# Patient Record
Sex: Male | Born: 1940 | Race: White | Hispanic: No | Marital: Married | Smoking: Never smoker
Health system: Southern US, Community
[De-identification: ages and names within clinical notes are randomized; demographics above are authoritative.]

## PROBLEM LIST (undated history)

## (undated) DIAGNOSIS — I2510 Atherosclerotic heart disease of native coronary artery without angina pectoris: Secondary | ICD-10-CM

## (undated) DIAGNOSIS — L280 Lichen simplex chronicus: Secondary | ICD-10-CM

## (undated) DIAGNOSIS — G459 Transient cerebral ischemic attack, unspecified: Secondary | ICD-10-CM

## (undated) DIAGNOSIS — Z86010 Personal history of colonic polyps: Secondary | ICD-10-CM

## (undated) DIAGNOSIS — Z951 Presence of aortocoronary bypass graft: Secondary | ICD-10-CM

## (undated) DIAGNOSIS — I679 Cerebrovascular disease, unspecified: Secondary | ICD-10-CM

## (undated) DIAGNOSIS — I10 Essential (primary) hypertension: Secondary | ICD-10-CM

## (undated) DIAGNOSIS — N62 Hypertrophy of breast: Secondary | ICD-10-CM

## (undated) DIAGNOSIS — E663 Overweight: Secondary | ICD-10-CM

## (undated) DIAGNOSIS — M25519 Pain in unspecified shoulder: Secondary | ICD-10-CM

## (undated) DIAGNOSIS — E119 Type 2 diabetes mellitus without complications: Secondary | ICD-10-CM

## (undated) DIAGNOSIS — I639 Cerebral infarction, unspecified: Secondary | ICD-10-CM

## (undated) DIAGNOSIS — L910 Hypertrophic scar: Secondary | ICD-10-CM

## (undated) DIAGNOSIS — E7800 Pure hypercholesterolemia, unspecified: Secondary | ICD-10-CM

## (undated) DIAGNOSIS — I6529 Occlusion and stenosis of unspecified carotid artery: Secondary | ICD-10-CM

## (undated) DIAGNOSIS — N289 Disorder of kidney and ureter, unspecified: Secondary | ICD-10-CM

## (undated) HISTORY — DX: Presence of aortocoronary bypass graft: Z95.1

## (undated) HISTORY — DX: Cerebrovascular disease, unspecified: I67.9

## (undated) HISTORY — DX: Pure hypercholesterolemia, unspecified: E78.00

## (undated) HISTORY — DX: Overweight: E66.3

## (undated) HISTORY — DX: Transient cerebral ischemic attack, unspecified: G45.9

## (undated) HISTORY — DX: Pain in unspecified shoulder: M25.519

## (undated) HISTORY — PX: EYE SURGERY: SHX253

## (undated) HISTORY — PX: CORONARY ANGIOPLASTY: SHX604

## (undated) HISTORY — DX: Disorder of kidney and ureter, unspecified: N28.9

## (undated) HISTORY — DX: Personal history of colonic polyps: Z86.010

## (undated) HISTORY — PX: COLONOSCOPY: SHX174

## (undated) HISTORY — DX: Hypertrophic scar: L91.0

## (undated) HISTORY — DX: Essential (primary) hypertension: I10

## (undated) HISTORY — DX: Lichen simplex chronicus: L28.0

## (undated) HISTORY — DX: Atherosclerotic heart disease of native coronary artery without angina pectoris: I25.10

## (undated) HISTORY — PX: KELOID EXCISION: SHX1856

## (undated) HISTORY — DX: Occlusion and stenosis of unspecified carotid artery: I65.29

## (undated) HISTORY — DX: Hypertrophy of breast: N62

---

## 1988-01-01 HISTORY — PX: PILONIDAL CYST EXCISION: SHX744

## 1998-04-20 ENCOUNTER — Ambulatory Visit (HOSPITAL_COMMUNITY): Admission: RE | Admit: 1998-04-20 | Discharge: 1998-04-20 | Payer: Self-pay | Admitting: *Deleted

## 2001-08-20 ENCOUNTER — Ambulatory Visit (HOSPITAL_COMMUNITY): Admission: RE | Admit: 2001-08-20 | Discharge: 2001-08-20 | Payer: Self-pay | Admitting: Gastroenterology

## 2004-01-16 ENCOUNTER — Inpatient Hospital Stay (HOSPITAL_COMMUNITY): Admission: EM | Admit: 2004-01-16 | Discharge: 2004-01-18 | Payer: Self-pay | Admitting: Internal Medicine

## 2004-01-20 ENCOUNTER — Ambulatory Visit (HOSPITAL_COMMUNITY): Admission: RE | Admit: 2004-01-20 | Discharge: 2004-01-20 | Payer: Self-pay | Admitting: *Deleted

## 2004-02-01 HISTORY — PX: CARDIAC CATHETERIZATION: SHX172

## 2004-02-01 HISTORY — PX: CORONARY ARTERY BYPASS GRAFT: SHX141

## 2004-02-03 ENCOUNTER — Inpatient Hospital Stay (HOSPITAL_COMMUNITY): Admission: RE | Admit: 2004-02-03 | Discharge: 2004-02-09 | Payer: Self-pay | Admitting: Cardiothoracic Surgery

## 2004-02-28 ENCOUNTER — Encounter (HOSPITAL_COMMUNITY): Admission: RE | Admit: 2004-02-28 | Discharge: 2004-05-28 | Payer: Self-pay | Admitting: *Deleted

## 2004-12-19 ENCOUNTER — Ambulatory Visit: Payer: Self-pay | Admitting: *Deleted

## 2004-12-31 DIAGNOSIS — G459 Transient cerebral ischemic attack, unspecified: Secondary | ICD-10-CM

## 2004-12-31 HISTORY — DX: Transient cerebral ischemic attack, unspecified: G45.9

## 2005-01-15 ENCOUNTER — Ambulatory Visit: Payer: Self-pay | Admitting: *Deleted

## 2005-02-21 ENCOUNTER — Ambulatory Visit: Payer: Self-pay | Admitting: Pulmonary Disease

## 2005-03-23 ENCOUNTER — Ambulatory Visit: Payer: Self-pay | Admitting: Pulmonary Disease

## 2005-03-30 ENCOUNTER — Ambulatory Visit: Payer: Self-pay | Admitting: Pulmonary Disease

## 2005-06-26 ENCOUNTER — Ambulatory Visit: Payer: Self-pay | Admitting: Cardiovascular Disease

## 2006-05-24 ENCOUNTER — Ambulatory Visit: Payer: Self-pay | Admitting: Cardiovascular Disease

## 2006-05-31 ENCOUNTER — Ambulatory Visit: Payer: Self-pay | Admitting: Cardiovascular Disease

## 2006-08-29 ENCOUNTER — Ambulatory Visit: Payer: Self-pay | Admitting: Gastroenterology

## 2006-10-08 ENCOUNTER — Ambulatory Visit: Payer: Self-pay | Admitting: Gastroenterology

## 2006-10-08 ENCOUNTER — Encounter (INDEPENDENT_AMBULATORY_CARE_PROVIDER_SITE_OTHER): Payer: Self-pay | Admitting: *Deleted

## 2006-10-08 DIAGNOSIS — Z860100 Personal history of colon polyps, unspecified: Secondary | ICD-10-CM

## 2006-10-08 HISTORY — DX: Personal history of colon polyps, unspecified: Z86.0100

## 2006-10-08 HISTORY — DX: Personal history of colonic polyps: Z86.010

## 2006-11-18 ENCOUNTER — Ambulatory Visit: Payer: Self-pay | Admitting: Pulmonary Disease

## 2007-01-30 ENCOUNTER — Ambulatory Visit: Payer: Self-pay | Admitting: Pulmonary Disease

## 2007-02-13 ENCOUNTER — Encounter: Admission: RE | Admit: 2007-02-13 | Discharge: 2007-02-13 | Payer: Self-pay | Admitting: Surgery

## 2007-05-28 ENCOUNTER — Ambulatory Visit: Payer: Self-pay | Admitting: Cardiovascular Disease

## 2007-05-28 LAB — CONVERTED CEMR LAB
ALT: 31 units/L (ref 0–40)
AST: 28 units/L (ref 0–37)
Albumin: 3.9 g/dL (ref 3.5–5.2)
Alkaline Phosphatase: 70 units/L (ref 39–117)
Bilirubin, Direct: 0.1 mg/dL (ref 0.0–0.3)
Cholesterol: 125 mg/dL (ref 0–200)
HDL: 23.7 mg/dL — ABNORMAL LOW (ref >39.0)
LDL Cholesterol: 67 mg/dL (ref 0–99)
Total Bilirubin: 1.2 mg/dL (ref 0.3–1.2)
Total CHOL/HDL Ratio: 5.3
Total Protein: 7 g/dL (ref 6.0–8.3)
Triglycerides: 171 mg/dL — ABNORMAL HIGH (ref 0–149)
VLDL: 34 mg/dL (ref 0–40)

## 2007-06-19 ENCOUNTER — Ambulatory Visit: Payer: Self-pay | Admitting: Cardiovascular Disease

## 2007-12-01 ENCOUNTER — Telehealth (INDEPENDENT_AMBULATORY_CARE_PROVIDER_SITE_OTHER): Payer: Self-pay | Admitting: *Deleted

## 2007-12-10 ENCOUNTER — Ambulatory Visit: Payer: Self-pay | Admitting: Pulmonary Disease

## 2007-12-16 ENCOUNTER — Ambulatory Visit: Payer: Self-pay | Admitting: Pulmonary Disease

## 2007-12-16 LAB — CONVERTED CEMR LAB
ALT: 26 units/L (ref 0–53)
AST: 24 units/L (ref 0–37)
Albumin: 3.8 g/dL (ref 3.5–5.2)
Alkaline Phosphatase: 72 units/L (ref 39–117)
BUN: 14 mg/dL (ref 6–23)
Basophils Absolute: 0 10*3/uL (ref 0.0–0.1)
Basophils Relative: 0.1 % (ref 0.0–1.0)
Bilirubin Urine: NEGATIVE
Bilirubin, Direct: 0.3 mg/dL (ref 0.0–0.3)
CO2: 31 meq/L (ref 19–32)
CRP, High Sensitivity: 2 (ref 0.00–5.00)
Calcium: 8.9 mg/dL (ref 8.4–10.5)
Chloride: 104 meq/L (ref 96–112)
Cholesterol: 107 mg/dL (ref 0–200)
Creatinine, Ser: 1 mg/dL (ref 0.4–1.5)
Eosinophils Absolute: 0.1 10*3/uL (ref 0.0–0.6)
Eosinophils Relative: 1.6 % (ref 0.0–5.0)
GFR calc Af Amer: 97 mL/min
GFR calc non Af Amer: 80 mL/min
Glucose, Bld: 113 mg/dL — ABNORMAL HIGH (ref 70–99)
HCT: 45.5 % (ref 39.0–52.0)
HDL: 23 mg/dL — ABNORMAL LOW (ref >39.0)
Hemoglobin, Urine: NEGATIVE
Hemoglobin: 15.8 g/dL (ref 13.0–17.0)
Hgb A1c MFr Bld: 5.9 % (ref 4.6–6.0)
Ketones, ur: NEGATIVE mg/dL
LDL Cholesterol: 58 mg/dL (ref 0–99)
Leukocytes, UA: NEGATIVE
Lymphocytes Relative: 23.3 % (ref 12.0–46.0)
MCHC: 34.8 g/dL (ref 30.0–36.0)
MCV: 99.9 fL (ref 78.0–100.0)
Monocytes Absolute: 0.7 10*3/uL (ref 0.2–0.7)
Monocytes Relative: 10.3 % (ref 3.0–11.0)
Neutro Abs: 4.1 10*3/uL (ref 1.4–7.7)
Neutrophils Relative %: 64.7 % (ref 43.0–77.0)
Nitrite: NEGATIVE
PSA: 2.4 ng/mL (ref 0.10–4.00)
Platelets: 171 10*3/uL (ref 150–400)
Potassium: 4 meq/L (ref 3.5–5.1)
RBC: 4.56 M/uL (ref 4.22–5.81)
RDW: 13 % (ref 11.5–14.6)
Sodium: 141 meq/L (ref 135–145)
Specific Gravity, Urine: 1.015 (ref 1.000–1.03)
TSH: 1.93 microintl units/mL (ref 0.35–5.50)
Total Bilirubin: 1.2 mg/dL (ref 0.3–1.2)
Total CHOL/HDL Ratio: 4.7
Total Protein, Urine: NEGATIVE mg/dL
Total Protein: 7 g/dL (ref 6.0–8.3)
Triglycerides: 131 mg/dL (ref 0–149)
Urine Glucose: NEGATIVE mg/dL
Urobilinogen, UA: 0.2 (ref 0.0–1.0)
VLDL: 26 mg/dL (ref 0–40)
WBC: 6.4 10*3/uL (ref 4.5–10.5)
pH: 6 (ref 5.0–8.0)

## 2008-01-10 DIAGNOSIS — R7301 Impaired fasting glucose: Secondary | ICD-10-CM

## 2008-01-10 DIAGNOSIS — E119 Type 2 diabetes mellitus without complications: Secondary | ICD-10-CM | POA: Insufficient documentation

## 2008-01-10 DIAGNOSIS — L910 Hypertrophic scar: Secondary | ICD-10-CM | POA: Insufficient documentation

## 2008-01-10 DIAGNOSIS — I10 Essential (primary) hypertension: Secondary | ICD-10-CM | POA: Insufficient documentation

## 2008-01-10 DIAGNOSIS — I872 Venous insufficiency (chronic) (peripheral): Secondary | ICD-10-CM | POA: Insufficient documentation

## 2008-01-10 DIAGNOSIS — D126 Benign neoplasm of colon, unspecified: Secondary | ICD-10-CM | POA: Insufficient documentation

## 2008-01-10 DIAGNOSIS — M25519 Pain in unspecified shoulder: Secondary | ICD-10-CM | POA: Insufficient documentation

## 2008-01-10 DIAGNOSIS — I2510 Atherosclerotic heart disease of native coronary artery without angina pectoris: Secondary | ICD-10-CM | POA: Insufficient documentation

## 2008-01-10 DIAGNOSIS — N62 Hypertrophy of breast: Secondary | ICD-10-CM | POA: Insufficient documentation

## 2008-01-10 DIAGNOSIS — L280 Lichen simplex chronicus: Secondary | ICD-10-CM | POA: Insufficient documentation

## 2008-01-29 ENCOUNTER — Telehealth: Payer: Self-pay | Admitting: Pulmonary Disease

## 2008-03-11 ENCOUNTER — Telehealth: Payer: Self-pay | Admitting: Pulmonary Disease

## 2008-04-30 HISTORY — PX: KELOID EXCISION: SHX1856

## 2008-06-02 ENCOUNTER — Ambulatory Visit: Payer: Self-pay | Admitting: Cardiovascular Disease

## 2008-06-02 LAB — CONVERTED CEMR LAB
ALT: 27 units/L (ref 0–53)
AST: 28 units/L (ref 0–37)
Albumin: 3.7 g/dL (ref 3.5–5.2)
Alkaline Phosphatase: 68 units/L (ref 39–117)
Bilirubin, Direct: 0.1 mg/dL (ref 0.0–0.3)
Cholesterol: 107 mg/dL (ref 0–200)
HDL: 21.9 mg/dL — ABNORMAL LOW (ref >39.0)
LDL Cholesterol: 56 mg/dL (ref 0–99)
Total Bilirubin: 1.2 mg/dL (ref 0.3–1.2)
Total CHOL/HDL Ratio: 4.9
Total Protein: 6.6 g/dL (ref 6.0–8.3)
Triglycerides: 144 mg/dL (ref 0–149)
VLDL: 29 mg/dL (ref 0–40)

## 2008-06-07 ENCOUNTER — Ambulatory Visit: Payer: Self-pay | Admitting: Cardiovascular Disease

## 2009-01-27 ENCOUNTER — Telehealth: Payer: Self-pay | Admitting: Pulmonary Disease

## 2009-01-31 ENCOUNTER — Ambulatory Visit: Payer: Self-pay | Admitting: Pulmonary Disease

## 2009-02-03 ENCOUNTER — Ambulatory Visit: Payer: Self-pay | Admitting: Pulmonary Disease

## 2009-02-04 DIAGNOSIS — E663 Overweight: Secondary | ICD-10-CM | POA: Insufficient documentation

## 2009-02-04 LAB — CONVERTED CEMR LAB
ALT: 28 units/L (ref 0–53)
AST: 27 units/L (ref 0–37)
Albumin: 3.9 g/dL (ref 3.5–5.2)
Alkaline Phosphatase: 74 units/L (ref 39–117)
BUN: 13 mg/dL (ref 6–23)
Basophils Absolute: 0 10*3/uL (ref 0.0–0.1)
Basophils Relative: 0.3 % (ref 0.0–3.0)
Bilirubin Urine: NEGATIVE
Bilirubin, Direct: 0.2 mg/dL (ref 0.0–0.3)
CO2: 31 meq/L (ref 19–32)
CRP, High Sensitivity: <1 — ABNORMAL LOW (ref 0.00–5.00)
Calcium: 9.1 mg/dL (ref 8.4–10.5)
Chloride: 103 meq/L (ref 96–112)
Cholesterol: 119 mg/dL (ref 0–200)
Creatinine, Ser: 0.9 mg/dL (ref 0.4–1.5)
Eosinophils Absolute: 0.1 10*3/uL (ref 0.0–0.7)
Eosinophils Relative: 1.3 % (ref 0.0–5.0)
GFR calc Af Amer: 109 mL/min
GFR calc non Af Amer: 90 mL/min
Glucose, Bld: 114 mg/dL — ABNORMAL HIGH (ref 70–99)
HCT: 49.1 % (ref 39.0–52.0)
HDL: 24.1 mg/dL — ABNORMAL LOW (ref >39.0)
Hemoglobin, Urine: NEGATIVE
Hemoglobin: 17 g/dL (ref 13.0–17.0)
Ketones, ur: NEGATIVE mg/dL
LDL Cholesterol: 63 mg/dL (ref 0–99)
Leukocytes, UA: NEGATIVE
Lymphocytes Relative: 20.1 % (ref 12.0–46.0)
MCHC: 34.6 g/dL (ref 30.0–36.0)
MCV: 101.7 fL — ABNORMAL HIGH (ref 78.0–100.0)
Monocytes Absolute: 0.7 10*3/uL (ref 0.1–1.0)
Monocytes Relative: 10.2 % (ref 3.0–12.0)
Neutro Abs: 5 10*3/uL (ref 1.4–7.7)
Neutrophils Relative %: 68.1 % (ref 43.0–77.0)
Nitrite: NEGATIVE
PSA: 1.16 ng/mL (ref 0.10–4.00)
Platelets: 183 10*3/uL (ref 150–400)
Potassium: 4.3 meq/L (ref 3.5–5.1)
RBC: 4.83 M/uL (ref 4.22–5.81)
RDW: 13.1 % (ref 11.5–14.6)
Sodium: 139 meq/L (ref 135–145)
Specific Gravity, Urine: 1.015 (ref 1.000–1.03)
TSH: 2.56 microintl units/mL (ref 0.35–5.50)
Total Bilirubin: 1 mg/dL (ref 0.3–1.2)
Total CHOL/HDL Ratio: 4.9
Total Protein, Urine: NEGATIVE mg/dL
Total Protein: 7.1 g/dL (ref 6.0–8.3)
Triglycerides: 158 mg/dL — ABNORMAL HIGH (ref 0–149)
Urine Glucose: 100 mg/dL — AB
Urobilinogen, UA: 0.2 (ref 0.0–1.0)
VLDL: 32 mg/dL (ref 0–40)
Vit D, 1,25-Dihydroxy: 14 — ABNORMAL LOW (ref 30–89)
WBC: 7.2 10*3/uL (ref 4.5–10.5)
pH: 6 (ref 5.0–8.0)

## 2009-06-01 ENCOUNTER — Ambulatory Visit: Payer: Self-pay | Admitting: Cardiovascular Disease

## 2009-06-03 ENCOUNTER — Inpatient Hospital Stay (HOSPITAL_COMMUNITY): Admission: EM | Admit: 2009-06-03 | Discharge: 2009-06-04 | Payer: Self-pay | Admitting: Emergency Medicine

## 2009-06-03 ENCOUNTER — Ambulatory Visit: Payer: Self-pay | Admitting: Pulmonary Disease

## 2009-06-08 ENCOUNTER — Ambulatory Visit: Payer: Self-pay | Admitting: Vascular Surgery

## 2009-06-27 ENCOUNTER — Telehealth: Payer: Self-pay | Admitting: Pulmonary Disease

## 2009-06-30 ENCOUNTER — Ambulatory Visit: Payer: Self-pay | Admitting: Pulmonary Disease

## 2009-06-30 DIAGNOSIS — G459 Transient cerebral ischemic attack, unspecified: Secondary | ICD-10-CM | POA: Insufficient documentation

## 2009-06-30 DIAGNOSIS — I639 Cerebral infarction, unspecified: Secondary | ICD-10-CM | POA: Insufficient documentation

## 2009-06-30 DIAGNOSIS — I679 Cerebrovascular disease, unspecified: Secondary | ICD-10-CM | POA: Insufficient documentation

## 2009-11-17 ENCOUNTER — Ambulatory Visit: Payer: Self-pay | Admitting: Pulmonary Disease

## 2009-11-20 DIAGNOSIS — E559 Vitamin D deficiency, unspecified: Secondary | ICD-10-CM | POA: Insufficient documentation

## 2009-11-20 LAB — CONVERTED CEMR LAB
BUN: 13 mg/dL (ref 6–23)
CO2: 30 meq/L (ref 19–32)
Calcium: 9.3 mg/dL (ref 8.4–10.5)
Chloride: 105 meq/L (ref 96–112)
Cholesterol: 115 mg/dL (ref 0–200)
Creatinine, Ser: 0.9 mg/dL (ref 0.4–1.5)
GFR calc non Af Amer: 89.73 mL/min (ref >60)
Glucose, Bld: 109 mg/dL — ABNORMAL HIGH (ref 70–99)
HDL: 27.9 mg/dL — ABNORMAL LOW (ref >39.00)
Hgb A1c MFr Bld: 5.9 % (ref 4.6–6.5)
LDL Cholesterol: 62 mg/dL (ref 0–99)
Potassium: 4.4 meq/L (ref 3.5–5.1)
Sodium: 142 meq/L (ref 135–145)
Total CHOL/HDL Ratio: 4
Triglycerides: 128 mg/dL (ref 0.0–149.0)
VLDL: 25.6 mg/dL (ref 0.0–40.0)

## 2010-05-08 ENCOUNTER — Telehealth: Payer: Self-pay | Admitting: Pulmonary Disease

## 2010-05-10 ENCOUNTER — Ambulatory Visit: Payer: Self-pay | Admitting: Pulmonary Disease

## 2010-05-16 ENCOUNTER — Ambulatory Visit: Payer: Self-pay | Admitting: Pulmonary Disease

## 2010-05-21 LAB — CONVERTED CEMR LAB
ALT: 30 units/L (ref 0–53)
AST: 32 units/L (ref 0–37)
Albumin: 4 g/dL (ref 3.5–5.2)
Alkaline Phosphatase: 65 units/L (ref 39–117)
BUN: 21 mg/dL (ref 6–23)
Basophils Absolute: 0 10*3/uL (ref 0.0–0.1)
Basophils Relative: 0.3 % (ref 0.0–3.0)
Bilirubin, Direct: 0.2 mg/dL (ref 0.0–0.3)
CO2: 31 meq/L (ref 19–32)
CRP, High Sensitivity: 0.6 (ref 0.00–5.00)
Calcium: 9.2 mg/dL (ref 8.4–10.5)
Chloride: 104 meq/L (ref 96–112)
Cholesterol: 119 mg/dL (ref 0–200)
Creatinine, Ser: 0.9 mg/dL (ref 0.4–1.5)
Eosinophils Absolute: 0.1 10*3/uL (ref 0.0–0.7)
Eosinophils Relative: 1.8 % (ref 0.0–5.0)
GFR calc non Af Amer: 93.17 mL/min (ref >60)
Glucose, Bld: 107 mg/dL — ABNORMAL HIGH (ref 70–99)
HCT: 46.4 % (ref 39.0–52.0)
HDL: 27.2 mg/dL — ABNORMAL LOW (ref >39.00)
Hemoglobin: 16.1 g/dL (ref 13.0–17.0)
LDL Cholesterol: 63 mg/dL (ref 0–99)
Lymphocytes Relative: 23.8 % (ref 12.0–46.0)
Lymphs Abs: 1.5 10*3/uL (ref 0.7–4.0)
MCHC: 34.6 g/dL (ref 30.0–36.0)
MCV: 101.2 fL — ABNORMAL HIGH (ref 78.0–100.0)
Monocytes Absolute: 0.6 10*3/uL (ref 0.1–1.0)
Monocytes Relative: 10 % (ref 3.0–12.0)
Neutro Abs: 4.1 10*3/uL (ref 1.4–7.7)
Neutrophils Relative %: 64.1 % (ref 43.0–77.0)
PSA: 1.24 ng/mL (ref 0.10–4.00)
Platelets: 185 10*3/uL (ref 150.0–400.0)
Potassium: 4.6 meq/L (ref 3.5–5.1)
RBC: 4.59 M/uL (ref 4.22–5.81)
RDW: 14.1 % (ref 11.5–14.6)
Sodium: 141 meq/L (ref 135–145)
TSH: 2.25 microintl units/mL (ref 0.35–5.50)
Total Bilirubin: 1.3 mg/dL — ABNORMAL HIGH (ref 0.3–1.2)
Total CHOL/HDL Ratio: 4
Total Protein: 6.8 g/dL (ref 6.0–8.3)
Triglycerides: 144 mg/dL (ref 0.0–149.0)
VLDL: 28.8 mg/dL (ref 0.0–40.0)
WBC: 6.4 10*3/uL (ref 4.5–10.5)

## 2010-06-02 ENCOUNTER — Ambulatory Visit: Payer: Self-pay | Admitting: Cardiovascular Disease

## 2010-06-09 ENCOUNTER — Ambulatory Visit: Payer: Self-pay | Admitting: Vascular Surgery

## 2010-11-14 ENCOUNTER — Ambulatory Visit: Payer: Self-pay | Admitting: Pulmonary Disease

## 2010-11-14 LAB — CONVERTED CEMR LAB
BUN: 17 mg/dL (ref 6–23)
CO2: 29 meq/L (ref 19–32)
Calcium: 8.6 mg/dL (ref 8.4–10.5)
Chloride: 104 meq/L (ref 96–112)
Cholesterol: 119 mg/dL (ref 0–200)
Creatinine, Ser: 0.9 mg/dL (ref 0.4–1.5)
GFR calc non Af Amer: 85.08 mL/min (ref >60)
Glucose, Bld: 108 mg/dL — ABNORMAL HIGH (ref 70–99)
HDL: 30.7 mg/dL — ABNORMAL LOW (ref >39.00)
LDL Cholesterol: 66 mg/dL (ref 0–99)
Potassium: 4.3 meq/L (ref 3.5–5.1)
Sodium: 138 meq/L (ref 135–145)
Total CHOL/HDL Ratio: 4
Triglycerides: 113 mg/dL (ref 0.0–149.0)
VLDL: 22.6 mg/dL (ref 0.0–40.0)

## 2011-01-20 ENCOUNTER — Encounter: Payer: Self-pay | Admitting: Cardiothoracic Surgery

## 2011-02-01 NOTE — Progress Notes (Signed)
Summary: Plastic Surgeon referral needed  Phone Note Call from Patient Call back at Home Phone [phone]   Caller: Patient Reason for Call: Talk to Nurse Summary of Call: patient said last year when he seen dr young, he told him that he may need to go to a plastic surgeon to get the scar on his chest looked at. Dr young told him he would reffer him if he needed to. and he was now wanting a refferal.  cr Initial call taken by: Valinda Hoar,  January 29, 2008 1:03 PM  Follow-up for Phone Call        [facility] with wife.  Follow-up by: Marinus Maw,  January 29, 2008 1:31 PM  Additional Follow-up for Phone Call Additional follow up Details #1::        Wants plastic surgeon referral. [facility] to set up. Additional Follow-up by: Marinus Maw,  January 29, 2008 1:47 PM    Additional Follow-up for Phone Call Additional follow up Details #2::    Pt is a pt of dr Kriste Basque has never seen dr young. Dr. Kriste Basque states it was ok to refer for plastic surgery eval. Appt scheduled for 02/18/08 at 10:30 with Dr. Stephens November. Mailed appt card and dr Stephens November information. Pt aware of appt. Follow-up by: Alfonso Ramus,  January 30, 2008 9:41 AM

## 2011-02-01 NOTE — Assessment & Plan Note (Signed)
Summary: f1y/dm  Medications Added DIOVAN 160 MG TABS (VALSARTAN) take one tablet once daily      Allergies Added:   CC:  1 yr follow up.  History of Present Illness: [name] [name] seen today in followup.  His known coronary disease with previous coronary bypass surgery in 2005.  he has normal LV function.  His risk factors will modify.  Not having significant chest pain PND orthopnea is really active.  He has 3 young grandchildren and is quite active with him.  He was at the [facility] baseball tournament this weekend.  She is Dr. Elroy Channel for his primary care needs.  He had a recent physical which is fine.  Blood pressure medicine has been switched to Diovan do to insurance reasons.  He is taking an aspirin a day.   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 Take 1 Tablet By Mouth Once A Day 2)  Lipitor 20 Mg Tabs (Atorvastatin Calcium) .... Take As Directed... 3)  Viagra 100 Mg  Tabs (Sildenafil Citrate) .... Use As Directed 4)  Diovan 160 Mg Tabs (Valsartan) .... Take One Tablet Once Daily  Allergies (verified): 1)  ! Niacin (Niacin)  Past History:  Past Medical History: Last updated: 02/03/2009  HYPERTENSION (ICD-401.9) CORONARY ARTERY DISEASE (ICD-414.00) VENOUS INSUFFICIENCY (ICD-459.81) HYPERCHOLESTEROLEMIA (ICD-272.0) DIABETES MELLITUS, BORDERLINE (ICD-790.29) OVERWEIGHT (ICD-278.02) COLONIC POLYPS (ICD-211.3) SHOULDER PAIN (ICD-719.41) GYNECOMASTIA,  UNILATERAL (ICD-611.1) KELOID (ICD-701.4) NEURODERMATITIS (ICD-698.3)  Past Surgical History: Last updated: 02/03/2009  S/P Pilonidal Cystectomy 1989 S/P 4 Vessel CABG 2/05 by DrVanTrigt S/P keloid surg on chest scar 5/09 by DrHolderness  Family History: Last updated: 06/29/09  Father died of lung cancer at age 68.  Mother is alive at  age 68, MI at 68, has two brothers, one is a diabetic, one sister.  Social History: Last updated: 2009-06-29  The patient is married.  He is retired, has one child, does  not smoke, does not drink.  Review of Systems       Denies fever, malais, weight loss, blurry vision, decreased visual acuity, cough, sputum, SOB, hemoptysis, pleuritic pain, palpitaitons, heartburn, abdominal pain, melena, lower extremity edema, claudication, or rash. All other systems reviewed and negative  Vital Signs:  Patient profile:   70 year old male Height:      74 inches Weight:      237 pounds BMI:     30.54 Pulse rate:   84 / minute Pulse rhythm:   regular BP sitting:   144 / 76  (left arm) Cuff size:   large  Vitals Entered By: Judithe Modest CMA 06/29/09 10:40 AM)  Physical Exam  General:  Affect appropriate Healthy:  appears stated age HEENT: normal Neck supple with no adenopathy JVP normal no bruits no thyromegaly Lungs clear with no wheezing and good diaphragmatic motion Heart:  S1/S2 no murmur,rub, gallop or click PMI normal Abdomen: benighn, BS positve, no tenderness, no AAA no bruit.  No HSM or HJR Distal pulses intact with no bruits No edema Neuro non-focal  Skin warm and dry    Impression & Recommendations:  Problem # 1:  CORONARY ARTERY DISEASE (ICD-414.00) CABG 2005.  stable no angina His updated medication list for this problem includes:    Adult Aspirin Low Strength 81 Mg Tbdp (Aspirin) .[name]... Take 1 tablet by mouth once a day  Orders: EKG w/ Interpretation (93000)  Problem # 2:  HYPERTENSION (ICD-401.9) Will update  med list and stop avapro The following medications were removed from the medication list:    Avapro 300 Mg Tabs (Irbesartan) .[name]... Take 1 tablet by mouth once a day His updated medication list for this problem includes:    Adult Aspirin Low Strength 81 Mg Tbdp (Aspirin) .[name]... Take 1 tablet by mouth once a day    Diovan 160 Mg Tabs (Valsartan) .[name]... Take one tablet once daily  Problem # 3:  HYPERCHOLESTEROLEMIA (ICD-272.0) At target with no side effects His updated medication list for this problem includes:    Lipitor 20 Mg Tabs (Atorvastatin calcium) .[name]... Take as directed...  CHOL: 119 (01/31/2009)   LDL: 63 (01/31/2009)   HDL: 24.1 (01/31/2009)   TG: 158 (01/31/2009) CRP: < 1.00 mg/L (01/31/2009)     Patient Instructions: 1)  F/U Nishan 1 year

## 2011-02-01 NOTE — Progress Notes (Signed)
Summary: rx sub  Phone Note Call from Patient   Caller: Patient Call For: nadel Summary of Call: req to speak to t. davis re: sub for toprol. says [facility] [address] [facility] not received a response to their "many" faxes.   Initial call taken by: Tivis Ringer,  March 11, 2008 1:17 PM  Follow-up for Phone Call        Pt was taking toprol xl 50 mg 1/2 once daily  [facility] [address].  Please advise  Follow-up by: Vernie Murders,  March 11, 2008 3:01 PM  Additional Follow-up for Phone Call Additional follow up Details #1::        per SN  we will change pt to atenolol 50mg .  this has been sent to his pharmacy and he is aware. Additional Follow-up by: Marijo File CMA,  March 11, 2008 3:18 PM    New/Updated Medications: ATENOLOL 50 MG  TABS (ATENOLOL) take as directed   Prescriptions: ATENOLOL 50 MG  TABS (ATENOLOL) take as directed  #30 x 5   Entered by:   Marijo File CMA   Authorized by:   Michele Mcalpine MD   Signed by:   Marijo File CMA on 03/11/2008   Method used:   Electronically [facility] to ...       [facility]  [address] [id number]       [address]       [place], [place]  [address]       Ph: [phone] or [phone]       Fax: [phone]   RxID:   [id number]

## 2011-02-01 NOTE — Progress Notes (Signed)
Summary: set up labs asap   Phone Note Call from Patient   Caller: Patient Call For: nadel Summary of Call: please set up labs for pt's cpx. he would like to come in asap to have these done. please add CRP test per pt.  cpx is scheduled for 02/03/09. [phone].  Initial call taken by: Tivis Ringer,  January 27, 2009 2:24 PM  Follow-up for Phone Call        Please advise what labs to order and I will put in idx Vernie Murders  January 27, 2009 2:37 PM   Additional Follow-up for Phone Call Additional follow up Details #1::        Pt coming in on Monday Feb 1st for lab work. Reynaldo Minium CMA  January 27, 2009 4:47 PM   Orders in IDX.

## 2011-02-01 NOTE — Assessment & Plan Note (Signed)
Summary: 6 months/apc   CC:  6 month ROV & review of mult medical problems....  History of Present Illness: 70 y/o WM here for a follow up visit... he has multiple medical problems as noted below...     ~  followed by [name] for Cardiology- CAD, s/p CABG... he stopped his Atenolol due to side effects and incr the Avapro to 300mg /d... pt feels much better off the BBlocker...  ~  May09:  he had keloid surg by [facility] for the large keloid on the sternal wound... now much improved and he is quite satisfied...   ~  Feb10: doing well overall x difficult time dieting and exercising- as a result weight is the same, & HDL Chol is still low @ 24... feeling better off the BBlocker and BP is good on the incr dose of Avapro...  ~  Jun10:  Hosp w/ TIA w/ left face & arm symptoms that resolved quickly... CT showed small vessel disease;  MRI showed bilat remote lacunar infarcts, no acute infarct, +sm vessel dis;  MRA showed intracranial atherosclerotic changes, & ?focal stenosis (?70%) of left ICA in the neck... 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  he states that he stopped the Plavix after just several days due to swelling in his feet that resolved off the Plavix Rx- we discussed this and I strongly rec that he restart the Plavix due to his recent TIA...  ~  Nov10:  he is taking his ASA + PLAX and stable... he notes BP well controlled on Rx w/ home BP monitoring & no CP, palpit, SOB, etc... he's already had the 2010 flu vaccine & due for f/u labs today.   ~  May 16, 2010:  still not dieting or exercising & wt stable  ~233#> we discussed needed diet + exercise program for weight reduction... BP controlled on the Diovan;  denies angina etc & due for f/u DrNishan soon;  he continues on the ASA/ Plavix w/o TIAs etc;  FLP looks good on Lip10 x low HDL & rec for incr exercise...    Current Problems:   HEALTH  MAINTENANCE -  he had TETANUS & PNEUMOVAX 10/00... we will go ahead w/ his f/u PNEUMOVAX at age 89 now... had the 2010 flu vaccine 10/10... he is up-to-date on his colonoscopy and prostate checks...  HYPERTENSION (ICD-401.9) - controlled on DIOVAN 160mg /d... BP's ave 130's/70's at home and measures 140/78 here today... he takes med regularly & tol well... denies HA, fatigue, visual changes, CP, palipit, dizziness, syncope, dyspnea, edema, etc...  ~  Jun09:  prev on Aten25mg - stopped due to side effects & feels much better off BBlockers.  ~  2/10:  Avapro300 changed to Diovan160 per his insurance company for $$$ reasons.  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 followed by [name] & last seen 6/10- note reviewed, stable, but adm 2d later w/ TIA as above... currently denies angina, palpit, SOB, edema, etc...  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 followed by [name].  VENOUS INSUFFICIENCY (ICD-459.81) - he is aware of need to elim salt, elevate legs, wear support hose.  HYPERCHOLESTEROLEMIA (ICD-272.0) - controlled on LIPITOR 20- 1/2 tab daily... he has tried NIACIN in the past and refuses to take it again due to headaches.  ~  FLP 5/08 showed TChol 125, TG 171, HDL 24, LDL 67...   ~  FLP 12/08 showed TChol 107, TG 131, HDL 23, LDL 58... rec- same med, better diet & exercise.  ~  FLP 2/10 on Lip10 (wt=235#) showed TChol 119, TG 158, HDL 24, LDL 63... rec- ditto  ~  FLP 11/10 on Lip10 (wt=236#) showed TChol 115, TG 128, HDL 28, LDL 62  ~  FLP 5/11 on Lip10 (wt=233#) showed TChol 119, TG 144, HDL 27, LDL 63  DIABETES MELLITUS, BORDERLINE (ICD-790.29) - FBS in the 110-125 range... diet Rx... he knows that there are diabetic meds in his future if he doesn't get the weight down!!!  ~  labs in hosp 6/10 showed BS= 109-113  ~  labs 11/10 showed BS= 109, A1c= 5.9  ~  labs 5/11 showed BS= 107  OVERWEIGHT (ICD-278.02) -  he is 6'2" tall and weighs  ~240# for a BMI of 31... we have discussed diet & exercise stategies.  COLONIC POLYPS (ICD-211.3) - last colonoscopy 10/07 by [name] showed several 5-6 mm polyps... path= tubular adenomas ... f/u planned 3 years.  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 Rx w/ PT.  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  ~  labs 2/10 showed Vit D level = 14... rec to start 2000 u daily.  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 he had plastic surg by [facility] 5/09 w/ improved scar.  NEURODERMATITIS (ICD-698.3)   Allergies: 1)  ! Niacin (Niacin)  Comments:  Nurse/Medical Assistant: The patient's medications and allergies were reviewed with the patient and were updated in the Medication and Allergy Lists.  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 Father died of lung cancer at age 21. Mother is alive at age 41, MI at 4, has two brothers, one is a diabetic, one sister.  Social History: Reviewed history from 06/30/2009 and no changes required. The patient  is married. He is retired,  has one child,  never smoked---dipped while playing ball years ago  does not drink.  Review of Systems      See HPI  The patient denies anorexia, fever, weight loss, weight gain, vision loss, decreased hearing, hoarseness, chest pain, syncope, dyspnea on exertion, peripheral edema, prolonged cough, headaches, hemoptysis, abdominal pain, melena, hematochezia, severe indigestion/heartburn, hematuria, incontinence, muscle weakness, suspicious skin lesions, transient blindness, difficulty walking, depression, unusual weight change, abnormal bleeding, enlarged lymph nodes, and angioedema.    Vital Signs:  Patient profile:   70 year old male Height:      74 inches Weight:      233 pounds BMI:     30.02 O2 Sat:      98 % on Room air Temp:     97.6 degrees F oral Pulse rate:   75 / minute BP sitting:   140 / 78  (left arm) Cuff size:   regular  Vitals Entered By: Randell Loop CMA (May 16, 2010 9:55 AM)  O2 Sat at Rest %:  98 O2 Flow:  Room air CC: 6 month ROV & review of mult medical problems... Is Patient Diabetic? No Pain Assessment Patient in pain? no      Comments meds updated today   Physical Exam  Additional Exam:  WD, Overweight, 70 y/o WM in NAD... GENERAL:  Alert & oriented; pleasant & cooperative. HEENT:  [place]/AT, EOM-wnl, PERRLA, Fundi-benign, EACs-clear, TMs-wnl, NOSE-clear, THROAT-clear & wnl. NECK:  Supple w/ fairROM; no JVD; normal carotid impulses w/o bruits; no thyromegaly or nodules palpated; no lymphadenopathy. CHEST:  Clear to P & A; without wheezes/ rales/ or rhonchi. HEART:  Median sternotomy scar w/ improved keloid, regular rhythm; without murmurs/ rubs/ or gallops. ABDOMEN:  Soft & nontender; normal bowel sounds; no organomegaly or masses detected. 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 EXT: without deformities, mild arthritic changes; + venous insuffic changes, no edema. NEURO:  CN's intact; motor testing normal;  sensory testing normal; gait normal & balance OK. DERM:  Keloid as noted, no other lesions...    MISC. Report  Procedure date:  05/10/2010  Findings:      Lipid Panel (LIPID)   Cholesterol               119 mg/dL                   2-956   Triglycerides             144.0 mg/dL                 2.1-308.6   HDL                  [L]  57.84 mg/dL                 >69.62   LDL Cholesterol           63 mg/dL                    9-52  BMP (METABOL)   Sodium                    141 mEq/L                   135-145   Potassium                 4.6 mEq/L                   3.5-5.1   Chloride                  104 mEq/L                   96-112   Carbon Dioxide            31 mEq/L                    19-32   Glucose              [H]  107 mg/dL                   84-13   BUN                       21 mg/dL  6-23   Creatinine                0.9 mg/dL                   4.7-8.2   Calcium                   9.2 mg/dL                   9.5-62.1   GFR                       93.17 mL/min                >60   Hepatic/Liver Function Panel (HEPATIC)   Total Bilirubin      [H]  1.3 mg/dL                   3.0-8.6   Direct Bilirubin          0.2 mg/dL                   5.7-8.4   Alkaline Phosphatase      65 U/L                      39-117   AST                       32 U/L                      0-37   ALT                       30 U/L                      0-53   Total Protein             6.8 g/dL                    6.9-6.2   Albumin                   4.0 g/dL                    9.5-2.8  Comments:      CBC Platelet w/Diff (CBCD)   White Cell Count          6.4 K/uL                    4.5-10.5   Red Cell Count            4.59 Mil/uL                 4.22-5.81   Hemoglobin                16.1 g/dL                   41.3-24.4   Hematocrit                46.4 %                      39.0-52.0   MCV                  [H]  101.2 fl  78.0-100.0   Platelet Count            185.0 K/uL                   150.0-400.0   Neutrophil %              64.1 %                      43.0-77.0   Lymphocyte %              23.8 %                      12.0-46.0   Monocyte %                10.0 %                      3.0-12.0   Eosinophils%              1.8 %                       0.0-5.0   Basophils %               0.3 %                       0.0-3.0  TSH (TSH)   FastTSH                   2.25 uIU/mL                 0.35-5.50  Prostate Specific Antigen (PSA)   PSA-Hyb                   1.24 ng/mL                  0.10-4.00   Full Range CRP (FCRP)   CRPH                      0.60 mg/L                   0.00-5.00   Impression & Recommendations:  Problem # 1:  HYPERTENSION (ICD-401.9) BP controlled on med... needs better diet, get weight down, etc... His updated medication list for this problem includes:    Diovan 160 Mg Tabs (Valsartan) .[name]... Take one tablet once daily  Orders: Prescription Created Electronically [phone])  Problem # 2:  CORONARY ARTERY DISEASE (ICD-414.00) Doing satis> no angina, due for f/u w/ drNishan soon... His updated medication list for this problem includes:    Adult Aspirin Low Strength 81 Mg Tbdp (Aspirin) .[name]... Take 1 tablet by mouth once a day    Plavix 75 Mg Tabs (Clopidogrel bisulfate) .[name]... Take 1 tab by mouth once daily...    Diovan 160 Mg Tabs (Valsartan) .[name]... 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 continue same... followed by Lyla Son.  Problem # 4:  HYPERCHOLESTEROLEMIA (ICD-272.0) FLP looks OK on Lip10 but HDL is low> needs better diet & exer (intol to Niacin). His updated medication list for this problem includes:    Lipitor 20 Mg Tabs (Atorvastatin calcium) .[name]... Take as directed...  Problem # 5:  DIABETES MELLITUS, BORDERLINE (ICD-790.29) Controlled on diet alone...  Problem # 6:  OVERWEIGHT (ICD-278.02) Must get on  diet, incr exercise, get weight down...  Problem # 7:  OTHER  MEDICAL PROBLEMS AS NOTED>>>  Complete Medication List: 1)  Adult Aspirin Low Strength 81 Mg Tbdp (Aspirin) .... Take 1 tablet by mouth once a day 2)  Plavix 75 Mg Tabs (Clopidogrel bisulfate) .... Take 1 tab by mouth once daily.Marland KitchenMarland Kitchen 3)  Diovan 160 Mg Tabs (Valsartan) .... Take one tablet once daily 4)  Lipitor 20 Mg Tabs (Atorvastatin calcium) .... Take as directed... 5)  Viagra 100 Mg Tabs (Sildenafil citrate) .... Use as directed 6)  Mens Multivitamin Plus Tabs (Multiple vitamins-minerals) .... Take 1 tab by mouth once daily.Marland KitchenMarland Kitchen 7)  Vitamin D3 2000 Unit Caps (Cholecalciferol) .... Take 1 cap by mouth once daily...  Other Orders: Tdap => 74yrs IM (16109) Admin 1st Vaccine (60454) Gastroenterology Referral (GI)  Patient Instructions: 1)  Today we updated your med list- see below.... 2)  We refilled your meds as discussed... 3)  You are due for a follow up w/ [name] & [name] in June... 4)  We will send a reminder to [name] requestion a review of your last colonoscopy & a determination of when the f/u study is due (now or next yr)... 5)  We gave you the TDAP tetanus vaccine today (good for 98yrs)... 6)  Let's get the weight down 15-20 lbs, and increase the exercise program... 7)  Call for any questions... 8)  Please schedule a follow-up appointment in 6 months. Prescriptions: VIAGRA 100 MG  TABS (SILDENAFIL CITRATE) use as directed  #10 x prn   Entered and Authorized by:   Michele Mcalpine MD   Signed by:   Michele Mcalpine MD on 05/16/2010   Method used:   Print then Give to Patient   RxID:   [id number] LIPITOR 20 MG TABS (ATORVASTATIN CALCIUM) take as directed...  #30 x prn   Entered and Authorized by:   Michele Mcalpine MD   Signed by:   Michele Mcalpine MD on 05/16/2010   Method used:   Print then Give to Patient   RxID:   [id number] DIOVAN 160 MG TABS (VALSARTAN) take one tablet once daily  #30 x prn   Entered and Authorized by:   Michele Mcalpine MD   Signed by:   Michele Mcalpine MD on 05/16/2010   Method used:   Print then Give to Patient   RxID:   [id number] PLAVIX 75 MG TABS (CLOPIDOGREL BISULFATE) take 1 tab by mouth once daily...  #30 x prn   Entered and Authorized by:   Michele Mcalpine MD   Signed by:   Michele Mcalpine MD on 05/16/2010   Method used:   Print then Give to Patient   RxID:   [id number]    Immunizations Administered:  Tetanus Vaccine:    Vaccine Type: Tdap    Site: left deltoid    Mfr: GlaxoSmithKline    Dose: 0.5 ml    Route: IM    Given by: Elray Buba RN    Exp. Date: 03/24/2012    Lot #: [id number]    VIS given: 11/18/07 version given May 16, 2010.

## 2011-02-01 NOTE — Progress Notes (Signed)
Summary: APPOINTMENT  Phone Note Call from Patient   Caller: Patient Call For: nadel Summary of Call: pt was told by Dr Kriste Basque TO [facility] AN OFFICE VISIT ONE MONTH AFTER 6/4 AND HE HAS NOTHING ON HIS [facility] Initial call taken by: Rickard Patience,  June 27, 2009 9:41 AM  Follow-up for Phone Call        please advise! Follow-up by: Carron Curie CMA,  June 27, 2009 9:58 AM  Additional Follow-up for Phone Call Additional follow up Details #1::        called and spoke with pt and added pt on the schedule for 7-1 at 2:30pm.  pt is aware of appt made Marijo File CMA  June 27, 2009 4:07 PM

## 2011-02-01 NOTE — Assessment & Plan Note (Signed)
Summary: cpx/ mbw   Chief Complaint:  Yearly CPX....  History of Present Illness: 70 y/o [name] [name] here for a follow up visit and yearly physical exam... his only concern today is the fact that his weight is up and that he is not dieting and not exercising... he os 6'2" tall and weighs 237# for a BMI of 30-31... we have discussed diet & exercise stategies.  Current Problems:  HEALTH MAINTENANCE -  he had TETANUS & PNEUMOVAX 10/00... he is up-to-date on his colonoscopy and prostate checks... HYPERTENSION (ICD-401.9) - controlled on TOPROL & AVAPRO... tolerating well... BP's ave 130's/70's at home and measures 136/76 today... denies HA, fatigue, visual changes, CP, palipit, dizziness, syncope, dyspnea, edema, etc... 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 followed by [name] & last seen 6/08 doing well w/ yearly f/u planned. VENOUS INSUFFICIENCY (ICD-459.81) HYPERCHOLESTEROLEMIA (ICD-272.0) - controlled on LIPITOR 20... last FLP 5/08 showed TChol 125, TG 171, HDL 24, LDL 67... he has tried NIACIN in the past and refuses to take it again due to headaches. DIABETES MELLITUS, BORDERLINE (ICD-790.29) - FBS in the 110-125 range... diet Rx... COLONIC POLYPS (ICD-211.3) - last colonoscopy 10/07 by [name] showed several 5-6 mm polyps... path= tubular adenomas ... f/u planned 3 years. 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 Rx w/ PT. 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 NEURODERMATITIS (ICD-698.3)     Current Allergies (reviewed today): ! 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  The patient denies fever, chills, sweats, anorexia, fatigue, weakness, malaise, weight loss, sleep disorder, blurring, diplopia, eye irritation, eye discharge, vision loss, eye pain, photophobia, earache, ear discharge, tinnitus, decreased hearing, nasal congestion, nosebleeds, sore throat, hoarseness, chest pain, palpitations, syncope, dyspnea on exertion, orthopnea, PND, peripheral edema, cough, dyspnea at rest, excessive sputum, hemoptysis, wheezing, pleurisy, nausea, vomiting, diarrhea, constipation, change in bowel habits, abdominal pain, melena, hematochezia, jaundice, gas/bloating, indigestion/heartburn, dysphagia, odynophagia, dysuria, hematuria, urinary frequency, urinary hesitancy, nocturia, incontinence, back pain, joint pain, joint swelling, muscle cramps, muscle weakness, stiffness, arthritis, sciatica, restless legs, leg pain at night, leg pain with exertion, rash, itching, dryness, suspicious lesions, paralysis, paresthesias, seizures, tremors, vertigo, transient blindness, frequent falls, frequent headaches, difficulty walking, depression, anxiety, memory loss, confusion, cold intolerance, heat intolerance, polydipsia, polyphagia, polyuria, unusual weight change, abnormal bruising, bleeding, enlarged lymph nodes, urticaria, allergic rash, hay fever, and recurrent infections.     Vital Signs:  Patient Profile:   70 Years Old Male Weight:      237 pounds O2 Sat:      96 % Temp:     98.7 degrees F oral Pulse rate:   67 / minute BP sitting:   136 / 76  (left arm)  Vitals Entered By: Marijo File CMA (December 16, 2007 9:16 AM) Oxygen therapy  Room Air                 Physical Exam  WD,  Overweight, 70 y/o [name] [name] in NAD... GENERAL:  Alert & oriented; pleasant & cooperative. HEENT:  [place]/AT, EOM-wnl, PERRLA, Fundi-benign, EACs-clear, TMs-wnl, NOSE-clear, THROAT-clear & wnl. NECK:  Supple w/ full ROM; no JVD; normal carotid impulses w/o bruits; no thyromegaly or nodules palpated; no lymphadenopathy. CHEST:  Clear to P & A; without wheezes/ rales/ or rhonchi. HEART:  Median sternotomy scar w/ keloid, regular rhythm; without murmurs/ rubs/ or gallops. ABDOMEN:  Soft & nontender; normal bowel sounds; no organomegaly or masses detected.  RECTAL:  Neg - prostate 2+ & nontender w/o nodules; stool hematest neg. EXT: without deformities, mild arthritic changes; + venous insuffic changes, no edema. NEURO:  CN's intact; motor testing normal; sensory testing normal; gait normal & balance OK. DERM:  Keloid as noted, no other lesions...      Impression & Recommendations:  Problem # 1:  Preventive Health Care (ICD-V70.0) Up-to-date on needed studies... check PSA, check stool cards.  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 needs better diet and exercise program- discussed. His updated medication list for this problem includes:    Adult Aspirin Low Strength 81 Mg Tbdp (Aspirin) .[name]... Take 1 tablet by mouth once a day    Toprol Xl 50 Mg Tb24 (Metoprolol succinate) .[name]... Take as directed for bp    Avapro 150 Mg Tabs (Irbesartan) .[name]... Take 1 tablet by mouth once a day   Problem # 3:  HYPERTENSION (ICD-401.9) Controlled... continue meds. His updated medication list for this problem includes:    Toprol Xl 50 Mg Tb24 (Metoprolol succinate) .[name]... Take as directed for bp    Avapro 150 Mg Tabs (Irbesartan) .[name]... Take 1 tablet by mouth once a day     Problem # 4:  HYPERCHOLESTEROLEMIA (ICD-272.0) Continue LIPITOR + better diet etc... His updated medication list for this problem includes:    Lipitor 20 Mg Tabs  (Atorvastatin calcium) .[name]... Take 1 tablet by mouth once a day   Complete Medication List: 1)  Adult Aspirin Low Strength 81 Mg Tbdp (Aspirin) .... Take 1 tablet by mouth once a day 2)  Toprol Xl 50 Mg Tb24 (Metoprolol succinate) .... Take as directed for bp 3)  Avapro 150 Mg Tabs (Irbesartan) .... Take 1 tablet by mouth once a day 4)  Lipitor 20 Mg Tabs (Atorvastatin calcium) .... Take 1 tablet by mouth once a day 5)  Viagra 100 Mg Tabs (Sildenafil citrate) .... Use as directed   Patient Instructions: 1)  Please schedule a follow-up appointment in 1 year. 2)  Limit your Sodium (Salt). 3)  It is important that you exercise regularly at least 20 minutes 5 times a week. If you develop chest pain, have severe difficulty breathing, or feel very tired , stop exercising immediately and seek medical attention. 4)  You need to lose weight. Consider a lower calorie diet and regular exercise.  5)  Your meds were sent electronically to [facility] on [address] 6)  Diet and Exercise are the key to keeping your BP under control and improving your lipid panel - esp the low HDL (since you do not tolerate niacin RX)   Prescriptions: VIAGRA 100 MG  TABS (SILDENAFIL CITRATE) use as directed  #10 x prn   Entered and Authorized by:   Michele Mcalpine MD  Signed by:   Michele Mcalpine MD on 12/16/2007   Method used:   Electronically sent to ...       [facility]  [address] [id number]       [address]       [place], [place]  [address]       Ph: [phone] or [phone]       Fax: [phone]   RxID:   [id number] LIPITOR 20 MG TABS (ATORVASTATIN CALCIUM) Take 1 tablet by mouth once a day  #30 x 11   Entered and Authorized by:   Michele Mcalpine MD   Signed by:   Michele Mcalpine MD on 12/16/2007   Method used:   Electronically sent to ...       [facility]  [address] [id number]       [address]       [place], [place]  [address]       Ph: [phone] or  [phone]       Fax: [phone]   RxID:   [id number] AVAPRO 150 MG TABS (IRBESARTAN) Take 1 tablet by mouth once a day  #30 x 11   Entered and Authorized by:   Michele Mcalpine MD   Signed by:   Michele Mcalpine MD on 12/16/2007   Method used:   Electronically sent to ...       [facility]  [address] [id number]       [address]       [place], [place]  [address]       Ph: [phone] or [phone]       Fax: [phone]   RxID:   [id number] TOPROL XL 50 MG TB24 (METOPROLOL SUCCINATE) Take as directed for BP  #30 x 11   Entered and Authorized by:   Michele Mcalpine MD   Signed by:   Michele Mcalpine MD on 12/16/2007   Method used:   Electronically sent to ...       [facility]  [address] [id number]       [address]       [place], [place]  [address]       Ph: [phone] or [phone]       Fax: [phone]   RxID:   [id number]  ]

## 2011-02-01 NOTE — Miscellaneous (Signed)
  Clinical Lists Changes  Observations: Added new observation of EKG INTERP: NSR 69 Q's in 2,3,F ST abnormality , possible dgitalis effect Abnormal ECG (06/01/2009 11:31)      EKG Report  Procedure date:  06/01/2009  Findings:      NSR 69 Q's in 2,3,F ST abnormality , possible dgitalis effect Abnormal ECG

## 2011-02-01 NOTE — Assessment & Plan Note (Signed)
Summary: hosp follow up--pt here at 2:30/lw   CC:  Post hosp ROV....  History of Present Illness: 70 y/o WM here for a follow up visit and post hospital follow up...   ~  followed by [name] for Cardiology- CAD, s/p CABG... he stopped his Atenolol due to side effects and incr the Avapro to 300mg /d... pt feels much better off the BBlocker...  ~  May09:  he had keloid surg by [facility] for the large keloid on the sternal wound... now much improved and he is quite satisfied...   ~  Feb10: doing well overall x difficult time dieting and exercising- as a result weight is the same, & HDL Chol is still low @ 24... feeling better off the BBlocker and BP is good on the incr dose of Avapro...   ~  Jun10:  Hosp w/ TIA w/ left face & arm symptoms that resolved quickly... CT showed small vessel disease;  MRI showed bilat remote lacunar infarcts, no acute infarct, +sm vessel dis;  MRA showed intracranial atherosclerotic changes, & ?focal stenosis (?70%) of left ICA in the neck... 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  he states that he stopped the Plavix after just several days due to swelling in his feet that resolved off the Plavix Rx- we discussed this and I strongly rec that he restart the Plavix due to his recent TIA...    Current Problems:   HEALTH MAINTENANCE -  he had TETANUS & PNEUMOVAX 10/00... had the Seasonal Flu vaccine & H1N1 vaccine in 2009... he is up-to-date on his colonoscopy and prostate checks...  HYPERTENSION (ICD-401.9) - controlled on DIOVAN 160mg /d... BP's ave 130's/70's at home and measures 140/80 today... he takes med regularly & tol well... denies HA, fatigue, visual changes, CP, palipit, dizziness, syncope, dyspnea, edema, etc...  ~  Jun09:  prev on Aten25mg - stopped due to side effects & feels much better off BBlockers.  ~  2/10:  Avapro300 changed to Diovan160 per his insurance  company for $$$ reasons.  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 followed by [name] & last seen 6/10- note reviewed, stable, but adm 2d later w/ TIA as above...  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  HYPERCHOLESTEROLEMIA (ICD-272.0) - controlled on LIPITOR 20- 1/2 tab daily... he has tried NIACIN in the past and refuses to take it again due to headaches.  ~  FLP 5/08 showed TChol 125, TG 171, HDL 24, LDL 67...   ~  FLP 12/08 showed TChol 107, TG 131, HDL 23, LDL 58... rec- same med, better diet & exercise.  ~  FLP 2/10 showed TChol 119, TG 158, HDL 24, LDL 63... rec- ditto  DIABETES MELLITUS, BORDERLINE (ICD-790.29) - FBS in the 110-125 range... diet Rx... he knows that there are diabetic meds in his future if he doesn't get the weight down!!!  OVERWEIGHT (ICD-278.02) - he is 6'2" tall and weighs 240# for a BMI of 31... we have discussed diet & exercise stategies.  COLONIC POLYPS (ICD-211.3) - last colonoscopy 10/07 by [name] showed several 5-6 mm polyps... path= tubular adenomas ... f/u planned 3 years.  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 Rx w/ PT.  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 he had plastic surg by [facility] 5/09 w/ improved scar.  NEURODERMATITIS (ICD-698.3)   Allergies: 1)  ! Niacin (Niacin)  Comments:  Nurse/Medical Assistant: The patient's medications and allergies were reviewed with the patient and were updated in the Medication and Allergy Lists.  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 Father died of lung cancer at age 53. Mother is alive at age 32, MI at 79, has two brothers, one is a diabetic, one sister.  Social History: Reviewed history from 06/01/2009 and no changes required. The patient is married. He is retired,  has one child,  never smoked---dipped while playing ball years ago  does not drink.  Review of Systems      See HPI  The patient denies anorexia, fever, weight loss, weight gain, vision loss, decreased hearing, hoarseness, chest pain, syncope, dyspnea on exertion, peripheral edema, prolonged cough, headaches, hemoptysis, abdominal pain, melena, hematochezia, severe indigestion/heartburn, hematuria, incontinence, muscle weakness, suspicious skin lesions, transient blindness, difficulty walking, depression, unusual weight change, abnormal bleeding, enlarged lymph nodes, and angioedema.    Vital Signs:  Patient profile:   70 year old male Height:      74 inches Weight:      239.25 pounds BMI:     30.83 O2 Sat:      98 % on Room air Temp:     98.5 degrees F oral Pulse rate:   75 / minute BP sitting:   140 / 80  (right arm) Cuff size:   regular  Vitals Entered By: Marijo File CMA (June 30, 2009 2:20 PM)  O2 Sat at Rest %:  98 O2 Flow:  Room air CC: Post hosp ROV... Is Patient Diabetic? No Pain Assessment Patient in pain? no      Comments no changes in meds ---reviewed with pt   Physical  Exam  Additional Exam:  WD, Overweight, 70 y/o WM in NAD... GENERAL:  Alert & oriented; pleasant & cooperative. HEENT:  [place]/AT, EOM-wnl, PERRLA, Fundi-benign, EACs-clear, TMs-wnl, NOSE-clear, THROAT-clear & wnl. NECK:  Supple w/ fairROM; no JVD; normal carotid impulses w/o bruits; no thyromegaly or nodules palpated; no lymphadenopathy. CHEST:  Clear to P & A; without wheezes/ rales/ or rhonchi. HEART:  Median sternotomy scar w/ improved keloid, regular rhythm; without murmurs/ rubs/ or gallops. ABDOMEN:  Soft & nontender; normal bowel sounds; no organomegaly or masses detected. EXT: without deformities, mild arthritic changes; + venous insuffic changes, no edema. NEURO:  CN's intact; motor testing normal; sensory testing normal; gait normal & balance OK. DERM:  Keloid as noted, no other lesions...    MISC. Report  Procedure date:  06/30/2009  Findings:      Reviewed during this visit:  OV DrNishan 06/01/09... Hospitalization 6/4-5/10... XRays, Labs, etc... [name]'s OV 06/08/09 and Carotid Duplex...  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 he rec continuing BOTH ASA & PLAVIX and pt is so instructed!!!  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 he is told to maintain the ASA/ Plavix to minimize his risk of further TIA's or strokes... His updated medication list for this problem includes:    Adult Aspirin Low Strength 81 Mg Tbdp (Aspirin) .[name]... Take 1 tablet by mouth once a day    Plavix 75 Mg Tabs (Clopidogrel bisulfate) .[name]... Take 1 tab by mouth once daily...  Problem # 3:  HYPERTENSION (ICD-401.9) Controlled-  same meds... no salt... get wt down... His updated medication list for this problem includes:    Diovan 160 Mg Tabs  (Valsartan) .[name]... 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 His updated medication list for this problem includes:    Adult Aspirin Low Strength 81 Mg Tbdp (Aspirin) .[name]... Take 1 tablet by mouth once a day    Plavix 75 Mg Tabs (Clopidogrel bisulfate) .[name]... Take 1 tab by mouth once daily...    Diovan 160 Mg Tabs (Valsartan) .[name]... Take one tablet once daily  Problem # 5:  HYPERCHOLESTEROLEMIA (ICD-272.0) Stable-  continue the Lipitor... His updated medication list for this problem includes:    Lipitor 20 Mg Tabs (Atorvastatin calcium) .[name]... Take as directed...  Problem # 6:  DIABETES MELLITUS, BORDERLINE (ICD-790.29) Diet + exercise are the key...  Problem # 7:  OTHER MEDICAL PROBLEMS AS LISTED--- continue present meds...  Complete Medication List: 1)  Adult Aspirin Low Strength 81 Mg Tbdp (Aspirin) .... Take 1 tablet by mouth once a day 2)  Plavix 75 Mg Tabs (Clopidogrel bisulfate) .... Take 1 tab by mouth once daily.Marland KitchenMarland Kitchen 3)  Diovan 160 Mg Tabs (Valsartan) .... Take one tablet once daily 4)  Lipitor 20 Mg Tabs (Atorvastatin calcium) .... Take as directed... 5)  Viagra 100 Mg Tabs (Sildenafil citrate) .... Use as directed  Patient Instructions: 1)  Today we updated your med list- see below.... 2)  I rec that you take the Plavix daily.Marland KitchenMarland Kitchen 3)  If you develope any swelling on this medication- call me and we will start a mild diuretic... in the meanwhile- eliminate the salt from your diet.Marland KitchenMarland Kitchen 4)  Let's get on track w/ our diet & exercise program... 5)  Call for any questions.[name] 6)  Please schedule a follow-up appointment in 4 months.

## 2011-02-01 NOTE — Assessment & Plan Note (Signed)
Summary: f1y/dm      Allergies Added:   History of Present Illness: [name] [name] seen today in followup.  His known coronary disease with previous coronary bypass surgery in 2005.  he has normal LV function.  His risk factors will modify.  Not having significant chest pain PND orthopnea is really active.  He has 3 young grandchildren and is quite active with him.   She is Dr. Kriste Basque for his primary care needs. He had a TIA last year with mild facial droop for about 3 minutes.  W/U negative.  On Plavix.  F/U duplex with Dr. Arbie Cookey soon.  HDL only 27 with very low TC.  Suggest he cut Lipitor to 5mg  and possibly start niaspan.    Current Problems (verified): 1)  Need Prophylactic Vaccination&inoculation Flu  (ICD-V04.81) 2)  Hypertension  (ICD-401.9) 3)  Coronary Artery Disease  (ICD-414.00) 4)  Cerebrovascular Disease  (ICD-437.9) 5)  Venous Insufficiency  (ICD-459.81) 6)  Hypercholesterolemia  (ICD-272.0) 7)  Diabetes Mellitus, Borderline  (ICD-790.29) 8)  Overweight  (ICD-278.02) 9)  Colonic Polyps  (ICD-211.3) 10)  Shoulder Pain  (ICD-719.41) 11)  Vitamin D Deficiency  (ICD-268.9) 12)  Gynecomastia, Unilateral  (ICD-611.1) 13)  Transient Ischemic Attack  (ICD-435.9) 14)  Keloid  (ICD-701.4) 15)  Neurodermatitis  (ICD-698.3)  Current Medications (verified): 1)  Adult Aspirin Low Strength 81 Mg  Tbdp (Aspirin) .... Take 1 Tablet By Mouth Once A Day 2)  Plavix 75 Mg Tabs (Clopidogrel Bisulfate) .... Take 1 Tab By Mouth Once Daily.Marland KitchenMarland Kitchen 3)  Diovan 160 Mg Tabs (Valsartan) .... Take One Tablet Once Daily 4)  Lipitor 20 Mg Tabs (Atorvastatin Calcium) .... Take As Directed... 5)  Viagra 100 Mg  Tabs (Sildenafil Citrate) .... Use As Directed 6)  Vitamin D3 2000 Unit Caps (Cholecalciferol) .... Take 1 Cap By Mouth Once Daily...  Allergies (verified): 1)  ! 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 Mother is alive at age 47, MI at 49, has two brothers, one is a diabetic, one sister.  Social History: Last updated: 07-07-2009 The patient is married. He is retired,  has one child,  never smoked---dipped while playing ball years ago  does not drink.  Review of Systems       Denies fever, malais, weight loss, blurry vision, decreased visual acuity, cough, sputum, SOB, hemoptysis, pleuritic pain, palpitaitons, heartburn, abdominal pain, melena, lower extremity edema, claudication, or rash.   Vital Signs:  Patient profile:   70 year old male Height:      74 inches Weight:      223 pounds BMI:     28.73 Pulse rate:   75 / minute Resp:     12 per minute BP sitting:   150 / 80  (left arm)  Vitals Entered By: Kem Parkinson (June 02, 2010 9:27 AM)  Physical Exam  General:  Affect appropriate Healthy:  appears stated age HEENT: normal Neck supple with no adenopathy JVP normal no bruits no thyromegaly Lungs clear  with no wheezing and good diaphragmatic motion Heart:  S1/S2 no murmur,rub, gallop or click PMI normal Abdomen: benighn, BS positve, no tenderness, no AAA no bruit.  No HSM or HJR Distal pulses intact with no bruits No edema Neuro non-focal Skin warm and dry    Impression & Recommendations:  Problem # 1:   HYPERTENSION (ICD-401.9) Borderline control.  Continue ACE.  Monitor at home Low sodium diet His updated medication list for this problem includes:    Adult Aspirin Low Strength 81 Mg Tbdp (Aspirin) .[name]... Take 1 tablet by mouth once a day    Diovan 160 Mg Tabs (Valsartan) .[name]... 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  Did not have classic angina prior to CABG His updated medication list for this problem includes:    Adult Aspirin Low Strength 81 Mg Tbdp (Aspirin) .[name]... Take 1 tablet by mouth once a day    Plavix 75 Mg Tabs (Clopidogrel bisulfate) .[name]... Take 1 tab by mouth once daily...  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  F/U primary and Dr Arbie Cookey  Problem # 4:  HYPERCHOLESTEROLEMIA (ICD-272.0) Only on 10mg  of Lipitor.  HDL very low.  Decrease lipitor to 5 mg and F/ Ulabs 6 months.  Consdier adding niacin.  Increase activity level His updated medication list for this problem includes:    Lipitor 20 Mg Tabs (Atorvastatin calcium) .[name]... Take as directed...  Patient Instructions: 1)  Your physician recommends that you schedule a follow-up appointment in:ONE YEAR   EKG Report  Procedure date:  06/02/2010  Findings:      NSR 75 Normal ECG

## 2011-02-01 NOTE — Progress Notes (Signed)
Summary: labs  Medications Added AVAPRO 150 MG TABS (IRBESARTAN)  LIPITOR 20 MG TABS (ATORVASTATIN CALCIUM)  TOPROL XL 50 MG TB24 (METOPROLOL SUCCINATE) Take 1 tablet by mouth once a day       Phone Note Call from Patient Call back at Home Phone [phone]   Caller: Patient Reason for Call: Talk to Nurse Summary of Call: cpx on 12/16 - would like labs to be put in so he can come early to have them done, call pt to let him know if ok chart ordered/reading [place] area Initial call taken by: Eugene Gavia,  December 01, 2007 8:35 AM  Follow-up for Phone Call        PT AWARE LABS ARE IN COMPUTER Follow-up by: Philipp Deputy CMA,  December 02, 2007 12:27 PM    New/Updated Medications: AVAPRO 150 MG TABS (IRBESARTAN)  LIPITOR 20 MG TABS (ATORVASTATIN CALCIUM)  TOPROL XL 50 MG TB24 (METOPROLOL SUCCINATE) Take 1 tablet by mouth once a day

## 2011-02-01 NOTE — Assessment & Plan Note (Signed)
Summary: cpx/apc   Chief Complaint:  Yearly ROV & review of mult med issues....  History of Present Illness: 70 y/o WM here for a follow up visit and yearly physical exam...    ~  followed by [name] for Cardiology- CAD, s/p CABG... he stopped his Atenolol due to side effects and incr the Avapro to 300mg /d... pt feels much better off the BBlocker... Myoview due later this yr.  ~  May09:  he had keloid surg by [facility] for the large keloid on the sternal wound... now much improved and he is quite satisfied...   ~  February 04, 2009:  doing well overall x difficult time dieting and exercising- as a result weight is the same, & HDL Chol is still low @ 24... feeling better off the BBlocker and BP is good on the incr dose of Avapro... due for H1N1 vaccination today and refill meds.    Current Problems:   HEALTH MAINTENANCE -  he had TETANUS & PNEUMOVAX 10/00... had the Seasonal Flu vaccine last fall, and due for the H1N1 vaccination today... he is up-to-date on his colonoscopy and prostate checks...  HYPERTENSION (ICD-401.9) - controlled on AVAPRO 300mg /d... BP's ave 130's/70's at home and measures 128/80 today... he takes med regularly & tol well... denies HA, fatigue, visual changes, CP, palipit, dizziness, syncope, dyspnea, edema, etc...  ~  Jun09:  prev on Aten25mg - stopped due to side effects & feels much better off BBlockers.  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 followed by [name] & last seen 6/09- Atenolol stopped & Avapro incr... due for f/u Myoview this yr.  VENOUS INSUFFICIENCY (ICD-459.81) - he is aware of need to elim salt, elevate legs, wear support hose.  HYPERCHOLESTEROLEMIA (ICD-272.0) - controlled on LIPITOR 20- 1/2 tab daily... he has tried NIACIN in the past and refuses to take it again due to headaches.  ~  FLP 5/08 showed TChol 125, TG 171, HDL 24, LDL 67...   ~  FLP 12/08 showed TChol 107, TG 131, HDL 23, LDL 58... rec-  same med, better diet & exercise.  ~  FLP 2/10 showed TChol 119, TG 158, HDL 24, LDL 63... rec- ditto  DIABETES MELLITUS, BORDERLINE (ICD-790.29) - FBS in the 110-125 range... diet Rx... he knows that there are diabetic meds in his future if he doesn't get the weight down!!!  OVERWEIGHT (ICD-278.02) - he is 6'2" tall and weighs 235# for a BMI of 30-31... we have discussed diet & exercise stategies.  COLONIC POLYPS (ICD-211.3) - last colonoscopy 10/07 by [name] showed several 5-6 mm polyps... path= tubular adenomas ... f/u planned 3 years.  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 Rx w/ PT.  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  NEURODERMATITIS (ICD-698.3)       Current Allergies (reviewed today): ! 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  The patient denies fever, chills, sweats, anorexia, fatigue, weakness, malaise, weight loss, sleep disorder, blurring, diplopia, eye irritation, eye discharge, vision loss, eye pain, photophobia, earache, ear discharge, tinnitus,  decreased hearing, nasal congestion, nosebleeds, sore throat, hoarseness, chest pain, palpitations, syncope, orthopnea, PND, peripheral edema, cough, dyspnea at rest, excessive sputum, hemoptysis, wheezing, pleurisy, nausea, vomiting, diarrhea, constipation, change in bowel habits, abdominal pain, melena, hematochezia, jaundice, gas/bloating, indigestion/heartburn, dysphagia, odynophagia, dysuria, hematuria, urinary frequency, urinary hesitancy, nocturia, incontinence, back pain, joint pain, joint swelling, muscle cramps, muscle weakness, stiffness, arthritis, sciatica, restless legs, leg pain at night, leg pain with exertion, rash, itching, dryness, suspicious lesions, paralysis, paresthesias, seizures, tremors, vertigo, transient blindness, frequent falls, frequent headaches, difficulty walking, depression, anxiety, memory loss, confusion, cold intolerance, heat intolerance, polydipsia, polyphagia, polyuria, unusual weight change, abnormal bruising, bleeding, enlarged lymph nodes, urticaria, allergic rash, hay fever, and recurrent infections.    Vital Signs:  Patient Profile:   70 Years Old Male Weight:      235.25 pounds O2 Sat:      96 % O2 treatment:    Room Air Temp:     97.4 degrees F oral Pulse rate:   67 / minute BP sitting:   128 / 80  (right arm) Cuff size:   regular  Vitals Entered By: Marijo File CMA (February 03, 2009 11:11 AM)             Comments PT KNOWS HIS MEDS    Physical Exam  WD, Overweight, 70 y/o WM in NAD... GENERAL:  Alert & oriented; pleasant & cooperative. HEENT:  [place]/AT, EOM-wnl, PERRLA, Fundi-benign, EACs-clear, TMs-wnl, NOSE-clear, THROAT-clear & wnl. NECK:  Supple w/ fairROM; no JVD; normal carotid impulses w/o bruits; no thyromegaly or nodules palpated; no lymphadenopathy. CHEST:  Clear to P & A; without wheezes/ rales/ or rhonchi. HEART:  Median sternotomy scar w/ improved keloid, regular rhythm; without murmurs/ rubs/ or gallops. ABDOMEN:  Soft &  nontender; normal bowel sounds; no organomegaly or masses detected. RECTAL:  Neg - prostate 2+ & nontender w/o nodules; stool hematest neg. EXT: without deformities, mild arthritic changes; + venous insuffic changes, no edema. NEURO:  CN's intact; motor testing normal; sensory testing normal; gait normal & balance OK. DERM:  Keloid as noted, no other lesions...    Impression & Recommendations:  Problem # 1:  HYPERTENSION (ICD-401.9) Controlled-  continue the Avapro and get the weight down... The following medications were removed from the medication list:    Atenolol 50 Mg Tabs (Atenolol) .[name]... Take as directed His updated medication list for this problem includes:    Avapro 300 Mg Tabs (Irbesartan) .[name]... 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 needs better diet + exercise program- discussed w/ pt. The following medications were removed from the medication list:    Atenolol 50 Mg Tabs (Atenolol) .[name]... Take as directed His updated medication list for this problem includes:    Adult Aspirin Low Strength 81 Mg Tbdp (Aspirin) .[name]... Take 1 tablet by mouth once  a day    Avapro 300 Mg Tabs (Irbesartan) .[name]... Take 1 tablet by mouth once a day   Problem # 3:  HYPERCHOLESTEROLEMIA (ICD-272.0) TChol & LDL are OK on the 10mg  of Lipitor... he needs to get the TG down and HDL up.... diet + exercise is how he wants to go w/ this, but we discussed the poss of adding a Fibrate... he is intol to Niacin... His updated medication list for this problem includes:    Lipitor 20 Mg Tabs (Atorvastatin calcium) .[name]... Take as directed...   Problem # 4:  DIABETES MELLITUS, BORDERLINE (ICD-790.29) On diet alone- must get weight down... we discussed Metformin but he prefers to wait.  Problem # 5:  COLONIC POLYPS (ICD-211.3) Conlonoscopy due 10/10...  Complete Medication List: 1)  Adult Aspirin Low Strength 81 Mg Tbdp (Aspirin) ....  Take 1 tablet by mouth once a day 2)  Avapro 300 Mg Tabs (Irbesartan) .... Take 1 tablet by mouth once a day 3)  Lipitor 20 Mg Tabs (Atorvastatin calcium) .... Take as directed... 4)  Viagra 100 Mg Tabs (Sildenafil citrate) .... Use as directed  Other Orders: H1N1 vaccine G code (Z6109) H1N1 Admin Fee (Medicaid IM) & (ALL Flu Mist Adm) (60454) Influenza A (H1N1) adm  fee Medicare/Non Medicare [phone])  Patient Instructions: 1)  Today we updated your med list- see below.... 2)  We refilled your perscriptions for 2010... 3)  We gave you the H1N1 Flu vaccine today... 4)  [name], you need to get the weight down--- diet + exercise!!! 5)  Call for any problems.Marland KitchenMarland Kitchen 6)  You will be due for a follow up w/ DrNishan and a Myoview scan in June... 7)  You will be due for a colonoscopy w/ [name] by October... Prescriptions: VIAGRA 100 MG  TABS (SILDENAFIL CITRATE) use as directed  #10 x prn   Entered and Authorized by:   Michele Mcalpine MD   Signed by:   Michele Mcalpine MD on 02/03/2009   Method used:   Print then Give to Patient   RxID:   [id number] LIPITOR 20 MG TABS (ATORVASTATIN CALCIUM) take as directed...  #30 x prn   Entered and Authorized by:   Michele Mcalpine MD   Signed by:   Michele Mcalpine MD on 02/03/2009   Method used:   Print then Give to Patient   RxID:   [id number] AVAPRO 300 MG TABS (IRBESARTAN) Take 1 tablet by mouth once a day  #30 x prn   Entered and Authorized by:   Michele Mcalpine MD   Signed by:   Michele Mcalpine MD on 02/03/2009   Method used:   Print then Give to Patient   RxID:   [id number]    Flu Vaccine Consent Questions    Do you have a history of severe allergic reactions to this vaccine? no    Any prior history of allergic reactions to egg and/or gelatin? no    Do you have a sensitivity to the preservative Thimersol? no    Do you have a past history of Guillan-Barre Syndrome? no    Do you currently have an acute febrile illness? no    Have you ever  had a severe reaction to latex? no    Vaccine information given and explained to patient? yes  H1N1 # 1    Vaccine Type: H1N1 vaccine G code    Site: left deltoid    Mfr: novartis    Dose: 0.5 ml  Route: IM    Given by: Marijo File CMA    Exp. Date: 03/30/2009    Lot #: [id number]    VIS given: 09/30/2009 given February 03, 2009.

## 2011-02-01 NOTE — Progress Notes (Signed)
Summary: order  Phone Note Call from Patient   Caller: Patient Call For: Kumiko Fishman Summary of Call: need order put in for labs for  cpx next week Initial call taken by: Rickard Patience,  May 08, 2010 9:01 AM  Follow-up for Phone Call        please advise on what labs to order. Thanks. Carron Curie CMA  May 08, 2010 9:05 AM   per SN---lip-272.0/bmp-401.9/hepat-790.5/cbcd-285.9/tsh-244.9 and psa-v76.44/leave/  thanks Randell Loop CMA  May 08, 2010 12:10 PM   Additional Follow-up for Phone Call Additional follow up Details #1::        ORDER PLACED. PT AWARE. Carron Curie CMA  May 08, 2010 12:14 PM

## 2011-02-01 NOTE — Assessment & Plan Note (Signed)
Summary: f/u 6 months///kp   CC:  6 month follow up.  History of Present Illness: 70 y/o [name] [name] here for a follow up visit... he has multiple medical problems as noted below...     ~  followed by [name] [name] [name] for Cardiology- CAD, s/p CABG... he stopped his Atenolol due to side effects and incr the Avapro to 300mg /d... pt feels much better off the BBlocker...  ~  May09:  he had keloid surg by [name] [name] [name] ([facility]) for the large keloid on the sternal wound... now much improved and he is quite satisfied...   ~  Feb10: doing well overall x difficult time dieting and exercising- as a result weight is the same, & HDL Chol is still low @ 24... feeling better off the BBlocker and BP is good on the incr dose of Avapro...   ~  Jun10:  Hosp w/ TIA w/ left face & arm symptoms that resolved quickly... CT showed small vessel disease;  MRI showed bilat remote lacunar infarcts, no acute infarct, +sm vessel dis;  MRA showed intracranial atherosclerotic changes, & ?focal stenosis (?70%) of left ICA in the neck... 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  he states that he stopped the Plavix after just several days due to swelling in his feet that resolved off the Plavix Rx- we discussed this and I strongly rec that he restart the Plavix due to his recent TIA...   ~  November 17, 2009:  he is taking his ASA + PLAX and stable... he notes BP well controlled on Rx w/ home B monitoring & no CP, palpit, SOB, etc... he's already had the 2010 flu vaccine & due for f/u labs today.   Current Problems:   [name] MAINTENANCE -  he had TETANUS & PNEUMOVAX 10/00... we will go ahead w/ his f/u PNEUAX at age 63 now... had the 2010 flu vaccine 10/10... he is up-to-date on his colonoscopy and prostate checks...  HYPERTENSION (ICD-401.9) - controlled on DIOVAN 160mg /d... BP's ave 130's/70's at home and measures 150/80 here today... he takes med regularly & tol  well... denies HA, fatigue, visual changes, CP, palipit, dizziness, syncope, dyspnea, edema, etc...  ~  Jun09:  prev on Aten25mg - stopped due to side effects & feels much better off BBlockers.  ~  2/10:  Avapro300 changed to Diovan160 per his insurance company for $$$ reasons.  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 followed by [name] [name] [name] & last seen 6/10- note reviewed, stable, but adm 2d later w/ TIA as above... currently denies angina, palpit, SOB, edema, etc...  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 followed by [name] [name].  VENOUS INSUFFICIENCY (ICD-459.81) - he is aware of need to elim salt, elevate legs, wear support hose.  HYPERCHOLESTEROLEMIA (ICD-272.0) - controlled on LIPITOR 20- 1/2 tab daily... he has tried NIACIN in the past and refuses to take it again due to headaches.  ~  FLP 5/08 showed TChol 125, TG 171, HDL 24, LDL 67...   ~  FLP 12/08 showed TChol 107, TG 131, HDL 23, LDL 58... rec- same med, better diet & exercise.  ~  FLP 2/10 on Lip10 (wt=235#) showed TChol 119, TG 158, HDL 24, LDL 63... rec- ditto  ~  FLP 11/10 on Lip10 (wt=236#) showed TChol 115, TG 128, HDL 28, LDL 62  DIABETES MELLITUS, BORDERLINE (ICD-790.29) - FBS in the 110-125  range... diet Rx... he knows that there are diabetic meds in his future if he doesn't get the weight down!!!  ~  labs in hosp 6/10 showed BS= 109-113  ~  labs 11/10 showed BS= 109, A1c= 5.9  OVERWEIGHT (ICD-278.02) - he is 6'2" tall and weighs  ~240# for a BMI of 31... we have discussed diet & exercise stategies.  COLONIC POLYPS (ICD-211.3) - last colonoscopy 10/07 by [name] [name] [name] showed several 5-6 mm polyps... path= tubular adenomas ... f/u planned 3 years.  SHOULDER PAIN (ICD-719.41) - eval 6/08 by [name] [name] [name] w/ adhesive capsulitis... Rx w/ PT.  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  ~  labs 2/10 showed Vit D level = 14... rec to  start 2000 u daily.  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 he had plastic surg by [facility] 5/09 w/ improved scar.  NEURODERMATITIS (ICD-698.3)   Allergies: 1)  ! 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 [name] [name] died of lung cancer at age 60. [name] [name] is alive at age 67, MI at 62, has two brothers, one is a diabetic, one sister.  Social History: Reviewed history from 06/30/2009 and no changes required. The patient is married. He is retired,  has one child,  never smoked---dipped while playing ball years ago  does not drink.  Review of Systems  The patient denies anorexia, fever, weight loss, weight gain, vision loss, decreased hearing, hoarseness, chest pain, syncope, dyspnea on exertion, peripheral edema, prolonged cough, headaches, hemoptysis, abdominal pain, melena, hematochezia, severe indigestion/heartburn, hematuria, incontinence, muscle weakness, suspicious skin lesions, transient blindness, difficulty walking, depression, unusual weight change, abnormal bleeding, enlarged lymph nodes, and angioedema.    Vital  Signs:  Patient profile:   70 year old male Height:      74 inches Weight:      236.0 pounds BMI:     30.41 O2 Sat:      95 % on Room air Temp:     98.1 degrees F oral Pulse rate:   67 / minute BP sitting:   150 / 80  (left arm) Cuff size:   regular  Vitals Entered By: Marijo File CMA (November 17, 2009 9:04 AM)  O2 Sat at Rest %:  98.1 O2 Flow:  Room air CC: 6 month follow up Comments no changes in meds   Physical Exam  Additional Exam:  WD, Overweight, 70 y/o [name] [name] in NAD... GENERAL:  Alert & oriented; pleasant & cooperative. HEENT:  [place]/AT, EOM-wnl, PERRLA, Fundi-benign, EACs-clear, TMs-wnl, NOSE-clear, THROAT-clear & wnl. NECK:  Supple w/ fairROM; no JVD; normal carotid impulses w/o bruits; no thyromegaly or nodules palpated; no lymphadenopathy. CHEST:  Clear to P & A; without wheezes/ rales/ or rhonchi. HEART:  Median sternotomy scar w/ improved keloid, regular rhythm; without murmurs/ rubs/ or gallops. ABDOMEN:  Soft & nontender; normal bowel sounds; no organomegaly or masses detected. 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 EXT: without deformities, mild arthritic changes; + venous insuffic changes, no edema. NEURO:  CN's intact; motor testing normal; sensory testing normal; gait normal & balance OK. DERM:  Keloid as noted, no other lesions...    MISC. Report  Procedure date:  11/17/2009  Findings:      Tests: (1) Lipid Panel (LIPID)   Cholesterol               115 mg/dL                   9-323   Triglycerides             128.0 mg/dL                 5.5-732.2   HDL                  [L]  02.54 mg/dL                 >27.06   VLDL Cholesterol          25.6 mg/dL                  2.3-76.2   LDL Cholesterol           62 mg/dL                    8-31  Tests: (2) BMP (METABOL)   Sodium                    142 mEq/L                   135-145   Potassium                 4.4 mEq/L                   3.5-5.1   Chloride                  105 mEq/L                    96-112   Carbon Dioxide            30 mEq/L                    19-32   Glucose              [H]  109 mg/dL                   51-76   BUN                       13 mg/dL                    1-60   Creatinine                0.9 mg/dL                   7.3-7.1   Calcium                   9.3 mg/dL                   0.6-26.9   GFR  89.73 mL/min                >60  Tests: (3) Hemoglobin A1C (A1C)   Hemoglobin A1C            5.9 %                       4.6-6.5   Impression & Recommendations:  Problem # 1:  HYPERTENSION (ICD-401.9) BP sl elevated here but he confirms all norm at home checks... continue Rx. His updated medication list for this problem includes:    Diovan 160 Mg Tabs (Valsartan) .[name]... 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 needs better diet + exercise. His updated medication list for this problem includes:    Adult Aspirin Low Strength 81 Mg Tbdp (Aspirin) .[name]... Take 1 tablet by mouth once a day    Plavix 75 Mg Tabs (Clopidogrel bisulfate) .[name]... Take 1 tab by mouth once daily...    Diovan 160 Mg Tabs (Valsartan) .[name]... 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   Problem # 4:  HYPERCHOLESTEROLEMIA (ICD-272.0) HDL remains low... he can't tol Niacin & refuses Crestor ($$$), doesn't want additional med like fenofib...  Needs better diet + exercise program & we discussed this (again)... His updated medication list for this problem includes:    Lipitor 20 Mg Tabs (Atorvastatin calcium) .[name]... Take as directed...  Problem # 5:  DIABETES MELLITUS, BORDERLINE (ICD-790.29) Remains well controlled on diet alone but needs to lose weight!  Problem # 6:  VITAMIN D DEFICIENCY  (ICD-268.9) Reminded to take the Vit D 2000 u daily...  Problem # 7:  OTHER MEDICAL PROBLEMS AS NOTED>>> OK Pneumovax today... holding off on Tetanus until Tdap approved tor >65...  Complete Medication List: 1)  Adult Aspirin Low Strength 81 Mg Tbdp (Aspirin) .... Take 1 tablet by mouth once a day 2)  Plavix 75 Mg Tabs (Clopidogrel bisulfate) .... Take 1 tab by mouth once daily.Marland KitchenMarland Kitchen 3)  Diovan 160 Mg Tabs (Valsartan) .... Take one tablet once daily 4)  Lipitor 20 Mg Tabs (Atorvastatin calcium) .... Take as directed... 5)  Viagra 100 Mg Tabs (Sildenafil citrate) .... Use as directed  Other Orders: Pneumococcal Vaccine (60454) Admin 1st Vaccine (09811)  Patient Instructions: 1)  Today we updated your med list- see below.... 2)  Continue your current medications the same... 3)  Today we did your f/u fasting blood work... please call the "phone tree" in a few days for your lab results.Marland KitchenMarland Kitchen 4)  We also gave your the f/u Pneumonia vaccine today- this is the last one you will need based on the current recommendations.Marland KitchenMarland Kitchen 5)  Call for any problems.Marland KitchenMarland Kitchen 6)  Please schedule a follow-up appointment in 6 months.   Immunizations Administered:  Pneumonia Vaccine:    Vaccine Type: Pneumovax (Medicare)    Site: left deltoid    Mfr: Merck    Dose: 0.5 ml    Route: IM    Given by: Marijo File CMA    Exp. Date: 12/16/2010    Lot #: [id number]    VIS given: 07/28/96 version given November 17, 2009.

## 2011-02-01 NOTE — Assessment & Plan Note (Signed)
Summary: 6 months/apc   CC:  6 month ROV & review of mult medical problems....  History of Present Illness: 70 y/o WM here for a follow up visit... he has multiple medical problems as noted below...     ~  Jun10:  Hosp w/ TIA w/ left face & arm symptoms that resolved quickly... CT showed small vessel disease;  MRI showed bilat remote lacunar infarcts, no acute infarct, +sm vessel dis;  MRA showed intracranial atherosclerotic changes, & ?focal stenosis (?70%) of left ICA in the neck... 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  he states that he stopped the Plavix after just several days due to swelling in his feet that resolved off the Plavix Rx- we discussed this and I strongly rec that he restart the Plavix due to his recent TIA...   ~  May 16, 2010:  still not dieting or exercising & wt stable  ~233#> we discussed needed diet + exercise program for weight reduction... BP controlled on the Diovan;  denies angina etc & due for f/u DrNishan soon;  he continues on the ASA/ Plavix w/o TIAs etc;  FLP looks good on Lip10 x low HDL & rec for incr exercise...    ~  November 14, 2010:  he's had a good 74mo- no new complaints or concerns... he saw [name] 6/11 for yearly f/u doing satis & no changes made... he mentioned that [place] wanted to decr Lipitor10 to 5mg /d since his LDL was in the 60's & add Niaspan to help raise his HDL but he refuses Niacin preps due to HA & flushing, and he prefers to continue Lip10 daily... BP sl elev today but he states all 130s/ 70s at home;  weight still in the 235# range & we reviewed diet + exercise perscription for him;  there have been no interval cerebral ischemic events on ASA & Plavix...     Current Problems:   HYPERTENSION (ICD-401.9) - controlled on DIOVAN 160mg /d... BP's ave 130's/70's at home and measures 150/80 here today... he takes med regularly & tol well... denies HA,  fatigue, visual changes, CP, palipit, dizziness, syncope, dyspnea, edema, etc...  ~  Jun09:  prev on Atenolol25mg - stopped due to side effects & feels much better off BBlockers.  ~  2/10:  Avapro300 changed to Diovan160 per his insurance company for $$$ reasons.  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 on ASA/ PLAVIX & followed by [name] & his notes reviewed> stable & currently denies angina, palpit, SOB, edema, etc... he is intol to [facility] therapy as noted.  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 followed by Lyla Son for VVS.  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  VENOUS INSUFFICIENCY (ICD-459.81) - he is aware of need to elim salt, elevate legs, wear support hose.  HYPERCHOLESTEROLEMIA (ICD-272.0) - controlled on LIPITOR 20- 1/2 tab daily... he has tried NIACIN in the past and refuses to take it again due to headaches.  ~  FLP 5/08 showed TChol 125, TG 171, HDL 24, LDL 67...   ~  FLP 12/08 showed TChol 107, TG 131, HDL 23, LDL 58... rec- same med, better diet & exercise.  ~  FLP 2/10 on Lip10 (wt=235#) showed TChol 119, TG 158, HDL 24, LDL 63... rec-  ditto  ~  FLP 11/10 on Lip10 (wt=236#) showed TChol 115, TG 128, HDL 28, LDL 62  ~  FLP 5/11 on Lip10 (wt=233#) showed TChol 119, TG 144, HDL 27, LDL 63  ~  DrNishan suggested that he cut the Lipitor to 5mg /d & add Niaspan, but he refuses the Niacin preps due to HAs & wants to contin Lip10.  ~  FLP 11/11 on Lip10 (wt=235#) showed TChol 119, TG 113, HDL 31, LDL 66  DIABETES MELLITUS, BORDERLINE (ICD-790.29) - FBS in the 110-125 range... diet Rx... he knows that there are diabetic meds in his future if he doesn't get the weight down!!!  ~  labs in hosp 6/10 showed BS= 109-113  ~  labs 11/10 showed BS= 109, A1c= 5.9  ~  labs 5/11 showed BS= 107  ~  labs 11/11 showed BS=  108  OVERWEIGHT (ICD-278.02) - he is 6'2" tall and weighs  ~240# for a BMI of 31... we have discussed diet & exercise stategies.  COLONIC POLYPS (ICD-211.3) - last colonoscopy 10/07 by [name] showed several 5-6 mm polyps... path= tubular adenomas ... f/u planned 5 years.  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 Rx w/ PT.  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  ~  labs 2/10 showed Vit D level = 14... rec to start 2000 u daily.  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 he had plastic surg by [facility] 5/09 w/ improved scar.  NEURODERMATITIS (ICD-698.3) HEALTH MAINTENANCE:  ~  GI:  followed by [name] & last colonoscopy was 10/07 w/ f/u planned 8yrs.  ~  GU:  he is up to date on DRE & PSA checks here...  ~  Immunizations:  he had PNEUMOVAX & TETANUS in 2000 w/ repeat PNEUMOVAX 11/10 & TDAP 5/11... he gets the yearly Seasonal Flu vaccine each fall...   Preventive Screening-Counseling & Management  Alcohol-Tobacco     Smoking Status: never  Allergies: 1)  ! Niacin (Niacin)  Comments:  Nurse/Medical Assistant: The patient's medications and allergies were reviewed with the patient and were updated in the Medication and Allergy Lists.  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 Father died of lung cancer at age 36. Mother is alive at age 60, MI at 40, has two brothers, one is a diabetic, one sister.  Social History: Reviewed history from 06/30/2009 and no changes required. The patient is married. He is retired,  has one child,  never smoked---dipped while playing ball years ago  does not drink.  Review of Systems      See HPI  The patient denies anorexia, fever, weight loss, weight gain, vision loss, decreased hearing, hoarseness, chest pain, syncope, dyspnea on exertion, peripheral edema, prolonged cough, headaches, hemoptysis, abdominal pain, melena, hematochezia, severe indigestion/heartburn, hematuria, incontinence, muscle weakness, suspicious skin lesions, transient blindness, difficulty walking, depression, unusual weight  change, abnormal bleeding, enlarged lymph nodes, and angioedema.    Vital Signs:  Patient profile:   70 year old male Height:      74 inches Weight:      234.50 pounds BMI:     30.22 O2 Sat:      98 % on Room air Temp:     98.1 degrees F oral Pulse rate:   76 / minute BP sitting:   150 / 80  (left arm) Cuff size:   regular  Vitals Entered By: Randell Loop CMA (November 14, 2010 9:19 AM)  O2 Sat at Rest %:  98 O2 Flow:  Room air CC: 6 month ROV & review of mult medical problems... Is Patient Diabetic? No Pain Assessment Patient in pain? no      Comments no changes in meds today   Physical Exam  Additional Exam:  WD, Overweight, 70 y/o WM in NAD... GENERAL:  Alert & oriented; pleasant & cooperative. HEENT:  [place]/AT, EOM-wnl, PERRLA, Fundi-benign, EACs-clear, TMs-wnl, NOSE-clear, THROAT-clear & wnl. NECK:  Supple w/ fairROM; no JVD; normal carotid impulses w/o bruits; no thyromegaly or nodules palpated; no  lymphadenopathy. CHEST:  Clear to P & A; without wheezes/ rales/ or rhonchi. HEART:  Median sternotomy scar w/ improved keloid, regular rhythm; without murmurs/ rubs/ or gallops. ABDOMEN:  Soft & nontender; normal bowel sounds; no organomegaly or masses detected. 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 EXT: without deformities, mild arthritic changes; + venous insuffic changes, no edema. NEURO:  CN's intact; motor testing normal; sensory testing normal; gait normal & balance OK. DERM:  Keloid as noted, no other lesions...    MISC. Report  Procedure date:  11/14/2010  Findings:      Lipid Panel (LIPID)   Cholesterol               119 mg/dL                   7-829   Triglycerides             113.0 mg/dL                 5.6-213.0   HDL                  [L]  86.57 mg/dL                 >84.69   LDL Cholesterol           66 mg/dL                    6-29  BMP (METABOL)   Sodium                    138 mEq/L                   135-145   Potassium                 4.3 mEq/L                   3.5-5.1   Chloride                  104 mEq/L                   96-112   Carbon Dioxide            29 mEq/L  19-32   Glucose              [H]  108 mg/dL                   78-29   BUN                       17 mg/dL                    5-62   Creatinine                0.9 mg/dL                   1.3-0.8   Calcium                   8.6 mg/dL                   6.5-78.4   GFR                       85.08 mL/min                >60   Impression & Recommendations:  Problem # 1:  HYPERTENSION (ICD-401.9) Control is fair>  he notes BPs are better at home, asked to monitor carefully & call if BP >150/90... needs to elim sodium & get weight down... His updated medication list for this problem includes:    Diovan 160 Mg Tabs (Valsartan) .[name]... 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 Take 1 tablet by mouth once a day    Plavix 75 Mg Tabs (Clopidogrel bisulfate) .[name]... Take 1 tab by mouth once daily...    Diovan 160 Mg Tabs (Valsartan) .[name]... 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  Problem # 4:  HYPERCHOLESTEROLEMIA (ICD-272.0) He does not want to try Niaspan & prefers to continue Lip10 the same. His updated medication list for this problem includes:    Lipitor 20 Mg Tabs (Atorvastatin calcium) .[name]... Take as directed...  Orders: TLB-Lipid Panel (80061-LIPID) TLB-BMP (Basic Metabolic Panel-BMET) (80048-METABOL)  Problem # 5:  OVERWEIGHT (ICD-278.02) We discussed diet + exercise program...  Problem # 6:  COLONIC POLYPS (ICD-211.3) We will check w/ GI regarding his f/u colonoscopy. Orders: Gastroenterology Referral (GI)  Problem # 7:  OTHER MEDICAL PROBLEMS AS NOTED>>>  Complete Medication List: 1)  Adult Aspirin Low Strength 81 Mg Tbdp (Aspirin) .... Take 1 tablet by mouth once a day 2)  Plavix 75 Mg Tabs (Clopidogrel bisulfate) .... Take 1 tab by mouth once daily.Marland KitchenMarland Kitchen 3)  Diovan 160 Mg Tabs (Valsartan) .... Take one tablet once daily 4)  Lipitor 20 Mg Tabs (Atorvastatin calcium) .... Take as directed... 5)  Viagra 100 Mg Tabs (Sildenafil citrate) .... Use as directed 6)  Vitamin D3 2000 Unit Caps (Cholecalciferol) .... Take 1 cap by mouth once daily...  Patient Instructions: 1)  Today we updated your med list- see below.... 2)  We refilled your meds per request... 3)  Today we did your FASTING lipid panel... please call the "phone tree" in a few days for your lab results.Marland KitchenMarland Kitchen 4)  Let's get on track w/ our diet &  exercise program... the goal is to lose 15-20 lbs... 5)  Call for any problems.Marland KitchenMarland Kitchen 6)  Please schedule a follow-up appointment in 6 months. Prescriptions: VIAGRA 100 MG  TABS (SILDENAFIL CITRATE) use as  directed  #10 x prn   Entered and Authorized by:   Michele Mcalpine MD   Signed by:   Michele Mcalpine MD on 11/14/2010   Method used:   Print then Give to Patient   RxID:   [id number] LIPITOR 20 MG TABS (ATORVASTATIN CALCIUM) take as directed...  #30 x 12   Entered and Authorized by:   Michele Mcalpine MD   Signed by:   Michele Mcalpine MD on 11/14/2010   Method used:   Print then Give to Patient   RxID:   [id number] DIOVAN 160 MG TABS (VALSARTAN) take one tablet once daily  #30 x 12   Entered and Authorized by:   Michele Mcalpine MD   Signed by:   Michele Mcalpine MD on 11/14/2010   Method used:   Print then Give to Patient   RxID:   [id number] PLAVIX 75 MG TABS (CLOPIDOGREL BISULFATE) take 1 tab by mouth once daily...  #30 x 12   Entered and Authorized by:   Michele Mcalpine MD   Signed by:   Michele Mcalpine MD on 11/14/2010   Method used:   Print then Give to Patient   RxID:   [id number]    Immunization History:  Influenza Immunization History:    Influenza:  historical (10/18/2009)

## 2011-04-09 LAB — COMPREHENSIVE METABOLIC PANEL
ALT: 36 U/L (ref 0–53)
AST: 32 U/L (ref 0–37)
Albumin: 3.8 g/dL (ref 3.5–5.2)
Alkaline Phosphatase: 72 U/L (ref 39–117)
BUN: 13 mg/dL (ref 6–23)
CO2: 26 mEq/L (ref 19–32)
Calcium: 8.7 mg/dL (ref 8.4–10.5)
Chloride: 108 mEq/L (ref 96–112)
Creatinine, Ser: 0.95 mg/dL (ref 0.4–1.5)
GFR calc Af Amer: >60 mL/min (ref >60)
GFR calc non Af Amer: >60 mL/min (ref >60)
Glucose, Bld: 109 mg/dL — ABNORMAL HIGH (ref 70–99)
Potassium: 3.8 mEq/L (ref 3.5–5.1)
Sodium: 139 mEq/L (ref 135–145)
Total Bilirubin: 0.9 mg/dL (ref 0.3–1.2)
Total Protein: 6.8 g/dL (ref 6.0–8.3)

## 2011-04-09 LAB — DIFFERENTIAL
Basophils Absolute: 0 10*3/uL (ref 0.0–0.1)
Basophils Relative: 0 % (ref 0–1)
Eosinophils Absolute: 0.1 10*3/uL (ref 0.0–0.7)
Eosinophils Relative: 1 % (ref 0–5)
Lymphocytes Relative: 25 % (ref 12–46)
Lymphs Abs: 1.5 10*3/uL (ref 0.7–4.0)
Monocytes Absolute: 0.6 10*3/uL (ref 0.1–1.0)
Monocytes Relative: 10 % (ref 3–12)
Neutro Abs: 3.9 10*3/uL (ref 1.7–7.7)
Neutrophils Relative %: 64 % (ref 43–77)

## 2011-04-09 LAB — CBC
HCT: 43.8 % (ref 39.0–52.0)
HCT: 45.7 % (ref 39.0–52.0)
Hemoglobin: 15.3 g/dL (ref 13.0–17.0)
Hemoglobin: 15.9 g/dL (ref 13.0–17.0)
MCHC: 34.7 g/dL (ref 30.0–36.0)
MCHC: 34.8 g/dL (ref 30.0–36.0)
MCV: 99.1 fL (ref 78.0–100.0)
MCV: 99.5 fL (ref 78.0–100.0)
Platelets: 152 10*3/uL (ref 150–400)
Platelets: 161 10*3/uL (ref 150–400)
RBC: 4.41 MIL/uL (ref 4.22–5.81)
RBC: 4.61 MIL/uL (ref 4.22–5.81)
RDW: 13.5 % (ref 11.5–15.5)
RDW: 13.8 % (ref 11.5–15.5)
WBC: 6.1 10*3/uL (ref 4.0–10.5)
WBC: 7.2 10*3/uL (ref 4.0–10.5)

## 2011-04-09 LAB — BASIC METABOLIC PANEL
BUN: 11 mg/dL (ref 6–23)
CO2: 29 mEq/L (ref 19–32)
Calcium: 8.9 mg/dL (ref 8.4–10.5)
Chloride: 105 mEq/L (ref 96–112)
Creatinine, Ser: 1.01 mg/dL (ref 0.4–1.5)
GFR calc Af Amer: >60 mL/min (ref >60)
GFR calc non Af Amer: >60 mL/min (ref >60)
Glucose, Bld: 113 mg/dL — ABNORMAL HIGH (ref 70–99)
Potassium: 4.2 mEq/L (ref 3.5–5.1)
Sodium: 139 mEq/L (ref 135–145)

## 2011-04-09 LAB — URINE CULTURE

## 2011-04-09 LAB — PROTIME-INR
INR: 1 (ref 0.00–1.49)
Prothrombin Time: 13.3 seconds (ref 11.6–15.2)

## 2011-04-09 LAB — URINALYSIS, ROUTINE W REFLEX MICROSCOPIC
Bilirubin Urine: NEGATIVE
Glucose, UA: NEGATIVE mg/dL
Hgb urine dipstick: NEGATIVE
Ketones, ur: NEGATIVE mg/dL
Nitrite: NEGATIVE
Protein, ur: NEGATIVE mg/dL
Specific Gravity, Urine: 1.011 (ref 1.005–1.030)
Urobilinogen, UA: 1 mg/dL (ref 0.0–1.0)
pH: 7 (ref 5.0–8.0)

## 2011-04-09 LAB — APTT: aPTT: 30 seconds (ref 24–37)

## 2011-04-09 LAB — MAGNESIUM: Magnesium: 2.3 mg/dL (ref 1.5–2.5)

## 2011-04-09 LAB — POCT CARDIAC MARKERS
CKMB, poc: 1.7 ng/mL (ref 1.0–8.0)
Myoglobin, poc: 157 ng/mL (ref 12–200)
Troponin i, poc: <0.05 ng/mL (ref 0.00–0.09)

## 2011-04-09 LAB — PHOSPHORUS: Phosphorus: 3.5 mg/dL (ref 2.3–4.6)

## 2011-04-10 ENCOUNTER — Other Ambulatory Visit: Payer: Self-pay | Admitting: Cardiovascular Disease

## 2011-05-07 ENCOUNTER — Telehealth: Payer: Self-pay | Admitting: Pulmonary Disease

## 2011-05-07 DIAGNOSIS — Z125 Encounter for screening for malignant neoplasm of prostate: Secondary | ICD-10-CM

## 2011-05-07 DIAGNOSIS — E7800 Pure hypercholesterolemia, unspecified: Secondary | ICD-10-CM

## 2011-05-07 DIAGNOSIS — I10 Essential (primary) hypertension: Secondary | ICD-10-CM

## 2011-05-07 DIAGNOSIS — E663 Overweight: Secondary | ICD-10-CM

## 2011-05-07 DIAGNOSIS — D126 Benign neoplasm of colon, unspecified: Secondary | ICD-10-CM

## 2011-05-07 DIAGNOSIS — R7309 Other abnormal glucose: Secondary | ICD-10-CM

## 2011-05-07 NOTE — Telephone Encounter (Signed)
Pls advise on labs for this patient.

## 2011-05-08 DIAGNOSIS — Z125 Encounter for screening for malignant neoplasm of prostate: Secondary | ICD-10-CM | POA: Insufficient documentation

## 2011-05-08 NOTE — Telephone Encounter (Signed)
Per SN--ok for pt to have labs.  Labs are in the computer for him.  thanks

## 2011-05-08 NOTE — Telephone Encounter (Signed)
Spoke w/ pt and wife and pt was not at home. Wife states she will inform pt he can come for fasting labs. Nothing further was needed

## 2011-05-10 ENCOUNTER — Other Ambulatory Visit (INDEPENDENT_AMBULATORY_CARE_PROVIDER_SITE_OTHER): Payer: Medicare Other

## 2011-05-10 DIAGNOSIS — E7800 Pure hypercholesterolemia, unspecified: Secondary | ICD-10-CM

## 2011-05-10 DIAGNOSIS — I10 Essential (primary) hypertension: Secondary | ICD-10-CM

## 2011-05-10 DIAGNOSIS — R7309 Other abnormal glucose: Secondary | ICD-10-CM

## 2011-05-10 DIAGNOSIS — E663 Overweight: Secondary | ICD-10-CM

## 2011-05-10 DIAGNOSIS — Z125 Encounter for screening for malignant neoplasm of prostate: Secondary | ICD-10-CM

## 2011-05-10 DIAGNOSIS — D126 Benign neoplasm of colon, unspecified: Secondary | ICD-10-CM

## 2011-05-10 LAB — HEPATIC FUNCTION PANEL
ALT: 28 U/L (ref 0–53)
AST: 26 U/L (ref 0–37)
Albumin: 3.9 g/dL (ref 3.5–5.2)
Alkaline Phosphatase: 66 U/L (ref 39–117)
Bilirubin, Direct: 0.1 mg/dL (ref 0.0–0.3)
Total Bilirubin: 0.9 mg/dL (ref 0.3–1.2)
Total Protein: 6.7 g/dL (ref 6.0–8.3)

## 2011-05-10 LAB — BASIC METABOLIC PANEL
BUN: 14 mg/dL (ref 6–23)
CO2: 31 mEq/L (ref 19–32)
Calcium: 8.8 mg/dL (ref 8.4–10.5)
Chloride: 104 mEq/L (ref 96–112)
Creatinine, Ser: 0.8 mg/dL (ref 0.4–1.5)
GFR: 102.33 mL/min (ref >60.00)
Glucose, Bld: 97 mg/dL (ref 70–99)
Potassium: 4.4 mEq/L (ref 3.5–5.1)
Sodium: 142 mEq/L (ref 135–145)

## 2011-05-10 LAB — CBC WITH DIFFERENTIAL/PLATELET
Basophils Absolute: 0 10*3/uL (ref 0.0–0.1)
Basophils Relative: 0.3 % (ref 0.0–3.0)
Eosinophils Absolute: 0.2 10*3/uL (ref 0.0–0.7)
Eosinophils Relative: 2.5 % (ref 0.0–5.0)
HCT: 46 % (ref 39.0–52.0)
Hemoglobin: 15.9 g/dL (ref 13.0–17.0)
Lymphocytes Relative: 19.8 % (ref 12.0–46.0)
Lymphs Abs: 1.3 10*3/uL (ref 0.7–4.0)
MCHC: 34.6 g/dL (ref 30.0–36.0)
MCV: 101 fl — ABNORMAL HIGH (ref 78.0–100.0)
Monocytes Absolute: 0.6 10*3/uL (ref 0.1–1.0)
Monocytes Relative: 8.6 % (ref 3.0–12.0)
Neutro Abs: 4.5 10*3/uL (ref 1.4–7.7)
Neutrophils Relative %: 68.8 % (ref 43.0–77.0)
Platelets: 172 10*3/uL (ref 150.0–400.0)
RBC: 4.56 Mil/uL (ref 4.22–5.81)
RDW: 13.7 % (ref 11.5–14.6)
WBC: 6.5 10*3/uL (ref 4.5–10.5)

## 2011-05-10 LAB — TSH: TSH: 2.27 u[IU]/mL (ref 0.35–5.50)

## 2011-05-10 LAB — LIPID PANEL
Cholesterol: 118 mg/dL (ref 0–200)
HDL: 28.4 mg/dL — ABNORMAL LOW (ref >39.00)
LDL Cholesterol: 60 mg/dL (ref 0–99)
Total CHOL/HDL Ratio: 4
Triglycerides: 150 mg/dL — ABNORMAL HIGH (ref 0.0–149.0)
VLDL: 30 mg/dL (ref 0.0–40.0)

## 2011-05-10 LAB — PSA: PSA: 1.21 ng/mL (ref 0.10–4.00)

## 2011-05-10 LAB — HEMOGLOBIN A1C: Hgb A1c MFr Bld: 6 % (ref 4.6–6.5)

## 2011-05-15 NOTE — Procedures (Signed)
CAROTID DUPLEX EXAM   INDICATION:  TIA, abnormal MRI findings.   HISTORY:  Diabetes:  No.  Cardiac:  CABG.  Hypertension:  Yes.  Smoking:  No.  Previous Surgery:  No.  CV History:  TIA symptoms on 06/03/09.  Amaurosis Fugax No, Paresthesias No, Hemiparesis No.                                       RIGHT             LEFT  Brachial systolic pressure:         168               172  Brachial Doppler waveforms:         Normal            Normal  Vertebral direction of flow:        Antegrade         Antegrade  DUPLEX VELOCITIES (cm/sec)  CCA peak systolic                   84                79  ECA peak systolic                   108               108  ICA peak systolic                   127               164  ICA end diastolic                   35                30  PLAQUE MORPHOLOGY:                  Mixed             Mixed  PLAQUE AMOUNT:                      Mild              Mild/moderate  PLAQUE LOCATION:                    ICA/ECA/CCA       ICA/ECA/CCA   IMPRESSION:  40-59% stenosis of the bilateral internal carotid arteries.   ___________________________________________  Larina Earthly, M.D.   CH/MEDQ  D:  06/08/2009  T:  06/08/2009  Job:  [id number]

## 2011-05-15 NOTE — Consult Note (Signed)
NAMELESLEY, GALENTINE              ACCOUNT NO.:  [id number]   MEDICAL RECORD NO.:  [id number]          PATIENT TYPE:  INP   LOCATION:  3708                         FACILITY:  [facility]   PHYSICIAN:  Melina Fiddler, MDDATE OF BIRTH:  10-19-1941   DATE OF CONSULTATION:  06/03/2009  DATE OF DISCHARGE:                                 CONSULTATION   PRIMARY CARE PHYSICIAN:  Dr. Alroy Dust of [facility] in  [place], [place].   Mr. Lamorte is being seen today in consultation at the request of the  [facility] Department Physician for TIA.   HISTORY OF PRESENT ILLNESS:  This is a 70 year old pleasant Caucasian  male who is right-handed.  The patient states that he was standing in  his daughter's kitchen this afternoon with her at approximately 1:10  p.m. when he experienced  acute onset of left facial numbness.  This  felt like Novocaine.  The patient asked his daughter whether or not  his face was crooked and she stated yes it was when he smiled.  The  patient then went to the bathroom to look in the mirror and indeed his  facial smile was crooked.  These symptoms lasted approximately 10  minutes and then completely resolved.  The patient and his daughter then  left her home and got into his car in order to drive to his home.  The  patient states that while driving, he experienced acute onset of left  arm numbness and weakness.  When he arrived at home and went to get out  of the car, he also felt that his left lower extremity was heavy.  He  states that by the time he was able to walk to his front door,  approximately 1-2 minutes later, all of his symptoms had completely  resolved.  At that time, the patient called his wife and after further  discussion with his family, he was driven to the [facility] Emergency  Department for further evaluation.  Since that time, the patient denies  any further symptomatology of numbness, weakness, changes in speech,  changes in vision, or difficulties with balance.  He denies headache.  On arrival in the [facility] Emergency Department, his blood pressure  was significantly elevated at 194/93.  The patient reports that recently  his blood pressure medications were changed due to insurance coverage  issues.  For many years, he had been on Avapro and he was recently  switched to Diovan of 160 mg, 1 tablet daily.   OTHER PAST MEDICAL HISTORY:  1. Hyperlipidemia.  2. 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  3. Hypertension.  4. Erectile dysfunction.   CURRENT MEDICATIONS:  1. Lipitor 10 mg daily at bedtime.  2. Aspirin 81 mg daily.  3. Diovan 160 mg daily.   ALLERGIES:  The patient has no known drug allergies.   FAMILY HISTORY:  The patient's mother and father are both deceased.  His  mother had coronary artery disease and had her first MI at the age of  24, his mother also had numerous TIAs and stroke.  The patient's father  is deceased at the age of 73 secondary to lung cancer.   SOCIAL HISTORY:  He does not smoke tobacco products currently.  He has  no history of alcohol use or abuse or other illicit drug use.  He does  occasionally still chew tobacco products.  He is retired and lives in  his own home with his wife.   REVIEW OF SYSTEMS:  As detailed above under HPI, otherwise the balance  of 10 systems is unremarkable.   PHYSICAL EXAMINATION:  VITAL SIGNS:  Blood pressure 194/93 on arrival to  the [facility] Emergency Department, heart rate 82, respiratory rate 16.  The patient is afebrile at 98.3, sating 95% on room air.  HEENT:  Head is normocephalic, atraumatic.  Pupils are equal, round,  reactive to light and accommodation.  Extraocular movements are full  without nystagmus.  Oropharynx is clear of ulcers and exudate.  Palate  is symmetric and uvula is midline.  NECK:  Supple.  LUNGS:  Clear to auscultation  bilaterally.  HEART:  Regular rate and rhythm without murmurs, rubs, or gallops.  ABDOMEN:  Benign.  EXTREMITIES:  Warm, dry, and well perfused without clubbing, cyanosis,  or edema.  NEUROLOGIC:  The patient is alert and oriented x3.  Attention and  concentration are within normal limits.  Language is fluent.  Fund of  knowledge, both current events and past history is intact.  Cranial  nerves II through XII are grossly intact.  MUSCULOSKELETAL:  The patient exhibits normal tone and bulk.  Strength  is 5/5 throughout and all muscle groups tested bilateral and symmetric.  The patient does have a left pronator drift.  Toes are equivocal on the  right and downgoing on the left.  DTRs are 2+ at the ankles, 3+ at the  patellar tendons.  In the upper extremities, DTRs are 2+ at the biceps  and 1+ at the triceps and supinator.  Coordination is smooth pursuit on  finger-to-nose and heel-to-shin testing.  Gait is within normal limits  to casual walking.  Sensation is intact to pinprick and light touch  throughout.  The patient has mildly decreased vibratory sense in his  distal lower extremities to the ankle.  NIH stroke scale is 1 for  isolated left pronator drift.   LABORATORY STUDIES:  CBC is within normal limits.  Coagulation studies  are within normal limits.  INR is 1.0.  CHEM-7 is within normal limits  with the exception of a mildly elevated glucose of 109, calcium of 8.7.  LFTs are within normal limits.  Cardiac enzymes are negative x1 for  ischemia.  Urinalysis is unremarkable.  CT of the head showing small  vessel disease and a possible right basal ganglia infarct.  Chest x-ray  shows changes consistent with chronic COPD and mild cardiomegaly.  There  are no acute pulmonary changes.   ASSESSMENT AND PLAN:  This is a very pleasant 70 year old Caucasian male  with risk factors for transient ischemic attack and stroke including  coronary artery disease, status post CABG,  hyperlipidemia, hypertension,  positive family history, a h/o tabacco use and age.  The patient  presents with classic symptoms of TIA.  We would recommend admission to  the hospital for at least 24-48 hours and close monitoring with  transient ischemic attack symptoms.  The patient should have telemetry  to assess for underlying arrhythmia such as atrial fibrillation.  He  should have frequent neuro checks and vital signs.  We would recommend a  routine stroke workup including MRI, MRA of the brain, carotid Dopplers,  and transthoracic echocardiogram.  Routine stroke labs such as fasting  lipids, hemoglobin A1c, hs-CRP, TSH, and homocysteine.  One could  consider changing the patient's antiplatelet therapy from 81 mg of  aspirin to Plavix 75 mg daily.  This may need to be discussed with his  cardiologist.  At present, I would allow permissive hypertension in the  setting of stuttering transient ischemic attack symptoms so as not to  precipitate acute/further ischemia.  The patient should receive p.r.n.  labetalol for systolic blood pressures greater than 185 and diastolic  pressures greater than 110.  Prior to discharge, he may require  additional blood pressure management as he most likely has a right basal  ganglia lacunar infarct, which is most often secondary to poorly  controlled hypertension.  Of note, recently, the patient did have his  antihypertensive medication changed from Avapro to Diovan due to  difficulties with insurance coverage.  This patient will be followed by  [facility] in the a.m.      Melina Fiddler, MD  Electronically Signed     NA/MEDQ  D:  06/03/2009  T:  06/04/2009  Job:  [id number]

## 2011-05-15 NOTE — Assessment & Plan Note (Signed)
OFFICE VISIT   [name] [name], [name] [name]  DOB:  [birth year]                                       06/09/2010  ZOXWR#:[id number]   Patient is a very pleasant 70 year old gentleman with extracranial  cerebrovascular occlusive disease.  He is seen in follow-up for  surveillance of his carotid occlusive disease.  Over the past year, he  states that he has had no changes in his physical status.  He has had no  further TIA-like symptoms, amaurosis fugax.   Past medical history is significant for hypertension,  hypercholesterolemia.  He did undergo coronary artery bypass grafting in  02/2004 by Dr. Kathlee Nations Trigt.  He has remained stable since that time.   SOCIAL HISTORY:  He is married and a retired Education administrator.  He does not use  tobacco or alcohol products.   Review of systems was entirely negative.   Physical findings revealed a very pleasant gentleman who appeared well-  nourished and in no apparent distress.  Heart rate was 72.  Blood  pressure 161/90.  O2 saturation was 97%.  HEENT:  PERRLA, EOMI.  Sclerae  was nonicteric.  Mucous membranes were pink and moist.  Lungs were clear  to auscultation.  Cardiac exam revealed a regular rate and rhythm.  He  had a well-healed median sternotomy incision as well as a well-healed  incision from harvest of a right radial artery and 2 very well-healed  small incisions about the knee which was for an apparent saphenectomy  during his coronary artery bypass procedure.  Abdomen was soft,  nontender, nondistended.  Musculoskeletal exam demonstrated no major  deformities.  Neurologic exam was nonfocal.  Skin demonstrated no ulcers  or skin loss.   LABORATORY EXAM:  He did undergo carotid duplex exam.  This demonstrated  40% to 59% narrowing bilaterally.  There was antegrade flows in his  vertebrals bilaterally.  This does not represent a change over the past  year.   ASSESSMENT/PLAN:  [name] [name] remains stable in his carotid  occlusive  disease.  We will continue our surveillance with another carotid  ultrasound in 1 year.   Wilmon Arms, PA   Larina Earthly, M.D.  Electronically Signed   KEL/MEDQ  D:  06/09/2010  T:  06/09/2010  Job:  [id number]

## 2011-05-15 NOTE — Assessment & Plan Note (Signed)
[facility]                            CARDIOLOGY OFFICE NOTE   [name] [name], [name] [name]                     MRN:          [id number]  DATE:06/07/2008                            DOB:          1941-04-15    [name] [name] returns today for followup, status post CABG 4 years ago.  He is  doing well.  He had a significant keloid last time I saw him.  Fortunately, he has seen Dr. Stephens November and had it revised.  He is  really happy he did this.  He can now stand straight.  He is not having  significant chest pain, PND, or orthopnea.  There has been no lower  extremity edema.  Unfortunately, his weight continues to be very high.  He eats somewhat poorly, but his big problem is he is not exercising at  all.   We had a long discussion about this and his diet.   The patient is otherwise doing well.  He has been compliant with his  medications.  His erectile dysfunction is a bit better with lower dose  atenolol.  He had some issues with the cost of his Avapro.  He is  concerned about a cough with an ACE inhibitor, although he has never had  one.  I have told him for the time being we would increase his Avapro to  300 mg a day and stop his atenolol.  If he does not like this change, we  can always go back.   His review of systems is otherwise negative.   MEDICATIONS:  1. Lipitor 10 mg a day.  2. Aspirin a day.  3. Avapro 150 a day.  Avapro to be increased to 300 a day.  4. Atenolol 25 a day, to be stopped.  He uses [facility] in [place].   EXAM:  Remarkable for an overweight white male in no distress. Weight is  232, blood pressure is 145/77, pulse 57 and regular, respiratory rate  14, and afebrile.  HEENT:  Unremarkable.  NECK:  Carotids normal without bruit. No lymphadenopathy, thyromegaly,  or JVP elevation.  LUNGS:  Clear.  Good diaphragmatic motion.  No wheezing.  HEART:  S1 and S2 with normal heart sounds. Sternum is well healed.  Keloid revision scar  actually looks quite good with only a small eschar.  ABDOMEN:  Benign.  Bowel sounds positive.  No AAA.  No bruits.  No  hepatosplenomegaly.  No hepatojugular reflux.  No tenderness.  EXTREMITIES:  Distal pulses are intact.  Mild varicosities.  Trace  edema.  Poor nailbed care in both feet.  NEURO:  Nonfocal.  SKIN:  Warm and dry.  No muscular weakness.   EKG normal except for sinus bradycardia.   IMPRESSION:  1. 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  Follow up Myoview in      a year.  Continue aspirin therapy.  2. Erectile dysfunction.  Stop atenolol.  Follow up with primary care      MD.  3. Hypertension.  Increase Avapro to 300 a day.  Samples given.      Atenolol  being cut back due to side effects.  4. Hyperlipidemia.  LDL cholesterol 67, which is fine.  Continue      Lipitor.  HDL low.  Increase exercise.  I will see the patient back      in a year.     Noralyn Pick. Eden Emms, MD, [facility]  Electronically Signed    PCN/MedQ  DD: 06/07/2008  DT: 06/08/2008  Job #: [id number]

## 2011-05-15 NOTE — Consult Note (Signed)
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  He is a very pleasant 70 year old gentleman who on  June 4th had an episode where for approximately 5 minutes, he had left  lip numbness and facial numbness.  He reports this lasted for  approximately 5 minutes, and he looked in the mirror.  He did have a  facial droop.  He then noted that he had some weakness in his left hand,  also lasting approximately 5 minutes.  He presented to the [facility]  Emergency Room and was admitted overnight, where he had an MRI/MRA, CT  scan.  His evaluation revealed probable small vessel disease.  He did  have an MRA which showed no evidence of stenosis in his right carotid.  He has questionable stenosis in his left carotid by MRA.  He never had  prior neurological deficits.  He has returned completely to his baseline  within less than an hour and has had no further problems since last  Friday.   PAST MEDICAL HISTORY:  Significant for elevated blood pressure and  elevated cholesterol.  He did have coronary artery bypass grafting in  February, 2005 by Dr. Kathlee Nations Trigt.  He has remained stable since  that time.  He does have a premature history of atherosclerotic disease  in his mother and brother.   SOCIAL HISTORY:  He is married.  He is a retired Education administrator.  He does not  smoke or drink alcohol.  He does report that he had started with  smokeless tobacco back again after his heart bypass over the past  several months and has now stopped this again.   REVIEW OF SYSTEMS:  Weight is reported at 235 pounds.  He is 6 foot 2  inches tall.  He does have a history of prior bronchitis but no other  positive review of systems.  He had been on aspirin and is now taking  daily Plavix as well.  He is on Lipitor and  Diovan.   He has had no known drug allergies.   PHYSICAL EXAMINATION:  He is a well-developed, moderately obese white  male in no acute distress.  He is grossly intact neurologically.  His  blood pressure is 170/70 in his left arm and 170/82 in his right arm.  Pulse is 72.  Respirations 18.  Carotid arteries without bruits  bilaterally.  He has 2+ right radial pulse.  He has harvested radial  artery on the left arm.  His dorsalis pedis pulses are 2+ bilaterally.   I reviewed his MRI and CT.  He also underwent a carotid duplex in our  office today.  This showed bilateral 40 to 59% stenosis on the lower end  of this range bilaterally.  I discussed this at length with the patient  and his wife, present.  I explained that MRI frequently over-calls the  degree of stenosis, and it appears to have on his left internal carotid  artery.  I explained that with his moderate bilateral internal carotid  artery, asymptomatic stenosis, would recommend that we see him in 1 year  with repeat carotid duplex to rule out any progression.  He  had multiple  questions regarding the need for aspirin and Plavix, and I felt that  this was important currently since he suffered this event most likely  related to small vessel disease while on aspirin therapy alone.  He also  questioned the need for a 2D echocardiogram which had been suggested,  and I deferred this to Dr. Kriste Basque.  He does not have any history of  cardiac irregularity and therefore would be at low risk for cardiogenic  emboli.  I plan to see him again in 1 year with repeat carotid duplex.   Larina Earthly, M.D.  Electronically Signed   TFE/MEDQ  D:  06/08/2009  T:  06/08/2009  Job:  [id number]   cc:   Lonzo Cloud. Kriste Basque, MD

## 2011-05-15 NOTE — Discharge Summary (Signed)
[name] [name], [name] [name]              ACCOUNT NO.:  [id number]   MEDICAL RECORD NO.:  [id number]          PATIENT TYPE:  INP   LOCATION:  3708                         FACILITY:  [facility]   PHYSICIAN:  Clinton D. Maple Hudson, MD, FCCP, FACPDATE OF BIRTH:  1941/07/26   DATE OF ADMISSION:  06/03/2009  DATE OF DISCHARGE:  06/04/2009                               DISCHARGE SUMMARY   DISCHARGE DIAGNOSES:  1. Transient ischemic attack (left face and arm numbness).  2. Atherosclerosis.      a.     Status post coronary artery bypass graft.      b.     Left internal carotid artery lesion.  3. Hypertension not otherwise specified.  4. Hyperlipidemia.   BRIEF HISTORY:  A 70 year old white male followed by Dr. Alroy Dust who  on the day of admission developed acute onset of left face numbness with  asymmetric smile lasting about 10 minutes with spontaneous resolution.  Symptoms returned as he got out of the car and he felt heavy, this time  lasting about 2 minutes.  Family drove him to the emergency room.  He  was evaluated by stroke protocol and seen by Neurology with assessment  that he had a classic TIA with recommended workup to include MRI/MRA of  the brain, carotid Dopplers, and transtracheal echocardiogram.  He was  begun on Plavix. Note was made that he had recently changed from Avapro  to Diovan due to insurance.  On assessment the next day, he was symptom-  free and pressing to go home.  BP was 132/74, pulse of 60, room air  saturation 95%.  Labs were unremarkable noting BUN of 11, creatinine of  1.01.   LABORATORY DATA:  The initial verbal report from neurologist, Dr.  Thad Ranger rounding today was that MRA of brain and neck showed no acute  stroke and no critical stenosis, but 60% defect in the left internal  carotid artery needing followup Doppler.  The right ICA was clear.  Dr.  Thad Ranger approved discharge with recommendation that aspirin be replaced  with Plavix.   DISCHARGE CONDITION:   Improved.   Medications, test results, and lifestyle issues were discussed with the  patient and his wife.   Time for discharge management exceeded 30 minutes.   PLAN:  1. He will follow up with Dr. Kriste Basque to arrange outpatient Doppler of      carotids and echocardiogram.  2. Activity gradually returned to unrestricted with advice to avoid      dehydration.  3. Heart-healthy diet.  4. Medications:  Plavix 75 mg daily, Lipitor 10 mg daily, Diovan 160      mg daily, Tylenol if needed for pain.  He is not to take aspirin      unless approved by Dr. Kriste Basque.      Clinton D. Maple Hudson, MD, [facility], FACP  Electronically Signed     CDY/MEDQ  D:  06/04/2009  T:  06/05/2009  Job:  [id number]

## 2011-05-15 NOTE — Procedures (Signed)
CAROTID DUPLEX EXAM   INDICATION:  F/U  Carotid  Artery  Disease   HISTORY:  Diabetes:  No.  Cardiac:  CABG.  Hypertension:  Yes.  Smoking:  No.  Previous Surgery:  No.  CV History:  TIA symptoms on 06/03/2009.  Amaurosis Fugax No, Paresthesias No, Hemiparesis No                                       RIGHT             LEFT  Brachial systolic pressure:         160               158  Brachial Doppler waveforms:         WNL               WNL  Vertebral direction of flow:        Antegrade         Antegrade  DUPLEX VELOCITIES (cm/sec)  CCA peak systolic                   79                111  ECA peak systolic                   133               170  ICA peak systolic                   130 (mid)         167  ICA end diastolic                   38                38  PLAQUE MORPHOLOGY:                  Heterogeneous     Heterogeneous  PLAQUE AMOUNT:                      Mild              Mild  PLAQUE LOCATION:                    ICA               ICA, ECA   IMPRESSION:  1. 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  2. Antegrade flow in bilateral vertebrals.         ___________________________________________  Larina Earthly, M.D.   CB/MEDQ  D:  06/09/2010  T:  06/09/2010  Job:  [id number]

## 2011-05-15 NOTE — Assessment & Plan Note (Signed)
[facility]                            CARDIOLOGY OFFICE NOTE   WARDELL, POKORSKI                     MRN:          [id number]  DATE:06/19/2007                            DOB:          08-25-1941    [name] [name] returns today for followup. He is status post treatment of  coronary artery bypass surgery in 2005. He has hypertension and  hyperlipidemia.   Since I last saw him, he has been doing fairly well. We cut back his  Toprol to 25 mg a day in light of some erectile dysfunction. He does not  seem to think that that has made much of a difference. When I last saw  him, I was concerned about the keloid at the lower edge of his sternum.  He actually has fusion of the lower edge of his sternum with the  epigastric area. This has been somewhat painful. The pain has been  increasing lately. It is very difficult for him to stand up straight. I  am concerned about his overall posture and development of cervical spine  problems. I am also concerned about chronic pain syndrome developing.   The last time I saw the patient I strongly urged him to have plastic  surgery consult to revise this area. I told him this was not a cosmetic  surgery and that I thought it was a medical necessity in regards to both  pain control, posture and preventing further cervical spine problems.   Hopefully, the patient will follow up with this.   REVIEW OF SYSTEMS:  Is remarkable for some left breast tenderness. He  has had an inverted nipple on that side before and has been worked up by  Dr.  Kriste Basque. He has not noted any medications that cause this. He  actually had a mammogram, which was normal.   The patient's review of systems otherwise negative.   MEDICATIONS:  1. Toprol 25 a day.  2. Lipitor 10 a day.  3. Aspirin a day.  4. Avapro 150 a day.   His blood pressure is somewhat elevated today at 158/68, pulse 70 and  regular. He is a healthy-appearing white male. He is  in some distress  from the pain from his sternal site. He does have a bit of poor posture.  He cannot sit up straight due to the pain and tension on the keloid. The  blood pressure is somewhat elevated for him. He says he takes his blood  pressure at home and he normally runs under 140 systolic. He is  afebrile. Respiratory rate is 16. Pulse 80 and regular.  HEENT: Is normal. Carotids are normal without bruit. JVP is not  elevated.  There is no thyromegaly. No lymphadenopathy.  LUNGS:  Are clear with normal diaphragmatic motion.  There is an S1, S2 with normal heart sounds.  The sternum is well-healed except for this keloid area as indicated. It  is extremely tender and cord like and fuses the lower end of his sternum  with the epigastrium. He does have a flat or inverted nipple in the left  breast with no tenderness  or masses palpated.  ABDOMEN: Is protuberant, bowel sounds positive. No AAA. No  hepatosplenomegaly or hepatojugular reflux. No organomegaly or  tenderness.  Pulses are +3 bilaterally with no bruits and no edema.  NEURO: Is nonfocal. There is no muscular weakness.   His EKG is entirely normal.   IMPRESSION:  1. Stable status post coronary artery bypass graft. No significant      chest pain. Normal baseline EKG. Followup visit in a year. No need      for a stress testing.  2. Keloid formation. Needs to be revised. Strongly urged the patient      to see a Engineer, petroleum.  3. Hypertension, borderline control. Consider increasing avapro to 300      a day. Followup with Dr.  Kriste Basque. Continue low-salt diet.  4. Hyperlipidemia. Continue statin drug. Followup lipid and liver      profile in six months.     Noralyn Pick. Eden Emms, MD, [facility]  Electronically Signed    PCN/MedQ  DD: 06/19/2007  DT: 06/19/2007  Job #: [id number]

## 2011-05-15 NOTE — H&P (Signed)
NAMELEXUS, BARLETTA              ACCOUNT NO.:  [id number]   MEDICAL RECORD NO.:  [id number]          PATIENT TYPE:  EMS   LOCATION:  MAJO                         FACILITY:  [facility]   PHYSICIAN:  Felipa Evener, MD  DATE OF BIRTH:  Aug 23, 1941   DATE OF ADMISSION:  06/03/2009  DATE OF DISCHARGE:                              HISTORY & PHYSICAL   CHIEF COMPLAINT:  Left-sided weakness and slurred speech.   BRIEF HISTORY:  This is a 70 year old left-handed male patient who  presents today reporting sudden onset specifically at approximately 1310  this afternoon of left-sided facial numbness, this included his tongue.  It also included some left-sided facial drooping and slurred speech.  This sensation lasted for approximately 10 minutes and spontaneously  subsided.  The patient subsequently was driving in his car with his  granddaughter when at approximately 1325 in the afternoon, he developed  left arm numbness from the elbow down.  At that point, he did not recall  specific speech impairment; however, when he got out of his car he also  noted his left leg to be quite heavy.  He reported this sensation also  spontaneously resolved after 10 minutes, but because of these findings  and concern for potential stroke, he reported to the emergency room with  these complaints.   PAST MEDICAL HISTORY:  1. Dyslipidemia.  2. Asthmatic bronchitis.  3. Pilonidal cyst removal in 1969.  4. 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 Donata Clay in 2005.  He is followed by Dr. Charlton Haws with [facility], and was actually last seen on June 07, 2008.   ADDITIONAL HISTORY:  Hypertension.   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  He is a nonsmoker, nondrinker.  He lives a  very active lifestyle, exercising 4-5 times weekly, actually will walk  briskly in excess of 2 miles a day  during exercise.   FAMILY HISTORY:  Apparently of grandfather had CVA in the past, there is  coronary artery disease in the family.   HOME MEDICATIONS:  1. Lipitor 10 mg daily.  2. Aspirin 81 mg daily.  3. Avapro 150 mg daily with plans to change this to 300 mg per Dr.      Eden Emms.  4. He has recently been discontinued from atenolol.   ALLERGIES:  No known drug allergies.   REVIEW OF SYSTEMS:  CONSTITUTIONAL:  Negative for fever, chills, sweats,  weight loss, or adenopathy.  HEENT:  As mentioned above, he did have  facial numbness, however, no other pertinent positives.  Denied  photophobia, hearing loss, or vertigo.  SKIN:  Within normal limits.  CARDIAC/PULMONARY:  No chest pain, shortness of breath, dyspnea.  Denies  specifically any palpitations or chest discomfort.  GU:  Within normal  limits.  NEUROPSYCHIATRIC EVALUATION:  See pertinent positives in the  history of present illness exam.  MUSCULOSKELETAL:  Currently reports  within normal limits, however,  did have left-sided weakness as  previously mentioned.  GI:  Within normal limits.  ENDOCRINE:  Denies  any heat or cold intolerance.   PHYSICAL EXAMINATION:  VITAL SIGNS:  Temperature 98.3, heart rate 82  currently normal sinus rhythm on telemetry at bedside, respirations 16,  blood pressure 166/72, saturations 98% on 2 L.  GENERAL:  This is a well-developed, obese 70 year old white male patient  currently resting comfortably in the bed, in no acute distress.  HEENT:  He is normocephalic.  Pupils are equal, reactive to light.  NECK:  He has no JVD or adenopathy.  PULMONARY:  Clear to auscultation.  CARDIAC:  Regular rate and rhythm without murmur, rub, or gallop.  ABDOMEN:  Soft, nontender, positive bowel sounds.  GU:  Voiding spontaneously.  NEUROLOGICAL:  Currently intact.  He is alert and oriented x3.  His  cranial nerves are not grossly intact.  Strengths are 5/5 in all  extremities.   DIAGNOSTIC EVALUATION:  This  was personally reviewed.  CT of head  without contrast demonstrating chronic-appearing small vessel changes in  the hemispheric, white matter.  There is some question about acute  versus subacute infarct in the right basal ganglia/anterior limb of the  internal capsule.  No blood is seen.  Chest x-ray obtained on June 03, 2009, demonstrates cardiomegaly, no acute infiltrates or edema.  PT/INR  is 13.3/1.0.  Sodium 139, potassium 3.8, chloride 108, CO2 28, glucose  109, BUN 13, creatinine 0.95.  White blood cell count 6.1, hemoglobin  15.9, hematocrit 45.7, platelet count 161.   IMPRESSION AND PLAN:  1. Acute right-sided cerebrovascular accident versus transient      ischemic attack.  Mr. Medlock will be admitted to the inpatient      setting for further evaluation and therapy.  This evaluation will      include MRI of brain, he will be admitted to telemetry to rule out      potential occult atrial fibrillation which may have not been      identified in the past, and also have bilateral carotid ultrasounds      completed.  Additionally, he will have neurology consultation,      requested.  Dr. Thad Ranger has been contacted and we will initiate      aspirin and Plavix.  At this point, we will continue his Lipitor,      however, we will hold off on his blood pressure medications which      he usually takes at home.  2. Hypertension as mentioned above.  He is on Diovan at home, we will      hold off on this at this point.  3. Hyperlipidemia.  Plan for this is to continue Lipitor.  4. History of chronic bronchitis.  We will prescribe scheduled      bronchodilators for this.   DISPOSITION:  Mr. Muehl is currently awaiting the inpatient admission  for further evaluation and therapy.      Zenia Resides, NP      Marius Ditch Jefm Miles, MD  Electronically Signed   PB/MEDQ  D:  06/03/2009  T:  06/04/2009  Job:  [id number]   cc:   Noralyn Pick. Eden Emms, MD, [facility]

## 2011-05-16 ENCOUNTER — Encounter: Payer: Self-pay | Admitting: Pulmonary Disease

## 2011-05-16 ENCOUNTER — Ambulatory Visit (INDEPENDENT_AMBULATORY_CARE_PROVIDER_SITE_OTHER): Payer: Medicare Other | Admitting: Pulmonary Disease

## 2011-05-16 DIAGNOSIS — I679 Cerebrovascular disease, unspecified: Secondary | ICD-10-CM

## 2011-05-16 DIAGNOSIS — D126 Benign neoplasm of colon, unspecified: Secondary | ICD-10-CM

## 2011-05-16 DIAGNOSIS — E7800 Pure hypercholesterolemia, unspecified: Secondary | ICD-10-CM

## 2011-05-16 DIAGNOSIS — I2510 Atherosclerotic heart disease of native coronary artery without angina pectoris: Secondary | ICD-10-CM

## 2011-05-16 DIAGNOSIS — G459 Transient cerebral ischemic attack, unspecified: Secondary | ICD-10-CM

## 2011-05-16 DIAGNOSIS — R7309 Other abnormal glucose: Secondary | ICD-10-CM

## 2011-05-16 DIAGNOSIS — I10 Essential (primary) hypertension: Secondary | ICD-10-CM

## 2011-05-16 DIAGNOSIS — E663 Overweight: Secondary | ICD-10-CM

## 2011-05-16 MED ORDER — VALSARTAN 160 MG PO TABS: 160.0000 mg | ORAL_TABLET | Freq: Every day | ORAL | Status: DC

## 2011-05-16 MED ORDER — ATORVASTATIN CALCIUM 20 MG PO TABS: 20.0000 mg | ORAL_TABLET | Freq: Every day | ORAL | Status: DC

## 2011-05-16 MED ORDER — SILDENAFIL CITRATE 100 MG PO TABS: 100.0000 mg | ORAL_TABLET | Freq: Every day | ORAL | Status: DC

## 2011-05-16 MED ORDER — CLOPIDOGREL BISULFATE 75 MG PO TABS: 75.0000 mg | ORAL_TABLET | Freq: Every day | ORAL | Status: DC

## 2011-05-16 NOTE — Progress Notes (Signed)
Subjective:    Patient ID: [name] [name], male    DOB: Sep 05, 1941, 70 y.o.   MRN: [id number]  HPI 70 y/o WM here for a follow up visit... he has multiple medical problems as noted below...    ~  Jun10:  Hosp w/ TIA w/ left face & arm symptoms that resolved quickly... CT showed small vessel disease;  MRI showed bilat remote lacunar infarcts, no acute infarct, +sm vessel dis;  MRA showed intracranial atherosclerotic changes, & ?focal stenosis (?70%) of left ICA in the neck... 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  he states that he stopped the Plavix after just several days due to swelling in his feet that resolved off the Plavix Rx- we discussed this and I strongly rec that he restart the Plavix due to his recent TIA...  ~  May 16, 2010:  still not dieting or exercising & wt stable ~233#> we discussed needed diet + exercise program for weight reduction... BP controlled on the Diovan;  denies angina etc & due for f/u DrNishan soon;  he continues on the ASA/ Plavix w/o TIAs etc;  FLP looks good on Lip10 x low HDL & rec for incr exercise...   ~  November 14, 2010:  he's had a good 67mo- no new complaints or concerns... he saw [name] [name] 6/11 for yearly f/u doing satis & no changes made... he mentioned that [name] [name] wanted to decr Lipitor10 to 5mg /d since his LDL was in the 60's & add Niaspan to help raise his HDL but he refuses Niacin preps due to HA & flushing, and he prefers to continue Lip10 daily... BP sl elev today but he states all 130s/ 70s at home;  weight still in the 235# range & we reviewed diet + exercise perscription for him;  there have been no interval cerebral ischemic events on ASA & Plavix...   ~  May 16, 2011:  67mo ROV & he's been stable overall but noted BP up yest w/ stress (wife had hand surg)- refuses anxiolytic med, states BP ret to norm on it's own (he also takes Doivan160);  Stable on ASA/  Plavix w/o cerebral ischemic symptoms & he is due f/u w/ [name] soon ;  FLP looks good on Lip10 daily & BS/ A1c are normal on diet alone, but wt is only down 6# to 229# today... Requests refills for 30d supplies.          Problem List:    HYPERTENSION (ICD-401.9) - controlled on DIOVAN 160mg /d... BP's ave 130's/70's at home he says... ~  Jun09:  prev on Atenolol25mg - stopped due to side effects & feels much better off BBlockers. ~  2/10:  Avapro300 changed to Diovan160 per his insurance company for $$$ reasons. ~  11/11:  BP= 150/80 & remains asymptomatic, doesn't want to incr meds- discussed diet/ exerc... ~  5/12:  BP= 132/70 & denies HA, fatigue, visual changes, CP, palipit, dizziness, syncope, dyspnea, edema, etc...  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 on ASA/ PLAVIX & followed by [name] [name] & his notes reviewed>  he is intol to BBlocker therapy as noted. ~  5/12:  stable & currently denies angina, palpit, SOB, edema, etc...  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 followed by [name] [name] for VVS. 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  VENOUS INSUFFICIENCY (ICD-459.81) - he is aware of need to elim salt, elevate legs, wear support hose.  HYPERCHOLESTEROLEMIA (ICD-272.0) - controlled on LIPITOR 20- 1/2 tab daily... he has tried NIACIN in the past and refuses to take it again due to headaches. ~  FLP 5/08 showed TChol 125, TG 171, HDL 24, LDL 67...  ~  FLP 12/08 showed TChol 107, TG 131, HDL 23, LDL 58... rec- same med, better diet & exercise. ~  FLP 2/10 on Lip10 (wt=235#) showed TChol 119, TG 158, HDL 24, LDL 63... rec- ditto ~  FLP 11/10 on Lip10 (wt=236#) showed TChol 115, TG 128, HDL 28, LDL 62 ~  FLP 5/11 on Lip10 (wt=233#) showed TChol 119, TG 144, HDL 27, LDL 63 ~  DrNishan suggested that he cut the Lipitor to 5mg /d & add  Niaspan, but he refuses the Niacin preps due to HAs & wants to contin Lip10. ~  FLP 11/11 on Lip10 (wt=235#) showed TChol 119, TG 113, HDL 31, LDL 66 ~  FLP 5/12 on Lip10 (wt=229#) showed TChol 118, TG 150, HDL 28, LDL 60  DIABETES MELLITUS, BORDERLINE (ICD-790.29) - FBS in the 110-125 range... On diet Rx alone... ~  labs in hosp 6/10 showed BS= 109-113 ~  labs 11/10 showed BS= 109, A1c= 5.9 ~  labs 5/11 showed BS= 107 ~  labs 11/11 showed BS= 108 ~  Labs 5/12 showed BS= 97, A1c= 6.0  OVERWEIGHT (ICD-278.02) - he is 6'2" tall and weighs ~230# for a BMI of 30... we have discussed diet & exercise stategies.  COLONIC POLYPS (ICD-211.3) - last colonoscopy 10/07 by [name] showed several 5-6 mm polyps... path= tubular adenomas ... f/u planned 5 years.  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 Rx w/ PT.  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 ~  labs 2/10 showed Vit D level = 14... rec to start 2000 u daily.  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 he had plastic surg by [facility] 5/09 w/ improved scar.  NEURODERMATITIS (ICD-698.3) HEALTH MAINTENANCE: ~  GI:  followed by [name] & last colonoscopy was 10/07 w/ f/u planned 41yrs. ~  GU:  he is up to date on DRE & PSA checks here (PSA remains in the 1-2  range). ~  Immunizations:  he had PNEUMOVAX & TETANUS in 2000 w/ repeat PNEUMOVAX 11/10 & TDAP 5/11... he gets the yearly Seasonal Flu vaccine each fall...   Past Surgical History  Procedure Date  . Pilonidial cystectomy 1989  . 4 vessel cabg 02/2004    Dr. Maren Beach  . Keloid surgery on chest scar 04/2008    Dr. Stephens November    Outpatient Encounter Prescriptions as of 05/16/2011  Medication Sig Dispense Refill  . aspirin 81 MG tablet Take 81 mg by mouth daily.        [name]  atorvastatin (LIPITOR) 20 MG tablet Take 20 mg by mouth daily.        . Cholecalciferol (VITAMIN D) 2000 UNITS CAPS Take 1 capsule by mouth daily.        . clopidogrel (PLAVIX) 75 MG tablet Take 75 mg by mouth daily.        [name] DIOVAN 160 MG tablet TAKE 1 TABLET BY MOUTH EVERY DAY  30 tablet  12  . sildenafil (VIAGRA) 100 MG tablet Use as directed         Allergies  Allergen Reactions  . Niacin     REACTION: intol to NIACIN w/ headaches    Review of Systems         See HPI - all other systems neg except as noted... The patient denies anorexia, fever, weight loss, weight gain, vision loss, decreased hearing, hoarseness, chest pain, syncope, dyspnea on exertion, peripheral edema, prolonged cough, headaches, hemoptysis, abdominal pain, melena, hematochezia, severe indigestion/heartburn, hematuria, incontinence, muscle weakness, suspicious skin lesions, transient blindness, difficulty walking, depression, unusual weight change, abnormal bleeding, enlarged lymph nodes, and angioedema.     Objective:   Physical Exam    WD, Overweight, 70 y/o WM in NAD... Vital Signs:  Reviewed> GENERAL:  Alert & oriented; pleasant & cooperative. HEENT:  [place]/AT, EOM-wnl, PERRLA, Fundi-benign, EACs-clear, TMs-wnl, NOSE-clear, THROAT-clear & wnl. NECK:  Supple w/ fairROM; no JVD; normal carotid impulses w/o bruits; no thyromegaly or nodules palpated; no lymphadenopathy. CHEST:  Clear to P & A; without wheezes/ rales/ or rhonchi. HEART:   Median sternotomy scar w/ improved keloid, regular rhythm; without murmurs/ rubs/ or gallops. ABDOMEN:  Soft & nontender; normal bowel sounds; no organomegaly or masses detected. 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 EXT: without deformities, mild arthritic changes; + venous insuffic changes, no edema. NEURO:  CN's intact; motor testing normal; sensory testing normal; gait normal & balance OK. DERM:  Keloid as noted, no other lesions...   Assessment & Plan:   [name]  He does not want additional meds or anxiolytics; advised no salt, get wt down, continue Diovan regularly...  CEREBROVASC DIS/ TIA>  No further cerebral ischemic symptoms on the ASA/ Plavix; due for f/u [name] in June'  Continue same meds...  CHOL>  On Lip10 & stable; needs better low fat diet & get wt down!  DM> adeq control w/ diet alone...  GI>  He will be due colonoscopy 10/12 per [name]...  Other medical problems as noted.Marland KitchenMarland Kitchen

## 2011-05-16 NOTE — Patient Instructions (Signed)
Today we updated your med list in EPIC...    Continue your current meds the same...    Continue to monitor your BP at home...  Call for any problems... You will be due for a 65yr f/u colonoscopy this fall... Let's plan a 6 month recheck after that (in [id number]).Marland KitchenMarland Kitchen

## 2011-05-17 ENCOUNTER — Encounter: Payer: Self-pay | Admitting: Pulmonary Disease

## 2011-05-18 NOTE — Discharge Summary (Signed)
NAME:  [name] [name], [name] [name]                        ACCOUNT NO.:  [id number]   MEDICAL RECORD NO.:  [id number]                   PATIENT TYPE:  INP   LOCATION:  4731                                 FACILITY:  [facility]   PHYSICIAN:  Pricilla Riffle, M.D.                 DATE OF BIRTH:  Jun 27, 1941   DATE OF ADMISSION:  01/16/2004  DATE OF DISCHARGE:  01/18/2004                                 DISCHARGE SUMMARY   PROCEDURES:  1. Cardiac catheterization.  2. Coronary arteriogram.  3. Left ventriculogram.  4. Carotid and upper extremity Dopplers.   HOSPITAL COURSE:  [name] [name] is a 70 year old male with no known history of  coronary artery disease.  He is followed by Dr. Alroy Dust, who has been  treating him for dyslipidemia with a low HDL.  He has a family history of  premature coronary artery disease as well.  He was set up for a Cardiolite  because of his risk factors, and during the exercise portion, he developed  ST depression in the inferior and lateral leads that were downsloping and  continued late into recovery.  He had ischemia in the anterior and  anterolateral walls.  He was admitted for cath on January 15, 2004.   The cardiac catheterization was performed on January 17, 2004, and it showed  a 60% ostial LAD, first diagonal that was total, a ramus intermedius with an  80% stenosis, RCA with 70% mid stenosis, and 60% PDA.  The EF was greater  than 55%.  Dr. Chales Abrahams evaluated the films and felt that percutaneous  intervention and bypass surgery were both options, and a CVTS consult was  called.  [name] [name] was seen by Dr. Donata Clay, who felt that his coronary  artery disease could be treated most completely with bypass; however,  percutaneous intervention of the LAD and RCA would be another option if the  patient prefers this, as long as there are further risk factor  modifications.   Dr. Chales Abrahams evaluated [name] [name], and he is to ambulate postcath.  If he is  ambulatory without  significant shortness of breath, he is considered stable  for discharge on January 17, 2004 and is to follow as an outpatient in the  office and the CVTS.  Once the decision is made by he and his family on the  plan of care, either percutaneous intervention or bypass surgery will be  scheduled.   Status post carotid Dopplers with no significant RCA stenosis bilaterally  and antegrade vertebral artery flow.  Also, an Allen's test within normal  limits on the right, but the left Doppler decreases greater than 50% with  ulnar compression.   LABORATORY VALUES:  Hemoglobin 15.5, hematocrit 44.9, WBC 7.8, platelets  215.  Sodium 138, potassium 4.6, chloride 105, CO2 28, BUN 17, creatinine  1.0, glucose 101.  Total cholesterol 161, triglycerides 154, HDL 26, LDL  104.  CHEST X-RAY:  Mild bronchitic changes.  No acute disease.   DISCHARGE CONDITION:  Stable.   DISCHARGE DIAGNOSES:  1. Coronary artery disease, percutaneous intervention versus bypass surgery,     to be decided as an outpatient.  Follow up within two weeks with Dr.     Tenny Craw.  2. Dyslipidemia with mildly elevated triglycerides and significantly low     high-density lipoprotein.  Statin added.  3. Family history of premature coronary artery disease.  4. Abnormal Allen's test on the left upper extremity.  5. Mild heterogenous plaque noted in the left internal carotid artery with     no significant stenosis.  6. Hypertension.  7. History of asthmatic bronchitis.  8. Status post cyst removal in 1969.   DISCHARGE INSTRUCTIONS:  1. His activity level is to include no driving for two days.  No strenuous     activity until cleared by MD.  2. He is to stick to a diet that is low in fat and salt.  3. He is to call the office with problems with the cath site.  4. He is to follow up with Dr. Tenny Craw in the office.  Will call.  5. He is to follow up with Dr. Kriste Basque as scheduled.   DISCHARGE MEDICATIONS:  1. Aspirin 325 mg q.d.  2.  Plavix 75 mg q.d.  3. Nitroglycerin p.r.n.  4. Toprol XL 50 mg q.d.  5. Lipitor 40 mg q.d.      Theodore Demark, P.A. [facility]                  Pricilla Riffle, M.D.    RB/MEDQ  D:  01/17/2004  T:  01/18/2004  Job:  [id number]   cc:   Lonzo Cloud. Kriste Basque, M.D. [facility]

## 2011-05-18 NOTE — Consult Note (Signed)
NAME:  [name] [name], [name] [name]                        ACCOUNT NO.:  [id number]   MEDICAL RECORD NO.:  [id number]                   PATIENT TYPE:  INP   LOCATION:  4731                                 FACILITY:  [facility]   PHYSICIAN:  Mikey Bussing, M.D.           DATE OF BIRTH:  11/05/1941   DATE OF CONSULTATION:  01/17/2004  DATE OF DISCHARGE:                                   CONSULTATION   PHYSICIANS:  1. Requesting physician Veneda Melter, M.D.  2. Primary care physician Lonzo Cloud. Kriste Basque, M.D.  3. Primary cardiologist Pricilla Riffle, M.D.  4. Consultant Kerin Perna, M.D.   REASON FOR CONSULTATION:  Severe 3-vessel coronary artery disease with  positive stress test.   CHIEF COMPLAINT:  Chest pain and abnormal cardiac catheterization.   HISTORY OF PRESENT ILLNESS:  I was asked to evaluate this 70 year old white  male for potential surgical coronary revascularization  for recently  diagnosed 3-vessel coronary artery disease. The patient was evaluated at the  end of last week for coronary disease due to his strong positive family  history and positive risk factors. The patient denies previous  angina or  dyspnea on exertion. He does have an abnormal lipid profile with an HDL in  the 20 range with a positive family history of  coronary artery disease and  a sedentary lifestyle.   A Cardiolite stress test was performed which demonstrated ST segment in the  inferior and lateral leads. This was associated with substernal chest pain  which resolved with rest. The Cardiolite scan was markedly  abnormal for  ischemia in the anterior and anterolateral wall. His ejection fraction was  normal. Because of his strongly positive stress test, he was admitted and  scheduled for cardiac catheterization. This was performed today  by  Dr.  Chales Abrahams.   This demonstrated normal LV function. His left main had no significant  disease. His proximal LAD had a 60% to 70% stenosis. There was a diagonal  which  had a high-grade to complete occlusion lesion. The ramus intermediate  had an ostial 80% stenosis and the mid right had  a 75% stenosis with a  proximal lesion  in the posterior descending artery which was a dominant  vessel. I was asked to discuss coronary revascularization as a potential  treatment option for this patient. He is currently comfortable in his  hospital  room following cardiac catheterization.   PAST MEDICAL HISTORY:  1. Dyslipidemia.  2. History of asthmatic bronchitis.  3. Surgical history of positive for excision of pilonidal cyst.   ALLERGIES:  No known drug allergies.   SOCIAL HISTORY:  The patient is a retired IT sales professional. He does not smoke  or use alcohol. He is married and has1 child.   FAMILY HISTORY:  His father died of lung cancer at age  84. His mother is  alive at age 68 and had an MI. There is a  positive family history of  diabetes.   REVIEW OF SYSTEMS:  He was treated in the fall with a Z-pack and  decongestant for an episode of bronchitis. He has had no recent symptoms of  a URI. He denies any systemic problems with fever or weight loss. ENT review  is negative for difficulty  swallowing or change in vision. His  musculoskeletal review reveals no history of fractures or rib fractures.  Vascular review is negative for DVT, claudication or TIA. Hematologic review  is negative for  bleeding disorder or prior blood transfusion. Endocrine  review is negative for diabetes or thyroid disease. His GI review is  negative for blood per rectum, jaundice or hepatitis. There is no history of  prior MI, cardiac murmur or rheumatic heart disease.   PHYSICAL EXAMINATION:  GENERAL:  He is 6 feet, 2 inches and  weighs  220  pounds. General appearance is that of a pleasant, middle-aged white male in  his hospital  room following  cardiac catheterization, in no distress.  VITAL SIGNS:  Blood pressure 140/78, pulse 80, respirations 18, temperature  99.0.  HEENT:   Full extraocular movements. Pharynx clear. Dentition in good repair.  NECK:  Without JVD, carotid bruit or mass.  LYMPHATICS:  No palpable supraclavicular or axillary adenopathy.  THORAX:  Without deformity with clear breath sounds bilaterally.  CARDIAC:  Regular rhythm without S3, murmur, rub or gallop.  ABDOMEN:  Soft, nontender without masses.  EXTREMITIES:  No clubbing, cyanosis or edema.  VASCULAR:  2+ pulses in all extremities without venous insufficiency.  SKIN:  No rashes or lesion.  RECTAL:  Examination  is deferred.  NEUROLOGIC:  Alert and oriented x3 with full motor function. Again he is  restricted to  bedrest  at this time.   LABORATORY DATA:  I have reviewed the coronary  arteriograms of Dr. Chales Abrahams  and agree with his impression of severe 3-vessel coronary artery disease.  His LAD, diagonal, ramus intermediate and posterior descending would be  adequate targets for grafting.   PLAN:  I  have discussed  bypass surgery, the alternatives to surgery and  the expected recovery of the patient after bypass surgery with this patient.  He will consider his options, discuss percutaneous intervention again with  Dr. Chales Abrahams, and let us know of his final decision within the next 24 hours.   Thank you very much for this consultation. I would be happy to revisit the  patient regarding surgical revascularization for treatment of his coronary  artery disease.                                               Mikey Bussing, M.D.    PV/MEDQ  D:  01/17/2004  T:  01/17/2004  Job:  [id number]   cc:   [facility]   Lonzo Cloud. Kriste Basque, M.D. [facility]   CVTS Office

## 2011-05-18 NOTE — H&P (Signed)
NAME:  [name] [name], [name] [name] NO.:  [id number]   MEDICAL RECORD NO.:  [id number]                   PATIENT TYPE:  INP   LOCATION:  4731                                 FACILITY:  [facility]   PHYSICIAN:  Pricilla Riffle, M.D.                 DATE OF BIRTH:  1941/02/26   DATE OF ADMISSION:  01/16/2004  DATE OF DISCHARGE:                                HISTORY & PHYSICAL   IDENTIFICATION:  [name] [name] is a 70 year old gentleman who is routinely  followed by Dr. Alroy Dust.  He has no known coronary artery disease.  He  is referred for evaluation for abnormal Cardiolite.   HISTORY OF PRESENT ILLNESS:  Again, the patient has no known coronary artery  disease.  He has a dyslipidemia with a HDL in the 20 to 21 range.  He also  has a family history with mother who had CAD, MI at age 53.  The patient is  not too active.  Denies chest pain.  No significant shortness of breath, no  PND, really does not do much walking at all.  Because of his risk factors,  he was set up for a Cardiolite scan.  This was done today.  He exercised for  7 minutes 30 seconds to a peak heart rate of 133, which was 83% predicted  maximum.  Peak blood pressure was 186/70.  The patient developed chest  pressure during the study that eased off in recovery.  EKG baseline showed  normal sinus rhythm at a rate of 65 beats per minute.  During the exercise  he developed ST depression in the inferior and lateral leads that actually  were flat and became more downsloping 1 to 2 mm, continued on late into the  recovery period, were not resolved by 6-1/2 minutes of recovery.  Cardiolite  scan was markedly abnormal with evidence for ischemia in the anterior and  anterior lateral wall (base mid distal).  I do not have the calculated  ejection fraction at present.   ALLERGIES:  None.   MEDICATIONS:  None.   PAST MEDICAL HISTORY:  1. Dyslipidemia.  2. History of asthmatic bronchitis.   PAST SURGICAL HISTORY:   Cyst removal in 1969.   FAMILY HISTORY:  Father died of lung cancer at age 63.  Mother is alive at  age 35, MI at 60, has two brothers, one is a diabetic, one sister.   SOCIAL HISTORY:  The patient is married.  He is retired, has one child, does  not smoke, does not drink.   REVIEW OF SYSTEMS:  All other systems reviewed, negative to the above  problem except as noted.  Does note some dyspnea when he does exert himself.   PHYSICAL EXAMINATION:  GENERAL:  The patient is in no acute distress.  VITAL SIGNS:  Blood pressure 188/76 after the stress test, on arrival to the  stress test it was 138  to 150, pulse is 60s to 90s, weight not taken.  It  was 222 prior to Cardiolite.  NECK:  JVP is normal, no bruits.  LUNGS:  Clear.  CARDIAC:  Regular rate and rhythm, S1 and S2, no S3.  No S4.  No significant  murmurs.  ABDOMEN:  Benign, no bruits.  EXTREMITIES:  Good pulses throughout, no lower extremity edema, no bruits.   LABORATORY DATA:  A 12-lead EKG showed normal sinus rhythm at a rate of 65  beats per minute.   IMPRESSION:  [name] [name] is a 70 year old gentleman with no prior cardiac  history.  Risk factors for coronary artery disease.  Had a Cardiolite scan  today that is high risk study with a large area of ischemia in the anterior  anterior lateral wall.  I spoke with the patient quite frankly today, told  him he needed to have a cardiac catheterization and encouraged him to be  admitted today to have this done.  He declined understanding the risks,  wanted to review things with his wife.  I have talked to the patient since,  and he is now agreeable to proceed.   We will plan for admission electively on Sunday, unless he develops chest  pain prior, in which case he should come to the emergency room.  He is to  take Toprol 25 mg a day and an aspirin daily.  Follow up again with  admission on Sunday for cardiac catheterization scheduled for Monday  morning.  Again, if symptoms become  unstable, sooner.                                                Pricilla Riffle, M.D.    PVR/MEDQ  D:  01/14/2004  T:  01/14/2004  Job:  [id number]   cc:   Lonzo Cloud. Kriste Basque, M.D. [facility]

## 2011-05-18 NOTE — Discharge Summary (Signed)
NAME:  KYRE, JEFFRIES                        ACCOUNT NO.:  [id number]   MEDICAL RECORD NO.:  [id number]                   PATIENT TYPE:  INP   LOCATION:  2010                                 FACILITY:  [facility]   PHYSICIAN:  Kerin Perna, M.D.               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   PAST MEDICAL HISTORY:  1. Dyslipidemia.  2. History of asthmatic bronchitis.   PAST SURGICAL HISTORY:  Excision of pilonidal cyst.   ALLERGIES:  No known drug allergies.   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   BRIEF HISTORY:  Mr. Savant is a 70 year old Caucasian man.  Mid January  2005, he was evaluated for coronary disease due to his strong positive  family history and positive risk factors.  Cardiolite stress test was  performed.  The results were abnormal.  His test was also associated with  substernal chest pain which resolved with rest.  Dr. Chales Abrahams recommended  proceeding with cardiac catheterization.   He underwent cardiac catheterization January 17, 2004.  This revealed severe  three-vessel coronary disease including LAD with a 60 to 70% stenosis,  diagonal with high-grade complete occlusion.  Ramus intermediate had an  ostial 80% stenosis.  The mid right had 75% stenosis.  LV function was  normal.   Mr. Mauriello was referred to Dr. Donata Clay for consideration of surgical  revascularization.  After examination and review of available records, Dr.  Donata Clay felt that coronary artery bypass grafting would be preferred  treatment choice for this gentleman.  The procedure, risks, and benefits  were all discussed with Mr. Brodhead, and he deferred decision on surgery  until speaking again  with Dr. Chales Abrahams. Mr. Bays returned to [facility] office on  January 28 for further discussion on surgical revascularization with Dr. Donata Clay.  The procedure, risks, and benefits were again discussed with Mr.  Peerson, and he agreed to proceed with surgery.  He was scheduled as an  elective admission on February 3.   HOSPITAL COURSE:  On February 03, 2004, Mr. Zalesky was electively admitted to  [facility] under the care of Dr. Kathlee Nations Trigt.  He underwent  following surgical procedures.  Coronary artery bypass grafting x 4.  Grafts  placed at the time of the procedure: Left internal mammary artery graft to  left anterior descending artery.  The right radial artery was grafted to the  ramus artery.  Saphenous vein was grafted to the circumflex artery.  Saphenous vein was grafted to the distal right coronary artery.  Vein was  harvested from the right thigh via the endoscopic vein harvesting technique.  Intraoperative findings including poor targets which were small and heavily  disease.  His diagonal was not graftable.  Mr. Riendeau tolerated this  procedure well, transferring in stable condition to the SICU.  Her remained  hemodynamically stable in the immediate postoperative period.  He was  extubated several hours after arrival in intensive care unit.  He awoke from  anesthesia neurologically intact.  Mr. Usery intensive care unit stay was  uneventful; however, it was prolonged due to pulmonary and mobilization  issues.   On the morning of postoperative day #3, he was feeling much better, more  alert.  His overall condition had improved such that he could be transferred  out of the intensive care unit that morning.  He has continued making  progress and recovering from his surgery.  He has been volume overloaded  since surgery with some response to Lasix.  He had a slight bump in his BUN  and creatinine postoperative day #4, and his Lasix was held.  It was  restarted today.   His pulmonary function has improved considerably over the  last several days.  He is working with both the incentive spirometer and  flutter valve.   This morning, postoperative day #5, February 8, Mr. Chicoine reports feeling  very well.  His vital signs are stable.  Blood pressure 115/66.  He is  afebrile.  His room air saturation is 93%.  His heart is maintain normal  sinus rhythm at 97 beats per minute.  His lung fields are clear today.  His  bowel function is returning.  He is tolerating food without nausea.  He is  voiding without difficulty.  His incisions are all healing well.  His right  hand is neurovascularly intact.  He does have 1+ edema in bilateral lower  extremities.  He is ambulating frequently with minimal assistance.  His pain  control is adequate.  If Mr. Delaughter continues to progress in this manner, he  will be ready for discharge hom tomorrow, February 09, 2004.   LABORATORY DATA:  February 7 CBC: White blood cell 9.6, hemoglobin 10.6,  hematocrit 31.1, platelets 177.  Chemistries on February 8 include sodium  136, potassium 3.6, BUN 26, creatinine 1.0, glucose 113.  His potassium will  be supplemented today, and BMP checked in the morning.   CONDITION ON DISCHARGE:  Improved.   DISCHARGE MEDICATIONS:  1. Isosorbide mononitrate 15 mg daily for 3 weeks.  2. Aspirin 325 mg daily.  3. Combivent inhaler 2 puffs b.i.d.   He is to resume his home medications.  1. Toprol XL 50 mg daily.  2. Lipitor mg daily  3. For pain management, he may have Ultram 50 mg 1 to 2 p.o. q.6h. p.r.n.     for moderate to severe pain or Tylenol 325 mg 1 to 2 q.4h. p.r.n. mild     pain.   ACTIVITY:  He is asked to refrain from any driving or heavy lifting,  pushing, pulling.  He is also instructed to continue his breathing exercises  and daily walking.   DIET:  Diet should continue to be a heart healthy diet.   WOUND CARE:  He is to shower daily with mild soap and water.  If  his incisions show any sign of infection, or if he has a fever greater than 101  degrees F, he is to call Dr. Zenaida Niece Trigt's office.   FOLLOW UP:  1. He has an appointment to see the CVTS registered nurse for skin  staple     removal from his right arm Friday, February 18, at 9 a.m.  2. Dr. Tenny Craw would like to see him in her office in approximately two weeks.     An appointment will be made prior to discharge.  3. Dr. Donata Clay would like to see him in the office on Friday, March 4, at     11:30 a.m.      Toribio Harbour, N.P.                  Kerin Perna, M.D.    CTK/MEDQ  D:  02/08/2004  T:  02/08/2004  Job:  [id number]   cc:   Pricilla Riffle, M.D.   Lonzo Cloud. Kriste Basque, M.D. [facility]

## 2011-05-18 NOTE — Op Note (Signed)
NAME:  [name] [name], [name] [name]                        ACCOUNT NO.:  [id number]   MEDICAL RECORD NO.:  [id number]                   PATIENT TYPE:  INP   LOCATION:  2306                                 FACILITY:  [facility]   PHYSICIAN:  Kerin Perna III, M.D.           DATE OF BIRTH:  1941-09-23   DATE OF PROCEDURE:  02/03/2004  DATE OF DISCHARGE:                                 OPERATIVE REPORT   OPERATION PERFORMED:  Coronary artery bypass grafting times four (left  internal mammary artery to left anterior descending, right radial artery  graft to ramus intermediate, saphenous vein graft to right coronary artery,  saphenous vein graft to circumflex marginal).   PREOPERATIVE DIAGNOSIS:  Class 3 progressive angina with severe three-vessel  coronary artery disease.   POSTOPERATIVE DIAGNOSIS:  Class 3 progressive angina with severe three-  vessel coronary artery disease.   SURGEON:  Kerin Perna, M.D.   ASSISTANT:  Toribio Harbour, N.P.   ANESTHESIA:  General.   ANESTHESIOLOGIST:  Bedelia Person, M.D.   INDICATIONS FOR PROCEDURE:  The patient is a 70 year old white male with a  known history of severe coronary artery disease.  He had attempted  percutaneous therapy of his coronary disease by Dr. Chales Abrahams.  However, due to  his calcific disease, percutaneous therapy was not successful.  He was felt  to be a candidate for surgical revascularization due to his severe three-  vessel coronary disease and symptoms of class 3 angina.  Prior to surgery, I  examined the patient in the hospital and in the office and reviewed the  results of the cardiac catheterization with the patient and his wife.  I  discussed the indications and expected benefits of coronary artery bypass  surgery for treatment of his coronary artery disease.  I discussed the  alternatives to surgical therapy for treatment of his coronary artery  disease and the expected outcome of those alternative therapies.  I reviewed  with  the patient the major aspects of the proposed procedure including the  choice of conduit for grafting to include radial artery, mammary artery, and  saphenous vein, the location of the surgical incisions, the use of general  anesthesia, and cardiopulmonary bypass, and the expected postoperative  hospital recovery.  I discussed with the patient the risks to him of  coronary artery bypass surgery including the risks of MI, CVA, bleeding,  blood transfusion requirement, infection and death.  The patient understood  these implications for the surgery and agreed to proceed with the operation  as planned under what I felt was an informed consent.   OPERATIVE FINDINGS:  The patient's coronaries were severely and diffusely  diseased and were poor targets for grafting.  The LAD was deeply  intramyocardial under epicardial fat.  The diagonal was chronically  occluded, heavily calcified and nongraftable.  The ramus intermediate was  intramyocardial.  The circumflex marginal was a 1.0 mm vessel and graftable  but small.  The right coronary was very sclerotic and calcified.  The distal  vessels of the right coronary artery were intramyocardial and small and the  right graft was placed just proximal to the posterior descending  bifurcation.  The radial artery was a good conduit, the mammary artery was  small but with excellent flow.   DESCRIPTION OF PROCEDURE:  The patient was brought to the operating room and  placed supine on the operating table where general anesthesia was induced  under invasive monitoring.  The chest and legs were prepped with Betadine  and draped as a sterile field.  A right arm incision was made after the arm  had been prepped and draped as a separate sterile field.  The right radial  artery was harvested as a free graft and flushed with a heparin papaverine  solution.  Prior to harvesting the radial, confirmation of the patency of  the palmar arch was performed.  The arm  incision was closed in two layers  using Vicryl.  A sterile dressing was applied and the arm was then tucked to  the side and protected.   A sternal incision was then made as the saphenous vein was harvested from  the right thigh using endoscopic vein technique.  The left IMA was harvested  as a pedicle graft from its origin at the subclavian vessels.  The  pericardium was opened and suspended in a cradle.  Through pursestrings  placed in the ascending aorta and right atrium, the patient was cannulated  and placed on bypass.  The coronaries were identified for grafting.  The LAD  was difficult to find as it was deeply intramyocardial.  The right coronary  was calcified and thickened and a suboptimal target.  The distal posterior  descending and posterolateral vessels were very small and not adequate  targets.  The distal circumflex was small and a small target.  The mammary  artery, radial artery and vein grafts were prepared for the distal  anastomoses and a cardioplegia cannula was placed in the ascending aorta.  The patient was cooled to 30 degrees.  The aortic cross-clamp was applied.  800 mL of cold blood cardioplegia was delivered to the aortic root with good  cardioplegic arrest and septal temperature dropping less than 14 degrees.  Topical iced saline was used to augment myocardial preservation and a  pericardial insulator pad was used to protect the left phrenic nerve.  The  distal coronary anastomoses were then performed.  The first distal  anastomosis was to the distal right.  This was a sclerotic calcified vessel  with a proximal 80% stenosis.  A reversed saphenous vein was sewn end-to-  side with running 7-0 Prolene with good flow through the graft.  The second  distal anastomosis was to the circumflex marginal.  This was a 1.0 mm vessel  with proximal 70 to 80% stenosis.  A reversed saphenous vein was sewn end-to- side with running 7-0 Prolene with good flow through the  graft.  Cardioplegia was redosed.  The third distal anastomosis was to the ramus  intermediate.  This was a 1.5 mm vessel with proximal 95% stenosis.  The  left radial artery graft was sewn end-to-side with running 8-0 Prolene and  there was good flow through the graft. The fourth distal anastomosis was to  the distal third of the LAD where it became epicardial in its location.  It  was a small 1.5 mm vessel with approximately 80 to 90% stenosis.  The left  internal mammary artery pedicle was brought through an opening created in  the left lateral pericardium and was brought down onto the LAD and sewn end-  to-side with running 8-0 Prolene.  There was excellent flow through the  anastomosis with immediate rise in septal temperature after release of the  pedicle clamp on the mammary artery.  The mammary pedicle was secured to the  epicardium and the aortic cross-clamp was removed.   The cross-clamp was removed.  The heart resumed a spontaneous rhythm.  Using  a partial occlusion clamp three proximal anastomoses were placed on the  ascending aorta with the radial artery graft being the middle proximal  anastomosis.  A 4.0 mm punch and running 6-0 Prolene was used to construct  the proximal anastomoses.  The partial clamp was removed the grafts were  perfused.  Each had good flow and hemostasis was documented at the proximal  and distal sites.  The patient was rewarmed to 37 degrees.  Temporary pacing  wires were applied.  The lungs were re-expanded and the ventilator was  resumed.  The patient was then weaned from bypass without difficulty.  Cardiac output and blood pressure were stable.  Protamine was administered  without adverse reaction.  The cannulas were removed.  The patient remained  hemodynamically stable.  There was bleeding from the proximal right coronary  anastomosis which required brief reapplication of the partial occlusion  clamp to the ascending aorta and a redo anastomosis  with a running 6-0  Prolene.  The partial clamp was removed after the aorta was deaired.  The  proximal anastomosis was then checked and was hemostatic and there was good  flow through the graft.  The patient continued to be stable.  The  mediastinum was irrigated with warm antibiotic irrigation. The leg incision  was irrigated and closed in a standard fashion.  The superior pericardial  fat was closed.  Two mediastinal and a left  pleural chest tube were placed and brought out through separate incisions.  The sternum was closed with interrupted steel wire. The pectoralis fascia  was closed with a running #1 Vicryl.  The subcutaneous and skin layers were  closed with a running Vicryl.  Total bypass time was 160 minutes with cross-  clamp of 95 minutes.                                               Mikey Bussing, M.D.    PV/MEDQ  D:  02/03/2004  T:  02/04/2004  Job:  [id number]   cc:   Veneda Melter, M.D.

## 2011-05-18 NOTE — Cardiovascular Report (Signed)
NAME:  [name], [name]                        ACCOUNT NO.:  [id number]   MEDICAL RECORD NO.:  [id number]                   PATIENT TYPE:  INP   LOCATION:  4731                                 FACILITY:  [facility]   PHYSICIAN:  Veneda Melter, M.D.                   DATE OF BIRTH:  1941-07-06   DATE OF PROCEDURE:  01/17/2004  DATE OF DISCHARGE:                              CARDIAC CATHETERIZATION   PROCEDURES PERFORMED:  1. Left heart catheterization.  2. Left ventriculogram.  3. Selective coronary angiography.  4. Angiography of the left subclavian artery.   DIAGNOSES:  1. Three-vessel coronary artery disease.  2. Normal left ventricular systolic function.   HISTORY:  [name] is a 70 year old gentleman with a strong family history  of coronary disease, dyslipidemia who presents with mild dyspnea on  exertion.  The patient underwent stress imaging study showing ST segment  depression in inferior leads with ischemia of the anterior and anterior  lateral walls.  He presents for further cardiac assessment.   TECHNIQUE:  Informed consent was obtained.  The patient brought to the  catheterization lab.  A 6 French sheath was placed in the right femoral  artery using the modified Seldinger technique.  A 6 [place] JL-4 and JR-4  catheter was then used to engage the left and right coronary arteries and  selective angiography performed in various projections using manual  injection contrast.  The JL-4 catheter was then positioned in the left  subclavian artery and nonselective opacification of internal mammary artery  performed.  Subsequently, a 6 French pigtail catheter was advanced in the  left ventricle and a left ventriculogram performed using power injection  contrast.  After the termination of the case, the catheters and sheath were  removed and manual pressure applied until adequate hemostasis was achieved.  The patient tolerated the procedure well and was transferred to the floor in  stable condition.   FINDINGS:   LEFT HEART CATHETERIZATION:  1. Left main trunk:  Large caliber vessel calcified with a distal narrowing     of 30%.  2. LAD:  This is a medium-caliber vessel that provides diagonal branch in     the proximal segment.  The LAD had an ostial hazy narrowing of 60%.  The     first diagonal branch is occluded in its proximal segment.  The distal     section fills via collaterals and appears to bifurcate in its distal     section.  The diagonal branch appears to be diffusely diseased.  However,     supplies fair amount of the anterior lateral wall.  3. Left circumflex artery:  This is a medium caliber vessel that provides     two distal marginal branches.  The AV circumflex has mild disease of 30%.  4. Ramus intermedius.  This is a large caliber vessel that bifurcates in the     distal  segment.  There is a long tubular narrowing of 80% in the proximal     segment.  5. Right coronary artery is dominant.  It is a medium caliber vessel that     provides posterior descending artery and posterior ventricular branch in     the terminal segment.  The right coronary artery has moderate narrowing     of 50% in the proximal segment and there is a further tubular narrowing     of 70% in the mid section.  The posterior descending artery has moderate     narrowing of 60% in the proximal segment.  6. Left subclavian artery is patent.  The internal mammary artery is of     normal caliber and extends to the diaphragm.   LEFT VENTRICULOGRAPHY:  1. Normal end-systolic and end-diastolic dimensions.  2. Overall left ventricular function is well preserved.  3. Ejection fraction greater than 55%.  4. No mitral regurgitation.  5. LV pressure is 140/10.  6. Aortic pressure is 140/70.  7. LVEDP equals 20.   ASSESSMENT AND PLAN:  [name] is a 70 year old gentleman with complex  three-vessel coronary artery disease with overall well preserved LV  function.  He has severe  narrowings of the ramus intermedius and first  diagonal branch that supply the anterior lateral wall with borderline lesion  of the ostium of the LAD.  Treatment options will be discussed with the  patient including surgical versus percutaneous revascularization.  He will  unfortunately be at high risk for percutaneous intervention to the  intermediate branch because of extension of disease up to the ostium.  It is  unclear that the diagonal branch is salvageable.  Regardless, appears to  have diffuse disease.                                               Veneda Melter, M.D.    NG/MEDQ  D:  01/17/2004  T:  01/17/2004  Job:  [id number]   cc:   [name] [name]. [name] [name], M.D. [facility]

## 2011-05-18 NOTE — Cardiovascular Report (Signed)
NAME:  MASSIMILIANO, ROHLEDER                        ACCOUNT NO.:  [id number]   MEDICAL RECORD NO.:  [id number]                   PATIENT TYPE:  OIB   LOCATION:  2889                                 FACILITY:  [facility]   PHYSICIAN:  Veneda Melter, M.D.                   DATE OF BIRTH:  [birth year]   DATE OF PROCEDURE:  01/20/2004  DATE OF DISCHARGE:  01/20/2004                              CARDIAC CATHETERIZATION   PROCEDURES PERFORMED:  1. Intravascular cell stem of the left anterior descending.  2. Attempted percutaneous coronary intervention of the first diagonal branch     of the left anterior descending.   DIAGNOSIS:  Three-vessel coronary artery disease.   HISTORY:  Mr. Chauvin is a 70 year old gentleman with a known history of  multivessel coronary artery disease and abnormal Cardiolite scan with  significant ischemia in the anterolateral wall.  The patient underwent  cardiac catheterization on January 17, 2004, showing multivessel disease  with well-preserved LV function.  Surgical revascularization was  recommended.  However, the patient wished to consider percutaneous options.  He presents for percutaneous intervention of the diagonal branch.   TECHNIQUE:  Informed consent was obtained.  The patient was brought to the  catheterization laboratory.  A 7-French sheath was placed in the right  femoral artery using the modified Seldinger technique.  A 7-French JL4 guide  catheter was introduced.  However, significant damping of the left main wave  form was noted, and this was exchanged for a 6-French JL4 catheter.  Selective angiography was then performed.  This showed the left main trunk  to have distal calcific narrowing of 50%.  The LAD had a hazy narrowing of  60% at its origin.  The diagonal branch arose essentially from the left main  trunk and had an occlusion in the midsection.  The intermediate branch had a  high-grade narrowing of 80% of the proximal segment.  The AV circumflex  had  mild disease.  Again it was felt that surgical revascularization would be in  the patient's best interest.  However, to further assess the LAD stenosis we  elected to proceed with intravascular cell stem.  A 0.01 4-inch Whisper wire  was advanced to the distal LAD and attempt made to introduce the IVUS  catheter.  Heparin had been administered systemically to maintain ACT of  greater than 250 seconds.  This could not be passed, and the guide catheter  was exchanged for a 6-French CLS4 guide catheter.  At this point the IVUS  catheter could be passed distally and intravascular cell stem performed.  This showed the mid LAD to be a small-caliber vessel, approximately 2.75 mm.  The extent of atheroma was significant, with near occlusion of the vessel  proximally due to a slightly larger vessel.  However, there was soft plaque  throughout the length of the proximal mid LAD.  These findings were reviewed  with the  patient and again recommendations for surgical revascularization  made.  However, the patient did want for an attempt at PCI to the diagonal  branch.  The Whisper wire was then repositioned via a diagonal branch but  could not be advanced through the occlusion.  Using a 2 x 15-mm Voyager  balloon for backup, further attempts were made to traverse the occlusion,  and these proved unsuccessful.  A false channel was created without success  in cannulating the true lumen of the vessel.  Repeat angiography showed  persistence of collateral flow to the distal section of the diagonal branch  which was undamaged.  However, again the true lumen could not be identified.  The guide catheter was then removed at this point and the sheath secured  into position.  Findings were reviewed again with the patient.  At this  point he agreed to consider bypass surgery.  He was transferred to the floor  in stable condition.   FINAL RESULT:  1. Intravascular cell stem via left anterior descending  showing diffuse soft     atheroma and heavy calcification of the distal left main trunk.  2. Unsuccessful attempted percutaneous coronary intervention via diagonal     branch with 100% occlusion.   ASSESSMENT AND PLAN:  Mr. Cease is a 70 year old gentleman with advanced 3-  vessel coronary artery disease.  The diagonal branch appears to represent  chronic total occlusion.  It was a small-caliber vessel and could not be  recannulated due to extensive disease in the distal left main trunk, left  anterior descending, and intermediate arteries as well as the mid right  coronary artery.  The patient will be referred for coronary artery bypass  graft surgery.  He is now amenable to this approach.                                               Veneda Melter, M.D.    NG/MEDQ  D:  01/20/2004  T:  01/21/2004  Job:  [id number]   cc:   Lonzo Cloud. Kriste Basque, M.D. [facility]   Pricilla Riffle, M.D.

## 2011-05-31 ENCOUNTER — Other Ambulatory Visit: Payer: Self-pay | Admitting: Pulmonary Disease

## 2011-06-05 ENCOUNTER — Ambulatory Visit: Payer: Self-pay | Admitting: Cardiovascular Disease

## 2011-06-06 ENCOUNTER — Ambulatory Visit (INDEPENDENT_AMBULATORY_CARE_PROVIDER_SITE_OTHER): Payer: Medicare Other | Admitting: Cardiovascular Disease

## 2011-06-06 ENCOUNTER — Other Ambulatory Visit (INDEPENDENT_AMBULATORY_CARE_PROVIDER_SITE_OTHER): Payer: Medicare Other

## 2011-06-06 ENCOUNTER — Encounter: Payer: Self-pay | Admitting: Cardiovascular Disease

## 2011-06-06 DIAGNOSIS — I2510 Atherosclerotic heart disease of native coronary artery without angina pectoris: Secondary | ICD-10-CM

## 2011-06-06 DIAGNOSIS — I10 Essential (primary) hypertension: Secondary | ICD-10-CM

## 2011-06-06 DIAGNOSIS — I6529 Occlusion and stenosis of unspecified carotid artery: Secondary | ICD-10-CM

## 2011-06-06 DIAGNOSIS — I679 Cerebrovascular disease, unspecified: Secondary | ICD-10-CM

## 2011-06-06 DIAGNOSIS — E7800 Pure hypercholesterolemia, unspecified: Secondary | ICD-10-CM

## 2011-06-06 NOTE — Progress Notes (Signed)
[name] [name] seen today in followup. His known coronary disease with previous coronary bypass surgery in 2005. he has normal LV function. His risk factors will modify. Not having significant chest pain PND orthopnea is really active. He has 3 young grandchildren and is quite active with him. She is Dr. Kriste Basque for his primary care needs. He had a TIA last year with mild facial droop for about 3 minutes. W/U negative. On Plavix. F/U duplex with Dr. Arbie Cookey  This pm.  Unable to tolerate niaspan for low HDL.  Has new gym membership and will exercise more.  ROS: Denies fever, malais, weight loss, blurry vision, decreased visual acuity, cough, sputum, SOB, hemoptysis, pleuritic pain, palpitaitons, heartburn, abdominal pain, melena, lower extremity edema, claudication, or rash.  All other systems reviewed and negative  General: Affect appropriate Healthy:  appears stated age HEENT: normal Neck supple with no adenopathy JVP normal no bruits no thyromegaly Lungs clear with no wheezing and good diaphragmatic motion Heart:  S1/S2 no murmur,rub, gallop or click PMI normal Abdomen: benighn, BS positve, no tenderness, no AAA no bruit.  No HSM or HJR Distal pulses intact with no bruits No edema Neuro non-focal Skin warm and dry No muscular weakness   Current Outpatient Prescriptions  Medication Sig Dispense Refill  . aspirin 81 MG tablet Take 81 mg by mouth daily.        [name] atorvastatin (LIPITOR) 20 MG tablet Take 20 mg by mouth. 1/2 po daily       . Cholecalciferol (VITAMIN D) 2000 UNITS CAPS Take 1 capsule by mouth daily.        [name] PLAVIX 75 MG tablet TAKE 1 TABLET BY MOUTH DAILY  30 tablet  6  . sildenafil (VIAGRA) 100 MG tablet Take 1 tablet (100 mg total) by mouth daily. Use as directed  10 tablet  11  . valsartan (DIOVAN) 160 MG tablet Take 1 tablet (160 mg total) by mouth daily.  30 tablet  12  . DISCONTD: atorvastatin (LIPITOR) 20 MG tablet Take 1 tablet (20 mg total) by mouth daily.  30 tablet  11     Allergies  Niacin  Electrocardiogram:  Assessment and Plan

## 2011-06-06 NOTE — Assessment & Plan Note (Signed)
Cholesterol is at goal.  Continue current dose of statin and diet Rx.  No myalgias or side effects.  F/U  LFT's in 6 months. Lab Results  Component Value Date   LDLCALC 60 05/10/2011

## 2011-06-06 NOTE — Assessment & Plan Note (Signed)
Well controlled.  Continue current medications and low sodium Dash type diet.    

## 2011-06-06 NOTE — Assessment & Plan Note (Signed)
Stable with no angina and good activity level.  Continue medical Rx  

## 2011-06-06 NOTE — Patient Instructions (Signed)
Your physician wants you to follow-up in: ONE YEAR You will receive a reminder letter in the mail two months in advance. If you don't receive a letter, please call our office to schedule the follow-up appointment.  

## 2011-06-06 NOTE — Assessment & Plan Note (Signed)
ASA Duplex at [facility] today.  No bruit on exam

## 2011-06-14 NOTE — Procedures (Unsigned)
CAROTID DUPLEX EXAM  INDICATION:  Follow up carotid artery disease.  HISTORY: Diabetes:  No Cardiac:  CABG Hypertension:  Yes Smoking:  No Previous Surgery:  No CV History:  TIA symptoms 06/03/2009 Amaurosis Fugax No, Paresthesias  No, Hemiparesis  No                                      RIGHT             LEFT Brachial systolic pressure:         155               156 Brachial Doppler waveforms:         Normal            Normal Vertebral direction of flow:        Antegrade         Antegrade DUPLEX VELOCITIES (cm/sec) CCA peak systolic                   63                74 ECA peak systolic                   122               124 ICA peak systolic                   196               126 ICA end diastolic                   50                23 PLAQUE MORPHOLOGY:                  Mixed             Mixed PLAQUE AMOUNT:                      Mild              Mild PLAQUE LOCATION:                    ICA, ECA          ICA, ECA  IMPRESSION: 1. Right internal carotid artery velocity suggests 40% to 59%     stenosis. 2. Left internal carotid artery velocity suggests 1% to 39% stenosis. 3. Antegrade vertebral arteries bilaterally.  ___________________________________________ Larina Earthly, M.D.  EM/MEDQ  D:  06/07/2011  T:  06/07/2011  Job:  [id number]

## 2011-11-14 ENCOUNTER — Ambulatory Visit: Payer: Medicare Other | Admitting: Pulmonary Disease

## 2011-11-16 ENCOUNTER — Ambulatory Visit: Payer: Medicare Other | Admitting: Pulmonary Disease

## 2012-01-03 ENCOUNTER — Encounter: Payer: Self-pay | Admitting: Pulmonary Disease

## 2012-01-03 ENCOUNTER — Ambulatory Visit (INDEPENDENT_AMBULATORY_CARE_PROVIDER_SITE_OTHER): Payer: Medicare Other | Admitting: Pulmonary Disease

## 2012-01-03 DIAGNOSIS — E663 Overweight: Secondary | ICD-10-CM

## 2012-01-03 DIAGNOSIS — I10 Essential (primary) hypertension: Secondary | ICD-10-CM

## 2012-01-03 DIAGNOSIS — G459 Transient cerebral ischemic attack, unspecified: Secondary | ICD-10-CM

## 2012-01-03 DIAGNOSIS — I2510 Atherosclerotic heart disease of native coronary artery without angina pectoris: Secondary | ICD-10-CM

## 2012-01-03 DIAGNOSIS — I679 Cerebrovascular disease, unspecified: Secondary | ICD-10-CM

## 2012-01-03 DIAGNOSIS — E7800 Pure hypercholesterolemia, unspecified: Secondary | ICD-10-CM

## 2012-01-03 DIAGNOSIS — D126 Benign neoplasm of colon, unspecified: Secondary | ICD-10-CM

## 2012-01-03 MED ORDER — AMLODIPINE BESYLATE 5 MG PO TABS: 5.0000 mg | ORAL_TABLET | Freq: Every day | ORAL | Status: DC

## 2012-01-03 NOTE — Patient Instructions (Signed)
Today we updated your med list in our EPIC system...    Continue your current medications the same...    We decided to add AMLODIPINE 5mg  daily for better BP control...    Continue to monitor your BP at home & call for any questions...  Let's plan a follow up visit in 6 months w/ CXR, EKG, and FASTING blood work around that time.Marland KitchenMarland Kitchen

## 2012-01-06 ENCOUNTER — Encounter: Payer: Self-pay | Admitting: Pulmonary Disease

## 2012-01-06 NOTE — Progress Notes (Signed)
Subjective:    Patient ID: [name] [name], male    DOB: 07/29/1941, 71 y.o.   MRN: [id number]  HPI 71 y/o WM here for a follow up visit... he has multiple medical problems as noted below...    ~  Jun10:  Hosp w/ TIA w/ left face & arm symptoms that resolved quickly... CT showed small vessel disease;  MRI showed bilat remote lacunar infarcts, no acute infarct, +sm vessel dis;  MRA showed intracranial atherosclerotic changes, & ?focal stenosis (?70%) of left ICA in the neck... he saw [name] 06/08/09 (no bruits) w/ CDopplers done showing bilat 40-59% carotid stenoses & agreed w/ ASA + Plavix and f/u 51yr...  he states that he stopped the Plavix after just several days due to swelling in his feet that resolved off the Plavix Rx- we discussed this and I strongly rec that he restart the Plavix due to his recent TIA...  ~  May 16, 2010:  still not dieting or exercising & wt stable ~233#> we discussed needed diet + exercise program for weight reduction... BP controlled on the Diovan;  denies angina etc & due for f/u DrNishan soon;  he continues on the ASA/ Plavix w/o TIAs etc;  FLP looks good on Lip10 x low HDL & rec for incr exercise...   ~  November 14, 2010:  he's had a good 36mo- no new complaints or concerns... he saw [name] 6/11 for yearly f/u doing satis & no changes made... he mentioned that [place] wanted to decr Lipitor10 to 5mg /d since his LDL was in the 60's & add Niaspan to help raise his HDL but he refuses Niacin preps due to HA & flushing, and he prefers to continue Lip10 daily... BP sl elev today but he states all 130s/ 70s at home;  weight still in the 235# range & we reviewed diet + exercise perscription for him;  there have been no interval cerebral ischemic events on ASA & Plavix...   ~  May 16, 2011:  36mo ROV & he's been stable overall but noted BP up yest w/ stress (wife had hand surg)- refuses anxiolytic med, states BP ret to norm on it's own (he also takes Doivan160);  Stable on ASA/  Plavix w/o cerebral ischemic symptoms & he is due f/u w/ [name] soon ;  FLP looks good on Lip10 daily & BS/ A1c are normal on diet alone, but wt is only down 6# to 229# today... Requests refills for 30d supplies.  ~  January 03, 2012:  7-37mo ROV     [name] on Diovan160 & intol to BBlockers in past; BP= 152/68 but he says better at home; we discussed the need for tighter control & rec adding NORVASC 5mg /d...    CAD> s/p 4 vessel CABG 2005, on ASA/ Plavix; he saw [name] 6/12 for yearly check up- stable & no changes made...    Cerebrovasc Dis> on ASA/ Plavix; followed by [name] but the last note & CDoppler we have is 6/10==> we will contact his office for f/u notes & Duplex reports...    CHOL> on Lip20- taking 1/2 tab daily; last FLP 5/12 looked good x low HDL=28; he is intol Niacin & rec to incr exercise program which he has yet to do...    Overweight> weight= 234# which is up 5# despite mult attempts at diet, exercise, wt loss program...    Hx colon polyps> last colon 10/07 by [name] & follow up due 10/12; we will send referral to his office.Marland KitchenMarland Kitchen  DJD/ Vit D defic> he uses OTC analgesics as needed; continues on Vit D 2000u daily...    Hx TIA> SEE 6/10 Hosp> sm vessel dis, remote lacunar infarcts, intracranial atherosclerotic changes on MRA; CDopplers followed by [name] w/ 40-59% bilat ICA stenoses but we don't have recent notes & we will contact his office for the data...          Problem List:    HYPERTENSION (ICD-401.9) - controlled on DIOVAN 160mg /d... BP's ave 130's/70's at home he says... ~  Jun09:  prev on Atenolol25mg - stopped due to side effects & feels much better off BBlockers. ~  2/10:  Avapro300 changed to Diovan160 per his insurance company for $$$ reasons. ~  11/11:  BP= 150/80 & remains asymptomatic, doesn't want to incr meds- discussed diet/ exerc... ~  5/12:  BP= 132/70 & denies HA, fatigue, visual changes, CP, palipit, dizziness, syncope, dyspnea, edema, etc... ~  1/13:   BP= 152/68 & he remains asymptomatic; we decided to add NORVASC 5mg /d...  CORONARY ARTERY DISEASE (ICD-414.00) - S/P 4 vessel CABG 2/05 by DrVanTrigt> on ASA/ PLAVIX & followed by [name] yearly & his notes are reviewed; he is intol to BBlocker therapy as noted... ~  5/12 & 1/13:  stable & denies angina, palpit, SOB, edema, etc...  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 followed by Lyla Son for [facility]. ~  6/10:  See 6/10 Hosp records & f/u by [name] in his office... ~  6/11:  seen by [name] & stable w/o TIAs or amaurosis; CDopplers were stable w/ bilat 40-59% ICA stenoses, f/u 3yr. ~  He was due for a follow up eval 6/12> we don't have records & will call for the report...  VENOUS INSUFFICIENCY (ICD-459.81) - he is aware of need to elim salt, elevate legs, wear support hose.  HYPERCHOLESTEROLEMIA (ICD-272.0) - controlled on LIPITOR 20- 1/2 tab daily... he has tried NIACIN in the past and refuses to take it again due to headaches. ~  FLP 5/08 showed TChol 125, TG 171, HDL 24, LDL 67...  ~  FLP 12/08 showed TChol 107, TG 131, HDL 23, LDL 58... rec- same med, better diet & exercise. ~  FLP 2/10 on Lip10 (wt=235#) showed TChol 119, TG 158, HDL 24, LDL 63... rec- ditto ~  FLP 11/10 on Lip10 (wt=236#) showed TChol 115, TG 128, HDL 28, LDL 62 ~  FLP 5/11 on Lip10 (wt=233#) showed TChol 119, TG 144, HDL 27, LDL 63 ~  DrNishan suggested that he cut the Lipitor to 5mg /d & add Niaspan, but he refuses the Niacin preps due to HAs & wants to contin Lip10. ~  FLP 11/11 on Lip10 (wt=235#) showed TChol 119, TG 113, HDL 31, LDL 66 ~  FLP 5/12 on Lip10 (wt=229#) showed TChol 118, TG 150, HDL 28, LDL 60  DIABETES MELLITUS, BORDERLINE (ICD-790.29) - FBS in the 110-125 range... On diet Rx alone... ~  labs in hosp 6/10 showed BS= 109-113 ~  labs 11/10 showed BS= 109, A1c= 5.9 ~  labs 5/11 showed BS= 107 ~  labs 11/11 showed BS= 108 ~  Labs 5/12 showed BS= 97, A1c=  6.0  OVERWEIGHT (ICD-278.02) - he is 6'2" tall and weighs ~230# for a BMI of 30... we have discussed diet & exercise stategies.  COLONIC POLYPS (ICD-211.3) - last colonoscopy 10/07 by [name] showed several 5-6 mm polyps... path= tubular adenomas ... f/u planned 5 years.  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 Rx w/ PT.  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 ~  labs 2/10 showed Vit D level = 14... rec to start 2000 u daily.  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  VITAMIN D DEFIC >> on Vit D supplement 2000u daily... ~  Labs 2/10 showed Vit D level = 14... rec to start Vit D supplement OTC...  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 ~  6/10:  Hosp w/ TIA w/ left face & arm symptoms that resolved quickly; CT showed small vessel disease;  MRI showed bilat remote lacunar infarcts, no acute infarct, +sm vessel dis;  MRA showed intracranial atherosclerotic changes, & ?focal stenosis (?70%) of left ICA in the neck; he saw [name] 06/08/09 (no bruits) w/ CDopplers done showing bilat 40-59% carotid stenoses & agreed w/ ASA + Plavix and f/u 3yr...   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 he had plastic surg by [facility] 5/09 w/ improved scar.  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 ~  GU:  he is up to date on DRE & PSA checks here (PSA remains in the 1-2 range). ~  Immunizations:  he had PNEUMOVAX & TETANUS in 2000 w/ repeat PNEUMOVAX 11/10 & TDAP 5/11... he gets the yearly Seasonal Flu vaccine each fall...   Past Surgical History  Procedure Date  . Pilonidial cystectomy 1989  . 4 vessel cabg 02/2004    Dr. Maren Beach  . Keloid  surgery on chest scar 04/2008    Dr. Stephens November    Outpatient Encounter Prescriptions as of 01/03/2012  Medication Sig Dispense Refill  . aspirin 81 MG tablet Take 81 mg by mouth daily.        [name] atorvastatin (LIPITOR) 20 MG tablet Take as directed      . Cholecalciferol (VITAMIN D) 2000 UNITS CAPS Take 1 capsule by mouth daily.        [name] PLAVIX 75 MG tablet TAKE 1 TABLET BY MOUTH DAILY  30 tablet  6  . sildenafil (VIAGRA) 100 MG tablet Use as directed       . valsartan (DIOVAN) 160 MG tablet Take 1 tablet (160 mg total) by mouth daily.  30 tablet  12  . DISCONTD: sildenafil (VIAGRA) 100 MG tablet Take 1 tablet (100 mg total) by mouth daily. Use as directed  10 tablet  11  . amLODipine (NORVASC) 5 MG tablet Take 1 tablet (5 mg total) by mouth daily.  30 tablet  6    Allergies  Allergen Reactions  . Niacin     REACTION: intol to NIACIN w/ headaches    Current Medications, Allergies, Past Medical History, Past Surgical History, Family History, and Social History were reviewed in [employer] record.    Review of Systems         See HPI - all other systems neg except as noted... The patient denies anorexia, fever, weight loss, weight gain, vision loss, decreased hearing, hoarseness, chest pain, syncope, dyspnea on exertion, peripheral edema, prolonged cough, headaches, hemoptysis, abdominal pain, melena, hematochezia, severe indigestion/heartburn, hematuria, incontinence, muscle weakness, suspicious skin lesions, transient blindness, difficulty walking, depression, unusual weight change, abnormal bleeding, enlarged lymph nodes, and angioedema.  Objective:   Physical Exam    WD, Overweight, 71 y/o WM in NAD... Vital Signs:  Reviewed> GENERAL:  Alert & oriented; pleasant & cooperative. HEENT:  [place]/AT, EOM-wnl, PERRLA, Fundi-benign, EACs-clear, TMs-wnl, NOSE-clear, THROAT-clear & wnl. NECK:  Supple w/ fairROM; no JVD; normal carotid impulses w/o bruits; no  thyromegaly or nodules palpated; no lymphadenopathy. CHEST:  Clear to P & A; without wheezes/ rales/ or rhonchi. HEART:  Median sternotomy scar w/ improved keloid, regular rhythm; without murmurs/ rubs/ or gallops. ABDOMEN:  Soft & nontender; normal bowel sounds; no organomegaly or masses detected. 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 EXT: without deformities, mild arthritic changes; + venous insuffic changes, no edema. NEURO:  CN's intact; motor testing normal; sensory testing normal; gait normal & balance OK. DERM:  Keloid as noted, no other lesions...  RADIOLOGY DATA:  Reviewed in the EPIC EMR & discussed w/ the patient...  LABORATORY DATA:  Reviewed in the EPIC EMR & discussed w/ the patient...   Assessment & Plan:   [name]  On Diovan160 but not well enough controlled & rec adding NORVASC 5mg /d...  CEREBROVASC DIS/ TIA>  No further cerebral ischemic symptoms on the ASA/ Plavix; we will call for [name]'s 6/12 reports...  CHOL>  On Lip10 & stable; needs better low fat diet & get wt down!  DM> adeq control w/ diet alone...  GI>  He is overdue for colonoscopy w/ [name] & we will refer...  Other medical problems as noted...   Patient's Medications  New Prescriptions   AMLODIPINE (NORVASC) 5 MG TABLET    Take 1 tablet (5 mg total) by mouth daily.  Previous Medications   ASPIRIN 81 MG TABLET    Take 81 mg by mouth daily.     ATORVASTATIN (LIPITOR) 20 MG TABLET    Take as directed   CHOLECALCIFEROL (VITAMIN D) 2000 UNITS CAPS    Take 1 capsule by mouth daily.     PLAVIX 75 MG TABLET    TAKE 1 TABLET BY MOUTH DAILY   VALSARTAN (DIOVAN) 160 MG TABLET    Take 1 tablet (160 mg total) by mouth daily.  Modified Medications   Modified Medication Previous Medication   SILDENAFIL (VIAGRA) 100 MG TABLET sildenafil (VIAGRA) 100 MG tablet      Use as directed     Take 1 tablet (100 mg total) by mouth daily. Use as directed  Discontinued Medications   No medications on file

## 2012-01-07 ENCOUNTER — Encounter: Payer: Self-pay | Admitting: *Deleted

## 2012-01-08 ENCOUNTER — Encounter: Payer: Self-pay | Admitting: *Deleted

## 2012-01-08 ENCOUNTER — Ambulatory Visit (INDEPENDENT_AMBULATORY_CARE_PROVIDER_SITE_OTHER): Payer: Medicare Other | Admitting: Gastroenterology

## 2012-01-08 ENCOUNTER — Encounter: Payer: Self-pay | Admitting: Gastroenterology

## 2012-01-08 VITALS — BP 150/62 | HR 72 | Ht 74.0 in | Wt 231.8 lb

## 2012-01-08 DIAGNOSIS — Z860100 Personal history of colon polyps, unspecified: Secondary | ICD-10-CM | POA: Insufficient documentation

## 2012-01-08 DIAGNOSIS — Z7901 Long term (current) use of anticoagulants: Secondary | ICD-10-CM

## 2012-01-08 MED ORDER — PEG-KCL-NACL-NASULF-NA ASC-C 100 G PO SOLR: 1.0000 | Freq: Once | ORAL | Status: DC

## 2012-01-08 NOTE — Progress Notes (Signed)
History of Present Illness:  This is a 71 year old Caucasian male with a long history of recurrent colon polyps, due for followup exam at this time. He denies any gastrointestinal problems. He is on long-term aspirin and Plavix per small vessel disease with a history of multiple infarcts and a brief TIA. He also isstatus post coronary artery bypass surgery. He denies any current cardiovascular or pulmonary complaints. Labs were reviewed and all appear normal. He specifically denies abdominal pain, melena, hematochezia, or acid reflux symptoms.  I have reviewed this patient's present history, medical and surgical past history, allergies and medications.     ROS: The remainder of the 10 point ROS is negative     Physical Exam: General well developed well nourished patient in no acute distress, appearing his stated age Eyes PERRLA, no icterus, fundoscopic exam per opthamologist Skin no lesions noted Neck supple, no adenopathy, no thyroid enlargement, no tenderness Chest clear to percussion and auscultation Heart no significant murmurs, gallops or rubs noted Abdomen no hepatosplenomegaly masses or tenderness, BS normal.  Extremities no acute joint lesions, edema, phlebitis or evidence of cellulitis. Neurologic patient oriented x 3, cranial nerves intact, no focal neurologic deficits noted. Psychological mental status normal and normal affect.  Assessment and plan: We have scheduled the patient for followup colonoscopy at his convenience and will hold Plavix 5 days before this procedure while continuing aspirin. He has had polyps going back over 20 years, and appears to be in good medical condition at this time and should tolerate colonoscopy preparation with a balanced electrolyte solution without difficulty. He is doing well from a cardiovascular standpoint, he is to continue all other medications as listed. I spent a large amount of time with this patient going over his previous exams, his  medications, and colonoscopy in general. He agrees with these plans and we will proceed accordingly.  Encounter Diagnoses  Name Primary?  . Personal history of colonic polyps Yes  . Anticoagulant long-term use

## 2012-01-08 NOTE — Patient Instructions (Addendum)
You have been given a separate informational sheet regarding your tobacco use, the importance of quitting and local resources to help you quit. Your procedure has been scheduled for 02/11/2012, please follow the seperate instructions.  Your prescription(s) have been sent to you pharmacy.

## 2012-01-09 ENCOUNTER — Encounter: Payer: Self-pay | Admitting: Gastroenterology

## 2012-01-24 ENCOUNTER — Telehealth: Payer: Self-pay | Admitting: Pulmonary Disease

## 2012-01-24 ENCOUNTER — Telehealth: Payer: Self-pay | Admitting: *Deleted

## 2012-01-24 NOTE — Telephone Encounter (Signed)
I spoke with [name] [name] and she states they are needing the okay from SN to hold pt's plavix until after his colonoscopy on 2/11. Please advise Dr. Kriste Basque, thanks

## 2012-01-24 NOTE — Telephone Encounter (Signed)
Per Dr Kriste Basque pt can hold Plavix for 5 days left message for pt to call back, see previous note from PULM.

## 2012-01-24 NOTE — Telephone Encounter (Signed)
Per SN---ok for pt to hold the plavix 5 days prior to colon.  Called and spoke with [name] [name] and she is aware.

## 2012-01-25 NOTE — Telephone Encounter (Signed)
Pt aware he can hold plavix starting 02/06/2012

## 2012-02-11 ENCOUNTER — Encounter: Payer: Self-pay | Admitting: Gastroenterology

## 2012-02-11 ENCOUNTER — Ambulatory Visit (AMBULATORY_SURGERY_CENTER): Payer: Medicare Other | Admitting: Gastroenterology

## 2012-02-11 DIAGNOSIS — Z1211 Encounter for screening for malignant neoplasm of colon: Secondary | ICD-10-CM

## 2012-02-11 DIAGNOSIS — Z860100 Personal history of colon polyps, unspecified: Secondary | ICD-10-CM

## 2012-02-11 MED ORDER — SODIUM CHLORIDE 0.9 % IV SOLN: 500.0000 mL | INTRAVENOUS | Status: DC

## 2012-02-11 NOTE — Patient Instructions (Addendum)
Please follow discharge instructions given today. Normal exam today,repeat colonoscopy in 10 years. Resume current medications today. Call us with any questions or concerns. Thank you!!

## 2012-02-11 NOTE — Progress Notes (Signed)
Patient did not experience any of the following events: a burn prior to discharge; a fall within the facility; wrong site/side/patient/procedure/implant event; or a hospital transfer or hospital admission upon discharge from the facility. (G8907) Patient did not have preoperative order for IV antibiotic SSI prophylaxis. (G8918)  

## 2012-02-11 NOTE — Op Note (Signed)
[facility] [address] [place], [place]  [address]  COLONOSCOPY PROCEDURE REPORT  PATIENT:  [name] [name], [name] [name]  MR#:  [id number] BIRTHDATE:  11/29/1941, 70 yrs. old  GENDER:  male ENDOSCOPIST:  Vania Rea. Jarold Motto, MD, [facility] REF. BY: PROCEDURE DATE:  02/11/2012 PROCEDURE:  Average-risk screening colonoscopy G0121 ASA CLASS:  Class III INDICATIONS:  history of pre-cancerous (adenomatous) colon polyps  MEDICATIONS:   propofol (Diprivan) 100 mg IV  DESCRIPTION OF PROCEDURE:   After the risks and benefits and of the procedure were explained, informed consent was obtained. Digital rectal exam was performed and revealed no abnormalities. The LB 180AL [id number] endoscope was introduced through the anus and advanced to the cecum, which was identified by both the appendix and ileocecal valve.  The quality of the prep was excellent, using MoviPrep.  The instrument was then slowly withdrawn as the colon was fully examined. <<PROCEDUREIMAGES>>  FINDINGS:  No polyps or cancers were seen.  This was otherwise a normal examination of the colon.   Retroflexed views in the rectum revealed no abnormalities.    The scope was then withdrawn from the patient and the procedure completed.  COMPLICATIONS:  None ENDOSCOPIC IMPRESSION: 1) No polyps or cancers 2) Otherwise normal examination RECOMMENDATIONS: 1) Continue current colorectal screening recommendations for "routine risk" patients with a repeat colonoscopy in 10 years. resume all meds  REPEAT EXAM:  No  ______________________________ Vania Rea. Jarold Motto, MD, [facility]  CC:  n. eSIGNED:   Vania Rea. Deveron Shamoon at 02/11/2012 09:01 AM  Milana Na, [phone]

## 2012-02-12 ENCOUNTER — Telehealth: Payer: Self-pay | Admitting: *Deleted

## 2012-02-12 NOTE — Telephone Encounter (Signed)
  Follow up Call-  Call back number 02/11/2012  Post procedure Call Back phone  # [phone]  Permission to leave phone message Yes     Patient questions:  Do you have a fever, pain , or abdominal swelling? no Pain Score  0 *  Have you tolerated food without any problems? yes  Have you been able to return to your normal activities? yes  Do you have any questions about your discharge instructions: Diet   no Medications  no Follow up visit  no  Do you have questions or concerns about your Care? no  Actions: * If pain score is 4 or above: No action needed, pain <4.

## 2012-04-23 ENCOUNTER — Telehealth: Payer: Self-pay | Admitting: Pulmonary Disease

## 2012-04-23 NOTE — Telephone Encounter (Signed)
Per SN--the norvasc can  Cause edema but not cough.  Options are  1.  Stop the norvasc and monitor BP at home on diovan therapy 2.  Add a diuretic to compensate for the swelling  eg dyazide daily.  SN suggests to the pt to stop the norvasc and watch BP at home and stay on the diovan and diet with no salt.  Keep appt with Dr. Lezlie Octave in June.  Pt voiced his understanding of these results.

## 2012-04-23 NOTE — Telephone Encounter (Signed)
Pt states he started taking Norvasc 5mg  QD couple months ago and noticed a slight cough that developed; also having edema in his feet. Pt said he did not take the BP med this morning and has noticed his cough is not as bad. SN, please advise. Thanks.

## 2012-05-06 ENCOUNTER — Other Ambulatory Visit: Payer: Self-pay | Admitting: Cardiovascular Disease

## 2012-06-06 ENCOUNTER — Other Ambulatory Visit: Payer: Self-pay | Admitting: Pulmonary Disease

## 2012-06-10 ENCOUNTER — Encounter: Payer: Self-pay | Admitting: Cardiovascular Disease

## 2012-06-10 ENCOUNTER — Ambulatory Visit (INDEPENDENT_AMBULATORY_CARE_PROVIDER_SITE_OTHER): Payer: Medicare Other | Admitting: Cardiovascular Disease

## 2012-06-10 VITALS — BP 154/80 | HR 71 | Ht 73.0 in | Wt 232.0 lb

## 2012-06-10 DIAGNOSIS — I2510 Atherosclerotic heart disease of native coronary artery without angina pectoris: Secondary | ICD-10-CM

## 2012-06-10 DIAGNOSIS — I10 Essential (primary) hypertension: Secondary | ICD-10-CM

## 2012-06-10 MED ORDER — HYDROCHLOROTHIAZIDE 12.5 MG PO CAPS: 12.5000 mg | ORAL_CAPSULE | Freq: Every day | ORAL | Status: DC

## 2012-06-10 NOTE — Assessment & Plan Note (Signed)
Support hose and add low dose diuretic

## 2012-06-10 NOTE — Patient Instructions (Signed)
Your physician has recommended you make the following change in your medication:  Start HCTZ 12.5mg  1 tablet daily  Your physician recommends that you return for lab work in: 8 weeks for a BMP  Your physician wants you to follow-up in: 6 months with Dr. Eden Emms. You will receive a reminder letter in the mail two months in advance. If you don't receive a letter, please call our office to schedule the follow-up appointment.

## 2012-06-10 NOTE — Progress Notes (Signed)
Patient ID: [name] [name], male   DOB: November 03, 1941, 71 y.o.   MRN: [id number] [name] [name] seen today in followup. His known coronary disease with previous coronary bypass surgery in 2005. he has normal LV function. His risk factors will modify. Not having significant chest pain PND orthopnea is really active. He has 3 young grandchildren and is quite active with him. She is Dr. Kriste [name] for his primary care needs. He had a TIA last year with mild facial droop for about 3 minutes. W/U negative. On Plavix.  BP a little high.  Norvasc caused swelling of feet and stopped  ROS: Denies fever, malais, weight loss, blurry vision, decreased visual acuity, cough, sputum, SOB, hemoptysis, pleuritic pain, palpitaitons, heartburn, abdominal pain, melena, lower extremity edema, claudication, or rash.  All other systems reviewed and negative  General: Affect appropriate Healthy:  appears stated age HEENT: normal Neck supple with no adenopathy JVP normal no bruits no thyromegaly Lungs clear with no wheezing and good diaphragmatic motion Heart:  S1/S2 no murmur, no rub, gallop or click PMI normal Abdomen: benighn, BS positve, no tenderness, no AAA no bruit.  No HSM or HJR Distal pulses intact with no bruits Plus one bilateral  edema Neuro non-focal Skin warm and dry No muscular weakness   Current Outpatient Prescriptions  Medication Sig Dispense Refill  . aspirin 81 MG tablet Take 81 mg by mouth daily.        [name] atorvastatin (LIPITOR) 20 MG tablet Take 10 mg by mouth daily. Take as directed      . Cholecalciferol (VITAMIN D) 2000 UNITS CAPS Take 1 capsule by mouth daily.        . clopidogrel (PLAVIX) 75 MG tablet TAKE 1 TABLET BY MOUTH EVERY DAY  30 tablet  11  . DIOVAN 160 MG tablet TAKE 1 TABLET BY MOUTH EVERY DAY  30 tablet  6  . sildenafil (VIAGRA) 100 MG tablet Use as directed       . DISCONTD: valsartan (DIOVAN) 160 MG tablet Take 1 tablet (160 mg total) by mouth daily.  30 tablet  12     Allergies  Niacin  Electrocardiogram: NSR rate 71 normal ECG  Assessment and Plan

## 2012-06-10 NOTE — Assessment & Plan Note (Signed)
Stable with no angina and good activity level.  Continue medical Rx  

## 2012-06-10 NOTE — Assessment & Plan Note (Signed)
Low HDL  Accelerate Trial  Continue statin

## 2012-06-10 NOTE — Assessment & Plan Note (Signed)
Add low dose HCTZ  Check BMET 8 weeks

## 2012-06-11 NOTE — Addendum Note (Signed)
Addended by: Reine Just on: 06/11/2012 03:29 PM   Modules accepted: Orders

## 2012-06-13 ENCOUNTER — Ambulatory Visit: Payer: Medicare Other | Admitting: Neurosurgery

## 2012-06-13 ENCOUNTER — Other Ambulatory Visit (INDEPENDENT_AMBULATORY_CARE_PROVIDER_SITE_OTHER): Payer: Medicare Other

## 2012-06-13 DIAGNOSIS — I6529 Occlusion and stenosis of unspecified carotid artery: Secondary | ICD-10-CM

## 2012-06-16 ENCOUNTER — Other Ambulatory Visit: Payer: Self-pay

## 2012-06-16 DIAGNOSIS — I6529 Occlusion and stenosis of unspecified carotid artery: Secondary | ICD-10-CM

## 2012-06-19 NOTE — Procedures (Unsigned)
CAROTID DUPLEX EXAM  INDICATION:  Carotid artery stenosis  HISTORY: Diabetes:  No Cardiac:  CABG Hypertension:  Yes Smoking:  No Previous Surgery:  No CV History:  TIA with symptoms on 06/03/2009 Amaurosis Fugax No, Paresthesias No, Hemiparesis No                                      RIGHT             LEFT Brachial systolic pressure:         132               152 Brachial Doppler waveforms:         Triphasic         Triphasic Vertebral direction of flow:        Antegrade         Antegrade DUPLEX VELOCITIES (cm/sec) CCA peak systolic                   64                96 ECA peak systolic                   124               107 ICA peak systolic                   280               128 ICA end diastolic                   89                24 PLAQUE MORPHOLOGY:                  Heterogeneous     Heterogeneous PLAQUE AMOUNT:                      Moderate          Mild PLAQUE LOCATION:                    CCA, ECA, ICA     CCA, ECA, ICA  IMPRESSION: 1. 60%-79% right internal carotid artery stenosis with soft plaque     noted. 2. 40%-59% left internal carotid artery stenosis (lower end of range). 3. Antegrade vertebral artery flow bilaterally.  ___________________________________________ Larina Earthly, M.D.  CI/MEDQ  D:  06/13/2012  T:  06/13/2012  Job:  [id number]

## 2012-06-20 ENCOUNTER — Other Ambulatory Visit: Payer: Medicare Other

## 2012-06-24 ENCOUNTER — Other Ambulatory Visit: Payer: Self-pay | Admitting: Pulmonary Disease

## 2012-06-24 ENCOUNTER — Telehealth: Payer: Self-pay | Admitting: Pulmonary Disease

## 2012-06-24 DIAGNOSIS — F419 Anxiety disorder, unspecified: Secondary | ICD-10-CM

## 2012-06-24 DIAGNOSIS — N320 Bladder-neck obstruction: Secondary | ICD-10-CM

## 2012-06-24 DIAGNOSIS — D126 Benign neoplasm of colon, unspecified: Secondary | ICD-10-CM

## 2012-06-24 DIAGNOSIS — Z125 Encounter for screening for malignant neoplasm of prostate: Secondary | ICD-10-CM

## 2012-06-24 DIAGNOSIS — R7309 Other abnormal glucose: Secondary | ICD-10-CM

## 2012-06-24 DIAGNOSIS — I10 Essential (primary) hypertension: Secondary | ICD-10-CM

## 2012-06-24 DIAGNOSIS — E7800 Pure hypercholesterolemia, unspecified: Secondary | ICD-10-CM

## 2012-06-24 DIAGNOSIS — E559 Vitamin D deficiency, unspecified: Secondary | ICD-10-CM

## 2012-06-24 NOTE — Telephone Encounter (Signed)
I spoke with spouse and is aware of this. She states she would also like pt to have the CRP drawn as well. Please advise SN THANKS

## 2012-06-24 NOTE — Telephone Encounter (Signed)
Please let the pt know that his labs are in the computer for him.  thanks

## 2012-06-24 NOTE — Telephone Encounter (Signed)
Please advise what labs pt will need to have done SN, thanks

## 2012-06-25 NOTE — Telephone Encounter (Signed)
Labs have been ordered for the pt.  Nothing further needed.

## 2012-06-27 ENCOUNTER — Other Ambulatory Visit (INDEPENDENT_AMBULATORY_CARE_PROVIDER_SITE_OTHER): Payer: Medicare Other

## 2012-06-27 DIAGNOSIS — F419 Anxiety disorder, unspecified: Secondary | ICD-10-CM

## 2012-06-27 DIAGNOSIS — F411 Generalized anxiety disorder: Secondary | ICD-10-CM

## 2012-06-27 DIAGNOSIS — E7800 Pure hypercholesterolemia, unspecified: Secondary | ICD-10-CM

## 2012-06-27 DIAGNOSIS — N320 Bladder-neck obstruction: Secondary | ICD-10-CM

## 2012-06-27 DIAGNOSIS — Z125 Encounter for screening for malignant neoplasm of prostate: Secondary | ICD-10-CM

## 2012-06-27 DIAGNOSIS — D126 Benign neoplasm of colon, unspecified: Secondary | ICD-10-CM

## 2012-06-27 DIAGNOSIS — N139 Obstructive and reflux uropathy, unspecified: Secondary | ICD-10-CM

## 2012-06-27 DIAGNOSIS — E559 Vitamin D deficiency, unspecified: Secondary | ICD-10-CM

## 2012-06-27 DIAGNOSIS — R7309 Other abnormal glucose: Secondary | ICD-10-CM

## 2012-06-27 LAB — CBC WITH DIFFERENTIAL/PLATELET
Basophils Absolute: 0 10*3/uL (ref 0.0–0.1)
Basophils Relative: 0.3 % (ref 0.0–3.0)
Eosinophils Absolute: 0.2 10*3/uL (ref 0.0–0.7)
Eosinophils Relative: 2.5 % (ref 0.0–5.0)
HCT: 46.7 % (ref 39.0–52.0)
Hemoglobin: 15.9 g/dL (ref 13.0–17.0)
Lymphocytes Relative: 23.8 % (ref 12.0–46.0)
Lymphs Abs: 1.5 10*3/uL (ref 0.7–4.0)
MCHC: 34 g/dL (ref 30.0–36.0)
MCV: 100.7 fl — ABNORMAL HIGH (ref 78.0–100.0)
Monocytes Absolute: 0.6 10*3/uL (ref 0.1–1.0)
Monocytes Relative: 9.6 % (ref 3.0–12.0)
Neutro Abs: 4 10*3/uL (ref 1.4–7.7)
Neutrophils Relative %: 63.8 % (ref 43.0–77.0)
Platelets: 184 10*3/uL (ref 150.0–400.0)
RBC: 4.63 Mil/uL (ref 4.22–5.81)
RDW: 13.6 % (ref 11.5–14.6)
WBC: 6.3 10*3/uL (ref 4.5–10.5)

## 2012-06-27 LAB — HEPATIC FUNCTION PANEL
ALT: 27 U/L (ref 0–53)
AST: 32 U/L (ref 0–37)
Albumin: 4 g/dL (ref 3.5–5.2)
Alkaline Phosphatase: 76 U/L (ref 39–117)
Bilirubin, Direct: 0.2 mg/dL (ref 0.0–0.3)
Total Bilirubin: 1 mg/dL (ref 0.3–1.2)
Total Protein: 6.7 g/dL (ref 6.0–8.3)

## 2012-06-27 LAB — LIPID PANEL
Cholesterol: 127 mg/dL (ref 0–200)
HDL: 27.1 mg/dL — ABNORMAL LOW (ref >39.00)
LDL Cholesterol: 67 mg/dL (ref 0–99)
Total CHOL/HDL Ratio: 5
Triglycerides: 164 mg/dL — ABNORMAL HIGH (ref 0.0–149.0)
VLDL: 32.8 mg/dL (ref 0.0–40.0)

## 2012-06-27 LAB — PSA: PSA: 1.4 ng/mL (ref 0.10–4.00)

## 2012-06-27 LAB — TSH: TSH: 2.69 u[IU]/mL (ref 0.35–5.50)

## 2012-06-27 LAB — HEMOGLOBIN A1C: Hgb A1c MFr Bld: 6.4 % (ref 4.6–6.5)

## 2012-06-27 LAB — C-REACTIVE PROTEIN: CRP: <1 mg/dL (ref 1–20)

## 2012-06-28 LAB — VITAMIN D 25 HYDROXY (VIT D DEFICIENCY, FRACTURES): Vit D, 25-Hydroxy: 34 ng/mL (ref 30–89)

## 2012-06-30 ENCOUNTER — Encounter: Payer: Self-pay | Admitting: Vascular Surgery

## 2012-06-30 ENCOUNTER — Encounter: Payer: Self-pay | Admitting: Neurosurgery

## 2012-07-01 ENCOUNTER — Ambulatory Visit (INDEPENDENT_AMBULATORY_CARE_PROVIDER_SITE_OTHER): Payer: Medicare Other | Admitting: Vascular Surgery

## 2012-07-01 ENCOUNTER — Encounter: Payer: Self-pay | Admitting: Vascular Surgery

## 2012-07-01 VITALS — BP 143/62 | HR 88 | Temp 97.9°F | Ht 74.0 in | Wt 231.0 lb

## 2012-07-01 DIAGNOSIS — I779 Disorder of arteries and arterioles, unspecified: Secondary | ICD-10-CM | POA: Insufficient documentation

## 2012-07-01 DIAGNOSIS — I6529 Occlusion and stenosis of unspecified carotid artery: Secondary | ICD-10-CM

## 2012-07-01 NOTE — Progress Notes (Signed)
The patient presents today for followup of known asymptomatic carotid disease. He had a recent carotid duplex on 06/13/2012 sunning some progression in his right carotid stenosis. He is here for discussion of this study. I had last seen him on June of 2011 and he is been seen in our office for followup on a yearly basis. He specifically denies any history of amaurosis fugax transient ischemic attack or stroke. He did have a remote history of slight droop in his left lip and 2010. He has had no similar event since that time. He is status post coronary bypass grafting and remained stable from a coronary standpoint.  Past Medical History  Diagnosis Date  . Hypertension   . Coronary artery disease   . Cerebrovascular disease   . Renal insufficiency   . Hypercholesterolemia   . Diabetes mellitus   . Overweight   . Personal history of colonic polyps 10/08/2006    tubular adenomas  . Shoulder pain   . Vitamin d deficiency   . Gynecomastia   . Transient ischemic attack   . Keloid   . Neurodermatitis     History  Substance Use Topics  . Smoking status: Never Smoker   . Smokeless tobacco: Current User   Comment: uses dip 3-4 times per week  . Alcohol Use: No    Family History  Problem Relation Age of Onset  . Colon cancer Neg Hx   . Lung cancer Paternal Uncle     questionable as to if it was lung ca  . Heart disease Mother   . Diabetes Mother   . Kidney disease Mother     Allergies  Allergen Reactions  . Niacin     REACTION: intol to NIACIN w/ headaches    Current outpatient prescriptions:aspirin 81 MG tablet, Take 81 mg by mouth daily.  , Disp: , Rfl: ;  atorvastatin (LIPITOR) 20 MG tablet, Take 10 mg by mouth daily. Take as directed, Disp: , Rfl: ;  Cholecalciferol (VITAMIN D) 2000 UNITS CAPS, Take 1 capsule by mouth daily.  , Disp: , Rfl: ;  clopidogrel (PLAVIX) 75 MG tablet, TAKE 1 TABLET BY MOUTH EVERY DAY, Disp: 30 tablet, Rfl: 11 DIOVAN 160 MG tablet, TAKE 1 TABLET BY MOUTH  EVERY DAY, Disp: 30 tablet, Rfl: 6;  hydrochlorothiazide (MICROZIDE) 12.5 MG capsule, Take 1 capsule (12.5 mg total) by mouth daily., Disp: 90 capsule, Rfl: 3;  sildenafil (VIAGRA) 100 MG tablet, Use as directed , Disp: , Rfl:   BP 143/62  Pulse 88  Temp 97.9 F (36.6 C) (Oral)  Ht 6\' 2"  (1.88 m)  Wt 231 lb (104.781 kg)  BMI 29.66 kg/m2  SpO2 97%  Body mass index is 29.66 kg/(m^2).        Physical exam: Well-developed well-nourished white male no acute distress HEENT normal Chest clear bilaterally without wheezes Heart regular rate and rhythm Abdomen mildly obese no masses and no aneurysm palpated Her logically grossly intact Pulse status: 2+ radial and 2+ femoral pulses Carotid arteries: I do not appreciate bruits today  Carotid duplex from our office on 06/13/2012 reveal right stenosis in the 60-79% range and left in the 40-59% range  Impression and plan: Asymptomatic carotid stenosis. I again discussed symptoms with the patient understands. We will see him again in 6 months for routine surveillance followup

## 2012-07-02 ENCOUNTER — Encounter: Payer: Self-pay | Admitting: Pulmonary Disease

## 2012-07-02 ENCOUNTER — Other Ambulatory Visit (INDEPENDENT_AMBULATORY_CARE_PROVIDER_SITE_OTHER): Payer: Medicare Other

## 2012-07-02 ENCOUNTER — Ambulatory Visit (INDEPENDENT_AMBULATORY_CARE_PROVIDER_SITE_OTHER)
Admission: RE | Admit: 2012-07-02 | Discharge: 2012-07-02 | Disposition: A | Discharge status: Home / Self Care | Payer: Medicare Other | Source: Ambulatory Visit | Attending: Pulmonary Disease | Admitting: Pulmonary Disease

## 2012-07-02 ENCOUNTER — Ambulatory Visit (INDEPENDENT_AMBULATORY_CARE_PROVIDER_SITE_OTHER): Payer: Medicare Other | Admitting: Pulmonary Disease

## 2012-07-02 VITALS — BP 148/82 | HR 73 | Temp 96.8°F | Ht 74.0 in | Wt 231.8 lb

## 2012-07-02 DIAGNOSIS — I2510 Atherosclerotic heart disease of native coronary artery without angina pectoris: Secondary | ICD-10-CM

## 2012-07-02 DIAGNOSIS — I10 Essential (primary) hypertension: Secondary | ICD-10-CM

## 2012-07-02 DIAGNOSIS — R7309 Other abnormal glucose: Secondary | ICD-10-CM

## 2012-07-02 DIAGNOSIS — E663 Overweight: Secondary | ICD-10-CM

## 2012-07-02 DIAGNOSIS — D126 Benign neoplasm of colon, unspecified: Secondary | ICD-10-CM

## 2012-07-02 DIAGNOSIS — I679 Cerebrovascular disease, unspecified: Secondary | ICD-10-CM

## 2012-07-02 DIAGNOSIS — E7800 Pure hypercholesterolemia, unspecified: Secondary | ICD-10-CM

## 2012-07-02 DIAGNOSIS — G459 Transient cerebral ischemic attack, unspecified: Secondary | ICD-10-CM

## 2012-07-02 DIAGNOSIS — I872 Venous insufficiency (chronic) (peripheral): Secondary | ICD-10-CM

## 2012-07-02 LAB — BASIC METABOLIC PANEL
BUN: 17 mg/dL (ref 6–23)
CO2: 30 mEq/L (ref 19–32)
Calcium: 9.1 mg/dL (ref 8.4–10.5)
Chloride: 102 mEq/L (ref 96–112)
Creatinine, Ser: 1 mg/dL (ref 0.4–1.5)
GFR: 81.65 mL/min (ref >60.00)
Glucose, Bld: 122 mg/dL — ABNORMAL HIGH (ref 70–99)
Potassium: 4 mEq/L (ref 3.5–5.1)
Sodium: 140 mEq/L (ref 135–145)

## 2012-07-02 MED ORDER — TADALAFIL 20 MG PO TABS: ORAL_TABLET | ORAL | Status: DC

## 2012-07-02 NOTE — Patient Instructions (Addendum)
Today we updated your med list in our EPIC system...    Continue your current medications the same...     We reviewed your recent fasting blood work & gave you a copy...    The BMET was omitted & we will check that today along w/ a f/u CXR...    We will call you w/ the results when avail...  Stay as active as possible, & consider the Cardiology HDL research trial...  Let me know if you have any questions...  Let's plan a follow up visit in 6 months.Marland KitchenMarland Kitchen

## 2012-07-02 NOTE — Progress Notes (Signed)
Subjective:    Patient ID: [name] [name], male    DOB: 07/29/1941, 71 y.o.   MRN: [id number]  HPI 71 y/o WM here for a follow up visit... he has multiple medical problems as noted below...    ~  Jun10:  Hosp w/ TIA w/ left face & arm symptoms that resolved quickly... CT showed small vessel disease;  MRI showed bilat remote lacunar infarcts, no acute infarct, +sm vessel dis;  MRA showed intracranial atherosclerotic changes, & ?focal stenosis (?70%) of left ICA in the neck... 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  he states that he stopped the Plavix after just several days due to swelling in his feet that resolved off the Plavix Rx- we discussed this and I strongly rec that he restart the Plavix due to his recent TIA...  ~  May 16, 2010:  still not dieting or exercising & wt stable ~233#> we discussed needed diet + exercise program for weight reduction... BP controlled on the Diovan;  denies angina etc & due for f/u [name] [name] soon;  he continues on the ASA/ Plavix w/o TIAs etc;  FLP looks good on Lip10 x low HDL & rec for incr exercise...   ~  November 14, 2010:  he's had a good 36mo- no new complaints or concerns... he saw [name] [name] 6/11 for yearly f/u doing satis & no changes made... he mentioned that [name] [name] wanted to decr Lipitor10 to 5mg /d since his LDL was in the 60's & add Niaspan to help raise his HDL but he refuses Niacin preps due to HA & flushing, and he prefers to continue Lip10 daily... BP sl elev today but he states all 130s/ 70s at home;  weight still in the 235# range & we reviewed diet + exercise perscription for him;  there have been no interval cerebral ischemic events on ASA & Plavix...   ~  May 16, 2011:  36mo ROV & he's been stable overall but noted BP up yest w/ stress (wife had hand surg)- refuses anxiolytic med, states BP ret to norm on it's own (he also takes Doivan160);  Stable on ASA/  Plavix w/o cerebral ischemic symptoms & he is due f/u w/ [name] [name] soon ;  FLP looks good on Lip10 daily & BS/ A1c are normal on diet alone, but wt is only down 6# to 229# today... Requests refills for 30d supplies.  ~  January 03, 2012:  7-37mo ROV     [name] on Diovan160 & intol to BBlockers in past; BP= 152/68 but he says better at home; we discussed the need for tighter control & rec adding NORVASC 5mg /d...    CAD> s/p 4 vessel CABG 2005, on ASA/ Plavix; he saw [name] [name] 6/12 for yearly check up- stable & no changes made...    Cerebrovasc Dis> on ASA/ Plavix; followed by [name] [name] but the last note & CDoppler we have is 6/10==> we will contact his office for f/u notes & Duplex reports...    CHOL> on Lip20- taking 1/2 tab daily; last FLP 5/12 looked good x low HDL=28; he is intol Niacin & rec to incr exercise program which he has yet to do...    Overweight> weight= 234# which is up 5# despite mult attempts at diet, exercise, wt loss program...    Hx colon polyps> last colon 10/07 by [name] [name] & follow up due 10/12; we will send referral to his office.Marland KitchenMarland Kitchen  DJD/ Vit D defic> he uses OTC analgesics as needed; continues on Vit D 2000u daily...    Hx TIA> SEE 6/10 Hosp> sm vessel dis, remote lacunar infarcts, intracranial atherosclerotic changes on MRA; CDopplers followed by [name] [name] w/ 40-59% bilat ICA stenoses but we don't have recent notes & we will contact his office for the data...  ~  July 02, 2012:  65mo ROV & [name] [name] reports that he is doing well w/o new complaints or concerns...    He saw [name] [name] 7/13 for f/u asymptomatic carotid dis> CDoppler 6/13 showed some progression of right sided stenosis (now 60-79% but denies cerebral ischemic symptoms (right side remains 40-59%); remains on ASA/Plavix & f/u planned 65mo.    He saw [name] [name] 6/13 for f/u CAD, CABG 2005, HBP, asymptomatic carotid dis> norm LVF, no angina, etc; he stopped the Norvasc due to swelling & substituted HCT 12.5mg /d...    He saw [name] [name]  2/13 for a follow up colonoscopy that was neg- no lesions seen & f/u rec in 35yrs... We reviewed prob list, meds, xrays and labs> see below>> CXR 7/13 showed cardiomeg, s/p CABG, bronchitic changes at the lung bases, NAD... LABS 7/13:  FLP-  Chol ok on Lip10 but TG=164 HDL=27;  Chems- ok x BS=122 A1c=6.4 on diet alone;  CBC- wnl;  TSH=2.69;  PSA=1.40;  VitD=34          Problem List:    HYPERTENSION (ICD-401.9) >> ~  6/09:  prev on Atenolol25mg - stopped due to side effects & feels much better off BBlockers. ~  2/10:  Avapro300 changed to Diovan160 per his insurance company for $$$ reasons. ~  11/11:  BP= 150/80 & remains asymptomatic, doesn't want to incr meds- discussed diet/ exerc... ~  5/12:  BP= 132/70 & denies HA, fatigue, visual changes, CP, palipit, dizziness, syncope, dyspnea, edema, etc... ~  1/13:  BP= 152/68 & he remains asymptomatic; we decided to add NORVASC 5mg /d ==> pt stopped on his own due to swelling. ~  6/13:  He saw [name] [name] for f/u & BP= 154/80 on Diovan alone; added HCTZ 12.5mg /d... ~  7/13:  BP= 148/82 here & 130s/70s at home he says on DIOVAN160mg /d & HCTZ 12.5mg /d; continue same  CORONARY ARTERY DISEASE (ICD-414.00) - S/P 4 vessel CABG 2/05 by [name] [name]> on ASA/ PLAVIX & followed by [name] [name] yearly & his notes are reviewed; he is intol to BBlocker therapy as noted... ~  1/13 & 7/13:  stable & denies angina, palpit, SOB, edema, etc...  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 followed by [name] [name] for [facility]. ~  6/10:  See 6/10 Hosp records & f/u by [name] [name] in his office... 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 ~  He was due for a follow up eval 6/12> we don't have records & will call for the report... ~  6/13:  He saw [name] [name] for f/u asymptomatic carotid dis> CDoppler 6/13 showed some progression of right sided stenosis (now 60-79%) but denies cerebral  ischemic symptoms (right side remains 40-59%); continue ASA/Plavix & f/u planned 65mo.  VENOUS INSUFFICIENCY (ICD-459.81) - he is aware of need to elim salt, elevate legs, wear support hose.  HYPERCHOLESTEROLEMIA (ICD-272.0) - controlled on LIPITOR 20- 1/2 tab daily... he has tried NIACIN in the past and refuses to take it again due to headaches. ~  FLP 5/08 showed TChol 125, TG 171, HDL 24, LDL 67...  ~  FLP 12/08 showed TChol 107, TG 131, HDL 23, LDL 58... rec- same med, better diet & exercise. ~  FLP 2/10 on Lip10 (wt=235#) showed TChol 119, TG 158, HDL 24, LDL 63... rec- ditto ~  FLP 11/10 on Lip10 (wt=236#) showed TChol 115, TG 128, HDL 28, LDL 62 ~  FLP 5/11 on Lip10 (wt=233#) showed TChol 119, TG 144, HDL 27, LDL 63 ~  [name] [name] suggested that he cut the Lipitor to 5mg /d & add Niaspan, but he refuses the Niacin preps due to HAs & wants to contin Lip10. ~  FLP 11/11 on Lip10 (wt=235#) showed TChol 119, TG 113, HDL 31, LDL 66 ~  FLP 5/12 on Lip10 (wt=229#) showed TChol 118, TG 150, HDL 28, LDL 60 ~  FLP 6/13 on Lip10 (wt=232#) showed TChol 127, TG 164, HDL 27, LDL 67... He tells me [name] [name] wanted him in an HDL trial...  DIABETES MELLITUS, BORDERLINE (ICD-790.29) - On diet Rx alone... ~  labs in hosp 6/10 showed BS= 109-113 ~  labs 11/10 showed BS= 109, A1c= 5.9 ~  labs 5/11 showed BS= 107 ~  labs 11/11 showed BS= 108 ~  Labs 5/12 showed BS= 97, A1c= 6.0 ~  Labs 6/13 showed BS=122, A1c= 6.4... Needs better diet & wt reduction.  OVERWEIGHT (ICD-278.02) - he is 6'2" tall and weighs ~230# for a BMI of 30... we have discussed diet & exercise stategies.  COLONIC POLYPS (ICD-211.3) - last colonoscopy 10/07 by [name] [name] showed several 5-6 mm polyps... path= tubular adenomas ... f/u planned 5 years. ~  Colonoscopy 2/13 by [name] [name] was neg- no recurrent polyps etc & he felt that 80yr f/u was appropriate...  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 Rx w/ PT.  VITAMIN D DEFIC >> on Vit D supplement 2000u daily... ~  Labs 2/10 showed Vit D level = 14... rec to start Vit D supplement OTC... ~  Labs 6/13 showed Vit D level = 34... rec to continue his VitD supplement daily...  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  ~  6/13: saw [name] [name] for f/u asymptomatic carotid dis> CDoppler 6/13 showed some progression of right sided stenosis (now 60-79%) but denies cerebral ischemic symptoms (right side remains 40-59%); continue ASA/Plavix & f/u planned 73mo.  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 he had plastic surg by [facility] 5/09 w/ improved scar.  NEURODERMATITIS (ICD-698.3)  HEALTH MAINTENANCE: ~  GI:  followed by [name] [name] & last colonoscopy was 10/07 w/ f/u planned 84yrs. ~  GU:  he is up to date on DRE & PSA checks here (PSA remains in the 1-2 range). ~  Immunizations:  he had PNEUMOVAX & TETANUS in 2000 w/ repeat PNEUMOVAX 11/10 & TDAP 5/11... he gets the yearly Seasonal Flu vaccine each fall...   Past Surgical History  Procedure Date  .  Pilonidial cystectomy 1989  . Keloid surgery on chest scar 04/2008    Dr. Stephens November  . Coronary artery bypass graft     4 vessel    Outpatient Encounter Prescriptions as of 07/02/2012  Medication Sig Dispense Refill  . aspirin 81 MG tablet Take 81 mg  by mouth daily.        [name] atorvastatin (LIPITOR) 20 MG tablet Take 10 mg by mouth daily. Take as directed      . Cholecalciferol (VITAMIN D) 2000 UNITS CAPS Take 1 capsule by mouth daily.        . clopidogrel (PLAVIX) 75 MG tablet TAKE 1 TABLET BY MOUTH EVERY DAY  30 tablet  11  . DIOVAN 160 MG tablet TAKE 1 TABLET BY MOUTH EVERY DAY  30 tablet  6  . hydrochlorothiazide (MICROZIDE) 12.5 MG capsule Take 1 capsule (12.5 mg total) by mouth daily.  90 capsule  3  . sildenafil (VIAGRA) 100 MG tablet Use as directed         Allergies  Allergen Reactions  . Niacin     REACTION: intol to NIACIN w/ headaches    Current Medications, Allergies, Past Medical History, Past Surgical History, Family History, and Social History were reviewed in [employer] record.    Review of Systems         See HPI - all other systems neg except as noted... The patient denies anorexia, fever, weight loss, weight gain, vision loss, decreased hearing, hoarseness, chest pain, syncope, dyspnea on exertion, peripheral edema, prolonged cough, headaches, hemoptysis, abdominal pain, melena, hematochezia, severe indigestion/heartburn, hematuria, incontinence, muscle weakness, suspicious skin lesions, transient blindness, difficulty walking, depression, unusual weight change, abnormal bleeding, enlarged lymph nodes, and angioedema.     Objective:   Physical Exam    WD, Overweight, 71 y/o WM in NAD... Vital Signs:  Reviewed> GENERAL:  Alert & oriented; pleasant & cooperative. HEENT:  [place]/AT, EOM-wnl, PERRLA, Fundi-benign, EACs-clear, TMs-wnl, NOSE-clear, THROAT-clear & wnl. NECK:  Supple w/ fairROM; no JVD; normal carotid impulses w/o bruits; no thyromegaly or nodules palpated; no lymphadenopathy. CHEST:  Clear to P & A; without wheezes/ rales/ or rhonchi. HEART:  Median sternotomy scar w/ improved keloid, regular rhythm; without murmurs/ rubs/ or gallops. ABDOMEN:  Soft & nontender; normal bowel  sounds; no organomegaly or masses detected. 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 EXT: without deformities, mild arthritic changes; + venous insuffic changes, no edema. NEURO:  CN's intact; motor testing normal; sensory testing normal; gait normal & balance OK. DERM:  Keloid as noted, no other lesions...  RADIOLOGY DATA:  Reviewed in the EPIC EMR & discussed w/ the patient...  LABORATORY DATA:  Reviewed in the EPIC EMR & discussed w/ the patient...   Assessment & Plan:   [name]  On Diovan160 & HCT 12.5 w/ improved BP; needs to lose wt & elim sodium...  CEREBROVASC DIS/ TIA>  No further cerebral ischemic symptoms on the ASA/ Plavix; f/u CDoppler w/ progression on the right & [name] [name] plans 35mo ROV.  CHOL>  On Lip10 & stable; needs better low fat diet & get wt down; [name] [name] wanted in in a drug study...  DM> adeq control w/ diet alone but he understands the importance of wt reduction.  GI>  He had colonoscopy f/u 2/13 & it was neg...  Other medical problems as noted...   Patient's Medications  New Prescriptions   TADALAFIL (CIALIS) 20 MG TABLET    Take as directed  Previous  Medications   ASPIRIN 81 MG TABLET    Take 81 mg by mouth daily.     ATORVASTATIN (LIPITOR) 20 MG TABLET    Take 10 mg by mouth daily. Take as directed   CHOLECALCIFEROL (VITAMIN D) 2000 UNITS CAPS    Take 1 capsule by mouth daily.     CLOPIDOGREL (PLAVIX) 75 MG TABLET    TAKE 1 TABLET BY MOUTH EVERY DAY   DIOVAN 160 MG TABLET    TAKE 1 TABLET BY MOUTH EVERY DAY   HYDROCHLOROTHIAZIDE (MICROZIDE) 12.5 MG CAPSULE    Take 1 capsule (12.5 mg total) by mouth daily.  Modified Medications   Modified Medication Previous Medication   ATORVASTATIN (LIPITOR) 20 MG TABLET atorvastatin (LIPITOR) 20 MG tablet      TAKE 1 TABLET BY MOUTH DAILY    Take 1 tablet (20 mg total) by mouth daily.  Discontinued Medications   SILDENAFIL (VIAGRA) 100 MG TABLET    Use as directed

## 2012-07-09 ENCOUNTER — Other Ambulatory Visit: Payer: Self-pay | Admitting: Pulmonary Disease

## 2012-07-11 ENCOUNTER — Telehealth: Payer: Self-pay | Admitting: Pulmonary Disease

## 2012-07-11 NOTE — Telephone Encounter (Signed)
Pt returning Leighs call--Spoke with patient, patient aware of labs and cxr results; expressed understanding. Result note documented and saved to chart. Caelin Rosen Mabe, C.MA

## 2012-08-19 ENCOUNTER — Other Ambulatory Visit (INDEPENDENT_AMBULATORY_CARE_PROVIDER_SITE_OTHER): Payer: Medicare Other

## 2012-08-19 DIAGNOSIS — I2510 Atherosclerotic heart disease of native coronary artery without angina pectoris: Secondary | ICD-10-CM

## 2012-08-19 DIAGNOSIS — I10 Essential (primary) hypertension: Secondary | ICD-10-CM

## 2012-08-19 LAB — BASIC METABOLIC PANEL
BUN: 16 mg/dL (ref 6–23)
CO2: 28 mEq/L (ref 19–32)
Calcium: 8.8 mg/dL (ref 8.4–10.5)
Chloride: 103 mEq/L (ref 96–112)
Creatinine, Ser: 1.1 mg/dL (ref 0.4–1.5)
GFR: 68.43 mL/min (ref >60.00)
Glucose, Bld: 163 mg/dL — ABNORMAL HIGH (ref 70–99)
Potassium: 3.7 mEq/L (ref 3.5–5.1)
Sodium: 138 mEq/L (ref 135–145)

## 2012-08-25 ENCOUNTER — Telehealth: Payer: Self-pay | Admitting: Cardiovascular Disease

## 2012-08-25 NOTE — Telephone Encounter (Signed)
Pt calling re results of lab test, and pls mail pt a copy

## 2012-12-09 ENCOUNTER — Ambulatory Visit (INDEPENDENT_AMBULATORY_CARE_PROVIDER_SITE_OTHER): Payer: Medicare Other | Admitting: Cardiovascular Disease

## 2012-12-09 ENCOUNTER — Encounter: Payer: Self-pay | Admitting: Cardiovascular Disease

## 2012-12-09 VITALS — BP 132/68 | HR 74 | Resp 18 | Ht 74.0 in | Wt 232.0 lb

## 2012-12-09 DIAGNOSIS — I872 Venous insufficiency (chronic) (peripheral): Secondary | ICD-10-CM

## 2012-12-09 DIAGNOSIS — I2510 Atherosclerotic heart disease of native coronary artery without angina pectoris: Secondary | ICD-10-CM

## 2012-12-09 DIAGNOSIS — G459 Transient cerebral ischemic attack, unspecified: Secondary | ICD-10-CM

## 2012-12-09 DIAGNOSIS — I10 Essential (primary) hypertension: Secondary | ICD-10-CM

## 2012-12-09 MED ORDER — VALSARTAN 160 MG PO TABS: 160.0000 mg | ORAL_TABLET | Freq: Every day | ORAL | Status: DC

## 2012-12-09 NOTE — Assessment & Plan Note (Signed)
Well controlled.  Continue current medications and low sodium Dash type diet.   Diovan refilled

## 2012-12-09 NOTE — Progress Notes (Signed)
Patient ID: [name] [name], male   DOB: 25-Jun-1941, 71 y.o.   MRN: [id number] [name] [name] seen today in followup. His known coronary disease with previous coronary bypass surgery in 2005. he has normal LV function. His risk factors are well modified  Not having significant chest pain PND orthopnea is really active. He has 3 young grandchildren and is quite busy with them. He sees  Dr. Kriste [name] for his primary care needs. He had a TIA  2010  with mild facial droop for about 3 minutes. W/U negative. On Plavix. BP a little high. Norvasc caused swelling of feet and stopped  Last carotid 6/13 at [facility] with Dr [name] [name] showed 60-79% RICA and 40-59% LICA.  Needs diovan refilled  ROS: Denies fever, malais, weight loss, blurry vision, decreased visual acuity, cough, sputum, SOB, hemoptysis, pleuritic pain, palpitaitons, heartburn, abdominal pain, melena, lower extremity edema, claudication, or rash.  All other systems reviewed and negative  General: Affect appropriate Overweight white male HEENT: normal Neck supple with no adenopathy JVP normal no bruits no thyromegaly Lungs clear with no wheezing and good diaphragmatic motion Heart:  S1/S2 no murmur, no rub, gallop or click PMI normal Abdomen: benighn, BS positve, no tenderness, no AAA no bruit.  No HSM or HJR Distal pulses intact with no bruits No edema Neuro non-focal Skin warm and dry No muscular weakness   Current Outpatient Prescriptions  Medication Sig Dispense Refill  . aspirin 81 MG tablet Take 81 mg by mouth daily.        [name] atorvastatin (LIPITOR) 20 MG tablet Take 10 mg by mouth daily. Take as directed      . Cholecalciferol (VITAMIN D) 2000 UNITS CAPS Take 1 capsule by mouth daily.        . clopidogrel (PLAVIX) 75 MG tablet TAKE 1 TABLET BY MOUTH EVERY DAY  30 tablet  11  . DIOVAN 160 MG tablet TAKE 1 TABLET BY MOUTH EVERY DAY  30 tablet  6  . hydrochlorothiazide (MICROZIDE) 12.5 MG capsule Take 1 capsule (12.5 mg total) by mouth daily.  90  capsule  3  . tadalafil (CIALIS) 20 MG tablet Take as directed  10 tablet  5  . [DISCONTINUED] atorvastatin (LIPITOR) 20 MG tablet TAKE 1 TABLET BY MOUTH DAILY  30 tablet  11    Allergies  Niacin  Electrocardiogram: 06/10/12 SR rate 71 normal   Assessment and Plan

## 2012-12-09 NOTE — Patient Instructions (Addendum)
Your physician wants you to follow-up in:  6 MONTHS WITH DR NISHAN  You will receive a reminder letter in the mail two months in advance. If you don't receive a letter, please call our office to schedule the follow-up appointment. Your physician recommends that you continue on your current medications as directed. Please refer to the Current Medication list given to you today. 

## 2012-12-09 NOTE — Assessment & Plan Note (Signed)
Improved with diuretic Low sodium and weight loss advised

## 2012-12-09 NOTE — Assessment & Plan Note (Signed)
Non recurrent on ASA  Called [facility] to schedule carotids this month

## 2012-12-09 NOTE — Assessment & Plan Note (Signed)
Stable with no angina and good activity level.  Continue medical Rx  

## 2012-12-23 ENCOUNTER — Other Ambulatory Visit: Payer: Self-pay | Admitting: *Deleted

## 2012-12-23 DIAGNOSIS — I6529 Occlusion and stenosis of unspecified carotid artery: Secondary | ICD-10-CM

## 2012-12-30 ENCOUNTER — Encounter: Payer: Self-pay | Admitting: Neurosurgery

## 2013-01-01 ENCOUNTER — Encounter: Payer: Self-pay | Admitting: Neurosurgery

## 2013-01-01 ENCOUNTER — Other Ambulatory Visit (INDEPENDENT_AMBULATORY_CARE_PROVIDER_SITE_OTHER): Payer: Medicare Other | Admitting: *Deleted

## 2013-01-01 ENCOUNTER — Ambulatory Visit (INDEPENDENT_AMBULATORY_CARE_PROVIDER_SITE_OTHER): Payer: Medicare Other | Admitting: Neurosurgery

## 2013-01-01 VITALS — BP 147/85 | HR 65 | Resp 18 | Ht 74.0 in | Wt 228.0 lb

## 2013-01-01 DIAGNOSIS — I6529 Occlusion and stenosis of unspecified carotid artery: Secondary | ICD-10-CM

## 2013-01-01 NOTE — Progress Notes (Signed)
[facility] Carot[facility] Office Note  CC: Carotid surveillance Referring Physician: Early  History of Present Illness: 72 year old male patient of Dr. Arbie Cookey with no history of carotid intervention. The patient denies any signs or symptoms of CVA, TIA, amaurosis fugax or any neural deficit. The patient denies any new medical diagnoses or recent surgery.  Past Medical History  Diagnosis Date  . Hypertension   . Coronary artery disease   . Cerebrovascular disease   . Renal insufficiency   . Hypercholesterolemia   . Diabetes mellitus   . Overweight   . Personal history of colonic polyps 10/08/2006    tubular adenomas  . Shoulder pain   . Vitamin D deficiency   . Gynecomastia   . Transient ischemic attack   . Keloid   . Neurodermatitis     ROS: [x]  Positive   [ ]  Denies    General: [ ]  Weight loss, [ ]  Fever, [ ]  chills Neurologic: [ ]  Dizziness, [ ]  Blackouts, [ ]  Seizure [ ]  Stroke, [ ]  "Mini stroke", [ ]  Slurred speech, [ ]  Temporary blindness; [ ]  weakness in arms or legs, [ ]  Hoarseness Cardiac: [ ]  Chest pain/pressure, [ ]  Shortness of breath at rest [ ]  Shortness of breath with exertion, [ ]  Atrial fibrillation or irregular heartbeat Vascular: [ ]  Pain in legs with walking, [ ]  Pain in legs at rest, [ ]  Pain in legs at night,  [ ]  Non-healing ulcer, [ ]  Blood clot in vein/DVT,   Pulmonary: [ ]  Home oxygen, [ ]  Productive cough, [ ]  Coughing up blood, [ ]  Asthma,  [ ]  Wheezing Musculoskeletal:  [ ]  Arthritis, [ ]  Low back pain, [ ]  Joint pain Hematologic: [ ]  Easy Bruising, [ ]  Anemia; [ ]  Hepatitis Gastrointestinal: [ ]  Blood in stool, [ ]  Gastroesophageal Reflux/heartburn, [ ]  Trouble swallowing Urinary: [ ]  chronic Kidney disease, [ ]  on HD - [ ]  MWF or [ ]  TTHS, [ ]  Burning with urination, [ ]  Difficulty urinating Skin: [ ]  Rashes, [ ]  Wounds Psychological: [ ]  Anxiety, [ ]  Depression   Social History History  Substance Use Topics  .  Smoking status: Never Smoker   . Smokeless tobacco: Current User     Comment: uses dip 3-4 times per week  . Alcohol Use: No    Family History Family History  Problem Relation Age of Onset  . Colon cancer Neg Hx   . Lung cancer Paternal Uncle     questionable as to if it was lung ca  . Heart disease Mother   . Diabetes Mother   . Kidney disease Mother     Allergies  Allergen Reactions  . Niacin     REACTION: intol to NIACIN w/ headaches    Current Outpatient Prescriptions  Medication Sig Dispense Refill  . aspirin 81 MG tablet Take 81 mg by mouth daily.        [name] atorvastatin (LIPITOR) 20 MG tablet Take 10 mg by mouth daily. Take as directed      . Cholecalciferol (VITAMIN D) 2000 UNITS CAPS Take 1 capsule by mouth daily.        . clopidogrel (PLAVIX) 75 MG tablet TAKE 1 TABLET BY MOUTH EVERY DAY  30 tablet  11  . hydrochlorothiazide (MICROZIDE) 12.5 MG capsule Take 1 capsule (12.5 mg total) by mouth daily.  90 capsule  3  . tadalafil (CIALIS) 20 MG tablet Take as directed  10 tablet  5  . valsartan (DIOVAN) 160 MG tablet Take 1 tablet (160 mg total) by mouth daily.  30 tablet  11    Physical Examination  Filed Vitals:   01/01/13 1153  BP: 147/85  Pulse: 65  Resp:     Body mass index is 29.27 kg/(m^2).  General:  WDWN in NAD Gait: Normal HEENT: WNL Eyes: Pupils equal Pulmonary: normal non-labored breathing , without Rales, rhonchi,  wheezing Cardiac: RRR, without  Murmurs, rubs or gallops; Abdomen: soft, NT, no masses Skin: no rashes, ulcers noted  Vascular Exam Pulses: 3+ radial pulses bilaterally Carotid bruits: Carotid pulse heard on the left with a mild bruit on the right Extremities without ischemic changes, no Gangrene , no cellulitis; no open wounds;  Musculoskeletal: no muscle wasting or atrophy   Neurologic: A&O X 3; Appropriate Affect ; SENSATION: normal; MOTOR FUNCTION:  moving all extremities equally. Speech is fluent/normal  Non-Invasive  Vascular Imaging CAROTID DUPLEX 01/01/2013  Right ICA 60 - 79 % stenosis Left ICA 20 - 39 % stenosis   ASSESSMENT/PLAN: Asymptomatic patient with unchanged carotid duplex from previous exam 6 months ago. The patient will followup in 6 months with repeat carotid duplex, the patient knows the signs and symptoms of CVA and knows to call 911 should this occur. The patient's questions were encouraged and answered, he is in agreement with this plan.  Lauree Chandler ANP   Clinic MD: Darrick Penna

## 2013-01-01 NOTE — Addendum Note (Signed)
Addended by: Sharee Pimple on: 01/01/2013 03:33 PM   Modules accepted: Orders

## 2013-01-02 ENCOUNTER — Ambulatory Visit (INDEPENDENT_AMBULATORY_CARE_PROVIDER_SITE_OTHER): Payer: Medicare Other | Admitting: Pulmonary Disease

## 2013-01-02 ENCOUNTER — Encounter: Payer: Self-pay | Admitting: Pulmonary Disease

## 2013-01-02 VITALS — BP 142/80 | HR 69 | Temp 97.7°F | Ht 74.0 in | Wt 231.2 lb

## 2013-01-02 DIAGNOSIS — I679 Cerebrovascular disease, unspecified: Secondary | ICD-10-CM

## 2013-01-02 DIAGNOSIS — E663 Overweight: Secondary | ICD-10-CM

## 2013-01-02 DIAGNOSIS — E7800 Pure hypercholesterolemia, unspecified: Secondary | ICD-10-CM

## 2013-01-02 DIAGNOSIS — E559 Vitamin D deficiency, unspecified: Secondary | ICD-10-CM

## 2013-01-02 DIAGNOSIS — I2510 Atherosclerotic heart disease of native coronary artery without angina pectoris: Secondary | ICD-10-CM

## 2013-01-02 DIAGNOSIS — I10 Essential (primary) hypertension: Secondary | ICD-10-CM

## 2013-01-02 DIAGNOSIS — M25519 Pain in unspecified shoulder: Secondary | ICD-10-CM

## 2013-01-02 DIAGNOSIS — R7309 Other abnormal glucose: Secondary | ICD-10-CM

## 2013-01-02 DIAGNOSIS — G459 Transient cerebral ischemic attack, unspecified: Secondary | ICD-10-CM

## 2013-01-02 NOTE — Progress Notes (Signed)
Subjective:    Patient ID: [name] [name], male    DOB: Feb 06, 1941, 72 y.o.   MRN: [id number]  HPI 72 y/o WM here for a follow up visit... he has multiple medical problems as noted below...  SEE PREV EPIC NOTES FOR EARLIER DATA>>  ~  May 16, 2011:  23mo ROV & he's been stable overall but noted BP up yest w/ stress (wife had hand surg)- refuses anxiolytic med, states BP ret to norm on it's own (he also takes Doivan160);  Stable on ASA/ Plavix w/o cerebral ischemic symptoms & he is due f/u w/ [name] soon ;  FLP looks good on Lip10 daily & BS/ A1c are normal on diet alone, but wt is only down 6# to 229# today... Requests refills for 30d supplies.  ~  January 03, 2012:  7-51mo ROV     [name] on Diovan160 & intol to BBlockers in past; BP= 152/68 but he says better at home; we discussed the need for tighter control & rec adding NORVASC 5mg /d...    CAD> s/p 4 vessel CABG 2005, on ASA/ Plavix; he saw [name] 6/12 for yearly check up- stable & no changes made...    Cerebrovasc Dis> on ASA/ Plavix; followed by [name] but the last note & CDoppler we have is 6/10==> we will contact his office for f/u notes & Duplex reports...    CHOL> on Lip20- taking 1/2 tab daily; last FLP 5/12 looked good x low HDL=28; he is intol Niacin & rec to incr exercise program which he has yet to do...    Overweight> weight= 234# which is up 5# despite mult attempts at diet, exercise, wt loss program...    Hx colon polyps> last colon 10/07 by [name] & follow up due 10/12; we will send referral to his office...    DJD/ Vit D defic> he uses OTC analgesics as needed; continues on Vit D 2000u daily...    Hx TIA> SEE 6/10 Hosp> sm vessel dis, remote lacunar infarcts, intracranial atherosclerotic changes on MRA; CDopplers followed by [name] w/ 40-59% bilat ICA stenoses but we don't have recent notes & we will contact his office for the data...  ~  July 02, 2012:  23mo ROV & [name] [name] reports that he is doing well w/o new complaints or  concerns...    He saw [name] 7/13 for f/u asymptomatic carotid dis> CDoppler 6/13 showed some progression of right sided stenosis (now 60-79% but denies cerebral ischemic symptoms (right side remains 40-59%); remains on ASA/Plavix & f/u planned 23mo.    He saw [name] 6/13 for f/u CAD, CABG 2005, HBP, asymptomatic carotid dis> norm LVF, no angina, etc; he stopped the Norvasc due to swelling & substituted HCT 12.5mg /d...    He saw [name] 2/13 for a follow up colonoscopy that was neg- no lesions seen & f/u rec in 92yrs... We reviewed prob list, meds, xrays and labs> see below>> CXR 7/13 showed cardiomeg, s/p CABG, bronchitic changes at the lung bases, NAD... LABS 7/13:  FLP-  Chol ok on Lip10 but TG=164 HDL=27;  Chems- ok x BS=122 A1c=6.4 on diet alone;  CBC- wnl;  TSH=2.69;  PSA=1.40;  VitD=34  ~  January 02, 2013:  23mo ROV & [name] [name] says he is doing well- no new complaints or concerns; We reviewed the following medical problems during today's office visit>>     [name] on Diovan160 & HCT-12.5 (intol to BBlockers in past); BP= 142/80 & he says better at home; denies CP, palpit, SOB, edema,  etc...    CAD> s/p 4 vessel CABG 2005, on ASA/ Plavix; he saw [name] 12/13 for yearly check up- stable & no changes made...    Cerebrovasc Dis> on ASA/ Plavix; followed by [name] & seen 1/14 w/ f/u CDopplers stable w/ 60-79% right ICA stenosis, they are checking Q57mo...    CHOL> on Lip20- taking 1/2 tab daily; last FLP 6/13 showed TChol 127, TG 164, HDL 27, LDL 67; he is intol Niacin & rec to incr exercise program & get wt down...    Overweight> weight= 231# unchanged; we reviewed diet, exercise, & wt loss program...    Hx colon polyps> f/u colonoscopy 2/13 by [name]- neg, no polyps & he rec f/u in 47yrs...    DJD/ Vit D defic> he uses OTC analgesics as needed; continues on Vit D 2000u daily...    Hx TIA> SEE 6/10 Hosp> sm vessel dis, remote lacunar infarcts, intracranial atherosclerotic changes on MRA;  CDopplers followed by [name] as above... We reviewed prob list, meds, xrays and labs> see below for updates >>           Problem List:    HYPERTENSION (ICD-401.9) >> ~  6/09:  prev on Atenolol25mg - stopped due to side effects & feels much better off BBlockers. ~  2/10:  Avapro300 changed to Diovan160 per his insurance company for $$$ reasons. ~  11/11:  BP= 150/80 & remains asymptomatic, doesn't want to incr meds- discussed diet/ exerc... ~  5/12:  BP= 132/70 & denies HA, fatigue, visual changes, CP, palipit, dizziness, syncope, dyspnea, edema, etc... ~  1/13:  BP= 152/68 & he remains asymptomatic; we decided to add NORVASC 5mg /d ==> pt stopped on his own due to swelling. ~  6/13:  He saw DrNishan for f/u & BP= 154/80 on Diovan alone; added HCTZ 12.5mg /d... ~  7/13:  BP= 148/82 here & 130s/70s at home he says on DIOVAN160mg /d & HCTZ 12.5mg /d; continue same ~  CXR 7/13 showed cardiomeg, s/p CABG, bronchitic changes at lung bases, NAD... ~  1/14: on Diovan160 & HCT-12.5 (intol to BBlockers in past); BP= 142/80 & he says better at home; denies CP, palpit, SOB, edema, etc...   CORONARY ARTERY DISEASE (ICD-414.00) - S/P 4 vessel CABG 2/05 by DrVanTrigt> on ASA/ PLAVIX & followed by [name] yearly & his notes are reviewed; he is intol to BBlocker therapy as noted... ~  1/13 & 7/13:  stable & denies angina, palpit, SOB, edema, etc; EKG showed NSR, rate71, WNL, NAD...  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 followed by [name] for VVS. ~  6/10:  See 6/10 Hosp records & f/u by [name] in his office... ~  6/11:  seen by [name] & stable w/o TIAs or amaurosis; CDopplers were stable w/ bilat 40-59% ICA stenoses, f/u 62yr. ~  He was due for a follow up eval 6/12> we don't have records & will call for the report... ~  6/13:  He saw [name] for f/u asymptomatic carotid dis> CDoppler 6/13 showed some progression of right sided stenosis (now 60-79%) but denies  cerebral ischemic symptoms (right side remains 40-59%); continue ASA/Plavix & f/u planned 13mo. ~  1/14:  f/u CDopplers were stable w/ 60-79% right ICA stenosis & 20-39% left ICA stenosis & f/u planned in 13mo...  VENOUS INSUFFICIENCY (ICD-459.81) - he is aware of need to elim salt, elevate legs, wear support hose.  HYPERCHOLESTEROLEMIA (ICD-272.0) - controlled on LIPITOR 20- 1/2 tab daily... he has tried NIACIN in  the past and refuses to take it again due to headaches. ~  FLP 5/08 showed TChol 125, TG 171, HDL 24, LDL 67...  ~  FLP 12/08 showed TChol 107, TG 131, HDL 23, LDL 58... rec- same med, better diet & exercise. ~  FLP 2/10 on Lip10 (wt=235#) showed TChol 119, TG 158, HDL 24, LDL 63... rec- ditto ~  FLP 11/10 on Lip10 (wt=236#) showed TChol 115, TG 128, HDL 28, LDL 62 ~  FLP 5/11 on Lip10 (wt=233#) showed TChol 119, TG 144, HDL 27, LDL 63 ~  DrNishan suggested that he cut the Lipitor to 5mg /d & add Niaspan, but he refuses the Niacin preps due to HAs & wants to contin Lip10. ~  FLP 11/11 on Lip10 (wt=235#) showed TChol 119, TG 113, HDL 31, LDL 66 ~  FLP 5/12 on Lip10 (wt=229#) showed TChol 118, TG 150, HDL 28, LDL 60 ~  FLP 6/13 on Lip10 (wt=232#) showed TChol 127, TG 164, HDL 27, LDL 67... He tells me [name] wanted him in an HDL trial...  DIABETES MELLITUS, BORDERLINE (ICD-790.29) - On diet Rx alone... ~  labs in hosp 6/10 showed BS= 109-113 ~  labs 11/10 showed BS= 109, A1c= 5.9 ~  labs 5/11 showed BS= 107 ~  labs 11/11 showed BS= 108 ~  Labs 5/12 showed BS= 97, A1c= 6.0 ~  Labs 6/13 showed BS=122, A1c= 6.4... Needs better diet & wt reduction.  OVERWEIGHT (ICD-278.02) - he is 6'2" tall and weighs ~230# for a BMI of 30... we have discussed diet & exercise stategies.  COLONIC POLYPS (ICD-211.3) - last colonoscopy 10/07 by [name] showed several 5-6 mm polyps... path= tubular adenomas ... f/u planned 5 years. ~  Colonoscopy 2/13 by [name] was neg- no recurrent polyps etc &  he felt that 57yr f/u was appropriate...  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 Rx w/ PT.  VITAMIN D DEFIC >> on Vit D supplement 2000u daily... ~  Labs 2/10 showed Vit D level = 14... rec to start Vit D supplement OTC... ~  Labs 6/13 showed Vit D level = 34... rec to continue his VitD supplement daily...  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 ~  6/10:  Hosp w/ TIA w/ left face & arm symptoms that resolved quickly; CT showed small vessel disease;  MRI showed bilat remote lacunar infarcts, no acute infarct, +sm vessel dis;  MRA showed intracranial atherosclerotic changes, & ?focal stenosis (?70%) of left ICA in the neck; he saw [name] 06/08/09 (no bruits) w/ CDopplers done showing bilat 40-59% carotid stenoses & agreed w/ ASA + Plavix and f/u 69yr...  ~  6/13: saw [name] for f/u asymptomatic carotid dis> CDoppler 6/13 showed some progression of right sided stenosis (now 60-79%) but denies cerebral ischemic symptoms (right side remains 40-59%); continue ASA/Plavix & f/u planned 39mo. ~  1/14: he continues Q45mo f/u w/ VVS- stable w/o cerebral ischemic symptoms...  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 he had plastic surg by [facility] 5/09 w/ improved scar.  NEURODERMATITIS (ICD-698.3)  HEALTH MAINTENANCE: ~  GI:  followed by [name] & last colonoscopy was 10/07 w/ f/u planned 40yrs. ~  GU:  he is up to date on DRE & PSA checks here (PSA  remains in the 1-2 range). ~  Immunizations:  he had PNEUMOVAX & TETANUS in 2000 w/ repeat PNEUMOVAX 11/10 & TDAP 5/11... he gets the yearly Seasonal Flu vaccine each fall...   Past Surgical History  Procedure Date  . Pilonidial cystectomy 1989  . Keloid surgery on  chest scar 04/2008    Dr. Stephens November  . Coronary artery bypass graft     4 vessel    Outpatient Encounter Prescriptions as of 01/02/2013  Medication Sig Dispense Refill  . aspirin 81 MG tablet Take 81 mg by mouth daily.        [name] atorvastatin (LIPITOR) 20 MG tablet Take 10 mg by mouth daily. Take as directed      . Cholecalciferol (VITAMIN D) 2000 UNITS CAPS Take 1 capsule by mouth daily.        . clopidogrel (PLAVIX) 75 MG tablet TAKE 1 TABLET BY MOUTH EVERY DAY  30 tablet  11  . hydrochlorothiazide (MICROZIDE) 12.5 MG capsule Take 1 capsule (12.5 mg total) by mouth daily.  90 capsule  3  . tadalafil (CIALIS) 20 MG tablet Take as directed  10 tablet  5  . valsartan (DIOVAN) 160 MG tablet Take 1 tablet (160 mg total) by mouth daily.  30 tablet  11    Allergies  Allergen Reactions  . Niacin     REACTION: intol to NIACIN w/ headaches    Current Medications, Allergies, Past Medical History, Past Surgical History, Family History, and Social History were reviewed in [employer] record.    Review of Systems         See HPI - all other systems neg except as noted... The patient denies anorexia, fever, weight loss, weight gain, vision loss, decreased hearing, hoarseness, chest pain, syncope, dyspnea on exertion, peripheral edema, prolonged cough, headaches, hemoptysis, abdominal pain, melena, hematochezia, severe indigestion/heartburn, hematuria, incontinence, muscle weakness, suspicious skin lesions, transient blindness, difficulty walking, depression, unusual weight change, abnormal bleeding, enlarged lymph nodes, and angioedema.     Objective:   Physical Exam    WD, Overweight, 72 y/o WM in NAD... Vital Signs:  Reviewed> GENERAL:  Alert & oriented; pleasant & cooperative. HEENT:  [place]/AT, EOM-wnl, PERRLA, Fundi-benign, EACs-clear, TMs-wnl, NOSE-clear, THROAT-clear & wnl. NECK:  Supple w/ fairROM; no JVD; normal carotid impulses w/o bruits; no thyromegaly or nodules  palpated; no lymphadenopathy. CHEST:  Clear to P & A; without wheezes/ rales/ or rhonchi. HEART:  Median sternotomy scar w/ improved keloid, regular rhythm; without murmurs/ rubs/ or gallops. ABDOMEN:  Soft & nontender; normal bowel sounds; no organomegaly or masses detected. 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 EXT: without deformities, mild arthritic changes; + venous insuffic changes, no edema. NEURO:  CN's intact; motor testing normal; sensory testing normal; gait normal & balance OK. DERM:  Keloid as noted, no other lesions...  RADIOLOGY DATA:  Reviewed in the EPIC EMR & discussed w/ the patient...  LABORATORY DATA:  Reviewed in the EPIC EMR & discussed w/ the patient...   Assessment & Plan:    [name]  On Diovan160 & HCT 12.5 w/ improved BP; needs to lose wt & elim sodium...  CEREBROVASC DIS/ TIA>  No further cerebral ischemic symptoms on the ASA/ Plavix; f/u CDoppler w/ progression on the right & [name] plans 31mo ROV.  CHOL>  On Lip10 & stable; needs better low fat diet & get wt down; DrNishan wanted in in a drug study...  DM> adeq control w/ diet alone but he  understands the importance of wt reduction.  GI>  He had colonoscopy f/u 2/13 & it was neg...  Other medical problems as noted...   Patient's Medications  New Prescriptions   No medications on file  Previous Medications   ASPIRIN 81 MG TABLET    Take 81 mg by mouth daily.     ATORVASTATIN (LIPITOR) 20 MG TABLET    Take 10 mg by mouth daily. Take as directed   CHOLECALCIFEROL (VITAMIN D) 2000 UNITS CAPS    Take 1 capsule by mouth daily.     CLOPIDOGREL (PLAVIX) 75 MG TABLET    TAKE 1 TABLET BY MOUTH EVERY DAY   HYDROCHLOROTHIAZIDE (MICROZIDE) 12.5 MG CAPSULE    Take 1 capsule (12.5 mg total) by mouth daily.   TADALAFIL (CIALIS) 20 MG TABLET    Take as directed   VALSARTAN (DIOVAN) 160 MG TABLET    Take 1 tablet (160 mg total) by mouth daily.  Modified Medications   No medications on file  Discontinued  Medications   No medications on file

## 2013-01-02 NOTE — Patient Instructions (Addendum)
Today we updated your med list in our EPIC system...    Continue your current medications the same...  Call for any problems...  Let's plan a follow up visit in 6 months w/ FASTING blood work at that time... 

## 2013-01-03 ENCOUNTER — Encounter: Payer: Self-pay | Admitting: Pulmonary Disease

## 2013-06-08 ENCOUNTER — Other Ambulatory Visit: Payer: Self-pay | Admitting: Pulmonary Disease

## 2013-06-10 ENCOUNTER — Other Ambulatory Visit: Payer: Self-pay | Admitting: *Deleted

## 2013-06-10 DIAGNOSIS — I10 Essential (primary) hypertension: Secondary | ICD-10-CM

## 2013-06-10 DIAGNOSIS — I2510 Atherosclerotic heart disease of native coronary artery without angina pectoris: Secondary | ICD-10-CM

## 2013-06-10 MED ORDER — HYDROCHLOROTHIAZIDE 12.5 MG PO CAPS: 12.5000 mg | ORAL_CAPSULE | Freq: Every day | ORAL | Status: DC

## 2013-06-10 NOTE — Telephone Encounter (Signed)
Fax Received. Refill Completed. [name] [name] (R.M.A)   

## 2013-06-23 ENCOUNTER — Telehealth: Payer: Self-pay | Admitting: Pulmonary Disease

## 2013-06-23 DIAGNOSIS — D126 Benign neoplasm of colon, unspecified: Secondary | ICD-10-CM

## 2013-06-23 DIAGNOSIS — R0600 Dyspnea, unspecified: Secondary | ICD-10-CM

## 2013-06-23 DIAGNOSIS — F419 Anxiety disorder, unspecified: Secondary | ICD-10-CM

## 2013-06-23 DIAGNOSIS — Z125 Encounter for screening for malignant neoplasm of prostate: Secondary | ICD-10-CM

## 2013-06-23 DIAGNOSIS — R7309 Other abnormal glucose: Secondary | ICD-10-CM

## 2013-06-23 DIAGNOSIS — I10 Essential (primary) hypertension: Secondary | ICD-10-CM

## 2013-06-23 DIAGNOSIS — E7800 Pure hypercholesterolemia, unspecified: Secondary | ICD-10-CM

## 2013-06-23 NOTE — Telephone Encounter (Signed)
Called and spoke with pt and he is aware of labs placed in the computer for him.  Nothing further is needed.

## 2013-06-23 NOTE — Telephone Encounter (Signed)
Last OV 01/02/13 Pending 07/02/13 for 6 month OV.  Please advise SN thanks

## 2013-06-26 ENCOUNTER — Other Ambulatory Visit (INDEPENDENT_AMBULATORY_CARE_PROVIDER_SITE_OTHER): Payer: Medicare Other

## 2013-06-26 DIAGNOSIS — R0600 Dyspnea, unspecified: Secondary | ICD-10-CM

## 2013-06-26 DIAGNOSIS — D126 Benign neoplasm of colon, unspecified: Secondary | ICD-10-CM

## 2013-06-26 DIAGNOSIS — Z125 Encounter for screening for malignant neoplasm of prostate: Secondary | ICD-10-CM

## 2013-06-26 DIAGNOSIS — F411 Generalized anxiety disorder: Secondary | ICD-10-CM

## 2013-06-26 DIAGNOSIS — R7309 Other abnormal glucose: Secondary | ICD-10-CM

## 2013-06-26 DIAGNOSIS — E7800 Pure hypercholesterolemia, unspecified: Secondary | ICD-10-CM

## 2013-06-26 DIAGNOSIS — F419 Anxiety disorder, unspecified: Secondary | ICD-10-CM

## 2013-06-26 DIAGNOSIS — I10 Essential (primary) hypertension: Secondary | ICD-10-CM

## 2013-06-26 LAB — BASIC METABOLIC PANEL
BUN: 17 mg/dL (ref 6–23)
CO2: 31 mEq/L (ref 19–32)
Calcium: 9.4 mg/dL (ref 8.4–10.5)
Chloride: 100 mEq/L (ref 96–112)
Creatinine, Ser: 1.1 mg/dL (ref 0.4–1.5)
GFR: 74.3 mL/min (ref >60.00)
Glucose, Bld: 136 mg/dL — ABNORMAL HIGH (ref 70–99)
Potassium: 4.4 mEq/L (ref 3.5–5.1)
Sodium: 138 mEq/L (ref 135–145)

## 2013-06-26 LAB — HEPATIC FUNCTION PANEL
ALT: 35 U/L (ref 0–53)
AST: 32 U/L (ref 0–37)
Albumin: 4 g/dL (ref 3.5–5.2)
Alkaline Phosphatase: 70 U/L (ref 39–117)
Bilirubin, Direct: 0.2 mg/dL (ref 0.0–0.3)
Total Bilirubin: 1 mg/dL (ref 0.3–1.2)
Total Protein: 7.2 g/dL (ref 6.0–8.3)

## 2013-06-26 LAB — CBC WITH DIFFERENTIAL/PLATELET
Basophils Absolute: 0 10*3/uL (ref 0.0–0.1)
Basophils Relative: 0.3 % (ref 0.0–3.0)
Eosinophils Absolute: 0.1 10*3/uL (ref 0.0–0.7)
Eosinophils Relative: 2.1 % (ref 0.0–5.0)
HCT: 48.7 % (ref 39.0–52.0)
Hemoglobin: 16.7 g/dL (ref 13.0–17.0)
Lymphocytes Relative: 22 % (ref 12.0–46.0)
Lymphs Abs: 1.5 10*3/uL (ref 0.7–4.0)
MCHC: 34.3 g/dL (ref 30.0–36.0)
MCV: 101.8 fl — ABNORMAL HIGH (ref 78.0–100.0)
Monocytes Absolute: 0.7 10*3/uL (ref 0.1–1.0)
Monocytes Relative: 10.5 % (ref 3.0–12.0)
Neutro Abs: 4.4 10*3/uL (ref 1.4–7.7)
Neutrophils Relative %: 65.1 % (ref 43.0–77.0)
Platelets: 191 10*3/uL (ref 150.0–400.0)
RBC: 4.79 Mil/uL (ref 4.22–5.81)
RDW: 13.6 % (ref 11.5–14.6)
WBC: 6.8 10*3/uL (ref 4.5–10.5)

## 2013-06-26 LAB — LIPID PANEL
Cholesterol: 126 mg/dL (ref 0–200)
HDL: 32 mg/dL — ABNORMAL LOW (ref >39.00)
LDL Cholesterol: 63 mg/dL (ref 0–99)
Total CHOL/HDL Ratio: 4
Triglycerides: 155 mg/dL — ABNORMAL HIGH (ref 0.0–149.0)
VLDL: 31 mg/dL (ref 0.0–40.0)

## 2013-06-26 LAB — PSA: PSA: 1.65 ng/mL (ref 0.10–4.00)

## 2013-06-26 LAB — C-REACTIVE PROTEIN: CRP: <0.5 mg/dL (ref 0.5–20.0)

## 2013-06-26 LAB — TSH: TSH: 2.66 u[IU]/mL (ref 0.35–5.50)

## 2013-06-26 LAB — HEMOGLOBIN A1C: Hgb A1c MFr Bld: 6.4 % (ref 4.6–6.5)

## 2013-06-30 ENCOUNTER — Ambulatory Visit: Payer: Medicare Other | Admitting: Neurosurgery

## 2013-06-30 ENCOUNTER — Other Ambulatory Visit (INDEPENDENT_AMBULATORY_CARE_PROVIDER_SITE_OTHER): Payer: Medicare Other | Admitting: *Deleted

## 2013-06-30 DIAGNOSIS — I6529 Occlusion and stenosis of unspecified carotid artery: Secondary | ICD-10-CM

## 2013-07-02 ENCOUNTER — Other Ambulatory Visit: Payer: Self-pay | Admitting: *Deleted

## 2013-07-02 ENCOUNTER — Encounter: Payer: Self-pay | Admitting: Pulmonary Disease

## 2013-07-02 ENCOUNTER — Ambulatory Visit (INDEPENDENT_AMBULATORY_CARE_PROVIDER_SITE_OTHER): Payer: Medicare Other | Admitting: Pulmonary Disease

## 2013-07-02 VITALS — BP 140/70 | HR 84 | Temp 97.2°F | Ht 74.0 in | Wt 231.8 lb

## 2013-07-02 DIAGNOSIS — R7309 Other abnormal glucose: Secondary | ICD-10-CM

## 2013-07-02 DIAGNOSIS — I2510 Atherosclerotic heart disease of native coronary artery without angina pectoris: Secondary | ICD-10-CM

## 2013-07-02 DIAGNOSIS — I872 Venous insufficiency (chronic) (peripheral): Secondary | ICD-10-CM

## 2013-07-02 DIAGNOSIS — G459 Transient cerebral ischemic attack, unspecified: Secondary | ICD-10-CM

## 2013-07-02 DIAGNOSIS — L910 Hypertrophic scar: Secondary | ICD-10-CM

## 2013-07-02 DIAGNOSIS — Z860100 Personal history of colon polyps, unspecified: Secondary | ICD-10-CM

## 2013-07-02 DIAGNOSIS — E663 Overweight: Secondary | ICD-10-CM

## 2013-07-02 DIAGNOSIS — I10 Essential (primary) hypertension: Secondary | ICD-10-CM

## 2013-07-02 DIAGNOSIS — E7800 Pure hypercholesterolemia, unspecified: Secondary | ICD-10-CM

## 2013-07-02 DIAGNOSIS — I6529 Occlusion and stenosis of unspecified carotid artery: Secondary | ICD-10-CM

## 2013-07-02 MED ORDER — SILDENAFIL CITRATE 20 MG PO TABS: ORAL_TABLET | ORAL | Status: DC

## 2013-07-02 MED ORDER — ATORVASTATIN CALCIUM 20 MG PO TABS: 10.0000 mg | ORAL_TABLET | Freq: Every day | ORAL | Status: DC

## 2013-07-02 NOTE — Patient Instructions (Addendum)
Today we updated your med list in our EPIC system...    Continue your current medications the same...  We refilled your meds as requested...  We reviewed your recent labs & gave you a copy for your records...  Let's get on track w/ our diet (low carb, low fat) & exercise progam...    The goal is to lose 15-20 lbs...  Call for any questions...  Let's plan a follow up visit in 58mo, sooner if needed for problems.Marland KitchenMarland Kitchen

## 2013-07-02 NOTE — Progress Notes (Signed)
Subjective:    Patient ID: [name] [name], male    DOB: 06/18/1941, 72 y.o.   MRN: [id number]  HPI 72 y/o WM here for a follow up visit... he has multiple medical problems as noted below...  SEE PREV EPIC NOTES FOR EARLIER DATA>>  ~  January 03, 2012:  7-77mo ROV     [name] on Diovan160 & intol to BBlockers in past; BP= 152/68 but he says better at home; we discussed the need for tighter control & rec adding NORVASC 5mg /d...    CAD> s/p 4 vessel CABG 2005, on ASA/ Plavix; he saw [name] [name] 6/12 for yearly check up- stable & no changes made...    Cerebrovasc Dis> on ASA/ Plavix; followed by [name] [name] but the last note & CDoppler we have is 6/10==> we will contact his office for f/u notes & Duplex reports...    CHOL> on Lip20- taking 1/2 tab daily; last FLP 5/12 looked good x low HDL=28; he is intol Niacin & rec to incr exercise program which he has yet to do...    Overweight> weight= 234# which is up 5# despite mult attempts at diet, exercise, wt loss program...    Hx colon polyps> last colon 10/07 by [name] [name] & follow up due 10/12; we will send referral to his office...    DJD/ Vit D defic> he uses OTC analgesics as needed; continues on Vit D 2000u daily...    Hx TIA> SEE 6/10 Hosp> sm vessel dis, remote lacunar infarcts, intracranial atherosclerotic changes on MRA; CDopplers followed by [name] [name] w/ 40-59% bilat ICA stenoses but we don't have recent notes & we will contact his office for the data...  ~  July 02, 2012:  24mo ROV & [name] [name] reports that he is doing well w/o new complaints or concerns...    He saw [name] [name] 7/13 for f/u asymptomatic carotid dis> CDoppler 6/13 showed some progression of right sided stenosis (now 60-79% but denies cerebral ischemic symptoms (right side remains 40-59%); remains on ASA/Plavix & f/u planned 24mo.    He saw [name] [name] 6/13 for f/u CAD, CABG 2005, HBP, asymptomatic carotid dis> norm LVF, no angina, etc; he stopped the Norvasc due to swelling & substituted HCT  12.5mg /d...    He saw [name] [name] 2/13 for a follow up colonoscopy that was neg- no lesions seen & f/u rec in 20yrs... We reviewed prob list, meds, xrays and labs> see below>> CXR 7/13 showed cardiomeg, s/p CABG, bronchitic changes at the lung bases, NAD... LABS 7/13:  FLP-  Chol ok on Lip10 but TG=164 HDL=27;  Chems- ok x BS=122 A1c=6.4 on diet alone;  CBC- wnl;  TSH=2.69;  PSA=1.40;  VitD=34  ~  January 02, 2013:  24mo ROV & [name] [name] says he is doing well- no new complaints or concerns; We reviewed the following medical problems during today's office visit>>     [name] on Diovan160 & HCT-12.5 (intol to BBlockers in past); BP= 142/80 & he says better at home; denies CP, palpit, SOB, edema, etc...    CAD> s/p 4 vessel CABG 2005, on ASA/ Plavix; he saw [name] [name] 12/13 for yearly check up- stable & no changes made...    Cerebrovasc Dis> on ASA/ Plavix; followed by [name] [name] & seen 1/14 w/ f/u CDopplers stable w/ 60-79% right ICA stenosis, they are checking Q60mo...    CHOL> on Lip20- taking 1/2 tab daily; last FLP 6/13 showed TChol 127, TG 164, HDL 27, LDL 67; he is intol Niacin & rec to incr exercise program & get  wt down...    Overweight> weight= 231# unchanged; we reviewed diet, exercise, & wt loss program...    Hx colon polyps> f/u colonoscopy 2/13 by [name] [name]- neg, no polyps & he rec f/u in 83yrs...    DJD/ Vit D defic> he uses OTC analgesics as needed; continues on Vit D 2000u daily...    Hx TIA> SEE 6/10 Hosp> sm vessel dis, remote lacunar infarcts, intracranial atherosclerotic changes on MRA; CDopplers followed by [name] [name] as above... We reviewed prob list, meds, xrays and labs> see below for updates >>   ~  July 02, 2013:  6mop ROV & [name] [name] mentions "2 things"> 1) notes occas fecal urgency- it happened x2, just HAD to go, no leakage, and asked to incr fiber, metamucil, probiotics, etc... 2) BP ave 125-135/ 60-70 at home; measures 140/70 today; he thinks side effect from Diovan- specifically he stumbles  if he changes directions while walking; discussed w/ pt & asked to decr the Diovan160 to 1/2 tab first & report how he's doing before we consider stopping this med...     [name] on Diovan160 & HCT-12.5 (intol to BBlockers in past); BP= 140/70 & he says better at home; denies CP, palpit, SOB, edema, etc...    CAD> s/p 4 vessel CABG 2005, on ASA/ Plavix; he saw [name]/13 for yearly check up- stable & no changes made...    Cerebrovasc Dis> on ASA/ Plavix; followed by [name] [name] & seen 1/14 w/ f/u CDopplers stable w/ 60-79% right ICA stenosis, they are checking Q42mo...    CHOL> on Lip20- taking 1/2 tab daily; FLP 6/14 shows TChol 126, TG 155, HDL 32, LDL 63; he is intol Niacin & rec to incr exercise program & get wt down...    Overweight> weight= 232# unchanged; we reviewed diet, exercise, & wt loss program...    Hx colon polyps> f/u colonoscopy 2/13 by [name] [name]- neg, no polyps & he rec f/u in 63yrs...    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    DJD/ Vit D defic> he uses OTC analgesics as needed; continues on Vit D 2000u daily w/ last VitD level 6/13= 34...    Hx TIA> SEE 6/10 Hosp> sm vessel dis, remote lacunar infarcts, intracranial atherosclerotic changes on MRA; CDopplers followed by [name] [name] as above w/ 60-79% RICAstenosis...    Keloid> in lower sternal scar w/ plastic repair in past... We reviewed prob list, meds, xrays and labs> see below for updates >>  LABS 6/14:  FLP- on Lip10 w/ TChol/LDL ok but TG/HDL still off;  Chems- ok x BS=136, A1c=6.4;  CBC- wnl;  TSH=2.66;  PSA=1.65;  CRP done at pt request=0.5.[name]..           Problem List:    HYPERTENSION (ICD-401.9) >> ~  6/09:  prev on Atenolol25mg - stopped due to side effects & feels much better off BBlockers. ~  2/10:  Avapro300 changed to Diovan160 per his insurance company for $$$ reasons. ~  11/11:  BP= 150/80 & remains asymptomatic, doesn't want to incr meds- discussed diet/ exerc... ~  5/12:  BP= 132/70 & denies HA, fatigue, visual  changes, CP, palipit, dizziness, syncope, dyspnea, edema, etc... ~  1/13:  BP= 152/68 & he remains asymptomatic; we decided to add NORVASC 5mg /d ==> pt stopped on his own due to swelling. ~  6/13:  He saw DrNishan for f/u & BP= 154/80 on Diovan alone; added HCTZ 12.5mg /d... ~  7/13:  BP= 148/82 here & 130s/70s at home he says on DIOVAN160mg /d &  HCTZ 12.5mg /d; continue same ~  CXR 7/13 showed cardiomeg, s/p CABG, bronchitic changes at lung bases, NAD... ~  1/14: on Diovan160 & HCT-12.5 (intol to BBlockers in past); BP= 142/80 & he says better at home; denies CP, palpit, SOB, edema, etc...  ~  7/14: on Diovan160 & HCT-12.5 (intol to BBlockers in past); BP= 140/70 & he remains asymptomatic...  CORONARY ARTERY DISEASE (ICD-414.00) - S/P 4 vessel CABG 2/05 by DrVanTrigt> on ASA/ PLAVIX & followed by [name] [name] yearly & his notes are reviewed; he is intol to BBlocker therapy as noted... ~  1/13 & 7/13:  stable & denies angina, palpit, SOB, edema, etc; EKG showed NSR, rate71, WNL, NAD...  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 followed by Lyla Son for [facility]. ~  6/10:  See 6/10 Hosp records & f/u by [name] [name] in his office... ~  6/11:  seen by [name] [name] & stable w/o TIAs or amaurosis; CDopplers were stable w/ bilat 40-59% ICA stenoses, f/u 25yr. ~  He was due for a follow up eval 6/12> we don't have records & will call for the report... ~  6/13:  He saw [name] [name] for f/u asymptomatic carotid dis> CDoppler 6/13 showed some progression of right sided stenosis (now 60-79%) but denies cerebral ischemic symptoms (right side remains 40-59%); continue ASA/Plavix & f/u planned 11mo. ~  1/14:  f/u CDopplers were stable w/ 60-79% right ICA stenosis & 20-39% left ICA stenosis & f/u planned in 11mo...  VENOUS INSUFFICIENCY (ICD-459.81) - he is aware of need to elim salt, elevate legs, wear support hose.  HYPERCHOLESTEROLEMIA (ICD-272.0) - controlled on LIPITOR 20- 1/2 tab daily... he  has tried NIACIN in the past and refuses to take it again due to headaches. ~  FLP 5/08 showed TChol 125, TG 171, HDL 24, LDL 67...  ~  FLP 12/08 showed TChol 107, TG 131, HDL 23, LDL 58... rec- same med, better diet & exercise. ~  FLP 2/10 on Lip10 (wt=235#) showed TChol 119, TG 158, HDL 24, LDL 63... rec- ditto ~  FLP 11/10 on Lip10 (wt=236#) showed TChol 115, TG 128, HDL 28, LDL 62 ~  FLP 5/11 on Lip10 (wt=233#) showed TChol 119, TG 144, HDL 27, LDL 63 ~  DrNishan suggested that he cut the Lipitor to 5mg /d & add Niaspan, but he refuses the Niacin preps due to HAs & wants to contin Lip10. ~  FLP 11/11 on Lip10 (wt=235#) showed TChol 119, TG 113, HDL 31, LDL 66 ~  FLP 5/12 on Lip10 (wt=229#) showed TChol 118, TG 150, HDL 28, LDL 60 ~  FLP 6/13 on Lip10 (wt=232#) showed TChol 127, TG 164, HDL 27, LDL 67... He tells me [name] [name] wanted him in an HDL trial... ~  6/14: on Lip20- taking 1/2 tab daily; FLP 6/14 shows TChol 126, TG 155, HDL 32, LDL 63; he is intol Niacin & rec to incr exercise program & get wt down  DIABETES MELLITUS, BORDERLINE (ICD-790.29) - On diet Rx alone... ~  labs in hosp 6/10 showed BS= 109-113 ~  labs 11/10 showed BS= 109, A1c= 5.9 ~  labs 5/11 showed BS= 107 ~  labs 11/11 showed BS= 108 ~  Labs 5/12 showed BS= 97, A1c= 6.0 ~  Labs 6/13 showed BS=122, A1c= 6.4... Needs better diet & wt reduction. ~  Labs 6/14 showed BS= 136, A1c= 6.4  OVERWEIGHT (ICD-278.02) - he is 6'2" tall and weighs ~230# for a BMI of 30... we have discussed diet &  exercise stategies.  COLONIC POLYPS (ICD-211.3) - last colonoscopy 10/07 by [name] [name] showed several 5-6 mm polyps... path= tubular adenomas ... f/u planned 5 years. ~  Colonoscopy 2/13 by [name] [name] was neg- no recurrent polyps etc & he felt that 49yr f/u was appropriate...  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 Rx w/ PT.  VITAMIN D DEFIC >> on Vit D supplement 2000u daily... ~  Labs 2/10 showed Vit D level = 14... rec to start Vit D supplement OTC... ~  Labs 6/13 showed Vit D level = 34... rec to continue his VitD supplement daily...  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 ~  6/10:  Hosp w/ TIA w/ left face & arm symptoms that resolved quickly; CT showed small vessel disease;  MRI showed bilat remote lacunar infarcts, no acute infarct, +sm vessel dis;  MRA showed intracranial atherosclerotic changes, & ?focal stenosis (?70%) of left ICA in the neck; he saw [name] [name] 06/08/09 (no bruits) w/ CDopplers done showing bilat 40-59% carotid stenoses & agreed w/ ASA + Plavix and f/u 71yr...  ~  6/13: saw [name] [name] for f/u asymptomatic carotid dis> CDoppler 6/13 showed some progression of right sided stenosis (now 60-79%) but denies cerebral ischemic symptoms (right side remains 40-59%); continue ASA/Plavix & f/u planned 63mo. ~  1/14: he continues Q48mo f/u w/ [facility]- stable w/o cerebral ischemic symptoms...  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 he had plastic surg by [facility] 5/09 w/ improved scar.  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  HEALTH MAINTENANCE: ~  GI:  followed by [name] [name] & last colonoscopy was 10/07 w/ f/u planned 9yrs. ~  GU:  he is up to date on DRE & PSA checks here (PSA remains in the 1-2 range). ~  Immunizations:  he had PNEUMOVAX & TETANUS in 2000 w/ repeat PNEUMOVAX 11/10 & TDAP 5/11... he gets the yearly Seasonal Flu vaccine each fall...   Past Surgical History  Procedure Laterality Date  . Pilonidial cystectomy  1989  . Keloid surgery on chest scar  04/2008    Dr. Stephens November  . Coronary artery bypass graft      4 vessel    Outpatient Encounter Prescriptions as of  07/02/2013  Medication Sig Dispense Refill  . aspirin 81 MG tablet Take 81 mg by mouth daily.        [name] atorvastatin (LIPITOR) 20 MG tablet Take 10 mg by mouth daily. Take as directed      . Cholecalciferol (VITAMIN D) 2000 UNITS CAPS Take 1 capsule by mouth daily.        . clopidogrel (PLAVIX) 75 MG tablet TAKE 1 TABLET BY MOUTH EVERY DAY  30 tablet  11  . hydrochlorothiazide (MICROZIDE) 12.5 MG capsule Take 1 capsule (12.5 mg total) by mouth daily.  90 capsule  3  . tadalafil (CIALIS) 20 MG tablet Take as directed  10 tablet  5  . valsartan (DIOVAN) 160 MG tablet Take 1 tablet (160 mg total) by mouth daily.  30 tablet  11   No facility-administered encounter medications on file as of 07/02/2013.    Allergies  Allergen Reactions  . Niacin     REACTION: intol to NIACIN w/ headaches  Current Medications, Allergies, Past Medical History, Past Surgical History, Family History, and Social History were reviewed in [employer] record.    Review of Systems         See HPI - all other systems neg except as noted... The patient denies anorexia, fever, weight loss, weight gain, vision loss, decreased hearing, hoarseness, chest pain, syncope, dyspnea on exertion, peripheral edema, prolonged cough, headaches, hemoptysis, abdominal pain, melena, hematochezia, severe indigestion/heartburn, hematuria, incontinence, muscle weakness, suspicious skin lesions, transient blindness, difficulty walking, depression, unusual weight change, abnormal bleeding, enlarged lymph nodes, and angioedema.     Objective:   Physical Exam    WD, Overweight, 72 y/o WM in NAD... Vital Signs:  Reviewed> GENERAL:  Alert & oriented; pleasant & cooperative. HEENT:  [place]/AT, EOM-wnl, PERRLA, Fundi-benign, EACs-clear, TMs-wnl, NOSE-clear, THROAT-clear & wnl. NECK:  Supple w/ fairROM; no JVD; normal carotid impulses w/o bruits; no thyromegaly or nodules palpated; no lymphadenopathy. CHEST:  Clear to P & A;  without wheezes/ rales/ or rhonchi. HEART:  Median sternotomy scar w/ improved keloid, regular rhythm; without murmurs/ rubs/ or gallops. ABDOMEN:  Soft & nontender; normal bowel sounds; no organomegaly or masses detected. 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 EXT: without deformities, mild arthritic changes; + venous insuffic changes, no edema. NEURO:  CN's intact; motor testing normal; sensory testing normal; gait normal & balance OK. DERM:  Keloid as noted, no other lesions...  RADIOLOGY DATA:  Reviewed in the EPIC EMR & discussed w/ the patient...  LABORATORY DATA:  Reviewed in the EPIC EMR & discussed w/ the patient...   Assessment & Plan:    [name]  On Diovan160 & HCT 12.5 w/ improved BP; needs to lose wt & elim sodium...  CEREBROVASC DIS/ TIA>  No further cerebral ischemic symptoms on the ASA/ Plavix; f/u CDoppler w/ 60-79% RICAstenosis & [name] [name] plans 52mo ROV.  CHOL>  On Lip10 & stable; needs better low fat diet & get wt down; DrNishan wanted in in a drug study...  DM> adeq control w/ diet alone but he understands the importance of wt reduction.  GI>  He had colonoscopy f/u 2/13 & it was neg...  Other medical problems as noted...   Patient's Medications  New Prescriptions   SILDENAFIL (REVATIO) 20 MG TABLET    2-5 tablets as needed for sexual activity  Previous Medications   ASPIRIN 81 MG TABLET    Take 81 mg by mouth daily.     CHOLECALCIFEROL (VITAMIN D) 2000 UNITS CAPS    Take 1 capsule by mouth daily.     CLOPIDOGREL (PLAVIX) 75 MG TABLET    TAKE 1 TABLET BY MOUTH EVERY DAY   HYDROCHLOROTHIAZIDE (MICROZIDE) 12.5 MG CAPSULE    Take 1 capsule (12.5 mg total) by mouth daily.   TADALAFIL (CIALIS) 20 MG TABLET    Take as directed   VALSARTAN (DIOVAN) 160 MG TABLET    Take 1 tablet (160 mg total) by mouth daily.  Modified Medications   Modified Medication Previous Medication   ATORVASTATIN (LIPITOR) 20 MG TABLET atorvastatin (LIPITOR) 20 MG tablet      Take  0.5 tablets (10 mg total) by mouth daily. Take as directed    Take 10 mg by mouth daily. Take as directed  Discontinued Medications   No medications on file

## 2013-07-06 ENCOUNTER — Encounter: Payer: Self-pay | Admitting: Vascular Surgery

## 2013-08-03 ENCOUNTER — Ambulatory Visit (INDEPENDENT_AMBULATORY_CARE_PROVIDER_SITE_OTHER): Payer: Medicare Other | Admitting: Cardiovascular Disease

## 2013-08-03 ENCOUNTER — Encounter: Payer: Self-pay | Admitting: Cardiovascular Disease

## 2013-08-03 VITALS — BP 136/70 | HR 77 | Ht 74.0 in | Wt 231.0 lb

## 2013-08-03 DIAGNOSIS — R7309 Other abnormal glucose: Secondary | ICD-10-CM

## 2013-08-03 DIAGNOSIS — I872 Venous insufficiency (chronic) (peripheral): Secondary | ICD-10-CM

## 2013-08-03 DIAGNOSIS — I2510 Atherosclerotic heart disease of native coronary artery without angina pectoris: Secondary | ICD-10-CM

## 2013-08-03 DIAGNOSIS — I10 Essential (primary) hypertension: Secondary | ICD-10-CM

## 2013-08-03 MED ORDER — NITROGLYCERIN 0.4 MG SL SUBL: 0.4000 mg | SUBLINGUAL_TABLET | SUBLINGUAL | Status: DC | PRN

## 2013-08-03 NOTE — Patient Instructions (Addendum)
Your physician has recommended you make the following change in your medication: start using Nitroglycerin for your chest pain. Please follow instructions on bottle.  Your physician wants you to follow-up in: 1 year. You will receive a reminder letter in the mail two months in advance. If you don't receive a letter, please call our office to schedule the follow-up appointment.

## 2013-08-03 NOTE — Assessment & Plan Note (Signed)
ASA  F/U [facility]  Known moderate RICA stenosis stable Duplex 1/15

## 2013-08-03 NOTE — Assessment & Plan Note (Signed)
Well controlled.  Continue current medications and low sodium Dash type diet.    

## 2013-08-03 NOTE — Progress Notes (Signed)
Patient ID: DOV DILL, male   DOB: 1941-11-06, 72 y.o.   MRN: [id number] [name] [name] seen today in followup. His known coronary disease with previous coronary bypass surgery in 2005. he has normal LV function. His risk factors are well modified Not having significant chest pain PND orthopnea is really active. He has 3 young grandchildren and is quite busy with them. He sees Dr. Kriste Basque for his primary care needs. He had a TIA 2010 with mild facial droop for about 3 minutes. W/U negative. On Plavix. BP a little high. Norvasc caused swelling of feet and stopped Last carotid 7/14  at [facility] with Dr Early showed 60-79% RICA and 40-59% LICA. Needs diovan refilled  Needs nitro Discussed weight loss and low carb diet  Can go to [facility] to work out  ROS: Denies fever, malais, weight loss, blurry vision, decreased visual acuity, cough, sputum, SOB, hemoptysis, pleuritic pain, palpitaitons, heartburn, abdominal pain, melena, lower extremity edema, claudication, or rash.  All other systems reviewed and negative  General: Affect appropriate Healthy:  appears stated age HEENT: normal Neck supple with no adenopathy JVP normal no bruits no thyromegaly Lungs clear with no wheezing and good diaphragmatic motion Heart:  S1/S2 no murmur, no rub, gallop or click PMI normal Abdomen: benighn, BS positve, no tenderness, no AAA no bruit.  No HSM or HJR Distal pulses intact with no bruits No edema Neuro non-focal Skin warm and dry No muscular weakness   Current Outpatient Prescriptions  Medication Sig Dispense Refill  . aspirin 81 MG tablet Take 81 mg by mouth daily.        [name] atorvastatin (LIPITOR) 20 MG tablet Take 0.5 tablets (10 mg total) by mouth daily. Take as directed  30 tablet  11  . Cholecalciferol (VITAMIN D) 2000 UNITS CAPS Take 1 capsule by mouth daily.        . clopidogrel (PLAVIX) 75 MG tablet TAKE 1 TABLET BY MOUTH EVERY DAY  30 tablet  11  . hydrochlorothiazide (MICROZIDE) 12.5 MG capsule Take  1 capsule (12.5 mg total) by mouth daily.  90 capsule  3  . sildenafil (REVATIO) 20 MG tablet 2-5 tablets as needed for sexual activity  50 tablet  5  . tadalafil (CIALIS) 20 MG tablet Take as directed  10 tablet  5  . valsartan (DIOVAN) 160 MG tablet Take 1 tablet (160 mg total) by mouth daily.  30 tablet  11   No current facility-administered medications for this visit.    Allergies  Niacin  Electrocardiogram:  SR rate 77 Q lead 3 nonspecific lateral T wave changes   Assessment and Plan

## 2013-08-03 NOTE — Assessment & Plan Note (Signed)
Discussed low carb diet.  Target hemoglobin A1c is 6.5 or less.  Continue current medications.  

## 2013-08-03 NOTE — Assessment & Plan Note (Signed)
Stable with no angina and good activity level.  Continue medical Rx Nitro called in 

## 2013-09-10 ENCOUNTER — Other Ambulatory Visit: Payer: Self-pay | Admitting: Pulmonary Disease

## 2013-12-22 ENCOUNTER — Other Ambulatory Visit: Payer: Self-pay | Admitting: Cardiovascular Disease

## 2013-12-23 ENCOUNTER — Telehealth: Payer: Self-pay | Admitting: Pulmonary Disease

## 2013-12-23 NOTE — Telephone Encounter (Signed)
Can use appt spot 1/23 at 9am.  thanks

## 2013-12-23 NOTE — Telephone Encounter (Signed)
Please advise on possible work in appt. Carron Curie, CMA

## 2013-12-23 NOTE — Telephone Encounter (Signed)
Spoke with pt and appt scheduled. Nothing further needed 

## 2014-01-04 ENCOUNTER — Encounter: Payer: Self-pay | Admitting: Family

## 2014-01-05 ENCOUNTER — Ambulatory Visit (HOSPITAL_COMMUNITY)
Admission: RE | Admit: 2014-01-05 | Discharge: 2014-01-05 | Disposition: A | Discharge status: Home / Self Care | Payer: Medicare Other | Source: Ambulatory Visit | Attending: Family | Admitting: Family

## 2014-01-05 ENCOUNTER — Ambulatory Visit (INDEPENDENT_AMBULATORY_CARE_PROVIDER_SITE_OTHER): Payer: Medicare Other | Admitting: Family

## 2014-01-05 ENCOUNTER — Encounter: Payer: Self-pay | Admitting: Family

## 2014-01-05 DIAGNOSIS — I6529 Occlusion and stenosis of unspecified carotid artery: Secondary | ICD-10-CM

## 2014-01-05 DIAGNOSIS — I658 Occlusion and stenosis of other precerebral arteries: Secondary | ICD-10-CM | POA: Insufficient documentation

## 2014-01-05 NOTE — Patient Instructions (Signed)
Stroke Prevention Some medical conditions and behaviors are associated with an increased chance of having a stroke. You may prevent a stroke by making healthy choices and managing medical conditions. Reduce your risk of having a stroke by:  Staying physically active. Get at least 30 minutes of activity on most or all days.  Not smoking. It may also be helpful to avoid exposure to secondhand smoke.  Limiting alcohol use. Moderate alcohol use is considered to be:  No more than 2 drinks per day for men.  No more than 1 drink per day for nonpregnant women.  Eating healthy foods.  Include 5 or more servings of fruits and vegetables a day.  Certain diets may be prescribed to address high blood pressure, high cholesterol, diabetes, or obesity.  Managing your cholesterol levels.  A low-saturated fat, low-trans fat, low-cholesterol, and high-fiber diet may control cholesterol levels.  Take any prescribed medicines to control cholesterol as directed by your caregiver.  Managing your diabetes.  A controlled-carbohydrate, controlled-sugar diet is recommended to manage diabetes.  Take any prescribed medicines to control diabetes as directed by your caregiver.  Controlling your high blood pressure (hypertension).  A low-salt (sodium), low-saturated fat, low-trans fat, and low-cholesterol diet is recommended to manage high blood pressure.  Take any prescribed medicines to control hypertension as directed by your caregiver.  Maintaining a healthy weight.  A reduced-calorie, low-sodium, low-saturated fat, low-trans fat, low-cholesterol diet is recommended to manage weight.  Stopping drug abuse.  Avoiding birth control pills.  Talk to your caregiver about the risks of taking birth control pills if you are over 85 years old, smoke, get migraines, or have ever had a blood clot.  Getting evaluated for sleep disorders (sleep apnea).  Talk to your caregiver about getting a sleep evaluation  if you snore a lot or have excessive sleepiness.  Taking medicines as directed by your caregiver.  For some people, aspirin or blood thinners (anticoagulants) are helpful in reducing the risk of forming abnormal blood clots that can lead to stroke. If you have the irregular heart rhythm of atrial fibrillation, you should be on a blood thinner unless there is a good reason you cannot take them.  Understand all your medicine instructions. SEEK IMMEDIATE MEDICAL CARE IF:   You have sudden weakness or numbness of the face, arm, or leg, especially on one side of the body.  You have sudden confusion.  You have trouble speaking (aphasia) or understanding.  You have sudden trouble seeing in one or both eyes.  You have sudden trouble walking.  You have dizziness.  You have a loss of balance or coordination.  You have a sudden, severe headache with no known cause.  You have new chest pain or an irregular heartbeat. Any of these symptoms may represent a serious problem that is an emergency. Do not wait to see if the symptoms will go away. Get medical help right away. Call your local emergency services (911 in [place] Do not drive yourself to the hospital. Document Released: 01/24/2005 Document Revised: 03/10/2012 Document Reviewed: 06/19/2013 [facility] Patient Information 2014 [place].   Smokeless Tobacco Use Smokeless tobacco is a loose, fine, or stringy tobacco. The tobacco is not smoked like a cigarette, but it is chewed or held in the lips or cheeks. It resembles tea and comes from the leaves of the tobacco plant. Smokeless tobacco is usually flavored, sweetened, or processed in some way. Although smokeless tobacco is not smoked into the lungs, its chemicals are absorbed  through the membranes in the mouth and into the bloodstream. Its chemicals are also swallowed in saliva. The chemicals (nicotine and other toxins) are known to cause cancer. Smokeless tobacco contains up to 28  differentcarcinogens. CAUSES Nicotine is addictive. Smokeless tobacco contains nicotine, which is a stimulant. This stimulant can give you a "buzz" or altered state. People can become addicted to the feeling it delivers.  SYMPTOMS Smokeless tobacco can cause health problems, including:  Bad breath.  Yellow-brown teeth.  Mouth sores.  Cracking and bleeding lips.  Gum disease, gum recession, and bone loss around the teeth.  Tooth decay.  Increased or irregular heart rate.  High blood pressure, heart disease, and stroke.  Cancer of the mouth, lips, tongue, pancreas, voice box (larynx), esophagus, colon, and bladder.  Precancerous lesion of the soft tissues of the mouth (leukoplakia).  Loss of your sense of taste. TREATMENT Talk with your caregiver about ways you can quit. Quitting tobacco is a good decision for your health. Nicotine is addictive, but several options are available to help you quit including:  Nicotine replacement therapy (gum or patch).  Support and cessation programs. The following tips can help you quit:  Write down the reasons you would like to quit and look at them often.  Set a date during a low stress time to stop or cut back.  Ask family and friends for their support.  Remove all tobacco products from your home and work.  Replace the chewing tobacco with things like beef jerky, sunflower seeds, or shredded coconut.  Avoid situations that may make you want to chew tobacco.  Exercise and eat a healthy diet.  When you crave tobacco, distract yourself with drinking water, sugarless chewing gum, sugarless hard candy, exercising, or deep breathing. HOME CARE INSTRUCTIONS  See your dentist for regular oral health exams every 6 months.  Follow up with your caregiver as recommended. SEEK MEDICAL CARE OR DENTAL CARE IF:  You have bleeding or cracking lips, gums, or cheeks.  You have mouth sores, discolorations, or pain.  You have tooth  pain.  You develop persistent irritation, burning, or sores in the mouth.  You have pain, tenderness, or numbness in the mouth.  You develop a lump, bumpy patch, or hardened skin inside the mouth.  The color changes inside your mouth (gray, white, or red spots).  You have difficulty chewing, swallowing, or speaking. Document Released: 05/21/2011 Document Revised: 03/10/2012 Document Reviewed: 05/21/2011 [facility] Patient Information 2014 [place], [place].

## 2014-01-05 NOTE — Progress Notes (Signed)
Established Carotid Patient  History of Present Illness  [name] [name] is a 73 y.o. male patient of Dr. Donnetta Hutching who has known carotid stenosis, he returns today for carotid arteries surveillance.  Patient has not had previous carotid artery intervention.  In June of 2011 or 2012 he experienced mild left facial drooping that lasted 2-3 minutes, was evaluated at [facility] ED, states CT of his head did not show a stroke; states he has had not had any further TIA or stroke symptoms.  He states that his blood pressure usually runs 125-135/65-70. He admits to not getting enough exercise. Has had a cough for about a week, states his cold is improving, denies dyspnea. He denies claudication symptoms.  The patient denies amaurosis fugax or monocular blindness.   Pt. denies hemiplegia.  The patient denies receptive or expressive aphasia.  Pt. denies extremity weakness.   Patient denies New Medical or Surgical History.  Pt Diabetic: No, 6.4 A1C six months ago, but states he does not have DM Pt smoker: non-smoker, but uses smokeless tobacco daily, that is 6-8x/day  Pt meds include: Statin : Yes ASA: Yes Other anticoagulants/antiplatelets: Plavix   Past Medical History  Diagnosis Date  . Hypertension   . Coronary artery disease   . Cerebrovascular disease   . Renal insufficiency   . Hypercholesterolemia   . Diabetes mellitus   . Overweight(278.02)   . Personal history of colonic polyps 10/08/2006    tubular adenomas  . Shoulder pain   . Vitamin D deficiency   . Gynecomastia   . Transient ischemic attack   . Keloid   . Neurodermatitis     Social History History  Substance Use Topics  . Smoking status: Never Smoker   . Smokeless tobacco: Current User     Comment: uses dip 3-4 times per week  . Alcohol Use: No    Family History Family History  Problem Relation Age of Onset  . Colon cancer Neg Hx   . Lung cancer Paternal Uncle     questionable as to if it was lung ca  . Heart  disease Mother   . Diabetes Mother   . Kidney disease Mother     Surgical History Past Surgical History  Procedure Laterality Date  . Pilonidial cystectomy  1989  . Keloid surgery on chest scar  04/2008    Dr. Dessie Coma  . Coronary artery bypass graft      4 vessel    Allergies  Allergen Reactions  . Niacin     REACTION: intol to NIACIN w/ headaches    Current Outpatient Prescriptions  Medication Sig Dispense Refill  . aspirin 81 MG tablet Take 81 mg by mouth daily.        [name] atorvastatin (LIPITOR) 20 MG tablet Take 0.5 tablets (10 mg total) by mouth daily. Take as directed  30 tablet  11  . atorvastatin (LIPITOR) 20 MG tablet TAKE 1 TABLET BY MOUTH DAILY  30 tablet  5  . Cholecalciferol (VITAMIN D) 2000 UNITS CAPS Take 1 capsule by mouth daily.        . clopidogrel (PLAVIX) 75 MG tablet TAKE 1 TABLET BY MOUTH EVERY DAY  30 tablet  11  . hydrochlorothiazide (MICROZIDE) 12.5 MG capsule Take 1 capsule (12.5 mg total) by mouth daily.  90 capsule  3  . nitroGLYCERIN (NITROSTAT) 0.4 MG SL tablet Place 1 tablet (0.4 mg total) under the tongue every 5 (five) minutes as needed for chest pain.  25 tablet  3  . sildenafil (REVATIO) 20 MG tablet 2-5 tablets as needed for sexual activity  50 tablet  5  . tadalafil (CIALIS) 20 MG tablet Take as directed  10 tablet  5  . valsartan (DIOVAN) 160 MG tablet TAKE 1 TABLET EVERY DAY  30 tablet  2   No current facility-administered medications for this visit.    Review of Systems : See HPI for pertinent positives and negatives.  Physical Examination  Filed Vitals:   01/05/14 1323  BP: 155/86  Pulse: 76  Temp: 97.4 F (36.3 C)  Resp: 16   Filed Weights   01/05/14 1323  Weight: 228 lb (103.42 kg)   Body mass index is 29.26 kg/(m^2).   General: WDWN male in NAD GAIT: normal Eyes: PERRLA Pulmonary:  CTAB, Negative  Rales, Negative rhonchi, & Negative wheezing. Occasional dry cough.  Cardiac: regular Rhythm ,  Negative  Murmurs.  VASCULAR EXAM Carotid Bruits Left Right   Negative Negative    Radial pulses are 2+ palpable and equal.                                                                                                                            LE Pulses LEFT RIGHT       POPLITEAL   palpable    palpable       POSTERIOR TIBIAL   palpable    palpable        DORSALIS PEDIS      ANTERIOR TIBIAL not palpable  not palpable     Gastrointestinal: soft, nontender, BS WNL, no r/g,  negative masses.  Musculoskeletal: Negative muscle atrophy/wasting. M/S 5/5 throughout, Extremities without ischemic changes.  Neurologic: A&O X 3; Appropriate Affect ; SENSATION ;normal;  Speech is normal CN 2-12 intact, Pain and light touch intact in extremities, Motor exam as listed above.   Non-Invasive Vascular Imaging CAROTID DUPLEX 01/05/2014   Right ICA: 60 - 79 % (upper end of range) stenosis. Left ICA: 40 - 59 % stenosis.  Increased velocities in both ICA's (worsening of stenoses).  Assessment: [name] [name] is a 73 y.o. male who presents with asymptomatic moderate/severe right ICA stenosis and mild/moderate left ICA stenosis. Both ICA stenoses have worsened slightly. His risk factors for progression of atherosclerosis are metabolic syndrome, overweight, lack of exercise, and use of smokeless tobacco; he was counseled about tobacco use and how to address his other risk factors.  Plan: Follow-up in 6 months with Carotid Duplex scan.   I discussed in depth with the patient the nature of atherosclerosis, and emphasized the importance of maximal medical management including strict control of blood pressure, blood glucose, and lipid levels, obtaining regular exercise, and cessation of smokeless tobacco.  The patient is aware that without maximal medical management the underlying atherosclerotic disease process will progress, limiting the benefit of any interventions. The patient was given information about  stroke prevention and what symptoms should prompt the patient to seek immediate  medical care. Thank you for allowing [place] to participate in this patient's care.  Clemon Chambers, RN, MSN, FNP-C [facility] Office: [phone]  Clinic Physician: Early  01/05/2014 1:27 PM

## 2014-01-11 ENCOUNTER — Ambulatory Visit: Payer: Medicare Other | Admitting: Pulmonary Disease

## 2014-01-22 ENCOUNTER — Ambulatory Visit (INDEPENDENT_AMBULATORY_CARE_PROVIDER_SITE_OTHER): Payer: Medicare Other | Admitting: Pulmonary Disease

## 2014-01-22 ENCOUNTER — Encounter: Payer: Self-pay | Admitting: Pulmonary Disease

## 2014-01-22 VITALS — BP 130/60 | HR 85 | Temp 97.3°F | Ht 74.0 in | Wt 230.2 lb

## 2014-01-22 DIAGNOSIS — R7309 Other abnormal glucose: Secondary | ICD-10-CM

## 2014-01-22 DIAGNOSIS — N529 Male erectile dysfunction, unspecified: Secondary | ICD-10-CM

## 2014-01-22 DIAGNOSIS — E7800 Pure hypercholesterolemia, unspecified: Secondary | ICD-10-CM

## 2014-01-22 DIAGNOSIS — E663 Overweight: Secondary | ICD-10-CM

## 2014-01-22 DIAGNOSIS — I679 Cerebrovascular disease, unspecified: Secondary | ICD-10-CM

## 2014-01-22 DIAGNOSIS — I2510 Atherosclerotic heart disease of native coronary artery without angina pectoris: Secondary | ICD-10-CM

## 2014-01-22 DIAGNOSIS — G459 Transient cerebral ischemic attack, unspecified: Secondary | ICD-10-CM

## 2014-01-22 DIAGNOSIS — I10 Essential (primary) hypertension: Secondary | ICD-10-CM

## 2014-01-22 NOTE — Progress Notes (Signed)
Subjective:    Patient ID: [name] [name], male    DOB: [birth year], 73 y.o.   MRN: [id number]  HPI 73 y/o WM here for a follow up visit... he has multiple medical problems as noted below...  SEE PREV EPIC NOTES FOR EARLIER DATA>>  ~  July 02, 2012:  92mo ROV & [name] [name] reports that he is doing well w/o new complaints or concerns...    He saw [name] 7/13 for f/u asymptomatic carotid dis> CDoppler 6/13 showed some progression of right sided stenosis (now 60-79% but denies cerebral ischemic symptoms (right side remains 40-59%); remains on ASA/Plavix & f/u planned 69mo.    He saw [name] [name] 6/13 for f/u CAD, CABG 2005, HBP, asymptomatic carotid dis> norm LVF, no angina, etc; he stopped the Norvasc due to swelling & substituted HCT 12.5mg /d...    He saw [name] 2/13 for a follow up colonoscopy that was neg- no lesions seen & f/u rec in 18yrs... We reviewed prob list, meds, xrays and labs> see below>> CXR 7/13 showed cardiomeg, s/p CABG, bronchitic changes at the lung bases, NAD... LABS 7/13:  FLP-  Chol ok on Lip10 but TG=164 HDL=27;  Chems- ok x BS=122 A1c=6.4 on diet alone;  CBC- wnl;  TSH=2.69;  PSA=1.40;  VitD=34  ~  January 02, 2013:  87mo ROV & [name] [name] says he is doing well- no new complaints or concerns; We reviewed the following medical problems during today's office visit>>     [name] on Diovan160 & HCT-12.5 (intol to BBlockers in past); BP= 142/80 & he says better at home; denies CP, palpit, SOB, edema, etc...    CAD> s/p 4 vessel CABG 2005, on ASA/ Plavix; he saw [name] 12/13 for yearly check up- stable & no changes made...    Cerebrovasc Dis> on ASA/ Plavix; followed by [name] & seen 1/14 w/ f/u CDopplers stable w/ 60-79% right ICA stenosis, they are checking Q43mo...    CHOL> on Lip20- taking 1/2 tab daily; last FLP 6/13 showed TChol 127, TG 164, HDL 27, LDL 67; he is intol Niacin & rec to incr exercise program & get wt down...    Overweight> weight= 231# unchanged; we reviewed diet,  exercise, & wt loss program...    Hx colon polyps> f/u colonoscopy 2/13 by [name]- neg, no polyps & he rec f/u in 63yrs...    DJD/ Vit D defic> he uses OTC analgesics as needed; continues on Vit D 2000u daily...    Hx TIA> SEE 6/10 Hosp> sm vessel dis, remote lacunar infarcts, intracranial atherosclerotic changes on MRA; CDopplers followed by [name] as above... We reviewed prob list, meds, xrays and labs> see below for updates >>   ~  July 02, 2013:  82mop ROV & Steph mentions "2 things"> 1) notes occas fecal urgency- it happened x2, just HAD to go, no leakage, and asked to incr fiber, metamucil, probiotics, etc... 2) BP ave 125-135/ 60-70 at home; measures 140/70 today; he thinks side effect from Diovan- specifically he stumbles if he changes directions while walking; discussed w/ pt & asked to decr the Diovan160 to 1/2 tab first & report how he's doing before we consider stopping this med...     [name] on Diovan160 & HCT-12.5 (intol to BBlockers in past); BP= 140/70 & he says better at home; denies CP, palpit, SOB, edema, etc...    CAD> s/p 4 vessel CABG 2005, on ASA/ Plavix; he saw [name] 12/13 for yearly check up- stable & no changes made...    Cerebrovasc  Dis> on ASA/ Plavix; followed by [name] & seen 1/14 w/ f/u CDopplers stable w/ 60-79% right ICA stenosis, they are checking Q79mo...    CHOL> on Lip20- taking 1/2 tab daily; FLP 6/14 shows TChol 126, TG 155, HDL 32, LDL 63; he is intol Niacin & rec to incr exercise program & get wt down...    Overweight> weight= 232# unchanged; we reviewed diet, exercise, & wt loss program...    Hx colon polyps> f/u colonoscopy 2/13 by [name]- neg, no polyps & he rec f/u in 36yrs...    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    DJD/ Vit D defic> he uses OTC analgesics as needed; continues on Vit D 2000u daily w/ last VitD level 6/13= 34...    Hx TIA> SEE 6/10 Hosp> sm vessel dis, remote lacunar infarcts, intracranial atherosclerotic changes on MRA;  CDopplers followed by [name] as above w/ 60-79% RICAstenosis...    Keloid> in lower sternal scar w/ plastic repair in past... We reviewed prob list, meds, xrays and labs> see below for updates >>  LABS 6/14:  FLP- on Lip10 w/ TChol/LDL ok but TG/HDL still off;  Chems- ok x BS=136, A1c=6.4;  CBC- wnl;  TSH=2.66;  PSA=1.65;  CRP done at pt request=0.5.Marland KitchenMarland KitchenMarland Kitchen  ~  January 22, 2014:  6-44mo [name] [name] claims that "1wk ago I changed my lifestyle"- going to the gym everyday, stopped all tobacco (snuff), etc;  He had a URI about 3wks ago- better now after OTC rx;  His CC is ED & he reports no benefit from Sildenafil- we discussed refer to Urology to discuss alternatives... We reviewed the following medical problems during today's office visit >>     [name] on Diovan160 & HCT-12.5 (intol to BBlockers in past); BP= 130/60 & he says good at home too; denies CP, palpit, SOB, edema, etc...    CAD> s/p 4 vessel CABG 2005, on ASA/ Plavix; he saw [name] [name] 8/14 for yearly check up- stable & no changes made...    Cerebrovasc Dis> on ASA/ Plavix; followed by [name] & seen 1/15 w/ f/u CDopplers stable w/ 60-79% right ICA stenosis, they are checking Q88mo...    CHOL> on Lip20- taking 1/2 tab daily; FLP 6/14 showed TChol 126, TG 155, HDL 32, LDL 63; he is intol Niacin & rec to incr exercise program & get wt down...    Overweight> weight= 230# unchanged; we reviewed diet, exercise, & wt loss program...    Hx colon polyps> f/u colonoscopy 2/13 by [name]- neg, no polyps & he rec f/u in 2yrs...    GU> he reports no benefit from Sildenafil & requests Urology appt to discuss additional alternatives...    DJD/ Vit D defic> he uses OTC analgesics as needed; continues on Vit D 2000u daily w/ last VitD level 6/13= 34...    Hx TIA> SEE 6/10 Hosp> sm vessel dis, remote lacunar infarcts, intracranial atherosclerotic changes on MRA; CDopplers followed by [name] as above w/ 60-79% RICAstenosis; on ASA/Plavix...    Keloid> in lower  sternal scar w/ plastic repair in past... We reviewed prob list, meds, xrays and labs> see below for updates >> he had the 2014 Flu vaccine in Dec...             Problem List:    HYPERTENSION (ICD-401.9) >> ~  6/09:  prev on Atenolol25mg - stopped due to side effects & feels much better off BBlockers. ~  2/10:  Avapro300 changed to Diovan160 per his insurance company for $$$  reasons. ~  11/11:  BP= 150/80 & remains asymptomatic, doesn't want to incr meds- discussed diet/ exerc... ~  5/12:  BP= 132/70 & denies HA, fatigue, visual changes, CP, palipit, dizziness, syncope, dyspnea, edema, etc... ~  1/13:  BP= 152/68 & he remains asymptomatic; we decided to add NORVASC 5mg /d ==> pt stopped on his own due to swelling. ~  6/13:  He saw DrNishan for f/u & BP= 154/80 on Diovan alone; added HCTZ 12.5mg /d... ~  7/13:  BP= 148/82 here & 130s/70s at home he says on DIOVAN160mg /d & HCTZ 12.5mg /d; continue same ~  CXR 7/13 showed cardiomeg, s/p CABG, bronchitic changes at lung bases, NAD... ~  1/14: on Diovan160 & HCT-12.5 (intol to BBlockers in past); BP= 142/80 & he says better at home; denies CP, palpit, SOB, edema, etc...  ~  7/14: on Diovan160 & HCT-12.5 (intol to BBlockers in past); BP= 140/70 & he remains asymptomatic... ~  1/15: on Diovan160 & HCT-12.5; BP= 130/60 & he says good at home too; denies CP, palpit, SOB, edema, etc  CORONARY ARTERY DISEASE (ICD-414.00) - S/P 4 vessel CABG 2/05 by DrVanTrigt> on ASA/ PLAVIX & followed by [name] [name] yearly & his notes are reviewed; he is intol to BBlocker therapy as noted... ~  1/13 & 7/13:  stable & denies angina, palpit, SOB, edema, etc; EKG showed NSR, rate71, WNL, NAD.[name].  ~  8/14: he had yearly f/u DrNishan> CAD, s/pCABG in 2005, norm LVF; EKG showed NSR, rate77, NSSTTWA, NAD; stable, rec to incr exercise & lose wt; no change in meds...   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 followed by [name] for [facility]. ~   6/10:  See 6/10 Hosp records & f/u by [name] in his office... ~  6/11:  seen by [name] & stable w/o TIAs or amaurosis; CDopplers were stable w/ bilat 40-59% ICA stenoses, f/u 42yr. ~  He was due for a follow up eval 6/12> we don't have records & will call for the report... ~  6/13:  He saw [name] for f/u asymptomatic carotid dis> CDoppler 6/13 showed some progression of right sided stenosis (now 60-79%) but denies cerebral ischemic symptoms (right side remains 40-59%); continue ASA/Plavix & f/u planned 52mo. ~  1/14:  f/u CDopplers were stable w/ 60-79% right ICA stenosis & 20-39% left ICA stenosis & f/u planned in 11mo... ~  7/14:  f/u CDopplers at [facility] showed 60-79% right ICAstenosis and 0-40% left ICAstenosis- f/u Q43mo... ~  1/15:  f/u w/ [facility] for known Carotid stenosis> CDuplex showed 60-79% right carotid stenosis and 40-59% left carotid stenosis; continue ASA/Plavix and lifestyle mod strategies...  VENOUS INSUFFICIENCY (ICD-459.81) - he is aware of need to elim salt, elevate legs, wear support hose.  HYPERCHOLESTEROLEMIA (ICD-272.0) - controlled on LIPITOR 20- 1/2 tab daily... he has tried NIACIN in the past and refuses to take it again due to headaches. ~  FLP 5/08 showed TChol 125, TG 171, HDL 24, LDL 67...  ~  [place] 12/08 showed TChol 107, TG 131, HDL 23, LDL 58... rec- same med, better diet & exercise. ~  FLP 2/10 on Lip10 (wt=235#) showed TChol 119, TG 158, HDL 24, LDL 63... rec- ditto ~  [place] 11/10 on Lip10 (wt=236#) showed TChol 115, TG 128, HDL 28, LDL 62 ~  FLP 5/11 on Lip10 (wt=233#) showed TChol 119, TG 144, HDL 27, LDL 63 ~  DrNishan suggested that he cut the Lipitor to 5mg /d & add Niaspan, but he refuses  the Niacin preps due to HAs & wants to contin Lip10. ~  FLP 11/11 on Lip10 (wt=235#) showed TChol 119, TG 113, HDL 31, LDL 66 ~  FLP 5/12 on Lip10 (wt=229#) showed TChol 118, TG 150, HDL 28, LDL 60 ~  FLP 6/13 on Lip10 (wt=232#) showed TChol 127, TG 164, HDL 27, LDL 67... He tells me  [name] [name] wanted him in an HDL trial... ~  6/14: on Lip20- taking 1/2 tab daily; FLP 6/14 shows TChol 126, TG 155, HDL 32, LDL 63; he is intol Niacin & rec to incr exercise program & get wt down  DIABETES MELLITUS, BORDERLINE (ICD-790.29) - On diet Rx alone... ~  labs in hosp 6/10 showed BS= 109-113 ~  labs 11/10 showed BS= 109, A1c= 5.9 ~  labs 5/11 showed BS= 107 ~  labs 11/11 showed BS= 108 ~  Labs 5/12 showed BS= 97, A1c= 6.0 ~  Labs 6/13 showed BS=122, A1c= 6.4... Needs better diet & wt reduction. ~  Labs 6/14 showed BS= 136, A1c= 6.4  OVERWEIGHT (ICD-278.02) - he is 6'2" tall and weighs ~230# for a BMI of 30... we have discussed diet & exercise stategies.  COLONIC POLYPS (ICD-211.3) - last colonoscopy 10/07 by [name] showed several 5-6 mm polyps... path= tubular adenomas ... f/u planned 5 years. ~  Colonoscopy 2/13 by [name] was neg- no recurrent polyps etc & he felt that 79yr f/u was appropriate...  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 Rx w/ PT.  VITAMIN D DEFIC >> on Vit D supplement 2000u daily... ~  Labs 2/10 showed Vit D level = 14... rec to start Vit D supplement OTC... ~  Labs 6/13 showed Vit D level = 34... rec to continue his VitD supplement daily...  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 ~  6/10:  Hosp w/ TIA w/ left face & arm symptoms that resolved quickly; CT showed small vessel disease;  MRI showed bilat remote lacunar infarcts, no acute infarct, +sm vessel dis;  MRA showed intracranial atherosclerotic changes, & ?focal stenosis (?70%) of left ICA in the neck; he saw [name] 06/08/09 (no bruits) w/ CDopplers done showing bilat 40-59% carotid stenoses & agreed w/ ASA + Plavix and  f/u 21yr...  ~  6/13: saw [name] for f/u asymptomatic carotid dis> CDoppler 6/13 showed some progression of right sided stenosis (now 60-79%) but denies cerebral ischemic symptoms (right side remains 40-59%); continue ASA/Plavix & f/u planned 53mo. ~  1/14: he continues Q30mo f/u w/ [facility]- stable w/o cerebral ischemic symptoms...  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 he had plastic surg by [facility] 5/09 w/ improved scar.  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  HEALTH MAINTENANCE: ~  GI:  followed by [name] & last colonoscopy was 10/07 w/ f/u planned 31yrs. ~  GU:  he is up to date on DRE & PSA checks here (PSA remains in the 1-2 range). ~  Immunizations:  he had [place] in 2000 w/ repeat PNEUMOVAX 11/10 & TDAP 5/11... he gets the yearly Seasonal Flu vaccine each fall...   Past Surgical History  Procedure Laterality Date  . Pilonidial cystectomy  1989  . Keloid surgery on chest scar  04/2008  Dr. Dessie Coma  . Coronary artery bypass graft      4 vessel    Outpatient Encounter Prescriptions as of 01/22/2014  Medication Sig  . aspirin 81 MG tablet Take 81 mg by mouth daily.    [name] atorvastatin (LIPITOR) 20 MG tablet Take 0.5 tablets (10 mg total) by mouth daily. Take as directed  . Cholecalciferol (VITAMIN D) 2000 UNITS CAPS Take 1 capsule by mouth daily.    . clopidogrel (PLAVIX) 75 MG tablet TAKE 1 TABLET BY MOUTH EVERY DAY  . hydrochlorothiazide (MICROZIDE) 12.5 MG capsule Take 1 capsule (12.5 mg total) by mouth daily.  . nitroGLYCERIN (NITROSTAT) 0.4 MG SL tablet Place 1 tablet (0.4 mg total) under the tongue every 5 (five) minutes as needed for chest pain.  . sildenafil (REVATIO) 20 MG tablet 2-5 tablets as needed for sexual activity  . valsartan (DIOVAN) 160 MG tablet TAKE 1 TABLET EVERY DAY  . [DISCONTINUED] atorvastatin (LIPITOR) 20 MG tablet TAKE 1 TABLET BY MOUTH DAILY    Allergies  Allergen Reactions   . Niacin     REACTION: intol to NIACIN w/ headaches    Current Medications, Allergies, Past Medical History, Past Surgical History, Family History, and Social History were reviewed in [employer] record.    Review of Systems         See HPI - all other systems neg except as noted... The patient denies anorexia, fever, weight loss, weight gain, vision loss, decreased hearing, hoarseness, chest pain, syncope, dyspnea on exertion, peripheral edema, prolonged cough, headaches, hemoptysis, abdominal pain, melena, hematochezia, severe indigestion/heartburn, hematuria, incontinence, muscle weakness, suspicious skin lesions, transient blindness, difficulty walking, depression, unusual weight change, abnormal bleeding, enlarged lymph nodes, and angioedema.     Objective:   Physical Exam    WD, Overweight, 73 y/o WM in NAD... Vital Signs:  Reviewed> GENERAL:  Alert & oriented; pleasant & cooperative. HEENT:  [place]/AT, EOM-wnl, PERRLA, Fundi-benign, EACs-clear, TMs-wnl, NOSE-clear, THROAT-clear & wnl. NECK:  Supple w/ fairROM; no JVD; normal carotid impulses w/o bruits; no thyromegaly or nodules palpated; no lymphadenopathy. CHEST:  Clear to P & A; without wheezes/ rales/ or rhonchi. HEART:  Median sternotomy scar w/ improved keloid, regular rhythm; without murmurs/ rubs/ or gallops. ABDOMEN:  Soft & nontender; normal bowel sounds; no organomegaly or masses detected. 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 EXT: without deformities, mild arthritic changes; + venous insuffic changes, no edema. NEURO:  CN's intact; motor testing normal; sensory testing normal; gait normal & balance OK. DERM:  Keloid as noted, no other lesions...  RADIOLOGY DATA:  Reviewed in the EPIC EMR & discussed w/ the patient...  LABORATORY DATA:  Reviewed in the EPIC EMR & discussed w/ the patient...   Assessment & Plan:    [name]  On Diovan160 & HCT 12.5 w/ improved BP; needs to lose wt &  elim sodium...  CEREBROVASC DIS/ TIA>  No further cerebral ischemic symptoms on the ASA/ Plavix; f/u CDoppler w/ 60-79% RICAstenosis & [name] plans 96mo ROV.  CHOL>  On Lip10 & stable; needs better low fat diet & get wt down; DrNishan wanted in a drug study...  DM> adeq control w/ diet alone but he understands the importance of wt reduction.  GI>  He had colonoscopy f/u 2/13 & it was neg...  Other medical problems as noted...   Patient's Medications  New Prescriptions   No medications on file  Previous Medications   ASPIRIN 81 MG TABLET  Take 81 mg by mouth daily.     ATORVASTATIN (LIPITOR) 20 MG TABLET    Take 0.5 tablets (10 mg total) by mouth daily. Take as directed   CHOLECALCIFEROL (VITAMIN D) 2000 UNITS CAPS    Take 1 capsule by mouth daily.     CLOPIDOGREL (PLAVIX) 75 MG TABLET    TAKE 1 TABLET BY MOUTH EVERY DAY   HYDROCHLOROTHIAZIDE (MICROZIDE) 12.5 MG CAPSULE    Take 1 capsule (12.5 mg total) by mouth daily.   NITROGLYCERIN (NITROSTAT) 0.4 MG SL TABLET    Place 1 tablet (0.4 mg total) under the tongue every 5 (five) minutes as needed for chest pain.   SILDENAFIL (REVATIO) 20 MG TABLET    2-5 tablets as needed for sexual activity   VALSARTAN (DIOVAN) 160 MG TABLET    TAKE 1 TABLET EVERY DAY  Modified Medications   No medications on file  Discontinued Medications   ATORVASTATIN (LIPITOR) 20 MG TABLET    TAKE 1 TABLET BY MOUTH DAILY

## 2014-01-22 NOTE — Patient Instructions (Signed)
Today we updated your med list in our EPIC system...    Continue your current medications the same...  We will set up a referral to the Urologists as we discussed...  Keep up the good work w/ diet & exercise!!!  Call for any questions...  Let's plan a follow up visit in 21mo w/ FASTING blood work & a CXR at that time.Marland KitchenMarland Kitchen

## 2014-03-29 ENCOUNTER — Telehealth: Payer: Self-pay | Admitting: Pulmonary Disease

## 2014-03-29 NOTE — Telephone Encounter (Signed)
Pt states he needs to speak w/ the nurse Tammy who is a triage nurse.  Pt states that this is a personal message &  that this Tammy smiles all of the time, has light brown hair & is around 42.  Satira Anis

## 2014-03-29 NOTE — Telephone Encounter (Signed)
[email or web address].Chavie Kolinski, CMA  

## 2014-03-29 NOTE — Telephone Encounter (Signed)
lmomtcb x1 for pt on named VM.  

## 2014-03-29 NOTE — Telephone Encounter (Signed)
Pt returning call.[name] [name] ° °

## 2014-03-29 NOTE — Telephone Encounter (Signed)
Called and spoke with pt and he stated that he will keep his appt with SN in July.  Nothing further is needed.

## 2014-03-29 NOTE — Telephone Encounter (Signed)
Patient returning call.

## 2014-03-30 ENCOUNTER — Other Ambulatory Visit: Payer: Self-pay

## 2014-03-30 MED ORDER — VALSARTAN 160 MG PO TABS: ORAL_TABLET | ORAL | Status: DC

## 2014-06-27 ENCOUNTER — Other Ambulatory Visit: Payer: Self-pay | Admitting: Pulmonary Disease

## 2014-06-30 ENCOUNTER — Other Ambulatory Visit: Payer: Self-pay

## 2014-06-30 MED ORDER — HYDROCHLOROTHIAZIDE 12.5 MG PO CAPS: 12.5000 mg | ORAL_CAPSULE | Freq: Every day | ORAL | Status: DC

## 2014-07-13 ENCOUNTER — Other Ambulatory Visit (HOSPITAL_COMMUNITY): Payer: Medicare Other

## 2014-07-13 ENCOUNTER — Ambulatory Visit: Payer: Medicare Other | Admitting: Family

## 2014-07-19 ENCOUNTER — Telehealth: Payer: Self-pay | Admitting: Pulmonary Disease

## 2014-07-19 DIAGNOSIS — E7800 Pure hypercholesterolemia, unspecified: Secondary | ICD-10-CM

## 2014-07-19 DIAGNOSIS — I679 Cerebrovascular disease, unspecified: Secondary | ICD-10-CM

## 2014-07-19 DIAGNOSIS — Z125 Encounter for screening for malignant neoplasm of prostate: Secondary | ICD-10-CM

## 2014-07-19 DIAGNOSIS — D126 Benign neoplasm of colon, unspecified: Secondary | ICD-10-CM

## 2014-07-19 DIAGNOSIS — R7309 Other abnormal glucose: Secondary | ICD-10-CM

## 2014-07-19 DIAGNOSIS — I10 Essential (primary) hypertension: Secondary | ICD-10-CM

## 2014-07-19 DIAGNOSIS — F419 Anxiety disorder, unspecified: Secondary | ICD-10-CM

## 2014-07-19 NOTE — Telephone Encounter (Signed)
lmomtcb x 1 Labs have been placed in the computer per pts request.

## 2014-07-20 NOTE — Telephone Encounter (Signed)
Pt advised. [name] [name], CMA  

## 2014-07-21 ENCOUNTER — Other Ambulatory Visit (INDEPENDENT_AMBULATORY_CARE_PROVIDER_SITE_OTHER): Payer: Medicare Other

## 2014-07-21 DIAGNOSIS — E7800 Pure hypercholesterolemia, unspecified: Secondary | ICD-10-CM

## 2014-07-21 DIAGNOSIS — F419 Anxiety disorder, unspecified: Secondary | ICD-10-CM

## 2014-07-21 DIAGNOSIS — I679 Cerebrovascular disease, unspecified: Secondary | ICD-10-CM

## 2014-07-21 DIAGNOSIS — R7309 Other abnormal glucose: Secondary | ICD-10-CM

## 2014-07-21 DIAGNOSIS — Z125 Encounter for screening for malignant neoplasm of prostate: Secondary | ICD-10-CM

## 2014-07-21 DIAGNOSIS — I10 Essential (primary) hypertension: Secondary | ICD-10-CM

## 2014-07-21 DIAGNOSIS — F411 Generalized anxiety disorder: Secondary | ICD-10-CM

## 2014-07-21 DIAGNOSIS — D126 Benign neoplasm of colon, unspecified: Secondary | ICD-10-CM

## 2014-07-21 LAB — BASIC METABOLIC PANEL
BUN: 18 mg/dL (ref 6–23)
CO2: 30 mEq/L (ref 19–32)
Calcium: 9.1 mg/dL (ref 8.4–10.5)
Chloride: 102 mEq/L (ref 96–112)
Creatinine, Ser: 1.1 mg/dL (ref 0.4–1.5)
GFR: 72.47 mL/min (ref >60.00)
Glucose, Bld: 115 mg/dL — ABNORMAL HIGH (ref 70–99)
Potassium: 4 mEq/L (ref 3.5–5.1)
Sodium: 140 mEq/L (ref 135–145)

## 2014-07-21 LAB — CBC WITH DIFFERENTIAL/PLATELET
Basophils Absolute: 0 10*3/uL (ref 0.0–0.1)
Basophils Relative: 0.3 % (ref 0.0–3.0)
Eosinophils Absolute: 0.2 10*3/uL (ref 0.0–0.7)
Eosinophils Relative: 2.5 % (ref 0.0–5.0)
HCT: 46.5 % (ref 39.0–52.0)
Hemoglobin: 15.8 g/dL (ref 13.0–17.0)
Lymphocytes Relative: 21.1 % (ref 12.0–46.0)
Lymphs Abs: 1.4 10*3/uL (ref 0.7–4.0)
MCHC: 34 g/dL (ref 30.0–36.0)
MCV: 101.5 fl — ABNORMAL HIGH (ref 78.0–100.0)
Monocytes Absolute: 0.6 10*3/uL (ref 0.1–1.0)
Monocytes Relative: 9.7 % (ref 3.0–12.0)
Neutro Abs: 4.3 10*3/uL (ref 1.4–7.7)
Neutrophils Relative %: 66.4 % (ref 43.0–77.0)
Platelets: 187 10*3/uL (ref 150.0–400.0)
RBC: 4.58 Mil/uL (ref 4.22–5.81)
RDW: 13.8 % (ref 11.5–15.5)
WBC: 6.5 10*3/uL (ref 4.0–10.5)

## 2014-07-21 LAB — HEPATIC FUNCTION PANEL
ALT: 26 U/L (ref 0–53)
AST: 26 U/L (ref 0–37)
Albumin: 4 g/dL (ref 3.5–5.2)
Alkaline Phosphatase: 63 U/L (ref 39–117)
Bilirubin, Direct: 0.1 mg/dL (ref 0.0–0.3)
Total Bilirubin: 1.1 mg/dL (ref 0.2–1.2)
Total Protein: 6.7 g/dL (ref 6.0–8.3)

## 2014-07-21 LAB — PSA: PSA: 1.78 ng/mL (ref 0.10–4.00)

## 2014-07-21 LAB — C-REACTIVE PROTEIN: CRP: <0.5 mg/dL (ref 0.5–20.0)

## 2014-07-21 LAB — LIPID PANEL
Cholesterol: 118 mg/dL (ref 0–200)
HDL: 28.9 mg/dL — ABNORMAL LOW (ref >39.00)
LDL Cholesterol: 56 mg/dL (ref 0–99)
NonHDL: 89.1
Total CHOL/HDL Ratio: 4
Triglycerides: 166 mg/dL — ABNORMAL HIGH (ref 0.0–149.0)
VLDL: 33.2 mg/dL (ref 0.0–40.0)

## 2014-07-21 LAB — HEMOGLOBIN A1C: Hgb A1c MFr Bld: 6.2 % (ref 4.6–6.5)

## 2014-07-21 LAB — TSH: TSH: 2.51 u[IU]/mL (ref 0.35–4.50)

## 2014-07-22 ENCOUNTER — Encounter: Payer: Self-pay | Admitting: Family

## 2014-07-23 ENCOUNTER — Encounter: Payer: Self-pay | Admitting: Family

## 2014-07-23 ENCOUNTER — Ambulatory Visit (INDEPENDENT_AMBULATORY_CARE_PROVIDER_SITE_OTHER): Payer: Medicare Other | Admitting: Family

## 2014-07-23 ENCOUNTER — Ambulatory Visit (HOSPITAL_COMMUNITY)
Admission: RE | Admit: 2014-07-23 | Discharge: 2014-07-23 | Disposition: A | Discharge status: Home / Self Care | Payer: Medicare Other | Source: Ambulatory Visit | Attending: Family | Admitting: Family

## 2014-07-23 VITALS — BP 159/87 | HR 65 | Resp 16 | Ht 74.0 in | Wt 226.0 lb

## 2014-07-23 DIAGNOSIS — I63239 Cerebral infarction due to unspecified occlusion or stenosis of unspecified carotid arteries: Secondary | ICD-10-CM | POA: Diagnosis not present

## 2014-07-23 DIAGNOSIS — I6529 Occlusion and stenosis of unspecified carotid artery: Secondary | ICD-10-CM | POA: Diagnosis not present

## 2014-07-23 DIAGNOSIS — Z48812 Encounter for surgical aftercare following surgery on the circulatory system: Secondary | ICD-10-CM

## 2014-07-23 NOTE — Patient Instructions (Addendum)
Stroke Prevention Some medical conditions and behaviors are associated with an increased chance of having a stroke. You may prevent a stroke by making healthy choices and managing medical conditions. HOW CAN I REDUCE MY RISK OF HAVING A STROKE?   Stay physically active. Get at least 30 minutes of activity on most or all days.  Do not smoke. It may also be helpful to avoid exposure to secondhand smoke.  Limit alcohol use. Moderate alcohol use is considered to be:  No more than 2 drinks per day for men.  No more than 1 drink per day for nonpregnant women.  Eat healthy foods. This involves:  Eating 5 or more servings of fruits and vegetables a day.  Making dietary changes that address high blood pressure (hypertension), high cholesterol, diabetes, or obesity.  Manage your cholesterol levels.  Making food choices that are high in fiber and low in saturated fat, trans fat, and cholesterol may control cholesterol levels.  Take any prescribed medicines to control cholesterol as directed by your health care provider.  Manage your diabetes.  Controlling your carbohydrate and sugar intake is recommended to manage diabetes.  Take any prescribed medicines to control diabetes as directed by your health care provider.  Control your hypertension.  Making food choices that are low in salt (sodium), saturated fat, trans fat, and cholesterol is recommended to manage hypertension.  Take any prescribed medicines to control hypertension as directed by your health care provider.  Maintain a healthy weight.  Reducing calorie intake and making food choices that are low in sodium, saturated fat, trans fat, and cholesterol are recommended to manage weight.  Stop drug abuse.  Avoid taking birth control pills.  Talk to your health care provider about the risks of taking birth control pills if you are over 35 years old, smoke, get migraines, or have ever had a blood clot.  Get evaluated for sleep  disorders (sleep apnea).  Talk to your health care provider about getting a sleep evaluation if you snore a lot or have excessive sleepiness.  Take medicines only as directed by your health care provider.  For some people, aspirin or blood thinners (anticoagulants) are helpful in reducing the risk of forming abnormal blood clots that can lead to stroke. If you have the irregular heart rhythm of atrial fibrillation, you should be on a blood thinner unless there is a good reason you cannot take them.  Understand all your medicine instructions.  Make sure that other conditions (such as anemia or atherosclerosis) are addressed. SEEK IMMEDIATE MEDICAL CARE IF:   You have sudden weakness or numbness of the face, arm, or leg, especially on one side of the body.  Your face or eyelid droops to one side.  You have sudden confusion.  You have trouble speaking (aphasia) or understanding.  You have sudden trouble seeing in one or both eyes.  You have sudden trouble walking.  You have dizziness.  You have a loss of balance or coordination.  You have a sudden, severe headache with no known cause.  You have new chest pain or an irregular heartbeat. Any of these symptoms may represent a serious problem that is an emergency. Do not wait to see if the symptoms will go away. Get medical help at once. Call your local emergency services (911 in [place] Do not drive yourself to the hospital. Document Released: 01/24/2005 Document Revised: 05/03/2014 Document Reviewed: 06/19/2013 Exit[employer] Patient Information 2015 [employer]. This information is not intended to replace advice given   to you by your health care provider. Make sure you discuss any questions you have with your health care provider.    Smokeless Tobacco Use Smokeless tobacco is a loose, fine, or stringy tobacco. The tobacco is not smoked like a cigarette, but it is chewed or held in the lips or cheeks. It resembles tea and comes from the  leaves of the tobacco plant. Smokeless tobacco is usually flavored, sweetened, or processed in some way. Although smokeless tobacco is not smoked into the lungs, its chemicals are absorbed through the membranes in the mouth and into the bloodstream. Its chemicals are also swallowed in saliva. The chemicals (nicotine and other toxins) are known to cause cancer. Smokeless tobacco contains up to 28 differentcarcinogens. CAUSES Nicotine is addictive. Smokeless tobacco contains nicotine, which is a stimulant. This stimulant can give you a "buzz" or altered state. People can become addicted to the feeling it delivers.  SYMPTOMS Smokeless tobacco can cause health problems, including:  Bad breath.  Yellow-brown teeth.  Mouth sores.  Cracking and bleeding lips.  Gum disease, gum recession, and bone loss around the teeth.  Tooth decay.  Increased or irregular heart rate.  High blood pressure, heart disease, and stroke.  Cancer of the mouth, lips, tongue, pancreas, voice box (larynx), esophagus, colon, and bladder.  Precancerous lesion of the soft tissues of the mouth (leukoplakia).  Loss of your sense of taste. TREATMENT Talk with your caregiver about ways you can quit. Quitting tobacco is a good decision for your health. Nicotine is addictive, but several options are available to help you quit including:  Nicotine replacement therapy (gum or patch).  Support and cessation programs. The following tips can help you quit:  Write down the reasons you would like to quit and look at them often.  Set a date during a low stress time to stop or cut back.  Ask family and friends for their support.  Remove all tobacco products from your home and work.  Replace the chewing tobacco with things like beef jerky, sunflower seeds, or shredded coconut.  Avoid situations that may make you want to chew tobacco.  Exercise and eat a healthy diet.  When you crave tobacco, distract yourself with  drinking water, sugarless chewing gum, sugarless hard candy, exercising, or deep breathing. HOME CARE INSTRUCTIONS  See your dentist for regular oral health exams every 6 months.  Follow up with your caregiver as recommended. SEEK MEDICAL CARE OR DENTAL CARE IF:  You have bleeding or cracking lips, gums, or cheeks.  You have mouth sores, discolorations, or pain.  You have tooth pain.  You develop persistent irritation, burning, or sores in the mouth.  You have pain, tenderness, or numbness in the mouth.  You develop a lump, bumpy patch, or hardened skin inside the mouth.  The color changes inside your mouth (gray, white, or red spots).  You have difficulty chewing, swallowing, or speaking. Document Released: 05/21/2011 Document Revised: 03/10/2012 Document Reviewed: 05/21/2011 ExitCare Patient Information 2015 [employer]. This information is not intended to replace advice given to you by your health care provider. Make sure you discuss any questions you have with your health care provider.  

## 2014-07-23 NOTE — Addendum Note (Signed)
Addended by: Mena Goes on: 07/23/2014 12:16 PM   Modules accepted: Orders

## 2014-07-23 NOTE — Progress Notes (Signed)
Established Carotid Patient   History of Present Illness  [name] [name] is a 73 y.o. male patient of Dr. Donnetta Hutching who has known carotid stenosis, he returns today for carotid arteries surveillance.  Patient has not had previous carotid artery intervention.  In June of 2011 or 2012 he experienced mild left facial drooping that lasted 2-3 minutes, was evaluated at [facility] ED, states CT of his head did not show a stroke; states he has had not had any further TIA or stroke symptoms.  He states that his blood pressure usually runs 125-135/65-70.  He admits to not getting enough exercise.  Has had a cough for about a week, states his cold is improving, denies dyspnea.  He denies claudication symptoms.  The patient denies amaurosis fugax or monocular blindness. Pt. denies hemiplegia. The patient denies receptive or expressive aphasia. Pt. denies extremity weakness.  Patient denies New Medical or Surgical History.   Pt Diabetic: No, 6.4 A1C in 2014, but states he does not have DM  Pt smoker: non-smoker, he stopped smokeless tobacco use a week ago, in July, 2015 Pt meds include:  Statin : Yes  ASA: Yes  Other anticoagulants/antiplatelets: Plavix   Past Medical History  Diagnosis Date  . Hypertension   . Coronary artery disease   . Cerebrovascular disease   . Renal insufficiency   . Hypercholesterolemia   . Diabetes mellitus   . Overweight   . Personal history of colonic polyps 10/08/2006    tubular adenomas  . Shoulder pain   . Vitamin D deficiency   . Gynecomastia   . Transient ischemic attack   . Keloid   . Neurodermatitis     Social History History  Substance Use Topics  . Smoking status: Never Smoker   . Smokeless tobacco: Current User     Comment: uses dip 3-4 times per week  . Alcohol Use: No    Family History Family History  Problem Relation Age of Onset  . Colon cancer Neg Hx   . Lung cancer Paternal Uncle     questionable as to if it was lung ca  . Heart disease  Mother   . Diabetes Mother   . Kidney disease Mother     Surgical History Past Surgical History  Procedure Laterality Date  . Pilonidial cystectomy  1989  . Keloid surgery on chest scar  04/2008    Dr. Dessie Coma  . Coronary artery bypass graft      4 vessel    Allergies  Allergen Reactions  . Niacin     REACTION: intol to NIACIN w/ headaches    Current Outpatient Prescriptions  Medication Sig Dispense Refill  . aspirin 81 MG tablet Take 81 mg by mouth daily.        [name] atorvastatin (LIPITOR) 20 MG tablet Take 0.5 tablets (10 mg total) by mouth daily. Take as directed  30 tablet  11  . Cholecalciferol (VITAMIN D) 2000 UNITS CAPS Take 1 capsule by mouth daily.        . clopidogrel (PLAVIX) 75 MG tablet TAKE 1 TABLET BY MOUTH EVERY DAY  30 tablet  2  . hydrochlorothiazide (MICROZIDE) 12.5 MG capsule Take 1 capsule (12.5 mg total) by mouth daily.  90 capsule  1  . nitroGLYCERIN (NITROSTAT) 0.4 MG SL tablet Place 1 tablet (0.4 mg total) under the tongue every 5 (five) minutes as needed for chest pain.  25 tablet  3  . sildenafil (REVATIO) 20 MG tablet 2-5 tablets as needed  for sexual activity  50 tablet  5  . valsartan (DIOVAN) 160 MG tablet TAKE 1 TABLET EVERY DAY  30 tablet  6   No current facility-administered medications for this visit.    Review of Systems : See HPI for pertinent positives and negatives.  Physical Examination  Filed Vitals:   07/23/14 1004 07/23/14 1008  BP: 161/81 159/87  Pulse: 61 65  Resp:  16  Height:  6\' 2"  (1.88 m)  Weight:  226 lb (102.513 kg)  SpO2:  99%   Body mass index is 29 kg/(m^2).  General: WDWN male in NAD  GAIT: normal  Eyes: PERRLA  Pulmonary: CTAB, Negative Rales, Negative rhonchi, & Negative wheezing. Occasional dry cough.  Cardiac: regular Rhythm , Negative Murmurs.   VASCULAR EXAM  Carotid Bruits  Left  Right    Negative  Negative   Radial pulses are 2+ palpable and equal.  LE Pulses  LEFT  RIGHT   POPLITEAL  palpable   palpable   POSTERIOR TIBIAL  palpable  palpable   DORSALIS PEDIS  ANTERIOR TIBIAL  not palpable  not palpable    Gastrointestinal: soft, nontender, BS WNL, no r/g, negative masses.  Musculoskeletal: Negative muscle atrophy/wasting. M/S 5/5 throughout, Extremities without ischemic changes.  Neurologic: A&O X 3; Appropriate Affect ; SENSATION ;normal;  Speech is normal  CN 2-12 intact, Pain and light touch intact in extremities, Motor exam as listed above.   Non-Invasive Vascular Imaging CAROTID DUPLEX 07/23/2014   CEREBROVASCULAR DUPLEX EVALUATION    INDICATION: Carotid artery disease     PREVIOUS INTERVENTION(S):     DUPLEX EXAM:     RIGHT  LEFT  Peak Systolic Velocities (cm/s) End Diastolic Velocities (cm/s) Plaque LOCATION Peak Systolic Velocities (cm/s) End Diastolic Velocities (cm/s) Plaque  70 11  CCA PROXIMAL 69 15   63 14 HT CCA MID 83 17 HT  75 14 HT CCA DISTAL 105 17 HT  99 7 HT ECA 93 4 HT  310 101 CP ICA PROXIMAL 125 29 CP  270 77  ICA MID 133 29   206 49  ICA DISTAL 72 19     4.92 ICA / CCA Ratio (PSV) 1.60  Antegrade  Vertebral Flow Antegrade   993 Brachial Systolic Pressure (mmHg) 716  Triphasic  Brachial Artery Waveforms Triphasic     Plaque Morphology:  HM = Homogeneous, HT = Heterogeneous, CP = Calcific Plaque, SP = Smooth Plaque, IP = Irregular Plaque     ADDITIONAL FINDINGS:     IMPRESSION: Right internal carotid artery velocities suggest a 60-79% stenosis.  Left internal carotid artery velocities suggest a <40% stenosis (high end of range).     Compared to the previous exam:  No significant change in comparison to the last exam on 01/05/2014.    Assessment: [name] [name] is a 73 y.o. male who presents with asymptomatic moderate/severe right ICA stenosis and mild/moderate left ICA stenosis. No significant change in comparison to the last exam on 01/05/2014.  His risk factors for progression of atherosclerosis are metabolic syndrome, overweight,  lack of exercise. Fortunately he stopped smokeless tobacco use a week ago.   Plan: Follow-up in 6 months with Carotid Duplex scan.   I discussed in depth with the patient the nature of atherosclerosis, and emphasized the importance of maximal medical management including strict control of blood pressure, blood glucose, and lipid levels, obtaining regular exercise, and continued cessation of smoking.  The patient is aware that without maximal medical management  the underlying atherosclerotic disease process will progress, limiting the benefit of any interventions. The patient was given information about stroke prevention and what symptoms should prompt the patient to seek immediate medical care. Thank you for allowing [place] to participate in this patient's care.  Clemon Chambers, RN, MSN, FNP-C [facility] Office: [phone]  Clinic Physician: Bridgett Larsson  07/23/2014 9:29 AM

## 2014-07-28 ENCOUNTER — Ambulatory Visit (INDEPENDENT_AMBULATORY_CARE_PROVIDER_SITE_OTHER)
Admission: RE | Admit: 2014-07-28 | Discharge: 2014-07-28 | Disposition: A | Discharge status: Home / Self Care | Payer: Medicare Other | Source: Ambulatory Visit | Attending: Pulmonary Disease | Admitting: Pulmonary Disease

## 2014-07-28 ENCOUNTER — Encounter: Payer: Self-pay | Admitting: Pulmonary Disease

## 2014-07-28 ENCOUNTER — Ambulatory Visit (INDEPENDENT_AMBULATORY_CARE_PROVIDER_SITE_OTHER): Payer: Medicare Other | Admitting: Pulmonary Disease

## 2014-07-28 VITALS — BP 132/62 | HR 76 | Temp 97.6°F | Ht 74.0 in | Wt 224.2 lb

## 2014-07-28 DIAGNOSIS — I10 Essential (primary) hypertension: Secondary | ICD-10-CM

## 2014-07-28 DIAGNOSIS — G459 Transient cerebral ischemic attack, unspecified: Secondary | ICD-10-CM

## 2014-07-28 DIAGNOSIS — I2510 Atherosclerotic heart disease of native coronary artery without angina pectoris: Secondary | ICD-10-CM

## 2014-07-28 DIAGNOSIS — R7309 Other abnormal glucose: Secondary | ICD-10-CM

## 2014-07-28 DIAGNOSIS — E7800 Pure hypercholesterolemia, unspecified: Secondary | ICD-10-CM

## 2014-07-28 DIAGNOSIS — I679 Cerebrovascular disease, unspecified: Secondary | ICD-10-CM

## 2014-07-28 DIAGNOSIS — E663 Overweight: Secondary | ICD-10-CM

## 2014-07-28 NOTE — Progress Notes (Signed)
Subjective:    Patient ID: [name] [name], male    DOB: [birth year], 73 y.o.   MRN: [id number]  HPI 73 y/o WM here for a follow up visit... he has multiple medical problems as noted below...  SEE PREV EPIC NOTES FOR EARLIER DATA>>  ~  January 02, 2013:  36mo ROV & [name] [name] says he is doing well- no new complaints or concerns; We reviewed the following medical problems during today's office visit>>     [name] on Diovan160 & HCT-12.5 (intol to BBlockers in past); BP= 142/80 & he says better at home; denies CP, palpit, SOB, edema, etc...    CAD> s/p 4 vessel CABG 2005, on ASA/ Plavix; he saw [facility] 12/13 for yearly check up- stable & no changes made...    Cerebrovasc Dis> on ASA/ Plavix; followed by [name] & seen 1/14 w/ f/u CDopplers stable w/ 60-79% right ICA stenosis, they are checking Q38mo...    CHOL> on Lip20- taking 1/2 tab daily; last FLP 6/13 showed TChol 127, TG 164, HDL 27, LDL 67; he is intol Niacin & rec to incr exercise program & get wt down...    Overweight> weight= 231# unchanged; we reviewed diet, exercise, & wt loss program...    Hx colon polyps> f/u colonoscopy 2/13 by [name]- neg, no polyps & he rec f/u in 36yrs...    DJD/ Vit D defic> he uses OTC analgesics as needed; continues on Vit D 2000u daily...    Hx TIA> SEE 6/10 Hosp> sm vessel dis, remote lacunar infarcts, intracranial atherosclerotic changes on MRA; CDopplers followed by [name] as above... We reviewed prob list, meds, xrays and labs> see below for updates >>   ~  July 02, 2013:  25mop ROV & [name] [name] mentions "2 things"> 1) notes occas fecal urgency- it happened x2, just HAD to go, no leakage, and asked to incr fiber, metamucil, probiotics, etc... 2) BP ave 125-135/ 60-70 at home; measures 140/70 today; he thinks side effect from Diovan- specifically he stumbles if he changes directions while walking; discussed w/ pt & asked to decr the Diovan160 to 1/2 tab first & report how he's doing before we consider stopping this  med...     [name] on Diovan160 & HCT-12.5 (intol to BBlockers in past); BP= 140/70 & he says better at home; denies CP, palpit, SOB, edema, etc...    CAD> s/p 4 vessel CABG 2005, on ASA/ Plavix; he saw [facility] 12/13 for yearly check up- stable & no changes made...    Cerebrovasc Dis> on ASA/ Plavix; followed by [name] & seen 1/14 w/ f/u CDopplers stable w/ 60-79% right ICA stenosis, they are checking Q55mo...    CHOL> on Lip20- taking 1/2 tab daily; FLP 6/14 shows TChol 126, TG 155, HDL 32, LDL 63; he is intol Niacin & rec to incr exercise program & get wt down...    Overweight> weight= 232# unchanged; we reviewed diet, exercise, & wt loss program...    Hx colon polyps> f/u colonoscopy 2/13 by [name]- neg, no polyps & he rec f/u in 60yrs...    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    DJD/ Vit D defic> he uses OTC analgesics as needed; continues on Vit D 2000u daily w/ last VitD level 6/13= 34...    Hx TIA> SEE 6/10 Hosp> sm vessel dis, remote lacunar infarcts, intracranial atherosclerotic changes on MRA; CDopplers followed by [name] as above w/ 60-79% RICAstenosis...    Keloid> in lower sternal scar w/ plastic repair in  past... We reviewed prob list, meds, xrays and labs> see below for updates >>   LABS 6/14:  FLP- on Lip10 w/ TChol/LDL ok but TG/HDL still off;  Chems- ok x BS=136, A1c=6.4;  CBC- wnl;  TSH=2.66;  PSA=1.65;  CRP done at pt request=0.5.Marland KitchenMarland KitchenMarland Kitchen  ~  January 22, 2014:  6-42mo [name] [name] claims that "1wk ago I changed my lifestyle"- going to the gym everyday, stopped all tobacco (snuff), etc;  He had a URI about 3wks ago- better now after OTC rx;  His CC is ED & he reports no benefit from Sildenafil- we discussed refer to Urology to discuss alternatives... We reviewed the following medical problems during today's office visit >>     [name] on Diovan160 & HCT-12.5 (intol to BBlockers in past); BP= 130/60 & he says good at home too; denies CP, palpit, SOB, edema, etc...    CAD> s/p 4 vessel  CABG 2005, on ASA/ Plavix; he saw [name] 8/14 for yearly check up- stable & no changes made...    Cerebrovasc Dis> on ASA/ Plavix; followed by [name] & seen 1/15 w/ f/u CDopplers stable w/ 60-79% right ICA stenosis, they are checking Q45mo...    CHOL> on Lip20- taking 1/2 tab daily; FLP 6/14 showed TChol 126, TG 155, HDL 32, LDL 63; he is intol Niacin & rec to incr exercise program & get wt down...    Overweight> weight= 230# unchanged; we reviewed diet, exercise, & wt loss program...    Hx colon polyps> f/u colonoscopy 2/13 by [name]- neg, no polyps & he rec f/u in 37yrs...    GU> he reports no benefit from Sildenafil & requests Urology appt to discuss additional alternatives...    DJD/ Vit D defic> he uses OTC analgesics as needed; continues on Vit D 2000u daily w/ last VitD level 6/13= 34...    Hx TIA> SEE 6/10 Hosp> sm vessel dis, remote lacunar infarcts, intracranial atherosclerotic changes on MRA; CDopplers followed by [name] as above w/ 60-79% RICAstenosis; on ASA/Plavix...    Keloid> in lower sternal scar w/ plastic repair in past... We reviewed prob list, meds, xrays and labs> see below for updates >> he had the 2014 Flu vaccine in Dec...   ~  July 28, 2014:  19mo ROV & [name] [name] reports doing well, no new complaints or concerns; he reports that he has quit all tobacco products & last use was ~1wk ago (prev admits to 1 can/d), he credits his little grandson; his wt is down 6#- going to the gym vs walking several days per wk but still NOT on much of a diet... We reviewed the following medical problems during today's office visit >>     [name] on Diovan160 & HCT-12.5 (intol to BBlockers in past); BP= 132/62 & he says good at home too; denies CP, palpit, SOB, edema, etc...    CAD> s/p 4 vessel CABG 2005, on ASA/ Plavix; he saw [name] 8/14 for yearly check up- stable & no changes made...    Cerebrovasc Dis> on ASA/ Plavix; followed by [name] & seen 7/15 w/ f/u CDopplers stable w/ 60-79% right  ICA stenosis, they are checking Q69mo...    CHOL> on Lip10- taking 1/2 of 20mg  tab daily; FLP 7/15 showed TChol 118, TG 166, HDL 29, LDL 56; he is intol Niacin & rec to incr exercise program & get wt down...    Overweight> weight= 224# which is down 6#; we reviewed diet, exercise, & wt loss program...    Hx  colon polyps> f/u colonoscopy 2/13 by [name]- neg, no polyps & he rec f/u in 76yrs...    GU> he reports no benefit from Sildenafil & prev requested Urology appt to discuss additional alternatives but he never went...    DJD/ Vit D defic> he uses OTC analgesics as needed; continues on Vit D 2000u daily w/ last VitD level 6/13= 34...    Hx TIA> SEE 6/10 Hosp> sm vessel dis, remote lacunar infarcts, intracranial atherosclerotic changes on MRA; CDopplers followed by [name] as above w/ 60-79% RICAstenosis; on ASA/Plavix...    Keloid> in lower sternal scar w/ plastic repair in past... We reviewed prob list, meds, xrays and labs> see below for updates >>   CXR 7/15 showed cardiomeg, no change in contour of mediastinum compared to mult old films, clear lungs, NAD...   CDopplers 7/15 showed 60-79% RICAstenosis & <84% LICAstenosis (no change)...   LABS 7/15:  FLP- TChol&LDL ok on Lip10 but TG incr & HDL too low;  Chems- ok w/ BS=115A1c=6.2;  CBC- wnl;  TSH=2.51;  PSA=1.78...            Problem List:    HYPERTENSION (ICD-401.9) >> ~  6/09:  prev on Atenolol25mg - stopped due to side effects & feels much better off BBlockers. ~  2/10:  Avapro300 changed to Diovan160 per his insurance company for $$$ reasons. ~  11/11:  BP= 150/80 & remains asymptomatic, doesn't want to incr meds- discussed diet/ exerc... ~  5/12:  BP= 132/70 & denies HA, fatigue, visual changes, CP, palipit, dizziness, syncope, dyspnea, edema, etc... ~  1/13:  BP= 152/68 & he remains asymptomatic; we decided to add NORVASC 5mg /d ==> pt stopped on his own due to swelling. ~  6/13:  He saw DrNishan for f/u & BP= 154/80 on Diovan  alone; added HCTZ 12.5mg /d... ~  7/13:  BP= 148/82 here & 130s/70s at home he says on DIOVAN160mg /d & HCTZ 12.5mg /d; continue same ~  CXR 7/13 showed cardiomeg, s/p CABG, bronchitic changes at lung bases, NAD... ~  1/14: on Diovan160 & HCT-12.5 (intol to BBlockers in past); BP= 142/80 & he says better at home; denies CP, palpit, SOB, edema, etc...  ~  7/14: on Diovan160 & HCT-12.5 (intol to BBlockers in past); BP= 140/70 & he remains asymptomatic... ~  1/15: on Diovan160 & HCT-12.5; BP= 130/60 & he says good at home too; denies CP, palpit, SOB, edema, etc ~  CXR 7/15 showed cardiomeg, no change in contour of mediastinum compared to mult old films, clear lungs, NAD  CORONARY ARTERY DISEASE (ICD-414.00) - S/P 4 vessel CABG 2/05 by DrVanTrigt> on ASA/ PLAVIX & followed by [name] yearly & his notes are reviewed; he is intol to BBlocker therapy as noted... ~  1/13 & 7/13:  stable & denies angina, palpit, SOB, edema, etc; EKG showed NSR, rate71, WNL, NAD.[name].  ~  8/14: he had yearly f/u DrNishan> CAD, s/pCABG in 2005, norm LVF; EKG showed NSR, rate77, NSSTTWA, NAD; stable, rec to incr exercise & lose wt; no change in meds...   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 followed by [name] for [facility]. ~  6/10:  See 6/10 Hosp records & f/u by [name] in his office... ~  6/11:  seen by [name] & stable w/o TIAs or amaurosis; CDopplers were stable w/ bilat 40-59% ICA stenoses, f/u 29yr. ~  He was due for a follow up eval 6/12> we don't have records & will call for the report... ~  6/13:  He saw [name] for f/u asymptomatic carotid dis> CDoppler 6/13 showed some progression of right sided stenosis (now 60-79%) but denies cerebral ischemic symptoms (right side remains 40-59%); continue ASA/Plavix & f/u planned 62mo. ~  1/14:  f/u CDopplers were stable w/ 60-79% right ICA stenosis & 20-39% left ICA stenosis & f/u planned in 68mo... ~  7/14:  f/u CDopplers at [facility] showed 60-79%  right ICAstenosis and 0-40% left ICAstenosis- f/u Q65mo... ~  1/15:  f/u w/ [facility] for known Carotid stenosis> CDuplex showed 60-79% right carotid stenosis and 40-59% left carotid stenosis; continue ASA/Plavix and lifestyle mod strategies... ~  7/15:  f/u w/ [facility] for known Carotid stenosis> CDuplex unchanges w/ 60-79% RICAstenosis...  VENOUS INSUFFICIENCY (ICD-459.81) - he is aware of need to elim salt, elevate legs, wear support hose.  HYPERCHOLESTEROLEMIA (ICD-272.0) - controlled on LIPITOR 20- 1/2 tab daily... he has tried NIACIN in the past and refuses to take it again due to headaches. ~  FLP 5/08 showed TChol 125, TG 171, HDL 24, LDL 67...  ~  [place] 12/08 showed TChol 107, TG 131, HDL 23, LDL 58... rec- same med, better diet & exercise. ~  FLP 2/10 on Lip10 (wt=235#) showed TChol 119, TG 158, HDL 24, LDL 63... rec- ditto ~  [place] 11/10 on Lip10 (wt=236#) showed TChol 115, TG 128, HDL 28, LDL 62 ~  FLP 5/11 on Lip10 (wt=233#) showed TChol 119, TG 144, HDL 27, LDL 63 ~  DrNishan suggested that he cut the Lipitor to 5mg /d & add Niaspan, but he refuses the Niacin preps due to HAs & wants to contin Lip10. ~  FLP 11/11 on Lip10 (wt=235#) showed TChol 119, TG 113, HDL 31, LDL 66 ~  FLP 5/12 on Lip10 (wt=229#) showed TChol 118, TG 150, HDL 28, LDL 60 ~  FLP 6/13 on Lip10 (wt=232#) showed TChol 127, TG 164, HDL 27, LDL 67... He tells me [name] wanted him in an HDL trial... ~  6/14: on Lip20- taking 1/2 tab daily; FLP 6/14 shows TChol 126, TG 155, HDL 32, LDL 63; he is intol Niacin & rec to incr exercise program & get wt down ~  FLP 7/15 on Lip10 showed TChol 118, TG 166, HDL 29, LDL 56...  DIABETES MELLITUS, BORDERLINE (ICD-790.29) - On diet Rx alone... ~  labs in hosp 6/10 showed BS= 109-113 ~  labs 11/10 showed BS= 109, A1c= 5.9 ~  labs 5/11 showed BS= 107 ~  labs 11/11 showed BS= 108 ~  Labs 5/12 showed BS= 97, A1c= 6.0 ~  Labs 6/13 showed BS=122, A1c= 6.4... Needs better diet & wt reduction. ~   Labs 6/14 showed BS= 136, A1c= 6.4 ~  Labs 7/15 on diet alone showed BS=115, A1c= 6.2  OVERWEIGHT (ICD-278.02) - he is 6'2" tall and weighs ~230# for a BMI of 30... we have discussed diet & exercise stategies. ~  7/15: he has lost 6# down to 224# today...  COLONIC POLYPS (ICD-211.3) - last colonoscopy 10/07 by [name] showed several 5-6 mm polyps... path= tubular adenomas ... f/u planned 5 years. ~  Colonoscopy 2/13 by [name] was neg- no recurrent polyps etc & he felt that 17yr f/u was appropriate...  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 Rx w/ PT.  VITAMIN D  DEFIC >> on Vit D supplement 2000u daily... ~  Labs 2/10 showed Vit D level = 14... rec to start Vit D supplement OTC... ~  Labs 6/13 showed Vit D level = 34... rec to continue his VitD supplement daily...  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 ~  6/10:  Hosp w/ TIA w/ left face & arm symptoms that resolved quickly; CT showed small vessel disease;  MRI showed bilat remote lacunar infarcts, no acute infarct, +sm vessel dis;  MRA showed intracranial atherosclerotic changes, & ?focal stenosis (?70%) of left ICA in the neck; he saw [name] 06/08/09 (no bruits) w/ CDopplers done showing bilat 40-59% carotid stenoses & agreed w/ ASA + Plavix and f/u 62yr...  ~  6/13: saw [name] for f/u asymptomatic carotid dis> CDoppler 6/13 showed some progression of right sided stenosis (now 60-79%) but denies cerebral ischemic symptoms (right side remains 40-59%); continue ASA/Plavix & f/u planned 26mo. ~  1/14: he continues Q52mo f/u w/ [facility]- stable w/o cerebral ischemic symptoms...  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 he had plastic surg by [facility] 5/09 w/  improved scar.  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  HEALTH MAINTENANCE: ~  GI:  followed by [name] & last colonoscopy was 10/07 w/ f/u planned 29yrs. ~  GU:  he is up to date on DRE & PSA checks here (PSA remains in the 1-2 range). ~  Immunizations:  he had [place] in 2000 w/ repeat PNEUMOVAX 11/10 & TDAP 5/11... he gets the yearly Seasonal Flu vaccine each fall...   Past Surgical History  Procedure Laterality Date  . Pilonidial cystectomy  1989  . Keloid surgery on chest scar  04/2008    Dr. Dessie Coma  . Coronary artery bypass graft  Feb. 2005    4 vessel    Outpatient Encounter Prescriptions as of 07/28/2014  Medication Sig  . aspirin 81 MG tablet Take 81 mg by mouth daily.    [name] atorvastatin (LIPITOR) 20 MG tablet Take 0.5 tablets (10 mg total) by mouth daily. Take as directed  . Cholecalciferol (VITAMIN D) 2000 UNITS CAPS Take 1 capsule by mouth daily.    . clopidogrel (PLAVIX) 75 MG tablet TAKE 1 TABLET BY MOUTH EVERY DAY  . hydrochlorothiazide (MICROZIDE) 12.5 MG capsule Take 1 capsule (12.5 mg total) by mouth daily.  . nitroGLYCERIN (NITROSTAT) 0.4 MG SL tablet Place 1 tablet (0.4 mg total) under the tongue every 5 (five) minutes as needed for chest pain.  . valsartan (DIOVAN) 160 MG tablet TAKE 1 TABLET EVERY DAY  . [DISCONTINUED] sildenafil (REVATIO) 20 MG tablet 2-5 tablets as needed for sexual activity    Allergies  Allergen Reactions  . Niacin Other (See Comments)    REACTION: intol to NIACIN w/ headaches    Current Medications, Allergies, Past Medical History, Past Surgical History, Family History, and Social History were reviewed in [employer] record.    Review of Systems         See HPI - all other systems neg except as noted... The patient denies anorexia, fever, weight loss, weight gain, vision loss, decreased hearing, hoarseness, chest pain, syncope, dyspnea on  exertion, peripheral edema, prolonged cough, headaches, hemoptysis, abdominal pain, melena, hematochezia, severe indigestion/heartburn, hematuria, incontinence, muscle weakness, suspicious skin lesions, transient blindness, difficulty walking, depression, unusual weight change, abnormal bleeding, enlarged lymph nodes, and angioedema.     Objective:  Physical Exam    WD, Overweight, 73 y/o WM in NAD... Vital Signs:  Reviewed> GENERAL:  Alert & oriented; pleasant & cooperative. HEENT:  [place]/AT, EOM-wnl, PERRLA, Fundi-benign, EACs-clear, TMs-wnl, NOSE-clear, THROAT-clear & wnl. NECK:  Supple w/ fairROM; no JVD; normal carotid impulses w/o bruits; no thyromegaly or nodules palpated; no lymphadenopathy. CHEST:  Clear to P & A; without wheezes/ rales/ or rhonchi. HEART:  Median sternotomy scar w/ improved keloid, regular rhythm; without murmurs/ rubs/ or gallops. ABDOMEN:  Soft & nontender; normal bowel sounds; no organomegaly or masses detected. 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 EXT: without deformities, mild arthritic changes; + venous insuffic changes, no edema. NEURO:  CN's intact; motor testing normal; sensory testing normal; gait normal & balance OK. DERM:  Keloid as noted, no other lesions...  RADIOLOGY DATA:  Reviewed in the EPIC EMR & discussed w/ the patient...  LABORATORY DATA:  Reviewed in the EPIC EMR & discussed w/ the patient...   Assessment & Plan:    [name]  On Diovan160 & HCT 12.5 w/ improved/stable BP; needs to lose wt & elim sodium...  CEREBROVASC DIS/ TIA>  No further cerebral ischemic symptoms on the ASA/ Plavix; f/u CDoppler w/ 60-79% RICAstenosis & [name] plans 68mo ROV.  CHOL>  On Lip10 & stable; needs better low fat diet & get wt down; DrNishan wanted in a drug study...  DM> adeq control w/ diet alone but he understands the importance of wt reduction.  GI>  He had colonoscopy f/u 2/13 & it was neg...  Other medical problems as  noted...   Patient's Medications  New Prescriptions   No medications on file  Previous Medications   ASPIRIN 81 MG TABLET    Take 81 mg by mouth daily.     ATORVASTATIN (LIPITOR) 20 MG TABLET    Take 0.5 tablets (10 mg total) by mouth daily. Take as directed   CHOLECALCIFEROL (VITAMIN D) 2000 UNITS CAPS    Take 1 capsule by mouth daily.     CLOPIDOGREL (PLAVIX) 75 MG TABLET    TAKE 1 TABLET BY MOUTH EVERY DAY   HYDROCHLOROTHIAZIDE (MICROZIDE) 12.5 MG CAPSULE    Take 1 capsule (12.5 mg total) by mouth daily.   NITROGLYCERIN (NITROSTAT) 0.4 MG SL TABLET    Place 1 tablet (0.4 mg total) under the tongue every 5 (five) minutes as needed for chest pain.   VALSARTAN (DIOVAN) 160 MG TABLET    TAKE 1 TABLET EVERY DAY  Modified Medications   No medications on file  Discontinued Medications   SILDENAFIL (REVATIO) 20 MG TABLET    2-5 tablets as needed for sexual activity

## 2014-07-28 NOTE — Patient Instructions (Signed)
Today we updated your med list in our EPIC system...    Continue your current medications the same...  Today we reviewed your recent fasting blood work & gave you a copy for your records...  We also did a follow up CXR...    We will contact you w/ the results when available...   Keep up the good work w/ diet & exercise... Continue to work on wt reduction...  congrats on stopping the tobacco snuff...  Call for any questions...  Let's plan a follow up visit in 77mo, sooner if needed for problems.Marland KitchenMarland Kitchen

## 2014-08-23 ENCOUNTER — Encounter: Payer: Self-pay | Admitting: Cardiovascular Disease

## 2014-08-23 ENCOUNTER — Ambulatory Visit (INDEPENDENT_AMBULATORY_CARE_PROVIDER_SITE_OTHER): Payer: Medicare Other | Admitting: Cardiovascular Disease

## 2014-08-23 VITALS — BP 136/70 | HR 62 | Ht 74.0 in | Wt 224.8 lb

## 2014-08-23 DIAGNOSIS — I872 Venous insufficiency (chronic) (peripheral): Secondary | ICD-10-CM

## 2014-08-23 DIAGNOSIS — I679 Cerebrovascular disease, unspecified: Secondary | ICD-10-CM

## 2014-08-23 DIAGNOSIS — I10 Essential (primary) hypertension: Secondary | ICD-10-CM

## 2014-08-23 DIAGNOSIS — I2510 Atherosclerotic heart disease of native coronary artery without angina pectoris: Secondary | ICD-10-CM

## 2014-08-23 NOTE — Assessment & Plan Note (Signed)
Left ICA 60-79% stenosis.  No TIA symptoms.  Continue antiplatelet Rx and F/U carotid duplex in 6 months  F/u Dr Donnetta Hutching

## 2014-08-23 NOTE — Assessment & Plan Note (Signed)
Stable with no angina and good activity level.  Continue medical Rx  

## 2014-08-23 NOTE — Assessment & Plan Note (Signed)
Well controlled.  Continue current medications and low sodium Dash type diet.    

## 2014-08-23 NOTE — Progress Notes (Signed)
Patient ID: [name] [name], male   DOB: [birth year], 73 y.o.   MRN: [id number] [place] seen today in followup. His known coronary disease with previous coronary bypass surgery in 2005. he has normal LV function. His risk factors are well modified Not having significant chest pain PND orthopnea is really active. He has 3 young grandchildren and is quite busy with them. He sees Dr. Lenna Gilford for his primary care needs. He had a TIA 2010 with mild facial droop for about 3 minutes. W/U negative. On Plavix. BP a little high. Norvasc caused swelling of feet and stopped   Last carotid 7/15  [facility] Dr Early showed 60-79% RICA and 01-09% LICA. 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  All other systems reviewed and negative  General: Affect appropriate Healthy:  appears stated age 73: normal Neck supple with no adenopathy JVP normal right  bruits no thyromegaly Lungs clear with no wheezing and good diaphragmatic motion Heart:  S1/S2 no murmur, no rub, gallop or click PMI normal Abdomen: benighn, BS positve, no tenderness, no AAA no bruit.  No HSM or HJR Distal pulses intact with no bruits No edema Neuro non-focal Skin warm and dry No muscular weakness  Right radial harvest from CABG    Current Outpatient Prescriptions  Medication Sig Dispense Refill  . aspirin 81 MG tablet Take 81 mg by mouth daily.        [name] atorvastatin (LIPITOR) 20 MG tablet Take 0.5 tablets (10 mg total) by mouth daily. Take as directed  30 tablet  11  . Cholecalciferol (VITAMIN D) 2000 UNITS CAPS Take 1 capsule by mouth daily.        . clopidogrel (PLAVIX) 75 MG tablet TAKE 1 TABLET BY MOUTH EVERY DAY  30 tablet  2  .  hydrochlorothiazide (MICROZIDE) 12.5 MG capsule Take 1 capsule (12.5 mg total) by mouth daily.  90 capsule  1  . nitroGLYCERIN (NITROSTAT) 0.4 MG SL tablet Place 1 tablet (0.4 mg total) under the tongue every 5 (five) minutes as needed for chest pain.  25 tablet  3  . valsartan (DIOVAN) 160 MG tablet TAKE 1 TABLET EVERY DAY  30 tablet  6   No current facility-administered medications for this visit.    Allergies  Niacin  Electrocardiogram:  SR rate 77 nonspecific ST T wave changes   Assessment and Plan

## 2014-08-23 NOTE — Patient Instructions (Signed)
Your physician wants you to follow-up in: YEAR WITH DR NISHAN  You will receive a reminder letter in the mail two months in advance. If you don't receive a letter, please call our office to schedule the follow-up appointment.  Your physician recommends that you continue on your current medications as directed. Please refer to the Current Medication list given to you today. 

## 2014-08-23 NOTE — Addendum Note (Signed)
Addended by: Devra Dopp E on: 08/23/2014 09:51 AM   Modules accepted: Orders

## 2014-08-23 NOTE — Assessment & Plan Note (Signed)
Chronic no issues no history of DVT  Support stockings PRN

## 2014-09-25 ENCOUNTER — Other Ambulatory Visit: Payer: Self-pay | Admitting: Pulmonary Disease

## 2014-09-27 ENCOUNTER — Other Ambulatory Visit: Payer: Self-pay | Admitting: Pulmonary Disease

## 2014-11-03 ENCOUNTER — Other Ambulatory Visit: Payer: Self-pay | Admitting: Cardiovascular Disease

## 2014-11-03 MED ORDER — VALSARTAN 160 MG PO TABS: ORAL_TABLET | ORAL | Status: DC

## 2014-12-25 ENCOUNTER — Other Ambulatory Visit: Payer: Self-pay | Admitting: Cardiovascular Disease

## 2014-12-27 ENCOUNTER — Other Ambulatory Visit: Payer: Self-pay | Admitting: Pulmonary Disease

## 2015-01-03 ENCOUNTER — Other Ambulatory Visit: Payer: Self-pay | Admitting: Dermatology

## 2015-01-24 ENCOUNTER — Encounter: Payer: Self-pay | Admitting: Vascular Surgery

## 2015-01-25 ENCOUNTER — Encounter: Payer: Self-pay | Admitting: Family

## 2015-01-25 ENCOUNTER — Ambulatory Visit (HOSPITAL_COMMUNITY)
Admission: RE | Admit: 2015-01-25 | Discharge: 2015-01-25 | Disposition: A | Discharge status: Home / Self Care | Payer: Medicare Other | Source: Ambulatory Visit | Attending: Family | Admitting: Family

## 2015-01-25 ENCOUNTER — Ambulatory Visit (INDEPENDENT_AMBULATORY_CARE_PROVIDER_SITE_OTHER): Payer: Medicare Other | Admitting: Family

## 2015-01-25 VITALS — BP 141/75 | HR 71 | Resp 16 | Ht 74.0 in | Wt 224.0 lb

## 2015-01-25 DIAGNOSIS — Z87891 Personal history of nicotine dependence: Secondary | ICD-10-CM | POA: Insufficient documentation

## 2015-01-25 DIAGNOSIS — I6523 Occlusion and stenosis of bilateral carotid arteries: Secondary | ICD-10-CM | POA: Diagnosis not present

## 2015-01-25 DIAGNOSIS — Z48812 Encounter for surgical aftercare following surgery on the circulatory system: Secondary | ICD-10-CM | POA: Diagnosis not present

## 2015-01-25 NOTE — Progress Notes (Signed)
Established Carotid Patient   History of Present Illness  [name] [name] is a 74 y.o. male patient of Dr. Donnetta Hutching who has known carotid stenosis, returns today for carotid arteries surveillance.  He has not had previous carotid artery intervention.  In June of 2011 or 2012 he experienced mild left facial drooping and numbness that lasted 3-4 minutes, was evaluated at [facility] ED, states CT of his head did not show a stroke; states he has had not had any further TIA or stroke symptoms.  He states that his blood pressure usually runs 125-135/65-70.  He admits to not getting enough exercise.   He denies claudication symptoms.  The patient denies amaurosis fugax or monocular blindness. Pt. denies hemiplegia. The patient denies receptive or expressive aphasia. Pt. denies extremity weakness.  Patient denies New Medical or Surgical History.  He reports his granddaughter is going through some medical issues which is a source of stress for him.  Pt Diabetic: No, Review of records: 6.2 A1C in July 2015, but states he does not have DM  Pt smoker: non-smoker, he stopped smokeless tobacco use in August, 2015  Pt meds include:  Statin : Yes  ASA: Yes  Other anticoagulants/antiplatelets: Plavix  Past Medical History  Diagnosis Date  . Hypertension   . Coronary artery disease   . Cerebrovascular disease   . Renal insufficiency   . Hypercholesterolemia   . Diabetes mellitus   . Overweight(278.02)   . Personal history of colonic polyps 10/08/2006    tubular adenomas  . Shoulder pain   . Vitamin D deficiency   . Gynecomastia   . Transient ischemic attack   . Keloid   . Neurodermatitis   . Carotid artery occlusion     Social History History  Substance Use Topics  . Smoking status: Never Smoker   . Smokeless tobacco: Former Systems developer    Types: Chew    Quit date: 07/11/2014     Comment: uses dip 3-4 times per week  . Alcohol Use: No    Family History Family History  Problem  Relation Age of Onset  . Colon cancer Neg Hx   . Lung cancer Paternal Uncle     questionable as to if it was lung ca  . Heart disease Mother     Before age 72  . Diabetes Mother   . Kidney disease Mother   . Heart attack Mother   . Cancer Father     Lung  . Diabetes Brother   . Heart disease Brother   . Diabetes Sister     Surgical History Past Surgical History  Procedure Laterality Date  . Pilonidial cystectomy  1989  . Keloid surgery on chest scar  04/2008    Dr. Dessie Coma  . 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 Niacin Other (See Comments)    REACTION: intol to NIACIN w/ headaches    Current Outpatient Prescriptions  Medication Sig Dispense Refill  . aspirin 81 MG tablet Take 81 mg by mouth daily.      [name] atorvastatin (LIPITOR) 20 MG tablet Take 0.5 tablets (10 mg total) by mouth daily. Take as directed 30 tablet 11  . Cholecalciferol (VITAMIN D) 2000 UNITS CAPS Take 1 capsule by mouth daily.     . clopidogrel (PLAVIX) 75 MG tablet TAKE 1 TABLET BY MOUTH EVERY DAY 30 tablet 2  . hydrochlorothiazide (MICROZIDE) 12.5 MG capsule TAKE 1 CAPSULE (  12.5 MG TOTAL) BY MOUTH DAILY. 90 capsule 1  . nitroGLYCERIN (NITROSTAT) 0.4 MG SL tablet Place 1 tablet (0.4 mg total) under the tongue every 5 (five) minutes as needed for chest pain. 25 tablet 3  . valsartan (DIOVAN) 160 MG tablet TAKE 1 TABLET EVERY DAY 30 tablet 6  . atorvastatin (LIPITOR) 20 MG tablet TAKE 1 TABLET BY MOUTH EVERY DAY (Patient not taking: Reported on 01/25/2015) 30 tablet 4   No current facility-administered medications for this visit.    Review of Systems : See HPI for pertinent positives and negatives.  Physical Examination  Filed Vitals:   01/25/15 1005 01/25/15 1008  BP: 145/76 141/75  Pulse: 71 71  Resp:  16  Height:  6\' 2"  (1.88 m)  Weight:  224 lb (101.606 kg)  SpO2:  98%   Body mass index is 28.75 kg/(m^2).  General: WDWN male in NAD   GAIT: normal  Eyes: PERRLA  Pulmonary: CTAB, Negative Rales, Negative rhonchi, & Negative wheezing. Occasional dry cough.  Cardiac: regular Rhythm, no detected murmur.  VASCULAR EXAM  Carotid Bruits  Left  Right    Negative  Negative   Aorta is not palpable Radial pulses are 2+ palpable and equal.   LE Pulses  LEFT  RIGHT   POPLITEAL  Not palpable  Not palpable   POSTERIOR TIBIAL  palpable  palpable   DORSALIS PEDIS  ANTERIOR TIBIAL  not palpable  not palpable    Gastrointestinal: soft, nontender, BS WNL, no r/g, no palpable masses.  Musculoskeletal: Negative muscle atrophy/wasting. M/S 5/5 throughout, Extremities without ischemic changes.  Neurologic: A&O X 3; Appropriate Affect ; SENSATION ;normal;  Speech is normal  CN 2-12 intact, Pain and light touch intact in extremities, Motor exam as listed above.  Non-Invasive Vascular Imaging CAROTID DUPLEX 01/25/2015   CEREBROVASCULAR DUPLEX EVALUATION    INDICATION: Carotid artery disease     PREVIOUS INTERVENTION(S):     DUPLEX EXAM:     RIGHT  LEFT  Peak Systolic Velocities (cm/s) End Diastolic Velocities (cm/s) Plaque LOCATION Peak Systolic Velocities (cm/s) End Diastolic Velocities (cm/s) Plaque  65 10  CCA PROXIMAL 92 12   81 14 HT CCA MID 104 14 HT  89 12 HT CCA DISTAL 95 12 HT  133 11 HT ECA 101 9 HT  63 16 CP ICA PROXIMAL 134 31 CP  376 95  ICA MID 160 29   113 26  ICA DISTAL 158 31     4.64 ICA / CCA Ratio (PSV) 1.53  Antegrade  Vertebral Flow Antegrade   175 Brachial Systolic Pressure (mmHg) 102  Triphasic  Brachial Artery Waveforms Triphasic     Plaque Morphology:  HM = Homogeneous, HT = Heterogeneous, CP = Calcific Plaque, SP = Smooth Plaque, IP = Irregular Plaque     ADDITIONAL FINDINGS:     IMPRESSION: Right internal carotid artery velocities suggest a 60-79% stenosis (high end of range).  Left internal carotid artery velocities suggest a <40% stenosis (high end of range).      Compared to the previous exam:  No significant change in comparison to the last exam on 07/23/2014.      Assessment: [name] [name] is a 74 y.o. male who presents with asymptomatic 60-79% right ICA stenosis (high end of range) and <40% left ICA stenosis (high end of range).  No significant change in comparison to the last exam on 07/23/2014.  Plan: Follow-up in 6 months with Carotid Duplex.   I discussed  in depth with the patient the nature of atherosclerosis, and emphasized the importance of maximal medical management including strict control of blood pressure, blood glucose, and lipid levels, obtaining regular exercise, and continued cessation of smoking.  The patient is aware that without maximal medical management the underlying atherosclerotic disease process will progress, limiting the benefit of any interventions. The patient was given information about stroke prevention and what symptoms should prompt the patient to seek immediate medical care. Thank you for allowing [place] to participate in this patient's care.  Clemon Chambers, RN, MSN, FNP-C [facility] Office: [phone]  Clinic Physician: Early  01/25/2015  10:12 AM

## 2015-01-25 NOTE — Patient Instructions (Signed)
Stroke Prevention Some medical conditions and behaviors are associated with an increased chance of having a stroke. You may prevent a stroke by making healthy choices and managing medical conditions. HOW CAN I REDUCE MY RISK OF HAVING A STROKE?   Stay physically active. Get at least 30 minutes of activity on most or all days.  Do not smoke. It may also be helpful to avoid exposure to secondhand smoke.  Limit alcohol use. Moderate alcohol use is considered to be:  No more than 2 drinks per day for men.  No more than 1 drink per day for nonpregnant women.  Eat healthy foods. This involves:  Eating 5 or more servings of fruits and vegetables a day.  Making dietary changes that address high blood pressure (hypertension), high cholesterol, diabetes, or obesity.  Manage your cholesterol levels.  Making food choices that are high in fiber and low in saturated fat, trans fat, and cholesterol may control cholesterol levels.  Take any prescribed medicines to control cholesterol as directed by your health care provider.  Manage your diabetes.  Controlling your carbohydrate and sugar intake is recommended to manage diabetes.  Take any prescribed medicines to control diabetes as directed by your health care provider.  Control your hypertension.  Making food choices that are low in salt (sodium), saturated fat, trans fat, and cholesterol is recommended to manage hypertension.  Take any prescribed medicines to control hypertension as directed by your health care provider.  Maintain a healthy weight.  Reducing calorie intake and making food choices that are low in sodium, saturated fat, trans fat, and cholesterol are recommended to manage weight.  Stop drug abuse.  Avoid taking birth control pills.  Talk to your health care provider about the risks of taking birth control pills if you are over 35 years old, smoke, get migraines, or have ever had a blood clot.  Get evaluated for sleep  disorders (sleep apnea).  Talk to your health care provider about getting a sleep evaluation if you snore a lot or have excessive sleepiness.  Take medicines only as directed by your health care provider.  For some people, aspirin or blood thinners (anticoagulants) are helpful in reducing the risk of forming abnormal blood clots that can lead to stroke. If you have the irregular heart rhythm of atrial fibrillation, you should be on a blood thinner unless there is a good reason you cannot take them.  Understand all your medicine instructions.  Make sure that other conditions (such as anemia or atherosclerosis) are addressed. SEEK IMMEDIATE MEDICAL CARE IF:   You have sudden weakness or numbness of the face, arm, or leg, especially on one side of the body.  Your face or eyelid droops to one side.  You have sudden confusion.  You have trouble speaking (aphasia) or understanding.  You have sudden trouble seeing in one or both eyes.  You have sudden trouble walking.  You have dizziness.  You have a loss of balance or coordination.  You have a sudden, severe headache with no known cause.  You have new chest pain or an irregular heartbeat. Any of these symptoms may represent a serious problem that is an emergency. Do not wait to see if the symptoms will go away. Get medical help at once. Call your local emergency services (911 in [place] Do not drive yourself to the hospital. Document Released: 01/24/2005 Document Revised: 05/03/2014 Document Reviewed: 06/19/2013 [employer] Patient Information 2015 [employer]. This information is not intended to replace advice given   to you by your health care provider. Make sure you discuss any questions you have with your health care provider.  

## 2015-01-28 ENCOUNTER — Ambulatory Visit (INDEPENDENT_AMBULATORY_CARE_PROVIDER_SITE_OTHER): Payer: Medicare Other | Admitting: Pulmonary Disease

## 2015-01-28 ENCOUNTER — Encounter: Payer: Self-pay | Admitting: Pulmonary Disease

## 2015-01-28 VITALS — BP 130/66 | HR 65 | Temp 97.2°F | Ht 74.0 in | Wt 223.5 lb

## 2015-01-28 DIAGNOSIS — I739 Peripheral vascular disease, unspecified: Secondary | ICD-10-CM

## 2015-01-28 DIAGNOSIS — E7800 Pure hypercholesterolemia, unspecified: Secondary | ICD-10-CM

## 2015-01-28 DIAGNOSIS — I779 Disorder of arteries and arterioles, unspecified: Secondary | ICD-10-CM

## 2015-01-28 DIAGNOSIS — F419 Anxiety disorder, unspecified: Secondary | ICD-10-CM

## 2015-01-28 DIAGNOSIS — I10 Essential (primary) hypertension: Secondary | ICD-10-CM

## 2015-01-28 DIAGNOSIS — I2510 Atherosclerotic heart disease of native coronary artery without angina pectoris: Secondary | ICD-10-CM

## 2015-01-28 DIAGNOSIS — R7301 Impaired fasting glucose: Secondary | ICD-10-CM

## 2015-01-28 DIAGNOSIS — Z23 Encounter for immunization: Secondary | ICD-10-CM

## 2015-01-28 NOTE — Patient Instructions (Signed)
Today we updated your med list in our EPIC system...    Continue your current medications the same...  Today we gave you the 2015 flu vaccine...  Call for any questions...  Let's plan a follow up visit in 6 months w/ FASTING blood work at that time.Marland KitchenMarland Kitchen

## 2015-01-28 NOTE — Progress Notes (Signed)
Subjective:    Patient ID: [name] [name], male    DOB: Aug 20, 1941, 74 y.o.   MRN: [id number]  HPI 74 y/o WM here for a follow up visit... he has multiple medical problems as noted below...  SEE PREV EPIC NOTES FOR EARLIER DATA>>  ~  January 02, 2013:  72mo ROV & [name] says he is doing well- no new complaints or concerns; We reviewed the following medical problems during today's office visit>>     [name] on Diovan160 & HCT-12.5 (intol to BBlockers in past); BP= 142/80 & he says better at home; denies CP, palpit, SOB, edema, etc...    CAD> s/p 4 vessel CABG 2005, on ASA/ Plavix; he saw [facility] 12/13 for yearly check up- stable & no changes made...    Cerebrovasc Dis> on ASA/ Plavix; followed by [name] & seen 1/14 w/ f/u CDopplers stable w/ 60-79% right ICA stenosis, they are checking Q16mo...    CHOL> on Lip20- taking 1/2 tab daily; last FLP 6/13 showed TChol 127, TG 164, HDL 27, LDL 67; he is intol Niacin & rec to incr exercise program & get wt down...    Overweight> weight= 231# unchanged; we reviewed diet, exercise, & wt loss program...    Hx colon polyps> f/u colonoscopy 2/13 by [name]- neg, no polyps & he rec f/u in 72yrs...    DJD/ Vit D defic> he uses OTC analgesics as needed; continues on Vit D 2000u daily...    Hx TIA> SEE 6/10 Hosp> sm vessel dis, remote lacunar infarcts, intracranial atherosclerotic changes on MRA; CDopplers followed by [name] as above... We reviewed prob list, meds, xrays and labs> see below for updates >>   ~  July 02, 2013:  72mop ROV & Steph mentions "2 things"> 1) notes occas fecal urgency- it happened x2, just HAD to go, no leakage, and asked to incr fiber, metamucil, probiotics, etc... 2) BP ave 125-135/ 60-70 at home; measures 140/70 today; he thinks side effect from Diovan- specifically he stumbles if he changes directions while walking; discussed w/ pt & asked to decr the Diovan160 to 1/2 tab first & report how he's doing before we consider stopping this  med...     [name] on Diovan160 & HCT-12.5 (intol to BBlockers in past); BP= 140/70 & he says better at home; denies CP, palpit, SOB, edema, etc...    CAD> s/p 4 vessel CABG 2005, on ASA/ Plavix; he saw [facility] 12/13 for yearly check up- stable & no changes made...    Cerebrovasc Dis> on ASA/ Plavix; followed by [name] & seen 1/14 w/ f/u CDopplers stable w/ 60-79% right ICA stenosis, they are checking Q72mo...    CHOL> on Lip20- taking 1/2 tab daily; FLP 6/14 shows TChol 126, TG 155, HDL 32, LDL 63; he is intol Niacin & rec to incr exercise program & get wt down...    Overweight> weight= 232# unchanged; we reviewed diet, exercise, & wt loss program...    Hx colon polyps> f/u colonoscopy 2/13 by [name]- neg, no polyps & he rec f/u in 24yrs...    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    DJD/ Vit D defic> he uses OTC analgesics as needed; continues on Vit D 2000u daily w/ last VitD level 6/13= 34...    Hx TIA> SEE 6/10 Hosp> sm vessel dis, remote lacunar infarcts, intracranial atherosclerotic changes on MRA; CDopplers followed by [name] as above w/ 60-79% RICAstenosis...    Keloid> in lower sternal scar w/ plastic repair in  past... We reviewed prob list, meds, xrays and labs> see below for updates >>   LABS 6/14:  FLP- on Lip10 w/ TChol/LDL ok but TG/HDL still off;  Chems- ok x BS=136, A1c=6.4;  CBC- wnl;  TSH=2.66;  PSA=1.65;  CRP done at pt request=0.5.Marland KitchenMarland KitchenMarland Kitchen  ~  January 22, 2014:  72-72mo Justice claims that "1wk ago I changed my lifestyle"- going to the gym everyday, stopped all tobacco (snuff), etc;  He had a URI about 3wks ago- better now after OTC rx;  His CC is ED & he reports no benefit from Sildenafil- we discussed refer to Urology to discuss alternatives... We reviewed the following medical problems during today's office visit >>     [name] on Diovan160 & HCT-12.5 (intol to BBlockers in past); BP= 130/60 & he says good at home too; denies CP, palpit, SOB, edema, etc...    CAD> s/p 4 vessel  CABG 2005, on ASA/ Plavix; he saw [name] 8/14 for yearly check up- stable & no changes made...    Cerebrovasc Dis> on ASA/ Plavix; followed by [name] & seen 1/15 w/ f/u CDopplers stable w/ 60-79% right ICA stenosis, they are checking Q45mo...    CHOL> on Lip20- taking 1/2 tab daily; FLP 6/14 showed TChol 126, TG 155, HDL 32, LDL 63; he is intol Niacin & rec to incr exercise program & get wt down...    Overweight> weight= 230# unchanged; we reviewed diet, exercise, & wt loss program...    Hx colon polyps> f/u colonoscopy 2/13 by [name]- neg, no polyps & he rec f/u in 72yrs...    GU> he reports no benefit from Sildenafil & requests Urology appt to discuss additional alternatives...    DJD/ Vit D defic> he uses OTC analgesics as needed; continues on Vit D 2000u daily w/ last VitD level 6/13= 34...    Hx TIA> SEE 6/10 Hosp> sm vessel dis, remote lacunar infarcts, intracranial atherosclerotic changes on MRA; CDopplers followed by [name] as above w/ 60-79% RICAstenosis; on ASA/Plavix...    Keloid> in lower sternal scar w/ plastic repair in past... We reviewed prob list, meds, xrays and labs> see below for updates >> he had the 2014 Flu vaccine in Dec...   ~  July 28, 2014:  19mo ROV & [name] reports doing well, no new complaints or concerns; he reports that he has quit all tobacco products & last use was ~1wk ago (prev admits to 1 can/d), he credits his little grandson; his wt is down 6#- going to the gym vs walking several days per wk but still NOT on much of a diet... We reviewed the following medical problems during today's office visit >>     [name] on Diovan160 & HCT-12.5 (intol to BBlockers in past); BP= 132/62 & he says good at home too; denies CP, palpit, SOB, edema, etc...    CAD> s/p 4 vessel CABG 2005, on ASA/ Plavix; he saw [name] 8/14 for yearly check up- stable & no changes made...    Cerebrovasc Dis> on ASA/ Plavix; followed by [name] & seen 7/15 w/ f/u CDopplers stable w/ 60-79% right  ICA stenosis, they are checking Q69mo...    CHOL> on Lip10- taking 1/2 of 20mg  tab daily; FLP 7/15 showed TChol 118, TG 166, HDL 29, LDL 56; he is intol Niacin & rec to incr exercise program & get wt down...    Overweight> weight= 224# which is down 6#; we reviewed diet, exercise, & wt loss program...    Hx  colon polyps> f/u colonoscopy 2/13 by [name]- neg, no polyps & he rec f/u in 61yrs...    GU> he reports no benefit from Sildenafil & prev requested Urology appt to discuss additional alternatives but he never went...    DJD/ Vit D defic> he uses OTC analgesics as needed; continues on Vit D 2000u daily w/ last VitD level 6/13= 34...    Hx TIA> SEE 6/10 Hosp> sm vessel dis, remote lacunar infarcts, intracranial atherosclerotic changes on MRA; CDopplers followed by [name] as above w/ 60-79% RICAstenosis; on ASA/Plavix...    Keloid> in lower sternal scar w/ plastic repair in past... We reviewed prob list, meds, xrays and labs> see below for updates >>   CXR 7/15 showed cardiomeg, no change in contour of mediastinum compared to mult old films, clear lungs, NAD...   CDopplers 7/15 showed 60-79% RICAstenosis & <78% LICAstenosis (no change)...   LABS 7/15:  FLP- TChol&LDL ok on Lip10 but TG incr & HDL too low;  Chems- ok w/ BS=115A1c=6.2;  CBC- wnl;  TSH=2.51;  PSA=1.78...   ~  January 28, 2015:  81mo ROV & Alby reports a good interval- no new medical complaints or concerns; he has however been under a lot of stress regarding his 21y/o grand daughter (Hx obesity >300#, subseq gastric bypass in [place], now Dx w/ anorexia, & she was the victim of a sexual assault); he has also had 4 friends pass away over the last month, mult funerals etc;  We discussed these issues 7 offered anxiolytic but he declines 7 he seems o be handling it all as well as can be expected, he will call if he feels he needs Rx...     [name] on Diovan160 & HCT 12.5; Norvasc caused swelling; BP= 130/66 & feeling well w/o CP,  palpit, dizzy, SOB, edema...    He saw [name] for Cards 8/15> known CAD & prev CABG 2005, normal LVF, on ASA/Plavix; stable on meds and no changes made...     He saw [facility] for f/u Carotid stenosis 1/16> he remains on ASA/Plavix & asymptomatic w/o signs of cerebral ischemia; f/u CDoppler 1/16 (no bruits) showed stable 60-79% right ICAstenosis & left <40% ICAstenosis (f/u Q37mo).    Chol> well controlled on Lip20; he is intol to Niacin... We reviewed prob list, meds, xrays and labs> see below for updates >> Given 2015 flu vaccine today...           Problem List:    HYPERTENSION (ICD-401.9) >> ~  6/09:  prev on Atenolol25mg - stopped due to side effects & feels much better off BBlockers. ~  2/10:  Avapro300 changed to Diovan160 per his insurance company for $$$ reasons. ~  11/11:  BP= 150/80 & remains asymptomatic, doesn't want to incr meds- discussed diet/ exerc... ~  5/12:  BP= 132/70 & denies HA, fatigue, visual changes, CP, palipit, dizziness, syncope, dyspnea, edema, etc... ~  1/13:  BP= 152/68 & he remains asymptomatic; we decided to add NORVASC 5mg /d ==> pt stopped on his own due to swelling. ~  6/13:  He saw DrNishan for f/u & BP= 154/80 on Diovan alone; added HCTZ 12.5mg /d... ~  7/13:  BP= 148/82 here & 130s/70s at home he says on DIOVAN160mg /d & HCTZ 12.5mg /d; continue same ~  CXR 7/13 showed cardiomeg, s/p CABG, bronchitic changes at lung bases, NAD... ~  1/14: on Diovan160 & HCT-12.5 (intol to BBlockers in past); BP= 142/80 & he says better at home; denies CP, palpit, SOB, edema, etc...  ~  7/14: on Diovan160 & HCT-12.5 (intol to BBlockers in past); BP= 140/70 & he remains asymptomatic... ~  1/15: on Diovan160 & HCT-12.5; BP= 130/60 & he says good at home too; denies CP, palpit, SOB, edema, etc ~  CXR 7/15 showed cardiomeg, no change in contour of mediastinum compared to mult old films, clear lungs, NAD ~  1/16:  on Diovan160 & HCT 12.5; Norvasc caused swelling; BP= 130/66 & feeling  well w/o CP, palpit, dizzy, SOB, edema.  CORONARY ARTERY DISEASE (ICD-414.00) - S/P 4 vessel CABG 2/05 by DrVanTrigt> on ASA/ PLAVIX & followed by [name] yearly & his notes are reviewed; he is intol to BBlocker therapy as noted... ~  1/13 & 7/13:  stable & denies angina, palpit, SOB, edema, etc; EKG showed NSR, rate71, WNL, NAD.[name].  ~  8/14: he had yearly f/u DrNishan> CAD, s/pCABG in 2005, norm LVF; EKG showed NSR, rate77, NSSTTWA, NAD; stable, rec to incr exercise & lose wt; no change in meds...  ~  8/15: He saw [name] for yearly Cards visit> known CAD & prev CABG 2005, normal LVF, on ASA/Plavix; stable on meds and no changes made  CEREBROVASCULAR DISEASE (ICD-437.9) - see above> on ASA 81mg /d, & PLAVIX 75mg /d... followed by Sharlot Gowda for [facility]. ~  6/10:  See 6/10 Hosp records & f/u by [name] in his office... ~  6/11:  seen by [name] & stable w/o TIAs or amaurosis; CDopplers were stable w/ bilat 40-59% ICA stenoses, f/u 48yr. ~  He was due for a follow up eval 6/12> we don't have records & will call for the report... ~  6/13:  He saw [name] for f/u asymptomatic carotid dis> CDoppler 6/13 showed some progression of right sided stenosis (now 60-79%) but denies cerebral ischemic symptoms (right side remains 40-59%); continue ASA/Plavix & f/u planned 23mo. ~  1/14:  f/u CDopplers were stable w/ 60-79% right ICA stenosis & 20-39% left ICA stenosis & f/u planned in 24mo... ~  7/14:  f/u CDopplers at [facility] showed 60-79% right ICAstenosis and 0-40% left ICAstenosis- f/u Q23mo... ~  1/15:  f/u w/ [facility] for known Carotid stenosis> CDuplex showed 60-79% right carotid stenosis and 40-59% left carotid stenosis; continue ASA/Plavix and lifestyle mod strategies... ~  7/15:  f/u w/ [facility] for known Carotid stenosis> CDuplex unchanges w/ 60-79% RICAstenosis... ~  1/16: He saw [facility] for f/u Carotid stenosis> he remains on ASA/Plavix & asymptomatic w/o signs of cerebral ischemia; f/u CDoppler 1/16 (no bruits) showed stable  60-79% right ICAstenosis & left <40% ICAstenosis (f/u Q48mo).  VENOUS INSUFFICIENCY (ICD-459.81) - he is aware of need to elim salt, elevate legs, wear support hose.  HYPERCHOLESTEROLEMIA (ICD-272.0) - controlled on LIPITOR 20- 1/2 tab daily... he has tried NIACIN in the past and refuses to take it again due to headaches. ~  FLP 5/08 showed TChol 125, TG 171, HDL 24, LDL 67...  ~  [place] 12/08 showed TChol 107, TG 131, HDL 23, LDL 58... rec- same med, better diet & exercise. ~  FLP 2/10 on Lip10 (wt=235#) showed TChol 119, TG 158, HDL 24, LDL 63... rec- ditto ~  [place] 11/10 on Lip10 (wt=236#) showed TChol 115, TG 128, HDL 28, LDL 62 ~  FLP 5/11 on Lip10 (wt=233#) showed TChol 119, TG 144, HDL 27, LDL 63 ~  DrNishan suggested that he cut the Lipitor to 5mg /d & add Niaspan, but he refuses the Niacin preps due to HAs & wants to contin Lip10. ~  FLP 11/11 on Lip10 (wt=235#) showed  TChol 119, TG 113, HDL 31, LDL 66 ~  FLP 5/12 on Lip10 (wt=229#) showed TChol 118, TG 150, HDL 28, LDL 60 ~  FLP 6/13 on Lip10 (wt=232#) showed TChol 127, TG 164, HDL 27, LDL 67... He tells me [name] wanted him in an HDL trial... ~  6/14: on Lip20- taking 1/2 tab daily; FLP 6/14 shows TChol 126, TG 155, HDL 32, LDL 63; he is intol Niacin & rec to incr exercise program & get wt down ~  FLP 7/15 on Lip10 showed TChol 118, TG 166, HDL 29, LDL 56...  DIABETES MELLITUS, BORDERLINE (ICD-790.29) - On diet Rx alone... ~  labs in hosp 6/10 showed BS= 109-113 ~  labs 11/10 showed BS= 109, A1c= 5.9 ~  labs 5/11 showed BS= 107 ~  labs 11/11 showed BS= 108 ~  Labs 5/12 showed BS= 97, A1c= 6.0 ~  Labs 6/13 showed BS=122, A1c= 6.4... Needs better diet & wt reduction. ~  Labs 6/14 showed BS= 136, A1c= 6.4 ~  Labs 7/15 on diet alone showed BS=115, A1c= 6.2  OVERWEIGHT (ICD-278.02) - he is 6'2" tall and weighs ~230# for a BMI of 30... we have discussed diet & exercise stategies. ~  7/15: he has lost 6# down to 224# today...  COLONIC  POLYPS (ICD-211.3) - last colonoscopy 10/07 by [name] showed several 5-6 mm polyps... path= tubular adenomas ... f/u planned 5 years. ~  Colonoscopy 2/13 by [name] was neg- no recurrent polyps etc & he felt that 56yr f/u was appropriate...  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 Rx w/ PT.  VITAMIN D DEFIC >> on Vit D supplement 2000u daily... ~  Labs 2/10 showed Vit D level = 14... rec to start Vit D supplement OTC... ~  Labs 6/13 showed Vit D level = 34... rec to continue his VitD supplement daily...  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 ~  6/10:  Hosp w/ TIA w/ left face & arm symptoms that resolved quickly; CT showed small vessel disease;  MRI showed bilat remote lacunar infarcts, no acute infarct, +sm vessel dis;  MRA showed intracranial atherosclerotic changes, & ?focal stenosis (?70%) of left ICA in the neck; he saw [name] 06/08/09 (no bruits) w/ CDopplers done showing bilat 40-59% carotid stenoses & agreed w/ ASA + Plavix and f/u 68yr...  ~  6/13: saw [name] for f/u asymptomatic carotid dis> CDoppler 6/13 showed some progression of right sided stenosis (now 60-79%) but denies cerebral ischemic symptoms (right side remains 40-59%); continue ASA/Plavix & f/u planned 38mo. ~  1/14: he continues Q35mo f/u w/ [facility]- stable w/o cerebral ischemic symptoms...  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 he had plastic surg by [facility] 5/09 w/ improved scar. ~  He has seb cyst over CSpine & he tells me that DrTafeen is going to remove this soon... ~  He has a small knot/lesion ?cyst over the fibular head on lat side of left lower leg; stable, no discomfort, no change & offered Ortho eval but he prefers to  just watch it for now...  HEALTH MAINTENANCE: ~  GI:  followed by [name] & last colonoscopy was 10/07 w/ f/u planned 2yrs. ~  GU:  he is up to date on DRE &  PSA checks here (PSA remains in the 1-2 range). ~  Immunizations:  he had [place] in 2000 w/ repeat PNEUMOVAX 11/10 & TDAP 5/11... he gets the yearly Seasonal Flu vaccine each fall...   Past Surgical History  Procedure Laterality Date  . Pilonidial cystectomy  1989  . Keloid surgery on chest scar  04/2008    Dr. Dessie Coma  . Coronary artery bypass graft  Feb. 2005    4 vessel    Outpatient Encounter Prescriptions as of 01/28/2015  Medication Sig  . aspirin 81 MG tablet Take 81 mg by mouth daily.    [name] atorvastatin (LIPITOR) 20 MG tablet Take 0.5 tablets (10 mg total) by mouth daily. Take as directed  . Cholecalciferol (VITAMIN D) 2000 UNITS CAPS Take 1 capsule by mouth daily.   . clopidogrel (PLAVIX) 75 MG tablet TAKE 1 TABLET BY MOUTH EVERY DAY  . hydrochlorothiazide (MICROZIDE) 12.5 MG capsule TAKE 1 CAPSULE (12.5 MG TOTAL) BY MOUTH DAILY.  . nitroGLYCERIN (NITROSTAT) 0.4 MG SL tablet Place 1 tablet (0.4 mg total) under the tongue every 5 (five) minutes as needed for chest pain.  . valsartan (DIOVAN) 160 MG tablet TAKE 1 TABLET EVERY DAY  . [DISCONTINUED] atorvastatin (LIPITOR) 20 MG tablet TAKE 1 TABLET BY MOUTH EVERY DAY (Patient not taking: Reported on 01/25/2015)    Allergies  Allergen Reactions  . Niacin Other (See Comments)    REACTION: intol to NIACIN w/ headaches    Current Medications, Allergies, Past Medical History, Past Surgical History, Family History, and Social History were reviewed in [employer] record.    Review of Systems         See HPI - all other systems neg except as noted... The patient denies anorexia, fever, weight loss, weight gain, vision loss, decreased hearing, hoarseness, chest pain, syncope, dyspnea on exertion, peripheral edema, prolonged cough,  headaches, hemoptysis, abdominal pain, melena, hematochezia, severe indigestion/heartburn, hematuria, incontinence, muscle weakness, suspicious skin lesions, transient blindness, difficulty walking, depression, unusual weight change, abnormal bleeding, enlarged lymph nodes, and angioedema.     Objective:   Physical Exam    WD, Overweight, 74 y/o WM in NAD... Vital Signs:  Reviewed> GENERAL:  Alert & oriented; pleasant & cooperative. HEENT:  [place]/AT, EOM-wnl, PERRLA, Fundi-benign, EACs-clear, TMs-wnl, NOSE-clear, THROAT-clear & wnl. NECK:  Supple w/ fairROM; no JVD; normal carotid impulses w/o bruits; no thyromegaly or nodules palpated; no lymphadenopathy. CHEST:  Clear to P & A; without wheezes/ rales/ or rhonchi. HEART:  Median sternotomy scar w/ improved keloid, regular rhythm; without murmurs/ rubs/ or gallops. ABDOMEN:  Soft & nontender; normal bowel sounds; no organomegaly or masses detected. 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 EXT: without deformities, mild arthritic changes; + venous insuffic changes, no edema. NEURO:  CN's intact; motor testing normal; sensory testing normal; gait normal & balance OK. DERM:  Keloid as noted, no other lesions...  RADIOLOGY DATA:  Reviewed in the EPIC EMR & discussed w/ the patient...  LABORATORY DATA:  Reviewed in the EPIC EMR & discussed w/ the patient...   Assessment & Plan:    [name]  On Diovan160 & HCT 12.5 w/ improved/stable BP; needs to lose wt & elim sodium...  CAD, s/p CABG 2005> followed by [name] for Cards, stable & doing satis...  CEREBROVASC DIS/ TIA>  No further cerebral ischemic symptoms on the ASA/ Plavix; f/u CDoppler w/ 60-79% RICAstenosis & [name] plans 43mo ROV.  CHOL>  On Lip10 & stable; needs better  low fat diet & get wt down; DrNishan wanted in a drug study...  Borderline DM/ IFG> adeq control w/ diet alone but he understands the importance of wt reduction.  GI>  He had colonoscopy f/u 2/13 & it was  neg...  Other medical problems as noted...   Patient's Medications  New Prescriptions   No medications on file  Previous Medications   ASPIRIN 81 MG TABLET    Take 81 mg by mouth daily.     ATORVASTATIN (LIPITOR) 20 MG TABLET    Take 0.5 tablets (10 mg total) by mouth daily. Take as directed   CHOLECALCIFEROL (VITAMIN D) 2000 UNITS CAPS    Take 1 capsule by mouth daily.    CLOPIDOGREL (PLAVIX) 75 MG TABLET    TAKE 1 TABLET BY MOUTH EVERY DAY   HYDROCHLOROTHIAZIDE (MICROZIDE) 12.5 MG CAPSULE    TAKE 1 CAPSULE (12.5 MG TOTAL) BY MOUTH DAILY.   NITROGLYCERIN (NITROSTAT) 0.4 MG SL TABLET    Place 1 tablet (0.4 mg total) under the tongue every 5 (five) minutes as needed for chest pain.   VALSARTAN (DIOVAN) 160 MG TABLET    TAKE 1 TABLET EVERY DAY  Modified Medications   No medications on file  Discontinued Medications   ATORVASTATIN (LIPITOR) 20 MG TABLET    TAKE 1 TABLET BY MOUTH EVERY DAY

## 2015-02-24 ENCOUNTER — Other Ambulatory Visit: Payer: Self-pay | Admitting: Dermatology

## 2015-04-04 ENCOUNTER — Other Ambulatory Visit: Payer: Self-pay | Admitting: Pulmonary Disease

## 2015-05-06 ENCOUNTER — Other Ambulatory Visit: Payer: Self-pay | Admitting: Pulmonary Disease

## 2015-05-06 ENCOUNTER — Other Ambulatory Visit: Payer: Self-pay

## 2015-05-06 MED ORDER — CLOPIDOGREL BISULFATE 75 MG PO TABS: 75.0000 mg | ORAL_TABLET | Freq: Every day | ORAL | Status: DC

## 2015-05-06 MED ORDER — VALSARTAN 160 MG PO TABS: ORAL_TABLET | ORAL | Status: DC

## 2015-05-06 NOTE — Telephone Encounter (Signed)
Refill request received from [facility] for a 90 day supply of pt's plavix. Per SN, ok to refil 90 day quantity with no refills. Form faxed back to [facility] with approval.

## 2015-05-18 ENCOUNTER — Encounter: Payer: Self-pay | Admitting: Gastroenterology

## 2015-07-04 ENCOUNTER — Other Ambulatory Visit: Payer: Self-pay | Admitting: Cardiovascular Disease

## 2015-07-05 ENCOUNTER — Other Ambulatory Visit: Payer: Self-pay

## 2015-07-05 MED ORDER — HYDROCHLOROTHIAZIDE 12.5 MG PO CAPS: ORAL_CAPSULE | ORAL | Status: DC

## 2015-07-20 ENCOUNTER — Telehealth: Payer: Self-pay | Admitting: Pulmonary Disease

## 2015-07-20 DIAGNOSIS — F419 Anxiety disorder, unspecified: Secondary | ICD-10-CM

## 2015-07-20 DIAGNOSIS — I10 Essential (primary) hypertension: Secondary | ICD-10-CM

## 2015-07-20 DIAGNOSIS — N400 Enlarged prostate without lower urinary tract symptoms: Secondary | ICD-10-CM

## 2015-07-20 DIAGNOSIS — I2510 Atherosclerotic heart disease of native coronary artery without angina pectoris: Secondary | ICD-10-CM

## 2015-07-20 DIAGNOSIS — R7301 Impaired fasting glucose: Secondary | ICD-10-CM

## 2015-07-20 DIAGNOSIS — E7800 Pure hypercholesterolemia, unspecified: Secondary | ICD-10-CM

## 2015-07-20 NOTE — Telephone Encounter (Signed)
Message printed and placed on  SN cart for review  

## 2015-07-20 NOTE — Telephone Encounter (Signed)
Pt calling to see what labs SN is wanting before pt's appt on 7.29.16. Pt willing to come in before appt to have labs drawn.   Dr. Lenna Gilford please advise. Thanks.

## 2015-07-21 NOTE — Telephone Encounter (Signed)
Per SN,  - Please order FLP, BMET, Hepatic Panel, CBC with diff, TSH, A1C, PSA, and CRP -Notify pt that we are ordering labs   Called and notified pt of the above lab orders Orders placed  Nothing further is needed.

## 2015-07-25 ENCOUNTER — Other Ambulatory Visit (INDEPENDENT_AMBULATORY_CARE_PROVIDER_SITE_OTHER): Payer: Medicare Other

## 2015-07-25 DIAGNOSIS — I2510 Atherosclerotic heart disease of native coronary artery without angina pectoris: Secondary | ICD-10-CM | POA: Diagnosis not present

## 2015-07-25 DIAGNOSIS — N400 Enlarged prostate without lower urinary tract symptoms: Secondary | ICD-10-CM

## 2015-07-25 DIAGNOSIS — E7800 Pure hypercholesterolemia, unspecified: Secondary | ICD-10-CM

## 2015-07-25 DIAGNOSIS — F419 Anxiety disorder, unspecified: Secondary | ICD-10-CM | POA: Diagnosis not present

## 2015-07-25 DIAGNOSIS — R7301 Impaired fasting glucose: Secondary | ICD-10-CM

## 2015-07-25 DIAGNOSIS — I10 Essential (primary) hypertension: Secondary | ICD-10-CM

## 2015-07-25 LAB — C-REACTIVE PROTEIN: CRP: 0.1 mg/dL — ABNORMAL LOW (ref 0.5–20.0)

## 2015-07-25 LAB — BASIC METABOLIC PANEL
BUN: 15 mg/dL (ref 6–23)
CO2: 29 mEq/L (ref 19–32)
Calcium: 9.3 mg/dL (ref 8.4–10.5)
Chloride: 102 mEq/L (ref 96–112)
Creatinine, Ser: 1.07 mg/dL (ref 0.40–1.50)
GFR: 72.26 mL/min (ref >60.00)
Glucose, Bld: 111 mg/dL — ABNORMAL HIGH (ref 70–99)
Potassium: 4.1 mEq/L (ref 3.5–5.1)
Sodium: 142 mEq/L (ref 135–145)

## 2015-07-25 LAB — CBC WITH DIFFERENTIAL/PLATELET
Basophils Absolute: 0 10*3/uL (ref 0.0–0.1)
Basophils Relative: 0.4 % (ref 0.0–3.0)
Eosinophils Absolute: 0.3 10*3/uL (ref 0.0–0.7)
Eosinophils Relative: 3.7 % (ref 0.0–5.0)
HCT: 46.8 % (ref 39.0–52.0)
Hemoglobin: 16 g/dL (ref 13.0–17.0)
Lymphocytes Relative: 18.9 % (ref 12.0–46.0)
Lymphs Abs: 1.4 10*3/uL (ref 0.7–4.0)
MCHC: 34.3 g/dL (ref 30.0–36.0)
MCV: 100.7 fl — ABNORMAL HIGH (ref 78.0–100.0)
Monocytes Absolute: 0.6 10*3/uL (ref 0.1–1.0)
Monocytes Relative: 8.3 % (ref 3.0–12.0)
Neutro Abs: 5 10*3/uL (ref 1.4–7.7)
Neutrophils Relative %: 68.7 % (ref 43.0–77.0)
Platelets: 187 10*3/uL (ref 150.0–400.0)
RBC: 4.65 Mil/uL (ref 4.22–5.81)
RDW: 14.3 % (ref 11.5–15.5)
WBC: 7.3 10*3/uL (ref 4.0–10.5)

## 2015-07-25 LAB — LIPID PANEL
Cholesterol: 129 mg/dL (ref 0–200)
HDL: 30.8 mg/dL — ABNORMAL LOW (ref >39.00)
LDL Cholesterol: 62 mg/dL (ref 0–99)
NonHDL: 98.2
Total CHOL/HDL Ratio: 4
Triglycerides: 182 mg/dL — ABNORMAL HIGH (ref 0.0–149.0)
VLDL: 36.4 mg/dL (ref 0.0–40.0)

## 2015-07-25 LAB — HEPATIC FUNCTION PANEL
ALT: 17 U/L (ref 0–53)
AST: 20 U/L (ref 0–37)
Albumin: 4.1 g/dL (ref 3.5–5.2)
Alkaline Phosphatase: 64 U/L (ref 39–117)
Bilirubin, Direct: 0.2 mg/dL (ref 0.0–0.3)
Total Bilirubin: 0.7 mg/dL (ref 0.2–1.2)
Total Protein: 6.4 g/dL (ref 6.0–8.3)

## 2015-07-25 LAB — TSH: TSH: 3.01 u[IU]/mL (ref 0.35–4.50)

## 2015-07-25 LAB — HEMOGLOBIN A1C: Hgb A1c MFr Bld: 6.1 % (ref 4.6–6.5)

## 2015-07-25 LAB — PSA: PSA: 2.14 ng/mL (ref 0.10–4.00)

## 2015-07-28 ENCOUNTER — Encounter: Payer: Self-pay | Admitting: Family

## 2015-07-29 ENCOUNTER — Ambulatory Visit (INDEPENDENT_AMBULATORY_CARE_PROVIDER_SITE_OTHER): Payer: Medicare Other | Admitting: Pulmonary Disease

## 2015-07-29 ENCOUNTER — Encounter: Payer: Self-pay | Admitting: Pulmonary Disease

## 2015-07-29 VITALS — BP 142/64 | HR 71 | Temp 97.5°F | Ht 74.0 in | Wt 226.2 lb

## 2015-07-29 DIAGNOSIS — E663 Overweight: Secondary | ICD-10-CM

## 2015-07-29 DIAGNOSIS — E7800 Pure hypercholesterolemia, unspecified: Secondary | ICD-10-CM

## 2015-07-29 DIAGNOSIS — D126 Benign neoplasm of colon, unspecified: Secondary | ICD-10-CM

## 2015-07-29 DIAGNOSIS — I779 Disorder of arteries and arterioles, unspecified: Secondary | ICD-10-CM

## 2015-07-29 DIAGNOSIS — G458 Other transient cerebral ischemic attacks and related syndromes: Secondary | ICD-10-CM

## 2015-07-29 DIAGNOSIS — R7301 Impaired fasting glucose: Secondary | ICD-10-CM

## 2015-07-29 DIAGNOSIS — I2510 Atherosclerotic heart disease of native coronary artery without angina pectoris: Secondary | ICD-10-CM

## 2015-07-29 DIAGNOSIS — F419 Anxiety disorder, unspecified: Secondary | ICD-10-CM

## 2015-07-29 DIAGNOSIS — I739 Peripheral vascular disease, unspecified: Secondary | ICD-10-CM

## 2015-07-29 DIAGNOSIS — I10 Essential (primary) hypertension: Secondary | ICD-10-CM | POA: Diagnosis not present

## 2015-07-29 NOTE — Patient Instructions (Signed)
Today we updated your med list in our EPIC system...    Continue your current medications the same...  Today we reviewed your recent fasting blood work & gave you a copy...  You need to get on a better low carb low fat diet & get your weight down...    Increase your exercise program...  We will help you arrange a yearly f/u cardiology appt w/ DrNishan...  Call for any questions...  Let's plan a follow up visit in 43mo to monitor your progress.Marland KitchenMarland Kitchen

## 2015-07-29 NOTE — Progress Notes (Signed)
Subjective:    Patient ID: [name] [name], male    DOB: 03/01/1941, 74 y.o.   MRN: [id number]  HPI 74 y/o WM here for a follow up visit... he has multiple medical problems as noted below...   SEE PREV EPIC NOTES FOR EARLIER DATA>>   LABS 6/14:  FLP- on Lip10 w/ TChol/LDL ok but TG/HDL still off;  Chems- ok x BS=136, A1c=6.4;  CBC- wnl;  TSH=2.66;  PSA=1.65;  CRP done at pt request=0.5.Marland KitchenMarland KitchenMarland Kitchen  ~  January 22, 2014:  6-49mo Woodward claims that "1wk ago I changed my lifestyle"- going to the gym everyday, stopped all tobacco (snuff), etc;  He had a URI about 3wks ago- better now after OTC rx;  His CC is ED & he reports no benefit from Sildenafil- we discussed refer to Urology to discuss alternatives... We reviewed the following medical problems during today's office visit >>     [name] on Diovan160 & HCT-12.5 (intol to BBlockers in past); BP= 130/60 & he says good at home too; denies CP, palpit, SOB, edema, etc...    CAD> s/p 4 vessel CABG 2005, on ASA/ Plavix; he saw [name] 8/14 for yearly check up- stable & no changes made...    Cerebrovasc Dis> on ASA/ Plavix; followed by [name] & seen 1/15 w/ f/u CDopplers stable w/ 60-79% right ICA stenosis, they are checking Q31mo...    CHOL> on Lip20- taking 1/2 tab daily; FLP 6/14 showed TChol 126, TG 155, HDL 32, LDL 63; he is intol Niacin & rec to incr exercise program & get wt down...    Overweight> weight= 230# unchanged; we reviewed diet, exercise, & wt loss program...    Hx colon polyps> f/u colonoscopy 2/13 by [name]- neg, no polyps & he rec f/u in 21yrs...    GU> he reports no benefit from Sildenafil & requests Urology appt to discuss additional alternatives...    DJD/ Vit D defic> he uses OTC analgesics as needed; continues on Vit D 2000u daily w/ last VitD level 6/13= 34...    Hx TIA> SEE 6/10 Hosp> sm vessel dis, remote lacunar infarcts, intracranial atherosclerotic changes on MRA; CDopplers followed by [name] as above w/ 60-79%  RICAstenosis; on ASA/Plavix...    Keloid> in lower sternal scar w/ plastic repair in past... We reviewed prob list, meds, xrays and labs> see below for updates >> he had the 2014 Flu vaccine in Dec...   ~  July 28, 2014:  72mo ROV & [name] reports doing well, no new complaints or concerns; he reports that he has quit all tobacco products & last use was ~1wk ago (prev admits to 1 can/d), he credits his little grandson; his wt is down 6#- going to the gym vs walking several days per wk but still NOT on much of a diet... We reviewed the following medical problems during today's office visit >>     [name] on Diovan160 & HCT-12.5 (intol to BBlockers in past); BP= 132/62 & he says good at home too; denies CP, palpit, SOB, edema, etc...    CAD> s/p 4 vessel CABG 2005, on ASA/ Plavix; he saw [name] 8/14 for yearly check up- stable & no changes made...    Cerebrovasc Dis> on ASA/ Plavix; followed by [name] & seen 7/15 w/ f/u CDopplers stable w/ 60-79% right ICA stenosis, they are checking Q50mo...    CHOL> on Lip10- taking 1/2 of 20mg  tab daily; FLP 7/15 showed TChol 118, TG 166, HDL 29, LDL 56; he is  intol Niacin & rec to incr exercise program & get wt down...    Overweight> weight= 224# which is down 6#; we reviewed diet, exercise, & wt loss program...    Hx colon polyps> f/u colonoscopy 2/13 by [name]- neg, no polyps & he rec f/u in 45yrs...    GU> he reports no benefit from Sildenafil & prev requested Urology appt to discuss additional alternatives but he never went...    DJD/ Vit D defic> he uses OTC analgesics as needed; continues on Vit D 2000u daily w/ last VitD level 6/13= 34...    Hx TIA> SEE 6/10 Hosp> sm vessel dis, remote lacunar infarcts, intracranial atherosclerotic changes on MRA; CDopplers followed by [name] as above w/ 60-79% RICAstenosis; on ASA/Plavix...    Keloid> in lower sternal scar w/ plastic repair in past... We reviewed prob list, meds, xrays and labs> see below for updates >>    CXR 7/15 showed cardiomeg, no change in contour of mediastinum compared to mult old films, clear lungs, NAD...   CDopplers 7/15 showed 60-79% RICAstenosis & <64% LICAstenosis (no change)...   LABS 7/15:  FLP- TChol&LDL ok on Lip10 but TG incr & HDL too low;  Chems- ok w/ BS=115A1c=6.2;  CBC- wnl;  TSH=2.51;  PSA=1.78...   ~  January 28, 2015:  72mo ROV & Keefer reports a good interval- no new medical complaints or concerns; he has however been under a lot of stress regarding his 21y/o grand daughter (Hx obesity >300#, subseq gastric bypass in [place], now Dx w/ anorexia, & she was the victim of a sexual assault); he has also had 4 friends pass away over the last month, mult funerals etc;  We discussed these issues 7 offered anxiolytic but he declines 7 he seems o be handling it all as well as can be expected, he will call if he feels he needs Rx...     [name] on Diovan160 & HCT 12.5; Norvasc caused swelling; BP= 130/66 & feeling well w/o CP, palpit, dizzy, SOB, edema...    He saw [name] for Cards 8/15> known CAD & prev CABG 2005, normal LVF, on ASA/Plavix; stable on meds and no changes made...     He saw [facility] for f/u Carotid stenosis 1/16> he remains on ASA/Plavix & asymptomatic w/o signs of cerebral ischemia; f/u CDoppler 1/16 (no bruits) showed stable 60-79% right ICAstenosis & left <40% ICAstenosis (f/u Q15mo).    Chol> well controlled on Lip20; he is intol to Niacin... We reviewed prob list, meds, xrays and labs> see below for updates >> Given 2015 flu vaccine today...  ~  July 29, 2015:  72mo ROV & Kobie reports doing satis "I'm handling stress better these days"- continued issues w/ 22 y/o grand-daughter... We reviewed the following medical problems during today's office visit >>     [name] on Diovan160 & HCT-12.5 (intol to BBlockers in past); BP= 142/64 & he says good at home too; denies CP, palpit, SOB, edema, etc...    CAD> s/p 4 vessel CABG 2005, on ASA/ Plavix; he saw DrNishan 8/15 for  yearly check up- stable & no changes made...    Cerebrovasc Dis> on ASA/ Plavix; followed by [name] & seen 1/16 w/ f/u CDopplers stable w/ 60-79% right ICA stenosis, <40% left ICA stenosis, they are checking Q75mo...    CHOL> on Lip10- taking 1/2 of 20mg  tab daily; FLP 7/16 showed TChol 129, TG 182, HDL 31, LDL 62; he is intol Niacin & rec to incr exercise program & get  wt down...    Overweight> weight= 226# which is up 2#; we reviewed diet, exercise, & wt loss program...    Hx colon polyps> f/u colonoscopy 2/13 by [name]- neg, no polyps & he rec f/u in 27yrs...    GU> he reports no benefit from Sildenafil & prev requested Urology appt to discuss additional alternatives but he never went...    DJD/ Vit D defic> he uses OTC analgesics as needed; continues on Vit D 2000u daily w/ last VitD level 6/13= 34...    Hx TIA> SEE 6/10 Hosp> sm vessel dis, remote lacunar infarcts, intracranial atherosclerotic changes on MRA; CDopplers followed by [name] as above w/ 60-79% RICAstenosis; on ASA/Plavix...    Keloid> in lower sternal scar w/ plastic repair in past; he saw Derm 2016 for removal of SK, epidermoid cyst, lipoma EXAM reveals Afeb, VSS, O2sat=97% on RA;  HEENT- neg x wax;  Vasc- bilat C bruits R>L;  Chest- clear w/o w/r/r;  Heart- RR gr1/6SEM w/o r/g;  Abd- obese, soft, neg;  Ext- +VV/VI, w/o c/c/e... We reviewed prob list, meds, xrays and labs> see below for updates >>   LABS 7/16:  FLP- at goals on Cres5;  Chems- wnl x BS=111, A1c=6.1;  CBC- wnl;  TSH=3.01;  PSA=2.14...           Problem List:    ENT >>  He sawDrWolicki [id number] for cerumen, hearing loss, presbycusis- candidate for hearing aides but he is holding off...  HYPERTENSION (ICD-401.9) >> ~  6/09:  prev on Atenolol25mg - stopped due to side effects & feels much better off BBlockers. ~  2/10:  Avapro300 changed to Diovan160 per his insurance company for $$$ reasons. ~  11/11:  BP= 150/80 & remains asymptomatic, doesn't want to incr  meds- discussed diet/ exerc... ~  5/12:  BP= 132/70 & denies HA, fatigue, visual changes, CP, palipit, dizziness, syncope, dyspnea, edema, etc... ~  1/13:  BP= 152/68 & he remains asymptomatic; we decided to add NORVASC 5mg /d ==> pt stopped on his own due to swelling. ~  6/13:  He saw DrNishan for f/u & BP= 154/80 on Diovan alone; added HCTZ 12.5mg /d... ~  7/13:  BP= 148/82 here & 130s/70s at home he says on DIOVAN160mg /d & HCTZ 12.5mg /d; continue same ~  CXR 7/13 showed cardiomeg, s/p CABG, bronchitic changes at lung bases, NAD... ~  1/14: on Diovan160 & HCT-12.5 (intol to BBlockers in past); BP= 142/80 & he says better at home; denies CP, palpit, SOB, edema, etc...  ~  7/14: on Diovan160 & HCT-12.5 (intol to BBlockers in past); BP= 140/70 & he remains asymptomatic... ~  1/15: on Diovan160 & HCT-12.5; BP= 130/60 & he says good at home too; denies CP, palpit, SOB, edema, etc ~  CXR 7/15 showed cardiomeg, no change in contour of mediastinum compared to mult old films, clear lungs, NAD ~  1/16:  on Diovan160 & HCT 12.5; Norvasc caused swelling; BP= 130/66 & feeling well w/o CP, palpit, dizzy, SOB, edema. ~  7/16: on Diovan160 & HCT-12.5 (intol to BBlockers in past); BP= 142/64 & he says even better at home; he remains asymptomatic.  CORONARY ARTERY DISEASE (ICD-414.00) - S/P 4 vessel CABG 2/05 by DrVanTrigt> on ASA/ PLAVIX & followed by [name] yearly & his notes are reviewed; he is intol to BBlocker therapy as noted... ~  1/13 & 7/13:  stable & denies angina, palpit, SOB, edema, etc; EKG showed NSR, rate71, WNL, NAD.[name].  ~  8/14: he had yearly f/u DrNishan>  CAD, s/pCABG in 2005, norm LVF; EKG showed NSR, rate77, NSSTTWA, NAD; stable, rec to incr exercise & lose wt; no change in meds...  ~  8/15: He saw [name] for yearly Cards visit> known CAD & prev CABG 2005, normal LVF, on ASA/Plavix; stable on meds and no changes made  CEREBROVASCULAR DISEASE (ICD-437.9) - see above> on ASA 81mg /d, & PLAVIX  75mg /d... followed by Sharlot Gowda for [facility]. ~  6/10:  See 6/10 Hosp records & f/u by [name] in his office... ~  6/11:  seen by [name] & stable w/o TIAs or amaurosis; CDopplers were stable w/ bilat 40-59% ICA stenoses, f/u 85yr. ~  He was due for a follow up eval 6/12> we don't have records & will call for the report... ~  6/13:  He saw [name] for f/u asymptomatic carotid dis> CDoppler 6/13 showed some progression of right sided stenosis (now 60-79%) but denies cerebral ischemic symptoms (right side remains 40-59%); continue ASA/Plavix & f/u planned 17mo. ~  1/14:  f/u CDopplers were stable w/ 60-79% right ICA stenosis & 20-39% left ICA stenosis & f/u planned in 59mo... ~  7/14:  f/u CDopplers at [facility] showed 60-79% right ICAstenosis and 0-40% left ICAstenosis- f/u Q23mo... ~  1/15:  f/u w/ [facility] for known Carotid stenosis> CDuplex showed 60-79% right carotid stenosis and 40-59% left carotid stenosis; continue ASA/Plavix and lifestyle mod strategies... ~  7/15:  f/u w/ [facility] for known Carotid stenosis> CDuplex unchanges w/ 60-79% RICAstenosis... ~  1/16: He saw [facility] for f/u Carotid stenosis> he remains on ASA/Plavix & asymptomatic w/o signs of cerebral ischemia; f/u CDoppler 1/16 (no bruits) showed stable 60-79% right ICAstenosis & left <40% ICAstenosis (f/u Q24mo).  VENOUS INSUFFICIENCY (ICD-459.81) - he is aware of need to elim salt, elevate legs, wear support hose.  HYPERCHOLESTEROLEMIA (ICD-272.0) - controlled on LIPITOR 20- 1/2 tab daily... he has tried NIACIN in the past and refuses to take it again due to headaches. ~  FLP 5/08 showed TChol 125, TG 171, HDL 24, LDL 67...  ~  [place] 12/08 showed TChol 107, TG 131, HDL 23, LDL 58... rec- same med, better diet & exercise. ~  FLP 2/10 on Lip10 (wt=235#) showed TChol 119, TG 158, HDL 24, LDL 63... rec- ditto ~  [place] 11/10 on Lip10 (wt=236#) showed TChol 115, TG 128, HDL 28, LDL 62 ~  FLP 5/11 on Lip10 (wt=233#) showed TChol 119, TG 144, HDL 27, LDL 63 ~  DrNishan  suggested that he cut the Lipitor to 5mg /d & add Niaspan, but he refuses the Niacin preps due to HAs & wants to contin Lip10. ~  FLP 11/11 on Lip10 (wt=235#) showed TChol 119, TG 113, HDL 31, LDL 66 ~  FLP 5/12 on Lip10 (wt=229#) showed TChol 118, TG 150, HDL 28, LDL 60 ~  FLP 6/13 on Lip10 (wt=232#) showed TChol 127, TG 164, HDL 27, LDL 67... He tells me [name] wanted him in an HDL trial... ~  6/14: on Lip20- taking 1/2 tab daily; FLP 6/14 shows TChol 126, TG 155, HDL 32, LDL 63; he is intol Niacin & rec to incr exercise program & get wt down ~  FLP 7/15 on Lip10 showed TChol 118, TG 166, HDL 29, LDL 56... ~  FLP 7/16 on Lip10 showed TChol 129, TG 182, HDL 31, LDL 62  DIABETES MELLITUS, BORDERLINE (ICD-790.29) - On diet Rx alone... ~  labs in hosp 6/10 showed BS= 109-113 ~  labs 11/10 showed BS= 109, A1c= 5.9 ~  labs 5/11 showed BS= 107 ~  labs 11/11 showed BS= 108 ~  Labs 5/12 showed BS= 97, A1c= 6.0 ~  Labs 6/13 showed BS=122, A1c= 6.4... Needs better diet & wt reduction. ~  Labs 6/14 showed BS= 136, A1c= 6.4 ~  Labs 7/15 on diet alone showed BS=115, A1c= 6.2 ~  Labs 7/16 on diet alone showed BS= 111, A1c= 6.1  OVERWEIGHT (ICD-278.02) - he is 6'2" tall and weighs ~230# for a BMI of 30... we have discussed diet & exercise stategies. ~  7/15: he has lost 6# down to 224# today... ~  7/16: he has gained 2# to 226#  COLONIC POLYPS (ICD-211.3) - last colonoscopy 10/07 by [name] showed several 5-6 mm polyps... path= tubular adenomas ... f/u planned 5 years. ~  Colonoscopy 2/13 by [name] was neg- no recurrent polyps etc & he felt that 29yr f/u was appropriate...  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 Rx w/ PT.  VITAMIN D DEFIC >> on Vit D supplement 2000u  daily... ~  Labs 2/10 showed Vit D level = 14... rec to start Vit D supplement OTC... ~  Labs 6/13 showed Vit D level = 34... rec to continue his VitD supplement daily...  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 ~  6/10:  Hosp w/ TIA w/ left face & arm symptoms that resolved quickly; CT showed small vessel disease;  MRI showed bilat remote lacunar infarcts, no acute infarct, +sm vessel dis;  MRA showed intracranial atherosclerotic changes, & ?focal stenosis (?70%) of left ICA in the neck; he saw [name] 06/08/09 (no bruits) w/ CDopplers done showing bilat 40-59% carotid stenoses & agreed w/ ASA + Plavix and f/u 47yr...  ~  6/13: saw [name] for f/u asymptomatic carotid dis> CDoppler 6/13 showed some progression of right sided stenosis (now 60-79%) but denies cerebral ischemic symptoms (right side remains 40-59%); continue ASA/Plavix & f/u planned 45mo. ~  1/14: he continues Q57mo f/u w/ [facility]- stable w/o cerebral ischemic symptoms => CDopplers are stable (see above).  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 he had plastic surg by [facility] 5[facility]09 w/ improved scar. ~  He has seb cyst over CSpine & he tells me that DrTafeen is going to remove this soon... ~  He has a small knot/lesion ?cyst over the fibular head on lat side of left lower leg; stable, no discomfort, no change & offered Ortho eval but he prefers to just watch it for now... ~  2016> he saw Derm for removal of  SK, epidermoid cyst, lipoma...  HEALTH MAINTENANCE: ~  GI:  followed by [name] & last colonoscopy was 10/07 w/ f/u planned 68yrs. ~  GU:  he is up to date on DRE & PSA checks here (PSA remains in the 1-2 range). ~  Immunizations:  he had [place] in 2000 w/ repeat PNEUMOVAX 11/10 & TDAP 5/11... he gets the yearly Seasonal Flu vaccine each fall...   Past Surgical History  Procedure Laterality Date  . Pilonidial cystectomy  1989  . Keloid surgery on  chest scar  04/2008    Dr. Dessie Coma  . Coronary artery bypass graft  Feb. 2005    4 vessel    Outpatient Encounter Prescriptions as of 07/29/2015  Medication Sig  . aspirin 81 MG tablet Take 81 mg by mouth daily.    [name] atorvastatin (LIPITOR) 20 MG tablet Take 0.5 tablets (10 mg total) by mouth daily. Take as directed  . Cholecalciferol (VITAMIN D) 2000 UNITS CAPS Take 1 capsule by mouth daily.   . clopidogrel (PLAVIX) 75 MG tablet Take 1 tablet (75 mg total) by mouth daily.  . hydrochlorothiazide (MICROZIDE) 12.5 MG capsule TAKE 1 CAPSULE (12.5 MG TOTAL) BY MOUTH DAILY.  . nitroGLYCERIN (NITROSTAT) 0.4 MG SL tablet Place 1 tablet (0.4 mg total) under the tongue every 5 (five) minutes as needed for chest pain.  . valsartan (DIOVAN) 160 MG tablet TAKE 1 TABLET EVERY DAY   No facility-administered encounter medications on file as of 07/29/2015.    Allergies  Allergen Reactions  . Niacin Other (See Comments)    REACTION: intol to NIACIN w/ headaches    Current Medications, Allergies, Past Medical History, Past Surgical History, Family History, and Social History were reviewed in [employer] record.    Review of Systems         See HPI - all other systems neg except as noted... The patient denies anorexia, fever, weight loss, weight gain, vision loss, decreased hearing, hoarseness, chest pain, syncope, dyspnea on exertion, peripheral edema, prolonged cough, headaches, hemoptysis, abdominal pain, melena, hematochezia, severe indigestion/heartburn, hematuria, incontinence, muscle weakness, suspicious skin lesions, transient blindness, difficulty walking, depression, unusual weight change, abnormal bleeding, enlarged lymph nodes, and angioedema.     Objective:   Physical Exam    WD, Overweight, 74 y/o WM in NAD... Vital Signs:  Reviewed> GENERAL:  Alert & oriented; pleasant & cooperative. HEENT:  [place]/AT, EOM-wnl, PERRLA, Fundi-benign, EACs-clear, TMs-wnl,  NOSE-clear, THROAT-clear & wnl. NECK:  Supple w/ fairROM; no JVD; normal carotid impulses w/o bruits; no thyromegaly or nodules palpated; no lymphadenopathy. CHEST:  Clear to P & A; without wheezes/ rales/ or rhonchi. HEART:  Median sternotomy scar w/ improved keloid, regular rhythm; without murmurs/ rubs/ or gallops. ABDOMEN:  Soft & nontender; normal bowel sounds; no organomegaly or masses detected. 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 EXT: without deformities, mild arthritic changes; + venous insuffic changes, no edema. NEURO:  CN's intact; motor testing normal; sensory testing normal; gait normal & balance OK. DERM:  Keloid as noted, no other lesions...  RADIOLOGY DATA:  Reviewed in the EPIC EMR & discussed w/ the patient...  LABORATORY DATA:  Reviewed in the EPIC EMR & discussed w/ the patient...   Assessment & Plan:    [name]  On Diovan160 & HCT 12.5 w/ improved/stable BP; needs to lose wt & elim sodium...  CAD, s/p CABG 2005> followed by [name] for Cards, stable & doing satis...  CEREBROVASC DIS/ TIA>  No further cerebral ischemic symptoms on the ASA/ Plavix; f/u CDoppler w/ 60-79% RICAstenosis & [name] plans 61mo ROV.  CHOL>  On Lip10 & stable; needs better low fat diet & get wt down...  Borderline DM/ IFG> adeq control w/ diet alone but he understands the importance of wt reduction.  GI>  He had colonoscopy f/u 2/13 & it was neg...  Other medical problems as noted...   Patient's Medications  New Prescriptions   No medications on file  Previous Medications   ASPIRIN 81 MG TABLET    Take 81 mg by mouth daily.     ATORVASTATIN (LIPITOR) 20 MG TABLET    Take 0.5 tablets (10 mg total) by mouth daily. Take as directed  CHOLECALCIFEROL (VITAMIN D) 2000 UNITS CAPS    Take 1 capsule by mouth daily.    CLOPIDOGREL (PLAVIX) 75 MG TABLET    Take 1 tablet (75 mg total) by mouth daily.   HYDROCHLOROTHIAZIDE (MICROZIDE) 12.5 MG CAPSULE    TAKE 1 CAPSULE (12.5 MG TOTAL)  BY MOUTH DAILY.   NITROGLYCERIN (NITROSTAT) 0.4 MG SL TABLET    Place 1 tablet (0.4 mg total) under the tongue every 5 (five) minutes as needed for chest pain.   VALSARTAN (DIOVAN) 160 MG TABLET    TAKE 1 TABLET EVERY DAY  Modified Medications   No medications on file  Discontinued Medications   No medications on file

## 2015-08-02 ENCOUNTER — Ambulatory Visit (HOSPITAL_COMMUNITY)
Admission: RE | Admit: 2015-08-02 | Discharge: 2015-08-02 | Disposition: A | Discharge status: Home / Self Care | Payer: Medicare Other | Source: Ambulatory Visit | Attending: Family | Admitting: Family

## 2015-08-02 ENCOUNTER — Encounter: Payer: Self-pay | Admitting: Family

## 2015-08-02 ENCOUNTER — Ambulatory Visit (INDEPENDENT_AMBULATORY_CARE_PROVIDER_SITE_OTHER): Payer: Medicare Other | Admitting: Family

## 2015-08-02 VITALS — BP 126/69 | HR 67 | Temp 97.4°F | Resp 16 | Ht 74.0 in | Wt 226.0 lb

## 2015-08-02 DIAGNOSIS — I6523 Occlusion and stenosis of bilateral carotid arteries: Secondary | ICD-10-CM | POA: Insufficient documentation

## 2015-08-02 DIAGNOSIS — Z720 Tobacco use: Secondary | ICD-10-CM | POA: Diagnosis not present

## 2015-08-02 DIAGNOSIS — Z87891 Personal history of nicotine dependence: Secondary | ICD-10-CM

## 2015-08-02 NOTE — Patient Instructions (Signed)
Stroke Prevention Some medical conditions and behaviors are associated with an increased chance of having a stroke. You may prevent a stroke by making healthy choices and managing medical conditions. HOW CAN I REDUCE MY RISK OF HAVING A STROKE?   Stay physically active. Get at least 30 minutes of activity on most or all days.  Do not smoke. It may also be helpful to avoid exposure to secondhand smoke.  Limit alcohol use. Moderate alcohol use is considered to be:  No more than 2 drinks per day for men.  No more than 1 drink per day for nonpregnant women.  Eat healthy foods. This involves:  Eating 5 or more servings of fruits and vegetables a day.  Making dietary changes that address high blood pressure (hypertension), high cholesterol, diabetes, or obesity.  Manage your cholesterol levels.  Making food choices that are high in fiber and low in saturated fat, trans fat, and cholesterol may control cholesterol levels.  Take any prescribed medicines to control cholesterol as directed by your health care provider.  Manage your diabetes.  Controlling your carbohydrate and sugar intake is recommended to manage diabetes.  Take any prescribed medicines to control diabetes as directed by your health care provider.  Control your hypertension.  Making food choices that are low in salt (sodium), saturated fat, trans fat, and cholesterol is recommended to manage hypertension.  Take any prescribed medicines to control hypertension as directed by your health care provider.  Maintain a healthy weight.  Reducing calorie intake and making food choices that are low in sodium, saturated fat, trans fat, and cholesterol are recommended to manage weight.  Stop drug abuse.  Avoid taking birth control pills.  Talk to your health care provider about the risks of taking birth control pills if you are over 35 years old, smoke, get migraines, or have ever had a blood clot.  Get evaluated for sleep  disorders (sleep apnea).  Talk to your health care provider about getting a sleep evaluation if you snore a lot or have excessive sleepiness.  Take medicines only as directed by your health care provider.  For some people, aspirin or blood thinners (anticoagulants) are helpful in reducing the risk of forming abnormal blood clots that can lead to stroke. If you have the irregular heart rhythm of atrial fibrillation, you should be on a blood thinner unless there is a good reason you cannot take them.  Understand all your medicine instructions.  Make sure that other conditions (such as anemia or atherosclerosis) are addressed. SEEK IMMEDIATE MEDICAL CARE IF:   You have sudden weakness or numbness of the face, arm, or leg, especially on one side of the body.  Your face or eyelid droops to one side.  You have sudden confusion.  You have trouble speaking (aphasia) or understanding.  You have sudden trouble seeing in one or both eyes.  You have sudden trouble walking.  You have dizziness.  You have a loss of balance or coordination.  You have a sudden, severe headache with no known cause.  You have new chest pain or an irregular heartbeat. Any of these symptoms may represent a serious problem that is an emergency. Do not wait to see if the symptoms will go away. Get medical help at once. Call your local emergency services (911 in [place] Do not drive yourself to the hospital. Document Released: 01/24/2005 Document Revised: 05/03/2014 Document Reviewed: 06/19/2013 [employer] Patient Information 2015 [employer]. This information is not intended to replace advice given   to you by your health care provider. Make sure you discuss any questions you have with your health care provider.  

## 2015-08-02 NOTE — Progress Notes (Signed)
Established Carotid Patient   History of Present Illness  [name] [name] is a 74 y.o. male patient of Dr. Donnetta Hutching who has known carotid artery stenosis returns today for carotid arteries surveillance.  He has not had previous carotid artery intervention.  In June of 2011 or 2012 he experienced mild left facial drooping and numbness that lasted 3-4 minutes, was evaluated at [facility] ED, states CT of his head did not show a stroke; states he has had not had any further TIA or stroke symptoms.  He states that his blood pressure usually runs 125-135/65-70.  He admits to not getting enough exercise.   He denies claudication symptoms.  The patient denies amaurosis fugax or monocular blindness. Pt. denies hemiplegia. The patient denies receptive or expressive aphasia. Pt. denies extremity weakness.  Patient denies New Medical or Surgical History.  He reports his granddaughter is going through some medical issues which is a source of stress for him.  He had a 4 vessel CABG in 2005. He sees Dr. Johnsie Cancel yearly and he is due to see him this month, states he needs to make an appointment. I advised him to make an appointment for ASAP before his right CEA.   Pt Diabetic: No, Review of records: 6.2 A1C in July 2015, but states he does not have DM  Pt smoker: non-smoker, he occasionally uses smokeless tobacco   Pt meds include:  Statin : Yes  ASA: Yes  Other anticoagulants/antiplatelets: Plavix   Past Medical History  Diagnosis Date  . Hypertension   . Coronary artery disease   . Cerebrovascular disease   . Renal insufficiency   . Hypercholesterolemia   . Diabetes mellitus   . Overweight(278.02)   . Personal history of colonic polyps 10/08/2006    tubular adenomas  . Shoulder pain   . Vitamin D deficiency   . Gynecomastia   . Transient ischemic attack   . Keloid   . Neurodermatitis   . Carotid artery occlusion     Social History History  Substance Use Topics  . Smoking  status: Never Smoker   . Smokeless tobacco: Former Systems developer    Types: Chew    Quit date: 07/11/2014     Comment: uses dip 3-4 times per week  . Alcohol Use: No    Family History Family History  Problem Relation Age of Onset  . Colon cancer Neg Hx   . Lung cancer Paternal Uncle     questionable as to if it was lung ca  . Heart disease Mother     Before age 48  . Diabetes Mother   . Kidney disease Mother   . Heart attack Mother   . Cancer Father     Lung  . Diabetes Brother   . Heart disease Brother   . Diabetes Sister     Surgical History Past Surgical History  Procedure Laterality Date  . Pilonidial cystectomy  1989  . Keloid surgery on chest scar  04/2008    Dr. Dessie Coma  . 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 Niacin Other (See Comments)    REACTION: intol to NIACIN w/ headaches    Current Outpatient Prescriptions  Medication Sig Dispense Refill  . aspirin 81 MG tablet Take 81 mg by mouth daily.      [name] atorvastatin (LIPITOR) 20 MG tablet Take 0.5 tablets (10 mg total) by mouth daily. Take as directed 30 tablet 11  .  Cholecalciferol (VITAMIN D) 2000 UNITS CAPS Take 1 capsule by mouth daily.     . clopidogrel (PLAVIX) 75 MG tablet Take 1 tablet (75 mg total) by mouth daily. 90 tablet 0  . hydrochlorothiazide (MICROZIDE) 12.5 MG capsule TAKE 1 CAPSULE (12.5 MG TOTAL) BY MOUTH DAILY. 30 capsule 1  . nitroGLYCERIN (NITROSTAT) 0.4 MG SL tablet Place 1 tablet (0.4 mg total) under the tongue every 5 (five) minutes as needed for chest pain. 25 tablet 3  . valsartan (DIOVAN) 160 MG tablet TAKE 1 TABLET EVERY DAY 90 tablet 1   No current facility-administered medications for this visit.    Review of Systems : See HPI for pertinent positives and negatives.  Physical Examination  Filed Vitals:   08/02/15 0924 08/02/15 0930 08/02/15 0931  BP: 145/75 139/73 126/69  Pulse: 67 67 67  Temp: 97.4 F (36.3 C)    Resp: 16     Height: 6\' 2"  (1.88 m)    Weight: 226 lb (102.513 kg)    SpO2: 96%     Body mass index is 29 kg/(m^2).   General: WDWN male in NAD  GAIT: normal  Eyes: PERRLA  Pulmonary: CTAB, Negative Rales, Negative rhonchi, & Negative wheezing. Occasional dry cough.  Cardiac: regular Rhythm, no detected murmur.  VASCULAR EXAM  Carotid Bruits  Left  Right    Negative  Negative   Aorta is not palpable Radial pulses are 2+ palpable and equal.   LE Pulses  LEFT  RIGHT   POPLITEAL  Not palpable  Not palpable   POSTERIOR TIBIAL  palpable  palpable   DORSALIS PEDIS  ANTERIOR TIBIAL  not palpable  not palpable    Gastrointestinal: soft, nontender, BS WNL, no r/g, no palpable masses.  Musculoskeletal: Negative muscle atrophy/wasting. M/S 5/5 throughout, Extremities without ischemic changes.  Neurologic: A&O X 3; Appropriate Affect, Speech is normal  CN 2-12 intact, Pain and light touch intact in extremities, Motor exam as listed above.        Non-Invasive Vascular Imaging CAROTID DUPLEX 08/02/2015   CEREBROVASCULAR DUPLEX EVALUATION    INDICATION: Follow-up carotid disease     PREVIOUS INTERVENTION(S):     DUPLEX EXAM:     RIGHT  LEFT  Peak Systolic Velocities (cm/s) End Diastolic Velocities (cm/s) Plaque LOCATION Peak Systolic Velocities (cm/s) End Diastolic Velocities (cm/s) Plaque  96 12  CCA PROXIMAL 118 19   74 16  CCA MID 98 19   97 22 HT CCA DISTAL 105 21 HT  138 15 HT ECA 112 13 HT  77 17 HT ICA PROXIMAL 141 32 HT  447 139 CP ICA MID 184 42 HT  94 30  ICA DISTAL 83 23     4.6 ICA / CCA Ratio (PSV) 1.8  Antegrade  Vertebral Flow Antegrade    Brachial Systolic Pressure (mmHg)   Within normal limits  Brachial Artery Waveforms Within normal limits     Plaque Morphology:  HM = Homogeneous, HT = Heterogeneous, CP = Calcific Plaque, SP = Smooth Plaque, IP = Irregular Plaque  ADDITIONAL FINDINGS:     IMPRESSION: 1. Evidence of  80%-99% stenosis of the right mid internal carotid artery. Vessel is normal beyond the calcific lesion and there is a low bifurcation. 2. Evidence of 40%-59% stenosis of the left mid internal carotid artery. 3. Bilateral vertebral artery is antegrade.    Compared to the previous exam:  Disease progression compared to previous exam.     Assessment: [name] [name] is  a 74 y.o. male who in June of 2011 or 2012 experienced mild left facial drooping and numbness that lasted 3-4 minutes, was evaluated at [facility] ED, states CT of his head did not show a stroke; states he has had not had any further TIA or stroke symptoms. We have been monitoring his carotid artery stenosis. Today's carotid Duplex suggests 80%-99% stenosis of the right mid internal carotid artery. Vessel is normal beyond the calcific lesion and there is a low bifurcation. Evidence of 40%-59% stenosis of the left mid internal carotid artery. Disease progression compared to previous exam on 01/25/15.  Pt is due to see Dr. Johnsie Cancel this month for his yearly exam; I advised him to make an appointment ASAP for cardiac risk stratification prior to contemplated right CEA.  His atherosclerotic risk factors include pre-diabetes, occasional use of smokeless tobacco, CAD with history of 4 vessel CABG, dyslipidemia, and hypertension.    Plan: Follow-up with Dr. Donnetta Hutching on 08/09/15 which is Dr. Luther Parody next available opening on his schedule. This will be to discuss right CEA.  See Dr. Johnsie Cancel ASAP for cardiac risk stratification.   I discussed in depth with the patient the nature of atherosclerosis, and emphasized the importance of maximal medical management including strict control of blood pressure, blood glucose, and lipid levels, obtaining regular exercise, and cessation of tobacco use.  The patient is aware that without maximal medical management the underlying atherosclerotic disease process will progress, limiting the benefit of any  interventions. The patient was given information about stroke prevention and what symptoms should prompt the patient to seek immediate medical care. Thank you for allowing [place] to participate in this patient's care.  Clemon Chambers, RN, MSN, FNP-C [facility] [facility] Office: [phone]  Clinic Physician: Early  08/02/2015 9:14 AM

## 2015-08-08 ENCOUNTER — Encounter: Payer: Self-pay | Admitting: Vascular Surgery

## 2015-08-09 ENCOUNTER — Encounter: Payer: Self-pay | Admitting: Vascular Surgery

## 2015-08-09 ENCOUNTER — Telehealth: Payer: Self-pay | Admitting: Cardiovascular Disease

## 2015-08-09 ENCOUNTER — Ambulatory Visit (INDEPENDENT_AMBULATORY_CARE_PROVIDER_SITE_OTHER): Payer: Medicare Other | Admitting: Vascular Surgery

## 2015-08-09 VITALS — BP 115/71 | HR 76 | Ht 74.0 in | Wt 226.0 lb

## 2015-08-09 DIAGNOSIS — I6523 Occlusion and stenosis of bilateral carotid arteries: Secondary | ICD-10-CM

## 2015-08-09 NOTE — Progress Notes (Signed)
Patient name: [name] [name] MRN: [id number] DOB: 1941/06/09 Sex: male     Reason for referral:  Chief Complaint  Patient presents with  . Re-evaluation    to discuss R CEA    HISTORY OF PRESENT ILLNESS:  the patient is seen today for continued discussion regarding his severe asymptomatic right internal carotid artery stenosis. He's been followed in our office for a number of years with known bilateral moderate stenosis right greater than left. On recent six-month follow-up he was found to have progression to critical right carotid stenosis. He remains asymptomatic. He did have a TIA in 2010 which was felt not to be related to carotid disease. He specifically denies any new episodes of amaurosis fugax, transient ischemic attack or stroke. He remained stable from a cardiac standpoint. He is status post coronary bypass grafting in 2005.  Past Medical History  Diagnosis Date  . Hypertension   . Coronary artery disease   . Cerebrovascular disease   . Renal insufficiency   . Hypercholesterolemia   . Diabetes mellitus   . Overweight(278.02)   . Personal history of colonic polyps 10/08/2006    tubular adenomas  . Shoulder pain   . Vitamin D deficiency   . Gynecomastia   . Transient ischemic attack   . Keloid   . Neurodermatitis   . Carotid artery occlusion     Past Surgical History  Procedure Laterality Date  . Pilonidial cystectomy  1989  . Keloid surgery on chest scar  04/2008    Dr. Dessie Coma  . Coronary artery bypass graft  Feb. 2005    4 vessel    History   Social History  . Marital Status: Married    Spouse Name: [name]  . Number of Children: 1  . Years of Education: N/A   Occupational History  . retired    Social History Main Topics  . Smoking status: Never Smoker   . Smokeless tobacco: Former Systems developer    Types: Chew    Quit date: 07/11/2014     Comment: uses dip 3-4 times per week  . Alcohol Use: No  . Drug Use: No  . Sexual Activity: Not on file    Other Topics Concern  . Not on file   Social History Narrative    Family History  Problem Relation Age of Onset  . Colon cancer Neg Hx   . Lung cancer Paternal Uncle     questionable as to if it was lung ca  . Heart disease Mother     Before age 17  . Diabetes Mother   . Kidney disease Mother   . Heart attack Mother   . Cancer Father     Lung  . Diabetes Brother   . Heart disease Brother   . Diabetes Sister     Allergies as of 08/09/2015 - Review Complete 08/09/2015  Allergen Reaction Noted  . Niacin Other (See Comments)     Current Outpatient Prescriptions on File Prior to Visit  Medication Sig Dispense Refill  . aspirin 81 MG tablet Take 81 mg by mouth daily.      [name] atorvastatin (LIPITOR) 20 MG tablet Take 0.5 tablets (10 mg total) by mouth daily. Take as directed 30 tablet 11  . Cholecalciferol (VITAMIN D) 2000 UNITS CAPS Take 1 capsule by mouth daily.     . clopidogrel (PLAVIX) 75 MG tablet Take 1 tablet (75 mg total) by mouth daily. 90 tablet 0  . hydrochlorothiazide (MICROZIDE) 12.5  MG capsule TAKE 1 CAPSULE (12.5 MG TOTAL) BY MOUTH DAILY. 30 capsule 1  . nitroGLYCERIN (NITROSTAT) 0.4 MG SL tablet Place 1 tablet (0.4 mg total) under the tongue every 5 (five) minutes as needed for chest pain. 25 tablet 3  . valsartan (DIOVAN) 160 MG tablet TAKE 1 TABLET EVERY DAY 90 tablet 1   No current facility-administered medications on file prior to visit.         PHYSICAL EXAMINATION:  General: The patient is a well-nourished male, in no acute distress. Vital signs are BP 115/71 mmHg  Pulse 76  Ht 6\' 2"  (1.88 m)  Wt 226 lb (102.513 kg)  BMI 29.00 kg/m2  SpO2 96% Pulmonary: There is a good air exchange bilaterally without wheezing or rales. Musculoskeletal: There are no major deformities.  There is no significant extremity pain. Neurologic: No focal weakness or paresthesias are detected, Skin: There are no ulcer or rashes noted. Psychiatric: The patient has  normal affect. Cardiovascular:  Palpable l right ulnar pulse with radial harvest incision. 2+  Left radial pulse   [facility] Vascular Lab Studies:   duplex from 08/02/2015 was reviewed with the patient and his wife. This shows a critical stenosis in his right internal carotid artery. The artery is normal above the bifurcation there is a relatively low bifurcation. Moderate stenosis in the left internal carotid is unchanged from prior study  Impression and Plan:   critical stenosis right internal carotid artery. I very long discussion with the patient and his wife present. Explained the approximate 5% per year risk for neurologic deficit related to his asymptomatic stenosis. Explain 1 to 1.5% risk of stroke with surgery. Have recommended endarterectomy for reduction of stroke risk. He understands this is an expected one night hospitalization. He is scheduled to see Dr. Johnsie Cancel for preoperative cardiac clearance. We will proceed with right endarterectomy following this evaluation    Selestino Nila [facility] [place] Office: [phone]

## 2015-08-09 NOTE — Telephone Encounter (Signed)
[name] [name] from [facility] is aware that Dr. Johnsie Cancel is on vacation this week and he does not have an opening in his scheduled prior 8/31. [name] [name] agreed for pt to be seen by a NP/PA prior 8/31. An appointment was made with the [facility] on 8/19 with Drema Balzarine PA at 10:30 AM. Left [name] [name] a detail message and to call back.

## 2015-08-09 NOTE — Telephone Encounter (Signed)
New Message       Office calling stating that Dr. Sherren Mocha Early states that pt needs to be seen prior to 08/31/15 and only needs to be seen by Dr. Johnsie Cancel. Dr. Johnsie Cancel doesn't have any open appts prior to this date. Please call back and advise.

## 2015-08-10 NOTE — Telephone Encounter (Signed)
[name] [name] from [facility] is aware of pt's appointment with the PA on 8/19 at 10:30 AM.

## 2015-08-11 ENCOUNTER — Other Ambulatory Visit: Payer: Self-pay

## 2015-08-16 ENCOUNTER — Telehealth: Payer: Self-pay | Admitting: *Deleted

## 2015-08-16 DIAGNOSIS — Z01818 Encounter for other preprocedural examination: Secondary | ICD-10-CM

## 2015-08-16 NOTE — Telephone Encounter (Signed)
PER DR  NISHAN  PT  NEEDS STRESS MYOVIEW  PRIOR TO RIGHT  CEA  THIS  WEEK WILL FORWARD  MESSAGE TO  [place]  TO SCHEDULE./CY

## 2015-08-17 ENCOUNTER — Telehealth (HOSPITAL_COMMUNITY): Payer: Self-pay

## 2015-08-17 NOTE — Pre-Procedure Instructions (Signed)
    [name] [name]  08/17/2015      [id number] [place], [place] - [address] [place] [address] Phone: [phone] Fax: [phone]    Your procedure is scheduled on Friday, August 26, 2015  Report to [facility] Admitting at 5:30 A.M.  Call this number if you have problems the morning of surgery:  [phone]   Remember: Stop Plavix Sunday, 8/21 as advised by Dr Donnetta Hutching  Do not eat food or drink liquids after midnight Thursday, August 25, 2015  Take these medicines the morning of surgery with A SIP OF WATER :  none    Stop taking vitamins and herbal medications. Do not take any NSAIDs ie: Ibuprofen, Advil, Naproxen, BC's and Goody's; stop 1 week prior to procedure ( Friday, August 19, 2015).   Do not wear jewelry.  Do not wear lotions, powders. You may not wear deodorant.  Do not shave 48 hours prior to surgery.  Men may shave face and neck.  Do not bring valuables to the hospital.  [facility] is not responsible for any belongings or valuables.  Contacts, dentures or bridgework may not be worn into surgery.  Leave your suitcase in the car.  After surgery it may be brought to your room.  For patients admitted to the hospital, discharge time will be determined by your treatment team.  Patients discharged the day of surgery will not be allowed to drive home.     Special instructions: Shower the night before surgery and the morning of surgery with CHG.  Please read over the following fact sheets that you were given. Pain Booklet, Coughing and Deep Breathing, Blood Transfusion Information, MRSA Information and Surgical Site Infection Prevention

## 2015-08-17 NOTE — Telephone Encounter (Signed)
Patient given detailed instructions per Myocardial Perfusion Study Information Sheet for test on 08-18-2015 at 1215. Patient Notified to arrive 15 minutes early, and that it is imperative to arrive on time for appointment to keep from having the test rescheduled. Patient verbalized understanding. Oletta Lamas, Valoria Tamburri A

## 2015-08-18 ENCOUNTER — Ambulatory Visit (HOSPITAL_BASED_OUTPATIENT_CLINIC_OR_DEPARTMENT_OTHER): Payer: Medicare Other

## 2015-08-18 ENCOUNTER — Encounter (HOSPITAL_COMMUNITY): Payer: Self-pay

## 2015-08-18 ENCOUNTER — Encounter (HOSPITAL_COMMUNITY)
Admission: RE | Admit: 2015-08-18 | Discharge: 2015-08-18 | Disposition: A | Discharge status: Home / Self Care | Payer: Medicare Other | Source: Ambulatory Visit | Attending: Vascular Surgery | Admitting: Vascular Surgery

## 2015-08-18 DIAGNOSIS — I129 Hypertensive chronic kidney disease with stage 1 through stage 4 chronic kidney disease, or unspecified chronic kidney disease: Secondary | ICD-10-CM | POA: Insufficient documentation

## 2015-08-18 DIAGNOSIS — I6523 Occlusion and stenosis of bilateral carotid arteries: Secondary | ICD-10-CM | POA: Diagnosis not present

## 2015-08-18 DIAGNOSIS — Z01812 Encounter for preprocedural laboratory examination: Secondary | ICD-10-CM | POA: Insufficient documentation

## 2015-08-18 DIAGNOSIS — R9439 Abnormal result of other cardiovascular function study: Secondary | ICD-10-CM | POA: Insufficient documentation

## 2015-08-18 DIAGNOSIS — Z01818 Encounter for other preprocedural examination: Secondary | ICD-10-CM | POA: Insufficient documentation

## 2015-08-18 DIAGNOSIS — E780 Pure hypercholesterolemia: Secondary | ICD-10-CM | POA: Diagnosis not present

## 2015-08-18 DIAGNOSIS — N189 Chronic kidney disease, unspecified: Secondary | ICD-10-CM | POA: Insufficient documentation

## 2015-08-18 DIAGNOSIS — Z0183 Encounter for blood typing: Secondary | ICD-10-CM | POA: Insufficient documentation

## 2015-08-18 DIAGNOSIS — I2510 Atherosclerotic heart disease of native coronary artery without angina pectoris: Secondary | ICD-10-CM | POA: Diagnosis not present

## 2015-08-18 DIAGNOSIS — Z951 Presence of aortocoronary bypass graft: Secondary | ICD-10-CM | POA: Insufficient documentation

## 2015-08-18 DIAGNOSIS — Z79899 Other long term (current) drug therapy: Secondary | ICD-10-CM | POA: Insufficient documentation

## 2015-08-18 DIAGNOSIS — Z8673 Personal history of transient ischemic attack (TIA), and cerebral infarction without residual deficits: Secondary | ICD-10-CM | POA: Diagnosis not present

## 2015-08-18 DIAGNOSIS — Z7902 Long term (current) use of antithrombotics/antiplatelets: Secondary | ICD-10-CM | POA: Insufficient documentation

## 2015-08-18 DIAGNOSIS — Z7982 Long term (current) use of aspirin: Secondary | ICD-10-CM | POA: Insufficient documentation

## 2015-08-18 LAB — COMPREHENSIVE METABOLIC PANEL
ALT: 27 U/L (ref 17–63)
AST: 29 U/L (ref 15–41)
Albumin: 3.9 g/dL (ref 3.5–5.0)
Alkaline Phosphatase: 63 U/L (ref 38–126)
Anion gap: 6 (ref 5–15)
BUN: 16 mg/dL (ref 6–20)
CO2: 29 mmol/L (ref 22–32)
Calcium: 9.1 mg/dL (ref 8.9–10.3)
Chloride: 103 mmol/L (ref 101–111)
Creatinine, Ser: 1.18 mg/dL (ref 0.61–1.24)
GFR calc Af Amer: >60 mL/min (ref >60)
GFR calc non Af Amer: >60 mL/min (ref >60)
Glucose, Bld: 116 mg/dL — ABNORMAL HIGH (ref 65–99)
Potassium: 4.1 mmol/L (ref 3.5–5.1)
Sodium: 138 mmol/L (ref 135–145)
Total Bilirubin: 0.9 mg/dL (ref 0.3–1.2)
Total Protein: 7 g/dL (ref 6.5–8.1)

## 2015-08-18 LAB — CBC
HCT: 45.2 % (ref 39.0–52.0)
Hemoglobin: 15.8 g/dL (ref 13.0–17.0)
MCH: 34.3 pg — ABNORMAL HIGH (ref 26.0–34.0)
MCHC: 35 g/dL (ref 30.0–36.0)
MCV: 98.3 fL (ref 78.0–100.0)
Platelets: 182 10*3/uL (ref 150–400)
RBC: 4.6 MIL/uL (ref 4.22–5.81)
RDW: 13.2 % (ref 11.5–15.5)
WBC: 6.3 10*3/uL (ref 4.0–10.5)

## 2015-08-18 LAB — URINALYSIS, ROUTINE W REFLEX MICROSCOPIC
Bilirubin Urine: NEGATIVE
Glucose, UA: NEGATIVE mg/dL
Hgb urine dipstick: NEGATIVE
Ketones, ur: NEGATIVE mg/dL
Leukocytes, UA: NEGATIVE
Nitrite: NEGATIVE
Protein, ur: NEGATIVE mg/dL
Specific Gravity, Urine: 1.01 (ref 1.005–1.030)
Urobilinogen, UA: 0.2 mg/dL (ref 0.0–1.0)
pH: 5 (ref 5.0–8.0)

## 2015-08-18 LAB — ABO/RH: ABO/RH(D): A POS

## 2015-08-18 LAB — MYOCARDIAL PERFUSION IMAGING
LV dias vol: 106 mL
LV sys vol: 47 mL
Peak HR: 85 {beats}/min
RATE: 0.42
Rest HR: 58 {beats}/min
SDS: 6
SRS: 3
SSS: 9
TID: 0.97

## 2015-08-18 LAB — PROTIME-INR
INR: 1.04 (ref 0.00–1.49)
Prothrombin Time: 13.8 seconds (ref 11.6–15.2)

## 2015-08-18 LAB — TYPE AND SCREEN
ABO/RH(D): A POS
Antibody Screen: NEGATIVE

## 2015-08-18 LAB — SURGICAL PCR SCREEN
MRSA, PCR: NEGATIVE
Staphylococcus aureus: NEGATIVE

## 2015-08-18 LAB — APTT: aPTT: 29 seconds (ref 24–37)

## 2015-08-18 MED ORDER — TECHNETIUM TC 99M SESTAMIBI GENERIC - CARDIOLITE
10.9000 | Freq: Once | INTRAVENOUS | Status: AC | PRN
  Administered 2015-08-18: 10.9 via INTRAVENOUS

## 2015-08-18 MED ORDER — REGADENOSON 0.4 MG/5ML IV SOLN
0.4000 mg | Freq: Once | INTRAVENOUS | Status: AC
  Administered 2015-08-18: 0.4 mg via INTRAVENOUS

## 2015-08-18 MED ORDER — TECHNETIUM TC 99M SESTAMIBI GENERIC - CARDIOLITE
32.8000 | Freq: Once | INTRAVENOUS | Status: AC | PRN
  Administered 2015-08-18: 32.8 via INTRAVENOUS

## 2015-08-18 NOTE — Progress Notes (Signed)
   08/18/15 0844  OBSTRUCTIVE SLEEP APNEA  Have you ever been diagnosed with sleep apnea through a sleep study? No  Do you snore loudly (loud enough to be heard through closed doors)?  0  Do you often feel tired, fatigued, or sleepy during the daytime? 0  Has anyone observed you stop breathing during your sleep? 0  Do you have, or are you being treated for high blood pressure? 1  BMI more than 35 kg/m2? 0  Age over 72 years old? 1  Neck circumference greater than 40 cm/16 inches? 1 (18)  Gender: 1

## 2015-08-18 NOTE — Progress Notes (Signed)
Results faxed to PCP, Dr Teressa Lower

## 2015-08-19 ENCOUNTER — Encounter: Payer: Self-pay | Admitting: Cardiology

## 2015-08-19 ENCOUNTER — Ambulatory Visit (INDEPENDENT_AMBULATORY_CARE_PROVIDER_SITE_OTHER): Payer: Medicare Other | Admitting: Cardiology

## 2015-08-19 VITALS — BP 140/58 | HR 88 | Ht 74.0 in | Wt 226.0 lb

## 2015-08-19 DIAGNOSIS — I10 Essential (primary) hypertension: Secondary | ICD-10-CM | POA: Diagnosis not present

## 2015-08-19 DIAGNOSIS — I2510 Atherosclerotic heart disease of native coronary artery without angina pectoris: Secondary | ICD-10-CM | POA: Diagnosis not present

## 2015-08-19 DIAGNOSIS — I779 Disorder of arteries and arterioles, unspecified: Secondary | ICD-10-CM | POA: Diagnosis not present

## 2015-08-19 DIAGNOSIS — E785 Hyperlipidemia, unspecified: Secondary | ICD-10-CM

## 2015-08-19 DIAGNOSIS — I739 Peripheral vascular disease, unspecified: Secondary | ICD-10-CM

## 2015-08-19 NOTE — Progress Notes (Signed)
Anesthesia Chart Review: Patient is a 74 year old male scheduled for right CEA on 08/26/15 on Dr. Donnetta Hutching.  History includes CAD s/p CABG '05, TIA '10, non-smoker, HTN, hypercholesterolemia, neurodermatitis, gynecomastia, CRI, carotid occlusive disease. PCP is listed as Dr. Teressa Lower. Primary cardiologist is Dr. Johnsie Cancel.  Patient was seen by Cecilie Kicks, NP with [facility] today for preoperative evaluation. Recent stress test findings reviewed by Dr. Acie Fredrickson and Dr. Johnsie Cancel, and patient was cleared for surgery.  Meds include ASA, Lipitor, Plavix, Microzide, Nitro, Diovan. He is to told Plavix five days prior to surgery.  08/19/15 EKG by report in Epic showed: "SR no acute changes from 2015."   Last cardiac cath seen was from 01/2004 prior to his CABG.  08/02/15 Carotid duplex: 80-99% stenosis RICA. 93-73% LICA. Antegrade vertebral flow.  08/18/15 Nuclear stress test:  Nuclear stress EF: 56%.  There was no ST segment deviation noted during stress.  Defect 1: There is a medium defect of moderate severity present in the basal inferior, basal inferolateral, mid inferior and mid inferolateral location. May be related to diaphragmatic attenuation, but cannot rule out infarct with peri-infarct ischemia.  This is a low risk study.  Findings consistent with prior myocardial infarction with peri-infarct ischemia.  The left ventricular ejection fraction is normal (55-65%).  Preoperative labs noted.   Patient with recent cardiology evaluation and testing and felt okay for surgery.  If no acute changes then I anticipate that he can proceed as planned.   If EKG report from today is not scanned into Epic by 08/26/15, then he will need a new EKG on arrival. I'll leave for PAT RN to follow-up.  George Hugh [facility]/Anesthesiology Phone [phone] 08/19/2015 4:47 PM

## 2015-08-19 NOTE — Progress Notes (Signed)
Cardiology Office Note   Date:  08/19/2015   ID:  [name] [name], DOB Feb 07, 1941, MRN [id number]  PCP:  Noralee Space, MD  Cardiologist:  Dr. Johnsie Cancel    Chief Complaint  Patient presents with  . cornoary atherosclerosis    for surgical clearance for Rt CEA      History of Present Illness: [name] [name] is a 74 y.o. male who presents for eval for surgical clearance.  Has  known coronary disease with previous coronary bypass surgery in 2005. he has normal LV function.  His risk factors are well modified  No chest pain or PND orthopnea is really active.  He had a TIA 2010 with mild facial droop for about 3 minutes. On Plavix. BP a little high. Norvasc caused swelling of feet and stopped.  Last carotid 7/15 at [facility] with Dr Early showed 60-79% RICA and 59-74% LICA. Now with recent dopplers he was found to have progression to critical right carotid stenosis.  Rt with 80-99% stenosis and increased velocities.   Lt carotid with 40-59% stenosis.  He remains asymptomatic  Dr. Johnsie Cancel notified of need for rt CEA and lexiscan myoivew was ordered.   Results were negative for ischemia.    Nuclear stress EF: 56%.  There was no ST segment deviation noted during stress.  Defect 1: There is a medium defect of moderate severity present in the basal inferior, basal inferolateral, mid inferior and mid inferolateral location. May be related to diaphragmatic attenuation, but cannot rule out infarct with peri-infarct ischemia.  This is a low risk study.  Findings consistent with prior myocardial infarction with peri-infarct ischemia.  The left ventricular ejection fraction is normal (55-65%).    Past Medical History  Diagnosis Date  . Hypertension   . Cerebrovascular disease   . Renal insufficiency   . Hypercholesterolemia   . Overweight(278.02)   . Personal history of colonic polyps 10/08/2006    tubular adenomas  . Shoulder pain   . Vitamin D deficiency   . Gynecomastia   .  Transient ischemic attack   . Keloid   . Neurodermatitis   . Carotid artery occlusion   . Coronary artery disease     s/p CABG 2005; sees Dr Johnsie Cancel yearly    Past Surgical History  Procedure Laterality Date  . Pilonidial cystectomy  1989  . Keloid surgery on chest scar  04/2008    Dr. Dessie [name]  . Coronary artery bypass graft  Feb. 2005    4 vessel     Current Outpatient Prescriptions  Medication Sig Dispense Refill  . aspirin 81 MG tablet Take 81 mg by mouth daily.      [name] atorvastatin (LIPITOR) 20 MG tablet Take 0.5 tablets (10 mg total) by mouth daily. Take as directed 30 tablet 11  . Cholecalciferol (VITAMIN D) 2000 UNITS CAPS Take 1 capsule by mouth daily.     . clopidogrel (PLAVIX) 75 MG tablet Take 1 tablet (75 mg total) by mouth daily. 90 tablet 0  . hydrochlorothiazide (MICROZIDE) 12.5 MG capsule TAKE 1 CAPSULE (12.5 MG TOTAL) BY MOUTH DAILY. 30 capsule 1  . nitroGLYCERIN (NITROSTAT) 0.4 MG SL tablet Place 1 tablet (0.4 mg total) under the tongue every 5 (five) minutes as needed for chest pain. 25 tablet 3  . valsartan (DIOVAN) 160 MG tablet TAKE 1 TABLET EVERY DAY 90 tablet 1   No current facility-administered medications for this visit.    Allergies:   Niacin    Social History:  The patient  reports that he has never smoked. He quit smokeless tobacco use about 13 months ago. His smokeless tobacco use included Chew. He reports that he does not drink alcohol or use illicit drugs.   Family History:  The patient's family history includes Cancer in his father; Diabetes in his brother, mother, and sister; Heart attack in his mother; Heart disease in his brother and mother; Kidney disease in his mother; Lung cancer in his paternal uncle. There is no history of Colon cancer.    ROS:  General:no colds or fevers, no weight changes Skin:no rashes or ulcers HEENT:no blurred vision, no congestion CV:see HPI PUL:see HPI GI:no diarrhea constipation or melena, no  indigestion GU:no hematuria, no dysuria MS:no joint pain, no claudication Neuro:no syncope, no lightheadedness Endo:no diabetes, no thyroid disease  Wt Readings from Last 3 Encounters:  08/19/15 226 lb (102.513 kg)  08/18/15 225 lb (102.059 kg)  08/18/15 227 lb 3.2 oz (103.057 kg)     PHYSICAL EXAM: VS:  BP 140/58 mmHg  Pulse 88  Ht 6\' 2"  (1.88 m)  Wt 226 lb (102.513 kg)  BMI 29.00 kg/m2  SpO2 97% , BMI Body mass index is 29 kg/(m^2). General:Pleasant affect, NAD Skin:Warm and dry, brisk capillary refill HEENT:normocephalic, sclera clear, mucus membranes moist Neck:supple, no JVD, no bruits  Heart:S1S2 RRR without murmur, gallup, rub or click Lungs:clear without rales, rhonchi, or wheezes WOE:HOZY, non tender, + BS, do not palpate liver spleen or masses Ext:no lower ext edema, 2+ pedal pulses, 2+ radial pulses Neuro:alert and oriented X 3, MAE, follows commands, + facial symmetry    EKG:  EKG is ordered today. The ekg ordered today demonstrates SR no acute changes from 2015.   Recent Labs: 07/25/2015: TSH 3.01 08/18/2015: ALT 27; BUN 16; Creatinine, Ser 1.18; Hemoglobin 15.8; Platelets 182; Potassium 4.1; Sodium 138    Lipid Panel    Component Value Date/Time   CHOL 129 07/25/2015 0733   TRIG 182.0* 07/25/2015 0733   HDL 30.80* 07/25/2015 0733   CHOLHDL 4 07/25/2015 0733   VLDL 36.4 07/25/2015 0733   LDLCALC 62 07/25/2015 0733       Other studies Reviewed: Additional studies/ records that were reviewed today include: previous notes, nuc study.   ASSESSMENT AND PLAN:  1.  CAD s/p CABG 2005, no angina, neg nuc study for ischemia reviewed with DOD Dr. Acie Fredrickson and Dr. Johnsie Cancel had reviewed stress test and felt he was cleared for surgery.  2. Critical carotid disease, for CEA with Dr. Donnetta Hutching- Dr. Donnetta Hutching to direct plavix if need to stop.  3.  HTN  Controlled  4. Hx TIA on ASA.   5. Hyperlipidemia stable on statin. Last LDL <70  Follow up with nishan in 6  months Current medicines are reviewed with the patient today.  The patient Has no concerns regarding medicines.  The following changes have been made:  See above Labs/ tests ordered today include:see above  Disposition:   FU:  see above  Signed, Isaiah Serge, NP  08/19/2015 11:55 AM    [facility] [address], [place], [place], [place] Phone: [phone]; Fax: [phone]

## 2015-08-19 NOTE — Patient Instructions (Signed)
Medication Instructions:  Your physician recommends that you continue on your current medications as directed. Please refer to the Current Medication list given to you today.     Labwork:  NONE ORDER TODAY    Testing/Procedures:  NONE ORDER TODAY    Follow-Up:  Your physician wants you to follow-up in:  IN [place] will receive a reminder letter in the mail two months in advance. If you don't receive a letter, please call our office to schedule the follow-up appointment.      Any Other Special Instructions Will Be Listed Below (If Applicable).

## 2015-08-22 NOTE — Progress Notes (Signed)
Message left for medical records at Dr Kyla Balzarine office to fax over ekg from 08-19-15.

## 2015-08-25 MED ORDER — SODIUM CHLORIDE 0.9 % IV SOLN: INTRAVENOUS | Status: DC

## 2015-08-25 MED ORDER — CHLORHEXIDINE GLUCONATE CLOTH 2 % EX PADS: 6.0000 | MEDICATED_PAD | Freq: Once | CUTANEOUS | Status: DC

## 2015-08-25 MED ORDER — DEXTROSE 5 % IV SOLN
1.5000 g | INTRAVENOUS | Status: AC
  Administered 2015-08-26: 1.5 g via INTRAVENOUS
  Filled 2015-08-25: qty 1.5

## 2015-08-26 ENCOUNTER — Inpatient Hospital Stay (HOSPITAL_COMMUNITY): Payer: Medicare Other | Admitting: Certified Registered"

## 2015-08-26 ENCOUNTER — Encounter (HOSPITAL_COMMUNITY): Payer: Self-pay | Admitting: *Deleted

## 2015-08-26 ENCOUNTER — Encounter (HOSPITAL_COMMUNITY)
Admission: RE | Disposition: A | Discharge status: Home / Self Care | Payer: Self-pay | Source: Ambulatory Visit | Attending: Vascular Surgery

## 2015-08-26 ENCOUNTER — Inpatient Hospital Stay (HOSPITAL_COMMUNITY)
Admission: RE | Admit: 2015-08-26 | Discharge: 2015-08-27 | DRG: 039 | Disposition: A | Discharge status: Home / Self Care | Payer: Medicare Other | Source: Ambulatory Visit | Attending: Vascular Surgery | Admitting: Vascular Surgery

## 2015-08-26 ENCOUNTER — Inpatient Hospital Stay (HOSPITAL_COMMUNITY): Payer: Medicare Other | Admitting: Vascular Surgery

## 2015-08-26 DIAGNOSIS — Z8673 Personal history of transient ischemic attack (TIA), and cerebral infarction without residual deficits: Secondary | ICD-10-CM

## 2015-08-26 DIAGNOSIS — Z951 Presence of aortocoronary bypass graft: Secondary | ICD-10-CM | POA: Diagnosis not present

## 2015-08-26 DIAGNOSIS — E119 Type 2 diabetes mellitus without complications: Secondary | ICD-10-CM | POA: Diagnosis present

## 2015-08-26 DIAGNOSIS — I10 Essential (primary) hypertension: Secondary | ICD-10-CM | POA: Diagnosis present

## 2015-08-26 DIAGNOSIS — Z79899 Other long term (current) drug therapy: Secondary | ICD-10-CM

## 2015-08-26 DIAGNOSIS — I6521 Occlusion and stenosis of right carotid artery: Secondary | ICD-10-CM | POA: Diagnosis not present

## 2015-08-26 DIAGNOSIS — E780 Pure hypercholesterolemia: Secondary | ICD-10-CM | POA: Diagnosis present

## 2015-08-26 DIAGNOSIS — I2510 Atherosclerotic heart disease of native coronary artery without angina pectoris: Secondary | ICD-10-CM | POA: Diagnosis present

## 2015-08-26 DIAGNOSIS — E663 Overweight: Secondary | ICD-10-CM | POA: Diagnosis present

## 2015-08-26 DIAGNOSIS — Z7982 Long term (current) use of aspirin: Secondary | ICD-10-CM | POA: Diagnosis not present

## 2015-08-26 DIAGNOSIS — Z6829 Body mass index (BMI) 29.0-29.9, adult: Secondary | ICD-10-CM | POA: Diagnosis not present

## 2015-08-26 DIAGNOSIS — I6529 Occlusion and stenosis of unspecified carotid artery: Secondary | ICD-10-CM | POA: Diagnosis present

## 2015-08-26 DIAGNOSIS — Z7902 Long term (current) use of antithrombotics/antiplatelets: Secondary | ICD-10-CM

## 2015-08-26 DIAGNOSIS — E559 Vitamin D deficiency, unspecified: Secondary | ICD-10-CM | POA: Diagnosis not present

## 2015-08-26 HISTORY — PX: ENDARTERECTOMY: SHX5162

## 2015-08-26 HISTORY — PX: CAROTID ENDARTERECTOMY: SUR193

## 2015-08-26 LAB — CBC
HCT: 41.6 % (ref 39.0–52.0)
Hemoglobin: 14.8 g/dL (ref 13.0–17.0)
MCH: 34.6 pg — ABNORMAL HIGH (ref 26.0–34.0)
MCHC: 35.6 g/dL (ref 30.0–36.0)
MCV: 97.2 fL (ref 78.0–100.0)
Platelets: 179 10*3/uL (ref 150–400)
RBC: 4.28 MIL/uL (ref 4.22–5.81)
RDW: 13.3 % (ref 11.5–15.5)
WBC: 9.5 10*3/uL (ref 4.0–10.5)

## 2015-08-26 LAB — CREATININE, SERUM
Creatinine, Ser: 1.15 mg/dL (ref 0.61–1.24)
GFR calc Af Amer: >60 mL/min (ref >60)
GFR calc non Af Amer: >60 mL/min (ref >60)

## 2015-08-26 SURGERY — ENDARTERECTOMY, CAROTID
Anesthesia: General | Site: Neck | Laterality: Right

## 2015-08-26 MED ORDER — ENOXAPARIN SODIUM 40 MG/0.4ML ~~LOC~~ SOLN
40.0000 mg | SUBCUTANEOUS | Status: DC
  Administered 2015-08-27: 40 mg via SUBCUTANEOUS
  Filled 2015-08-26 (×2): qty 0.4

## 2015-08-26 MED ORDER — ONDANSETRON HCL 4 MG/2ML IJ SOLN
INTRAMUSCULAR | Status: DC | PRN
  Administered 2015-08-26 (×2): 4 mg via INTRAVENOUS

## 2015-08-26 MED ORDER — HYDROMORPHONE HCL 1 MG/ML IJ SOLN
0.2500 mg | INTRAMUSCULAR | Status: DC | PRN
  Administered 2015-08-26: 1 mg via INTRAVENOUS

## 2015-08-26 MED ORDER — ASPIRIN EC 81 MG PO TBEC
81.0000 mg | DELAYED_RELEASE_TABLET | Freq: Every day | ORAL | Status: DC
  Filled 2015-08-26: qty 1

## 2015-08-26 MED ORDER — EPHEDRINE SULFATE 50 MG/ML IJ SOLN
INTRAMUSCULAR | Status: AC
  Filled 2015-08-26: qty 1

## 2015-08-26 MED ORDER — SODIUM CHLORIDE 0.9 % IJ SOLN
INTRAMUSCULAR | Status: AC
  Filled 2015-08-26: qty 10

## 2015-08-26 MED ORDER — LABETALOL HCL 5 MG/ML IV SOLN
INTRAVENOUS | Status: AC
  Filled 2015-08-26: qty 4

## 2015-08-26 MED ORDER — PANTOPRAZOLE SODIUM 40 MG PO TBEC: 40.0000 mg | DELAYED_RELEASE_TABLET | Freq: Every day | ORAL | Status: DC

## 2015-08-26 MED ORDER — BISACODYL 10 MG RE SUPP: 10.0000 mg | Freq: Every day | RECTAL | Status: DC | PRN

## 2015-08-26 MED ORDER — OXYCODONE-ACETAMINOPHEN 5-325 MG PO TABS: 1.0000 | ORAL_TABLET | Freq: Four times a day (QID) | ORAL | Status: DC | PRN

## 2015-08-26 MED ORDER — LIDOCAINE HCL (CARDIAC) 20 MG/ML IV SOLN
INTRAVENOUS | Status: DC | PRN
  Administered 2015-08-26 (×2): 60 mg via INTRAVENOUS

## 2015-08-26 MED ORDER — PHENYLEPHRINE 40 MCG/ML (10ML) SYRINGE FOR IV PUSH (FOR BLOOD PRESSURE SUPPORT)
PREFILLED_SYRINGE | INTRAVENOUS | Status: AC
  Filled 2015-08-26: qty 10

## 2015-08-26 MED ORDER — 0.9 % SODIUM CHLORIDE (POUR BTL) OPTIME
TOPICAL | Status: DC | PRN
  Administered 2015-08-26 (×2): 1000 mL

## 2015-08-26 MED ORDER — HYDROCHLOROTHIAZIDE 12.5 MG PO CAPS
12.5000 mg | ORAL_CAPSULE | Freq: Every day | ORAL | Status: DC
  Filled 2015-08-26: qty 1

## 2015-08-26 MED ORDER — PHENYLEPHRINE HCL 10 MG/ML IJ SOLN
20.0000 mg | INTRAVENOUS | Status: DC | PRN
  Administered 2015-08-26: 30 ug/min via INTRAVENOUS

## 2015-08-26 MED ORDER — HYDRALAZINE HCL 20 MG/ML IJ SOLN: 5.0000 mg | INTRAMUSCULAR | Status: DC | PRN

## 2015-08-26 MED ORDER — SENNOSIDES-DOCUSATE SODIUM 8.6-50 MG PO TABS
1.0000 | ORAL_TABLET | Freq: Every evening | ORAL | Status: DC | PRN
  Filled 2015-08-26: qty 1

## 2015-08-26 MED ORDER — FENTANYL CITRATE (PF) 250 MCG/5ML IJ SOLN
INTRAMUSCULAR | Status: DC | PRN
  Administered 2015-08-26: 100 ug via INTRAVENOUS

## 2015-08-26 MED ORDER — PROTAMINE SULFATE 10 MG/ML IV SOLN
INTRAVENOUS | Status: DC | PRN
  Administered 2015-08-26: 20 mg via INTRAVENOUS
  Administered 2015-08-26 (×3): 10 mg via INTRAVENOUS

## 2015-08-26 MED ORDER — ROCURONIUM BROMIDE 100 MG/10ML IV SOLN
INTRAVENOUS | Status: DC | PRN
  Administered 2015-08-26: 50 mg via INTRAVENOUS

## 2015-08-26 MED ORDER — HYDROMORPHONE HCL 1 MG/ML IJ SOLN
INTRAMUSCULAR | Status: AC
  Filled 2015-08-26: qty 1

## 2015-08-26 MED ORDER — GLYCOPYRROLATE 0.2 MG/ML IJ SOLN
INTRAMUSCULAR | Status: AC
  Filled 2015-08-26: qty 2

## 2015-08-26 MED ORDER — POTASSIUM CHLORIDE CRYS ER 20 MEQ PO TBCR: 20.0000 meq | EXTENDED_RELEASE_TABLET | Freq: Every day | ORAL | Status: DC | PRN

## 2015-08-26 MED ORDER — LACTATED RINGERS IV SOLN
INTRAVENOUS | Status: DC | PRN
  Administered 2015-08-26 (×2): via INTRAVENOUS

## 2015-08-26 MED ORDER — LIDOCAINE HCL (CARDIAC) 20 MG/ML IV SOLN
INTRAVENOUS | Status: AC
  Filled 2015-08-26: qty 5

## 2015-08-26 MED ORDER — ACETAMINOPHEN 650 MG RE SUPP: 325.0000 mg | RECTAL | Status: DC | PRN

## 2015-08-26 MED ORDER — DOCUSATE SODIUM 100 MG PO CAPS: 100.0000 mg | ORAL_CAPSULE | Freq: Every day | ORAL | Status: DC

## 2015-08-26 MED ORDER — PROPOFOL 10 MG/ML IV BOLUS
INTRAVENOUS | Status: AC
  Filled 2015-08-26: qty 20

## 2015-08-26 MED ORDER — CLOPIDOGREL BISULFATE 75 MG PO TABS
75.0000 mg | ORAL_TABLET | Freq: Every day | ORAL | Status: DC
  Filled 2015-08-26: qty 1

## 2015-08-26 MED ORDER — PROPOFOL 10 MG/ML IV BOLUS
INTRAVENOUS | Status: DC | PRN
  Administered 2015-08-26: 100 mg via INTRAVENOUS

## 2015-08-26 MED ORDER — MORPHINE SULFATE (PF) 2 MG/ML IV SOLN: 2.0000 mg | INTRAVENOUS | Status: DC | PRN

## 2015-08-26 MED ORDER — SODIUM CHLORIDE 0.9 % IV SOLN
INTRAVENOUS | Status: DC
Start: 2015-08-26 — End: 2015-08-27
  Administered 2015-08-26: 15:00:00 via INTRAVENOUS

## 2015-08-26 MED ORDER — ONDANSETRON HCL 4 MG/2ML IJ SOLN
INTRAMUSCULAR | Status: AC
  Filled 2015-08-26: qty 2

## 2015-08-26 MED ORDER — SODIUM CHLORIDE 0.9 % IR SOLN
Status: DC | PRN
  Administered 2015-08-26: 500 mL

## 2015-08-26 MED ORDER — ATORVASTATIN CALCIUM 10 MG PO TABS
10.0000 mg | ORAL_TABLET | Freq: Every day | ORAL | Status: DC
  Administered 2015-08-26: 10 mg via ORAL
  Filled 2015-08-26 (×2): qty 1

## 2015-08-26 MED ORDER — MAGNESIUM SULFATE 2 GM/50ML IV SOLN: 2.0000 g | Freq: Every day | INTRAVENOUS | Status: DC | PRN

## 2015-08-26 MED ORDER — IRBESARTAN 150 MG PO TABS
150.0000 mg | ORAL_TABLET | Freq: Every day | ORAL | Status: DC
  Filled 2015-08-26: qty 1

## 2015-08-26 MED ORDER — PROMETHAZINE HCL 25 MG/ML IJ SOLN: 6.2500 mg | INTRAMUSCULAR | Status: DC | PRN

## 2015-08-26 MED ORDER — OXYCODONE-ACETAMINOPHEN 5-325 MG PO TABS: 1.0000 | ORAL_TABLET | ORAL | Status: DC | PRN

## 2015-08-26 MED ORDER — MIDAZOLAM HCL 2 MG/2ML IJ SOLN: 0.5000 mg | Freq: Once | INTRAMUSCULAR | Status: DC | PRN

## 2015-08-26 MED ORDER — SUCCINYLCHOLINE CHLORIDE 20 MG/ML IJ SOLN
INTRAMUSCULAR | Status: AC
  Filled 2015-08-26: qty 1

## 2015-08-26 MED ORDER — ACETAMINOPHEN 325 MG PO TABS
325.0000 mg | ORAL_TABLET | ORAL | Status: DC | PRN
  Administered 2015-08-26: 650 mg via ORAL
  Filled 2015-08-26: qty 2

## 2015-08-26 MED ORDER — PHENOL 1.4 % MT LIQD: 1.0000 | OROMUCOSAL | Status: DC | PRN

## 2015-08-26 MED ORDER — SODIUM CHLORIDE 0.9 % IV SOLN
0.0125 ug/kg/min | INTRAVENOUS | Status: AC
  Administered 2015-08-26: 100 ug via INTRAVENOUS
  Filled 2015-08-26: qty 2000

## 2015-08-26 MED ORDER — MEPERIDINE HCL 25 MG/ML IJ SOLN: 6.2500 mg | INTRAMUSCULAR | Status: DC | PRN

## 2015-08-26 MED ORDER — ALUM & MAG HYDROXIDE-SIMETH 200-200-20 MG/5ML PO SUSP: 15.0000 mL | ORAL | Status: DC | PRN

## 2015-08-26 MED ORDER — NEOSTIGMINE METHYLSULFATE 10 MG/10ML IV SOLN
INTRAVENOUS | Status: AC
  Filled 2015-08-26: qty 1

## 2015-08-26 MED ORDER — FENTANYL CITRATE (PF) 250 MCG/5ML IJ SOLN
INTRAMUSCULAR | Status: AC
  Filled 2015-08-26: qty 5

## 2015-08-26 MED ORDER — LABETALOL HCL 5 MG/ML IV SOLN: 10.0000 mg | INTRAVENOUS | Status: DC | PRN

## 2015-08-26 MED ORDER — ROCURONIUM BROMIDE 50 MG/5ML IV SOLN
INTRAVENOUS | Status: AC
  Filled 2015-08-26: qty 1

## 2015-08-26 MED ORDER — METOPROLOL TARTRATE 1 MG/ML IV SOLN: 2.0000 mg | INTRAVENOUS | Status: DC | PRN

## 2015-08-26 MED ORDER — DEXTROSE 5 % IV SOLN
1.5000 g | Freq: Two times a day (BID) | INTRAVENOUS | Status: AC
  Administered 2015-08-26 – 2015-08-27 (×2): 1.5 g via INTRAVENOUS
  Filled 2015-08-26 (×2): qty 1.5

## 2015-08-26 MED ORDER — NEOSTIGMINE METHYLSULFATE 10 MG/10ML IV SOLN
INTRAVENOUS | Status: DC | PRN
  Administered 2015-08-26: 3 mg via INTRAVENOUS

## 2015-08-26 MED ORDER — NAPHAZOLINE HCL 0.1 % OP SOLN
2.0000 [drp] | Freq: Four times a day (QID) | OPHTHALMIC | Status: DC | PRN
  Filled 2015-08-26: qty 15

## 2015-08-26 MED ORDER — ONDANSETRON HCL 4 MG/2ML IJ SOLN: 4.0000 mg | Freq: Four times a day (QID) | INTRAMUSCULAR | Status: DC | PRN

## 2015-08-26 MED ORDER — HEPARIN SODIUM (PORCINE) 1000 UNIT/ML IJ SOLN
INTRAMUSCULAR | Status: DC | PRN
  Administered 2015-08-26: 9000 [IU] via INTRAVENOUS

## 2015-08-26 MED ORDER — HEPARIN SODIUM (PORCINE) 1000 UNIT/ML IJ SOLN
INTRAMUSCULAR | Status: AC
  Filled 2015-08-26: qty 1

## 2015-08-26 MED ORDER — SODIUM CHLORIDE 0.9 % IV SOLN: 500.0000 mL | Freq: Once | INTRAVENOUS | Status: DC | PRN

## 2015-08-26 MED ORDER — NITROGLYCERIN 0.4 MG SL SUBL: 0.4000 mg | SUBLINGUAL_TABLET | SUBLINGUAL | Status: DC | PRN

## 2015-08-26 MED ORDER — GLYCOPYRROLATE 0.2 MG/ML IJ SOLN
INTRAMUSCULAR | Status: DC | PRN
Start: 2015-08-26 — End: 2015-08-26
  Administered 2015-08-26: 0.4 mg via INTRAVENOUS
  Administered 2015-08-26: .4 mg via INTRAVENOUS

## 2015-08-26 MED ORDER — GUAIFENESIN-DM 100-10 MG/5ML PO SYRP: 15.0000 mL | ORAL_SOLUTION | ORAL | Status: DC | PRN

## 2015-08-26 MED ORDER — LIDOCAINE HCL (PF) 1 % IJ SOLN
INTRAMUSCULAR | Status: AC
  Filled 2015-08-26: qty 30

## 2015-08-26 SURGICAL SUPPLY — 47 items
BENZOIN TINCTURE PRP APPL 2/3 (GAUZE/BANDAGES/DRESSINGS) ×2 IMPLANT
CANISTER SUCTION 2500CC (MISCELLANEOUS) ×2 IMPLANT
CANNULA VESSEL 3MM 2 BLNT TIP (CANNULA) IMPLANT
CATH ROBINSON RED A/P 18FR (CATHETERS) ×2 IMPLANT
CLIP LIGATING EXTRA MED SLVR (CLIP) ×2 IMPLANT
CLIP LIGATING EXTRA SM BLUE (MISCELLANEOUS) ×2 IMPLANT
CRADLE DONUT ADULT HEAD (MISCELLANEOUS) ×2 IMPLANT
DECANTER SPIKE VIAL GLASS SM (MISCELLANEOUS) IMPLANT
DRAIN HEMOVAC 1/8 X 5 (WOUND CARE) IMPLANT
DRSG COVADERM 4X8 (GAUZE/BANDAGES/DRESSINGS) ×2 IMPLANT
ELECT REM PT RETURN 9FT ADLT (ELECTROSURGICAL) ×2
ELECTRODE REM PT RTRN 9FT ADLT (ELECTROSURGICAL) ×1 IMPLANT
EVACUATOR SILICONE 100CC (DRAIN) IMPLANT
GAUZE SPONGE 4X4 12PLY STRL (GAUZE/BANDAGES/DRESSINGS) ×2 IMPLANT
GEL ULTRASOUND 20GR AQUASONIC (MISCELLANEOUS) IMPLANT
GLOVE BIOGEL PI IND STRL 6.5 (GLOVE) ×4 IMPLANT
GLOVE BIOGEL PI IND STRL 7.5 (GLOVE) ×1 IMPLANT
GLOVE BIOGEL PI INDICATOR 6.5 (GLOVE) ×4
GLOVE BIOGEL PI INDICATOR 7.5 (GLOVE) ×1
GLOVE ECLIPSE 6.5 STRL STRAW (GLOVE) ×4 IMPLANT
GLOVE ECLIPSE 7.0 STRL STRAW (GLOVE) ×2 IMPLANT
GLOVE SS BIOGEL STRL SZ 7.5 (GLOVE) ×1 IMPLANT
GLOVE SUPERSENSE BIOGEL SZ 7.5 (GLOVE) ×1
GOWN STRL REUS W/ TWL LRG LVL3 (GOWN DISPOSABLE) ×5 IMPLANT
GOWN STRL REUS W/TWL LRG LVL3 (GOWN DISPOSABLE) ×5
KIT BASIN OR (CUSTOM PROCEDURE TRAY) ×2 IMPLANT
KIT ROOM TURNOVER OR (KITS) ×2 IMPLANT
NEEDLE 22X1 1/2 (OR ONLY) (NEEDLE) IMPLANT
NS IRRIG 1000ML POUR BTL (IV SOLUTION) ×4 IMPLANT
PACK CAROTID (CUSTOM PROCEDURE TRAY) ×2 IMPLANT
PAD ARMBOARD 7.5X6 YLW CONV (MISCELLANEOUS) ×4 IMPLANT
PATCH HEMASHIELD 8X75 (Vascular Products) ×2 IMPLANT
SHUNT CAROTID BYPASS 10 (VASCULAR PRODUCTS) ×2 IMPLANT
SHUNT CAROTID BYPASS 12FRX15.5 (VASCULAR PRODUCTS) IMPLANT
SPONGE GAUZE 4X4 12PLY STER LF (GAUZE/BANDAGES/DRESSINGS) ×2 IMPLANT
SPONGE INTESTINAL PEANUT (DISPOSABLE) ×2 IMPLANT
STRIP CLOSURE SKIN 1/2X4 (GAUZE/BANDAGES/DRESSINGS) ×2 IMPLANT
SUT ETHILON 3 0 PS 1 (SUTURE) IMPLANT
SUT PROLENE 6 0 CC (SUTURE) ×2 IMPLANT
SUT SILK 3 0 (SUTURE)
SUT SILK 3-0 18XBRD TIE 12 (SUTURE) IMPLANT
SUT VIC AB 3-0 SH 27 (SUTURE) ×2
SUT VIC AB 3-0 SH 27X BRD (SUTURE) ×2 IMPLANT
SUT VICRYL 4-0 PS2 18IN ABS (SUTURE) ×2 IMPLANT
SYR CONTROL 10ML LL (SYRINGE) IMPLANT
TAPE CLOTH SURG 4X10 WHT LF (GAUZE/BANDAGES/DRESSINGS) ×2 IMPLANT
WATER STERILE IRR 1000ML POUR (IV SOLUTION) ×2 IMPLANT

## 2015-08-26 NOTE — Progress Notes (Signed)
UR COMPLETED  

## 2015-08-26 NOTE — Op Note (Signed)
     Patient name: [name] [name] MRN: [id number] DOB: [birth year] Sex: male  08/26/2015 Pre-operative Diagnosis: Asymptomatic right carotid stenosis Post-operative diagnosis:  Same Surgeon:  Rosetta Posner, M.D. Assistants:  Theda Sers Procedure:    right carotid Endarterectomy with Dacron patch angioplasty Anesthesia:  General Blood Loss:  See anesthesia record Specimens:  none  Indications for surgery:  Severe stenosis  Procedure in detail:  The patient was taken to the operating and placed in the supine position. The neck was prepped and draped in the usual sterile fashion. An incision was made anterior to the sternocleidomastoid muscle and continued with electrocautery through the platysma muscle. The muscle was retracted posteriorly and the carotid sheath was opened. The facial vein was ligated with 2-0 silk ties and divided. The common carotid artery was encircled with an umbilical tape and Rummel tourniquet. Dissection was continued onto the carotid bifurcation. The superior thyroid artery was controlled with a 2-0 silk Potts tie. The external carotid organ was encircled with a vessel loop and the internal carotid was encircled with umbilical tape and Rummel tourniquet. The hypoglossal and vagus nerves were identified and preserved.  The patient was given systemic heparinization. After adequate circulation time, the internal,external and common carotid arteries were occluded. The common carotid was opened with an 11 blade and the arteriotomy was continued with Potts scissors onto the internal carotid artery. A 10 shunt was passed up the internal carotid artery, allowed to back bleed, and then passed down the common carotid artery. The shunt was secured with Rummel tourniquet. The endarterectomy was begun on the common carotid artery  plaque was divided proximally with Potts scissors. The endarterectomy was continued onto the carotid bifurcation. The external carotid was endarterectomized by  eversion technique and the internal carotid artery was endarterectomized in an open fashion. Remaining debris was removed from the endarterectomy plane. A Dacron patch was brought to the field and sewn as a patch angioplasty. Prior to completing the anastomosis, the shunt was removed and the usual flushing maneuvers were undertaken. The anastomosis was then completed and flow was restored first to the external and then the internal carotid artery. Excellent flow characteristics were noted with hand-held Doppler in the internal and external carotid arteries.  The patient was given protamine to reverse the heparin. Hemostasis was obtained with electrocautery. The wounds were irrigated with saline. The wound was closed by first reapproximating the sternocleidomastoid muscle over the carotid artery with interrupted 3-0 Vicryl sutures. Next, the platysma was closed with a running 3-0 Vicryl suture. The skin was closed with a 4-0 subcuticular Vicryl suture. Benzoin and Steri-Strips were applied to the incision. A sterile dressing was placed over the incision. All sponge and needle counts were correct. The patient was awakened in the operating room, neurologically intact. They were transferred to the PACU in stable condition.  Carotid stenosis at surgery:>80%  Disposition:  To PACU in stable condition,neurologically intact  Relevant Operative Details:  Low bifurcation  Rosetta Posner, M.D. [facility] Office: [phone] Pager:  [phone]

## 2015-08-26 NOTE — Transfer of Care (Signed)
Immediate Anesthesia Transfer of Care Note  Patient: [name] [name]  Procedure(s) Performed: Procedure(s): Right Carotid ENDARTERECTOMY with Patch Angioplasty  (Right)  Patient Location: PACU  Anesthesia Type:General  Level of Consciousness: awake, alert , oriented and patient cooperative  Airway & Oxygen Therapy: Patient Spontanous Breathing and Patient connected to nasal cannula oxygen  Post-op Assessment: Report given to RN, Post -op Vital signs reviewed and stable and Patient moving all extremities  Post vital signs: Reviewed and stable  Last Vitals:  Filed Vitals:   08/26/15 0555  BP: 159/58  Pulse: 57  Temp: 36.4 C  Resp: 18    Complications: No apparent anesthesia complications

## 2015-08-26 NOTE — Progress Notes (Addendum)
       Alert and oriented x 3 Right neck dressing C/D/I No tongue deviation, smile is symmetric Grip 5/5 equal bil.  S/P right CEA Admit to 3S Plan D/C in am Rankin score 0  COLLINS, EMMA MAUREEN PA-C

## 2015-08-26 NOTE — Anesthesia Postprocedure Evaluation (Signed)
  Anesthesia Post-op Note  Patient: [name] [name]  Procedure(s) Performed: Procedure(s): Right Carotid ENDARTERECTOMY with Patch Angioplasty  (Right)  Patient Location: PACU  Anesthesia Type:General  Level of Consciousness: awake, alert , oriented and patient cooperative  Airway and Oxygen Therapy: Patient Spontanous Breathing and Patient connected to nasal cannula oxygen  Post-op Pain: none  Post-op Assessment: Post-op Vital signs reviewed, Patient's Cardiovascular Status Stable, Respiratory Function Stable, Patent Airway, No signs of Nausea or vomiting and Pain level controlled              Post-op Vital Signs: Reviewed and stable  Last Vitals:  Filed Vitals:   08/26/15 0939  BP: 168/69  Pulse: 84  Temp:   Resp: 12    Complications: No apparent anesthesia complications

## 2015-08-26 NOTE — Anesthesia Preprocedure Evaluation (Addendum)
Anesthesia Evaluation  Patient identified by MRN, date of birth, ID band Patient awake    Reviewed: Allergy & Precautions, NPO status , Patient's Chart, lab work & pertinent test results  History of Anesthesia Complications Negative for: history of anesthetic complications  Airway Mallampati: II  TM Distance: >3 FB Neck ROM: Full    Dental  (+) Dental Advisory Given   Pulmonary neg pulmonary ROS,  breath sounds clear to auscultation        Cardiovascular hypertension, Pt. on medications - angina+ CAD, + CABG (2005) and + Peripheral Vascular Disease Rhythm:Regular Rate:Normal  1/16 Stress: This is a low risk study, prior myocardial infarction with peri-infarct ischemia, EF normal (55-65%).    Neuro/Psych Anxiety TIA   GI/Hepatic Neg liver ROS,   Endo/Other  negative endocrine ROS  Renal/GU negative Renal ROS     Musculoskeletal   Abdominal (+) + obese,   Peds  Hematology negative hematology ROS (+)   Anesthesia Other Findings   Reproductive/Obstetrics                           Anesthesia Physical Anesthesia Plan  ASA: III  Anesthesia Plan: General   Post-op Pain Management:    Induction: Intravenous  Airway Management Planned: Oral ETT  Additional Equipment: Arterial line  Intra-op Plan:   Post-operative Plan: Extubation in OR  Informed Consent: I have reviewed the patients History and Physical, chart, labs and discussed the procedure including the risks, benefits and alternatives for the proposed anesthesia with the patient or authorized representative who has indicated his/her understanding and acceptance.   Dental advisory given  Plan Discussed with: CRNA and Surgeon  Anesthesia Plan Comments: (Plan routine monitors, A line, GETA)        Anesthesia Quick Evaluation

## 2015-08-26 NOTE — Addendum Note (Signed)
Addendum  created 08/26/15 1039 by Julian Reil, CRNA   Modules edited: Charges VN

## 2015-08-26 NOTE — Anesthesia Procedure Notes (Signed)
Procedure Name: Intubation Date/Time: 08/26/2015 7:55 AM Performed by: Julian Reil Pre-anesthesia Checklist: Patient identified, Emergency Drugs available, Suction available and Patient being monitored Patient Re-evaluated:Patient Re-evaluated prior to inductionOxygen Delivery Method: Circle system utilized Preoxygenation: Pre-oxygenation with 100% oxygen Intubation Type: IV induction Ventilation: Mask ventilation without difficulty Laryngoscope Size: Mac and 4 Grade View: Grade I Tube type: Oral Tube size: 7.5 mm Number of attempts: 1 Airway Equipment and Method: Stylet and LTA kit utilized Placement Confirmation: ETT inserted through vocal cords under direct vision,  positive ETCO2 and breath sounds checked- equal and bilateral Secured at: 22 cm Tube secured with: Tape Dental Injury: Teeth and Oropharynx as per pre-operative assessment

## 2015-08-26 NOTE — Interval H&P Note (Signed)
History and Physical Interval Note:  08/26/2015 6:54 AM  [name] [name]  has presented today for surgery, with the diagnosis of Right carotid artery stenosis I65.21  The various methods of treatment have been discussed with the patient and family. After consideration of risks, benefits and other options for treatment, the patient has consented to  Procedure(s): ENDARTERECTOMY CAROTID (Right) as a surgical intervention .  The patient's history has been reviewed, patient examined, no change in status, stable for surgery.  I have reviewed the patient's chart and labs.  Questions were answered to the patient's satisfaction.     Curt Jews

## 2015-08-26 NOTE — H&P (View-Only) (Signed)
Patient name: [name] [name] MRN: [id number] DOB: 22-Dec-1941 Sex: male     Reason for referral:  Chief Complaint  Patient presents with  . Re-evaluation    to discuss R CEA    HISTORY OF PRESENT ILLNESS:  the patient is seen today for continued discussion regarding his severe asymptomatic right internal carotid artery stenosis. He's been followed in our office for a number of years with known bilateral moderate stenosis right greater than left. On recent six-month follow-up he was found to have progression to critical right carotid stenosis. He remains asymptomatic. He did have a TIA in 2010 which was felt not to be related to carotid disease. He specifically denies any new episodes of amaurosis fugax, transient ischemic attack or stroke. He remained stable from a cardiac standpoint. He is status post coronary bypass grafting in 2005.  Past Medical History  Diagnosis Date  . Hypertension   . Coronary artery disease   . Cerebrovascular disease   . Renal insufficiency   . Hypercholesterolemia   . Diabetes mellitus   . Overweight(278.02)   . Personal history of colonic polyps 10/08/2006    tubular adenomas  . Shoulder pain   . Vitamin D deficiency   . Gynecomastia   . Transient ischemic attack   . Keloid   . Neurodermatitis   . Carotid artery occlusion     Past Surgical History  Procedure Laterality Date  . Pilonidial cystectomy  1989  . Keloid surgery on chest scar  04/2008    Dr. Dessie Coma  . Coronary artery bypass graft  Feb. 2005    4 vessel    History   Social History  . Marital Status: Married    Spouse Name: [name]  . Number of Children: 1  . Years of Education: N/A   Occupational History  . retired    Social History Main Topics  . Smoking status: Never Smoker   . Smokeless tobacco: Former Systems developer    Types: Chew    Quit date: 07/11/2014     Comment: uses dip 3-4 times per week  . Alcohol Use: No  . Drug Use: No  . Sexual Activity: Not on file    Other Topics Concern  . Not on file   Social History Narrative    Family History  Problem Relation Age of Onset  . Colon cancer Neg Hx   . Lung cancer Paternal Uncle     questionable as to if it was lung ca  . Heart disease Mother     Before age 29  . Diabetes Mother   . Kidney disease Mother   . Heart attack Mother   . Cancer Father     Lung  . Diabetes Brother   . Heart disease Brother   . Diabetes Sister     Allergies as of 08/09/2015 - Review Complete 08/09/2015  Allergen Reaction Noted  . Niacin Other (See Comments)     Current Outpatient Prescriptions on File Prior to Visit  Medication Sig Dispense Refill  . aspirin 81 MG tablet Take 81 mg by mouth daily.      [name] atorvastatin (LIPITOR) 20 MG tablet Take 0.5 tablets (10 mg total) by mouth daily. Take as directed 30 tablet 11  . Cholecalciferol (VITAMIN D) 2000 UNITS CAPS Take 1 capsule by mouth daily.     . clopidogrel (PLAVIX) 75 MG tablet Take 1 tablet (75 mg total) by mouth daily. 90 tablet 0  . hydrochlorothiazide (MICROZIDE) 12.5  MG capsule TAKE 1 CAPSULE (12.5 MG TOTAL) BY MOUTH DAILY. 30 capsule 1  . nitroGLYCERIN (NITROSTAT) 0.4 MG SL tablet Place 1 tablet (0.4 mg total) under the tongue every 5 (five) minutes as needed for chest pain. 25 tablet 3  . valsartan (DIOVAN) 160 MG tablet TAKE 1 TABLET EVERY DAY 90 tablet 1   No current facility-administered medications on file prior to visit.         PHYSICAL EXAMINATION:  General: The patient is a well-nourished male, in no acute distress. Vital signs are BP 115/71 mmHg  Pulse 76  Ht 6\' 2"  (1.88 m)  Wt 226 lb (102.513 kg)  BMI 29.00 kg/m2  SpO2 96% Pulmonary: There is a good air exchange bilaterally without wheezing or rales. Musculoskeletal: There are no major deformities.  There is no significant extremity pain. Neurologic: No focal weakness or paresthesias are detected, Skin: There are no ulcer or rashes noted. Psychiatric: The patient has  normal affect. Cardiovascular:  Palpable l right ulnar pulse with radial harvest incision. 2+  Left radial pulse   [facility] Vascular Lab Studies:   duplex from 08/02/2015 was reviewed with the patient and his wife. This shows a critical stenosis in his right internal carotid artery. The artery is normal above the bifurcation there is a relatively low bifurcation. Moderate stenosis in the left internal carotid is unchanged from prior study  Impression and Plan:   critical stenosis right internal carotid artery. I very long discussion with the patient and his wife present. Explained the approximate 5% per year risk for neurologic deficit related to his asymptomatic stenosis. Explain 1 to 1.5% risk of stroke with surgery. Have recommended endarterectomy for reduction of stroke risk. He understands this is an expected one night hospitalization. He is scheduled to see Dr. Johnsie Cancel for preoperative cardiac clearance. We will proceed with right endarterectomy following this evaluation    Dimetri Armitage [facility] Office: [phone]

## 2015-08-26 NOTE — Addendum Note (Signed)
Addendum  created 08/26/15 1227 by Julian Reil, CRNA   Modules edited: Anesthesia Events, Narrator   Narrator:  Narrator: Event Log Edited

## 2015-08-27 LAB — CBC
HCT: 36.7 % — ABNORMAL LOW (ref 39.0–52.0)
Hemoglobin: 13.1 g/dL (ref 13.0–17.0)
MCH: 34.7 pg — ABNORMAL HIGH (ref 26.0–34.0)
MCHC: 35.7 g/dL (ref 30.0–36.0)
MCV: 97.3 fL (ref 78.0–100.0)
Platelets: 146 10*3/uL — ABNORMAL LOW (ref 150–400)
RBC: 3.77 MIL/uL — ABNORMAL LOW (ref 4.22–5.81)
RDW: 13.4 % (ref 11.5–15.5)
WBC: 8.1 10*3/uL (ref 4.0–10.5)

## 2015-08-27 LAB — BASIC METABOLIC PANEL
Anion gap: 5 (ref 5–15)
BUN: 13 mg/dL (ref 6–20)
CO2: 27 mmol/L (ref 22–32)
Calcium: 8.1 mg/dL — ABNORMAL LOW (ref 8.9–10.3)
Chloride: 104 mmol/L (ref 101–111)
Creatinine, Ser: 1.12 mg/dL (ref 0.61–1.24)
GFR calc Af Amer: >60 mL/min (ref >60)
GFR calc non Af Amer: >60 mL/min (ref >60)
Glucose, Bld: 117 mg/dL — ABNORMAL HIGH (ref 65–99)
Potassium: 3.7 mmol/L (ref 3.5–5.1)
Sodium: 136 mmol/L (ref 135–145)

## 2015-08-27 NOTE — Progress Notes (Addendum)
[facility]  Subjective  - Doing well over all.  He had some voiding issues over night and was in/out cthetered x 2.  He has since then been able to void independently.   Objective 131/56 67 98.2 F (36.8 C) (Oral) 32 94%  Intake/Output Summary (Last 24 hours) at 08/27/15 0754 Last data filed at 08/27/15 0649  Gross per 24 hour  Intake 3948.75 ml  Output   3050 ml  Net 898.75 ml    No tongue deviation, smile is symmetric Grip 5/5 equal bil. Sensation grossly intac and equal bil UE Incision C/D intact Speech is clear A;ert and oriented x 3  Assessment/Planning: POD #1 Right CEA  Ambulated, voided and taking PO's well Discharge home is stable condition   Laurence Slate [place] 08/27/2015 7:54 AM --  Laboratory Lab Results:  Recent Labs  08/26/15 1410 08/27/15 0339  WBC 9.5 8.1  HGB 14.8 13.1  HCT 41.6 36.7*  PLT 179 146*   BMET  Recent Labs  08/26/15 1410 08/27/15 0339  NA  --  136  K  --  3.7  CL  --  104  CO2  --  27  GLUCOSE  --  117*  BUN  --  13  CREATININE 1.15 1.12  CALCIUM  --  8.1*    COAG Lab Results  Component Value Date   INR 1.04 08/18/2015   INR 1.0 06/03/2009   No results found for: PTT    I have examined the patient, reviewed and agree with above.  Curt Jews, MD 08/27/2015 8:16 AM

## 2015-08-29 ENCOUNTER — Encounter (HOSPITAL_COMMUNITY): Payer: Self-pay | Admitting: Vascular Surgery

## 2015-08-29 ENCOUNTER — Telehealth: Payer: Self-pay | Admitting: Vascular Surgery

## 2015-08-29 NOTE — Telephone Encounter (Signed)
-----   Message from Mena Goes, RN sent at 08/28/2015 12:43 PM EDT ----- Regarding: Schedule   ----- Message -----    From: Ulyses Amor, PA-C    Sent: 08/27/2015   8:01 AM      To: [facility] Charge Pool  F/U with Dr. Donnetta Hutching in 2 weeks s/p CEA right

## 2015-08-29 NOTE — Telephone Encounter (Deleted)
-----   Message from Mena Goes, RN sent at 08/28/2015 12:43 PM EDT ----- Regarding: Schedule   ----- Message -----    From: Ulyses Amor, PA-C    Sent: 08/27/2015   8:01 AM      To: [facility] Charge Pool  F/U with Dr. Donnetta Hutching in 2 weeks s/p CEA right  notified patient of post op appt. on 09-20-15 at 2pm with dr. early

## 2015-08-30 NOTE — Discharge Summary (Signed)
[facility] Discharge Summary   Patient ID:  [name] [name] MRN: [id number] DOB/AGE: 1941/10/27 74 y.o.  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 Hypertension   . Cerebrovascular disease   . Renal insufficiency   . Hypercholesterolemia   . Overweight(278.02)   . Personal history of colonic polyps 10/08/2006    tubular adenomas  . Shoulder pain   . Vitamin D deficiency   . Gynecomastia   . Transient ischemic attack 2006    "lasted ~ 5 seconds"  . Keloid   . Neurodermatitis   . Carotid artery occlusion   . Coronary artery disease     s/p CABG 2005; sees Dr Johnsie Cancel yearly    Procedure(s): Right Carotid ENDARTERECTOMY with Patch Angioplasty   Discharged Condition: good  HPI: the patient is seen today for continued discussion regarding his severe asymptomatic right internal carotid artery stenosis. He's been followed in our office for a number of years with known bilateral moderate stenosis right greater than left. On recent six-month follow-up he was found to have progression to critical right carotid stenosis. He remains asymptomatic. He did have a TIA in 2010 which was felt not to be related to carotid disease. He specifically denies any new episodes of amaurosis fugax, transient ischemic attack or stroke. He remained stable from a cardiac standpoint. He is status post coronary bypass grafting in 2005.   Hospital Course:  [name] [name] is a 74 y.o. male is S/P  Procedure(s): Right Carotid ENDARTERECTOMY with Patch Angioplasty  POD# 1  Doing well over all. He had some voiding issues over night and was in/out  cathetered x 2. He has since then been able to void independently.  Ambulated, voided and taking PO's well Discharge home is stable condition     Significant Diagnostic Studies: CBC Lab Results  Component Value Date   WBC 8.1 08/27/2015   HGB 13.1 08/27/2015   HCT 36.7* 08/27/2015   MCV 97.3 08/27/2015   PLT 146* 08/27/2015    BMET    Component Value Date/Time   NA 136 08/27/2015 0339   K 3.7 08/27/2015 0339   CL 104 08/27/2015 0339   CO2 27 08/27/2015 0339   GLUCOSE 117* 08/27/2015 0339   BUN 13 08/27/2015 0339   CREATININE 1.12 08/27/2015 0339   CALCIUM 8.1* 08/27/2015 0339   GFRNONAA >60 08/27/2015 0339   GFRAA >60 08/27/2015 0339   COAG Lab Results  Component Value Date   INR 1.04 08/18/2015   INR 1.0 06/03/2009     Disposition:  Discharge to :Home Discharge Instructions    Call MD for:  redness, tenderness, or signs of infection (pain, swelling, bleeding, redness, odor or green/yellow discharge around incision site)    Complete by:  As directed      Call MD for:  severe or increased pain, loss or decreased feeling  in affected limb(s)    Complete by:  As directed      Call MD for:  temperature >100.5    Complete by:  As directed      Discharge instructions    Complete by:  As directed   You may shower in 24 hours     Driving Restrictions  Complete by:  As directed   No driving for 1-2 weeks     Increase activity slowly    Complete by:  As directed   Walk with assistance use walker or cane as needed     Lifting restrictions    Complete by:  As directed   No heavy lifting for 6 weeks     Resume previous diet    Complete by:  As directed             Medication List    TAKE these medications        aspirin 81 MG tablet  Take 81 mg by mouth daily.     atorvastatin 20 MG tablet  Commonly known as:  LIPITOR  Take 0.5 tablets (10 mg total) by mouth daily. Take as directed     clopidogrel 75 MG tablet  Commonly known as:  PLAVIX  Take 1  tablet (75 mg total) by mouth daily.     hydrochlorothiazide 12.5 MG capsule  Commonly known as:  MICROZIDE  TAKE 1 CAPSULE (12.5 MG TOTAL) BY MOUTH DAILY.     nitroGLYCERIN 0.4 MG SL tablet  Commonly known as:  NITROSTAT  Place 1 tablet (0.4 mg total) under the tongue every 5 (five) minutes as needed for chest pain.     oxyCODONE-acetaminophen 5-325 MG per tablet  Commonly known as:  PERCOCET/ROXICET  Take 1 tablet by mouth every 6 (six) hours as needed.     valsartan 160 MG tablet  Commonly known as:  DIOVAN  TAKE 1 TABLET EVERY DAY     Vitamin D 2000 UNITS Caps  Take 1 capsule by mouth daily.       Verbal and written Discharge instructions given to the patient. Wound care per Discharge AVS     Follow-up Information    Follow up with Curt Jews, MD In 2 weeks.   Specialties:  Vascular Surgery, Cardiology   Why:  sent message to office   Contact information:   [place] [address] [phone]       Signed: Laurence Slate [facility] 08/30/2015, 1:35 PM  --- For [facility] Registry use --- Instructions: Press F2 to tab through selections.  Delete question if not applicable.   Modified Rankin score at D/C (0-6): Rankin Score=0  IV medication needed for:  1. Hypertension: No 2. Hypotension: No  Post-op Complications: No  1. Post-op CVA or TIA: No  If yes: Event classification (right eye, left eye, right cortical, left cortical, verterobasilar, other):   If yes: Timing of event (intra-op, <6 hrs post-op, >=6 hrs post-op, unknown):   2. CN injury: No  If yes: CN  injuried   3. Myocardial infarction: No  If yes: Dx by (EKG or clinical, Troponin):   4.  CHF: No  5.  Dysrhythmia (new): No  6. Wound infection: No  7. Reperfusion symptoms: No  8. Return to OR: No  If yes: return to OR for (bleeding, neurologic, other CEA incision, other):   Discharge medications: Statin use:  Yes ASA use:  Yes Beta blocker use:  No  for medical reason    ACE-Inhibitor use:  No  for medical reason   P2Y12 Antagonist use: [ ]  None, [x ] Plavix, [ ]  Plasugrel, [ ]  Ticlopinine, [ ]  Ticagrelor, [ ]  Other, [ ]  No for medical reason, [ ]  Non-compliant, [ ]  Not-indicated Anti-coagulant use:  [ x] None, [ ]  Warfarin, [ ]  Rivaroxaban, [ ]  Dabigatran, [ ]   Other, [ ] No for medical reason, [ ] Non-compliant, [ ] Not-indicated

## 2015-08-31 ENCOUNTER — Ambulatory Visit: Payer: Medicare Other | Admitting: Physician Assistant

## 2015-08-31 NOTE — Telephone Encounter (Signed)
Erroneous encounter

## 2015-09-05 ENCOUNTER — Other Ambulatory Visit: Payer: Self-pay | Admitting: Cardiovascular Disease

## 2015-09-14 ENCOUNTER — Other Ambulatory Visit: Payer: Self-pay | Admitting: Pulmonary Disease

## 2015-09-19 ENCOUNTER — Encounter: Payer: Self-pay | Admitting: Vascular Surgery

## 2015-09-20 ENCOUNTER — Ambulatory Visit (INDEPENDENT_AMBULATORY_CARE_PROVIDER_SITE_OTHER): Payer: Medicare Other | Admitting: Vascular Surgery

## 2015-09-20 ENCOUNTER — Encounter: Payer: Self-pay | Admitting: Vascular Surgery

## 2015-09-20 VITALS — BP 107/50 | HR 71 | Temp 98.1°F | Resp 16 | Ht 74.0 in | Wt 228.0 lb

## 2015-09-20 DIAGNOSIS — I6523 Occlusion and stenosis of bilateral carotid arteries: Secondary | ICD-10-CM

## 2015-09-20 NOTE — Progress Notes (Signed)
Here today for follow-up of his right carotid endarterectomy for high-grade stenosis on 08/26/2015. He was asymptomatic from this narrowing. I did well discharged home on postoperative day 1. He did have some urinary retention and required in and out catheter 2. He has voided without difficulty since then. He has had a very mild discomfort report one pain medication the night after discharge.  He has had no neurologic deficits. His neck incision is healing quite nicely.  Impression and plan stable status post right carotid endarterectomy. He will resume full activities without limitation. We will see him again in 6 months with duplex.

## 2015-09-20 NOTE — Addendum Note (Signed)
Addended by: Dorthula Rue L on: 09/20/2015 05:00 PM   Modules accepted: Orders

## 2015-12-12 ENCOUNTER — Other Ambulatory Visit: Payer: Self-pay | Admitting: Cardiovascular Disease

## 2015-12-27 ENCOUNTER — Other Ambulatory Visit: Payer: Self-pay | Admitting: Pulmonary Disease

## 2016-01-30 ENCOUNTER — Ambulatory Visit (INDEPENDENT_AMBULATORY_CARE_PROVIDER_SITE_OTHER): Payer: Medicare Other | Admitting: Pulmonary Disease

## 2016-01-30 ENCOUNTER — Encounter: Payer: Self-pay | Admitting: Pulmonary Disease

## 2016-01-30 VITALS — BP 138/72 | HR 69 | Temp 97.5°F | Ht 73.0 in | Wt 225.0 lb

## 2016-01-30 DIAGNOSIS — I739 Peripheral vascular disease, unspecified: Secondary | ICD-10-CM

## 2016-01-30 DIAGNOSIS — I779 Disorder of arteries and arterioles, unspecified: Secondary | ICD-10-CM

## 2016-01-30 DIAGNOSIS — Z23 Encounter for immunization: Secondary | ICD-10-CM

## 2016-01-30 DIAGNOSIS — R7301 Impaired fasting glucose: Secondary | ICD-10-CM

## 2016-01-30 DIAGNOSIS — I2510 Atherosclerotic heart disease of native coronary artery without angina pectoris: Secondary | ICD-10-CM | POA: Diagnosis not present

## 2016-01-30 DIAGNOSIS — I10 Essential (primary) hypertension: Secondary | ICD-10-CM

## 2016-01-30 DIAGNOSIS — E663 Overweight: Secondary | ICD-10-CM

## 2016-01-30 DIAGNOSIS — Z9889 Other specified postprocedural states: Secondary | ICD-10-CM

## 2016-01-30 DIAGNOSIS — E785 Hyperlipidemia, unspecified: Secondary | ICD-10-CM

## 2016-01-30 NOTE — Patient Instructions (Signed)
Today we updated your med list in our EPIC system...    Continue your current medications the same...  Today we gave you the 2016 flu vaccine...  Let's get on track w/ our diet & exercise program...    The goal is to lose 20 lbs betw now & next summer...  Call for any questions...  Let's plan a follow up visit in 32mo, sooner if needed for problems.Marland KitchenMarland Kitchen

## 2016-01-31 ENCOUNTER — Encounter: Payer: Self-pay | Admitting: Pulmonary Disease

## 2016-01-31 NOTE — Progress Notes (Signed)
Subjective:    Patient ID: [name] [name], male    DOB: [birth year], 75 y.o.   MRN: [id number]  HPI 75 y/o WM here for a follow up visit... he has multiple medical problems as noted below...   SEE PREV EPIC NOTES FOR EARLIER DATA>>   LABS 6/14:  FLP- on Lip10 w/ TChol/LDL ok but TG/HDL still off;  Chems- ok x BS=136, A1c=6.4;  CBC- wnl;  TSH=2.66;  PSA=1.65;  CRP done at pt request=0.5....  CXR 7/15 showed cardiomeg, no change in contour of mediastinum compared to mult old films, clear lungs, NAD...   CDopplers 7/15 showed 60-79% RICAstenosis & [id number] LICAstenosis (no change)...   LABS 7/15:  FLP- TChol&LDL ok on Lip10 but TG incr & HDL too low;  Chems- ok w/ BS=115A1c=6.2;  CBC- wnl;  TSH=2.51;  PSA=1.78...   ~  January 28, 2015:  54mo ROV & [name] [name] reports a good interval- no new medical complaints or concerns; he has however been under a lot of stress regarding his 21y/o grand daughter (Hx obesity >300#, subseq gastric bypass in [place], now Dx w/ anorexia, & she was the victim of a sexual assault); he has also had 4 friends pass away over the last month, mult funerals etc;  We discussed these issues 7 offered anxiolytic but he declines 7 he seems o be handling it all as well as can be expected, he will call if he feels he needs Rx...     [name] on Diovan160 & HCT 12.5; Norvasc caused swelling; BP= 130/66 & feeling well w/o CP, palpit, dizzy, SOB, edema...    He saw [name] for Cards 8/15> known CAD & prev CABG 2005, normal LVF, on ASA/Plavix; stable on meds and no changes made...     He saw [facility] for f/u Carotid stenosis 1/16> he remains on ASA/Plavix & asymptomatic w/o signs of cerebral ischemia; f/u CDoppler 1/16 (no bruits) showed stable 60-79% right ICAstenosis & left <40% ICAstenosis (f/u Q50mo).    Chol> well controlled on Lip20; he is intol to Niacin... We reviewed prob list, meds, xrays and labs> see below for updates >> Given 2015 flu vaccine today...  ~  July 29, 2015:  35mo ROV &  [name] [name] reports doing satis "I'm handling stress better these days"- continued issues w/ 67 y/o grand-daughter... We reviewed the following medical problems during today's office visit >>     [name] on Diovan160 & HCT-12.5 (intol to BBlockers in past); BP= 142/64 & he says good at home too; denies CP, palpit, SOB, edema, etc...    CAD> s/p 4 vessel CABG 2005, on ASA/ Plavix; he saw DrNishan 8/15 for yearly check up- stable & no changes made...    Cerebrovasc Dis> on ASA/ Plavix; followed by [name] & seen 1/16 w/ f/u CDopplers stable w/ 60-79% right ICA stenosis, <40% left ICA stenosis, they are checking Q54mo...    CHOL> on Lip10- taking 1/2 of 20mg  tab daily; FLP 7/16 showed TChol 129, TG 182, HDL 31, LDL 62; he is intol Niacin & rec to incr exercise program & get wt down...    Overweight> weight= 226# which is up 2#; we reviewed diet, exercise, & wt loss program...    Hx colon polyps> f/u colonoscopy 2/13 by [name]- neg, no polyps & he rec f/u in 71yrs...    GU> he reports no benefit from Sildenafil & prev requested Urology appt to discuss additional alternatives but he never went...    DJD/ Vit D defic> he uses OTC  analgesics as needed; continues on Vit D 2000u daily w/ last VitD level 6/13= 34...    Hx TIA> SEE 6/10 Hosp> sm vessel dis, remote lacunar infarcts, intracranial atherosclerotic changes on MRA; CDopplers followed by [name] as above w/ 60-79% RICAstenosis; on ASA/Plavix...    Keloid> in lower sternal scar w/ plastic repair in past; he saw Derm 2016 for removal of SK, epidermoid cyst, lipoma EXAM reveals Afeb, VSS, O2sat=97% on RA;  HEENT- neg x wax;  Vasc- bilat C bruits R>L;  Chest- clear w/o w/r/r;  Heart- RR gr1/6SEM w/o r/g;  Abd- obese, soft, neg;  Ext- +VV/VI, w/o c/c/e... We reviewed prob list, meds, xrays and labs> see below for updates >>   LABS 7/16:  FLP- at goals on Cres5;  Chems- wnl x BS=111, A1c=6.1;  CBC- wnl;  TSH=3.01;  PSA=2.14...  ~  January 30, 2016:  62mo [place] had right CAE w/ DPA 08/26/15 by Sharlot Gowda for a progressive stenosis & did well;  He had cardiac clearance due to his hx CAD w/ prev CABG in 2005 & norm LVF; Myoview 08/18/15 showed c/w prior MI w/ peri-infarct ischemia, EF=55-65%, felt to be a low risk study;  EKG showed NSR, rate76/min, wnl, NAD;  He remains on ASA/ Plavix;  BP controlled on Diovan160, HCT12.5, w/ BP=138/72 today & he denies HAs, CP, palpit, SOB, edema;  He is reminded about diet/ exercise/ wt reduction...     He developed some urinary retention after carotid surg 08/2015- required in & out cath x2 & no prob since...     He has DJD & c/o some left hip pain; takes OTC analgesics as needed & he will check into Ortho (DrGraves) eval soon... EXAM reveals Afeb, VSS, O2sat=97% on RA;  HEENT- neg, mallampati2; Vasc- leftt C bruit & s/p R-CAE;  Chest- clear w/o w/r/r;  Heart- RR gr1/6SEM w/o r/g;  Abd- obese, soft, diastasis;  Ext- +VV/VI, w/o c/c/ +tr edema... IMP/PLAN>>  [name] [name] is stable, continue same meds, needs incr exerc & wt reduction; OK Flu shot today...           Problem List:    ENT >>  He sawDrWolicki [id number] for cerumen, hearing loss, presbycusis- candidate for hearing aides but he is holding off...  HYPERTENSION (ICD-401.9) >> ~  6/09:  prev on Atenolol25mg - stopped due to side effects & feels much better off BBlockers. ~  2/10:  Avapro300 changed to Diovan160 per his insurance company for $$$ reasons. ~  11/11:  BP= 150/80 & remains asymptomatic, doesn't want to incr meds- discussed diet/ exerc... ~  5/12:  BP= 132/70 & denies HA, fatigue, visual changes, CP, palipit, dizziness, syncope, dyspnea, edema, etc... ~  1/13:  BP= 152/68 & he remains asymptomatic; we decided to add NORVASC 5mg /d ==> pt stopped on his own due to swelling. ~  6/13:  He saw DrNishan for f/u & BP= 154/80 on Diovan alone; added HCTZ 12.5mg /d... ~  7/13:  BP= 148/82 here & 130s/70s at home he says on DIOVAN160mg /d & HCTZ 12.5mg /d; continue same ~   CXR 7/13 showed cardiomeg, s/p CABG, bronchitic changes at lung bases, NAD... ~  1/14: on Diovan160 & HCT-12.5 (intol to BBlockers in past); BP= 142/80 & he says better at home; denies CP, palpit, SOB, edema, etc...  ~  7/14: on Diovan160 & HCT-12.5 (intol to BBlockers in past); BP= 140/70 & he remains asymptomatic... ~  1/15: on Diovan160 & HCT-12.5; BP= 130/60 & he says good at home  too; denies CP, palpit, SOB, edema, etc ~  CXR 7/15 showed cardiomeg, no change in contour of mediastinum compared to mult old films, clear lungs, NAD ~  1/16:  on Diovan160 & HCT 12.5; Norvasc caused swelling; BP= 130/66 & feeling well w/o CP, palpit, dizzy, SOB, edema. ~  7/16: on Diovan160 & HCT-12.5 (intol to BBlockers in past); BP= 142/64 & he says even better at home; he remains asymptomatic.  CORONARY ARTERY DISEASE (ICD-414.00) - S/P 4 vessel CABG 2/05 by DrVanTrigt> on ASA/ PLAVIX & followed by [name] yearly & his notes are reviewed; he is intol to BBlocker therapy as noted... ~  1/13 & 7/13:  stable & denies angina, palpit, SOB, edema, etc; EKG showed NSR, rate71, WNL, NAD.[name].  ~  8/14: he had yearly f/u DrNishan> CAD, s/pCABG in 2005, norm LVF; EKG showed NSR, rate77, NSSTTWA, NAD; stable, rec to incr exercise & lose wt; no change in meds...  ~  8/15: He saw [name] for yearly Cards visit> known CAD & prev CABG 2005, normal LVF, on ASA/Plavix; stable on meds and no changes made  CEREBROVASCULAR DISEASE (ICD-437.9) - see above> on ASA 81mg /d, & PLAVIX 75mg /d... followed by Sharlot Gowda for [facility]. ~  6/10:  See 6/10 Hosp records & f/u by [name] in his office... ~  6/11:  seen by [name] & stable w/o TIAs or amaurosis; CDopplers were stable w/ bilat 40-59% ICA stenoses, f/u 56yr. ~  He was due for a follow up eval 6/12> we don't have records & will call for the report... ~  6/13:  He saw [name] for f/u asymptomatic carotid dis> CDoppler 6/13 showed some progression of right sided stenosis (now 60-79%) but denies  cerebral ischemic symptoms (right side remains 40-59%); continue ASA/Plavix & f/u planned 58mo. ~  1/14:  f/u CDopplers were stable w/ 60-79% right ICA stenosis & 20-39% left ICA stenosis & f/u planned in 55mo... ~  7/14:  f/u CDopplers at [facility] showed 60-79% right ICAstenosis and 0-40% left ICAstenosis- f/u Q65mo... ~  1/15:  f/u w/ [facility] for known Carotid stenosis> CDuplex showed 60-79% right carotid stenosis and 40-59% left carotid stenosis; continue ASA/Plavix and lifestyle mod strategies... ~  7/15:  f/u w/ [facility] for known Carotid stenosis> CDuplex unchanges w/ 60-79% RICAstenosis... ~  1/16: He saw [facility] for f/u Carotid stenosis> he remains on ASA/Plavix & asymptomatic w/o signs of cerebral ischemia; f/u CDoppler 1/16 (no bruits) showed stable 60-79% right ICAstenosis & left <40% ICAstenosis (f/u Q65mo).  VENOUS INSUFFICIENCY (ICD-459.81) - he is aware of need to elim salt, elevate legs, wear support hose.  HYPERCHOLESTEROLEMIA (ICD-272.0) - controlled on LIPITOR 20- 1/2 tab daily... he has tried NIACIN in the past and refuses to take it again due to headaches. ~  FLP 5/08 showed TChol 125, TG 171, HDL 24, LDL 67...  ~  [name] [name] 12/08 showed TChol 107, TG 131, HDL 23, LDL 58... rec- same med, better diet & exercise. ~  FLP 2/10 on Lip10 (wt=235#) showed TChol 119, TG 158, HDL 24, LDL 63... rec- ditto ~  [name] [name] 11/10 on Lip10 (wt=236#) showed TChol 115, TG 128, HDL 28, LDL 62 ~  FLP 5/11 on Lip10 (wt=233#) showed TChol 119, TG 144, HDL 27, LDL 63 ~  DrNishan suggested that he cut the Lipitor to 5mg /d & add Niaspan, but he refuses the Niacin preps due to HAs & wants to contin Lip10. ~  FLP 11/11 on Lip10 (wt=235#) showed TChol 119, TG 113, HDL 31, LDL 66 ~  FLP 5/12 on Lip10 (wt=229#) showed TChol 118, TG 150, HDL 28, LDL 60 ~  FLP 6/13 on Lip10 (wt=232#) showed TChol 127, TG 164, HDL 27, LDL 67... He tells me [name] wanted him in an HDL trial... ~  6/14: on Lip20- taking 1/2 tab daily; FLP 6/14 shows TChol  126, TG 155, HDL 32, LDL 63; he is intol Niacin & rec to incr exercise program & get wt down ~  FLP 7/15 on Lip10 showed TChol 118, TG 166, HDL 29, LDL 56... ~  FLP 7/16 on Lip10 showed TChol 129, TG 182, HDL 31, LDL 62  DIABETES MELLITUS, BORDERLINE (ICD-790.29) - On diet Rx alone... ~  labs in hosp 6/10 showed BS= 109-113 ~  labs 11/10 showed BS= 109, A1c= 5.9 ~  labs 5/11 showed BS= 107 ~  labs 11/11 showed BS= 108 ~  Labs 5/12 showed BS= 97, A1c= 6.0 ~  Labs 6/13 showed BS=122, A1c= 6.4... Needs better diet & wt reduction. ~  Labs 6/14 showed BS= 136, A1c= 6.4 ~  Labs 7/15 on diet alone showed BS=115, A1c= 6.2 ~  Labs 7/16 on diet alone showed BS= 111, A1c= 6.1  OVERWEIGHT (ICD-278.02) - he is 6'2" tall and weighs ~230# for a BMI of 30... we have discussed diet & exercise stategies. ~  7/15: he has lost 6# down to 224# today... ~  7/16: he has gained 2# to 226#  COLONIC POLYPS (ICD-211.3) - last colonoscopy 10/07 by [name] showed several 5-6 mm polyps... path= tubular adenomas ... f/u planned 5 years. ~  Colonoscopy 2/13 by [name] was neg- no recurrent polyps etc & he felt that 56yr f/u was appropriate...  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 Rx w/ PT.  VITAMIN D DEFIC >> on Vit D supplement 2000u daily... ~  Labs 2/10 showed Vit D level = 14... rec to start Vit D supplement OTC... ~  Labs 6/13 showed Vit D level = 34... rec to continue his VitD supplement daily...  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 ~  6/10:  Hosp w/ TIA w/ left face & arm symptoms that resolved quickly; CT showed small vessel disease;  MRI showed bilat remote lacunar infarcts, no acute infarct, +sm vessel dis;  MRA  showed intracranial atherosclerotic changes, & ?focal stenosis (?70%) of left ICA in the neck; he saw [name] 06/08/09 (no bruits) w/ CDopplers done showing bilat 40-59% carotid stenoses & agreed w/ ASA + Plavix and f/u 11yr...  ~  6/13: saw [name] for f/u asymptomatic carotid dis> CDoppler 6/13 showed some progression of right sided stenosis (now 60-79%) but denies cerebral ischemic symptoms (right side remains 40-59%); continue ASA/Plavix & f/u planned 41mo. ~  1/14: he continues Q77mo f/u w/ [facility]- stable w/o cerebral ischemic symptoms => CDopplers are stable (see above).  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 he had plastic surg by [facility] 5/09 w/ improved scar. ~  He has seb cyst over CSpine & he tells me that DrTafeen is going to remove this soon... ~  He has a small knot/lesion ?cyst over the fibular head on lat side of left lower leg; stable, no discomfort, no change & offered Ortho eval but he prefers to just watch it for  now... ~  2016> he saw Derm for removal of  SK, epidermoid cyst, lipoma...  HEALTH MAINTENANCE: ~  GI:  followed by [name] & last colonoscopy was 10/07 w/ f/u planned 18yrs. ~  GU:  he is up to date on DRE & PSA checks here (PSA remains in the 1-2 range). ~  Immunizations:  he had [place] in 2000 w/ repeat PNEUMOVAX 11/10 & TDAP 5/11... he gets the yearly Seasonal Flu vaccine each fall...   Past Surgical History  Procedure Laterality Date  . Pilonidal cyst excision  1989  . Keloid excision  04/2008    on chest scar; Dr. Dessie Coma  . Coronary artery bypass graft  Feb. 2005    4 vessel  . Carotid endarterectomy Right 08/26/2015  . Keloid excision    . Cardiac catheterization  02/2004    "tried to stent; couldn't"  . Coronary angioplasty    . Endarterectomy Right 08/26/2015    Procedure: Right Carotid ENDARTERECTOMY with Patch Angioplasty ;  Surgeon: Rosetta Posner, MD;  Location: [facility];  Service: Vascular;  Laterality: Right;    Outpatient  Encounter Prescriptions as of 01/30/2016  Medication Sig  . aspirin 81 MG tablet Take 81 mg by mouth daily.    [name] atorvastatin (LIPITOR) 20 MG tablet Take 0.5 tablets (10 mg total) by mouth daily. Take as directed  . Cholecalciferol (VITAMIN D) 2000 UNITS CAPS Take 1 capsule by mouth daily.   . clopidogrel (PLAVIX) 75 MG tablet TAKE 1 TABLET BY MOUTH EVERY DAY  . hydrochlorothiazide (MICROZIDE) 12.5 MG capsule TAKE 1 CAPSULE (12.5 MG TOTAL) BY MOUTH DAILY.  . nitroGLYCERIN (NITROSTAT) 0.4 MG SL tablet Place 1 tablet (0.4 mg total) under the tongue every 5 (five) minutes as needed for chest pain.  . valsartan (DIOVAN) 160 MG tablet Take 1 tablet (160 mg total) by mouth daily.  . [DISCONTINUED] atorvastatin (LIPITOR) 20 MG tablet TAKE 1 TABLET BY MOUTH EVERY DAY  . [DISCONTINUED] oxyCODONE-acetaminophen (PERCOCET/ROXICET) 5-325 MG per tablet Take 1 tablet by mouth every 6 (six) hours as needed. (Patient not taking: Reported on 09/20/2015)   No facility-administered encounter medications on file as of 01/30/2016.    Allergies  Allergen Reactions  . Niacin Other (See Comments)    REACTION: intol to NIACIN w/ headaches    Current Medications, Allergies, Past Medical History, Past Surgical History, Family History, and Social History were reviewed in [employer] record.    Review of Systems         See HPI - all other systems neg except as noted... The patient denies anorexia, fever, weight loss, weight gain, vision loss, decreased hearing, hoarseness, chest pain, syncope, dyspnea on exertion, peripheral edema, prolonged cough, headaches, hemoptysis, abdominal pain, melena, hematochezia, severe indigestion/heartburn, hematuria, incontinence, muscle weakness, suspicious skin lesions, transient blindness, difficulty walking, depression, unusual weight change, abnormal bleeding, enlarged lymph nodes, and angioedema.     Objective:   Physical Exam    WD, Overweight, 75 y/o WM  in NAD... Vital Signs:  Reviewed> GENERAL:  Alert & oriented; pleasant & cooperative. HEENT:  [place]/AT, EOM-wnl, PERRLA, Fundi-benign, EACs-clear, TMs-wnl, NOSE-clear, THROAT-clear & wnl. NECK:  Supple w/ fairROM; no JVD; normal carotid impulses w/o bruits; no thyromegaly or nodules palpated; no lymphadenopathy. CHEST:  Clear to P & A; without wheezes/ rales/ or rhonchi. HEART:  Median sternotomy scar w/ improved keloid, regular rhythm; without murmurs/ rubs/ or gallops. ABDOMEN:  Soft & nontender; normal bowel sounds; no organomegaly or  masses detected. 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 EXT: without deformities, mild arthritic changes; + venous insuffic changes, no edema. NEURO:  CN's intact; motor testing normal; sensory testing normal; gait normal & balance OK. DERM:  Keloid as noted, no other lesions...  RADIOLOGY DATA:  Reviewed in the EPIC EMR & discussed w/ the patient...  LABORATORY DATA:  Reviewed in the EPIC EMR & discussed w/ the patient...   Assessment & Plan:    [name]  On Diovan160 & HCT 12.5 w/ improved/stable BP; needs to lose wt & elim sodium...  CAD, s/p CABG 2005> followed by [name] for Cards, stable & doing satis...  CEREBROVASC DIS/ TIA>  No cerebral ischemic symptoms on the ASA/ Plavix; he had right CEA 8/16 by [name]...  CHOL>  On Lip10 & stable; needs better low fat diet & get wt down...  Borderline DM/ IFG> adeq control w/ diet alone but he understands the importance of wt reduction.  GI>  He had colonoscopy f/u 2/13 & it was neg...  Other medical problems as noted...   Patient's Medications  New Prescriptions   No medications on file  Previous Medications   ASPIRIN 81 MG TABLET    Take 81 mg by mouth daily.     ATORVASTATIN (LIPITOR) 20 MG TABLET    Take 0.5 tablets (10 mg total) by mouth daily. Take as directed   CHOLECALCIFEROL (VITAMIN D) 2000 UNITS CAPS    Take 1 capsule by mouth daily.    CLOPIDOGREL (PLAVIX) 75 MG TABLET    TAKE  1 TABLET BY MOUTH EVERY DAY   HYDROCHLOROTHIAZIDE (MICROZIDE) 12.5 MG CAPSULE    TAKE 1 CAPSULE (12.5 MG TOTAL) BY MOUTH DAILY.   NITROGLYCERIN (NITROSTAT) 0.4 MG SL TABLET    Place 1 tablet (0.4 mg total) under the tongue every 5 (five) minutes as needed for chest pain.   VALSARTAN (DIOVAN) 160 MG TABLET    Take 1 tablet (160 mg total) by mouth daily.  Modified Medications   No medications on file  Discontinued Medications   ATORVASTATIN (LIPITOR) 20 MG TABLET    TAKE 1 TABLET BY MOUTH EVERY DAY   OXYCODONE-ACETAMINOPHEN (PERCOCET/ROXICET) 5-325 MG PER TABLET    Take 1 tablet by mouth every 6 (six) hours as needed.

## 2016-02-16 NOTE — Progress Notes (Signed)
Patient ID: [name] [name], male   DOB: Dec 01, 1941, 75 y.o.   MRN: [id number]     Cardiology Office Note   Date:  02/22/2016   ID:  [name] [name], DOB 1941/04/24, MRN [id number]  PCP:  Noralee Space, MD  Cardiologist:  Dr. Johnsie Cancel    Chief Complaint  Patient presents with  . Coronary Artery Disease    no sx      History of Present Illness: [name] [name] is a 75 y.o. male  known coronary disease with previous coronary bypass surgery in 2005. he has normal LV function. August 2016 Had right CEA with Dr Early 08/26/15    Nuclear 08/18/15     Nuclear stress EF: 56%.  There was no ST segment deviation noted during stress.  Defect 1: There is a medium defect of moderate severity present in the basal inferior, basal inferolateral, mid inferior and mid inferolateral location. May be related to diaphragmatic attenuation, but cannot rule out infarct with peri-infarct ischemia.  This is a low risk study.  Findings consistent with prior myocardial infarction with peri-infarct ischemia.  The left ventricular ejection fraction is normal (55-65%).   Grandson plays baseball for [employer] No angina.  Neck healed well  Past Medical History  Diagnosis Date  . Hypertension   . Cerebrovascular disease   . Renal insufficiency   . Hypercholesterolemia   . Overweight(278.02)   . Personal history of colonic polyps 10/08/2006    tubular adenomas  . Shoulder pain   . Vitamin D deficiency   . Gynecomastia   . Transient ischemic attack 2006    "lasted ~ 5 seconds"  . Keloid   . Neurodermatitis   . Carotid artery occlusion   . Coronary artery disease     s/p CABG 2005; sees Dr Johnsie Cancel yearly    Past Surgical History  Procedure Laterality Date  . Pilonidal cyst excision  1989  . Keloid excision  04/2008    on chest scar; Dr. Dessie Coma  . Coronary artery bypass graft  Feb. 2005    4 vessel  . Carotid endarterectomy Right 08/26/2015  . Keloid excision    . Cardiac  catheterization  02/2004    "tried to stent; couldn't"  . Coronary angioplasty    . Endarterectomy Right 08/26/2015    Procedure: Right Carotid ENDARTERECTOMY with Patch Angioplasty ;  Surgeon: Rosetta Posner, MD;  Location: [facility] OR;  Service: Vascular;  Laterality: Right;     Current Outpatient Prescriptions  Medication Sig Dispense Refill  . aspirin 81 MG tablet Take 81 mg by mouth daily.      [name] atorvastatin (LIPITOR) 20 MG tablet Take 0.5 tablets (10 mg total) by mouth daily. Take as directed 30 tablet 11  . Cholecalciferol (VITAMIN D) 2000 UNITS CAPS Take 1 capsule by mouth daily.     . clopidogrel (PLAVIX) 75 MG tablet TAKE 1 TABLET BY MOUTH EVERY DAY 90 tablet 2  . hydrochlorothiazide (MICROZIDE) 12.5 MG capsule TAKE 1 CAPSULE (12.5 MG TOTAL) BY MOUTH DAILY. 30 capsule 11  . nitroGLYCERIN (NITROSTAT) 0.4 MG SL tablet Place 1 tablet (0.4 mg total) under the tongue every 5 (five) minutes as needed for chest pain. 25 tablet 3  . valsartan (DIOVAN) 160 MG tablet Take 1 tablet (160 mg total) by mouth daily. 90 tablet 1   No current facility-administered medications for this visit.    Allergies:   Niacin    Social History:  The patient  reports that he  has never smoked. His smokeless tobacco use includes Snuff. He reports that he does not drink alcohol or use illicit drugs.   Family History:  The patient's family history includes Cancer in his father; Diabetes in his brother, mother, and sister; Heart attack in his mother; Heart disease in his brother and mother; Kidney disease in his mother; Lung cancer in his paternal uncle. There is no history of Colon cancer.    ROS:  General:no colds or fevers, no weight changes Skin:no rashes or ulcers HEENT:no blurred vision, no congestion CV:see HPI PUL:see HPI GI:no diarrhea constipation or melena, no indigestion GU:no hematuria, no dysuria MS:no joint pain, no claudication Neuro:no syncope, no lightheadedness Endo:no diabetes, no thyroid  disease  Wt Readings from Last 3 Encounters:  02/22/16 103.42 kg (228 lb)  01/30/16 102.059 kg (225 lb)  09/20/15 103.42 kg (228 lb)     PHYSICAL EXAM: VS:  BP 142/50 mmHg  Pulse 67  Ht 6\' 2"  (1.88 m)  Wt 103.42 kg (228 lb)  BMI 29.26 kg/m2  SpO2 98% , BMI Body mass index is 29.26 kg/(m^2). General:Pleasant affect, NAD Skin:Warm and dry, brisk capillary refill HEENT:normocephalic, sclera clear, mucus membranes moist Neck:supple, no JVD, post R CEA  Heart:S1S2 RRR without murmur, gallup, rub or click Lungs:clear without rales, rhonchi, or wheezes [id number], non tender, + BS, do not palpate liver spleen or masses Ext:no lower ext edema, 2+ pedal pulses, 2+ radial pulses Neuro:alert and oriented X 3, MAE, follows commands, + facial symmetry    EKG:   08/19/15   SR no acute changes from 2015.   Recent Labs: 07/25/2015: TSH 3.01 08/18/2015: ALT 27 08/27/2015: BUN 13; Creatinine, Ser 1.12; Hemoglobin 13.1; Platelets 146*; Potassium 3.7; Sodium 136    Lipid Panel    Component Value Date/Time   CHOL 129 07/25/2015 0733   TRIG 182.0* 07/25/2015 0733   HDL 30.80* 07/25/2015 0733   CHOLHDL 4 07/25/2015 0733   VLDL 36.4 07/25/2015 0733   LDLCALC 62 07/25/2015 0733       Other studies Reviewed: Additional studies/ records that were reviewed today include: previous notes, nuc study.   ASSESSMENT AND PLAN:  1.  CAD s/p CABG 2005, no angina, neg nuc study for ischemia August 2016 stable continue medical Rx  2. Carotid:  Post right CEA with some residual left disease f/u Dr Early  Due for post op duplex March 17  3.  HTN  Controlled  4. Hx TIA on ASA.   5. Hyperlipidemia stable on statin. Last LDL <70   [name]

## 2016-02-22 ENCOUNTER — Ambulatory Visit (INDEPENDENT_AMBULATORY_CARE_PROVIDER_SITE_OTHER): Payer: Medicare Other | Admitting: Cardiovascular Disease

## 2016-02-22 ENCOUNTER — Encounter: Payer: Self-pay | Admitting: Cardiovascular Disease

## 2016-02-22 VITALS — BP 142/50 | HR 67 | Ht 74.0 in | Wt 228.0 lb

## 2016-02-22 DIAGNOSIS — I2510 Atherosclerotic heart disease of native coronary artery without angina pectoris: Secondary | ICD-10-CM

## 2016-02-22 MED ORDER — NITROGLYCERIN 0.4 MG SL SUBL: 0.4000 mg | SUBLINGUAL_TABLET | SUBLINGUAL | Status: DC | PRN

## 2016-02-22 MED ORDER — HYDROCHLOROTHIAZIDE 12.5 MG PO CAPS: ORAL_CAPSULE | ORAL | Status: DC

## 2016-02-22 NOTE — Patient Instructions (Addendum)

## 2016-03-20 ENCOUNTER — Ambulatory Visit (HOSPITAL_COMMUNITY)
Admission: RE | Admit: 2016-03-20 | Discharge: 2016-03-20 | Disposition: A | Discharge status: Home / Self Care | Payer: Medicare Other | Source: Ambulatory Visit | Attending: Vascular Surgery | Admitting: Vascular Surgery

## 2016-03-20 ENCOUNTER — Ambulatory Visit: Payer: Medicare Other | Admitting: Vascular Surgery

## 2016-03-20 DIAGNOSIS — I2510 Atherosclerotic heart disease of native coronary artery without angina pectoris: Secondary | ICD-10-CM | POA: Diagnosis not present

## 2016-03-20 DIAGNOSIS — E7800 Pure hypercholesterolemia, unspecified: Secondary | ICD-10-CM | POA: Insufficient documentation

## 2016-03-20 DIAGNOSIS — I10 Essential (primary) hypertension: Secondary | ICD-10-CM | POA: Diagnosis not present

## 2016-03-20 DIAGNOSIS — I6523 Occlusion and stenosis of bilateral carotid arteries: Secondary | ICD-10-CM | POA: Insufficient documentation

## 2016-03-21 ENCOUNTER — Encounter: Payer: Self-pay | Admitting: Vascular Surgery

## 2016-03-27 ENCOUNTER — Other Ambulatory Visit: Payer: Self-pay | Admitting: *Deleted

## 2016-03-27 ENCOUNTER — Encounter: Payer: Self-pay | Admitting: Vascular Surgery

## 2016-03-27 ENCOUNTER — Ambulatory Visit (INDEPENDENT_AMBULATORY_CARE_PROVIDER_SITE_OTHER): Payer: Medicare Other | Admitting: Vascular Surgery

## 2016-03-27 VITALS — BP 126/75 | HR 75 | Temp 98.2°F | Resp 16 | Ht 73.0 in | Wt 227.0 lb

## 2016-03-27 DIAGNOSIS — I6523 Occlusion and stenosis of bilateral carotid arteries: Secondary | ICD-10-CM

## 2016-03-27 NOTE — Progress Notes (Signed)
[facility] [place]  Patient name: [name] [name] MRN: [id number] DOB: Jun 30, 1941 Sex: male  REASON FOR VISIT: Follow-up of carotid disease  HPI: [name] [name] is a 75 y.o. male followed for a long period of time with the aggressive carotid stenosis. He had a TIA proximally 6 years with no critical stenosis at that time. He did develop severe asymptomatic disease and underwent uneventful right carotid endarterectomy in August 2016. He is here today for ultrasound follow-up. He reports no new medical difficulties. Specifically denies any neurologic deficits.  Past Medical History  Diagnosis Date  . Hypertension   . Cerebrovascular disease   . Renal insufficiency   . Hypercholesterolemia   . Overweight(278.02)   . Personal history of colonic polyps 10/08/2006    tubular adenomas  . Shoulder pain   . Vitamin D deficiency   . Gynecomastia   . Transient ischemic attack 2006    "lasted ~ 5 seconds"  . Keloid   . Neurodermatitis   . Carotid artery occlusion   . Coronary artery disease     s/p CABG 2005; sees Dr Johnsie Cancel yearly    Family History  Problem Relation Age of Onset  . Colon cancer Neg Hx   . Lung cancer Paternal Uncle     questionable as to if it was lung ca  . Heart disease Mother     Before age 4  . Diabetes Mother   . Kidney disease Mother   . Heart attack Mother   . Cancer Father     Lung  . Diabetes Brother   . Heart disease Brother   . Diabetes Sister     SOCIAL HISTORY: Social History  Substance Use Topics  . Smoking status: Former Research scientist (life sciences)  . Smokeless tobacco: Current User    Types: Snuff     Comment: uses dip 3-4 times per week  . Alcohol Use: No    Allergies  Allergen Reactions  . Niacin Other (See Comments)    REACTION: intol to NIACIN w/ headaches    Current Outpatient Prescriptions  Medication Sig Dispense Refill  . aspirin 81 MG tablet Take 81 mg by mouth daily.      [name] atorvastatin (LIPITOR) 20 MG tablet  Take 0.5 tablets (10 mg total) by mouth daily. Take as directed 30 tablet 11  . Cholecalciferol (VITAMIN D) 2000 UNITS CAPS Take 1 capsule by mouth daily.     . clopidogrel (PLAVIX) 75 MG tablet TAKE 1 TABLET BY MOUTH EVERY DAY 90 tablet 2  . hydrochlorothiazide (MICROZIDE) 12.5 MG capsule TAKE 1 CAPSULE (12.5 MG TOTAL) BY MOUTH DAILY. 30 capsule 11  . nitroGLYCERIN (NITROSTAT) 0.4 MG SL tablet Place 1 tablet (0.4 mg total) under the tongue every 5 (five) minutes as needed for chest pain. 25 tablet 3  . valsartan (DIOVAN) 160 MG tablet Take 1 tablet (160 mg total) by mouth daily. 90 tablet 1   No current facility-administered medications for this visit.    REVIEW OF SYSTEMS:  [X]  denotes positive finding, [ ]  denotes negative finding Cardiac  Comments:  Chest pain or chest pressure:    Shortness of breath upon exertion:    Short of breath when lying flat:    Irregular heart rhythm:        Vascular    Pain in calf, thigh, or hip brought on by ambulation:    Pain in feet at night that wakes you up from your sleep:  Blood clot in your veins:    Leg swelling:         Pulmonary    Oxygen at home:    Productive cough:     Wheezing:         Neurologic    Sudden weakness in arms or legs:     Sudden numbness in arms or legs:     Sudden onset of difficulty speaking or slurred speech:    Temporary loss of vision in one eye:     Problems with dizziness:         Gastrointestinal    Blood in stool:     Vomited blood:         Genitourinary    Burning when urinating:     Blood in urine:        Psychiatric    Major depression:         Hematologic    Bleeding problems:    Problems with blood clotting too easily:        Skin    Rashes or ulcers:        Constitutional    Fever or chills:      PHYSICAL EXAM: Filed Vitals:   03/27/16 1047 03/27/16 1053  BP: 129/61 126/75  Pulse: 74 75  Temp: 98.2 F (36.8 C)   TempSrc: Oral   Resp: 16   Height: 6\' 1"  (1.854 m)     Weight: 227 lb (102.967 kg)   SpO2: 98%     GENERAL: The patient is a well-nourished male, in no acute distress. The vital signs are documented above. CARDIAC: There is a regular rate and rhythm.  VASCULAR: Carotid arteries without bruits bilaterally. Well-healed right neck incision PULMONARY: There is good air exchange bilaterally without wheezing or rales. MUSCULOSKELETAL: There are no major deformities or cyanosis. NEUROLOGIC: No focal weakness or paresthesias are detected. SKIN: There are no ulcers or rashes noted. PSYCHIATRIC: The patient has a normal affect.  DATA:  Carotid duplex from one week ago was reviewed with the patient. This reveals widely patent right endarterectomy with no evidence of stenosis. Some plaque present but no stenosis in the left carotid  MEDICAL ISSUES: Stable status post right carotid endarterectomy for severe asymptomatic disease August 2016. We'll continue his usual activities. We will see him again in one month with repeat carotid duplex and then drop back to yearly assuming his study is unchanged. No Follow-up on file.   Curt Jews [facility] [place]: [phone]

## 2016-06-15 LAB — HM DIABETES EYE EXAM

## 2016-06-25 ENCOUNTER — Other Ambulatory Visit: Payer: Self-pay | Admitting: Cardiovascular Disease

## 2016-07-06 ENCOUNTER — Telehealth: Payer: Self-pay | Admitting: Pulmonary Disease

## 2016-07-06 MED ORDER — CLOPIDOGREL BISULFATE 75 MG PO TABS: 75.0000 mg | ORAL_TABLET | Freq: Every day | ORAL | Status: DC

## 2016-07-06 NOTE — Telephone Encounter (Signed)
Spoke with pt. He is requesting a refill on Plavix. Rx has been sent in. Nothing further was needed.

## 2016-07-12 ENCOUNTER — Encounter: Payer: Self-pay | Admitting: Pulmonary Disease

## 2016-07-25 ENCOUNTER — Telehealth: Payer: Self-pay | Admitting: Pulmonary Disease

## 2016-07-25 DIAGNOSIS — E559 Vitamin D deficiency, unspecified: Secondary | ICD-10-CM

## 2016-07-25 DIAGNOSIS — I739 Peripheral vascular disease, unspecified: Secondary | ICD-10-CM

## 2016-07-25 DIAGNOSIS — F419 Anxiety disorder, unspecified: Secondary | ICD-10-CM

## 2016-07-25 DIAGNOSIS — I10 Essential (primary) hypertension: Secondary | ICD-10-CM

## 2016-07-25 DIAGNOSIS — D126 Benign neoplasm of colon, unspecified: Secondary | ICD-10-CM

## 2016-07-25 DIAGNOSIS — I779 Disorder of arteries and arterioles, unspecified: Secondary | ICD-10-CM

## 2016-07-25 DIAGNOSIS — Z125 Encounter for screening for malignant neoplasm of prostate: Secondary | ICD-10-CM

## 2016-07-25 DIAGNOSIS — E785 Hyperlipidemia, unspecified: Secondary | ICD-10-CM

## 2016-07-25 DIAGNOSIS — R7301 Impaired fasting glucose: Secondary | ICD-10-CM

## 2016-07-25 NOTE — Telephone Encounter (Signed)
LMTCB

## 2016-07-26 NOTE — Telephone Encounter (Signed)
Pt returned call He is asking to have labs done prior to his 8.2.17 appt with SN, and to have a CRP and Vit D included.  Spoke with Marliss Czar, this is okay to do.  Pt is planning on coming tomorrow, he is aware to be fasting.  Orders placed Nothing further needed; will sign off

## 2016-07-27 ENCOUNTER — Other Ambulatory Visit (INDEPENDENT_AMBULATORY_CARE_PROVIDER_SITE_OTHER): Payer: Medicare Other

## 2016-07-27 DIAGNOSIS — F419 Anxiety disorder, unspecified: Secondary | ICD-10-CM | POA: Diagnosis not present

## 2016-07-27 DIAGNOSIS — R7301 Impaired fasting glucose: Secondary | ICD-10-CM

## 2016-07-27 DIAGNOSIS — D126 Benign neoplasm of colon, unspecified: Secondary | ICD-10-CM | POA: Diagnosis not present

## 2016-07-27 DIAGNOSIS — I779 Disorder of arteries and arterioles, unspecified: Secondary | ICD-10-CM

## 2016-07-27 DIAGNOSIS — E559 Vitamin D deficiency, unspecified: Secondary | ICD-10-CM

## 2016-07-27 DIAGNOSIS — E785 Hyperlipidemia, unspecified: Secondary | ICD-10-CM | POA: Diagnosis not present

## 2016-07-27 DIAGNOSIS — I739 Peripheral vascular disease, unspecified: Principal | ICD-10-CM

## 2016-07-27 DIAGNOSIS — I10 Essential (primary) hypertension: Secondary | ICD-10-CM

## 2016-07-27 DIAGNOSIS — Z125 Encounter for screening for malignant neoplasm of prostate: Secondary | ICD-10-CM

## 2016-07-27 LAB — CBC WITH DIFFERENTIAL/PLATELET
Basophils Absolute: 0 10*3/uL (ref 0.0–0.1)
Basophils Relative: 0.4 % (ref 0.0–3.0)
Eosinophils Absolute: 0.2 10*3/uL (ref 0.0–0.7)
Eosinophils Relative: 2.5 % (ref 0.0–5.0)
HCT: 46.5 % (ref 39.0–52.0)
Hemoglobin: 15.9 g/dL (ref 13.0–17.0)
Lymphocytes Relative: 19.4 % (ref 12.0–46.0)
Lymphs Abs: 1.2 10*3/uL (ref 0.7–4.0)
MCHC: 34.1 g/dL (ref 30.0–36.0)
MCV: 100.7 fl — ABNORMAL HIGH (ref 78.0–100.0)
Monocytes Absolute: 0.7 10*3/uL (ref 0.1–1.0)
Monocytes Relative: 11.2 % (ref 3.0–12.0)
Neutro Abs: 4.1 10*3/uL (ref 1.4–7.7)
Neutrophils Relative %: 66.5 % (ref 43.0–77.0)
Platelets: 179 10*3/uL (ref 150.0–400.0)
RBC: 4.62 Mil/uL (ref 4.22–5.81)
RDW: 13.9 % (ref 11.5–15.5)
WBC: 6.1 10*3/uL (ref 4.0–10.5)

## 2016-07-27 LAB — BASIC METABOLIC PANEL
BUN: 16 mg/dL (ref 6–23)
CO2: 32 mEq/L (ref 19–32)
Calcium: 9.2 mg/dL (ref 8.4–10.5)
Chloride: 102 mEq/L (ref 96–112)
Creatinine, Ser: 1.13 mg/dL (ref 0.40–1.50)
GFR: 67.66 mL/min (ref >60.00)
Glucose, Bld: 120 mg/dL — ABNORMAL HIGH (ref 70–99)
Potassium: 4 mEq/L (ref 3.5–5.1)
Sodium: 139 mEq/L (ref 135–145)

## 2016-07-27 LAB — LIPID PANEL
Cholesterol: 122 mg/dL (ref 0–200)
HDL: 28.5 mg/dL — ABNORMAL LOW (ref >39.00)
LDL Cholesterol: 56 mg/dL (ref 0–99)
NonHDL: 93.71
Total CHOL/HDL Ratio: 4
Triglycerides: 188 mg/dL — ABNORMAL HIGH (ref 0.0–149.0)
VLDL: 37.6 mg/dL (ref 0.0–40.0)

## 2016-07-27 LAB — HEPATIC FUNCTION PANEL
ALT: 21 U/L (ref 0–53)
AST: 26 U/L (ref 0–37)
Albumin: 3.9 g/dL (ref 3.5–5.2)
Alkaline Phosphatase: 68 U/L (ref 39–117)
Bilirubin, Direct: 0.2 mg/dL (ref 0.0–0.3)
Total Bilirubin: 0.8 mg/dL (ref 0.2–1.2)
Total Protein: 6.5 g/dL (ref 6.0–8.3)

## 2016-07-27 LAB — HEMOGLOBIN A1C: Hgb A1c MFr Bld: 6.4 % (ref 4.6–6.5)

## 2016-07-27 LAB — C-REACTIVE PROTEIN: CRP: 0.1 mg/dL — ABNORMAL LOW (ref 0.5–20.0)

## 2016-07-27 LAB — PSA: PSA: 2.28 ng/mL (ref 0.10–4.00)

## 2016-07-27 LAB — VITAMIN D 25 HYDROXY (VIT D DEFICIENCY, FRACTURES): VITD: 37.9 ng/mL (ref 30.00–100.00)

## 2016-07-27 LAB — TSH: TSH: 2.21 u[IU]/mL (ref 0.35–4.50)

## 2016-08-01 ENCOUNTER — Ambulatory Visit (INDEPENDENT_AMBULATORY_CARE_PROVIDER_SITE_OTHER)
Admission: RE | Admit: 2016-08-01 | Discharge: 2016-08-01 | Disposition: A | Discharge status: Home / Self Care | Payer: Medicare Other | Source: Ambulatory Visit | Attending: Pulmonary Disease | Admitting: Pulmonary Disease

## 2016-08-01 ENCOUNTER — Ambulatory Visit (INDEPENDENT_AMBULATORY_CARE_PROVIDER_SITE_OTHER): Payer: Medicare Other | Admitting: Pulmonary Disease

## 2016-08-01 ENCOUNTER — Encounter: Payer: Self-pay | Admitting: Pulmonary Disease

## 2016-08-01 VITALS — BP 130/62 | HR 76 | Temp 97.6°F | Ht 73.0 in | Wt 226.1 lb

## 2016-08-01 DIAGNOSIS — E559 Vitamin D deficiency, unspecified: Secondary | ICD-10-CM

## 2016-08-01 DIAGNOSIS — Z951 Presence of aortocoronary bypass graft: Secondary | ICD-10-CM | POA: Diagnosis not present

## 2016-08-01 DIAGNOSIS — I872 Venous insufficiency (chronic) (peripheral): Secondary | ICD-10-CM

## 2016-08-01 DIAGNOSIS — Z9889 Other specified postprocedural states: Secondary | ICD-10-CM

## 2016-08-01 DIAGNOSIS — I779 Disorder of arteries and arterioles, unspecified: Secondary | ICD-10-CM | POA: Diagnosis not present

## 2016-08-01 DIAGNOSIS — I2510 Atherosclerotic heart disease of native coronary artery without angina pectoris: Secondary | ICD-10-CM | POA: Diagnosis not present

## 2016-08-01 DIAGNOSIS — E785 Hyperlipidemia, unspecified: Secondary | ICD-10-CM

## 2016-08-01 DIAGNOSIS — R7301 Impaired fasting glucose: Secondary | ICD-10-CM

## 2016-08-01 DIAGNOSIS — I739 Peripheral vascular disease, unspecified: Secondary | ICD-10-CM

## 2016-08-01 DIAGNOSIS — E663 Overweight: Secondary | ICD-10-CM

## 2016-08-01 DIAGNOSIS — I10 Essential (primary) hypertension: Secondary | ICD-10-CM

## 2016-08-01 HISTORY — DX: Presence of aortocoronary bypass graft: Z95.1

## 2016-08-01 NOTE — Patient Instructions (Signed)
Today we updated your med list in our EPIC system...    Continue your current medications the same...  We reviewed your recent FASTING blood work & gave you a copy for your records...    REMEMBER to follow a low carb, low fat diet, increase your exercise, and work on weight reduction...  Today we did a follow up CXR...    We will contact you w/ the results when available...   Call for any questions...  Let's plan a follow up visit in 65mo, sooner if needed for problems.Marland KitchenMarland Kitchen

## 2016-08-01 NOTE — Progress Notes (Signed)
Subjective:    Patient ID: [name] [name], male    DOB: [birth year], 75 y.o.   MRN: [id number]  HPI 75 y/o WM here for a follow up visit... he has multiple medical problems as noted below...   SEE PREV EPIC NOTES FOR EARLIER DATA>>   LABS 6/14:  FLP- on Lip10 w/ TChol/LDL ok but TG/HDL still off;  Chems- ok x BS=136, A1c=6.4;  CBC- wnl;  TSH=2.66;  PSA=1.65;  CRP done at pt request=0.5....  CXR 7/15 showed cardiomeg, no change in contour of mediastinum compared to mult old films, clear lungs, NAD...   CDopplers 7/15 showed 60-79% RICAstenosis & [id number] LICAstenosis (no change)...   LABS 7/15:  FLP- TChol&LDL ok on Lip10 but TG incr & HDL too low;  Chems- ok w/ BS=115A1c=6.2;  CBC- wnl;  TSH=2.51;  PSA=1.78...   ~  January 28, 2015:  54mo ROV & [name] [name] reports a good interval- no new medical complaints or concerns; he has however been under a lot of stress regarding his 21y/o grand daughter (Hx obesity >300#, subseq gastric bypass in [place], now Dx w/ anorexia, & she was the victim of a sexual assault); he has also had 4 friends pass away over the last month, mult funerals etc;  We discussed these issues 7 offered anxiolytic but he declines 7 he seems o be handling it all as well as can be expected, he will call if he feels he needs Rx...     [name] on Diovan160 & HCT 12.5; Norvasc caused swelling; BP= 130/66 & feeling well w/o CP, palpit, dizzy, SOB, edema...    He saw [name] for Cards 8/15> known CAD & prev CABG 2005, normal LVF, on ASA/Plavix; stable on meds and no changes made...     He saw [facility] for f/u Carotid stenosis 1/16> he remains on ASA/Plavix & asymptomatic w/o signs of cerebral ischemia; f/u CDoppler 1/16 (no bruits) showed stable 60-79% right ICAstenosis & left <40% ICAstenosis (f/u Q50mo).    Chol> well controlled on Lip20; he is intol to Niacin... We reviewed prob list, meds, xrays and labs> see below for updates >> Given 2015 flu vaccine today...  ~  July 29, 2015:  35mo ROV &  [name] [name] reports doing satis "I'm handling stress better these days"- continued issues w/ 67 y/o grand-daughter... We reviewed the following medical problems during today's office visit >>     [name] on Diovan160 & HCT-12.5 (intol to BBlockers in past); BP= 142/64 & he says good at home too; denies CP, palpit, SOB, edema, etc...    CAD> s/p 4 vessel CABG 2005, on ASA/ Plavix; he saw DrNishan 8/15 for yearly check up- stable & no changes made...    Cerebrovasc Dis> on ASA/ Plavix; followed by [name] & seen 1/16 w/ f/u CDopplers stable w/ 60-79% right ICA stenosis, <40% left ICA stenosis, they are checking Q54mo...    CHOL> on Lip10- taking 1/2 of 20mg  tab daily; FLP 7/16 showed TChol 129, TG 182, HDL 31, LDL 62; he is intol Niacin & rec to incr exercise program & get wt down...    Overweight> weight= 226# which is up 2#; we reviewed diet, exercise, & wt loss program...    Hx colon polyps> f/u colonoscopy 2/13 by [name]- neg, no polyps & he rec f/u in 71yrs...    GU> he reports no benefit from Sildenafil & prev requested Urology appt to discuss additional alternatives but he never went...    DJD/ Vit D defic> he uses OTC  analgesics as needed; continues on Vit D 2000u daily w/ last VitD level 6/13= 34...    Hx TIA> SEE 6/10 Hosp> sm vessel dis, remote lacunar infarcts, intracranial atherosclerotic changes on MRA; CDopplers followed by [name] as above w/ 60-79% RICAstenosis; on ASA/Plavix...    Keloid> in lower sternal scar w/ plastic repair in past; he saw Derm 2016 for removal of SK, epidermoid cyst, lipoma EXAM reveals Afeb, VSS, O2sat=97% on RA;  HEENT- neg x wax;  Vasc- bilat C bruits R>L;  Chest- clear w/o w/r/r;  Heart- RR gr1/6SEM w/o r/g;  Abd- obese, soft, neg;  Ext- +VV/VI, w/o c/c/e... We reviewed prob list, meds, xrays and labs> see below for updates >>   LABS 7/16:  FLP- at goals on Cres5;  Chems- wnl x BS=111, A1c=6.1;  CBC- wnl;  TSH=3.01;  PSA=2.14...  ~  January 30, 2016:  50mo [name] had right CAE w/ DPA 08/26/15 by Sharlot Gowda for a progressive stenosis & did well;  He had cardiac clearance due to his hx CAD w/ prev CABG in 2005 & norm LVF; Myoview 08/18/15 showed c/w prior MI w/ peri-infarct ischemia, EF=55-65%, felt to be a low risk study;  EKG showed NSR, rate76/min, wnl, NAD;  He remains on ASA/ Plavix;  BP controlled on Diovan160, HCT12.5, w/ BP=138/72 today & he denies HAs, CP, palpit, SOB, edema;  He is reminded about diet/ exercise/ wt reduction...     He developed some urinary retention after carotid surg 08/2015- required in & out cath x2 & no prob since...     He has DJD & c/o some left hip pain; takes OTC analgesics as needed & he will check into Ortho (DrGraves) eval soon... EXAM reveals Afeb, VSS, O2sat=97% on RA;  HEENT- neg, mallampati2; Vasc- leftt C bruit & s/p R-CAE;  Chest- clear w/o w/r/r;  Heart- RR gr1/6SEM w/o r/g;  Abd- obese, soft, diastasis;  Ext- +VV/VI, w/o c/c/ +tr edema... IMP/PLAN>>  [name] [name] is stable, continue same meds, needs incr exerc & wt reduction; OK Flu shot today...   ~  August 01, 2016:  11mo ROV & [name] [name] is doing well, no new complaints or concerns- notes some fatigue but says "I'm feeling pretty good- just under lots of stress" w/ daugh financial problems, older grandkids, bro-in-law depression & bladder Ca, etc;  He has not been exercising & has not lost weight     [name] on Diovan160 & HCT-12.5 (intol to BBlockers in past); BP= 142/64 & he says good at home too; denies CP, palpit, SOB, edema, etc...    CAD> s/p 4 vessel CABG 2005, on ASA/ Plavix; he saw [name] 2/17 for yearly check up- stable & no changes made; he had Nuclear stress test 08/2015 showing prior MI & EF=56%, low risk study...    Cerebrovasc Dis> s/p R-CAE 08/2015- on ASA/ Plavix; followed by [name] & last seen 3/17 w/ f/u CDopplers showing patent R endart site w/o ICA stenosis, and [id number] LICA stenosis...    CHOL> on Lip10- taking 1/2 of 20mg  tab daily; FLP 7/16 showed TChol  129, TG 182, HDL 31, LDL 62; he is intol Niacin & rec to incr exercise program & get wt down...    Overweight> weight= 226# which is stable; we reviewed diet, exercise, & wt loss program...    Hx colon polyps> last colonoscopy 2/13 by [name]- neg, no polyps & he rec f/u in 16yrs...    GU> he reports no benefit from Sildenafil & prev requested  Urology appt to discuss additional alternatives but he never went...    DJD/ Vit D defic> he uses OTC analgesics as needed; continues on Vit D 2000u daily w/ last VitD level 6/13= 34...    Hx TIA> SEE 6/10 Hosp> sm vessel dis, remote lacunar infarcts, intracranial atherosclerotic changes on MRA; CDopplers followed by [name] as above w/ 60-79% RICAstenosis; on ASA/Plavix...    Keloid> in lower sternal scar w/ plastic repair in past; he saw Derm 2016 for removal of SK, epidermoid cyst, lipoma EXAM reveals Afeb, VSS, O2sat=97% on RA;  HEENT- neg x wax;  Vasc- bilat C bruits R>L;  Chest- clear w/o w/r/r;  Heart- RR gr1/6SEM w/o r/g;  Abd- obese, soft, neg;  Ext- +VV/VI, w/o c/c/e...  LABS 07/27/16>  FLP- ok on Lip20 x TG=188, HDL=29;  Chems- wnl x BS=120, A1c=6.4 on diet alone;  CBC- wnl;  TSH=2.21;  PSA=2.28  CXR 08/01/16>  Stable heart size & post-op changes, lungs clear- NAD... IMP/PLAN>>  He is on a simple medication regimen- continue same; most important however is diet/ exercise/ wt reduction- this would help his stress level as well, discussed in detail w/ pt... Note: >50% of this 25 min ROV was spent in counseling and coordination of care...           Problem List:    ENT >>  He sawDrWolicki [id number] for cerumen, hearing loss, presbycusis- candidate for hearing aides but he is holding off...  HYPERTENSION (ICD-401.9) >> ~  6/09:  prev on Atenolol25mg - stopped due to side effects & feels much better off BBlockers. ~  2/10:  Avapro300 changed to Diovan160 per his insurance company for $$$ reasons. ~  11/11:  BP= 150/80 & remains asymptomatic, doesn't  want to incr meds- discussed diet/ exerc... ~  5/12:  BP= 132/70 & denies HA, fatigue, visual changes, CP, palipit, dizziness, syncope, dyspnea, edema, etc... ~  1/13:  BP= 152/68 & he remains asymptomatic; we decided to add NORVASC 5mg /d ==> pt stopped on his own due to swelling. ~  6/13:  He saw DrNishan for f/u & BP= 154/80 on Diovan alone; added HCTZ 12.5mg /d... ~  7/13:  BP= 148/82 here & 130s/70s at home he says on DIOVAN160mg /d & HCTZ 12.5mg /d; continue same ~  CXR 7/13 showed cardiomeg, s/p CABG, bronchitic changes at lung bases, NAD... ~  1/14: on Diovan160 & HCT-12.5 (intol to BBlockers in past); BP= 142/80 & he says better at home; denies CP, palpit, SOB, edema, etc...  ~  7/14: on Diovan160 & HCT-12.5 (intol to BBlockers in past); BP= 140/70 & he remains asymptomatic... ~  1/15: on Diovan160 & HCT-12.5; BP= 130/60 & he says good at home too; denies CP, palpit, SOB, edema, etc ~  CXR 7/15 showed cardiomeg, no change in contour of mediastinum compared to mult old films, clear lungs, NAD ~  1/16:  on Diovan160 & HCT 12.5; Norvasc caused swelling; BP= 130/66 & feeling well w/o CP, palpit, dizzy, SOB, edema. ~  7/16: on Diovan160 & HCT-12.5 (intol to BBlockers in past); BP= 142/64 & he says even better at home; he remains asymptomatic.  CORONARY ARTERY DISEASE (ICD-414.00) - S/P 4 vessel CABG 2/05 by DrVanTrigt> on ASA/ PLAVIX & followed by [name] yearly & his notes are reviewed; he is intol to BBlocker therapy as noted... ~  1/13 & 7/13:  stable & denies angina, palpit, SOB, edema, etc; EKG showed NSR, rate71, WNL, NAD.[name].  ~  8/14: he had yearly f/u DrNishan> CAD, s/pCABG  in 2005, norm LVF; EKG showed NSR, rate77, NSSTTWA, NAD; stable, rec to incr exercise & lose wt; no change in meds...  ~  8/15: He saw [name] for yearly Cards visit> known CAD & prev CABG 2005, normal LVF, on ASA/Plavix; stable on meds and no changes made  CEREBROVASCULAR DISEASE (ICD-437.9) - see above> on ASA 81mg /d,  & PLAVIX 75mg /d... followed by Sharlot Gowda for [facility]. ~  6/10:  See 6/10 Hosp records & f/u by [name] in his office... ~  6/11:  seen by [name] & stable w/o TIAs or amaurosis; CDopplers were stable w/ bilat 40-59% ICA stenoses, f/u 6yr. ~  He was due for a follow up eval 6/12> we don't have records & will call for the report... ~  6/13:  He saw [name] for f/u asymptomatic carotid dis> CDoppler 6/13 showed some progression of right sided stenosis (now 60-79%) but denies cerebral ischemic symptoms (right side remains 40-59%); continue ASA/Plavix & f/u planned 74mo. ~  1/14:  f/u CDopplers were stable w/ 60-79% right ICA stenosis & 20-39% left ICA stenosis & f/u planned in 65mo... ~  7/14:  f/u CDopplers at [facility] showed 60-79% right ICAstenosis and 0-40% left ICAstenosis- f/u Q11mo... ~  1/15:  f/u w/ [facility] for known Carotid stenosis> CDuplex showed 60-79% right carotid stenosis and 40-59% left carotid stenosis; continue ASA/Plavix and lifestyle mod strategies... ~  7/15:  f/u w/ [facility] for known Carotid stenosis> CDuplex unchanges w/ 60-79% RICAstenosis... ~  1/16: He saw [facility] for f/u Carotid stenosis> he remains on ASA/Plavix & asymptomatic w/o signs of cerebral ischemia; f/u CDoppler 1/16 (no bruits) showed stable 60-79% right ICAstenosis & left <40% ICAstenosis (f/u Q34mo).  VENOUS INSUFFICIENCY (ICD-459.81) - he is aware of need to elim salt, elevate legs, wear support hose.  HYPERCHOLESTEROLEMIA (ICD-272.0) - controlled on LIPITOR 20- 1/2 tab daily... he has tried NIACIN in the past and refuses to take it again due to headaches. ~  FLP 5/08 showed TChol 125, TG 171, HDL 24, LDL 67...  ~  [place] 12/08 showed TChol 107, TG 131, HDL 23, LDL 58... rec- same med, better diet & exercise. ~  FLP 2/10 on Lip10 (wt=235#) showed TChol 119, TG 158, HDL 24, LDL 63... rec- ditto ~  [place] 11/10 on Lip10 (wt=236#) showed TChol 115, TG 128, HDL 28, LDL 62 ~  FLP 5/11 on Lip10 (wt=233#) showed TChol 119, TG 144, HDL 27, LDL 63 ~   DrNishan suggested that he cut the Lipitor to 5mg /d & add Niaspan, but he refuses the Niacin preps due to HAs & wants to contin Lip10. ~  FLP 11/11 on Lip10 (wt=235#) showed TChol 119, TG 113, HDL 31, LDL 66 ~  FLP 5/12 on Lip10 (wt=229#) showed TChol 118, TG 150, HDL 28, LDL 60 ~  FLP 6/13 on Lip10 (wt=232#) showed TChol 127, TG 164, HDL 27, LDL 67... He tells me [name] wanted him in an HDL trial... ~  6/14: on Lip20- taking 1/2 tab daily; FLP 6/14 shows TChol 126, TG 155, HDL 32, LDL 63; he is intol Niacin & rec to incr exercise program & get wt down ~  FLP 7/15 on Lip10 showed TChol 118, TG 166, HDL 29, LDL 56... ~  FLP 7/16 on Lip10 showed TChol 129, TG 182, HDL 31, LDL 62  DIABETES MELLITUS, BORDERLINE (ICD-790.29) - On diet Rx alone... ~  labs in hosp 6/10 showed BS= 109-113 ~  labs 11/10 showed BS= 109, A1c= 5.9 ~  labs 5/11  showed BS= 107 ~  labs 11/11 showed BS= 108 ~  Labs 5/12 showed BS= 97, A1c= 6.0 ~  Labs 6/13 showed BS=122, A1c= 6.4... Needs better diet & wt reduction. ~  Labs 6/14 showed BS= 136, A1c= 6.4 ~  Labs 7/15 on diet alone showed BS=115, A1c= 6.2 ~  Labs 7/16 on diet alone showed BS= 111, A1c= 6.1  OVERWEIGHT (ICD-278.02) - he is 6'2" tall and weighs ~230# for a BMI of 30... we have discussed diet & exercise stategies. ~  7/15: he has lost 6# down to 224# today... ~  7/16: he has gained 2# to 226#  COLONIC POLYPS (ICD-211.3) - last colonoscopy 10/07 by [name] showed several 5-6 mm polyps... path= tubular adenomas ... f/u planned 5 years. ~  Colonoscopy 2/13 by [name] was neg- no recurrent polyps etc & he felt that 41yr f/u was appropriate...  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 Rx w/ PT.  VITAMIN D DEFIC >> on Vit D supplement 2000u  daily... ~  Labs 2/10 showed Vit D level = 14... rec to start Vit D supplement OTC... ~  Labs 6/13 showed Vit D level = 34... rec to continue his VitD supplement daily...  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 ~  6/10:  Hosp w/ TIA w/ left face & arm symptoms that resolved quickly; CT showed small vessel disease;  MRI showed bilat remote lacunar infarcts, no acute infarct, +sm vessel dis;  MRA showed intracranial atherosclerotic changes, & ?focal stenosis (?70%) of left ICA in the neck; he saw [name] 06/08/09 (no bruits) w/ CDopplers done showing bilat 40-59% carotid stenoses & agreed w/ ASA + Plavix and f/u 24yr...  ~  6/13: saw [name] for f/u asymptomatic carotid dis> CDoppler 6/13 showed some progression of right sided stenosis (now 60-79%) but denies cerebral ischemic symptoms (right side remains 40-59%); continue ASA/Plavix & f/u planned 23mo. ~  1/14: he continues Q72mo f/u w/ [facility]- stable w/o cerebral ischemic symptoms => CDopplers are stable (see above).  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 he had plastic surg by [facility] 5/09 w/ improved scar. ~  He has seb cyst over CSpine & he tells me that DrTafeen is going to remove this soon... ~  He has a small knot/lesion ?cyst over the fibular head on lat side of left lower leg; stable, no discomfort, no change & offered Ortho eval but he prefers to just watch it for now... ~  2016> he saw Derm for removal of  SK, epidermoid cyst, lipoma...  HEALTH MAINTENANCE: ~  GI:  followed by [name] & last colonoscopy was 10/07 w/ f/u planned 36yrs. ~  GU:  he is up to date on DRE & PSA checks here (PSA remains in the 1-2 range). ~  Immunizations:  he had Bantam in 2000 w/ repeat PNEUMOVAX 11/10 & TDAP 5/11... he gets the yearly Seasonal Flu vaccine each fall...   Past Surgical History:  Procedure Laterality Date  . CARDIAC CATHETERIZATION  02/2004   "tried to  stent; couldn't"  . CAROTID ENDARTERECTOMY Right 08/26/2015  . CORONARY ANGIOPLASTY    . CORONARY ARTERY BYPASS GRAFT  Feb. 2005   4 vessel  . ENDARTERECTOMY Right 08/26/2015   Procedure:  Right Carotid ENDARTERECTOMY with Patch Angioplasty ;  Surgeon: Rosetta Posner, MD;  Location: [place];  Service: Vascular;  Laterality: Right;  . 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 Dessie Coma  . KELOID EXCISION    . PILONIDAL CYST EXCISION  1989    Outpatient Encounter Prescriptions as of 08/01/2016  Medication Sig Dispense Refill  . aspirin 81 MG tablet Take 81 mg by mouth daily.      [name] atorvastatin (LIPITOR) 20 MG tablet Take 0.5 tablets (10 mg total) by mouth daily. Take as directed 30 tablet 11  . Cholecalciferol (VITAMIN D) 2000 UNITS CAPS Take 1 capsule by mouth daily.     . clopidogrel (PLAVIX) 75 MG tablet Take 1 tablet (75 mg total) by mouth daily. 90 tablet 1  . hydrochlorothiazide (MICROZIDE) 12.5 MG capsule TAKE 1 CAPSULE (12.5 MG TOTAL) BY MOUTH DAILY. 30 capsule 11  . nitroGLYCERIN (NITROSTAT) 0.4 MG SL tablet Place 1 tablet (0.4 mg total) under the tongue every 5 (five) minutes as needed for chest pain. 25 tablet 3  . valsartan (DIOVAN) 160 MG tablet TAKE 1 TABLET BY MOUTH DAILY 90 tablet 1   No facility-administered encounter medications on file as of 08/01/2016.     Allergies  Allergen Reactions  . Niacin Other (See Comments)    REACTION: intol to NIACIN w/ headaches    Current Medications, Allergies, Past Medical History, Past Surgical History, Family History, and Social History were reviewed in [employer] record.    Review of Systems         See HPI - all other systems neg except as noted... The patient denies anorexia, fever, weight loss, weight gain, vision loss, decreased hearing, hoarseness, chest pain, syncope, dyspnea on exertion, peripheral edema, prolonged cough, headaches, hemoptysis, abdominal pain, melena, hematochezia, severe  indigestion/heartburn, hematuria, incontinence, muscle weakness, suspicious skin lesions, transient blindness, difficulty walking, depression, unusual weight change, abnormal bleeding, enlarged lymph nodes, and angioedema.     Objective:   Physical Exam    WD, Overweight, 74 y/o WM in NAD... Vital Signs:  Reviewed> GENERAL:  Alert & oriented; pleasant & cooperative. HEENT:  [place]/AT, EOM-wnl, PERRLA, Fundi-benign, EACs-clear, TMs-wnl, NOSE-clear, THROAT-clear & wnl. NECK:  Supple w/ fairROM; no JVD; normal carotid impulses w/o bruits; no thyromegaly or nodules palpated; no lymphadenopathy. CHEST:  Clear to P & A; without wheezes/ rales/ or rhonchi. HEART:  Median sternotomy scar w/ improved keloid, regular rhythm; without murmurs/ rubs/ or gallops. ABDOMEN:  Soft & nontender; normal bowel sounds; no organomegaly or masses detected. 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 EXT: without deformities, mild arthritic changes; + venous insuffic changes, no edema. NEURO:  CN's intact; motor testing normal; sensory testing normal; gait normal & balance OK. DERM:  Keloid as noted, no other lesions...  RADIOLOGY DATA:  Reviewed in the EPIC EMR & discussed w/ the patient...  LABORATORY DATA:  Reviewed in the EPIC EMR & discussed w/ the patient...   Assessment & Plan:    [name]  On Diovan160 & HCT 12.5 w/ improved/stable BP; needs to lose wt & elim sodium...  CAD, s/p CABG 2005> followed by [name] for Cards, stable & doing satis...  CEREBROVASC DIS/ TIA>  No cerebral ischemic symptoms on the ASA/ Plavix; he had right CEA 8/16 by [name]...  CHOL>  On Lip10 & stable; needs better low fat diet & get wt down...  Borderline DM/ IFG> adeq control w/ diet alone but he understands  the importance of wt reduction.  GI>  He had colonoscopy f/u 2/13 & it was neg...  Other medical problems as noted... 08/01/16>   He is on a simple medication regimen- continue same; most important however is diet/  exercise/ wt reduction- this would help his stress level as well, discussed in detail w/ pt...    Patient's Medications  New Prescriptions   No medications on file  Previous Medications   ASPIRIN 81 MG TABLET    Take 81 mg by mouth daily.     ATORVASTATIN (LIPITOR) 20 MG TABLET    Take 0.5 tablets (10 mg total) by mouth daily. Take as directed   CHOLECALCIFEROL (VITAMIN D) 2000 UNITS CAPS    Take 1 capsule by mouth daily.    CLOPIDOGREL (PLAVIX) 75 MG TABLET    Take 1 tablet (75 mg total) by mouth daily.   HYDROCHLOROTHIAZIDE (MICROZIDE) 12.5 MG CAPSULE    TAKE 1 CAPSULE (12.5 MG TOTAL) BY MOUTH DAILY.   NITROGLYCERIN (NITROSTAT) 0.4 MG SL TABLET    Place 1 tablet (0.4 mg total) under the tongue every 5 (five) minutes as needed for chest pain.   VALSARTAN (DIOVAN) 160 MG TABLET    TAKE 1 TABLET BY MOUTH DAILY  Modified Medications   No medications on file  Discontinued Medications   No medications on file

## 2016-08-13 NOTE — Progress Notes (Signed)
Patient ID: [name] [name], male   DOB: 03/16/1941, 75 y.o.   MRN: [id number]     Cardiology Office Note   Date:  08/14/2016   ID:  [name] [name], DOB Jul 25, 1941, MRN [id number]  PCP:  Noralee Space, MD  Cardiologist:  Dr. Johnsie Cancel    Chief Complaint  Patient presents with  . Coronary Artery Disease    no sx      History of Present Illness: [name] [name] is a 75 y.o. male  known coronary disease with previous coronary bypass surgery in 2005. he has normal LV function. August 2016 Had right CEA with Dr Early 08/26/15    Nuclear 08/18/15     Nuclear stress EF: 56%.  There was no ST segment deviation noted during stress.  Defect 1: There is a medium defect of moderate severity present in the basal inferior, basal inferolateral, mid inferior and mid inferolateral location. May be related to diaphragmatic attenuation, but cannot rule out infarct with peri-infarct ischemia.  This is a low risk study.  Findings consistent with prior myocardial infarction with peri-infarct ischemia.  The left ventricular ejection fraction is normal (55-65%).   Grandson plays baseball for [employer] No angina.  Neck healed well  Reviewed duplex 03/23/16 [facility] patent right CEA plaque no stenosis left ICA  Past Medical History:  Diagnosis Date  . Carotid artery occlusion   . Cerebrovascular disease   . Coronary artery disease    s/p CABG 2005; sees Dr Johnsie Cancel yearly  . Gynecomastia   . Hypercholesterolemia   . Hypertension   . Keloid   . Neurodermatitis   . Overweight(278.02)   . Personal history of colonic polyps 10/08/2006   tubular adenomas  . Renal insufficiency   . Shoulder pain   . Transient ischemic attack 2006   "lasted ~ 5 seconds"  . Vitamin D deficiency     Past Surgical History:  Procedure Laterality Date  . CARDIAC CATHETERIZATION  02/2004   "tried to stent; couldn't"  . CAROTID ENDARTERECTOMY Right 08/26/2015  . CORONARY ANGIOPLASTY    . CORONARY ARTERY  BYPASS GRAFT  Feb. 2005   4 vessel  . ENDARTERECTOMY Right 08/26/2015   Procedure: Right Carotid ENDARTERECTOMY with Patch Angioplasty ;  Surgeon: Rosetta Posner, MD;  Location: [place];  Service: Vascular;  Laterality: Right;  . 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 Dessie Coma  . KELOID EXCISION    . PILONIDAL CYST EXCISION  1989     Current Outpatient Prescriptions  Medication Sig Dispense Refill  . aspirin 81 MG tablet Take 81 mg by mouth daily.      [name] atorvastatin (LIPITOR) 20 MG tablet Take 0.5 tablets (10 mg total) by mouth daily. Take as directed 30 tablet 11  . Cholecalciferol (VITAMIN D) 2000 UNITS CAPS Take 1 capsule by mouth daily.     . clopidogrel (PLAVIX) 75 MG tablet Take 1 tablet (75 mg total) by mouth daily. 90 tablet 1  . hydrochlorothiazide (MICROZIDE) 12.5 MG capsule TAKE 1 CAPSULE (12.5 MG TOTAL) BY MOUTH DAILY. 30 capsule 11  . nitroGLYCERIN (NITROSTAT) 0.4 MG SL tablet Place 1 tablet (0.4 mg total) under the tongue every 5 (five) minutes as needed for chest pain. 25 tablet 3  . valsartan (DIOVAN) 160 MG tablet TAKE 1 TABLET BY MOUTH DAILY 90 tablet 1   No current facility-administered medications for this visit.     Allergies:   Niacin    Social History:  The patient  reports that he has quit smoking. His smokeless tobacco use includes Snuff. He reports that he does not drink alcohol or use drugs.   Family History:  The patient's family history includes Cancer in his father; Diabetes in his brother, mother, and sister; Heart attack in his mother; Heart disease in his brother and mother; Kidney disease in his mother; Lung cancer in his paternal uncle.    ROS:  General:no colds or fevers, no weight changes Skin:no rashes or ulcers HEENT:no blurred vision, no congestion CV:see HPI PUL:see HPI GI:no diarrhea constipation or melena, no indigestion GU:no hematuria, no dysuria MS:no joint pain, no claudication Neuro:no syncope, no lightheadedness Endo:no  diabetes, no thyroid disease  Wt Readings from Last 3 Encounters:  08/14/16 227 lb 3.2 oz (103.1 kg)  08/01/16 226 lb 2 oz (102.6 kg)  03/27/16 227 lb (103 kg)     PHYSICAL EXAM: VS:  BP 140/68 (BP Location: Left Arm, Patient Position: Sitting, Cuff Size: Large)   Pulse 70   Ht 6\' 2"  (1.88 m)   Wt 227 lb 3.2 oz (103.1 kg)   SpO2 98%   BMI 29.17 kg/m  , BMI Body mass index is 29.17 kg/m. General:Pleasant affect, NAD Skin:Warm and dry, brisk capillary refill HEENT:normocephalic, sclera clear, mucus membranes moist Neck:supple, no JVD, post R CEA  Heart:S1S2 RRR without murmur, gallup, rub or click Lungs:clear without rales, rhonchi, or wheezes [id number], non tender, + BS, do not palpate liver spleen or masses Ext:no lower ext edema, 2+ pedal pulses, 2+ radial pulses Neuro:alert and oriented X 3, MAE, follows commands, + facial symmetry Poison ivy inside left arm    EKG:   08/19/15   SR no acute changes from 2015. 08/14/16  SR rate 70 nonspecific ST changes ? Old IMI  Q 3,F    Recent Labs: 07/27/2016: ALT 21; BUN 16; Creatinine, Ser 1.13; Hemoglobin 15.9; Platelets 179.0; Potassium 4.0; Sodium 139; TSH 2.21    Lipid Panel    Component Value Date/Time   CHOL 122 07/27/2016 0718   TRIG 188.0 (H) 07/27/2016 0718   HDL 28.50 (L) 07/27/2016 0718   CHOLHDL 4 07/27/2016 0718   VLDL 37.6 07/27/2016 0718   LDLCALC 56 07/27/2016 0718       Other studies Reviewed: Additional studies/ records that were reviewed today include: previous notes, nuc study.   ASSESSMENT AND PLAN:  1.  CAD s/p CABG 2005, no angina, neg nuc study for ischemia August 2016 stable continue medical Rx  2. Carotid:  Post right CEA with plaque and no stenosis on left by duplex 02/2016 f/u Dr Donnetta Hutching  3.  HTN  Controlled  4. Hx TIA on ASA.   5. Hyperlipidemia stable on statin. Last LDL <70  6. Poison ivy;  Encouraged him to use calamine lotion and script for prednisone called in    [employer]

## 2016-08-14 ENCOUNTER — Encounter (INDEPENDENT_AMBULATORY_CARE_PROVIDER_SITE_OTHER): Payer: Self-pay

## 2016-08-14 ENCOUNTER — Ambulatory Visit (INDEPENDENT_AMBULATORY_CARE_PROVIDER_SITE_OTHER): Payer: Medicare Other | Admitting: Cardiovascular Disease

## 2016-08-14 ENCOUNTER — Encounter: Payer: Self-pay | Admitting: Cardiovascular Disease

## 2016-08-14 VITALS — BP 140/68 | HR 70 | Ht 74.0 in | Wt 227.2 lb

## 2016-08-14 DIAGNOSIS — I2510 Atherosclerotic heart disease of native coronary artery without angina pectoris: Secondary | ICD-10-CM | POA: Diagnosis not present

## 2016-08-14 NOTE — Patient Instructions (Addendum)
Medication Instructions:  Your physician has recommended you make the following change in your medication:  1-Prednisone 20 mg (2 tablets) by mouth daily for three days                        10 mg (1 tablet) by mouth daily for 4 days.  Labwork: NONE  Testing/Procedures: NONE  Follow-Up: Your physician wants you to follow-up in: 12 months with Dr. Johnsie Cancel. You will receive a reminder letter in the mail two months in advance. If you don't receive a letter, please call our office to schedule the follow-up appointment.   If you need a refill on your cardiac medications before your next appointment, please call your pharmacy.

## 2016-10-05 ENCOUNTER — Encounter: Payer: Self-pay | Admitting: Family

## 2016-10-08 ENCOUNTER — Encounter: Payer: Self-pay | Admitting: Family

## 2016-10-09 ENCOUNTER — Ambulatory Visit (INDEPENDENT_AMBULATORY_CARE_PROVIDER_SITE_OTHER): Payer: Medicare Other | Admitting: Family

## 2016-10-09 ENCOUNTER — Ambulatory Visit (HOSPITAL_COMMUNITY)
Admission: RE | Admit: 2016-10-09 | Discharge: 2016-10-09 | Disposition: A | Discharge status: Home / Self Care | Payer: Medicare Other | Source: Ambulatory Visit | Attending: Vascular Surgery | Admitting: Vascular Surgery

## 2016-10-09 ENCOUNTER — Encounter: Payer: Self-pay | Admitting: Family

## 2016-10-09 VITALS — BP 144/73 | HR 69 | Temp 97.6°F | Resp 16 | Ht 74.0 in | Wt 229.0 lb

## 2016-10-09 DIAGNOSIS — Z9889 Other specified postprocedural states: Secondary | ICD-10-CM

## 2016-10-09 DIAGNOSIS — I6521 Occlusion and stenosis of right carotid artery: Secondary | ICD-10-CM

## 2016-10-09 DIAGNOSIS — Z87891 Personal history of nicotine dependence: Secondary | ICD-10-CM

## 2016-10-09 DIAGNOSIS — I6523 Occlusion and stenosis of bilateral carotid arteries: Secondary | ICD-10-CM | POA: Diagnosis not present

## 2016-10-09 LAB — VAS US CAROTID
LEFT ECA DIAS: -10 cm/s
Left CCA dist dias: 13 cm/s
Left CCA dist sys: 105 cm/s
Left CCA prox dias: 8 cm/s
Left CCA prox sys: 76 cm/s
Left ICA dist dias: -18 cm/s
Left ICA dist sys: -75 cm/s
Left ICA prox dias: -16 cm/s
Left ICA prox sys: -140 cm/s
RIGHT CCA MID DIAS: 12 cm/s
RIGHT ECA DIAS: -11 cm/s
Right CCA prox dias: 7 cm/s
Right CCA prox sys: 59 cm/s
Right cca dist sys: -99 cm/s

## 2016-10-09 NOTE — Progress Notes (Signed)
Chief Complaint: Follow up Extracranial Carotid Artery Stenosis   History of Present Illness  [name] [name] is a 75 y.o. malepatient of Dr. Donnetta Hutching who is s/p right CEA on 08/26/15.  He had a TIA about 2011 with no critical stenosis at that time. He did develop severe asymptomatic disease and underwent uneventful right carotid endarterectomy in August 2016. He is here today for ultrasound follow-up. He reports no new medical difficulties. Specifically denies any neurologic deficits.   In June of 2011 or 2012 he experienced mild left facial drooping and numbness that lasted 3-4 minutes, was evaluated at [facility] ED, states CT of his head did not show a stroke; states he has had not had any further TIA or stroke symptoms.  He states that his blood pressure usually runs 125-135/65-70.  He admits to not getting enough exercise.   He denies claudication symptoms.  The patient denies amaurosis fugax or monocular blindness. Pt. denies hemiplegia. The patient denies receptive or expressive aphasia. Pt. denies extremity weakness.  Patient denies New Medical or Surgical History.  He reports his granddaughter is going through some medical issues which is a source of stress for him.  He had a 4 vessel CABG in 2005. He sees Dr. Johnsie Cancel yearly and he is due to see him this month, states he needs to make an appointment. I advised him to make an appointment for ASAP before his right CEA.   Pt Diabetic: No, Review of records: 6.4 A1C in July 2017, but states he does not have DM  Pt smoker: non-smoker, he occasionally uses smokeless tobacco   Pt meds include:  Statin : Yes  ASA: Yes  Other anticoagulants/antiplatelets: Plavix    Past Medical History:  Diagnosis Date  . Carotid artery occlusion   . Cerebrovascular disease   . Coronary artery disease    s/p CABG 2005; sees Dr Johnsie Cancel yearly  . Gynecomastia   . Hypercholesterolemia   . Hypertension   . Keloid   . Neurodermatitis    . Overweight(278.02)   . Personal history of colonic polyps 10/08/2006   tubular adenomas  . Renal insufficiency   . Shoulder pain   . Transient ischemic attack 2006   "lasted ~ 5 seconds"  . Vitamin D deficiency     Social History Social History  Substance Use Topics  . Smoking status: Former Research scientist (life sciences)  . Smokeless tobacco: Current User    Types: Snuff     Comment: uses dip 3-4 times per week  . Alcohol use No    Family History Family History  Problem Relation Age of Onset  . Heart disease Mother     Before age 51  . Diabetes Mother   . Kidney disease Mother   . Heart attack Mother   . Cancer Father     Lung  . Diabetes Brother   . Heart disease Brother   . Diabetes Sister   . Lung cancer Paternal Uncle     questionable as to if it was lung ca  . Colon cancer Neg Hx     Surgical History Past Surgical History:  Procedure Laterality Date  . CARDIAC CATHETERIZATION  02/2004   "tried to stent; couldn't"  . CAROTID ENDARTERECTOMY Right 08/26/2015  . CORONARY ANGIOPLASTY    . CORONARY ARTERY BYPASS GRAFT  Feb. 2005   4 vessel  . ENDARTERECTOMY Right 08/26/2015   Procedure: Right Carotid ENDARTERECTOMY with Patch Angioplasty ;  Surgeon: Rosetta Posner, MD;  Location: [facility];  Service: Vascular;  Laterality: Right;  . 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 Dessie Coma  . KELOID EXCISION    . PILONIDAL CYST EXCISION  1989    Allergies  Allergen Reactions  . Niacin Other (See Comments)    REACTION: intol to NIACIN w/ headaches    Current Outpatient Prescriptions  Medication Sig Dispense Refill  . aspirin 81 MG tablet Take 81 mg by mouth daily.      [name] atorvastatin (LIPITOR) 20 MG tablet Take 0.5 tablets (10 mg total) by mouth daily. Take as directed 30 tablet 11  . Cholecalciferol (VITAMIN D) 2000 UNITS CAPS Take 1 capsule by mouth daily.     . clopidogrel (PLAVIX) 75 MG tablet Take 1 tablet (75 mg total) by mouth daily. 90 tablet 1  . hydrochlorothiazide  (MICROZIDE) 12.5 MG capsule TAKE 1 CAPSULE (12.5 MG TOTAL) BY MOUTH DAILY. 30 capsule 11  . nitroGLYCERIN (NITROSTAT) 0.4 MG SL tablet Place 1 tablet (0.4 mg total) under the tongue every 5 (five) minutes as needed for chest pain. 25 tablet 3  . valsartan (DIOVAN) 160 MG tablet TAKE 1 TABLET BY MOUTH DAILY 90 tablet 1  . predniSONE (DELTASONE) 10 MG tablet      No current facility-administered medications for this visit.     Review of Systems : See HPI for pertinent positives and negatives.  Physical Examination  Vitals:   10/09/16 1003 10/09/16 1004 10/09/16 1006 10/09/16 1009  BP: (!) 147/70 (!) 143/69 (!) 164/82 (!) 144/73  Pulse: 69 69 69 69  Resp: 16     Temp: 97.6 F (36.4 C)     SpO2: 97%     Weight: 229 lb (103.9 kg)     Height: 6\' 2"  (1.88 m)      Body mass index is 29.4 kg/m.  General: WDWN male in NAD  GAIT: normal  Eyes: PERRLA  Pulmonary: Respirations are non labored, CTAB, good air movement in all fields.  Cardiac: regular rhythm and rate, no detected murmur.  VASCULAR EXAM  Carotid Bruits  Left  Right    Negative  Negative   Aorta is not palpable Radial pulses are faintly palpable right and  2+ palpable left   LE Pulses  LEFT  RIGHT   POPLITEAL  Not palpable  Not palpable   POSTERIOR TIBIAL  palpable  palpable   DORSALIS PEDIS  ANTERIOR TIBIAL  not palpable  not palpable    Gastrointestinal: soft, nontender, BS WNL, no r/g, no palpable masses.  Musculoskeletal: No muscle atrophy/wasting. M/S 5/5 throughout, Extremities without ischemic changes.  Neurologic: A&O X 3; Appropriate Affect, Speech is normal  CN 2-12 intact, Pain and light touch intact in extremities, Motor exam as listed above.   Assessment: [name] [name] is a 75 y.o. male who is s/p right carotid endarterectomy in August 2016. In June of 2011 or 2012 experienced mild left facial drooping and numbness that lasted 3-4 minutes, was  evaluated at [facility] ED, states CT of his head did not show a stroke; reports he has had not had any further TIA or stroke symptoms. His atherosclerotic risk factors include pre-diabetes, occasional use of smokeless tobacco, CAD with history of 4 vessel CABG, dyslipidemia, and hypertension.     DATA Today's carotid Duplex suggests a patent right CEA site with no evidence or restenosis.  Evidence of <40% (high end of range) stenosis of the left proximal internal carotid artery. No significant stenosis of the bilateral ECA or  CCA.  Both vertebral arteries are antegrade, both subclavian arteries are multiphasic (normal).  Right CEA since the carotid duplex on 03/20/16, left ICA stenosis remains stable.   Plan:  The patient was counseled re cessation of smokeless tobacco use and given several free resources re this.   Follow-up in 1 year with Carotid Duplex scan.   I discussed in depth with the patient the nature of atherosclerosis, and emphasized the importance of maximal medical management including strict control of blood pressure, blood glucose, and lipid levels, obtaining regular exercise, and cessation of smokeless tobacco use.  The patient is aware that without maximal medical management the underlying atherosclerotic disease process will progress, limiting the benefit of any interventions. The patient was given information about stroke prevention and what symptoms should prompt the patient to seek immediate medical care. Thank you for allowing [place] to participate in this patient's care.  Clemon Chambers, RN, MSN, FNP-C [facility] Office: [phone]  Clinic Physician: Early  10/09/16 10:23 AM

## 2016-10-09 NOTE — Progress Notes (Signed)
Vitals:   10/09/16 1003 10/09/16 1004 10/09/16 1006  BP: (!) 147/70 (!) 143/69 (!) 164/82  Pulse: 69 69 69  Resp: 16    Temp: 97.6 F (36.4 C)    SpO2: 97%    Weight: 229 lb (103.9 kg)    Height: 6\' 2"  (1.88 m)

## 2016-10-09 NOTE — Patient Instructions (Signed)
Stroke Prevention Some medical conditions and behaviors are associated with an increased chance of having a stroke. You may prevent a stroke by making healthy choices and managing medical conditions. HOW CAN I REDUCE MY RISK OF HAVING A STROKE?   Stay physically active. Get at least 30 minutes of activity on most or all days.  Do not smoke. It may also be helpful to avoid exposure to secondhand smoke.  Limit alcohol use. Moderate alcohol use is considered to be:  No more than 2 drinks per day for men.  No more than 1 drink per day for nonpregnant women.  Eat healthy foods. This involves:  Eating 5 or more servings of fruits and vegetables a day.  Making dietary changes that address high blood pressure (hypertension), high cholesterol, diabetes, or obesity.  Manage your cholesterol levels.  Making food choices that are high in fiber and low in saturated fat, trans fat, and cholesterol may control cholesterol levels.  Take any prescribed medicines to control cholesterol as directed by your health care provider.  Manage your diabetes.  Controlling your carbohydrate and sugar intake is recommended to manage diabetes.  Take any prescribed medicines to control diabetes as directed by your health care provider.  Control your hypertension.  Making food choices that are low in salt (sodium), saturated fat, trans fat, and cholesterol is recommended to manage hypertension.  Ask your health care provider if you need treatment to lower your blood pressure. Take any prescribed medicines to control hypertension as directed by your health care provider.  If you are 18-39 years of age, have your blood pressure checked every 3-5 years. If you are 40 years of age or older, have your blood pressure checked every year.  Maintain a healthy weight.  Reducing calorie intake and making food choices that are low in sodium, saturated fat, trans fat, and cholesterol are recommended to manage  weight.  Stop drug abuse.  Avoid taking birth control pills.  Talk to your health care provider about the risks of taking birth control pills if you are over 35 years old, smoke, get migraines, or have ever had a blood clot.  Get evaluated for sleep disorders (sleep apnea).  Talk to your health care provider about getting a sleep evaluation if you snore a lot or have excessive sleepiness.  Take medicines only as directed by your health care provider.  For some people, aspirin or blood thinners (anticoagulants) are helpful in reducing the risk of forming abnormal blood clots that can lead to stroke. If you have the irregular heart rhythm of atrial fibrillation, you should be on a blood thinner unless there is a good reason you cannot take them.  Understand all your medicine instructions.  Make sure that other conditions (such as anemia or atherosclerosis) are addressed. SEEK IMMEDIATE MEDICAL CARE IF:   You have sudden weakness or numbness of the face, arm, or leg, especially on one side of the body.  Your face or eyelid droops to one side.  You have sudden confusion.  You have trouble speaking (aphasia) or understanding.  You have sudden trouble seeing in one or both eyes.  You have sudden trouble walking.  You have dizziness.  You have a loss of balance or coordination.  You have a sudden, severe headache with no known cause.  You have new chest pain or an irregular heartbeat. Any of these symptoms may represent a serious problem that is an emergency. Do not wait to see if the symptoms will   go away. Get medical help at once. Call your local emergency services (911 in [place] Do not drive yourself to the hospital.   This information is not intended to replace advice given to you by your health care provider. Make sure you discuss any questions you have with your health care provider.   Document Released: 01/24/2005 Document Revised: 01/07/2015 Document Reviewed:  06/19/2013 Elsevier Interactive Patient Education 2016 Elsevier Inc.  

## 2016-11-19 NOTE — Addendum Note (Signed)
Addended by: Lianne Cure A on: 11/19/2016 08:34 AM   Modules accepted: Orders

## 2016-11-21 ENCOUNTER — Other Ambulatory Visit: Payer: Self-pay

## 2016-11-21 MED ORDER — ATORVASTATIN CALCIUM 20 MG PO TABS: 10.0000 mg | ORAL_TABLET | Freq: Every day | ORAL | 1 refills | Status: DC

## 2017-01-03 ENCOUNTER — Other Ambulatory Visit: Payer: Self-pay | Admitting: Cardiovascular Disease

## 2017-01-14 ENCOUNTER — Other Ambulatory Visit: Payer: Self-pay | Admitting: Pulmonary Disease

## 2017-02-04 ENCOUNTER — Ambulatory Visit: Payer: Medicare Other | Admitting: Pulmonary Disease

## 2017-02-13 ENCOUNTER — Ambulatory Visit (INDEPENDENT_AMBULATORY_CARE_PROVIDER_SITE_OTHER): Payer: Medicare Other | Admitting: Pulmonary Disease

## 2017-02-13 VITALS — BP 128/72 | HR 76 | Temp 97.3°F | Ht 73.0 in | Wt 218.1 lb

## 2017-02-13 DIAGNOSIS — I2510 Atherosclerotic heart disease of native coronary artery without angina pectoris: Secondary | ICD-10-CM

## 2017-02-13 DIAGNOSIS — I872 Venous insufficiency (chronic) (peripheral): Secondary | ICD-10-CM

## 2017-02-13 DIAGNOSIS — E559 Vitamin D deficiency, unspecified: Secondary | ICD-10-CM

## 2017-02-13 DIAGNOSIS — E782 Mixed hyperlipidemia: Secondary | ICD-10-CM | POA: Diagnosis not present

## 2017-02-13 DIAGNOSIS — E663 Overweight: Secondary | ICD-10-CM | POA: Diagnosis not present

## 2017-02-13 DIAGNOSIS — Z9889 Other specified postprocedural states: Secondary | ICD-10-CM | POA: Diagnosis not present

## 2017-02-13 DIAGNOSIS — I10 Essential (primary) hypertension: Secondary | ICD-10-CM | POA: Diagnosis not present

## 2017-02-13 DIAGNOSIS — R7301 Impaired fasting glucose: Secondary | ICD-10-CM | POA: Diagnosis not present

## 2017-02-13 DIAGNOSIS — I779 Disorder of arteries and arterioles, unspecified: Secondary | ICD-10-CM

## 2017-02-13 DIAGNOSIS — I739 Peripheral vascular disease, unspecified: Secondary | ICD-10-CM

## 2017-02-13 DIAGNOSIS — Z951 Presence of aortocoronary bypass graft: Secondary | ICD-10-CM | POA: Diagnosis not present

## 2017-02-13 NOTE — Patient Instructions (Signed)
Today we updated your med list in our EPIC system...    Continue your current medications the same...  We reviewed your problem list, meds, previous lab work, etc...  Continue everything the same + Exercise/ diet/ wt reduction...  Call for any questions...  Let's plan a follow up visit in 61mo, sooner if needed for problems.Marland KitchenMarland Kitchen

## 2017-02-14 ENCOUNTER — Encounter: Payer: Self-pay | Admitting: Pulmonary Disease

## 2017-02-14 NOTE — Progress Notes (Signed)
Subjective:    Patient ID: [name] [name], male    DOB: [birth year], 76 y.o.   MRN: [id number]  HPI 76 y/o WM here for a follow up visit... he has multiple medical problems as noted below...   SEE PREV EPIC NOTES FOR EARLIER DATA>>   LABS 6/14:  FLP- on Lip10 w/ TChol/LDL ok but TG/HDL still off;  Chems- ok x BS=136, A1c=6.4;  CBC- wnl;  TSH=2.66;  PSA=1.65;  CRP done at pt request=0.5....  CXR 7/15 showed cardiomeg, no change in contour of mediastinum compared to mult old films, clear lungs, NAD...   CDopplers 7/15 showed 60-79% RICAstenosis & [id number] LICAstenosis (no change)...   LABS 7/15:  FLP- TChol&LDL ok on Lip10 but TG incr & HDL too low;  Chems- ok w/ BS=115A1c=6.2;  CBC- wnl;  TSH=2.51;  PSA=1.78...   ~  January 28, 2015:  59mo ROV & [name] [name] reports a good interval- no new medical complaints or concerns; he has however been under a lot of stress regarding his 21y/o grand daughter (Hx obesity >300#, subseq gastric bypass in [place], now Dx w/ anorexia, & she was the victim of a sexual assault); he has also had 4 friends pass away over the last month, mult funerals etc;  We discussed these issues 7 offered anxiolytic but he declines 7 he seems o be handling it all as well as can be expected, he will call if he feels he needs Rx...     [name] on Diovan160 & HCT 12.5; Norvasc caused swelling; BP= 130/66 & feeling well w/o CP, palpit, dizzy, SOB, edema...    He saw [name] for Cards 8/15> known CAD & prev CABG 2005, normal LVF, on ASA/Plavix; stable on meds and no changes made...     [facility] Carotid stenosis 1/16> he remains on ASA/Plavix & asymptomatic w/o signs of cerebral ischemia; f/u CDoppler 1/16 (no bruits) showed stable 60-79% right ICAstenosis & left <40% ICAstenosis (f/u Q65mo).    Chol> well controlled on Lip20; he is intol to Niacin... We reviewed prob list, meds, xrays and labs> see below for updates >> Given 2015 flu vaccine today...  ~  July 29, 2015:  71mo ROV &  [name] [name] reports doing satis "I'm handling stress better these days"- continued issues w/ 64 y/o grand-daughter... We reviewed the following medical problems during today's office visit >>     [name] on Diovan160 & HCT-12.5 (intol to BBlockers in past); BP= 142/64 & he says good at home too; denies CP, palpit, SOB, edema, etc...    CAD> s/p 4 vessel CABG 2005, on ASA/ Plavix; he saw DrNishan 8/15 for yearly check up- stable & no changes made...    Cerebrovasc Dis> on ASA/ Plavix; followed by [name] & seen 1/16 w/ f/u CDopplers stable w/ 60-79% right ICA stenosis, <40% left ICA stenosis, they are checking Q20mo...    CHOL> on Lip10- taking 1/2 of 20mg  tab daily; FLP 7/16 showed TChol 129, TG 182, HDL 31, LDL 62; he is intol Niacin & rec to incr exercise program & get wt down...    Overweight> weight= 226# which is up 2#; we reviewed diet, exercise, & wt loss program...    Hx colon polyps> f/u colonoscopy 2/13 by [name]- neg, no polyps & he rec f/u in 73yrs...    GU> he reports no benefit from Sildenafil & prev requested Urology appt to discuss additional alternatives but he never went...    DJD/ Vit D defic> he uses OTC  analgesics as needed; continues on Vit D 2000u daily w/ last VitD level 6/13= 34...    Hx TIA> SEE 6/10 Hosp> sm vessel dis, remote lacunar infarcts, intracranial atherosclerotic changes on MRA; CDopplers followed by [name] as above w/ 60-79% RICAstenosis; on ASA/Plavix...    Keloid> in lower sternal scar w/ plastic repair in past; he saw Derm 2016 for removal of SK, epidermoid cyst, lipoma EXAM reveals Afeb, VSS, O2sat=97% on RA;  HEENT- neg x wax;  Vasc- bilat C bruits R>L;  Chest- clear w/o w/r/r;  Heart- RR gr1/6SEM w/o r/g;  Abd- obese, soft, neg;  Ext- +VV/VI, w/o c/c/e... We reviewed prob list, meds, xrays and labs> see below for updates >>   LABS 7/16:  FLP- at goals on Cres5;  Chems- wnl x BS=111, A1c=6.1;  CBC- wnl;  TSH=3.01;  PSA=2.14...  ~  January 30, 2016:  34moRAzusahad right CAE w/ DPA 08/26/15 by DSharlot Gowdafor a progressive stenosis & did well;  He had cardiac clearance due to his hx CAD w/ prev CABG in 2005 & norm LVF; Myoview 08/18/15 showed c/w prior MI w/ peri-infarct ischemia, EF=55-65%, felt to be a low risk study;  EKG showed NSR, rate76/min, wnl, NAD;  He remains on ASA/ Plavix;  BP controlled on Diovan160, HCT12.5, w/ BP=138/72 today & he denies HAs, CP, palpit, SOB, edema;  He is reminded about diet/ exercise/ wt reduction...     He developed some urinary retention after carotid surg 08/2015- required in & out cath x2 & no prob since...     He has DJD & c/o some left hip pain; takes OTC analgesics as needed & he will check into Ortho (DrGraves) eval soon... EXAM reveals Afeb, VSS, O2sat=97% on RA;  HEENT- neg, mallampati2; Vasc- leftt C bruit & s/p R-CAE;  Chest- clear w/o w/r/r;  Heart- RR gr1/6SEM w/o r/g;  Abd- obese, soft, diastasis;  Ext- +VV/VI, w/o c/c/ +tr edema... IMP/PLAN>>  [name] [name] stable, continue same meds, needs incr exerc & wt reduction; OK Flu shot today...  ~  August 01, 2016:  683moOV & [name] [name] doing well, no new complaints or concerns- notes some fatigue but says "I'm feeling pretty good- just under lots of stress" w/ daugh financial problems, older grandkids, bro-in-law depression & bladder Ca, etc;  He has not been exercising & has not lost weight    [name] on Diovan160 & HCT-12.5 (intol to BBlockers in past); BP= 142/64 & he says good at home too; denies CP, palpit, SOB, edema, etc...    CAD> s/p 4 vessel CABG 2005, on ASA/ Plavix; he saw DrCherly Hensen/17 for yearly check up- stable & no changes made; he had Nuclear stress test 08/2015 showing prior MI & EF=56%, low risk study...    Cerebrovasc Dis> s/p R-CAE 08/2015- on ASA/ Plavix; followed by [name] & last seen 3/17 w/ f/u CDopplers showing patent R endart site w/o ICA stenosis, and 12-31-17%ICA stenosis...    CHOL> on Lip10- taking 1/2 of 2045mab daily; FLP 7/16 showed TChol 129,  TG 182, HDL 31, LDL 62; he is intol Niacin & rec to incr exercise program & get wt down...    Overweight> weight= 226# which is stable; we reviewed diet, exercise, & wt loss program...    Hx colon polyps> last colonoscopy 2/13 by [name]- neg, no polyps & he rec f/u in 10y75yr    GU> he reports no benefit from Sildenafil & prev requested Urology appt  to discuss additional alternatives but he never went...    DJD/ Vit D defic> he uses OTC analgesics as needed; continues on Vit D 2000u daily w/ last VitD level 6/13= 34...    Hx TIA> SEE 6/10 Hosp> sm vessel dis, remote lacunar infarcts, intracranial atherosclerotic changes on MRA; CDopplers followed by [name] as above w/ 60-79% RICAstenosis; on ASA/Plavix...    Keloid> in lower sternal scar w/ plastic repair in past; he saw Derm 2016 for removal of SK, epidermoid cyst, lipoma EXAM reveals Afeb, VSS, O2sat=97% on RA;  HEENT- neg x wax;  Vasc- bilat C bruits R>L;  Chest- clear w/o w/r/r;  Heart- RR gr1/6SEM w/o r/g;  Abd- obese, soft, neg;  Ext- +VV/VI, w/o c/c/e...  LABS 07/27/16>  FLP- ok on Lip20 x TG=188, HDL=29;  Chems- wnl x BS=120, A1c=6.4 on diet alone;  CBC- wnl;  TSH=2.21;  PSA=2.28  CXR 08/01/16>  Stable heart size & post-op changes, lungs clear- NAD... IMP/PLAN>>  He is on a simple medication regimen- continue same; most important however is diet/ exercise/ wt reduction- this would help his stress level as well, discussed in detail w/ pt... Note: >50% of this 25 min ROV was spent in counseling and coordination of care...   ~  February 13, 2017:  38mo ROV & when seen 07/2016 [name] [name] was under a lot of stress but otherw ok medically- BP controlled, Cards followed by [name] & stable, Carotid stenosis followed by [name] & stable (SEE PROB LIST)...   He reports that his stress has diminished ("I learned to say 'no' to my daughter"), brother-in-law [name] [name] is doing better, but he reports wife [name] [name] fell w/ Fx tibia on left & [name] [name] had to do total  care!  No new complaints or concerns today.    HBP, CAD-s/p 4 vessel CABG 2005, cerebrovasc dis, hx TIA>  On ASA/Plavix, Diovan160, HCT12.5; BP controlled at 128/72; he denies CP/ palpit/ dizzy/ edema; he [facility] at [facility]- hx TIA w/o critical stenosis in 2011, s/p R-CAE 08/2015;  CDopplers 09/2016 showed patent R-CAE site, [id number] LICA stenosis... NOTE: [name] [name] still chews tobacco & strongly urged to stop!    Chol, overwt>  Weight down 8# to 216# today; last FLP 06/2016 was ok on Lip20-1/2 per day but TG elev & HDL low- advised low fat diet, incr exerc, get weight down...    GI/ GU>  stable EXAM reveals Afeb, VSS, O2sat=97% on RA;  HEENT- neg x wax;  Vasc- bilat C bruits R>L;  Chest- clear w/o w/r/r;  Heart- RR gr1/6SEM w/o r/g;  Abd- obese, soft, neg;  Ext- +VV/VI, w/o c/c/e... IMP/PLAN>>  [name] [name] is stable on his current regimen- he needs to quit the chewing tobacco,and he needs a Prevnar-13 vaccine...           Problem List:    ENT >>  He sawDrWolicki [id number] for cerumen, hearing loss, presbycusis- candidate for hearing aides but he is holding off...  HYPERTENSION (ICD-401.9) >> ~  6/09:  prev on Atenolol25mg - stopped due to side effects & feels much better off BBlockers. ~  2/10:  Avapro300 changed to Diovan160 per his insurance company for $$$ reasons. ~  11/11:  BP= 150/80 & remains asymptomatic, doesn't want to incr meds- discussed diet/ exerc... ~  5/12:  BP= 132/70 & denies HA, fatigue, visual changes, CP, palipit, dizziness, syncope, dyspnea, edema, etc... ~  1/13:  BP= 152/68 & he remains asymptomatic; we decided to add NORVASC 5mg /d ==> pt stopped on his  own due to swelling. ~  6/13:  He saw DrNishan for f/u & BP= 154/80 on Diovan alone; added HCTZ 12.5mg /d... ~  7/13:  BP= 148/82 here & 130s/70s at home he says on DIOVAN160mg /d & HCTZ 12.5mg /d; continue same ~  CXR 7/13 showed cardiomeg, s/p CABG, bronchitic changes at lung bases, NAD... ~  1/14: on Diovan160 & HCT-12.5 (intol to  BBlockers in past); BP= 142/80 & he says better at home; denies CP, palpit, SOB, edema, etc...  ~  7/14: on Diovan160 & HCT-12.5 (intol to BBlockers in past); BP= 140/70 & he remains asymptomatic... ~  1/15: on Diovan160 & HCT-12.5; BP= 130/60 & he says good at home too; denies CP, palpit, SOB, edema, etc ~  CXR 7/15 showed cardiomeg, no change in contour of mediastinum compared to mult old films, clear lungs, NAD ~  1/16:  on Diovan160 & HCT 12.5; Norvasc caused swelling; BP= 130/66 & feeling well w/o CP, palpit, dizzy, SOB, edema. ~  7/16: on Diovan160 & HCT-12.5 (intol to BBlockers in past); BP= 142/64 & he says even better at home; he remains asymptomatic.  CORONARY ARTERY DISEASE (ICD-414.00) - S/P 4 vessel CABG 2/05 by DrVanTrigt> on ASA/ PLAVIX & followed by [name] yearly & his notes are reviewed; he is intol to BBlocker therapy as noted... ~  1/13 & 7/13:  stable & denies angina, palpit, SOB, edema, etc; EKG showed NSR, rate71, WNL, NAD.[name].  ~  8/14: he had yearly f/u DrNishan> CAD, s/pCABG in 2005, norm LVF; EKG showed NSR, rate77, NSSTTWA, NAD; stable, rec to incr exercise & lose wt; no change in meds...  ~  8/15: He saw [name] for yearly Cards visit> known CAD & prev CABG 2005, normal LVF, on ASA/Plavix; stable on meds and no changes made  CEREBROVASCULAR DISEASE (ICD-437.9) - see above> on ASA 81mg /d, & PLAVIX 75mg /d... followed by Sharlot Gowda for [facility]. ~  6/10:  See 6/10 Hosp records & f/u by [name] in his office... ~  6/11:  seen by [name] & stable w/o TIAs or amaurosis; CDopplers were stable w/ bilat 40-59% ICA stenoses, f/u 31yr. ~  He was due for a follow up eval 6/12> we don't have records & will call for the report... ~  6/13:  He saw [name] for f/u asymptomatic carotid dis> CDoppler 6/13 showed some progression of right sided stenosis (now 60-79%) but denies cerebral ischemic symptoms (right side remains 40-59%); continue ASA/Plavix & f/u planned 33mo. ~  1/14:  f/u CDopplers were  stable w/ 60-79% right ICA stenosis & 20-39% left ICA stenosis & f/u planned in 36mo... ~  7/14:  f/u [facility]lers at [facility] showed 60-79% right ICAstenosis and 0-40% left ICAstenosis- f/u Q14mo... ~  1/15:  f/u w/ [facility] for known Carotid stenosis> CDuplex showed 60-79% right carotid stenosis and 40-59% left carotid stenosis; continue ASA/Plavix and lifestyle mod strategies... ~  7/15:  f/u w/ [facility] for known Carotid stenosis> CDuplex unchanges w/ 60-79% RICAstenosis... ~  1/16: He saw [facility] for f/u Carotid stenosis> he remains on ASA/Plavix & asymptomatic w/o signs of cerebral ischemia; f/u CDoppler 1/16 (no bruits) showed stable 60-79% right ICAstenosis & left <40% ICAstenosis (f/u Q60mo).  VENOUS INSUFFICIENCY (ICD-459.81) - he is aware of need to elim salt, elevate legs, wear support hose.  HYPERCHOLESTEROLEMIA (ICD-272.0) - controlled on LIPITOR 20- 1/2 tab daily... he has tried NIACIN in the past and refuses to take it again due to headaches. ~  FLP 5/08 showed TChol 125, TG 171, HDL 24,  LDL 67...  ~  [place] 12/08 showed TChol 107, TG 131, HDL 23, LDL 58... rec- same med, better diet & exercise. ~  FLP 2/10 on Lip10 (wt=235#) showed TChol 119, TG 158, HDL 24, LDL 63... rec- ditto ~  [place] 11/10 on Lip10 (wt=236#) showed TChol 115, TG 128, HDL 28, LDL 62 ~  FLP 5/11 on Lip10 (wt=233#) showed TChol 119, TG 144, HDL 27, LDL 63 ~  DrNishan suggested that he cut the Lipitor to 5mg /d & add Niaspan, but he refuses the Niacin preps due to HAs & wants to contin Lip10. ~  FLP 11/11 on Lip10 (wt=235#) showed TChol 119, TG 113, HDL 31, LDL 66 ~  FLP 5/12 on Lip10 (wt=229#) showed TChol 118, TG 150, HDL 28, LDL 60 ~  FLP 6/13 on Lip10 (wt=232#) showed TChol 127, TG 164, HDL 27, LDL 67... He tells me [name] wanted him in an HDL trial... ~  6/14: on Lip20- taking 1/2 tab daily; FLP 6/14 shows TChol 126, TG 155, HDL 32, LDL 63; he is intol Niacin & rec to incr exercise program & get wt down ~  FLP 7/15 on Lip10 showed TChol  118, TG 166, HDL 29, LDL 56... ~  FLP 7/16 on Lip10 showed TChol 129, TG 182, HDL 31, LDL 62  DIABETES MELLITUS, BORDERLINE (ICD-790.29) - On diet Rx alone... ~  labs in hosp 6/10 showed BS= 109-113 ~  labs 11/10 showed BS= 109, A1c= 5.9 ~  labs 5/11 showed BS= 107 ~  labs 11/11 showed BS= 108 ~  Labs 5/12 showed BS= 97, A1c= 6.0 ~  Labs 6/13 showed BS=122, A1c= 6.4... Needs better diet & wt reduction. ~  Labs 6/14 showed BS= 136, A1c= 6.4 ~  Labs 7/15 on diet alone showed BS=115, A1c= 6.2 ~  Labs 7/16 on diet alone showed BS= 111, A1c= 6.1  OVERWEIGHT (ICD-278.02) - he is 6'2" tall and weighs ~230# for a BMI of 30... we have discussed diet & exercise stategies. ~  7/15: he has lost 6# down to 224# today... ~  7/16: he has gained 2# to 226#  COLONIC POLYPS (ICD-211.3) - last colonoscopy 10/07 by [name] showed several 5-6 mm polyps... path= tubular adenomas ... f/u planned 5 years. ~  Colonoscopy 2/13 by [name] was neg- no recurrent polyps etc & he felt that 15yr f/u was appropriate...  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 Rx w/ PT.  VITAMIN D DEFIC >> on Vit D supplement 2000u daily... ~  Labs 2/10 showed Vit D level = 14... rec to start Vit D supplement OTC... ~  Labs 6/13 showed Vit D level = 34... rec to continue his VitD supplement daily...  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 ~  6/10:  Hosp w/ TIA w/ left face & arm symptoms that resolved quickly; CT showed small vessel disease;  MRI showed bilat remote lacunar infarcts, no acute infarct, +sm vessel dis;  MRA showed intracranial atherosclerotic changes, & ?focal stenosis (?70%) of left ICA in the neck; he saw [name] 06/08/09 (no bruits)  w/ CDopplers done showing bilat 40-59% carotid stenoses & agreed w/ ASA + Plavix and f/u 56yr...  ~  6/13: saw [name] for f/u asymptomatic carotid dis> CDoppler  6/13 showed some progression of right sided stenosis (now 60-79%) but denies cerebral ischemic symptoms (right side remains 40-59%); continue ASA/Plavix & f/u planned 72mo. ~  1/14: he continues Q62mo f/u w/ [facility]- stable w/o cerebral ischemic symptoms => CDopplers are stable (see above).  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 he had plastic surg by [facility] 5/09 w/ improved scar. ~  He has seb cyst over CSpine & he tells me that DrTafeen is going to remove this soon... ~  He has a small knot/lesion ?cyst over the fibular head on lat side of left lower leg; stable, no discomfort, no change & offered Ortho eval but he prefers to just watch it for now... ~  2016> he saw Derm for removal of  SK, epidermoid cyst, lipoma...  HEALTH MAINTENANCE: ~  GI:  followed by [name] & last colonoscopy was 10/07 w/ f/u planned 62yrs. ~  GU:  he is up to date on DRE & PSA checks here (PSA remains in the 1-2 range). ~  Immunizations:  he had Winn in 2000 w/ repeat PNEUMOVAX 11/10 & TDAP 5/11... he gets the yearly Seasonal Flu vaccine each fall...   Past Surgical History:  Procedure Laterality Date  . CARDIAC CATHETERIZATION  02/2004   "tried to stent; couldn't"  . CAROTID ENDARTERECTOMY Right 08/26/2015  . CORONARY ANGIOPLASTY    . CORONARY ARTERY BYPASS GRAFT  Feb. 2005   4 vessel  . ENDARTERECTOMY Right 08/26/2015   Procedure: Right Carotid ENDARTERECTOMY with Patch Angioplasty ;  Surgeon: Rosetta Posner, MD;  Location: [facility];  Service: Vascular;  Laterality: Right;  . 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 Dessie Coma  . KELOID EXCISION    . PILONIDAL CYST EXCISION  1989    Outpatient Encounter Prescriptions as of 02/13/2017  Medication Sig Dispense Refill  . aspirin 81 MG tablet Take 81 mg by mouth daily.      [name]  atorvastatin (LIPITOR) 20 MG tablet Take 0.5 tablets (10 mg total) by mouth daily. Take as directed 90 tablet 1  . Cholecalciferol (VITAMIN D) 2000 UNITS CAPS Take 1 capsule by mouth daily.     . clopidogrel (PLAVIX) 75 MG tablet TAKE 1 TABLET BY MOUTH EVERY DAY 90 tablet 1  . hydrochlorothiazide (MICROZIDE) 12.5 MG capsule TAKE 1 CAPSULE (12.5 MG TOTAL) BY MOUTH DAILY. 30 capsule 11  . nitroGLYCERIN (NITROSTAT) 0.4 MG SL tablet Place 1 tablet (0.4 mg total) under the tongue every 5 (five) minutes as needed for chest pain. 25 tablet 3  . valsartan (DIOVAN) 160 MG tablet TAKE 1 TABLET BY MOUTH DAILY 90 tablet 1  . [DISCONTINUED] atorvastatin (LIPITOR) 20 MG tablet TAKE 1 TABLET BY MOUTH EVERY DAY (Patient not taking: Reported on 02/13/2017) 30 tablet 4  . [DISCONTINUED] predniSONE (DELTASONE) 10 MG tablet      No facility-administered encounter medications on file as of 02/13/2017.     Allergies  Allergen Reactions  . Niacin Other (See Comments)    REACTION: intol to NIACIN w/ headaches    Current Medications, Allergies, Past Medical History, Past Surgical History, Family History, and Social History were reviewed in [employer] record.    Review of Systems         See HPI - all other systems neg except as noted... The patient denies anorexia, fever, weight loss, weight gain, vision loss, decreased hearing, hoarseness, chest pain, syncope, dyspnea on exertion, peripheral edema, prolonged cough, headaches, hemoptysis, abdominal  pain, melena, hematochezia, severe indigestion/heartburn, hematuria, incontinence, muscle weakness, suspicious skin lesions, transient blindness, difficulty walking, depression, unusual weight change, abnormal bleeding, enlarged lymph nodes, and angioedema.     Objective:   Physical Exam    WD, Overweight, 76 y/o WM in NAD... Vital Signs:  Reviewed> GENERAL:  Alert & oriented; pleasant & cooperative. HEENT:  [place]/AT, EOM-wnl, PERRLA,  Fundi-benign, EACs-clear, TMs-wnl, NOSE-clear, THROAT-clear & wnl. NECK:  Supple w/ fairROM; no JVD; normal carotid impulses w/o bruits; no thyromegaly or nodules palpated; no lymphadenopathy. CHEST:  Clear to P & A; without wheezes/ rales/ or rhonchi. HEART:  Median sternotomy scar w/ improved keloid, regular rhythm; without murmurs/ rubs/ or gallops. ABDOMEN:  Soft & nontender; normal bowel sounds; no organomegaly or masses detected. 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 EXT: without deformities, mild arthritic changes; + venous insuffic changes, no edema. NEURO:  CN's intact; motor testing normal; sensory testing normal; gait normal & balance OK. DERM:  Keloid as noted, no other lesions...  RADIOLOGY DATA:  Reviewed in the EPIC EMR & discussed w/ the patient...  LABORATORY DATA:  Reviewed in the EPIC EMR & discussed w/ the patient...   Assessment & Plan:    [name]  On Diovan160 & HCT 12.5 w/ improved/stable BP; needs to lose wt & elim sodium...  CAD, s/p CABG 2005> followed by [name] for Cards, stable & doing satis...  CEREBROVASC DIS/ TIA>  No cerebral ischemic symptoms on the ASA/ Plavix; he had right CEA 8/16 by [name]...  CHOL>  On Lip10 & stable; needs better low fat diet & get wt down...  Borderline DM/ IFG> adeq control w/ diet alone but he understands the importance of wt reduction.  GI>  He had colonoscopy f/u 2/13 & it was neg...  Other medical problems as noted... 08/01/16>   He is on a simple medication regimen- continue same; most important however is diet/ exercise/ wt reduction- this would help his stress level as well, discussed in detail w/ pt...    Patient's Medications  New Prescriptions   No medications on file  Previous Medications   ASPIRIN 81 MG TABLET    Take 81 mg by mouth daily.     ATORVASTATIN (LIPITOR) 20 MG TABLET    Take 0.5 tablets (10 mg total) by mouth daily. Take as directed   CHOLECALCIFEROL (VITAMIN D) 2000 UNITS CAPS    Take 1  capsule by mouth daily.    CLOPIDOGREL (PLAVIX) 75 MG TABLET    TAKE 1 TABLET BY MOUTH EVERY DAY   HYDROCHLOROTHIAZIDE (MICROZIDE) 12.5 MG CAPSULE    TAKE 1 CAPSULE (12.5 MG TOTAL) BY MOUTH DAILY.   NITROGLYCERIN (NITROSTAT) 0.4 MG SL TABLET    Place 1 tablet (0.4 mg total) under the tongue every 5 (five) minutes as needed for chest pain.   VALSARTAN (DIOVAN) 160 MG TABLET    TAKE 1 TABLET BY MOUTH DAILY  Modified Medications   No medications on file  Discontinued Medications   ATORVASTATIN (LIPITOR) 20 MG TABLET    TAKE 1 TABLET BY MOUTH EVERY DAY   PREDNISONE (DELTASONE) 10 MG TABLET

## 2017-02-18 ENCOUNTER — Other Ambulatory Visit: Payer: Self-pay | Admitting: Cardiovascular Disease

## 2017-03-28 ENCOUNTER — Encounter: Payer: Self-pay | Admitting: Podiatry

## 2017-03-28 ENCOUNTER — Ambulatory Visit (INDEPENDENT_AMBULATORY_CARE_PROVIDER_SITE_OTHER): Payer: Medicare Other | Admitting: Podiatry

## 2017-03-28 VITALS — BP 145/78 | HR 85 | Resp 16 | Ht 74.0 in | Wt 215.0 lb

## 2017-03-28 DIAGNOSIS — L600 Ingrowing nail: Secondary | ICD-10-CM | POA: Diagnosis not present

## 2017-03-28 NOTE — Patient Instructions (Signed)

## 2017-03-28 NOTE — Progress Notes (Signed)
Subjective:     Patient ID: [name] [name], male   DOB: 1941-02-25, 76 y.o.   MRN: [id number]  HPI patient states the third digit on my left foot has been quite sore and it's making it hard for me to wear shoe gear comfortably. States that he's tried to trim it and soak it without relief   Review of Systems  All other systems reviewed and are negative.      Objective:   Physical Exam  Constitutional: He is oriented to person, place, and time.  Cardiovascular: Intact distal pulses.   Musculoskeletal: Normal range of motion.  Neurological: He is oriented to person, place, and time.  Skin: Skin is warm.  Nursing note and vitals reviewed.  Neurovascular status intact muscle strength adequate range of motion within normal limits with patient found have incurvated third nail lateral border that's painful when pressed and making shoe gear difficult. Patient's found have good digital perfusion and is well oriented 3    Assessment:     Ingrowing toenail deformity third left lateral border with pain    Plan:     H&P condition reviewed and recommended treatment. At this point I have recommended removal of the nail corner and explained procedure and risk and infiltrated the left third toe 60 Milligan times like Marcaine mixture remove the lateral border exposed matrix and applied phenol 3 applications 30 seconds followed by alcohol lavage and sterile dressing. Gave instructions on soaks and reappoint

## 2017-03-28 NOTE — Progress Notes (Signed)
   Subjective:    Patient ID: [name] [name], male    DOB: 10/14/1941, 76 y.o.   MRN: [id number]  HPI Chief Complaint  Patient presents with  . Nail Problem    Bilateral; nail discoloration & thickened nail; Left foot-3rd toe-lateral side; pt stated, "Not sure if they have an ingrown toenail-wants to have checked:; x2 months      Review of Systems  All other systems reviewed and are negative.      Objective:   Physical Exam        Assessment & Plan:

## 2017-04-11 ENCOUNTER — Encounter: Payer: Self-pay | Admitting: Cardiology

## 2017-07-14 ENCOUNTER — Other Ambulatory Visit: Payer: Self-pay | Admitting: Cardiovascular Disease

## 2017-07-16 ENCOUNTER — Telehealth: Payer: Self-pay

## 2017-07-16 ENCOUNTER — Other Ambulatory Visit: Payer: Self-pay

## 2017-07-16 MED ORDER — VALSARTAN 80 MG PO TABS
160.0000 mg | ORAL_TABLET | Freq: Every day | ORAL | 0 refills | Status: DC
Start: 2017-07-16 — End: 2017-08-15

## 2017-07-16 NOTE — Telephone Encounter (Signed)
Called patient back. Informed patient that he would need to call his pharmacy about his Valsartan, to make sure it is not part of the recall. Patient verbalized understanding and will follow-up with Dr. Johnsie Cancel in September.

## 2017-07-16 NOTE — Telephone Encounter (Signed)
[facility] requesting Valsartan 80mg  tabs because Valsartan 160mg  tabs is unavailable, so Rx was sent in for Valsartan 80mg  2 tabs daily, confirmation received.

## 2017-07-16 NOTE — Telephone Encounter (Signed)
Called pt to inform him per [facility] that pt's medication of Valsartan 160 mg tablet was unavailable and on back order and that the pharmacy has valsartan 80 mg tablet. I sent in Valsartan 80 mg tablet, pt taking 2 tablets to equal 160 mg tablet. 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 Pt has some concerns. Pt would like a call back, concerning this matter.

## 2017-07-20 ENCOUNTER — Other Ambulatory Visit: Payer: Self-pay | Admitting: Pulmonary Disease

## 2017-08-05 ENCOUNTER — Telehealth: Payer: Self-pay | Admitting: Pulmonary Disease

## 2017-08-05 DIAGNOSIS — I739 Peripheral vascular disease, unspecified: Secondary | ICD-10-CM

## 2017-08-05 DIAGNOSIS — N320 Bladder-neck obstruction: Secondary | ICD-10-CM

## 2017-08-05 DIAGNOSIS — I10 Essential (primary) hypertension: Secondary | ICD-10-CM

## 2017-08-05 DIAGNOSIS — R7301 Impaired fasting glucose: Secondary | ICD-10-CM

## 2017-08-05 DIAGNOSIS — F419 Anxiety disorder, unspecified: Secondary | ICD-10-CM

## 2017-08-05 DIAGNOSIS — I779 Disorder of arteries and arterioles, unspecified: Secondary | ICD-10-CM

## 2017-08-05 NOTE — Telephone Encounter (Signed)
Pt has upcoming appt on 08/13/17 with Dr Lenna Gilford Requesting labs to be done prior to the visit.  Fasting labs and a CRP?  Please advise Dr Lenna Gilford. Thanks.

## 2017-08-05 NOTE — Telephone Encounter (Signed)
LM x 1 

## 2017-08-06 NOTE — Telephone Encounter (Signed)
Called and spoke with pt and he is aware of lab orders that have been placed in the computer.  Pt requested that the crp be ordered as well.  Nothing further is needed and pt stated that he will come in on Thursday to have these drawn.

## 2017-08-08 ENCOUNTER — Other Ambulatory Visit (INDEPENDENT_AMBULATORY_CARE_PROVIDER_SITE_OTHER): Payer: Medicare Other

## 2017-08-08 DIAGNOSIS — N320 Bladder-neck obstruction: Secondary | ICD-10-CM

## 2017-08-08 DIAGNOSIS — I10 Essential (primary) hypertension: Secondary | ICD-10-CM

## 2017-08-08 DIAGNOSIS — I779 Disorder of arteries and arterioles, unspecified: Secondary | ICD-10-CM

## 2017-08-08 DIAGNOSIS — R7301 Impaired fasting glucose: Secondary | ICD-10-CM | POA: Diagnosis not present

## 2017-08-08 DIAGNOSIS — F419 Anxiety disorder, unspecified: Secondary | ICD-10-CM | POA: Diagnosis not present

## 2017-08-08 DIAGNOSIS — I739 Peripheral vascular disease, unspecified: Secondary | ICD-10-CM

## 2017-08-08 LAB — COMPREHENSIVE METABOLIC PANEL
ALT: 21 U/L (ref 0–53)
AST: 21 U/L (ref 0–37)
Albumin: 4.3 g/dL (ref 3.5–5.2)
Alkaline Phosphatase: 76 U/L (ref 39–117)
BUN: 16 mg/dL (ref 6–23)
CO2: 32 mEq/L (ref 19–32)
Calcium: 9.1 mg/dL (ref 8.4–10.5)
Chloride: 100 mEq/L (ref 96–112)
Creatinine, Ser: 1.16 mg/dL (ref 0.40–1.50)
GFR: 65.46 mL/min (ref >60.00)
Glucose, Bld: 115 mg/dL — ABNORMAL HIGH (ref 70–99)
Potassium: 4 mEq/L (ref 3.5–5.1)
Sodium: 140 mEq/L (ref 135–145)
Total Bilirubin: 1.1 mg/dL (ref 0.2–1.2)
Total Protein: 6.7 g/dL (ref 6.0–8.3)

## 2017-08-08 LAB — CBC WITH DIFFERENTIAL/PLATELET
Basophils Absolute: 0 10*3/uL (ref 0.0–0.1)
Basophils Relative: 0.4 % (ref 0.0–3.0)
Eosinophils Absolute: 0.1 10*3/uL (ref 0.0–0.7)
Eosinophils Relative: 1.8 % (ref 0.0–5.0)
HCT: 48.2 % (ref 39.0–52.0)
Hemoglobin: 16.6 g/dL (ref 13.0–17.0)
Lymphocytes Relative: 22 % (ref 12.0–46.0)
Lymphs Abs: 1.5 10*3/uL (ref 0.7–4.0)
MCHC: 34.3 g/dL (ref 30.0–36.0)
MCV: 102.8 fl — ABNORMAL HIGH (ref 78.0–100.0)
Monocytes Absolute: 0.8 10*3/uL (ref 0.1–1.0)
Monocytes Relative: 11.5 % (ref 3.0–12.0)
Neutro Abs: 4.3 10*3/uL (ref 1.4–7.7)
Neutrophils Relative %: 64.3 % (ref 43.0–77.0)
Platelets: 191 10*3/uL (ref 150.0–400.0)
RBC: 4.69 Mil/uL (ref 4.22–5.81)
RDW: 14.1 % (ref 11.5–15.5)
WBC: 6.7 10*3/uL (ref 4.0–10.5)

## 2017-08-08 LAB — PSA: PSA: 2.5 ng/mL (ref 0.10–4.00)

## 2017-08-08 LAB — HEMOGLOBIN A1C: Hgb A1c MFr Bld: 6.2 % (ref 4.6–6.5)

## 2017-08-08 LAB — C-REACTIVE PROTEIN: CRP: 0.1 mg/dL — ABNORMAL LOW (ref 0.5–20.0)

## 2017-08-08 LAB — TSH: TSH: 2.55 u[IU]/mL (ref 0.35–4.50)

## 2017-08-09 ENCOUNTER — Other Ambulatory Visit (INDEPENDENT_AMBULATORY_CARE_PROVIDER_SITE_OTHER): Payer: Medicare Other

## 2017-08-09 ENCOUNTER — Telehealth: Payer: Self-pay | Admitting: Pulmonary Disease

## 2017-08-09 DIAGNOSIS — E785 Hyperlipidemia, unspecified: Secondary | ICD-10-CM

## 2017-08-09 LAB — LIPID PANEL
Cholesterol: 116 mg/dL (ref 0–200)
HDL: 30.7 mg/dL — ABNORMAL LOW (ref >39.00)
LDL Cholesterol: 53 mg/dL (ref 0–99)
NonHDL: 85.42
Total CHOL/HDL Ratio: 4
Triglycerides: 164 mg/dL — ABNORMAL HIGH (ref 0.0–149.0)
VLDL: 32.8 mg/dL (ref 0.0–40.0)

## 2017-08-09 NOTE — Telephone Encounter (Signed)
I have sent down an add on sheet to the lab to add on the lipid panel per pts request.  He is aware that I will call him if he will need to come back for redraw.

## 2017-08-09 NOTE — Telephone Encounter (Signed)
Called and spoke with pt and he is aware that the lab did have enough blood to run the lipid panel.  Nothing further is needed.

## 2017-08-09 NOTE — Telephone Encounter (Signed)
Nothing further needed.  Test is being run by the lab.

## 2017-08-13 ENCOUNTER — Encounter: Payer: Self-pay | Admitting: Pulmonary Disease

## 2017-08-13 ENCOUNTER — Ambulatory Visit (INDEPENDENT_AMBULATORY_CARE_PROVIDER_SITE_OTHER)
Admission: RE | Admit: 2017-08-13 | Discharge: 2017-08-13 | Disposition: A | Discharge status: Home / Self Care | Payer: Medicare Other | Source: Ambulatory Visit | Attending: Pulmonary Disease | Admitting: Pulmonary Disease

## 2017-08-13 ENCOUNTER — Ambulatory Visit (INDEPENDENT_AMBULATORY_CARE_PROVIDER_SITE_OTHER): Payer: Medicare Other | Admitting: Pulmonary Disease

## 2017-08-13 VITALS — BP 130/64 | HR 70 | Temp 97.7°F | Ht 73.0 in | Wt 225.0 lb

## 2017-08-13 DIAGNOSIS — I10 Essential (primary) hypertension: Secondary | ICD-10-CM

## 2017-08-13 DIAGNOSIS — R7301 Impaired fasting glucose: Secondary | ICD-10-CM

## 2017-08-13 DIAGNOSIS — Z9889 Other specified postprocedural states: Secondary | ICD-10-CM

## 2017-08-13 DIAGNOSIS — I739 Peripheral vascular disease, unspecified: Secondary | ICD-10-CM

## 2017-08-13 DIAGNOSIS — H9192 Unspecified hearing loss, left ear: Secondary | ICD-10-CM

## 2017-08-13 DIAGNOSIS — Z23 Encounter for immunization: Secondary | ICD-10-CM | POA: Diagnosis not present

## 2017-08-13 DIAGNOSIS — I2510 Atherosclerotic heart disease of native coronary artery without angina pectoris: Secondary | ICD-10-CM | POA: Diagnosis not present

## 2017-08-13 DIAGNOSIS — I779 Disorder of arteries and arterioles, unspecified: Secondary | ICD-10-CM

## 2017-08-13 DIAGNOSIS — E559 Vitamin D deficiency, unspecified: Secondary | ICD-10-CM

## 2017-08-13 DIAGNOSIS — I872 Venous insufficiency (chronic) (peripheral): Secondary | ICD-10-CM | POA: Diagnosis not present

## 2017-08-13 DIAGNOSIS — E782 Mixed hyperlipidemia: Secondary | ICD-10-CM | POA: Diagnosis not present

## 2017-08-13 DIAGNOSIS — Z951 Presence of aortocoronary bypass graft: Secondary | ICD-10-CM

## 2017-08-13 DIAGNOSIS — E663 Overweight: Secondary | ICD-10-CM | POA: Diagnosis not present

## 2017-08-13 NOTE — Progress Notes (Signed)
Subjective:    Patient ID: [name] [name], male    DOB: [birth year], 76 y.o.   MRN: [id number]  HPI 76 y/o WM here for a follow up visit... he has multiple medical problems as noted below...   SEE PREV EPIC NOTES FOR EARLIER DATA>>   LABS 6/14:  FLP- on Lip10 w/ TChol/LDL ok but TG/HDL still off;  Chems- ok x BS=136, A1c=6.4;  CBC- wnl;  TSH=2.66;  PSA=1.65;  CRP done at pt request=0.5....  CXR 7/15 showed cardiomeg, no change in contour of mediastinum compared to mult old films, clear lungs, NAD...   CDopplers 7/15 showed 60-79% RICAstenosis & <46% LICAstenosis (no change)...   LABS 7/15:  FLP- TChol&LDL ok on Lip10 but TG incr & HDL too low;  Chems- ok w/ BS=115A1c=6.2;  CBC- wnl;  TSH=2.51;  PSA=1.78...   ~  January 28, 2015:  28mo ROV & [name] [name] reports a good interval- no new medical complaints or concerns; he has however been under a lot of stress regarding his 21y/o grand daughter (Hx obesity >300#, subseq gastric bypass in [place], now Dx w/ anorexia, & she was the victim of a sexual assault); he has also had 4 friends pass away over the last month, mult funerals etc;  We discussed these issues 7 offered anxiolytic but he declines 7 he seems o be handling it all as well as can be expected, he will call if he feels he needs Rx...     [name] on Diovan160 & HCT 12.5; Norvasc caused swelling; BP= 130/66 & feeling well w/o CP, palpit, dizzy, SOB, edema...    He saw [name] for Cards 8/15> known CAD & prev CABG 2005, normal LVF, on ASA/Plavix; stable on meds and no changes made...     [facility] Carot[facility] stenosis 1/16> he remains on ASA/Plavix & asymptomatic w/o signs of cerebral ischemia; f/u CDoppler 1/16 (no bruits) showed stable 60-79% right ICAstenosis & left <40% ICAstenosis (f/u Q88mo).    Chol> well controlled on Lip20; he is intol to Niacin... We reviewed prob list, meds, xrays and labs> see below for updates >> Given 2015 flu vaccine today...  ~  July 29, 2015:  72mo ROV &  [name] [name] reports doing satis "I'm handling stress better these days"- continued issues w/ 49 y/o grand-daughter... We reviewed the following medical problems during today's office visit >>     [name] on Diovan160 & HCT-12.5 (intol to BBlockers in past); BP= 142/64 & he says good at home too; denies CP, palpit, SOB, edema, etc...    CAD> s/p 4 vessel CABG 2005, on ASA/ Plavix; he saw DrNishan 8/15 for yearly check up- stable & no changes made...    Cerebrovasc Dis> on ASA/ Plavix; followed by [name] & seen 1/16 w/ f/u CDopplers stable w/ 60-79% right ICA stenosis, <40% left ICA stenosis, they are checking Q35mo...    CHOL> on Lip10- taking 1/2 of 20mg  tab daily; FLP 7/16 showed TChol 129, TG 182, HDL 31, LDL 62; he is intol Niacin & rec to incr exercise program & get wt down...    Overweight> weight= 226# which is up 2#; we reviewed diet, exercise, & wt loss program...    Hx colon polyps> f/u colonoscopy 2/13 by [name]- neg, no polyps & he rec f/u in 63yrs...    GU> he reports no benefit from Sildenafil & prev requested Urology appt to discuss additional alternatives but he never went...    DJD/ Vit D defic> he uses OTC  analgesics as needed; continues on Vit D 2000u daily w/ last VitD level 6/13= 34...    Hx TIA> SEE 6/10 Hosp> sm vessel dis, remote lacunar infarcts, intracranial atherosclerotic changes on MRA; CDopplers followed by [name] as above w/ 60-79% RICAstenosis; on ASA/Plavix...    Keloid> in lower sternal scar w/ plastic repair in past; he saw Derm 2016 for removal of SK, epidermoid cyst, lipoma EXAM reveals Afeb, VSS, O2sat=97% on RA;  HEENT- neg x wax;  Vasc- bilat C bruits R>L;  Chest- clear w/o w/r/r;  Heart- RR gr1/6SEM w/o r/g;  Abd- obese, soft, neg;  Ext- +VV/VI, w/o c/c/e... We reviewed prob list, meds, xrays and labs> see below for updates >>   LABS 7/16:  FLP- at goals on Cres5;  Chems- wnl x BS=111, A1c=6.1;  CBC- wnl;  TSH=3.01;  PSA=2.14...  ~  January 30, 2016:  34moRAzusahad right CAE w/ DPA 08/26/15 by DSharlot Gowdafor a progressive stenosis & did well;  He had cardiac clearance due to his hx CAD w/ prev CABG in 2005 & norm LVF; Myoview 08/18/15 showed c/w prior MI w/ peri-infarct ischemia, EF=55-65%, felt to be a low risk study;  EKG showed NSR, rate76/min, wnl, NAD;  He remains on ASA/ Plavix;  BP controlled on Diovan160, HCT12.5, w/ BP=138/72 today & he denies HAs, CP, palpit, SOB, edema;  He is reminded about diet/ exercise/ wt reduction...     He developed some urinary retention after carotid surg 08/2015- required in & out cath x2 & no prob since...     He has DJD & c/o some left hip pain; takes OTC analgesics as needed & he will check into Ortho (DrGraves) eval soon... EXAM reveals Afeb, VSS, O2sat=97% on RA;  HEENT- neg, mallampati2; Vasc- leftt C bruit & s/p R-CAE;  Chest- clear w/o w/r/r;  Heart- RR gr1/6SEM w/o r/g;  Abd- obese, soft, diastasis;  Ext- +VV/VI, w/o c/c/ +tr edema... IMP/PLAN>>  SJohncharlesis stable, continue same meds, needs incr exerc & wt reduction; OK Flu shot today...  ~  August 01, 2016:  683moOV & [name] [name] doing well, no new complaints or concerns- notes some fatigue but says "I'm feeling pretty good- just under lots of stress" w/ daugh financial problems, older grandkids, bro-in-law depression & bladder Ca, etc;  He has not been exercising & has not lost weight    [name] on Diovan160 & HCT-12.5 (intol to BBlockers in past); BP= 142/64 & he says good at home too; denies CP, palpit, SOB, edema, etc...    CAD> s/p 4 vessel CABG 2005, on ASA/ Plavix; he saw DrCherly Hensen/17 for yearly check up- stable & no changes made; he had Nuclear stress test 08/2015 showing prior MI & EF=56%, low risk study...    Cerebrovasc Dis> s/p R-CAE 08/2015- on ASA/ Plavix; followed by [name] & last seen 3/17 w/ f/u CDopplers showing patent R endart site w/o ICA stenosis, and 12-31-17%ICA stenosis...    CHOL> on Lip10- taking 1/2 of 2045mab daily; FLP 7/16 showed TChol 129,  TG 182, HDL 31, LDL 62; he is intol Niacin & rec to incr exercise program & get wt down...    Overweight> weight= 226# which is stable; we reviewed diet, exercise, & wt loss program...    Hx colon polyps> last colonoscopy 2/13 by [name]- neg, no polyps & he rec f/u in 10y75yr    GU> he reports no benefit from Sildenafil & prev requested Urology appt  to discuss additional alternatives but he never went...    DJD/ Vit D defic> he uses OTC analgesics as needed; continues on Vit D 2000u daily w/ last VitD level 6/13= 34...    Hx TIA> SEE 6/10 Hosp> sm vessel dis, remote lacunar infarcts, intracranial atherosclerotic changes on MRA; CDopplers followed by [name] as above w/ 60-79% RICAstenosis; on ASA/Plavix...    Keloid> in lower sternal scar w/ plastic repair in past; he saw Derm 2016 for removal of SK, epidermoid cyst, lipoma EXAM reveals Afeb, VSS, O2sat=97% on RA;  HEENT- neg x wax;  Vasc- bilat C bruits R>L;  Chest- clear w/o w/r/r;  Heart- RR gr1/6SEM w/o r/g;  Abd- obese, soft, neg;  Ext- +VV/VI, w/o c/c/e...  LABS 07/27/16>  FLP- ok on Lip20 x TG=188, HDL=29;  Chems- wnl x BS=120, A1c=6.4 on diet alone;  CBC- wnl;  TSH=2.21;  PSA=2.28  CXR 08/01/16>  Stable heart size & post-op changes, lungs clear- NAD... IMP/PLAN>>  He is on a simple medication regimen- continue same; most important however is diet/ exercise/ wt reduction- this would help his stress level as well, discussed in detail w/ pt... Note: >50% of this 25 min ROV was spent in counseling and coordination of care...  ~  February 13, 2017:  84mo ROV & when seen 07/2016 [name] [name] was under a lot of stress but otherw ok medically- BP controlled, Cards followed by [name] & stable, Carotid stenosis followed by [name] & stable (SEE PROB LIST)...   He reports that his stress has diminished ("I learned to say 'no' to my daughter"), brother-in-law Chriss Czar is doing better, but he reports wife [name] fell w/ Fx tibia on left & Yakub had to do total  care!  No new complaints or concerns today.    HBP, CAD-s/p 4 vessel CABG 2005, cerebrovasc dis, hx TIA>  On ASA/Plavix, Diovan160, HCT12.5; BP controlled at 128/72; he denies CP/ palpit/ dizzy/ edema; he saw [facility] at [facility] 09/[facility]- hx TIA w/o critical stenosis in 2011, s/p R-CAE 08/2015;  CDopplers 09/2016 showed patent R-CAE site, 2-11% LICA stenosis... NOTE: Tuvia still chews tobacco & strongly urged to stop!    Chol, overwt>  Weight down 8# to 216# today; last FLP 06/2016 was ok on Lip20-1/2 per day but TG elev & HDL low- advised low fat diet, incr exerc, get weight down...    GI/ GU>  stable EXAM reveals Afeb, VSS, O2sat=97% on RA;  HEENT- neg x wax;  Vasc- bilat C bruits R>L;  Chest- clear w/o w/r/r;  Heart- RR gr1/6SEM w/o r/g;  Abd- obese, soft, neg;  Ext- +VV/VI, w/o c/c/e... IMP/PLAN>>  Baily is stable on his current regimen- he needs to quit the chewing tobacco,and he needs a Prevnar-13 vaccine...   ~  August 13, 2017:  27mo ROV & [name] [name] is overall stable- notes several minor problems (eg-occas prob w/ balance, decr hearing in left ear, get tired doing yard work but no SOB or CP); We reviewed the following medical problems during today's office visit >>     [name] on Diovan160 & HCT-12.5 (intol to BBlockers in past); BP= 130/64 & he says good at home too; denies CP, palpit, SOB, edema, etc...    CAD> s/p 4 vessel CABG 2005, on ASA/ Plavix; he saw [name] 07/2016 for yearly check up- stable & no changes made; he had Nuclear stress test 08/2015 showing prior MI & EF=56%, low risk study; sched for f/u again 08/22/17.    Cerebrovasc Dis> s/p R-CAE 08/2015-  on ASA/ Plavix; followed by [name] & last seen 09/2016 w/ f/u CDopplers showing patent R endart site w/o ICA stenosis, and 0-98% LICA stenosis; he has f/u sched 10/15/17    CHOL> on Lip10- taking 1/2 of 20mg  tab daily; FLP 8/18 shows TChol 116, TG 164, HDL 31, LDL 53; he is intol Niacin & rec to incr exercise program & get wt down...    Overweight>  weight= 205# which is improved on diet & exercise=> we reviewed diet, exercise, & wt loss program...    Hx colon polyps> last colonoscopy 2/13 by [name]- neg, no polyps & he rec f/u in 9yrs...    GU> he reports no benefit from Sildenafil & prev requested Urology appt to discuss additional alternatives but he never went...    DJD/ Vit D defic> he uses OTC analgesics as needed; continues on Vit D 2000u daily w/ last VitD level 06/2016= 38...    Hx TIA> SEE 6/10 Hosp> sm vessel dis, remote lacunar infarcts, intracranial atherosclerotic changes on MRA; CDopplers followed by [name] as above w/ 60-79% RICAstenosis=> R-CAE 08/2015; on ASA/Plavix...    Keloid> in lower sternal scar w/ plastic repair in past; he saw Derm 2016 for removal of SK, epidermoid cyst, lipoma EXAM reveals Afeb, VSS, O2sat=100% on RA;  HEENT- neg x wax;  Vasc- bilat C bruits R>L;  Chest- clear w/o w/r/r;  Heart- RR gr1/6SEM w/o r/g;  Abd- obese, soft, neg;  Ext- +VV/VI, w/o c/c/e...  CXR 08/13/17>  Mild cardiomegaly, s/p CABG, coarse interstitial markings- stable and NAD, chr scarring at left heart border...  LABS 07/2017>  FLP- ok x TG=164, HDL=31;  Chems- wnl x BS=115, A1c=6.2;  CBC- wnl;  CRP=0.1 again (this is requested yearly by his wife); TSH=2.55;  PSA=2.50 IMP/PLAN:  [name] [name] is stable on current meds; we will refer to ENT for cerumen & hearing eval; reviewed diet/ exercise/ wt reduction; needs Prevnar-13 today, otherw up to date...            Problem List:    ENT >>  He sawDrWolicki 1191 for cerumen, hearing loss, presbycusis- candidate for hearing aides but he is holding off...  HYPERTENSION (ICD-401.9) >> ~  6/09:  prev on Atenolol25mg - stopped due to side effects & feels much better off BBlockers. ~  2/10:  Avapro300 changed to Diovan160 per his insurance company for $$$ reasons. ~  11/11:  BP= 150/80 & remains asymptomatic, doesn't want to incr meds- discussed diet/ exerc... ~  5/12:  BP= 132/70 & denies HA,  fatigue, visual changes, CP, palipit, dizziness, syncope, dyspnea, edema, etc... ~  1/13:  BP= 152/68 & he remains asymptomatic; we decided to add NORVASC 5mg /d ==> pt stopped on his own due to swelling. ~  6/13:  He saw DrNishan for f/u & BP= 154/80 on Diovan alone; added HCTZ 12.5mg /d... ~  7/13:  BP= 148/82 here & 130s/70s at home he says on DIOVAN160mg /d & HCTZ 12.5mg /d; continue same ~  CXR 7/13 showed cardiomeg, s/p CABG, bronchitic changes at lung bases, NAD... ~  1/14: on Diovan160 & HCT-12.5 (intol to BBlockers in past); BP= 142/80 & he says better at home; denies CP, palpit, SOB, edema, etc...  ~  7/14: on Diovan160 & HCT-12.5 (intol to BBlockers in past); BP= 140/70 & he remains asymptomatic... ~  1/15: on Diovan160 & HCT-12.5; BP= 130/60 & he says good at home too; denies CP, palpit, SOB, edema, etc ~  CXR 7/15 showed cardiomeg, no change in contour of mediastinum compared  to mult old films, clear lungs, NAD ~  1/16:  on Diovan160 & HCT 12.5; Norvasc caused swelling; BP= 130/66 & feeling well w/o CP, palpit, dizzy, SOB, edema. ~  7/16: on Diovan160 & HCT-12.5 (intol to BBlockers in past); BP= 142/64 & he says even better at home; he remains asymptomatic.  CORONARY ARTERY DISEASE (ICD-414.00) - S/P 4 vessel CABG 2/05 by DrVanTrigt> on ASA/ PLAVIX & followed by [name] yearly & his notes are reviewed; he is intol to BBlocker therapy as noted... ~  1/13 & 7/13:  stable & denies angina, palpit, SOB, edema, etc; EKG showed NSR, rate71, WNL, NAD.[name].  ~  8/14: he had yearly f/u DrNishan> CAD, s/pCABG in 2005, norm LVF; EKG showed NSR, rate77, NSSTTWA, NAD; stable, rec to incr exercise & lose wt; no change in meds...  ~  8/15: He saw [name] for yearly Cards visit> known CAD & prev CABG 2005, normal LVF, on ASA/Plavix; stable on meds and no changes made  CEREBROVASCULAR DISEASE (ICD-437.9) - see above> on ASA 81mg /d, & PLAVIX 75mg /d... followed by Sharlot Gowda for [facility]. ~  6/10:  See 6/10 Hosp  records & f/u by [name] in his office... ~  6/11:  seen by [name] & stable w/o TIAs or amaurosis; CDopplers were stable w/ bilat 40-59% ICA stenoses, f/u 99yr. ~  He was due for a follow up eval 6/12> we don't have records & will call for the report... ~  6/13:  He saw [name] for f/u asymptomatic carotid dis> CDoppler 6/13 showed some progression of right sided stenosis (now 60-79%) but denies cerebral ischemic symptoms (right side remains 40-59%); continue ASA/Plavix & f/u planned 59mo. ~  1/14:  f/u CDopplers were stable w/ 60-79% right ICA stenosis & 20-39% left ICA stenosis & f/u planned in 50mo... ~  7/14:  f/u [facility]opplers at [facility] showed 60-79% right ICAstenosis and 0-40% left ICAstenosis- f/u Q61mo... ~  1/15:  f/u w/ [facility] for known Carotid stenosis> CDuplex showed 60-79% right carotid stenosis and 40-59% left carotid stenosis; continue ASA/Plavix and lifestyle mod strategies... ~  7/15:  f/u w/ [facility] for known Carotid stenosis> CDuplex unchanges w/ 60-79% RICAstenosis... ~  1/16: He saw [facility] for f/u Carotid stenosis> he remains on ASA/Plavix & asymptomatic w/o signs of cerebral ischemia; f/u CDoppler 1/16 (no bruits) showed stable 60-79% right ICAstenosis & left <40% ICAstenosis (f/u Q72mo).  VENOUS INSUFFICIENCY (ICD-459.81) - he is aware of need to elim salt, elevate legs, wear support hose.  HYPERCHOLESTEROLEMIA (ICD-272.0) - controlled on LIPITOR 20- 1/2 tab daily... he has tried NIACIN in the past and refuses to take it again due to headaches. ~  FLP 5/08 showed TChol 125, TG 171, HDL 24, LDL 67...  ~  [place] 12/08 showed TChol 107, TG 131, HDL 23, LDL 58... rec- same med, better diet & exercise. ~  FLP 2/10 on Lip10 (wt=235#) showed TChol 119, TG 158, HDL 24, LDL 63... rec- ditto ~  [place] 11/10 on Lip10 (wt=236#) showed TChol 115, TG 128, HDL 28, LDL 62 ~  FLP 5/11 on Lip10 (wt=233#) showed TChol 119, TG 144, HDL 27, LDL 63 ~  DrNishan suggested that he cut the Lipitor to 5mg /d & add Niaspan, but he  refuses the Niacin preps due to HAs & wants to contin Lip10. ~  FLP 11/11 on Lip10 (wt=235#) showed TChol 119, TG 113, HDL 31, LDL 66 ~  FLP 5/12 on Lip10 (wt=229#) showed TChol 118, TG 150, HDL 28, LDL 60 ~  FLP  6/13 on Lip10 (wt=232#) showed TChol 127, TG 164, HDL 27, LDL 67... He tells me [name] wanted him in an HDL trial... ~  6/14: on Lip20- taking 1/2 tab daily; FLP 6/14 shows TChol 126, TG 155, HDL 32, LDL 63; he is intol Niacin & rec to incr exercise program & get wt down ~  FLP 7/15 on Lip10 showed TChol 118, TG 166, HDL 29, LDL 56... ~  FLP 7/16 on Lip10 showed TChol 129, TG 182, HDL 31, LDL 62  DIABETES MELLITUS, BORDERLINE (ICD-790.29) - On diet Rx alone... ~  labs in hosp 6/10 showed BS= 109-113 ~  labs 11/10 showed BS= 109, A1c= 5.9 ~  labs 5/11 showed BS= 107 ~  labs 11/11 showed BS= 108 ~  Labs 5/12 showed BS= 97, A1c= 6.0 ~  Labs 6/13 showed BS=122, A1c= 6.4... Needs better diet & wt reduction. ~  Labs 6/14 showed BS= 136, A1c= 6.4 ~  Labs 7/15 on diet alone showed BS=115, A1c= 6.2 ~  Labs 7/16 on diet alone showed BS= 111, A1c= 6.1  OVERWEIGHT (ICD-278.02) - he is 6'2" tall and weighs ~230# for a BMI of 30... we have discussed diet & exercise stategies. ~  7/15: he has lost 6# down to 224# today... ~  7/16: he has gained 2# to 226#  COLONIC POLYPS (ICD-211.3) - last colonoscopy 10/07 by [name] showed several 5-6 mm polyps... path= tubular adenomas ... f/u planned 5 years. ~  Colonoscopy 2/13 by [name] was neg- no recurrent polyps etc & he felt that 71yr f/u was appropriate...  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 Rx w/ PT.  VITAMIN D DEFIC >> on Vit D supplement 2000u daily... ~  Labs 2/10 showed Vit D level = 14... rec to start Vit D  supplement OTC... ~  Labs 6/13 showed Vit D level = 34... rec to continue his VitD supplement daily...  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 ~  6/10:  Hosp w/ TIA w/ left face & arm symptoms that resolved quickly; CT showed small vessel disease;  MRI showed bilat remote lacunar infarcts, no acute infarct, +sm vessel dis;  MRA showed intracranial atherosclerotic changes, & ?focal stenosis (?70%) of left ICA in the neck; he saw [name] 06/08/09 (no bruits) w/ CDopplers done showing bilat 40-59% carotid stenoses & agreed w/ ASA + Plavix and f/u 57yr...  ~  6/13: saw [name] for f/u asymptomatic carotid dis> CDoppler 6/13 showed some progression of right sided stenosis (now 60-79%) but denies cerebral ischemic symptoms (right side remains 40-59%); continue ASA/Plavix & f/u planned 10mo. ~  1/14: he continues Q68mo f/u w/ [facility]- stable w/o cerebral ischemic symptoms => CDopplers are stable (see above).  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 he had plastic surg [facility] 5/09 w/ improved scar. ~  He has seb cyst over CSpine & he tells me that DrTafeen is going to remove this soon... ~  He has a small knot/lesion ?cyst over the fibular head on lat side of left lower leg; stable, no discomfort, no change & offered Ortho eval but he prefers to just watch it for now... ~  2016> he saw Derm for removal of  SK, epidermoid cyst, lipoma...  HEALTH MAINTENANCE: ~  GI:  followed by [name] & last colonoscopy was 10/07 w/ f/u planned 42yrs. ~  GU:  he is up to date on DRE & PSA checks here (PSA remains in the 1-2 range). ~  Immunizations:  he had [place] in 2000 w/ repeat PNEUMOVAX 11/10 & TDAP 5/11... he gets the yearly Seasonal Flu vaccine each fall...   Past Surgical History:  Procedure Laterality Date  . CARDIAC CATHETERIZATION  02/2004   "tried to stent; couldn't"  . CAROTID ENDARTERECTOMY Right 08/26/2015  . CORONARY  ANGIOPLASTY    . CORONARY ARTERY BYPASS GRAFT  Feb. 2005   4 vessel  . ENDARTERECTOMY Right 08/26/2015   Procedure: Right Carotid ENDARTERECTOMY with Patch Angioplasty ;  Surgeon: Rosetta Posner, MD;  Location: [facility];  Service: Vascular;  Laterality: Right;  . 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 Dessie Coma  . KELOID EXCISION    . PILONIDAL CYST EXCISION  1989    Outpatient Encounter Prescriptions as of 08/13/2017  Medication Sig  . aspirin 81 MG tablet Take 81 mg by mouth daily.    [name] atorvastatin (LIPITOR) 20 MG tablet Take 0.5 tablets (10 mg total) by mouth daily. Take as directed  . Cholecalciferol (VITAMIN D) 2000 UNITS CAPS Take 1 capsule by mouth daily.   . clopidogrel (PLAVIX) 75 MG tablet TAKE 1 TABLET BY MOUTH EVERY DAY  . hydrochlorothiazide (MICROZIDE) 12.5 MG capsule TAKE 1 CAPSULE (12.5 MG TOTAL) BY MOUTH DAILY.  . nitroGLYCERIN (NITROSTAT) 0.4 MG SL tablet Place 1 tablet (0.4 mg total) under the tongue every 5 (five) minutes as needed for chest pain.  . valsartan (DIOVAN) 80 MG tablet Take 2 tablets (160 mg total) by mouth daily.   No facility-administered encounter medications on file as of 08/13/2017.     Allergies  Allergen Reactions  . Niacin Other (See Comments)    REACTION: intol to NIACIN w/ headaches    Current Medications, Allergies, Past Medical History, Past Surgical History, Family History, and Social History were reviewed in [employer] record.    Review of Systems         See HPI - all other systems neg except as noted... The patient denies anorexia, fever, weight loss, weight gain, vision loss, decreased hearing, hoarseness, chest pain, syncope, dyspnea on exertion, peripheral edema, prolonged cough, headaches, hemoptysis, abdominal pain, melena, hematochezia, severe indigestion/heartburn, hematuria, incontinence, muscle weakness, suspicious skin lesions, transient blindness, difficulty walking, depression, unusual weight  change, abnormal bleeding, enlarged lymph nodes, and angioedema.     Objective:   Physical Exam    WD, Overweight, 76 y/o WM in NAD... Vital Signs:  Reviewed> GENERAL:  Alert & oriented; pleasant & cooperative. HEENT:  [place]/AT, EOM-wnl, PERRLA, Fundi-benign, EACs-clear, TMs-wnl, NOSE-clear, THROAT-clear & wnl. NECK:  Supple w/ fairROM; no JVD; normal carotid impulses w/o bruits; no thyromegaly or nodules palpated; no lymphadenopathy. CHEST:  Clear to P & A; without wheezes/ rales/ or rhonchi. HEART:  Median sternotomy scar w/ improved keloid, regular rhythm; without murmurs/ rubs/ or gallops. ABDOMEN:  Soft & nontender; normal bowel sounds; no organomegaly or masses detected. 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 EXT: without deformities, mild arthritic changes; + venous insuffic changes, no edema. NEURO:  CN's intact; motor testing normal; sensory testing normal; gait normal & balance OK. DERM:  Keloid as noted, no other lesions...  RADIOLOGY DATA:  Reviewed in the EPIC EMR & discussed w/ the patient...  LABORATORY DATA:  Reviewed in the [facility] EMR & discussed w/ the patient...   Assessment & Plan:    [name]  On Diovan160 & HCT 12.5 w/ improved/stable BP; needs to lose wt & elim sodium...  CAD, s/p CABG 2005> followed by [name] for Cards, stable & doing satis...  CEREBROVASC DIS/ TIA>  No cerebral ischemic symptoms on the ASA/ Plavix; he had right CEA 8/16 by [name]...  CHOL>  On Lip10 & stable; needs better low fat diet & get wt down...  Borderline DM/ IFG> adeq control w/ diet alone but he understands the importance of wt reduction.  GI>  He had colonoscopy f/u 2/13 & it was neg...  Other medical problems as noted... 08/01/16>   He is on a simple medication regimen- continue same; most important however is diet/ exercise/ wt reduction- this would help his stress level as well, discussed in detail w/ pt...  08/13/17>   [name] [name] is stable on current meds; we will refer to  ENT for cerumen & hearing eval; reviewed diet/ exercise/ wt reduction; needs Prevnar-13 today, otherw up to date...    Patient's Medications  New Prescriptions   No medications on file  Previous Medications   ASPIRIN 81 MG TABLET    Take 81 mg by mouth daily.     ATORVASTATIN (LIPITOR) 20 MG TABLET    Take 0.5 tablets (10 mg total) by mouth daily. Take as directed   CHOLECALCIFEROL (VITAMIN D) 2000 UNITS CAPS    Take 1 capsule by mouth daily.    CLOPIDOGREL (PLAVIX) 75 MG TABLET    TAKE 1 TABLET BY MOUTH EVERY DAY   HYDROCHLOROTHIAZIDE (MICROZIDE) 12.5 MG CAPSULE    TAKE 1 CAPSULE (12.5 MG TOTAL) BY MOUTH DAILY.   NITROGLYCERIN (NITROSTAT) 0.4 MG SL TABLET    Place 1 tablet (0.4 mg total) under the tongue every 5 (five) minutes as needed for chest pain.   VALSARTAN (DIOVAN) 80 MG TABLET    Take 2 tablets (160 mg total) by mouth daily.  Modified Medications   No medications on file  Discontinued Medications   No medications on file

## 2017-08-13 NOTE — Patient Instructions (Signed)
Today we updated your med list in our EPIC system...    Continue your current medications the same...  Today we reviewed your recent FASTING blood work & gave you a copy for your records... We also did a follow up CXR...    We will contact you w/ the results when available...   We gave you the second of the 2 recommended pneumonia shots- [id number]     With the current recommendations- this is the last pneumonia vaccine you will require...  Consider the new SHINGRIX shingles vaccine series (I can write a prescription whenever you decide about this vavvination)  Call for any questions...  Let's plan a follow up visit in 60mo, sooner if needed for problems.Marland KitchenMarland Kitchen

## 2017-08-15 ENCOUNTER — Other Ambulatory Visit: Payer: Self-pay | Admitting: Cardiovascular Disease

## 2017-08-19 ENCOUNTER — Other Ambulatory Visit: Payer: Self-pay | Admitting: Pulmonary Disease

## 2017-08-19 ENCOUNTER — Ambulatory Visit: Payer: Medicare Other | Admitting: Cardiovascular Disease

## 2017-08-19 MED ORDER — ATORVASTATIN CALCIUM 20 MG PO TABS: 10.0000 mg | ORAL_TABLET | Freq: Every day | ORAL | 3 refills | Status: DC

## 2017-08-20 ENCOUNTER — Telehealth: Payer: Self-pay | Admitting: Cardiovascular Disease

## 2017-08-20 MED ORDER — IRBESARTAN 150 MG PO TABS: 150.0000 mg | ORAL_TABLET | Freq: Every day | ORAL | 11 refills | Status: DC

## 2017-08-20 NOTE — Progress Notes (Signed)
Patient ID: [name] [name], male   DOB: Sep 17, 1941, 76 y.o.   MRN: [id number]     Cardiology Office Note   Date:  08/22/2017   ID:  [name] [name], DOB April 12, 1941, MRN [id number]  PCP:  [name] Space, MD  Cardiologist:  [name] [name]    No chief complaint on file.     History of Present Illness: [name] [name] is a 76 y.o. male  known coronary disease with previous coronary bypass surgery in 2005. he has normal LV function. August 2016 Had right CEA with [name] [name] 08/26/15    Nuclear 08/18/15     Nuclear stress EF: 56%.  There was no ST segment deviation noted during stress.  Defect 1: There is a medium defect of moderate severity present in the basal inferior, basal inferolateral, mid inferior and mid inferolateral location. May be related to diaphragmatic attenuation, but cannot rule out infarct with peri-infarct ischemia.  This is a low risk study.  Findings consistent with prior myocardial infarction with peri-infarct ischemia.  The left ventricular ejection fraction is normal (55-65%).   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  Neck healed well  Reviewed duplex 03/23/16 [facility] patent right CEA plaque no stenosis left ICA  Past Medical History:  Diagnosis Date  . Carotid artery occlusion   . Cerebrovascular disease   . Coronary artery disease    s/p CABG 2005; sees [name] [name] [name] yearly  . Gynecomastia   . Hypercholesterolemia   . Hypertension   . Keloid   . Neurodermatitis   . Overweight(278.02)   . Personal history of colonic polyps 10/08/2006   tubular adenomas  . Renal insufficiency   . Shoulder pain   . Transient ischemic attack 2006   "lasted ~ 5 seconds"  . Vitamin D deficiency     Past Surgical History:  Procedure Laterality Date  . CARDIAC CATHETERIZATION  02/2004   "tried to stent; couldn't"  . CAROTID ENDARTERECTOMY Right 08/26/2015  . CORONARY ANGIOPLASTY    . CORONARY ARTERY BYPASS GRAFT  Feb. 2005   4 vessel  .  ENDARTERECTOMY Right 08/26/2015   Procedure: Right Carotid ENDARTERECTOMY with Patch Angioplasty ;  Surgeon: [name] Posner, MD;  Location: [place];  Service: Vascular;  Laterality: Right;  . KELOID EXCISION  04/2008   on chest scar; [name]. Dessie [name]  . KELOID EXCISION    . PILONIDAL CYST EXCISION  1989     Current Outpatient Prescriptions  Medication Sig Dispense Refill  . aspirin 81 MG tablet Take 81 mg by mouth daily.      [name] atorvastatin (LIPITOR) 20 MG tablet Take 0.5 tablets (10 mg total) by mouth daily. Take as directed 90 tablet 3  . Cholecalciferol (VITAMIN D) 2000 UNITS CAPS Take 1 capsule by mouth daily.     . clopidogrel (PLAVIX) 75 MG tablet TAKE 1 TABLET BY MOUTH EVERY DAY 90 tablet 1  . hydrochlorothiazide (MICROZIDE) 12.5 MG capsule TAKE 1 CAPSULE (12.5 MG TOTAL) BY MOUTH DAILY. 90 capsule 1  . irbesartan (AVAPRO) 150 MG tablet Take 1 tablet (150 mg total) by mouth daily. 30 tablet 11  . nitroGLYCERIN (NITROSTAT) 0.4 MG SL tablet Place 1 tablet (0.4 mg total) under the tongue every 5 (five) minutes as needed for chest pain. 25 tablet 3   No current facility-administered medications for this visit.     Allergies:   Niacin    Social History:  The patient  reports that he has quit smoking. His  smokeless tobacco use includes Snuff. He reports that he does not drink alcohol or use drugs.   Family History:  The patient's family history includes Cancer in his father; Diabetes in his brother, mother, and sister; Heart attack in his mother; Heart disease in his brother and mother; Kidney disease in his mother; Lung cancer in his paternal uncle.    ROS:  General:no colds or fevers, no weight changes Skin:no rashes or ulcers HEENT:no blurred vision, no congestion CV:see HPI PUL:see HPI GI:no diarrhea constipation or melena, no indigestion GU:no hematuria, no dysuria MS:no joint pain, no claudication Neuro:no syncope, no lightheadedness Endo:no diabetes, no thyroid disease  Wt  Readings from Last 3 Encounters:  08/22/17 223 lb (101.2 kg)  08/13/17 225 lb (102.1 kg)  03/28/17 215 lb (97.5 kg)     PHYSICAL EXAM: VS:  BP 138/62   Pulse 69   Ht 6\' 1"  (1.854 m)   Wt 223 lb (101.2 kg)   SpO2 99%   BMI 29.42 kg/m  , BMI Body mass index is 29.42 kg/m. General:Pleasant affect, NAD Skin:Warm and dry, brisk capillary refill HEENT:normocephalic, sclera clear, mucus membranes moist Neck:supple, no JVD, post R CEA  Heart:S1S2 RRR without murmur, gallup, rub or click Lungs:clear without rales, rhonchi, or wheezes QKM:MNOT, non tender, + BS, do not palpate liver spleen or masses Varicose veins with stasis both legs scar right arm from radial harvest Neuro:alert and oriented X 3, MAE, follows commands, + facial symmetry    EKG:   08/19/15   SR no acute changes from 2015. 08/14/16  SR rate 70 nonspecific ST changes ? Old IMI  Q 3,F 08/22/17 SR rate 69 normal    Recent Labs: 08/08/2017: ALT 21; BUN 16; Creatinine, Ser 1.16; Hemoglobin 16.6; Platelets 191.0; Potassium 4.0; Sodium 140; TSH 2.55    Lipid Panel    Component Value Date/Time   CHOL 116 08/09/2017 1110   TRIG 164.0 (H) 08/09/2017 1110   HDL 30.70 (L) 08/09/2017 1110   CHOLHDL 4 08/09/2017 1110   VLDL 32.8 08/09/2017 1110   LDLCALC 53 08/09/2017 1110       Other studies Reviewed: Additional studies/ records that were reviewed today include: previous notes, nuc study.   ASSESSMENT AND PLAN:  1.  CAD s/p CABG 2005, no angina, neg nuc study for ischemia August 2016 stable continue medical Rx  2. Carotid:  Post right CEA with plaque and no stenosis on left by duplex 02/2016 f/u [name] Donnetta Hutching  3.  HTN  Controlled valsartan stopped due to recall   4. Hx TIA on ASA.   5. Hyperlipidemia stable on statin. Last LDL <70     [name]

## 2017-08-20 NOTE — Telephone Encounter (Signed)
Called patient back. Patient has rescheduled with Dr. Johnsie Cancel on 08/22/17. Will forward message to pharmacist to address medication.

## 2017-08-20 NOTE — Telephone Encounter (Signed)
New message      Pt c/o medication issue:  1. Name of Medication:  valsartan 2. How are you currently taking this medication (dosage and times per day)? 80mg  3. Are you having a reaction (difficulty breathing--STAT)?  no  4. What is your medication issue? Medication has been recalled.  Pt took last pill today.  He was scheduled to see Dr Johnsie Cancel 08-19-17 but we cancelled appt due to Dr Johnsie Cancel jury duty.  Please call and let him know what to do.  His pharmacy is [facility] [address]

## 2017-08-20 NOTE — Telephone Encounter (Signed)
Patient has an appointment on Thursday will check BP at that time. Sent medication to patient's pharmacy of choice. Patient verbalized understanding.

## 2017-08-20 NOTE — Telephone Encounter (Signed)
Follow up ° ° ° °Pt is returning call to Pam. °

## 2017-08-20 NOTE — Telephone Encounter (Signed)
Would recommend change to irbesartan 150mg  daily (pt currently on valsartan 160mg  daily per chart). If pt able check BP and call with any changes/concerns.

## 2017-08-21 ENCOUNTER — Encounter: Payer: Self-pay | Admitting: Cardiovascular Disease

## 2017-08-22 ENCOUNTER — Ambulatory Visit (INDEPENDENT_AMBULATORY_CARE_PROVIDER_SITE_OTHER): Payer: Medicare Other | Admitting: Cardiovascular Disease

## 2017-08-22 ENCOUNTER — Encounter: Payer: Self-pay | Admitting: Cardiovascular Disease

## 2017-08-22 VITALS — BP 138/62 | HR 69 | Ht 73.0 in | Wt 223.0 lb

## 2017-08-22 DIAGNOSIS — I779 Disorder of arteries and arterioles, unspecified: Secondary | ICD-10-CM

## 2017-08-22 DIAGNOSIS — I10 Essential (primary) hypertension: Secondary | ICD-10-CM

## 2017-08-22 DIAGNOSIS — I739 Peripheral vascular disease, unspecified: Secondary | ICD-10-CM

## 2017-08-22 NOTE — Patient Instructions (Signed)

## 2017-08-23 ENCOUNTER — Other Ambulatory Visit: Payer: Self-pay | Admitting: Cardiovascular Disease

## 2017-09-16 DIAGNOSIS — H6123 Impacted cerumen, bilateral: Secondary | ICD-10-CM | POA: Insufficient documentation

## 2017-09-25 ENCOUNTER — Ambulatory Visit: Payer: Medicare Other | Admitting: Cardiovascular Disease

## 2017-10-15 ENCOUNTER — Ambulatory Visit (INDEPENDENT_AMBULATORY_CARE_PROVIDER_SITE_OTHER): Payer: Medicare Other | Admitting: Family

## 2017-10-15 ENCOUNTER — Encounter: Payer: Self-pay | Admitting: Family

## 2017-10-15 ENCOUNTER — Ambulatory Visit (HOSPITAL_COMMUNITY)
Admission: RE | Admit: 2017-10-15 | Discharge: 2017-10-15 | Disposition: A | Discharge status: Home / Self Care | Payer: Medicare Other | Source: Ambulatory Visit | Attending: Family | Admitting: Family

## 2017-10-15 VITALS — BP 145/74 | HR 69 | Temp 97.2°F | Resp 20 | Ht 73.0 in | Wt 220.0 lb

## 2017-10-15 DIAGNOSIS — I6521 Occlusion and stenosis of right carotid artery: Secondary | ICD-10-CM | POA: Diagnosis not present

## 2017-10-15 DIAGNOSIS — Z720 Tobacco use: Secondary | ICD-10-CM

## 2017-10-15 DIAGNOSIS — Z87891 Personal history of nicotine dependence: Secondary | ICD-10-CM

## 2017-10-15 DIAGNOSIS — Z9889 Other specified postprocedural states: Secondary | ICD-10-CM | POA: Insufficient documentation

## 2017-10-15 LAB — VAS US CAROTID
LEFT ECA DIAS: -13 cm/s
Left CCA dist dias: 20 cm/s
Left CCA dist sys: 93 cm/s
Left CCA prox dias: 18 cm/s
Left CCA prox sys: 77 cm/s
Left ICA dist dias: -21 cm/s
Left ICA dist sys: -69 cm/s
Left ICA prox dias: -27 cm/s
Left ICA prox sys: -100 cm/s
RIGHT CCA MID DIAS: 14 cm/s
RIGHT ECA DIAS: -19 cm/s
Right CCA prox dias: 16 cm/s
Right CCA prox sys: 86 cm/s
Right cca dist sys: -101 cm/s

## 2017-10-15 NOTE — Patient Instructions (Addendum)
What You Need to Know About Smokeless Tobacco Use Tobacco use is one of the leading causes of cancer and other chronic health problems. Smokeless tobacco is tobacco that is put directly into the mouth instead of being smoked. It may also be called chewing tobacco or snuff. Smokeless tobacco is made from the leaves of tobacco plants and it comes in several forms:  Loose, dry leaves, plugs, or twists.  Moist pouches.  Dissolving lozenges or strips.  Chewing, sucking, or holding the tobacco in your mouth causes your mouth to make more saliva. The saliva mixes with the tobacco to make "tobacco juice" that is swallowed or spit out. How can smokeless tobacco affect me? Using smokeless tobacco:  Increases your risk of developing cancer. Smokeless tobacco contains at least 28 different types of cancer-causing chemicals (carcinogens).  Increases your chances of developing other long-term health problems, including high blood pressure, heart disease, stroke, and dental problems.  Can make you become addicted. Nicotine is one of the chemicals in tobacco. When you chew tobacco, you absorb nicotine from the tobacco juice. This can make you feel more alert than usual.  Can cause problems with pregnancy. Pregnant women who use smokeless tobacco are more likely to miscarry or deliver a baby too early (premature delivery).  Can affect the appearance and health of your mouth. Using smokeless tobacco may cause bad breath, yellow-brown teeth, mouth sores, cracking and bleeding lips, gum recession, and lesions on the soft tissues of your mouth (leukoplakia).  What are the benefits of not using smokeless tobacco? The benefits of not using smokeless tobacco include:  A healthy mind because: ? You avoid addiction.  A healthy body because: ? You avoid dental problems. ? You promote healthy pregnancy. ? You avoid long-term health problems.  A healthy wallet because: ? You avoid costs of buying  tobacco. ? You avoid health care costs in the future.  A healthy family because: ? You avoid accidental poisoning of children in your household.  What can happen if I continue to use smokeless tobacco? If you continue to use smokeless tobacco, you will increase your risk for developing certain cancers. These include:  Tongue.  Lips, mouth, and gums.  Throat (esophagus) and voice box (larynx).  Stomach.  Pancreas.  Bladder.  Colon.  Long-term use of smokeless tobacco can also lead to:  High blood pressure, heart disease, and stroke.  Gum disease, gum recession, and bone loss around the teeth.  Tooth decay.  How do I quit using smokeless tobacco? Quitting the use of smokeless tobacco can be hard, but it can be done. Follow these steps:  Pick a date to quit. Set a date within the next two weeks. This gives you time to prepare.  Write down the reasons why you are quitting. Keep this list in places where you will see it often, such as on your bathroom mirror or in your car or wallet.  Identify the people, places, things, and activities that make you want to use tobacco (triggers) and avoid them.  Get rid of any tobacco you have and remove any tobacco smells. To do this: ? Throw away all containers of tobacco at home, at work, and in your car. ? Throw away any other items that you use regularly when you chew tobacco. ? Clean your car and make sure to remove all tobacco-related items. ? Clean your home, including curtains and carpets.  Tell your family, friends, and coworkers that you are quitting. This can make quitting   easier.  Ask your health care provider for help quitting smokeless tobacco. This may involve treatment. Find out what treatment options are covered by your health insurance.  Keep track of how many days have passed since you quit. Remembering how long and hard you have worked to quit can help you avoid using tobacco again.  Where can I get support? Ask  your health care provider if there is a local support group for quitting smokeless tobacco. Where can I get more information? You can learn more about the risks of using smokeless tobacco and the benefits of quitting from these sources:  [place]: www.cancer.gov  American Cancer Society: www.cancer.org  When should I seek medical care? Seek medical care if you have:  White or other discolored patches in your mouth.  Difficulty swallowing.  A change in your voice.  Unexplained weight loss.  Stomach pain, nausea, or vomiting.  Summary  Smokeless tobacco contains at least 28 different chemicals that are known to cause cancer (carcinogen).  Nicotine is an addictive chemical in smokeless tobacco.  When you quit using smokeless tobacco, you lower your risk of developing cancer. This information is not intended to replace advice given to you by your health care provider. Make sure you discuss any questions you have with your health care provider. Document Released: 05/21/2011 Document Revised: 08/11/2016 Document Reviewed: 07/29/2015 Elsevier Interactive Patient Education  2017 [employer].    Stroke Prevention Some medical conditions and behaviors are associated with an increased chance of having a stroke. You may prevent a stroke by making healthy choices and managing medical conditions. How can I reduce my risk of having a stroke?  Stay physically active. Get at least 30 minutes of activity on most or all days.  Do not smoke. It may also be helpful to avoid exposure to secondhand smoke.  Limit alcohol use. Moderate alcohol use is considered to be: ? No more than 2 drinks per day for men. ? No more than 1 drink per day for nonpregnant women.  Eat healthy foods. This involves: ? Eating 5 or more servings of fruits and vegetables a day. ? Making dietary changes that address high blood pressure (hypertension), high cholesterol, diabetes, or  obesity.  Manage your cholesterol levels. ? Making food choices that are high in fiber and low in saturated fat, trans fat, and cholesterol may control cholesterol levels. ? Take any prescribed medicines to control cholesterol as directed by your health care provider.  Manage your diabetes. ? Controlling your carbohydrate and sugar intake is recommended to manage diabetes. ? Take any prescribed medicines to control diabetes as directed by your health care provider.  Control your hypertension. ? Making food choices that are low in salt (sodium), saturated fat, trans fat, and cholesterol is recommended to manage hypertension. ? Ask your health care provider if you need treatment to lower your blood pressure. Take any prescribed medicines to control hypertension as directed by your health care provider. ? If you are 35-76 years of age, have your blood pressure checked every 3-5 years. If you are 71 years of age or older, have your blood pressure checked every year.  Maintain a healthy weight. ? Reducing calorie intake and making food choices that are low in sodium, saturated fat, trans fat, and cholesterol are recommended to manage weight.  Stop drug abuse.  Avoid taking birth control pills. ? Talk to your health care provider about the risks of taking birth control pills if you are over 35  years old, smoke, get migraines, or have ever had a blood clot.  Get evaluated for sleep disorders (sleep apnea). ? Talk to your health care provider about getting a sleep evaluation if you snore a lot or have excessive sleepiness.  Take medicines only as directed by your health care provider. ? For some people, aspirin or blood thinners (anticoagulants) are helpful in reducing the risk of forming abnormal blood clots that can lead to stroke. If you have the irregular heart rhythm of atrial fibrillation, you should be on a blood thinner unless there is a good reason you cannot take them. ? Understand all  your medicine instructions.  Make sure that other conditions (such as anemia or atherosclerosis) are addressed. Get help right away if:  You have sudden weakness or numbness of the face, arm, or leg, especially on one side of the body.  Your face or eyelid droops to one side.  You have sudden confusion.  You have trouble speaking (aphasia) or understanding.  You have sudden trouble seeing in one or both eyes.  You have sudden trouble walking.  You have dizziness.  You have a loss of balance or coordination.  You have a sudden, severe headache with no known cause.  You have new chest pain or an irregular heartbeat. Any of these symptoms may represent a serious problem that is an emergency. Do not wait to see if the symptoms will go away. Get medical help at once. Call your local emergency services (911 in [place] Do not drive yourself to the hospital. This information is not intended to replace advice given to you by your health care provider. Make sure you discuss any questions you have with your health care provider. Document Released: 01/24/2005 Document Revised: 05/24/2016 Document Reviewed: 06/19/2013 Elsevier Interactive Patient Education  2017 [employer].

## 2017-10-15 NOTE — Progress Notes (Signed)
Chief Complaint: Follow up Extracranial Carotid Artery Stenosis   History of Present Illness  [name] [name] is a 76 y.o. male malepatient of Dr. Donnetta Hutching who is s/p right CEA on 08/26/15.  He had a TIA about 2011 with no critical stenosis at that time. He did develop severe asymptomatic disease and underwent uneventful right carotid endarterectomy in August 2016. He is here today for ultrasound follow-up. He reports no new medical difficulties. Specifically denies any neurologic deficits.   In June of 2011 or 2012 he experienced mild left facial drooping and numbness that lasted 3-4 minutes, was evaluated at [facility] ED, states CT of his head did not show a stroke; states he has had not had any further TIA or stroke symptoms.  He states that his blood pressure usually runs 130-135/65.  He admits to not getting enough exercise.   He denies claudication symptoms.  The patient denies amaurosis fugax or monocular blindness. Pt. denies hemiplegia. The patient denies receptive or expressive aphasia. Pt. denies extremity weakness.   He reports his granddaughter is going through some medical issues which is a source of stress for him.  He had a 4 vessel CABG in 2005. He sees Dr. Johnsie Cancel yearly.  Pt Diabetic: No, Review of records: 6.2 A1C in August 2018, but states he does not have DM Pt smoker: non-smoker, he occasionally uses smokeless tobacco   Pt meds include:  Statin : Yes  ASA: Yes  Other anticoagulants/antiplatelets: Plavix   Past Medical History:  Diagnosis Date  . Carotid artery occlusion   . Cerebrovascular disease   . Coronary artery disease    s/p CABG 2005; sees Dr Johnsie Cancel yearly  . Gynecomastia   . Hypercholesterolemia   . Hypertension   . Keloid   . Neurodermatitis   . Overweight(278.02)   . Personal history of colonic polyps 10/08/2006   tubular adenomas  . Renal insufficiency   . Shoulder pain   . Transient ischemic attack 2006   "lasted ~ 5  seconds"  . Vitamin D deficiency     Social History Social History  Substance Use Topics  . Smoking status: Former Research scientist (life sciences)  . Smokeless tobacco: Current User    Types: Snuff     Comment: uses dip 3-4 times per week  . Alcohol use No    Family History Family History  Problem Relation Age of Onset  . Heart disease Mother        Before age 13  . Diabetes Mother   . Kidney disease Mother   . Heart attack Mother   . Cancer Father        Lung  . Diabetes Brother   . Heart disease Brother   . Diabetes Sister   . Lung cancer Paternal Uncle        questionable as to if it was lung ca  . Colon cancer Neg Hx     Surgical History Past Surgical History:  Procedure Laterality Date  . CARDIAC CATHETERIZATION  02/2004   "tried to stent; couldn't"  . CAROTID ENDARTERECTOMY Right 08/26/2015  . CORONARY ANGIOPLASTY    . CORONARY ARTERY BYPASS GRAFT  Feb. 2005   4 vessel  . ENDARTERECTOMY Right 08/26/2015   Procedure: Right Carotid ENDARTERECTOMY with Patch Angioplasty ;  Surgeon: Rosetta Posner, MD;  Location: [place];  Service: Vascular;  Laterality: Right;  . 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 Dessie Coma  . KELOID EXCISION    . PILONIDAL CYST  EXCISION  1989    Allergies  Allergen Reactions  . Niacin Other (See Comments)    REACTION: intol to NIACIN w/ headaches    Current Outpatient Prescriptions  Medication Sig Dispense Refill  . aspirin 81 MG tablet Take 81 mg by mouth daily.      [name] atorvastatin (LIPITOR) 20 MG tablet Take 0.5 tablets (10 mg total) by mouth daily. Take as directed 90 tablet 3  . Cholecalciferol (VITAMIN D) 2000 UNITS CAPS Take 1 capsule by mouth daily.     . clopidogrel (PLAVIX) 75 MG tablet TAKE 1 TABLET BY MOUTH EVERY DAY 90 tablet 1  . hydrochlorothiazide (MICROZIDE) 12.5 MG capsule TAKE ONE CAPSULE BY MOUTH EVERY DAY 90 capsule 3  . irbesartan (AVAPRO) 150 MG tablet Take 1 tablet (150 mg total) by mouth daily. 30 tablet 11  . nitroGLYCERIN  (NITROSTAT) 0.4 MG SL tablet Place 1 tablet (0.4 mg total) under the tongue every 5 (five) minutes as needed for chest pain. 25 tablet 3   No current facility-administered medications for this visit.     Review of Systems : See HPI for pertinent positives and negatives.  Physical Examination  Vitals:   10/15/17 1101 10/15/17 1104  BP: (!) 146/74 (!) 145/74  Pulse: 69   Resp: 20   Temp: (!) 97.2 F (36.2 C)   TempSrc: Oral   SpO2: 97%   Weight: 220 lb (99.8 kg)   Height: 6\' 1"  (1.854 m)    Body mass index is 29.03 kg/m.  General: WDWN male in NAD  GAIT:normal  Eyes: PERRLA  Pulmonary: Respirations are non labored, CTAB, good air movement in all fields.  Cardiac: regular rhythm and rate, no detected murmur.  VASCULAR EXAM Carotid Bruits Left Right   Negative  Negative    Abdominal aortic pulse is not palpable Radial pulses are faintly palpable right and  2+ palpable left   LE Pulses  LEFT  RIGHT   POPLITEAL  Not palpable  Not palpable  POSTERIOR TIBIAL  palpable  palpable   DORSALIS PEDIS ANTERIOR TIBIAL  not palpable  not palpable    Gastrointestinal: soft, nontender, BS WNL, no r/g, no palpable masses.  Musculoskeletal: No muscle atrophy/wasting. M/S 5/5 throughout, Extremities without ischemic changes.  Neurologic: A&O X 3; appropriate affect, speech is normal, CN 2-12 intact, Pain and light touch intact in extremities, Motor exam as listed above.     Assessment: [name] [name] is a 76 y.o. male who is s/p right carotid endarterectomy in August 2016. In June of 2011 or 2012 experienced mild left facial drooping and numbness that lasted 3-4 minutes, was evaluated at [facility] ED, states CT of his head did not show a stroke; reports he has had not had any further TIA or stroke symptoms. His atherosclerotic risk factors include pre-diabetes, occasional use of smokeless tobacco, CAD with history of 4 vessel CABG,  dyslipidemia, and hypertension.     DATA Carotid Duplex (10/15/17): No significant stenosis of the bilateral ECA or CCA.  Right ICA: CEA site with no evidence of restenosis.  Left ICA: 1-39% (high end of range) stenosis. Both vertebral arteries are antegrade, both subclavian arteries are multiphasic (normal).  No significant change compared to the exam on 10-09-16.    Plan:  The patient was counseled re cessation of smokeless tobacco use and given several free resources re this.    Follow-up in 1 year with Carotid Duplex scan.   I discussed in depth with the patient the  nature of atherosclerosis, and emphasized the importance of maximal medical management including strict control of blood pressure, blood glucose, and lipid levels, obtaining regular exercise, and continued cessation of smoking.  The patient is aware that without maximal medical management the underlying atherosclerotic disease process will progress, limiting the benefit of any interventions. The patient was given information about stroke prevention and what symptoms should prompt the patient to seek immediate medical care. Thank you for allowing [place] to participate in this patient's care.  Clemon Chambers, RN, MSN, FNP-C [facility] Office: [facility] Physician: Early  10/15/17 11:16 AM

## 2017-11-04 NOTE — Addendum Note (Signed)
Addended by: Lianne Cure A on: 11/04/2017 04:34 PM   Modules accepted: Orders

## 2018-01-27 ENCOUNTER — Other Ambulatory Visit: Payer: Self-pay | Admitting: Pulmonary Disease

## 2018-02-13 ENCOUNTER — Ambulatory Visit: Payer: Medicare Other | Admitting: Pulmonary Disease

## 2018-02-18 ENCOUNTER — Encounter: Payer: Self-pay | Admitting: Pulmonary Disease

## 2018-02-18 ENCOUNTER — Ambulatory Visit (INDEPENDENT_AMBULATORY_CARE_PROVIDER_SITE_OTHER): Payer: Medicare Other | Admitting: Pulmonary Disease

## 2018-02-18 VITALS — BP 122/62 | HR 77 | Temp 97.8°F | Ht 74.0 in | Wt 230.0 lb

## 2018-02-18 DIAGNOSIS — I6521 Occlusion and stenosis of right carotid artery: Secondary | ICD-10-CM

## 2018-02-18 DIAGNOSIS — E782 Mixed hyperlipidemia: Secondary | ICD-10-CM

## 2018-02-18 DIAGNOSIS — I2510 Atherosclerotic heart disease of native coronary artery without angina pectoris: Secondary | ICD-10-CM

## 2018-02-18 DIAGNOSIS — E559 Vitamin D deficiency, unspecified: Secondary | ICD-10-CM | POA: Diagnosis not present

## 2018-02-18 DIAGNOSIS — E663 Overweight: Secondary | ICD-10-CM

## 2018-02-18 DIAGNOSIS — Z951 Presence of aortocoronary bypass graft: Secondary | ICD-10-CM

## 2018-02-18 DIAGNOSIS — Z9889 Other specified postprocedural states: Secondary | ICD-10-CM

## 2018-02-18 DIAGNOSIS — I10 Essential (primary) hypertension: Secondary | ICD-10-CM | POA: Diagnosis not present

## 2018-02-18 DIAGNOSIS — I872 Venous insufficiency (chronic) (peripheral): Secondary | ICD-10-CM

## 2018-02-18 DIAGNOSIS — R7301 Impaired fasting glucose: Secondary | ICD-10-CM

## 2018-02-18 MED ORDER — ATORVASTATIN CALCIUM 20 MG PO TABS: 10.0000 mg | ORAL_TABLET | Freq: Every day | ORAL | 3 refills | Status: DC

## 2018-02-18 MED ORDER — CLOPIDOGREL BISULFATE 75 MG PO TABS: 75.0000 mg | ORAL_TABLET | Freq: Every day | ORAL | 1 refills | Status: DC

## 2018-02-18 NOTE — Patient Instructions (Signed)
Today we updated your med list in our EPIC system...    Continue your current medications the same...  Try [facility] to see if you can save on meds...  Call for any questions...  Let's plan a follow up visit in 18mo, sooner if needed for problems.Marland KitchenMarland Kitchen

## 2018-05-19 ENCOUNTER — Telehealth: Payer: Self-pay | Admitting: Pulmonary Disease

## 2018-05-19 ENCOUNTER — Other Ambulatory Visit: Payer: Self-pay | Admitting: Pulmonary Disease

## 2018-05-19 MED ORDER — CLOPIDOGREL BISULFATE 75 MG PO TABS: 75.0000 mg | ORAL_TABLET | Freq: Every day | ORAL | 1 refills | Status: DC

## 2018-05-19 NOTE — Telephone Encounter (Signed)
Rx was refilled  Spoke with the pt and notified that this was done

## 2018-06-02 DIAGNOSIS — M65341 Trigger finger, right ring finger: Secondary | ICD-10-CM | POA: Insufficient documentation

## 2018-06-09 ENCOUNTER — Other Ambulatory Visit: Payer: Self-pay | Admitting: Cardiovascular Disease

## 2018-07-25 ENCOUNTER — Other Ambulatory Visit: Payer: Self-pay | Admitting: Cardiovascular Disease

## 2018-07-25 MED ORDER — IRBESARTAN 150 MG PO TABS: 150.0000 mg | ORAL_TABLET | Freq: Every day | ORAL | 1 refills | Status: DC

## 2018-07-30 ENCOUNTER — Telehealth: Payer: Self-pay

## 2018-07-30 MED ORDER — LOSARTAN POTASSIUM 50 MG PO TABS: 50.0000 mg | ORAL_TABLET | Freq: Every day | ORAL | 3 refills | Status: DC

## 2018-07-30 NOTE — Telephone Encounter (Signed)
Can change to cozaar 50 mg

## 2018-07-30 NOTE — Telephone Encounter (Signed)
[facility] sent a fax stating Irbesartan 150 mg tablet is on backorder, no release date, and pleas send alternative request. Will forward to Dr. Johnsie Cancel for alternative medication to send in

## 2018-08-11 ENCOUNTER — Other Ambulatory Visit: Payer: Self-pay | Admitting: Pulmonary Disease

## 2018-08-11 ENCOUNTER — Telehealth: Payer: Self-pay | Admitting: Pulmonary Disease

## 2018-08-11 DIAGNOSIS — I10 Essential (primary) hypertension: Secondary | ICD-10-CM

## 2018-08-11 DIAGNOSIS — E785 Hyperlipidemia, unspecified: Secondary | ICD-10-CM

## 2018-08-11 DIAGNOSIS — Z0000 Encounter for general adult medical examination without abnormal findings: Secondary | ICD-10-CM

## 2018-08-11 NOTE — Telephone Encounter (Signed)
Per SN- ok for labs,  CBC, CMP, TSH, CRP, Lipids,and PSA to be ordered before OV, 08/18/18.  Orders placed and Patient notified.  Nothing further at this time.

## 2018-08-13 ENCOUNTER — Other Ambulatory Visit (INDEPENDENT_AMBULATORY_CARE_PROVIDER_SITE_OTHER): Payer: Medicare Other

## 2018-08-13 DIAGNOSIS — Z0000 Encounter for general adult medical examination without abnormal findings: Secondary | ICD-10-CM

## 2018-08-13 DIAGNOSIS — E785 Hyperlipidemia, unspecified: Secondary | ICD-10-CM

## 2018-08-13 DIAGNOSIS — I10 Essential (primary) hypertension: Secondary | ICD-10-CM

## 2018-08-13 LAB — COMPREHENSIVE METABOLIC PANEL
ALT: 21 U/L (ref 0–53)
AST: 23 U/L (ref 0–37)
Albumin: 4.1 g/dL (ref 3.5–5.2)
Alkaline Phosphatase: 66 U/L (ref 39–117)
BUN: 21 mg/dL (ref 6–23)
CO2: 29 mEq/L (ref 19–32)
Calcium: 9.5 mg/dL (ref 8.4–10.5)
Chloride: 102 mEq/L (ref 96–112)
Creatinine, Ser: 1.2 mg/dL (ref 0.40–1.50)
GFR: 62.77 mL/min (ref >60.00)
Glucose, Bld: 131 mg/dL — ABNORMAL HIGH (ref 70–99)
Potassium: 3.8 mEq/L (ref 3.5–5.1)
Sodium: 141 mEq/L (ref 135–145)
Total Bilirubin: 0.9 mg/dL (ref 0.2–1.2)
Total Protein: 6.6 g/dL (ref 6.0–8.3)

## 2018-08-13 LAB — CBC WITH DIFFERENTIAL/PLATELET
Basophils Absolute: 0 10*3/uL (ref 0.0–0.1)
Basophils Relative: 0.4 % (ref 0.0–3.0)
Eosinophils Absolute: 0.1 10*3/uL (ref 0.0–0.7)
Eosinophils Relative: 1.5 % (ref 0.0–5.0)
HCT: 44.8 % (ref 39.0–52.0)
Hemoglobin: 15.7 g/dL (ref 13.0–17.0)
Lymphocytes Relative: 21 % (ref 12.0–46.0)
Lymphs Abs: 1.3 10*3/uL (ref 0.7–4.0)
MCHC: 35 g/dL (ref 30.0–36.0)
MCV: 101 fl — ABNORMAL HIGH (ref 78.0–100.0)
Monocytes Absolute: 0.7 10*3/uL (ref 0.1–1.0)
Monocytes Relative: 10.9 % (ref 3.0–12.0)
Neutro Abs: 4.1 10*3/uL (ref 1.4–7.7)
Neutrophils Relative %: 66.2 % (ref 43.0–77.0)
Platelets: 196 10*3/uL (ref 150.0–400.0)
RBC: 4.44 Mil/uL (ref 4.22–5.81)
RDW: 13.9 % (ref 11.5–15.5)
WBC: 6.2 10*3/uL (ref 4.0–10.5)

## 2018-08-13 LAB — LIPID PANEL
Cholesterol: 117 mg/dL (ref 0–200)
HDL: 32.3 mg/dL — ABNORMAL LOW (ref >39.00)
LDL Cholesterol: 61 mg/dL (ref 0–99)
NonHDL: 84.62
Total CHOL/HDL Ratio: 4
Triglycerides: 120 mg/dL (ref 0.0–149.0)
VLDL: 24 mg/dL (ref 0.0–40.0)

## 2018-08-13 LAB — TSH: TSH: 2.24 u[IU]/mL (ref 0.35–4.50)

## 2018-08-13 LAB — C-REACTIVE PROTEIN: CRP: 0.1 mg/dL — ABNORMAL LOW (ref 0.5–20.0)

## 2018-08-13 LAB — PSA: PSA: 2.3 ng/mL (ref 0.10–4.00)

## 2018-08-17 NOTE — Progress Notes (Signed)
Subjective:    Patient ID: [name] [name], male    DOB: [birth year], 77 y.o.   MRN: [id number]  HPI 77 y/o WM here for a follow up visit... he has multiple medical problems as noted below...   SEE PREV EPIC NOTES FOR EARLIER DATA>>   LABS 6/14:  FLP- on Lip10 w/ TChol/LDL ok but TG/HDL still off;  Chems- ok x BS=136, A1c=6.4;  CBC- wnl;  TSH=2.66;  PSA=1.65;  CRP done at pt request=0.5....  CXR 7/15 showed cardiomeg, no change in contour of mediastinum compared to mult old films, clear lungs, NAD...   CDopplers 7/15 showed 60-79% RICAstenosis & <49% LICAstenosis (no change)...   LABS 7/15:  FLP- TChol&LDL ok on Lip10 but TG incr & HDL too low;  Chems- ok w/ BS=115A1c=6.2;  CBC- wnl;  TSH=2.51;  PSA=1.78...   ~  January 28, 2015:  83moROV & [name] [name] reports a good interval- no new medical complaints or concerns; he has however been under a lot of stress regarding his 21y/o grand daughter (Hx obesity >300#, [place]x w/ anorexia, & she was the victim of a sexual assault); he has also had 4 friends pass away over the last month, mult funerals etc;  We discussed these issues 7 offered anxiolytic but he declines 7 he seems o be handling it all as well as can be expected, he will call if he feels he needs Rx...     [name] on Diovan160 & HCT 12.5; Norvasc caused swelling; BP= 130/66 & feeling well w/o CP, palpit, dizzy, SOB, edema...    He saw DCherly Hensenfor Cards 8/15> known CAD & prev CABG 2005, normal LVF, on ASA/Plavix; stable on meds and no changes made...     [facility]/u Carotid stenosis 1/16> he remains on ASA/Plavix & asymptomatic w/o signs of cerebral ischemia; f/u CDoppler 1/16 (no bruits) showed stable 60-79% right ICAstenosis & left <40% ICAstenosis (f/u Q637mo    Chol> well controlled on Lip20; he is intol to Niacin... We reviewed prob list, meds, xrays and labs> see below for updates >> Given 2015 flu vaccine today...  ~  July 29, 2015:  63m10moV &  [name] [name] doing satis "I'm handling stress better these days"- continued issues w/ 22 70o grand-daughter... We reviewed the following medical problems during today's office visit >>     [name] on Diovan160 & HCT-12.5 (intol to BBlockers in past); BP= 142/64 & he says good at home too; denies CP, palpit, SOB, edema, etc...    CAD> s/p 4 vessel CABG 2005, on ASA/ Plavix; he saw DrNishan 8/15 for yearly check up- stable & no changes made...    Cerebrovasc Dis> on ASA/ Plavix; followed by [name] & seen 1/16 w/ f/u CDopplers stable w/ 60-79% right ICA stenosis, <40% left ICA stenosis, they are checking Q63mo10mo   CHOL> on Lip10- taking 1/2 of 20mg363m daily; FLP 7/16 showed TChol 129, TG 182, HDL 31, LDL 62; he is intol Niacin & rec to incr exercise program & get wt down...    Overweight> weight= 226# which is up 2#; we reviewed diet, exercise, & wt loss program...    Hx colon polyps> f/u colonoscopy 2/13 by [name]- neg, no polyps & he rec f/u in 71yrs22yr  GU> he reports no benefit from Sildenafil & prev requested Urology appt to discuss additional alternatives but he never went...    DJD/ Vit D defic> he uses OTC  analgesics as needed; continues on Vit D 2000u daily w/ last VitD level 6/13= 34...    Hx TIA> SEE 6/10 Hosp> sm vessel dis, remote lacunar infarcts, intracranial atherosclerotic changes on MRA; CDopplers followed by [name] as above w/ 60-79% RICAstenosis; on ASA/Plavix...    Keloid> in lower sternal scar w/ plastic repair in past; he saw Derm 2016 for removal of SK, epidermoid cyst, lipoma EXAM reveals Afeb, VSS, O2sat=97% on RA;  HEENT- neg x wax;  Vasc- bilat C bruits R>L;  Chest- clear w/o w/r/r;  Heart- RR gr1/6SEM w/o r/g;  Abd- obese, soft, neg;  Ext- +VV/VI, w/o c/c/e... We reviewed prob list, meds, xrays and labs> see below for updates >>   LABS 7/16:  FLP- at goals on Cres5;  Chems- wnl x BS=111, A1c=6.1;  CBC- wnl;  TSH=3.01;  PSA=2.14...  ~  January 30, 2016:  34moRAzusahad right CAE w/ DPA 08/26/15 by DSharlot Gowdafor a progressive stenosis & did well;  He had cardiac clearance due to his hx CAD w/ prev CABG in 2005 & norm LVF; Myoview 08/18/15 showed c/w prior MI w/ peri-infarct ischemia, EF=55-65%, felt to be a low risk study;  EKG showed NSR, rate76/min, wnl, NAD;  He remains on ASA/ Plavix;  BP controlled on Diovan160, HCT12.5, w/ BP=138/72 today & he denies HAs, CP, palpit, SOB, edema;  He is reminded about diet/ exercise/ wt reduction...     He developed some urinary retention after carotid surg 08/2015- required in & out cath x2 & no prob since...     He has DJD & c/o some left hip pain; takes OTC analgesics as needed & he will check into Ortho (DrGraves) eval soon... EXAM reveals Afeb, VSS, O2sat=97% on RA;  HEENT- neg, mallampati2; Vasc- leftt C bruit & s/p R-CAE;  Chest- clear w/o w/r/r;  Heart- RR gr1/6SEM w/o r/g;  Abd- obese, soft, diastasis;  Ext- +VV/VI, w/o c/c/ +tr edema... IMP/PLAN>>  [name] [name] stable, continue same meds, needs incr exerc & wt reduction; OK Flu shot today...  ~  August 01, 2016:  683moOV & [name] [name] doing well, no new complaints or concerns- notes some fatigue but says "I'm feeling pretty good- just under lots of stress" w/ daugh financial problems, older grandkids, bro-in-law depression & bladder Ca, etc;  He has not been exercising & has not lost weight    [name] on Diovan160 & HCT-12.5 (intol to BBlockers in past); BP= 142/64 & he says good at home too; denies CP, palpit, SOB, edema, etc...    CAD> s/p 4 vessel CABG 2005, on ASA/ Plavix; he saw DrCherly Hensen/17 for yearly check up- stable & no changes made; he had Nuclear stress test 08/2015 showing prior MI & EF=56%, low risk study...    Cerebrovasc Dis> s/p R-CAE 08/2015- on ASA/ Plavix; followed by [name] & last seen 3/17 w/ f/u CDopplers showing patent R endart site w/o ICA stenosis, and 12-31-17%ICA stenosis...    CHOL> on Lip10- taking 1/2 of 2045mab daily; FLP 7/16 showed TChol 129,  TG 182, HDL 31, LDL 62; he is intol Niacin & rec to incr exercise program & get wt down...    Overweight> weight= 226# which is stable; we reviewed diet, exercise, & wt loss program...    Hx colon polyps> last colonoscopy 2/13 by [name]- neg, no polyps & he rec f/u in 10y75yr    GU> he reports no benefit from Sildenafil & prev requested Urology appt  to discuss additional alternatives but he never went...    DJD/ Vit D defic> he uses OTC analgesics as needed; continues on Vit D 2000u daily w/ last VitD level 6/13= 34...    Hx TIA> SEE 6/10 Hosp> sm vessel dis, remote lacunar infarcts, intracranial atherosclerotic changes on MRA; CDopplers followed by [name] as above w/ 60-79% RICAstenosis; on ASA/Plavix...    Keloid> in lower sternal scar w/ plastic repair in past; he saw Derm 2016 for removal of SK, epidermoid cyst, lipoma EXAM reveals Afeb, VSS, O2sat=97% on RA;  HEENT- neg x wax;  Vasc- bilat C bruits R>L;  Chest- clear w/o w/r/r;  Heart- RR gr1/6SEM w/o r/g;  Abd- obese, soft, neg;  Ext- +VV/VI, w/o c/c/e...  LABS 07/27/16>  FLP- ok on Lip20 x TG=188, HDL=29;  Chems- wnl x BS=120, A1c=6.4 on diet alone;  CBC- wnl;  TSH=2.21;  PSA=2.28  CXR 08/01/16>  Stable heart size & post-op changes, lungs clear- NAD... IMP/PLAN>>  He is on a simple medication regimen- continue same; most important however is diet/ exercise/ wt reduction- this would help his stress level as well, discussed in detail w/ pt... Note: >50% of this 25 min ROV was spent in counseling and coordination of care...  ~  February 13, 2017:  77moROV & when seen 07/2016 SCynthiawas under a lot of stress but otherw ok medically- BP controlled, Cards followed by DCherly Hensen& stable, Carotid stenosis followed by [name] & stable (SEE PROB LIST)...   He reports that his stress has diminished ("I learned to say 'no' to my daughter"), brother-in-law RChriss Czaris doing better, but he reports wife VVanita Inglesfell w/ Fx tibia on left & SCalixtohad to do total  care!  No new complaints or concerns today.    HBP, CAD-s/p 4 vessel CABG 2005, cerebrovasc dis, hx TIA>  On ASA/Plavix, Diovan160, HCT12.5; BP controlled at 128/72; he denies CP/ palpit/ dizzy/ edema; he [facility] at [facility]- hx TIA w/o critical stenosis in 2011, s/p R-CAE 08/2015;  CDopplers 09/2016 showed patent R-CAE site, 13-61%LICA stenosis... NOTE: [name] [name] chews tobacco & strongly urged to stop!    Chol, overwt>  Weight down 8# to 216# today; last FLP 06/2016 was ok on Lip20-1/2 per day but TG elev & HDL low- advised low fat diet, incr exerc, get weight down...    GI/ GU>  stable EXAM reveals Afeb, VSS, O2sat=97% on RA;  HEENT- neg x wax;  Vasc- bilat C bruits R>L;  Chest- clear w/o w/r/r;  Heart- RR gr1/6SEM w/o r/g;  Abd- obese, soft, neg;  Ext- +VV/VI, w/o c/c/e... IMP/PLAN>>  SHaileis stable on his current regimen- he needs to quit the chewing tobacco,and he needs a Prevnar-13 vaccine...  ~  August 13, 2017:  650moOV & StGilbertos overall stable- notes several minor problems (eg-occas prob w/ balance, decr hearing in left ear, get tired doing yard work but no SOB or CP)... We reviewed the following medical problems during today's office visit>      [name] on Diovan160 & HCT-12.5 (intol to BBlockers in past); BP= 130/64 & he says good at home too; denies CP, palpit, SOB, edema, etc...    CAD> s/p 4 vessel CABG 2005, on ASA/ Plavix; he saw DrCherly Hensen/2017 for yearly check up- stable & no changes made; he had Nuclear stress test 08/2015 showing prior MI & EF=56%, low risk study; sched for f/u again 08/22/17.    Cerebrovasc Dis> s/p R-CAE 08/2015- on  ASA/ Plavix; followed by [name] & last seen 09/2016 w/ f/u CDopplers showing patent R endart site w/o ICA stenosis, and 0-17% LICA stenosis; he has f/u sched 10/15/17    CHOL> on Lip10- taking 1/2 of 15m tab daily; FLP 8/18 shows TChol 116, TG 164, HDL 31, LDL 53; he is intol Niacin & rec to incr exercise program & get wt down...    Overweight>  weight= 205# which is improved on diet & exercise=> we reviewed diet, exercise, & wt loss program...    Hx colon polyps> last colonoscopy 2/13 by [name]- neg, no polyps & he rec f/u in 110yr..    GU> he reports no benefit from Sildenafil & prev requested Urology appt to discuss additional alternatives but he never went...    DJD/ Vit D defic> he uses OTC analgesics as needed; continues on Vit D 2000u daily w/ last VitD level 06/2016= 38...    Hx TIA> SEE 6/10 Hosp> sm vessel dis, remote lacunar infarcts, intracranial atherosclerotic changes on MRA; CDopplers followed by [name] as above w/ 60-79% RICAstenosis=> R-CAE 08/2015; on ASA/Plavix...    Keloid> in lower sternal scar w/ plastic repair in past; he saw Derm 2016 for removal of SK, epidermoid cyst, lipoma EXAM reveals Afeb, VSS, O2sat=100% on RA;  HEENT- neg x wax;  Vasc- bilat C bruits R>L;  Chest- clear w/o w/r/r;  Heart- RR gr1/6SEM w/o r/g;  Abd- obese, soft, neg;  Ext- +VV/VI, w/o c/c/e...  CXR 08/13/17>  Mild cardiomegaly, s/p CABG, coarse interstitial markings- stable and NAD, chr scarring at left heart border...  LABS 07/2017>  FLP- ok x TG=164, HDL=31;  Chems- wnl x BS=115, A1c=6.2;  CBC- wnl;  CRP=0.1 again (this is requested yearly by his wife); TSH=2.55;  PSA=2.50 IMP/PLAN:  [name] [name] is stable on current meds; we will refer to ENT for cerumen & hearing eval; reviewed diet/ exercise/ wt reduction; needs Prevnar-13 today, otherw up to date...    ~  February 18, 2018:  61m35moV & SteJordieports doing satis- no new complaints or concerns;  Wife wanted us [place] know that he had tick exposure 3wks ago- no rash, no fever, feeling well, etc... ROS is essentially neg- he denies CP/ palpit/ SB/ edema;  He denies cough, sput, hemoptysis, f/c/s, etc;  GI is neg- w/o n/v/d/c/ no blood seen;  Similarly GU is ok w/o voiding difficulties (occas nocturia noted);...  We reviewed the following interval med records in Epic>      He saw CARDS- DCherly Hensen  08/22/17>  CAD- s/p 4 vessel CABG in 02/2004; norm LVF w/ Myoview 8/16 showing EF=56%, prior MI; s/p R-CAE by [name] 08/2015; Note reviewed- no angina, continue same meds, f/u 58yr17yr   He saw ENT- DrWolicki on 9/175/10/25>cr hearing w/ bilat ear cerumen impactions- removed & drums were wnl, rec to ret for audiometry...     He saw [facility]- SNickel,NP on 10/15/17>  S/p R-CAE 08/2015, hx TIA 2011, doing satis w/o cerebral ischemic symptoms; CDopplers 09/2017 showed no evid of restenosis, 1-39% L-ICA stenosis & no change from prev CDopplers; reminded to quit all smokeless tobacco, diet, exercise, get wt down...  We reviewed the following medical problems during today's office visit>      HBP, CAD- s/p CABG 2005, Cerebrovasc  Dis- s/p R-CAE 2016, hx TIA>  Followed by DrNiCherly HensenrEarly on ASA/Plavix, Avapro150, HCT12.5 w/ BP=122/62    Chol, overweight, uses smokeless tobacco, too sedentary> NO CHANGE in his risk factors; advised to  continue Lip10, quit all tobacco products, DIET/ EXERCISE/ get weight down (he is up to 230# BMI=30+ today)...  EXAM reveals Afeb, VSS, O2sat=100% on RA;  HEENT- neg x wax;  Vasc- bilat C bruits R>L;  Chest- clear w/o w/r/r;  Heart- RR gr1/6SEM w/o r/g;  Abd- obese, soft, neg;  Ext- +VV/VI, w/o c/c/ +tr edema... IMP/PLAN>>  Ayan is stable overall but w/ his underlying CAD, cerebrovasc dis, etc he clearly needs to effect his risk factors more positively- quit smokeless tobacco, diet, exercise, get weight down; we discussed "stacking the deck" in his favor w/ currnt med rx + these risk factor reduction strategies...            Problem List:    ENT >>  He sawDrWolicki 2202 for cerumen, hearing loss, presbycusis- candidate for hearing aides but he is holding off...  HYPERTENSION (ICD-401.9) >> ~  6/09:  prev on Atenolol69m- stopped due to side effects & feels much better off BBlockers. ~  2/10:  Avapro300 changed to Diovan160 per his insurance company for $$$ reasons. ~  11/11:   BP= 150/80 & remains asymptomatic, doesn't want to incr meds- discussed diet/ exerc... ~  5/12:  BP= 132/70 & denies HA, fatigue, visual changes, CP, palipit, dizziness, syncope, dyspnea, edema, etc... ~  1/13:  BP= 152/68 & he remains asymptomatic; we decided to add NORVASC 528md ==> pt stopped on his own due to swelling. ~  6/13:  He saw DrNishan for f/u & BP= 154/80 on Diovan alone; added HCTZ 12.74m56m... ~  7/13:  BP= 148/82 here & 130s/70s at home he says on DIOVAN160m54m& HCTZ 12.74mg/38mcontinue same ~  CXR 7/13 showed cardiomeg, s/p CABG, bronchitic changes at lung bases, NAD... ~  1/14: on Diovan160 & HCT-12.5 (intol to BBlockers in past); BP= 142/80 & he says better at home; denies CP, palpit, SOB, edema, etc...  ~  7/14: on Diovan160 & HCT-12.5 (intol to BBlockers in past); BP= 140/70 & he remains asymptomatic... ~  1/15: on Diovan160 & HCT-12.5; BP= 130/60 & he says good at home too; denies CP, palpit, SOB, edema, etc ~  CXR 7/15 showed cardiomeg, no change in contour of mediastinum compared to mult old films, clear lungs, NAD ~  1/16:  on Diovan160 & HCT 12.5; Norvasc caused swelling; BP= 130/66 & feeling well w/o CP, palpit, dizzy, SOB, edema. ~  7/16: on Diovan160 & HCT-12.5 (intol to BBlockers in past); BP= 142/64 & he says even better at home; he remains asymptomatic.  CORONARY ARTERY DISEASE (ICD-414.00) - S/P 4 vessel CABG 2/05 by DrVanTrigt> on ASA/ PLAVIX & followed by DrNisCherly Hensenly & his notes are reviewed; he is intol to BBlocker therapy as noted... ~  1/13 & 7/13:  stable & denies angina, palpit, SOB, edema, etc; EKG showed NSR, rate71, WNL, NAD...  ~[name] 8/14: he had yearly f/u DrNishan> CAD, s/pCABG in 2005, norm LVF; EKG showed NSR, rate77, NSSTTWA, NAD; stable, rec to incr exercise & lose wt; no change in meds...  ~  8/15: He saw DrNisCherly Hensenyearly Cards visit> known CAD & prev CABG 2005, normal LVF, on ASA/Plavix; stable on meds and no changes made  CEREBROVASCULAR  DISEASE (ICD-437.9) - see above> on ASA 81mg/49m PLAVIX 774mg/d51mfollowed by DrEarlySharlot GowdaS. ~  6/10:  See 6/10 Hosp records & f/u by [name] in his office... ~  6/11:  seen by [name] & stable w/o TIAs or amaurosis; CDopplers were stable w/ bilat 40-59% ICA  stenoses, f/u 66yr ~  He was due for a follow up eval 6/12> we don't have records & will call for the report... ~  6/13:  He saw [name] for f/u asymptomatic carotid dis> CDoppler 6/13 showed some progression of right sided stenosis (now 60-79%) but denies cerebral ischemic symptoms (right side remains 40-59%); continue ASA/Plavix & f/u planned 642mo~  1/14:  f/u CDopplers were stable w/ 60-79% right ICA stenosis & 20-39% left ICA stenosis & f/u planned in 61m71mo ~  7/14:  [facility] at [facility] showed 60-79% right ICAstenosis and 0-40% left ICAstenosis- f/u Q61mo24mo~  1/15:  f/u w/ [facility] for known Carotid stenosis> CDuplex showed 60-79% right carotid stenosis and 40-59% left carotid stenosis; continue ASA/Plavix and lifestyle mod strategies... ~  7/15:  f/u w/ [facility] for known Carotid stenosis> CDuplex unchanges w/ 60-79% RICAstenosis... 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  HYPERCHOLESTEROLEMIA (ICD-272.0) - controlled on LIPITOR 20- 1/2 tab daily... he has tried NIACIN in the past and refuses to take it again due to headaches. ~  FLP 5/08 showed TChol 125, TG 171, HDL 24, LDL 67...  ~  FLP 1[place]8 showed TChol 107, TG 131, HDL 23, LDL 58... rec- same med, better diet & exercise. ~  FLP 2/10 on Lip10 (wt=235#) showed TChol 119, TG 158, HDL 24, LDL 63... rec- ditto ~  FLP 1[place]0 on Lip10 (wt=236#) showed TChol 115, TG 128, HDL 28, LDL 62 ~  FLP 5/11 on Lip10  (wt=233#) showed TChol 119, TG 144, HDL 27, LDL 63 ~  DrNishan suggested that he cut the Lipitor to 5mg/d80madd Niaspan, but he refuses the Niacin preps due to HAs & wants to contin Lip10. ~  FLP 11/11 on Lip10 (wt=235#) showed TChol 119, TG 113, HDL 31, LDL 66 ~  FLP 5/12 on Lip10 (wt=229#) showed TChol 118, TG 150, HDL 28, LDL 60 ~  FLP 6/13 on Lip10 (wt=232#) showed TChol 127, TG 164, HDL 27, LDL 67... He tells me DrNishCherly Hensend him in an HDL trial... ~  6/14: on Lip20- taking 1/2 tab daily; FLP 6/14 shows TChol 126, TG 155, HDL 32, LDL 63; he is intol Niacin & rec to incr exercise program & get wt down ~  FLP 7/15 on Lip10 showed TChol 118, TG 166, HDL 29, LDL 56... ~  FLP 7/16 on Lip10 showed TChol 129, TG 182, HDL 31, LDL 62  DIABETES MELLITUS, BORDERLINE (ICD-790.29) - On diet Rx alone... ~  labs in hosp 6/10 showed BS= 109-113 ~  labs 11/10 showed BS= 109, A1c= 5.9 ~  labs 5/11 showed BS= 107 ~  labs 11/11 showed BS= 108 ~  Labs 5/12 showed BS= 97, A1c= 6.0 ~  Labs 6/13 showed BS=122, A1c= 6.4... Needs better diet & wt reduction. ~  Labs 6/14 showed BS= 136, A1c= 6.4 ~  Labs 7/15 on diet alone showed BS=115, A1c= 6.2 ~  Labs 7/16 on diet alone showed BS= 111, A1c= 6.1  OVERWEIGHT (ICD-278.02) - he is 6'2" tall and weighs ~230# for a BMI of 30... we have discussed diet & exercise stategies. ~  7/15: he has lost 6# down to 224# today... ~  7/16: he has  gained 2# to 226#  COLONIC POLYPS (ICD-211.3) - last colonoscopy 10/07 by [name] showed several 5-6 mm polyps... path= tubular adenomas ... f/u planned 5 years. ~  Colonoscopy 2/13 by [name] was neg- no recurrent polyps etc & he felt that 9yrf/u was appropriate...  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 Rx  w/ PT.  VITAMIN D DEFIC >> on Vit D supplement 2000u daily... ~  Labs 2/10 showed Vit D level = 14... rec to start Vit D supplement OTC... ~  Labs 6/13 showed Vit D level = 34... rec to continue his VitD supplement daily...  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 ~  6/10:  Hosp w/ TIA w/ left face & arm symptoms that resolved quickly; CT showed small vessel disease;  MRI showed bilat remote lacunar infarcts, no acute infarct, +sm vessel dis;  MRA showed intracranial atherosclerotic changes, & ?focal stenosis (?70%) of left ICA in the neck; he saw [name] 06/08/09 (no bruits) w/ CDopplers done showing bilat 40-59% carotid stenoses & agreed w/ ASA + Plavix and f/u 160yr.  ~  6/13: saw [name] for f/u asymptomatic carotid dis> CDoppler 6/13 showed some progression of right sided stenosis (now 60-79%) but denies cerebral ischemic symptoms (right side remains 40-59%); continue ASA/Plavix & f/u planned 49m57mo  1/14: he continues Q49mo33mo w/ [facility]- stable w/o cerebral ischemic symptoms => CDopplers are stable (see above).  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 [facility]. ~  He has seb cyst over CSpine & he tells me that DrTafeen is going to remove this soon... ~  He has a small knot/lesion ?cyst over the fibular head on lat side of left lower leg; stable, no discomfort, no change & offered Ortho eval but he prefers to just watch it for now... ~  2016> he saw Derm for removal of  SK, epidermoid cyst, lipoma...  HEALTH MAINTENANCE: ~  GI:  followed by [name] & last colonoscopy was 10/07 w/ f/u planned 2yrs17yr GU:  he is up to date on DRE & PSA checks here (PSA remains in the 1-2 range). ~  Immunizations:  he had PNEUMWilliams000 w/ repeat PNEUMOVAX 11/10 & TDAP 5/11... he gets the yearly Seasonal Flu vaccine each fall...   Past Surgical History:  Procedure Laterality  Date  . CARDIAC CATHETERIZATION  02/2004   "tried to stent; couldn't"  . CAROTID ENDARTERECTOMY Right 08/26/2015  . CORONARY ANGIOPLASTY    . CORONARY ARTERY BYPASS GRAFT  Feb. 2005   4 vessel  . ENDARTERECTOMY Right 08/26/2015   Procedure: Right Carotid ENDARTERECTOMY with Patch Angioplasty ;  Surgeon: Todd Rosetta Posner  [facility];  Laterality: Right;  . 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 HoldeDessie ComaELOID EXCISION    . PILONIDAL CYST EXCISION  1989    Outpatient Encounter Medications as of 02/18/2018  Medication Sig  . aspirin 81 MG tablet Take 81 mg by mouth daily.    . atoMarland Kitchenvastatin (LIPITOR) 20 MG tablet Take 0.5 tablets (10 mg total) by mouth daily. Take as directed  . Cholecalciferol (VITAMIN D) 2000 UNITS CAPS Take 1  capsule by mouth daily.   . nitroGLYCERIN (NITROSTAT) 0.4 MG SL tablet Place 1 tablet (0.4 mg total) under the tongue every 5 (five) minutes as needed for chest pain.  . [DISCONTINUED] atorvastatin (LIPITOR) 20 MG tablet Take 0.5 tablets (10 mg total) by mouth daily. Take as directed  . [DISCONTINUED] clopidogrel (PLAVIX) 75 MG tablet TAKE 1 TABLET BY MOUTH EVERY DAY  . [DISCONTINUED] clopidogrel (PLAVIX) 75 MG tablet Take 1 tablet (75 mg total) by mouth daily.  . [DISCONTINUED] hydrochlorothiazide (MICROZIDE) 12.5 MG capsule TAKE ONE CAPSULE BY MOUTH EVERY DAY  . [DISCONTINUED] irbesartan (AVAPRO) 150 MG tablet Take 1 tablet (150 mg total) by mouth daily.   No facility-administered encounter medications on file as of 02/18/2018.     Allergies  Allergen Reactions  . Niacin Other (See Comments)    REACTION: intol to NIACIN w/ headaches    Immunization History  Administered Date(s) Administered  . H1N1 02/03/2009  . Influenza Split 10/30/2011, 10/02/2012, 11/30/2013, 11/30/2017  . Influenza Whole 10/18/2009  . Influenza, High Dose Seasonal PF 11/30/2016  . Influenza,inj,Quad PF,6+ Mos 01/28/2015, 01/30/2016  . Pneumococcal  Conjugate-13 08/13/2017  . Pneumococcal Polysaccharide-23 10/01/1999, 11/17/2009  . Td 10/01/1999, 05/16/2010    Current Medications, Allergies, Past Medical History, Past Surgical History, Family History, and Social History were reviewed in [employer] record.    Review of Systems         See HPI - all other systems neg except as noted... The patient denies anorexia, fever, weight loss, weight gain, vision loss, decreased hearing, hoarseness, chest pain, syncope, dyspnea on exertion, peripheral edema, prolonged cough, headaches, hemoptysis, abdominal pain, melena, hematochezia, severe indigestion/heartburn, hematuria, incontinence, muscle weakness, suspicious skin lesions, transient blindness, difficulty walking, depression, unusual weight change, abnormal bleeding, enlarged lymph nodes, and angioedema.     Objective:   Physical Exam    WD, Overweight, 77 y/o WM in NAD... Vital Signs:  Reviewed> GENERAL:  Alert & oriented; pleasant & cooperative. HEENT:  [place]/AT, EOM-wnl, PERRLA, Fundi-benign, EACs-clear, TMs-wnl, NOSE-clear, THROAT-clear & wnl. NECK:  Supple w/ fairROM; no JVD; normal carotid impulses w/o bruits; no thyromegaly or nodules palpated; no lymphadenopathy. CHEST:  Clear to P & A; without wheezes/ rales/ or rhonchi. HEART:  Median sternotomy scar w/ improved keloid, regular rhythm; without murmurs/ rubs/ or gallops. ABDOMEN:  Soft & nontender; normal bowel sounds; no organomegaly or masses detected. 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 EXT: without deformities, mild arthritic changes; + venous insuffic changes, no edema. NEURO:  CN's intact; motor testing normal; sensory testing normal; gait normal & balance OK. DERM:  Keloid as noted, no other lesions...  RADIOLOGY DATA:  Reviewed in the EPIC EMR & discussed w/ the patient...  LABORATORY DATA:  Reviewed in the EPIC EMR & discussed w/ the patient...   Assessment & Plan:    [name]  On  Diovan160 & HCT 12.5 w/ improved/stable BP; needs to lose wt & elim sodium...  CAD, s/p CABG 2005> followed by [name] for Cards, stable & doing satis...  CEREBROVASC DIS/ TIA>  No cerebral ischemic symptoms on the ASA/ Plavix; he had right CEA 8/16 by [name]...  CHOL>  On Lip10 & stable; needs better low fat diet & get wt down...  Borderline DM/ IFG> adeq control w/ diet alone but he understands the importance of wt reduction.  GI>  He had colonoscopy f/u 2/13 & it was neg...  Other medical problems as noted... 08/01/16>   He is  on a simple medication regimen- continue same; most important however is diet/ exercise/ wt reduction- this would help his stress level as well, discussed in detail w/ pt...  08/13/17>   [name] [name] is stable on current meds; we will refer to ENT for cerumen & hearing eval; reviewed diet/ exercise/ wt reduction; needs Prevnar-13 today, otherw up to date...  02/18/18>   [name] [name] is stable overall but w/ his underlying CAD, cerebrovasc dis, etc he clearly needs to effect his risk factors more positively- quit smokeless tobacco, diet, exercise, get weight down; we discussed "stacking the deck" in his favor w/ currnt med rx + these risk factor reduction strategies   Patient's Medications  New Prescriptions   LOSARTAN (COZAAR) 50 MG TABLET    Take 1 tablet (50 mg total) by mouth daily.  Previous Medications   ASPIRIN 81 MG TABLET    Take 81 mg by mouth daily.     CHOLECALCIFEROL (VITAMIN D) 2000 UNITS CAPS    Take 1 capsule by mouth daily.    NITROGLYCERIN (NITROSTAT) 0.4 MG SL TABLET    Place 1 tablet (0.4 mg total) under the tongue every 5 (five) minutes as needed for chest pain.  Modified Medications   Modified Medication Previous Medication   ATORVASTATIN (LIPITOR) 20 MG TABLET atorvastatin (LIPITOR) 20 MG tablet      Take 0.5 tablets (10 mg total) by mouth daily. Take as directed    Take 0.5 tablets (10 mg total) by mouth daily. Take as directed   CLOPIDOGREL (PLAVIX) 75  MG TABLET clopidogrel (PLAVIX) 75 MG tablet      Take 1 tablet (75 mg total) by mouth daily.    TAKE 1 TABLET BY MOUTH EVERY DAY   HYDROCHLOROTHIAZIDE (MICROZIDE) 12.5 MG CAPSULE hydrochlorothiazide (MICROZIDE) 12.5 MG capsule      TAKE ONE CAPSULE BY MOUTH EVERY DAY    TAKE ONE CAPSULE BY MOUTH EVERY DAY  Discontinued Medications   CLOPIDOGREL (PLAVIX) 75 MG TABLET    Take 1 tablet (75 mg total) by mouth daily.   IRBESARTAN (AVAPRO) 150 MG TABLET    Take 1 tablet (150 mg total) by mouth daily.

## 2018-08-18 ENCOUNTER — Ambulatory Visit: Payer: Medicare Other | Admitting: Pulmonary Disease

## 2018-08-18 ENCOUNTER — Encounter: Payer: Self-pay | Admitting: Pulmonary Disease

## 2018-08-18 VITALS — BP 138/88 | HR 82 | Temp 97.8°F | Ht 73.0 in | Wt 222.8 lb

## 2018-08-18 DIAGNOSIS — R269 Unspecified abnormalities of gait and mobility: Secondary | ICD-10-CM

## 2018-08-18 DIAGNOSIS — E782 Mixed hyperlipidemia: Secondary | ICD-10-CM

## 2018-08-18 DIAGNOSIS — I872 Venous insufficiency (chronic) (peripheral): Secondary | ICD-10-CM

## 2018-08-18 DIAGNOSIS — Z951 Presence of aortocoronary bypass graft: Secondary | ICD-10-CM | POA: Diagnosis not present

## 2018-08-18 DIAGNOSIS — I10 Essential (primary) hypertension: Secondary | ICD-10-CM | POA: Diagnosis not present

## 2018-08-18 DIAGNOSIS — I2510 Atherosclerotic heart disease of native coronary artery without angina pectoris: Secondary | ICD-10-CM | POA: Diagnosis not present

## 2018-08-18 DIAGNOSIS — E559 Vitamin D deficiency, unspecified: Secondary | ICD-10-CM

## 2018-08-18 DIAGNOSIS — Z9889 Other specified postprocedural states: Secondary | ICD-10-CM

## 2018-08-18 DIAGNOSIS — I6521 Occlusion and stenosis of right carotid artery: Secondary | ICD-10-CM | POA: Diagnosis not present

## 2018-08-18 DIAGNOSIS — R7301 Impaired fasting glucose: Secondary | ICD-10-CM

## 2018-08-18 DIAGNOSIS — E663 Overweight: Secondary | ICD-10-CM

## 2018-08-18 NOTE — Patient Instructions (Addendum)
Today we updated your med list in our EPIC system...    Continue your current medications the same...  Today we did a follow up CXR...   We will contact you w/ the results when available...   We gave you a copy of your recent fasting blood work...  REMEMBER>>     Quit the tobacco (chewing)...    Low salt    Low carbs & get the weight down...  We will arrange for a NEURO appt to check on your gait abnormality...   Call for any questions or if we can be of service in any way.Marland KitchenMarland Kitchen

## 2018-08-19 ENCOUNTER — Encounter: Payer: Self-pay | Admitting: Pulmonary Disease

## 2018-08-19 ENCOUNTER — Ambulatory Visit (INDEPENDENT_AMBULATORY_CARE_PROVIDER_SITE_OTHER)
Admission: RE | Admit: 2018-08-19 | Discharge: 2018-08-19 | Disposition: A | Discharge status: Home / Self Care | Payer: Medicare Other | Source: Ambulatory Visit | Attending: Pulmonary Disease | Admitting: Pulmonary Disease

## 2018-08-19 DIAGNOSIS — I10 Essential (primary) hypertension: Secondary | ICD-10-CM | POA: Diagnosis not present

## 2018-08-19 NOTE — Progress Notes (Signed)
Subjective:    Patient ID: [name] [name], male    DOB: [birth year], 77 y.o.   MRN: [id number]  HPI [name] [name]... he has multiple medical problems as noted below...   SEE PREV EPIC NOTES FOR EARLIER DATA>>   LABS 6/14:  FLP- on Lip10 w/ TChol/LDL ok but TG/HDL still off;  Chems- ok x BS=136, A1c=6.4;  CBC- wnl;  TSH=2.66;  PSA=1.65;  CRP done at pt request=0.5....  CXR 7/15 showed cardiomeg, no change in contour of mediastinum compared to mult old films, clear lungs, NAD...   CDopplers 7/15 showed 60-79% RICAstenosis & <49% LICAstenosis (no change)...   LABS 7/15:  FLP- TChol&LDL ok on Lip10 but TG incr & HDL too low;  Chems- ok w/ BS=115A1c=6.2;  CBC- wnl;  TSH=2.51;  PSA=1.78...   ~  January 28, 2015:  [name] [name]; he has however been under a lot of stress regarding his 21y/o grand daughter (Hx obesity >300#, [place]x w/ anorexia, & she was the victim of a sexual assault); he has also had 4 friends pass away over the last month, mult funerals etc;  We discussed these issues 7 offered anxiolytic but he declines 7 he seems o be handling it all as well as can be expected, he will call if he feels he needs Rx...     [name] on Diovan160 & HCT 12.5; Norvasc caused swelling; BP= 130/66 & feeling well w/o CP, palpit, dizzy, SOB, edema...    He saw DCherly Hensenfor Cards 8/15> known CAD & prev CABG 2005, normal LVF, on ASA/Plavix; stable on meds and no changes made...     [facility]/u Carotid stenosis 1/16> he remains on ASA/Plavix & asymptomatic w/o signs of cerebral ischemia; f/u CDoppler 1/16 (no bruits) showed stable 60-79% right ICAstenosis & left <40% ICAstenosis (f/u Q637mo    Chol> well controlled on Lip20; he is intol to Niacin... We reviewed prob list, meds, xrays and labs> see below for updates >> Given 2015 flu vaccine today...  ~  July 29, 2015:  63m10moV &  SteDaikiports doing satis "I'm handling stress better these days"- continued issues w/ 24 [name] [name]... We reviewed the following medical problems during today's office [name] >>     [name] on Diovan160 & HCT-12.5 (intol to BBlockers in past); BP= 142/64 & he says good at home too; denies CP, palpit, SOB, edema, etc...    CAD> s/p 4 vessel CABG 2005, on ASA/ Plavix; he saw DrNishan 8/15 for yearly check up- stable & no changes made...    Cerebrovasc Dis> on ASA/ Plavix; followed by [name] & seen 1/16 w/ f/u CDopplers stable w/ 60-79% right ICA stenosis, <40% left ICA stenosis, they are checking Q63mo10mo   CHOL> on Lip10- taking 1/2 of 20mg363m daily; FLP 7/16 showed TChol 129, TG 182, HDL 31, LDL 62; he is intol Niacin & rec to incr exercise program & get wt down...    Overweight> weight= 226# which is up 2#; we reviewed diet, exercise, & wt loss program...    Hx colon polyps> f/u colonoscopy 2/13 by [name]- neg, no polyps & he rec f/u in 71yrs22yr  GU> he reports no benefit from Sildenafil & prev requested Urology appt to discuss additional alternatives but he never went...    DJD/ Vit D defic> he uses OTC  analgesics as needed; continues on Vit D 2000u daily w/ last VitD level 6/13= 34...    Hx TIA> SEE 6/10 Hosp> sm vessel dis, remote lacunar infarcts, intracranial atherosclerotic changes on MRA; CDopplers followed by [name] as above w/ 60-79% RICAstenosis; on ASA/Plavix...    Keloid> in lower sternal scar w/ plastic repair in past; he saw Derm 2016 for removal of SK, epidermoid cyst, lipoma EXAM reveals Afeb, VSS, O2sat=97% on RA;  HEENT- neg x wax;  Vasc- bilat C bruits R>L;  Chest- clear w/o w/r/r;  Heart- RR gr1/6SEM w/o r/g;  Abd- obese, soft, neg;  Ext- +VV/VI, w/o c/c/e... We reviewed prob list, meds, xrays and labs> see below for updates >>   LABS 7/16:  FLP- at goals on Cres5;  Chems- wnl x BS=111, A1c=6.1;  CBC- wnl;  TSH=3.01;  PSA=2.14...  ~  January 30, 2016:  [name] [name] by DSharlot Gowdafor a progressive stenosis & did well;  He had cardiac clearance due to his hx CAD w/ prev CABG in 2005 & norm LVF; Myoview 08/18/15 showed c/w prior MI w/ peri-infarct ischemia, EF=55-65%, felt to be a low risk study;  EKG showed NSR, rate76/min, wnl, NAD;  He remains on ASA/ Plavix;  BP controlled on Diovan160, HCT12.5, w/ BP=138/72 today & he denies HAs, CP, palpit, SOB, edema;  He is reminded about diet/ exercise/ wt reduction...     He developed some urinary retention after carotid surg 08/2015- required in & out cath x2 & no prob since...     He has DJD & c/o some left hip pain; takes OTC analgesics as needed & he will check into Ortho (DrGraves) eval soon... EXAM reveals Afeb, VSS, O2sat=97% on RA;  HEENT- neg, mallampati2; Vasc- leftt C bruit & s/p R-CAE;  Chest- clear w/o w/r/r;  Heart- RR gr1/6SEM w/o r/g;  Abd- obese, soft, diastasis;  Ext- +VV/VI, w/o c/c/ +tr edema... IMP/PLAN>>  SRylynis stable, continue same meds, needs incr exerc & wt reduction; OK Flu shot today...  ~  August 01, 2016:  677moOV & [name] [name] doing well, no new complaints or [name]- notes some fatigue but says "I'm feeling pretty good- just under lots of stress" w/ daugh financial problems, older grandkids, bro-in-law depression & bladder Ca, etc;  He has not been exercising & has not lost weight    [name] on Diovan160 & HCT-12.5 (intol to BBlockers in past); BP= 142/64 & he says good at home too; denies CP, palpit, SOB, edema, etc...    CAD> s/p 4 vessel CABG 2005, on ASA/ Plavix; he saw DrCherly Hensen/17 for yearly check up- stable & no changes made; he had Nuclear stress test 08/2015 showing prior MI & EF=56%, low risk study...    Cerebrovasc Dis> s/p R-CAE 08/2015- on ASA/ Plavix; followed by [name] & last seen 3/17 w/ f/u CDopplers showing patent R endart site w/o ICA stenosis, and 12-31-17%ICA stenosis...    CHOL> on Lip10- taking 1/2 of 2070mab daily; FLP 7/16 showed TChol 129,  TG 182, HDL 31, LDL 62; he is intol Niacin & rec to incr exercise program & get wt down...    Overweight> weight= 226# which is stable; we reviewed diet, exercise, & wt loss program...    Hx colon polyps> last colonoscopy 2/13 by [name]- neg, no polyps & he rec f/u in 10y53yr    GU> he reports no benefit from Sildenafil & prev requested Urology appt  to discuss additional alternatives but he never went...    DJD/ Vit D defic> he uses OTC analgesics as needed; continues on Vit D 2000u daily w/ last VitD level 6/13= 34...    Hx TIA> SEE 6/10 Hosp> sm vessel dis, remote lacunar infarcts, intracranial atherosclerotic changes on MRA; CDopplers followed by [name] as above w/ 60-79% RICAstenosis; on ASA/Plavix...    Keloid> in lower sternal scar w/ plastic repair in past; he saw Derm 2016 for removal of SK, epidermoid cyst, lipoma EXAM reveals Afeb, VSS, O2sat=97% on RA;  HEENT- neg x wax;  Vasc- bilat C bruits R>L;  Chest- clear w/o w/r/r;  Heart- RR gr1/6SEM w/o r/g;  Abd- obese, soft, neg;  Ext- +VV/VI, w/o c/c/e...  LABS 07/27/16>  FLP- ok on Lip20 x TG=188, HDL=29;  Chems- wnl x BS=120, A1c=6.4 on diet alone;  CBC- wnl;  TSH=2.21;  PSA=2.28  CXR 08/01/16>  Stable heart size & post-op changes, lungs clear- NAD... IMP/PLAN>>  He is on a simple medication regimen- continue same; most important however is diet/ exercise/ wt reduction- this would help his stress level as well, discussed in detail w/ pt... Note: >50% of this 25 min ROV was spent in counseling and coordination of care...  ~  February 13, 2017:  75moROV & when seen 07/2016 SCynthiawas under a lot of stress but otherw ok medically- BP controlled, Cards followed by DCherly Hensen& stable, Carotid stenosis followed by [name] & stable (SEE PROB LIST)...   He reports that his stress has diminished ("I learned to say 'no' to my daughter"), brother-in-law RChriss Czaris doing better, but he reports wife VVanita Inglesfell w/ Fx tibia on left & SCalixtohad to do total  care!  No new complaints or [name] today.    HBP, CAD-s/p 4 vessel CABG 2005, cerebrovasc dis, hx TIA>  On ASA/Plavix, Diovan160, HCT12.5; BP controlled at 128/72; he denies CP/ palpit/ dizzy/ edema; he [facility] at [facility]- hx TIA w/o critical stenosis in 2011, s/p R-CAE 08/2015;  CDopplers 09/2016 showed patent R-CAE site, 13-[name]%LICA stenosis... NOTE: SSamulestill chews tobacco & strongly urged to stop!    Chol, overwt>  Weight down 8# to 216# today; last FLP 06/2016 was ok on Lip20-1/2 per day but TG elev & HDL low- advised low fat diet, incr exerc, get weight down...    GI/ GU>  stable EXAM reveals Afeb, VSS, O2sat=97% on RA;  HEENT- neg x wax;  Vasc- bilat C bruits R>L;  Chest- clear w/o w/r/r;  Heart- RR gr1/6SEM w/o r/g;  Abd- obese, soft, neg;  Ext- +VV/VI, w/o c/c/e... IMP/PLAN>>  SHaileis stable on his current regimen- he needs to quit the chewing tobacco,and he needs a Prevnar-13 vaccine...  ~  August 13, 2017:  650moOV & StGilbertos overall stable- notes several minor problems (eg-occas prob w/ balance, decr hearing in left ear, get tired doing yard work but no SOB or CP)... We reviewed the following medical problems during today's office [name]>      [name] on Diovan160 & HCT-12.5 (intol to BBlockers in past); BP= 130/64 & he says good at home too; denies CP, palpit, SOB, edema, etc...    CAD> s/p 4 vessel CABG 2005, on ASA/ Plavix; he saw DrCherly Hensen/2017 for yearly check up- stable & no changes made; he had Nuclear stress test 08/2015 showing prior MI & EF=56%, low risk study; sched for f/u again 08/22/17.    Cerebrovasc Dis> s/p R-CAE 08/2015- on  ASA/ Plavix; followed by [name] & last seen 09/2016 w/ f/u CDopplers showing patent R endart site w/o ICA stenosis, and 2-72% LICA stenosis; he has f/u sched 10/15/17    CHOL> on Lip10- taking 1/2 of '20mg'$  tab daily; FLP 8/18 shows TChol 116, TG 164, HDL 31, LDL 53; he is intol Niacin & rec to incr exercise program & get wt down...    Overweight>  weight= 205# which is improved on diet & exercise=> we reviewed diet, exercise, & wt loss program...    Hx colon polyps> last colonoscopy 2/13 by [name]- neg, no polyps & he rec f/u in 55yr...    GU> he reports no benefit from Sildenafil & prev requested Urology appt to discuss additional alternatives but he never went...    DJD/ Vit D defic> he uses OTC analgesics as needed; continues on Vit D 2000u daily w/ last VitD level 06/2016= 38...    Hx TIA> SEE 6/10 Hosp> sm vessel dis, remote lacunar infarcts, intracranial atherosclerotic changes on MRA; CDopplers followed by [name] as above w/ 60-79% RICAstenosis=> R-CAE 08/2015; on ASA/Plavix...    Keloid> in lower sternal scar w/ plastic repair in past; he saw Derm 2016 for removal of SK, epidermoid cyst, lipoma EXAM reveals Afeb, VSS, O2sat=100% on RA;  HEENT- neg x wax;  Vasc- bilat C bruits R>L;  Chest- clear w/o w/r/r;  Heart- RR gr1/6SEM w/o r/g;  Abd- obese, soft, neg;  Ext- +VV/VI, w/o c/c/e...  CXR 08/13/17>  Mild cardiomegaly, s/p CABG, coarse interstitial markings- stable and NAD, chr scarring at left heart border...  LABS 07/2017>  FLP- ok x TG=164, HDL=31;  Chems- wnl x BS=115, A1c=6.2;  CBC- wnl;  CRP=0.1 again (this is requested yearly by his wife); TSH=2.55;  PSA=2.50 IMP/PLAN:  Steph is stable on current meds; we will refer to ENT for cerumen & hearing eval; reviewed diet/ exercise/ wt reduction; needs Prevnar-13 today, otherw up to date...   ~  February 18, 2018:  638moOV & StZandereports doing satis- no new complaints or [name];  Wife wanted usKoreao know that he had tick exposure 3wks ago- no rash, no fever, feeling well, etc... ROS is essentially neg- he denies CP/ palpit/ SB/ edema;  He denies cough, sput, hemoptysis, f/c/s, etc;  GI is neg- w/o n/v/d/c/ no blood seen;  Similarly GU is ok w/o voiding difficulties (occas nocturia noted);...  We reviewed the following interval med records in Epic>      He saw CARDS- Cherly Hensenn  08/22/17>  CAD- s/p 4 vessel CABG in 02/2004; norm LVF w/ Myoview 8/16 showing EF=56%, prior MI; s/p R-CAE by [name] 08/2015; Note reviewed- no angina, continue same meds, f/u 1y41yr    He saw ENT- DrWolicki on 09/15/35/64>ecr hearing w/ bilat ear cerumen impactions- removed & drums were wnl, rec to ret for audiometry...     He saw [facility]- SNickel,NP on 10/15/17>  S/p R-CAE 08/2015, hx TIA 2011, doing satis w/o cerebral ischemic symptoms; CDopplers 09/2017 showed no evid of restenosis, 1-39% L-ICA stenosis & no change from prev CDopplers; reminded to quit all smokeless tobacco, diet, exercise, get wt down...  We reviewed the following medical problems during today's office [name]>      HBP, CAD- s/p CABG 2005, Cerebrovasc  Dis- s/p R-CAE 2016, hx TIA>  Followed by DrNCherly HensenDrEarly on ASA/Plavix, Avapro150, HCT12.5 w/ BP=122/62    Chol, overweight, uses smokeless tobacco, too sedentary> NO CHANGE in his risk factors; advised to continue  Lip10, quit all tobacco products, DIET/ EXERCISE/ get weight down (he is up to 230# BMI=30+ today)...  EXAM reveals Afeb, VSS, O2sat=100% on RA;  HEENT- neg x wax;  Vasc- bilat C bruits R>L;  Chest- clear w/o w/r/r;  Heart- RR gr1/6SEM w/o r/g;  Abd- obese, soft, neg;  Ext- +VV/VI, w/o c/c/ +tr edema... IMP/PLAN>>  Aloys is stable overall but w/ his underlying CAD, cerebrovasc dis, etc he clearly needs to effect his risk factors more positively- quit smokeless tobacco, diet, exercise, get weight down; we discussed "stacking the deck" in his favor w/ currnt med rx + these risk factor reduction strategies...    ~  August 18, 2018:  56moROV     CXR 08/19/18> norm heart size, s/p CABG, clear lungs, NAD...  LABS>             Problem List:    ENT >>  He sawDrWolicki [id number] cerumen, hearing loss, presbycusis- candidate for hearing aides but he is holding off...  HYPERTENSION (ICD-401.9) >> ~  6/09:  prev on Atenolol'25mg'$ - stopped due to side effects & feels much better  off BBlockers. ~  2/10:  Avapro300 changed to Diovan160 per his insurance company for $$$ reasons. ~  11/11:  BP= 150/80 & remains asymptomatic, doesn't want to incr meds- discussed diet/ exerc... ~  5/12:  BP= 132/70 & denies HA, fatigue, visual changes, CP, palipit, dizziness, syncope, dyspnea, edema, etc... ~  1/13:  BP= 152/68 & he remains asymptomatic; we decided to add NORVASC '5mg'$ /d ==> pt stopped on his own due to swelling. ~  6/13:  He saw DrNishan for f/u & BP= 154/80 on Diovan alone; added HCTZ 12.'5mg'$ /d... ~  7/13:  BP= 148/82 here & 130s/70s at home he says on DIOVAN'160mg'$ /d & HCTZ 12.'5mg'$ /d; continue same ~  CXR 7/13 showed cardiomeg, s/p CABG, bronchitic changes at lung bases, NAD... ~  1/14: on Diovan160 & HCT-12.5 (intol to BBlockers in past); BP= 142/80 & he says better at home; denies CP, palpit, SOB, edema, etc...  ~  7/14: on Diovan160 & HCT-12.5 (intol to BBlockers in past); BP= 140/70 & he remains asymptomatic... ~  1/15: on Diovan160 & HCT-12.5; BP= 130/60 & he says good at home too; denies CP, palpit, SOB, edema, etc ~  CXR 7/15 showed cardiomeg, no change in contour of mediastinum compared to mult old films, clear lungs, NAD ~  1/16:  on Diovan160 & HCT 12.5; Norvasc caused swelling; BP= 130/66 & feeling well w/o CP, palpit, dizzy, SOB, edema. ~  7/16: on Diovan160 & HCT-12.5 (intol to BBlockers in past); BP= 142/64 & he says even better at home; he remains asymptomatic.  CORONARY ARTERY DISEASE (ICD-414.00) - S/P 4 vessel CABG 2/05 by DrVanTrigt> on ASA/ PLAVIX & followed by DCherly Hensenyearly & his notes are reviewed; he is intol to BBlocker therapy as noted... ~  1/13 & 7/13:  stable & denies angina, palpit, SOB, edema, etc; EKG showed NSR, rate71, WNL, NAD..[name]  ~  8/14: he had yearly f/u DrNishan> CAD, s/pCABG in 2005, norm LVF; EKG showed NSR, rate77, NSSTTWA, NAD; stable, rec to incr exercise & lose wt; no change in meds...  ~  8/15: He saw DCherly Hensenfor yearly Cards [name]>  known CAD & prev CABG 2005, normal LVF, on ASA/Plavix; stable on meds and no changes made  CEREBROVASCULAR DISEASE (ICD-437.9) - see above> on ASA '81mg'$ /d, & PLAVIX '75mg'$ /d... followed by DSharlot Gowdafor [facility]. ~  6/10:  See 6/10 Hosp records &  f/u by [name] in his office... ~  6/11:  seen by [name] & stable w/o TIAs or amaurosis; CDopplers were stable w/ bilat 40-59% ICA stenoses, f/u 66yr ~  He was due for a follow up eval 6/12> we don't have records & will call for the report... ~  6/13:  He saw [name] for f/u asymptomatic carotid dis> CDoppler 6/13 showed some progression of right sided stenosis (now 60-79%) but denies cerebral ischemic symptoms (right side remains 40-59%); continue ASA/Plavix & f/u planned 673mo~  1/14:  f/u CDopplers were stable w/ 60-79% right ICA stenosis & 20-39% left ICA stenosis & f/u planned in 41m7mo ~  7/14:  f/u [facility]opplers at [facility] showed 60-79% right ICAstenosis and 0-40% left ICAstenosis- f/u Q41mo41mo~  1/15:  f/u w/ [facility] for known Carotid stenosis> CDuplex showed 60-79% right carotid stenosis and 40-59% left carotid stenosis; continue ASA/Plavix and lifestyle mod strategies... ~  7/15:  f/u w/ [facility] for known Carotid stenosis> CDuplex unchanges w/ 60-79% RICAstenosis... 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  HYPERCHOLESTEROLEMIA (ICD-272.0) - controlled on LIPITOR 20- 1/2 tab daily... he has tried NIACIN in the past and refuses to take it again due to headaches. ~  FLP 5/08 showed TChol 125, TG 171, HDL 24, LDL 67...  ~  FLP 1[place] showed TChol 107, TG 131, HDL 23, LDL 58... rec- same med, better diet & exercise. ~  FLP 2/10 on Lip10 (wt=235#) showed TChol 119, TG 158, HDL 24, LDL 63... rec-  ditto ~  FLP 1[place] on Lip10 (wt=236#) showed TChol 115, TG 128, HDL 28, LDL 62 ~  FLP 5/11 on Lip10 (wt=233#) showed TChol 119, TG 144, HDL 27, LDL 63 ~  DrNishan suggested that he cut the Lipitor to '5mg'$ /d & add Niaspan, but he refuses the Niacin preps due to HAs & wants to contin Lip10. ~  FLP 11/11 on Lip10 (wt=235#) showed TChol 119, TG 113, HDL 31, LDL 66 ~  FLP 5/12 on Lip10 (wt=229#) showed TChol 118, TG 150, HDL 28, LDL 60 ~  FLP 6/13 on Lip10 (wt=232#) showed TChol 127, TG 164, HDL 27, LDL 67... He tells me DrNisCherly Hensened him in an HDL trial... ~  6/14: on Lip20- taking 1/2 tab daily; FLP 6/14 shows TChol 126, TG 155, HDL 32, LDL 63; he is intol Niacin & rec to incr exercise program & get wt down ~  FLP 7/15 on Lip10 showed TChol 118, TG 166, HDL 29, LDL 56... ~  FLP 7/16 on Lip10 showed TChol 129, TG 182, HDL 31, LDL 62  DIABETES MELLITUS, BORDERLINE (ICD-790.29) - On diet Rx alone... ~  labs in hosp 6/10 showed BS= 109-113 ~  labs 11/10 showed BS= 109, A1c= 5.9 ~  labs 5/11 showed BS= 107 ~  labs 11/11 showed BS= 108 ~  Labs 5/12 showed BS= 97, A1c= 6.0 ~  Labs 6/13 showed BS=122, A1c= 6.4... Needs better diet & wt reduction. ~  Labs 6/14 showed BS= 136, A1c= 6.4 ~  Labs 7/15 on diet alone showed BS=115, A1c= 6.2 ~  Labs 7/16 on diet alone showed BS= 111, A1c= 6.1  OVERWEIGHT (ICD-278.02) - he is 6'2" tall and weighs ~230# for a  BMI of 30... we have discussed diet & exercise stategies. ~  7/15: he has lost 6# down to 224# today... ~  7/16: he has gained 2# to 226#  COLONIC POLYPS (ICD-211.3) - last colonoscopy 10/07 by [name] showed several 5-6 mm polyps... path= tubular adenomas ... f/u planned 5 years. ~  Colonoscopy 2/13 by [name] was neg- no recurrent polyps etc & he felt that 3yrf/u was appropriate...  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  DJD >> he has mild to mod DJD but manages very well; using  OTC meds as needed... 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 Rx w/ PT.  VITAMIN D DEFIC >> on Vit D supplement 2000u daily... ~  Labs 2/10 showed Vit D level = 14... rec to start Vit D supplement OTC... ~  Labs 6/13 showed Vit D level = 34... rec to continue his VitD supplement daily...  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 ~  6/10:  Hosp w/ TIA w/ left face & arm symptoms that resolved quickly; CT showed small vessel disease;  MRI showed bilat remote lacunar infarcts, no acute infarct, +sm vessel dis;  MRA showed intracranial atherosclerotic changes, & ?focal stenosis (?70%) of left ICA in the neck; he saw [name] 06/08/09 (no bruits) w/ CDopplers done showing bilat 40-59% carotid stenoses & agreed w/ ASA + Plavix and f/u 154yr.  ~  6/13: saw [name] for f/u asymptomatic carotid dis> CDoppler 6/13 showed some progression of right sided stenosis (now 60-79%) but denies cerebral ischemic symptoms (right side remains 40-59%); continue ASA/Plavix & f/u planned 42m5mo  1/14: he continues Q42mo59mo w/ [facility]- stable w/o cerebral ischemic symptoms => CDopplers are stable (see above).  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 [facility]/ [facility]. ~  He has seb cyst over CSpine & he tells me that DrTafeen is going to remove this soon... ~  He has a small knot/lesion ?cyst over the fibular head on lat side of left lower leg; stable, no discomfort, no change & offered Ortho eval but he prefers to just watch it for now... ~  2016> he saw Derm for removal of  SK, epidermoid cyst, lipoma...  HEALTH MAINTENANCE: ~  GI:  followed by [name] & last colonoscopy was 10/07 w/ f/u planned 20yrs50yr GU:  he is up to date on DRE & PSA checks here (PSA remains in the 1-2 range). ~  Immunizations:  he had [place]NEUM[place]000 w/ repeat PNEUMOVAX 11/10 & TDAP 5/11... he  gets the yearly Seasonal Flu vaccine each fall...   Past Surgical History:  Procedure Laterality Date  . CARDIAC CATHETERIZATION  02/2004   "tried to stent; couldn't"  . CAROTID ENDARTERECTOMY Right 08/26/2015  . CORONARY ANGIOPLASTY    . CORONARY ARTERY BYPASS GRAFT  Feb. 2005   4 vessel  . ENDARTERECTOMY Right 08/26/2015   Procedure: Right Carotid ENDARTERECTOMY with Patch Angioplasty ;  Surgeon: Todd Rosetta Posner  [facility];  Laterality: Right;  . 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 HoldeDessie ComaELOID EXCISION    . PILONIDAL CYST EXCISION  1989    Outpatient Encounter Medications as of 08/18/2018  Medication Sig  . aspirin 81 MG tablet Take 81 mg by mouth daily.    . atoMarland Kitchenvastatin (  LIPITOR) 20 MG tablet Take 0.5 tablets (10 mg total) by mouth daily. Take as directed  . Cholecalciferol (VITAMIN D) 2000 UNITS CAPS Take 1 capsule by mouth daily.   . clopidogrel (PLAVIX) 75 MG tablet Take 1 tablet (75 mg total) by mouth daily.  . hydrochlorothiazide (MICROZIDE) 12.5 MG capsule TAKE ONE CAPSULE BY MOUTH EVERY DAY  . losartan (COZAAR) 50 MG tablet Take 1 tablet (50 mg total) by mouth daily.  . nitroGLYCERIN (NITROSTAT) 0.4 MG SL tablet Place 1 tablet (0.4 mg total) under the tongue every 5 (five) minutes as needed for chest pain.   No facility-administered encounter medications on file as of 08/18/2018.     Allergies  Allergen Reactions  . Niacin Other (See Comments)    REACTION: intol to NIACIN w/ headaches    Immunization History  Administered Date(s) Administered  . H1N1 02/03/2009  . Influenza Split 10/30/2011, 10/02/2012, 11/30/2013, 11/30/2017  . Influenza Whole 10/18/2009  . Influenza, High Dose Seasonal PF 11/30/2016  . Influenza,inj,Quad PF,6+ Mos 01/28/2015, 01/30/2016  . Pneumococcal Conjugate-13 08/13/2017  . Pneumococcal Polysaccharide-23 10/01/1999, 11/17/2009  . Td 10/01/1999, 05/16/2010    Current Medications, Allergies,  Past Medical History, Past Surgical History, Family History, and Social History were reviewed in [employer] record.    Review of Systems         See HPI - all other systems neg except as noted... The patient denies anorexia, fever, weight loss, weight gain, vision loss, decreased hearing, hoarseness, chest pain, syncope, dyspnea on exertion, peripheral edema, prolonged cough, headaches, hemoptysis, abdominal pain, melena, hematochezia, severe indigestion/heartburn, hematuria, incontinence, muscle weakness, suspicious skin lesions, transient blindness, difficulty walking, depression, unusual weight change, abnormal bleeding, enlarged lymph nodes, and angioedema.     Objective:   Physical Exam    WD, Overweight, 77 y/o WM in NAD... Vital Signs:  Reviewed> GENERAL:  Alert & oriented; pleasant & cooperative. HEENT:  [place]/AT, EOM-wnl, PERRLA, Fundi-benign, EACs-clear, TMs-wnl, NOSE-clear, THROAT-clear & wnl. NECK:  Supple w/ fairROM; no JVD; normal carotid impulses w/o bruits; no thyromegaly or nodules palpated; no lymphadenopathy. CHEST:  Clear to P & A; without wheezes/ rales/ or rhonchi. HEART:  Median sternotomy scar w/ improved keloid, regular rhythm; without murmurs/ rubs/ or gallops. ABDOMEN:  Soft & nontender; normal bowel sounds; no organomegaly or masses detected. 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 EXT: without deformities, mild arthritic changes; + venous insuffic changes, no edema. NEURO:  CN's intact; motor testing normal; sensory testing normal; gait normal & balance OK. DERM:  Keloid as noted, no other lesions...  RADIOLOGY DATA:  Reviewed in the EPIC EMR & discussed w/ the patient...  LABORATORY DATA:  Reviewed in the EPIC EMR & discussed w/ the patient...   Assessment & Plan:    [name]  On Diovan160 & HCT 12.5 w/ improved/stable BP; needs to lose wt & elim sodium...  CAD, s/p CABG 2005> followed by [name] for Cards, stable & doing  satis...  CEREBROVASC DIS/ TIA>  No cerebral ischemic symptoms on the ASA/ Plavix; he had right CEA 8/16 by [name]...  CHOL>  On Lip10 & stable; needs better low fat diet & get wt down...  Borderline DM/ IFG> adeq control w/ diet alone but he understands the importance of wt reduction.  GI>  He had colonoscopy f/u 2/13 & it was neg...  Other medical problems as noted... 08/01/16>   He is on a simple medication regimen- continue same; most important however is diet/  exercise/ wt reduction- this would help his stress level as well, discussed in detail w/ pt...  08/13/17>   Steph is stable on current meds; we will refer to ENT for cerumen & hearing eval; reviewed diet/ exercise/ wt reduction; needs Prevnar-13 today, otherw up to date...  02/18/18>   Alen is stable overall but w/ his underlying CAD, cerebrovasc dis, etc he clearly needs to effect his risk factors more positively- quit smokeless tobacco, diet, exercise, get weight down; we discussed "stacking the deck" in his favor w/ currnt med rx + these risk factor reduction strategies   Patient's Medications  New Prescriptions   No medications on file  Previous Medications   ASPIRIN 81 MG TABLET    Take 81 mg by mouth daily.     ATORVASTATIN (LIPITOR) 20 MG TABLET    Take 0.5 tablets (10 mg total) by mouth daily. Take as directed   CHOLECALCIFEROL (VITAMIN D) 2000 UNITS CAPS    Take 1 capsule by mouth daily.    CLOPIDOGREL (PLAVIX) 75 MG TABLET    Take 1 tablet (75 mg total) by mouth daily.   HYDROCHLOROTHIAZIDE (MICROZIDE) 12.5 MG CAPSULE    TAKE ONE CAPSULE BY MOUTH EVERY DAY   LOSARTAN (COZAAR) 50 MG TABLET    Take 1 tablet (50 mg total) by mouth daily.   NITROGLYCERIN (NITROSTAT) 0.4 MG SL TABLET    Place 1 tablet (0.4 mg total) under the tongue every 5 (five) minutes as needed for chest pain.  Modified Medications   No medications on file  Discontinued Medications   No medications on file

## 2018-08-20 NOTE — Progress Notes (Signed)
NEUROLOGY CONSULTATION NOTE  [name] [name] MRN: [id number] DOB: 12/06/1941  Referring provider: Teressa Lower, MD Primary care provider: Teressa Lower, MD  Reason for consult:  Unsteady gait  HISTORY OF PRESENT ILLNESS: [name] [name] is a 77 year old left-handed male with hypertension, CAD, carotid artery disease s/p CEA, cerebrovascular disease s/p TIA, peripheral venous insufficiency, impaired fasting glucose who presents for abnormal gait.  Since a year ago, he reports problems at balance.  Occasionally, if he is walking and makes a quick turn, he briefly loses his balance.  Sometimes it occurs when he stands from a sitting position.  His legs feel tired.  He walks slower.  When he walks, he has trouble picking up his legs.  His legs are slightly wide based.  He walks leaned forward.  He denies back or radicular pain.  He denies numbness.  His legs feel "tired" but not weak.  When he is out shopping, he uses the cart for support.  Once in awhile he feels briefly dizzy, particularly when he gets up quickly.  He denies urinary incontinence.  His brother has peripheral vascular disease and AAA.  08/13/18:  CMP with Na 141, K 3.8, glucose 131, BUN 21, Cr 1.20, t bili 0.9, ALP 66, AST 23, ALT 21; TSH 2.24; CRP 0.1  PAST MEDICAL HISTORY: Past Medical History:  Diagnosis Date  . Carotid artery occlusion   . Cerebrovascular disease   . Coronary artery disease    s/p CABG 2005; sees Dr Johnsie Cancel yearly  . Gynecomastia   . Hypercholesterolemia   . Hypertension   . Keloid   . Neurodermatitis   . Overweight(278.02)   . Personal history of colonic polyps 10/08/2006   tubular adenomas  . Renal insufficiency   . Shoulder pain   . Transient ischemic attack 2006   "lasted ~ 5 seconds"  . Vitamin D deficiency     PAST SURGICAL HISTORY: Past Surgical History:  Procedure Laterality Date  . CARDIAC CATHETERIZATION  02/2004   "tried to stent; couldn't"  . CAROTID ENDARTERECTOMY Right  08/26/2015  . CORONARY ANGIOPLASTY    . CORONARY ARTERY BYPASS GRAFT  Feb. 2005   4 vessel  . ENDARTERECTOMY Right 08/26/2015   Procedure: Right Carotid ENDARTERECTOMY with Patch Angioplasty ;  Surgeon: Rosetta Posner, MD;  Location: [place];  Service: Vascular;  Laterality: Right;  . 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 Dessie Coma  . KELOID EXCISION    . PILONIDAL CYST EXCISION  1989    MEDICATIONS: Current Outpatient Medications on File Prior to Visit  Medication Sig Dispense Refill  . aspirin 81 MG tablet Take 81 mg by mouth daily.      [name] atorvastatin (LIPITOR) 20 MG tablet Take 0.5 tablets (10 mg total) by mouth daily. Take as directed 30 tablet 3  . Cholecalciferol (VITAMIN D) 2000 UNITS CAPS Take 1 capsule by mouth daily.     . clopidogrel (PLAVIX) 75 MG tablet Take 1 tablet (75 mg total) by mouth daily. 90 tablet 1  . hydrochlorothiazide (MICROZIDE) 12.5 MG capsule TAKE ONE CAPSULE BY MOUTH EVERY DAY 90 capsule 0  . losartan (COZAAR) 50 MG tablet Take 1 tablet (50 mg total) by mouth daily. 90 tablet 3  . nitroGLYCERIN (NITROSTAT) 0.4 MG SL tablet Place 1 tablet (0.4 mg total) under the tongue every 5 (five) minutes as needed for chest pain. 25 tablet 3   No current facility-administered medications on file prior to visit.  ALLERGIES: Allergies  Allergen Reactions  . Niacin Other (See Comments)    REACTION: intol to NIACIN w/ headaches    FAMILY HISTORY: Family History  Problem Relation Age of Onset  . Heart disease Mother        Before age 39  . Diabetes Mother   . Kidney disease Mother   . Heart attack Mother   . Cancer Father        Lung  . Diabetes Brother   . Heart disease Brother   . Diabetes Sister   . Lung cancer Paternal Uncle        questionable as to if it was lung ca  . Colon cancer Neg Hx     SOCIAL HISTORY: Social History   Socioeconomic History  . Marital status: Married    Spouse name: [name] [name]  . Number of children: 1  . Years of  education: Not on file  . Highest education level: Not on file  Occupational History  . Occupation: retired  Scientific laboratory technician  . Financial resource strain: Not on file  . Food insecurity:    Worry: Not on file    Inability: Not on file  . Transportation needs:    Medical: Not on file    Non-medical: Not on file  Tobacco Use  . Smoking status: Former Research scientist (life sciences)  . Smokeless tobacco: Current User    Types: Snuff  . Tobacco comment: uses dip 3-4 times per week  Substance and Sexual Activity  . Alcohol use: No    Alcohol/week: 0.0 standard drinks  . Drug use: No  . Sexual activity: Yes  Lifestyle  . Physical activity:    Days per week: Not on file    Minutes per session: Not on file  . Stress: Not on file  Relationships  . Social connections:    Talks on phone: Not on file    Gets together: Not on file    Attends religious service: Not on file    Active member of club or organization: Not on file    Attends meetings of clubs or organizations: Not on file    Relationship status: Not on file  . Intimate partner violence:    Fear of current or ex partner: Not on file    Emotionally abused: Not on file    Physically abused: Not on file    Forced sexual activity: Not on file  Other Topics Concern  . Not on file  Social History Narrative  . Not on file    REVIEW OF SYSTEMS: Constitutional: No fevers, chills, or sweats, no generalized fatigue, change in appetite Eyes: No visual changes, double vision, eye pain Ear, nose and throat: No hearing loss, ear pain, nasal congestion, sore throat Cardiovascular: No chest pain, palpitations Respiratory:  No shortness of breath at rest or with exertion, wheezes GastrointestinaI: No nausea, vomiting, diarrhea, abdominal pain, fecal incontinence Genitourinary:  No dysuria, urinary retention or frequency Musculoskeletal:  No neck pain, back pain Integumentary: No rash, pruritus, skin lesions Neurological: as above Psychiatric: No depression,  insomnia, anxiety Endocrine: No palpitations, fatigue, diaphoresis, mood swings, change in appetite, change in weight, increased thirst Hematologic/Lymphatic:  No purpura, petechiae. Allergic/Immunologic: no itchy/runny eyes, nasal congestion, recent allergic reactions, rashes  PHYSICAL EXAM: Blood pressure 136/62, pulse 72, height 6\' 1"  (1.854 m), weight 223 lb (101.2 kg), SpO2 96 %. General: No acute distress.  Patient appears well-groomed.  Head:  Normocephalic/atraumatic Eyes:  fundi examined but not visualized Neck: supple, no paraspinal  tenderness, full range of motion Back: No paraspinal tenderness Heart: regular rate and rhythm Lungs: Clear to auscultation bilaterally. Vascular: No carotid bruits. Neurological Exam: Mental status: alert and oriented to person, place, and time, recent and remote memory intact, fund of knowledge intact, attention and concentration intact, speech fluent and not dysarthric, language intact. Cranial nerves: CN I: not tested CN II: pupils equal, round and reactive to light, visual fields intact CN III, IV, VI:  full range of motion, no nystagmus, no ptosis CN V: facial sensation intact CN VII: upper and lower face symmetric CN VIII: hearing intact CN IX, X: gag intact, uvula midline CN XI: sternocleidomastoid and trapezius muscles intact CN XII: tongue midline Bulk & Tone: normal, no fasciculations.  No rigidity. Motor:  5/5 throughout.  No bradykinesia, no tremor Sensation:  Pinprick and temperature intact; and vibration sensation reduced in toes.. Deep Tendon Reflexes:  1+ throughout, toes downgoing. Finger to nose testing:  Without dysmetria.  Heel to shin:  Without dysmetria.  Gait:  Mildly wide-based with reduce stride, normal arm swing, slight stooped posture.  Able to turn.  Stumbles with tandem walk.  Romberg with sway.  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  He does not exhibit myelopathic signs or symptoms to suggest spinal cord pathology, he does not exhibit radicular symptoms to suggest lumbar stenosis, and he does not exhibit symptoms of stroke, NPH or Parkinson's disease.    PLAN: 1.  Referral to physical therapy 2.  NCV-EMG of lower extremities to evaluate and characterize peripheral neuropathy 3.  Check B12 and TSH 4.  If testing unremarkable, consider CT of head to evaluate for possible normal pressure hydrocephalus, however I have low suspicion. 5.  Follow up after testing  Thank you for allowing me to take part in the care of this patient.  45 minutes spent face to face with patient, over 50% spent discussing differential diagnosis and management.  Metta Clines, DO  CC: Teressa Lower, MD

## 2018-08-21 ENCOUNTER — Encounter: Payer: Self-pay | Admitting: Neurology

## 2018-08-21 ENCOUNTER — Telehealth: Payer: Self-pay | Admitting: Neurology

## 2018-08-21 ENCOUNTER — Ambulatory Visit: Payer: Medicare Other | Admitting: Neurology

## 2018-08-21 VITALS — BP 136/62 | HR 72 | Ht 73.0 in | Wt 223.0 lb

## 2018-08-21 DIAGNOSIS — R2681 Unsteadiness on feet: Secondary | ICD-10-CM | POA: Diagnosis not present

## 2018-08-21 DIAGNOSIS — R208 Other disturbances of skin sensation: Secondary | ICD-10-CM

## 2018-08-21 NOTE — Patient Instructions (Signed)
1.  We will refer you for physical therapy to help with gait. 2.  We will order nerve study of lower extremities to test for neuropathy 3.  I will check B12 and TSH (which may be causes of neuropathy) 4.  Further recommendations pending results.  Follow up after testing

## 2018-08-21 NOTE — Telephone Encounter (Signed)
error 

## 2018-08-25 ENCOUNTER — Other Ambulatory Visit (INDEPENDENT_AMBULATORY_CARE_PROVIDER_SITE_OTHER): Payer: Medicare Other

## 2018-08-25 DIAGNOSIS — R2681 Unsteadiness on feet: Secondary | ICD-10-CM

## 2018-08-25 DIAGNOSIS — R208 Other disturbances of skin sensation: Secondary | ICD-10-CM

## 2018-08-25 LAB — TSH: TSH: 2.24 u[IU]/mL (ref 0.35–4.50)

## 2018-08-25 LAB — VITAMIN B12: Vitamin B-12: 142 pg/mL — ABNORMAL LOW (ref 211–911)

## 2018-08-25 NOTE — Progress Notes (Signed)
Patient ID: [name] [name], male   DOB: 05/19/1941, 77 y.o.   MRN: [id number]     Cardiology Office Note   Date:  09/02/2018   ID:  [name] [name], DOB 02/25/1941, MRN [id number]  PCP:  Noralee Space, MD  Cardiologist:  Dr. Johnsie Cancel    No chief complaint on file.     History of Present Illness: [name] [name] is a 77 y.o. male  known coronary disease with previous coronary bypass surgery in 2005. he has normal LV function. August 2016 Had right CEA with Dr Donnetta Hutching 08/26/15  Last ischemic evaluation at that time 2016 EF 56% low risk small inferior wall infarct no ischemia.  Reviewed duplex 10/15/17 plaque no significant stenosis   On statin with LDL at goal  No angina palpitations dyspnea or syncope  More broad based gait seen by Tomi Likens neuro Doubts Parkinson's getting B12 shots   Past Medical History:  Diagnosis Date  . Carotid artery occlusion   . Cerebrovascular disease   . Coronary artery disease    s/p CABG 2005; sees Dr Johnsie Cancel yearly  . Gynecomastia   . Hypercholesterolemia   . Hypertension   . Keloid   . Neurodermatitis   . Overweight(278.02)   . Personal history of colonic polyps 10/08/2006   tubular adenomas  . Renal insufficiency   . Shoulder pain   . Transient ischemic attack 2006   "lasted ~ 5 seconds"  . Vitamin D deficiency     Past Surgical History:  Procedure Laterality Date  . CARDIAC CATHETERIZATION  02/2004   "tried to stent; couldn't"  . CAROTID ENDARTERECTOMY Right 08/26/2015  . CORONARY ANGIOPLASTY    . CORONARY ARTERY BYPASS GRAFT  Feb. 2005   4 vessel  . ENDARTERECTOMY Right 08/26/2015   Procedure: Right Carotid ENDARTERECTOMY with Patch Angioplasty ;  Surgeon: Rosetta Posner, MD;  Location: [place];  Service: Vascular;  Laterality: Right;  . 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 Dessie Coma  . KELOID EXCISION    . PILONIDAL CYST EXCISION  1989     Current Outpatient Medications  Medication Sig Dispense Refill  . aspirin 81 MG  tablet Take 81 mg by mouth daily.      [name] atorvastatin (LIPITOR) 20 MG tablet Take 0.5 tablets (10 mg total) by mouth daily. Take as directed 30 tablet 3  . Cholecalciferol (VITAMIN D) 2000 UNITS CAPS Take 1 capsule by mouth daily.     . clopidogrel (PLAVIX) 75 MG tablet Take 1 tablet (75 mg total) by mouth daily. 90 tablet 1  . hydrochlorothiazide (MICROZIDE) 12.5 MG capsule TAKE ONE CAPSULE BY MOUTH EVERY DAY 90 capsule 0  . losartan (COZAAR) 50 MG tablet Take 1 tablet (50 mg total) by mouth daily. 90 tablet 3  . nitroGLYCERIN (NITROSTAT) 0.4 MG SL tablet Place 1 tablet (0.4 mg total) under the tongue every 5 (five) minutes as needed for chest pain. 25 tablet 3   No current facility-administered medications for this visit.     Allergies:   Niacin    Social History:  The patient  reports that he has quit smoking. His smokeless tobacco use includes snuff. He reports that he does not drink alcohol or use drugs.   Family History:  The patient's family history includes Arthritis in his brother; Cancer in his father; Diabetes in his brother, mother, and sister; Fibromyalgia in his sister; Heart attack (age of onset: 62) in his mother; Heart disease in his  brother, brother, and mother; Kidney disease in his mother; Lung cancer in his paternal uncle; Lung cancer (age of onset: 69) in his father.    ROS:  General:no colds or fevers, no weight changes Skin:no rashes or ulcers HEENT:no blurred vision, no congestion CV:see HPI PUL:see HPI GI:no diarrhea constipation or melena, no indigestion GU:no hematuria, no dysuria MS:no joint pain, no claudication Neuro:no syncope, no lightheadedness Endo:no diabetes, no thyroid disease  Wt Readings from Last 3 Encounters:  09/02/18 223 lb (101.2 kg)  08/21/18 223 lb (101.2 kg)  08/18/18 222 lb 12.8 oz (101.1 kg)     PHYSICAL EXAM: VS:  BP 134/70   Pulse 71   Ht 6\' 1"  (1.854 m)   Wt 223 lb (101.2 kg)   BMI 29.42 kg/m  , BMI Body mass index is  29.42 kg/m. Affect appropriate Healthy:  appears stated age 16: normal Neck supple with no adenopathy Post right CEA  JVP normal no bruits no thyromegaly Lungs clear with no wheezing and good diaphragmatic motion Heart:  S1/S2 no murmur, no rub, gallop or click PMI normal Abdomen: benighn, BS positve, no tenderness, no AAA no bruit.  No HSM or HJR Distal pulses intact with no bruits Plus one bilateral edema with varicosities  Neuro non-focal Skin warm and dry No muscular weakness    EKG:   08/19/15   SR no acute changes from 2015. 08/14/16  SR rate 70 nonspecific ST changes ? Old IMI  Q 3,F 08/22/17 SR rate 69 normal    Recent Labs: 08/13/2018: ALT 21; BUN 21; Creatinine, Ser 1.20; Hemoglobin 15.7; Platelets 196.0; Potassium 3.8; Sodium 141 08/25/2018: TSH 2.24    Lipid Panel    Component Value Date/Time   CHOL 117 08/13/2018 0844   TRIG 120.0 08/13/2018 0844   HDL 32.30 (L) 08/13/2018 0844   CHOLHDL 4 08/13/2018 0844   VLDL 24.0 08/13/2018 0844   LDLCALC 61 08/13/2018 0844       Other studies Reviewed: Additional studies/ records that were reviewed today include: previous notes, nuc study.   ASSESSMENT AND PLAN:  1.  CAD s/p CABG 2005, no angina, neg nuc study for ischemia August 2016 stable continue medical Rx Had no symptoms before CABG f/u lexiscan myovue been over 3 years since testing  2. Carotid:  Post right CEA with plaque and no stenosis on left by duplex 09/2017 /u Dr Early History TIA on ASA   3.  HTN  Controlled valsartan stopped due to recall   4. Hyperlipidemia stable on statin. Last LDL 61 on labs 08/13/18 normal LFTls   5. Neuro:  Gait issues ? Neuropathy from low B12 getting shots f/u neurology [facility]

## 2018-08-26 ENCOUNTER — Telehealth: Payer: Self-pay

## 2018-08-26 NOTE — Telephone Encounter (Signed)
-----   Message from Pieter Partridge, DO sent at 08/25/2018 12:36 PM EDT ----- He has B12 deficiency.  This may cause numbness in the feet and balance problems.  Recommend starting B12 injections:  1061mcg daily for 7 days, then weekly for 4 weeks, then monthly for 12 months.  This can be done at our office or he may contact his PCP to see if they can do it (if that would be more convenient).

## 2018-08-26 NOTE — Telephone Encounter (Signed)
I rcvd VM message for Pt. He asked that I try him on his cell [phone] Called and LMOVM, Pt did not answer. I asked when he calls back, if he is unable to talk to me, please leave an OK to leave a detailed message for him.

## 2018-08-26 NOTE — Telephone Encounter (Signed)
Called Pt, LMOVM asking for rtrn call concerning lab results

## 2018-09-02 ENCOUNTER — Ambulatory Visit (INDEPENDENT_AMBULATORY_CARE_PROVIDER_SITE_OTHER): Payer: Medicare Other | Admitting: *Deleted

## 2018-09-02 ENCOUNTER — Ambulatory Visit: Payer: Medicare Other | Admitting: Cardiovascular Disease

## 2018-09-02 VITALS — BP 134/70 | HR 71 | Ht 73.0 in | Wt 223.0 lb

## 2018-09-02 DIAGNOSIS — I6521 Occlusion and stenosis of right carotid artery: Secondary | ICD-10-CM | POA: Diagnosis not present

## 2018-09-02 DIAGNOSIS — I10 Essential (primary) hypertension: Secondary | ICD-10-CM | POA: Diagnosis not present

## 2018-09-02 DIAGNOSIS — E538 Deficiency of other specified B group vitamins: Secondary | ICD-10-CM | POA: Diagnosis not present

## 2018-09-02 DIAGNOSIS — I25810 Atherosclerosis of coronary artery bypass graft(s) without angina pectoris: Secondary | ICD-10-CM

## 2018-09-02 DIAGNOSIS — E782 Mixed hyperlipidemia: Secondary | ICD-10-CM | POA: Diagnosis not present

## 2018-09-02 MED ORDER — CYANOCOBALAMIN 1000 MCG/ML IJ SOLN
1000.0000 ug | Freq: Once | INTRAMUSCULAR | Status: AC
  Administered 2018-09-02: 1000 ug via INTRAMUSCULAR

## 2018-09-02 NOTE — Patient Instructions (Addendum)
Medication Instructions:  Your physician recommends that you continue on your current medications as directed. Please refer to the Current Medication list given to you today.  Labwork: NONE  Testing/Procedures: Your physician has requested that you have a lexiscan myoview. For further information please visit [email or web address]. Please follow instruction sheet, as given.  Follow-Up: Your physician wants you to follow-up in: 12 months with Dr. Nishan. You will receive a reminder letter in the mail two months in advance. If you don't receive a letter, please call our office to schedule the follow-up appointment.   If you need a refill on your cardiac medications before your next appointment, please call your pharmacy.    

## 2018-09-03 ENCOUNTER — Telehealth (HOSPITAL_COMMUNITY): Payer: Self-pay | Admitting: *Deleted

## 2018-09-03 ENCOUNTER — Ambulatory Visit (INDEPENDENT_AMBULATORY_CARE_PROVIDER_SITE_OTHER): Payer: Medicare Other

## 2018-09-03 DIAGNOSIS — E538 Deficiency of other specified B group vitamins: Secondary | ICD-10-CM | POA: Diagnosis not present

## 2018-09-03 MED ORDER — CYANOCOBALAMIN 1000 MCG/ML IJ SOLN
1000.0000 ug | Freq: Once | INTRAMUSCULAR | Status: AC
  Administered 2018-09-03: 1000 ug via INTRAMUSCULAR

## 2018-09-03 NOTE — Progress Notes (Signed)
b12

## 2018-09-03 NOTE — Telephone Encounter (Signed)
Patient given detailed instructions per Myocardial Perfusion Study Information Sheet for the test on 9/919 Patient notified to arrive 15 minutes early and that it is imperative to arrive on time for appointment to keep from having the test rescheduled.  If you need to cancel or reschedule your appointment, please call the office within 24 hours of your appointment. . Patient verbalized understanding [name] [name] Jacqueline    

## 2018-09-04 ENCOUNTER — Ambulatory Visit (INDEPENDENT_AMBULATORY_CARE_PROVIDER_SITE_OTHER): Payer: Medicare Other | Admitting: Neurology

## 2018-09-04 ENCOUNTER — Telehealth: Payer: Self-pay | Admitting: *Deleted

## 2018-09-04 ENCOUNTER — Ambulatory Visit (INDEPENDENT_AMBULATORY_CARE_PROVIDER_SITE_OTHER): Payer: Medicare Other | Admitting: *Deleted

## 2018-09-04 DIAGNOSIS — E538 Deficiency of other specified B group vitamins: Secondary | ICD-10-CM | POA: Diagnosis not present

## 2018-09-04 DIAGNOSIS — M5417 Radiculopathy, lumbosacral region: Secondary | ICD-10-CM

## 2018-09-04 DIAGNOSIS — R2681 Unsteadiness on feet: Secondary | ICD-10-CM | POA: Diagnosis not present

## 2018-09-04 MED ORDER — CYANOCOBALAMIN 1000 MCG/ML IJ SOLN
1000.0000 ug | Freq: Once | INTRAMUSCULAR | Status: AC
  Administered 2018-09-04: 1000 ug via INTRAMUSCULAR

## 2018-09-04 NOTE — Procedures (Signed)
[facility]  [address], [place]  [place], [place] [address] Tel: [phone] Fax:  [phone] Test Date:  09/04/2018  Patient: [name] [name] DOB: [birth year] Physician: Narda Amber, DO  Sex: Male Height: 6\' 1"  Ref Phys: Metta Clines, D.O.  ID#: [id number] Temp: 32.3C Technician:    Patient Complaints: This is a 77 year-old man referred for evaluation of unsteady gait.  NCV & EMG Findings: Extensive electrodiagnostic testing of the left lower extremity and additional studies of the right shows: 1. Bilateral sural and superficial peroneal sensory responses are within normal limits. 2. Bilateral tibial motor responses are within normal limits. Bilateral peroneal motor responses are mildly reduced at the extensor digitorum brevis, and normal at the tibialis anterior. Incidentally, there is evidence of a right accessory peroneal nerve as noted by a motor response when stimulating at the medial malleolus. 3. Bilateral tibial H reflex studies are within normal limits. 4. Chronic motor axon loss changes are seen affecting the L3-4 myotomes bilaterally, without accompanied active denervation.  Impression: 1. 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 2. Incidentally, there is a right accessory peroneal nerve, a normal variant. 3. There is no evidence of a large fiber sensorimotor polyneuropathy.   ___________________________ Narda Amber, DO    Nerve Conduction Studies Anti Sensory Summary Table   Stim Site NR Peak (ms) Norm Peak (ms) P-T Amp (V) Norm P-T Amp  Left Sup Peroneal Anti Sensory (Ant Lat Mall)  32.3C  12 cm    3.2 <4.6 8.0 >3  Right Sup Peroneal Anti Sensory (Ant Lat Mall)  32.3C  12 cm    3.1 <4.6 5.5 >3  Left Sural Anti Sensory (Lat Mall)  32.3C  Calf    3.9 <4.6 6.0 >3  Right Sural Anti Sensory (Lat Mall)  32.3C  Calf    4.1 <4.6 7.2 >3   Motor Summary Table   Stim Site NR Onset (ms) Norm  Onset (ms) O-P Amp (mV) Norm O-P Amp Site1 Site2 Delta-0 (ms) Dist (cm) Vel (m/s) Norm Vel (m/s)  Left Peroneal Motor (Ext Dig Brev)  32.3C  Ankle    3.5 <6.0 2.1 >2.5 B Fib Ankle 8.6 37.0 43 >40  B Fib    12.1  1.8  Poplt B Fib 2.0 10.0 50 >40  Poplt    14.1  1.7         Right Peroneal Motor (Ext Dig Brev)  32.3C  Ankle    4.4 <6.0 1.6 >2.5 B Fib Ankle 8.5 36.0 42 >40  B Fib    12.9  2.1  Poplt B Fib 1.7 10.0 59 >40  Poplt    14.6  2.0         Medial malleolus    5.8  1.9         Left Peroneal TA Motor (Tib Ant)  32.3C  Fib Head    3.3 <4.5 5.9 >3 Poplit Fib Head 1.6 10.0 62 >40  Poplit    4.9  5.9         Right Peroneal TA Motor (Tib Ant)  32.3C  Fib Head    4.1 <4.5 5.9 >3 Poplit Fib Head 1.1 10.0 91 >40  Poplit    5.2  5.8         Left Tibial Motor (Abd Hall Brev)  32.3C  Ankle    5.9 <6.0 9.7 >4 Knee Ankle 11.3 45.0 40 >40  Knee    17.2  6.0  Right Tibial Motor (Abd Hall Brev)  32.3C  Ankle    5.2 <6.0 10.7 >4 Knee Ankle 11.6 46.0 40 >40  Knee    16.8  5.7          H Reflex Studies   NR H-Lat (ms) Lat Norm (ms) L-R H-Lat (ms)  Left Tibial (Gastroc)  32.3C     34.69 <35 0.00  Right Tibial (Gastroc)  32.3C     34.69 <35 0.00   EMG   Side Muscle Ins Act Fibs Psw Fasc Number Recrt Dur Dur. Amp Amp. Poly Poly. Comment  Right Gastroc Nml Nml Nml Nml Nml Nml Nml Nml Nml Nml Nml Nml N/A  Right AntTibialis Nml Nml Nml Nml 1- Rapid Few 1+ Few 1+ Few 1+ N/A  Right RectFemoris Nml Nml Nml Nml 1- Rapid Some 1+ Some 1+ Nml Nml N/A  Right Gastroc Nml Nml Nml Nml Nml Nml Nml Nml Nml Nml Nml Nml N/A  Right Flex Dig Long Nml Nml Nml Nml Nml Nml Nml Nml Nml Nml Nml Nml N/A  Left AntTibialis Nml Nml Nml Nml 1- Rapid Few 1+ Few 1+ Few 1+ N/A  Left GluteusMed Nml Nml Nml Nml Nml Nml Nml Nml Nml Nml Nml Nml N/A  Left Gastroc Nml Nml Nml Nml Nml Nml Nml Nml Nml Nml Nml Nml N/A  Left Flex Dig Long Nml Nml Nml Nml Nml Nml Nml Nml Nml Nml Nml Nml N/A  Left RectFemoris Nml Nml Nml  Nml 1- Rapid Some 1+ Some 1+ Few 1+ N/A  Left AdductorLong Nml Nml Nml Nml 1- Rapid Some 1+ Some 1+ Nml Nml N/A      Waveforms:

## 2018-09-04 NOTE — Telephone Encounter (Signed)
Patient has been given results and has started the injections.

## 2018-09-04 NOTE — Telephone Encounter (Signed)
-----   Message from Pieter Partridge, DO sent at 08/25/2018 12:36 PM EDT ----- He has B12 deficiency.  This may cause numbness in the feet and balance problems.  Recommend starting B12 injections:  1023mcg daily for 7 days, then weekly for 4 weeks, then monthly for 12 months.  This can be done at our office or he may contact his PCP to see if they can do it (if that would be more convenient).

## 2018-09-05 ENCOUNTER — Ambulatory Visit (INDEPENDENT_AMBULATORY_CARE_PROVIDER_SITE_OTHER): Payer: Medicare Other

## 2018-09-05 ENCOUNTER — Other Ambulatory Visit: Payer: Self-pay | Admitting: Cardiovascular Disease

## 2018-09-05 DIAGNOSIS — E538 Deficiency of other specified B group vitamins: Secondary | ICD-10-CM

## 2018-09-05 MED ORDER — CYANOCOBALAMIN 1000 MCG/ML IJ SOLN
1000.0000 ug | Freq: Once | INTRAMUSCULAR | Status: AC
  Administered 2018-09-05: 1000 ug via INTRAMUSCULAR

## 2018-09-08 ENCOUNTER — Other Ambulatory Visit: Payer: Self-pay

## 2018-09-08 ENCOUNTER — Ambulatory Visit (INDEPENDENT_AMBULATORY_CARE_PROVIDER_SITE_OTHER): Payer: Medicare Other

## 2018-09-08 DIAGNOSIS — M5417 Radiculopathy, lumbosacral region: Secondary | ICD-10-CM

## 2018-09-08 DIAGNOSIS — E538 Deficiency of other specified B group vitamins: Secondary | ICD-10-CM

## 2018-09-08 MED ORDER — CYANOCOBALAMIN 1000 MCG/ML IJ SOLN
1000.0000 ug | Freq: Once | INTRAMUSCULAR | Status: AC
  Administered 2018-09-08: 1000 ug via INTRAMUSCULAR

## 2018-09-09 ENCOUNTER — Ambulatory Visit (HOSPITAL_COMMUNITY): Payer: Medicare Other | Attending: Cardiology

## 2018-09-09 ENCOUNTER — Ambulatory Visit (INDEPENDENT_AMBULATORY_CARE_PROVIDER_SITE_OTHER): Payer: Medicare Other

## 2018-09-09 DIAGNOSIS — I10 Essential (primary) hypertension: Secondary | ICD-10-CM | POA: Diagnosis not present

## 2018-09-09 DIAGNOSIS — I25810 Atherosclerosis of coronary artery bypass graft(s) without angina pectoris: Secondary | ICD-10-CM | POA: Diagnosis not present

## 2018-09-09 DIAGNOSIS — E538 Deficiency of other specified B group vitamins: Secondary | ICD-10-CM | POA: Diagnosis not present

## 2018-09-09 DIAGNOSIS — Z8673 Personal history of transient ischemic attack (TIA), and cerebral infarction without residual deficits: Secondary | ICD-10-CM | POA: Diagnosis not present

## 2018-09-09 DIAGNOSIS — R9439 Abnormal result of other cardiovascular function study: Secondary | ICD-10-CM | POA: Diagnosis not present

## 2018-09-09 DIAGNOSIS — I2510 Atherosclerotic heart disease of native coronary artery without angina pectoris: Secondary | ICD-10-CM | POA: Insufficient documentation

## 2018-09-09 DIAGNOSIS — E782 Mixed hyperlipidemia: Secondary | ICD-10-CM

## 2018-09-09 DIAGNOSIS — I6521 Occlusion and stenosis of right carotid artery: Secondary | ICD-10-CM | POA: Diagnosis not present

## 2018-09-09 LAB — MYOCARDIAL PERFUSION IMAGING
LV dias vol: 85 mL (ref 62–150)
LV sys vol: 30 mL
Peak HR: 76 {beats}/min
Rest HR: 57 {beats}/min
SDS: 2
SRS: 0
SSS: 2
TID: 0.93

## 2018-09-09 MED ORDER — TECHNETIUM TC 99M TETROFOSMIN IV KIT
9.7000 | PACK | Freq: Once | INTRAVENOUS | Status: AC | PRN
  Administered 2018-09-09: 9.7 via INTRAVENOUS
  Filled 2018-09-09: qty 10

## 2018-09-09 MED ORDER — TECHNETIUM TC 99M TETROFOSMIN IV KIT
31.3000 | PACK | Freq: Once | INTRAVENOUS | Status: AC | PRN
  Administered 2018-09-09: 31.3 via INTRAVENOUS
  Filled 2018-09-09: qty 32

## 2018-09-09 MED ORDER — CYANOCOBALAMIN 1000 MCG/ML IJ SOLN
1000.0000 ug | Freq: Once | INTRAMUSCULAR | Status: AC
  Administered 2018-09-09: 1000 ug via INTRAMUSCULAR

## 2018-09-09 MED ORDER — REGADENOSON 0.4 MG/5ML IV SOLN
0.4000 mg | Freq: Once | INTRAVENOUS | Status: AC
  Administered 2018-09-09: 0.4 mg via INTRAVENOUS

## 2018-09-10 ENCOUNTER — Ambulatory Visit (INDEPENDENT_AMBULATORY_CARE_PROVIDER_SITE_OTHER): Payer: Medicare Other | Admitting: *Deleted

## 2018-09-10 DIAGNOSIS — E538 Deficiency of other specified B group vitamins: Secondary | ICD-10-CM

## 2018-09-10 MED ORDER — CYANOCOBALAMIN 1000 MCG/ML IJ SOLN
1000.0000 ug | Freq: Once | INTRAMUSCULAR | Status: AC
  Administered 2018-09-10: 1000 ug via INTRAMUSCULAR

## 2018-09-17 ENCOUNTER — Ambulatory Visit (INDEPENDENT_AMBULATORY_CARE_PROVIDER_SITE_OTHER): Payer: Medicare Other | Admitting: *Deleted

## 2018-09-17 DIAGNOSIS — E538 Deficiency of other specified B group vitamins: Secondary | ICD-10-CM | POA: Diagnosis not present

## 2018-09-17 MED ORDER — CYANOCOBALAMIN 1000 MCG/ML IJ SOLN
1000.0000 ug | Freq: Once | INTRAMUSCULAR | Status: AC
  Administered 2018-09-17: 1000 ug via INTRAMUSCULAR

## 2018-09-23 ENCOUNTER — Ambulatory Visit
Admission: RE | Admit: 2018-09-23 | Discharge: 2018-09-23 | Disposition: A | Discharge status: Home / Self Care | Payer: Medicare Other | Source: Ambulatory Visit | Attending: Neurology | Admitting: Neurology

## 2018-09-23 DIAGNOSIS — M5417 Radiculopathy, lumbosacral region: Secondary | ICD-10-CM

## 2018-09-25 ENCOUNTER — Ambulatory Visit (INDEPENDENT_AMBULATORY_CARE_PROVIDER_SITE_OTHER): Payer: Medicare Other

## 2018-09-25 DIAGNOSIS — E538 Deficiency of other specified B group vitamins: Secondary | ICD-10-CM

## 2018-09-25 MED ORDER — CYANOCOBALAMIN 1000 MCG/ML IJ SOLN
1000.0000 ug | Freq: Once | INTRAMUSCULAR | Status: AC
  Administered 2018-09-25: 1000 ug via INTRAMUSCULAR

## 2018-09-29 ENCOUNTER — Other Ambulatory Visit: Payer: Self-pay | Admitting: *Deleted

## 2018-09-29 ENCOUNTER — Telehealth: Payer: Self-pay | Admitting: *Deleted

## 2018-09-29 DIAGNOSIS — R262 Difficulty in walking, not elsewhere classified: Secondary | ICD-10-CM

## 2018-09-29 DIAGNOSIS — R2681 Unsteadiness on feet: Secondary | ICD-10-CM

## 2018-09-29 NOTE — Telephone Encounter (Signed)
Patient given results and referral sent to [facility] for open MRI.

## 2018-09-29 NOTE — Telephone Encounter (Signed)
-----   Message from Chester Holstein, LPN sent at 8/47/3085  8:40 AM EDT -----   ----- Message ----- From: Pieter Partridge, DO Sent: 09/24/2018   1:43 PM EDT To: Clois Comber, CMA  MRI shows possible pinched nerves but I don't think that would be causing the difficulty walking.  I would like to proceed with MRI of brain without contrast.

## 2018-10-02 ENCOUNTER — Ambulatory Visit (INDEPENDENT_AMBULATORY_CARE_PROVIDER_SITE_OTHER): Payer: Medicare Other

## 2018-10-02 DIAGNOSIS — E538 Deficiency of other specified B group vitamins: Secondary | ICD-10-CM | POA: Diagnosis not present

## 2018-10-02 MED ORDER — CYANOCOBALAMIN 1000 MCG/ML IJ SOLN
1000.0000 ug | Freq: Once | INTRAMUSCULAR | Status: AC
  Administered 2018-10-02: 1000 ug via INTRAMUSCULAR

## 2018-10-07 ENCOUNTER — Ambulatory Visit (INDEPENDENT_AMBULATORY_CARE_PROVIDER_SITE_OTHER): Payer: Medicare Other

## 2018-10-07 DIAGNOSIS — E538 Deficiency of other specified B group vitamins: Secondary | ICD-10-CM

## 2018-10-07 MED ORDER — CYANOCOBALAMIN 1000 MCG/ML IJ SOLN
1000.0000 ug | Freq: Once | INTRAMUSCULAR | Status: AC
  Administered 2018-10-07: 1000 ug via INTRAMUSCULAR

## 2018-10-10 ENCOUNTER — Telehealth: Payer: Self-pay | Admitting: Neurology

## 2018-10-10 NOTE — Telephone Encounter (Signed)
Pt states he had MRI on Tuesday, 10/07/18.  [facility] told him that report would be sent to [place] within 24 hours.  Pt aware that we do not have report.

## 2018-10-10 NOTE — Telephone Encounter (Signed)
pls advise

## 2018-10-10 NOTE — Telephone Encounter (Signed)
The orders were sent to [facility] on 09/29/18 - wasn't sure if you had the report back or not.    Will let pt know that we have not received report and try to obtain it from [facility].

## 2018-10-10 NOTE — Telephone Encounter (Signed)
Fax sent to [facility] requesting MRI report. Confirmation received.

## 2018-10-10 NOTE — Telephone Encounter (Signed)
I have not received it.  We will need to request it.  I don't see any imaging on [facility] either.

## 2018-10-10 NOTE — Telephone Encounter (Signed)
Patient is calling in wanting to get the recent MRI results. Please call him back at [phone]. Thanks!

## 2018-10-10 NOTE — Telephone Encounter (Signed)
I'm not sure which MRI he is referring.  He had an MRI of the lumbar spine last month, which I didn't find anything.  I had then recommended imaging of the brain which I am not aware if it has been performed.

## 2018-10-13 ENCOUNTER — Telehealth: Payer: Self-pay | Admitting: Neurology

## 2018-10-13 NOTE — Telephone Encounter (Signed)
Patient is calling in wanting his recent MRI Results. Please call him back at [phone]. Thanks!

## 2018-10-13 NOTE — Telephone Encounter (Signed)
Called Pt, no answer.

## 2018-10-14 ENCOUNTER — Telehealth: Payer: Self-pay | Admitting: Neurology

## 2018-10-14 NOTE — Telephone Encounter (Signed)
Patient called and left a vm for his recent MRI results. Please call him at [phone]. Thanks!

## 2018-10-14 NOTE — Telephone Encounter (Signed)
MRI of brain shows chronic changes from known cerebrovascular disease but nothing remarkable such as increased fluid on the brain (which may be a cause of balance problems).  The next thing to check would be MRI of cervical spine to evaluate for cervical spinal stenosis.  Unfortunately in neurology we check one thing at a time.

## 2018-10-14 NOTE — Telephone Encounter (Signed)
Called and LMOVM for Pt to call me back

## 2018-10-15 NOTE — Telephone Encounter (Signed)
Called and advised Pt. He states he is feeling better, and wants to hold off on C Spine MRI, but wants to make f/u appt. He is coming in tomorrow with his wife, advised him to make next available

## 2018-10-16 NOTE — Progress Notes (Signed)
NEUROLOGY FOLLOW UP OFFICE NOTE  [name] [name] [id number]  HISTORY OF PRESENT ILLNESS: [name] [name] is a 77 year old left-handed male with hypertension, coronary artery disease, carotid artery disease status post CEA, cerebrovascular disease status post TIA, peripheral venous insufficiency, impaired fasting glucose who follows up for abnormal gait.  UPDATE: TSH from 08/25/18 was 2.24.  B12 was 142.  He was started on B12 injections.  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  MRI of lumbar spine from 09/23/18 was personally reviewed and showed L3-4 central disc protrusion that could possibly irritate either L4 nerve root but not likely cause of gait issues.  MRI of brain without contrast from 10/07/18 showed mild cerebral atrophy and moderate chronic small vessel ischemic changes but no ventriculomegaly consistent with NPH.    He feels much better since starting B12 shots.  He feels like he is walking better.  He feels more steady.  He reports that he will be getting a hearing aid this Christmas and hopes that will help as well.    HISTORY: Since a year ago, he reports problems at balance.  Occasionally, if he is walking and makes a quick turn, he briefly loses his balance.  Sometimes it occurs when he stands from a sitting position.  His legs feel tired.  He walks slower.  When he walks, he has trouble picking up his legs.  His legs are slightly wide based.  He walks leaned forward.  He denies back or radicular pain.  He denies numbness.  His legs feel "tired" but not weak.  When he is out shopping, he uses the cart for support.  Once in awhile he feels briefly dizzy, particularly when he gets up quickly.  He denies urinary incontinence.  His brother has peripheral vascular disease and AAA.  PAST MEDICAL HISTORY: Past Medical History:  Diagnosis Date  . Carotid artery occlusion   . Cerebrovascular disease     . Coronary artery disease    s/p CABG 2005; sees Dr Johnsie Cancel yearly  . Gynecomastia   . Hypercholesterolemia   . Hypertension   . Keloid   . Neurodermatitis   . Overweight(278.02)   . Personal history of colonic polyps 10/08/2006   tubular adenomas  . Renal insufficiency   . Shoulder pain   . Transient ischemic attack 2006   "lasted ~ 5 seconds"  . Vitamin D deficiency     MEDICATIONS: Current Outpatient Medications on File Prior to Visit  Medication Sig Dispense Refill  . aspirin 81 MG tablet Take 81 mg by mouth daily.      [name] atorvastatin (LIPITOR) 20 MG tablet Take 0.5 tablets (10 mg total) by mouth daily. Take as directed 30 tablet 3  . Cholecalciferol (VITAMIN D) 2000 UNITS CAPS Take 1 capsule by mouth daily.     . clopidogrel (PLAVIX) 75 MG tablet Take 1 tablet (75 mg total) by mouth daily. 90 tablet 1  . hydrochlorothiazide (MICROZIDE) 12.5 MG capsule TAKE ONE CAPSULE BY MOUTH EVERY DAY 90 capsule 3  . losartan (COZAAR) 50 MG tablet Take 1 tablet (50 mg total) by mouth daily. 90 tablet 3  . nitroGLYCERIN (NITROSTAT) 0.4 MG SL tablet Place 1 tablet (0.4 mg total) under the tongue every 5 (five) minutes as needed for chest pain. 25 tablet 3   No current facility-administered medications on file prior to visit.     ALLERGIES: Allergies  Allergen Reactions  . Niacin Other (  See Comments)    REACTION: intol to NIACIN w/ headaches    FAMILY HISTORY: Family History  Problem Relation Age of Onset  . Heart disease Mother        Before age 21  . Diabetes Mother   . Kidney disease Mother   . Heart attack Mother 52  . Cancer Father        Lung  . Lung cancer Father 66  . Diabetes Brother   . Heart disease Brother   . Heart disease Brother   . Arthritis Brother   . Diabetes Sister   . Fibromyalgia Sister   . Lung cancer Paternal Uncle        questionable as to if it was lung ca  . Colon cancer Neg Hx     SOCIAL HISTORY: Social History   Socioeconomic History  .  Marital status: Married    Spouse name: [name]  . Number of children: 1  . Years of education: Not on file  . Highest education level: Some college, no degree  Occupational History  . Occupation: retired  Scientific laboratory technician  . Financial resource strain: Not on file  . Food insecurity:    Worry: Not on file    Inability: Not on file  . Transportation needs:    Medical: Not on file    Non-medical: Not on file  Tobacco Use  . Smoking status: Former Research scientist (life sciences)  . Smokeless tobacco: Current User    Types: Snuff  . Tobacco comment: uses dip 3-4 times per week  Substance and Sexual Activity  . Alcohol use: No    Alcohol/week: 0.0 standard drinks  . Drug use: No  . Sexual activity: Yes  Lifestyle  . Physical activity:    Days per week: Not on file    Minutes per session: Not on file  . Stress: Not on file  Relationships  . Social connections:    Talks on phone: Not on file    Gets together: Not on file    Attends religious service: Not on file    Active member of club or organization: Not on file    Attends meetings of clubs or organizations: Not on file    Relationship status: Not on file  . Intimate partner violence:    Fear of current or ex partner: Not on file    Emotionally abused: Not on file    Physically abused: Not on file    Forced sexual activity: Not on file  Other Topics Concern  . Not on file  Social History Narrative   Patient is left-handed. He lives with his wife in a one level home. He drinks one cup of coffee a day. He drinks caffeine free sodas when he he has. He does not exercise.    REVIEW OF SYSTEMS: Constitutional: No fevers, chills, or sweats, no generalized fatigue, change in appetite Eyes: No visual changes, double vision, eye pain Ear, nose and throat: No hearing loss, ear pain, nasal congestion, sore throat Cardiovascular: No chest pain, palpitations Respiratory:  No shortness of breath at rest or with exertion, wheezes GastrointestinaI: No nausea,  vomiting, diarrhea, abdominal pain, fecal incontinence Genitourinary:  No dysuria, urinary retention or frequency Musculoskeletal:  No neck pain, back pain Integumentary: No rash, pruritus, skin lesions Neurological: as above Psychiatric: No depression, insomnia, anxiety Endocrine: No palpitations, fatigue, diaphoresis, mood swings, change in appetite, change in weight, increased thirst Hematologic/Lymphatic:  No purpura, petechiae. Allergic/Immunologic: no itchy/runny eyes, nasal congestion, recent allergic reactions,  rashes  PHYSICAL EXAM: Blood pressure (!) 162/76, pulse 67, height 6\' 2"  (1.88 m), weight 225 lb (102.1 kg), SpO2 98 %. General: No acute distress.  Patient appears well-groomed.   Head:  Normocephalic/atraumatic Eyes:  Fundi examined but not visualized Neck: supple, no paraspinal tenderness, full range of motion Heart:  Regular rate and rhythm Lungs:  Clear to auscultation bilaterally Back: No paraspinal tenderness Neurological Exam: alert and oriented to person, place, and time. Attention span and concentration intact, recent and remote memory intact, fund of knowledge intact.  Speech fluent and not dysarthric, language intact.  CN II-XII intact. Bulk and tone normal, muscle strength 5/5 throughout.  Sensation to light touch, temperature and vibration intact.  Deep tendon reflexes 2+ throughout, toes downgoing.  Finger to nose and heel to shin testing intact.  Gait mildly wide based but with normal stride and arm swing.  Able to turn.  Romberg with sway.   IMPRESSION: Unsteady gait.  Overall, he looks improved.  Possibly secondary to B12 deficiency.  No evidence of NPH, peripheral neuropathy or lumbar stenosis.  He does not exhibit upper motor neuron signs to suggest cervical myelopathy such as due to cervical spinal stenosis.  HTN  PLAN: 1.  He would still like to pursue physical therapy to help with gait.  We will refer him again. 2.  Continue B12 supplements 3.    Repeat B12 in 6 months and follow up afterwards. 4.  Follow up blood pressure with PCP.  25 minutes spent face to face with patient, over 50% spent discussing management.  Metta Clines, DO  CC:  Teressa Lower, MD

## 2018-10-17 ENCOUNTER — Ambulatory Visit (INDEPENDENT_AMBULATORY_CARE_PROVIDER_SITE_OTHER): Payer: Medicare Other | Admitting: Neurology

## 2018-10-17 ENCOUNTER — Other Ambulatory Visit: Payer: Self-pay

## 2018-10-17 ENCOUNTER — Encounter: Payer: Self-pay | Admitting: Neurology

## 2018-10-17 VITALS — BP 162/76 | HR 67 | Ht 74.0 in | Wt 225.0 lb

## 2018-10-17 DIAGNOSIS — E538 Deficiency of other specified B group vitamins: Secondary | ICD-10-CM

## 2018-10-17 DIAGNOSIS — R2681 Unsteadiness on feet: Secondary | ICD-10-CM | POA: Diagnosis not present

## 2018-10-17 NOTE — Patient Instructions (Addendum)
1.  We will get you physical therapy 2.  Continue B12 shots. 3.  Repeat B12 lab in 6 months.  Follow up afterwards.

## 2018-10-23 ENCOUNTER — Telehealth: Payer: Self-pay | Admitting: Neurology

## 2018-10-23 NOTE — Telephone Encounter (Signed)
Called and provided Pt with Neuro rehabs contact #

## 2018-10-23 NOTE — Telephone Encounter (Signed)
Patient is calling in about an order that should have been put in for physical therapy. He hasn't heard anything since his last appt when it was discussed. Please call him back at [phone]. Thanks!

## 2018-10-31 ENCOUNTER — Other Ambulatory Visit: Payer: Self-pay

## 2018-10-31 ENCOUNTER — Ambulatory Visit: Payer: Medicare Other | Attending: Neurology | Admitting: Physical Therapy

## 2018-10-31 ENCOUNTER — Encounter: Payer: Self-pay | Admitting: Physical Therapy

## 2018-10-31 DIAGNOSIS — R2681 Unsteadiness on feet: Secondary | ICD-10-CM | POA: Insufficient documentation

## 2018-10-31 NOTE — Therapy (Signed)
[facility] [address] [place], [place], [address] Phone: [phone]   Fax:  [phone]  Physical Therapy Evaluation and Discharge Summary  Patient Details  Name: [name] [name] MRN: [id number] Date of Birth: Sep 26, 1941 Referring Provider (PT):  Pieter Partridge, DO   Encounter Date: 10/31/2018  PT End of Session - 10/31/18 0844    Visit Number  1    Number of Visits  1    PT Start Time  0745    PT Stop Time  0832    PT Time Calculation (min)  47 min    Activity Tolerance  Patient tolerated treatment well    Behavior During Therapy  [facility] for tasks assessed/performed       Past Medical History:  Diagnosis Date  . Carotid artery occlusion   . Cerebrovascular disease   . Coronary artery disease    s/p CABG 2005; sees Dr Johnsie Cancel yearly  . Gynecomastia   . Hypercholesterolemia   . Hypertension   . Keloid   . Neurodermatitis   . Overweight(278.02)   . Personal history of colonic polyps 10/08/2006   tubular adenomas  . Renal insufficiency   . Shoulder pain   . Transient ischemic attack 2006   "lasted ~ 5 seconds"  . Vitamin D deficiency     Past Surgical History:  Procedure Laterality Date  . CARDIAC CATHETERIZATION  02/2004   "tried to stent; couldn't"  . CAROTID ENDARTERECTOMY Right 08/26/2015  . CORONARY ANGIOPLASTY    . CORONARY ARTERY BYPASS GRAFT  Feb. 2005   4 vessel  . ENDARTERECTOMY Right 08/26/2015   Procedure: Right Carotid ENDARTERECTOMY with Patch Angioplasty ;  Surgeon: Rosetta Posner, MD;  Location: [facility];  Service: Vascular;  Laterality: Right;  . 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 Dessie Coma  . KELOID EXCISION    . PILONIDAL CYST EXCISION  1989    There were no vitals filed for this visit.   Subjective Assessment - 10/31/18 0755    Subjective  My gait had changed, I can't classify as unsteady. Some minor balance issues. But I think I've fixed a lot of it.. I started B12  shots, started taking longer steps, which helped me incr my speed. Does not regularly exercise.     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     Currently in Pain?  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, 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      Cognition   Overall Cognitive Status  Within Functional Limits for tasks assessed      Sensation   Light Touch  Appears Intact      Coordination   Gross Motor Movements are Fluid and Coordinated  Yes    Fine Motor Movements are Fluid and Coordinated  No   sl difficulty with Rapid toe taps     Posture/Postural Control   Posture/Postural Control  Postural limitations    Postural Limitations  Rounded Shoulders;Forward head      ROM / Strength   AROM / PROM / Strength  AROM;Strength      AROM   Overall AROM   Within functional limits for tasks performed   LEs     Strength   Overall Strength  Within functional limits for tasks performed   bil LEs    Overall Strength Comments  5/5 bil hip abdct, knee extension, ankle  DF      Flexibility   Soft Tissue Assessment /Muscle Length  yes    Hamstrings  seated can only extend knees to -20 degrees due to hamstring tightness      Ambulation/Gait   Ambulation/Gait  Yes    Ambulation/Gait Assistance  7: Independent    Ambulation Distance (Feet)  300 Feet    Assistive device  None    Gait Pattern  Decreased arm swing - right;Decreased arm swing - left;Decreased stride length;Trunk flexed;Wide base of support   rt foot turned out more than left   Ambulation Surface  Level;Indoor    Gait velocity  32.8/11.88=2.76    Stairs  Yes    Stairs Assistance  7: Independent    Stair Management Technique  No rails;Forwards    Number of Stairs  4    Height of Stairs  6      Standardized Balance Assessment   Standardized Balance Assessment  Timed Up and Go Test      Timed Up and Go Test   Normal TUG (seconds)  10.91    Cognitive TUG (seconds)  11.53   6% differencre from TUG normal     Functional Gait  Assessment   Gait assessed   Yes    Gait Level Surface  Walks 20 ft in less than 7 sec but greater than 5.5 sec, uses assistive device, slower speed, mild gait deviations, or deviates 6-10 in outside of the 12 in walkway width.   6.38 sec   Change in Gait Speed  Able to change speed, demonstrates mild gait deviations, deviates 6-10 in outside of the 12 in walkway width, or no gait deviations, unable to achieve a major change in velocity, or uses a change in velocity, or uses an assistive device.    Gait with Horizontal Head Turns  Performs head turns smoothly with no change in gait. Deviates no more than 6 in outside 12 in walkway width    Gait with Vertical Head Turns  Performs head turns with no change in gait. Deviates no more than 6 in outside 12 in walkway width.    Gait and Pivot Turn  Pivot turns safely within 3 sec and stops quickly with no loss of balance.    Step Over Obstacle  Is able to step over 2 stacked shoe boxes taped together (9 in  total height) without  changing gait speed. No evidence of imbalance.    Gait with Narrow Base of Support  Ambulates 4-7 steps.   5 steps   Gait with Eyes Closed  Walks 20 ft, uses assistive device, slower speed, mild gait deviations, deviates 6-10 in outside 12 in walkway width. Ambulates 20 ft in less than 9 sec but greater than 7 sec.   8.44   Ambulating Backwards  Walks 20 ft, uses assistive device, slower speed, mild gait deviations, deviates 6-10 in outside 12 in walkway width.    Steps  Alternating feet, no rail.    Total Score  24    FGA comment:  <23 indicates incr fall risk                Objective measurements completed on examination: See above findings.              PT Education - 10/31/18 [id number]    Education Details  results of evaluation (all WNL except velocity slightly below norm for age but above cut-off for incr fall risk); recommend walking program and simple balance exercises    Person(s) Educated  Patient    Methods  Explanation;Demonstration    Comprehension  Verbalized understanding;Returned demonstration                  Plan - 10/31/18 2002    Clinical Impression Statement  Patient referred to OPPT for unsteady gait which developed over several months. He denies falls or near falls and reports he has been doing better since beginning B12 shots. Overall, his functional measures were WNL (TUG, TUG cognitive, gait velocity, FGA). He reports he does not regularly exercise and was educated in benefits. Encouraged he initiate a walking program (5 days/week for minimum of 20 minutes) and simple balance exercises (SLS, hip abduction). Patient agrees with plan and that no further PT is indicated at this time.     History and Personal Factors relevant to plan of care:  -HTN, CAD, TIA, B12 deficiency    Clinical Presentation  Stable    Clinical Presentation due to:  improving since MD made referral    Clinical Decision Making  Low    PT Frequency  One time visit     Consulted and Agree with Plan of Care  Patient       Patient will benefit from skilled therapeutic intervention in order to improve the following deficits and impairments:     Visit Diagnosis: Unsteadiness on feet - Plan: PT plan of care cert/re-cert     Problem List Patient Active Problem List   Diagnosis Date Noted  . 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 Status post carotid endarterectomy 01/30/2016  . Hyperlipidemia 01/30/2016  . Carotid stenosis 08/26/2015  . 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 Aftercare following surgery of the circulatory system, [place] 07/23/2014  . Carotid artery disease ([place]) 07/01/2012  . Personal history of colonic polyps 01/08/2012  . Special screening for malignant neoplasm of prostate 05/08/2011  . Vitamin D deficiency 11/20/2009  . Transient cerebral ischemia 06/30/2009  . CEREBROVASCULAR DISEASE 06/30/2009  . Overweight 02/04/2009  . COLONIC POLYPS 01/10/2008  . Essential hypertension 01/10/2008  . Coronary atherosclerosis 01/10/2008  . 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 GYNECOMASTIA, UNILATERAL 01/10/2008  . 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 SHOULDER PAIN 01/10/2008  . Impaired fasting glucose 01/10/2008    Rexanne Mano, PT 10/31/2018, 8:11 PM  [facility] [address]  [place], [place], [address] Phone: [phone]   Fax:  [phone]  Name: [name] [name] MRN: [id number] Date of Birth: May 20, 1941

## 2018-11-14 ENCOUNTER — Other Ambulatory Visit: Payer: Self-pay | Admitting: *Deleted

## 2018-11-14 ENCOUNTER — Ambulatory Visit (INDEPENDENT_AMBULATORY_CARE_PROVIDER_SITE_OTHER): Payer: Medicare Other

## 2018-11-14 DIAGNOSIS — E538 Deficiency of other specified B group vitamins: Secondary | ICD-10-CM

## 2018-11-14 MED ORDER — LOSARTAN POTASSIUM 25 MG PO TABS: 50.0000 mg | ORAL_TABLET | Freq: Every day | ORAL | 2 refills | Status: DC

## 2018-11-14 MED ORDER — CYANOCOBALAMIN 1000 MCG/ML IJ SOLN
1000.0000 ug | Freq: Once | INTRAMUSCULAR | Status: AC
  Administered 2018-11-14: 1000 ug via INTRAMUSCULAR

## 2018-11-14 NOTE — Telephone Encounter (Signed)
Received fax from [facility] indicating that the losartan 50 mg tabs are on backorder with no release date. They are requesting an rx for the 25 mg with a sig of take two tabs qd or the 100 mg with a sig of one half tablet qd. I spoke with the patient in regards to this and he stated that he already has another medication that he has to split so he would prefer an rx for the 25 mg. I reiterated that he would need to take two of these tablets qd to equal the prescribed dose of 50 mg qd.

## 2018-12-04 ENCOUNTER — Other Ambulatory Visit: Payer: Self-pay | Admitting: Pulmonary Disease

## 2018-12-16 ENCOUNTER — Ambulatory Visit (INDEPENDENT_AMBULATORY_CARE_PROVIDER_SITE_OTHER): Payer: Medicare Other

## 2018-12-16 DIAGNOSIS — E538 Deficiency of other specified B group vitamins: Secondary | ICD-10-CM

## 2018-12-16 MED ORDER — CYANOCOBALAMIN 1000 MCG/ML IJ SOLN
1000.0000 ug | Freq: Once | INTRAMUSCULAR | Status: AC
  Administered 2018-12-16: 1000 ug via INTRAMUSCULAR

## 2019-01-14 ENCOUNTER — Ambulatory Visit (INDEPENDENT_AMBULATORY_CARE_PROVIDER_SITE_OTHER): Payer: Medicare Other

## 2019-01-14 DIAGNOSIS — E538 Deficiency of other specified B group vitamins: Secondary | ICD-10-CM

## 2019-01-14 MED ORDER — CYANOCOBALAMIN 1000 MCG/ML IJ SOLN
1000.0000 ug | Freq: Once | INTRAMUSCULAR | Status: AC
  Administered 2019-01-14: 1000 ug via INTRAMUSCULAR

## 2019-02-17 ENCOUNTER — Ambulatory Visit (INDEPENDENT_AMBULATORY_CARE_PROVIDER_SITE_OTHER): Payer: Medicare Other

## 2019-02-17 DIAGNOSIS — E538 Deficiency of other specified B group vitamins: Secondary | ICD-10-CM | POA: Diagnosis not present

## 2019-02-17 MED ORDER — CYANOCOBALAMIN 1000 MCG/ML IJ SOLN
1000.0000 ug | Freq: Once | INTRAMUSCULAR | Status: AC
  Administered 2019-02-17: 1000 ug via INTRAMUSCULAR

## 2019-03-20 ENCOUNTER — Ambulatory Visit (INDEPENDENT_AMBULATORY_CARE_PROVIDER_SITE_OTHER): Payer: Medicare Other | Admitting: Primary Care

## 2019-03-20 ENCOUNTER — Other Ambulatory Visit: Payer: Self-pay

## 2019-03-20 ENCOUNTER — Encounter: Payer: Self-pay | Admitting: Primary Care

## 2019-03-20 VITALS — BP 128/70 | HR 65 | Temp 98.0°F | Ht 73.0 in | Wt 230.0 lb

## 2019-03-20 DIAGNOSIS — E782 Mixed hyperlipidemia: Secondary | ICD-10-CM

## 2019-03-20 DIAGNOSIS — I6521 Occlusion and stenosis of right carotid artery: Secondary | ICD-10-CM | POA: Diagnosis not present

## 2019-03-20 DIAGNOSIS — I10 Essential (primary) hypertension: Secondary | ICD-10-CM | POA: Diagnosis not present

## 2019-03-20 DIAGNOSIS — J069 Acute upper respiratory infection, unspecified: Secondary | ICD-10-CM

## 2019-03-20 DIAGNOSIS — I2510 Atherosclerotic heart disease of native coronary artery without angina pectoris: Secondary | ICD-10-CM | POA: Diagnosis not present

## 2019-03-20 MED ORDER — HYDROCOD POLST-CPM POLST ER 10-8 MG/5ML PO SUER: 5.0000 mL | Freq: Two times a day (BID) | ORAL | 0 refills | Status: DC | PRN

## 2019-03-20 NOTE — Progress Notes (Signed)
Subjective:    Patient ID: [name] [name], male    DOB: December 11, 1941, 78 y.o.   MRN: [id number]  HPI  Mr. Harshberger is a 78 year old male who presents today to establish care and discuss the problems mentioned below. Will obtain/review records.  1) Essential Hypertension: Currently managed on HCTZ 12.5 mg, losartan 25 mg. He denies chest pain, dizziness.   BP Readings from Last 3 Encounters:  03/20/19 128/70  10/17/18 (!) 162/76  09/02/18 134/70   2) CVD/CAD/Hperlipidemia: History of CABG 2005. Currently managed on atorvastatin 20 mg and clopidogrel 75 mg. He denies chest pain. He is following with Dr. Johnsie Cancel through cardiology annually. Right carotid endarterectomy in 2016.  3) Abnormal Gait: Following with neurology with last visit being in October 2019. It was determined that his symptoms may have been from vitamin B12 deficiency. There was no evidence of neuropathy, lumbar stenosis, cervical myelopathy. He underwent MRI of the brain which was without obvious cause. He was referred to PT for gait stability for which he went for one visit and was told that he needed to work on walking. He is due for a follow up visit with his neurologist in April 2020.  4) B12 Deficiency: Diagnosed in August 2019 with level of 142. He is on monthly B12 injections and is due for repeat lab testing in April per neurology.  5) Cough: Also with sore throat, post nasal drip, nasal congestion. Began about one week ago. He's been around his great granddaughter who has had similar symptoms. He denies chest congestion, fevers, travel, shortness of breath. He's not taken anything OTC except for Vitamin C. Overall he's feeling better today. His main concern is cough which is worse and night and therefore keeping him from resting.   Review of Systems  Constitutional: Negative for fever.  HENT: Positive for congestion, postnasal drip and sore throat. Negative for sinus pressure.   Eyes: Negative for visual  disturbance.  Respiratory: Positive for cough. Negative for shortness of breath.   Cardiovascular: Negative for chest pain.  Gastrointestinal: Negative for nausea.  Musculoskeletal: Negative for myalgias.  Allergic/Immunologic: Negative for environmental allergies.  Neurological: Negative for dizziness.  Hematological: Negative for adenopathy.       Past Medical History:  Diagnosis Date  . Carotid artery occlusion   . Cerebrovascular disease   . Coronary artery disease    s/p CABG 2005; sees Dr Johnsie Cancel yearly  . Gynecomastia   . Hypercholesterolemia   . Hypertension   . Keloid   . Neurodermatitis   . Overweight(278.02)   . Personal history of colonic polyps 10/08/2006   tubular adenomas  . Renal insufficiency   . Shoulder pain   . Transient ischemic attack 2006   "lasted ~ 5 seconds"  . Vitamin D deficiency      Social History   Socioeconomic History  . Marital status: Married    Spouse name: [name]  . Number of children: 1  . Years of education: Not on file  . Highest education level: Some college, no degree  Occupational History  . Occupation: retired  Scientific laboratory technician  . Financial resource strain: Not on file  . Food insecurity:    Worry: Not on file    Inability: Not on file  . Transportation needs:    Medical: Not on file    Non-medical: Not on file  Tobacco Use  . Smoking status: Former Research scientist (life sciences)  . Smokeless tobacco: Former Systems developer    Types: Snuff  .  Tobacco comment: uses dip 3-4 times per week  Substance and Sexual Activity  . Alcohol use: No    Alcohol/week: 0.0 standard drinks  . Drug use: No  . Sexual activity: Yes  Lifestyle  . Physical activity:    Days per week: Not on file    Minutes per session: Not on file  . Stress: Not on file  Relationships  . Social connections:    Talks on phone: Not on file    Gets together: Not on file    Attends religious service: Not on file    Active member of club or organization: Not on file    Attends meetings of  clubs or organizations: Not on file    Relationship status: Not on file  . Intimate partner violence:    Fear of current or ex partner: Not on file    Emotionally abused: Not on file    Physically abused: Not on file    Forced sexual activity: Not on file  Other Topics Concern  . Not on file  Social History Narrative   Retired.   Once worked for the [employer].   Married.   Enjoys reading, spending time with family.     Past Surgical History:  Procedure Laterality Date  . CARDIAC CATHETERIZATION  02/2004   "tried to stent; couldn't"  . CAROTID ENDARTERECTOMY Right 08/26/2015  . CORONARY ANGIOPLASTY    . CORONARY ARTERY BYPASS GRAFT  Feb. 2005   4 vessel  . ENDARTERECTOMY Right 08/26/2015   Procedure: Right Carotid ENDARTERECTOMY with Patch Angioplasty ;  Surgeon: Rosetta Posner, MD;  Location: [facility];  Service: Vascular;  Laterality: Right;  . 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 Dessie Coma  . KELOID EXCISION    . PILONIDAL CYST EXCISION  1989    Family History  Problem Relation Age of Onset  . Heart disease Mother        Before age 53  . Diabetes Mother   . Kidney disease Mother   . Heart attack Mother 69  . Cancer Father        Lung  . Lung cancer Father 67  . Diabetes Brother   . Heart disease Brother   . Heart disease Brother   . Arthritis Brother   . Diabetes Sister   . Fibromyalgia Sister   . Lung cancer Paternal Uncle        questionable as to if it was lung ca  . Colon cancer Neg Hx     Allergies  Allergen Reactions  . Niacin Other (See Comments)    REACTION: intol to NIACIN w/ headaches    Current Outpatient Medications on File Prior to Visit  Medication Sig Dispense Refill  . aspirin 81 MG tablet Take 81 mg by mouth daily.      [name] atorvastatin (LIPITOR) 20 MG tablet Take 0.5 tablets (10 mg total) by mouth daily. Take as directed 30 tablet 3  . Cholecalciferol (VITAMIN D) 2000 UNITS CAPS Take 1 capsule by mouth daily.     . clopidogrel (PLAVIX) 75 MG  tablet TAKE 1 TABLET BY MOUTH EVERY DAY 90 tablet 3  . hydrochlorothiazide (MICROZIDE) 12.5 MG capsule TAKE ONE CAPSULE BY MOUTH EVERY DAY 90 capsule 3  . losartan (COZAAR) 25 MG tablet Take 2 tablets (50 mg total) by mouth daily. 180 tablet 2  . nitroGLYCERIN (NITROSTAT) 0.4 MG SL tablet Place 1 tablet (0.4 mg total) under the tongue every 5 (five) minutes  as needed for chest pain. 25 tablet 3  . trimethoprim-polymyxin b (POLYTRIM) ophthalmic solution      No current facility-administered medications on file prior to visit.     BP 128/70 (BP Location: Left Arm, Patient Position: Sitting, Cuff Size: Large)   Pulse 65   Temp 98 F (36.7 C) (Oral)   Ht 6\' 1"  (1.854 m)   Wt 230 lb (104.3 kg)   SpO2 93%   BMI 30.34 kg/m    Objective:   Physical Exam  Constitutional: He is oriented to person, place, and time. He appears well-nourished. He does not appear ill.  HENT:  Right Ear: Tympanic membrane and ear canal normal.  Left Ear: Tympanic membrane and ear canal normal.  Nose: Mucosal edema present. Right sinus exhibits no maxillary sinus tenderness and no frontal sinus tenderness. Left sinus exhibits no maxillary sinus tenderness and no frontal sinus tenderness.  Mouth/Throat: Oropharynx is clear and moist.  Neck: Neck supple.  Cardiovascular: Normal rate and regular rhythm.  Respiratory: Effort normal and breath sounds normal. He has no wheezes.  Dry cough during exam  Neurological: He is alert and oriented to person, place, and time.  Skin: Skin is warm and dry.  Psychiatric: He has a normal mood and affect.           Assessment & Plan:  URI:  Cough, congestion, sore throat from cough x 1 week. Feeling better. Exam today with clear lungs, dry cough a few times during exam, doesn't appear sickly or in distress. Do suspect viral etiology and will treat with conservative measures. Rx for Tussionex sent to pharmacy for HS cough. Also provided with other OTC treatment. Strict  return precautions provided.  Pleas Koch, NP

## 2019-03-20 NOTE — Patient Instructions (Signed)
Most of the time these symptoms are caused by a viral infection which will resolve on its own over time. You should be feeling better by day seven of symptoms.  You can try a few things over the counter to help with your symptoms including:  Cough: Delsym or Robitussin (get the off brand, works just as well) Chest Congestion: Mucinex (plain) Nasal Congestion/Ear Pressure/Sinus Pressure: Try using Flonase (fluticasone) nasal spray. Instill 1 spray in each nostril twice daily. This can be purchased over the counter. Body aches, fevers, headache: Ibuprofen (not to exceed 2400 mg in 24 hours) or Acetaminophen-Tylenol (not to exceed 3000 mg in 24 hours) Runny Nose/Throat Drainage/Sneezing/Itchy or Watery Eyes: An antihistamine such as Zyrtec, Claritin, Xyzal, Allegra  You may take the cough suppressant every 12 hours as needed for cough and rest. Caution this medication contains codeine which may cause drowsiness.   It was a pleasure to meet you today! Please don't hesitate to call or message me with any questions. Welcome to [facility]!

## 2019-03-22 ENCOUNTER — Encounter: Payer: Self-pay | Admitting: Primary Care

## 2019-03-22 NOTE — Assessment & Plan Note (Signed)
Stable in the office today, continue current regimen. 

## 2019-03-22 NOTE — Assessment & Plan Note (Signed)
Continue statin therapy, clopidogrel, BP control.  Following with cardiology.

## 2019-03-22 NOTE — Assessment & Plan Note (Addendum)
Continue statin therapy, BP control, and clopidogrel. Carotid endarterectomy in 2016. Following with cardiology. Asymptomatic.

## 2019-03-22 NOTE — Assessment & Plan Note (Signed)
LDL from August 2019 at good control. Continue statin therapy.

## 2019-04-08 ENCOUNTER — Ambulatory Visit: Payer: Medicare Other | Admitting: Nurse Practitioner

## 2019-04-15 ENCOUNTER — Other Ambulatory Visit: Payer: Self-pay | Admitting: *Deleted

## 2019-04-15 ENCOUNTER — Other Ambulatory Visit (INDEPENDENT_AMBULATORY_CARE_PROVIDER_SITE_OTHER): Payer: Medicare Other

## 2019-04-15 ENCOUNTER — Other Ambulatory Visit: Payer: Self-pay

## 2019-04-15 ENCOUNTER — Telehealth: Payer: Self-pay | Admitting: Neurology

## 2019-04-15 DIAGNOSIS — E538 Deficiency of other specified B group vitamins: Secondary | ICD-10-CM

## 2019-04-15 DIAGNOSIS — R2681 Unsteadiness on feet: Secondary | ICD-10-CM

## 2019-04-15 DIAGNOSIS — R262 Difficulty in walking, not elsewhere classified: Secondary | ICD-10-CM

## 2019-04-15 LAB — VITAMIN B12: Vitamin B-12: 419 pg/mL (ref 211–911)

## 2019-04-15 NOTE — Telephone Encounter (Signed)
Orders placed in system

## 2019-04-15 NOTE — Telephone Encounter (Signed)
Patient left vm about needing an order for his B12 blood work today. Please put this order in. Thanks!

## 2019-04-17 NOTE — Progress Notes (Signed)
Virtual Visit via Video Note The purpose of this virtual visit is to provide medical care while limiting exposure to the novel coronavirus.    Consent was obtained for video visit:  Yes.   Answered questions that patient had about telehealth interaction:  Yes.   I discussed the limitations, risks, security and privacy concerns of performing an evaluation and management service by telemedicine. I also discussed with the patient that there may be a patient responsible charge related to this service. The patient expressed understanding and agreed to proceed.  Pt location: Home Physician Location: office Name of referring provider:  Noralee Space, MD I connected with [name] [name] at patients initiation/request on 04/20/2019 at  8:50 AM EDT by video enabled telemedicine application and verified that I am speaking with the correct person using two identifiers. Pt MRN:  [id number] Pt DOB:  July 10, 1941 Video Participants:  [name] [name]   History of Present Illness:  [name] [name] is a 78 year old left-handed man with hypertension, coronary artery disease, carotid artery disease status post CEA, cerebrovascular disease status post TIA, peripheral venous insufficiency, impaired fasting glucose who follows up for unsteady gait and B12 deficiency.  UPDATE: He has continued B12 injections however he has not been able to get injections for the last 2 months due to the COVID-19 pandemic.  B12 level from 04/15/19 was 419.  Overall, his gait has improved.  Sometimes he needs to remind himself to take longer strides.  HISTORY: Since 2018, he reports problems at balance. Occasionally, if he is walking and makes a quick turn, he briefly loses his balance. Sometimes it occurs when he stands from a sitting position. His legs feel tired. He walks slower. When he walks, he has trouble picking up his legs. His legs are slightly wide based. He walks leaned forward. He denies back or radicular pain.  He denies numbness. His legs feel "tired" but not weak. When he is out shopping, he uses the cart for support. Once in awhile he feels briefly dizzy, particularly when he gets up quickly. He denies urinary incontinence.  TSH from 08/25/18 was 2.24.  B12 was 142.  He was started on B12 injections.  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  MRI of lumbar spine from 09/23/18 was personally reviewed and showed L3-4 central disc protrusion that could possibly irritate either L4 nerve root but not likely cause of gait issues.  MRI of brain without contrast from 10/07/18 showed mild cerebral atrophy and moderate chronic small vessel ischemic changes but no ventriculomegaly consistent with NPH.    His brother has peripheral vascular disease and AAA.  Past Medical History: Past Medical History:  Diagnosis Date   Carotid artery occlusion    Cerebrovascular disease    Coronary artery disease    s/p CABG 2005; sees Dr Johnsie Cancel yearly   Gynecomastia    Hx of CABG 08/01/2016   Hypercholesterolemia    Hypertension    Keloid    Neurodermatitis    Overweight(278.02)    Personal history of colonic polyps 10/08/2006   tubular adenomas   Renal insufficiency    Shoulder pain    Transient ischemic attack 2006   "lasted ~ 5 seconds"   Vitamin D deficiency     Medications: Outpatient Encounter Medications as of 04/20/2019  Medication Sig   aspirin 81 MG tablet Take 81 mg by mouth daily.     atorvastatin (LIPITOR) 20 MG  tablet Take 0.5 tablets (10 mg total) by mouth daily. Take as directed   chlorpheniramine-HYDROcodone (TUSSIONEX PENNKINETIC ER) 10-8 MG/5ML SUER Take 5 mLs by mouth every 12 (twelve) hours as needed for cough.   Cholecalciferol (VITAMIN D) 2000 UNITS CAPS Take 1 capsule by mouth daily.    clopidogrel (PLAVIX) 75 MG tablet TAKE 1 TABLET BY MOUTH EVERY DAY   hydrochlorothiazide (MICROZIDE) 12.5  MG capsule TAKE ONE CAPSULE BY MOUTH EVERY DAY   losartan (COZAAR) 25 MG tablet Take 2 tablets (50 mg total) by mouth daily.   nitroGLYCERIN (NITROSTAT) 0.4 MG SL tablet Place 1 tablet (0.4 mg total) under the tongue every 5 (five) minutes as needed for chest pain.   trimethoprim-polymyxin b (POLYTRIM) ophthalmic solution    No facility-administered encounter medications on file as of 04/20/2019.     Allergies: Allergies  Allergen Reactions   Niacin Other (See Comments)    REACTION: intol to NIACIN w/ headaches    Family History: Family History  Problem Relation Age of Onset   Heart disease Mother        Before age 64   Diabetes Mother    Kidney disease Mother    Heart attack Mother 32   Cancer Father        Lung   Lung cancer Father 71   Diabetes Brother    Heart disease Brother    Heart disease Brother    Arthritis Brother    Diabetes Sister    Fibromyalgia Sister    Lung cancer Paternal Uncle        questionable as to if it was lung ca   Colon cancer Neg Hx     Social History: Social History   Socioeconomic History   Marital status: Married    Spouse name: [name]   Number of children: 1   Years of education: Not on file   Highest education level: Some college, no degree  Occupational History   Occupation: retired  Scientist, product/process development strain: Not on Training and development officer insecurity:    Worry: Not on file    Inability: Not on Lexicographer needs:    Medical: Not on file    Non-medical: Not on file  Tobacco Use   Smoking status: Former Smoker   Smokeless tobacco: Former Systems developer    Types: Snuff   Tobacco comment: uses dip 3-4 times per week  Substance and Sexual Activity   Alcohol use: No    Alcohol/week: 0.0 standard drinks   Drug use: No   Sexual activity: Yes  Lifestyle   Physical activity:    Days per week: Not on file    Minutes per session: Not on file   Stress: Not on file  Relationships   Social  connections:    Talks on phone: Not on file    Gets together: Not on file    Attends religious service: Not on file    Active member of club or organization: Not on file    Attends meetings of clubs or organizations: Not on file    Relationship status: Not on file   Intimate partner violence:    Fear of current or ex partner: Not on file    Emotionally abused: Not on file    Physically abused: Not on file    Forced sexual activity: Not on file  Other Topics Concern   Not on file  Social History Narrative   Retired.   Once worked  for the [employer].   Married.   Enjoys reading, spending time with family.    Observations/Objective:   Blood pressure 134/68, pulse 72, height 6\' 2"  (1.88 m), weight 220 lb (99.8 kg). Alert and oriented.  Speech fluent and not dysarthric.  Language intact.  Eyes orthophoric and move in all directions.  Face symmetric.    Assessment and Plan:   Unsteady gait.  Possibly secondary to B12 deficiency.  No evidence of NPH, peripheral neuropathy or lumbar stenosis.  He does not exhibit upper motor neuron signs to suggest cervical myelopathy such as due to cervical spinal stenosis.  Advised to start over the counter B12 1066mcg pill daily. Follow up in 6 months.  Follow Up Instructions:    -I discussed the assessment and treatment plan with the patient. The patient was provided an opportunity to ask questions and all were answered. The patient agreed with the plan and demonstrated an understanding of the instructions.   The patient was advised to call back or seek an in-person evaluation if the symptoms worsen or if the condition fails to improve as anticipated.    Total Time spent in visit with the patient was:  10 minutes.   Dudley Major, DO

## 2019-04-20 ENCOUNTER — Other Ambulatory Visit: Payer: Self-pay

## 2019-04-20 ENCOUNTER — Encounter: Payer: Self-pay | Admitting: Neurology

## 2019-04-20 ENCOUNTER — Telehealth (INDEPENDENT_AMBULATORY_CARE_PROVIDER_SITE_OTHER): Payer: Medicare Other | Admitting: Neurology

## 2019-04-20 VITALS — BP 134/68 | HR 72 | Ht 74.0 in | Wt 220.0 lb

## 2019-04-20 DIAGNOSIS — E538 Deficiency of other specified B group vitamins: Secondary | ICD-10-CM

## 2019-04-20 DIAGNOSIS — R2681 Unsteadiness on feet: Secondary | ICD-10-CM | POA: Diagnosis not present

## 2019-04-20 NOTE — Patient Instructions (Signed)
Start over the counter B12 1082mcg pill daily. Follow up in 6 months

## 2019-04-24 ENCOUNTER — Other Ambulatory Visit: Payer: Self-pay | Admitting: Pulmonary Disease

## 2019-04-28 ENCOUNTER — Other Ambulatory Visit: Payer: Self-pay | Admitting: Pulmonary Disease

## 2019-05-01 ENCOUNTER — Other Ambulatory Visit: Payer: Self-pay | Admitting: Pulmonary Disease

## 2019-05-27 ENCOUNTER — Encounter: Payer: Self-pay | Admitting: Primary Care

## 2019-05-27 ENCOUNTER — Telehealth: Payer: Self-pay | Admitting: Primary Care

## 2019-05-27 DIAGNOSIS — I10 Essential (primary) hypertension: Secondary | ICD-10-CM

## 2019-05-27 DIAGNOSIS — Z125 Encounter for screening for malignant neoplasm of prostate: Secondary | ICD-10-CM

## 2019-05-27 DIAGNOSIS — R7303 Prediabetes: Secondary | ICD-10-CM

## 2019-05-27 DIAGNOSIS — E782 Mixed hyperlipidemia: Secondary | ICD-10-CM

## 2019-05-27 NOTE — Telephone Encounter (Signed)
Spoken and notified patient's wife of Kate Clark's comments. Patient's wife verbalized understanding.  

## 2019-05-27 NOTE — Telephone Encounter (Signed)
Best number [phone]  Pt schedule cpx for 6/9 and wants to have his labs done at [facility].  Please put order in  Pt would like to have CRP done also psa  Cholesterol  Along with other labs you order   Please advise pt when order has been placed

## 2019-05-27 NOTE — Telephone Encounter (Signed)
Noted, please notify patient that lab orders have been placed. He will need to fast four hours prior.

## 2019-06-03 ENCOUNTER — Other Ambulatory Visit (INDEPENDENT_AMBULATORY_CARE_PROVIDER_SITE_OTHER): Payer: Medicare Other

## 2019-06-03 DIAGNOSIS — E782 Mixed hyperlipidemia: Secondary | ICD-10-CM

## 2019-06-03 DIAGNOSIS — Z125 Encounter for screening for malignant neoplasm of prostate: Secondary | ICD-10-CM

## 2019-06-03 DIAGNOSIS — I10 Essential (primary) hypertension: Secondary | ICD-10-CM

## 2019-06-03 DIAGNOSIS — R7303 Prediabetes: Secondary | ICD-10-CM | POA: Diagnosis not present

## 2019-06-03 LAB — COMPREHENSIVE METABOLIC PANEL
ALT: 18 U/L (ref 0–53)
AST: 17 U/L (ref 0–37)
Albumin: 3.9 g/dL (ref 3.5–5.2)
Alkaline Phosphatase: 67 U/L (ref 39–117)
BUN: 20 mg/dL (ref 6–23)
CO2: 29 mEq/L (ref 19–32)
Calcium: 8.8 mg/dL (ref 8.4–10.5)
Chloride: 103 mEq/L (ref 96–112)
Creatinine, Ser: 1.1 mg/dL (ref 0.40–1.50)
GFR: 65.16 mL/min (ref >60.00)
Glucose, Bld: 128 mg/dL — ABNORMAL HIGH (ref 70–99)
Potassium: 3.9 mEq/L (ref 3.5–5.1)
Sodium: 140 mEq/L (ref 135–145)
Total Bilirubin: 0.8 mg/dL (ref 0.2–1.2)
Total Protein: 6.3 g/dL (ref 6.0–8.3)

## 2019-06-03 LAB — LIPID PANEL
Cholesterol: 111 mg/dL (ref 0–200)
HDL: 31.6 mg/dL — ABNORMAL LOW (ref >39.00)
LDL Cholesterol: 55 mg/dL (ref 0–99)
NonHDL: 79.89
Total CHOL/HDL Ratio: 4
Triglycerides: 122 mg/dL (ref 0.0–149.0)
VLDL: 24.4 mg/dL (ref 0.0–40.0)

## 2019-06-03 LAB — HEMOGLOBIN A1C: Hgb A1c MFr Bld: 6.7 % — ABNORMAL HIGH (ref 4.6–6.5)

## 2019-06-03 LAB — PSA: PSA: 2.19 ng/mL (ref 0.10–4.00)

## 2019-06-03 LAB — C-REACTIVE PROTEIN: CRP: <1 mg/dL (ref 0.5–20.0)

## 2019-06-03 NOTE — Addendum Note (Signed)
Addended by: Jacqualin Combes on: 06/03/2019 08:28 AM   Modules accepted: Orders

## 2019-06-09 ENCOUNTER — Other Ambulatory Visit: Payer: Self-pay

## 2019-06-09 ENCOUNTER — Encounter: Payer: Self-pay | Admitting: *Deleted

## 2019-06-09 ENCOUNTER — Ambulatory Visit: Payer: Medicare Other | Admitting: Primary Care

## 2019-06-09 ENCOUNTER — Ambulatory Visit (INDEPENDENT_AMBULATORY_CARE_PROVIDER_SITE_OTHER): Payer: Medicare Other | Admitting: Primary Care

## 2019-06-09 VITALS — BP 130/72 | HR 77 | Temp 97.7°F | Ht 74.0 in | Wt 225.8 lb

## 2019-06-09 DIAGNOSIS — Z23 Encounter for immunization: Secondary | ICD-10-CM

## 2019-06-09 DIAGNOSIS — Z0001 Encounter for general adult medical examination with abnormal findings: Secondary | ICD-10-CM | POA: Insufficient documentation

## 2019-06-09 DIAGNOSIS — Z0000 Encounter for general adult medical examination without abnormal findings: Secondary | ICD-10-CM | POA: Diagnosis not present

## 2019-06-09 DIAGNOSIS — R2681 Unsteadiness on feet: Secondary | ICD-10-CM

## 2019-06-09 DIAGNOSIS — I10 Essential (primary) hypertension: Secondary | ICD-10-CM | POA: Diagnosis not present

## 2019-06-09 DIAGNOSIS — D126 Benign neoplasm of colon, unspecified: Secondary | ICD-10-CM

## 2019-06-09 DIAGNOSIS — I2510 Atherosclerotic heart disease of native coronary artery without angina pectoris: Secondary | ICD-10-CM

## 2019-06-09 DIAGNOSIS — Z125 Encounter for screening for malignant neoplasm of prostate: Secondary | ICD-10-CM

## 2019-06-09 DIAGNOSIS — E119 Type 2 diabetes mellitus without complications: Secondary | ICD-10-CM

## 2019-06-09 MED ORDER — ZOSTER VAC RECOMB ADJUVANTED 50 MCG/0.5ML IM SUSR: 0.5000 mL | Freq: Once | INTRAMUSCULAR | 1 refills | Status: AC

## 2019-06-09 NOTE — Assessment & Plan Note (Signed)
Compliant to statin, clopidogrel.  LDL of 55 on recent labs.  Continue same.

## 2019-06-09 NOTE — Assessment & Plan Note (Addendum)
Stable in the office today.  Continue losartan and HCTZ.  CMP reviewed and stable.

## 2019-06-09 NOTE — Assessment & Plan Note (Signed)
History of benign polyps, last colonoscopy in 2013. He is not due to return for repeat colonoscopy.

## 2019-06-09 NOTE — Assessment & Plan Note (Signed)
Noted on recent labs with A1C of 6.7. History of prediabetes over the years.  At this point he is overall controlled given age, so we will not pursue aggressive treatment.   Discussed to work on diet, start with regular exercise.  Managed on statin and ARB. Pneumonia vaccination UTD.  Repeat A1C in 3 months.

## 2019-06-09 NOTE — Assessment & Plan Note (Addendum)
Following with neurology, compliant to oral B12 daily. Overall doing well, tries to make larger strides.

## 2019-06-09 NOTE — Assessment & Plan Note (Signed)
PSA normal and stable.

## 2019-06-09 NOTE — Patient Instructions (Signed)
Go to the lab in three months for your diabetes check.   Take the shingles vaccination to your pharmacy for administration.  It is important that you improve your diet. Please limit carbohydrates in the form of white bread, rice, pasta, sweets, fast food, fried food, sugary drinks, etc. Increase your consumption of fresh fruits and vegetables, whole grains, lean protein.  Ensure you are consuming 64 ounces of water daily.  Start exercising. You should be getting 150 minutes of exercise weekly.  It was a pleasure to see you today!   Type 2 Diabetes Mellitus, Diagnosis, Adult Type 2 diabetes (type 2 diabetes mellitus) is a long-term (chronic) disease. It may be caused by one or both of these problems:  Your pancreas does not make enough of a hormone called insulin.  Your body does not react in a normal way to insulin that it makes. Insulin lets sugars (glucose) go into cells in your body. This gives you energy. If you have type 2 diabetes, sugars cannot get into cells. This causes high blood sugar (hyperglycemia). Your doctor will set treatment goals for you. Generally, you should have these blood sugar levels:  Before meals (preprandial): 80-130 mg/dL (4.4-7.2 mmol/L).  After meals (postprandial): below 180 mg/dL (10 mmol/L).  A1c (hemoglobin A1c) level: less than 7%. Follow these instructions at home: Questions to ask your doctor  You may want to ask these questions: ? Do I need to meet with a diabetes educator? ? Where can I find a support group for people with diabetes? ? What equipment will I need to care for myself at home? ? What diabetes medicines do I need? When should I take them? ? How often do I need to check my blood sugar? ? What number can I call if I have questions? ? When is my next doctor's visit? General instructions  Take over-the-counter and prescription medicines only as told by your doctor.  Keep all follow-up visits as told by your doctor. This is  important. Contact a doctor if:  Your blood sugar is at or above 240 mg/dL (13.3 mmol/L) for 2 days in a row.  You have been sick for 2 days or more, and you are not getting better.  You have had a fever for 2 days or more, and you are not getting better.  You have any of these problems for more than 6 hours: ? You cannot eat or drink. ? You feel sick to your stomach (nauseous). ? You throw up (vomit). ? You have watery poop (diarrhea). Get help right away if:  Your blood sugar is lower than 54 mg/dL (3 mmol/L).  You get confused.  You have trouble: ? Thinking clearly. ? Breathing.  You have moderate or large ketone levels in your pee (urine). Summary  Type 2 diabetes is a long-term (chronic) disease. Your pancreas may not make enough of a hormone called insulin, or your body may not react normally to insulin that it makes.  Take over-the-counter and prescription medicines only as told by your doctor.  Keep all follow-up visits as told by your doctor. This is important. This information is not intended to replace advice given to you by your health care provider. Make sure you discuss any questions you have with your health care provider. Document Released: 09/25/2008 Document Revised: 07/18/2017 Document Reviewed: 01/20/2016 Elsevier Interactive Patient Education  2019 [employer].

## 2019-06-09 NOTE — Progress Notes (Signed)
Subjective:    Patient ID: [name] [name], male    DOB: [birth year], 78 y.o.   MRN: [id number]  HPI  [name] [name] is a 78 year old male who presents today for complete physical.  Immunizations: -Tetanus: Completed in 2011 -Influenza: Completes annually  -Pneumonia: Completed last in 2018 -Shingles: Never completed   Diet: He endorses a fair diet Breakfast: Toast, peanut butter sandwich, cereal Lunch: Skips sometimes Dinner: Protein, vegetable, starch Snacks: Cheese, crackers Desserts: Cookies Beverages: Soda, coffee, sweet tea, water, milk  Exercise: He is not exercising, active in the yard  Eye exam: Completed in October 2020 Dental exam: Completes semi-annually  Colonoscopy: Completed in 2013, does not need to return  PSA: 2.19 in June 2020   BP Readings from Last 3 Encounters:  06/09/19 130/72  04/20/19 134/68  03/20/19 128/70      Review of Systems  Constitutional: Negative for unexpected weight change.  HENT: Negative for rhinorrhea.   Respiratory: Negative for cough and shortness of breath.   Cardiovascular: Negative for chest pain.  Gastrointestinal: Negative for constipation and diarrhea.  Genitourinary: Negative for difficulty urinating.  Musculoskeletal: Negative for arthralgias.  Skin: Negative for rash.  Allergic/Immunologic: Negative for environmental allergies.  Neurological: Negative for dizziness, numbness and headaches.  Psychiatric/Behavioral: The patient is not nervous/anxious.        Past Medical History:  Diagnosis Date  . Carotid artery occlusion   . Cerebrovascular disease   . Coronary artery disease    s/p CABG 2005; sees Dr Johnsie Cancel yearly  . Gynecomastia   . 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 Hypercholesterolemia   . Hypertension   . Keloid   . Neurodermatitis   . Overweight(278.02)   . Personal history of colonic polyps 10/08/2006   tubular adenomas  . Renal insufficiency   . Shoulder pain   . Transient ischemic attack 2006   "lasted  ~ 5 seconds"  . Vitamin D deficiency      Social History   Socioeconomic History  . Marital status: Married    Spouse name: [name]  . Number of children: 1  . Years of education: Not on file  . Highest education level: Some college, no degree  Occupational History  . Occupation: retired  Scientific laboratory technician  . Financial resource strain: Not on file  . Food insecurity:    Worry: Not on file    Inability: Not on file  . Transportation needs:    Medical: Not on file    Non-medical: Not on file  Tobacco Use  . Smoking status: Former Research scientist (life sciences)  . Smokeless tobacco: Former Systems developer    Types: Snuff  . Tobacco comment: uses dip 3-4 times per week  Substance and Sexual Activity  . Alcohol use: No    Alcohol/week: 0.0 standard drinks  . Drug use: No  . Sexual activity: Yes  Lifestyle  . Physical activity:    Days per week: Not on file    Minutes per session: Not on file  . Stress: Not on file  Relationships  . Social connections:    Talks on phone: Not on file    Gets together: Not on file    Attends religious service: Not on file    Active member of club or organization: Not on file    Attends meetings of clubs or organizations: Not on file    Relationship status: Not on file  . Intimate partner violence:    Fear of current or ex partner: Not on  file    Emotionally abused: Not on file    Physically abused: Not on file    Forced sexual activity: Not on file  Other Topics Concern  . Not on file  Social History Narrative   Retired.   Once worked for the [employer].   Married.   Enjoys reading, spending time with family.     Past Surgical History:  Procedure Laterality Date  . CARDIAC CATHETERIZATION  02/2004   "tried to stent; couldn't"  . CAROTID ENDARTERECTOMY Right 08/26/2015  . CORONARY ANGIOPLASTY    . CORONARY ARTERY BYPASS GRAFT  Feb. 2005   4 vessel  . ENDARTERECTOMY Right 08/26/2015   Procedure: Right Carotid ENDARTERECTOMY with Patch Angioplasty ;  Surgeon: Rosetta Posner, MD;   Location: [facility];  Service: Vascular;  Laterality: Right;  . 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 Dessie Coma  . KELOID EXCISION    . PILONIDAL CYST EXCISION  1989    Family History  Problem Relation Age of Onset  . Heart disease Mother        Before age 70  . Diabetes Mother   . Kidney disease Mother   . Heart attack Mother 54  . Lung cancer Father 77  . Diabetes Brother   . Heart disease Brother   . Heart disease Brother   . Arthritis Brother   . Diabetes Sister   . Fibromyalgia Sister   . Lung cancer Paternal Uncle        questionable as to if it was lung ca  . Colon cancer Neg Hx     Allergies  Allergen Reactions  . Niacin Other (See Comments)    REACTION: intol to NIACIN w/ headaches REACTION: intol to NIACIN w/ headaches    Current Outpatient Medications on File Prior to Visit  Medication Sig Dispense Refill  . aspirin 81 MG tablet Take 81 mg by mouth daily.      [name] atorvastatin (LIPITOR) 20 MG tablet TAKE 1/2 TABLET BY MOUTH DAILY. TAKE AS DIRECTED 45 tablet 0  . Cholecalciferol (VITAMIN D) 2000 UNITS CAPS Take 1 capsule by mouth daily.     . clopidogrel (PLAVIX) 75 MG tablet TAKE 1 TABLET BY MOUTH EVERY DAY 90 tablet 3  . hydrochlorothiazide (MICROZIDE) 12.5 MG capsule TAKE ONE CAPSULE BY MOUTH EVERY DAY 90 capsule 3  . losartan (COZAAR) 25 MG tablet Take 2 tablets (50 mg total) by mouth daily. 180 tablet 2  . nitroGLYCERIN (NITROSTAT) 0.4 MG SL tablet Place 1 tablet (0.4 mg total) under the tongue every 5 (five) minutes as needed for chest pain. 25 tablet 3  . vitamin B-12 (CYANOCOBALAMIN) 1000 MCG tablet Take 1,000 mcg by mouth daily.     No current facility-administered medications on file prior to visit.     BP 130/72   Pulse 77   Temp 97.7 F (36.5 C) (Tympanic)   Ht 6\' 2"  (1.88 m)   Wt 225 lb 12 oz (102.4 kg)   SpO2 98%   BMI 28.98 kg/m    Objective:   Physical Exam  Constitutional: He is oriented to person, place, and time. He appears  well-nourished.  HENT:  Mouth/Throat: No oropharyngeal exudate.  Eyes: Pupils are equal, round, and reactive to light. EOM are normal.  Neck: Neck supple. No thyromegaly present.  Cardiovascular: Normal rate and regular rhythm.  Respiratory: Effort normal and breath sounds normal.  GI: Soft. Bowel sounds are normal. There is no abdominal tenderness.  Musculoskeletal:     Comments: Stable gait, ambulatory in office without assistive device.   Neurological: He is alert and oriented to person, place, and time.  Skin: Skin is warm and dry.  Psychiatric: He has a normal mood and affect.           Assessment & Plan:

## 2019-06-09 NOTE — Assessment & Plan Note (Signed)
Immunizations UTD, Rx for Shingrix provided today. PSA UTD and normal.  Colonoscopy UTD, doesn't need to repeat. Discussed to limit sweets, sugary drinks, processed food. Exam unremarkable.  Labs reviewed.  Follow up in 1 year for CPE.

## 2019-06-12 IMAGING — MR MR LUMBAR SPINE W/O CM
4 of 5 series · 26 of 48 positions shown · non-contrast
Comparison: None.

CLINICAL DATA: Lumbosacral radiculopathy. Difficulty walking for 6
months.

EXAM:
MRI LUMBAR SPINE WITHOUT CONTRAST
TECHNIQUE: Multiplanar, multisequence MR imaging of the lumbar spine was
performed. No intravenous contrast was administered.

[Series 3: T2 · sagittal · 4.0mm · 0.55mm/px · 6 of 13 slices shown (1 of 2)]
[im 1/13]
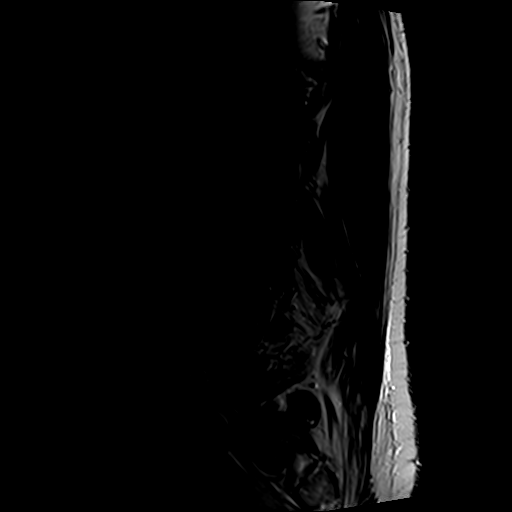
[im 3/13]
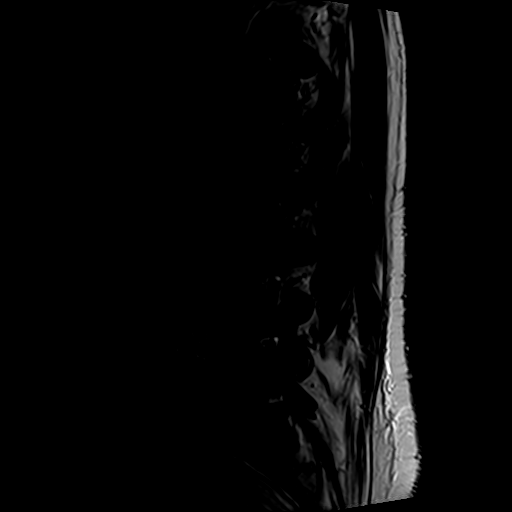
[im 5/13]
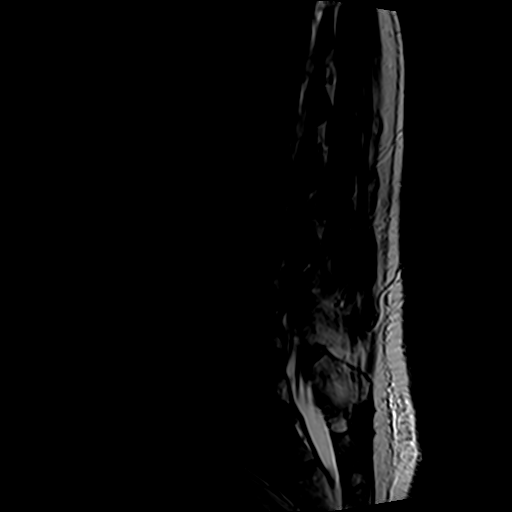
[im 8/13]
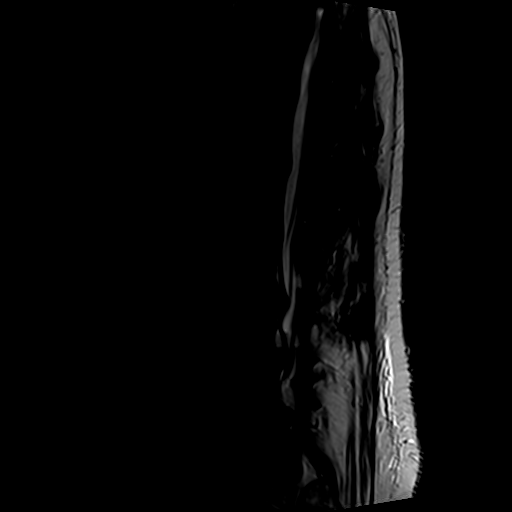
[im 10/13]
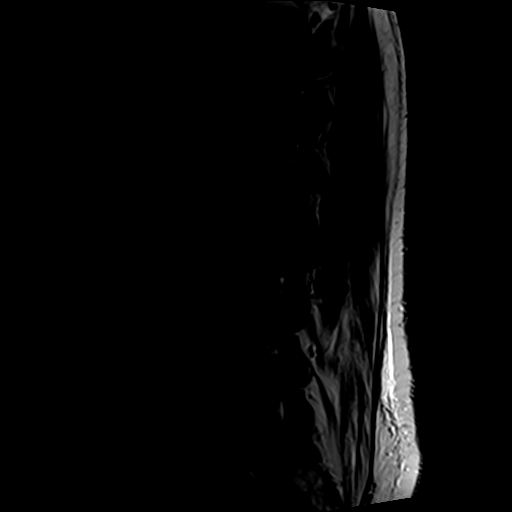
[im 13/13]
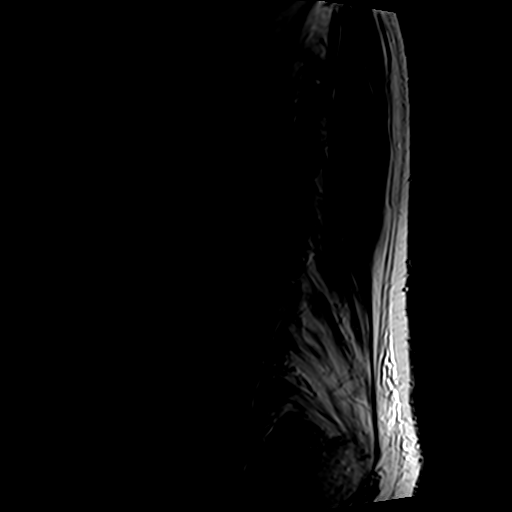

[Series 5: T1 · sagittal · 4.0mm · 0.55mm/px · 5 of 13 slices shown (1 of 2)]
[im 1/13]
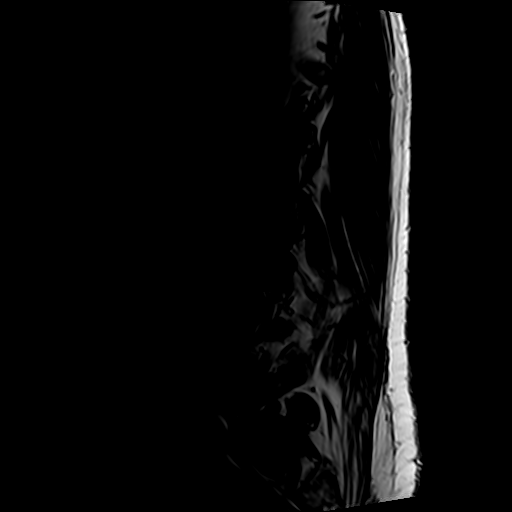
[im 4/13]
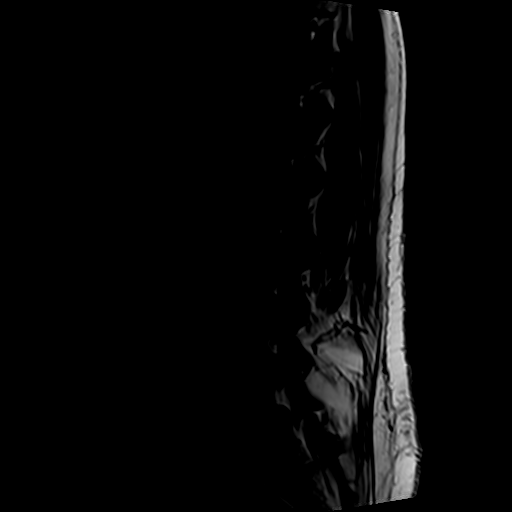
[im 7/13]
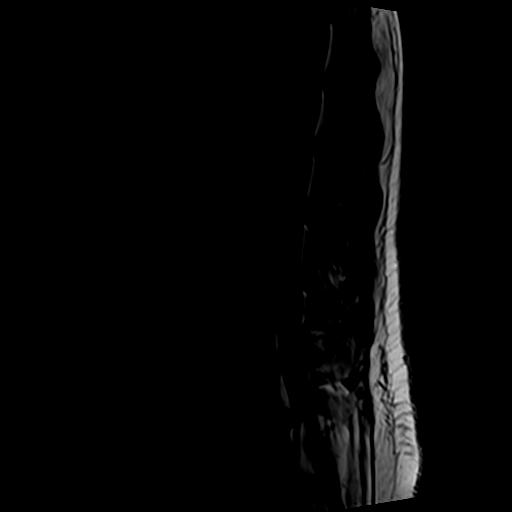
[im 10/13]
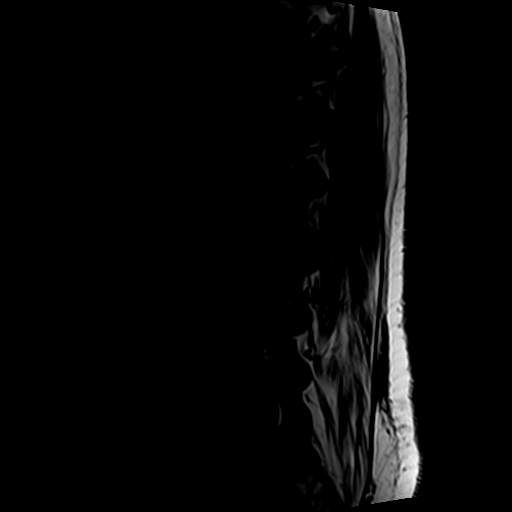
[im 13/13]
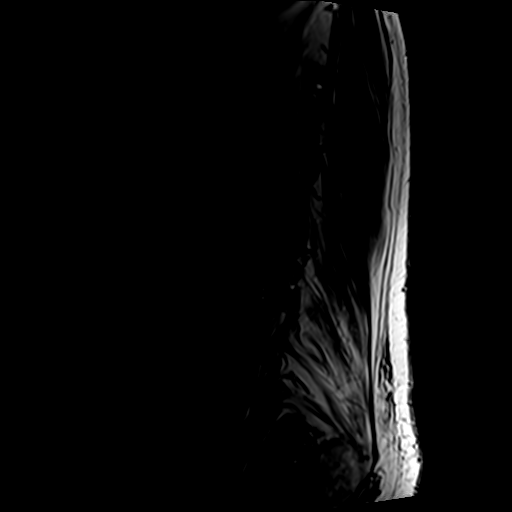

[Series 6: T2 · axial · 4.0mm · 0.70mm/px · z∈[-124,+94]mm · 10 of 39 slices shown (2 of 2)]
[im 3/39]
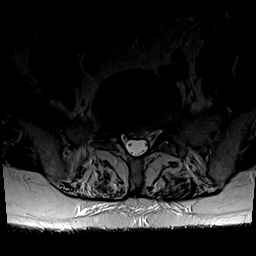
[im 6/39]
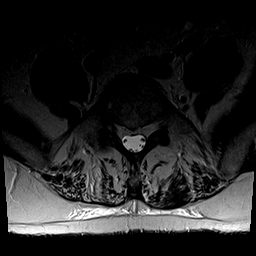
[im 8/39]
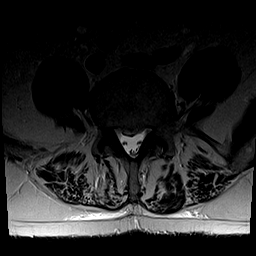
[im 13/39]
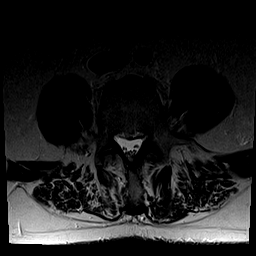
[im 18/39]
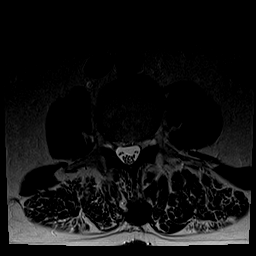
[im 21/39]
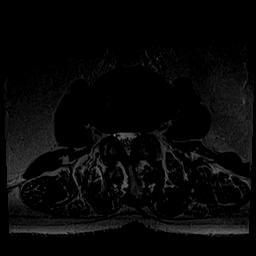
[im 23/39]
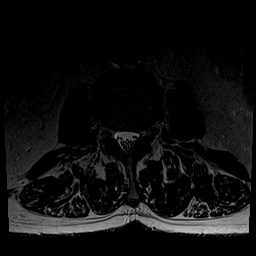
[im 28/39]
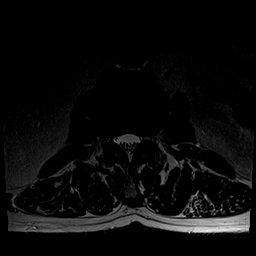
[im 33/39]
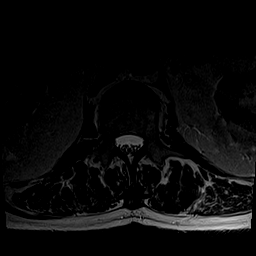
[im 39/39]
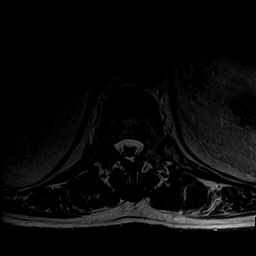

[Series 7: T1 · axial · 4.0mm · 0.35mm/px · z∈[-124,+63]mm · 5 of 39 slices shown (2 of 2)]
[im 3/39]
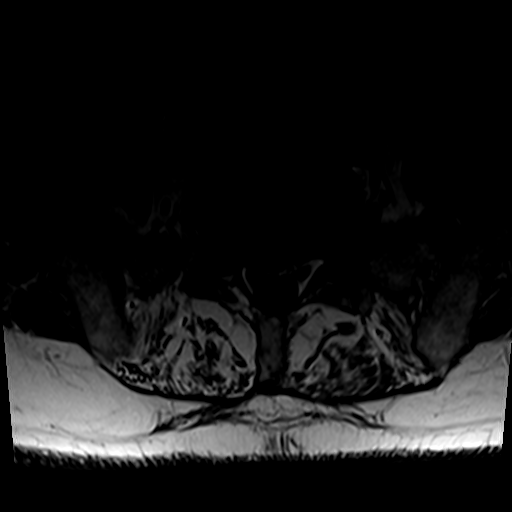
[im 6/39]
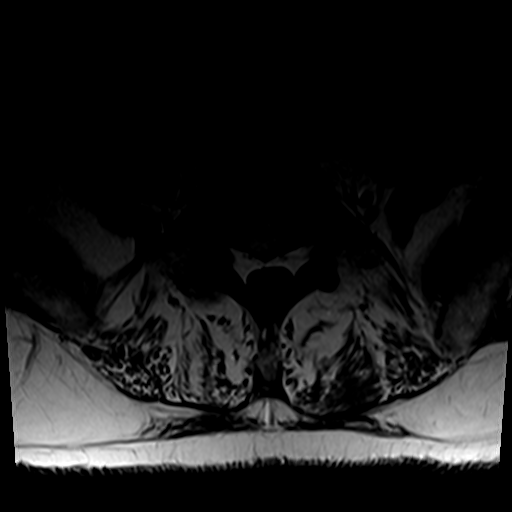
[im 8/39]
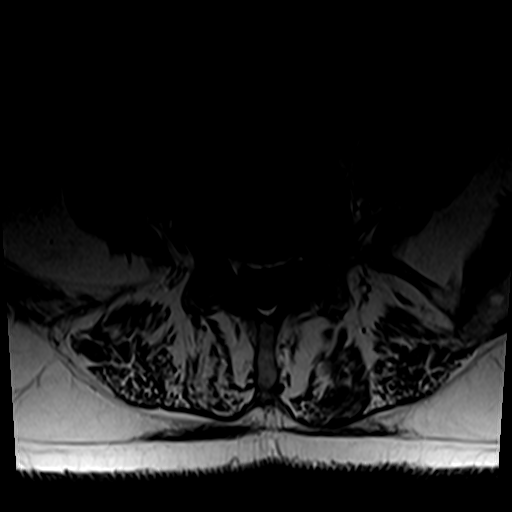
[im 21/39]
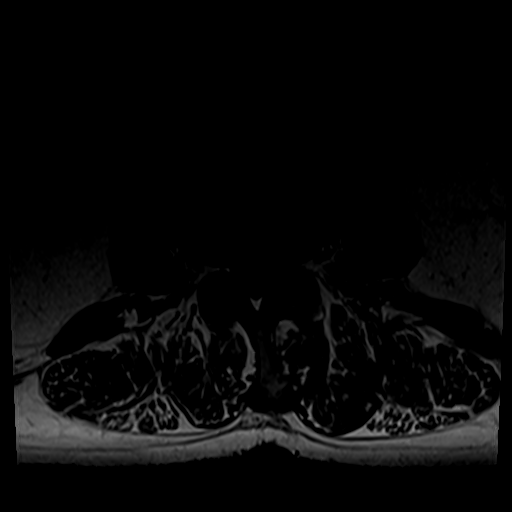
[im 33/39]
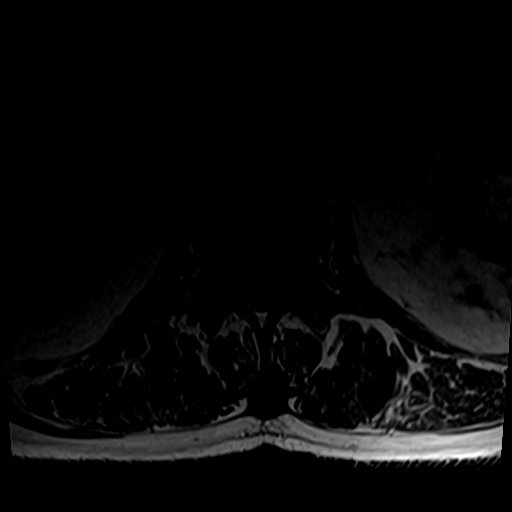

[26 of 48 positions shown; findings below may reference images not displayed]

FINDINGS: Segmentation:  5 lumbar type vertebral bodies

Alignment:  Mild dextrocurvature

Vertebrae:  No fracture, evidence of discitis, or bone lesion.

Conus medullaris and cauda equina: Conus extends to the L1 level.
Conus and cauda equina appear normal.

Paraspinal and other soft tissues: Negative

Disc levels:

T12- L1: Unremarkable.

L1-L2: Unremarkable.

L2-L3: Minor spondylosis and annulus bulging. Mild facet spurring.
No impingement

L3-L4: Disc narrowing and bulging with downward pointing central
disc protrusion contacting both L4 nerve roots. Minor facet
spurring. The foramina are patent

L4-L5: Disc narrowing with endplate spurring. There is a central
disc protrusion with buttressing osteophyte on axial slices,
contacting both L5 nerve roots without compression. The foramina are
patent.

L5-S1:Central disc protrusion with buttressing osteophytes. The
canal and foramina are patent.
IMPRESSION: 1. L3-4 central disc protrusion that could irritate either L4 nerve
root.
2. L4-5 and L5-S1 small and noncompressive central disc protrusions
with buttressing osteophytes.
3. Normal conus signal.

## 2019-08-08 ENCOUNTER — Other Ambulatory Visit: Payer: Self-pay | Admitting: Primary Care

## 2019-08-10 NOTE — Telephone Encounter (Signed)
Have not been prescribed by Allie Bossier. Last appointment on 06/09/2019 CPE. No future appointment

## 2019-08-13 ENCOUNTER — Encounter: Payer: Self-pay | Admitting: Primary Care

## 2019-08-13 ENCOUNTER — Ambulatory Visit (INDEPENDENT_AMBULATORY_CARE_PROVIDER_SITE_OTHER): Payer: Medicare Other | Admitting: Primary Care

## 2019-08-13 ENCOUNTER — Other Ambulatory Visit: Payer: Self-pay

## 2019-08-13 VITALS — BP 138/80 | HR 78 | Temp 97.9°F | Ht 74.0 in | Wt 227.0 lb

## 2019-08-13 DIAGNOSIS — R600 Localized edema: Secondary | ICD-10-CM | POA: Insufficient documentation

## 2019-08-13 DIAGNOSIS — I872 Venous insufficiency (chronic) (peripheral): Secondary | ICD-10-CM

## 2019-08-13 LAB — BASIC METABOLIC PANEL
BUN: 16 mg/dL (ref 6–23)
CO2: 29 mEq/L (ref 19–32)
Calcium: 8.7 mg/dL (ref 8.4–10.5)
Chloride: 100 mEq/L (ref 96–112)
Creatinine, Ser: 1.12 mg/dL (ref 0.40–1.50)
GFR: 63.78 mL/min (ref >60.00)
Glucose, Bld: 121 mg/dL — ABNORMAL HIGH (ref 70–99)
Potassium: 3.7 mEq/L (ref 3.5–5.1)
Sodium: 137 mEq/L (ref 135–145)

## 2019-08-13 LAB — BRAIN NATRIURETIC PEPTIDE: Pro B Natriuretic peptide (BNP): 49 pg/mL (ref 0.0–100.0)

## 2019-08-13 NOTE — Assessment & Plan Note (Signed)
Suspect this to be the cause for ankle and foot swelling, especially given his dietary changes and sedentary lifestyle.  Strongly advised that he elevate his legs anytime he's resting. Encouraged increased activity. Recommended compression socks.  Check labs today including BNP and BMP. Diabetes under good control in June 2020. BP stable today, home readings better. He will update if no improvement.

## 2019-08-13 NOTE — Assessment & Plan Note (Signed)
Suspect venous insufficiency to be the cause for ankle and foot swelling, especially given his dietary changes and sedentary lifestyle. No evidence for DVT or cellulitis.   Strongly advised that he elevate his legs anytime he's resting. Encouraged increased activity. Recommended compression socks.  Check labs today including BNP and BMP. Diabetes under good control in June 2020. BP stable today, home readings better. He will update if no improvement.

## 2019-08-13 NOTE — Patient Instructions (Addendum)
Stop by the lab prior to leaving today. I will notify you of your results once received.   Elevate your legs anytime you are resting/sitting.  Purchase some compression socks/stockings to help with circulation.  You must get some exercise in order to help reduce swelling.   Please update me in a few weeks if no improvement.  It was a pleasure to see you today!   Chronic Venous Insufficiency Chronic venous insufficiency is a condition where the leg veins cannot effectively pump blood from the legs to the heart. This happens when the vein walls are either stretched, weakened, or damaged, or when the valves inside the vein are damaged. With the right treatment, you should be able to continue with an active life. This condition is also called venous stasis. What are the causes? Common causes of this condition include:  High blood pressure inside the veins (venous hypertension).  Sitting or standing too long, causing increased blood pressure in the leg veins.  A blood clot that blocks blood flow in a vein (deep vein thrombosis, DVT).  Inflammation of a vein (phlebitis) that causes a blood clot to form.  Tumors in the pelvis that cause blood to back up. What increases the risk? The following factors may make you more likely to develop this condition:  Having a family history of this condition.  Obesity.  Pregnancy.  Living without enough regular physical activity or exercise (sedentary lifestyle).  Smoking.  Having a job that requires long periods of standing or sitting in one place.  Being a certain age. Women in their 3s and 63s and men in their 57s are more likely to develop this condition. What are the signs or symptoms? Symptoms of this condition include:  Veins that are enlarged, bulging, or twisted (varicose veins).  Skin breakdown or ulcers.  Reddened skin or dark discoloration of skin on the leg between the knee and ankle.  Brown, smooth, tight, and painful  skin just above the ankle, usually on the inside of the leg (lipodermatosclerosis).  Swelling of the legs. How is this diagnosed? This condition may be diagnosed based on:  Your medical history.  A physical exam.  Tests, such as: ? A procedure that creates an image of a blood vessel and nearby organs and provides information about blood flow through the blood vessel (duplex ultrasound). ? A procedure that tests blood flow (plethysmography). ? A procedure that looks at the veins using X-ray and dye (venogram). How is this treated? The goals of treatment are to help you return to an active life and to minimize pain or disability. Treatment depends on the severity of your condition, and it may include:  Wearing compression stockings. These can help relieve symptoms and help prevent your condition from getting worse. However, they do not cure the condition.  Sclerotherapy. This procedure involves an injection of a solution that shrinks damaged veins.  Surgery. This may involve: ? Removing a diseased vein (vein stripping). ? Cutting off blood flow through the vein (laser ablation surgery). ? Repairing or reconstructing a valve within the affected vein. Follow these instructions at home:      Wear compression stockings as told by your health care provider. These stockings help to prevent blood clots and reduce swelling in your legs.  Take over-the-counter and prescription medicines only as told by your health care provider.  Stay active by exercising, walking, or doing different activities. Ask your health care provider what activities are safe for you and how much  exercise you need.  Drink enough fluid to keep your urine pale yellow.  Do not use any products that contain nicotine or tobacco, such as cigarettes, e-cigarettes, and chewing tobacco. If you need help quitting, ask your health care provider.  Keep all follow-up visits as told by your health care provider. This is  important. Contact a health care provider if you:  Have redness, swelling, or more pain in the affected area.  See a red streak or line that goes up or down from the affected area.  Have skin breakdown or skin loss in the affected area, even if the breakdown is small.  Get an injury in the affected area. Get help right away if:  You get an injury and an open wound in the affected area.  You have: ? Severe pain that does not get better with medicine. ? Sudden numbness or weakness in the foot or ankle below the affected area. ? Trouble moving your foot or ankle. ? A fever. ? Worse or persistent symptoms. ? Chest pain. ? Shortness of breath. Summary  Chronic venous insufficiency is a condition where the leg veins cannot effectively pump blood from the legs to the heart.  Chronic venous insufficiency occurs when the vein walls become stretched, weakened, or damaged, or when valves within the vein are damaged.  Treatment depends on how severe your condition is. It often involves wearing compression stockings and may involve having a procedure.  Make sure you stay active by exercising, walking, or doing different activities. Ask your health care provider what activities are safe for you and how much exercise you need. This information is not intended to replace advice given to you by your health care provider. Make sure you discuss any questions you have with your health care provider. Document Released: 04/22/2007 Document Revised: 09/09/2018 Document Reviewed: 09/09/2018 Elsevier Patient Education  2020 [employer].

## 2019-08-13 NOTE — Progress Notes (Signed)
Subjective:    Patient ID: [name] [name], male    DOB: 1941/04/08, 78 y.o.   MRN: [id number]  HPI  [name] [name] is a 78 year old male with a history of hypertension, coronary atherosclerosis, venous insufficiency, carotid artery disease, type 2 diabetes who presents today with a chief complaint of lower extremity edema.   His swelling is located to the bilateral feet and ankles which began about two weeks ago. His swelling is slightly worse to the right ankle and foot. He has been elevating his feet at times.   He is checking his BP at home which is running 130-135/60's-70's. He is not exercising and is more sedentary than usual. He is reading a lot more and is not elevating his legs when doing so. He endorses eating more hamburger/pasta than usual.   He denies chest pain, shortness of breath, dizziness, redness, recent wounds. His swelling is reduced when first waking up in the morning.   BP Readings from Last 3 Encounters:  08/13/19 138/80  06/09/19 130/72  04/20/19 134/68     Review of Systems  Respiratory: Negative for shortness of breath.   Cardiovascular: Positive for leg swelling. Negative for chest pain.  Neurological: Negative for dizziness.       Past Medical History:  Diagnosis Date  . Carotid artery occlusion   . Cerebrovascular disease   . Coronary artery disease    s/p CABG 2005; sees Dr Johnsie Cancel yearly  . Gynecomastia   . 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 Hypercholesterolemia   . Hypertension   . Keloid   . Neurodermatitis   . Overweight(278.02)   . Personal history of colonic polyps 10/08/2006   tubular adenomas  . Renal insufficiency   . Shoulder pain   . Transient ischemic attack 2006   "lasted ~ 5 seconds"  . Vitamin D deficiency      Social History   Socioeconomic History  . Marital status: Married    Spouse name: [name]  . Number of children: 1  . Years of education: Not on file  . Highest education level: Some college, no degree  Occupational  History  . Occupation: retired  Scientific laboratory technician  . Financial resource strain: Not on file  . Food insecurity    Worry: Not on file    Inability: Not on file  . Transportation needs    Medical: Not on file    Non-medical: Not on file  Tobacco Use  . Smoking status: Former Research scientist (life sciences)  . Smokeless tobacco: Former Systems developer    Types: Snuff  . Tobacco comment: uses dip 3-4 times per week  Substance and Sexual Activity  . Alcohol use: No    Alcohol/week: 0.0 standard drinks  . Drug use: No  . Sexual activity: Yes  Lifestyle  . Physical activity    Days per week: Not on file    Minutes per session: Not on file  . Stress: Not on file  Relationships  . Social Herbalist on phone: Not on file    Gets together: Not on file    Attends religious service: Not on file    Active member of club or organization: Not on file    Attends meetings of clubs or organizations: Not on file    Relationship status: Not on file  . Intimate partner violence    Fear of current or ex partner: Not on file    Emotionally abused: Not on file    Physically abused:  Not on file    Forced sexual activity: Not on file  Other Topics Concern  . Not on file  Social History Narrative   Retired.   Once worked for the [employer].   Married.   Enjoys reading, spending time with family.     Past Surgical History:  Procedure Laterality Date  . CARDIAC CATHETERIZATION  02/2004   "tried to stent; couldn't"  . CAROTID ENDARTERECTOMY Right 08/26/2015  . CORONARY ANGIOPLASTY    . CORONARY ARTERY BYPASS GRAFT  Feb. 2005   4 vessel  . ENDARTERECTOMY Right 08/26/2015   Procedure: Right Carotid ENDARTERECTOMY with Patch Angioplasty ;  Surgeon: Rosetta Posner, MD;  Location: [facility];  Service: Vascular;  Laterality: Right;  . 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 Dessie Coma  . KELOID EXCISION    . PILONIDAL CYST EXCISION  1989    Family History  Problem Relation Age of Onset  . Heart disease Mother        Before age  12  . Diabetes Mother   . Kidney disease Mother   . Heart attack Mother 86  . Lung cancer Father 71  . Diabetes Brother   . Heart disease Brother   . Heart disease Brother   . Arthritis Brother   . Diabetes Sister   . Fibromyalgia Sister   . Lung cancer Paternal Uncle        questionable as to if it was lung ca  . Colon cancer Neg Hx     Allergies  Allergen Reactions  . Niacin Other (See Comments)    REACTION: intol to NIACIN w/ headaches REACTION: intol to NIACIN w/ headaches    Current Outpatient Medications on File Prior to Visit  Medication Sig Dispense Refill  . atorvastatin (LIPITOR) 20 MG tablet TAKE 1/2 TABLET BY MOUTH DAILY 15 tablet 11  . Cholecalciferol (VITAMIN D) 2000 UNITS CAPS Take 1 capsule by mouth daily.     . clopidogrel (PLAVIX) 75 MG tablet TAKE 1 TABLET BY MOUTH EVERY DAY 90 tablet 3  . hydrochlorothiazide (MICROZIDE) 12.5 MG capsule TAKE ONE CAPSULE BY MOUTH EVERY DAY 90 capsule 3  . losartan (COZAAR) 25 MG tablet Take 2 tablets (50 mg total) by mouth daily. 180 tablet 2  . vitamin B-12 (CYANOCOBALAMIN) 1000 MCG tablet Take 1,000 mcg by mouth daily.    . nitroGLYCERIN (NITROSTAT) 0.4 MG SL tablet Place 1 tablet (0.4 mg total) under the tongue every 5 (five) minutes as needed for chest pain. (Patient not taking: Reported on 08/13/2019) 25 tablet 3   No current facility-administered medications on file prior to visit.     BP 138/80   Pulse 78   Temp 97.9 F (36.6 C) (Temporal)   Ht 6\' 2"  (1.88 m)   Wt 227 lb (103 kg)   SpO2 98%   BMI 29.15 kg/m    Objective:   Physical Exam  Constitutional: He appears well-nourished.  Neck: Neck supple.  Cardiovascular: Normal rate and regular rhythm.  Pulses:      Dorsalis pedis pulses are 2+ on the right side and 2+ on the left side.       Posterior tibial pulses are 2+ on the right side and 2+ on the left side.  Mild lower extremity edema to bilateral ankles and feet, trace pitting. Worse to left than  right. Pedal pulses palpable and intact.  Respiratory: Effort normal and breath sounds normal.  Skin: Skin is warm and dry. No  erythema.  No open wounds           Assessment & Plan:

## 2019-08-16 ENCOUNTER — Other Ambulatory Visit: Payer: Self-pay | Admitting: Cardiovascular Disease

## 2019-09-14 ENCOUNTER — Other Ambulatory Visit: Payer: Self-pay | Admitting: Cardiovascular Disease

## 2019-10-12 ENCOUNTER — Telehealth: Payer: Self-pay | Admitting: Neurology

## 2019-10-12 ENCOUNTER — Other Ambulatory Visit (INDEPENDENT_AMBULATORY_CARE_PROVIDER_SITE_OTHER): Payer: Medicare Other

## 2019-10-12 DIAGNOSIS — E119 Type 2 diabetes mellitus without complications: Secondary | ICD-10-CM

## 2019-10-12 DIAGNOSIS — E538 Deficiency of other specified B group vitamins: Secondary | ICD-10-CM

## 2019-10-12 LAB — HEMOGLOBIN A1C: Hgb A1c MFr Bld: 6.9 % — ABNORMAL HIGH (ref 4.6–6.5)

## 2019-10-12 NOTE — Telephone Encounter (Signed)
4/20 appt Dr. Tomi Likens put patient on B12 1082mcg daily and follow up in 6 mos. No future lab orders written at that time.  Dr. Tomi Likens - do you want him to have B12 lab drawn before sees you next week?

## 2019-10-12 NOTE — Telephone Encounter (Signed)
Patient is calling in to find out if he needs labs before his appt next Tuesday 10/20/19. The B12 labs. Please call him and let him know. Thanks!

## 2019-10-13 NOTE — Telephone Encounter (Signed)
Yes, we can check another level

## 2019-10-13 NOTE — Progress Notes (Signed)
Patient ID: [name] [name], male   DOB: 21-Dec-1941, 78 y.o.   MRN: [id number]     Cardiology Office Note   Date:  10/16/2019   ID:  [name] [name], DOB January 14, 1941, MRN [id number]  PCP:  [name] Koch, NP  Cardiologist:  [name] [name]    No chief complaint on file.     History of Present Illness: [name] [name] is a 78 y.o. male  known coronary disease with previous coronary bypass surgery in 2005. he has normal LV function. August 2016 Had right CEA with [name] [name] 08/26/15  Last ischemic evaluation at that time 2016 EF 56% low risk small inferior wall infarct no ischemia.  Reviewed duplex 10/15/17 plaque no significant stenosis   On statin with LDL at goal  No angina palpitations dyspnea or syncope  More broad based gait seen by [name] [name] neuro Doubts Parkinson's getting B12 shots   Myovue low risk 09/09/18  Nuclear stress EF: 64%.  There was no ST segment deviation noted during stress.  There is a small defect of mild severity present in the apical inferior location. The defect is non-reversible. In the setting of normal LVF this is consistent with diaphragmatic attenuation artifact. No ischemia noted.  The left ventricular ejection fraction is normal (55-65%).  This is a low risk study.    Weight is up as is A1c.  Needs f/u carotid duplex He is on plavix due to previous stroke Discussed continuing this In lieu of asa   Past Medical History:  Diagnosis Date  . Carotid artery occlusion   . Cerebrovascular disease   . Coronary artery disease    s/p CABG 2005; sees [name] [name] yearly  . Gynecomastia   . 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 Hypercholesterolemia   . Hypertension   . Keloid   . Neurodermatitis   . Overweight(278.02)   . Personal history of colonic polyps 10/08/2006   tubular adenomas  . Renal insufficiency   . Shoulder pain   . Transient ischemic attack 2006   "lasted ~ 5 seconds"  . Vitamin D deficiency     Past Surgical History:  Procedure Laterality  Date  . CARDIAC CATHETERIZATION  02/2004   "tried to stent; couldn't"  . CAROTID ENDARTERECTOMY Right 08/26/2015  . CORONARY ANGIOPLASTY    . CORONARY ARTERY BYPASS GRAFT  Feb. 2005   4 vessel  . ENDARTERECTOMY Right 08/26/2015   Procedure: Right Carotid ENDARTERECTOMY with Patch Angioplasty ;  Surgeon: [name] Posner, MD;  Location: [facility];  Service: Vascular;  Laterality: Right;  . KELOID EXCISION  04/2008   on chest scar; [name]. Dessie [name]  . KELOID EXCISION    . PILONIDAL CYST EXCISION  1989     Current Outpatient Medications  Medication Sig Dispense Refill  . atorvastatin (LIPITOR) 20 MG tablet TAKE 1/2 TABLET BY MOUTH DAILY 15 tablet 11  . Cholecalciferol (VITAMIN D) 2000 UNITS CAPS Take 1 capsule by mouth daily.     . clopidogrel (PLAVIX) 75 MG tablet TAKE 1 TABLET BY MOUTH EVERY DAY 90 tablet 3  . hydrochlorothiazide (MICROZIDE) 12.5 MG capsule TAKE ONE CAPSULE BY MOUTH EVERY DAY 90 capsule 0  . losartan (COZAAR) 25 MG tablet TAKE 2 TABLETS BY MOUTH EVERY DAY 180 tablet 1  . nitroGLYCERIN (NITROSTAT) 0.4 MG SL tablet Place 1 tablet (0.4 mg total) under the tongue every 5 (five) minutes as needed for chest pain. (Patient not taking: Reported on 08/13/2019) 25 tablet 3  . vitamin B-12 (  CYANOCOBALAMIN) 1000 MCG tablet Take 1,000 mcg by mouth daily.     No current facility-administered medications for this visit.     Allergies:   Niacin    Social History:  The patient  reports that he has quit smoking. He has quit using smokeless tobacco.  His smokeless tobacco use included snuff. He reports that he does not drink alcohol or use drugs.   Family History:  The patient's family history includes Arthritis in his brother; Diabetes in his brother, mother, and sister; Fibromyalgia in his sister; Heart attack (age of onset: 48) in his mother; Heart disease in his brother, brother, and mother; Kidney disease in his mother; Lung cancer in his paternal uncle; Lung cancer (age of onset: 18) in his  father.    ROS:  General:no colds or fevers, no weight changes Skin:no rashes or ulcers HEENT:no blurred vision, no congestion CV:see HPI PUL:see HPI GI:no diarrhea constipation or melena, no indigestion GU:no hematuria, no dysuria MS:no joint pain, no claudication Neuro:no syncope, no lightheadedness Endo:no diabetes, no thyroid disease  Wt Readings from Last 3 Encounters:  10/16/19 231 lb (104.8 kg)  08/13/19 227 lb (103 kg)  06/09/19 225 lb 12 oz (102.4 kg)     PHYSICAL EXAM: VS:  BP (!) 154/64   Pulse 70   Ht 6\' 2"  (1.88 m)   Wt 231 lb (104.8 kg)   SpO2 99%   BMI 29.66 kg/m  , BMI Body mass index is 29.66 kg/m. Affect appropriate Healthy:  appears stated age 43: normal Neck supple with no adenopathy Post right CEA  JVP normal no bruits no thyromegaly Lungs clear with no wheezing and good diaphragmatic motion Heart:  S1/S2 no murmur, no rub, gallop or click PMI normal Abdomen: benighn, BS positve, no tenderness, no AAA no bruit.  No HSM or HJR Distal pulses intact with no bruits Plus one bilateral edema with varicosities  Neuro non-focal Skin warm and dry No muscular weakness    EKG:   10/16/19 SR rate 70 nonspecific ST changes   Recent Labs: 06/03/2019: ALT 18 08/13/2019: BUN 16; Creatinine, Ser 1.12; Potassium 3.7; Pro B Natriuretic peptide (BNP) 49.0; Sodium 137    Lipid Panel    Component Value Date/Time   CHOL 111 06/03/2019 0815   TRIG 122.0 06/03/2019 0815   HDL 31.60 (L) 06/03/2019 0815   CHOLHDL 4 06/03/2019 0815   VLDL 24.4 06/03/2019 0815   LDLCALC 55 06/03/2019 0815       Other studies Reviewed: Additional studies/ records that were reviewed today include: previous notes, nuc study.and carotid    ASSESSMENT AND PLAN:  1.  CAD s/p CABG 2005 Had no symptoms before CABG  Non ischemic myovue 09/09/18 continue medical Rx  2. Carotid:  Post right CEA with plaque and no stenosis on left by duplex 09/2017 /u [name] Early History TIA on ASA  [place] ordered   3.  HTN  Controlled valsartan stopped due to recall   4. Hyperlipidemia stable on statin. Last LDL 61 on labs 08/13/18 normal LFTls   5. Neuro:  Gait issues ? Neuropathy from low B12 getting shots f/u neurology [name] [name] On plavix for previous TIA and CABG   6. DM:  Discussed low carb diet.  Target hemoglobin A1c is 6.5 or less.  Continue current medications.      [name]

## 2019-10-13 NOTE — Telephone Encounter (Signed)
Order entered for B12 lab. Patient called and will come in and get drawn this week prior to next week appt.

## 2019-10-14 ENCOUNTER — Other Ambulatory Visit (INDEPENDENT_AMBULATORY_CARE_PROVIDER_SITE_OTHER): Payer: Medicare Other

## 2019-10-14 ENCOUNTER — Other Ambulatory Visit: Payer: Self-pay

## 2019-10-14 DIAGNOSIS — E538 Deficiency of other specified B group vitamins: Secondary | ICD-10-CM | POA: Diagnosis not present

## 2019-10-15 LAB — VITAMIN B12: Vitamin B-12: 979 pg/mL — ABNORMAL HIGH (ref 211–911)

## 2019-10-16 ENCOUNTER — Other Ambulatory Visit: Payer: Self-pay

## 2019-10-16 ENCOUNTER — Ambulatory Visit (INDEPENDENT_AMBULATORY_CARE_PROVIDER_SITE_OTHER): Payer: Medicare Other | Admitting: Cardiovascular Disease

## 2019-10-16 ENCOUNTER — Encounter: Payer: Self-pay | Admitting: Cardiovascular Disease

## 2019-10-16 VITALS — BP 154/64 | HR 70 | Ht 74.0 in | Wt 231.0 lb

## 2019-10-16 DIAGNOSIS — I25810 Atherosclerosis of coronary artery bypass graft(s) without angina pectoris: Secondary | ICD-10-CM | POA: Diagnosis not present

## 2019-10-16 DIAGNOSIS — I6521 Occlusion and stenosis of right carotid artery: Secondary | ICD-10-CM | POA: Diagnosis not present

## 2019-10-16 NOTE — Patient Instructions (Addendum)
Medication Instructions:   *If you need a refill on your cardiac medications before your next appointment, please call your pharmacy*  Lab Work:  If you have labs (blood work) drawn today and your tests are completely normal, you will receive your results only by: . MyChart Message (if you have MyChart) OR . A paper copy in the mail If you have any lab test that is abnormal or we need to change your treatment, we will call you to review the results.  Testing/Procedures: Your physician has requested that you have a carotid duplex. This test is an ultrasound of the carotid arteries in your neck. It looks at blood flow through these arteries that supply the brain with blood. Allow one hour for this exam. There are no restrictions or special instructions.  Follow-Up: At [facility], you and your health needs are our priority.  As part of our continuing mission to provide you with exceptional heart care, we have created designated Provider Care Teams.  These Care Teams include your primary Cardiologist (physician) and Advanced Practice Providers (APPs -  Physician Assistants and Nurse Practitioners) who all work together to provide you with the care you need, when you need it.  Your next appointment:   6 month(s)  The format for your next appointment:   In Person  Provider:   You may see Peter Nishan, MD or one of the following Advanced Practice Providers on your designated Care Team:    Lori Gerhardt, NP  Laura Ingold, NP  Jill McDaniel, NP 

## 2019-10-19 NOTE — Progress Notes (Signed)
NEUROLOGY FOLLOW UP OFFICE NOTE  GUILHERME CHAPPEL [id number]  HISTORY OF PRESENT ILLNESS: [name] [name] is a 78 year old left-handed man with hypertension, coronary artery disease, carotid artery disease status post CEA, cerebrovascular disease status post TIA, peripheral venous insufficiency, impaired fasting glucose who follows up for unsteady gait and B12 deficiency.  UPDATE: Taking B12 1000 mcg daily. Labs from last week include Hgb A1c 6.9 and B12 979.  He recently quit smokeless tobacco.  He has increased exercise, walks at least a mile a day.  He has been feeling much better since he did that.  He feels more steady while walking.  He has a walking stick but often doesn't need it.  HISTORY: Since 2018, he reports problems at balance. Occasionally, if he is walking and makes a quick turn, he briefly loses his balance. Sometimes it occurs when he stands from a sitting position. His legs feel tired. He walks slower. When he walks, he has trouble picking up his legs. His legs are slightly wide based. He walks leaned forward. He denies back or radicular pain. He denies numbness. His legs feel "tired" but not weak. When he is out shopping, he uses the cart for support. Once in awhile he feels briefly dizzy, particularly when he gets up quickly. He denies urinary incontinence.  TSH from 08/25/18 was 2.24. B12 was 142. He was started on B12 injections. 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 MRI of lumbar spine from 09/23/18 was personally reviewed and showed L3-4 central disc protrusion that could possibly irritate either L4 nerve root but not likely cause of gait issues. MRI of brain without contrast from 10/07/18 showed mild cerebral atrophy and moderate chronic small vessel ischemic changes but no ventriculomegaly consistent with NPH.   His brother has peripheral vascular disease and AAA.   PAST MEDICAL HISTORY: Past Medical History:  Diagnosis Date  . Carotid artery occlusion   . Cerebrovascular disease   . Coronary artery disease    s/p CABG 2005; sees Dr Johnsie Cancel yearly  . Gynecomastia   . 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 Hypercholesterolemia   . Hypertension   . Keloid   . Neurodermatitis   . Overweight(278.02)   . Personal history of colonic polyps 10/08/2006   tubular adenomas  . Renal insufficiency   . Shoulder pain   . Transient ischemic attack 2006   "lasted ~ 5 seconds"  . Vitamin D deficiency     MEDICATIONS: Current Outpatient Medications on File Prior to Visit  Medication Sig Dispense Refill  . atorvastatin (LIPITOR) 20 MG tablet TAKE 1/2 TABLET BY MOUTH DAILY 15 tablet 11  . Cholecalciferol (VITAMIN D) 2000 UNITS CAPS Take 1 capsule by mouth daily.     . clopidogrel (PLAVIX) 75 MG tablet TAKE 1 TABLET BY MOUTH EVERY DAY 90 tablet 3  . hydrochlorothiazide (MICROZIDE) 12.5 MG capsule TAKE ONE CAPSULE BY MOUTH EVERY DAY 90 capsule 0  . losartan (COZAAR) 25 MG tablet TAKE 2 TABLETS BY MOUTH EVERY DAY 180 tablet 1  . nitroGLYCERIN (NITROSTAT) 0.4 MG SL tablet Place 1 tablet (0.4 mg total) under the tongue every 5 (five) minutes as needed for chest pain. (Patient not taking: Reported on 08/13/2019) 25 tablet 3  . vitamin B-12 (CYANOCOBALAMIN) 1000 MCG tablet Take 1,000 mcg by mouth daily.     No current facility-administered medications on file prior to visit.     ALLERGIES: Allergies  Allergen Reactions  .  Niacin Other (See Comments)    REACTION: intol to NIACIN w/ headaches REACTION: intol to NIACIN w/ headaches    FAMILY HISTORY: Family History  Problem Relation Age of Onset  . Heart disease Mother        Before age 39  . Diabetes Mother   . Kidney disease Mother   . Heart attack Mother 29  . Lung cancer Father 40  . Diabetes Brother   . Heart disease Brother   . Heart disease Brother   . Arthritis Brother   . Diabetes Sister   . Fibromyalgia  Sister   . Lung cancer Paternal Uncle        questionable as to if it was lung ca  . Colon cancer Neg Hx     SOCIAL HISTORY: Social History   Socioeconomic History  . Marital status: Married    Spouse name: [name]  . Number of children: 1  . Years of education: Not on file  . Highest education level: Some college, no degree  Occupational History  . Occupation: retired  Scientific laboratory technician  . Financial resource strain: Not on file  . Food insecurity    Worry: Not on file    Inability: Not on file  . Transportation needs    Medical: Not on file    Non-medical: Not on file  Tobacco Use  . Smoking status: Former Research scientist (life sciences)  . Smokeless tobacco: Former Systems developer    Types: Snuff  . Tobacco comment: uses dip 3-4 times per week  Substance and Sexual Activity  . Alcohol use: No    Alcohol/week: 0.0 standard drinks  . Drug use: No  . Sexual activity: Yes  Lifestyle  . Physical activity    Days per week: Not on file    Minutes per session: Not on file  . Stress: Not on file  Relationships  . Social Herbalist on phone: Not on file    Gets together: Not on file    Attends religious service: Not on file    Active member of club or organization: Not on file    Attends meetings of clubs or organizations: Not on file    Relationship status: Not on file  . Intimate partner violence    Fear of current or ex partner: Not on file    Emotionally abused: Not on file    Physically abused: Not on file    Forced sexual activity: Not on file  Other Topics Concern  . Not on file  Social History Narrative   Retired.   Once worked for the [employer].   Married.   Enjoys reading, spending time with family.     REVIEW OF SYSTEMS: Constitutional: No fevers, chills, or sweats, no generalized fatigue, change in appetite Eyes: No visual changes, double vision, eye pain Ear, nose and throat: No hearing loss, ear pain, nasal congestion, sore throat Cardiovascular: No chest pain, palpitations  Respiratory:  No shortness of breath at rest or with exertion, wheezes GastrointestinaI: No nausea, vomiting, diarrhea, abdominal pain, fecal incontinence Genitourinary:  No dysuria, urinary retention or frequency Musculoskeletal:  No neck pain, back pain Integumentary: No rash, pruritus, skin lesions Neurological: as above Psychiatric: No depression, insomnia, anxiety Endocrine: No palpitations, fatigue, diaphoresis, mood swings, change in appetite, change in weight, increased thirst Hematologic/Lymphatic:  No purpura, petechiae. Allergic/Immunologic: no itchy/runny eyes, nasal congestion, recent allergic reactions, rashes  PHYSICAL EXAM: Blood pressure (!) 156/71, pulse 75, height 6' (1.829 m), weight 222 lb 9.6  oz (101 kg), SpO2 100 %. General: No acute distress.  Patient appears well-groomed.   Head:  Normocephalic/atraumatic Eyes:  Fundi examined but not visualized Neck: supple, no paraspinal tenderness, full range of motion Heart:  Regular rate and rhythm Lungs:  Clear to auscultation bilaterally Back: No paraspinal tenderness Neurological Exam: alert and oriented to person, place, and time. Attention span and concentration intact, recent and remote memory intact, fund of knowledge intact.  Speech fluent and not dysarthric, language intact.  CN II-XII intact. Bulk and tone normal, muscle strength 5/5 throughout.  Sensation to light touch intact.  Deep tendon reflexes 1+ throughout, toes downgoing.  Finger to nose testing intact.  Gait slightly wobbles but overall steady, Romberg negative.  IMPRESSION: 1.  Unsteady gait.  Possibly secondary to B12 deficiency. Still a little unsteady but improved.  No evidence of NPH, peripheral neuropathy or lumbar stenosis. He does not exhibit upper motor neuron signs to suggest cervical myelopathy such as due to cervical spinal stenosis. 2.  Hypertension  PLAN: 1.  Continue exercise/walking 2.  Continue B12.  Level may be followed by PCP to  determine if it should be discontinued at some point. 3.  Follow up with PCP regarding blood pressure 4.  Follow up as needed.  15 minutes spent face to face with patient, over 50% spent discussing management.   Metta Clines, DO  CC: Alma Friendly, NP

## 2019-10-20 ENCOUNTER — Ambulatory Visit (INDEPENDENT_AMBULATORY_CARE_PROVIDER_SITE_OTHER): Payer: Medicare Other | Admitting: Neurology

## 2019-10-20 ENCOUNTER — Encounter: Payer: Self-pay | Admitting: Neurology

## 2019-10-20 ENCOUNTER — Other Ambulatory Visit: Payer: Self-pay

## 2019-10-20 VITALS — BP 156/71 | HR 75 | Ht 72.0 in | Wt 222.6 lb

## 2019-10-20 DIAGNOSIS — I10 Essential (primary) hypertension: Secondary | ICD-10-CM

## 2019-10-20 DIAGNOSIS — R2681 Unsteadiness on feet: Secondary | ICD-10-CM

## 2019-10-20 DIAGNOSIS — E538 Deficiency of other specified B group vitamins: Secondary | ICD-10-CM | POA: Diagnosis not present

## 2019-10-20 NOTE — Patient Instructions (Signed)
Continue exercising/walking Continue B12.  Your PCP can monitor the level and determine if it should be discontinued Follow up with me as needed.

## 2019-10-22 ENCOUNTER — Other Ambulatory Visit: Payer: Self-pay

## 2019-10-22 ENCOUNTER — Ambulatory Visit (HOSPITAL_COMMUNITY)
Admission: RE | Admit: 2019-10-22 | Discharge: 2019-10-22 | Disposition: A | Discharge status: Home / Self Care | Payer: Medicare Other | Source: Ambulatory Visit | Attending: Cardiology | Admitting: Cardiology

## 2019-10-22 DIAGNOSIS — I6521 Occlusion and stenosis of right carotid artery: Secondary | ICD-10-CM

## 2019-10-26 LAB — HM DIABETES EYE EXAM

## 2019-10-28 ENCOUNTER — Encounter: Payer: Self-pay | Admitting: Primary Care

## 2019-11-09 ENCOUNTER — Other Ambulatory Visit: Payer: Self-pay | Admitting: Cardiovascular Disease

## 2019-11-09 ENCOUNTER — Other Ambulatory Visit: Payer: Self-pay | Admitting: Dermatology

## 2019-11-09 DIAGNOSIS — C4491 Basal cell carcinoma of skin, unspecified: Secondary | ICD-10-CM

## 2019-11-09 HISTORY — DX: Basal cell carcinoma of skin, unspecified: C44.91

## 2019-12-17 ENCOUNTER — Other Ambulatory Visit: Payer: Self-pay | Admitting: Primary Care

## 2019-12-17 DIAGNOSIS — I779 Disorder of arteries and arterioles, unspecified: Secondary | ICD-10-CM

## 2019-12-17 DIAGNOSIS — I2510 Atherosclerotic heart disease of native coronary artery without angina pectoris: Secondary | ICD-10-CM

## 2019-12-18 NOTE — Telephone Encounter (Signed)
Refill sent to pharmacy.   

## 2019-12-18 NOTE — Telephone Encounter (Signed)
Have not prescribe. Last appointment on 08/13/2019. No future appointment

## 2020-01-20 ENCOUNTER — Other Ambulatory Visit: Payer: Self-pay | Admitting: Cardiovascular Disease

## 2020-02-14 ENCOUNTER — Ambulatory Visit: Payer: Medicare PPO | Attending: Internal Medicine

## 2020-02-14 DIAGNOSIS — Z23 Encounter for immunization: Secondary | ICD-10-CM | POA: Insufficient documentation

## 2020-02-14 NOTE — Progress Notes (Signed)
   Covid-19 Vaccination Clinic  Name:  [name]    MRN: [id number] DOB: November 14, 1941  02/14/2020  Mr. [name] was observed post Covid-19 immunization for 15 minutes without incidence. He was provided with Vaccine Information Sheet and instruction to access the V-Safe system.   Mr. [name] was instructed to call 911 with any severe reactions post vaccine: [name] Difficulty breathing  . Swelling of your face and throat  . A fast heartbeat  . A bad rash all over your body  . Dizziness and weakness    Immunizations Administered    Name Date Dose VIS Date Route   Pfizer COVID-19 Vaccine 02/14/2020  1:46 PM 0.3 mL 12/11/2019 Intramuscular   Manufacturer: [place]   Lot: [id number]   [place]: [id number]

## 2020-03-02 ENCOUNTER — Encounter: Payer: Self-pay | Admitting: *Deleted

## 2020-03-08 ENCOUNTER — Ambulatory Visit: Payer: Medicare PPO | Attending: Internal Medicine

## 2020-03-08 DIAGNOSIS — Z23 Encounter for immunization: Secondary | ICD-10-CM

## 2020-03-08 NOTE — Progress Notes (Signed)
   Covid-19 Vaccination Clinic  Name:  [name] [name]    MRN: [id number] DOB: December 30, 1941  03/08/2020  [name] was observed post Covid-19 immunization for 15 minutes without incident. He was provided with Vaccine Information Sheet and instruction to access the V-Safe system.   [name] was instructed to call 911 with any severe reactions post vaccine: [name] Difficulty breathing  . Swelling of face and throat  . A fast heartbeat  . A bad rash all over body  . Dizziness and weakness   Immunizations Administered    Name Date Dose VIS Date Route   Pfizer COVID-19 Vaccine 03/08/2020  9:48 AM 0.3 mL 12/11/2019 Intramuscular   Manufacturer: [place]   Lot: [id number]   [place]: [id number]

## 2020-03-09 ENCOUNTER — Ambulatory Visit: Payer: Medicare PPO

## 2020-03-21 ENCOUNTER — Encounter: Payer: Self-pay | Admitting: Dermatology

## 2020-03-21 ENCOUNTER — Other Ambulatory Visit: Payer: Self-pay

## 2020-03-21 ENCOUNTER — Ambulatory Visit (INDEPENDENT_AMBULATORY_CARE_PROVIDER_SITE_OTHER): Payer: Medicare PPO | Admitting: Dermatology

## 2020-03-21 DIAGNOSIS — D492 Neoplasm of unspecified behavior of bone, soft tissue, and skin: Secondary | ICD-10-CM

## 2020-03-21 DIAGNOSIS — C4441 Basal cell carcinoma of skin of scalp and neck: Secondary | ICD-10-CM

## 2020-03-21 NOTE — Progress Notes (Addendum)
   Follow-Up Visit   Subjective  [name] [name] is a 79 y.o. male who presents for the following: Basal Cell Carcinoma (Here for treatment of BCC ).  BCC Location: Right neck Duration:  Quality:  Associated Signs/Symptoms: Modifying Factors:  Severity:  Timing: Context: For treatment  The following portions of the chart were reviewed this encounter and updated as appropriate:     Objective  Well appearing patient in no apparent distress; mood and affect are within normal limits.  A focused examination was done including head and neck.  Objective  behind Right ear: Biopsy site Id'd by MA and me. Basal cell skin cancer below the lower right ear.  Biopsy site ID'd by medical assistant and me.  Triple curettage showed significant depth so the base was electrocauterized and recuretted.  Narrow margin excision and layered closure.  Follow-up to remove outer stitches next week.  [name] [name] knows that he could call me anytime if there is any issue before then.  He was cautioned to be careful with a razor blade. 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  Skin excision  Lesion length (cm):  2.2 Lesion width (cm):  1 Margin per side (cm):  0.1 Total excision diameter (cm):  2.4 Informed consent: discussed and consent obtained   Timeout: patient name, date of birth, surgical site, and procedure verified   Procedure prep:  Patient was prepped and draped in usual sterile fashion Prep type:  Chlorhexidine Anesthesia: the lesion was anesthetized in a standard fashion   Anesthetic:  1% lidocaine w/ epinephrine 1-100,000 local infiltration Instrument used: #15 blade   Hemostasis achieved with: suture   Outcome: patient tolerated procedure well with no complications   Post-procedure details: sterile dressing applied and wound care instructions given   Dressing type: petrolatum   Additional details:  Initial curettage x3: deep extension, cauterized,  recuretted, narrow margin ellipse, layered close  Destruction of lesion  Additional details:  Vicryl 4-0 x 1 Ethilon 4-0 x 4  Specimen 1 - Surgical pathology Differential Diagnosis: bcc Check Margins: No   Inferior Margin stained  Neoplasm of skin  Other Related Procedures Skin / nail biopsy Type of biopsy: punch

## 2020-03-21 NOTE — Patient Instructions (Addendum)

## 2020-03-28 ENCOUNTER — Other Ambulatory Visit: Payer: Self-pay

## 2020-03-28 ENCOUNTER — Ambulatory Visit (INDEPENDENT_AMBULATORY_CARE_PROVIDER_SITE_OTHER): Payer: Medicare PPO

## 2020-03-28 DIAGNOSIS — Z4802 Encounter for removal of sutures: Secondary | ICD-10-CM

## 2020-03-28 DIAGNOSIS — C4441 Basal cell carcinoma of skin of scalp and neck: Secondary | ICD-10-CM

## 2020-03-28 NOTE — Patient Instructions (Addendum)
After Suture Removal  1. After sutures are removed, the wound should be coated with an antibiotic ointment (eg. Polysporin, Bacitracin) and, if possible, kept covered with a Band-Aid or bandage for an additional 24 hours.  After that, no additional wound care is generally needed.  2. It is alright to get the area wet. 3. If a skin cancer was removed, your skin should be re-examined in approximately 6 months.

## 2020-03-28 NOTE — Progress Notes (Signed)
NTS Suture removal. No s/s of infection, path report to patient.  Dr. Denna Haggard discussed with patient the advantages of having the [facility] procedure done.  Patient chose to have the place rechecked in 6 months by Dr. Denna Haggard.

## 2020-04-14 NOTE — Progress Notes (Signed)
Patient ID: [name] [name], male   DOB: [birth year], 79 y.o.   MRN: [id number]     Cardiology Office Note   Date:  04/22/2020   ID:  Maxi, V[place] 03-Jun-1941, MRN [id number]  PCP:  Pleas Koch, NP  Cardiologist:  Dr. Johnsie Cancel    No chief complaint on file.     History of Present Illness: [name] [name] is a 79 y.o. male  known coronary disease with previous coronary bypass surgery in 2005. he has normal LV function.  Last myovue 09/09/18 no ischemia EF 64% low risk August 2016 Had right CEA with Dr Early  Duplex 10/2019 plaque no stenosis   On statin with LDL at goal  No angina palpitations dyspnea or syncope  More broad based gait seen by Tomi Likens neuro Doubts Parkinson's getting B12 shots   He and wife have been vaccinated  Discussed taking ASA and stopping plavix This was initially given by neurology for ? TIA  Past Medical History:  Diagnosis Date  . Basal cell carcinoma 11/09/2019   nod & infil-behind right ear-cx3 &exc  . Basal cell carcinoma 03/21/2020   Residual BCC with peripheral margin involved - ST recommends MOHs  . Carotid artery occlusion   . Cerebrovascular disease   . Coronary artery disease    s/p CABG 2005; sees Dr Johnsie Cancel yearly  . Gynecomastia   . 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 Hypercholesterolemia   . Hypertension   . Keloid   . Neurodermatitis   . Overweight(278.02)   . Personal history of colonic polyps 10/08/2006   tubular adenomas  . Renal insufficiency   . Shoulder pain   . Transient ischemic attack 2006   "lasted ~ 5 seconds"  . Vitamin D deficiency     Past Surgical History:  Procedure Laterality Date  . CARDIAC CATHETERIZATION  02/2004   "tried to stent; couldn't"  . CAROTID ENDARTERECTOMY Right 08/26/2015  . CORONARY ANGIOPLASTY    . CORONARY ARTERY BYPASS GRAFT  Feb. 2005   4 vessel  . ENDARTERECTOMY Right 08/26/2015   Procedure: Right Carotid ENDARTERECTOMY with Patch Angioplasty ;  Surgeon: Rosetta Posner, MD;  Location: [facility];  Service: Vascular;  Laterality: Right;  . 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 Dessie Coma  . KELOID EXCISION    . PILONIDAL CYST EXCISION  1989     Current Outpatient Medications  Medication Sig Dispense Refill  . atorvastatin (LIPITOR) 20 MG tablet TAKE 1/2 TABLET BY MOUTH DAILY 15 tablet 11  . Cholecalciferol (VITAMIN D) 2000 UNITS CAPS Take 1 capsule by mouth daily.     . clopidogrel (PLAVIX) 75 MG tablet TAKE 1 TABLET BY MOUTH EVERY DAY 90 tablet 1  . hydrochlorothiazide (MICROZIDE) 12.5 MG capsule TAKE 1 CAPSULE BY MOUTH EVERY DAY 90 capsule 3  . losartan (COZAAR) 25 MG tablet TAKE 2 TABLETS BY MOUTH EVERY DAY 180 tablet 2  . nitroGLYCERIN (NITROSTAT) 0.4 MG SL tablet Place 1 tablet (0.4 mg total) under the tongue every 5 (five) minutes as needed for chest pain. 25 tablet 3  . vitamin B-12 (CYANOCOBALAMIN) 1000 MCG tablet Take 1,000 mcg by mouth daily.     No current facility-administered medications for this visit.    Allergies:   Niacin    Social History:  The patient  reports that he has quit smoking. He has quit using smokeless tobacco.  His smokeless tobacco use included snuff. He reports that he does not drink  alcohol or use drugs.   Family History:  The patient's family history includes Arthritis in his brother; Diabetes in his brother, mother, and sister; Fibromyalgia in his sister; Healthy in his daughter; Heart attack (age of onset: 24) in his mother; Heart disease in his brother, brother, and mother; Kidney disease in his mother; Lung cancer in his paternal uncle; Lung cancer (age of onset: 40) in his father.    ROS:  General:no colds or fevers, no weight changes Skin:no rashes or ulcers HEENT:no blurred vision, no congestion CV:see HPI PUL:see HPI GI:no diarrhea constipation or melena, no indigestion GU:no hematuria, no dysuria MS:no joint pain, no claudication Neuro:no syncope, no lightheadedness Endo:no diabetes, no thyroid disease  Wt Readings  from Last 3 Encounters:  04/22/20 233 lb 1.9 oz (105.7 kg)  10/20/19 222 lb 9.6 oz (101 kg)  10/16/19 231 lb (104.8 kg)     PHYSICAL EXAM: VS:  BP 136/70   Pulse 88   Ht 6' (1.829 m)   Wt 233 lb 1.9 oz (105.7 kg)   SpO2 97%   BMI 31.62 kg/m  , BMI Body mass index is 31.62 kg/m. Affect appropriate Healthy:  appears stated age 54: normal Neck supple with no adenopathy Post right CEA  JVP normal no bruits no thyromegaly Lungs clear with no wheezing and good diaphragmatic motion Heart:  S1/S2 no murmur, no rub, gallop or click PMI normal Abdomen: benighn, BS positve, no tenderness, no AAA no bruit.  No HSM or HJR Distal pulses intact with no bruits Plus one bilateral edema with varicosities  Neuro non-focal Skin warm and dry No muscular weakness    EKG:   10/16/19 SR rate 70 nonspecific ST changes   Recent Labs: 06/03/2019: ALT 18 08/13/2019: BUN 16; Creatinine, Ser 1.12; Potassium 3.7; Pro B Natriuretic peptide (BNP) 49.0; Sodium 137    Lipid Panel    Component Value Date/Time   CHOL 111 06/03/2019 0815   TRIG 122.0 06/03/2019 0815   HDL 31.60 (L) 06/03/2019 0815   CHOLHDL 4 06/03/2019 0815   VLDL 24.4 06/03/2019 0815   LDLCALC 55 06/03/2019 0815       Other studies Reviewed: Additional studies/ records that were reviewed today include: previous notes, nuc study.and carotid    ASSESSMENT AND PLAN:  1.  CAD s/p CABG 2005 Had no symptoms before CABG  Non ischemic myovue 09/09/18 continue medical Rx D/c plavix and start ASA 81 mg daily   2. Carotid:  Post right CEA with plaque and no stenosis on left by duplex 10/22/19  f/u Dr Early History TIA on no need for plavix    3.  HTN  Controlled valsartan stopped due to recall   4. Hyperlipidemia stable on statin. Last LDL 55 on labs 06/03/19 normal LFTls   5. Neuro:  Gait issues ? Neuropathy from low B12 getting shots f/u neurology Jaffe On plavix for previous TIA and CABG   6. DM:  Discussed low carb diet.   Target hemoglobin A1c is 6.5 or less.  Continue current medications. He is getting Lab work in June with primary    F/U in a year    [name]

## 2020-04-22 ENCOUNTER — Encounter: Payer: Self-pay | Admitting: Cardiovascular Disease

## 2020-04-22 ENCOUNTER — Other Ambulatory Visit: Payer: Self-pay

## 2020-04-22 ENCOUNTER — Ambulatory Visit: Payer: Medicare PPO | Admitting: Cardiovascular Disease

## 2020-04-22 VITALS — BP 136/70 | HR 88 | Ht 72.0 in | Wt 233.1 lb

## 2020-04-22 DIAGNOSIS — Z951 Presence of aortocoronary bypass graft: Secondary | ICD-10-CM | POA: Diagnosis not present

## 2020-04-22 DIAGNOSIS — I779 Disorder of arteries and arterioles, unspecified: Secondary | ICD-10-CM

## 2020-04-22 DIAGNOSIS — Z9889 Other specified postprocedural states: Secondary | ICD-10-CM | POA: Diagnosis not present

## 2020-04-22 MED ORDER — ASPIRIN EC 81 MG PO TBEC: 81.0000 mg | DELAYED_RELEASE_TABLET | Freq: Every day | ORAL | Status: DC

## 2020-04-22 NOTE — Patient Instructions (Addendum)
Medication Instructions:  Your physician has recommended you make the following change in your medication:  1-STOP Plavix 2-START Aspirin 81 mg by mouth daily  *If you need a refill on your cardiac medications before your next appointment, please call your pharmacy*  Lab Work: If you have labs (blood work) drawn today and your tests are completely normal, you will receive your results only by: [name] MyChart Message (if you have MyChart) OR . A paper copy in the mail If you have any lab test that is abnormal or we need to change your treatment, we will call you to review the results.  Testing/Procedures: Your physician has requested that you have a carotid duplex in October. This test is an ultrasound of the carotid arteries in your neck. It looks at blood flow through these arteries that supply the brain with blood. Allow one hour for this exam. There are no restrictions or special instructions.  Follow-Up: At [facility], you and your health needs are our priority.  As part of our continuing mission to provide you with exceptional heart care, we have created designated Provider Care Teams.  These Care Teams include your primary Cardiologist (physician) and Advanced Practice Providers (APPs -  Physician Assistants and Nurse Practitioners) who all work together to provide you with the care you need, when you need it.  We recommend signing up for the patient portal called "MyChart".  Sign up information is provided on this After Visit Summary.  MyChart is used to connect with patients for Virtual Visits (Telemedicine).  Patients are able to view lab/test results, encounter notes, upcoming appointments, etc.  Non-urgent messages can be sent to your provider as well.   To learn more about what you can do with MyChart, go to [email or web address].    Your next appointment:   12 month(s)  The format for your next appointment:   In Person  Provider:   You may see [name], MD or one of the  following Advanced Practice Providers on your designated Care Team:    Truitt Merle, NP  Cecilie Kicks, NP  Kathyrn Drown, NP

## 2020-04-29 ENCOUNTER — Telehealth: Payer: Self-pay | Admitting: Primary Care

## 2020-04-29 DIAGNOSIS — I2510 Atherosclerotic heart disease of native coronary artery without angina pectoris: Secondary | ICD-10-CM

## 2020-04-29 DIAGNOSIS — I10 Essential (primary) hypertension: Secondary | ICD-10-CM

## 2020-04-29 DIAGNOSIS — E782 Mixed hyperlipidemia: Secondary | ICD-10-CM

## 2020-04-29 DIAGNOSIS — E119 Type 2 diabetes mellitus without complications: Secondary | ICD-10-CM

## 2020-04-29 DIAGNOSIS — E538 Deficiency of other specified B group vitamins: Secondary | ICD-10-CM

## 2020-04-29 DIAGNOSIS — Z125 Encounter for screening for malignant neoplasm of prostate: Secondary | ICD-10-CM

## 2020-04-29 NOTE — Telephone Encounter (Signed)
Patient called today requesting lab orders He stated that he would like to have his A1C/Blood sugar checked. Patient stated he goes to [facility] to have his lab drawn and would like a call when this has been ordered.

## 2020-04-29 NOTE — Telephone Encounter (Signed)
Please kindly notify patient that he is over due for follow-up of diabetes with me in the office.  I sent him a my chart message in October 2020 and never heard back from him.  Given the diabetes diagnosis I would like to see him in our office.   I do see that he will be due for a physical in June 2021, we can meet at that time if he schedules an appointment when you speak with him.  In the meantime I can order the A1c test that he is requesting, but if it is uncontrolled then we may need to see him in our office.  Once he schedules his physical I will place the lab order.  Let me know.

## 2020-05-05 ENCOUNTER — Telehealth: Payer: Self-pay | Admitting: Primary Care

## 2020-05-05 NOTE — Telephone Encounter (Signed)
Spoken and notified patient's wife of Kate Clark's comments. Patient's wife verbalized understanding.  

## 2020-05-05 NOTE — Addendum Note (Signed)
Addended by: Pleas Koch on: 05/05/2020 12:23 PM   Modules accepted: Orders

## 2020-05-05 NOTE — Telephone Encounter (Signed)
Opened in error

## 2020-05-05 NOTE — Telephone Encounter (Signed)
Lvm asking pt to call office 

## 2020-05-05 NOTE — Telephone Encounter (Signed)
Patient called and scheduled cpx on 06/10/20.  Patient doesn't want to do a phone call with the medicare wellness nurse.  Patient would like to have the labs for his physical done at the [place] office.  Patient's requesting CRP,PSA and B-12 ordered in addition to his other labs. Patient would like a call back when the orders for his A1c and the orders for his physical labs are put in Epic.

## 2020-05-05 NOTE — Telephone Encounter (Signed)
Please notify patient that labs were placed. We will see him in June as scheduled.

## 2020-05-12 NOTE — Addendum Note (Signed)
Addended by: Orland Dec on: 05/12/2020 08:22 AM   Modules accepted: Level of Service

## 2020-05-16 NOTE — Addendum Note (Signed)
Addended by: Ashok Cordia B on: 05/16/2020 03:00 PM   Modules accepted: Orders

## 2020-06-02 ENCOUNTER — Other Ambulatory Visit (INDEPENDENT_AMBULATORY_CARE_PROVIDER_SITE_OTHER): Payer: Medicare PPO

## 2020-06-02 DIAGNOSIS — Z125 Encounter for screening for malignant neoplasm of prostate: Secondary | ICD-10-CM | POA: Diagnosis not present

## 2020-06-02 DIAGNOSIS — E782 Mixed hyperlipidemia: Secondary | ICD-10-CM | POA: Diagnosis not present

## 2020-06-02 DIAGNOSIS — E538 Deficiency of other specified B group vitamins: Secondary | ICD-10-CM

## 2020-06-02 DIAGNOSIS — I10 Essential (primary) hypertension: Secondary | ICD-10-CM | POA: Diagnosis not present

## 2020-06-02 DIAGNOSIS — E119 Type 2 diabetes mellitus without complications: Secondary | ICD-10-CM

## 2020-06-02 LAB — COMPREHENSIVE METABOLIC PANEL
ALT: 25 U/L (ref 0–53)
AST: 22 U/L (ref 0–37)
Albumin: 4.2 g/dL (ref 3.5–5.2)
Alkaline Phosphatase: 74 U/L (ref 39–117)
BUN: 19 mg/dL (ref 6–23)
CO2: 32 mEq/L (ref 19–32)
Calcium: 9.1 mg/dL (ref 8.4–10.5)
Chloride: 102 mEq/L (ref 96–112)
Creatinine, Ser: 1.14 mg/dL (ref 0.40–1.50)
GFR: 62.36 mL/min (ref >60.00)
Glucose, Bld: 136 mg/dL — ABNORMAL HIGH (ref 70–99)
Potassium: 4.5 mEq/L (ref 3.5–5.1)
Sodium: 139 mEq/L (ref 135–145)
Total Bilirubin: 0.9 mg/dL (ref 0.2–1.2)
Total Protein: 6.5 g/dL (ref 6.0–8.3)

## 2020-06-02 LAB — LIPID PANEL
Cholesterol: 124 mg/dL (ref 0–200)
HDL: 32.2 mg/dL — ABNORMAL LOW (ref >39.00)
LDL Cholesterol: 57 mg/dL (ref 0–99)
NonHDL: 91.71
Total CHOL/HDL Ratio: 4
Triglycerides: 172 mg/dL — ABNORMAL HIGH (ref 0.0–149.0)
VLDL: 34.4 mg/dL (ref 0.0–40.0)

## 2020-06-02 LAB — CBC
HCT: 46.8 % (ref 39.0–52.0)
Hemoglobin: 16.1 g/dL (ref 13.0–17.0)
MCHC: 34.4 g/dL (ref 30.0–36.0)
MCV: 102.8 fl — ABNORMAL HIGH (ref 78.0–100.0)
Platelets: 184 10*3/uL (ref 150.0–400.0)
RBC: 4.55 Mil/uL (ref 4.22–5.81)
RDW: 13.7 % (ref 11.5–15.5)
WBC: 7 10*3/uL (ref 4.0–10.5)

## 2020-06-02 LAB — HEMOGLOBIN A1C: Hgb A1c MFr Bld: 7.1 % — ABNORMAL HIGH (ref 4.6–6.5)

## 2020-06-02 LAB — PSA, MEDICARE: PSA: 2.29 ng/ml (ref 0.10–4.00)

## 2020-06-02 LAB — VITAMIN B12: Vitamin B-12: 820 pg/mL (ref 211–911)

## 2020-06-02 LAB — C-REACTIVE PROTEIN: CRP: <1 mg/dL (ref 0.5–20.0)

## 2020-06-10 ENCOUNTER — Other Ambulatory Visit: Payer: Self-pay

## 2020-06-10 ENCOUNTER — Encounter: Payer: Self-pay | Admitting: Primary Care

## 2020-06-10 ENCOUNTER — Ambulatory Visit (INDEPENDENT_AMBULATORY_CARE_PROVIDER_SITE_OTHER): Payer: Medicare PPO | Admitting: Primary Care

## 2020-06-10 VITALS — BP 136/70 | HR 83 | Temp 96.6°F | Ht 72.0 in | Wt 230.8 lb

## 2020-06-10 DIAGNOSIS — E119 Type 2 diabetes mellitus without complications: Secondary | ICD-10-CM

## 2020-06-10 DIAGNOSIS — Z0000 Encounter for general adult medical examination without abnormal findings: Secondary | ICD-10-CM

## 2020-06-10 DIAGNOSIS — R600 Localized edema: Secondary | ICD-10-CM

## 2020-06-10 DIAGNOSIS — I2510 Atherosclerotic heart disease of native coronary artery without angina pectoris: Secondary | ICD-10-CM

## 2020-06-10 DIAGNOSIS — E782 Mixed hyperlipidemia: Secondary | ICD-10-CM

## 2020-06-10 DIAGNOSIS — I10 Essential (primary) hypertension: Secondary | ICD-10-CM

## 2020-06-10 DIAGNOSIS — I6521 Occlusion and stenosis of right carotid artery: Secondary | ICD-10-CM

## 2020-06-10 DIAGNOSIS — Z23 Encounter for immunization: Secondary | ICD-10-CM

## 2020-06-10 DIAGNOSIS — R2681 Unsteadiness on feet: Secondary | ICD-10-CM

## 2020-06-10 MED ORDER — BLOOD GLUCOSE MONITOR KIT: PACK | 0 refills | Status: DC

## 2020-06-10 MED ORDER — ZOSTER VAC RECOMB ADJUVANTED 50 MCG/0.5ML IM SUSR: 0.5000 mL | Freq: Once | INTRAMUSCULAR | 1 refills | Status: AC

## 2020-06-10 MED ORDER — METFORMIN HCL ER 500 MG PO TB24: 500.0000 mg | ORAL_TABLET | Freq: Every day | ORAL | 1 refills | Status: DC

## 2020-06-10 NOTE — Assessment & Plan Note (Signed)
LDL at goal on atorvastatin, continue same.  

## 2020-06-10 NOTE — Assessment & Plan Note (Signed)
Uncontrolled with A1C of 7.1 given history of CAD and CABG, A1C goal of <6.5.  Discussed the absolute need for weight loss through diet and exercise. Will Trial low dose Metformin ER 500 mg once daily. Rx for glucometer sent to pharmacy, he will start monitoring glucose.  Managed on ARB and statin.  Follow up in 3 months.

## 2020-06-10 NOTE — Assessment & Plan Note (Signed)
Improving with diabetic sock use. Discussed the need for weight loss, elevating lower extremities.

## 2020-06-10 NOTE — Assessment & Plan Note (Addendum)
Asymptomatic. Recent LDL at goal. Continue atorvastatin and aspirin.  Following with cardiology.

## 2020-06-10 NOTE — Assessment & Plan Note (Signed)
Immunizations UTD except for Shingrix. Rx provided. PSA UTD. Colonoscopy UTD, no further imaging needed due to age. Encouraged a healthy diet and regular diet.  Exam today stable. Labs reviewed.

## 2020-06-10 NOTE — Assessment & Plan Note (Signed)
Stable in the office today, CMP reviewed. Continue losartan.

## 2020-06-10 NOTE — Assessment & Plan Note (Signed)
No longer on Plavix, now on aspirin only. Continue aspiring and atorvastatin. LDL at goal. Working to get diabetes under better control. Repeat carotid ultrasound due again in October 2021.

## 2020-06-10 NOTE — Progress Notes (Signed)
Subjective:    Patient ID: [name] [name], male    DOB: 21-Oct-1941, 79 y.o.   MRN: [id number]  HPI  This visit occurred during the SARS-CoV-2 public health emergency.  Safety protocols were in place, including screening questions prior to the visit, additional usage of staff PPE, and extensive cleaning of exam room while observing appropriate contact time as indicated for disinfecting solutions.   [name] [name] is a 79 year old male who presents today for complete physical.  Immunizations: -Tetanus: Completed in 2011 -Influenza: Completed last season  -Shingles: Never completed  -Pneumonia: Completed Prevnar and Pneumovax -Covid-19: Completed series   Diet: He endorses a fair diet.  Exercise: He is not exercising.   Eye exam: Completes annually Dental exam: Completes annually   Colonoscopy: Completed in 2013, no exam since.  PSA: 2.29  BP Readings from Last 3 Encounters:  06/10/20 136/70  04/22/20 136/70  10/20/19 (!) 156/71   Wt Readings from Last 3 Encounters:  06/10/20 230 lb 12 oz (104.7 kg)  04/22/20 233 lb 1.9 oz (105.7 kg)  10/20/19 222 lb 9.6 oz (101 kg)     Review of Systems  Constitutional: Negative for unexpected weight change.  HENT: Negative for rhinorrhea.   Respiratory: Negative for cough and shortness of breath.   Cardiovascular: Negative for chest pain.  Gastrointestinal: Negative for constipation and diarrhea.  Genitourinary: Negative for difficulty urinating.  Musculoskeletal: Negative for arthralgias.  Skin: Negative for rash.  Allergic/Immunologic: Negative for environmental allergies.  Neurological: Negative for numbness and headaches.       Past Medical History:  Diagnosis Date  . Basal cell carcinoma 11/09/2019   nod & infil-behind right ear-cx3 &exc  . Basal cell carcinoma 03/21/2020   Residual BCC with peripheral margin involved - ST recommends MOHs  . Carotid artery occlusion   . Cerebrovascular disease   . Coronary artery  disease    s/p CABG 2005; sees Dr Johnsie Cancel yearly  . Gynecomastia   . 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 Hypercholesterolemia   . Hypertension   . Keloid   . Neurodermatitis   . Overweight(278.02)   . Personal history of colonic polyps 10/08/2006   tubular adenomas  . Renal insufficiency   . Shoulder pain   . Transient ischemic attack 2006   "lasted ~ 5 seconds"  . Vitamin D deficiency      Social History   Socioeconomic History  . Marital status: Married    Spouse name: [name]  . Number of children: 1  . Years of education: Not on file  . Highest education level: Some college, no degree  Occupational History  . Occupation: retired  Tobacco Use  . Smoking status: Former Research scientist (life sciences)  . Smokeless tobacco: Former Systems developer    Types: Snuff  . Tobacco comment: uses dip 3-4 times per week  Vaping Use  . Vaping Use: Never used  Substance and Sexual Activity  . Alcohol use: No    Alcohol/week: 0.0 standard drinks  . Drug use: No  . Sexual activity: Yes  Other Topics Concern  . Not on file  Social History Narrative   Retired.   Once worked for the [employer].   Married.   Enjoys reading, spending time with family.    Social Determinants of Health   Financial Resource Strain:   . Difficulty of Paying Living Expenses:   Food Insecurity:   . Worried About Charity fundraiser in the Last Year:   . [employer]  in the Last Year:   Transportation Needs:   . Film/video editor (Medical):   [name] Lack of Transportation (Non-Medical):   Physical Activity:   . Days of Exercise per Week:   . Minutes of Exercise per Session:   Stress:   . Feeling of Stress :   Social Connections:   . Frequency of Communication with Friends and Family:   . Frequency of Social Gatherings with Friends and Family:   . Attends Religious Services:   . Active Member of Clubs or Organizations:   . Attends Archivist Meetings:   [name] Marital Status:   Intimate Partner Violence:   . Fear of Current or Ex-Partner:     . Emotionally Abused:   [name] Physically Abused:   . Sexually Abused:     Past Surgical History:  Procedure Laterality Date  . CARDIAC CATHETERIZATION  02/2004   "tried to stent; couldn't"  . CAROTID ENDARTERECTOMY Right 08/26/2015  . CORONARY ANGIOPLASTY    . CORONARY ARTERY BYPASS GRAFT  Feb. 2005   4 vessel  . ENDARTERECTOMY Right 08/26/2015   Procedure: Right Carotid ENDARTERECTOMY with Patch Angioplasty ;  Surgeon: Rosetta Posner, MD;  Location: [facility];  Service: Vascular;  Laterality: Right;  . 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 Dessie Coma  . KELOID EXCISION    . PILONIDAL CYST EXCISION  1989    Family History  Problem Relation Age of Onset  . Heart disease Mother        Before age 55  . Diabetes Mother   . Kidney disease Mother   . Heart attack Mother 58  . Lung cancer Father 64  . Diabetes Brother   . Heart disease Brother   . Heart disease Brother   . Arthritis Brother   . Diabetes Sister   . Fibromyalgia Sister   . Lung cancer Paternal Uncle        questionable as to if it was lung ca  . Healthy Daughter   . Colon cancer Neg Hx     Allergies  Allergen Reactions  . Niacin Other (See Comments)    REACTION: intol to NIACIN w/ headaches REACTION: intol to NIACIN w/ headaches    Current Outpatient Medications on File Prior to Visit  Medication Sig Dispense Refill  . aspirin EC 81 MG tablet Take 1 tablet (81 mg total) by mouth daily.    [name] atorvastatin (LIPITOR) 20 MG tablet TAKE 1/2 TABLET BY MOUTH DAILY 15 tablet 11  . Cholecalciferol (VITAMIN D) 2000 UNITS CAPS Take 1 capsule by mouth daily.     . hydrochlorothiazide (MICROZIDE) 12.5 MG capsule TAKE 1 CAPSULE BY MOUTH EVERY DAY 90 capsule 3  . losartan (COZAAR) 25 MG tablet TAKE 2 TABLETS BY MOUTH EVERY DAY 180 tablet 2  . nitroGLYCERIN (NITROSTAT) 0.4 MG SL tablet Place 1 tablet (0.4 mg total) under the tongue every 5 (five) minutes as needed for chest pain. 25 tablet 3  . vitamin B-12 (CYANOCOBALAMIN)  1000 MCG tablet Take 1,000 mcg by mouth daily.     No current facility-administered medications on file prior to visit.    BP 136/70   Pulse 83   Temp (!) 96.6 F (35.9 C) (Temporal)   Ht 6' (1.829 m)   Wt 230 lb 12 oz (104.7 kg)   SpO2 98%   BMI 31.30 kg/m    Objective:   Physical Exam  Constitutional: He is oriented to person, place, and time.  HENT:  Right Ear: Tympanic membrane and ear canal normal.  Left Ear: Tympanic membrane and ear canal normal.  Eyes: Pupils are equal, round, and reactive to light.  Cardiovascular: Normal rate and regular rhythm.  Respiratory: Effort normal and breath sounds normal.  GI: Soft. Bowel sounds are normal. There is no abdominal tenderness.  Musculoskeletal:        General: Normal range of motion.     Cervical back: Neck supple.     Comments: Steady gait without assistive device.   Neurological: He is alert and oriented to person, place, and time. No cranial nerve deficit.  Reflex Scores:      Patellar reflexes are 2+ on the right side and 2+ on the left side. Skin: Skin is warm and dry.           Assessment & Plan:

## 2020-06-10 NOTE — Assessment & Plan Note (Signed)
Stable, unchanged.  Discussed to work on walking, strengthening exercises.

## 2020-06-10 NOTE — Patient Instructions (Signed)
Start metformin ER 500 mg once daily for diabetes. Take this with breakfast in the morning.  Take the shingles vaccine to your pharmacy as discussed.  Start exercising. You should be getting 150 minutes of exercise weekly.  It is important that you improve your diet. Please limit carbohydrates in the form of white bread, rice, pasta, sweets, fast food, fried food, sugary drinks, etc. Increase your consumption of fresh fruits and vegetables, whole grains, lean protein.  Ensure you are consuming 64 ounces of water daily.  Please schedule a follow up appointment in 3 months for diabetes check.  It was a pleasure to see you today!   Preventive Care 34 Years and Older, Male Preventive care refers to lifestyle choices and visits with your health care provider that can promote health and wellness. This includes:  A yearly physical exam. This is also called an annual well check.  Regular dental and eye exams.  Immunizations.  Screening for certain conditions.  Healthy lifestyle choices, such as diet and exercise. What can I expect for my preventive care visit? Physical exam Your health care provider will check:  Height and weight. These may be used to calculate body mass index (BMI), which is a measurement that tells if you are at a healthy weight.  Heart rate and blood pressure.  Your skin for abnormal spots. Counseling Your health care provider may ask you questions about:  Alcohol, tobacco, and drug use.  Emotional well-being.  Home and relationship well-being.  Sexual activity.  Eating habits.  History of falls.  Memory and ability to understand (cognition).  Work and work Statistician. What immunizations do I need?  Influenza (flu) vaccine  This is recommended every year. Tetanus, diphtheria, and pertussis (Tdap) vaccine  You may need a Td booster every 10 years. Varicella (chickenpox) vaccine  You may need this vaccine if you have not already been  vaccinated. Zoster (shingles) vaccine  You may need this after age 37. Pneumococcal conjugate (PCV13) vaccine  One dose is recommended after age 70. Pneumococcal polysaccharide (PPSV23) vaccine  One dose is recommended after age 62. Measles, mumps, and rubella (MMR) vaccine  You may need at least one dose of MMR if you were born in 1957 or later. You may also need a second dose. Meningococcal conjugate (MenACWY) vaccine  You may need this if you have certain conditions. Hepatitis A vaccine  You may need this if you have certain conditions or if you travel or work in places where you may be exposed to hepatitis A. Hepatitis B vaccine  You may need this if you have certain conditions or if you travel or work in places where you may be exposed to hepatitis B. Haemophilus influenzae type b (Hib) vaccine  You may need this if you have certain conditions. You may receive vaccines as individual doses or as more than one vaccine together in one shot (combination vaccines). Talk with your health care provider about the risks and benefits of combination vaccines. What tests do I need? Blood tests  Lipid and cholesterol levels. These may be checked every 5 years, or more frequently depending on your overall health.  Hepatitis C test.  Hepatitis B test. Screening  Lung cancer screening. You may have this screening every year starting at age 65 if you have a 30-pack-year history of smoking and currently smoke or have quit within the past 15 years.  Colorectal cancer screening. All adults should have this screening starting at age 44 and continuing until age  47. Your health care provider may recommend screening at age 56 if you are at increased risk. You will have tests every 1-10 years, depending on your results and the type of screening test.  Prostate cancer screening. Recommendations will vary depending on your family history and other risks.  Diabetes screening. This is done by  checking your blood sugar (glucose) after you have not eaten for a while (fasting). You may have this done every 1-3 years.  Abdominal aortic aneurysm (AAA) screening. You may need this if you are a current or former smoker.  Sexually transmitted disease (STD) testing. Follow these instructions at home: Eating and drinking  Eat a diet that includes fresh fruits and vegetables, whole grains, lean protein, and low-fat dairy products. Limit your intake of foods with high amounts of sugar, saturated fats, and salt.  Take vitamin and mineral supplements as recommended by your health care provider.  Do not drink alcohol if your health care provider tells you not to drink.  If you drink alcohol: ? Limit how much you have to 0-2 drinks a day. ? Be aware of how much alcohol is in your drink. In the [place] one drink equals one 12 oz bottle of beer (355 mL), one 5 oz glass of wine (148 mL), or one 1 oz glass of hard liquor (44 mL). Lifestyle  Take daily care of your teeth and gums.  Stay active. Exercise for at least 30 minutes on 5 or more days each week.  Do not use any products that contain nicotine or tobacco, such as cigarettes, e-cigarettes, and chewing tobacco. If you need help quitting, ask your health care provider.  If you are sexually active, practice safe sex. Use a condom or other form of protection to prevent STIs (sexually transmitted infections).  Talk with your health care provider about taking a low-dose aspirin or statin. What's next?  Visit your health care provider once a year for a well check visit.  Ask your health care provider how often you should have your eyes and teeth checked.  Stay up to date on all vaccines. This information is not intended to replace advice given to you by your health care provider. Make sure you discuss any questions you have with your health care provider. Document Revised: 12/11/2018 Document Reviewed: 12/11/2018 Elsevier Patient Education   2020 [employer].

## 2020-06-12 ENCOUNTER — Other Ambulatory Visit: Payer: Self-pay | Admitting: Primary Care

## 2020-06-12 DIAGNOSIS — I779 Disorder of arteries and arterioles, unspecified: Secondary | ICD-10-CM

## 2020-06-12 DIAGNOSIS — I2510 Atherosclerotic heart disease of native coronary artery without angina pectoris: Secondary | ICD-10-CM

## 2020-06-14 NOTE — Telephone Encounter (Signed)
Refuse? Last prescribed on 12/01/2019 Last OV (cpe) with Allie Bossier on 06/10/2020. Patient saw cardiology on 04/22/2020 Future OV scheduled on 09/09/2020

## 2020-06-14 NOTE — Telephone Encounter (Signed)
Based off of our most recent visit he is no longer taking clopidogrel.  This was discontinued in April 2021.

## 2020-07-29 ENCOUNTER — Other Ambulatory Visit: Payer: Self-pay | Admitting: Internal Medicine

## 2020-09-09 ENCOUNTER — Other Ambulatory Visit: Payer: Self-pay

## 2020-09-09 ENCOUNTER — Ambulatory Visit: Payer: Medicare PPO | Admitting: Primary Care

## 2020-09-09 ENCOUNTER — Encounter: Payer: Self-pay | Admitting: Primary Care

## 2020-09-09 VITALS — BP 144/74 | HR 76 | Ht 72.0 in | Wt 227.0 lb

## 2020-09-09 DIAGNOSIS — E119 Type 2 diabetes mellitus without complications: Secondary | ICD-10-CM

## 2020-09-09 LAB — POCT GLYCOSYLATED HEMOGLOBIN (HGB A1C): Hemoglobin A1C: 6.5 % — AB (ref 4.0–5.6)

## 2020-09-09 NOTE — Assessment & Plan Note (Signed)
Improved and at goal with A1C of 6.5 today. He has not yet started Metformin, his preference. Given improvement in A1C we will continue to hold off on Metformin.  I encouraged him to work on his diet, increase physical activity. He agrees.  Follow up in 6 months.

## 2020-09-09 NOTE — Progress Notes (Signed)
Subjective:    Patient ID: [name] [name], male    DOB: [birth year], 79 y.o.   MRN: [id number]  HPI  This visit occurred during the SARS-CoV-2 public health emergency.  Safety protocols were in place, including screening questions prior to the visit, additional usage of staff PPE, and extensive cleaning of exam room while observing appropriate contact time as indicated for disinfecting solutions.   [name] is a 79 year old male with a history of hypertension, carotid artery disease, type 2 diabetes who presents today for follow up of diabetes.  Current medications include: Metformin XR 500 mg but he's not started taking this.   He is checking his blood glucose infrequently. No recent check.  Last A1C: 7.1 in June 2021 Last Eye Exam: UTD Last Foot Exam: Due Pneumonia Vaccination: Completed in 2018 ACE/ARB: Losartan  Statin: Lipitor   BP Readings from Last 3 Encounters:  09/09/20 (!) 144/74  06/10/20 136/70  04/22/20 136/70    Since his last visit he's not been exercising much due to imbalance. He has been working on his diet, has been using Splenda in his coffee rather than sugar. He's continues to drink regular soda, but has cut back.   Review of Systems  Eyes: Negative for visual disturbance.  Respiratory: Negative for shortness of breath.   Cardiovascular: Negative for chest pain.  Neurological: Negative for dizziness and headaches.       Past Medical History:  Diagnosis Date  . Basal cell carcinoma 11/09/2019   nod & infil-behind right ear-cx3 &exc  . Basal cell carcinoma 03/21/2020   Residual BCC with peripheral margin involved - ST recommends MOHs  . Carotid artery occlusion   . Cerebrovascular disease   . Coronary artery disease    s/p CABG 2005; sees Dr Johnsie Cancel yearly  . Gynecomastia   . 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 Hypercholesterolemia   . Hypertension   . Keloid   . Neurodermatitis   . Overweight(278.02)   . Personal history of colonic polyps 10/08/2006     tubular adenomas  . Renal insufficiency   . Shoulder pain   . Transient ischemic attack 2006   "lasted ~ 5 seconds"  . Vitamin D deficiency      Social History   Socioeconomic History  . Marital status: Married    Spouse name: [name] [name]  . Number of children: 1  . Years of education: Not on file  . Highest education level: Some college, no degree  Occupational History  . Occupation: retired  Tobacco Use  . Smoking status: Former Research scientist (life sciences)  . Smokeless tobacco: Former Systems developer    Types: Snuff  . Tobacco comment: uses dip 3-4 times per week  Vaping Use  . Vaping Use: Never used  Substance and Sexual Activity  . Alcohol use: No    Alcohol/week: 0.0 standard drinks  . Drug use: No  . Sexual activity: Yes  Other Topics Concern  . Not on file  Social History Narrative   Retired.   Once worked for the [employer].   Married.   Enjoys reading, spending time with family.    Social Determinants of Health   Financial Resource Strain:   . Difficulty of Paying Living Expenses: Not on file  Food Insecurity:   . Worried About Charity fundraiser in the Last Year: Not on file  . Ran Out of Food in the Last Year: Not on file  Transportation Needs:   . Lack of Transportation (Medical): Not on  file  . Lack of Transportation (Non-Medical): Not on file  Physical Activity:   . Days of Exercise per Week: Not on file  . Minutes of Exercise per Session: Not on file  Stress:   . Feeling of Stress : Not on file  Social Connections:   . Frequency of Communication with Friends and Family: Not on file  . Frequency of Social Gatherings with Friends and Family: Not on file  . Attends Religious Services: Not on file  . Active Member of Clubs or Organizations: Not on file  . Attends Archivist Meetings: Not on file  . Marital Status: Not on file  Intimate Partner Violence:   . Fear of Current or Ex-Partner: Not on file  . Emotionally Abused: Not on file  . Physically Abused: Not on file  .  Sexually Abused: Not on file    Past Surgical History:  Procedure Laterality Date  . CARDIAC CATHETERIZATION  02/2004   "tried to stent; couldn't"  . CAROTID ENDARTERECTOMY Right 08/26/2015  . CORONARY ANGIOPLASTY    . CORONARY ARTERY BYPASS GRAFT  Feb. 2005   4 vessel  . ENDARTERECTOMY Right 08/26/2015   Procedure: Right Carotid ENDARTERECTOMY with Patch Angioplasty ;  Surgeon: Rosetta Posner, MD;  Location: [facility];  Service: Vascular;  Laterality: Right;  . 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 Dessie Coma  . KELOID EXCISION    . PILONIDAL CYST EXCISION  1989    Family History  Problem Relation Age of Onset  . Heart disease Mother        Before age 58  . Diabetes Mother   . Kidney disease Mother   . Heart attack Mother 35  . Lung cancer Father 68  . Diabetes Brother   . Heart disease Brother   . Heart disease Brother   . Arthritis Brother   . Diabetes Sister   . Fibromyalgia Sister   . Lung cancer Paternal Uncle        questionable as to if it was lung ca  . Healthy Daughter   . Colon cancer Neg Hx     Allergies  Allergen Reactions  . Niacin Other (See Comments)    REACTION: intol to NIACIN w/ headaches REACTION: intol to NIACIN w/ headaches    Current Outpatient Medications on File Prior to Visit  Medication Sig Dispense Refill  . aspirin EC 81 MG tablet Take 1 tablet (81 mg total) by mouth daily.    [name] atorvastatin (LIPITOR) 20 MG tablet TAKE 1/2 TABLET BY MOUTH EVERY DAY 45 tablet 3  . blood glucose meter kit and supplies KIT Dispense based on patient and insurance preference. Use up to four times daily as directed. (FOR ICD-9 250.00, 250.01). 1 each 0  . Cholecalciferol (VITAMIN D) 2000 UNITS CAPS Take 1 capsule by mouth daily.     . hydrochlorothiazide (MICROZIDE) 12.5 MG capsule TAKE 1 CAPSULE BY MOUTH EVERY DAY 90 capsule 3  . losartan (COZAAR) 25 MG tablet TAKE 2 TABLETS BY MOUTH EVERY DAY 180 tablet 2  . metFORMIN (GLUCOPHAGE-XR) 500 MG 24 hr tablet  Take 1 tablet (500 mg total) by mouth daily with breakfast. For diabetes. 90 tablet 1  . nitroGLYCERIN (NITROSTAT) 0.4 MG SL tablet Place 1 tablet (0.4 mg total) under the tongue every 5 (five) minutes as needed for chest pain. 25 tablet 3  . vitamin B-12 (CYANOCOBALAMIN) 1000 MCG tablet Take 1,000 mcg by mouth daily.  No current facility-administered medications on file prior to visit.    BP (!) 144/74   Pulse 76   Ht 6' (1.829 m)   Wt 227 lb (103 kg)   SpO2 96%   BMI 30.79 kg/m    Objective:   Physical Exam Cardiovascular:     Rate and Rhythm: Normal rate and regular rhythm.  Pulmonary:     Effort: Pulmonary effort is normal.     Breath sounds: Normal breath sounds.  Musculoskeletal:     Cervical back: Neck supple.  Skin:    General: Skin is warm and dry.  Psychiatric:        Mood and Affect: Mood normal.            Assessment & Plan:

## 2020-09-09 NOTE — Patient Instructions (Signed)
Increase your physical activity as discussed.  It is important that you improve your diet. Please limit carbohydrates in the form of white bread, rice, pasta, sweets, fast food, fried food, sugary drinks, etc. Increase your consumption of fresh fruits and vegetables, whole grains, lean protein.  Ensure you are consuming 64 ounces of water daily.  Do not take Metformin right now as discussed.   Please schedule a follow up appointment in 6 months for diabetes check.   It was a pleasure to see you today!   Diabetes Mellitus and Nutrition, Adult When you have diabetes (diabetes mellitus), it is very important to have healthy eating habits because your blood sugar (glucose) levels are greatly affected by what you eat and drink. Eating healthy foods in the appropriate amounts, at about the same times every day, can help you:  Control your blood glucose.  Lower your risk of heart disease.  Improve your blood pressure.  Reach or maintain a healthy weight. Every person with diabetes is different, and each person has different needs for a meal plan. Your health care provider may recommend that you work with a diet and nutrition specialist (dietitian) to make a meal plan that is best for you. Your meal plan may vary depending on factors such as:  The calories you need.  The medicines you take.  Your weight.  Your blood glucose, blood pressure, and cholesterol levels.  Your activity level.  Other health conditions you have, such as heart or kidney disease. How do carbohydrates affect me? Carbohydrates, also called carbs, affect your blood glucose level more than any other type of food. Eating carbs naturally raises the amount of glucose in your blood. Carb counting is a method for keeping track of how many carbs you eat. Counting carbs is important to keep your blood glucose at a healthy level, especially if you use insulin or take certain oral diabetes medicines. It is important to know  how many carbs you can safely have in each meal. This is different for every person. Your dietitian can help you calculate how many carbs you should have at each meal and for each snack. Foods that contain carbs include:  Bread, cereal, rice, pasta, and crackers.  Potatoes and corn.  Peas, beans, and lentils.  Milk and yogurt.  Fruit and juice.  Desserts, such as cakes, cookies, ice cream, and candy. How does alcohol affect me? Alcohol can cause a sudden decrease in blood glucose (hypoglycemia), especially if you use insulin or take certain oral diabetes medicines. Hypoglycemia can be a life-threatening condition. Symptoms of hypoglycemia (sleepiness, dizziness, and confusion) are similar to symptoms of having too much alcohol. If your health care provider says that alcohol is safe for you, follow these guidelines:  Limit alcohol intake to no more than 1 drink per day for nonpregnant women and 2 drinks per day for men. One drink equals 12 oz of beer, 5 oz of wine, or 1 oz of hard liquor.  Do not drink on an empty stomach.  Keep yourself hydrated with water, diet soda, or unsweetened iced tea.  Keep in mind that regular soda, juice, and other mixers may contain a lot of sugar and must be counted as carbs. What are tips for following this plan?  Reading food labels  Start by checking the serving size on the "Nutrition Facts" label of packaged foods and drinks. The amount of calories, carbs, fats, and other nutrients listed on the label is based on one serving of the  item. Many items contain more than one serving per package.  Check the total grams (g) of carbs in one serving. You can calculate the number of servings of carbs in one serving by dividing the total carbs by 15. For example, if a food has 30 g of total carbs, it would be equal to 2 servings of carbs.  Check the number of grams (g) of saturated and trans fats in one serving. Choose foods that have low or no amount of these  fats.  Check the number of milligrams (mg) of salt (sodium) in one serving. Most people should limit total sodium intake to less than 2,300 mg per day.  Always check the nutrition information of foods labeled as "low-fat" or "nonfat". These foods may be higher in added sugar or refined carbs and should be avoided.  Talk to your dietitian to identify your daily goals for nutrients listed on the label. Shopping  Avoid buying canned, premade, or processed foods. These foods tend to be high in fat, sodium, and added sugar.  Shop around the outside edge of the grocery store. This includes fresh fruits and vegetables, bulk grains, fresh meats, and fresh dairy. Cooking  Use low-heat cooking methods, such as baking, instead of high-heat cooking methods like deep frying.  Cook using healthy oils, such as olive, canola, or sunflower oil.  Avoid cooking with butter, cream, or high-fat meats. Meal planning  Eat meals and snacks regularly, preferably at the same times every day. Avoid going long periods of time without eating.  Eat foods high in fiber, such as fresh fruits, vegetables, beans, and whole grains. Talk to your dietitian about how many servings of carbs you can eat at each meal.  Eat 4-6 ounces (oz) of lean protein each day, such as lean meat, chicken, fish, eggs, or tofu. One oz of lean protein is equal to: ? 1 oz of meat, chicken, or fish. ? 1 egg. ?  cup of tofu.  Eat some foods each day that contain healthy fats, such as avocado, nuts, seeds, and fish. Lifestyle  Check your blood glucose regularly.  Exercise regularly as told by your health care provider. This may include: ? 150 minutes of moderate-intensity or vigorous-intensity exercise each week. This could be brisk walking, biking, or water aerobics. ? Stretching and doing strength exercises, such as yoga or weightlifting, at least 2 times a week.  Take medicines as told by your health care provider.  Do not use any  products that contain nicotine or tobacco, such as cigarettes and e-cigarettes. If you need help quitting, ask your health care provider.  Work with a Social worker or diabetes educator to identify strategies to manage stress and any emotional and social challenges. Questions to ask a health care provider  Do I need to meet with a diabetes educator?  Do I need to meet with a dietitian?  What number can I call if I have questions?  When are the best times to check my blood glucose? Where to find more information:  American Diabetes Association: diabetes.org  Academy of Nutrition and Dietetics: www.eatright.[employer] of Diabetes and Digestive and Kidney Diseases (NIH): [employer] Summary  A healthy meal plan will help you control your blood glucose and maintain a healthy lifestyle.  Working with a diet and nutrition specialist (dietitian) can help you make a meal plan that is best for you.  Keep in mind that carbohydrates (carbs) and alcohol have immediate effects on your blood glucose levels. It  is important to count carbs and to use alcohol carefully. This information is not intended to replace advice given to you by your health care provider. Make sure you discuss any questions you have with your health care provider. Document Revised: 11/29/2017 Document Reviewed: 01/21/2017 Elsevier Patient Education  2020 [employer].

## 2020-10-17 ENCOUNTER — Other Ambulatory Visit: Payer: Self-pay

## 2020-10-17 ENCOUNTER — Other Ambulatory Visit (HOSPITAL_COMMUNITY): Payer: Self-pay | Admitting: Cardiovascular Disease

## 2020-10-17 ENCOUNTER — Ambulatory Visit (HOSPITAL_COMMUNITY)
Admission: RE | Admit: 2020-10-17 | Discharge: 2020-10-17 | Disposition: A | Discharge status: Home / Self Care | Payer: Medicare PPO | Source: Ambulatory Visit | Attending: Cardiovascular Disease | Admitting: Cardiovascular Disease

## 2020-10-17 DIAGNOSIS — I779 Disorder of arteries and arterioles, unspecified: Secondary | ICD-10-CM | POA: Diagnosis not present

## 2020-10-17 DIAGNOSIS — I6523 Occlusion and stenosis of bilateral carotid arteries: Secondary | ICD-10-CM

## 2020-10-17 DIAGNOSIS — Z951 Presence of aortocoronary bypass graft: Secondary | ICD-10-CM | POA: Insufficient documentation

## 2020-10-17 DIAGNOSIS — I6522 Occlusion and stenosis of left carotid artery: Secondary | ICD-10-CM | POA: Diagnosis not present

## 2020-10-17 DIAGNOSIS — Z9889 Other specified postprocedural states: Secondary | ICD-10-CM | POA: Insufficient documentation

## 2020-10-23 ENCOUNTER — Other Ambulatory Visit: Payer: Self-pay | Admitting: Cardiovascular Disease

## 2020-10-28 LAB — HM DIABETES EYE EXAM

## 2020-11-07 ENCOUNTER — Encounter: Payer: Self-pay | Admitting: Primary Care

## 2021-01-19 ENCOUNTER — Other Ambulatory Visit: Payer: Self-pay | Admitting: Cardiovascular Disease

## 2021-02-06 ENCOUNTER — Telehealth: Payer: Self-pay | Admitting: Primary Care

## 2021-02-06 NOTE — Telephone Encounter (Signed)
Pt wanted to know about placing future orders for CPE labs in june

## 2021-02-06 NOTE — Telephone Encounter (Signed)
Called patient informed we will do labs at the time of visit. No further questions.

## 2021-03-10 ENCOUNTER — Other Ambulatory Visit: Payer: Self-pay

## 2021-03-10 ENCOUNTER — Ambulatory Visit: Payer: Medicare PPO | Admitting: Primary Care

## 2021-03-10 ENCOUNTER — Encounter: Payer: Self-pay | Admitting: Primary Care

## 2021-03-10 VITALS — BP 140/82 | HR 82 | Temp 97.6°F | Ht 72.0 in | Wt 231.0 lb

## 2021-03-10 DIAGNOSIS — E538 Deficiency of other specified B group vitamins: Secondary | ICD-10-CM

## 2021-03-10 DIAGNOSIS — Z23 Encounter for immunization: Secondary | ICD-10-CM

## 2021-03-10 DIAGNOSIS — E119 Type 2 diabetes mellitus without complications: Secondary | ICD-10-CM

## 2021-03-10 DIAGNOSIS — E782 Mixed hyperlipidemia: Secondary | ICD-10-CM

## 2021-03-10 DIAGNOSIS — I2510 Atherosclerotic heart disease of native coronary artery without angina pectoris: Secondary | ICD-10-CM

## 2021-03-10 DIAGNOSIS — I10 Essential (primary) hypertension: Secondary | ICD-10-CM | POA: Diagnosis not present

## 2021-03-10 DIAGNOSIS — Z125 Encounter for screening for malignant neoplasm of prostate: Secondary | ICD-10-CM

## 2021-03-10 LAB — POCT GLYCOSYLATED HEMOGLOBIN (HGB A1C): Hemoglobin A1C: 6.4 % — AB (ref 4.0–5.6)

## 2021-03-10 NOTE — Patient Instructions (Addendum)
Continue to work on regular exercise and activity level.  Continue to work on a healthy diet.   We will see you in June 2022 for your physical.   It was a pleasure to see you today!

## 2021-03-10 NOTE — Progress Notes (Signed)
Subjective:    Patient ID: [name] [name], male    DOB: 10-02-1941, 80 y.o.   MRN: [id number]  HPI  [name] [name] is a very pleasant 80 y.o. male with a history of CAD, hypertension, TIA, type 2 diabetes, hyperlipidemia who presents today for follow up of diabetes.  Current medications include: None  He is checking his blood glucose infrequently and is getting readings of 120-130.  Last A1C: 6.5 in September 2021, 6.4 today Last Eye Exam: UTD Last Foot Exam: UTD Pneumonia Vaccination: 2018 ACE/ARB: Losartan 25 mg Statin: Lipitor 20 mg  BP Readings from Last 3 Encounters:  03/10/21 140/82  09/09/20 (!) 144/74  06/10/20 136/70   He is not exercising much, is trying to work on a healthy diet.     Review of Systems  Eyes: Negative for visual disturbance.  Respiratory: Negative for shortness of breath.   Cardiovascular: Negative for chest pain.  Neurological: Negative for dizziness.         Past Medical History:  Diagnosis Date  . Basal cell carcinoma 11/09/2019   nod & infil-behind right ear-cx3 &exc  . Basal cell carcinoma 03/21/2020   Residual BCC with peripheral margin involved - ST recommends MOHs  . Carotid artery occlusion   . Cerebrovascular disease   . Coronary artery disease    s/p CABG 2005; sees Dr Johnsie Cancel yearly  . Gynecomastia   . 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 Hypercholesterolemia   . Hypertension   . Keloid   . Neurodermatitis   . Overweight(278.02)   . Personal history of colonic polyps 10/08/2006   tubular adenomas  . Renal insufficiency   . Shoulder pain   . Transient ischemic attack 2006   "lasted ~ 5 seconds"  . Vitamin D deficiency     Social History   Socioeconomic History  . Marital status: Married    Spouse name: [name]  . Number of children: 1  . Years of education: Not on file  . Highest education level: Some college, no degree  Occupational History  . Occupation: retired  Tobacco Use  . Smoking status: Former Research scientist (life sciences)  .  Smokeless tobacco: Former Systems developer    Types: Snuff  . Tobacco comment: uses dip 3-4 times per week  Vaping Use  . Vaping Use: Never used  Substance and Sexual Activity  . Alcohol use: No    Alcohol/week: 0.0 standard drinks  . Drug use: No  . Sexual activity: Yes  Other Topics Concern  . Not on file  Social History Narrative   Retired.   Once worked for the [employer].   Married.   Enjoys reading, spending time with family.    Social Determinants of Health   Financial Resource Strain: Not on file  Food Insecurity: Not on file  Transportation Needs: Not on file  Physical Activity: Not on file  Stress: Not on file  Social Connections: Not on file  Intimate Partner Violence: Not on file    Past Surgical History:  Procedure Laterality Date  . CARDIAC CATHETERIZATION  02/2004   "tried to stent; couldn't"  . CAROTID ENDARTERECTOMY Right 08/26/2015  . CORONARY ANGIOPLASTY    . CORONARY ARTERY BYPASS GRAFT  Feb. 2005   4 vessel  . ENDARTERECTOMY Right 08/26/2015   Procedure: Right Carotid ENDARTERECTOMY with Patch Angioplasty ;  Surgeon: Rosetta Posner, MD;  Location: [facility];  Service: Vascular;  Laterality: Right;  . 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 Dessie Coma  .  KELOID EXCISION    . PILONIDAL CYST EXCISION  1989    Family History  Problem Relation Age of Onset  . Heart disease Mother        Before age 33  . Diabetes Mother   . Kidney disease Mother   . Heart attack Mother 31  . Lung cancer Father 29  . Diabetes Brother   . Heart disease Brother   . Heart disease Brother   . Arthritis Brother   . Diabetes Sister   . Fibromyalgia Sister   . Lung cancer Paternal Uncle        questionable as to if it was lung ca  . Healthy Daughter   . Colon cancer Neg Hx     Allergies  Allergen Reactions  . Niacin Other (See Comments)    REACTION: intol to NIACIN w/ headaches REACTION: intol to NIACIN w/ headaches    Current Outpatient Medications on File Prior to Visit   Medication Sig Dispense Refill  . aspirin EC 81 MG tablet Take 1 tablet (81 mg total) by mouth daily.    [name] atorvastatin (LIPITOR) 20 MG tablet TAKE 1/2 TABLET BY MOUTH EVERY DAY 45 tablet 3  . blood glucose meter kit and supplies KIT Dispense based on patient and insurance preference. Use up to four times daily as directed. (FOR ICD-9 250.00, 250.01). 1 each 0  . Cholecalciferol (VITAMIN D) 2000 UNITS CAPS Take 1 capsule by mouth daily.     . hydrochlorothiazide (MICROZIDE) 12.5 MG capsule Take 1 capsule (12.5 mg total) by mouth daily. Please make yearly appt with Dr. Johnsie Cancel for April 2022 for future refills. Thank you 1st attempt 90 capsule 0  . losartan (COZAAR) 25 MG tablet TAKE 2 TABLETS BY MOUTH EVERY DAY 180 tablet 1  . nitroGLYCERIN (NITROSTAT) 0.4 MG SL tablet Place 1 tablet (0.4 mg total) under the tongue every 5 (five) minutes as needed for chest pain. 25 tablet 3  . vitamin B-12 (CYANOCOBALAMIN) 1000 MCG tablet Take 1,000 mcg by mouth daily.     No current facility-administered medications on file prior to visit.    BP 140/82   Pulse 82   Temp 97.6 F (36.4 C) (Temporal)   Ht 6' (1.829 m)   Wt 231 lb (104.8 kg)   SpO2 97%   BMI 31.33 kg/m  Objective:   Physical Exam Cardiovascular:     Rate and Rhythm: Normal rate and regular rhythm.  Pulmonary:     Effort: Pulmonary effort is normal.     Breath sounds: Normal breath sounds. No wheezing or rales.  Musculoskeletal:     Cervical back: Neck supple.  Skin:    General: Skin is warm and dry.  Neurological:     Mental Status: He is alert and oriented to person, place, and time.           Assessment & Plan:      This visit occurred during the SARS-CoV-2 public health emergency.  Safety protocols were in place, including screening questions prior to the visit, additional usage of staff PPE, and extensive cleaning of exam room while observing appropriate contact time as indicated for disinfecting solutions.

## 2021-03-10 NOTE — Assessment & Plan Note (Signed)
Well controlled in the office today with A1C of 6.4. Continue off mediations.  Encouraged regular activity, improved diet.   Foot and eye exam UTD. Pneumonia vaccine UTD. Managed on ARB and statin.  Continue to monitor every 6 months.

## 2021-03-22 NOTE — Assessment & Plan Note (Signed)
Stable during office visit, continue hydrochlorothiazide 12.5 mg, losartan 50 milligrams, daily.

## 2021-04-03 ENCOUNTER — Inpatient Hospital Stay (HOSPITAL_COMMUNITY)
Admission: EM | Admit: 2021-04-03 | Discharge: 2021-04-06 | DRG: 065 | Disposition: A | Discharge status: Rehabilitation Facility | Payer: Medicare PPO | Attending: Internal Medicine | Admitting: Internal Medicine

## 2021-04-03 ENCOUNTER — Observation Stay (HOSPITAL_COMMUNITY): Payer: Medicare PPO

## 2021-04-03 ENCOUNTER — Emergency Department (HOSPITAL_COMMUNITY): Payer: Medicare PPO

## 2021-04-03 ENCOUNTER — Observation Stay (HOSPITAL_BASED_OUTPATIENT_CLINIC_OR_DEPARTMENT_OTHER): Payer: Medicare PPO

## 2021-04-03 ENCOUNTER — Encounter (HOSPITAL_COMMUNITY): Payer: Self-pay | Admitting: Emergency Medicine

## 2021-04-03 DIAGNOSIS — I63233 Cerebral infarction due to unspecified occlusion or stenosis of bilateral carotid arteries: Secondary | ICD-10-CM | POA: Diagnosis not present

## 2021-04-03 DIAGNOSIS — R29706 NIHSS score 6: Secondary | ICD-10-CM | POA: Diagnosis not present

## 2021-04-03 DIAGNOSIS — Z85828 Personal history of other malignant neoplasm of skin: Secondary | ICD-10-CM

## 2021-04-03 DIAGNOSIS — G8194 Hemiplegia, unspecified affecting left nondominant side: Secondary | ICD-10-CM | POA: Diagnosis present

## 2021-04-03 DIAGNOSIS — I63332 Cerebral infarction due to thrombosis of left posterior cerebral artery: Secondary | ICD-10-CM

## 2021-04-03 DIAGNOSIS — R29810 Facial weakness: Secondary | ICD-10-CM | POA: Diagnosis present

## 2021-04-03 DIAGNOSIS — I69392 Facial weakness following cerebral infarction: Secondary | ICD-10-CM | POA: Diagnosis not present

## 2021-04-03 DIAGNOSIS — E7800 Pure hypercholesterolemia, unspecified: Secondary | ICD-10-CM | POA: Diagnosis present

## 2021-04-03 DIAGNOSIS — Z6830 Body mass index (BMI) 30.0-30.9, adult: Secondary | ICD-10-CM | POA: Diagnosis not present

## 2021-04-03 DIAGNOSIS — R29700 NIHSS score 0: Secondary | ICD-10-CM | POA: Diagnosis not present

## 2021-04-03 DIAGNOSIS — F17290 Nicotine dependence, other tobacco product, uncomplicated: Secondary | ICD-10-CM | POA: Diagnosis present

## 2021-04-03 DIAGNOSIS — Z7982 Long term (current) use of aspirin: Secondary | ICD-10-CM

## 2021-04-03 DIAGNOSIS — I6381 Other cerebral infarction due to occlusion or stenosis of small artery: Principal | ICD-10-CM | POA: Diagnosis present

## 2021-04-03 DIAGNOSIS — R29705 NIHSS score 5: Secondary | ICD-10-CM | POA: Diagnosis not present

## 2021-04-03 DIAGNOSIS — R531 Weakness: Secondary | ICD-10-CM | POA: Diagnosis not present

## 2021-04-03 DIAGNOSIS — R270 Ataxia, unspecified: Secondary | ICD-10-CM | POA: Diagnosis present

## 2021-04-03 DIAGNOSIS — I639 Cerebral infarction, unspecified: Secondary | ICD-10-CM | POA: Diagnosis not present

## 2021-04-03 DIAGNOSIS — E1165 Type 2 diabetes mellitus with hyperglycemia: Secondary | ICD-10-CM | POA: Diagnosis present

## 2021-04-03 DIAGNOSIS — E669 Obesity, unspecified: Secondary | ICD-10-CM | POA: Diagnosis present

## 2021-04-03 DIAGNOSIS — R29898 Other symptoms and signs involving the musculoskeletal system: Secondary | ICD-10-CM | POA: Diagnosis not present

## 2021-04-03 DIAGNOSIS — I471 Supraventricular tachycardia: Secondary | ICD-10-CM | POA: Diagnosis present

## 2021-04-03 DIAGNOSIS — Z951 Presence of aortocoronary bypass graft: Secondary | ICD-10-CM

## 2021-04-03 DIAGNOSIS — R4781 Slurred speech: Secondary | ICD-10-CM | POA: Diagnosis present

## 2021-04-03 DIAGNOSIS — I6522 Occlusion and stenosis of left carotid artery: Secondary | ICD-10-CM | POA: Diagnosis not present

## 2021-04-03 DIAGNOSIS — I10 Essential (primary) hypertension: Secondary | ICD-10-CM | POA: Diagnosis not present

## 2021-04-03 DIAGNOSIS — I63331 Cerebral infarction due to thrombosis of right posterior cerebral artery: Secondary | ICD-10-CM | POA: Diagnosis not present

## 2021-04-03 DIAGNOSIS — N62 Hypertrophy of breast: Secondary | ICD-10-CM | POA: Diagnosis present

## 2021-04-03 DIAGNOSIS — I69390 Apraxia following cerebral infarction: Secondary | ICD-10-CM | POA: Diagnosis not present

## 2021-04-03 DIAGNOSIS — G319 Degenerative disease of nervous system, unspecified: Secondary | ICD-10-CM | POA: Diagnosis not present

## 2021-04-03 DIAGNOSIS — Z9861 Coronary angioplasty status: Secondary | ICD-10-CM

## 2021-04-03 DIAGNOSIS — R202 Paresthesia of skin: Secondary | ICD-10-CM | POA: Diagnosis not present

## 2021-04-03 DIAGNOSIS — Z20822 Contact with and (suspected) exposure to covid-19: Secondary | ICD-10-CM | POA: Diagnosis present

## 2021-04-03 DIAGNOSIS — G459 Transient cerebral ischemic attack, unspecified: Secondary | ICD-10-CM | POA: Diagnosis not present

## 2021-04-03 DIAGNOSIS — E785 Hyperlipidemia, unspecified: Secondary | ICD-10-CM | POA: Diagnosis present

## 2021-04-03 DIAGNOSIS — Z79899 Other long term (current) drug therapy: Secondary | ICD-10-CM

## 2021-04-03 DIAGNOSIS — I2510 Atherosclerotic heart disease of native coronary artery without angina pectoris: Secondary | ICD-10-CM | POA: Diagnosis present

## 2021-04-03 DIAGNOSIS — Z86010 Personal history of colonic polyps: Secondary | ICD-10-CM

## 2021-04-03 DIAGNOSIS — I679 Cerebrovascular disease, unspecified: Secondary | ICD-10-CM

## 2021-04-03 DIAGNOSIS — Z8673 Personal history of transient ischemic attack (TIA), and cerebral infarction without residual deficits: Secondary | ICD-10-CM

## 2021-04-03 DIAGNOSIS — R200 Anesthesia of skin: Secondary | ICD-10-CM | POA: Diagnosis not present

## 2021-04-03 DIAGNOSIS — I6503 Occlusion and stenosis of bilateral vertebral arteries: Secondary | ICD-10-CM | POA: Diagnosis not present

## 2021-04-03 DIAGNOSIS — I779 Disorder of arteries and arterioles, unspecified: Secondary | ICD-10-CM | POA: Diagnosis present

## 2021-04-03 DIAGNOSIS — Z833 Family history of diabetes mellitus: Secondary | ICD-10-CM | POA: Diagnosis not present

## 2021-04-03 DIAGNOSIS — I672 Cerebral atherosclerosis: Secondary | ICD-10-CM | POA: Diagnosis not present

## 2021-04-03 DIAGNOSIS — R29702 NIHSS score 2: Secondary | ICD-10-CM | POA: Diagnosis not present

## 2021-04-03 DIAGNOSIS — E119 Type 2 diabetes mellitus without complications: Secondary | ICD-10-CM | POA: Diagnosis not present

## 2021-04-03 DIAGNOSIS — I69354 Hemiplegia and hemiparesis following cerebral infarction affecting left non-dominant side: Secondary | ICD-10-CM | POA: Diagnosis not present

## 2021-04-03 DIAGNOSIS — I6523 Occlusion and stenosis of bilateral carotid arteries: Secondary | ICD-10-CM | POA: Diagnosis present

## 2021-04-03 DIAGNOSIS — I69393 Ataxia following cerebral infarction: Secondary | ICD-10-CM | POA: Diagnosis not present

## 2021-04-03 DIAGNOSIS — Z8249 Family history of ischemic heart disease and other diseases of the circulatory system: Secondary | ICD-10-CM

## 2021-04-03 LAB — TSH: TSH: 2.483 u[IU]/mL (ref 0.350–4.500)

## 2021-04-03 LAB — APTT: aPTT: 28 seconds (ref 24–36)

## 2021-04-03 LAB — URINALYSIS, ROUTINE W REFLEX MICROSCOPIC
Bilirubin Urine: NEGATIVE
Glucose, UA: NEGATIVE mg/dL
Hgb urine dipstick: NEGATIVE
Ketones, ur: NEGATIVE mg/dL
Leukocytes,Ua: NEGATIVE
Nitrite: NEGATIVE
Protein, ur: NEGATIVE mg/dL
Specific Gravity, Urine: 1.004 — ABNORMAL LOW (ref 1.005–1.030)
pH: 6 (ref 5.0–8.0)

## 2021-04-03 LAB — ECHOCARDIOGRAM COMPLETE
AR max vel: 1.9 cm2
AV Area VTI: 2.12 cm2
AV Area mean vel: 1.98 cm2
AV Mean grad: 3.5 mmHg
AV Peak grad: 6.1 mmHg
Ao pk vel: 1.24 m/s
Area-P 1/2: 3.85 cm2
S' Lateral: 3.4 cm
Single Plane A4C EF: 64.7 %

## 2021-04-03 LAB — HEMOGLOBIN A1C
Hgb A1c MFr Bld: 6.7 % — ABNORMAL HIGH (ref 4.8–5.6)
Mean Plasma Glucose: 145.59 mg/dL

## 2021-04-03 LAB — RAPID URINE DRUG SCREEN, HOSP PERFORMED
Amphetamines: NOT DETECTED
Barbiturates: NOT DETECTED
Benzodiazepines: NOT DETECTED
Cocaine: NOT DETECTED
Opiates: NOT DETECTED
Tetrahydrocannabinol: NOT DETECTED

## 2021-04-03 LAB — COMPREHENSIVE METABOLIC PANEL
ALT: 26 U/L (ref 0–44)
AST: 26 U/L (ref 15–41)
Albumin: 3.3 g/dL — ABNORMAL LOW (ref 3.5–5.0)
Alkaline Phosphatase: 68 U/L (ref 38–126)
Anion gap: 10 (ref 5–15)
BUN: 14 mg/dL (ref 8–23)
CO2: 29 mmol/L (ref 22–32)
Calcium: 8.7 mg/dL — ABNORMAL LOW (ref 8.9–10.3)
Chloride: 98 mmol/L (ref 98–111)
Creatinine, Ser: 1.25 mg/dL — ABNORMAL HIGH (ref 0.61–1.24)
GFR, Estimated: 59 mL/min — ABNORMAL LOW (ref >60)
Glucose, Bld: 190 mg/dL — ABNORMAL HIGH (ref 70–99)
Potassium: 3.5 mmol/L (ref 3.5–5.1)
Sodium: 137 mmol/L (ref 135–145)
Total Bilirubin: 0.9 mg/dL (ref 0.3–1.2)
Total Protein: 6.2 g/dL — ABNORMAL LOW (ref 6.5–8.1)

## 2021-04-03 LAB — LIPID PANEL
Cholesterol: 143 mg/dL (ref 0–200)
HDL: 31 mg/dL — ABNORMAL LOW (ref >40)
LDL Cholesterol: 86 mg/dL (ref 0–99)
Total CHOL/HDL Ratio: 4.6 RATIO
Triglycerides: 130 mg/dL (ref <150)
VLDL: 26 mg/dL (ref 0–40)

## 2021-04-03 LAB — RESP PANEL BY RT-PCR (FLU A&B, COVID) ARPGX2
Influenza A by PCR: NEGATIVE
Influenza B by PCR: NEGATIVE
SARS Coronavirus 2 by RT PCR: NEGATIVE

## 2021-04-03 LAB — DIFFERENTIAL
Abs Immature Granulocytes: 0.08 10*3/uL — ABNORMAL HIGH (ref 0.00–0.07)
Basophils Absolute: 0 10*3/uL (ref 0.0–0.1)
Basophils Relative: 1 %
Eosinophils Absolute: 0.2 10*3/uL (ref 0.0–0.5)
Eosinophils Relative: 3 %
Immature Granulocytes: 1 %
Lymphocytes Relative: 20 %
Lymphs Abs: 1.2 10*3/uL (ref 0.7–4.0)
Monocytes Absolute: 0.5 10*3/uL (ref 0.1–1.0)
Monocytes Relative: 9 %
Neutro Abs: 4.1 10*3/uL (ref 1.7–7.7)
Neutrophils Relative %: 66 %

## 2021-04-03 LAB — CBC
HCT: 46.6 % (ref 39.0–52.0)
Hemoglobin: 16 g/dL (ref 13.0–17.0)
MCH: 34.8 pg — ABNORMAL HIGH (ref 26.0–34.0)
MCHC: 34.3 g/dL (ref 30.0–36.0)
MCV: 101.3 fL — ABNORMAL HIGH (ref 80.0–100.0)
Platelets: 251 10*3/uL (ref 150–400)
RBC: 4.6 MIL/uL (ref 4.22–5.81)
RDW: 13.1 % (ref 11.5–15.5)
WBC: 6.2 10*3/uL (ref 4.0–10.5)
nRBC: 0 % (ref 0.0–0.2)

## 2021-04-03 LAB — PROTIME-INR
INR: 1 (ref 0.8–1.2)
Prothrombin Time: 12.7 seconds (ref 11.4–15.2)

## 2021-04-03 LAB — ETHANOL: Alcohol, Ethyl (B): <10 mg/dL (ref <10)

## 2021-04-03 MED ORDER — CLOPIDOGREL BISULFATE 300 MG PO TABS
300.0000 mg | ORAL_TABLET | Freq: Once | ORAL | Status: AC
  Administered 2021-04-03: 300 mg via ORAL
  Filled 2021-04-03: qty 1

## 2021-04-03 MED ORDER — SODIUM CHLORIDE 0.9 % IV SOLN: INTRAVENOUS | Status: DC

## 2021-04-03 MED ORDER — ATORVASTATIN CALCIUM 10 MG PO TABS: 10.0000 mg | ORAL_TABLET | Freq: Every day | ORAL | Status: DC

## 2021-04-03 MED ORDER — ASPIRIN EC 81 MG PO TBEC
81.0000 mg | DELAYED_RELEASE_TABLET | Freq: Every day | ORAL | Status: DC
  Administered 2021-04-03 – 2021-04-06 (×4): 81 mg via ORAL
  Filled 2021-04-03 (×4): qty 1

## 2021-04-03 MED ORDER — CLOPIDOGREL BISULFATE 75 MG PO TABS
75.0000 mg | ORAL_TABLET | Freq: Every day | ORAL | Status: DC
  Administered 2021-04-04 – 2021-04-06 (×3): 75 mg via ORAL
  Filled 2021-04-03 (×3): qty 1

## 2021-04-03 MED ORDER — STROKE: EARLY STAGES OF RECOVERY BOOK: Freq: Once | Status: AC

## 2021-04-03 MED ORDER — LORAZEPAM 2 MG/ML IJ SOLN
1.0000 mg | Freq: Once | INTRAMUSCULAR | Status: AC
  Administered 2021-04-03: 1 mg via INTRAVENOUS
  Filled 2021-04-03: qty 1

## 2021-04-03 MED ORDER — ACETAMINOPHEN 650 MG RE SUPP: 650.0000 mg | RECTAL | Status: DC | PRN

## 2021-04-03 MED ORDER — ENOXAPARIN SODIUM 40 MG/0.4ML ~~LOC~~ SOLN
40.0000 mg | SUBCUTANEOUS | Status: DC
  Administered 2021-04-03 – 2021-04-06 (×4): 40 mg via SUBCUTANEOUS
  Filled 2021-04-03 (×4): qty 0.4

## 2021-04-03 MED ORDER — ACETAMINOPHEN 160 MG/5ML PO SOLN: 650.0000 mg | ORAL | Status: DC | PRN

## 2021-04-03 MED ORDER — SENNOSIDES-DOCUSATE SODIUM 8.6-50 MG PO TABS: 1.0000 | ORAL_TABLET | Freq: Every evening | ORAL | Status: DC | PRN

## 2021-04-03 MED ORDER — IOHEXOL 350 MG/ML SOLN
75.0000 mL | Freq: Once | INTRAVENOUS | Status: AC | PRN
  Administered 2021-04-03: 75 mL via INTRAVENOUS

## 2021-04-03 MED ORDER — ATORVASTATIN CALCIUM 40 MG PO TABS
40.0000 mg | ORAL_TABLET | Freq: Every day | ORAL | Status: DC
  Administered 2021-04-03 – 2021-04-06 (×4): 40 mg via ORAL
  Filled 2021-04-03 (×4): qty 1

## 2021-04-03 MED ORDER — ACETAMINOPHEN 325 MG PO TABS: 650.0000 mg | ORAL_TABLET | ORAL | Status: DC | PRN

## 2021-04-03 NOTE — ED Notes (Signed)
Patient transported to CT 

## 2021-04-03 NOTE — ED Notes (Signed)
Dr. Lorin Mercy at bedside.  Neurologist paged.

## 2021-04-03 NOTE — ED Triage Notes (Signed)
Pt here from home with c/o left sided weakness that started around 1 am , pt states that it all went away at 0345 , no weakness at present

## 2021-04-03 NOTE — ED Notes (Signed)
Patient transported to MRI with primary RN

## 2021-04-03 NOTE — ED Notes (Signed)
Dr. Erlinda Hong paged. Notified Camera operator of the event.

## 2021-04-03 NOTE — ED Notes (Signed)
Neurologist called back but told me to call Dr. Erlinda Hong via chat messaged.

## 2021-04-03 NOTE — H&P (Addendum)
History and Physical    [name] [name]:[id number] DOB: 04/01/1941 DOA: 04/03/2021  PCP: [name] Koch, NP Consultants:  [name] [name] - cardiology; [name] [name] - neurology Patient coming from:  Home - lives with wife; NOK: Wife, [name] [name], [phone]   Chief Complaint: L-sided weakness  HPI: [name] [name] is a 80 y.o. male with medical history significant of TIA; HTN; HLD; and CAD s/p CABG presenting with L-sided weakness.  About 130 this AM, he woke up needing to urinate.  He noticed numbness in his left lower face and left index finger and thumb.  Also in L leg with walking.  Numbness continued until about 330AM and resolved. He told his wife, she told his daughter, and they made him come to the ER.  No dysphagia or dysarthria.  No weakness, just numbness.    ED Course:  Carryover, per Dr. Alcario Drought:  Pt with TIA, facial numbness, hand, and L leg, all resolved. Seen by neuro.  Review of Systems: As per HPI; otherwise review of systems reviewed and negative.   Ambulatory Status:  Ambulates without assistance  COVID Vaccine Status:  Complete  Past Medical History:  Diagnosis Date  . Basal cell carcinoma 11/09/2019   nod & infil-behind right ear-cx3 &exc  . Basal cell carcinoma 03/21/2020   Residual BCC with peripheral margin involved - ST recommends MOHs  . Carotid artery occlusion   . Coronary artery disease    s/p CABG 2005; sees Dr [name] [name] yearly  . Gynecomastia   . Hypercholesterolemia   . Hypertension   . Neurodermatitis   . Overweight(278.02)   . Personal history of colonic polyps 10/08/2006   tubular adenomas  . Renal insufficiency   . Transient ischemic attack 2010   "lasted ~ 5 seconds"  . Vitamin D deficiency     Past Surgical History:  Procedure Laterality Date  . CARDIAC CATHETERIZATION  02/2004   "tried to stent; couldn't"  . CAROTID ENDARTERECTOMY Right 08/26/2015  . CORONARY ANGIOPLASTY    . CORONARY ARTERY BYPASS GRAFT  Feb. 2005   4 vessel  .  ENDARTERECTOMY Right 08/26/2015   Procedure: Right Carotid ENDARTERECTOMY with Patch Angioplasty ;  Surgeon: Rosetta Posner, MD;  Location: [facility];  Service: Vascular;  Laterality: Right;  . 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 Dessie Coma  . KELOID EXCISION    . PILONIDAL CYST EXCISION  1989    Social History   Socioeconomic History  . Marital status: Married    Spouse name: [name] [name]  . Number of children: 1  . Years of education: Not on file  . Highest education level: Some college, no degree  Occupational History  . Occupation: retired  Tobacco Use  . Smoking status: Never Smoker  . Smokeless tobacco: Current User    Types: Snuff  . Tobacco comment: uses dip 3-4 times per week  Vaping Use  . Vaping Use: Never used  Substance and Sexual Activity  . Alcohol use: No    Alcohol/week: 0.0 standard drinks  . Drug use: No  . Sexual activity: Yes  Other Topics Concern  . Not on file  Social History Narrative   Retired.   Once worked for the [employer].   Married.   Enjoys reading, spending time with family.    Social Determinants of Health   Financial Resource Strain: Not on file  Food Insecurity: Not on file  Transportation Needs: Not on file  Physical Activity: Not on file  Stress: Not  on file  Social Connections: Not on file  Intimate Partner Violence: Not on file    Allergies  Allergen Reactions  . Niacin Other (See Comments)    REACTION: intol to NIACIN w/ headaches REACTION: intol to NIACIN w/ headaches    Family History  Problem Relation Age of Onset  . Heart disease Mother        Before age 28  . Diabetes Mother   . Kidney disease Mother   . Heart attack Mother 93  . Lung cancer Father 49  . Diabetes Brother   . Heart disease Brother   . Heart disease Brother   . Arthritis Brother   . Diabetes Sister   . Fibromyalgia Sister   . Lung cancer Paternal Uncle        questionable as to if it was lung ca  . Healthy Daughter   . Colon cancer Neg Hx   .  Stroke Neg Hx     Prior to Admission medications   Medication Sig Start Date End Date Taking? Authorizing Provider  aspirin EC 81 MG tablet Take 1 tablet (81 mg total) by mouth daily. 04/22/20  Yes Josue Hector, MD  atorvastatin (LIPITOR) 20 MG tablet TAKE 1/2 TABLET BY MOUTH EVERY DAY Patient taking differently: Take 10 mg by mouth daily. 07/29/20  Yes [name] Koch, NP  blood glucose meter kit and supplies KIT Dispense based on patient and insurance preference. Use up to four times daily as directed. (FOR ICD-9 250.00, 250.01). 06/10/20  Yes [name] Koch, NP  Cholecalciferol (VITAMIN D) 2000 UNITS CAPS Take 2,000 Units by mouth daily.   Yes [provider]  hydrochlorothiazide (MICROZIDE) 12.5 MG capsule Take 1 capsule (12.5 mg total) by mouth daily. Please make yearly appt with Dr. Johnsie [name] for April 2022 for future refills. Thank you 1st attempt Patient taking differently: Take 12.5 mg by mouth daily. 01/20/21  Yes Josue Hector, MD  losartan (COZAAR) 25 MG tablet TAKE 2 TABLETS BY MOUTH EVERY DAY Patient taking differently: Take 50 mg by mouth daily. 10/24/20  Yes Josue Hector, MD  nitroGLYCERIN (NITROSTAT) 0.4 MG SL tablet Place 1 tablet (0.4 mg total) under the tongue every 5 (five) minutes as needed for chest pain. 02/22/16  Yes Josue Hector, MD  vitamin B-12 (CYANOCOBALAMIN) 1000 MCG tablet Take 1,000 mcg by mouth daily.   Yes [provider]    Physical Exam: Vitals:   04/03/21 1100 04/03/21 1300 04/03/21 1500 04/03/21 1700  BP: (!) 173/80 (!) 145/71 (!) 156/61 (!) 145/68  Pulse: 68 78 68 70  Resp: _0 Temp: 98.9 F (37.2 C) 98.6 F (37 C) 98.6 F (37 C) 98.7 F (37.1 C)  TempSrc: Oral Oral Oral Oral  SpO2: 95% 98% 92% 96%     . General:  Appears calm and comfortable and is in NAD; anxious regarding recurrent symptoms of periodic L lower face numbness and L thumb and forefinger numbness . Eyes:  PERRL, EOMI, normal lids,  iris . ENT:  grossly normal hearing, lips & tongue, mmm . Neck:  no LAD, masses or thyromegaly; no carotid bruits . Cardiovascular:  RRR, no m/r/g. No LE edema.  [name] Respiratory:   CTA bilaterally with no wheezes/rales/rhonchi.  Mildly increased respiratory effort. . Abdomen:  soft, NT, ND . Skin:  no rash or induration seen on limited exam . Musculoskeletal:  grossly normal tone BUE/BLE, good ROM, no bony abnormality . Lower extremity:  No LE edema.  Limited foot exam with no ulcerations.  2+ distal pulses. [name] Psychiatric:  Mildly anxious mood and affect, speech fluent and appropriate, AOx3 . Neurologic:  CN 2-12 grossly intact with possible very subtle L facial droop, moves all extremities in coordinated fashion, sensation intact    Radiological Exams on Admission: Independently reviewed - see discussion in A/P where applicable  CT ANGIO HEAD NECK W WO CM (CODE STROKE)  Result Date: 04/03/2021 CLINICAL DATA:  Stroke follow-up. EXAM: CT ANGIOGRAPHY HEAD AND NECK TECHNIQUE: Multidetector CT imaging of the head and neck was performed using the standard protocol during bolus administration of intravenous contrast. Multiplanar CT image reconstructions and MIPs were obtained to evaluate the vascular anatomy. Carotid stenosis measurements (when applicable) are obtained utilizing NASCET criteria, using the distal internal carotid diameter as the denominator. CONTRAST:  26m OMNIPAQUE IOHEXOL 350 MG/ML SOLN COMPARISON:  Same day head CT.  MRA June 04, 2009. FINDINGS: CTA NECK FINDINGS Aortic arch: Great vessel origins are patent. Atherosclerosis of the aorta and branch vessels. Right carotid system: Atherosclerosis of the common carotid artery and at the carotid bifurcation without greater than 50% stenosis. Left carotid system: Mixed calcific and noncalcific atherosclerosis at the carotid bifurcation and involving the proximal ICA with approximately 70-80% stenosis of the proximal ICA. Vertebral arteries:  Left dominant. Approximately 50% stenosis of the right vertebral artery origin secondary to atherosclerosis. Mild narrowing of the left vertebral artery origin secondary to predominately calcific atherosclerosis. Remainder of the vertebral arteries are patent bilaterally. z Skeleton: Mild multilevel degenerative disc disease. Other neck: No mass or suspicious adenopathy. Upper chest: Visualized lung apices are clear. Partially imaged median sternotomy. Review of the MIP images confirms the above findings CTA HEAD FINDINGS Anterior circulation: Bilateral calcific atherosclerosis of the cavernous and paraclinoid ICAs with approximately 60% stenosis of the left cavernous ICA. No evidence of greater than 50% stenosis on the right. Bilateral MCA and ACAs are patent without evidence of proximal hemodynamically significant stenosis. 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 Trifurcation of the ACA. No aneurysm identified. Posterior circulation: Bilateral intradural vertebral arteries and basilar artery are patent with mild narrowing of the right vertebral artery at its dural margin. Left fetal type PCA. Bilateral PCAs are patent without evidence of proximal hemodynamically significant stenosis. No aneurysm identified Venous sinuses: As permitted by contrast timing, patent. Anatomic variants: As detailed above Review of the MIP images confirms the above findings IMPRESSION: CTA Head: 1. No emergent large vessel occlusion. 2. 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 CTA Neck: 1. Approximately 70-80% stenosis of the proximal internal carotid artery just distal to the carotid bifurcation. 2. Approximately 50% stenosis of the right vertebral artery origin. Electronically Signed   By: FMargaretha SheffieldMD   On: 04/03/2021 08:25   CT HEAD WO CONTRAST  Result Date: 04/03/2021 CLINICAL DATA:  80year old male with numbness tingling, paresthesia. EXAM:  CT HEAD WITHOUT CONTRAST TECHNIQUE: Contiguous axial images were obtained from the base of the skull through the vertex without intravenous contrast. COMPARISON:  Brain MRI 06/04/2009.  Head CT 06/03/2009. FINDINGS: Brain: No midline shift, mass effect, or evidence of intracranial mass lesion. No ventriculomegaly. No acute intracranial hemorrhage identified. Patchy and confluent hypodensity in the bilateral cerebral white matter, deep gray matter nuclei. This is mildly progressed since 2010. No cortically based acute infarct identified. No cortical encephalomalacia identified. Vascular: Calcified atherosclerosis at the skull base. No suspicious  intracranial vascular hyperdensity. Skull: Stable.  No acute osseous abnormality identified. Sinuses/Orbits: Visualized paranasal sinuses and mastoids are stable and well pneumatized. Other: Tiny right forehead scalp lipoma (benign series 4, image 37). Postoperative changes to both globes. No acute orbit or scalp soft tissue finding. IMPRESSION: 1. No acute intracranial abnormality identified. 2. Advanced chronic small vessel disease. Mild progression since 2010. Electronically Signed   By: Genevie Ann M.D.   On: 04/03/2021 06:02   MR BRAIN WO CONTRAST  Result Date: 04/03/2021 CLINICAL DATA:  TIA. Left facial numbness and left leg weakness, now resolved. EXAM: MRI HEAD WITHOUT CONTRAST TECHNIQUE: Multiplanar, multiecho pulse sequences of the brain and surrounding structures were obtained without intravenous contrast. COMPARISON:  Head CT and CTA 04/03/2021.  Head MRI 06/04/2009. FINDINGS: The study is mildly motion degraded. Brain: There is an 8 mm focus of mild trace diffusion weighted signal hyperintensity laterally in the right thalamus with the suggestion of subtly reduced ADC (most conspicuous on the coronal ADC map) likely reflecting an acute infarct with this clinical history. Patchy to confluent T2 hyperintensities in the cerebral white matter bilaterally have mildly  progressed from the prior MRI and are nonspecific but compatible with moderately severe chronic small vessel ischemic disease. Chronic lacunar infarcts are again noted in the deep cerebral white matter bilaterally, right basal ganglia, left thalamus, and pons. There is mild generalized cerebral atrophy. No intracranial hemorrhage, mass, midline shift, or extra-axial fluid collection is identified. Vascular: Major intracranial vascular flow voids are preserved. Skull and upper cervical spine: Unremarkable bone marrow signal. Sinuses/Orbits: Unremarkable orbits. Trace right mastoid effusion. Clear paranasal sinuses. Other: Small right frontal scalp lipoma. IMPRESSION: 1. Suspected small acute right thalamic infarct. 2. 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 Electronically Signed   By: Logan Bores M.D.   On: 04/03/2021 11:02   ECHOCARDIOGRAM COMPLETE  Result Date: 04/03/2021    ECHOCARDIOGRAM REPORT   Patient Name:   LAYMAN GULLY Date of Exam: 04/03/2021 Medical Rec #:  [id number]        Height:       72.0 in Accession #:    [id number]       Weight:       231.0 lb Date of Birth:  [birth year]       BSA:          2.265 m Patient Age:    57 years         BP:           159/76 mmHg Patient Gender: M                HR:           71 bpm. Exam Location:  Inpatient Procedure: 2D Echo, Cardiac Doppler and Color Doppler Indications:    TIA  History:        Patient has no prior history of Echocardiogram examinations.                 CAD, TIA; Risk Factors:Hypertension and Dyslipidemia.  Sonographer:    [place] Referring Phys: [id number]   1. Left ventricular ejection fraction, by estimation, is 60 to 65%. The left ventricle has normal function. The left ventricle has no regional wall motion abnormalities. Left ventricular diastolic parameters are indeterminate.  2. Right ventricular systolic function is normal. The right ventricular size is  normal.  3. The mitral valve is normal in structure. No evidence of mitral  valve regurgitation. No evidence of mitral stenosis.  4. The aortic valve is tricuspid. There is mild calcification of the aortic valve. Aortic valve regurgitation is not visualized. Mild to moderate aortic valve sclerosis/calcification is present, without any evidence of aortic stenosis.  5. The inferior vena cava is normal in size with greater than 50% respiratory variability, suggesting right atrial pressure of 3 mmHg. FINDINGS  Left Ventricle: Left ventricular ejection fraction, by estimation, is 60 to 65%. The left ventricle has normal function. The left ventricle has no regional wall motion abnormalities. The left ventricular internal cavity size was normal in size. There is  no left ventricular hypertrophy. Left ventricular diastolic parameters are indeterminate. Right Ventricle: The right ventricular size is normal. No increase in right ventricular wall thickness. Right ventricular systolic function is normal. Left Atrium: Left atrial size was normal in size. Right Atrium: Right atrial size was normal in size. Pericardium: There is no evidence of pericardial effusion. Mitral Valve: The mitral valve is normal in structure. No evidence of mitral valve regurgitation. No evidence of mitral valve stenosis. Tricuspid Valve: The tricuspid valve is normal in structure. Tricuspid valve regurgitation is trivial. No evidence of tricuspid stenosis. Aortic Valve: The aortic valve is tricuspid. There is mild calcification of the aortic valve. Aortic valve regurgitation is not visualized. Mild to moderate aortic valve sclerosis/calcification is present, without any evidence of aortic stenosis. Aortic valve mean gradient measures 3.5 mmHg. Aortic valve peak gradient measures 6.1 mmHg. Aortic valve area, by VTI measures 2.12 cm. Pulmonic Valve: The pulmonic valve was normal in structure. Pulmonic valve regurgitation is mild. No evidence of pulmonic  stenosis. Aorta: The aortic root is normal in size and structure. Venous: The inferior vena cava is normal in size with greater than 50% respiratory variability, suggesting right atrial pressure of 3 mmHg. IAS/Shunts: The interatrial septum was not well visualized.  LEFT VENTRICLE PLAX 2D LVIDd:         4.60 cm LVIDs:         3.40 cm LV PW:         1.30 cm LV IVS:        1.40 cm LVOT diam:     1.80 cm LV SV:         53 LV SV Index:   23 LVOT Area:     2.54 cm  LV Volumes (MOD) LV vol d, MOD A2C: 35.9 ml LV vol d, MOD A4C: 70.2 ml LV vol s, MOD A4C: 24.8 ml LV SV MOD A4C:     70.2 ml RIGHT VENTRICLE TAPSE (M-mode): 1.2 cm LEFT ATRIUM             Index       RIGHT ATRIUM           Index LA Vol (A2C):   59.6 ml 26.31 ml/m RA Area:     13.10 cm LA Vol (A4C):   52.6 ml 23.22 ml/m RA Volume:   28.50 ml  12.58 ml/m LA Biplane Vol: 57.4 ml 25.34 ml/m  AORTIC VALVE                   PULMONIC VALVE AV Area (Vmax):    1.90 cm    PV Vmax:       0.97 m/s AV Area (Vmean):   1.98 cm    PV Vmean:      71.800 cm/s AV Area (VTI):     2.12 cm    PV VTI:  0.216 m AV Vmax:           123.50 cm/s PV Peak grad:  3.8 mmHg AV Vmean:          88.350 cm/s PV Mean grad:  2.0 mmHg AV VTI:            0.249 m AV Peak Grad:      6.1 mmHg AV Mean Grad:      3.5 mmHg LVOT Vmax:         92.30 cm/s LVOT Vmean:        68.700 cm/s LVOT VTI:          0.207 m LVOT/AV VTI ratio: 0.83  AORTA Ao Root diam: 3.40 cm Ao Asc diam:  2.90 cm MITRAL VALVE MV Area (PHT): 3.85 cm    SHUNTS MV Decel Time: 197 msec    Systemic VTI:  0.21 m MV E velocity: 70.40 cm/s  Systemic Diam: 1.80 cm MV A velocity: 77.10 cm/s MV E/A ratio:  0.91 [name] MD Electronically signed by [name] MD Signature Date/Time: 04/03/2021/3:27:08 PM    Final     EKG: Independently reviewed.  NSR with rate 85; nonspecific ST changes with no evidence of acute ischemia   Labs on Admission: I have personally reviewed the available labs and imaging studies at the time of  the admission.  Pertinent labs:   Glucose 190  BUN 14/Creatinine 1.25/GFR 59 Albumin 3.3 Unremarkable CBC INR 1.0 COVID/flu negative UA unremarkable ETOH <10 UDS negative   Assessment/Plan Principal Problem:   TIA (transient ischemic attack) Active Problems:   Essential hypertension   Type 2 diabetes mellitus (HCC)   Carotid artery disease (HCC)   L-sided numbness -Concerning for TIA/CVA -ABCD2 score is  -tPA can be given within 4.5 hours of symptom onset; this patient was not deemed to be a candidate for tPA therapy due to mild symptoms; unknown length of symptoms; Plavix administration -Aspirin has been given to reduce stroke mortality and decrease morbidity -Stuttering symptoms are concerning for ischemia -Will place in observation status for CVA/TIA evaluation -Telemetry monitoring -MRI -Carotid dopplers; if ipsilateral carotid stenosis is detected then prompt vascular surgery consultation is needed for consideration of CEA. -Echo -If the patient does not have known afib and this is not detected on telemetry during hospitalization, consider outpatient Holter monitoring and/or loop recorder placement. -Risk stratification with FLP, A1c; will also check TSH -Patient will need DAPT for 21 days when ABCD2 score is at least 4 and NIH score is 3 or less, and then can transition to monotherapy with a single antiplatelet agent.   -Neurology consult -PT/OT/ST/Nutrition Consults  HTN -Allow permissive HTN for now -Treat BP only if >220/120, and then with goal of 15% reduction -Hold HCTZ and Cozaar and plan to restart in 48-72 hours   HLD -Check FLP -Resume statin but will increase Lipitor from 10 to 40 mg daily   CAD -s/p CABG -Continue ASA  Carotid stenosis -Prior h/o R CEA -CTA with concern for L-sided stenosis which would be appropriate for outpatient vascular f/u -Consider carotid US to better define R-sided stenosis although thalamic CVA less likely associated  with carotid disease     Note: This patient has been tested and is negative for the novel coronavirus COVID-19. He has been fully vaccinated against COVID-19.    DVT prophylaxis:  Lovenox  Code Status: Full - confirmed with patient/family Family Communication: Wife present throughout evaluation Disposition Plan:  The patient is from: home  Anticipated d/c is to: home without [facility] services   Anticipated d/c date will depend on clinical response to treatment, but possibly as early as tomorrow if he has excellent response to treatment  Patient is currently: acutely ill Consults called: Neurology; PT/OT/ST/Nutrition Admission status: It is my clinical opinion that referral for OBSERVATION is reasonable and necessary in this patient based on the above information provided. The aforementioned taken together are felt to place the patient at high risk for further clinical deterioration. However it is anticipated that the patient may be medically stable for discharge from the hospital within 24 to 48 hours.    Karmen Bongo MD [facility]   How to contact the [facility] Attending or Consulting provider [place] or covering provider during after hours [place], for this patient?  1. Check the care team in [facility] and look for a) attending/consulting [facility] provider listed and b) the [facility] team listed 2. Log into [email or web address] and use [place]'s universal password to access. If you do not have the password, please contact the hospital operator. 3. Locate the [facility] provider you are looking for under [facility] and page to a number that you can be directly reached. 4. If you still have difficulty reaching the provider, please page the [facility] (Director on Call) for the Hospitalists listed on amion for assistance.   04/03/2021, 5:58 PM

## 2021-04-03 NOTE — Consult Note (Signed)
Neurology Consultation Reason for Consult: TIA Referring Physician: Christy Gentles, D  CC: Left sided weakness  History is obtained from:Patient  HPI: [name] [name] is a 80 y.o. male who was in his normal state of health when he went to bed at 10 PM.  He then awoke around 1:30 AM to go to the bathroom and noticed that he had some numbness in the left side of his face.  He then tried to stand up and noticed that his left leg was slightly weak as well.  He was able to finally walk to the bathroom, however.  This all resolved around 3:30 AM.  He denies any vision changes, headache, vertigo.   LKW: 10 PM tpa given?: no, resolution of symptoms    ROS: A 14 point ROS was performed and is negative except as noted in the HPI.   Past Medical History:  Diagnosis Date  . Basal cell carcinoma 11/09/2019   nod & infil-behind right ear-cx3 &exc  . Basal cell carcinoma 03/21/2020   Residual BCC with peripheral margin involved - ST recommends MOHs  . Carotid artery occlusion   . Cerebrovascular disease   . Coronary artery disease    s/p CABG 2005; sees Dr Johnsie Cancel yearly  . Gynecomastia   . 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 Hypercholesterolemia   . Hypertension   . Keloid   . Neurodermatitis   . Overweight(278.02)   . Personal history of colonic polyps 10/08/2006   tubular adenomas  . Renal insufficiency   . Shoulder pain   . Transient ischemic attack 2006   "lasted ~ 5 seconds"  . Vitamin D deficiency      Family History  Problem Relation Age of Onset  . Heart disease Mother        Before age 37  . Diabetes Mother   . Kidney disease Mother   . Heart attack Mother 66  . Lung cancer Father 46  . Diabetes Brother   . Heart disease Brother   . Heart disease Brother   . Arthritis Brother   . Diabetes Sister   . Fibromyalgia Sister   . Lung cancer Paternal Uncle        questionable as to if it was lung ca  . Healthy Daughter   . Colon cancer Neg Hx      Social History: Uses smokeless  tobacco.   Exam: Current vital signs: BP (!) 183/87   Pulse 80   Temp 98.3 F (36.8 C) (Oral)   Resp 20   SpO2 95%  Vital signs in last 24 hours: Temp:  [98.3 F (36.8 C)] 98.3 F (36.8 C) (04/04 0429) Pulse Rate:  [80-88] 80 (04/04 0508) Resp:  [18-20] 20 (04/04 0508) BP: (151-183)/(87) 183/87 (04/04 0507) SpO2:  [95 %] 95 % (04/04 0508)   Physical Exam  Constitutional: Appears well-developed and well-nourished.  Psych: Affect appropriate to situation Eyes: No scleral injection HENT: No OP obstruction MSK: no joint deformities.  Cardiovascular: Normal rate and regular rhythm.  Respiratory: Effort normal, non-labored breathing GI: Soft.  No distension. There is no tenderness.  Skin: WDI  Neuro: Mental Status: Patient is awake, alert, oriented to person, place, month, year, and situation. Patient is able to give a clear and coherent history. No signs of aphasia or neglect Cranial Nerves: II: Visual Fields are full. Pupils are equal, round, and reactive to light.   III,IV, VI: EOMI without ptosis or diploplia.  V: Facial sensation is symmetric to temperature VII: Facial  movement is symmetric.  VIII: hearing is intact to voice X: Uvula elevates symmetrically XI: Shoulder shrug is symmetric. XII: tongue is midline without atrophy or fasciculations.  Motor: Tone is normal. Bulk is normal. 5/5 strength was present in all four extremities.  Sensory: Sensation is symmetric to light touch and temperature in the arms and legs. Deep Tendon Reflexes: 2+ and symmetric in the biceps and patellae.  Plantars: Toes are downgoing bilaterally.  Cerebellar: FNF  intact bilaterally      I have reviewed labs in epic and the results pertinent to this consultation are: Cr 1.25, glucose 190  I have reviewed the images obtained: CT head - negative  Impression: 80 year old male with a history of hyperlipidemia, hypertension, recently diagnosed diabetes who presents with  left-sided numbness and weakness most consistent with transient ischemic attack.  He will need to have further evaluation for secondary risk factor modification.  Recommendations: - HgbA1c, fasting lipid panel - MRI, MRA  of the brain without contrast - Frequent neuro checks - Echocardiogram - Carotid dopplers - Prophylactic therapy-Antiplatelet med: Aspirin -81 mg daily with Plavix 75 mg after 300 mg load - Risk factor modification - Telemetry monitoring - PT consult, OT consult, Speech consult - Stroke team to follow    Roland Rack, MD [facility] [phone]  If 7pm- 7am, please page neurology on call as listed in [place].

## 2021-04-03 NOTE — ED Notes (Signed)
Urine Requested.

## 2021-04-03 NOTE — ED Notes (Addendum)
Pt arrived to the room and asked to go to the bathroom. Per Nurse tech, he was allowed to ambulate. When pt stood up, he seemed unstable but pt said probably because he has been laying on his bed. This RN assisted the pt to the bathroom and noticed that he needed moderate assist. Once pt was brought back to his room I explained to him what I noticed, pt was not very aware how unstable he was. Pt then reports that he was having numbness in his left cheek and left fingers. Messaged Dr. Lorin Mercy. Within 5-8 minutes his numbness and small left facial droop went away. I left the room and had Dr. Lorin Mercy paged. During this event NIH was done, it was 5,  (left upper and lower ataxia, numbness in left side of face, fingers and facial droop). Report from previous nurse was NIH 0.    I ambulated the patient again because he said "I can probably walk better now". Pt, indeed was more stable when gait was retested. Pt at this time also reports facial numbness was getting better. Left facial droop resolved.

## 2021-04-03 NOTE — Progress Notes (Signed)
Nurse noticed patient seemed to have a slight stutter during conversation, asked patient if that was normal for him, wife stated she has only noticed it since the patient went for an MRI earlier today and believes it may be from the sedating medication they gave him for the procedure. Patient says he feels like he's "just having a little trouble finding the words". Possibly experiencing some mild aphasia, but patient passes aphasia portion of NIH assessment.

## 2021-04-03 NOTE — ED Notes (Addendum)
Dr. Erlinda Hong came and assessed pt. Physical Therapy is currently working with pt while awaiting MRI. Lorazepam ordered.

## 2021-04-03 NOTE — Progress Notes (Addendum)
STROKE TEAM PROGRESS NOTE   INTERVAL HISTORY His wife  is at the bedside.  Called to patient's ED room as RN was concerned that patient may be having a stroke, due to reemergence of his symptoms; however NIHSS was 0. Upon arrival patient was resting comfortable in a chair in his room with his wife sitting next to him. NIHSS remained 0; however patient reported some numbness of the left side of the lip and his left thumb. Patient abruptly had to have a BM and walked with assistance from clinical team and used to the wall to get to the bathroom. Patient noted that his left leg did feel a bit more numb, but he only noticed when he got up to walk.  It was difficult to gather from patient and RN patient's LKW as patient reported that he had the same symptoms multiple times in the past 24 hours but they would resolve for hours at a time. Patient never had an NIHSS > 0 when assessed by staff throughout his time in the ED. Patient reported that he otherwise felt well. Patient and wife were informed that patient did have 70-80% stenosis of the R proximal internal carotid artery.  Patient's wife mentioned that the patient's gait has become shorter over time and she was worried that this could be a sign of decline in brain function.   Vitals:   04/03/21 0900 04/03/21 0915 04/03/21 0930 04/03/21 1100  BP: (!) 115/92 (!) 146/88 (!) 159/76 (!) 173/80  Pulse: 78 81 80 68  Resp: (!) 23 (!) 27 18 18   Temp:    98.9 F (37.2 C)  TempSrc:    Oral  SpO2: 96% 93% 96% 95%   CBC:  Recent Labs  Lab 04/03/21 0515  WBC 6.2  NEUTROABS 4.1  HGB 16.0  HCT 46.6  MCV 101.3*  PLT 448   Basic Metabolic Panel:  Recent Labs  Lab 04/03/21 0515  NA 137  K 3.5  CL 98  CO2 29  GLUCOSE 190*  BUN 14  CREATININE 1.25*  CALCIUM 8.7*   Lipid Panel: No results for input(s): CHOL, TRIG, HDL, CHOLHDL, VLDL, LDLCALC in the last 168 hours. HgbA1c: No results for input(s): HGBA1C in the last 168 hours. Urine Drug Screen:   Recent Labs  Lab 04/03/21 0515  LABOPIA NONE DETECTED  COCAINSCRNUR NONE DETECTED  LABBENZ NONE DETECTED  AMPHETMU NONE DETECTED  THCU NONE DETECTED  LABBARB NONE DETECTED    Alcohol Level  Recent Labs  Lab 04/03/21 0515  ETH <10    IMAGING past 24 hours CT ANGIO HEAD NECK W WO CM (CODE STROKE)  Result Date: 04/03/2021 CLINICAL DATA:  Stroke follow-up. EXAM: CT ANGIOGRAPHY HEAD AND NECK TECHNIQUE: Multidetector CT imaging of the head and neck was performed using the standard protocol during bolus administration of intravenous contrast. Multiplanar CT image reconstructions and MIPs were obtained to evaluate the vascular anatomy. Carotid stenosis measurements (when applicable) are obtained utilizing NASCET criteria, using the distal internal carotid diameter as the denominator. CONTRAST:  75mL OMNIPAQUE IOHEXOL 350 MG/ML SOLN COMPARISON:  Same day head CT.  MRA June 04, 2009. FINDINGS: CTA NECK FINDINGS Aortic arch: Great vessel origins are patent. Atherosclerosis of the aorta and branch vessels. Right carotid system: Atherosclerosis of the common carotid artery and at the carotid bifurcation without greater than 50% stenosis. Left carotid system: Mixed calcific and noncalcific atherosclerosis at the carotid bifurcation and involving the proximal ICA with approximately 70-80% stenosis of the proximal ICA.  Vertebral arteries: Left dominant. Approximately 50% stenosis of the right vertebral artery origin secondary to atherosclerosis. Mild narrowing of the left vertebral artery origin secondary to predominately calcific atherosclerosis. Remainder of the vertebral arteries are patent bilaterally. z Skeleton: Mild multilevel degenerative disc disease. Other neck: No mass or suspicious adenopathy. Upper chest: Visualized lung apices are clear. Partially imaged median sternotomy. Review of the MIP images confirms the above findings CTA HEAD FINDINGS Anterior circulation: Bilateral calcific atherosclerosis  of the cavernous and paraclinoid ICAs with approximately 60% stenosis of the left cavernous ICA. No evidence of greater than 50% stenosis on the right. Bilateral MCA and ACAs are patent without evidence of proximal hemodynamically significant stenosis. 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 Trifurcation of the ACA. No aneurysm identified. Posterior circulation: Bilateral intradural vertebral arteries and basilar artery are patent with mild narrowing of the right vertebral artery at its dural margin. Left fetal type PCA. Bilateral PCAs are patent without evidence of proximal hemodynamically significant stenosis. No aneurysm identified Venous sinuses: As permitted by contrast timing, patent. Anatomic variants: As detailed above Review of the MIP images confirms the above findings IMPRESSION: CTA Head: 1. No emergent large vessel occlusion. 2. 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 CTA Neck: 1. Approximately 70-80% stenosis of the proximal internal carotid artery just distal to the carotid bifurcation. 2. Approximately 50% stenosis of the right vertebral artery origin. Electronically Signed   By: Margaretha Sheffield MD   On: 04/03/2021 08:25   CT HEAD WO CONTRAST  Result Date: 04/03/2021 CLINICAL DATA:  80 year old male with numbness tingling, paresthesia. EXAM: CT HEAD WITHOUT CONTRAST TECHNIQUE: Contiguous axial images were obtained from the base of the skull through the vertex without intravenous contrast. COMPARISON:  Brain MRI 06/04/2009.  Head CT 06/03/2009. FINDINGS: Brain: No midline shift, mass effect, or evidence of intracranial mass lesion. No ventriculomegaly. No acute intracranial hemorrhage identified. Patchy and confluent hypodensity in the bilateral cerebral white matter, deep gray matter nuclei. This is mildly progressed since 2010. No cortically based acute infarct identified. No cortical encephalomalacia identified.  Vascular: Calcified atherosclerosis at the skull base. No suspicious intracranial vascular hyperdensity. Skull: Stable.  No acute osseous abnormality identified. Sinuses/Orbits: Visualized paranasal sinuses and mastoids are stable and well pneumatized. Other: Tiny right forehead scalp lipoma (benign series 4, image 37). Postoperative changes to both globes. No acute orbit or scalp soft tissue finding. IMPRESSION: 1. No acute intracranial abnormality identified. 2. Advanced chronic small vessel disease. Mild progression since 2010. Electronically Signed   By: Genevie Ann M.D.   On: 04/03/2021 06:02   MR BRAIN WO CONTRAST  Result Date: 04/03/2021 CLINICAL DATA:  TIA. Left facial numbness and left leg weakness, now resolved. EXAM: MRI HEAD WITHOUT CONTRAST TECHNIQUE: Multiplanar, multiecho pulse sequences of the brain and surrounding structures were obtained without intravenous contrast. COMPARISON:  Head CT and CTA 04/03/2021.  Head MRI 06/04/2009. FINDINGS: The study is mildly motion degraded. Brain: There is an 8 mm focus of mild trace diffusion weighted signal hyperintensity laterally in the right thalamus with the suggestion of subtly reduced ADC (most conspicuous on the coronal ADC map) likely reflecting an acute infarct with this clinical history. Patchy to confluent T2 hyperintensities in the cerebral white matter bilaterally have mildly progressed from the prior MRI and are nonspecific but compatible with moderately severe chronic small vessel ischemic disease. Chronic lacunar infarcts are again noted in the deep cerebral white matter  bilaterally, right basal ganglia, left thalamus, and pons. There is mild generalized cerebral atrophy. No intracranial hemorrhage, mass, midline shift, or extra-axial fluid collection is identified. Vascular: Major intracranial vascular flow voids are preserved. Skull and upper cervical spine: Unremarkable bone marrow signal. Sinuses/Orbits: Unremarkable orbits. Trace right  mastoid effusion. Clear paranasal sinuses. Other: Small right frontal scalp lipoma. IMPRESSION: 1. Suspected small acute right thalamic infarct. 2. 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 Electronically Signed   By: Logan Bores M.D.   On: 04/03/2021 11:02    PHYSICAL EXAM Constitutional: Appears well-developed and well-nourished.  Psych: Affect appropriate to situation Eyes: No scleral injection HENT: No OP obstruction MSK: no joint deformities.  Cardiovascular: Normal rate and regular rhythm.  Respiratory: Effort normal, non-labored breathing GI: Soft.  No distension.  Skin: WDI  Neuro: Mental Status: Patient is awake, alert, oriented to person, place, month, year, and situation. Patient is able to give a clear and coherent history. No signs of aphasia or neglect Cranial Nerves: II: Visual Fields are full. Pupils are equal, round, and reactive to light.   III,IV, VI: EOMI without ptosis or diploplia.  V: Facial sensation is symmetric to temperature VII: Facial movement is symmetric.  VIII: hearing is intact to voice X: Uvula elevates symmetrically XI: Shoulder shrug is symmetric. XII: tongue is midline without atrophy or fasciculations.  Motor: Tone is normal. Bulk is normal. 4+/5 strength was present in all four extremities.  Sensory: Sensation is symmetric to light touch and temperature in the arms and legs.  Cerebellar: FNF  intact bilaterally Gait: Short and small. Patient walks with a shuffle with toes pointed outward and relies on the wall and personal assistance. Patient can lift his feet off the ground and stop his shuffle if actively reminded.  ASSESSMENT/PLAN [name] is a 80 y.o. male with history of a history of hyperlipidemia, hypertension, recently diagnosed diabetes who presented with left-sided numbness and weakness most consistent with transient ischemic attack. However on MRI, patient was  noted to have a possible acute small right thalamic infarct. An infarct in this area could explain patient's symptoms. At this time etiology is likely small vessel occlusion. Patient was also noted to have multiple chronic lacunar infarcts.    Stroke - small R thalamus infarct, likely small vessel disease  CT head No acute abnormality.  Noted small vessel disease w/ mild progression since 2010.   CTA head & neck left ICA 70 to 80% stenosis  MRI: acute small right thalamic infarct, with multiple chronic lacunar infacrts  2D Echo EF 60 to 65%  LDL 57  HgbA1c 6.4  VTE prophylaxis - Lovenox  aspirin 81 mg daily prior to admission, now on aspirin 81 mg daily and Plavix 75mg .  Continue DAPT for 3 weeks and then Plavix alone.  Therapy recommendations:  PT/OT pending  Disposition: Pending  Carotid stenosis  Status post right CEA  CTA neck showed left ICA 70 to 80% stenosis  Asymptomatic left ICA stenosis  Follow-up with vascular surgery as outpatient  Hypertension  Home meds:  Cozaar 50mg  daily  Permissive hypertension (OK if <220/120) for 24-48 hours post stroke and then gradually normalized within 5-7 days.  stable . Long-term BP goal normotensive  Hyperlipidemia  Home meds:  Lipitor 10mg    LDL 57, goal < 70  On Lipitor 40  Continue statin at discharge  Other Stroke Risk Factors  Advanced Age >/= 65   Chewing tobacco use, advised  to discontinue  Obesity,  BMI >/= 30 associated with increased stroke risk, recommend weight loss, diet and exercise as appropriate   Hx of TIA  Coronary artery disease status post CABG [facility] day # 0  [name] Dunnings, MD PGY-1  ATTENDING NOTE: I reviewed above note and agree with the assessment and plan. Pt was seen and examined.   80 year old male with history of CAD status post CABG in 2005, hypertension, hyperlipidemia, TIA presented to ED for left-sided numbness with left leg weakness.  Resolved on presentation.   CT no acute abnormality, CTA head and neck showed right VA origin stenosis, left ICA 70 to 80% stenosis.  We were called at this morning emergently due to recurrent left-sided numbness and left leg weakness along with slurred speech and left upper extremity ataxia.  However, exact time onset or last seen normal was not clear.  Patient stated that he had on and off left-sided numbness overnight, RN found patient has slurred speech and left upper extremity ataxia after shift change.  On exam, patient awake alert, orientated x3, no aphasia, speech fluent, able to name and repeat, slight dysarthria.  No gaze palsy, visual field full, slight right nasolabial fold flattening.  Left upper and lower extremity no drift, equal strengths, sensation symmetrical to light touch although patient felt left corner of her mouth, left thumb and left lateral leg tingling sensation.  Finger-to-nose and heel-to-shin no ataxia.  However, walking patient mild imbalance gait.  Patient wife stated that for the last several months, patient walking at home with broad-based gait and during the shopping, he needs shopping cart.  Not sure his baseline of gait.  NIH score at that time 2.  Patient not a TPA candidate given unclear Time onset, none disabling symptoms.  Subsequent MRI showed right small thalamic infarct.  EF 60 to 65%.  LDL 57 and A1c 6.4.  UDS negative.  Patient etiology for stroke likely due to small vessel disease.  Recommend aspirin 81 and Plavix 75 and Lipitor 40 for stroke prevention.  Continue PT/OT.  Patient has asymptomatic left ICA stenosis, recommend outpatient follow-up with [facility].  For detailed assessment and plan, please refer to above as I have made changes wherever appropriate.   [name] Hawking, MD PhD Stroke Neurology 04/03/2021 4:31 PM  Patient developed recurrent symptoms in the ER, had emergent evaluation as above.  I spent  35 minutes in total face-to-face time with the patient, more than 50% of which was  spent in counseling and coordination of care, reviewing test results, images and medication, and discussing the diagnosis, treatment plan and potential prognosis. This patient's care requiresreview of multiple databases, neurological assessment, discussion with family, other specialists and medical decision making of high complexity.  I discussed with Dr. Lorin Mercy.       To contact Stroke Continuity provider, please refer to [email or web address] After hours, contact General Neurology

## 2021-04-03 NOTE — ED Provider Notes (Signed)
[facility] EMERGENCY DEPARTMENT Provider Note   CSN: [id number] Arrival date & time: 04/03/21  0419     History Chief Complaint - numbness   [name] [name] is a 80 y.o. male.  The history is provided by the patient and the spouse.  Neurologic Problem This is a new problem. The current episode started 3 to 5 hours ago. The problem occurs constantly. The problem has been resolved. Pertinent negatives include no chest pain, no abdominal pain, no headaches and no shortness of breath. Nothing aggravates the symptoms. Nothing relieves the symptoms. He has tried nothing for the symptoms.   Patient with history of CAD, hypertension, hyperlipidemia presents with left-sided numbness and weakness.  Patient reports he went to bed around 2200 on April 3.  He reports he woke up around 2 AM to go to the restroom when he noted that he had numbness on his left lip, numbness in his left hand, and numbness in his left leg.  When he stood up to walk he felt that his leg was weak.  No falls reported.  He reports his symptoms are now improving.  No headache or visual changes.  No speech difficulty He reports in 2010 he had a similar episode    Past Medical History:  Diagnosis Date  . Basal cell carcinoma 11/09/2019   nod & infil-behind right ear-cx3 &exc  . Basal cell carcinoma 03/21/2020   Residual BCC with peripheral margin involved - ST recommends MOHs  . Carotid artery occlusion   . Cerebrovascular disease   . Coronary artery disease    s/p CABG 2005; sees Dr Johnsie Cancel yearly  . Gynecomastia   . 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 Hypercholesterolemia   . Hypertension   . Keloid   . Neurodermatitis   . Overweight(278.02)   . Personal history of colonic polyps 10/08/2006   tubular adenomas  . Renal insufficiency   . Shoulder pain   . Transient ischemic attack 2006   "lasted ~ 5 seconds"  . Vitamin D deficiency     Patient Active Problem List   Diagnosis Date Noted  . Lower extremity  edema 08/13/2019  . Unsteady gait 06/09/2019  . Preventative health care 06/09/2019  . Hyperlipidemia 01/30/2016  . Carotid stenosis 08/26/2015  . 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 Carotid artery disease ([place]) 07/01/2012  . Special screening for malignant neoplasm of prostate 05/08/2011  . Vitamin D deficiency 11/20/2009  . Transient cerebral ischemia 06/30/2009  . CEREBROVASCULAR DISEASE 06/30/2009  . COLONIC POLYPS 01/10/2008  . Essential hypertension 01/10/2008  . Coronary atherosclerosis 01/10/2008  . 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 GYNECOMASTIA, UNILATERAL 01/10/2008  . 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 SHOULDER PAIN 01/10/2008  . Type 2 diabetes mellitus ([place]) 01/10/2008    Past Surgical History:  Procedure Laterality Date  . CARDIAC CATHETERIZATION  02/2004   "tried to stent; couldn't"  . CAROTID ENDARTERECTOMY Right 08/26/2015  . CORONARY ANGIOPLASTY    . CORONARY ARTERY BYPASS GRAFT  Feb. 2005   4 vessel  . ENDARTERECTOMY Right 08/26/2015   Procedure: Right Carotid ENDARTERECTOMY with Patch Angioplasty ;  Surgeon: Rosetta Posner, MD;  Location: [place];  Service: Vascular;  Laterality: Right;  . 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 Dessie Coma  . KELOID EXCISION    . PILONIDAL CYST EXCISION  1989       Family History  Problem Relation Age of Onset  . Heart disease Mother  Before age 36  . Diabetes Mother   . Kidney disease Mother   . Heart attack Mother 26  . Lung cancer Father 68  . Diabetes Brother   . Heart disease Brother   . Heart disease Brother   . Arthritis Brother   . Diabetes Sister   . Fibromyalgia Sister   . Lung cancer Paternal Uncle        questionable as to if it was lung ca  . Healthy Daughter   . Colon cancer Neg Hx     Social History   Tobacco Use  . Smoking status: Former Research scientist (life sciences)  . Smokeless tobacco: Former Systems developer    Types: Snuff  . Tobacco comment: uses dip 3-4 times per week  Vaping Use  .  Vaping Use: Never used  Substance Use Topics  . Alcohol use: No    Alcohol/week: 0.0 standard drinks  . Drug use: No    Home Medications Prior to Admission medications   Medication Sig Start Date End Date Taking? Authorizing Provider  aspirin EC 81 MG tablet Take 1 tablet (81 mg total) by mouth daily. 04/22/20   Josue Hector, MD  atorvastatin (LIPITOR) 20 MG tablet TAKE 1/2 TABLET BY MOUTH EVERY DAY 07/29/20   Pleas Koch, NP  blood glucose meter kit and supplies KIT Dispense based on patient and insurance preference. Use up to four times daily as directed. (FOR ICD-9 250.00, 250.01). 06/10/20   Pleas Koch, NP  Cholecalciferol (VITAMIN D) 2000 UNITS CAPS Take 1 capsule by mouth daily.    [provider]  hydrochlorothiazide (MICROZIDE) 12.5 MG capsule Take 1 capsule (12.5 mg total) by mouth daily. Please make yearly appt with Dr. Johnsie Cancel for April 2022 for future refills. Thank you 1st attempt 01/20/21   Josue Hector, MD  losartan (COZAAR) 25 MG tablet TAKE 2 TABLETS BY MOUTH EVERY DAY 10/24/20   Josue Hector, MD  nitroGLYCERIN (NITROSTAT) 0.4 MG SL tablet Place 1 tablet (0.4 mg total) under the tongue every 5 (five) minutes as needed for chest pain. 02/22/16   Josue Hector, MD  vitamin B-12 (CYANOCOBALAMIN) 1000 MCG tablet Take 1,000 mcg by mouth daily.    [provider]    Allergies    Niacin  Review of Systems   Review of Systems  Constitutional: Negative for fever.  Eyes: Negative for visual disturbance.  Respiratory: Negative for shortness of breath.   Cardiovascular: Negative for chest pain.  Gastrointestinal: Negative for abdominal pain.  Neurological: Positive for weakness and numbness. Negative for speech difficulty and headaches.  All other systems reviewed and are negative.   Physical Exam Updated Vital Signs BP (!) 183/87   Pulse 80   Temp 98.3 F (36.8 C) (Oral)   Resp 20   SpO2 95%   Physical Exam CONSTITUTIONAL:  Elderly, no acute distress HEAD: Normocephalic/atraumatic EYES: EOMI/PERRL, no nystagmus, no visual field deficit  no ptosis ENMT: Mucous membranes moist NECK: supple no meningeal signs, CEA scar noted on right neck CV: S1/S2 noted, no murmurs/rubs/gallops noted LUNGS: Lungs are clear to auscultation bilaterally, no apparent distress ABDOMEN: soft, nontender, no rebound or guarding GU:no cva tenderness NEURO:Awake/alert, face symmetric, no arm or leg drift is noted Equal 5/5 strength with shoulder abduction, elbow flex/extension, wrist flex/extension in upper extremities and equal hand grips bilaterally Equal 5/5 strength with hip flexion,knee flex/extension, foot dorsi/plantar flexion Cranial nerves 3/4/5/6/07/08/09/11/12 tested and intact Sensation to light touch intact in all extremities EXTREMITIES: pulses  normal, full ROM SKIN: warm, color normal PSYCH: no abnormalities of mood noted  ED Results / Procedures / Treatments   Labs (all labs ordered are listed, but only abnormal results are displayed) Labs Reviewed  CBC - Abnormal; Notable for the following components:      Result Value   MCV 101.3 (*)    MCH 34.8 (*)    All other components within normal limits  DIFFERENTIAL - Abnormal; Notable for the following components:   Abs Immature Granulocytes 0.08 (*)    All other components within normal limits  COMPREHENSIVE METABOLIC PANEL - Abnormal; Notable for the following components:   Glucose, Bld 190 (*)    Creatinine, Ser 1.25 (*)    Calcium 8.7 (*)    Total Protein 6.2 (*)    Albumin 3.3 (*)    GFR, Estimated 59 (*)    All other components within normal limits  URINALYSIS, ROUTINE W REFLEX MICROSCOPIC - Abnormal; Notable for the following components:   Color, Urine STRAW (*)    Specific Gravity, Urine 1.004 (*)    All other components within normal limits  RESP PANEL BY RT-PCR (FLU A&B, COVID) ARPGX2  ETHANOL  PROTIME-INR  APTT  RAPID URINE DRUG SCREEN, HOSP  PERFORMED    EKG EKG Interpretation  Date/Time:  Monday April 03 2021 04:26:38 EDT Ventricular Rate:  85 PR Interval:  168 QRS Duration: 96 QT Interval:  354 QTC Calculation: 421 R Axis:   78 Text Interpretation: Normal sinus rhythm Nonspecific ST and T wave abnormality Abnormal ECG Confirmed by Ripley Fraise [phone]) on 04/03/2021 5:14:33 AM   Radiology CT HEAD WO CONTRAST  Result Date: 04/03/2021 CLINICAL DATA:  80 year old male with numbness tingling, paresthesia. EXAM: CT HEAD WITHOUT CONTRAST TECHNIQUE: Contiguous axial images were obtained from the base of the skull through the vertex without intravenous contrast. COMPARISON:  Brain MRI 06/04/2009.  Head CT 06/03/2009. FINDINGS: Brain: No midline shift, mass effect, or evidence of intracranial mass lesion. No ventriculomegaly. No acute intracranial hemorrhage identified. Patchy and confluent hypodensity in the bilateral cerebral white matter, deep gray matter nuclei. This is mildly progressed since 2010. No cortically based acute infarct identified. No cortical encephalomalacia identified. Vascular: Calcified atherosclerosis at the skull base. No suspicious intracranial vascular hyperdensity. Skull: Stable.  No acute osseous abnormality identified. Sinuses/Orbits: Visualized paranasal sinuses and mastoids are stable and well pneumatized. Other: Tiny right forehead scalp lipoma (benign series 4, image 37). Postoperative changes to both globes. No acute orbit or scalp soft tissue finding. IMPRESSION: 1. No acute intracranial abnormality identified. 2. Advanced chronic small vessel disease. Mild progression since 2010. Electronically Signed   By: Genevie Ann M.D.   On: 04/03/2021 06:02    Procedures Procedures   Medications Ordered in ED Medications - No data to display  ED Course  I have reviewed the triage vital signs and the nursing notes.  Pertinent labs & imaging results that were available during my care of the patient were  reviewed by me and considered in my medical decision making (see chart for details).    MDM Rules/Calculators/A&P                          5:19 AM Patient last known well approximately 10 PM on April 3. He woke up around 2 AM and noted left-sided numbness that is now improving On my exam he has no focal neuro deficits.  Patient is high risk for stroke.  Patient  had previous right carotid endarterectomy 2016. Carotid duplex from October 2021.  No carotid stenosis on the right.  40-59% stenosis in the left ICA CT imaging is pending at this time tPA in stroke considered but not given due to: Onset over 3-4.5hours (LKW 2200 on 04/02/2021) Symptoms resolved  6:53 AM Discussed with Dr. Kirkpatrick with neurology.  He recommends admission for TIA Discussed with Dr. Gardner for admission Patient stable no new complaints.  No new weaknesses reported Final Clinical Impression(s) / ED Diagnoses Final diagnoses:  TIA (transient ischemic attack)    Rx / DC Orders ED Discharge Orders    None       , , MD 04/03/21 0653  

## 2021-04-03 NOTE — Evaluation (Signed)
Physical Therapy Evaluation Patient Details Name: [name] [name] MRN: [id number] DOB: 05-Feb-1941 Today's Date: 04/03/2021   History of Present Illness  Pt is a 80 y/o male admitted 4/4 secondary to numbness in LLE, L cheek and L fingers. CT negative and pt awaiting MRI. PMH includes HTN, CAD s/p CABG, and DM.  Clinical Impression  Pt admitted secondary to problem above with deficits below. Pt initially with no complaints, however, during middle of gait, noted increased LLE weakness and pt reporting numbness. Noted decreased balance and requiring up to mod A for stability. Neuro MD present to evaluate. Pt reporting symptoms resolved upon return to supine. Pt reporting he is normally very independent. Wife can assist as needed. Given fluctuating symptoms, will need to further evaluate during next session to determine most appropriate d/c recommendations. If symptoms improve will likely be able to d/c home with assist from family, however, if symptoms continue, may need to consider post acute rehab. Will continue to follow acutely.     Follow Up Recommendations Other (comment) (TBD pending mobility progression)    Equipment Recommendations  Other (comment) (TBD)    Recommendations for Other Services       Precautions / Restrictions Precautions Precautions: Fall Restrictions Weight Bearing Restrictions: No      Mobility  Bed Mobility Overal bed mobility: Needs Assistance Bed Mobility: Sit to Supine       Sit to supine: Min assist   General bed mobility comments: Min A for LLE assist to return to supine.    Transfers Overall transfer level: Needs assistance Equipment used: None Transfers: Sit to/from Stand Sit to Stand: Min assist         General transfer comment: Increased sway in standing.  Ambulation/Gait Ambulation/Gait assistance: Min assist;Mod assist Gait Distance (Feet): 50 Feet Assistive device: None Gait Pattern/deviations: Step-through pattern;Decreased  weight shift to left;Staggering left Gait velocity: Decreased   General Gait Details: Pt with multiple LOB during gait. Decreased step height in LLE and noted foot drag in LLE. Required cues for increased step height. Noted buckling at knees as well. Initially requiring min A, but then required mod A for remainder of gait. Neuro MD present to evaluate.  Stairs            Wheelchair Mobility    Modified Rankin (Stroke Patients Only) Modified Rankin (Stroke Patients Only) Pre-Morbid Rankin Score: No symptoms Modified Rankin: Moderately severe disability     Balance Overall balance assessment: Needs assistance Sitting-balance support: No upper extremity supported;Feet supported Sitting balance-Leahy Scale: Good     Standing balance support: No upper extremity supported;During functional activity Standing balance-Leahy Scale: Poor Standing balance comment: Reliant on external support.                             Pertinent Vitals/Pain Pain Assessment: No/denies pain    Home Living Family/patient expects to be discharged to:: Private residence Living Arrangements: Spouse/significant other Available Help at Discharge: Family Type of Home: House Home Access: Stairs to enter Entrance Stairs-Rails: None Entrance Stairs-Number of Steps: 3 Home Layout: One level Home Equipment: Cane - single point;Walker - 2 wheels;Shower seat      Prior Function Level of Independence: Independent with assistive device(s)         Comments: Still driving. USed cane occasionally     Hand Dominance        Extremity/Trunk Assessment   Upper Extremity Assessment Upper Extremity Assessment: Defer to OT  evaluation;LUE deficits/detail LUE Deficits / Details: Reporting numbness in L pointer and thumb. Noted difficulty using LUE when trying to buckle his belt. Would lose grip easily.    Lower Extremity Assessment Lower Extremity Assessment: LLE deficits/detail LLE Deficits /  Details: Grossly 4/5 with MMT, however, with functional mobility, noted buckling and decreased hip flexion when taking a step. Reported LLE felt heavy during gait, but upon return to supine, reports symptoms resolved.    Cervical / Trunk Assessment Cervical / Trunk Assessment: Kyphotic  Communication   Communication: No difficulties  Cognition Arousal/Alertness: Awake/alert Behavior During Therapy: WFL for tasks assessed/performed Overall Cognitive Status: Impaired/Different from baseline Area of Impairment: Safety/judgement                         Safety/Judgement: Decreased awareness of deficits;Decreased awareness of safety     General Comments: Pt with decreased awareness of deficits, especially LLE deficits during gait.      General Comments General comments (skin integrity, edema, etc.): Pt's wife present during session    Exercises     Assessment/Plan    PT Assessment Patient needs continued PT services  PT Problem List Decreased strength;Decreased activity tolerance;Decreased balance;Decreased mobility;Decreased coordination;Decreased cognition;Decreased knowledge of use of DME;Decreased safety awareness;Decreased knowledge of precautions;Impaired sensation       PT Treatment Interventions DME instruction;Gait training;Stair training;Functional mobility training;Therapeutic activities;Therapeutic exercise;Balance training;Patient/family education    PT Goals (Current goals can be found in the Care Plan section)  Acute Rehab PT Goals Patient Stated Goal: to feel better PT Goal Formulation: With patient Time For Goal Achievement: 04/17/21 Potential to Achieve Goals: Good    Frequency Min 4X/week   Barriers to discharge        Co-evaluation               AM-PAC PT "6 Clicks" Mobility  Outcome Measure Help needed turning from your back to your side while in a flat bed without using bedrails?: A Little Help needed moving from lying on your back  to sitting on the side of a flat bed without using bedrails?: A Little Help needed moving to and from a bed to a chair (including a wheelchair)?: A Lot Help needed standing up from a chair using your arms (e.g., wheelchair or bedside chair)?: A Little Help needed to walk in hospital room?: A Lot Help needed climbing 3-5 steps with a railing? : A Lot 6 Click Score: 15    End of Session   Activity Tolerance: Patient tolerated treatment well Patient left: in bed;with call bell/phone within reach;with family/visitor present;with nursing/sitter in room (on stretcher in ED) Nurse Communication: Mobility status PT Visit Diagnosis: Unsteadiness on feet (R26.81);Muscle weakness (generalized) (M62.81);Difficulty in walking, not elsewhere classified (R26.2)    Time: 1062-6948 PT Time Calculation (min) (ACUTE ONLY): 19 min   Charges:   PT Evaluation $PT Eval Moderate Complexity: 1 Mod          Reuel Derby, PT, DPT  Acute Rehabilitation Services  Pager: [phone] Office: [phone]   Rudean Hitt 04/03/2021, 10:01 AM

## 2021-04-04 DIAGNOSIS — G459 Transient cerebral ischemic attack, unspecified: Secondary | ICD-10-CM | POA: Diagnosis not present

## 2021-04-04 DIAGNOSIS — I63331 Cerebral infarction due to thrombosis of right posterior cerebral artery: Secondary | ICD-10-CM

## 2021-04-04 LAB — COMPREHENSIVE METABOLIC PANEL
ALT: 22 U/L (ref 0–44)
AST: 22 U/L (ref 15–41)
Albumin: 3.1 g/dL — ABNORMAL LOW (ref 3.5–5.0)
Alkaline Phosphatase: 64 U/L (ref 38–126)
Anion gap: 6 (ref 5–15)
BUN: 10 mg/dL (ref 8–23)
CO2: 28 mmol/L (ref 22–32)
Calcium: 8.2 mg/dL — ABNORMAL LOW (ref 8.9–10.3)
Chloride: 104 mmol/L (ref 98–111)
Creatinine, Ser: 1.1 mg/dL (ref 0.61–1.24)
GFR, Estimated: >60 mL/min (ref >60)
Glucose, Bld: 119 mg/dL — ABNORMAL HIGH (ref 70–99)
Potassium: 3.7 mmol/L (ref 3.5–5.1)
Sodium: 138 mmol/L (ref 135–145)
Total Bilirubin: 1.4 mg/dL — ABNORMAL HIGH (ref 0.3–1.2)
Total Protein: 5.8 g/dL — ABNORMAL LOW (ref 6.5–8.1)

## 2021-04-04 LAB — CBC WITH DIFFERENTIAL/PLATELET
Abs Immature Granulocytes: 0.06 10*3/uL (ref 0.00–0.07)
Basophils Absolute: 0 10*3/uL (ref 0.0–0.1)
Basophils Relative: 0 %
Eosinophils Absolute: 0.2 10*3/uL (ref 0.0–0.5)
Eosinophils Relative: 2 %
HCT: 45 % (ref 39.0–52.0)
Hemoglobin: 15.8 g/dL (ref 13.0–17.0)
Immature Granulocytes: 1 %
Lymphocytes Relative: 18 %
Lymphs Abs: 1.2 10*3/uL (ref 0.7–4.0)
MCH: 35.8 pg — ABNORMAL HIGH (ref 26.0–34.0)
MCHC: 35.1 g/dL (ref 30.0–36.0)
MCV: 102 fL — ABNORMAL HIGH (ref 80.0–100.0)
Monocytes Absolute: 0.7 10*3/uL (ref 0.1–1.0)
Monocytes Relative: 10 %
Neutro Abs: 4.6 10*3/uL (ref 1.7–7.7)
Neutrophils Relative %: 69 %
Platelets: 216 10*3/uL (ref 150–400)
RBC: 4.41 MIL/uL (ref 4.22–5.81)
RDW: 12.9 % (ref 11.5–15.5)
WBC: 6.7 10*3/uL (ref 4.0–10.5)
nRBC: 0 % (ref 0.0–0.2)

## 2021-04-04 MED ORDER — CARVEDILOL 3.125 MG PO TABS: 3.1250 mg | ORAL_TABLET | Freq: Two times a day (BID) | ORAL | Status: DC

## 2021-04-04 MED ORDER — GLUCERNA SHAKE PO LIQD
237.0000 mL | Freq: Two times a day (BID) | ORAL | Status: DC
  Administered 2021-04-05 – 2021-04-06 (×3): 237 mL via ORAL
  Filled 2021-04-04 (×4): qty 237

## 2021-04-04 MED ORDER — CLOPIDOGREL BISULFATE 75 MG PO TABS: 75.0000 mg | ORAL_TABLET | Freq: Every day | ORAL | Status: DC

## 2021-04-04 MED ORDER — CARVEDILOL 3.125 MG PO TABS
3.1250 mg | ORAL_TABLET | Freq: Two times a day (BID) | ORAL | Status: DC
  Administered 2021-04-04 – 2021-04-06 (×4): 3.125 mg via ORAL
  Filled 2021-04-04 (×4): qty 1

## 2021-04-04 MED ORDER — ATORVASTATIN CALCIUM 40 MG PO TABS: 40.0000 mg | ORAL_TABLET | Freq: Every day | ORAL | Status: DC

## 2021-04-04 MED ORDER — ACETAMINOPHEN 325 MG PO TABS: 650.0000 mg | ORAL_TABLET | ORAL | Status: DC | PRN

## 2021-04-04 NOTE — Progress Notes (Signed)
Rehab Admissions Coordinator Note:  Patient was screened by Cleatrice Burke for appropriateness for an Inpatient Acute Rehab Consult per OT recs. .  At this time, we are recommending Inpatient Rehab consult. I will place order per protocol.  Cleatrice Burke RN MSN 04/04/2021, 12:08 PM  I can be reached at [phone].

## 2021-04-04 NOTE — Evaluation (Signed)
Physical Therapy Evaluation Patient Details Name: [name] [name] MRN: [id number] DOB: 04/18/1941 Today's Date: 04/04/2021   History of Present Illness  Pt is a 80 y/o male admitted 4/4 secondary to numbness in LLE, L cheek and L fingers. CT negative and MRI reveals small acute R thalamic infarct.[name] PMH includes HTN, CAD s/p CABG, and DM.  Clinical Impression   Continuing work on functional mobility and activity tolerance;  Session focused on comprehensive assessment of balance with the berg Balance Assessment; pt scored a 27/54 on the test -- this puts him at a near-100% chance of falling within the next 12 months; He is participating well in PT an [facility] today, and has reliable assistance at home; strongly recommend CIR for intensive post-acute rehab to maximize independence and safety with mobility, and allow for safe dc home; Discussed with OT and Dr. Verlon Au    04/04/21 1100  Standardized Balance Assessment  Standardized Balance Assessment  Berg Balance Test  Berg Balance Test  Sit to Stand 2  Standing Unsupported 3  Sitting with Back Unsupported but Feet Supported on Floor or Stool 4  Stand to Sit 3  Transfers 2  Standing Unsupported with Eyes Closed 2  Standing Ubsupported with Feet Together 1  From Standing, Reach Forward with Outstretched Arm 2  From Standing Position, Pick up Object from Floor 3  From Standing Position, Turn to Look Behind Over each Shoulder 2  Turn 360 Degrees 0  Standing Unsupported, Alternately Place Feet on Step/Stool 0  Standing Unsupported, One Foot in Front 2  Standing on One Leg 1  Total Score 27      Follow Up Recommendations CIR    Equipment Recommendations  Rolling walker with 5" wheels;3in1 (PT)    Recommendations for Other Services Rehab consult     Precautions / Restrictions Precautions Precautions: Fall Restrictions Weight Bearing Restrictions: No      Mobility  Bed Mobility Overal bed mobility: Needs Assistance Bed Mobility:  Sit to Supine       Sit to supine: Min guard   General bed mobility comments: recieved pt in recliner    Transfers Overall transfer level: Needs assistance Equipment used: None Transfers: Sit to/from Stand Sit to Stand: Min assist         General transfer comment: min assist to power up fully and steady, cueing for hand placement and safety; L lean without UE support  Ambulation/Gait Ambulation/Gait assistance: Mod assist Gait Distance (Feet):  (Simulated march in place in front of recliner) Assistive device: None Gait Pattern/deviations: Staggering left     General Gait Details: Heavy L drift and lean with tendency to lose balance; would fall without heavy mod assist  Stairs            Wheelchair Mobility    Modified Rankin (Stroke Patients Only) Modified Rankin (Stroke Patients Only) Pre-Morbid Rankin Score: No symptoms Modified Rankin: Moderately severe disability     Balance Overall balance assessment: Needs assistance Sitting-balance support: No upper extremity supported;Feet supported Sitting balance-Leahy Scale: Fair     Standing balance support: Bilateral upper extremity supported;No upper extremity supported;During functional activity Standing balance-Leahy Scale: Poor Standing balance comment: relies heavily on UE and external support                 Standardized Balance Assessment Standardized Balance Assessment : Berg Balance Test Berg Balance Test Sit to Stand: Able to stand using hands after several tries Standing Unsupported: Able to stand 2 minutes with supervision Sitting  with Back Unsupported but Feet Supported on Floor or Stool: Able to sit safely and securely 2 minutes Stand to Sit: Controls descent by using hands Transfers: Able to transfer with verbal cueing and /or supervision Standing Unsupported with Eyes Closed: Able to stand 3 seconds Standing Ubsupported with Feet Together: Needs help to attain position but able to  stand for 30 seconds with feet together From Standing, Reach Forward with Outstretched Arm: Can reach forward >5 cm safely (2") From Standing Position, Pick up Object from Floor: Able to pick up shoe, needs supervision From Standing Position, Turn to Look Behind Over each Shoulder: Turn sideways only but maintains balance Turn 360 Degrees: Needs assistance while turning Standing Unsupported, Alternately Place Feet on Step/Stool: Needs assistance to keep from falling or unable to try Standing Unsupported, One Foot in Front: Able to take small step independently and hold 30 seconds Standing on One Leg: Tries to lift leg/unable to hold 3 seconds but remains standing independently Total Score: 27         Pertinent Vitals/Pain Pain Assessment: No/denies pain    Home Living Family/patient expects to be discharged to:: Private residence Living Arrangements: Spouse/significant other Available Help at Discharge: Family Type of Home: House Home Access: Stairs to enter Entrance Stairs-Rails: None Entrance Stairs-Number of Steps: 3 Home Layout: One level Home Equipment: Cane - single point;Walker - 2 wheels;Shower seat      Prior Function Level of Independence: Independent with assistive device(s)         Comments: Still driving. USed cane occasionally     Hand Dominance   Dominant Hand: Left    Extremity/Trunk Assessment   Upper Extremity Assessment Upper Extremity Assessment: LUE deficits/detail LUE Deficits / Details: mild weakness and decreased coordination, reports numbness in ring finger today LUE Sensation: decreased light touch LUE Coordination: decreased fine motor;decreased gross motor    Lower Extremity Assessment Lower Extremity Assessment: Defer to PT evaluation       Communication   Communication: No difficulties  Cognition Arousal/Alertness: Awake/alert Behavior During Therapy: WFL for tasks assessed/performed Overall Cognitive Status: Impaired/Different  from baseline Area of Impairment: Safety/judgement;Awareness;Problem solving                         Safety/Judgement: Decreased awareness of safety;Decreased awareness of deficits Awareness: Emergent Problem Solving: Slow processing;Requires verbal cues;Difficulty sequencing General Comments: pt with decreased awareness to deficits and safety, cueing for problem solving; was clearer about his fall risk after undergoing berg Balance Assessment      General Comments General comments (skin integrity, edema, etc.): spouse and daughter present, educated on CIR recommendations; with mobility pt requires min-mod assist at times due to LOB towards L side    Exercises     Assessment/Plan    PT Assessment    PT Problem List         PT Treatment Interventions      PT Goals (Current goals can be found in the Care Plan section)  Acute Rehab PT Goals Patient Stated Goal: to get better PT Goal Formulation: With patient Time For Goal Achievement: 04/17/21 Potential to Achieve Goals: Good    Frequency Min 4X/week   Barriers to discharge        Co-evaluation               AM-PAC PT "6 Clicks" Mobility  Outcome Measure Help needed turning from your back to your side while in a flat bed without using bedrails?:  A Little Help needed moving from lying on your back to sitting on the side of a flat bed without using bedrails?: A Little Help needed moving to and from a bed to a chair (including a wheelchair)?: A Lot Help needed standing up from a chair using your arms (e.g., wheelchair or bedside chair)?: A Little Help needed to walk in hospital room?: A Lot Help needed climbing 3-5 steps with a railing? : A Lot 6 Click Score: 15    End of Session Equipment Utilized During Treatment: Gait belt Activity Tolerance: Patient tolerated treatment well Patient left: in chair;with call bell/phone within reach;with chair alarm set Nurse Communication: Mobility status (fall  risk) PT Visit Diagnosis: Unsteadiness on feet (R26.81);Muscle weakness (generalized) (M62.81);Difficulty in walking, not elsewhere classified (R26.2)    Time: 7741-2878 PT Time Calculation (min) (ACUTE ONLY): 42 min   Charges:     PT Treatments $Therapeutic Activity: 23-37 mins $Neuromuscular Re-education: 8-22 mins        Roney Marion, PT  Acute Rehabilitation Services Pager [phone] Office [place] 04/04/2021, 12:14 PM

## 2021-04-04 NOTE — PMR Pre-admission (Signed)
PMR Admission Coordinator Pre-Admission Assessment  Patient: [name] [name] is an 80 y.o., male MRN: [id number] DOB: 12/08/1941 Height:   Weight: 103.4 kg              Insurance Information HMO:     PPO:      PCP:      IPA:      80/20:      OTHER:  PRIMARY: [employer]     Policy#: [id number]      Subscriber: pt CM Name: Maudie Mercury      Phone#: [phone] ext [phone]     Fax#: [phone] Pre-Cert#: [id number] approved for 7 days f/u with Edwena Felty ext [phone] same fax      Employer:  Benefits:  Phone #:[phone]   Name:  Eff. Date: 01/01/2020     Deduct: none      Out of Pocket Max: $4000      Life Max: none  CIR: $160 co pay per day days 1 until 10      SNF: no copay days 1 until 20; $50 co pay per day days 21 until 100 Outpatient: $20 per visit     Co-Pay: visits per medical neccesity Home Health: 100%      Co-Pay: visits per medical neccesity DME: 80%     Co-Pay: 20% Providers: in network  SECONDARY: none      Policy#:       Phone#:   Development worker, community:       Phone#:   [employer] Engineer, [employer]" for patients in Inpatient Rehabilitation Facilities with attached "Privacy Act [place] Records" was provided and verbally reviewed with: Patient and Family  Emergency Contact Information Contact Information    Name Relation Home Work Mobile   Elizarraraz,[name] Spouse [phone]  [phone]   Terrence Dupont Daughter   [phone]     Current Medical History  Patient Admitting Diagnosis: CVA  History of Present Illness:  80 y.o. right-handed male with history of TIA, hypertension, hyperlipidemia, CAD with CABG 2005 maintained on aspirin, right CEA and questionable medical compliance.  Per chart review lives with spouse independent with assistive device.  1 level home 3 steps to entry.  Presented 04/03/2021 with acute onset left side weakness.  Cranial CT scan negative for acute changes.  CT angiogram of head and neck showed no emergent large vessel  occlusion.  Approximately 50% stenosis of the left cavernous internal carotid artery and approximately 70 to 80% stenosis of the proximal internal carotid artery just distal to the carotid bifurcation.  MRI of the brain showed small acute right thalamic infarction.  Echocardiogram with ejection fraction of 60 to 65% no wall motion abnormalities.  Admission chemistries unremarkable except glucose 190, creatinine 1.25, alcohol negative, urine drug screen negative.  Currently maintained on aspirin and Plavix for CVA prophylaxis x3 weeks then Plavix alone.  Subcutaneous Lovenox for DVT prophylaxis.  Tolerating a regular consistency diet.    Complete NIHSS TOTAL: 0 Glasgow Coma Scale Score: 15  Past Medical History  Past Medical History:  Diagnosis Date  . Basal cell carcinoma 11/09/2019   nod & infil-behind right ear-cx3 &exc  . Basal cell carcinoma 03/21/2020   Residual BCC with peripheral margin involved - ST recommends MOHs  . Carotid artery occlusion   . Coronary artery disease    s/p CABG 2005; sees Dr Johnsie Cancel yearly  . Gynecomastia   . Hypercholesterolemia   . Hypertension   . Neurodermatitis   . Overweight(278.02)   . Personal history of  colonic polyps 10/08/2006   tubular adenomas  . Renal insufficiency   . Transient ischemic attack 2010   "lasted ~ 5 seconds"  . Vitamin D deficiency     Family History  family history includes Arthritis in his brother; Diabetes in his brother, mother, and sister; Fibromyalgia in his sister; Healthy in his daughter; Heart attack (age of onset: 48) in his mother; Heart disease in his brother, brother, and mother; Kidney disease in his mother; Lung cancer in his paternal uncle; Lung cancer (age of onset: 18) in his father.  Prior Rehab/Hospitalizations:  Has the patient had prior rehab or hospitalizations prior to admission? Yes  Has the patient had major surgery during 100 days prior to admission? No  Current Medications   Current  Facility-Administered Medications:  .  acetaminophen (TYLENOL) tablet 650 mg, 650 mg, Oral, Q4H PRN **OR** acetaminophen (TYLENOL) 160 MG/5ML solution 650 mg, 650 mg, Per Tube, Q4H PRN **OR** acetaminophen (TYLENOL) suppository 650 mg, 650 mg, Rectal, Q4H PRN, Karmen Bongo, MD .  aspirin EC tablet 81 mg, 81 mg, Oral, Daily, Karmen Bongo, MD, 81 mg at 04/06/21 0843 .  atorvastatin (LIPITOR) tablet 40 mg, 40 mg, Oral, Daily, Karmen Bongo, MD, 40 mg at 04/06/21 0843 .  carvedilol (COREG) tablet 3.125 mg, 3.125 mg, Oral, BID WC, Samtani, Jai-Gurmukh, MD, 3.125 mg at 04/06/21 0843 .  clopidogrel (PLAVIX) tablet 75 mg, 75 mg, Oral, Daily, Karmen Bongo, MD, 75 mg at 04/06/21 0843 .  enoxaparin (LOVENOX) injection 40 mg, 40 mg, Subcutaneous, Q24H, Karmen Bongo, MD, 40 mg at 04/06/21 0843 .  feeding supplement (GLUCERNA SHAKE) (GLUCERNA SHAKE) liquid 237 mL, 237 mL, Oral, BID BM, Samtani, Jai-Gurmukh, MD, 237 mL at 04/05/21 1615 .  senna-docusate (Senokot-S) tablet 1 tablet, 1 tablet, Oral, QHS PRN, Karmen Bongo, MD  Patients Current Diet:  Diet Order            Diet - low sodium heart healthy           Diet Heart Room service appropriate? Yes; Fluid consistency: Thin  Diet effective ____                 Precautions / Restrictions Precautions Precautions: Fall Restrictions Weight Bearing Restrictions: No   Has the patient had 2 or more falls or a fall with injury in the past year?No  Prior Activity Level Community (5-7x/wk): I without AD, driving, declin ein funciton a couple of weeks  Prior Functional Level Prior Function Level of Independence: Independent with assistive device(s) Comments: Still driving. USed cane occasionally  Self Care: Did the patient need help bathing, dressing, using the toilet or eating?  Independent  Indoor Mobility: Did the patient need assistance with walking from room to room (with or without device)? Independent  Stairs: Did the  patient need assistance with internal or external stairs (with or without device)? Independent  Functional Cognition: Did the patient need help planning regular tasks such as shopping or remembering to take medications? Independent  Home Assistive Devices / Equipment Home Assistive Devices/Equipment: None Home Equipment: Cane - single point,Walker - 2 wheels,Shower seat  Prior Device Use: Indicate devices/aids used by the patient prior to current illness, exacerbation or injury? None of the above  Current Functional Level Cognition  Overall Cognitive Status: Within Functional Limits for tasks assessed Orientation Level: Oriented X4 Safety/Judgement: Decreased awareness of safety General Comments: overall WFL for simple mobiltiy tasks    Extremity Assessment (includes Sensation/Coordination)  Upper Extremity Assessment: LUE deficits/detail LUE  Deficits / Details: mild weakness and decreased coordination, reports numbness in ring finger today LUE Sensation: decreased light touch LUE Coordination: decreased fine motor,decreased gross motor  Lower Extremity Assessment: Defer to PT evaluation LLE Deficits / Details: Grossly 4/5 with MMT, however, with functional mobility, noted buckling and decreased hip flexion when taking a step. Reported LLE felt heavy during gait, but upon return to supine, reports symptoms resolved.    ADLs  Overall ADL's : Needs assistance/impaired Grooming: Minimal assistance,Standing Upper Body Bathing: Set up,Sitting Lower Body Bathing: Min guard,Sitting/lateral leans Lower Body Bathing Details (indicate cue type and reason): simulated via LB dressing from EOB Upper Body Dressing : Set up,Sitting Lower Body Dressing: Min guard,Sitting/lateral leans Lower Body Dressing Details (indicate cue type and reason): to adjust socks from EOB Toilet Transfer: Minimal assistance,Ambulation,RW Toilet Transfer Details (indicate cue type and reason): simulated via functional  mobiltiy with RW and MIN A +1 for balance, cues needed for RW mgmt as pt wanting to pick up RW during ambulation Functional mobility during ADLs: Minimal assistance,Rolling walker,Cueing for safety General ADL Comments: pt with improvements in LUE strength and coordination, pt continues to present with balance impairments    Mobility  Overal bed mobility: Needs Assistance Bed Mobility: Supine to Sit,Sit to Supine Supine to sit: Supervision,HOB elevated Sit to supine: Min guard,HOB elevated General bed mobility comments: supervision - min guard assist for bed mobility, assist needed mostly for line mgmt and safety    Transfers  Overall transfer level: Needs assistance Equipment used: Rolling walker (2 wheeled) Transfers: Sit to/from Stand Sit to Stand: Min guard General transfer comment: min guard assist to rise from EOB, good carryover of hand placement from previous PT session    Ambulation / Gait / Stairs / Wheelchair Mobility  Ambulation/Gait Ambulation/Gait assistance: Herbalist (Feet): 75 Feet Assistive device: Rolling walker (2 wheeled) Gait Pattern/deviations: Step-through pattern,Decreased stride length,Shuffle,Trunk flexed General Gait Details: min assist to steady and guide RW, especially initially, with cues for upright posture, increased foot clearance, larger steps, and placement within middle of RW as pt standing towards R of RW. Gait velocity: decr    Posture / Balance Dynamic Sitting Balance Sitting balance - Comments: sitting EOB with no UE support with supervision Balance Overall balance assessment: Needs assistance Sitting-balance support: Feet supported Sitting balance-Leahy Scale: Fair Sitting balance - Comments: sitting EOB with no UE support with supervision Standing balance support: During functional activity,Bilateral upper extremity supported Standing balance-Leahy Scale: Poor Standing balance comment: reliant on BUE support for  mobility Standardized Balance Assessment Standardized Balance Assessment : Berg Balance Test Berg Balance Test Sit to Stand: Able to stand using hands after several tries Standing Unsupported: Able to stand 2 minutes with supervision Sitting with Back Unsupported but Feet Supported on Floor or Stool: Able to sit safely and securely 2 minutes Stand to Sit: Controls descent by using hands Transfers: Able to transfer with verbal cueing and /or supervision Standing Unsupported with Eyes Closed: Able to stand 3 seconds Standing Ubsupported with Feet Together: Needs help to attain position but able to stand for 30 seconds with feet together From Standing, Reach Forward with Outstretched Arm: Can reach forward >5 cm safely (2") From Standing Position, Pick up Object from Floor: Able to pick up shoe, needs supervision From Standing Position, Turn to Look Behind Over each Shoulder: Turn sideways only but maintains balance Turn 360 Degrees: Needs assistance while turning Standing Unsupported, Alternately Place Feet on Step/Stool: Needs assistance to keep from  falling or unable to try Standing Unsupported, One Foot in Front: Able to take small step independently and hold 30 seconds Standing on One Leg: Tries to lift leg/unable to hold 3 seconds but remains standing independently Total Score: 27    Special needs/care consideration Hgb A1c 6.7     Previous Home Environment  Living Arrangements: Spouse/significant other  Lives With: Spouse Available Help at Discharge: Family,Available 24 hours/day Type of Home: House Home Layout: One level Home Access: Stairs to enter Entrance Stairs-Rails: None Entrance Stairs-Number of Steps: 3 Bathroom Shower/Tub: Chiropodist: Handicapped height Bathroom Accessibility: Yes How Accessible: Accessible via walker [place]: No  Discharge Living Setting Plans for Discharge Living Setting: Patient's home,Lives with (comment)  (wife) Type of Home at Discharge: House Discharge Home Layout: One level Discharge Home Access: Stairs to enter Entrance Stairs-Rails: None Entrance Stairs-Number of Steps: 3 Discharge Bathroom Shower/Tub: Tub/shower unit Discharge Bathroom Toilet: Handicapped height Discharge Bathroom Accessibility: Yes How Accessible: Accessible via walker Does the patient have any problems obtaining your medications?: No  Social/Family/Support Systems Contact Information: wife, [name] Anticipated Caregiver: wife Anticipated Ambulance person Information: see above Ability/Limitations of Caregiver: no limitations Caregiver Availability: 24/7 Discharge Plan Discussed with Primary Caregiver: Yes Is Caregiver In Agreement with Plan?: Yes Does Caregiver/Family have Issues with Lodging/Transportation while Pt is in Rehab?: No  Goals Patient/Family Goal for Rehab: Mod I ot superviison with PT, OT, and SLP Expected length of stay: [facility] 10 to 14 days Pt/Family Agrees to Admission and willing to participate: Yes Program Orientation Provided & Reviewed with Pt/Caregiver Including Roles  & Responsibilities: Yes  Decrease burden of Care through IP rehab admission: n/a  Possible need for SNF placement upon discharge:not anticipated  Patient Condition: This patient's condition remains as documented in the consult dated 03/04/2021, in which the Rehabilitation Physician determined and documented that the patient's condition is appropriate for intensive rehabilitative care in an inpatient rehabilitation facility. Will admit to inpatient rehab today.  Preadmission Screen Completed By:  Cleatrice Burke, RN, 04/06/2021 10:26 AM ______________________________________________________________________   Discussed status with Dr. Dagoberto Ligas on 04/06/2021 at  1029 and received approval for admission today.  Admission Coordinator:  Cleatrice Burke, time 5631 Date 04/06/2021

## 2021-04-04 NOTE — Evaluation (Signed)
Occupational Therapy Evaluation Patient Details Name: [name] [name] MRN: [id number] DOB: 05-09-1941 Today's Date: 04/04/2021    History of Present Illness Pt is a 80 y/o male admitted 4/4 secondary to numbness in LLE, L cheek and L fingers. CT negative and MRI reveals small acute R thalamic infarct.[name] PMH includes HTN, CAD s/p CABG, and DM.   Clinical Impression   PTA patient independent and driving. Admitted for above and limited by problem list below, including L sided weakness, decreased coordination and impaired sensation, impaired balance and decreased activity tolerance. Patient currently requires min-mod assist for LB ADLs, setup for UB seated ADLs, and min assist for transfers using RW.  In room mobility with min-mod assist at times due to L lateral lean/loss of balance using RW.  Believe he will benefit from further OT services while admitted and after dc at CIR level to optimize independence and safety with ADLs, IADls for return to PLOF. Will follow acutely.     Follow Up Recommendations  CIR;Supervision/Assistance - 24 hour    Equipment Recommendations  3 in 1 bedside commode;Other (comment) (RW)    Recommendations for Other Services Rehab consult     Precautions / Restrictions Precautions Precautions: Fall Restrictions Weight Bearing Restrictions: No      Mobility Bed Mobility Overal bed mobility: Needs Assistance Bed Mobility: Sit to Supine       Sit to supine: Min guard   General bed mobility comments: for safety, increased time required    Transfers Overall transfer level: Needs assistance Equipment used: Rolling walker (2 wheeled) Transfers: Sit to/from Stand Sit to Stand: Min assist         General transfer comment: min assist to power up fully and steady, cueing for hand placement and safety; L lean without UE support    Balance Overall balance assessment: Needs assistance Sitting-balance support: No upper extremity supported;Feet  supported Sitting balance-Leahy Scale: Fair     Standing balance support: Bilateral upper extremity supported;No upper extremity supported;During functional activity Standing balance-Leahy Scale: Poor Standing balance comment: relies heavily on UE and external support                           ADL either performed or assessed with clinical judgement   ADL Overall ADL's : Needs assistance/impaired     Grooming: Minimal assistance;Standing   Upper Body Bathing: Set up;Sitting   Lower Body Bathing: Minimal assistance;Sit to/from stand   Upper Body Dressing : Set up;Sitting   Lower Body Dressing: Moderate assistance;Sit to/from stand Lower Body Dressing Details (indicate cue type and reason): min assist to fully manage socks, min assist in standing Toilet Transfer: Minimal assistance;Ambulation;RW Toilet Transfer Details (indicate cue type and reason): simulated to recliner         Functional mobility during ADLs: Minimal assistance;Moderate assistance;Rolling walker;Cueing for safety General ADL Comments: pt limited by L weakness and decreased coordination, impaired balance, and safety awareness     Vision   Additional Comments: need to assess     Perception     Praxis      Pertinent Vitals/Pain Pain Assessment: No/denies pain     Hand Dominance Left   Extremity/Trunk Assessment Upper Extremity Assessment Upper Extremity Assessment: LUE deficits/detail LUE Deficits / Details: mild weakness and decreased coordination, reports numbness in ring finger today LUE Sensation: decreased light touch LUE Coordination: decreased fine motor;decreased gross motor   Lower Extremity Assessment Lower Extremity Assessment: Defer to PT evaluation  Communication Communication Communication: No difficulties   Cognition Arousal/Alertness: Awake/alert Behavior During Therapy: WFL for tasks assessed/performed Overall Cognitive Status: Impaired/Different from  baseline Area of Impairment: Safety/judgement;Awareness;Problem solving                         Safety/Judgement: Decreased awareness of safety;Decreased awareness of deficits Awareness: Emergent Problem Solving: Slow processing;Requires verbal cues;Difficulty sequencing General Comments: pt with decreased awareness to deficits and safety, cueing for problem solving   General Comments  spouse and daughter present, educated on CIR recommendations; with mobility pt requires min-mod assist at times due to LOB towards L side    Exercises     Shoulder Instructions      Home Living Family/patient expects to be discharged to:: Private residence Living Arrangements: Spouse/significant other Available Help at Discharge: Family Type of Home: House Home Access: Stairs to enter Technical brewer of Steps: 3 Entrance Stairs-Rails: None Home Layout: One level     Bathroom Shower/Tub: Teacher, early years/pre: Handicapped height     Home Equipment: [place] - single point;Walker - 2 wheels;Shower seat          Prior Functioning/Environment Level of Independence: Independent with assistive device(s)        Comments: Still driving. USed cane occasionally        OT Problem List: Decreased strength;Decreased activity tolerance;Impaired balance (sitting and/or standing);Decreased coordination;Decreased safety awareness;Decreased knowledge of use of DME or AE;Decreased knowledge of precautions;Impaired UE functional use;Impaired sensation      OT Treatment/Interventions: Self-care/ADL training;Neuromuscular education;DME and/or AE instruction;Therapeutic activities;Cognitive remediation/compensation;Patient/family education;Balance training    OT Goals(Current goals can be found in the care plan section) Acute Rehab OT Goals Patient Stated Goal: to get better OT Goal Formulation: With patient Time For Goal Achievement: 04/18/21 Potential to Achieve Goals: Good   OT Frequency: Min 2X/week   Barriers to D/C:            Co-evaluation              AM-PAC OT "6 Clicks" Daily Activity     Outcome Measure Help from another person eating meals?: A Little Help from another person taking care of personal grooming?: A Little Help from another person toileting, which includes using toliet, bedpan, or urinal?: A Lot Help from another person bathing (including washing, rinsing, drying)?: A Little Help from another person to put on and taking off regular upper body clothing?: A Little Help from another person to put on and taking off regular lower body clothing?: A Lot 6 Click Score: 16   End of Session Equipment Utilized During Treatment: Gait belt;Rolling walker Nurse Communication: Mobility status  Activity Tolerance: Patient tolerated treatment well Patient left: in chair;with call bell/phone within reach;with chair alarm set;with nursing/sitter in room;with family/visitor present  OT Visit Diagnosis: Other abnormalities of gait and mobility (R26.89);Muscle weakness (generalized) (M62.81);Other symptoms and signs involving the nervous system (R29.898)                Time: 2395-3202 OT Time Calculation (min): 43 min Charges:  OT General Charges $OT Visit: 1 Visit OT Evaluation $OT Eval Moderate Complexity: 1 Mod OT Treatments $Self Care/Home Management : 8-22 mins  Jolaine Artist, OT Acute Rehabilitation Services Pager [phone] Office [phone]   Delight Stare 04/04/2021, 10:39 AM

## 2021-04-04 NOTE — Progress Notes (Addendum)
STROKE TEAM PROGRESS NOTE   INTERVAL HISTORY His wife and daughter are at the bedside.  On exam this AM patient is eager to return home. Patient's family reports that they feel patient has been a bit more irritable because he may be going through nicotine withdrawal, but the patient refused nicotine patch. Patient reports that he continues to have decreased sensation in the left side of his lip and left thumb, although he feels it is significantly less than yesterday. Patient has not been up to his feet since yesterday's exam.  Patient and family were made aware that patient had a small infarct of his thalamus that explained his symptoms. Patient and family were made aware that patient would likely be able to make a recovery, but he will require PT/OT and will need to be assessed by both. Patient and family reported understanding of this plan. Daughter reported that patient stopped his Plavix of 12 years approx 2-3 months ago after talking with his doctor, as he felt he had been on the medication for a long time.   Vitals:   04/03/21 2020 04/04/21 0030 04/04/21 0355 04/04/21 0758  BP: (!) 146/57 138/67 (!) 152/73 (!) 157/79  Pulse: 68 64 65 66  Resp: 17 18 17 18   Temp: 98.7 F (37.1 C) 98.2 F (36.8 C) 97.6 F (36.4 C) 97.8 F (36.6 C)  TempSrc: Oral Oral Oral Oral  SpO2: 94% 95% 95% 98%   CBC:  Recent Labs  Lab 04/03/21 0515 04/04/21 0835  WBC 6.2 6.7  NEUTROABS 4.1 4.6  HGB 16.0 15.8  HCT 46.6 45.0  MCV 101.3* 102.0*  PLT 251 536   Basic Metabolic Panel:  Recent Labs  Lab 04/03/21 0515 04/04/21 0835  NA 137 138  K 3.5 3.7  CL 98 104  CO2 29 28  GLUCOSE 190* 119*  BUN 14 10  CREATININE 1.25* 1.10  CALCIUM 8.7* 8.2*   Lipid Panel:  Recent Labs  Lab 04/03/21 1111  CHOL 143  TRIG 130  HDL 31*  CHOLHDL 4.6  VLDL 26  LDLCALC 86   HgbA1c:  Recent Labs  Lab 04/03/21 1111  HGBA1C 6.7*   Urine Drug Screen:  Recent Labs  Lab 04/03/21 0515  LABOPIA NONE  DETECTED  COCAINSCRNUR NONE DETECTED  LABBENZ NONE DETECTED  AMPHETMU NONE DETECTED  THCU NONE DETECTED  LABBARB NONE DETECTED    Alcohol Level  Recent Labs  Lab 04/03/21 0515  ETH <10    IMAGING past 24 hours ECHOCARDIOGRAM COMPLETE  Result Date: 04/03/2021    ECHOCARDIOGRAM REPORT   Patient Name:   [name] [name] Date of Exam: 04/03/2021 Medical Rec #:  [id number]        Height:       72.0 in Accession #:    [id number]       Weight:       231.0 lb Date of Birth:  Jun 22, 1941       BSA:          2.265 m Patient Age:    47 years         BP:           159/76 mmHg Patient Gender: M                HR:           71 bpm. Exam Location:  Inpatient Procedure: 2D Echo, Cardiac Doppler and Color Doppler Indications:    TIA  History:  Patient has no prior history of Echocardiogram examinations.                 CAD, TIA; Risk Factors:Hypertension and Dyslipidemia.  Sonographer:    [place] Referring Phys: [id number] [place]  1. Left ventricular ejection fraction, by estimation, is 60 to 65%. The left ventricle has normal function. The left ventricle has no regional wall motion abnormalities. Left ventricular diastolic parameters are indeterminate.  2. Right ventricular systolic function is normal. The right ventricular size is normal.  3. The mitral valve is normal in structure. No evidence of mitral valve regurgitation. No evidence of mitral stenosis.  4. The aortic valve is tricuspid. There is mild calcification of the aortic valve. Aortic valve regurgitation is not visualized. Mild to moderate aortic valve sclerosis/calcification is present, without any evidence of aortic stenosis.  5. The inferior vena cava is normal in size with greater than 50% respiratory variability, suggesting right atrial pressure of 3 mmHg. FINDINGS  Left Ventricle: Left ventricular ejection fraction, by estimation, is 60 to 65%. The left ventricle has normal function. The left ventricle has no regional wall  motion abnormalities. The left ventricular internal cavity size was normal in size. There is  no left ventricular hypertrophy. Left ventricular diastolic parameters are indeterminate. Right Ventricle: The right ventricular size is normal. No increase in right ventricular wall thickness. Right ventricular systolic function is normal. Left Atrium: Left atrial size was normal in size. Right Atrium: Right atrial size was normal in size. Pericardium: There is no evidence of pericardial effusion. Mitral Valve: The mitral valve is normal in structure. No evidence of mitral valve regurgitation. No evidence of mitral valve stenosis. Tricuspid Valve: The tricuspid valve is normal in structure. Tricuspid valve regurgitation is trivial. No evidence of tricuspid stenosis. Aortic Valve: The aortic valve is tricuspid. There is mild calcification of the aortic valve. Aortic valve regurgitation is not visualized. Mild to moderate aortic valve sclerosis/calcification is present, without any evidence of aortic stenosis. Aortic valve mean gradient measures 3.5 mmHg. Aortic valve peak gradient measures 6.1 mmHg. Aortic valve area, by VTI measures 2.12 cm. Pulmonic Valve: The pulmonic valve was normal in structure. Pulmonic valve regurgitation is mild. No evidence of pulmonic stenosis. Aorta: The aortic root is normal in size and structure. Venous: The inferior vena cava is normal in size with greater than 50% respiratory variability, suggesting right atrial pressure of 3 mmHg. IAS/Shunts: The interatrial septum was not well visualized.  LEFT VENTRICLE PLAX 2D LVIDd:         4.60 cm LVIDs:         3.40 cm LV PW:         1.30 cm LV IVS:        1.40 cm LVOT diam:     1.80 cm LV SV:         53 LV SV Index:   23 LVOT Area:     2.54 cm  LV Volumes (MOD) LV vol d, MOD A2C: 35.9 ml LV vol d, MOD A4C: 70.2 ml LV vol s, MOD A4C: 24.8 ml LV SV MOD A4C:     70.2 ml RIGHT VENTRICLE TAPSE (M-mode): 1.2 cm LEFT ATRIUM             Index       RIGHT  ATRIUM           Index LA Vol (A2C):   59.6 ml 26.31 ml/m RA Area:     13.10  cm LA Vol (A4C):   52.6 ml 23.22 ml/m RA Volume:   28.50 ml  12.58 ml/m LA Biplane Vol: 57.4 ml 25.34 ml/m  AORTIC VALVE                   PULMONIC VALVE AV Area (Vmax):    1.90 cm    PV Vmax:       0.97 m/s AV Area (Vmean):   1.98 cm    PV Vmean:      71.800 cm/s AV Area (VTI):     2.12 cm    PV VTI:        0.216 m AV Vmax:           123.50 cm/s PV Peak grad:  3.8 mmHg AV Vmean:          88.350 cm/s PV Mean grad:  2.0 mmHg AV VTI:            0.249 m AV Peak Grad:      6.1 mmHg AV Mean Grad:      3.5 mmHg LVOT Vmax:         92.30 cm/s LVOT Vmean:        68.700 cm/s LVOT VTI:          0.207 m LVOT/AV VTI ratio: 0.83  AORTA Ao Root diam: 3.40 cm Ao Asc diam:  2.90 cm MITRAL VALVE MV Area (PHT): 3.85 cm    SHUNTS MV Decel Time: 197 msec    Systemic VTI:  0.21 m MV E velocity: 70.40 cm/s  Systemic Diam: 1.80 cm MV A velocity: 77.10 cm/s MV E/A ratio:  0.91 [name] MD Electronically signed by [name] MD Signature Date/Time: 04/03/2021/3:27:08 PM    Final     PHYSICAL EXAM Constitutional: Appears well-developed and well-nourished.  Psych: Affect appropriate to situation Eyes: No scleral injection HENT: No OP obstruction MSK: no joint deformities.  Cardiovascular: Normal rate and regular rhythm.  Respiratory: Effort normal, non-labored breathing GI: Soft. No distension.  Skin: WDI  Neuro: Mental Status: Patient is awake, alert, oriented to person, place, month, year, and situation. Patient is able to give a clear and coherent history. No signs of aphasia or neglect Cranial Nerves: II: Visual Fields are full. Pupils are equal, round, and reactive to light.  III,IV, VI: EOMI without ptosis or diploplia.  V: Facial sensation is symmetric to temperature, patient does report some very mild numbness of left side of lips; although decreased from yesterday VII: Facial movement is symmetric.  VIII: hearing is  intact to voice X: Uvula elevates symmetrically XI: Shoulder shrug is symmetric. XII: tongue is midline without atrophy or fasciculations.  Motor: Tone is normal. Bulk is normal. 4+/5 strength was present in all four extremities.  Sensory: Sensation is symmetric to light touch and temperature in the arms and legs.  Cerebellar: FNF intact bilaterally, Heel to shin intact bilaterally. Some mild left pronator drift noted on exam. Gait: Deferred, patient was being assisted by OT up to walker at the end of exam.   ASSESSMENT/PLAN  [name] is a 80 y.o. male with history of a history of hyperlipidemia, hypertension, recently diagnosed diabetes who presented with left-sided numbness and weakness most consistent with transient ischemic attack. However on MRI, patient was noted to have a possible acute small right thalamic infarct. At this time etiology is likely small vessel occlusion. Patient was also noted to have multiple chronic lacunar infarcts. Patient appears to be improving with  no worsening of symptoms and does not endorse any other neurological deficits. It was also again discussed with family that the patient will need to follow up OP with his vascular surgeon due to the sever stenosis of his L ICA.    Stroke - small R thalamus infarct, likely small vessel disease  CT head No acute abnormality.  Noted small vessel disease w/ mild progression since 2010.   CTA head & neck left ICA 70 to 80% stenosis  MRI: acute small right thalamic infarct, with multiple chronic lacunar infacrts  2D Echo EF 60 to 65%  LDL 57  HgbA1c 6.4  VTE prophylaxis - Lovenox  aspirin 81 mg daily prior to admission, now on aspirin 81 mg daily and Plavix 75mg .  Continue DAPT for 3 weeks and then Plavix alone.  Therapy recommendations:  PT/OT pending  Disposition: Pending PT/ OT consult  Neurology will sign off  Carotid stenosis  Status post right CEA  CTA neck showed left ICA 70 to 80%  stenosis  Asymptomatic left ICA stenosis  Follow-up with vascular surgery Dr. Donnetta Hutching as outpatient  Hypertension  Home meds:  Cozaar 50mg  daily  Permissive hypertension (OK if <220/120) for 24-48 hours post stroke and then gradually normalized within 5-7 days.  stable  Long-term BP goal normotensive  Hyperlipidemia  Home meds:  Lipitor 10mg    LDL 57, goal < 70  On Lipitor 40  Continue statin at discharge  Tobacco abuse  Current chewing tobacco  Cessation counseling provided  Pt is willing to quit  Other Stroke Risk Factors  Advanced Age >/= 9   Obesity,  BMI >/= 30 associated with increased stroke risk, recommend weight loss, diet and exercise as appropriate   Hx of TIA  Coronary artery disease status post CABG 2005  Hospital Day 1  Damita Dunnings, MD PGY-1  ATTENDING NOTE: I reviewed above note and agree with the assessment and plan. Pt was seen and examined.   Wife and daughter at bedside.  Patient lying in bed, awake alert, neuro stable.  Still has mild left nasolabial fold flattening and right arm mild weakness with pronator drift.  Subjective left thumb decrease light touch sensation.  Otherwise neurologically intact.  MRI showed right thalamic small infarct, currently on DAPT for 3 weeks and then Plavix alone, continue Lipitor 40.  Left ICA asymptomatic 70 to 80% stenosis, he follows with Dr. Donnetta Hutching for history of right CEA, recommend to continue follow-up with Dr. Donnetta Hutching for left ICA stenosis.  PT/OT recommend CIR.  For detailed assessment and plan, please refer to above as I have made changes wherever appropriate.   Neurology will sign off. Please call with questions. Pt will follow up with Dr. Tomi Likens at [facility] neurology in about 4 weeks. Thanks for the consult.   Rosalin Hawking, MD PhD Stroke Neurology 04/04/2021 6:56 PM     To contact Stroke Continuity provider, please refer to [email or web address] After hours, contact General Neurology

## 2021-04-04 NOTE — Progress Notes (Signed)
PROGRESS NOTE   [name] [name]  YYT:[id number] DOB: 1941/10/21 DOA: 04/03/2021 PCP: Pleas Koch, NP  Brief Narrative:  80 year old community dwelling right male TIA 2010 CABG 2005 Right carotid endarterectomy with patch angioplasty 2016 HTN HLD prior basal cell CA BMI >40 Came to emergency room 4/4 AM secondary to left facial numbness left leg weakness which resolved after an hour  Developed recurrent symptoms in the afternoon after admission at around 11:30 AM with slurred speech left upper extremity ataxia and mild balance dysfunction NIH was 2 and felt not to be a candidate for TPA because of minimal symptoms  Echo EF 60%, MRI right small thalamic infarct LDL 57 A1c 6 Evaluated by therapy services none will be a candidate for CIR placement if insurance authorization is obtained will follow up with [facility] for asymptomatic left ICA stenosis  Hospital-Problem based course  Right-sided small infarct Defer further planning to neurology, ASA Plavix X 3 weeks and then aspirin alone subsequently Needs outpatient left ICA work-up with [facility]-we will forward to vascular surgeon Secondary prevention statin Lipitor 40, consideration of A1c in 3 to 4 months and initiation of hypoglycemic agents Stop chewing tobacco-advised CABG 2005 Several beats of SVT Resume Cozaar on discharge Add low-dose Coreg 3.125 today HTN Resume HCTZ 12.5 daily Hyperglycemia Consideration for Metformin 500 in the outpatient setting   DVT prophylaxis: Lovenox Code Status: Full Family Communication: Discussed with wife [name] at the bedside Disposition:  Status is: Observation  The patient will require care spanning > 2 midnights and should be moved to inpatient because: Hemodynamically unstable, Unsafe d/c plan and IV treatments appropriate due to intensity of illness or inability to take PO  Dispo: The patient is from: Home              Anticipated d/c is to: CIR              Patient currently is not  medically stable to d/c.   Difficult to place patient No       Consultants:   Neurology  Rehab    Procedures: Multiple   Antimicrobials: None   Subjective:  Awake slow speech tells me was a little bit unsteady on his feet No shortness of breath chest pain at this time  Objective: Vitals:   04/04/21 0030 04/04/21 0355 04/04/21 0758 04/04/21 1202  BP: 138/67 (!) 152/73 (!) 157/79 (!) 144/68  Pulse: 64 65 66 63  Resp: 18 17 18 18   Temp: 98.2 F (36.8 C) 97.6 F (36.4 C) 97.8 F (36.6 C) 98 F (36.7 C)  TempSrc: Oral Oral Oral Oral  SpO2: 95% 95% 98% 95%    Intake/Output Summary (Last 24 hours) at 04/04/2021 1451 Last data filed at 04/04/2021 0900 Gross per 24 hour  Intake 560 ml  Output --  Net 560 ml   There were no vitals filed for this visit.  Examination:  Finger-nose-finger shows dysmetria on the left side Power 5/5 bilaterally upper extremities Vision by direct confrontation is intact Shoulder shrug by laterally symmetric Uvula protrudes midline tongue protrudes midline smile symmetric S1-S2 slightly bradycardic Telemetry = normal sinus rhythm with 12 beats of SVT Gait not assessed Sensory intact lower extremities reflexes somewhat brisk  Data Reviewed: personally reviewed   CBC    Component Value Date/Time   WBC 6.7 04/04/2021 0835   RBC 4.41 04/04/2021 0835   HGB 15.8 04/04/2021 0835   HCT 45.0 04/04/2021 0835   PLT 216 04/04/2021 0835   MCV 102.0 (  H) 04/04/2021 0835   MCH 35.8 (H) 04/04/2021 0835   MCHC 35.1 04/04/2021 0835   RDW 12.9 04/04/2021 0835   LYMPHSABS 1.2 04/04/2021 0835   MONOABS 0.7 04/04/2021 0835   EOSABS 0.2 04/04/2021 0835   BASOSABS 0.0 04/04/2021 0835   CMP Latest Ref Rng & Units 04/04/2021 04/03/2021 06/02/2020  Glucose 70 - 99 mg/dL 119(H) 190(H) 136(H)  BUN 8 - 23 mg/dL 10 14 19   Creatinine 0.61 - 1.24 mg/dL 1.10 1.25(H) 1.14  Sodium 135 - 145 mmol/L 138 137 139  Potassium 3.5 - 5.1 mmol/L 3.7 3.5 4.5  Chloride 98  - 111 mmol/L 104 98 102  CO2 22 - 32 mmol/L 28 29 32  Calcium 8.9 - 10.3 mg/dL 8.2(L) 8.7(L) 9.1  Total Protein 6.5 - 8.1 g/dL 5.8(L) 6.2(L) 6.5  Total Bilirubin 0.3 - 1.2 mg/dL 1.4(H) 0.9 0.9  Alkaline Phos 38 - 126 U/L 64 68 74  AST 15 - 41 U/L 22 26 22   ALT 0 - 44 U/L 22 26 25      Radiology Studies: CT ANGIO HEAD NECK W WO CM (CODE STROKE)  Result Date: 04/03/2021 CLINICAL DATA:  Stroke follow-up. EXAM: CT ANGIOGRAPHY HEAD AND NECK TECHNIQUE: Multidetector CT imaging of the head and neck was performed using the standard protocol during bolus administration of intravenous contrast. Multiplanar CT image reconstructions and MIPs were obtained to evaluate the vascular anatomy. Carotid stenosis measurements (when applicable) are obtained utilizing NASCET criteria, using the distal internal carotid diameter as the denominator. CONTRAST:  96mL OMNIPAQUE IOHEXOL 350 MG/ML SOLN COMPARISON:  Same day head CT.  MRA June 04, 2009. FINDINGS: CTA NECK FINDINGS Aortic arch: Great vessel origins are patent. Atherosclerosis of the aorta and branch vessels. Right carotid system: Atherosclerosis of the common carotid artery and at the carotid bifurcation without greater than 50% stenosis. Left carotid system: Mixed calcific and noncalcific atherosclerosis at the carotid bifurcation and involving the proximal ICA with approximately 70-80% stenosis of the proximal ICA. Vertebral arteries: Left dominant. Approximately 50% stenosis of the right vertebral artery origin secondary to atherosclerosis. Mild narrowing of the left vertebral artery origin secondary to predominately calcific atherosclerosis. Remainder of the vertebral arteries are patent bilaterally. z Skeleton: Mild multilevel degenerative disc disease. Other neck: No mass or suspicious adenopathy. Upper chest: Visualized lung apices are clear. Partially imaged median sternotomy. Review of the MIP images confirms the above findings CTA HEAD FINDINGS Anterior  circulation: Bilateral calcific atherosclerosis of the cavernous and paraclinoid ICAs with approximately 60% stenosis of the left cavernous ICA. No evidence of greater than 50% stenosis on the right. Bilateral MCA and ACAs are patent without evidence of proximal hemodynamically significant stenosis. 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 Trifurcation of the ACA. No aneurysm identified. Posterior circulation: Bilateral intradural vertebral arteries and basilar artery are patent with mild narrowing of the right vertebral artery at its dural margin. Left fetal type PCA. Bilateral PCAs are patent without evidence of proximal hemodynamically significant stenosis. No aneurysm identified Venous sinuses: As permitted by contrast timing, patent. Anatomic variants: As detailed above Review of the MIP images confirms the above findings IMPRESSION: CTA Head: 1. No emergent large vessel occlusion. 2. 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 CTA Neck: 1. Approximately 70-80% stenosis of the proximal internal carotid artery just distal to the carotid bifurcation. 2. Approximately 50% stenosis of the right vertebral artery origin. Electronically Signed   By: Roslynn Amble  Ronnald Ramp MD   On: 04/03/2021 08:25   CT HEAD WO CONTRAST  Result Date: 04/03/2021 CLINICAL DATA:  80 year old male with numbness tingling, paresthesia. EXAM: CT HEAD WITHOUT CONTRAST TECHNIQUE: Contiguous axial images were obtained from the base of the skull through the vertex without intravenous contrast. COMPARISON:  Brain MRI 06/04/2009.  Head CT 06/03/2009. FINDINGS: Brain: No midline shift, mass effect, or evidence of intracranial mass lesion. No ventriculomegaly. No acute intracranial hemorrhage identified. Patchy and confluent hypodensity in the bilateral cerebral white matter, deep gray matter nuclei. This is mildly progressed since 2010. No cortically based acute infarct  identified. No cortical encephalomalacia identified. Vascular: Calcified atherosclerosis at the skull base. No suspicious intracranial vascular hyperdensity. Skull: Stable.  No acute osseous abnormality identified. Sinuses/Orbits: Visualized paranasal sinuses and mastoids are stable and well pneumatized. Other: Tiny right forehead scalp lipoma (benign series 4, image 37). Postoperative changes to both globes. No acute orbit or scalp soft tissue finding. IMPRESSION: 1. No acute intracranial abnormality identified. 2. Advanced chronic small vessel disease. Mild progression since 2010. Electronically Signed   By: Genevie Ann M.D.   On: 04/03/2021 06:02   MR BRAIN WO CONTRAST  Result Date: 04/03/2021 CLINICAL DATA:  TIA. Left facial numbness and left leg weakness, now resolved. EXAM: MRI HEAD WITHOUT CONTRAST TECHNIQUE: Multiplanar, multiecho pulse sequences of the brain and surrounding structures were obtained without intravenous contrast. COMPARISON:  Head CT and CTA 04/03/2021.  Head MRI 06/04/2009. FINDINGS: The study is mildly motion degraded. Brain: There is an 8 mm focus of mild trace diffusion weighted signal hyperintensity laterally in the right thalamus with the suggestion of subtly reduced ADC (most conspicuous on the coronal ADC map) likely reflecting an acute infarct with this clinical history. Patchy to confluent T2 hyperintensities in the cerebral white matter bilaterally have mildly progressed from the prior MRI and are nonspecific but compatible with moderately severe chronic small vessel ischemic disease. Chronic lacunar infarcts are again noted in the deep cerebral white matter bilaterally, right basal ganglia, left thalamus, and pons. There is mild generalized cerebral atrophy. No intracranial hemorrhage, mass, midline shift, or extra-axial fluid collection is identified. Vascular: Major intracranial vascular flow voids are preserved. Skull and upper cervical spine: Unremarkable bone marrow signal.  Sinuses/Orbits: Unremarkable orbits. Trace right mastoid effusion. Clear paranasal sinuses. Other: Small right frontal scalp lipoma. IMPRESSION: 1. Suspected small acute right thalamic infarct. 2. 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 Electronically Signed   By: Logan Bores M.D.   On: 04/03/2021 11:02   ECHOCARDIOGRAM COMPLETE  Result Date: 04/03/2021    ECHOCARDIOGRAM REPORT   Patient Name:   BARTH TRELLA Date of Exam: 04/03/2021 Medical Rec #:  [id number]        Height:       72.0 in Accession #:    [id number]       Weight:       231.0 lb Date of Birth:  1941/01/10       BSA:          2.265 m Patient Age:    43 years         BP:           159/76 mmHg Patient Gender: M                HR:           71 bpm. Exam Location:  Inpatient Procedure: 2D Echo, Cardiac Doppler and Color Doppler Indications:  TIA  History:        Patient has no prior history of Echocardiogram examinations.                 CAD, TIA; Risk Factors:Hypertension and Dyslipidemia.  Sonographer:    [place] Referring Phys: [id number] [place]  1. Left ventricular ejection fraction, by estimation, is 60 to 65%. The left ventricle has normal function. The left ventricle has no regional wall motion abnormalities. Left ventricular diastolic parameters are indeterminate.  2. Right ventricular systolic function is normal. The right ventricular size is normal.  3. The mitral valve is normal in structure. No evidence of mitral valve regurgitation. No evidence of mitral stenosis.  4. The aortic valve is tricuspid. There is mild calcification of the aortic valve. Aortic valve regurgitation is not visualized. Mild to moderate aortic valve sclerosis/calcification is present, without any evidence of aortic stenosis.  5. The inferior vena cava is normal in size with greater than 50% respiratory variability, suggesting right atrial pressure of 3 mmHg. FINDINGS  Left Ventricle:  Left ventricular ejection fraction, by estimation, is 60 to 65%. The left ventricle has normal function. The left ventricle has no regional wall motion abnormalities. The left ventricular internal cavity size was normal in size. There is  no left ventricular hypertrophy. Left ventricular diastolic parameters are indeterminate. Right Ventricle: The right ventricular size is normal. No increase in right ventricular wall thickness. Right ventricular systolic function is normal. Left Atrium: Left atrial size was normal in size. Right Atrium: Right atrial size was normal in size. Pericardium: There is no evidence of pericardial effusion. Mitral Valve: The mitral valve is normal in structure. No evidence of mitral valve regurgitation. No evidence of mitral valve stenosis. Tricuspid Valve: The tricuspid valve is normal in structure. Tricuspid valve regurgitation is trivial. No evidence of tricuspid stenosis. Aortic Valve: The aortic valve is tricuspid. There is mild calcification of the aortic valve. Aortic valve regurgitation is not visualized. Mild to moderate aortic valve sclerosis/calcification is present, without any evidence of aortic stenosis. Aortic valve mean gradient measures 3.5 mmHg. Aortic valve peak gradient measures 6.1 mmHg. Aortic valve area, by VTI measures 2.12 cm. Pulmonic Valve: The pulmonic valve was normal in structure. Pulmonic valve regurgitation is mild. No evidence of pulmonic stenosis. Aorta: The aortic root is normal in size and structure. Venous: The inferior vena cava is normal in size with greater than 50% respiratory variability, suggesting right atrial pressure of 3 mmHg. IAS/Shunts: The interatrial septum was not well visualized.  LEFT VENTRICLE PLAX 2D LVIDd:         4.60 cm LVIDs:         3.40 cm LV PW:         1.30 cm LV IVS:        1.40 cm LVOT diam:     1.80 cm LV SV:         53 LV SV Index:   23 LVOT Area:     2.54 cm  LV Volumes (MOD) LV vol d, MOD A2C: 35.9 ml LV vol d, MOD A4C:  70.2 ml LV vol s, MOD A4C: 24.8 ml LV SV MOD A4C:     70.2 ml RIGHT VENTRICLE TAPSE (M-mode): 1.2 cm LEFT ATRIUM             Index       RIGHT ATRIUM           Index LA Vol (A2C):   59.6  ml 26.31 ml/m RA Area:     13.10 cm LA Vol (A4C):   52.6 ml 23.22 ml/m RA Volume:   28.50 ml  12.58 ml/m LA Biplane Vol: 57.4 ml 25.34 ml/m  AORTIC VALVE                   PULMONIC VALVE AV Area (Vmax):    1.90 cm    PV Vmax:       0.97 m/s AV Area (Vmean):   1.98 cm    PV Vmean:      71.800 cm/s AV Area (VTI):     2.12 cm    PV VTI:        0.216 m AV Vmax:           123.50 cm/s PV Peak grad:  3.8 mmHg AV Vmean:          88.350 cm/s PV Mean grad:  2.0 mmHg AV VTI:            0.249 m AV Peak Grad:      6.1 mmHg AV Mean Grad:      3.5 mmHg LVOT Vmax:         92.30 cm/s LVOT Vmean:        68.700 cm/s LVOT VTI:          0.207 m LVOT/AV VTI ratio: 0.83  AORTA Ao Root diam: 3.40 cm Ao Asc diam:  2.90 cm MITRAL VALVE MV Area (PHT): 3.85 cm    SHUNTS MV Decel Time: 197 msec    Systemic VTI:  0.21 m MV E velocity: 70.40 cm/s  Systemic Diam: 1.80 cm MV A velocity: 77.10 cm/s MV E/A ratio:  0.91 [name] MD Electronically signed by [name] MD Signature Date/Time: 04/03/2021/3:27:08 PM    Final      Scheduled Meds: . aspirin EC  81 mg Oral Daily  . atorvastatin  40 mg Oral Daily  . carvedilol  3.125 mg Oral BID WC  . clopidogrel  75 mg Oral Daily  . enoxaparin (LOVENOX) injection  40 mg Subcutaneous Q24H   Continuous Infusions: . sodium chloride 50 mL/hr at 04/03/21 1300     LOS: 0 days   Time spent: 39  Nita Sells, MD [facility] To contact the attending provider between 7A-7P or the covering provider during after hours 7P-7A, please log into the web site [email or web address] and access using universal [place] password for that web site. If you do not have the password, please call the hospital operator.  04/04/2021, 2:51 PM

## 2021-04-04 NOTE — Progress Notes (Signed)
Initial Nutrition Assessment  DOCUMENTATION CODES:  Not applicable  INTERVENTION:  Continue current diet as ordered, consider diet liberalization if intake does not improve  Educated on need for adequate nutrition in preserving lean muscle mass  Glucerna Shake po BID, each supplement provides 220 kcal and 10 grams of protein  Requested measured weight be obtained by nursing staff  NUTRITION DIAGNOSIS:  Inadequate oral intake related to poor appetite as evidenced by meal completion < 50%,per patient/family report.  GOAL:  Patient will meet greater than or equal to 90% of their needs  MONITOR:  PO intake,Supplement acceptance  REASON FOR ASSESSMENT:  Consult Assessment of nutrition requirement/status  ASSESSMENT:  Pt admitted with left-sided numbness. Work-up in ED consistent with TIA. PMH relevant for CAD s/p CABGx4, HTN, HLD, and vitamin d deficiency.  Pt resting in bedside chair at the time of visit. Lunch tray at bedside, partially consumed. Noted 42% average intake x 3 recorded meals (ranges 25-50% intake). Pt reports that he hasn't had much of an appetite since being admitted. Reports that at home typically he eats well and that weight it stable ~215-220 lb. Lives at home with his wife at baseline and was independent of ADLs. Reports that PT has recommended he use a walker upon dc. Pt agreeable to receiving a nutrition supplement until appetite is at baseline.    Medications reviewed. Labs reviewed: Last A1c 6.7% 4/4.   NUTRITION - FOCUSED PHYSICAL EXAM: Flowsheet Row Most Recent Value  Orbital Region No depletion  Upper Arm Region No depletion  Thoracic and Lumbar Region No depletion  Buccal Region No depletion  Temple Region No depletion  Clavicle Bone Region No depletion  Clavicle and Acromion Bone Region Mild depletion  Scapular Bone Region Mild depletion  Dorsal Hand No depletion  Patellar Region No depletion  Anterior Thigh Region No depletion  Posterior Calf  Region No depletion  Edema (RD Assessment) None  Hair Reviewed  Eyes Reviewed  Mouth Reviewed  Skin Reviewed  Nails Reviewed     Diet Order:   Diet Order            Diet Heart Room service appropriate? Yes; Fluid consistency: Thin  Diet effective ____                EDUCATION NEEDS:  No education needs have been identified at this time  Skin:  Skin Assessment: Reviewed RN Assessment  Last BM:  unsure  Height:  Ht Readings from Last 1 Encounters:  03/10/21 6' (1.829 m)   Weight:  Wt Readings from Last 5 Encounters:  03/10/21 104.8 kg  09/09/20 103 kg  06/10/20 104.7 kg  04/22/20 105.7 kg  10/20/19 101 kg    Ideal Body Weight:  80.9 kg  BMI:  There is no height or weight on file to calculate BMI.  Estimated Nutritional Needs:   Kcal:  1800-2100  Protein:  90-100  Fluid:  2L/d  Ranell Patrick, RD, LDN Clinical Dietitian Pager on [place]

## 2021-04-04 NOTE — Progress Notes (Signed)
Inpatient Rehabilitation Admissions Coordinator  I met at bedside with patient , his wife and daughter. We discussed goals and expectations of a possible CIR admit. They are in agreement. I will begin insurance authorization for a possible CIR admit.  Danne Baxter, RN, MSN Rehab Admissions Coordinator [phone] 04/04/2021 6:22 PM

## 2021-04-04 NOTE — Consult Note (Signed)
Physical Medicine and Rehabilitation Consult Reason for Consult: Left side weakness Referring Physician: Dr. Shauna Hugh   HPI: [name] [name] is a 80 y.o. right-handed male with history of TIA, hypertension, hyperlipidemia, CAD with CABG 2005 maintained on aspirin, right CEA and questionable medical compliance.  Per chart review lives with spouse independent with assistive device.  1 level home 3 steps to entry.  Presented 04/03/2021 with acute onset left side weakness.  Cranial CT scan negative for acute changes.  CT angiogram of head and neck showed no emergent large vessel occlusion.  Approximately 50% stenosis of the left cavernous internal carotid artery and approximately 70 to 80% stenosis of the proximal internal carotid artery just distal to the carotid bifurcation.  MRI of the brain showed small acute right thalamic infarction.  Echocardiogram with ejection fraction of 60 to 65% no wall motion abnormalities.  Admission chemistries unremarkable except glucose 190, creatinine 1.25, alcohol negative, urine drug screen negative.  Currently maintained on aspirin and Plavix for CVA prophylaxis x3 weeks then Plavix alone.  Subcutaneous Lovenox for DVT prophylaxis.  Tolerating a regular consistency diet.  Therapy evaluations completed due to patient's decreased functional mobility recommendations of physical medicine rehab consult.  Pt denies pain- LBM Sunday- hasn't eaten a lot- and it's pretty normal for him.  Has a little numbness of L lip but not L hand anymore- also still feels a trace weakness of leg.  Per PT note, pt has Berg scale 27/54- and loss of balance/lateral lean.  Per wife, has had wide/staggering shortened gait for 1+ months- no AD.   Review of Systems  Constitutional: Negative for chills and fever.  HENT: Negative for hearing loss.   Eyes: Negative for blurred vision.  Respiratory: Negative for cough and shortness of breath.   Cardiovascular: Negative for chest pain,  palpitations and leg swelling.  Gastrointestinal: Positive for constipation. Negative for heartburn, nausea and vomiting.  Genitourinary: Negative for dysuria, flank pain and hematuria.  Musculoskeletal: Positive for myalgias.  Skin: Negative for rash.  Neurological: Positive for dizziness and weakness.  All other systems reviewed and are negative.  Past Medical History:  Diagnosis Date  . Basal cell carcinoma 11/09/2019   nod & infil-behind right ear-cx3 &exc  . Basal cell carcinoma 03/21/2020   Residual BCC with peripheral margin involved - ST recommends MOHs  . Carotid artery occlusion   . Coronary artery disease    s/p CABG 2005; sees Dr Johnsie Cancel yearly  . Gynecomastia   . Hypercholesterolemia   . Hypertension   . Neurodermatitis   . Overweight(278.02)   . Personal history of colonic polyps 10/08/2006   tubular adenomas  . Renal insufficiency   . Transient ischemic attack 2010   "lasted ~ 5 seconds"  . Vitamin D deficiency    Past Surgical History:  Procedure Laterality Date  . CARDIAC CATHETERIZATION  02/2004   "tried to stent; couldn't"  . CAROTID ENDARTERECTOMY Right 08/26/2015  . CORONARY ANGIOPLASTY    . CORONARY ARTERY BYPASS GRAFT  Feb. 2005   4 vessel  . ENDARTERECTOMY Right 08/26/2015   Procedure: Right Carotid ENDARTERECTOMY with Patch Angioplasty ;  Surgeon: Rosetta Posner, MD;  Location: [facility];  Service: Vascular;  Laterality: Right;  . 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 Dessie Coma  . KELOID EXCISION    . PILONIDAL CYST EXCISION  1989   Family History  Problem Relation Age of Onset  . Heart disease Mother  Before age 23  . Diabetes Mother   . Kidney disease Mother   . Heart attack Mother 44  . Lung cancer Father 49  . Diabetes Brother   . Heart disease Brother   . Heart disease Brother   . Arthritis Brother   . Diabetes Sister   . Fibromyalgia Sister   . Lung cancer Paternal Uncle        questionable as to if it was lung ca  .  Healthy Daughter   . Colon cancer Neg Hx   . Stroke Neg Hx    Social History:  reports that he has never smoked. His smokeless tobacco use includes snuff. He reports that he does not drink alcohol and does not use drugs. Allergies:  Allergies  Allergen Reactions  . Niacin Other (See Comments)    REACTION: intol to NIACIN w/ headaches REACTION: intol to NIACIN w/ headaches   Medications Prior to Admission  Medication Sig Dispense Refill  . aspirin EC 81 MG tablet Take 1 tablet (81 mg total) by mouth daily.    [name] atorvastatin (LIPITOR) 20 MG tablet TAKE 1/2 TABLET BY MOUTH EVERY DAY (Patient taking differently: Take 10 mg by mouth daily.) 45 tablet 3  . blood glucose meter kit and supplies KIT Dispense based on patient and insurance preference. Use up to four times daily as directed. (FOR ICD-9 250.00, 250.01). 1 each 0  . Cholecalciferol (VITAMIN D) 2000 UNITS CAPS Take 2,000 Units by mouth daily.    . hydrochlorothiazide (MICROZIDE) 12.5 MG capsule Take 1 capsule (12.5 mg total) by mouth daily. Please make yearly appt with Dr. Johnsie Cancel for April 2022 for future refills. Thank you 1st attempt (Patient taking differently: Take 12.5 mg by mouth daily.) 90 capsule 0  . losartan (COZAAR) 25 MG tablet TAKE 2 TABLETS BY MOUTH EVERY DAY (Patient taking differently: Take 50 mg by mouth daily.) 180 tablet 1  . nitroGLYCERIN (NITROSTAT) 0.4 MG SL tablet Place 1 tablet (0.4 mg total) under the tongue every 5 (five) minutes as needed for chest pain. 25 tablet 3  . vitamin B-12 (CYANOCOBALAMIN) 1000 MCG tablet Take 1,000 mcg by mouth daily.      Home: Home Living Family/patient expects to be discharged to:: Private residence Living Arrangements: Spouse/significant other Available Help at Discharge: Family Type of Home: House Home Access: Stairs to enter Technical brewer of Steps: 3 Entrance Stairs-Rails: None Home Layout: One level Bathroom Shower/Tub: Chiropodist:  Handicapped height [place]: [place] - single point,Walker - 2 wheels,Shower seat  Functional History: Prior Function Level of Independence: Independent with assistive device(s) Comments: Still driving. USed cane occasionally Functional Status:  Mobility: Bed Mobility Overal bed mobility: Needs Assistance Bed Mobility: Sit to Supine Sit to supine: Min guard General bed mobility comments: for safety, increased time required Transfers Overall transfer level: Needs assistance Equipment used: Rolling walker (2 wheeled) Transfers: Sit to/from Stand Sit to Stand: Min assist General transfer comment: min assist to power up fully and steady, cueing for hand placement and safety; L lean without UE support Ambulation/Gait Ambulation/Gait assistance: Min assist,Mod assist Gait Distance (Feet): 50 Feet Assistive device: None Gait Pattern/deviations: Step-through pattern,Decreased weight shift to left,Staggering left General Gait Details: Pt with multiple LOB during gait. Decreased step height in LLE and noted foot drag in LLE. Required cues for increased step height. Noted buckling at knees as well. Initially requiring min A, but then required mod A for remainder of gait. Neuro MD present to evaluate.  Gait velocity: Decreased    ADL: ADL Overall ADL's : Needs assistance/impaired Grooming: Minimal assistance,Standing Upper Body Bathing: Set up,Sitting Lower Body Bathing: Minimal assistance,Sit to/from stand Upper Body Dressing : Set up,Sitting Lower Body Dressing: Moderate assistance,Sit to/from stand Lower Body Dressing Details (indicate cue type and reason): min assist to fully manage socks, min assist in standing Toilet Transfer: Minimal assistance,Ambulation,RW Toilet Transfer Details (indicate cue type and reason): simulated to recliner Functional mobility during ADLs: Minimal assistance,Moderate assistance,Rolling walker,Cueing for safety General ADL Comments: pt limited by L  weakness and decreased coordination, impaired balance, and safety awareness  Cognition: Cognition Overall Cognitive Status: Impaired/Different from baseline Orientation Level: Oriented X4 Cognition Arousal/Alertness: Awake/alert Behavior During Therapy: WFL for tasks assessed/performed Overall Cognitive Status: Impaired/Different from baseline Area of Impairment: Safety/judgement,Awareness,Problem solving Safety/Judgement: Decreased awareness of safety,Decreased awareness of deficits Awareness: Emergent Problem Solving: Slow processing,Requires verbal cues,Difficulty sequencing General Comments: pt with decreased awareness to deficits and safety, cueing for problem solving  Blood pressure (!) 144/68, pulse 63, temperature 98 F (36.7 C), temperature source Oral, resp. rate 18, SpO2 95 %. Physical Exam Vitals and nursing note reviewed. Exam conducted with a chaperone present.  Constitutional:      Comments: Pt is sitting up in bed- wife at bedside- was transferred with NT assistance to bed from chair at beginning of interview-- required moderate assistance to get to be bed using RW, NAD  HENT:     Head: Normocephalic.     Comments: Very trace L lower lip numbness Mild L facial droop- but noted was drooling from L side of mouth x2 Tongue midline    Right Ear: External ear normal.     Left Ear: External ear normal.     Nose: Nose normal. No congestion.     Mouth/Throat:     Mouth: Mucous membranes are moist.     Pharynx: Oropharynx is clear. No oropharyngeal exudate.  Eyes:     General:        Right eye: No discharge.        Left eye: No discharge.     Extraocular Movements: Extraocular movements intact.     Comments: 2 beats nystagmus seen horizontally- EOMI B/L  Cardiovascular:     Rate and Rhythm: Normal rate and regular rhythm.     Heart sounds: Normal heart sounds. No murmur heard. No gallop.   Pulmonary:     Comments: CTA B/L- no W/R/R- good air movement Abdominal:      Comments: Soft, NT, ND, (+)BS  - hypoactive  Genitourinary:    Comments: Condom cath draining light amber urine Musculoskeletal:     Cervical back: Normal range of motion and neck supple.     Comments: UEs 5/5- biceps, triceps, WE, grip and finger abd B/L LEs- 5/5 in HF, KE, DF and PF B/L  Skin:    General: Skin is warm and dry.     Comments: IV R AC fossa- looks OK- getting 50cc/hour NS IVFs Buttocks look clear- no wounds  Neurological:     Comments: Patient is alert in no acute distress.  Makes eye contact with examiner follows commands.  Oriented to person place and time.  Fair awareness of deficits. Ox3- Drooling on L side of mouth Intact to light touch in all 4 extremities- except face as above Finger to nose impaired B/L- very searching pattern Rapid alternating movements slowed, but intact B/L Heel to shin impaired B/L     Results for orders placed or performed during  the hospital encounter of 04/03/21 (from the past 24 hour(s))  Comprehensive metabolic panel     Status: Abnormal   Collection Time: 04/04/21  8:35 AM  Result Value Ref Range   Sodium 138 135 - 145 mmol/L   Potassium 3.7 3.5 - 5.1 mmol/L   Chloride 104 98 - 111 mmol/L   CO2 28 22 - 32 mmol/L   Glucose, Bld 119 (H) 70 - 99 mg/dL   BUN 10 8 - 23 mg/dL   Creatinine, Ser 1.10 0.61 - 1.24 mg/dL   Calcium 8.2 (L) 8.9 - 10.3 mg/dL   Total Protein 5.8 (L) 6.5 - 8.1 g/dL   Albumin 3.1 (L) 3.5 - 5.0 g/dL   AST 22 15 - 41 U/L   ALT 22 0 - 44 U/L   Alkaline Phosphatase 64 38 - 126 U/L   Total Bilirubin 1.4 (H) 0.3 - 1.2 mg/dL   GFR, Estimated >60 >60 mL/min   Anion gap 6 5 - 15  CBC with Differential/Platelet     Status: Abnormal   Collection Time: 04/04/21  8:35 AM  Result Value Ref Range   WBC 6.7 4.0 - 10.5 K/uL   RBC 4.41 4.22 - 5.81 MIL/uL   Hemoglobin 15.8 13.0 - 17.0 g/dL   HCT 45.0 39.0 - 52.0 %   MCV 102.0 (H) 80.0 - 100.0 fL   MCH 35.8 (H) 26.0 - 34.0 pg   MCHC 35.1 30.0 - 36.0 g/dL   RDW 12.9  11.5 - 15.5 %   Platelets 216 150 - 400 K/uL   nRBC 0.0 0.0 - 0.2 %   Neutrophils Relative % 69 %   Neutro Abs 4.6 1.7 - 7.7 K/uL   Lymphocytes Relative 18 %   Lymphs Abs 1.2 0.7 - 4.0 K/uL   Monocytes Relative 10 %   Monocytes Absolute 0.7 0.1 - 1.0 K/uL   Eosinophils Relative 2 %   Eosinophils Absolute 0.2 0.0 - 0.5 K/uL   Basophils Relative 0 %   Basophils Absolute 0.0 0.0 - 0.1 K/uL   Immature Granulocytes 1 %   Abs Immature Granulocytes 0.06 0.00 - 0.07 K/uL   CT ANGIO HEAD NECK W WO CM (CODE STROKE)  Result Date: 04/03/2021 CLINICAL DATA:  Stroke follow-up. EXAM: CT ANGIOGRAPHY HEAD AND NECK TECHNIQUE: Multidetector CT imaging of the head and neck was performed using the standard protocol during bolus administration of intravenous contrast. Multiplanar CT image reconstructions and MIPs were obtained to evaluate the vascular anatomy. Carotid stenosis measurements (when applicable) are obtained utilizing NASCET criteria, using the distal internal carotid diameter as the denominator. CONTRAST:  6m OMNIPAQUE IOHEXOL 350 MG/ML SOLN COMPARISON:  Same day head CT.  MRA June 04, 2009. FINDINGS: CTA NECK FINDINGS Aortic arch: Great vessel origins are patent. Atherosclerosis of the aorta and branch vessels. Right carotid system: Atherosclerosis of the common carotid artery and at the carotid bifurcation without greater than 50% stenosis. Left carotid system: Mixed calcific and noncalcific atherosclerosis at the carotid bifurcation and involving the proximal ICA with approximately 70-80% stenosis of the proximal ICA. Vertebral arteries: Left dominant. Approximately 50% stenosis of the right vertebral artery origin secondary to atherosclerosis. Mild narrowing of the left vertebral artery origin secondary to predominately calcific atherosclerosis. Remainder of the vertebral arteries are patent bilaterally. z Skeleton: Mild multilevel degenerative disc disease. Other neck: No mass or suspicious adenopathy.  Upper chest: Visualized lung apices are clear. Partially imaged median sternotomy. Review of the MIP images confirms the above findings  CTA HEAD FINDINGS Anterior circulation: Bilateral calcific atherosclerosis of the cavernous and paraclinoid ICAs with approximately 60% stenosis of the left cavernous ICA. No evidence of greater than 50% stenosis on the right. Bilateral MCA and ACAs are patent without evidence of proximal hemodynamically significant stenosis. 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 Trifurcation of the ACA. No aneurysm identified. Posterior circulation: Bilateral intradural vertebral arteries and basilar artery are patent with mild narrowing of the right vertebral artery at its dural margin. Left fetal type PCA. Bilateral PCAs are patent without evidence of proximal hemodynamically significant stenosis. No aneurysm identified Venous sinuses: As permitted by contrast timing, patent. Anatomic variants: As detailed above Review of the MIP images confirms the above findings IMPRESSION: CTA Head: 1. No emergent large vessel occlusion. 2. 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 CTA Neck: 1. Approximately 70-80% stenosis of the proximal internal carotid artery just distal to the carotid bifurcation. 2. Approximately 50% stenosis of the right vertebral artery origin. Electronically Signed   By: Margaretha Sheffield MD   On: 04/03/2021 08:25   CT HEAD WO CONTRAST  Result Date: 04/03/2021 CLINICAL DATA:  80 year old male with numbness tingling, paresthesia. EXAM: CT HEAD WITHOUT CONTRAST TECHNIQUE: Contiguous axial images were obtained from the base of the skull through the vertex without intravenous contrast. COMPARISON:  Brain MRI 06/04/2009.  Head CT 06/03/2009. FINDINGS: Brain: No midline shift, mass effect, or evidence of intracranial mass lesion. No ventriculomegaly. No acute intracranial hemorrhage identified. Patchy and  confluent hypodensity in the bilateral cerebral white matter, deep gray matter nuclei. This is mildly progressed since 2010. No cortically based acute infarct identified. No cortical encephalomalacia identified. Vascular: Calcified atherosclerosis at the skull base. No suspicious intracranial vascular hyperdensity. Skull: Stable.  No acute osseous abnormality identified. Sinuses/Orbits: Visualized paranasal sinuses and mastoids are stable and well pneumatized. Other: Tiny right forehead scalp lipoma (benign series 4, image 37). Postoperative changes to both globes. No acute orbit or scalp soft tissue finding. IMPRESSION: 1. No acute intracranial abnormality identified. 2. Advanced chronic small vessel disease. Mild progression since 2010. Electronically Signed   By: Genevie Ann M.D.   On: 04/03/2021 06:02   MR BRAIN WO CONTRAST  Result Date: 04/03/2021 CLINICAL DATA:  TIA. Left facial numbness and left leg weakness, now resolved. EXAM: MRI HEAD WITHOUT CONTRAST TECHNIQUE: Multiplanar, multiecho pulse sequences of the brain and surrounding structures were obtained without intravenous contrast. COMPARISON:  Head CT and CTA 04/03/2021.  Head MRI 06/04/2009. FINDINGS: The study is mildly motion degraded. Brain: There is an 8 mm focus of mild trace diffusion weighted signal hyperintensity laterally in the right thalamus with the suggestion of subtly reduced ADC (most conspicuous on the coronal ADC map) likely reflecting an acute infarct with this clinical history. Patchy to confluent T2 hyperintensities in the cerebral white matter bilaterally have mildly progressed from the prior MRI and are nonspecific but compatible with moderately severe chronic small vessel ischemic disease. Chronic lacunar infarcts are again noted in the deep cerebral white matter bilaterally, right basal ganglia, left thalamus, and pons. There is mild generalized cerebral atrophy. No intracranial hemorrhage, mass, midline shift, or extra-axial  fluid collection is identified. Vascular: Major intracranial vascular flow voids are preserved. Skull and upper cervical spine: Unremarkable bone marrow signal. Sinuses/Orbits: Unremarkable orbits. Trace right mastoid effusion. Clear paranasal sinuses. Other: Small right frontal scalp lipoma. IMPRESSION: 1. Suspected small acute right thalamic infarct. 2. Moderately severe chronic  small vessel ischemic disease with multiple chronic lacunar infarcts. Electronically Signed   By: Logan Bores M.D.   On: 04/03/2021 11:02   ECHOCARDIOGRAM COMPLETE  Result Date: 04/03/2021    ECHOCARDIOGRAM REPORT   Patient Name:   KEEFER SOULLIERE Date of Exam: 04/03/2021 Medical Rec #:  [id number]        Height:       72.0 in Accession #:    [id number]       Weight:       231.0 lb Date of Birth:  [birth year]       BSA:          2.265 m Patient Age:    22 years         BP:           159/76 mmHg Patient Gender: M                HR:           71 bpm. Exam Location:  Inpatient Procedure: 2D Echo, Cardiac Doppler and Color Doppler Indications:    TIA  History:        Patient has no prior history of Echocardiogram examinations.                 CAD, TIA; Risk Factors:Hypertension and Dyslipidemia.  Sonographer:    [place] Referring Phys: [id number] [place]  1. Left ventricular ejection fraction, by estimation, is 60 to 65%. The left ventricle has normal function. The left ventricle has no regional wall motion abnormalities. Left ventricular diastolic parameters are indeterminate.  2. Right ventricular systolic function is normal. The right ventricular size is normal.  3. The mitral valve is normal in structure. No evidence of mitral valve regurgitation. No evidence of mitral stenosis.  4. The aortic valve is tricuspid. There is mild calcification of the aortic valve. Aortic valve regurgitation is not visualized. Mild to moderate aortic valve sclerosis/calcification is present, without any evidence of aortic stenosis.  5.  The inferior vena cava is normal in size with greater than 50% respiratory variability, suggesting right atrial pressure of 3 mmHg. FINDINGS  Left Ventricle: Left ventricular ejection fraction, by estimation, is 60 to 65%. The left ventricle has normal function. The left ventricle has no regional wall motion abnormalities. The left ventricular internal cavity size was normal in size. There is  no left ventricular hypertrophy. Left ventricular diastolic parameters are indeterminate. Right Ventricle: The right ventricular size is normal. No increase in right ventricular wall thickness. Right ventricular systolic function is normal. Left Atrium: Left atrial size was normal in size. Right Atrium: Right atrial size was normal in size. Pericardium: There is no evidence of pericardial effusion. Mitral Valve: The mitral valve is normal in structure. No evidence of mitral valve regurgitation. No evidence of mitral valve stenosis. Tricuspid Valve: The tricuspid valve is normal in structure. Tricuspid valve regurgitation is trivial. No evidence of tricuspid stenosis. Aortic Valve: The aortic valve is tricuspid. There is mild calcification of the aortic valve. Aortic valve regurgitation is not visualized. Mild to moderate aortic valve sclerosis/calcification is present, without any evidence of aortic stenosis. Aortic valve mean gradient measures 3.5 mmHg. Aortic valve peak gradient measures 6.1 mmHg. Aortic valve area, by VTI measures 2.12 cm. Pulmonic Valve: The pulmonic valve was normal in structure. Pulmonic valve regurgitation is mild. No evidence of pulmonic stenosis. Aorta: The aortic root is normal in size and structure. Venous: The inferior vena cava is  normal in size with greater than 50% respiratory variability, suggesting right atrial pressure of 3 mmHg. IAS/Shunts: The interatrial septum was not well visualized.  LEFT VENTRICLE PLAX 2D LVIDd:         4.60 cm LVIDs:         3.40 cm LV PW:         1.30 cm LV IVS:         1.40 cm LVOT diam:     1.80 cm LV SV:         53 LV SV Index:   23 LVOT Area:     2.54 cm  LV Volumes (MOD) LV vol d, MOD A2C: 35.9 ml LV vol d, MOD A4C: 70.2 ml LV vol s, MOD A4C: 24.8 ml LV SV MOD A4C:     70.2 ml RIGHT VENTRICLE TAPSE (M-mode): 1.2 cm LEFT ATRIUM             Index       RIGHT ATRIUM           Index LA Vol (A2C):   59.6 ml 26.31 ml/m RA Area:     13.10 cm LA Vol (A4C):   52.6 ml 23.22 ml/m RA Volume:   28.50 ml  12.58 ml/m LA Biplane Vol: 57.4 ml 25.34 ml/m  AORTIC VALVE                   PULMONIC VALVE AV Area (Vmax):    1.90 cm    PV Vmax:       0.97 m/s AV Area (Vmean):   1.98 cm    PV Vmean:      71.800 cm/s AV Area (VTI):     2.12 cm    PV VTI:        0.216 m AV Vmax:           123.50 cm/s PV Peak grad:  3.8 mmHg AV Vmean:          88.350 cm/s PV Mean grad:  2.0 mmHg AV VTI:            0.249 m AV Peak Grad:      6.1 mmHg AV Mean Grad:      3.5 mmHg LVOT Vmax:         92.30 cm/s LVOT Vmean:        68.700 cm/s LVOT VTI:          0.207 m LVOT/AV VTI ratio: 0.83  AORTA Ao Root diam: 3.40 cm Ao Asc diam:  2.90 cm MITRAL VALVE MV Area (PHT): 3.85 cm    SHUNTS MV Decel Time: 197 msec    Systemic VTI:  0.21 m MV E velocity: 70.40 cm/s  Systemic Diam: 1.80 cm MV A velocity: 77.10 cm/s MV E/A ratio:  0.91 [name] MD Electronically signed by [name] MD Signature Date/Time: 04/03/2021/3:27:08 PM    Final      Assessment/Plan: Diagnosis: 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 Does the need for close, 24 hr/day medical supervision in concert with the patient's rehab needs make it unreasonable for this patient to be served in a less intensive setting? Yes 2. Co-Morbidities requiring supervision/potential complications: TIAs, CABG 2005, CAD, HTN, HLD- stopped plavix 3 months ago with agreement by Cardiology; L handed, of note 3. Due to bladder management, bowel management, safety, skin/wound care, disease management, medication  administration, pain management and patient education, does the patient require 24 hr/day rehab  nursing? Yes 4. Does the patient require coordinated care of a physician, rehab nurse, therapy disciplines of PT, ot and maybe SLP to address physical and functional deficits in the context of the above medical diagnosis(es)? Yes Addressing deficits in the following areas: balance, endurance, locomotion, strength, transferring, bowel/bladder control, bathing, dressing, feeding, grooming, toileting and swallowing 5. Can the patient actively participate in an intensive therapy program of at least 3 hrs of therapy per day at least 5 days per week? Yes 6. The potential for patient to make measurable gains while on inpatient rehab is excellent 7. Anticipated functional outcomes upon discharge from inpatient rehab are modified independent and supervision  with PT, modified independent and supervision with OT, modified independent with SLP. 8. Estimated rehab length of stay to reach the above functional goals is: 10-14 days 9. Anticipated discharge destination: Home 10. Overall Rehab/Functional Prognosis: excellent  RECOMMENDATIONS: This patient's condition is appropriate for continued rehabilitative care in the following setting: CIR Patient has agreed to participate in recommended program. Potentially Note that insurance prior authorization may be required for reimbursement for recommended care.  Comment:  1. Pt is a very good candidate for CIR- will submit for Admissions coordinators.  2. Suggest SLP when comes to CIR, due to the drooling on L side- just to make sure- esp because wife notes occ pt coughs with thin liquids.  3. Based on prolonged d/w wife- sounds like he was having different TIA Sx's for last 1+ months-  4. Thank you for this consult.    Lavon Paganini Angiulli, PA-C 04/04/2021   I have personally performed a face to face diagnostic evaluation of this patient and formulated the key components  of the plan.  Additionally, I have personally reviewed laboratory data, imaging studies, as well as relevant notes and concur with the physician assistant's documentation above.

## 2021-04-05 ENCOUNTER — Telehealth: Payer: Self-pay

## 2021-04-05 ENCOUNTER — Telehealth: Payer: Self-pay | Admitting: Neurology

## 2021-04-05 LAB — CBC WITH DIFFERENTIAL/PLATELET
Abs Immature Granulocytes: 0.07 10*3/uL (ref 0.00–0.07)
Basophils Absolute: 0 10*3/uL (ref 0.0–0.1)
Basophils Relative: 1 %
Eosinophils Absolute: 0.2 10*3/uL (ref 0.0–0.5)
Eosinophils Relative: 3 %
HCT: 42.4 % (ref 39.0–52.0)
Hemoglobin: 14.9 g/dL (ref 13.0–17.0)
Immature Granulocytes: 1 %
Lymphocytes Relative: 20 %
Lymphs Abs: 1.3 10*3/uL (ref 0.7–4.0)
MCH: 35.7 pg — ABNORMAL HIGH (ref 26.0–34.0)
MCHC: 35.1 g/dL (ref 30.0–36.0)
MCV: 101.7 fL — ABNORMAL HIGH (ref 80.0–100.0)
Monocytes Absolute: 0.8 10*3/uL (ref 0.1–1.0)
Monocytes Relative: 12 %
Neutro Abs: 4.3 10*3/uL (ref 1.7–7.7)
Neutrophils Relative %: 63 %
Platelets: 193 10*3/uL (ref 150–400)
RBC: 4.17 MIL/uL — ABNORMAL LOW (ref 4.22–5.81)
RDW: 12.7 % (ref 11.5–15.5)
WBC: 6.7 10*3/uL (ref 4.0–10.5)
nRBC: 0 % (ref 0.0–0.2)

## 2021-04-05 LAB — BASIC METABOLIC PANEL
Anion gap: 5 (ref 5–15)
BUN: 10 mg/dL (ref 8–23)
CO2: 28 mmol/L (ref 22–32)
Calcium: 8.3 mg/dL — ABNORMAL LOW (ref 8.9–10.3)
Chloride: 105 mmol/L (ref 98–111)
Creatinine, Ser: 1.09 mg/dL (ref 0.61–1.24)
GFR, Estimated: >60 mL/min (ref >60)
Glucose, Bld: 119 mg/dL — ABNORMAL HIGH (ref 70–99)
Potassium: 3.9 mmol/L (ref 3.5–5.1)
Sodium: 138 mmol/L (ref 135–145)

## 2021-04-05 LAB — MAGNESIUM: Magnesium: 2 mg/dL (ref 1.7–2.4)

## 2021-04-05 MED ORDER — CLOPIDOGREL BISULFATE 75 MG PO TABS: 75.0000 mg | ORAL_TABLET | Freq: Every day | ORAL | 3 refills | Status: DC

## 2021-04-05 MED ORDER — ASPIRIN EC 81 MG PO TBEC: 81.0000 mg | DELAYED_RELEASE_TABLET | Freq: Every day | ORAL | 0 refills | Status: DC

## 2021-04-05 NOTE — Progress Notes (Signed)
Inpatient Rehabilitation Admissions Coordinator   I have received insurance approval for CIR but no bed today. I will follow up tomorrow for bed availability. I met with patient and his wife at bedside and they are aware.  Danne Baxter, RN, MSN Rehab Admissions Coordinator [phone] 04/05/2021 4:28 PM

## 2021-04-05 NOTE — Plan of Care (Signed)
  Problem: Education: Goal: Knowledge of General Education information will improve Description: Including pain rating scale, medication(s)/side effects and non-pharmacologic comfort measures Outcome: Progressing   Problem: Health Behavior/Discharge Planning: Goal: Ability to manage health-related needs will improve Outcome: Progressing   Problem: Clinical Measurements: Goal: Ability to maintain clinical measurements within normal limits will improve Outcome: Progressing Goal: Will remain free from infection Outcome: Progressing Goal: Diagnostic test results will improve Outcome: Progressing Goal: Respiratory complications will improve Outcome: Progressing Goal: Cardiovascular complication will be avoided Outcome: Progressing   Problem: Activity: Goal: Risk for activity intolerance will decrease Outcome: Progressing   Problem: Nutrition: Goal: Adequate nutrition will be maintained Outcome: Progressing   Problem: Coping: Goal: Level of anxiety will decrease Outcome: Progressing   Problem: Elimination: Goal: Will not experience complications related to bowel motility Outcome: Progressing Goal: Will not experience complications related to urinary retention Outcome: Progressing   Problem: Pain Managment: Goal: General experience of comfort will improve Outcome: Progressing   Problem: Safety: Goal: Ability to remain free from injury will improve Outcome: Progressing   Problem: Skin Integrity: Goal: Risk for impaired skin integrity will decrease Outcome: Progressing   Problem: Education: Goal: Knowledge of disease or condition will improve Outcome: Progressing Goal: Knowledge of secondary prevention will improve Outcome: Progressing Goal: Knowledge of patient specific risk factors addressed and post discharge goals established will improve Outcome: Progressing   Problem: Self-Care: Goal: Ability to communicate needs accurately will improve Outcome: Progressing

## 2021-04-05 NOTE — Telephone Encounter (Signed)
Unable to do TCM at this time pt is still in the hospital, spoke with his wife he is in the [facility] rehab at this time and will be there for a week to two weeks depending on how he does.

## 2021-04-05 NOTE — Telephone Encounter (Signed)
error 

## 2021-04-05 NOTE — Discharge Summary (Signed)
Physician Discharge Summary  [name] [name] RXV:[id number] DOB: Oct 02, 1941 DOA: 04/03/2021  PCP: Pleas Koch, NP  Admit date: 04/03/2021 Discharge date: 04/05/2021  Admitted From: Home.  Disposition:  CIR   Recommendations for Outpatient Follow-up:  1. Follow up with PCP in 1-2 weeks 2. Please obtain BMP/CBC in one week 3. Please follow up with neurology as recommended.  4. Please follow up with Dr Donnetta Hutching for left ICA stenosis.     Discharge Condition: stable.  CODE STATUS: full code.  Diet recommendation: Heart Healthy    Brief/Interim Summary:  80 year old male TIA, CABG 2005 , Right carotid endarterectomy with patch angioplasty 2016. HTN HLD prior basal cell CA BMI >40, Came to emergency room 4/4 AM secondary to left facial numbness left leg weakness which resolved after an hour  Developed recurrent symptoms in the afternoon after admission at around 11:30 AM with slurred speech left upper extremity ataxia and mild balance dysfunction NIH was 2 and felt not to be a candidate for TPA because of minimal symptoms  Echo showed  EF 60%, MRI right small thalamic infarct LDL 57 A1c 6  therapy evaluations recommending CIR.     Discharge Diagnoses:  Principal Problem:   TIA (transient ischemic attack) Active Problems:   Essential hypertension   Type 2 diabetes mellitus ([place])   Carotid artery disease (H[facility])  Right sided small thalamic infarct Neurology consutled, recommended DAPT FOR  3 weeks followed by plavix alone,  Continue with statin on discharge.  Recommend outpatient follow up with neurology as recommended.  Pt will need outpatient follow up with vascular surgery for Left ICA stenosis Up to 80% .  Hypertension:  Resume cozaar and coreg added.    Type 2 DM with hyperglycemia:  CBG (last 3)  No results for input(s): GLUCAP in the last 72 hours.  Last A1c is 6   Discharge Instructions  Discharge Instructions    Ambulatory referral to Neurology   Complete  by: As directed    Follow up with Dr. Tomi Likens in 4-6 weeks. Thanks.   Diet - low sodium heart healthy   Complete by: As directed    Increase activity slowly   Complete by: As directed      Allergies as of 04/05/2021      Reactions   Niacin Other (See Comments)   REACTION: intol to NIACIN w/ headaches REACTION: intol to NIACIN w/ headaches      Medication List    STOP taking these medications   hydrochlorothiazide 12.5 MG capsule Commonly known as: MICROZIDE     TAKE these medications   acetaminophen 325 MG tablet Commonly known as: TYLENOL Take 2 tablets (650 mg total) by mouth every 4 (four) hours as needed for mild pain (or temp > 37.5 C (99.5 F)).   aspirin EC 81 MG tablet Take 1 tablet (81 mg total) by mouth daily.   atorvastatin 40 MG tablet Commonly known as: LIPITOR Take 1 tablet (40 mg total) by mouth daily. What changed:   medication strength  how much to take   blood glucose meter kit and supplies Kit Dispense based on patient and insurance preference. Use up to four times daily as directed. (FOR ICD-9 250.00, 250.01).   carvedilol 3.125 MG tablet Commonly known as: COREG Take 1 tablet (3.125 mg total) by mouth 2 (two) times daily with a meal.   clopidogrel 75 MG tablet Commonly known as: PLAVIX Take 1 tablet (75 mg total) by mouth daily.   losartan 25  MG tablet Commonly known as: COZAAR TAKE 2 TABLETS BY MOUTH EVERY DAY   nitroGLYCERIN 0.4 MG SL tablet Commonly known as: NITROSTAT Place 1 tablet (0.4 mg total) under the tongue every 5 (five) minutes as needed for chest pain.   vitamin B-12 1000 MCG tablet Commonly known as: CYANOCOBALAMIN Take 1,000 mcg by mouth daily.   Vitamin D 50 MCG (2000 UT) Caps Take 2,000 Units by mouth daily.       Follow-up Information    Pieter Partridge, DO. Schedule an appointment as soon as possible for a visit in 4 week(s).   Specialty: Neurology Contact information: [address] [place]  [id number] [phone]              Allergies  Allergen Reactions  . Niacin Other (See Comments)    REACTION: intol to NIACIN w/ headaches REACTION: intol to NIACIN w/ headaches    Consultations:  Neurology.    Procedures/Studies: CT ANGIO HEAD NECK W WO CM (CODE STROKE)  Result Date: 04/03/2021 CLINICAL DATA:  Stroke follow-up. EXAM: CT ANGIOGRAPHY HEAD AND NECK TECHNIQUE: Multidetector CT imaging of the head and neck was performed using the standard protocol during bolus administration of intravenous contrast. Multiplanar CT image reconstructions and MIPs were obtained to evaluate the vascular anatomy. Carotid stenosis measurements (when applicable) are obtained utilizing NASCET criteria, using the distal internal carotid diameter as the denominator. CONTRAST:  83m OMNIPAQUE IOHEXOL 350 MG/ML SOLN COMPARISON:  Same day head CT.  MRA June 04, 2009. FINDINGS: CTA NECK FINDINGS Aortic arch: Great vessel origins are patent. Atherosclerosis of the aorta and branch vessels. Right carotid system: Atherosclerosis of the common carotid artery and at the carotid bifurcation without greater than 50% stenosis. Left carotid system: Mixed calcific and noncalcific atherosclerosis at the carotid bifurcation and involving the proximal ICA with approximately 70-80% stenosis of the proximal ICA. Vertebral arteries: Left dominant. Approximately 50% stenosis of the right vertebral artery origin secondary to atherosclerosis. Mild narrowing of the left vertebral artery origin secondary to predominately calcific atherosclerosis. Remainder of the vertebral arteries are patent bilaterally. z Skeleton: Mild multilevel degenerative disc disease. Other neck: No mass or suspicious adenopathy. Upper chest: Visualized lung apices are clear. Partially imaged median sternotomy. Review of the MIP images confirms the above findings CTA HEAD FINDINGS Anterior circulation: Bilateral calcific atherosclerosis of the cavernous  and paraclinoid ICAs with approximately 60% stenosis of the left cavernous ICA. No evidence of greater than 50% stenosis on the right. Bilateral MCA and ACAs are patent without evidence of proximal hemodynamically significant stenosis. 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 Trifurcation of the ACA. No aneurysm identified. Posterior circulation: Bilateral intradural vertebral arteries and basilar artery are patent with mild narrowing of the right vertebral artery at its dural margin. Left fetal type PCA. Bilateral PCAs are patent without evidence of proximal hemodynamically significant stenosis. No aneurysm identified Venous sinuses: As permitted by contrast timing, patent. Anatomic variants: As detailed above Review of the MIP images confirms the above findings IMPRESSION: CTA Head: 1. No emergent large vessel occlusion. 2. 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 CTA Neck: 1. Approximately 70-80% stenosis of the proximal internal carotid artery just distal to the carotid bifurcation. 2. Approximately 50% stenosis of the right vertebral artery origin. Electronically Signed   By: FMargaretha SheffieldMD   On: 04/03/2021 08:25   CT HEAD WO CONTRAST  Result Date:  04/03/2021 CLINICAL DATA:  80 year old male with numbness tingling, paresthesia. EXAM: CT HEAD WITHOUT CONTRAST TECHNIQUE: Contiguous axial images were obtained from the base of the skull through the vertex without intravenous contrast. COMPARISON:  Brain MRI 06/04/2009.  Head CT 06/03/2009. FINDINGS: Brain: No midline shift, mass effect, or evidence of intracranial mass lesion. No ventriculomegaly. No acute intracranial hemorrhage identified. Patchy and confluent hypodensity in the bilateral cerebral white matter, deep gray matter nuclei. This is mildly progressed since 2010. No cortically based acute infarct identified. No cortical encephalomalacia identified. Vascular:  Calcified atherosclerosis at the skull base. No suspicious intracranial vascular hyperdensity. Skull: Stable.  No acute osseous abnormality identified. Sinuses/Orbits: Visualized paranasal sinuses and mastoids are stable and well pneumatized. Other: Tiny right forehead scalp lipoma (benign series 4, image 37). Postoperative changes to both globes. No acute orbit or scalp soft tissue finding. IMPRESSION: 1. No acute intracranial abnormality identified. 2. Advanced chronic small vessel disease. Mild progression since 2010. Electronically Signed   By: Genevie Ann M.D.   On: 04/03/2021 06:02   MR BRAIN WO CONTRAST  Result Date: 04/03/2021 CLINICAL DATA:  TIA. Left facial numbness and left leg weakness, now resolved. EXAM: MRI HEAD WITHOUT CONTRAST TECHNIQUE: Multiplanar, multiecho pulse sequences of the brain and surrounding structures were obtained without intravenous contrast. COMPARISON:  Head CT and CTA 04/03/2021.  Head MRI 06/04/2009. FINDINGS: The study is mildly motion degraded. Brain: There is an 8 mm focus of mild trace diffusion weighted signal hyperintensity laterally in the right thalamus with the suggestion of subtly reduced ADC (most conspicuous on the coronal ADC map) likely reflecting an acute infarct with this clinical history. Patchy to confluent T2 hyperintensities in the cerebral white matter bilaterally have mildly progressed from the prior MRI and are nonspecific but compatible with moderately severe chronic small vessel ischemic disease. Chronic lacunar infarcts are again noted in the deep cerebral white matter bilaterally, right basal ganglia, left thalamus, and pons. There is mild generalized cerebral atrophy. No intracranial hemorrhage, mass, midline shift, or extra-axial fluid collection is identified. Vascular: Major intracranial vascular flow voids are preserved. Skull and upper cervical spine: Unremarkable bone marrow signal. Sinuses/Orbits: Unremarkable orbits. Trace right mastoid  effusion. Clear paranasal sinuses. Other: Small right frontal scalp lipoma. IMPRESSION: 1. Suspected small acute right thalamic infarct. 2. 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 Electronically Signed   By: Logan Bores M.D.   On: 04/03/2021 11:02   ECHOCARDIOGRAM COMPLETE  Result Date: 04/03/2021    ECHOCARDIOGRAM REPORT   Patient Name:   KOHEI ANTONELLIS Date of Exam: 04/03/2021 Medical Rec #:  [id number]        Height:       72.0 in Accession #:    [id number]       Weight:       231.0 lb Date of Birth:  [birth year]       BSA:          2.265 m Patient Age:    51 years         BP:           159/76 mmHg Patient Gender: M                HR:           71 bpm. Exam Location:  Inpatient Procedure: 2D Echo, Cardiac Doppler and Color Doppler Indications:    TIA  History:        Patient has no  prior history of Echocardiogram examinations.                 CAD, TIA; Risk Factors:Hypertension and Dyslipidemia.  Sonographer:    [place] Referring Phys: [id number] [place]  1. Left ventricular ejection fraction, by estimation, is 60 to 65%. The left ventricle has normal function. The left ventricle has no regional wall motion abnormalities. Left ventricular diastolic parameters are indeterminate.  2. Right ventricular systolic function is normal. The right ventricular size is normal.  3. The mitral valve is normal in structure. No evidence of mitral valve regurgitation. No evidence of mitral stenosis.  4. The aortic valve is tricuspid. There is mild calcification of the aortic valve. Aortic valve regurgitation is not visualized. Mild to moderate aortic valve sclerosis/calcification is present, without any evidence of aortic stenosis.  5. The inferior vena cava is normal in size with greater than 50% respiratory variability, suggesting right atrial pressure of 3 mmHg. FINDINGS  Left Ventricle: Left ventricular ejection fraction, by estimation, is 60 to  65%. The left ventricle has normal function. The left ventricle has no regional wall motion abnormalities. The left ventricular internal cavity size was normal in size. There is  no left ventricular hypertrophy. Left ventricular diastolic parameters are indeterminate. Right Ventricle: The right ventricular size is normal. No increase in right ventricular wall thickness. Right ventricular systolic function is normal. Left Atrium: Left atrial size was normal in size. Right Atrium: Right atrial size was normal in size. Pericardium: There is no evidence of pericardial effusion. Mitral Valve: The mitral valve is normal in structure. No evidence of mitral valve regurgitation. No evidence of mitral valve stenosis. Tricuspid Valve: The tricuspid valve is normal in structure. Tricuspid valve regurgitation is trivial. No evidence of tricuspid stenosis. Aortic Valve: The aortic valve is tricuspid. There is mild calcification of the aortic valve. Aortic valve regurgitation is not visualized. Mild to moderate aortic valve sclerosis/calcification is present, without any evidence of aortic stenosis. Aortic valve mean gradient measures 3.5 mmHg. Aortic valve peak gradient measures 6.1 mmHg. Aortic valve area, by VTI measures 2.12 cm. Pulmonic Valve: The pulmonic valve was normal in structure. Pulmonic valve regurgitation is mild. No evidence of pulmonic stenosis. Aorta: The aortic root is normal in size and structure. Venous: The inferior vena cava is normal in size with greater than 50% respiratory variability, suggesting right atrial pressure of 3 mmHg. IAS/Shunts: The interatrial septum was not well visualized.  LEFT VENTRICLE PLAX 2D LVIDd:         4.60 cm LVIDs:         3.40 cm LV PW:         1.30 cm LV IVS:        1.40 cm LVOT diam:     1.80 cm LV SV:         53 LV SV Index:   23 LVOT Area:     2.54 cm  LV Volumes (MOD) LV vol d, MOD A2C: 35.9 ml LV vol d, MOD A4C: 70.2 ml LV vol s, MOD A4C: 24.8 ml LV SV MOD A4C:     70.2  ml RIGHT VENTRICLE TAPSE (M-mode): 1.2 cm LEFT ATRIUM             Index       RIGHT ATRIUM           Index LA Vol (A2C):   59.6 ml 26.31 ml/m RA Area:     13.10 cm LA Vol (  A4C):   52.6 ml 23.22 ml/m RA Volume:   28.50 ml  12.58 ml/m LA Biplane Vol: 57.4 ml 25.34 ml/m  AORTIC VALVE                   PULMONIC VALVE AV Area (Vmax):    1.90 cm    PV Vmax:       0.97 m/s AV Area (Vmean):   1.98 cm    PV Vmean:      71.800 cm/s AV Area (VTI):     2.12 cm    PV VTI:        0.216 m AV Vmax:           123.50 cm/s PV Peak grad:  3.8 mmHg AV Vmean:          88.350 cm/s PV Mean grad:  2.0 mmHg AV VTI:            0.249 m AV Peak Grad:      6.1 mmHg AV Mean Grad:      3.5 mmHg LVOT Vmax:         92.30 cm/s LVOT Vmean:        68.700 cm/s LVOT VTI:          0.207 m LVOT/AV VTI ratio: 0.83  AORTA Ao Root diam: 3.40 cm Ao Asc diam:  2.90 cm MITRAL VALVE MV Area (PHT): 3.85 cm    SHUNTS MV Decel Time: 197 msec    Systemic VTI:  0.21 m MV E velocity: 70.40 cm/s  Systemic Diam: 1.80 cm MV A velocity: 77.10 cm/s MV E/A ratio:  0.91 [name] MD Electronically signed by [name] MD Signature Date/Time: 04/03/2021/3:27:08 PM    Final      Subjective: No new complaints.   Discharge Exam: Vitals:   04/04/21 2315 04/05/21 0345  BP: (!) 151/68 (!) 148/71  Pulse: 66 62  Resp: 18 18  Temp: 98.7 F (37.1 C) 98.6 F (37 C)  SpO2: 93% 93%   Vitals:   04/04/21 1524 04/04/21 2039 04/04/21 2315 04/05/21 0345  BP: (!) 163/67 (!) 149/61 (!) 151/68 (!) 148/71  Pulse: 60 63 66 62  Resp: '18 18 18 18  ' Temp: 97.8 F (36.6 C) 98.7 F (37.1 C) 98.7 F (37.1 C) 98.6 F (37 C)  TempSrc: Oral Oral Oral Oral  SpO2: 95% 92% 93% 93%  Weight:        General: Pt is alert, awake, not in acute distress Cardiovascular: RRR, S1/S2 +, no rubs, no gallops Respiratory: CTA bilaterally, no wheezing, no rhonchi Abdominal: Soft, NT, ND, bowel sounds + Extremities: no edema, no cyanosis    The results of significant  diagnostics from this hospitalization (including imaging, microbiology, ancillary and laboratory) are listed below for reference.     Microbiology: Recent Results (from the past 240 hour(s))  Resp Panel by RT-PCR (Flu A&B, Covid) Nasopharyngeal Swab     Status: None   Collection Time: 04/03/21  5:54 AM   Specimen: Nasopharyngeal Swab; Nasopharyngeal(NP) swabs in vial transport medium  Result Value Ref Range Status   SARS Coronavirus 2 by RT PCR NEGATIVE NEGATIVE Final    Comment: (NOTE) SARS-CoV-2 target nucleic acids are NOT DETECTED.  The SARS-CoV-2 RNA is generally detectable in upper respiratory specimens during the acute phase of infection. The lowest concentration of SARS-CoV-2 viral copies this assay can detect is 138 copies/mL. A negative result does not preclude SARS-Cov-2 infection and should not be used as the  sole basis for treatment or other patient management decisions. A negative result may occur with  improper specimen collection/handling, submission of specimen other than nasopharyngeal swab, presence of viral mutation(s) within the areas targeted by this assay, and inadequate number of viral copies(<138 copies/mL). A negative result must be combined with clinical observations, patient history, and epidemiological information. The expected result is Negative.  Fact Sheet for Patients:  EntrepreneurPulse.com.au  Fact Sheet for Healthcare Providers:  IncredibleEmployment.be  This test is no t yet approved or cleared by the [place] FDA and  has been authorized for detection and/or diagnosis of SARS-CoV-2 by FDA under an Emergency Use Authorization (EUA). This EUA will remain  in effect (meaning this test can be used) for the duration of the COVID-19 declaration under Section 564(b)(1) of the Act, 21 U.S.C.section 360bbb-3(b)(1), unless the authorization is terminated  or revoked sooner.       Influenza A by PCR NEGATIVE  NEGATIVE Final   Influenza B by PCR NEGATIVE NEGATIVE Final    Comment: (NOTE) The Xpert Xpress SARS-CoV-2/FLU/RSV plus assay is intended as an aid in the diagnosis of influenza from Nasopharyngeal swab specimens and should not be used as a sole basis for treatment. Nasal washings and aspirates are unacceptable for Xpert Xpress SARS-CoV-2/FLU/RSV testing.  Fact Sheet for Patients: EntrepreneurPulse.com.au  Fact Sheet for Healthcare Providers: IncredibleEmployment.be  This test is not yet approved or cleared by the [place] FDA and has been authorized for detection and/or diagnosis of SARS-CoV-2 by FDA under an Emergency Use Authorization (EUA). This EUA will remain in effect (meaning this test can be used) for the duration of the COVID-19 declaration under Section 564(b)(1) of the Act, 21 U.S.C. section 360bbb-3(b)(1), unless the authorization is terminated or revoked.  Performed at [facility], [place] [address], [place], [place] [address]      Labs: BNP (last 3 results) No results for input(s): BNP in the last 8760 hours. Basic Metabolic Panel: Recent Labs  Lab 04/03/21 0515 04/04/21 0835 04/05/21 0336  NA 137 138 138  K 3.5 3.7 3.9  CL 98 104 105  CO2 '29 28 28  ' GLUCOSE 190* 119* 119*  BUN '14 10 10  ' CREATININE 1.25* 1.10 1.09  CALCIUM 8.7* 8.2* 8.3*  MG  --   --  2.0   Liver Function Tests: Recent Labs  Lab 04/03/21 0515 04/04/21 0835  AST 26 22  ALT 26 22  ALKPHOS 68 64  BILITOT 0.9 1.4*  PROT 6.2* 5.8*  ALBUMIN 3.3* 3.1*   No results for input(s): LIPASE, AMYLASE in the last 168 hours. No results for input(s): AMMONIA in the last 168 hours. CBC: Recent Labs  Lab 04/03/21 0515 04/04/21 0835 04/05/21 0336  WBC 6.2 6.7 6.7  NEUTROABS 4.1 4.6 4.3  HGB 16.0 15.8 14.9  HCT 46.6 45.0 42.4  MCV 101.3* 102.0* 101.7*  PLT 251 216 193   Cardiac Enzymes: No results for input(s): CKTOTAL, CKMB, CKMBINDEX,  TROPONINI in the last 168 hours. BNP: Invalid input(s): POCBNP CBG: No results for input(s): GLUCAP in the last 168 hours. D-Dimer No results for input(s): DDIMER in the last 72 hours. Hgb A1c Recent Labs    04/03/21 1111  HGBA1C 6.7*   Lipid Profile Recent Labs    04/03/21 1111  CHOL 143  HDL 31*  LDLCALC 86  TRIG 130  CHOLHDL 4.6   Thyroid function studies Recent Labs    04/03/21 1111  TSH 2.483   Anemia work up  No results for input(s): VITAMINB12, FOLATE, FERRITIN, TIBC, IRON, RETICCTPCT in the last 72 hours. Urinalysis    Component Value Date/Time   COLORURINE STRAW (A) 04/03/2021 0515   APPEARANCEUR CLEAR 04/03/2021 0515   LABSPEC 1.004 (L) 04/03/2021 0515   PHURINE 6.0 04/03/2021 0515   GLUCOSEU NEGATIVE 04/03/2021 0515   GLUCOSEU 100 mg/dL (A) 01/31/2009 1003   HGBUR NEGATIVE 04/03/2021 0515   BILIRUBINUR NEGATIVE 04/03/2021 0515   KETONESUR NEGATIVE 04/03/2021 0515   PROTEINUR NEGATIVE 04/03/2021 0515   UROBILINOGEN 0.2 08/18/2015 0905   NITRITE NEGATIVE 04/03/2021 0515   LEUKOCYTESUR NEGATIVE 04/03/2021 0515   Sepsis Labs Invalid input(s): PROCALCITONIN,  WBC,  LACTICIDVEN Microbiology Recent Results (from the past 240 hour(s))  Resp Panel by RT-PCR (Flu A&B, Covid) Nasopharyngeal Swab     Status: None   Collection Time: 04/03/21  5:54 AM   Specimen: Nasopharyngeal Swab; Nasopharyngeal(NP) swabs in vial transport medium  Result Value Ref Range Status   SARS Coronavirus 2 by RT PCR NEGATIVE NEGATIVE Final    Comment: (NOTE) SARS-CoV-2 target nucleic acids are NOT DETECTED.  The SARS-CoV-2 RNA is generally detectable in upper respiratory specimens during the acute phase of infection. The lowest concentration of SARS-CoV-2 viral copies this assay can detect is 138 copies/mL. A negative result does not preclude SARS-Cov-2 infection and should not be used as the sole basis for treatment or other patient management decisions. A negative result may  occur with  improper specimen collection/handling, submission of specimen other than nasopharyngeal swab, presence of viral mutation(s) within the areas targeted by this assay, and inadequate number of viral copies(<138 copies/mL). A negative result must be combined with clinical observations, patient history, and epidemiological information. The expected result is Negative.  Fact Sheet for Patients:  EntrepreneurPulse.com.au  Fact Sheet for Healthcare Providers:  IncredibleEmployment.be  This test is no t yet approved or cleared by the [place] FDA and  has been authorized for detection and/or diagnosis of SARS-CoV-2 by FDA under an Emergency Use Authorization (EUA). This EUA will remain  in effect (meaning this test can be used) for the duration of the COVID-19 declaration under Section 564(b)(1) of the Act, 21 U.S.C.section 360bbb-3(b)(1), unless the authorization is terminated  or revoked sooner.       Influenza A by PCR NEGATIVE NEGATIVE Final   Influenza B by PCR NEGATIVE NEGATIVE Final    Comment: (NOTE) The Xpert Xpress SARS-CoV-2/FLU/RSV plus assay is intended as an aid in the diagnosis of influenza from Nasopharyngeal swab specimens and should not be used as a sole basis for treatment. Nasal washings and aspirates are unacceptable for Xpert Xpress SARS-CoV-2/FLU/RSV testing.  Fact Sheet for Patients: EntrepreneurPulse.com.au  Fact Sheet for Healthcare Providers: IncredibleEmployment.be  This test is not yet approved or cleared by the [place] FDA and has been authorized for detection and/or diagnosis of SARS-CoV-2 by FDA under an Emergency Use Authorization (EUA). This EUA will remain in effect (meaning this test can be used) for the duration of the COVID-19 declaration under Section 564(b)(1) of the Act, 21 U.S.C. section 360bbb-3(b)(1), unless the authorization is terminated  or revoked.  Performed at [facility], [place] [address] [place], [place] [address]      Time coordinating discharge: 32 minutes.   SIGNED:   Hosie Poisson, MD  [facility] 04/05/2021, 8:13 AM

## 2021-04-05 NOTE — Progress Notes (Signed)
SLP Cancellation Note  Patient Details Name: [name] [name] MRN: [id number] DOB: Jul 02, 1941   Cancelled treatment:       Reason Eval/Treat Not Completed: Other (comment) Attempted to see pt for a cognitive evaluation but he had just started working with OT. Will f/u as able.    Osie Bond., M.A. [facility] Pager [phone] Office [phone]  04/05/2021, 2:51 PM

## 2021-04-05 NOTE — Progress Notes (Signed)
Physical Therapy Treatment Patient Details Name: [name] [name] MRN: [id number] DOB: 05-16-1941 Today's Date: 04/05/2021    History of Present Illness Pt is a 80 y/o male admitted 4/4 secondary to numbness in LLE, L cheek and L fingers. 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 PMH includes HTN, CAD s/p CABG, and DM.    PT Comments    Pt eager to mobilize upon PT arrival to room. Pt demonstrating improved mobility today, ambulating short hallway distance with use of RW and overall requiring min assist for steadying. Pt requires max cuing for form and improving gait parameters during mobility, but carries over all cues from PT well. PT challenged pt's static and dynamic balance via sit<>stands and standing marches without UE support, pt demonstrating marked unsteadiness and is fearful of losing balance. PT continuing to recommend CIR post-acutely to address mobility deficits, will continue to follow acutely.    Follow Up Recommendations  CIR     Equipment Recommendations  Rolling walker with 5" wheels;3in1 (PT)    Recommendations for Other Services Rehab consult     Precautions / Restrictions Precautions Precautions: Fall Restrictions Weight Bearing Restrictions: No    Mobility  Bed Mobility Overal bed mobility: Needs Assistance Bed Mobility: Sit to Supine       Sit to supine: Min assist;HOB elevated   General bed mobility comments: min assist for LE lifting back into bed.    Transfers Overall transfer level: Needs assistance Equipment used: Rolling walker (2 wheeled);None Transfers: Sit to/from Stand Sit to Stand: Min assist;From elevated surface         General transfer comment: min assist for power up, rise, and steadying upon standing, pt demonstrating difficulty transitioning hands from recliner to/from RW. STS multiple times throughout session as intervention.  Ambulation/Gait Ambulation/Gait assistance: Min assist Gait Distance (Feet):  75 Feet Assistive device: Rolling walker (2 wheeled) Gait Pattern/deviations: Step-through pattern;Decreased stride length;Shuffle;Trunk flexed Gait velocity: decr   General Gait Details: min assist to steady and guide RW, especially initially, with cues for upright posture, increased foot clearance, larger steps, and placement within middle of RW as pt standing towards R of RW.   Stairs             Wheelchair Mobility    Modified Rankin (Stroke Patients Only) Modified Rankin (Stroke Patients Only) Pre-Morbid Rankin Score: No symptoms Modified Rankin: Moderately severe disability     Balance Overall balance assessment: Needs assistance Sitting-balance support: Feet supported Sitting balance-Leahy Scale: Fair     Standing balance support: During functional activity;Single extremity supported Standing balance-Leahy Scale: Poor Standing balance comment: reliant on at least SL support in dynamic standing                            Cognition Arousal/Alertness: Awake/alert Behavior During Therapy: WFL for tasks assessed/performed Overall Cognitive Status: Impaired/Different from baseline Area of Impairment: Safety/judgement;Awareness;Problem solving                         Safety/Judgement: Decreased awareness of safety Awareness: Emergent Problem Solving: Slow processing;Requires verbal cues;Difficulty sequencing General Comments: Pt requiring cues for form and safety during mobility, but demos good carry over after initial cuing.      Exercises Other Exercises Other Exercises: sit to stand from recliner without UE support, x5, requiring min assist for steadying and slow eccentric lowering Other Exercises: Marching in place x10, with SL support  General Comments        Pertinent Vitals/Pain Pain Assessment: No/denies pain    Home Living                      Prior Function            PT Goals (current goals can now be found  in the care plan section) Acute Rehab PT Goals Patient Stated Goal: go to CIR PT Goal Formulation: With patient Time For Goal Achievement: 04/17/21 Potential to Achieve Goals: Good Progress towards PT goals: Progressing toward goals    Frequency    Min 4X/week      PT Plan Current plan remains appropriate    Co-evaluation              AM-PAC PT "6 Clicks" Mobility   Outcome Measure  Help needed turning from your back to your side while in a flat bed without using bedrails?: A Little Help needed moving from lying on your back to sitting on the side of a flat bed without using bedrails?: A Little Help needed moving to and from a bed to a chair (including a wheelchair)?: A Little Help needed standing up from a chair using your arms (e.g., wheelchair or bedside chair)?: A Little Help needed to walk in hospital room?: A Little Help needed climbing 3-5 steps with a railing? : A Lot 6 Click Score: 17    End of Session Equipment Utilized During Treatment: Gait belt Activity Tolerance: Patient tolerated treatment well Patient left: with call bell/phone within reach;in bed;with bed alarm set;with family/visitor present Nurse Communication: Mobility status PT Visit Diagnosis: Unsteadiness on feet (R26.81);Muscle weakness (generalized) (M62.81);Difficulty in walking, not elsewhere classified (R26.2)     Time: 1245-1310 PT Time Calculation (min) (ACUTE ONLY): 25 min  Charges:  $Gait Training: 8-22 mins $Neuromuscular Re-education: 8-22 mins                     Stacie Glaze, PT Acute Rehabilitation Services Pager [phone]  Office [phone]  [place] 04/05/2021, 4:00 PM

## 2021-04-05 NOTE — Progress Notes (Signed)
Occupational Therapy Treatment Patient Details Name: [name] [name] MRN: [id number] DOB: 1941/07/13 Today's Date: 04/05/2021    History of present illness Pt is a 80 y/o male admitted 4/4 secondary to numbness in LLE, L cheek and L fingers. 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 PMH includes HTN, CAD s/p CABG, and DM.   OT comments  Pt making steady progress towards OT goals this session. Pt making good progress with LUE strength and [place] coordination, issued pt LUE [place] HEP to assist with strength and AROM for higher level grooming tasks. Pt completed therex as indicated below with good carryover. Pt additionally completed household distance functional mobility with RW and MIN A. Pt required assist for balance ( especially during transitional turns when turning to L) and cues to manage RW as pt wanting to p/u Rw as pt ambulated. Pt would continue to benefit from skilled occupational therapy while admitted and after d/c to address the below listed limitations in order to improve overall functional mobility and facilitate independence with BADL participation. DC plan remains appropriate, will follow acutely per POC.     Follow Up Recommendations  CIR;Supervision/Assistance - 24 hour    Equipment Recommendations  3 in 1 bedside commode;Other (comment) (RW)    Recommendations for Other Services      Precautions / Restrictions Precautions Precautions: Fall Restrictions Weight Bearing Restrictions: No       Mobility Bed Mobility Overal bed mobility: Needs Assistance Bed Mobility: Supine to Sit;Sit to Supine     Supine to sit: Supervision;HOB elevated Sit to supine: Min guard;HOB elevated   General bed mobility comments: supervision - min guard assist for bed mobility, assist needed mostly for line mgmt and safety    Transfers Overall transfer level: Needs assistance Equipment used: Rolling walker (2 wheeled) Transfers: Sit to/from Stand Sit to Stand: Min  guard         General transfer comment: min guard assist to rise from EOB, good carryover of hand placement from previous PT session    Balance Overall balance assessment: Needs assistance Sitting-balance support: Feet supported Sitting balance-Leahy Scale: Fair Sitting balance - Comments: sitting EOB with no UE support with supervision   Standing balance support: During functional activity;Bilateral upper extremity supported Standing balance-Leahy Scale: Poor Standing balance comment: reliant on BUE support for mobility                           ADL either performed or assessed with clinical judgement   ADL Overall ADL's : Needs assistance/impaired             Lower Body Bathing: Min guard;Sitting/lateral leans Lower Body Bathing Details (indicate cue type and reason): simulated via LB dressing from EOB     Lower Body Dressing: Min guard;Sitting/lateral leans Lower Body Dressing Details (indicate cue type and reason): to adjust socks from EOB Toilet Transfer: Minimal assistance;Ambulation;RW Toilet Transfer Details (indicate cue type and reason): simulated via functional mobiltiy with RW and MIN A +1 for balance, cues needed for RW mgmt as pt wanting to pick up RW during ambulation         Functional mobility during ADLs: Minimal assistance;Rolling walker;Cueing for safety General ADL Comments: pt with improvements in LUE strength and coordination, pt continues to present with balance impairments     Vision Baseline Vision/History: Cataracts (previous cataract surgery) Patient Visual Report: No change from baseline     Perception  Praxis      Cognition Arousal/Alertness: Awake/alert Behavior During Therapy: WFL for tasks assessed/performed Overall Cognitive Status: Within Functional Limits for tasks assessed Area of Impairment: Safety/judgement;Awareness;Problem solving                         Safety/Judgement: Decreased awareness of  safety Awareness: Emergent Problem Solving: Slow processing;Requires verbal cues;Difficulty sequencing General Comments: overall WFL for simple mobiltiy tasks        Exercises Hand Exercises Digit Composite Flexion: AROM;Left;5 reps;Seated Composite Extension: AROM;Left;5 reps;Seated Digit Composite Abduction: AROM;Left;5 reps;Seated Digit Composite Adduction: AROM;Left;5 reps;Seated Digit Lifts: AROM;Left;5 reps;Seated Opposition: AROM;Left;Seated;5 reps Other Exercises Other Exercises: issued pt level 1 theraputty with HEP including composite digit flexion/ extension, wrist supination/pronation, pincer grasp Other Exercises: Marching in place x10, with SL support   Shoulder Instructions       General Comments pts wife present during session, issued pt level 1 theraputty and [facility] HEP    Pertinent Vitals/ Pain       Pain Assessment: No/denies pain  Home Living                                          Prior Functioning/Environment              Frequency  Min 2X/week        Progress Toward Goals  OT Goals(current goals can now be found in the care plan section)  Progress towards OT goals: Progressing toward goals  Acute Rehab OT Goals Patient Stated Goal: go to CIR OT Goal Formulation: With patient Time For Goal Achievement: 04/18/21 Potential to Achieve Goals: Good  Plan Discharge plan remains appropriate;Frequency remains appropriate    Co-evaluation                 AM-PAC OT "6 Clicks" Daily Activity     Outcome Measure   Help from another person eating meals?: None Help from another person taking care of personal grooming?: A Little Help from another person toileting, which includes using toliet, bedpan, or urinal?: A Lot Help from another person bathing (including washing, rinsing, drying)?: A Lot Help from another person to put on and taking off regular upper body clothing?: A Little Help from another person to put on and  taking off regular lower body clothing?: A Little 6 Click Score: 17    End of Session Equipment Utilized During Treatment: Gait belt;Rolling walker;Other (comment) (level 1 theraputty)  OT Visit Diagnosis: Other abnormalities of gait and mobility (R26.89);Muscle weakness (generalized) (M62.81);Other symptoms and signs involving the nervous system (R29.898)   Activity Tolerance Patient tolerated treatment well   Patient Left in bed;with call bell/phone within reach;with bed alarm set;with family/visitor present   Nurse Communication Mobility status        Time: 1450-1529 OT Time Calculation (min): 39 min  Charges: OT General Charges $OT Visit: 1 Visit OT Treatments $Therapeutic Activity: 38-52 mins  Harley Alto., COTA/L Acute Rehabilitation Services [phone]    Precious Haws 04/05/2021, 4:41 PM

## 2021-04-05 NOTE — H&P (Incomplete)
Physical Medicine and Rehabilitation Admission H&P     HPI: [name] [name] is a 80 year old right-handed male with history of TIA, hypertension, hyperlipidemia, CAD with CABG 2005 maintained on aspirin, right CEA and questionable medical compliance.  Per chart review patient lives with spouse.  Independent prior to admission using an assistive device.  1 level home 3 steps to entry.  Presented 04/03/2021 with acute onset of left-sided weakness.  Cranial CT scan negative for acute changes.  CT angiogram of head and neck showed no emergent large vessel occlusion.  Approximately 50% stenosis of the left cavernous internal carotid artery and approximately 70 to 80% stenosis of the proximal internal carotid artery just distal to the carotid bifurcation.  MRI of the brain showed small acute right thalamic infarction.  Echocardiogram with ejection fraction of 60 to 65% no wall motion abnormalities.  Admission chemistries unremarkable except glucose 190 creatinine 1.25 alcohol negative urine drug screen negative.  Currently maintained on aspirin Plavix for CVA prophylaxis x3 weeks then Plavix alone.  Subcutaneous Lovenox for DVT prophylaxis.  Tolerating a regular diet.  Therapy evaluations completed due to patient's decreased functional ability left side weakness recommendations of physical medicine rehab consult patient was admitted for a comprehensive rehab program.  Review of Systems  Constitutional: Negative for chills and fever.  HENT: Negative for hearing loss.   Eyes: Negative for blurred vision and double vision.  Respiratory: Negative for cough and shortness of breath.   Cardiovascular: Negative for chest pain, palpitations and leg swelling.  Gastrointestinal: Positive for constipation. Negative for heartburn, nausea and vomiting.  Genitourinary: Negative for dysuria, flank pain and hematuria.  Musculoskeletal: Positive for myalgias.  Skin: Negative for rash.  Neurological: Positive for  dizziness and weakness.  All other systems reviewed and are negative.  Past Medical History:  Diagnosis Date  . Basal cell carcinoma 11/09/2019   nod & infil-behind right ear-cx3 &exc  . Basal cell carcinoma 03/21/2020   Residual BCC with peripheral margin involved - ST recommends MOHs  . Carotid artery occlusion   . Coronary artery disease    s/p CABG 2005; sees Dr Johnsie Cancel yearly  . Gynecomastia   . Hypercholesterolemia   . Hypertension   . Neurodermatitis   . Overweight(278.02)   . Personal history of colonic polyps 10/08/2006   tubular adenomas  . Renal insufficiency   . Transient ischemic attack 2010   "lasted ~ 5 seconds"  . Vitamin D deficiency    Past Surgical History:  Procedure Laterality Date  . CARDIAC CATHETERIZATION  02/2004   "tried to stent; couldn't"  . CAROTID ENDARTERECTOMY Right 08/26/2015  . CORONARY ANGIOPLASTY    . CORONARY ARTERY BYPASS GRAFT  Feb. 2005   4 vessel  . ENDARTERECTOMY Right 08/26/2015   Procedure: Right Carotid ENDARTERECTOMY with Patch Angioplasty ;  Surgeon: Rosetta Posner, MD;  Location: [facility];  Service: Vascular;  Laterality: Right;  . 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 Dessie Coma  . KELOID EXCISION    . PILONIDAL CYST EXCISION  1989   Family History  Problem Relation Age of Onset  . Heart disease Mother        Before age 35  . Diabetes Mother   . Kidney disease Mother   . Heart attack Mother 77  . Lung cancer Father 68  . Diabetes Brother   . Heart disease Brother   . Heart disease Brother   . Arthritis Brother   . Diabetes Sister   .  Fibromyalgia Sister   . Lung cancer Paternal Uncle        questionable as to if it was lung ca  . Healthy Daughter   . Colon cancer Neg Hx   . Stroke Neg Hx    Social History:  reports that he has never smoked. His smokeless tobacco use includes snuff. He reports that he does not drink alcohol and does not use drugs. Allergies:  Allergies  Allergen Reactions  . Niacin Other (See  Comments)    REACTION: intol to NIACIN w/ headaches REACTION: intol to NIACIN w/ headaches   Medications Prior to Admission  Medication Sig Dispense Refill  . atorvastatin (LIPITOR) 20 MG tablet TAKE 1/2 TABLET BY MOUTH EVERY DAY (Patient taking differently: Take 10 mg by mouth daily.) 45 tablet 3  . blood glucose meter kit and supplies KIT Dispense based on patient and insurance preference. Use up to four times daily as directed. (FOR ICD-9 250.00, 250.01). 1 each 0  . Cholecalciferol (VITAMIN D) 2000 UNITS CAPS Take 2,000 Units by mouth daily.    . hydrochlorothiazide (MICROZIDE) 12.5 MG capsule Take 1 capsule (12.5 mg total) by mouth daily. Please make yearly appt with Dr. Johnsie Cancel for April 2022 for future refills. Thank you 1st attempt (Patient taking differently: Take 12.5 mg by mouth daily.) 90 capsule 0  . losartan (COZAAR) 25 MG tablet TAKE 2 TABLETS BY MOUTH EVERY DAY (Patient taking differently: Take 50 mg by mouth daily.) 180 tablet 1  . nitroGLYCERIN (NITROSTAT) 0.4 MG SL tablet Place 1 tablet (0.4 mg total) under the tongue every 5 (five) minutes as needed for chest pain. 25 tablet 3  . vitamin B-12 (CYANOCOBALAMIN) 1000 MCG tablet Take 1,000 mcg by mouth daily.    . [DISCONTINUED] aspirin EC 81 MG tablet Take 1 tablet (81 mg total) by mouth daily.      Drug Regimen Review Drug regimen was reviewed and remains appropriate with no significant issues identified  Home: Home Living Family/patient expects to be discharged to:: Private residence Living Arrangements: Spouse/significant other Available Help at Discharge: Family,Available 24 hours/day Type of Home: House Home Access: Stairs to enter [employer] of Steps: 3 Entrance Stairs-Rails: None Home Layout: One level Bathroom Shower/Tub: Chiropodist: Handicapped height Bathroom Accessibility: Yes Home Equipment: Cane - single point,Walker - 2 wheels,Shower seat  Lives With: Spouse    Functional History: Prior Function Level of Independence: Independent with assistive device(s) Comments: Still driving. USed cane occasionally  Functional Status:  Mobility: Bed Mobility Overal bed mobility: Needs Assistance Bed Mobility: Sit to Supine Sit to supine: Min guard General bed mobility comments: recieved pt in recliner Transfers Overall transfer level: Needs assistance Equipment used: None Transfers: Sit to/from Stand Sit to Stand: Min assist General transfer comment: min assist to power up fully and steady, cueing for hand placement and safety; L lean without UE support Ambulation/Gait Ambulation/Gait assistance: Mod assist Gait Distance (Feet):  (Simulated march in place in front of recliner) Assistive device: None Gait Pattern/deviations: Staggering left General Gait Details: Heavy L drift and lean with tendency to lose balance; would fall without heavy mod assist Gait velocity: Decreased    ADL: ADL Overall ADL's : Needs assistance/impaired Grooming: Minimal assistance,Standing Upper Body Bathing: Set up,Sitting Lower Body Bathing: Minimal assistance,Sit to/from stand Upper Body Dressing : Set up,Sitting Lower Body Dressing: Moderate assistance,Sit to/from stand Lower Body Dressing Details (indicate cue type and reason): min assist to fully manage socks, min assist in standing Toilet  Transfer: Minimal Publishing rights manager Details (indicate cue type and reason): simulated to recliner Functional mobility during ADLs: Minimal assistance,Moderate assistance,Rolling walker,Cueing for safety General ADL Comments: pt limited by L weakness and decreased coordination, impaired balance, and safety awareness  Cognition: Cognition Overall Cognitive Status: Impaired/Different from baseline Orientation Level: Oriented X4 Cognition Arousal/Alertness: Awake/alert Behavior During Therapy: WFL for tasks assessed/performed Overall Cognitive Status:  Impaired/Different from baseline Area of Impairment: Safety/judgement,Awareness,Problem solving Safety/Judgement: Decreased awareness of safety,Decreased awareness of deficits Awareness: Emergent Problem Solving: Slow processing,Requires verbal cues,Difficulty sequencing General Comments: pt with decreased awareness to deficits and safety, cueing for problem solving; was clearer about his fall risk after undergoing berg Balance Assessment  Physical Exam: Blood pressure (!) 148/71, pulse 62, temperature 98.6 F (37 C), temperature source Oral, resp. rate 18, weight 103.4 kg, SpO2 93 %. Physical Exam Neurological:     Comments: Patient is alert in no acute distress makes eye contact with examiner.  Left facial droop with drooling.  Oriented to person place and time.  Fair awareness of deficits.  Follows simple commands.     Results for orders placed or performed during the hospital encounter of 04/03/21 (from the past 48 hour(s))  Lipid panel     Status: Abnormal   Collection Time: 04/03/21 11:11 AM  Result Value Ref Range   Cholesterol 143 0 - 200 mg/dL   Triglycerides 130 <150 mg/dL   HDL 31 (L) >40 mg/dL   Total CHOL/HDL Ratio 4.6 RATIO   VLDL 26 0 - 40 mg/dL   LDL Cholesterol 86 0 - 99 mg/dL    Comment:        Total Cholesterol/HDL:CHD Risk Coronary Heart Disease Risk Table                     Men   Women  1/2 Average Risk   3.4   3.3  Average Risk       5.0   4.4  2 X Average Risk   9.6   7.1  3 X Average Risk  23.4   11.0        Use the calculated Patient Ratio above and the CHD Risk Table to determine the patient's CHD Risk.        ATP III CLASSIFICATION (LDL):  <100     mg/dL   Optimal  100-129  mg/dL   Near or Above                    Optimal  130-159  mg/dL   Borderline  160-189  mg/dL   High  >190     mg/dL   Very High Performed at [facility] [address] [place], [place] [address]   Hemoglobin A1c     Status: Abnormal   Collection Time: 04/03/21  11:11 AM  Result Value Ref Range   Hgb A1c MFr Bld 6.7 (H) 4.8 - 5.6 %    Comment: (NOTE) Pre diabetes:          5.7%-6.4%  Diabetes:              >6.4%  Glycemic control for   <7.0% adults with diabetes    Mean Plasma Glucose 145.59 mg/dL    Comment: Performed at [facility] [address], [place], [place] [address]  TSH     Status: None   Collection Time: 04/03/21 11:11 AM  Result Value Ref Range   TSH 2.483 0.350 -  4.500 uIU/mL    Comment: Performed by a 3rd Generation assay with a functional sensitivity of <=0.01 uIU/mL. Performed at [facility], [place] [address], [place], [place] [address]   Comprehensive metabolic panel     Status: Abnormal   Collection Time: 04/04/21  8:35 AM  Result Value Ref Range   Sodium 138 135 - 145 mmol/L   Potassium 3.7 3.5 - 5.1 mmol/L   Chloride 104 98 - 111 mmol/L   CO2 28 22 - 32 mmol/L   Glucose, Bld 119 (H) 70 - 99 mg/dL    Comment: Glucose reference range applies only to samples taken after fasting for at least 8 hours.   BUN 10 8 - 23 mg/dL   Creatinine, Ser 1.10 0.61 - 1.24 mg/dL   Calcium 8.2 (L) 8.9 - 10.3 mg/dL   Total Protein 5.8 (L) 6.5 - 8.1 g/dL   Albumin 3.1 (L) 3.5 - 5.0 g/dL   AST 22 15 - 41 U/L   ALT 22 0 - 44 U/L   Alkaline Phosphatase 64 38 - 126 U/L   Total Bilirubin 1.4 (H) 0.3 - 1.2 mg/dL   GFR, Estimated >60 >60 mL/min    Comment: (NOTE) Calculated using the CKD-EPI Creatinine Equation (2021)    Anion gap 6 5 - 15    Comment: Performed at [facility], [place] [address] [place], [place] [address]  CBC with Differential/Platelet     Status: Abnormal   Collection Time: 04/04/21  8:35 AM  Result Value Ref Range   WBC 6.7 4.0 - 10.5 K/uL   RBC 4.41 4.22 - 5.81 MIL/uL   Hemoglobin 15.8 13.0 - 17.0 g/dL   HCT 45.0 39.0 - 52.0 %   MCV 102.0 (H) 80.0 - 100.0 fL   MCH 35.8 (H) 26.0 - 34.0 pg   MCHC 35.1 30.0 - 36.0 g/dL   RDW 12.9 11.5 - 15.5 %   Platelets 216 150 - 400 K/uL   nRBC 0.0 0.0 - 0.2  %   Neutrophils Relative % 69 %   Neutro Abs 4.6 1.7 - 7.7 K/uL   Lymphocytes Relative 18 %   Lymphs Abs 1.2 0.7 - 4.0 K/uL   Monocytes Relative 10 %   Monocytes Absolute 0.7 0.1 - 1.0 K/uL   Eosinophils Relative 2 %   Eosinophils Absolute 0.2 0.0 - 0.5 K/uL   Basophils Relative 0 %   Basophils Absolute 0.0 0.0 - 0.1 K/uL   Immature Granulocytes 1 %   Abs Immature Granulocytes 0.06 0.00 - 0.07 K/uL    Comment: Performed at [facility] [address], [place], [place] [address]  Magnesium     Status: None   Collection Time: 04/05/21  3:36 AM  Result Value Ref Range   Magnesium 2.0 1.7 - 2.4 mg/dL    Comment: Performed at [facility] [address], [place],  [address]  CBC with Differential/Platelet     Status: Abnormal   Collection Time: 04/05/21  3:36 AM  Result Value Ref Range   WBC 6.7 4.0 - 10.5 K/uL   RBC 4.17 (L) 4.22 - 5.81 MIL/uL   Hemoglobin 14.9 13.0 - 17.0 g/dL   HCT 42.4 39.0 - 52.0 %   MCV 101.7 (H) 80.0 - 100.0 fL   MCH 35.7 (H) 26.0 - 34.0 pg   MCHC 35.1 30.0 - 36.0 g/dL   RDW 12.7 11.5 - 15.5 %   Platelets 193 150 - 400  K/uL   nRBC 0.0 0.0 - 0.2 %   Neutrophils Relative % 63 %   Neutro Abs 4.3 1.7 - 7.7 K/uL   Lymphocytes Relative 20 %   Lymphs Abs 1.3 0.7 - 4.0 K/uL   Monocytes Relative 12 %   Monocytes Absolute 0.8 0.1 - 1.0 K/uL   Eosinophils Relative 3 %   Eosinophils Absolute 0.2 0.0 - 0.5 K/uL   Basophils Relative 1 %   Basophils Absolute 0.0 0.0 - 0.1 K/uL   Immature Granulocytes 1 %   Abs Immature Granulocytes 0.07 0.00 - 0.07 K/uL    Comment: Performed at [facility] [address], [place], [place] [address]  Basic metabolic panel     Status: Abnormal   Collection Time: 04/05/21  3:36 AM  Result Value Ref Range   Sodium 138 135 - 145 mmol/L   Potassium 3.9 3.5 - 5.1 mmol/L   Chloride 105 98 - 111 mmol/L   CO2 28 22 - 32 mmol/L   Glucose, Bld 119 (H) 70 - 99 mg/dL    Comment: Glucose reference range applies  only to samples taken after fasting for at least 8 hours.   BUN 10 8 - 23 mg/dL   Creatinine, Ser 1.09 0.61 - 1.24 mg/dL   Calcium 8.3 (L) 8.9 - 10.3 mg/dL   GFR, Estimated >60 >60 mL/min    Comment: (NOTE) Calculated using the CKD-EPI Creatinine Equation (2021)    Anion gap 5 5 - 15    Comment: Performed at [facility] [address], [place], [place] [address]   MR BRAIN WO CONTRAST  Result Date: 04/03/2021 CLINICAL DATA:  TIA. Left facial numbness and left leg weakness, now resolved. EXAM: MRI HEAD WITHOUT CONTRAST TECHNIQUE: Multiplanar, multiecho pulse sequences of the brain and surrounding structures were obtained without intravenous contrast. COMPARISON:  Head CT and CTA 04/03/2021.  Head MRI 06/04/2009. FINDINGS: The study is mildly motion degraded. Brain: There is an 8 mm focus of mild trace diffusion weighted signal hyperintensity laterally in the right thalamus with the suggestion of subtly reduced ADC (most conspicuous on the coronal ADC map) likely reflecting an acute infarct with this clinical history. Patchy to confluent T2 hyperintensities in the cerebral white matter bilaterally have mildly progressed from the prior MRI and are nonspecific but compatible with moderately severe chronic small vessel ischemic disease. Chronic lacunar infarcts are again noted in the deep cerebral white matter bilaterally, right basal ganglia, left thalamus, and pons. There is mild generalized cerebral atrophy. No intracranial hemorrhage, mass, midline shift, or extra-axial fluid collection is identified. Vascular: Major intracranial vascular flow voids are preserved. Skull and upper cervical spine: Unremarkable bone marrow signal. Sinuses/Orbits: Unremarkable orbits. Trace right mastoid effusion. Clear paranasal sinuses. Other: Small right frontal scalp lipoma. IMPRESSION: 1. Suspected small acute right thalamic infarct. 2. Moderately severe chronic small vessel ischemic disease with multiple chronic  lacunar infarcts. Electronically Signed   By: Logan Bores M.D.   On: 04/03/2021 11:02   ECHOCARDIOGRAM COMPLETE  Result Date: 04/03/2021    ECHOCARDIOGRAM REPORT   Patient Name:   [name] [name] Date of Exam: 04/03/2021 Medical Rec #:  [id number]        Height:       72.0 in Accession #:    [id number]       Weight:       231.0 lb Date of Birth:  09-Feb-1941       BSA:  2.265 m Patient Age:    4 years         BP:           159/76 mmHg Patient Gender: M                HR:           71 bpm. Exam Location:  Inpatient Procedure: 2D Echo, Cardiac Doppler and Color Doppler Indications:    TIA  History:        Patient has no prior history of Echocardiogram examinations.                 CAD, TIA; Risk Factors:Hypertension and Dyslipidemia.  Sonographer:    [place] Referring Phys: [id number] [place]  1. Left ventricular ejection fraction, by estimation, is 60 to 65%. The left ventricle has normal function. The left ventricle has no regional wall motion abnormalities. Left ventricular diastolic parameters are indeterminate.  2. Right ventricular systolic function is normal. The right ventricular size is normal.  3. The mitral valve is normal in structure. No evidence of mitral valve regurgitation. No evidence of mitral stenosis.  4. The aortic valve is tricuspid. There is mild calcification of the aortic valve. Aortic valve regurgitation is not visualized. Mild to moderate aortic valve sclerosis/calcification is present, without any evidence of aortic stenosis.  5. The inferior vena cava is normal in size with greater than 50% respiratory variability, suggesting right atrial pressure of 3 mmHg. FINDINGS  Left Ventricle: Left ventricular ejection fraction, by estimation, is 60 to 65%. The left ventricle has normal function. The left ventricle has no regional wall motion abnormalities. The left ventricular internal cavity size was normal in size. There is  no left ventricular hypertrophy. Left  ventricular diastolic parameters are indeterminate. Right Ventricle: The right ventricular size is normal. No increase in right ventricular wall thickness. Right ventricular systolic function is normal. Left Atrium: Left atrial size was normal in size. Right Atrium: Right atrial size was normal in size. Pericardium: There is no evidence of pericardial effusion. Mitral Valve: The mitral valve is normal in structure. No evidence of mitral valve regurgitation. No evidence of mitral valve stenosis. Tricuspid Valve: The tricuspid valve is normal in structure. Tricuspid valve regurgitation is trivial. No evidence of tricuspid stenosis. Aortic Valve: The aortic valve is tricuspid. There is mild calcification of the aortic valve. Aortic valve regurgitation is not visualized. Mild to moderate aortic valve sclerosis/calcification is present, without any evidence of aortic stenosis. Aortic valve mean gradient measures 3.5 mmHg. Aortic valve peak gradient measures 6.1 mmHg. Aortic valve area, by VTI measures 2.12 cm. Pulmonic Valve: The pulmonic valve was normal in structure. Pulmonic valve regurgitation is mild. No evidence of pulmonic stenosis. Aorta: The aortic root is normal in size and structure. Venous: The inferior vena cava is normal in size with greater than 50% respiratory variability, suggesting right atrial pressure of 3 mmHg. IAS/Shunts: The interatrial septum was not well visualized.  LEFT VENTRICLE PLAX 2D LVIDd:         4.60 cm LVIDs:         3.40 cm LV PW:         1.30 cm LV IVS:        1.40 cm LVOT diam:     1.80 cm LV SV:         53 LV SV Index:   23 LVOT Area:     2.54 cm  LV Volumes (MOD) LV  vol d, MOD A2C: 35.9 ml LV vol d, MOD A4C: 70.2 ml LV vol s, MOD A4C: 24.8 ml LV SV MOD A4C:     70.2 ml RIGHT VENTRICLE TAPSE (M-mode): 1.2 cm LEFT ATRIUM             Index       RIGHT ATRIUM           Index LA Vol (A2C):   59.6 ml 26.31 ml/m RA Area:     13.10 cm LA Vol (A4C):   52.6 ml 23.22 ml/m RA Volume:    28.50 ml  12.58 ml/m LA Biplane Vol: 57.4 ml 25.34 ml/m  AORTIC VALVE                   PULMONIC VALVE AV Area (Vmax):    1.90 cm    PV Vmax:       0.97 m/s AV Area (Vmean):   1.98 cm    PV Vmean:      71.800 cm/s AV Area (VTI):     2.12 cm    PV VTI:        0.216 m AV Vmax:           123.50 cm/s PV Peak grad:  3.8 mmHg AV Vmean:          88.350 cm/s PV Mean grad:  2.0 mmHg AV VTI:            0.249 m AV Peak Grad:      6.1 mmHg AV Mean Grad:      3.5 mmHg LVOT Vmax:         92.30 cm/s LVOT Vmean:        68.700 cm/s LVOT VTI:          0.207 m LVOT/AV VTI ratio: 0.83  AORTA Ao Root diam: 3.40 cm Ao Asc diam:  2.90 cm MITRAL VALVE MV Area (PHT): 3.85 cm    SHUNTS MV Decel Time: 197 msec    Systemic VTI:  0.21 m MV E velocity: 70.40 cm/s  Systemic Diam: 1.80 cm MV A velocity: 77.10 cm/s MV E/A ratio:  0.91 [name] MD Electronically signed by [name] MD Signature Date/Time: 04/03/2021/3:27:08 PM    Final        Medical Problem List and Plan: 1.  Left facial droop and weakness with impaired balance secondary to right thalamic infarction likely small vessel disease  -patient may *** shower  -ELOS/Goals: *** 2.  Antithrombotics: -DVT/anticoagulation: Lovenox  -antiplatelet therapy: Aspirin 81 mg daily and Plavix 75 mg daily x3 weeks then Plavix alone 3. Pain Management: Tylenol as needed 4. Mood: Provide emotional support  -antipsychotic agents: N/A 5. Neuropsych: This patient is capable of making decisions on his own behalf. 6. Skin/Wound Care: Routine skin checks 7. Fluids/Electrolytes/Nutrition: Routine in and outs with follow-up chemistries 8.  Hypertension.  Coreg 3.125 mg twice daily.  Monitor with increased mobility 9.  Hyperlipidemia.  Lipitor 10.  History of CAD with CABG.  Continue aspirin and Plavix.  No chest pain or shortness of breath 11.  Questionable medical compliance.  Provide counseling  ***  Cathlyn Parsons, PA-C 04/05/2021

## 2021-04-06 ENCOUNTER — Inpatient Hospital Stay (HOSPITAL_COMMUNITY)
Admission: RE | Admit: 2021-04-06 | Discharge: 2021-04-12 | DRG: 057 | Disposition: A | Discharge status: Home / Self Care | Payer: Medicare PPO | Source: Intra-hospital | Attending: Physical Medicine & Rehabilitation | Admitting: Physical Medicine & Rehabilitation

## 2021-04-06 ENCOUNTER — Encounter (HOSPITAL_COMMUNITY): Payer: Self-pay | Admitting: Physical Medicine & Rehabilitation

## 2021-04-06 ENCOUNTER — Other Ambulatory Visit: Payer: Self-pay

## 2021-04-06 DIAGNOSIS — E785 Hyperlipidemia, unspecified: Secondary | ICD-10-CM | POA: Diagnosis not present

## 2021-04-06 DIAGNOSIS — Z720 Tobacco use: Secondary | ICD-10-CM

## 2021-04-06 DIAGNOSIS — Z7982 Long term (current) use of aspirin: Secondary | ICD-10-CM

## 2021-04-06 DIAGNOSIS — I639 Cerebral infarction, unspecified: Secondary | ICD-10-CM | POA: Diagnosis not present

## 2021-04-06 DIAGNOSIS — E119 Type 2 diabetes mellitus without complications: Secondary | ICD-10-CM | POA: Diagnosis not present

## 2021-04-06 DIAGNOSIS — I2510 Atherosclerotic heart disease of native coronary artery without angina pectoris: Secondary | ICD-10-CM | POA: Diagnosis not present

## 2021-04-06 DIAGNOSIS — I69390 Apraxia following cerebral infarction: Secondary | ICD-10-CM

## 2021-04-06 DIAGNOSIS — Z7902 Long term (current) use of antithrombotics/antiplatelets: Secondary | ICD-10-CM | POA: Diagnosis not present

## 2021-04-06 DIAGNOSIS — I69392 Facial weakness following cerebral infarction: Secondary | ICD-10-CM | POA: Diagnosis not present

## 2021-04-06 DIAGNOSIS — I6522 Occlusion and stenosis of left carotid artery: Secondary | ICD-10-CM | POA: Diagnosis not present

## 2021-04-06 DIAGNOSIS — Z8249 Family history of ischemic heart disease and other diseases of the circulatory system: Secondary | ICD-10-CM

## 2021-04-06 DIAGNOSIS — Z951 Presence of aortocoronary bypass graft: Secondary | ICD-10-CM

## 2021-04-06 DIAGNOSIS — Z8673 Personal history of transient ischemic attack (TIA), and cerebral infarction without residual deficits: Secondary | ICD-10-CM | POA: Diagnosis not present

## 2021-04-06 DIAGNOSIS — R2681 Unsteadiness on feet: Secondary | ICD-10-CM | POA: Diagnosis present

## 2021-04-06 DIAGNOSIS — Z85828 Personal history of other malignant neoplasm of skin: Secondary | ICD-10-CM | POA: Diagnosis not present

## 2021-04-06 DIAGNOSIS — I69393 Ataxia following cerebral infarction: Secondary | ICD-10-CM | POA: Diagnosis not present

## 2021-04-06 DIAGNOSIS — Z888 Allergy status to other drugs, medicaments and biological substances status: Secondary | ICD-10-CM

## 2021-04-06 DIAGNOSIS — I6381 Other cerebral infarction due to occlusion or stenosis of small artery: Secondary | ICD-10-CM

## 2021-04-06 DIAGNOSIS — I69354 Hemiplegia and hemiparesis following cerebral infarction affecting left non-dominant side: Principal | ICD-10-CM

## 2021-04-06 DIAGNOSIS — Z79899 Other long term (current) drug therapy: Secondary | ICD-10-CM

## 2021-04-06 DIAGNOSIS — I10 Essential (primary) hypertension: Secondary | ICD-10-CM | POA: Diagnosis not present

## 2021-04-06 LAB — CBC
HCT: 45.1 % (ref 39.0–52.0)
Hemoglobin: 15.9 g/dL (ref 13.0–17.0)
MCH: 35.1 pg — ABNORMAL HIGH (ref 26.0–34.0)
MCHC: 35.3 g/dL (ref 30.0–36.0)
MCV: 99.6 fL (ref 80.0–100.0)
Platelets: 204 10*3/uL (ref 150–400)
RBC: 4.53 MIL/uL (ref 4.22–5.81)
RDW: 12.6 % (ref 11.5–15.5)
WBC: 6.8 10*3/uL (ref 4.0–10.5)
nRBC: 0 % (ref 0.0–0.2)

## 2021-04-06 LAB — CREATININE, SERUM
Creatinine, Ser: 1.04 mg/dL (ref 0.61–1.24)
GFR, Estimated: >60 mL/min (ref >60)

## 2021-04-06 MED ORDER — CLOPIDOGREL BISULFATE 75 MG PO TABS
75.0000 mg | ORAL_TABLET | Freq: Every day | ORAL | Status: DC
  Administered 2021-04-07 – 2021-04-12 (×6): 75 mg via ORAL
  Filled 2021-04-06 (×6): qty 1

## 2021-04-06 MED ORDER — HYDROCERIN EX CREA
1.0000 "application " | TOPICAL_CREAM | Freq: Two times a day (BID) | CUTANEOUS | Status: DC
  Administered 2021-04-06 – 2021-04-12 (×12): 1 via TOPICAL
  Filled 2021-04-06: qty 113

## 2021-04-06 MED ORDER — ACETAMINOPHEN 160 MG/5ML PO SOLN: 650.0000 mg | ORAL | Status: DC | PRN

## 2021-04-06 MED ORDER — ENOXAPARIN SODIUM 40 MG/0.4ML ~~LOC~~ SOLN: 40.0000 mg | SUBCUTANEOUS | Status: DC

## 2021-04-06 MED ORDER — SENNOSIDES-DOCUSATE SODIUM 8.6-50 MG PO TABS: 1.0000 | ORAL_TABLET | Freq: Every evening | ORAL | Status: DC | PRN

## 2021-04-06 MED ORDER — GLUCERNA SHAKE PO LIQD
237.0000 mL | Freq: Two times a day (BID) | ORAL | Status: DC
  Administered 2021-04-06 – 2021-04-11 (×11): 237 mL via ORAL

## 2021-04-06 MED ORDER — ACETAMINOPHEN 650 MG RE SUPP: 650.0000 mg | RECTAL | Status: DC | PRN

## 2021-04-06 MED ORDER — ENOXAPARIN SODIUM 40 MG/0.4ML ~~LOC~~ SOLN
40.0000 mg | SUBCUTANEOUS | Status: DC
  Administered 2021-04-07 – 2021-04-12 (×6): 40 mg via SUBCUTANEOUS
  Filled 2021-04-06 (×6): qty 0.4

## 2021-04-06 MED ORDER — ATORVASTATIN CALCIUM 40 MG PO TABS
40.0000 mg | ORAL_TABLET | Freq: Every day | ORAL | Status: DC
  Administered 2021-04-07 – 2021-04-12 (×6): 40 mg via ORAL
  Filled 2021-04-06 (×6): qty 1

## 2021-04-06 MED ORDER — EXERCISE FOR HEART AND HEALTH BOOK
Freq: Once | Status: AC
  Filled 2021-04-06: qty 1

## 2021-04-06 MED ORDER — CARVEDILOL 3.125 MG PO TABS
3.1250 mg | ORAL_TABLET | Freq: Two times a day (BID) | ORAL | Status: DC
  Administered 2021-04-06 – 2021-04-12 (×12): 3.125 mg via ORAL
  Filled 2021-04-06 (×12): qty 1

## 2021-04-06 MED ORDER — LOSARTAN POTASSIUM 50 MG PO TABS
50.0000 mg | ORAL_TABLET | Freq: Every day | ORAL | Status: DC
  Administered 2021-04-06 – 2021-04-12 (×7): 50 mg via ORAL
  Filled 2021-04-06 (×7): qty 1

## 2021-04-06 MED ORDER — ACETAMINOPHEN 325 MG PO TABS: 650.0000 mg | ORAL_TABLET | ORAL | Status: DC | PRN

## 2021-04-06 MED ORDER — ASPIRIN EC 81 MG PO TBEC
81.0000 mg | DELAYED_RELEASE_TABLET | Freq: Every day | ORAL | Status: DC
  Administered 2021-04-07 – 2021-04-12 (×6): 81 mg via ORAL
  Filled 2021-04-06 (×6): qty 1

## 2021-04-06 MED ORDER — BLOOD PRESSURE CONTROL BOOK
Freq: Once | Status: AC
  Filled 2021-04-06: qty 1

## 2021-04-06 MED ORDER — LIVING WELL WITH DIABETES BOOK
Freq: Once | Status: AC
  Filled 2021-04-06: qty 1

## 2021-04-06 NOTE — Telephone Encounter (Signed)
Patient's wife called back after getting a message from [place].

## 2021-04-06 NOTE — Progress Notes (Signed)
Cristina Gong, RN  Rehab Admission Coordinator  Physical Medicine and Rehabilitation  PMR Pre-admission     Signed  Date of Service:  04/04/2021  6:38 PM      Related encounter: ED to Hosp-Admission (Discharged) from 04/03/2021 in [facility]       Signed          Show:Clear all [x] Manual[x] Template[x] Copied  Added by: [x] Cristina Gong, RN   [] Hover for details  PMR Admission Coordinator Pre-Admission Assessment   Patient: [name] [name] is an 80 y.o., male MRN: [id number] DOB: [birth year] Height:   Weight: 103.4 kg                                                                                                                                                  Insurance Information HMO:     PPO:      PCP:      IPA:      80/20:      OTHER:  PRIMARY: [employer]     Policy#: [id number]      Subscriber: pt CM Name: Maudie Mercury      Phone#: [phone] ext [phone]     Fax#: [phone] Pre-Cert#: [id number] approved for 7 days f/u with Edwena Felty ext [phone] same fax      Employer:  Benefits:  Phone #[phone]   Name:  Eff. Date: 01/01/2020     Deduct: none      Out of Pocket Max: $4000      Life Max: none  CIR: $160 co pay per day days 1 until 10      SNF: no copay days 1 until 20; $50 co pay per day days 21 until 100 Outpatient: $20 per visit     Co-Pay: visits per medical neccesity Home Health: 100%      Co-Pay: visits per medical neccesity DME: 80%     Co-Pay: 20% Providers: in network  SECONDARY: none      Policy#:       Phone#:    Development worker, community:       Phone#:    The Engineer, petroleum" for patients in Inpatient Rehabilitation Facilities with attached "Privacy Act [place] Records" was provided and verbally reviewed with: Patient and Family   Emergency Contact Information         Contact Information     Name Relation Home Work Mobile    Vanwyk,[name] Spouse [phone]   [phone]    Terrence Dupont  Daughter     [phone]       Current Medical History  Patient Admitting Diagnosis: CVA   History of Present Illness:  80 y.o. right-handed male with history of TIA, hypertension, hyperlipidemia, CAD with CABG 2005 maintained on aspirin, right CEA and questionable medical compliance.  Per chart review lives with spouse independent with assistive device.  1 level home 3 steps to entry.  Presented 04/03/2021 with acute onset left side weakness.  Cranial CT scan negative for acute changes.  CT angiogram of head and neck showed no emergent large vessel occlusion.  Approximately 50% stenosis of the left cavernous internal carotid artery and approximately 70 to 80% stenosis of the proximal internal carotid artery just distal to the carotid bifurcation.  MRI of the brain showed small acute right thalamic infarction.  Echocardiogram with ejection fraction of 60 to 65% no wall motion abnormalities.  Admission chemistries unremarkable except glucose 190, creatinine 1.25, alcohol negative, urine drug screen negative.  Currently maintained on aspirin and Plavix for CVA prophylaxis x3 weeks then Plavix alone.  Subcutaneous Lovenox for DVT prophylaxis.  Tolerating a regular consistency diet.     Complete NIHSS TOTAL: 0 Glasgow Coma Scale Score: 15   Past Medical History      Past Medical History:  Diagnosis Date  . Basal cell carcinoma 11/09/2019    nod & infil-behind right ear-cx3 &exc  . Basal cell carcinoma 03/21/2020    Residual BCC with peripheral margin involved - ST recommends MOHs  . Carotid artery occlusion    . Coronary artery disease      s/p CABG 2005; sees Dr Johnsie Cancel yearly  . Gynecomastia    . Hypercholesterolemia    . Hypertension    . Neurodermatitis    . Overweight(278.02)    . Personal history of colonic polyps 10/08/2006    tubular adenomas  . Renal insufficiency    . Transient ischemic attack 2010    "lasted ~ 5 seconds"  . Vitamin D deficiency        Family History  family  history includes Arthritis in his brother; Diabetes in his brother, mother, and sister; Fibromyalgia in his sister; Healthy in his daughter; Heart attack (age of onset: 65) in his mother; Heart disease in his brother, brother, and mother; Kidney disease in his mother; Lung cancer in his paternal uncle; Lung cancer (age of onset: 86) in his father.   Prior Rehab/Hospitalizations:  Has the patient had prior rehab or hospitalizations prior to admission? Yes   Has the patient had major surgery during 100 days prior to admission? No   Current Medications    Current Facility-Administered Medications:  .  acetaminophen (TYLENOL) tablet 650 mg, 650 mg, Oral, Q4H PRN **OR** acetaminophen (TYLENOL) 160 MG/5ML solution 650 mg, 650 mg, Per Tube, Q4H PRN **OR** acetaminophen (TYLENOL) suppository 650 mg, 650 mg, Rectal, Q4H PRN, Karmen Bongo, MD .  aspirin EC tablet 81 mg, 81 mg, Oral, Daily, Karmen Bongo, MD, 81 mg at 04/06/21 0843 .  atorvastatin (LIPITOR) tablet 40 mg, 40 mg, Oral, Daily, Karmen Bongo, MD, 40 mg at 04/06/21 0843 .  carvedilol (COREG) tablet 3.125 mg, 3.125 mg, Oral, BID WC, Samtani, Jai-Gurmukh, MD, 3.125 mg at 04/06/21 0843 .  clopidogrel (PLAVIX) tablet 75 mg, 75 mg, Oral, Daily, Karmen Bongo, MD, 75 mg at 04/06/21 0843 .  enoxaparin (LOVENOX) injection 40 mg, 40 mg, Subcutaneous, Q24H, Karmen Bongo, MD, 40 mg at 04/06/21 0843 .  feeding supplement (GLUCERNA SHAKE) (GLUCERNA SHAKE) liquid 237 mL, 237 mL, Oral, BID BM, Samtani, Jai-Gurmukh, MD, 237 mL at 04/05/21 1615 .  senna-docusate (Senokot-S) tablet 1 tablet, 1 tablet, Oral, QHS PRN, Karmen Bongo, MD   Patients Current Diet:     Diet Order  Diet - low sodium heart healthy              Diet Heart Room service appropriate? Yes; Fluid consistency: Thin  Diet effective ____                      Precautions / Restrictions Precautions Precautions: Fall Restrictions Weight Bearing  Restrictions: No    Has the patient had 2 or more falls or a fall with injury in the past year?No   Prior Activity Level Community (5-7x/wk): I without AD, driving, declin ein funciton a couple of weeks   Prior Functional Level Prior Function Level of Independence: Independent with assistive device(s) Comments: Still driving. USed cane occasionally   Self Care: Did the patient need help bathing, dressing, using the toilet or eating?  Independent   Indoor Mobility: Did the patient need assistance with walking from room to room (with or without device)? Independent   Stairs: Did the patient need assistance with internal or external stairs (with or without device)? Independent   Functional Cognition: Did the patient need help planning regular tasks such as shopping or remembering to take medications? Independent   Home Assistive Devices / Equipment Home Assistive Devices/Equipment: None Home Equipment: Cane - single point,Walker - 2 wheels,Shower seat   Prior Device Use: Indicate devices/aids used by the patient prior to current illness, exacerbation or injury? None of the above   Current Functional Level Cognition   Overall Cognitive Status: Within Functional Limits for tasks assessed Orientation Level: Oriented X4 Safety/Judgement: Decreased awareness of safety General Comments: overall WFL for simple mobiltiy tasks    Extremity Assessment (includes Sensation/Coordination)   Upper Extremity Assessment: LUE deficits/detail LUE Deficits / Details: mild weakness and decreased coordination, reports numbness in ring finger today LUE Sensation: decreased light touch LUE Coordination: decreased fine motor,decreased gross motor  Lower Extremity Assessment: Defer to PT evaluation LLE Deficits / Details: Grossly 4/5 with MMT, however, with functional mobility, noted buckling and decreased hip flexion when taking a step. Reported LLE felt heavy during gait, but upon return to supine,  reports symptoms resolved.     ADLs   Overall ADL's : Needs assistance/impaired Grooming: Minimal assistance,Standing Upper Body Bathing: Set up,Sitting Lower Body Bathing: Min guard,Sitting/lateral leans Lower Body Bathing Details (indicate cue type and reason): simulated via LB dressing from EOB Upper Body Dressing : Set up,Sitting Lower Body Dressing: Min guard,Sitting/lateral leans Lower Body Dressing Details (indicate cue type and reason): to adjust socks from EOB Toilet Transfer: Minimal assistance,Ambulation,RW Toilet Transfer Details (indicate cue type and reason): simulated via functional mobiltiy with RW and MIN A +1 for balance, cues needed for RW mgmt as pt wanting to pick up RW during ambulation Functional mobility during ADLs: Minimal assistance,Rolling walker,Cueing for safety General ADL Comments: pt with improvements in LUE strength and coordination, pt continues to present with balance impairments     Mobility   Overal bed mobility: Needs Assistance Bed Mobility: Supine to Sit,Sit to Supine Supine to sit: Supervision,HOB elevated Sit to supine: Min guard,HOB elevated General bed mobility comments: supervision - min guard assist for bed mobility, assist needed mostly for line mgmt and safety     Transfers   Overall transfer level: Needs assistance Equipment used: Rolling walker (2 wheeled) Transfers: Sit to/from Stand Sit to Stand: Min guard General transfer comment: min guard assist to rise from EOB, good carryover of hand placement from previous PT session     Ambulation / Gait / Stairs /  Wheelchair Mobility   Ambulation/Gait Ambulation/Gait assistance: Herbalist (Feet): 75 Feet Assistive device: Rolling walker (2 wheeled) Gait Pattern/deviations: Step-through pattern,Decreased stride length,Shuffle,Trunk flexed General Gait Details: min assist to steady and guide RW, especially initially, with cues for upright posture, increased foot clearance,  larger steps, and placement within middle of RW as pt standing towards R of RW. Gait velocity: decr     Posture / Balance Dynamic Sitting Balance Sitting balance - Comments: sitting EOB with no UE support with supervision Balance Overall balance assessment: Needs assistance Sitting-balance support: Feet supported Sitting balance-Leahy Scale: Fair Sitting balance - Comments: sitting EOB with no UE support with supervision Standing balance support: During functional activity,Bilateral upper extremity supported Standing balance-Leahy Scale: Poor Standing balance comment: reliant on BUE support for mobility Standardized Balance Assessment Standardized Balance Assessment : Berg Balance Test Berg Balance Test Sit to Stand: Able to stand using hands after several tries Standing Unsupported: Able to stand 2 minutes with supervision Sitting with Back Unsupported but Feet Supported on Floor or Stool: Able to sit safely and securely 2 minutes Stand to Sit: Controls descent by using hands Transfers: Able to transfer with verbal cueing and /or supervision Standing Unsupported with Eyes Closed: Able to stand 3 seconds Standing Ubsupported with Feet Together: Needs help to attain position but able to stand for 30 seconds with feet together From Standing, Reach Forward with Outstretched Arm: Can reach forward >5 cm safely (2") From Standing Position, Pick up Object from Floor: Able to pick up shoe, needs supervision From Standing Position, Turn to Look Behind Over each Shoulder: Turn sideways only but maintains balance Turn 360 Degrees: Needs assistance while turning Standing Unsupported, Alternately Place Feet on Step/Stool: Needs assistance to keep from falling or unable to try Standing Unsupported, One Foot in Front: Able to take small step independently and hold 30 seconds Standing on One Leg: Tries to lift leg/unable to hold 3 seconds but remains standing independently Total Score: 27      Special needs/care consideration Hgb A1c 6.7        Previous Home Environment  Living Arrangements: Spouse/significant other  Lives With: Spouse Available Help at Discharge: Family,Available 24 hours/day Type of Home: House Home Layout: One level Home Access: Stairs to enter Entrance Stairs-Rails: None Entrance Stairs-Number of Steps: 3 Bathroom Shower/Tub: Chiropodist: Handicapped height Bathroom Accessibility: Yes How Accessible: Accessible via walker Home Care Services: No   Discharge Living Setting Plans for Discharge Living Setting: Patient's home,Lives with (comment) (wife) Type of Home at Discharge: House Discharge Home Layout: One level Discharge Home Access: Stairs to enter Entrance Stairs-Rails: None Entrance Stairs-Number of Steps: 3 Discharge Bathroom Shower/Tub: Tub/shower unit Discharge Bathroom Toilet: Handicapped height Discharge Bathroom Accessibility: Yes How Accessible: Accessible via walker Does the patient have any problems obtaining your medications?: No   Social/Family/Support Systems Contact Information: wife, [name] Anticipated Caregiver: wife Anticipated Ambulance person Information: see above Ability/Limitations of Caregiver: no limitations Caregiver Availability: 24/7 Discharge Plan Discussed with Primary Caregiver: Yes Is Caregiver In Agreement with Plan?: Yes Does Caregiver/Family have Issues with Lodging/Transportation while Pt is in Rehab?: No   Goals Patient/Family Goal for Rehab: Mod I ot superviison with PT, OT, and SLP Expected length of stay: ELOS 10 to 14 days Pt/Family Agrees to Admission and willing to participate: Yes Program Orientation Provided & Reviewed with Pt/Caregiver Including Roles  & Responsibilities: Yes   Decrease burden of Care through IP rehab admission: n/a   Possible need  for SNF placement upon discharge:not anticipated   Patient Condition: This patient's condition remains as documented  in the consult dated 03/04/2021, in which the Rehabilitation Physician determined and documented that the patient's condition is appropriate for intensive rehabilitative care in an inpatient rehabilitation facility. Will admit to inpatient rehab today.   Preadmission Screen Completed By:  Cleatrice Burke, RN, 04/06/2021 10:26 AM ______________________________________________________________________   Discussed status with Dr. Dagoberto Ligas on 04/06/2021 at  1029 and received approval for admission today.   Admission Coordinator:  Cleatrice Burke, time 0258 Date 04/06/2021             Cosigned by: Courtney Heys, MD at 04/06/2021 11:03 AM    Revision History                             Note Details  Author Cristina Gong, RN File Time 04/06/2021 10:29 AM  Author Type Rehab Admission Coordinator Status Signed  Last Editor Cristina Gong, RN Service Physical Medicine and [place] # [id number] Admit Date 04/06/2021

## 2021-04-06 NOTE — Progress Notes (Signed)
Inpatient Rehabilitation Medication Review by a Pharmacist  A complete drug regimen review was completed for this patient to identify any potential clinically significant medication issues.  Clinically significant medication issues were identified:  Yes  Type of Medication Issue Identified Description of Issue Urgent (address now) Non-Urgent (address on AM team rounds) Plan Plan Accepted by Provider? (Yes / No / Pending AM Rounds)  Drug Interaction(s) (clinically significant)       Duplicate Therapy       Allergy       No Medication Administration End Date       Incorrect Dose       Additional Drug Therapy Needed       Other  The following medications were listed on pt's discharge med list from [facility] (these are home meds that pt did not receive at [facility] and are not ordered on admission to CIR): Vitamin B12 Vitamin D NTG Non urgent Pending review by CIR team on AM rounds Pending AM rounds    Name of provider notified for urgent issues identified:  N/A  For non-urgent medication issues to be resolved on team rounds tomorrow morning a [facility] Secure Chat Handoff was sent to:  Marlowe Shores, PA  Time spent performing this drug regimen review (minutes):  15  Gillermina Hu, PharmD, BCPS, [facility] Clinical Pharmacist 04/06/2021 1:58 PM

## 2021-04-06 NOTE — Care Management Important Message (Signed)
Important Message  Patient Details  Name: [name] [name] MRN: [id number] Date of Birth: [birth year]   Medicare Important Message Given:  Yes     Orbie Pyo 04/06/2021, 2:41 PM

## 2021-04-06 NOTE — Telephone Encounter (Signed)
Spoke with pt wife

## 2021-04-06 NOTE — TOC Transition Note (Signed)
Transition of Care [facility]) - CM/SW Discharge Note   Patient Details  Name: [name] [name] MRN: [id number] Date of Birth: [birth year]  Transition of Care [facility]) CM/SW Contact:  Pollie Friar, RN Phone Number: 04/06/2021, 10:35 AM   Clinical Narrative:    Patient is discharging to CIR today. CM signing off.   Final next level of care: IP Rehab Facility Barriers to Discharge: No Barriers Identified   Patient Goals and CMS Choice        Discharge Placement                       Discharge Plan and Services                                     Social Determinants of Health (SDOH) Interventions     Readmission Risk Interventions No flowsheet data found.

## 2021-04-06 NOTE — Progress Notes (Signed)
Inpatient Rehabilitation  Patient information reviewed and entered into eRehab system by Loni Abdon M. Jeliyah Middlebrooks, M.A., CCC/SLP, PPS Coordinator.  Information including medical coding, functional ability and quality indicators will be reviewed and updated through discharge.    

## 2021-04-06 NOTE — Plan of Care (Signed)
  Problem: Education: Goal: Knowledge of General Education information will improve Description: Including pain rating scale, medication(s)/side effects and non-pharmacologic comfort measures Outcome: Progressing   Problem: Health Behavior/Discharge Planning: Goal: Ability to manage health-related needs will improve Outcome: Progressing   Problem: Clinical Measurements: Goal: Ability to maintain clinical measurements within normal limits will improve Outcome: Progressing Goal: Will remain free from infection Outcome: Progressing Goal: Diagnostic test results will improve Outcome: Progressing Goal: Respiratory complications will improve Outcome: Progressing Goal: Cardiovascular complication will be avoided Outcome: Progressing   Problem: Activity: Goal: Risk for activity intolerance will decrease Outcome: Progressing   Problem: Nutrition: Goal: Adequate nutrition will be maintained Outcome: Progressing   Problem: Coping: Goal: Level of anxiety will decrease Outcome: Progressing   Problem: Elimination: Goal: Will not experience complications related to bowel motility Outcome: Progressing Goal: Will not experience complications related to urinary retention Outcome: Progressing   Problem: Pain Managment: Goal: General experience of comfort will improve Outcome: Progressing   Problem: Safety: Goal: Ability to remain free from injury will improve Outcome: Progressing   Problem: Skin Integrity: Goal: Risk for impaired skin integrity will decrease Outcome: Progressing   Problem: Education: Goal: Knowledge of disease or condition will improve Outcome: Progressing Goal: Knowledge of secondary prevention will improve Outcome: Progressing Goal: Knowledge of patient specific risk factors addressed and post discharge goals established will improve Outcome: Progressing   Problem: Self-Care: Goal: Ability to communicate needs accurately will improve Outcome: Progressing

## 2021-04-06 NOTE — H&P (Signed)
Physical Medicine and Rehabilitation Admission H&P   CC: CVA  HPI: [name] [name] is a 80 year old right-handed male with history of TIA, hypertension, hyperlipidemia, CAD with CABG 2005 maintained on aspirin, right CEA and questionable medical compliance.  Per chart review patient lives with spouse.  Independent prior to admission using an assistive device.  1 level home 3 steps to entry.  Presented 04/03/2021 with acute onset of left-sided weakness.  Cranial CT scan negative for acute changes.  CT angiogram of head and neck showed no emergent large vessel occlusion.  Approximately 50% stenosis of the left cavernous internal carotid artery and approximately 70 to 80% stenosis of the proximal internal carotid artery just distal to the carotid bifurcation.  MRI of the brain showed small acute right thalamic infarction.  Echocardiogram with ejection fraction of 60 to 65% no wall motion abnormalities.  Admission chemistries unremarkable except glucose 190 creatinine 1.25 alcohol negative urine drug screen negative.  Currently maintained on aspirin Plavix for CVA prophylaxis x3 weeks then Plavix alone.  Subcutaneous Lovenox for DVT prophylaxis.  Tolerating a regular diet.  Therapy evaluations completed due to patient's decreased functional ability left side weakness recommendations of physical medicine rehab consult patient was admitted for a comprehensive rehab program.   Pt reports finally had a BM- actually has had 2 since Tuesday when I last saw him.  Voiding well, but has been using condom catheter-  Asked ot keep it until AM, and then will go to urinal.  Also asking to remove IV.  No pain.  Review of Systems  Constitutional: Negative for chills and fever.  HENT: Negative for hearing loss.   Eyes: Negative for blurred vision and double vision.  Respiratory: Negative for cough and shortness of breath.   Cardiovascular: Negative for chest pain, palpitations and leg swelling.  Gastrointestinal:  Negative for constipation, heartburn, nausea and vomiting.  Genitourinary: Negative for dysuria, flank pain and hematuria.  Musculoskeletal: Positive for myalgias.  Skin: Negative for rash.  Neurological: Positive for dizziness and weakness.  All other systems reviewed and are negative.  Past Medical History:  Diagnosis Date  . Basal cell carcinoma 11/09/2019   nod & infil-behind right ear-cx3 &exc  . Basal cell carcinoma 03/21/2020   Residual BCC with peripheral margin involved - ST recommends MOHs  . Carotid artery occlusion   . Coronary artery disease    s/p CABG 2005; sees Dr Johnsie Cancel yearly  . Gynecomastia   . Hypercholesterolemia   . Hypertension   . Neurodermatitis   . Overweight(278.02)   . Personal history of colonic polyps 10/08/2006   tubular adenomas  . Renal insufficiency   . Transient ischemic attack 2010   "lasted ~ 5 seconds"  . Vitamin D deficiency    Past Surgical History:  Procedure Laterality Date  . CARDIAC CATHETERIZATION  02/2004   "tried to stent; couldn't"  . CAROTID ENDARTERECTOMY Right 08/26/2015  . CORONARY ANGIOPLASTY    . CORONARY ARTERY BYPASS GRAFT  Feb. 2005   4 vessel  . ENDARTERECTOMY Right 08/26/2015   Procedure: Right Carotid ENDARTERECTOMY with Patch Angioplasty ;  Surgeon: Rosetta Posner, MD;  Location: [facility];  Service: Vascular;  Laterality: Right;  . 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 Dessie Coma  . KELOID EXCISION    . PILONIDAL CYST EXCISION  1989   Family History  Problem Relation Age of Onset  . Heart disease Mother        Before age 70  .  Diabetes Mother   . Kidney disease Mother   . Heart attack Mother 76  . Lung cancer Father 46  . Diabetes Brother   . Heart disease Brother   . Heart disease Brother   . Arthritis Brother   . Diabetes Sister   . Fibromyalgia Sister   . Lung cancer Paternal Uncle        questionable as to if it was lung ca  . Healthy Daughter   . Colon cancer Neg Hx   . Stroke Neg Hx     Social History:  reports that he has never smoked. His smokeless tobacco use includes snuff. He reports that he does not drink alcohol and does not use drugs. Allergies:  Allergies  Allergen Reactions  . Niacin Other (See Comments)    REACTION: intol to NIACIN w/ headaches REACTION: intol to NIACIN w/ headaches   Medications Prior to Admission  Medication Sig Dispense Refill  . acetaminophen (TYLENOL) 325 MG tablet Take 2 tablets (650 mg total) by mouth every 4 (four) hours as needed for mild pain (or temp > 37.5 C (99.5 F)).    [name] aspirin EC 81 MG tablet Take 1 tablet (81 mg total) by mouth daily. 21 tablet 0  . atorvastatin (LIPITOR) 40 MG tablet Take 1 tablet (40 mg total) by mouth daily.    . blood glucose meter kit and supplies KIT Dispense based on patient and insurance preference. Use up to four times daily as directed. (FOR ICD-9 250.00, 250.01). 1 each 0  . carvedilol (COREG) 3.125 MG tablet Take 1 tablet (3.125 mg total) by mouth 2 (two) times daily with a meal.    . Cholecalciferol (VITAMIN D) 2000 UNITS CAPS Take 2,000 Units by mouth daily.    . clopidogrel (PLAVIX) 75 MG tablet Take 1 tablet (75 mg total) by mouth daily. 90 tablet 3  . losartan (COZAAR) 25 MG tablet TAKE 2 TABLETS BY MOUTH EVERY DAY (Patient taking differently: Take 50 mg by mouth daily.) 180 tablet 1  . nitroGLYCERIN (NITROSTAT) 0.4 MG SL tablet Place 1 tablet (0.4 mg total) under the tongue every 5 (five) minutes as needed for chest pain. 25 tablet 3  . vitamin B-12 (CYANOCOBALAMIN) 1000 MCG tablet Take 1,000 mcg by mouth daily.      Drug Regimen Review Drug regimen was reviewed and remains appropriate with no significant issues identified  Home: Home Living Family/patient expects to be discharged to:: Private residence Living Arrangements: Spouse/significant other   Functional History:    Functional Status:  Mobility:          ADL:    Cognition: Cognition Orientation Level: Oriented  X4    Physical Exam: Blood pressure (!) 160/68, pulse 63, temperature 97.8 F (36.6 C), temperature source Oral, resp. rate 17, SpO2 91 %. Physical Exam Vitals and nursing note reviewed. Exam conducted with a chaperone present.  Constitutional:      Comments: Pt awake, elderly male sitting up in bed; wife at bedside; nursing in room, mildly HOH, NAD  HENT:     Head: Normocephalic and atraumatic.     Comments: Mild L facial droop- not drooling today Tongue midline    Right Ear: External ear normal.     Left Ear: External ear normal.     Nose: Nose normal. No congestion.     Mouth/Throat:     Mouth: Mucous membranes are moist.     Pharynx: Oropharynx is clear. No oropharyngeal exudate.  Eyes:  General:        Right eye: No discharge.        Left eye: No discharge.     Extraocular Movements: Extraocular movements intact.     Comments: No nystagmus today (had Tuesday)  Cardiovascular:     Rate and Rhythm: Normal rate and regular rhythm.     Heart sounds: Normal heart sounds. No murmur heard. No gallop.   Pulmonary:     Comments: CTA B/L- no W/R/R- good air movement  Abdominal:     Comments: Soft, NT, ND, (+)BS hypoactive  Genitourinary:    Comments: Condom catheter in place- light amber urine in bag Musculoskeletal:     Cervical back: Normal range of motion and neck supple.     Comments: UEs 5/5 B/L LEs- 5/5 B/L in all muscles tested Slightly leaning laterally in bed- can't tell where he is in space  Skin:    General: Skin is warm and dry.     Comments: Buttocks and heels look great except some dry skin on feet and legs  Neurological:     Comments: Patient is alert in no acute distress makes eye contact with examiner.  Left facial droop with drooling.  Oriented to person place and time.  Fair awareness of deficits.  Follows simple commands. Decreased rapid alternating movements/slowed and not intact on L, but much worse on R Also finger to nose impaired B/L- searching  pattern and slow Intact to light touch in all 4 extremities Mild ataxia and apraxia seen  Psychiatric:     Comments: Slightly flat, but appropriate, HOH     Results for orders placed or performed during the hospital encounter of 04/06/21 (from the past 48 hour(s))  CBC     Status: Abnormal   Collection Time: 04/06/21  1:46 PM  Result Value Ref Range   WBC 6.8 4.0 - 10.5 K/uL   RBC 4.53 4.22 - 5.81 MIL/uL   Hemoglobin 15.9 13.0 - 17.0 g/dL   HCT 45.1 39.0 - 52.0 %   MCV 99.6 80.0 - 100.0 fL   MCH 35.1 (H) 26.0 - 34.0 pg   MCHC 35.3 30.0 - 36.0 g/dL   RDW 12.6 11.5 - 15.5 %   Platelets 204 150 - 400 K/uL   nRBC 0.0 0.0 - 0.2 %    Comment: Performed at [facility], [place] [address], [place], [place] [address]  Creatinine, serum     Status: None   Collection Time: 04/06/21  1:46 PM  Result Value Ref Range   Creatinine, Ser 1.04 0.61 - 1.24 mg/dL   GFR, Estimated >60 >60 mL/min    Comment: (NOTE) Calculated using the CKD-EPI Creatinine Equation (2021) Performed at [facility] [address], [place], [place] [address]    No results found.     Medical Problem List and Plan: 1.  Left facial droop and impaired balance/and impaired movements due to ataxia/apraxiasecondary to right thalamic infarction likely small vessel disease  -patient may  shower  -ELOS/Goals: 10-14 days- mod I to supervision 2.  Antithrombotics: -DVT/anticoagulation: Lovenox  -antiplatelet therapy: Aspirin 81 mg daily and Plavix 75 mg daily x3 weeks then Plavix alone 3. Pain Management: Tylenol as needed 4. Mood: Provide emotional support  -antipsychotic agents: N/A 5. Neuropsych: This patient is capable of making decisions on his own behalf. 6. Skin/Wound Care: Routine skin checks 7. Fluids/Electrolytes/Nutrition: Routine in and outs with follow-up chemistries 8.  Hypertension.  Coreg 3.125 mg twice daily Cozaar  50 mg daily..  Monitor with increased mobility 9.  Hyperlipidemia.   Lipitor 10.  History of CAD with CABG.  Continue aspirin and Plavix.  No chest pain or shortness of breath 11.  Type 2 diabetes mellitus.  Hemoglobin A1c 6.0.  Diabetic diet 12.  Questionable medical compliance.  Provide counseling 13. Dry skin- will order eucerin BID and d/c IV since not using.    Lavon Paganini Anguilli- PA-C 04/06/2021   I have personally performed a face to face diagnostic evaluation of this patient and formulated the key components of the plan.  Additionally, I have personally reviewed laboratory data, imaging studies, as well as relevant notes and concur with the physician assistant's documentation above.        Courtney Heys, MD 04/06/2021

## 2021-04-06 NOTE — Progress Notes (Signed)
Inpatient Rehabilitation Admissions Coordinator  I have insurance approval and CIR bed to admit patient to today. I contacted Dr. Karleen Hampshire, acute team and TOC. I will make the arrangements to admit today.  Danne Baxter, RN, MSN Rehab Admissions Coordinator [phone] 04/06/2021 10:20 AM

## 2021-04-06 NOTE — Progress Notes (Signed)
Courtney Heys, MD  Physician  Physical Medicine and Rehabilitation  Consult Note     Signed  Date of Service:  04/04/2021 12:11 PM      Related encounter: ED to Hosp-Admission (Discharged) from 04/03/2021 in [facility]       Signed      Expand All Collapse All     Show:Clear all [x]Manual[x]Template[]Copied  Added by: [x]Angiulli, Lavon Paganini, PA-C[x]Lovorn, Jinny Blossom, MD   []Hover for details           Physical Medicine and Rehabilitation Consult Reason for Consult: Left side weakness Referring Physician: Dr. Shauna Hugh     HPI: [name] [name] is a 80 y.o. right-handed male with history of TIA, hypertension, hyperlipidemia, CAD with CABG 2005 maintained on aspirin, right CEA and questionable medical compliance.  Per chart review lives with spouse independent with assistive device.  1 level home 3 steps to entry.  Presented 04/03/2021 with acute onset left side weakness.  Cranial CT scan negative for acute changes.  CT angiogram of head and neck showed no emergent large vessel occlusion.  Approximately 50% stenosis of the left cavernous internal carotid artery and approximately 70 to 80% stenosis of the proximal internal carotid artery just distal to the carotid bifurcation.  MRI of the brain showed small acute right thalamic infarction.  Echocardiogram with ejection fraction of 60 to 65% no wall motion abnormalities.  Admission chemistries unremarkable except glucose 190, creatinine 1.25, alcohol negative, urine drug screen negative.  Currently maintained on aspirin and Plavix for CVA prophylaxis x3 weeks then Plavix alone.  Subcutaneous Lovenox for DVT prophylaxis.  Tolerating a regular consistency diet.  Therapy evaluations completed due to patient's decreased functional mobility recommendations of physical medicine rehab consult.   Pt denies pain- LBM Sunday- hasn't eaten a lot- and it's pretty normal for him.  Has a little numbness of L lip but not L hand anymore-  also still feels a trace weakness of leg.  Per PT note, pt has Berg scale 27/54- and loss of balance/lateral lean.  Per wife, has had wide/staggering shortened gait for 1+ months- no AD.     Review of Systems  Constitutional: Negative for chills and fever.  HENT: Negative for hearing loss.   Eyes: Negative for blurred vision.  Respiratory: Negative for cough and shortness of breath.   Cardiovascular: Negative for chest pain, palpitations and leg swelling.  Gastrointestinal: Positive for constipation. Negative for heartburn, nausea and vomiting.  Genitourinary: Negative for dysuria, flank pain and hematuria.  Musculoskeletal: Positive for myalgias.  Skin: Negative for rash.  Neurological: Positive for dizziness and weakness.  All other systems reviewed and are negative.       Past Medical History:  Diagnosis Date  . Basal cell carcinoma 11/09/2019    nod & infil-behind right ear-cx3 &exc  . Basal cell carcinoma 03/21/2020    Residual BCC with peripheral margin involved - ST recommends MOHs  . Carotid artery occlusion    . Coronary artery disease      s/p CABG 2005; sees Dr Johnsie Cancel yearly  . Gynecomastia    . Hypercholesterolemia    . Hypertension    . Neurodermatitis    . Overweight(278.02)    . Personal history of colonic polyps 10/08/2006    tubular adenomas  . Renal insufficiency    . Transient ischemic attack 2010    "lasted ~ 5 seconds"  . Vitamin D deficiency  Past Surgical History:  Procedure Laterality Date  . CARDIAC CATHETERIZATION   02/2004    "tried to stent; couldn't"  . CAROTID ENDARTERECTOMY Right 08/26/2015  . CORONARY ANGIOPLASTY      . CORONARY ARTERY BYPASS GRAFT   Feb. 2005    4 vessel  . ENDARTERECTOMY Right 08/26/2015    Procedure: Right Carotid ENDARTERECTOMY with Patch Angioplasty ;  Surgeon: Rosetta Posner, MD;  Location: [facility];  Service: Vascular;  Laterality: Right;  . KELOID EXCISION   04/2008    on chest scar; Dr. Dessie Coma  . KELOID  EXCISION      . PILONIDAL CYST EXCISION   1989         Family History  Problem Relation Age of Onset  . Heart disease Mother          Before age 49  . Diabetes Mother    . Kidney disease Mother    . Heart attack Mother 59  . Lung cancer Father 64  . Diabetes Brother    . Heart disease Brother    . Heart disease Brother    . Arthritis Brother    . Diabetes Sister    . Fibromyalgia Sister    . Lung cancer Paternal Uncle          questionable as to if it was lung ca  . Healthy Daughter    . Colon cancer Neg Hx    . Stroke Neg Hx      Social History:  reports that he has never smoked. His smokeless tobacco use includes snuff. He reports that he does not drink alcohol and does not use drugs. Allergies:       Allergies  Allergen Reactions  . Niacin Other (See Comments)      REACTION: intol to NIACIN w/ headaches REACTION: intol to NIACIN w/ headaches          Medications Prior to Admission  Medication Sig Dispense Refill  . aspirin EC 81 MG tablet Take 1 tablet (81 mg total) by mouth daily.      [name] atorvastatin (LIPITOR) 20 MG tablet TAKE 1/2 TABLET BY MOUTH EVERY DAY (Patient taking differently: Take 10 mg by mouth daily.) 45 tablet 3  . blood glucose meter kit and supplies KIT Dispense based on patient and insurance preference. Use up to four times daily as directed. (FOR ICD-9 250.00, 250.01). 1 each 0  . Cholecalciferol (VITAMIN D) 2000 UNITS CAPS Take 2,000 Units by mouth daily.      . hydrochlorothiazide (MICROZIDE) 12.5 MG capsule Take 1 capsule (12.5 mg total) by mouth daily. Please make yearly appt with Dr. Johnsie Cancel for April 2022 for future refills. Thank you 1st attempt (Patient taking differently: Take 12.5 mg by mouth daily.) 90 capsule 0  . losartan (COZAAR) 25 MG tablet TAKE 2 TABLETS BY MOUTH EVERY DAY (Patient taking differently: Take 50 mg by mouth daily.) 180 tablet 1  . nitroGLYCERIN (NITROSTAT) 0.4 MG SL tablet Place 1 tablet (0.4 mg total) under the tongue every  5 (five) minutes as needed for chest pain. 25 tablet 3  . vitamin B-12 (CYANOCOBALAMIN) 1000 MCG tablet Take 1,000 mcg by mouth daily.          Home: Home Living Family/patient expects to be discharged to:: Private residence Living Arrangements: Spouse/significant other Available Help at Discharge: Family Type of Home: House Home Access: Stairs to enter Technical brewer of Steps: 3 Entrance Stairs-Rails: None Home Layout: One level Bathroom Shower/Tub: Tub/shower  unit Bathroom Toilet: Handicapped height Home Equipment: [place] - single point,Walker - 2 wheels,Shower seat  Functional History: Prior Function Level of Independence: Independent with assistive device(s) Comments: Still driving. USed cane occasionally Functional Status:  Mobility: Bed Mobility Overal bed mobility: Needs Assistance Bed Mobility: Sit to Supine Sit to supine: Min guard General bed mobility comments: for safety, increased time required Transfers Overall transfer level: Needs assistance Equipment used: Rolling walker (2 wheeled) Transfers: Sit to/from Stand Sit to Stand: Min assist General transfer comment: min assist to power up fully and steady, cueing for hand placement and safety; L lean without UE support Ambulation/Gait Ambulation/Gait assistance: Min assist,Mod assist Gait Distance (Feet): 50 Feet Assistive device: None Gait Pattern/deviations: Step-through pattern,Decreased weight shift to left,Staggering left General Gait Details: Pt with multiple LOB during gait. Decreased step height in LLE and noted foot drag in LLE. Required cues for increased step height. Noted buckling at knees as well. Initially requiring min A, but then required mod A for remainder of gait. Neuro MD present to evaluate. Gait velocity: Decreased   ADL: ADL Overall ADL's : Needs assistance/impaired Grooming: Minimal assistance,Standing Upper Body Bathing: Set up,Sitting Lower Body Bathing: Minimal assistance,Sit  to/from stand Upper Body Dressing : Set up,Sitting Lower Body Dressing: Moderate assistance,Sit to/from stand Lower Body Dressing Details (indicate cue type and reason): min assist to fully manage socks, min assist in standing Toilet Transfer: Minimal assistance,Ambulation,RW Toilet Transfer Details (indicate cue type and reason): simulated to recliner Functional mobility during ADLs: Minimal assistance,Moderate assistance,Rolling walker,Cueing for safety General ADL Comments: pt limited by L weakness and decreased coordination, impaired balance, and safety awareness   Cognition: Cognition Overall Cognitive Status: Impaired/Different from baseline Orientation Level: Oriented X4 Cognition Arousal/Alertness: Awake/alert Behavior During Therapy: WFL for tasks assessed/performed Overall Cognitive Status: Impaired/Different from baseline Area of Impairment: Safety/judgement,Awareness,Problem solving Safety/Judgement: Decreased awareness of safety,Decreased awareness of deficits Awareness: Emergent Problem Solving: Slow processing,Requires verbal cues,Difficulty sequencing General Comments: pt with decreased awareness to deficits and safety, cueing for problem solving   Blood pressure (!) 144/68, pulse 63, temperature 98 F (36.7 C), temperature source Oral, resp. rate 18, SpO2 95 %. Physical Exam Vitals and nursing note reviewed. Exam conducted with a chaperone present.  Constitutional:      Comments: Pt is sitting up in bed- wife at bedside- was transferred with NT assistance to bed from chair at beginning of interview-- required moderate assistance to get to be bed using RW, NAD  HENT:     Head: Normocephalic.     Comments: Very trace L lower lip numbness Mild L facial droop- but noted was drooling from L side of mouth x2 Tongue midline    Right Ear: External ear normal.     Left Ear: External ear normal.     Nose: Nose normal. No congestion.     Mouth/Throat:     Mouth: Mucous  membranes are moist.     Pharynx: Oropharynx is clear. No oropharyngeal exudate.  Eyes:     General:        Right eye: No discharge.        Left eye: No discharge.     Extraocular Movements: Extraocular movements intact.     Comments: 2 beats nystagmus seen horizontally- EOMI B/L  Cardiovascular:     Rate and Rhythm: Normal rate and regular rhythm.     Heart sounds: Normal heart sounds. No murmur heard. No gallop.   Pulmonary:     Comments: CTA B/L- no W/R/R- good  air movement Abdominal:     Comments: Soft, NT, ND, (+)BS  - hypoactive  Genitourinary:    Comments: Condom cath draining light amber urine Musculoskeletal:     Cervical back: Normal range of motion and neck supple.     Comments: UEs 5/5- biceps, triceps, WE, grip and finger abd B/L LEs- 5/5 in HF, KE, DF and PF B/L  Skin:    General: Skin is warm and dry.     Comments: IV R AC fossa- looks OK- getting 50cc/hour NS IVFs Buttocks look clear- no wounds  Neurological:     Comments: Patient is alert in no acute distress.  Makes eye contact with examiner follows commands.  Oriented to person place and time.  Fair awareness of deficits. Ox3- Drooling on L side of mouth Intact to light touch in all 4 extremities- except face as above Finger to nose impaired B/L- very searching pattern Rapid alternating movements slowed, but intact B/L Heel to shin impaired B/L       Lab Results Last 24 Hours       Results for orders placed or performed during the hospital encounter of 04/03/21 (from the past 24 hour(s))  Comprehensive metabolic panel     Status: Abnormal    Collection Time: 04/04/21  8:35 AM  Result Value Ref Range    Sodium 138 135 - 145 mmol/L    Potassium 3.7 3.5 - 5.1 mmol/L    Chloride 104 98 - 111 mmol/L    CO2 28 22 - 32 mmol/L    Glucose, Bld 119 (H) 70 - 99 mg/dL    BUN 10 8 - 23 mg/dL    Creatinine, Ser 1.10 0.61 - 1.24 mg/dL    Calcium 8.2 (L) 8.9 - 10.3 mg/dL    Total Protein 5.8 (L) 6.5 - 8.1 g/dL     Albumin 3.1 (L) 3.5 - 5.0 g/dL    AST 22 15 - 41 U/L    ALT 22 0 - 44 U/L    Alkaline Phosphatase 64 38 - 126 U/L    Total Bilirubin 1.4 (H) 0.3 - 1.2 mg/dL    GFR, Estimated >60 >60 mL/min    Anion gap 6 5 - 15  CBC with Differential/Platelet     Status: Abnormal    Collection Time: 04/04/21  8:35 AM  Result Value Ref Range    WBC 6.7 4.0 - 10.5 K/uL    RBC 4.41 4.22 - 5.81 MIL/uL    Hemoglobin 15.8 13.0 - 17.0 g/dL    HCT 45.0 39.0 - 52.0 %    MCV 102.0 (H) 80.0 - 100.0 fL    MCH 35.8 (H) 26.0 - 34.0 pg    MCHC 35.1 30.0 - 36.0 g/dL    RDW 12.9 11.5 - 15.5 %    Platelets 216 150 - 400 K/uL    nRBC 0.0 0.0 - 0.2 %    Neutrophils Relative % 69 %    Neutro Abs 4.6 1.7 - 7.7 K/uL    Lymphocytes Relative 18 %    Lymphs Abs 1.2 0.7 - 4.0 K/uL    Monocytes Relative 10 %    Monocytes Absolute 0.7 0.1 - 1.0 K/uL    Eosinophils Relative 2 %    Eosinophils Absolute 0.2 0.0 - 0.5 K/uL    Basophils Relative 0 %    Basophils Absolute 0.0 0.0 - 0.1 K/uL    Immature Granulocytes 1 %    Abs Immature Granulocytes 0.06 0.00 - 0.07  K/uL       Imaging Results (Last 48 hours)  CT ANGIO HEAD NECK W WO CM (CODE STROKE)   Result Date: 04/03/2021 CLINICAL DATA:  Stroke follow-up. EXAM: CT ANGIOGRAPHY HEAD AND NECK TECHNIQUE: Multidetector CT imaging of the head and neck was performed using the standard protocol during bolus administration of intravenous contrast. Multiplanar CT image reconstructions and MIPs were obtained to evaluate the vascular anatomy. Carotid stenosis measurements (when applicable) are obtained utilizing NASCET criteria, using the distal internal carotid diameter as the denominator. CONTRAST:  34m OMNIPAQUE IOHEXOL 350 MG/ML SOLN COMPARISON:  Same day head CT.  MRA June 04, 2009. FINDINGS: CTA NECK FINDINGS Aortic arch: Great vessel origins are patent. Atherosclerosis of the aorta and branch vessels. Right carotid system: Atherosclerosis of the common carotid artery and at the  carotid bifurcation without greater than 50% stenosis. Left carotid system: Mixed calcific and noncalcific atherosclerosis at the carotid bifurcation and involving the proximal ICA with approximately 70-80% stenosis of the proximal ICA. Vertebral arteries: Left dominant. Approximately 50% stenosis of the right vertebral artery origin secondary to atherosclerosis. Mild narrowing of the left vertebral artery origin secondary to predominately calcific atherosclerosis. Remainder of the vertebral arteries are patent bilaterally. z Skeleton: Mild multilevel degenerative disc disease. Other neck: No mass or suspicious adenopathy. Upper chest: Visualized lung apices are clear. Partially imaged median sternotomy. Review of the MIP images confirms the above findings CTA HEAD FINDINGS Anterior circulation: Bilateral calcific atherosclerosis of the cavernous and paraclinoid ICAs with approximately 60% stenosis of the left cavernous ICA. No evidence of greater than 50% stenosis on the right. Bilateral MCA and ACAs are patent without evidence of proximal hemodynamically significant stenosis. 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 Trifurcation of the ACA. No aneurysm identified. Posterior circulation: Bilateral intradural vertebral arteries and basilar artery are patent with mild narrowing of the right vertebral artery at its dural margin. Left fetal type PCA. Bilateral PCAs are patent without evidence of proximal hemodynamically significant stenosis. No aneurysm identified Venous sinuses: As permitted by contrast timing, patent. Anatomic variants: As detailed above Review of the MIP images confirms the above findings IMPRESSION: CTA Head: 1. No emergent large vessel occlusion. 2. 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 CTA Neck: 1. Approximately 70-80% stenosis of the proximal internal carotid artery just distal to the carotid bifurcation. 2.  Approximately 50% stenosis of the right vertebral artery origin. Electronically Signed   By: FMargaretha SheffieldMD   On: 04/03/2021 08:25    CT HEAD WO CONTRAST   Result Date: 04/03/2021 CLINICAL DATA:  80year old male with numbness tingling, paresthesia. EXAM: CT HEAD WITHOUT CONTRAST TECHNIQUE: Contiguous axial images were obtained from the base of the skull through the vertex without intravenous contrast. COMPARISON:  Brain MRI 06/04/2009.  Head CT 06/03/2009. FINDINGS: Brain: No midline shift, mass effect, or evidence of intracranial mass lesion. No ventriculomegaly. No acute intracranial hemorrhage identified. Patchy and confluent hypodensity in the bilateral cerebral white matter, deep gray matter nuclei. This is mildly progressed since 2010. No cortically based acute infarct identified. No cortical encephalomalacia identified. Vascular: Calcified atherosclerosis at the skull base. No suspicious intracranial vascular hyperdensity. Skull: Stable.  No acute osseous abnormality identified. Sinuses/Orbits: Visualized paranasal sinuses and mastoids are stable and well pneumatized. Other: Tiny right forehead scalp lipoma (benign series 4, image 37). Postoperative changes to both globes. No acute orbit or scalp soft tissue finding. IMPRESSION: 1. No acute  intracranial abnormality identified. 2. Advanced chronic small vessel disease. Mild progression since 2010. Electronically Signed   By: Genevie Ann M.D.   On: 04/03/2021 06:02    MR BRAIN WO CONTRAST   Result Date: 04/03/2021 CLINICAL DATA:  TIA. Left facial numbness and left leg weakness, now resolved. EXAM: MRI HEAD WITHOUT CONTRAST TECHNIQUE: Multiplanar, multiecho pulse sequences of the brain and surrounding structures were obtained without intravenous contrast. COMPARISON:  Head CT and CTA 04/03/2021.  Head MRI 06/04/2009. FINDINGS: The study is mildly motion degraded. Brain: There is an 8 mm focus of mild trace diffusion weighted signal hyperintensity  laterally in the right thalamus with the suggestion of subtly reduced ADC (most conspicuous on the coronal ADC map) likely reflecting an acute infarct with this clinical history. Patchy to confluent T2 hyperintensities in the cerebral white matter bilaterally have mildly progressed from the prior MRI and are nonspecific but compatible with moderately severe chronic small vessel ischemic disease. Chronic lacunar infarcts are again noted in the deep cerebral white matter bilaterally, right basal ganglia, left thalamus, and pons. There is mild generalized cerebral atrophy. No intracranial hemorrhage, mass, midline shift, or extra-axial fluid collection is identified. Vascular: Major intracranial vascular flow voids are preserved. Skull and upper cervical spine: Unremarkable bone marrow signal. Sinuses/Orbits: Unremarkable orbits. Trace right mastoid effusion. Clear paranasal sinuses. Other: Small right frontal scalp lipoma. IMPRESSION: 1. Suspected small acute right thalamic infarct. 2. 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 Electronically Signed   By: Logan Bores M.D.   On: 04/03/2021 11:02    ECHOCARDIOGRAM COMPLETE   Result Date: 04/03/2021    ECHOCARDIOGRAM REPORT   Patient Name:   [name] [name] Date of Exam: 04/03/2021 Medical Rec #:  [id number]        Height:       72.0 in Accession #:    [id number]       Weight:       231.0 lb Date of Birth:  [birth year]       BSA:          2.265 m Patient Age:    39 years         BP:           159/76 mmHg Patient Gender: M                HR:           71 bpm. Exam Location:  Inpatient Procedure: 2D Echo, Cardiac Doppler and Color Doppler Indications:    TIA  History:        Patient has no prior history of Echocardiogram examinations.                 CAD, TIA; Risk Factors:Hypertension and Dyslipidemia.  Sonographer:    Brock Referring Phys: [id number] [place]  1. Left ventricular ejection  fraction, by estimation, is 60 to 65%. The left ventricle has normal function. The left ventricle has no regional wall motion abnormalities. Left ventricular diastolic parameters are indeterminate.  2. Right ventricular systolic function is normal. The right ventricular size is normal.  3. The mitral valve is normal in structure. No evidence of mitral valve regurgitation. No evidence of mitral stenosis.  4. The aortic valve is tricuspid. There is mild calcification of the aortic valve. Aortic valve regurgitation is not visualized. Mild to moderate aortic valve sclerosis/calcification is present, without any evidence of aortic stenosis.  5. The inferior  vena cava is normal in size with greater than 50% respiratory variability, suggesting right atrial pressure of 3 mmHg. FINDINGS  Left Ventricle: Left ventricular ejection fraction, by estimation, is 60 to 65%. The left ventricle has normal function. The left ventricle has no regional wall motion abnormalities. The left ventricular internal cavity size was normal in size. There is  no left ventricular hypertrophy. Left ventricular diastolic parameters are indeterminate. Right Ventricle: The right ventricular size is normal. No increase in right ventricular wall thickness. Right ventricular systolic function is normal. Left Atrium: Left atrial size was normal in size. Right Atrium: Right atrial size was normal in size. Pericardium: There is no evidence of pericardial effusion. Mitral Valve: The mitral valve is normal in structure. No evidence of mitral valve regurgitation. No evidence of mitral valve stenosis. Tricuspid Valve: The tricuspid valve is normal in structure. Tricuspid valve regurgitation is trivial. No evidence of tricuspid stenosis. Aortic Valve: The aortic valve is tricuspid. There is mild calcification of the aortic valve. Aortic valve regurgitation is not visualized. Mild to moderate aortic valve sclerosis/calcification is present, without any evidence  of aortic stenosis. Aortic valve mean gradient measures 3.5 mmHg. Aortic valve peak gradient measures 6.1 mmHg. Aortic valve area, by VTI measures 2.12 cm. Pulmonic Valve: The pulmonic valve was normal in structure. Pulmonic valve regurgitation is mild. No evidence of pulmonic stenosis. Aorta: The aortic root is normal in size and structure. Venous: The inferior vena cava is normal in size with greater than 50% respiratory variability, suggesting right atrial pressure of 3 mmHg. IAS/Shunts: The interatrial septum was not well visualized.  LEFT VENTRICLE PLAX 2D LVIDd:         4.60 cm LVIDs:         3.40 cm LV PW:         1.30 cm LV IVS:        1.40 cm LVOT diam:     1.80 cm LV SV:         53 LV SV Index:   23 LVOT Area:     2.54 cm  LV Volumes (MOD) LV vol d, MOD A2C: 35.9 ml LV vol d, MOD A4C: 70.2 ml LV vol s, MOD A4C: 24.8 ml LV SV MOD A4C:     70.2 ml RIGHT VENTRICLE TAPSE (M-mode): 1.2 cm LEFT ATRIUM             Index       RIGHT ATRIUM           Index LA Vol (A2C):   59.6 ml 26.31 ml/m RA Area:     13.10 cm LA Vol (A4C):   52.6 ml 23.22 ml/m RA Volume:   28.50 ml  12.58 ml/m LA Biplane Vol: 57.4 ml 25.34 ml/m  AORTIC VALVE                   PULMONIC VALVE AV Area (Vmax):    1.90 cm    PV Vmax:       0.97 m/s AV Area (Vmean):   1.98 cm    PV Vmean:      71.800 cm/s AV Area (VTI):     2.12 cm    PV VTI:        0.216 m AV Vmax:           123.50 cm/s PV Peak grad:  3.8 mmHg AV Vmean:          88.350 cm/s PV Mean grad:  2.0 mmHg  AV VTI:            0.249 m AV Peak Grad:      6.1 mmHg AV Mean Grad:      3.5 mmHg LVOT Vmax:         92.30 cm/s LVOT Vmean:        68.700 cm/s LVOT VTI:          0.207 m LVOT/AV VTI ratio: 0.83  AORTA Ao Root diam: 3.40 cm Ao Asc diam:  2.90 cm MITRAL VALVE MV Area (PHT): 3.85 cm    SHUNTS MV Decel Time: 197 msec    Systemic VTI:  0.21 m MV E velocity: 70.40 cm/s  Systemic Diam: 1.80 cm MV A velocity: 77.10 cm/s MV E/A ratio:  0.91 [name] MD Electronically signed by [name] MD Signature Date/Time: 04/03/2021/3:27:08 PM    Final          Assessment/Plan: Diagnosis: 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 Does the need for close, 24 hr/day medical supervision in concert with the patient's rehab needs make it unreasonable for this patient to be served in a less intensive setting? Yes 2. Co-Morbidities requiring supervision/potential complications: TIAs, CABG 2005, CAD, HTN, HLD- stopped plavix 3 months ago with agreement by Cardiology; L handed, of note 3. Due to bladder management, bowel management, safety, skin/wound care, disease management, medication administration, pain management and patient education, does the patient require 24 hr/day rehab nursing? Yes 4. Does the patient require coordinated care of a physician, rehab nurse, therapy disciplines of PT, ot and maybe SLP to address physical and functional deficits in the context of the above medical diagnosis(es)? Yes Addressing deficits in the following areas: balance, endurance, locomotion, strength, transferring, bowel/bladder control, bathing, dressing, feeding, grooming, toileting and swallowing 5. Can the patient actively participate in an intensive therapy program of at least 3 hrs of therapy per day at least 5 days per week? Yes 6. The potential for patient to make measurable gains while on inpatient rehab is excellent 7. Anticipated functional outcomes upon discharge from inpatient rehab are modified independent and supervision  with PT, modified independent and supervision with OT, modified independent with SLP. 8. Estimated rehab length of stay to reach the above functional goals is: 10-14 days 9. Anticipated discharge destination: Home 10. Overall Rehab/Functional Prognosis: excellent   RECOMMENDATIONS: This patient's condition is appropriate for continued rehabilitative care in the following setting: CIR Patient has agreed to participate in recommended  program. Potentially Note that insurance prior authorization may be required for reimbursement for recommended care.   Comment:  1. Pt is a very good candidate for CIR- will submit for Admissions coordinators.  2. Suggest SLP when comes to CIR, due to the drooling on L side- just to make sure- esp because wife notes occ pt coughs with thin liquids.  3. Based on prolonged d/w wife- sounds like he was having different TIA Sx's for last 1+ months-  4. Thank you for this consult.      Lavon Paganini Angiulli, PA-C 04/04/2021    I have personally performed a face to face diagnostic evaluation of this patient and formulated the key components of the plan.  Additionally, I have personally reviewed laboratory data, imaging studies, as well as relevant notes and concur with the physician assistant's documentation above.                Revision History  Routing History              Note Details  Jan Fireman, MD File Time 04/04/2021  3:56 PM  Author Type Physician Status Signed  Last Editor Courtney Heys, MD Service [facility] [place] # [id number] Admit Date 04/06/2021

## 2021-04-06 NOTE — Discharge Summary (Signed)
Physician Discharge Summary  [name] [name] IZT:[id number] DOB: May 20, 1941 DOA: 04/03/2021  PCP: Pleas Koch, NP  Admit date: 04/03/2021 Discharge date: 04/06/2021  Admitted From: Home.  Disposition:  CIR   Recommendations for Outpatient Follow-up:  1. Follow up with PCP in 1-2 weeks 2. Please obtain BMP/CBC in one week 3. Please follow up with neurology as recommended.  4. Please follow up with Dr Donnetta Hutching for left ICA stenosis.     Discharge Condition: stable.  CODE STATUS: full code.  Diet recommendation: Heart Healthy    Brief/Interim Summary:  80 year old male TIA, CABG 2005 , Right carotid endarterectomy with patch angioplasty 2016. HTN HLD prior basal cell CA BMI >40, Came to emergency room 4/4 AM secondary to left facial numbness left leg weakness which resolved after an hour  Developed recurrent symptoms in the afternoon after admission at around 11:30 AM with slurred speech left upper extremity ataxia and mild balance dysfunction NIH was 2 and felt not to be a candidate for TPA because of minimal symptoms  Echo showed  EF 60%, MRI right small thalamic infarct LDL 57 A1c 6  therapy evaluations recommending CIR.     Discharge Diagnoses:  Principal Problem:   TIA (transient ischemic attack) Active Problems:   Essential hypertension   Type 2 diabetes mellitus ([place])   Carotid artery disease (HCC)  Right sided small thalamic infarct Neurology consutled, recommended DAPT FOR  3 weeks followed by plavix alone,  Continue with statin on discharge.  Recommend outpatient follow up with neurology as recommended.  Pt will need outpatient follow up with vascular surgery for Left ICA stenosis Up to 80% .  Hypertension:  Resume cozaar and coreg added.    Type 2 DM with hyperglycemia:  CBG (last 3)  No results for input(s): GLUCAP in the last 72 hours.  Last A1c is 6  Outpatient follow upw ith PCP.   Discharge Instructions  Discharge Instructions    Ambulatory  referral to Neurology   Complete by: As directed    Follow up with Dr. Tomi Likens in 4-6 weeks. Thanks.   Diet - low sodium heart healthy   Complete by: As directed    Diet - low sodium heart healthy   Complete by: As directed    Increase activity slowly   Complete by: As directed    Increase activity slowly   Complete by: As directed      Allergies as of 04/06/2021      Reactions   Niacin Other (See Comments)   REACTION: intol to NIACIN w/ headaches REACTION: intol to NIACIN w/ headaches      Medication List    STOP taking these medications   hydrochlorothiazide 12.5 MG capsule Commonly known as: MICROZIDE     TAKE these medications   acetaminophen 325 MG tablet Commonly known as: TYLENOL Take 2 tablets (650 mg total) by mouth every 4 (four) hours as needed for mild pain (or temp > 37.5 C (99.5 F)).   aspirin EC 81 MG tablet Take 1 tablet (81 mg total) by mouth daily.   atorvastatin 40 MG tablet Commonly known as: LIPITOR Take 1 tablet (40 mg total) by mouth daily. What changed:   medication strength  how much to take   blood glucose meter kit and supplies Kit Dispense based on patient and insurance preference. Use up to four times daily as directed. (FOR ICD-9 250.00, 250.01).   carvedilol 3.125 MG tablet Commonly known as: COREG Take 1 tablet (3.125 mg  total) by mouth 2 (two) times daily with a meal.   clopidogrel 75 MG tablet Commonly known as: PLAVIX Take 1 tablet (75 mg total) by mouth daily.   losartan 25 MG tablet Commonly known as: COZAAR TAKE 2 TABLETS BY MOUTH EVERY DAY   nitroGLYCERIN 0.4 MG SL tablet Commonly known as: NITROSTAT Place 1 tablet (0.4 mg total) under the tongue every 5 (five) minutes as needed for chest pain.   vitamin B-12 1000 MCG tablet Commonly known as: CYANOCOBALAMIN Take 1,000 mcg by mouth daily.   Vitamin D 50 MCG (2000 UT) Caps Take 2,000 Units by mouth daily.       Follow-up Information    Pieter Partridge, DO.  Schedule an appointment as soon as possible for a visit in 4 week(s).   Specialty: Neurology Contact information: [address] [place] [address] [phone]        Pleas Koch, NP. Schedule an appointment as soon as possible for a visit in 1 week(s).   Specialty: Internal Medicine Contact information: [place] [address] [phone]        Josue Hector, MD .   Specialty: Cardiology Contact information: [phone] [place] [address] [phone]              Allergies  Allergen Reactions  . Niacin Other (See Comments)    REACTION: intol to NIACIN w/ headaches REACTION: intol to NIACIN w/ headaches    Consultations:  Neurology.    Procedures/Studies: CT ANGIO HEAD NECK W WO CM (CODE STROKE)  Result Date: 04/03/2021 CLINICAL DATA:  Stroke follow-up. EXAM: CT ANGIOGRAPHY HEAD AND NECK TECHNIQUE: Multidetector CT imaging of the head and neck was performed using the standard protocol during bolus administration of intravenous contrast. Multiplanar CT image reconstructions and MIPs were obtained to evaluate the vascular anatomy. Carotid stenosis measurements (when applicable) are obtained utilizing NASCET criteria, using the distal internal carotid diameter as the denominator. CONTRAST:  10m OMNIPAQUE IOHEXOL 350 MG/ML SOLN COMPARISON:  Same day head CT.  MRA June 04, 2009. FINDINGS: CTA NECK FINDINGS Aortic arch: Great vessel origins are patent. Atherosclerosis of the aorta and branch vessels. Right carotid system: Atherosclerosis of the common carotid artery and at the carotid bifurcation without greater than 50% stenosis. Left carotid system: Mixed calcific and noncalcific atherosclerosis at the carotid bifurcation and involving the proximal ICA with approximately 70-80% stenosis of the proximal ICA. Vertebral arteries: Left dominant. Approximately 50% stenosis of the right vertebral artery origin  secondary to atherosclerosis. Mild narrowing of the left vertebral artery origin secondary to predominately calcific atherosclerosis. Remainder of the vertebral arteries are patent bilaterally. z Skeleton: Mild multilevel degenerative disc disease. Other neck: No mass or suspicious adenopathy. Upper chest: Visualized lung apices are clear. Partially imaged median sternotomy. Review of the MIP images confirms the above findings CTA HEAD FINDINGS Anterior circulation: Bilateral calcific atherosclerosis of the cavernous and paraclinoid ICAs with approximately 60% stenosis of the left cavernous ICA. No evidence of greater than 50% stenosis on the right. Bilateral MCA and ACAs are patent without evidence of proximal hemodynamically significant stenosis. 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 Trifurcation of the ACA. No aneurysm identified. Posterior circulation: Bilateral intradural vertebral arteries and basilar artery are patent with mild narrowing of the right vertebral artery at its dural margin. Left fetal type PCA. Bilateral PCAs are patent without evidence  of proximal hemodynamically significant stenosis. No aneurysm identified Venous sinuses: As permitted by contrast timing, patent. Anatomic variants: As detailed above Review of the MIP images confirms the above findings IMPRESSION: CTA Head: 1. No emergent large vessel occlusion. 2. 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 CTA Neck: 1. Approximately 70-80% stenosis of the proximal internal carotid artery just distal to the carotid bifurcation. 2. Approximately 50% stenosis of the right vertebral artery origin. Electronically Signed   By: Margaretha Sheffield MD   On: 04/03/2021 08:25   CT HEAD WO CONTRAST  Result Date: 04/03/2021 CLINICAL DATA:  80 year old male with numbness tingling, paresthesia. EXAM: CT HEAD WITHOUT CONTRAST TECHNIQUE: Contiguous axial images were obtained from  the base of the skull through the vertex without intravenous contrast. COMPARISON:  Brain MRI 06/04/2009.  Head CT 06/03/2009. FINDINGS: Brain: No midline shift, mass effect, or evidence of intracranial mass lesion. No ventriculomegaly. No acute intracranial hemorrhage identified. Patchy and confluent hypodensity in the bilateral cerebral white matter, deep gray matter nuclei. This is mildly progressed since 2010. No cortically based acute infarct identified. No cortical encephalomalacia identified. Vascular: Calcified atherosclerosis at the skull base. No suspicious intracranial vascular hyperdensity. Skull: Stable.  No acute osseous abnormality identified. Sinuses/Orbits: Visualized paranasal sinuses and mastoids are stable and well pneumatized. Other: Tiny right forehead scalp lipoma (benign series 4, image 37). Postoperative changes to both globes. No acute orbit or scalp soft tissue finding. IMPRESSION: 1. No acute intracranial abnormality identified. 2. Advanced chronic small vessel disease. Mild progression since 2010. Electronically Signed   By: Genevie Ann M.D.   On: 04/03/2021 06:02   MR BRAIN WO CONTRAST  Result Date: 04/03/2021 CLINICAL DATA:  TIA. Left facial numbness and left leg weakness, now resolved. EXAM: MRI HEAD WITHOUT CONTRAST TECHNIQUE: Multiplanar, multiecho pulse sequences of the brain and surrounding structures were obtained without intravenous contrast. COMPARISON:  Head CT and CTA 04/03/2021.  Head MRI 06/04/2009. FINDINGS: The study is mildly motion degraded. Brain: There is an 8 mm focus of mild trace diffusion weighted signal hyperintensity laterally in the right thalamus with the suggestion of subtly reduced ADC (most conspicuous on the coronal ADC map) likely reflecting an acute infarct with this clinical history. Patchy to confluent T2 hyperintensities in the cerebral white matter bilaterally have mildly progressed from the prior MRI and are nonspecific but compatible with moderately  severe chronic small vessel ischemic disease. Chronic lacunar infarcts are again noted in the deep cerebral white matter bilaterally, right basal ganglia, left thalamus, and pons. There is mild generalized cerebral atrophy. No intracranial hemorrhage, mass, midline shift, or extra-axial fluid collection is identified. Vascular: Major intracranial vascular flow voids are preserved. Skull and upper cervical spine: Unremarkable bone marrow signal. Sinuses/Orbits: Unremarkable orbits. Trace right mastoid effusion. Clear paranasal sinuses. Other: Small right frontal scalp lipoma. IMPRESSION: 1. Suspected small acute right thalamic infarct. 2. 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 Electronically Signed   By: Logan Bores M.D.   On: 04/03/2021 11:02   ECHOCARDIOGRAM COMPLETE  Result Date: 04/03/2021    ECHOCARDIOGRAM REPORT   Patient Name:   [name] [name] Date of Exam: 04/03/2021 Medical Rec #:  [id number]        Height:       72.0 in Accession #:    [id number]       Weight:       231.0 lb Date of Birth:  1941/03/24  BSA:          2.265 m Patient Age:    58 years         BP:           159/76 mmHg Patient Gender: M                HR:           71 bpm. Exam Location:  Inpatient Procedure: 2D Echo, Cardiac Doppler and Color Doppler Indications:    TIA  History:        Patient has no prior history of Echocardiogram examinations.                 CAD, TIA; Risk Factors:Hypertension and Dyslipidemia.  Sonographer:    [place] Referring Phys: [id number] [place]  1. Left ventricular ejection fraction, by estimation, is 60 to 65%. The left ventricle has normal function. The left ventricle has no regional wall motion abnormalities. Left ventricular diastolic parameters are indeterminate.  2. Right ventricular systolic function is normal. The right ventricular size is normal.  3. The mitral valve is normal in structure. No evidence of mitral valve  regurgitation. No evidence of mitral stenosis.  4. The aortic valve is tricuspid. There is mild calcification of the aortic valve. Aortic valve regurgitation is not visualized. Mild to moderate aortic valve sclerosis/calcification is present, without any evidence of aortic stenosis.  5. The inferior vena cava is normal in size with greater than 50% respiratory variability, suggesting right atrial pressure of 3 mmHg. FINDINGS  Left Ventricle: Left ventricular ejection fraction, by estimation, is 60 to 65%. The left ventricle has normal function. The left ventricle has no regional wall motion abnormalities. The left ventricular internal cavity size was normal in size. There is  no left ventricular hypertrophy. Left ventricular diastolic parameters are indeterminate. Right Ventricle: The right ventricular size is normal. No increase in right ventricular wall thickness. Right ventricular systolic function is normal. Left Atrium: Left atrial size was normal in size. Right Atrium: Right atrial size was normal in size. Pericardium: There is no evidence of pericardial effusion. Mitral Valve: The mitral valve is normal in structure. No evidence of mitral valve regurgitation. No evidence of mitral valve stenosis. Tricuspid Valve: The tricuspid valve is normal in structure. Tricuspid valve regurgitation is trivial. No evidence of tricuspid stenosis. Aortic Valve: The aortic valve is tricuspid. There is mild calcification of the aortic valve. Aortic valve regurgitation is not visualized. Mild to moderate aortic valve sclerosis/calcification is present, without any evidence of aortic stenosis. Aortic valve mean gradient measures 3.5 mmHg. Aortic valve peak gradient measures 6.1 mmHg. Aortic valve area, by VTI measures 2.12 cm. Pulmonic Valve: The pulmonic valve was normal in structure. Pulmonic valve regurgitation is mild. No evidence of pulmonic stenosis. Aorta: The aortic root is normal in size and structure. Venous: The  inferior vena cava is normal in size with greater than 50% respiratory variability, suggesting right atrial pressure of 3 mmHg. IAS/Shunts: The interatrial septum was not well visualized.  LEFT VENTRICLE PLAX 2D LVIDd:         4.60 cm LVIDs:         3.40 cm LV PW:         1.30 cm LV IVS:        1.40 cm LVOT diam:     1.80 cm LV SV:         53 LV SV Index:   23 LVOT Area:  2.54 cm  LV Volumes (MOD) LV vol d, MOD A2C: 35.9 ml LV vol d, MOD A4C: 70.2 ml LV vol s, MOD A4C: 24.8 ml LV SV MOD A4C:     70.2 ml RIGHT VENTRICLE TAPSE (M-mode): 1.2 cm LEFT ATRIUM             Index       RIGHT ATRIUM           Index LA Vol (A2C):   59.6 ml 26.31 ml/m RA Area:     13.10 cm LA Vol (A4C):   52.6 ml 23.22 ml/m RA Volume:   28.50 ml  12.58 ml/m LA Biplane Vol: 57.4 ml 25.34 ml/m  AORTIC VALVE                   PULMONIC VALVE AV Area (Vmax):    1.90 cm    PV Vmax:       0.97 m/s AV Area (Vmean):   1.98 cm    PV Vmean:      71.800 cm/s AV Area (VTI):     2.12 cm    PV VTI:        0.216 m AV Vmax:           123.50 cm/s PV Peak grad:  3.8 mmHg AV Vmean:          88.350 cm/s PV Mean grad:  2.0 mmHg AV VTI:            0.249 m AV Peak Grad:      6.1 mmHg AV Mean Grad:      3.5 mmHg LVOT Vmax:         92.30 cm/s LVOT Vmean:        68.700 cm/s LVOT VTI:          0.207 m LVOT/AV VTI ratio: 0.83  AORTA Ao Root diam: 3.40 cm Ao Asc diam:  2.90 cm MITRAL VALVE MV Area (PHT): 3.85 cm    SHUNTS MV Decel Time: 197 msec    Systemic VTI:  0.21 m MV E velocity: 70.40 cm/s  Systemic Diam: 1.80 cm MV A velocity: 77.10 cm/s MV E/A ratio:  0.91 [name] MD Electronically signed by [name] MD Signature Date/Time: 04/03/2021/3:27:08 PM    Final      Subjective: No new complaints.   Discharge Exam: Vitals:   04/06/21 0322 04/06/21 0816  BP: (!) 153/52 (!) 149/90  Pulse: 63 66  Resp: 18 18  Temp: 98 F (36.7 C) 97.8 F (36.6 C)  SpO2: 91% 92%   Vitals:   04/05/21 1928 04/05/21 2326 04/06/21 0322 04/06/21 0816  BP: (!)  145/72 140/62 (!) 153/52 (!) 149/90  Pulse: 63 63 63 66  Resp: _0 Temp: 98.1 F (36.7 C) 98.2 F (36.8 C) 98 F (36.7 C) 97.8 F (36.6 C)  TempSrc: Oral Oral Oral Oral  SpO2: 93% 93% 91% 92%  Weight:        General: Pt is alert, awake, not in acute distress Cardiovascular: RRR, S1/S2 +, no rubs, no gallops Respiratory: CTA bilaterally, no wheezing, no rhonchi Abdominal: Soft, NT, ND, bowel sounds + Extremities: no edema, no cyanosis    The results of significant diagnostics from this hospitalization (including imaging, microbiology, ancillary and laboratory) are listed below for reference.     Microbiology: Recent Results (from the past 240 hour(s))  Resp Panel by RT-PCR (Flu A&B, Covid) Nasopharyngeal Swab     Status:  None   Collection Time: 04/03/21  5:54 AM   Specimen: Nasopharyngeal Swab; Nasopharyngeal(NP) swabs in vial transport medium  Result Value Ref Range Status   SARS Coronavirus 2 by RT PCR NEGATIVE NEGATIVE Final    Comment: (NOTE) SARS-CoV-2 target nucleic acids are NOT DETECTED.  The SARS-CoV-2 RNA is generally detectable in upper respiratory specimens during the acute phase of infection. The lowest concentration of SARS-CoV-2 viral copies this assay can detect is 138 copies/mL. A negative result does not preclude SARS-Cov-2 infection and should not be used as the sole basis for treatment or other patient management decisions. A negative result may occur with  improper specimen collection/handling, submission of specimen other than nasopharyngeal swab, presence of viral mutation(s) within the areas targeted by this assay, and inadequate number of viral copies(<138 copies/mL). A negative result must be combined with clinical observations, patient history, and epidemiological information. The expected result is Negative.  Fact Sheet for Patients:  EntrepreneurPulse.com.au  Fact Sheet for Healthcare Providers:   IncredibleEmployment.be  This test is no t yet approved or cleared by the [place] FDA and  has been authorized for detection and/or diagnosis of SARS-CoV-2 by FDA under an Emergency Use Authorization (EUA). This EUA will remain  in effect (meaning this test can be used) for the duration of the COVID-19 declaration under Section 564(b)(1) of the Act, 21 U.S.C.section 360bbb-3(b)(1), unless the authorization is terminated  or revoked sooner.       Influenza A by PCR NEGATIVE NEGATIVE Final   Influenza B by PCR NEGATIVE NEGATIVE Final    Comment: (NOTE) The Xpert Xpress SARS-CoV-2/FLU/RSV plus assay is intended as an aid in the diagnosis of influenza from Nasopharyngeal swab specimens and should not be used as a sole basis for treatment. Nasal washings and aspirates are unacceptable for Xpert Xpress SARS-CoV-2/FLU/RSV testing.  Fact Sheet for Patients: EntrepreneurPulse.com.au  Fact Sheet for Healthcare Providers: IncredibleEmployment.be  This test is not yet approved or cleared by the [place] FDA and has been authorized for detection and/or diagnosis of SARS-CoV-2 by FDA under an Emergency Use Authorization (EUA). This EUA will remain in effect (meaning this test can be used) for the duration of the COVID-19 declaration under Section 564(b)(1) of the Act, 21 U.S.C. section 360bbb-3(b)(1), unless the authorization is terminated or revoked.  Performed at [facility], [place] [address], [place], [place] [address]      Labs: BNP (last 3 results) No results for input(s): BNP in the last 8760 hours. Basic Metabolic Panel: Recent Labs  Lab 04/03/21 0515 04/04/21 0835 04/05/21 0336  NA 137 138 138  K 3.5 3.7 3.9  CL 98 104 105  CO2 _0 GLUCOSE 190* 119* 119*  BUN _1 CREATININE 1.25* 1.10 1.09  CALCIUM 8.7* 8.2* 8.3*  MG  --   --  2.0   Liver Function Tests: Recent Labs  Lab  04/03/21 0515 04/04/21 0835  AST 26 22  ALT 26 22  ALKPHOS 68 64  BILITOT 0.9 1.4*  PROT 6.2* 5.8*  ALBUMIN 3.3* 3.1*   No results for input(s): LIPASE, AMYLASE in the last 168 hours. No results for input(s): AMMONIA in the last 168 hours. CBC: Recent Labs  Lab 04/03/21 0515 04/04/21 0835 04/05/21 0336  WBC 6.2 6.7 6.7  NEUTROABS 4.1 4.6 4.3  HGB 16.0 15.8 14.9  HCT 46.6 45.0 42.4  MCV 101.3* 102.0* 101.7*  PLT 251 216 193   Cardiac Enzymes: No results  for input(s): CKTOTAL, CKMB, CKMBINDEX, TROPONINI in the last 168 hours. BNP: Invalid input(s): POCBNP CBG: No results for input(s): GLUCAP in the last 168 hours. D-Dimer No results for input(s): DDIMER in the last 72 hours. Hgb A1c Recent Labs    04/03/21 1111  HGBA1C 6.7*   Lipid Profile Recent Labs    04/03/21 1111  CHOL 143  HDL 31*  LDLCALC 86  TRIG 130  CHOLHDL 4.6   Thyroid function studies Recent Labs    04/03/21 1111  TSH 2.483   Anemia work up No results for input(s): VITAMINB12, FOLATE, FERRITIN, TIBC, IRON, RETICCTPCT in the last 72 hours. Urinalysis    Component Value Date/Time   COLORURINE STRAW (A) 04/03/2021 0515   APPEARANCEUR CLEAR 04/03/2021 0515   LABSPEC 1.004 (L) 04/03/2021 0515   PHURINE 6.0 04/03/2021 0515   GLUCOSEU NEGATIVE 04/03/2021 0515   GLUCOSEU 100 mg/dL (A) 01/31/2009 1003   HGBUR NEGATIVE 04/03/2021 0515   BILIRUBINUR NEGATIVE 04/03/2021 0515   KETONESUR NEGATIVE 04/03/2021 0515   PROTEINUR NEGATIVE 04/03/2021 0515   UROBILINOGEN 0.2 08/18/2015 0905   NITRITE NEGATIVE 04/03/2021 0515   LEUKOCYTESUR NEGATIVE 04/03/2021 0515   Sepsis Labs Invalid input(s): PROCALCITONIN,  WBC,  LACTICIDVEN Microbiology Recent Results (from the past 240 hour(s))  Resp Panel by RT-PCR (Flu A&B, Covid) Nasopharyngeal Swab     Status: None   Collection Time: 04/03/21  5:54 AM   Specimen: Nasopharyngeal Swab; Nasopharyngeal(NP) swabs in vial transport medium  Result Value Ref  Range Status   SARS Coronavirus 2 by RT PCR NEGATIVE NEGATIVE Final    Comment: (NOTE) SARS-CoV-2 target nucleic acids are NOT DETECTED.  The SARS-CoV-2 RNA is generally detectable in upper respiratory specimens during the acute phase of infection. The lowest concentration of SARS-CoV-2 viral copies this assay can detect is 138 copies/mL. A negative result does not preclude SARS-Cov-2 infection and should not be used as the sole basis for treatment or other patient management decisions. A negative result may occur with  improper specimen collection/handling, submission of specimen other than nasopharyngeal swab, presence of viral mutation(s) within the areas targeted by this assay, and inadequate number of viral copies(<138 copies/mL). A negative result must be combined with clinical observations, patient history, and epidemiological information. The expected result is Negative.  Fact Sheet for Patients:  EntrepreneurPulse.com.au  Fact Sheet for Healthcare Providers:  IncredibleEmployment.be  This test is no t yet approved or cleared by the [place] FDA and  has been authorized for detection and/or diagnosis of SARS-CoV-2 by FDA under an Emergency Use Authorization (EUA). This EUA will remain  in effect (meaning this test can be used) for the duration of the COVID-19 declaration under Section 564(b)(1) of the Act, 21 U.S.C.section 360bbb-3(b)(1), unless the authorization is terminated  or revoked sooner.       Influenza A by PCR NEGATIVE NEGATIVE Final   Influenza B by PCR NEGATIVE NEGATIVE Final    Comment: (NOTE) The Xpert Xpress SARS-CoV-2/FLU/RSV plus assay is intended as an aid in the diagnosis of influenza from Nasopharyngeal swab specimens and should not be used as a sole basis for treatment. Nasal washings and aspirates are unacceptable for Xpert Xpress SARS-CoV-2/FLU/RSV testing.  Fact Sheet for  Patients: EntrepreneurPulse.com.au  Fact Sheet for Healthcare Providers: IncredibleEmployment.be  This test is not yet approved or cleared by the [place] FDA and has been authorized for detection and/or diagnosis of SARS-CoV-2 by FDA under an Emergency Use Authorization (EUA). This EUA will remain in effect (  meaning this test can be used) for the duration of the COVID-19 declaration under Section 564(b)(1) of the Act, 21 U.S.C. section 360bbb-3(b)(1), unless the authorization is terminated or revoked.  Performed at [facility], [place] [address], [place], [place] [address]      Time coordinating discharge: 32 minutes.   SIGNED:   Hosie Poisson, MD  [facility] 04/06/2021, 10:34 AM

## 2021-04-06 NOTE — Progress Notes (Signed)
Patient arrived on unit. Rehab schedule, medications and plan of care reviewed, patient states an understanding. Patient is Ax4 and has no complications noted at this time. Patient reports pain 0 out of 10. Patient educated on use of call light. Katalia Choma G Kitt [place]  

## 2021-04-07 DIAGNOSIS — I639 Cerebral infarction, unspecified: Secondary | ICD-10-CM | POA: Diagnosis not present

## 2021-04-07 LAB — CBC WITH DIFFERENTIAL/PLATELET
Abs Immature Granulocytes: 0.04 10*3/uL (ref 0.00–0.07)
Basophils Absolute: 0.1 10*3/uL (ref 0.0–0.1)
Basophils Relative: 1 %
Eosinophils Absolute: 0.2 10*3/uL (ref 0.0–0.5)
Eosinophils Relative: 3 %
HCT: 44.7 % (ref 39.0–52.0)
Hemoglobin: 15.9 g/dL (ref 13.0–17.0)
Immature Granulocytes: 1 %
Lymphocytes Relative: 18 %
Lymphs Abs: 1.3 10*3/uL (ref 0.7–4.0)
MCH: 35.7 pg — ABNORMAL HIGH (ref 26.0–34.0)
MCHC: 35.6 g/dL (ref 30.0–36.0)
MCV: 100.2 fL — ABNORMAL HIGH (ref 80.0–100.0)
Monocytes Absolute: 0.9 10*3/uL (ref 0.1–1.0)
Monocytes Relative: 12 %
Neutro Abs: 4.6 10*3/uL (ref 1.7–7.7)
Neutrophils Relative %: 65 %
Platelets: 197 10*3/uL (ref 150–400)
RBC: 4.46 MIL/uL (ref 4.22–5.81)
RDW: 12.7 % (ref 11.5–15.5)
WBC: 7.1 10*3/uL (ref 4.0–10.5)
nRBC: 0 % (ref 0.0–0.2)

## 2021-04-07 LAB — COMPREHENSIVE METABOLIC PANEL
ALT: 22 U/L (ref 0–44)
AST: 23 U/L (ref 15–41)
Albumin: 3 g/dL — ABNORMAL LOW (ref 3.5–5.0)
Alkaline Phosphatase: 67 U/L (ref 38–126)
Anion gap: 4 — ABNORMAL LOW (ref 5–15)
BUN: 13 mg/dL (ref 8–23)
CO2: 29 mmol/L (ref 22–32)
Calcium: 8.5 mg/dL — ABNORMAL LOW (ref 8.9–10.3)
Chloride: 105 mmol/L (ref 98–111)
Creatinine, Ser: 1.09 mg/dL (ref 0.61–1.24)
GFR, Estimated: >60 mL/min (ref >60)
Glucose, Bld: 118 mg/dL — ABNORMAL HIGH (ref 70–99)
Potassium: 4.2 mmol/L (ref 3.5–5.1)
Sodium: 138 mmol/L (ref 135–145)
Total Bilirubin: 1.1 mg/dL (ref 0.3–1.2)
Total Protein: 5.8 g/dL — ABNORMAL LOW (ref 6.5–8.1)

## 2021-04-07 NOTE — Progress Notes (Signed)
Inpatient Rehabilitation Care Coordinator Assessment and Plan Patient Details  Name: [name] [name] MRN: [id number] Date of Birth: 11/07/1941  Today's Date: 04/07/2021  Hospital Problems: Principal Problem:   Right thalamic infarction [facility]) Active Problems:   Essential hypertension   Type 2 diabetes mellitus ([place])   Unsteady gait  Past Medical History:  Past Medical History:  Diagnosis Date  . Basal cell carcinoma 11/09/2019   nod & infil-behind right ear-cx3 &exc  . Basal cell carcinoma 03/21/2020   Residual BCC with peripheral margin involved - ST recommends MOHs  . Carotid artery occlusion   . Coronary artery disease    s/p CABG 2005; sees Dr Johnsie Cancel yearly  . Gynecomastia   . Hypercholesterolemia   . Hypertension   . Neurodermatitis   . Overweight(278.02)   . Personal history of colonic polyps 10/08/2006   tubular adenomas  . Renal insufficiency   . Transient ischemic attack 2010   "lasted ~ 5 seconds"  . Vitamin D deficiency    Past Surgical History:  Past Surgical History:  Procedure Laterality Date  . CARDIAC CATHETERIZATION  02/2004   "tried to stent; couldn't"  . CAROTID ENDARTERECTOMY Right 08/26/2015  . CORONARY ANGIOPLASTY    . CORONARY ARTERY BYPASS GRAFT  Feb. 2005   4 vessel  . ENDARTERECTOMY Right 08/26/2015   Procedure: Right Carotid ENDARTERECTOMY with Patch Angioplasty ;  Surgeon: Rosetta Posner, MD;  Location: [place];  Service: Vascular;  Laterality: Right;  . 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 Dessie Coma  . KELOID EXCISION    . PILONIDAL CYST EXCISION  1989   Social History:  reports that he has never smoked. His smokeless tobacco use includes snuff. He reports that he does not drink alcohol and does not use drugs.  Family / Support Systems Marital Status: Married How Long?: 67 years (4/8) Patient Roles: Partner,Parent Spouse/Significant Other: [name] (wife): [phone] Children: 1 adult dtr- Sharyn Lull (lives in [place]) Other  Supports: NOne reported Anticipated Caregiver: Wife Ability/Limitations of Caregiver: None reported Caregiver Availability: 24/7 Family Dynamics: Pt lives with his wife. He and his wife were watching their grandchildren and great grandchild 2-3xs per week prior to his admission.  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 Education: high school Read: Yes Write: Yes Employment Status: Retired Date Retired/Disabled/Unemployed: [employer] ([place]) Age Retired: 58 Public relations account executive Issues: Denies Guardian/Conservator: N/A   Abuse/Neglect Abuse/Neglect Assessment Can Be Completed: Yes Physical Abuse: Denies Verbal Abuse: Denies Sexual Abuse: Denies Exploitation of patient/patient's resources: Denies Self-Neglect: Denies  Emotional Status Pt's affect, behavior and adjustment status: Pt in good spirits at time of visit Recent Psychosocial Issues: Denies Psychiatric History: Denies Substance Abuse History: Denies. Pt admits that he has been dipping tobacco for 30+years up until date of admission.  Patient / Family Perceptions, Expectations & Goals Pt/Family understanding of illness & functional limitations: Pt and wife have general understanding of care needs. Premorbid pt/family roles/activities: Independent Anticipated changes in roles/activities/participation: Assistance with ADLs/IADLs Pt/family expectations/goals: Pt goal is to "get out of here, go home; work on balance and walking."  [employer]: None Premorbid Home Care/DME Agencies: None Transportation available at discharge: Wife Resource referrals recommended: Neuropsychology  Discharge Planning Living Arrangements: Spouse/significant other Support Systems: Spouse/significant other Type of Residence: Private  residence Insurance underwriter Resources: Multimedia programmer (specify) ([place]) Museum/gallery curator  Resources: Radio broadcast assistant Screen Referred: No Living Expenses: Medical laboratory scientific officer Management: Spouse,Patient Does the patient have any problems obtaining your medications?: No Home Management: Both managed home care needs Patient/Family Preliminary Plans: TBD Care Coordinator Barriers to Discharge: Decreased caregiver support,Lack of/limited family support Care Coordinator Anticipated Follow Up Needs: HH/OP  Clinical Impression SW covering for primary SW, Erlene Quan.  SW met with pt and pt wife [name] in room to introduce self, explain role, and discuss discharge process. Pt not a veteran. NO HCPOA. DME: access to RW and shower chair.   Rana Snare 04/07/2021, 3:54 PM

## 2021-04-07 NOTE — Progress Notes (Signed)
Patient ID: [name] [name], male   DOB: [birth year], 80 y.o.   MRN: [id number]     Cardiology Office Note   Date:  04/18/2021   ID:  [name] [name], DOB Nov 17, 1941, MRN [id number]  PCP:  Pleas Koch, NP  Cardiologist:  Dr. Johnsie Cancel    No chief complaint on file.     History of Present Illness: [name] [name] is a 80 y.o. male  known coronary disease with previous coronary bypass surgery in 2005. he has normal LV function.  Last myovue 09/09/18 no ischemia EF 64% low risk August 2016 Had right CEA with Dr Early  Duplex 10/2019 plaque no stenosis   On statin with LDL at goal  No angina palpitations dyspnea or syncope  More broad based gait seen by Tomi Likens neuro Doubts Parkinson's getting B12 shots   Admitted 04/03/21 with stroke in thalamic region with left sided weakness left facial numbness and left leg weakness Echo normal EF 60% MRI small thalamic stroke LDL 57 A1c6 RX with DAT 3 weeks then plavix alone sent to inpatient rehab BP Rx with cozaar and coreg Somewhat confusing CTA report ? 70-80% proximal ICA but no laterality given Last duplex 10/17/20 had patent right CEA and 40-59% Left ICA stenosis Infarct felt to be from small vessel disease with associated lacunar infarcts but recommended f/u with [facility] for left ICA   He had a myriad of questions mostly about his meds, stroke and rehab. I told him I wanted him to f/u  With Dr Early to decipher the CTA report. His statin was increased due to LDL 86. BP meds are Adequate Discussed referral to EP for possible loop recorder to r/o PAF given his age group   Past Medical History:  Diagnosis Date  . Basal cell carcinoma 11/09/2019   nod & infil-behind right ear-cx3 &exc  . Basal cell carcinoma 03/21/2020   Residual BCC with peripheral margin involved - ST recommends MOHs  . Carotid artery occlusion   . Coronary artery disease    s/p CABG 2005; sees Dr Johnsie Cancel yearly  . Gynecomastia   . Hypercholesterolemia   . Hypertension   .  Neurodermatitis   . Overweight(278.02)   . Personal history of colonic polyps 10/08/2006   tubular adenomas  . Renal insufficiency   . Transient ischemic attack 2010   "lasted ~ 5 seconds"  . Vitamin D deficiency     Past Surgical History:  Procedure Laterality Date  . CARDIAC CATHETERIZATION  02/2004   "tried to stent; couldn't"  . CAROTID ENDARTERECTOMY Right 08/26/2015  . CORONARY ANGIOPLASTY    . CORONARY ARTERY BYPASS GRAFT  Feb. 2005   4 vessel  . ENDARTERECTOMY Right 08/26/2015   Procedure: Right Carotid ENDARTERECTOMY with Patch Angioplasty ;  Surgeon: Rosetta Posner, MD;  Location: [place];  Service: Vascular;  Laterality: Right;  . 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 Dessie Coma  . KELOID EXCISION    . PILONIDAL CYST EXCISION  1989     Current Outpatient Medications  Medication Sig Dispense Refill  . acetaminophen (TYLENOL) 325 MG tablet Take 2 tablets (650 mg total) by mouth every 4 (four) hours as needed for mild pain (or temp > 37.5 C (99.5 F)).    [name] aspirin EC 81 MG tablet Take 1 tablet (81 mg total) by mouth daily. 21 tablet 0  . atorvastatin (LIPITOR) 40 MG tablet Take 1 tablet (40 mg total) by mouth daily. 30 tablet 0  .  carvedilol (COREG) 3.125 MG tablet Take 1 tablet (3.125 mg total) by mouth 2 (two) times daily with a meal. 60 tablet 0  . Cholecalciferol (VITAMIN D) 50 MCG (2000 UT) CAPS Take 1 capsule (2,000 Units total) by mouth daily. 30 capsule 0  . clopidogrel (PLAVIX) 75 MG tablet Take 1 tablet (75 mg total) by mouth daily. 90 tablet 3  . losartan (COZAAR) 25 MG tablet Take 2 tablets (50 mg total) by mouth daily. 180 tablet 1  . nitroGLYCERIN (NITROSTAT) 0.4 MG SL tablet Place 1 tablet (0.4 mg total) under the tongue every 5 (five) minutes as needed for chest pain. 25 tablet 3  . vitamin B-12 (CYANOCOBALAMIN) 1000 MCG tablet Take 1,000 mcg by mouth daily.     No current facility-administered medications for this visit.    Allergies:   Niacin     Social History:  The patient  reports that he has never smoked. His smokeless tobacco use includes snuff. He reports that he does not drink alcohol and does not use drugs.   Family History:  The patient's family history includes Arthritis in his brother; Diabetes in his brother, mother, and sister; Fibromyalgia in his sister; Healthy in his daughter; Heart attack (age of onset: 37) in his mother; Heart disease in his brother, brother, and mother; Kidney disease in his mother; Lung cancer in his paternal uncle; Lung cancer (age of onset: 29) in his father.    ROS:  General:no colds or fevers, no weight changes Skin:no rashes or ulcers HEENT:no blurred vision, no congestion CV:see HPI PUL:see HPI GI:no diarrhea constipation or melena, no indigestion GU:no hematuria, no dysuria MS:no joint pain, no claudication Neuro:no syncope, no lightheadedness Endo:no diabetes, no thyroid disease  Wt Readings from Last 3 Encounters:  04/18/21 103 kg  04/04/21 103.4 kg  03/10/21 104.8 kg     PHYSICAL EXAM: VS:  BP (!) 142/60   Pulse 67   Ht 6' (1.829 m)   Wt 103 kg   SpO2 98%   BMI 30.79 kg/m  , BMI Body mass index is 30.79 kg/m. Affect appropriate Healthy:  appears stated age 53: normal Neck supple with no adenopathy Post right CEA  JVP normal no bruits no thyromegaly Lungs clear with no wheezing and good diaphragmatic motion Heart:  S1/S2 no murmur, no rub, gallop or click PMI normal Abdomen: benighn, BS positve, no tenderness, no AAA no bruit.  No HSM or HJR Distal pulses intact with no bruits Plus one bilateral edema with varicosities  Neuro mild dysarthria  Skin warm and dry No muscular weakness    EKG:   10/16/19 SR rate 70 nonspecific ST changes   Recent Labs: 04/03/2021: TSH 2.483 04/05/2021: Magnesium 2.0 04/07/2021: ALT 22; BUN 13; Creatinine, Ser 1.09; Hemoglobin 15.9; Platelets 197; Potassium 4.2; Sodium 138    Lipid Panel    Component Value Date/Time   CHOL  143 04/03/2021 1111   TRIG 130 04/03/2021 1111   HDL 31 (L) 04/03/2021 1111   CHOLHDL 4.6 04/03/2021 1111   VLDL 26 04/03/2021 1111   LDLCALC 86 04/03/2021 1111       Other studies Reviewed: Additional studies/ records that were reviewed today include: previous notes, nuc study.and carotid    ASSESSMENT AND PLAN:  1.  CAD s/p CABG 2005 Had no symptoms before CABG  Non ischemic myovue 09/09/18 continue medical Rx D/c plavix and start ASA 81 mg daily   2. Carotid:  Post right CEA recent thalamic stoke on plavix with  CTA suggesting significant left ICA stenosis but ? Not w/u. Will order f/u US and refer back to [facility]   3.  HTN  Controlled  On beta blocker and ARB   4. Hyperlipidemia stable on statin. Last LDL 86 needs repeat by primary on 40 mg Lipitor now   5. DM:  Discussed low carb diet.  Target hemoglobin A1c is 6.5 or less.  Continue current medications. He is getting Lab work in June with primary   Time spent reviewing hospital records, and all imaging modalities as well as consults direct patient interview, exam and composing note 45 minutes   F/U VVS F/U Cardiology 6 months  Carotid duplex    [name]

## 2021-04-07 NOTE — Evaluation (Addendum)
Occupational Therapy Assessment and Plan  Patient Details  Name: [name] [name] MRN: [id number] Date of Birth: Jan 29, 1941  OT Diagnosis: muscle weakness (generalized) Rehab Potential: Rehab Potential (ACUTE ONLY): Excellent ELOS: 5-7 days   Today's Date: 04/07/2021 OT Individual Time: 2878-6767 OT Individual Time Calculation (min): 59 min     Hospital Problem: Principal Problem:   Right thalamic infarction [facility]) Active Problems:   Essential hypertension   Type 2 diabetes mellitus ([place])   Unsteady gait   Past Medical History:  Past Medical History:  Diagnosis Date  . Basal cell carcinoma 11/09/2019   nod & infil-behind right ear-cx3 &exc  . Basal cell carcinoma 03/21/2020   Residual BCC with peripheral margin involved - ST recommends MOHs  . Carotid artery occlusion   . Coronary artery disease    s/p CABG 2005; sees Dr Johnsie Cancel yearly  . Gynecomastia   . Hypercholesterolemia   . Hypertension   . Neurodermatitis   . Overweight(278.02)   . Personal history of colonic polyps 10/08/2006   tubular adenomas  . Renal insufficiency   . Transient ischemic attack 2010   "lasted ~ 5 seconds"  . Vitamin D deficiency    Past Surgical History:  Past Surgical History:  Procedure Laterality Date  . CARDIAC CATHETERIZATION  02/2004   "tried to stent; couldn't"  . CAROTID ENDARTERECTOMY Right 08/26/2015  . CORONARY ANGIOPLASTY    . CORONARY ARTERY BYPASS GRAFT  Feb. 2005   4 vessel  . ENDARTERECTOMY Right 08/26/2015   Procedure: Right Carotid ENDARTERECTOMY with Patch Angioplasty ;  Surgeon: Rosetta Posner, MD;  Location: [place];  Service: Vascular;  Laterality: Right;  . 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 Dessie Coma  . KELOID EXCISION    . PILONIDAL CYST EXCISION  1989    Assessment & Plan Clinical Impression: Patient is a 80 y.o. year old male with recent admission to the hospital on 04/03/2021 with acute onset of left-sided weakness.  Cranial CT scan negative for acute  changes.  CT angiogram of head and neck showed no emergent large vessel occlusion.  Approximately 50% stenosis of the left cavernous internal carotid artery and approximately 70 to 80% stenosis of the proximal internal carotid artery just distal to the carotid bifurcation.  MRI of the brain showed small acute right thalamic infarction..  Patient transferred to CIR on 04/06/2021 .    Patient currently requires min with basic self-care skills secondary to muscle weakness and muscle paralysis, impaired timing and sequencing and decreased coordination and decreased standing balance and decreased balance strategies.  Prior to hospitalization, patient could complete ADLs with independent .  Patient will benefit from skilled intervention to decrease level of assist with basic self-care skills and increase independence with basic self-care skills prior to discharge home with care partner.  Anticipate patient will require intermittent supervision and no further OT follow recommended.  OT - End of Session Activity Tolerance: Improving Endurance Deficit: Yes OT Assessment Rehab Potential (ACUTE ONLY): Excellent OT Patient demonstrates impairments in the following area(s): Balance;Motor OT Basic ADL's Functional Problem(s): Grooming;Bathing;Dressing;Toileting OT Advanced ADL's Functional Problem(s): Simple Meal Preparation;Light Housekeeping OT Transfers Functional Problem(s): Toilet;Tub/Shower OT Additional Impairment(s): Fuctional Use of Upper Extremity OT Plan OT Intensity: Minimum of 1-2 x/day, 45 to 90 minutes OT Frequency: 5 out of 7 days OT Duration/Estimated Length of Stay: 5-7 days OT Treatment/Interventions: Balance/vestibular training;Discharge planning;Self Care/advanced ADL retraining;Therapeutic Activities;UE/LE Coordination activities;Functional mobility training;Patient/family education;Therapeutic Exercise;Disease mangement/prevention;Community reintegration;DME/adaptive equipment  instruction;Neuromuscular re-education;UE/LE Strength  taining/ROM OT Self Feeding Anticipated Outcome(s): independent OT Basic Self-Care Anticipated Outcome(s): modified independent OT Toileting Anticipated Outcome(s): modified independent OT Bathroom Transfers Anticipated Outcome(s): modified independent OT Recommendation Patient destination: Home Follow Up Recommendations: None Equipment Recommended: To be determined   OT Evaluation Precautions/Restrictions  Precautions Precautions: Fall Restrictions Weight Bearing Restrictions: No  Pain Pain Assessment Pain Scale: Faces Pain Score: 0-No pain Home Living/Prior Functioning Home Living Family/patient expects to be discharged to:: Private residence Living Arrangements: Spouse/significant other Available Help at Discharge: Family,Available 24 hours/day Type of Home: House Home Access: Stairs to enter [employer] of Steps: 2 Entrance Stairs-Rails: None (storm door on L to hold onto) Home Layout: One level  Lives With: Spouse Prior Function Level of Independence: Independent with basic ADLs,Independent with homemaking with ambulation,Independent with gait  Able to Take Stairs?: Yes Driving: Yes Comments: reports keeping a SPC in his car to use occasionally for longer distances in community Vision Baseline Vision/History: Cataracts Patient Visual Report: No change from baseline Vision Assessment?: Yes Eye Alignment: Within Functional Limits Ocular Range of Motion: Within Functional Limits Tracking/Visual Pursuits: Decreased smoothness of horizontal tracking Saccades: Within functional limits Convergence: Within functional limits Visual Fields: No apparent deficits Perception  Perception: Within Functional Limits Praxis Praxis: Intact Cognition Overall Cognitive Status: Within Functional Limits for tasks assessed Arousal/Alertness: Awake/alert Orientation Level: Person;Place;Situation Person:  Oriented Place: Oriented Situation: Oriented Year: 2022 Month: April Day of Week: Correct Memory: Appears intact Immediate Memory Recall: Sock;Blue;Bed Memory Recall Sock: Without Cue Memory Recall Blue: Without Cue Memory Recall Bed: Without Cue Attention: Sustained Sustained Attention: Appears intact Awareness: Appears intact Problem Solving: Appears intact Safety/Judgment: Appears intact Sensation Sensation Light Touch: Appears Intact Hot/Cold: Appears Intact Proprioception: Appears Intact Stereognosis: Appears Intact Additional Comments: Pt reports some numbness in his fingertips of the LUE. Coordination Gross Motor Movements are Fluid and Coordinated: Yes Fine Motor Movements are Fluid and Coordinated: Yes Finger Nose Finger Test: Slight slower movement on the left compared to the right. Motor  Motor Motor: Hemiplegia;Other (comment) Motor - Skilled Clinical Observations: mild L sided weakness (UE > LE); decreased balance strategies  Trunk/Postural Assessment  Cervical Assessment Cervical Assessment: Exceptions to WFL (Cervcial protraction at rest) Thoracic Assessment Thoracic Assessment: Exceptions to [facility] (thoracic kyphosis) Lumbar Assessment Lumbar Assessment: Exceptions to [facility] (posterior pelvic tilt)  Balance Balance Balance Assessed: Yes Static Sitting Balance Static Sitting - Balance Support: Feet supported Static Sitting - Level of Assistance: 5: Stand by assistance Dynamic Sitting Balance Dynamic Sitting - Balance Support: During functional activity Dynamic Sitting - Level of Assistance: 5: Stand by assistance Static Standing Balance Static Standing - Balance Support: During functional activity Static Standing - Level of Assistance:  (contact guard) Dynamic Standing Balance Dynamic Standing - Balance Support: During functional activity Dynamic Standing - Level of Assistance: 4: Min assist Extremity/Trunk Assessment RUE Assessment RUE Assessment:  Within Functional Limits LUE Assessment LUE Assessment: Exceptions to [facility] General Strength Comments: 4/5 throughout slight decreased coordination speed with finger to nose testing compared to the RUE, but uses it at a dominant level for selfcare tasks including shaving.  Care Tool Care Tool Self Care Eating   Eating Assist Level: Independent    Oral Care    Oral Care Assist Level: Contact Guard/Toucning assist    Bathing   Body parts bathed by patient: Right arm;Left arm;Chest;Abdomen;Front perineal area;Buttocks;Right upper leg;Left upper leg;Face;Left lower leg;Right lower leg     Assist Level: Contact Guard/Touching assist    Upper Body Dressing(including orthotics)  Assist Level: Set up assist    Lower Body Dressing (excluding footwear)   What is the patient wearing?: Underwear/pull up;Pants Assist for lower body dressing: Minimal Assistance - Patient > 75%    Putting on/Taking off footwear   What is the patient wearing?: Socks;Shoes Assist for footwear: Minimal Assistance - Patient > 75%       Care Tool Toileting Toileting activity   Assist for toileting: Minimal Assistance - Patient > 75%     Care Tool Bed Mobility Roll left and right activity   Roll left and right assist level: Supervision/Verbal cueing    Sit to lying activity   Sit to lying assist level: Minimal Assistance - Patient > 75%    Lying to sitting edge of bed activity   Lying to sitting edge of bed assist level: Minimal Assistance - Patient > 75%     Care Tool Transfers Sit to stand transfer   Sit to stand assist level: Minimal Assistance - Patient > 75%    Chair/bed transfer   Chair/bed transfer assist level: Minimal Assistance - Patient > 75%     Toilet transfer   Assist Level: Minimal Assistance - Patient > 75%     Care Tool Cognition Expression of Ideas and Wants Expression of Ideas and Wants: Without difficulty (complex and basic) - expresses complex messages without  difficulty and with speech that is clear and easy to understand   Understanding Verbal and Non-Verbal Content Understanding Verbal and Non-Verbal Content: Understands (complex and basic) - clear comprehension without cues or repetitions   Memory/Recall Ability *first 3 days only Memory/Recall Ability *first 3 days only: Current season;Location of own room;Staff names and faces;That he or she is in a hospital/hospital unit    Refer to Care Plan for [place] 1 OT Short Term Goal 1 (Week 1): Short term goals equal to LTGs set at modified independent level.  Recommendations for other services: None    Skilled Therapeutic Intervention ADL ADL Eating: Independent Where Assessed-Eating: Wheelchair Grooming: Contact guard Where Assessed-Grooming: Standing at sink Upper Body Bathing: Supervision/safety Where Assessed-Upper Body Bathing: Chair;Shower Lower Body Bathing: Contact guard Where Assessed-Lower Body Bathing: Chair;Shower Upper Body Dressing: Contact guard Where Assessed-Upper Body Dressing: Standing at sink Lower Body Dressing: Minimal assistance Where Assessed-Lower Body Dressing: Sitting at sink;Standing at sink;Chair Toileting: Minimal assistance Where Assessed-Toileting: Glass blower/designer: Psychiatric nurse Method: Counselling psychologist: Raised toilet seat;Grab bars Tub/Shower Transfer Method: Magazine features editor: Environmental education officer Method: Heritage manager: Primary school teacher Sit to Stand: Contact Guard/Touching assist Stand to Sit: Contact Guard/Touching assist   Session Note  Pt completed shower and dressing during session.  He was able to stand with use of the grab bars for removal of his underpants and pants prior to shower.  Bathing sit to stand with use of the grab bar for support when standing.  He was able to complete  most dressing with min guard except for donning socks, which required mod assist.  He reported difficulty with this previously and may benefit from a sockaide for use in the future.  He was able to complete grooming tasks, including shaving at the sink in standing with min guard assist as well before finishing session sitting in the wheelchair with the call button and phone in reach and safety alarm in place.     Discharge Criteria: Patient will be discharged from OT  if patient refuses treatment 3 consecutive times without medical reason, if treatment goals not met, if there is a change in medical status, if patient makes no progress towards goals or if patient is discharged from hospital.  The above assessment, treatment plan, treatment alternatives and goals were discussed and mutually agreed upon: by patient  Jaylene Schrom OTR/L 04/07/2021, 5:04 PM

## 2021-04-07 NOTE — Evaluation (Signed)
Speech Language Pathology Assessment and Plan  Patient Details  Name: [name] [name] MRN: [id number] Date of Birth: Jan 08, 1941  SLP Diagnosis:    Rehab Potential:   ELOS:      Today's Date: 04/07/2021 SLP Individual Time: 1305-1400 SLP Individual Time Calculation (min): 76 min   Hospital Problem: Principal Problem:   Right thalamic infarction [facility]) Active Problems:   Essential hypertension   Type 2 diabetes mellitus ([place])   Unsteady gait  Past Medical History:  Past Medical History:  Diagnosis Date  . Basal cell carcinoma 11/09/2019   nod & infil-behind right ear-cx3 &exc  . Basal cell carcinoma 03/21/2020   Residual BCC with peripheral margin involved - ST recommends MOHs  . Carotid artery occlusion   . Coronary artery disease    s/p CABG 2005; sees Dr Johnsie Cancel yearly  . Gynecomastia   . Hypercholesterolemia   . Hypertension   . Neurodermatitis   . Overweight(278.02)   . Personal history of colonic polyps 10/08/2006   tubular adenomas  . Renal insufficiency   . Transient ischemic attack 2010   "lasted ~ 5 seconds"  . Vitamin D deficiency    Past Surgical History:  Past Surgical History:  Procedure Laterality Date  . CARDIAC CATHETERIZATION  02/2004   "tried to stent; couldn't"  . CAROTID ENDARTERECTOMY Right 08/26/2015  . CORONARY ANGIOPLASTY    . CORONARY ARTERY BYPASS GRAFT  Feb. 2005   4 vessel  . ENDARTERECTOMY Right 08/26/2015   Procedure: Right Carotid ENDARTERECTOMY with Patch Angioplasty ;  Surgeon: Rosetta Posner, MD;  Location: [place];  Service: Vascular;  Laterality: Right;  . 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 Dessie Coma  . KELOID EXCISION    . PILONIDAL CYST EXCISION  1989    Assessment / Plan / Recommendation Clinical Impression Patient is a 80 year old right-handed male with history of TIA, hypertension, hyperlipidemia, CAD with CABG 2005 maintained on aspirin, right CEA and questionable medical compliance.   Presented 04/03/2021 with  acute onset of left-sided weakness.  MRI of the brain showed small acute right thalamic infarction.  Tolerating a regular diet.  Therapy evaluations completed due to patient's decreased functional ability left side weakness recommendations of physical medicine rehab consult patient was admitted for a comprehensive rehab program 04/06/21.  Patient's overall cognitive-linguistic functioning appears [facility] for all tasks assessed. Patient reports mild left facial numbness, however, overall ROM and strength appeared [facility], [place] and patient was 100% intelligible at the conversation level. Patient's overall higher-level expressive and receptive abilities were [facility] without evidence of word-finding deficits, however, patient is hard of hearing which can impact comprehension at times. SLP administered the [facility] Mental Status Examination (SLUMS) and patient scored  29/30 points with a score of 27 or above considered normal. Patient's wife present and also reports she feels patient is at his cognitive-linguistic baseline. Therefore, skilled SLP intervention is not warranted at this time. Patient and his wife verbalized understanding and agreement.     Skilled Therapeutic Interventions          Administered a cognitive-linguistic evaluation, please see above for details.   SLP Assessment  Patient does not need any further Speech Lanaguage Pathology Services    Recommendations  Oral Care Recommendations: Oral care BID Patient destination: Home Follow up Recommendations: None Equipment Recommended: None recommended by SLP    SLP Frequency N/A  SLP Duration  SLP Intensity  SLP Treatment/Interventions N/A  N/A  N/A   Pain No/Denies Pain  SLP Evaluation Cognition Overall Cognitive Status: Within Functional Limits for tasks assessed Arousal/Alertness: Awake/alert Orientation Level: Oriented X4 Memory: Appears intact Awareness: Appears intact Problem Solving: Appears intact Safety/Judgment:  Appears intact  Comprehension Auditory Comprehension Overall Auditory Comprehension: Appears within functional limits for tasks assessed Visual Recognition/Discrimination Discrimination: Within Function Limits Reading Comprehension Reading Status: Within funtional limits Expression Expression Primary Mode of Expression: Verbal Verbal Expression Overall Verbal Expression: Appears within functional limits for tasks assessed Written Expression Dominant Hand: Left Written Expression: Within Functional Limits Oral Motor Oral Motor/Sensory Function Overall Oral Motor/Sensory Function: Within functional limits Motor Speech Overall Motor Speech: Appears within functional limits for tasks assessed  Care Tool Care Tool Cognition Expression of Ideas and Wants Expression of Ideas and Wants: Without difficulty (complex and basic) - expresses complex messages without difficulty and with speech that is clear and easy to understand   Understanding Verbal and Non-Verbal Content Understanding Verbal and Non-Verbal Content: Understands (complex and basic) - clear comprehension without cues or repetitions   Memory/Recall Ability *first 3 days only Memory/Recall Ability *first 3 days only: Current season;Location of own room;Staff names and faces;That he or she is in a hospital/hospital unit     Short Term Goals: N/A   Refer to Care Plan for Long Term Goals  Recommendations for other services: None   Discharge Criteria: Patient will be discharged from SLP if patient refuses treatment 3 consecutive times without medical reason, if treatment goals not met, if there is a change in medical status, if patient makes no progress towards goals or if patient is discharged from hospital.  The above assessment, treatment plan, treatment alternatives and goals were discussed and mutually agreed upon: by patient and by family  Veryl Abril 04/07/2021, 4:01 PM

## 2021-04-07 NOTE — Progress Notes (Signed)
PROGRESS NOTE   Subjective/Complaints:    Objective:   No results found. Recent Labs    04/06/21 1346 04/07/21 0524  WBC 6.8 7.1  HGB 15.9 15.9  HCT 45.1 44.7  PLT 204 197   Recent Labs    04/05/21 0336 04/06/21 1346 04/07/21 0524  NA 138  --  138  K 3.9  --  4.2  CL 105  --  105  CO2 28  --  29  GLUCOSE 119*  --  118*  BUN 10  --  13  CREATININE 1.09 1.04 1.09  CALCIUM 8.3*  --  8.5*    Intake/Output Summary (Last 24 hours) at 04/07/2021 0728 Last data filed at 04/07/2021 0658 Gross per 24 hour  Intake 200 ml  Output 1800 ml  Net -1600 ml        Physical Exam: Vital Signs Blood pressure 130/69, pulse (!) 57, temperature 98.1 F (36.7 C), resp. rate 16, SpO2 96 %.   General: No acute distress Mood and affect are appropriate Heart: Regular rate and rhythm no rubs murmurs or extra sounds Lungs: Clear to auscultation, breathing unlabored, no rales or wheezes Abdomen: Positive bowel sounds, soft nontender to palpation, nondistended Extremities: No clubbing, cyanosis, or edema Skin: No evidence of breakdown, no evidence of rash Neurologic: Cranial nerves II through XII intact, motor strength is 5/5 in R and 4/5 L deltoid, bicep, tricep, grip, hip flexor, knee extensors, ankle dorsiflexor and plantar flexor Sensory exam normal sensation to light touch and proprioception in bilateral upper and lower extremities Cerebellar exam normal finger to nose to finger Musculoskeletal: Full range of motion in all 4 extremities. No joint swelling   Assessment/Plan: 1. Functional deficits which require 3+ hours per day of interdisciplinary therapy in a comprehensive inpatient rehab setting.  Physiatrist is providing close team supervision and 24 hour management of active medical problems listed below.  Physiatrist and rehab team continue to assess barriers to discharge/monitor patient progress toward functional and  medical goals  Care Tool:  Bathing              Bathing assist       Upper Body Dressing/Undressing Upper body dressing   What is the patient wearing?: Hospital gown only    Upper body assist Assist Level: Minimal Assistance - Patient > 75%    Lower Body Dressing/Undressing Lower body dressing      What is the patient wearing?: Incontinence brief     Lower body assist Assist for lower body dressing: Moderate Assistance - Patient 50 - 74%     Toileting Toileting    Toileting assist Assist for toileting: Minimal Assistance - Patient > 75% (Patient used urinal, helped to unfasten brief for him.)     Transfers Chair/bed transfer  Transfers assist           Locomotion Ambulation   Ambulation assist              Walk 10 feet activity   Assist           Walk 50 feet activity   Assist           Walk 150 feet  activity   Assist           Walk 10 feet on uneven surface  activity   Assist           Wheelchair     Assist               Wheelchair 50 feet with 2 turns activity    Assist            Wheelchair 150 feet activity     Assist          Blood pressure 130/69, pulse (!) 57, temperature 98.1 F (36.7 C), resp. rate 16, SpO2 96 %.   Medical Problem List and Plan: 1.  Left facial droop and impaired balance/and impaired movements due to ataxia/apraxiasecondary to right thalamic infarction likely small vessel disease             -patient may  shower             -ELOS/Goals: 10-14 days- mod I to supervision 2.  Antithrombotics: -DVT/anticoagulation: Lovenox             -antiplatelet therapy: Aspirin 81 mg daily and Plavix 75 mg daily x3 weeks then Plavix alone 3. Pain Management: Tylenol as needed 4. Mood: Provide emotional support             -antipsychotic agents: N/A 5. Neuropsych: This patient is capable of making decisions on his own behalf. 6. Skin/Wound Care: Routine skin  checks 7. Fluids/Electrolytes/Nutrition: Routine in and outs with follow-up chemistries 8.  Hypertension.  Coreg 3.125 mg twice daily Cozaar 50 mg daily..  Monitor with increased mobility Vitals:   04/06/21 1950 04/07/21 0434  BP: 124/75 130/69  Pulse: 62 (!) 57  Resp: 16 16  Temp: 98.2 F (36.8 C) 98.1 F (36.7 C)  SpO2: 94% 96%   9.  Hyperlipidemia.  Lipitor 10.  History of CAD with CABG.  Continue aspirin and Plavix.  No chest pain or shortness of breath 11.  Type 2 diabetes mellitus.  Hemoglobin A1c 6.0.  Diabetic diet CBG (last 3)  No results for input(s): GLUCAP in the last 72 hours.   12.  Questionable medical compliance.  Provide counseling 13. Dry skin- will order eucerin BID and d/c IV since not using.     LOS: 1 days A FACE TO FACE EVALUATION WAS PERFORMED  Charlett Blake 04/07/2021, 7:28 AM

## 2021-04-07 NOTE — Evaluation (Signed)
Physical Therapy Assessment and Plan  Patient Details  Name: [name] [name] MRN: [id number] Date of Birth: 1941-02-05  PT Diagnosis: Difficulty walking, Hemiparesis dominant, Impaired sensation and Muscle weakness Rehab Potential: Good ELOS: 7-10 days   Today's Date: 04/07/2021 PT Individual Time: 0828-0930 PT Individual Time Calculation (min): 62 min    Hospital Problem: Principal Problem:   Right thalamic infarction [facility]) Active Problems:   Essential hypertension   Type 2 diabetes mellitus ([place])   Unsteady gait   Past Medical History:  Past Medical History:  Diagnosis Date  . Basal cell carcinoma 11/09/2019   nod & infil-behind right ear-cx3 &exc  . Basal cell carcinoma 03/21/2020   Residual BCC with peripheral margin involved - ST recommends MOHs  . Carotid artery occlusion   . Coronary artery disease    s/p CABG 2005; sees Dr Johnsie Cancel yearly  . Gynecomastia   . Hypercholesterolemia   . Hypertension   . Neurodermatitis   . Overweight(278.02)   . Personal history of colonic polyps 10/08/2006   tubular adenomas  . Renal insufficiency   . Transient ischemic attack 2010   "lasted ~ 5 seconds"  . Vitamin D deficiency    Past Surgical History:  Past Surgical History:  Procedure Laterality Date  . CARDIAC CATHETERIZATION  02/2004   "tried to stent; couldn't"  . CAROTID ENDARTERECTOMY Right 08/26/2015  . CORONARY ANGIOPLASTY    . CORONARY ARTERY BYPASS GRAFT  Feb. 2005   4 vessel  . ENDARTERECTOMY Right 08/26/2015   Procedure: Right Carotid ENDARTERECTOMY with Patch Angioplasty ;  Surgeon: Rosetta Posner, MD;  Location: [facility];  Service: Vascular;  Laterality: Right;  . 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 Dessie Coma  . KELOID EXCISION    . PILONIDAL CYST EXCISION  1989    Assessment & Plan Clinical Impression: Patient is a 80 year old left-handed male with history of TIA, hypertension, hyperlipidemia, CAD with CABG 2005 maintained on aspirin, right CEA and  questionable medical compliance.  Per chart review patient lives with spouse.  Independent prior to admission using an assistive device.  1 level home 3 steps to entry.  Presented 04/03/2021 with acute onset of left-sided weakness.  Cranial CT scan negative for acute changes.  CT angiogram of head and neck showed no emergent large vessel occlusion.  Approximately 50% stenosis of the left cavernous internal carotid artery and approximately 70 to 80% stenosis of the proximal internal carotid artery just distal to the carotid bifurcation.  MRI of the brain showed small acute right thalamic infarction.  Echocardiogram with ejection fraction of 60 to 65% no wall motion abnormalities.  Admission chemistries unremarkable except glucose 190 creatinine 1.25 alcohol negative urine drug screen negative.  Currently maintained on aspirin Plavix for CVA prophylaxis x3 weeks then Plavix alone.  Subcutaneous Lovenox for DVT prophylaxis.  Tolerating a regular diet.  Therapy evaluations completed due to patient's decreased functional ability left side weakness recommendations of physical medicine rehab consult Patient transferred to CIR on 04/06/2021 .   Patient currently requires min with mobility secondary to muscle weakness and muscle joint tightness, decreased cardiorespiratoy endurance, unbalanced muscle activation and decreased sitting balance, decreased standing balance, decreased postural control, hemiplegia and decreased balance strategies.  Prior to hospitalization, patient was independent  with mobility and lived with Spouse in a House home.  Home access is 2Stairs to enter.  Patient will benefit from skilled PT intervention to maximize safe functional mobility, minimize fall risk and decrease caregiver burden  for planned discharge home with 24 hour supervision.  Anticipate patient will benefit from follow up OP at discharge.  PT - End of Session Activity Tolerance: Tolerates 30+ min activity with multiple  rests Endurance Deficit: Yes PT Assessment Rehab Potential (ACUTE/IP ONLY): Good PT Patient demonstrates impairments in the following area(s): Balance;Endurance;Motor;Safety;Sensory PT Transfers Functional Problem(s): Bed Mobility;Bed to Chair;Car;Furniture PT Locomotion Functional Problem(s): Ambulation;Stairs PT Plan PT Intensity: Minimum of 1-2 x/day ,45 to 90 minutes PT Frequency: 5 out of 7 days PT Duration Estimated Length of Stay: 7-10 days PT Treatment/Interventions: Ambulation/gait training;Balance/vestibular training;Cognitive remediation/compensation;Community reintegration;Discharge planning;Disease management/prevention;DME/adaptive equipment instruction;Functional mobility training;Neuromuscular re-education;Pain management;Patient/family education;Psychosocial support;Splinting/orthotics;Skin care/wound management;Stair training;Therapeutic Activities;Therapeutic Exercise;UE/LE Strength taining/ROM;UE/LE Coordination activities;Wheelchair propulsion/positioning PT Transfers Anticipated Outcome(s): mod I transfers and supervision car transfer PT Locomotion Anticipated Outcome(s): mod I household distances; supervision longer distances and stairs PT Recommendation Follow Up Recommendations: Outpatient PT Patient destination: Home Equipment Recommended: To be determined (pt has access to [facility] and RW at home)   PT Evaluation Precautions/Restrictions Precautions Precautions: Fall Restrictions Weight Bearing Restrictions: No Pain Pain Assessment Pain Scale: 0-10 Pain Score: 0-No pain Home Living/Prior Functioning Home Living Available Help at Discharge: Family;Available 24 hours/day Type of Home: House Home Access: Stairs to enter [employer] of Steps: 2 Entrance Stairs-Rails: None (storm door on L to hold onto) Home Layout: One level  Lives With: Spouse Prior Function Level of Independence: Independent with basic ADLs;Independent with homemaking with  ambulation;Independent with gait  Able to Take Stairs?: Yes Driving: Yes Comments: reports keeping a SPC in his car to use occasionally for longer distances in community Vision/Perception  Perception Perception: Within Functional Limits Praxis Praxis: Intact  Cognition Overall Cognitive Status: Within Functional Limits for tasks assessed Safety/Judgment: Appears intact Sensation Sensation Light Touch: Appears Intact (BLE; reports some tingling in L fingertips) Proprioception: Appears Intact Coordination Gross Motor Movements are Fluid and Coordinated: Yes Motor  Motor Motor: Hemiplegia;Other (comment) Motor - Skilled Clinical Observations: mild L sided weakness (UE > LE); decreased balance strategies   Trunk/Postural Assessment  Cervical Assessment Cervical Assessment:  (forward head) Thoracic Assessment Thoracic Assessment:  (mild kyphosis) Lumbar Assessment Lumbar Assessment:  (decreased lumbar and pelvic mobility) Postural Control Postural Control: Deficits on evaluation (decreased balance strategies; reports improving L lean)  Balance Balance Balance Assessed: Yes Standardized Balance Assessment Standardized Balance Assessment: Berg Balance Test Berg Balance Test Sit to Stand: Able to stand  independently using hands Standing Unsupported: Able to stand 2 minutes with supervision Sitting with Back Unsupported but Feet Supported on Floor or Stool: Able to sit safely and securely 2 minutes Stand to Sit: Controls descent by using hands Transfers: Needs one person to assist Standing Unsupported with Eyes Closed: Able to stand 10 seconds with supervision Standing Ubsupported with Feet Together: Needs help to attain position but able to stand for 30 seconds with feet together From Standing, Reach Forward with Outstretched Arm: Can reach forward >12 cm safely (5") From Standing Position, Pick up Object from Floor: Able to pick up shoe, needs supervision From Standing  Position, Turn to Look Behind Over each Shoulder: Looks behind one side only/other side shows less weight shift Turn 360 Degrees: Needs close supervision or verbal cueing Standing Unsupported, Alternately Place Feet on Step/Stool: Able to complete >2 steps/needs minimal assist Standing Unsupported, One Foot in Front: Needs help to step but can hold 15 seconds Standing on One Leg: Tries to lift leg/unable to hold 3 seconds but remains standing independently Total Score: 31 Static Sitting  Balance Static Sitting - Level of Assistance: 5: Stand by assistance Dynamic Sitting Balance Dynamic Sitting - Level of Assistance: 5: Stand by assistance Static Standing Balance Static Standing - Level of Assistance: 4: Min assist Dynamic Standing Balance Dynamic Standing - Level of Assistance: 4: Min assist;3: Mod assist Extremity Assessment      RLE Assessment RLE Assessment: Exceptions to [facility] Active Range of Motion (AROM) Comments: hamstring tightness limiting ROM General Strength Comments: grossly 4-/5 LLE Assessment LLE Assessment: Exceptions to [facility] Active Range of Motion (AROM) Comments: hamstring tightness limiting ROM General Strength Comments: grossly 3+ to 4-/5  Care Tool Care Tool Bed Mobility Roll left and right activity   Roll left and right assist level: Supervision/Verbal cueing    Sit to lying activity   Sit to lying assist level: Minimal Assistance - Patient > 75%    Lying to sitting edge of bed activity   Lying to sitting edge of bed assist level: Minimal Assistance - Patient > 75%     Care Tool Transfers Sit to stand transfer   Sit to stand assist level: Minimal Assistance - Patient > 75%    Chair/bed transfer   Chair/bed transfer assist level: Minimal Assistance - Patient > 75%     Toilet transfer   Assist Level: Minimal Assistance - Patient > 75%    Car transfer   Car transfer assist level: Minimal Assistance - Patient > 75%      Care Tool  Locomotion Ambulation   Assist level: Minimal Assistance - Patient > 75% Assistive device: No Device Max distance: 65'  Walk 10 feet activity   Assist level: Minimal Assistance - Patient > 75% Assistive device: No Device   Walk 50 feet with 2 turns activity   Assist level: Minimal Assistance - Patient > 75% Assistive device: No Device  Walk 150 feet activity Walk 150 feet activity did not occur: Safety/medical concerns      Walk 10 feet on uneven surfaces activity   Assist level: Minimal Assistance - Patient > 75% Assistive device: Hand held assist  Stairs   Assist level: Minimal Assistance - Patient > 75% Stairs assistive device: 1 hand rail;2 hand rails Max number of stairs: 12  Walk up/down 1 step activity   Walk up/down 1 step (curb) assist level: Minimal Assistance - Patient > 75% Walk up/down 1 step or curb assistive device: 2 hand rails;1 hand rail    Walk up/down 4 steps activity Walk up/down 4 steps assist level: Minimal Assistance - Patient > 75% Walk up/down 4 steps assistive device: 2 hand rails;1 hand rail  Walk up/down 12 steps activity   Walk up/down 12 steps assist level: Minimal Assistance - Patient > 75% Walk up/down 12 steps assistive device: 2 hand rails;1 hand rail  Pick up small objects from floor   Pick up small object from the floor assist level: Contact Guard/Touching assist    Wheelchair Will patient use wheelchair at discharge?: No          Wheel 50 feet with 2 turns activity      Wheel 150 feet activity        Refer to Care Plan for [place] [facility] PT Short Term Goal 1 (Week 1): = LTGs  Recommendations for other services: None   Skilled Therapeutic Intervention Mobility Bed Mobility Bed Mobility: Rolling Right;Supine to Sit;Sit to Supine Rolling Right: Supervision/verbal cueing Supine to Sit: Minimal Assistance - Patient > 75% Sit to  Supine: Minimal Assistance - Patient > 75% Transfers Transfers: Sit to  Stand;Stand to Sit;Stand Pivot Transfers Sit to Stand: Minimal Assistance - Patient > 75% Stand to Sit: Minimal Assistance - Patient > 75% Transfer (Assistive device): None Locomotion  Gait Gait Distance (Feet): 65 Feet Assistive device: None Gait Gait Pattern: Impaired Feet externally rotated during gait and decreased R foot clearance noted; short step length bilaterally (reports this is premorbid); slightly flexed posture Stairs / Additional Locomotion Stairs: Yes Stairs Assistance: Minimal Assistance - Patient > 75% Stair Management Technique: One rail Right;One rail Left;Two rails;Alternating pattern Number of Stairs: 12 Height of Stairs:  (3" and 6") Ramp: Minimal Assistance - Patient >75% Curb: Minimal Assistance - Patient >75% Wheelchair Mobility Wheelchair Mobility: No   Start of session pt required min facilitation at trunk for muscular activation for supine to sit vs use of momentum and bed rails for support. Initial sit > stands with min assist for facilitation of anterior weightshift and stabilization of balance but progressed to CGA throughout session with improved technique noted. Session focused on functional mobility without AD to challenge balance and educated patient on this purpose as well when asked why not using a RW yet. In room gait without AD with overall min assist for balance and pt performed toileting at min/CGA on standard toilet including clothing management of gown. Donned pants, underwear and shirt EOB with set up assistance and for time management PT assisted with threading of pants and donning of shoes and socks. Pt able to pull up pants and maintain balance with min assist overall. Stood at sink for oral hygiene and grooming x 6 min with close supervision and intermittent UE support on countertop.   During stair negotiation pt self selected reciprocal gait pattern and demonstrates safe technique with occasional min assist for balance.  Discussed Berg  balance test results with patient and carryover of balance training. During gait training throughout session without device up to about 65', did not pt to have demonstrated decreased R foot clearance to which pt reports this is premorbid. Will need continued assessment as may contribute to increased fall risk. Pt verbalized understanding and also discussed potential recommendation for use of AD to increase safe independence with mobility upon d/c.   Pt eager to progress and will benefit from HEP available to him in room to work on.    Discharge Criteria: Patient will be discharged from PT if patient refuses treatment 3 consecutive times without medical reason, if treatment goals not met, if there is a change in medical status, if patient makes no progress towards goals or if patient is discharged from hospital.  The above assessment, treatment plan, treatment alternatives and goals were discussed and mutually agreed upon: by patient  Juanna Cao, PT, DPT, CBIS  04/07/2021, 10:19 AM

## 2021-04-08 NOTE — Progress Notes (Signed)
Occupational Therapy Session Note  Patient Details  Name: [name] [name] MRN: [id number] Date of Birth: [birth year]  Today's Date: 04/08/2021 OT Individual Time: 4315-4008 OT Individual Time Calculation (min): 60 min    Short Term Goals: Week 1:  OT Short Term Goal 1 (Week 1): Short term goals equal to LTGs set at modified independent level.  Skilled Therapeutic Interventions/Progress Updates:    Pt received in bed dressed and ready for the day. He stated he had already toileted and brushed his teeth.  Pt eager and ready to start therapy.  He ambulated to the gym without AD and with CGA.   In gym, focused on dynamic balance and LE strength to improve his balance. He worked on several sit to stands holding a 4 lb dowel bar and on each stand reached arms over head and then lowered back into a squat. Used dowel bar for UE strengthening with chest presses, rows, bicep curls.  Stood at side of // bars to work on heel raises, calf stretches, hip abduction and hip extension.  Pt did an excellent job with the exercises.  Ambulated back to his room with CGA.  Opted to rest in bed. Alarm set and all needs met.   Therapy Documentation Precautions:  Precautions Precautions: Fall Restrictions Weight Bearing Restrictions: No       Pain: Pain Assessment Pain Score: 0-No pain  Therapy/Group: Individual Therapy  [place] 04/08/2021, 12:52 PM

## 2021-04-08 NOTE — Progress Notes (Addendum)
Physical Therapy Session Note  Patient Details  Name: [name] [name] MRN: [id number] Date of Birth: 1941/06/08  Today's Date: 04/08/2021 PT Individual Time: 1112-1205 PT Individual Time Calculation (min): 53 min   Short Term Goals: Week 1:  PT Short Term Goal 1 (Week 1): = LTGs  Skilled Therapeutic Interventions/Progress Updates:    Session focused on functional gait without AD to increase challenge to balance during functional mobility retraining,  NMR for balance retraining including toe taps to alternating step with focus on increasing speed and accuracy, issuing HEP for LE strengthening and stretching program, stair negotiation for home entry practice with single rail and without rails to simulate home environment, and education in regards to progress, recovery and d/c planning discussion.  Pt performed mobility at overall CGA throughout session without AD. Does note during gait to have decreased foot clearance at times bilaterally but usually with R more than L (premorbid) and cues for attention to heel strike with some improvement noted. Performed and issued stretching exercises for tight hamstrings and heel cords (seated hamstring stretch and standing wall supported gastroc/soleus stretch x 3 reps each BLE x 30-45 sec hold) and educated on importance of ROM/stretching program. Added BLE strengthening exercises he performed with OT earlier this morning to his handout for HEP to take home.  Stair negotiation with single rail on L and thenwithout rail to simulate home environment access x 8 total with CGA overall and cues for slowing movement to allow for foot clearance and accurate placement.  Five times Sit to Stand Test (FTSS) Method: Use a straight back chair with a solid seat that is 16-18" high. Ask participant to sit on the chair with arms folded across their chest.   Instructions: "Stand up and sit down as quickly as possible 5 times, keeping your arms folded across your chest."    Measurement: Stop timing when the participant stands the 5th time.  TIME: ___16___ (in seconds)  - noted he used backs of legs against for support  Times > 13.6 seconds is associated with increased disability and morbidity (Guralnik, 2000) Times > 15 seconds is predictive of recurrent falls in healthy individuals aged 3 and older (Buatois, et al., 2008) Normal performance values in community dwelling individuals aged 58 and older (Bohannon, 2006): o 60-69 years: 11.4 seconds o 70-79 years: 12.6 seconds o 80-89 years: 14.8 seconds  MCID: ? 2.3 seconds for Vestibular Disorders (Meretta, 2006)  Pt reports he is eager to go home and wants to make sure MD is aware of his progress. Educated on our documentation and discussion with the physician and treatment team on a daily basis.  Wife present in room at end of session and education provided to her as well in regards to mobility with and without RW at this time. Will continue to assess whether/which AD is recommended at d/c   Therapy Documentation Precautions:  Precautions Precautions: Fall Restrictions Weight Bearing Restrictions: No    Pain:  Denies pain    Therapy/Group: Individual Therapy  Canary Brim Ivory Broad, PT, DPT, CBIS  04/08/2021, 12:25 PM

## 2021-04-08 NOTE — Progress Notes (Signed)
Physical Therapy Session Note  Patient Details  Name: [name] [name] MRN: [id number] Date of Birth: 08/08/1941  Today's Date: 04/08/2021 PT Individual Time: 1630-1745 PT Individual Time Calculation (min): 75 min   Short Term Goals: Week 1:  PT Short Term Goal 1 (Week 1): = LTGs  Skilled Therapeutic Interventions/Progress Updates:    Pt received sitting in recliner with his wife present and pt eager to participate in therapy session. Discussed D/C planning with recommendation for follow-up OPPT. Sit<>stands, no AD, CGA progressing towards supervision during session - noticed to use backs of legs against seat when coming to stand despite pt having sufficient strength to perform without this compensation, educated pt on avoiding pushing backs of legs on chairs in his daily routines as it has become a habit. Gait training ~147ft to main therapy gym, no AD, CGA/min assist for steadying - demos good B LE foot clearance; however, significant R LE hip ER throughout gait with pt reporting this is baseline.   Donned maxi-sky sling for safety during the following dynamic gait challenges:  -  R/L side stepping through agility ladder - demos more compensatory pelvic rotation stepping towards L compared to R  - forward stepping through agility ladder  - backwards stepping through agility ladder 2 feet per square with more instability leading with R LE compared to L  - dynamic gait including sudden R/L turns, backwards walking, sudden start/stops, and R/L lateral side stepping   Repeated sit<>stands to/from slightly elevated EOM, no UE support, x10 reps with cuing not to use backs of legs on mat - cuing for improved anterior trunk flexion/weight shift to prevent slight posterior lean upon coming to stand.   Gait training ~14ft to/from stairs with pt demonstrating some increased fatigue resulting in decreased B LE foot clearance - noted to have decreased R LE hip extension at terminal stance resulting in  early initiation of swing phase - continues to demo above impairments. Ascended/descended 4 steps 2x using no HRs on ascent with reciprocal pattern and CGA then only R HR on descent to simulate home environment with step-to and CGA.   Sit<>supine on mat supervision. Supine R LE hip flexor stretch 2x1 minute. Supine bridging 2x15 reps with adductor ball squeeze in 2nd set due to excessive hip external rotation. Gait training ~138ft back to room, no AD, CGA/min assist with pt noted to have above impairments and impaired ability to stop forward ambulation suddenly when object moved into walking path with forward shuffled steps to regain balance. Therapist educated pt/family that may recommend AD use at D/C to increase pt safety and independence along with decreased fall risk pending balance improvement during CIR stay. Pt left sitting in recliner with needs in reach, chair alarm on, and pt's wife present.   Therapy Documentation Precautions:  Precautions Precautions: Fall Restrictions Weight Bearing Restrictions: No  Pain: No reports of pain throughout session.   Therapy/Group: Individual Therapy  Tawana Scale , PT, DPT, CSRS  04/08/2021, 3:32 PM

## 2021-04-09 DIAGNOSIS — I639 Cerebral infarction, unspecified: Secondary | ICD-10-CM | POA: Diagnosis not present

## 2021-04-09 NOTE — Progress Notes (Addendum)
PROGRESS NOTE   Subjective/Complaints:  No issues overnite, appreciate SLP eval no need for further tx  ROS- neg CP, SOB, N/V/D  Objective:   No results found. Recent Labs    04/06/21 1346 04/07/21 0524  WBC 6.8 7.1  HGB 15.9 15.9  HCT 45.1 44.7  PLT 204 197   Recent Labs    04/06/21 1346 04/07/21 0524  NA  --  138  K  --  4.2  CL  --  105  CO2  --  29  GLUCOSE  --  118*  BUN  --  13  CREATININE 1.04 1.09  CALCIUM  --  8.5*    Intake/Output Summary (Last 24 hours) at 04/09/2021 4166 Last data filed at 04/09/2021 0414 Gross per 24 hour  Intake 118 ml  Output 350 ml  Net -232 ml        Physical Exam: Vital Signs Blood pressure 129/71, pulse 67, temperature 98.2 F (36.8 C), temperature source Oral, resp. rate 14, SpO2 94 %.  General: No acute distress Mood and affect are appropriate Heart: Regular rate and rhythm no rubs murmurs or extra sounds Lungs: Clear to auscultation, breathing unlabored, no rales or wheezes Abdomen: Positive bowel sounds, soft nontender to palpation, nondistended Extremities: No clubbing, cyanosis, or edema Skin: No evidence of breakdown, no evidence of rash  Neurologic: Cranial nerves II through XII intact, motor strength is 5/5 in R and 4/5 L deltoid, bicep, tricep, grip, hip flexor, knee extensors, ankle dorsiflexor and plantar flexor Sensory exam normal sensation to light touch and proprioception in bilateral upper and lower extremities Cerebellar exam normal finger to nose to finger Musculoskeletal: Full range of motion in all 4 extremities. No joint swelling   Assessment/Plan: 1. Functional deficits which require 3+ hours per day of interdisciplinary therapy in a comprehensive inpatient rehab setting.  Physiatrist is providing close team supervision and 24 hour management of active medical problems listed below.  Physiatrist and rehab team continue to assess barriers  to discharge/monitor patient progress toward functional and medical goals  Care Tool:  Bathing    Body parts bathed by patient: Right arm,Left arm,Chest,Abdomen,Front perineal area,Buttocks,Right upper leg,Left upper leg,Face,Left lower leg,Right lower leg         Bathing assist Assist Level: Contact Guard/Touching assist     Upper Body Dressing/Undressing Upper body dressing   What is the patient wearing?: Hospital gown only    Upper body assist Assist Level: Set up assist    Lower Body Dressing/Undressing Lower body dressing      What is the patient wearing?: Pants,Underwear/pull up     Lower body assist Assist for lower body dressing: Minimal Assistance - Patient > 75%     Toileting Toileting    Toileting assist Assist for toileting: Supervision/Verbal cueing     Transfers Chair/bed transfer  Transfers assist     Chair/bed transfer assist level: Contact Guard/Touching assist     Locomotion Ambulation   Ambulation assist      Assist level: Contact Guard/Touching assist Assistive device: No Device Max distance: 143ft   Walk 10 feet activity   Assist     Assist level: Contact Guard/Touching assist  Assistive device: No Device   Walk 50 feet activity   Assist    Assist level: Contact Guard/Touching assist Assistive device: No Device    Walk 150 feet activity   Assist Walk 150 feet activity did not occur: Safety/medical concerns  Assist level: Minimal Assistance - Patient > 75% Assistive device: No Device    Walk 10 feet on uneven surface  activity   Assist     Assist level: Minimal Assistance - Patient > 75% Assistive device: Hand held assist   Wheelchair     Assist Will patient use wheelchair at discharge?: No             Wheelchair 50 feet with 2 turns activity    Assist            Wheelchair 150 feet activity     Assist          Blood pressure 129/71, pulse 67, temperature 98.2 F (36.8  C), temperature source Oral, resp. rate 14, SpO2 94 %.   Medical Problem List and Plan: 1.  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  Antithrombotics: -DVT/anticoagulation: Lovenox             -antiplatelet therapy: Aspirin 81 mg daily and Plavix 75 mg daily x3 weeks then Plavix alone 3. Pain Management: Tylenol as needed 4. Mood: Provide emotional support             -antipsychotic agents: N/A 5. Neuropsych: This patient is capable of making decisions on his own behalf. 6. Skin/Wound Care: Routine skin checks 7. Fluids/Electrolytes/Nutrition: Routine in and outs with follow-up chemistries 8.  Hypertension.  Coreg 3.125 mg twice daily Cozaar 50 mg daily..  Monitor with increased mobility Vitals:   04/08/21 1929 04/09/21 0357  BP: 132/75 129/71  Pulse: 71 67  Resp: 18 14  Temp: 98.7 F (37.1 C) 98.2 F (36.8 C)  SpO2: 96% 94%  controlled 4/10 9.  Hyperlipidemia.  Lipitor 10.  History of CAD with CABG.  Continue aspirin and Plavix.  No chest pain or shortness of breath 11.  Type 2 diabetes mellitus.  Hemoglobin A1c 6.0.  Diabetic diet CBG (last 3)  No results for input(s): GLUCAP in the last 72 hours.   12.  Questionable medical compliance.  Provide counseling 13. Dry skin- will order eucerin BID and d/c IV since not using.     LOS: 3 days A FACE TO FACE EVALUATION WAS PERFORMED  [name] [name] 04/09/2021, 6:14 AM

## 2021-04-09 NOTE — Progress Notes (Signed)
Physical Therapy Session Note  Patient Details  Name: [name] [name] MRN: [id number] Date of Birth: 03-02-1941  Today's Date: 04/09/2021 PT Individual Time: 0850-0922 PT Individual Time Calculation (min): 32 min   Short Term Goals: Week 1:  PT Short Term Goal 1 (Week 1): = LTGs  Skilled Therapeutic Interventions/Progress Updates:     Patient in recliner in the room upon PT arrival. Patient alert and agreeable to PT session. Patient denied pain during session.  Therapeutic Activity: Transfers: Patient performed sit to/from stand x6 with supervision. Provided verbal cues for safety awareness, as patient tends to stand spontaneously without therapist near by.  Gait Training:  Patient ambulated >150 feet x3 without AD with CGA-close supervision. Ambulated with increased toe out, intermittent crossing over and decreased foot clearance R>L, forward trunk flexion, and downward head gaze. Provided verbal cues for erect posture, looking ahead for improved anticipatory reactions when ambulating, increased BOS on R, heel strike at initial contact for improved B foot clearance, and increased arm swing and gait speed for improved balance.  Neuromuscular Re-ed: Patient performed the following dynamic gait activities: -waving between 5 cones 1.5 ft apart down and back x3 -stepping over small orange cones 1.5 ft apart with reciprocal gait down and back x3 -sit to/from stand x5 without upper extremity support or use of legs on mat table  Patient verbose throughout session, requiring redirection to therapy tasks. Patient asking to be independent in the room, PT educated patient on current fall risk and safety concerns at this time, patient stated understanding. Provided strategies for improving the process for needing to call for assistance to mobilize in the room.   Patient in recliner in the room at end of session with breaks locked, bed alarm set, and all needs within reach.    Therapy  Documentation Precautions:  Precautions Precautions: Fall Restrictions Weight Bearing Restrictions: No   Therapy/Group: Individual Therapy  Kyser Wandel L Shaylee Stanislawski PT, DPT  04/09/2021, 11:51 AM

## 2021-04-09 NOTE — Progress Notes (Signed)
Occupational Therapy Session Note  Patient Details  Name: KENNON ENCINAS MRN: [id number] Date of Birth: 16-Mar-1941  Today's Date: 04/09/2021 OT Individual Time: 1050-1130 OT Individual Time Calculation (min): 40 min    Short Term Goals: Week 1:  OT Short Term Goal 1 (Week 1): Short term goals equal to LTGs set at modified independent level.  Skilled Therapeutic Interventions/Progress Updates:    Pt received sitting in recliner requesting shower. Pt with reduced awareness of deficits/over-confidence requiring cueing/education throughout session but very pleasant and motivated. Pt completed ambulatory transfer around room with CGA, progressing to close supervision. He demonstrated very unsafe method doffing clothing- standing on one leg and holding onto wall despite OT cueing. Pt's wife later reports this is baseline and she also insists he sit. He transfers into shower and uses TTB for all bathing. Shower completed at set up assist level. Pt donned shirt with mod I, pants with supervision. He was able to reach down and don socks seated. Grooming tasks completed in standing at the sink with supervision. Pt was left sitting up in the recliner with his wife present. Chair alarm set.   Therapy Documentation Precautions:  Precautions Precautions: Fall Restrictions Weight Bearing Restrictions: No  Therapy/Group: Individual Therapy  Curtis Sites 04/09/2021, 7:29 AM

## 2021-04-09 NOTE — IPOC Note (Signed)
Overall Plan of Care [facility]) Patient Details Name: [name] [name] MRN: [id number] DOB: October 08, 1941  Admitting Diagnosis: Right thalamic infarction [facility])  Hospital Problems: Principal Problem:   Right thalamic infarction [facility]) Active Problems:   Essential hypertension   Type 2 diabetes mellitus ([place])   Unsteady gait     Functional Problem List: Nursing Bladder,Endurance,Medication Management,Safety,Sensory  PT Balance,Endurance,Motor,Safety,Sensory  OT Balance,Motor  SLP    TR         Basic ADL's: OT Grooming,Bathing,Dressing,Toileting     Advanced  ADL's: OT Simple Meal Preparation,Light Housekeeping     Transfers: PT Bed Mobility,Bed to [employer]  OT Toilet,Tub/Shower     Locomotion: PT Ambulation,Stairs     Additional Impairments: OT Fuctional Use of Upper Extremity  SLP        TR      Anticipated Outcomes Item Anticipated Outcome  Self Feeding independent  Swallowing      Basic self-care  modified independent  Toileting  modified independent   Bathroom Transfers modified independent  Bowel/Bladder  Manage bladder with mod I assist  Transfers  mod I transfers and supervision car transfer  Locomotion  mod I household distances; supervision longer distances and stairs  Communication     Cognition     Pain     Safety/Judgment  Maintain safety with cues/reminders   Therapy Plan: PT Intensity: Minimum of 1-2 x/day ,45 to 90 minutes PT Frequency: 5 out of 7 days PT Duration Estimated Length of Stay: 7-10 days OT Intensity: Minimum of 1-2 x/day, 45 to 90 minutes OT Frequency: 5 out of 7 days OT Duration/Estimated Length of Stay: 5-7 days     Due to the current state of emergency, patients may not be receiving their 3-hours of Medicare-mandated therapy.   Team Interventions: Nursing Interventions Patient/Family Education,Discharge Planning,Medication Management,Disease Management/Prevention,Bladder Management  PT interventions  Ambulation/gait training,Balance/vestibular training,Cognitive remediation/compensation,Community reintegration,Discharge planning,Disease management/prevention,DME/adaptive equipment instruction,Functional mobility training,Neuromuscular re-education,Pain management,Patient/family education,Psychosocial support,Splinting/orthotics,Skin care/wound management,Stair training,Therapeutic Activities,Therapeutic Exercise,UE/LE Strength taining/ROM,UE/LE Museum/gallery conservator propulsion/positioning  OT Interventions Balance/vestibular training,Discharge planning,Self Care/advanced ADL retraining,Therapeutic Activities,UE/LE Coordination activities,Functional mobility training,Patient/family education,Therapeutic Exercise,Disease mangement/prevention,Community reintegration,DME/adaptive equipment instruction,Neuromuscular re-education,UE/LE Strength taining/ROM  SLP Interventions    TR Interventions    SW/CM Interventions Discharge Planning,Psychosocial Support,Patient/Family Education   Barriers to Discharge MD  Medical stability  Nursing Decreased caregiver support,New diabetic,Home environment access/layout 1 level, 3 step entry; new diabetic  PT      OT      SLP      SW Decreased caregiver support,Lack of/limited family support     Team Discharge Planning: Destination: PT-Home ,OT- Home , SLP-Home Projected Follow-up: PT-Outpatient PT, OT-  None, SLP-None Projected Equipment Needs: PT-To be determined (pt has access to [facility] and [facility] at home), OT- To be determined, SLP-None recommended by SLP Equipment Details: PT- , OT-  Patient/family involved in discharge planning: PT- Patient,  OT-Patient, SLP-Patient,Family member/caregiver  MD ELOS: 10-14d Medical Rehab Prognosis:  Good Assessment:  80 year old right-handed male with history of TIA, hypertension, hyperlipidemia, CAD with CABG 2005 maintained on aspirin, right CEA and questionable medical compliance.  Per chart review patient  lives with spouse.  Independent prior to admission using an assistive device.  1 level home 3 steps to entry.  Presented 04/03/2021 with acute onset of left-sided weakness.  Cranial CT scan negative for acute changes.  CT angiogram of head and neck showed no emergent large vessel occlusion.  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  MRI of the brain showed small acute right thalamic infarction.  Echocardiogram with ejection fraction of 60 to 65% no wall motion abnormalities.  Admission chemistries unremarkable except glucose 190 creatinine 1.25 alcohol negative urine drug screen negative.  Currently maintained on aspirin Plavix for CVA prophylaxis x3 weeks then Plavix alone.  Subcutaneous Lovenox for DVT prophylaxis.  Tolerating a regular diet.  Therapy evaluations completed due to patient's decreased functional ability left side weakness recommendations of physical medicine rehab consult patient was admitted for a comprehensive rehab program.   Now requiring 24/7 Rehab RN,MD, as well as CIR level PT, OT and SLP.  Treatment team will focus on ADLs and mobility with goals set at Mod I    See Team Conference Notes for weekly updates to the plan of care

## 2021-04-10 ENCOUNTER — Other Ambulatory Visit: Payer: Self-pay

## 2021-04-10 ENCOUNTER — Encounter (HOSPITAL_COMMUNITY): Payer: Self-pay | Admitting: Physical Medicine & Rehabilitation

## 2021-04-10 DIAGNOSIS — Z9889 Other specified postprocedural states: Secondary | ICD-10-CM

## 2021-04-10 DIAGNOSIS — E119 Type 2 diabetes mellitus without complications: Secondary | ICD-10-CM

## 2021-04-10 DIAGNOSIS — G459 Transient cerebral ischemic attack, unspecified: Secondary | ICD-10-CM

## 2021-04-10 DIAGNOSIS — I6521 Occlusion and stenosis of right carotid artery: Secondary | ICD-10-CM

## 2021-04-10 DIAGNOSIS — I10 Essential (primary) hypertension: Secondary | ICD-10-CM | POA: Diagnosis not present

## 2021-04-10 DIAGNOSIS — I639 Cerebral infarction, unspecified: Secondary | ICD-10-CM | POA: Diagnosis not present

## 2021-04-10 NOTE — Progress Notes (Signed)
Physical Therapy Session Note  Patient Details  Name: [name] [name] MRN: [id number] Date of Birth: [birth year]  Today's Date: 04/10/2021 PT Individual Time: 0800-0900 PT Individual Time Calculation (min): 60 min   Short Term Goals: Week 1:  PT Short Term Goal 1 (Week 1): = LTGs  Skilled Therapeutic Interventions/Progress Updates:    Patient received sitting up in recliner, RN present administering AM rx, agreeable to PT. He denies pain. Patient able to come to standing with no AD and light CGA. Ambulating to gym with no AD and CGA. No LOB, but trendelenburg gait noted due to B hip stabilizer weakness, R>L. Patient completing standing marches with 4# ankle weights B, 4 x30s, no UE + CGA. Patient completing standing chest press + shoulder press with 5# dowel and close supervision- no LOB. In parallel bars, patient completing standing hip abduction 4# ankle weights, B UE on bars, 3x12. [place] stepping with 4# ankle weights x3 laps, B UE support, good coordination noted. He was able to ambulate 130ft with 4# ankle weights B for improved proprioceptive input and ambulated additional 37ft with no ankle weights with good carryover on foot clearance. Patient completing 8 mins on NuStep with B LE only for improved coordination. Ambulating back to his room with no AD and CGA. Returning to recliner, chair alarm on, call light within reach.    Therapy Documentation Precautions:  Precautions Precautions: Fall Restrictions Weight Bearing Restrictions: No    Therapy/Group: Individual Therapy  Karoline Caldwell, PT, DPT, CBIS  04/10/2021, 7:45 AM

## 2021-04-10 NOTE — Progress Notes (Signed)
PROGRESS NOTE   Subjective/Complaints:  Pt reports doing well- no issues/complaints- likely going home this week, per therapies/pt.  No pain; bowels working.   ROS-  Pt denies SOB, abd pain, CP, N/V/C/D, and vision changes   Objective:   No results found. No results for input(s): WBC, HGB, HCT, PLT in the last 72 hours. No results for input(s): NA, K, CL, CO2, GLUCOSE, BUN, CREATININE, CALCIUM in the last 72 hours.  Intake/Output Summary (Last 24 hours) at 04/10/2021 0846 Last data filed at 04/10/2021 0816 Gross per 24 hour  Intake 960 ml  Output --  Net 960 ml        Physical Exam: Vital Signs Blood pressure 135/67, pulse 62, temperature 98.1 F (36.7 C), temperature source Oral, resp. rate 20, SpO2 92 %.   General: awake, alert, appropriate, laying in bed; appears comfortable, NAD HENT: conjugate gaze; oropharynx moist CV: regular rate; no JVD Pulmonary: CTA B/L; no W/R/R- good air movement GI: soft, NT, ND, (+)BS Psychiatric: appropriate; bright Neurological: Ox3  Neurologic: Cranial nerves II through XII intact, motor strength is 5/5 in R and 4/5 L deltoid, bicep, tricep, grip, hip flexor, knee extensors, ankle dorsiflexor and plantar flexor Sensory exam normal sensation to light touch and proprioception in bilateral upper and lower extremities Cerebellar exam normal finger to nose to finger Musculoskeletal: Full range of motion in all 4 extremities. No joint swelling   Assessment/Plan: 1. Functional deficits which require 3+ hours per day of interdisciplinary therapy in a comprehensive inpatient rehab setting.  Physiatrist is providing close team supervision and 24 hour management of active medical problems listed below.  Physiatrist and rehab team continue to assess barriers to discharge/monitor patient progress toward functional and medical goals  Care Tool:  Bathing    Body parts bathed by  patient: Right arm,Left arm,Chest,Abdomen,Front perineal area,Buttocks,Right upper leg,Left upper leg,Face,Left lower leg,Right lower leg         Bathing assist Assist Level: Contact Guard/Touching assist     Upper Body Dressing/Undressing Upper body dressing   What is the patient wearing?: Hospital gown only    Upper body assist Assist Level: Set up assist    Lower Body Dressing/Undressing Lower body dressing      What is the patient wearing?: Pants     Lower body assist Assist for lower body dressing: Independent     Toileting Toileting    Toileting assist Assist for toileting: Independent     Transfers Chair/bed transfer  Transfers assist     Chair/bed transfer assist level: Contact Guard/Touching assist     Locomotion Ambulation   Ambulation assist      Assist level: Contact Guard/Touching assist Assistive device: No Device Max distance: 142ft   Walk 10 feet activity   Assist     Assist level: Contact Guard/Touching assist Assistive device: No Device   Walk 50 feet activity   Assist    Assist level: Contact Guard/Touching assist Assistive device: No Device    Walk 150 feet activity   Assist Walk 150 feet activity did not occur: Safety/medical concerns  Assist level: Minimal Assistance - Patient > 75% Assistive device: No Device    Walk  10 feet on uneven surface  activity   Assist     Assist level: Minimal Assistance - Patient > 75% Assistive device: Hand held assist   Wheelchair     Assist Will patient use wheelchair at discharge?: No             Wheelchair 50 feet with 2 turns activity    Assist            Wheelchair 150 feet activity     Assist          Blood pressure 135/67, pulse 62, temperature 98.1 F (36.7 C), temperature source Oral, resp. rate 20, SpO2 92 %.   Medical Problem List and Plan: 1.  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 2.  Antithrombotics: -DVT/anticoagulation: Lovenox             -antiplatelet therapy: Aspirin 81 mg daily and Plavix 75 mg daily x3 weeks then Plavix alone 3. Pain Management: Tylenol as needed 4. Mood: Provide emotional support             -antipsychotic agents: N/A 5. Neuropsych: This patient is capable of making decisions on his own behalf. 6. Skin/Wound Care: Routine skin checks 7. Fluids/Electrolytes/Nutrition: Routine in and outs with follow-up chemistries 8.  Hypertension.  Coreg 3.125 mg twice daily Cozaar 50 mg daily..  Monitor with increased mobility Vitals:   04/09/21 1952 04/10/21 0411  BP: (!) 144/66 135/67  Pulse: 69 62  Resp: 18 20  Temp: 97.8 F (36.6 C) 98.1 F (36.7 C)  SpO2: 94% 92%   4/11- BP overall controlled- con't regimen 9.  Hyperlipidemia.  Lipitor 10.  History of CAD with CABG.  Continue aspirin and Plavix.  No chest pain or shortness of breath 11.  Type 2 diabetes mellitus.  Hemoglobin A1c 6.0.  Diabetic diet CBG (last 3)  No results for input(s): GLUCAP in the last 72 hours.   12.  Questionable medical compliance.  Provide counseling 13. Dry skin- will order eucerin BID and d/c IV since not using.     LOS: 4 days A FACE TO FACE EVALUATION WAS PERFORMED  Jhordan Kinter 04/10/2021, 8:46 AM

## 2021-04-10 NOTE — Progress Notes (Signed)
Physical Therapy Session Note  Patient Details  Name: [name] [name] MRN: [id number] Date of Birth: [birth year]  Today's Date: 04/10/2021 PT Individual Time: 1000-1056 PT Individual Time Calculation (min): 56 min   Short Term Goals: Week 1:  PT Short Term Goal 1 (Week 1): = LTGs  Skilled Therapeutic Interventions/Progress Updates:     Pt greeted seated in recliner, awake and agreeable to therapy - no reports of pain but he does spend some time discussing his eagerness to return home. He reports he's spoken to multiple therapists about this and this was relayed to the primary PT as well. Sit<>stand with CGA and no AD from recliner height - ambulated ~190ft to ortho gym with minA and no AD - gait deficits include R toe out, decreased R foot clearance, and short bilateral step length.   Completed NMR with BITS system in standing on level ground with feet apart and CGA for balance - completed Geoboard task with 10 second memory component. Pt initially had difficult with this but after a few repetitions, pt was able to complete with ~90% accuracy. Noted decreased memory strategy while completing this and would rather try to copy geoboard image by looking at it.   Completed NMR on blue airex foam pad with minA guard for balance: -1x15 shldr press with 5# dowel rod -1x15 chest press with 5# dowel rod -1x15 chin-ups with 5# dowel rod -1x15 [place] dead-lift with 5# dowel rod  Completed NMR on blue airex foam pad with minA guard for balance: -placed resistive clothes pins on his clothes including pants and shirt, instructed pt to locate and and doff the clothes pins - involving thoracic rotations/flexions and well as reaching down to his shoes in standing. Noted posterior bias in standing while completing this task with difficulty self correcting.   Completed Nustep x8 minutes with BLE only at workload 6, focusing on hip/knee extension and reciprocal movement patterns, as well as general  cardiovascular endurance training. Pt conversant throughout, maintaining ~40-45 steps/minute cadence.   Gait training 164ft + 161ft with minA and no AD - therapist placing foot in front of his R foot randomly to work on righting reactions, stepping over obstacles, and improving R foot clearance during swing - pt without formal LOB but increased unsteadiness noted and sometimes needing to stop ambulating to step over foot. May benefit from DGI or FGA outcome measure..  Pt ended session seated in recliner with needs in reach, chair alarm on.  Therapy Documentation Precautions:  Precautions Precautions: Fall Restrictions Weight Bearing Restrictions: No General:    Therapy/Group: Individual Therapy  Joette Schmoker P Tyrann Donaho PT 04/10/2021, 7:41 AM

## 2021-04-10 NOTE — Progress Notes (Signed)
Occupational Therapy Session Note  Patient Details  Name: [name] [name] MRN: [id number] Date of Birth: 1941-09-05  Today's Date: 04/10/2021 OT Individual Time: 1302-1404 OT Individual Time Calculation (min): 62 min    Short Term Goals: Week 1:  OT Short Term Goal 1 (Week 1): Short term goals equal to LTGs set at modified independent level.  Skilled Therapeutic Interventions/Progress Updates:    Pt in recliner to start session with spouse present.  He was able to ambulate down to the tub room with supervision without an assistive device.  No LOB noted.  Had him work on stepping over into the tub without use of the grab bars as well as completing sit to stand from the elevated toilet.  He was able to complete both tasks at supervision level.  Therapist still recommends use of a shower seat for safety while bathing.  Had him transition to the ortho gym with supervision.  Worked on standing balance with use of the foam pad.  Had him complete functional reaching, head turns, and ball toss from the mat.  No LOB noted while standing on the foam surface, but he did lose his balance when stepping off backwards on one occasion.  Also had him complete visual pursuits task on the BITS with rotary component while standing.  He averaged 94-100% accuracy with avg reaction time at 2.35 seconds when numbers disappeared off of the screen and 5 seconds when they stayed persistent.  He used the LUE throughout each interval.  Also had him complete Nine Hole Peg Test.  He was able to complete in 30 seconds with the RUE and 35 seconds with the left.  Returned to the room without assistive device at supervision level.  He was left with the call button and phone in reach and safety alarm pad in place.  Spouse also present in room as well.    Therapy Documentation Precautions:  Precautions Precautions: Fall Restrictions Weight Bearing Restrictions: No  Pain: Pain Assessment Pain Scale: 0-10 Pain Score: 0-No  pain ADL: See Care Tool Section for some details of mobility and selfcare  Therapy/Group: Individual Therapy  Jesselle Laflamme OTR/L 04/10/2021, 3:39 PM

## 2021-04-10 NOTE — Progress Notes (Signed)
Placed order for referral to Dr. Donnetta Hutching, who patient has seen in past.

## 2021-04-11 NOTE — Progress Notes (Signed)
Occupational Therapy Session Note  Patient Details  Name: KHARSON RASMUSSON MRN: [id number] Date of Birth: 01/07/1941  Today's Date: 04/11/2021 OT Individual Time: 0804-0900 OT Individual Time Calculation (min): 56 min    Short Term Goals: Week 1:  OT Short Term Goal 1 (Week 1): Short term goals equal to LTGs set at modified independent level.  Skilled Therapeutic Interventions/Progress Updates:    Pt worked on bathing and dressing during session.  He was able to ambulate around the room without an assistive device independently to gather clothing and complete shower sit to stand with modified independent level with use of the shower bench.  He was then able to complete dressing sit to stand at modified independent level as well.  He stood at the sink and donned his deodorant and his shirt as well as combing his hair.  Next, had him complete functional mobility up to the dayroom with independence and no device at a slightly slower rate secondary to pt having to divide his attention between balance and conversation.  He was able to weigh himself and then complete a short 2-3 min rest break at the elevators before finishing mobility back to the room.  He was left with the call button and phone in reach with safety alarm in place sitting in the bedside chair.    Therapy Documentation Precautions:  Precautions Precautions: Fall Restrictions Weight Bearing Restrictions: No  Pain: Pain Assessment Pain Scale: 0-10 Pain Score: 0-No pain ADL: See Care Tool Section for some details of mobility and selfcare  Therapy/Group: Individual Therapy  Hannia Matchett OTR/L 04/11/2021, 12:13 PM

## 2021-04-11 NOTE — Progress Notes (Signed)
Occupational Therapy Discharge Summary  Patient Details  Name: [name] [name] MRN: [id number] Date of Birth: November 30, 1941  Today's Date: 04/11/2021 OT Individual Time: 1829-9371 OT Individual Time Calculation (min): 40 min   Session Note:  Pt completed functional mobility from his room down to the dayroom with independence for work on hip and ankle strategies with use of the Wii.  He completed several activities with use of the balance board.  He was able to step on and off of the board with independence.  Min demonstrational cueing to avoid trunk flexion when attempting to increase forward weightshift.  Returned to the room at the end of the session with pt being made independent in his room.  His wife was present and therapist gave instructions on need for use of a shower seat at home as well as sitting down to complete LB dressing tasks.  Also encouraged him to have initial supervision for the first shower, just to be sure.  She voiced understanding.  Pt left sitting EOB with call button in reach.    Patient has met 12 of 12 long term goals due to improved balance, functional use of  LEFT upper extremity and improved coordination.  Patient to discharge at overall Modified Independent level.  Patient's care partner is independent to provide the necessary physical assistance at discharge.    Reasons goals not met: NA  Recommendation:  Feel pt does not warrant any further OT needs at this time.    Equipment: No equipment provided  Reasons for discharge: treatment goals met and discharge from hospital  Patient/family agrees with progress made and goals achieved: Yes  OT Discharge Precautions/Restrictions  Precautions Precautions: Fall Restrictions Weight Bearing Restrictions: No   Pain Pain Assessment Pain Scale: Faces Pain Score: 0-No pain ADL ADL Eating: Independent Where Assessed-Eating: Chair Grooming: Independent Where Assessed-Grooming: Standing at sink Upper Body  Bathing: Modified independent Where Assessed-Upper Body Bathing: Chair,Shower Lower Body Bathing: Modified independent Where Assessed-Lower Body Bathing: Chair,Shower Upper Body Dressing: Independent Where Assessed-Upper Body Dressing: Wheelchair Lower Body Dressing: Modified independent Where Assessed-Lower Body Dressing: Sitting at sink,Standing at sink,Chair Toileting: Modified independent Where Assessed-Toileting: Risk analyst Method: Counselling psychologist: Raised toilet seat,Grab bars Tub/Shower Transfer: Modified independent Clinical cytogeneticist Method: Optometrist: Civil engineer, contracting with back Social research officer, government: Modified independent Social research officer, government Method: Heritage manager: Advertising account executive Baseline Vision/History: Cataracts Patient Visual Report: No change from baseline Vision Assessment?: No apparent visual deficits Perception  Perception: Within Functional Limits Praxis Praxis: Intact Cognition Overall Cognitive Status: Within Functional Limits for tasks assessed Arousal/Alertness: Awake/alert Attention: Sustained;Selective Sustained Attention: Appears intact Selective Attention: Appears intact Memory: Appears intact Safety/Judgment: Appears intact Sensation Sensation Light Touch: Appears Intact Hot/Cold: Appears Intact Proprioception: Appears Intact Stereognosis: Appears Intact Additional Comments: Pt reports some numbness in his fingertips of the LUE. Coordination Gross Motor Movements are Fluid and Coordinated: Yes Fine Motor Movements are Fluid and Coordinated: Yes 9 Hole Peg Test: 30 seconds in the left hand and 35 in the right Motor  Motor Motor: Abnormal postural alignment and control Motor - Discharge Observations: kyphotic posture, generalized weakness Mobility  Bed Mobility Bed Mobility: Supine to Sit;Sit to  Supine Supine to Sit: Independent Sit to Supine: Independent Transfers Sit to Stand: Independent Stand to Sit: Independent  Trunk/Postural Assessment  Cervical Assessment Cervical Assessment: Exceptions to [facility] (cervical flexion and protraction) Thoracic Assessment Thoracic Assessment: Exceptions to [facility] (thoracic rounding) Lumbar Assessment  Lumbar Assessment: Exceptions to [facility] (posterior pelvic tilt)  Balance Balance Balance Assessed: Yes Static Sitting Balance Static Sitting - Balance Support: Feet supported Static Sitting - Level of Assistance: 7: Independent Dynamic Sitting Balance Dynamic Sitting - Balance Support: During functional activity Dynamic Sitting - Level of Assistance: 7: Independent Static Standing Balance Static Standing - Balance Support: During functional activity;No upper extremity supported Static Standing - Level of Assistance: 7: Independent Extremity/Trunk Assessment RUE Assessment RUE Assessment: Within Functional Limits LUE Assessment LUE Assessment: Exceptions to [facility] Strength Comments: 4/5 throughout slight decreased coordination speed with finger to nose testing compared to the RUE as well as slightly slower Nine Hole Peg Test, but uses it at a dominant level for selfcare tasks at a normal level.   Denicia Pagliarulo OTR/L 04/11/2021, 5:04 PM

## 2021-04-11 NOTE — Progress Notes (Addendum)
PROGRESS NOTE   Subjective/Complaints:  Per PT, OT ready for d/c in am , discussed need for [facility] appt , Left ICA stenosis  ROS-  Pt denies SOB, abd pain, CP, N/V/C/D, and vision changes   Objective:   No results found. No results for input(s): WBC, HGB, HCT, PLT in the last 72 hours. No results for input(s): NA, K, CL, CO2, GLUCOSE, BUN, CREATININE, CALCIUM in the last 72 hours.  Intake/Output Summary (Last 24 hours) at 04/11/2021 0727 Last data filed at 04/10/2021 1840 Gross per 24 hour  Intake 720 ml  Output --  Net 720 ml        Physical Exam: Vital Signs Blood pressure (!) 141/63, pulse (!) 58, temperature 97.6 F (36.4 C), resp. rate 17, SpO2 90 %.   General: awake, alert, appropriate, laying in bed; appears comfortable, NAD HENT: conjugate gaze; oropharynx moist CV: regular rate; no JVD Pulmonary: CTA B/L; no W/R/R- good air movement GI: soft, NT, ND, (+)BS Psychiatric: appropriate; bright Neurological: Ox3  Neurologic: Cranial nerves II through XII intact, motor strength is 5/5 in R and 4/5 L deltoid, bicep, tricep, grip, hip flexor, knee extensors, ankle dorsiflexor and plantar flexor Sensory exam normal sensation to light touch and proprioception in bilateral upper and lower extremities Cerebellar exam normal finger to nose to finger Musculoskeletal: Full range of motion in all 4 extremities. No joint swelling   Assessment/Plan: 1. Functional deficits which require 3+ hours per day of interdisciplinary therapy in a comprehensive inpatient rehab setting.  Physiatrist is providing close team supervision and 24 hour management of active medical problems listed below.  Physiatrist and rehab team continue to assess barriers to discharge/monitor patient progress toward functional and medical goals  Care Tool:  Bathing    Body parts bathed by patient: Right arm,Left arm,Chest,Abdomen,Front perineal  area,Buttocks,Right upper leg,Left upper leg,Face,Left lower leg,Right lower leg         Bathing assist Assist Level: Contact Guard/Touching assist     Upper Body Dressing/Undressing Upper body dressing   What is the patient wearing?: Hospital gown only    Upper body assist Assist Level: Set up assist    Lower Body Dressing/Undressing Lower body dressing      What is the patient wearing?: Pants     Lower body assist Assist for lower body dressing: Independent     Toileting Toileting    Toileting assist Assist for toileting: Independent     Transfers Chair/bed transfer  Transfers assist     Chair/bed transfer assist level: Contact Guard/Touching assist     Locomotion Ambulation   Ambulation assist      Assist level: Supervision/Verbal cueing Assistive device: No Device Max distance: 75'   Walk 10 feet activity   Assist     Assist level: Contact Guard/Touching assist Assistive device: No Device   Walk 50 feet activity   Assist    Assist level: Contact Guard/Touching assist Assistive device: No Device    Walk 150 feet activity   Assist Walk 150 feet activity did not occur: Safety/medical concerns  Assist level: Contact Guard/Touching assist Assistive device: No Device    Walk 10 feet on uneven surface  activity   Assist     Assist level: Minimal Assistance - Patient > 75% Assistive device: Hand held assist   Wheelchair     Assist Will patient use wheelchair at discharge?: No             Wheelchair 50 feet with 2 turns activity    Assist            Wheelchair 150 feet activity     Assist          Blood pressure (!) 141/63, pulse (!) 58, temperature 97.6 F (36.4 C), resp. rate 17, SpO2 90 %.   Medical Problem List and Plan: 1.  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  Antithrombotics: -DVT/anticoagulation: Lovenox             -antiplatelet therapy: Aspirin 81 mg daily and Plavix 75 mg daily x3 weeks then Plavix alone Hx R CEA, has Left ICA stenosis f/u VVS- Dr Donnetta Hutching 3. Pain Management: Tylenol as needed 4. Mood: Provide emotional support             -antipsychotic agents: N/A 5. Neuropsych: This patient is capable of making decisions on his own behalf. 6. Skin/Wound Care: Routine skin checks 7. Fluids/Electrolytes/Nutrition: Routine in and outs with follow-up chemistries 8.  Hypertension.  Coreg 3.125 mg twice daily Cozaar 50 mg daily..  Monitor with increased mobility Vitals:   04/10/21 1936 04/11/21 0405  BP: (!) 146/69 (!) 141/63  Pulse: 63 (!) 58  Resp: 16 17  Temp: 98 F (36.7 C) 97.6 F (36.4 C)  SpO2: 91% 90%   4/12 fair/good control 9.  Hyperlipidemia.  Lipitor 10.  History of CAD with CABG.  Continue aspirin and Plavix.  No chest pain or shortness of breath 11.  Type 2 diabetes mellitus.  Hemoglobin A1c 6.0.  Diabetic diet   12.  Questionable medical compliance.  Provide counseling 13. Dry skin- will order eucerin BID and d/c IV since not using.     LOS: 5 days A FACE TO FACE EVALUATION WAS PERFORMED  Charlett Blake 04/11/2021, 7:27 AM

## 2021-04-11 NOTE — Progress Notes (Signed)
Physical Therapy Discharge Summary  Patient Details  Name: [name] [name] MRN: [id number] Date of Birth: 08/18/1941  Today's Date: 04/11/2021 PT Individual Time: 1009-1108 PT Individual Time Calculation (min): 59 min    Patient has met 8 of 8 long term goals due to improved activity tolerance, improved balance, improved postural control, increased strength and improved coordination.  Patient to discharge at an ambulatory level Independent.   Patient's care partner is independent to provide the necessary physical assistance at discharge.  Reasons goals not met: n/a  Recommendation:  Patient will benefit from ongoing skilled PT services in outpatient setting to continue to advance safe functional mobility, address ongoing impairments in balance, gait, and minimize fall risk.  Equipment: RW  Reasons for discharge: treatment goals met  Patient/family agrees with progress made and goals achieved: Yes    Skilled Therapeutic Interventions/Progress Updates: Pt received sitting in recliner. Did not report any pain and agreeable to therapy. He is excited to go home and be with his wife. Gait training to therapy gym ~289f independent no AD. Pt demonstrates R toe out throughout with increased kyphotic posture. Berg Balance test = 52/56, demonstrating significant balance progress from initial eval, and putting pt at low fall risk category. 5x STS = 12sec, further putting pt in low falls risk category. Stair training independent; able to ambulate 12 steps without HR with step though pattern. However, pt verbalized that he will have R HR for home entry if needed. Independent ambulation to bathroom per pt request; pt continent of urine. Independent ambulation to therapy gym. Car transfer practiced at height that resembled pt's SUV; able to perform independently. Pt asked if he will be able to drive; therapist educated that will be physician's decision based on stroke etiology and pt specific case. Ramp  ambulation performed on ramp~149fwith supervision no AD. Ambulation back to room independent with no AD. Bed mobility assessed; pt independent. Pt verbalized that he and his wife would still feel more comfortable having a RW at home for extra safety, and therapist educated this may also be beneficial for long distance community ambulation. Pt asked if he can also use his cane at home; therapist educated preferred side should be LUE as pt demonstrates decreased R foot clearance. Pt educated on outpatient PT follow up after discharge. Pt left sitting in recliner with seat alarm turned on and needs within reach.  PT Discharge Precautions/Restrictions Precautions Precautions: Fall Restrictions Weight Bearing Restrictions: No Pain Pain Assessment Pain Scale: 0-10 Pain Score: 0-No pain Vision/Perception  Perception Perception: Within Functional Limits Praxis Praxis: Intact  Cognition Overall Cognitive Status: Within Functional Limits for tasks assessed Arousal/Alertness: Awake/alert Orientation Level: Oriented X4 Attention: Sustained Sustained Attention: Appears intact Memory: Appears intact Awareness: Appears intact Problem Solving: Appears intact Safety/Judgment: Appears intact Sensation Sensation Light Touch: Appears Intact Coordination Gross Motor Movements are Fluid and Coordinated: Yes Fine Motor Movements are Fluid and Coordinated: Yes Motor  Motor Motor: Abnormal postural alignment and control Motor - Discharge Observations: Kyphotic posture may impair balance during gait  Mobility Bed Mobility Rolling Right: Independent Supine to Sit: Independent Sit to Supine: Independent Transfers Sit to Stand: Independent Stand to Sit: Independent Stand Pivot Transfers: Independent Transfer (Assistive device): None Locomotion  Gait Ambulation: Yes Gait Assistance: Independent Gait Distance (Feet): 200 Feet Assistive device: None Gait Gait: Yes Gait Pattern: Impaired Gait  Pattern: Trunk flexed;Poor foot clearance - right (RLE ER throughout) Stairs / Additional Locomotion Stairs: Yes Stairs Assistance: Independent Number of Stairs: 12 Height  of Stairs: 6 Ramp: Sales executive Mobility: No  Trunk/Postural Assessment  Cervical Assessment Cervical Assessment: Exceptions to [facility] (Slight fwd head) Thoracic Assessment Thoracic Assessment: Exceptions to [facility] (Kyphotic) Lumbar Assessment Lumbar Assessment: Exceptions to [facility] (Posterior pelvic tilt)  Balance Balance Balance Assessed: Yes Standardized Balance Assessment Standardized Balance Assessment: Berg Balance Test Berg Balance Test Sit to Stand: Able to stand without using hands and stabilize independently Standing Unsupported: Able to stand safely 2 minutes Sitting with Back Unsupported but Feet Supported on Floor or Stool: Able to sit safely and securely 2 minutes Stand to Sit: Sits safely with minimal use of hands Transfers: Able to transfer safely, minor use of hands Standing Unsupported with Eyes Closed: Able to stand 10 seconds safely Standing Ubsupported with Feet Together: Able to place feet together independently and stand 1 minute safely From Standing, Reach Forward with Outstretched Arm: Can reach forward >12 cm safely (5") From Standing Position, Pick up Object from Floor: Able to pick up shoe safely and easily From Standing Position, Turn to Look Behind Over each Shoulder: Looks behind one side only/other side shows less weight shift Turn 360 Degrees: Able to turn 360 degrees safely in 4 seconds or less Standing Unsupported, Alternately Place Feet on Step/Stool: Able to stand independently and safely and complete 8 steps in 20 seconds Standing Unsupported, One Foot in Front: Able to place foot tandem independently and hold 30 seconds Standing on One Leg: Able to lift leg independently and hold equal to or more than 3 seconds Total Score: 52 Static Sitting  Balance Static Sitting - Balance Support: Feet supported Static Sitting - Level of Assistance: 7: Independent Dynamic Sitting Balance Dynamic Sitting - Balance Support: During functional activity Dynamic Sitting - Level of Assistance: 7: Independent Static Standing Balance Static Standing - Balance Support: During functional activity;No upper extremity supported Static Standing - Level of Assistance: 7: Independent Dynamic Standing Balance Dynamic Standing - Balance Support: During functional activity;No upper extremity supported Dynamic Standing - Level of Assistance: 7: Independent Extremity Assessment      RLE Assessment RLE Assessment: Within Functional Limits General Strength Comments: Grossly 5/5 LLE Assessment LLE Assessment: Exceptions to [facility] General Strength Comments: Grossly 5/5; ankle DF 4/5    Keondria Siever, SPT 04/11/2021, 12:18 PM

## 2021-04-11 NOTE — Progress Notes (Signed)
Patient ID: [name] [name], male   DOB: 1941/05/13, 80 y.o.   MRN: [id number]   Rolling Walker Ordered through [employer]. OP referral faxed.   [place], [place]

## 2021-04-12 MED ORDER — CLOPIDOGREL BISULFATE 75 MG PO TABS: 75.0000 mg | ORAL_TABLET | Freq: Every day | ORAL | 3 refills | Status: DC

## 2021-04-12 MED ORDER — ATORVASTATIN CALCIUM 40 MG PO TABS: 40.0000 mg | ORAL_TABLET | Freq: Every day | ORAL | 0 refills | Status: DC

## 2021-04-12 MED ORDER — CARVEDILOL 3.125 MG PO TABS: 3.1250 mg | ORAL_TABLET | Freq: Two times a day (BID) | ORAL | 0 refills | Status: DC

## 2021-04-12 MED ORDER — VITAMIN D 50 MCG (2000 UT) PO CAPS: 2000.0000 [IU] | ORAL_CAPSULE | Freq: Every day | ORAL | 0 refills | Status: DC

## 2021-04-12 MED ORDER — LOSARTAN POTASSIUM 25 MG PO TABS: 50.0000 mg | ORAL_TABLET | Freq: Every day | ORAL | 1 refills | Status: DC

## 2021-04-12 NOTE — Patient Care Conference (Signed)
Inpatient RehabilitationTeam Conference and Plan of Care Update Date: 04/12/2021   Time: 10:36 AM    Patient Name: [name] [name]      Medical Record Number: [id number]  Date of Birth: [birth year] Sex: Male         Room/Bed: 4M02C/4M02C-01 Payor Info: Payor: HUMANA MEDICARE / Plan: HUMANA MEDICARE CHOICE PPO / Product Type: *No Product type* /    Admit Date/Time:  04/06/2021  1:32 PM  Primary Diagnosis:  Right thalamic infarction [facility])  Hospital Problems: Principal Problem:   Right thalamic infarction [facility]) Active Problems:   Essential hypertension   Type 2 diabetes mellitus ([place])   Unsteady gait    Expected Discharge Date: Expected Discharge Date: 04/12/21  Team Members Present: Physician leading conference: Dr. Alysia Penna Care Coodinator Present: Dorien Chihuahua, RN, BSN, CRRN;Christina Parcelas Pen[place], [place] Nurse Present: Dorien Chihuahua, RN PT Present: Page Spiro, PT OT Present: Clyda Greener, OT PPS Coordinator present : Gunnar Fusi, SLP     Current Status/Progress Goal Weekly Team Focus  Bowel/Bladder   cont B/B. last BM-4/10  remain continent  assess q shift and PRN   Swallow/Nutrition/ Hydration             ADL's   modified independent for all selfcare tasks sit to stand as well as transfers without an assistive device  modified independent overall  selfcare retraining, transfer training, neuromuscular re-education, balance retraining, pt/famiy education   Mobility             Communication             Safety/Cognition/ Behavioral Observations            Pain   no c/o pain  pain<3  assess apin q shift and PRN   Skin   intact  assess q shift and PRN  assess skin q shift and PRN     Discharge Planning:  pt d/c home on 4/13 with spouse   Team Discussion: On target for discharge  Patient on target to meet rehab goals: yes  *See Care Plan and progress notes for long and short-term goals.   Revisions to Treatment Plan:   Teaching  Needs: Transfers, toileting, medications, secondary stroke risk management, etc.  Current Barriers to Discharge: Decreased caregiver support and Home enviroment access/layout  Possible Resolutions to Barriers: Family education    Medical Summary Current Status: medically stable, BP with some lability     Possible Resolutions to [employer] Focus: discharge home today with referral to OP PT, OT   Continued Need for Acute Rehabilitation Level of Care: The patient requires daily medical management by a physician with specialized training in physical medicine and rehabilitation for the following reasons: Direction of a multidisciplinary physical rehabilitation program to maximize functional independence : Yes Medical management of patient stability for increased activity during participation in an intensive rehabilitation regime.: Yes Analysis of laboratory values and/or radiology reports with any subsequent need for medication adjustment and/or medical intervention. : Yes   I attest that I was present, lead the team conference, and concur with the assessment and plan of the team.   Dorien Chihuahua B 04/12/2021, 1:59 PM

## 2021-04-12 NOTE — Progress Notes (Signed)
Inpatient Rehabilitation Care Coordinator Discharge Note  The overall goal for the admission was met for:   Discharge location: Yes, home  Length of Stay: Yes, 6 days  Discharge activity level: Yes, supervision   Home/community participation: Yes  Services provided included: MD, RD, PT, OT, SLP, RN, CM, TR, Pharmacy, [place]: Private Insurance: [employer]  Choices offered to/list presented to:pt  Follow-up services arranged: Outpatient: [facility]  Comments (or additional information):PT Single [employer]   Patient/Family verbalized understanding of follow-up arrangements: Yes  Individual responsible for coordination of the follow-up plan: [name] [phone]  Confirmed correct DME delivered: Dyanne Iha 04/12/2021    Dyanne Iha

## 2021-04-12 NOTE — Progress Notes (Signed)
Received patient in wheelchair. V/S stable and  respiration even and unlabored. Discharge instructions provided. Patient escorted to car by nurse tech. Patient left with all belongings.

## 2021-04-12 NOTE — Discharge Summary (Signed)
Physician Discharge Summary  Patient ID: [name] [name] MRN: [id number] DOB/AGE: 78-Jul-1942 78 y.o.  Admit date: 04/06/2021 Discharge date: 04/12/2021  Discharge Diagnoses:  Principal Problem:   Right thalamic infarction [facility]) Active Problems:   Essential hypertension   Type 2 diabetes mellitus ([place])   Unsteady gait Hyperlipidemia History of CAD with CABG   Discharged Condition: Stable  Significant Diagnostic Studies: CT ANGIO HEAD NECK W WO CM (CODE STROKE)  Result Date: 04/03/2021 CLINICAL DATA:  Stroke follow-up. EXAM: CT ANGIOGRAPHY HEAD AND NECK TECHNIQUE: Multidetector CT imaging of the head and neck was performed using the standard protocol during bolus administration of intravenous contrast. Multiplanar CT image reconstructions and MIPs were obtained to evaluate the vascular anatomy. Carotid stenosis measurements (when applicable) are obtained utilizing NASCET criteria, using the distal internal carotid diameter as the denominator. CONTRAST:  43m OMNIPAQUE IOHEXOL 350 MG/ML SOLN COMPARISON:  Same day head CT.  MRA June 04, 2009. FINDINGS: CTA NECK FINDINGS Aortic arch: Great vessel origins are patent. Atherosclerosis of the aorta and branch vessels. Right carotid system: Atherosclerosis of the common carotid artery and at the carotid bifurcation without greater than 50% stenosis. Left carotid system: Mixed calcific and noncalcific atherosclerosis at the carotid bifurcation and involving the proximal ICA with approximately 70-80% stenosis of the proximal ICA. Vertebral arteries: Left dominant. Approximately 50% stenosis of the right vertebral artery origin secondary to atherosclerosis. Mild narrowing of the left vertebral artery origin secondary to predominately calcific atherosclerosis. Remainder of the vertebral arteries are patent bilaterally. z Skeleton: Mild multilevel degenerative disc disease. Other neck: No mass or suspicious adenopathy. Upper chest: Visualized lung apices are  clear. Partially imaged median sternotomy. Review of the MIP images confirms the above findings CTA HEAD FINDINGS Anterior circulation: Bilateral calcific atherosclerosis of the cavernous and paraclinoid ICAs with approximately 60% stenosis of the left cavernous ICA. No evidence of greater than 50% stenosis on the right. Bilateral MCA and ACAs are patent without evidence of proximal hemodynamically significant stenosis. 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 Trifurcation of the ACA. No aneurysm identified. Posterior circulation: Bilateral intradural vertebral arteries and basilar artery are patent with mild narrowing of the right vertebral artery at its dural margin. Left fetal type PCA. Bilateral PCAs are patent without evidence of proximal hemodynamically significant stenosis. No aneurysm identified Venous sinuses: As permitted by contrast timing, patent. Anatomic variants: As detailed above Review of the MIP images confirms the above findings IMPRESSION: CTA Head: 1. No emergent large vessel occlusion. 2. 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 CTA Neck: 1. Approximately 70-80% stenosis of the proximal internal carotid artery just distal to the carotid bifurcation. 2. Approximately 50% stenosis of the right vertebral artery origin. Electronically Signed   By: FMargaretha SheffieldMD   On: 04/03/2021 08:25   CT HEAD WO CONTRAST  Result Date: 04/03/2021 CLINICAL DATA:  80year old male with numbness tingling, paresthesia. EXAM: CT HEAD WITHOUT CONTRAST TECHNIQUE: Contiguous axial images were obtained from the base of the skull through the vertex without intravenous contrast. COMPARISON:  Brain MRI 06/04/2009.  Head CT 06/03/2009. FINDINGS: Brain: No midline shift, mass effect, or evidence of intracranial mass lesion. No ventriculomegaly. No acute intracranial hemorrhage identified. Patchy and confluent hypodensity in the bilateral  cerebral white matter, deep gray matter nuclei. This is mildly progressed since 2010. No cortically based acute infarct identified. No cortical encephalomalacia identified. Vascular: Calcified atherosclerosis at the skull base. No suspicious  intracranial vascular hyperdensity. Skull: Stable.  No acute osseous abnormality identified. Sinuses/Orbits: Visualized paranasal sinuses and mastoids are stable and well pneumatized. Other: Tiny right forehead scalp lipoma (benign series 4, image 37). Postoperative changes to both globes. No acute orbit or scalp soft tissue finding. IMPRESSION: 1. No acute intracranial abnormality identified. 2. Advanced chronic small vessel disease. Mild progression since 2010. Electronically Signed   By: Genevie Ann M.D.   On: 04/03/2021 06:02   MR BRAIN WO CONTRAST  Result Date: 04/03/2021 CLINICAL DATA:  TIA. Left facial numbness and left leg weakness, now resolved. EXAM: MRI HEAD WITHOUT CONTRAST TECHNIQUE: Multiplanar, multiecho pulse sequences of the brain and surrounding structures were obtained without intravenous contrast. COMPARISON:  Head CT and CTA 04/03/2021.  Head MRI 06/04/2009. FINDINGS: The study is mildly motion degraded. Brain: There is an 8 mm focus of mild trace diffusion weighted signal hyperintensity laterally in the right thalamus with the suggestion of subtly reduced ADC (most conspicuous on the coronal ADC map) likely reflecting an acute infarct with this clinical history. Patchy to confluent T2 hyperintensities in the cerebral white matter bilaterally have mildly progressed from the prior MRI and are nonspecific but compatible with moderately severe chronic small vessel ischemic disease. Chronic lacunar infarcts are again noted in the deep cerebral white matter bilaterally, right basal ganglia, left thalamus, and pons. There is mild generalized cerebral atrophy. No intracranial hemorrhage, mass, midline shift, or extra-axial fluid collection is identified. Vascular:  Major intracranial vascular flow voids are preserved. Skull and upper cervical spine: Unremarkable bone marrow signal. Sinuses/Orbits: Unremarkable orbits. Trace right mastoid effusion. Clear paranasal sinuses. Other: Small right frontal scalp lipoma. IMPRESSION: 1. Suspected small acute right thalamic infarct. 2. 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 Electronically Signed   By: Logan Bores M.D.   On: 04/03/2021 11:02   ECHOCARDIOGRAM COMPLETE  Result Date: 04/03/2021    ECHOCARDIOGRAM REPORT   Patient Name:   [name] [name] Date of Exam: 04/03/2021 Medical Rec #:  [id number]        Height:       72.0 in Accession #:    [id number]       Weight:       231.0 lb Date of Birth:  13-Oct-1941       BSA:          2.265 m Patient Age:    42 years         BP:           159/76 mmHg Patient Gender: M                HR:           71 bpm. Exam Location:  Inpatient Procedure: 2D Echo, Cardiac Doppler and Color Doppler Indications:    TIA  History:        Patient has no prior history of Echocardiogram examinations.                 CAD, TIA; Risk Factors:Hypertension and Dyslipidemia.  Sonographer:    [place] Referring Phys: [id number] [place]  1. Left ventricular ejection fraction, by estimation, is 60 to 65%. The left ventricle has normal function. The left ventricle has no regional wall motion abnormalities. Left ventricular diastolic parameters are indeterminate.  2. Right ventricular systolic function is normal. The right ventricular size is normal.  3. The mitral valve is normal in structure. No evidence of mitral valve  regurgitation. No evidence of mitral stenosis.  4. The aortic valve is tricuspid. There is mild calcification of the aortic valve. Aortic valve regurgitation is not visualized. Mild to moderate aortic valve sclerosis/calcification is present, without any evidence of aortic stenosis.  5. The inferior vena cava is normal in size  with greater than 50% respiratory variability, suggesting right atrial pressure of 3 mmHg. FINDINGS  Left Ventricle: Left ventricular ejection fraction, by estimation, is 60 to 65%. The left ventricle has normal function. The left ventricle has no regional wall motion abnormalities. The left ventricular internal cavity size was normal in size. There is  no left ventricular hypertrophy. Left ventricular diastolic parameters are indeterminate. Right Ventricle: The right ventricular size is normal. No increase in right ventricular wall thickness. Right ventricular systolic function is normal. Left Atrium: Left atrial size was normal in size. Right Atrium: Right atrial size was normal in size. Pericardium: There is no evidence of pericardial effusion. Mitral Valve: The mitral valve is normal in structure. No evidence of mitral valve regurgitation. No evidence of mitral valve stenosis. Tricuspid Valve: The tricuspid valve is normal in structure. Tricuspid valve regurgitation is trivial. No evidence of tricuspid stenosis. Aortic Valve: The aortic valve is tricuspid. There is mild calcification of the aortic valve. Aortic valve regurgitation is not visualized. Mild to moderate aortic valve sclerosis/calcification is present, without any evidence of aortic stenosis. Aortic valve mean gradient measures 3.5 mmHg. Aortic valve peak gradient measures 6.1 mmHg. Aortic valve area, by VTI measures 2.12 cm. Pulmonic Valve: The pulmonic valve was normal in structure. Pulmonic valve regurgitation is mild. No evidence of pulmonic stenosis. Aorta: The aortic root is normal in size and structure. Venous: The inferior vena cava is normal in size with greater than 50% respiratory variability, suggesting right atrial pressure of 3 mmHg. IAS/Shunts: The interatrial septum was not well visualized.  LEFT VENTRICLE PLAX 2D LVIDd:         4.60 cm LVIDs:         3.40 cm LV PW:         1.30 cm LV IVS:        1.40 cm LVOT diam:     1.80 cm LV SV:          53 LV SV Index:   23 LVOT Area:     2.54 cm  LV Volumes (MOD) LV vol d, MOD A2C: 35.9 ml LV vol d, MOD A4C: 70.2 ml LV vol s, MOD A4C: 24.8 ml LV SV MOD A4C:     70.2 ml RIGHT VENTRICLE TAPSE (M-mode): 1.2 cm LEFT ATRIUM             Index       RIGHT ATRIUM           Index LA Vol (A2C):   59.6 ml 26.31 ml/m RA Area:     13.10 cm LA Vol (A4C):   52.6 ml 23.22 ml/m RA Volume:   28.50 ml  12.58 ml/m LA Biplane Vol: 57.4 ml 25.34 ml/m  AORTIC VALVE                   PULMONIC VALVE AV Area (Vmax):    1.90 cm    PV Vmax:       0.97 m/s AV Area (Vmean):   1.98 cm    PV Vmean:      71.800 cm/s AV Area (VTI):     2.12 cm    PV VTI:  0.216 m AV Vmax:           123.50 cm/s PV Peak grad:  3.8 mmHg AV Vmean:          88.350 cm/s PV Mean grad:  2.0 mmHg AV VTI:            0.249 m AV Peak Grad:      6.1 mmHg AV Mean Grad:      3.5 mmHg LVOT Vmax:         92.30 cm/s LVOT Vmean:        68.700 cm/s LVOT VTI:          0.207 m LVOT/AV VTI ratio: 0.83  AORTA Ao Root diam: 3.40 cm Ao Asc diam:  2.90 cm MITRAL VALVE MV Area (PHT): 3.85 cm    SHUNTS MV Decel Time: 197 msec    Systemic VTI:  0.21 m MV E velocity: 70.40 cm/s  Systemic Diam: 1.80 cm MV A velocity: 77.10 cm/s MV E/A ratio:  0.91 [name] MD Electronically signed by [name] MD Signature Date/Time: 04/03/2021/3:27:08 PM    Final     Labs:  Basic Metabolic Panel: Recent Labs  Lab 04/06/21 1346 04/07/21 0524  NA  --  138  K  --  4.2  CL  --  105  CO2  --  29  GLUCOSE  --  118*  BUN  --  13  CREATININE 1.04 1.09  CALCIUM  --  8.5*    CBC: Recent Labs  Lab 04/06/21 1346 04/07/21 0524  WBC 6.8 7.1  NEUTROABS  --  4.6  HGB 15.9 15.9  HCT 45.1 44.7  MCV 99.6 100.2*  PLT 204 197    CBG: No results for input(s): GLUCAP in the last 168 hours.  Family history.  Mother with CAD diabetes and kidney disease.  Father with lung cancer.  Brother with diabetes mellitus.  Negative colon cancer or CVA  Brief HPI:   [name] [name]  is a 80 y.o. right-handed male with history of TIA, hypertension, hyperlipidemia, CAD with CABG 2005 maintained on aspirin, right CEA.  Patient lives with spouse independent prior to admission using assistive device.  1 level home 3 steps to entry.  Presented 04/03/2021 with acute onset of left-sided weakness.  Cranial CT scan negative for acute changes.  CT angiogram head and neck showed no emergent large vessel occlusion.  Approximately 50% stenosis of the left cavernous internal carotid artery and approximately 70 to 80% of the proximal ICA just distal to the carotid bifurcation.  MRI of the brain showed small acute right thalamic infarction.  Echocardiogram with ejection fraction of 60 to 65% no wall motion abnormalities.  Admission chemistries unremarkable except glucose 190 creatinine 1.25 alcohol negative.  Maintained on aspirin plus Plavix for CVA prophylaxis x3 weeks then Plavix alone.  Subcutaneous Lovenox for DVT prophylaxis.  Therapy evaluations completed due to patient decreased functional ability gait abnormality was admitted for a comprehensive rehab program   Hospital Course: [name] [name] was admitted to rehab 04/06/2021 for inpatient therapies to consist of PT, ST and OT at least three hours five days a week. Past admission physiatrist, therapy team and rehab RN have worked together to provide customized collaborative inpatient rehab.  Pertaining to patient's right thalamic infarction remained stable he will continue aspirin Plavix x3 weeks total and Plavix alone.  He did have a history of a right CEA as well as left ICA stenosis followed by Dr. Charlestine Massed.  Blood  pressure controlled on Coreg as well as Cozaar will need outpatient follow-up.  Lipitor for hyperlipidemia.  History of CAD with CABG no chest pain or shortness of breath he will continue aspirin Plavix as directed.  Blood sugars overall controlled hemoglobin A1c of 6.0 diabetic teaching.   Blood pressures were monitored on TID basis and  controlled  Diabetes has been monitored with ac/hs CBG checks and SSI was use prn for tighter BS control.    Rehab course: During patient's stay in rehab weekly team conferences were held to monitor patient's progress, set goals and discuss barriers to discharge. At admission, patient required moderate assist gait without assistive device minimal assist sit to stand min mod assist ADLs  Physical exam.  Blood pressure 160/68 pulse 63 temperature 97.8 respirations 18 oxygen saturations 91% room air Constitutional.  No acute distress HEENT Head.  Mild left facial droop Eyes.  Pupils round and reactive to light no discharge.nystagmus Neck.  Supple nontender no JVD without thyromegaly Cardiac regular rate rhythm not any extra sounds or murmur heard Abdomen.  Soft nontender positive bowel sounds without rebound Respiratory effort normal no respiratory distress without wheeze Musculoskeletal.  Normal range of motion Upper extremities 5/5 bilateral Lower extremity 5/5 bilateral in all muscles Neurologic.  Alert oriented left facial droop oriented x3 fair awareness of deficits.  Decreased rapid alternating movements slowed and not intact on the left.  He/She  has had improvement in activity tolerance, balance, postural control as well as ability to compensate for deficits. He/She has had improvement in functional use RUE/LUE  and RLE/LLE as well as improvement in awareness.  Patient ambulating greater than 200 feet independent without assistive device Berg balance testing 52/56.  Stair training independent able to ambulate 12 steps without handrail.  He was advised no driving.  He can gather his belongings for activities day living and homemaking.  Full family teaching completed plan discharged home       Disposition: Discharged home   Diet: Carb modified  Special Instructions: No driving smoking or alcohol  Plan aspirin 81 mg daily x2 more weeks and Plavix 75 mg daily and Plavix  alone  Medications at discharge 1.  Tylenol as needed 2.  Aspirin 81 mg daily x2 weeks and stop 3.  Lipitor 40 mg p.o. daily 4.  Coreg 3.125 mg p.o. twice daily 5.  Plavix 35 mg p.o. daily 6.  Cozaar 50 mg p.o. daily  30-35 minutes were spent completing discharge summary and discharge planning  Discharge Instructions    Ambulatory referral to Neurology   Complete by: As directed    An appointment is requested in approximately right thalamic infarction   Ambulatory referral to Physical Medicine Rehab   Complete by: As directed    Moderate complexity follow-up 1 to 2 weeks right thalamic infarction     Allergies as of 04/12/2021      Reactions   Niacin Other (See Comments)   REACTION: intol to NIACIN w/ headaches REACTION: intol to NIACIN w/ headaches      Medication List    STOP taking these medications   blood glucose meter kit and supplies Kit     TAKE these medications   acetaminophen 325 MG tablet Commonly known as: TYLENOL Take 2 tablets (650 mg total) by mouth every 4 (four) hours as needed for mild pain (or temp > 37.5 C (99.5 F)).   aspirin EC 81 MG tablet Take 1 tablet (81 mg total) by mouth daily.   atorvastatin  40 MG tablet Commonly known as: LIPITOR Take 1 tablet (40 mg total) by mouth daily.   carvedilol 3.125 MG tablet Commonly known as: COREG Take 1 tablet (3.125 mg total) by mouth 2 (two) times daily with a meal.   clopidogrel 75 MG tablet Commonly known as: PLAVIX Take 1 tablet (75 mg total) by mouth daily.   losartan 25 MG tablet Commonly known as: COZAAR Take 2 tablets (50 mg total) by mouth daily.   nitroGLYCERIN 0.4 MG SL tablet Commonly known as: NITROSTAT Place 1 tablet (0.4 mg total) under the tongue every 5 (five) minutes as needed for chest pain.   vitamin B-12 1000 MCG tablet Commonly known as: CYANOCOBALAMIN Take 1,000 mcg by mouth daily.   Vitamin D 50 MCG (2000 UT) Caps Take 1 capsule (2,000 Units total) by mouth daily.        Follow-up Information    Kirsteins, Luanna Salk, MD Follow up.   Specialty: Physical Medicine and Rehabilitation Why: Office to call for appointment Contact information: [place] [address] [phone]               Signed: Cathlyn Parsons 04/12/2021, 10:27 AM

## 2021-04-12 NOTE — Progress Notes (Signed)
PROGRESS NOTE   Subjective/Complaints:  Discussed no driving until rehab  F/u , no c/os  ROS-  Pt denies SOB, abd pain, CP, N/V/C/D, and vision changes   Objective:   No results found. No results for input(s): WBC, HGB, HCT, PLT in the last 72 hours. No results for input(s): NA, K, CL, CO2, GLUCOSE, BUN, CREATININE, CALCIUM in the last 72 hours.  Intake/Output Summary (Last 24 hours) at 04/12/2021 0816 Last data filed at 04/12/2021 0757 Gross per 24 hour  Intake 708 ml  Output --  Net 708 ml        Physical Exam: Vital Signs Blood pressure (!) 143/67, pulse 61, temperature 97.9 F (36.6 C), temperature source Oral, resp. rate 17, SpO2 (!) 89 %.  General: No acute distress Mood and affect are appropriate Heart: Regular rate and rhythm no rubs murmurs or extra sounds Lungs: Clear to auscultation, breathing unlabored, no rales or wheezes Abdomen: Positive bowel sounds, soft nontender to palpation, nondistended Extremities: No clubbing, cyanosis, or edema Skin: No evidence of breakdown, no evidence of rash Neurologic: Cranial nerves II through XII intact, motor strength is 5/5 in R and 4/5 L deltoid, bicep, tricep, grip, hip flexor, knee extensors, ankle dorsiflexor and plantar flexor Sensory exam normal sensation to light touch and proprioception in bilateral upper and lower extremities Cerebellar exam normal finger to nose to finger Musculoskeletal: Full range of motion in all 4 extremities. No joint swelling   Assessment/Plan: 1. Functional deficits which require 3+ hours per day of interdisciplinary therapy in a comprehensive inpatient rehab setting.  Physiatrist is providing close team supervision and 24 hour management of active medical problems listed below.  Physiatrist and rehab team continue to assess barriers to discharge/monitor patient progress toward functional and medical goals  Care  Tool:  Bathing    Body parts bathed by patient: Right arm,Left arm,Chest,Abdomen,Front perineal area,Buttocks,Right upper leg,Left upper leg,Face,Left lower leg,Right lower leg         Bathing assist Assist Level: Independent with assistive device     Upper Body Dressing/Undressing Upper body dressing   What is the patient wearing?: Pull over shirt    Upper body assist Assist Level: Independent    Lower Body Dressing/Undressing Lower body dressing      What is the patient wearing?: Pants,Underwear/pull up     Lower body assist Assist for lower body dressing: Independent with assitive device     Toileting Toileting    Toileting assist Assist for toileting: Independent with assistive device     Transfers Chair/bed transfer  Transfers assist     Chair/bed transfer assist level: Independent     Locomotion Ambulation   Ambulation assist      Assist level: Independent Assistive device: No Device Max distance: 280ft   Walk 10 feet activity   Assist     Assist level: Independent Assistive device: No Device   Walk 50 feet activity   Assist    Assist level: Independent Assistive device: No Device    Walk 150 feet activity   Assist Walk 150 feet activity did not occur: Safety/medical concerns  Assist level: Independent Assistive device: No Device    Walk  10 feet on uneven surface  activity   Assist     Assist level: Supervision/Verbal cueing Assistive device: Hand held assist   Wheelchair     Assist Will patient use wheelchair at discharge?: No             Wheelchair 50 feet with 2 turns activity    Assist            Wheelchair 150 feet activity     Assist          Blood pressure (!) 143/67, pulse 61, temperature 97.9 F (36.6 C), temperature source Oral, resp. rate 17, SpO2 (!) 89 %.   Medical Problem List and Plan: 1.  Left facial droop and impaired balance/and impaired movements due to  ataxia/apraxiasecondary to right thalamic infarction likely small vessel disease          d/c home today   2.  Antithrombotics: -DVT/anticoagulation: Lovenox             -antiplatelet therapy: Aspirin 81 mg daily and Plavix 75 mg daily x3 weeks then Plavix alone Hx R CEA, has Left ICA stenosis f/u [facility]- Dr Donnetta Hutching 3. Pain Management: Tylenol as needed 4. Mood: Provide emotional support             -antipsychotic agents: N/A 5. Neuropsych: This patient is capable of making decisions on his own behalf. 6. Skin/Wound Care: Routine skin checks 7. Fluids/Electrolytes/Nutrition: Routine in and outs with follow-up chemistries 8.  Hypertension.  Coreg 3.125 mg twice daily Cozaar 50 mg daily..  Monitor with increased mobility Vitals:   04/11/21 1918 04/12/21 0405  BP: (!) 153/76 (!) 143/67  Pulse: 66 61  Resp: 17 17  Temp: 98.2 F (36.8 C) 97.9 F (36.6 C)  SpO2: 92% (!) 89%   4/12 fair/good control 9.  Hyperlipidemia.  Lipitor 10.  History of CAD with CABG.  Continue aspirin and Plavix.  No chest pain or shortness of breath 11.  Type 2 diabetes mellitus.  Hemoglobin A1c 6.0.  Diabetic diet   12.  Questionable medical compliance.  Provide counseling 13. Dry skin- will order eucerin BID and d/c IV since not using.     LOS: 6 days A FACE TO FACE EVALUATION WAS PERFORMED  Charlett Blake 04/12/2021, 8:16 AM

## 2021-04-12 NOTE — Discharge Instructions (Signed)
Inpatient Rehab Discharge Instructions  LAFE CLERK Discharge date and time: No discharge date for patient encounter.   Activities/Precautions/ Functional Status: Activity: activity as tolerated Diet: regular diet Wound Care: Routine skin checks Functional status:  ___ No restrictions     ___ Walk up steps independently ___ 24/7 supervision/assistance   ___ Walk up steps with assistance ___ Intermittent supervision/assistance  ___ Bathe/dress independently ___ Walk with walker     __x_ Bathe/dress with assistance ___ Walk Independently    ___ Shower independently ___ Walk with assistance    ___ Shower with assistance ___ No alcohol     ___ Return to work/school ________  COMMUNITY REFERRALS UPON DISCHARGE:    Outpatient: PT                 Agency: [facility] Phone: [phone]              Appointment Date/Time: TBD by Facility at Discharge  Medical Equipment/Items Ordered: [facility] [place]                                                  Agency/Supplier: [employer]   Special Instructions: No driving smoking or alcohol.  Continue aspirin 81 mg and Plavix 75 mg x2 more  weeks then Plavix alone  STROKE/TIA DISCHARGE INSTRUCTIONS SMOKING Cigarette smoking nearly doubles your risk of having a stroke & is the single most alterable risk factor  If you smoke or have smoked in the last 12 months, you are advised to quit smoking for your health.  Most of the excess cardiovascular risk related to smoking disappears within a year of stopping.  Ask you doctor about anti-smoking medications  [place] Quit Line: 1-800-QUIT NOW  Free Smoking Cessation Classes [phone]  CHOLESTEROL Know your levels; limit fat & cholesterol in your diet  Lipid Panel     Component Value Date/Time   CHOL 143 04/03/2021 1111   TRIG 130 04/03/2021 1111   HDL 31 (L) 04/03/2021 1111   CHOLHDL 4.6 04/03/2021 1111   VLDL 26 04/03/2021 1111   LDLCALC 86 04/03/2021 1111       Many patients benefit from treatment even if their cholesterol is at goal.  Goal: Total Cholesterol (CHOL) less than 160  Goal:  Triglycerides (TRIG) less than 150  Goal:  HDL greater than 40  Goal:  LDL (LDLCALC) less than 100   BLOOD PRESSURE American Stroke Association blood pressure target is less that 120/80 mm/Hg  Your discharge blood pressure is:     Monitor your blood pressure  Limit your salt and alcohol intake  Many individuals will require more than one medication for high blood pressure  DIABETES (A1c is a blood sugar average for last 3 months) Goal HGBA1c is under 7% (HBGA1c is blood sugar average for last 3 months)  Diabetes:     Lab Results  Component Value Date   HGBA1C 6.7 (H) 04/03/2021     Your HGBA1c can be lowered with medications, healthy diet, and exercise.  Check your blood sugar as directed by your physician  Call your physician if you experience unexplained or low blood sugars.  PHYSICAL ACTIVITY/REHABILITATION Goal is 30 minutes at least 4 days per week  Activity: Increase activity slowly, Therapies: Physical Therapy: Home Health Return to work:   Activity decreases your risk of  heart attack and stroke and makes your heart stronger.  It helps control your weight and blood pressure; helps you relax and can improve your mood.  Participate in a regular exercise program.  Talk with your doctor about the best form of exercise for you (dancing, walking, swimming, cycling).  DIET/WEIGHT Goal is to maintain a healthy weight  Your discharge diet is:  Diet Order            Diet Heart Room service appropriate? Yes; Fluid consistency: Thin  Diet effective ____                 liquids Your height is:    Your current weight is:   Your Body Mass Index (BMI) is:     Following the type of diet specifically designed for you will help prevent another stroke.  Your goal weight range is:    Your goal Body Mass Index (BMI) is 19-24.  Healthy  food habits can help reduce 3 risk factors for stroke:  High cholesterol, hypertension, and excess weight.  RESOURCES Stroke/Support Group:  Call [phone]   STROKE EDUCATION PROVIDED/REVIEWED AND GIVEN TO PATIENT Stroke warning signs and symptoms How to activate emergency medical system (call 911). Medications prescribed at discharge. Need for follow-up after discharge. Personal risk factors for stroke. Pneumonia vaccine given:  Flu vaccine given:  My questions have been answered, the writing is legible, and I understand these instructions.  I will adhere to these goals & educational materials that have been provided to me after my discharge from the hospital.    alone    My questions have been answered and I understand these instructions. I will adhere to these goals and the provided educational materials after my discharge from the hospital.  Patient/Caregiver Signature _______________________________ Date __________  Clinician Signature _______________________________________ Date __________  Please bring this form and your medication list with you to all your follow-up doctor's appointments.

## 2021-04-13 ENCOUNTER — Telehealth: Payer: Self-pay

## 2021-04-13 NOTE — Telephone Encounter (Signed)
Transitional Care call--[name] [name]-wife of patient   1. Are you/is patient experiencing any problems since coming home? Outpt Neuro therapy Are there any questions regarding any aspect of care? No 2. Are there any questions regarding medications administration/dosing? No Are meds being taken as prescribed? Yes Patient should review meds with caller to confirm 3. Have there been any falls? No 4. Has Home Health been to the house and/or have they contacted you? [facility], have you tried to contact them? Can we help you contact them? 5. Are bowels and bladder emptying properly? Yes Are there any unexpected incontinence issues? No If applicable, is patient following bowel/bladder programs? 6. Any fevers, problems with breathing, unexpected pain? No 7. Are there any skin problems or new areas of breakdown? No 8. Has the patient/family member arranged specialty MD follow up (ie cardiology/neurology/renal/surgical/etc)? [facility] and [facility] Can we help arrange? 9. Does the patient need any other services or support that we can help arrange? No 10. Are caregivers following through as expected in assisting the patient? Yes 11. Has the patient quit smoking, drinking alcohol, or using drugs as recommended? Yes  Appointment time 2:20 pm, arrive time 2:00 pm on 04/24/21 with Zella Ball then Dr. Letta Pate  [address]   While talking with patient and wife, they said they don't have an appointment for therapy yet and they have been giving multiple numbers to call. Patient previously saw Dr. Tomi Likens but did not want to go back to see Dr. Tomi Likens after discharge. Patient prefers [facility] on [address]. I called Neuro Rehab and spoke to [facility], she is going to reopen order for therapy and someone will call pt to schedule. Patient and wife is aware.

## 2021-04-17 ENCOUNTER — Other Ambulatory Visit: Payer: Self-pay | Admitting: Cardiovascular Disease

## 2021-04-17 ENCOUNTER — Telehealth: Payer: Self-pay | Admitting: *Deleted

## 2021-04-17 DIAGNOSIS — I639 Cerebral infarction, unspecified: Secondary | ICD-10-CM

## 2021-04-17 DIAGNOSIS — I6381 Other cerebral infarction due to occlusion or stenosis of small artery: Secondary | ICD-10-CM

## 2021-04-17 NOTE — Progress Notes (Addendum)
NEUROLOGY FOLLOW UP OFFICE NOTE  NICOLE HAFLEY [id number]  Assessment/Plan:   1.  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  Asymptomatic left internal carotid artery stenosis 3.  Type 2 diabetes mellitus 4.  Hypertension 5.  Hyperlipidemia 6.  Coronary artery disease  1.  Secondary stroke prevention as managed by PCP: -  Continue dual antiplatelet therapy for rest of 21 days and then Plavix 75mg  daily alone. -  Lipitor 40mg  daily.  LDL goal less than 70 -  Glycemic control.  Hgb A1c goal less than 70 -  Blood pressure control 2.  Mediterranean diet 3.  Follow up with vascular surgery/have carotid US done as per Dr. Johnsie Cancel 4.  Tobacco cessation 5.  Follow up 6 months.  Subjective:  PRESCOTT TRUEX is a 80 year old left-handed male with HTN, HLD, DMII, CAD, carotid artery disease s/p R CEA and history of TIA whom I previously saw for unsteady gait who presents for transition care management.  He is accompanied by his wife.  History supplemented by hospital records.  CTA head and neck and MRI brain personally reviewed.  Mouth, finger , leg 90 minutes - in ED symptoms returned -   He was admitted to [facility] from 04/03/2021 to 04/06/2021 for transient left-sided numbness and weakness lasting an hour.  CT head was negative for follow up MRI of brain showed acute small right thalamic infarct with chronic small vessel ischemic changes and multiple chronic lacunar infarcts.  CTA of head and neck showed 70-80% left ICA stenosis.  2D echo showed EF 60-65% with no cardiac source of embolus.  LDL was 86 and Hgb A1c 6.7.  Prior to admission, hne was on ASA 81mg  daily.  He was discharged on ASA 81mg  and Plavix 75mg  daily for 3 weeks followed by Plavix alone.  Lipitor was increased from 10 to 40mg  daily.  He is to follow up with outpatient vascular surgery regarding the left ICA stenosis.  Currently on DAPT for two more weeks, followed up Plavix  alone.  Still sometimes has numbness on left corner of mouth, left index finger and thumb.   PAST MEDICAL HISTORY: Past Medical History:  Diagnosis Date  . Basal cell carcinoma 11/09/2019   nod & infil-behind right ear-cx3 &exc  . Basal cell carcinoma 03/21/2020   Residual BCC with peripheral margin involved - ST recommends MOHs  . Carotid artery occlusion   . Coronary artery disease    s/p CABG 2005; sees Dr Johnsie Cancel yearly  . Gynecomastia   . Hypercholesterolemia   . Hypertension   . Neurodermatitis   . Overweight(278.02)   . Personal history of colonic polyps 10/08/2006   tubular adenomas  . Renal insufficiency   . Transient ischemic attack 2010   "lasted ~ 5 seconds"  . Vitamin D deficiency     MEDICATIONS: Current Outpatient Medications on File Prior to Visit  Medication Sig Dispense Refill  . acetaminophen (TYLENOL) 325 MG tablet Take 2 tablets (650 mg total) by mouth every 4 (four) hours as needed for mild pain (or temp > 37.5 C (99.5 F)).    [name] aspirin EC 81 MG tablet Take 1 tablet (81 mg total) by mouth daily. 21 tablet 0  . atorvastatin (LIPITOR) 40 MG tablet Take 1 tablet (40 mg total) by mouth daily. 30 tablet 0  . carvedilol (COREG) 3.125 MG tablet Take 1 tablet (3.125 mg total) by mouth 2 (two) times daily with  a meal. 60 tablet 0  . Cholecalciferol (VITAMIN D) 50 MCG (2000 UT) CAPS Take 1 capsule (2,000 Units total) by mouth daily. 30 capsule 0  . clopidogrel (PLAVIX) 75 MG tablet Take 1 tablet (75 mg total) by mouth daily. 90 tablet 3  . losartan (COZAAR) 25 MG tablet Take 2 tablets (50 mg total) by mouth daily. 180 tablet 1  . nitroGLYCERIN (NITROSTAT) 0.4 MG SL tablet Place 1 tablet (0.4 mg total) under the tongue every 5 (five) minutes as needed for chest pain. 25 tablet 3  . vitamin B-12 (CYANOCOBALAMIN) 1000 MCG tablet Take 1,000 mcg by mouth daily.     No current facility-administered medications on file prior to visit.    ALLERGIES: Allergies  Allergen  Reactions  . Niacin Other (See Comments)    REACTION: intol to NIACIN w/ headaches REACTION: intol to NIACIN w/ headaches    FAMILY HISTORY: Family History  Problem Relation Age of Onset  . Heart disease Mother        Before age 25  . Diabetes Mother   . Kidney disease Mother   . Heart attack Mother 83  . Lung cancer Father 7  . Diabetes Brother   . Heart disease Brother   . Heart disease Brother   . Arthritis Brother   . Diabetes Sister   . Fibromyalgia Sister   . Lung cancer Paternal Uncle        questionable as to if it was lung ca  . Healthy Daughter   . Colon cancer Neg Hx   . Stroke Neg Hx       Objective:  Blood pressure 120/76, pulse 78, height 6' (1.829 m), weight 225 lb (102.1 kg), SpO2 97 %. General: No acute distress.  Patient appears well-groomed.   Head:  Normocephalic/atraumatic Eyes:  Fundi examined but not visualized Neck: supple, no paraspinal tenderness, full range of motion Heart:  Regular rate and rhythm Lungs:  Clear to auscultation bilaterally Back: No paraspinal tenderness Neurological Exam: alert and oriented to person, place, and time. Speech fluent and not dysarthric, language intact.  CN II-XII intact. Bulk and tone normal, muscle strength 5/5 throughout.  Sensation to pinprick intact, vibratory sensation reduced in feet.  Deep tendon reflexes 2+ throughout, toes downgoing.  Finger to nose and heel to shin testing intact.  Wide-based gait.  Romberg negative.     Metta Clines, DO  CC: Alma Friendly, NP

## 2021-04-17 NOTE — Telephone Encounter (Signed)
Mrs Hoge called in today worrying because Mr Schulenburg has not been scheduled any therapy yet. Lysle Morales talked with them Thursday during TC call but they are not straight on the fact that Dr Letta Pate is not a neurologist and seeing Dr Tomi Likens vs [facility], and [address] being the place for therapy.  It looks like [facility] tried to set up patient but Mr and Mrs Daisey kept insisting it was for therapy he was to be scheduled. Dr Erlinda Hong had referred him to Dr Tomi Likens because he had been seen by Dr Tomi Likens in the past but it has been several years. The [facility] was closed for duplicate referral and no appt has been scheduled for either neurology or neuro rehab. I called outpt Neuro Rehab and they do not have anything for him about scheduling.  Mrs Durio does not understand Dr Letta Pate is [facility] and not a neurologist as well. I have explained the difference in MD services and that yes outpt neuro rehab in on [address], but [facility] is there as well. I have placed a referral for the outpt neuro rehab, and am reaching out to Dr Georgie Chard office to verify they are going to see him soon.

## 2021-04-17 NOTE — Telephone Encounter (Signed)
I have spoken with Mrs Lorenson again after calling Dr Georgie Chard office.  They have an opening tomorrow and will see Mr Bonser. I have given Mrs Cazarez the number for outpt neuro rehab and she should be able to call them now that the referral is in place.

## 2021-04-18 ENCOUNTER — Ambulatory Visit: Payer: Medicare PPO | Admitting: Neurology

## 2021-04-18 ENCOUNTER — Encounter: Payer: Self-pay | Admitting: Neurology

## 2021-04-18 ENCOUNTER — Ambulatory Visit: Payer: Medicare PPO | Admitting: Cardiovascular Disease

## 2021-04-18 ENCOUNTER — Other Ambulatory Visit: Payer: Self-pay

## 2021-04-18 ENCOUNTER — Encounter: Payer: Self-pay | Admitting: Cardiovascular Disease

## 2021-04-18 VITALS — BP 120/76 | HR 78 | Ht 72.0 in | Wt 225.0 lb

## 2021-04-18 VITALS — BP 142/60 | HR 67 | Ht 72.0 in | Wt 227.0 lb

## 2021-04-18 DIAGNOSIS — I6522 Occlusion and stenosis of left carotid artery: Secondary | ICD-10-CM

## 2021-04-18 DIAGNOSIS — E782 Mixed hyperlipidemia: Secondary | ICD-10-CM | POA: Diagnosis not present

## 2021-04-18 DIAGNOSIS — E119 Type 2 diabetes mellitus without complications: Secondary | ICD-10-CM | POA: Diagnosis not present

## 2021-04-18 DIAGNOSIS — Z951 Presence of aortocoronary bypass graft: Secondary | ICD-10-CM

## 2021-04-18 DIAGNOSIS — G459 Transient cerebral ischemic attack, unspecified: Secondary | ICD-10-CM

## 2021-04-18 DIAGNOSIS — I6521 Occlusion and stenosis of right carotid artery: Secondary | ICD-10-CM

## 2021-04-18 DIAGNOSIS — I10 Essential (primary) hypertension: Secondary | ICD-10-CM

## 2021-04-18 DIAGNOSIS — I6381 Other cerebral infarction due to occlusion or stenosis of small artery: Secondary | ICD-10-CM

## 2021-04-18 DIAGNOSIS — I639 Cerebral infarction, unspecified: Secondary | ICD-10-CM

## 2021-04-18 NOTE — Addendum Note (Signed)
Addended byTomi Likens, [place] R on: 04/18/2021 04:01 PM   Modules accepted: Level of Service

## 2021-04-18 NOTE — Patient Instructions (Addendum)
Medication Instructions:  NO CHANGES *If you need a refill on your cardiac medications before your next appointment, please call your pharmacy*   Lab Work: NONE If you have labs (blood work) drawn today and your tests are completely normal, you will receive your results only by: [name] MyChart Message (if you have MyChart) OR . A paper copy in the mail If you have any lab test that is abnormal or we need to change your treatment, we will call you to review the results.   Testing/Procedures: Your physician has requested that you have a carotid duplex. This test is an ultrasound of the carotid arteries in your neck. It looks at blood flow through these arteries that supply the brain with blood. Allow one hour for this exam. There are no restrictions or special instructions. STROKE   Follow-Up: At [facility], you and your health needs are our priority.  As part of our continuing mission to provide you with exceptional heart care, we have created designated Provider Care Teams.  These Care Teams include your primary Cardiologist (physician) and Advanced Practice Providers (APPs -  Physician Assistants and Nurse Practitioners) who all work together to provide you with the care you need, when you need it.  We recommend signing up for the patient portal called "MyChart".  Sign up information is provided on this After Visit Summary.  MyChart is used to connect with patients for Virtual Visits (Telemedicine).  Patients are able to view lab/test results, encounter notes, upcoming appointments, etc.  Non-urgent messages can be sent to your provider as well.   To learn more about what you can do with MyChart, go to [email or web address].    Your next appointment:   3  month(s)  The format for your next appointment:   In Person  Provider:   [name], MD   Other Instructions NEEDS FOLLOW UP WITH DR EARLY IN THE NEXT 2 WEEKS    You have been referred to  [place]

## 2021-04-18 NOTE — Patient Instructions (Signed)
1.  After 21 days is done, stop aspirin and continue Plavix 75mg  daily alone 2.  Continue atorvastatin 40mg  daily 3.  Continue medications for diabetes and blood pressure 4.  Continue physical therapy 5.  Mediterranean diet (see below) 6.  Stop tobacco use 7.  Follow up 6 months.   Mediterranean Diet A Mediterranean diet refers to food and lifestyle choices that are based on the traditions of countries located on the [employer]. This way of eating has been shown to help prevent certain conditions and improve outcomes for people who have chronic diseases, like kidney disease and heart disease. What are tips for following this plan? Lifestyle  Cook and eat meals together with your family, when possible.  Drink enough fluid to keep your urine clear or pale yellow.  Be physically active every day. This includes: ? Aerobic exercise like running or swimming. ? Leisure activities like gardening, walking, or housework.  Get 7-8 hours of sleep each night.  If recommended by your health care provider, drink red wine in moderation. This means 1 glass a day for nonpregnant women and 2 glasses a day for men. A glass of wine equals 5 oz (150 mL). Reading food labels  Check the serving size of packaged foods. For foods such as rice and pasta, the serving size refers to the amount of cooked product, not dry.  Check the total fat in packaged foods. Avoid foods that have saturated fat or trans fats.  Check the ingredients list for added sugars, such as corn syrup.   Shopping  At the grocery store, buy most of your food from the areas near the walls of the store. This includes: ? Fresh fruits and vegetables (produce). ? Grains, beans, nuts, and seeds. Some of these may be available in unpackaged forms or large amounts (in bulk). ? Fresh seafood. ? Poultry and eggs. ? Low-fat dairy products.  Buy whole ingredients instead of prepackaged foods.  Buy fresh fruits and vegetables in-season  from local farmers markets.  Buy frozen fruits and vegetables in resealable bags.  If you do not have access to quality fresh seafood, buy precooked frozen shrimp or canned fish, such as tuna, salmon, or sardines.  Buy small amounts of raw or cooked vegetables, salads, or olives from the deli or salad bar at your store.  Stock your pantry so you always have certain foods on hand, such as olive oil, canned tuna, canned tomatoes, rice, pasta, and beans. Cooking  Cook foods with extra-virgin olive oil instead of using butter or other vegetable oils.  Have meat as a side dish, and have vegetables or grains as your main dish. This means having meat in small portions or adding small amounts of meat to foods like pasta or stew.  Use beans or vegetables instead of meat in common dishes like chili or lasagna.  Experiment with different cooking methods. Try roasting or broiling vegetables instead of steaming or sauteing them.  Add frozen vegetables to soups, stews, pasta, or rice.  Add nuts or seeds for added healthy fat at each meal. You can add these to yogurt, salads, or vegetable dishes.  Marinate fish or vegetables using olive oil, lemon juice, garlic, and fresh herbs. Meal planning  Plan to eat 1 vegetarian meal one day each week. Try to work up to 2 vegetarian meals, if possible.  Eat seafood 2 or more times a week.  Have healthy snacks readily available, such as: ? Vegetable sticks with hummus. ? [place] yogurt. ?  Fruit and nut trail mix.  Eat balanced meals throughout the week. This includes: ? Fruit: 2-3 servings a day ? Vegetables: 4-5 servings a day ? Low-fat dairy: 2 servings a day ? Fish, poultry, or lean meat: 1 serving a day ? Beans and legumes: 2 or more servings a week ? Nuts and seeds: 1-2 servings a day ? Whole grains: 6-8 servings a day ? Extra-virgin olive oil: 3-4 servings a day  Limit red meat and sweets to only a few servings a month   What are my food  choices?  Mediterranean diet ? Recommended  Grains: Whole-grain pasta. Brown rice. Bulgar wheat. Polenta. Couscous. Whole-wheat bread. [place].  Vegetables: Artichokes. Beets. Broccoli. Cabbage. Carrots. Eggplant. Green beans. Chard. Kale. Spinach. Onions. Leeks. Peas. Squash. Tomatoes. Peppers. Radishes.  Fruits: Apples. Apricots. Avocado. Berries. Bananas. Cherries. Dates. Figs. Grapes. Lemons. Melon. Oranges. Peaches. Plums. Pomegranate.  Meats and other protein foods: Beans. Almonds. Sunflower seeds. Pine nuts. Peanuts. [place]. Salmon. Scallops. Shrimp. [place]. Tilapia. Clams. Oysters. Eggs.  Dairy: Low-fat milk. Cheese. Greek yogurt.  Beverages: Water. Red wine. Herbal tea.  Fats and oils: Extra virgin olive oil. Avocado oil. Grape seed oil.  Sweets and desserts: [place] yogurt with honey. Baked apples. Poached pears. Trail mix.  Seasoning and other foods: Basil. Cilantro. Coriander. Cumin. Mint. Parsley. Sage. Rosemary. Tarragon. Garlic. Oregano. Thyme. Pepper. Balsalmic vinegar. Tahini. Hummus. Tomato sauce. Olives. Mushrooms. ? Limit these  Grains: Prepackaged pasta or rice dishes. Prepackaged cereal with added sugar.  Vegetables: Deep fried potatoes (french fries).  Fruits: Fruit canned in syrup.  Meats and other protein foods: Beef. Pork. Lamb. Poultry with skin. Hot dogs. [place] Salines.  Dairy: Ice cream. Sour cream. Whole milk.  Beverages: Juice. Sugar-sweetened soft drinks. Beer. Liquor and spirits.  Fats and oils: Butter. Canola oil. Vegetable oil. Beef fat (tallow). Lard.  Sweets and desserts: Cookies. Cakes. Pies. Candy.  Seasoning and other foods: Mayonnaise. Premade sauces and marinades. The items listed may not be a complete list. Talk with your dietitian about what dietary choices are right for you. Summary  The Mediterranean diet includes both food and lifestyle choices.  Eat a variety of fresh fruits and vegetables, beans, nuts, seeds, and whole  grains.  Limit the amount of red meat and sweets that you eat.  Talk with your health care provider about whether it is safe for you to drink red wine in moderation. This means 1 glass a day for nonpregnant women and 2 glasses a day for men. A glass of wine equals 5 oz (150 mL). This information is not intended to replace advice given to you by your health care provider. Make sure you discuss any questions you have with your health care provider. Document Revised: 08/16/2016 Document Reviewed: 08/09/2016 Elsevier Patient Education  [place].

## 2021-04-19 ENCOUNTER — Ambulatory Visit: Payer: Medicare PPO | Attending: Physical Medicine & Rehabilitation

## 2021-04-19 DIAGNOSIS — R2689 Other abnormalities of gait and mobility: Secondary | ICD-10-CM | POA: Insufficient documentation

## 2021-04-19 DIAGNOSIS — I639 Cerebral infarction, unspecified: Secondary | ICD-10-CM | POA: Insufficient documentation

## 2021-04-19 DIAGNOSIS — I6381 Other cerebral infarction due to occlusion or stenosis of small artery: Secondary | ICD-10-CM

## 2021-04-19 DIAGNOSIS — M6281 Muscle weakness (generalized): Secondary | ICD-10-CM | POA: Insufficient documentation

## 2021-04-19 NOTE — Therapy (Signed)
[facility] [address] [place], [place], [address] Phone: [phone]   Fax:  [phone]  Physical Therapy Evaluation  Patient Details  Name: [name] [name] MRN: [id number] Date of Birth: October 21, 1941 Referring Provider (PT): Dr. Alysia Penna   Encounter Date: 04/19/2021   PT End of Session - 04/19/21 1048    Visit Number 1    Number of Visits 7    Date for PT Re-Evaluation 05/17/21    Authorization Type [employer]    Authorization Time Period Plan for 4 visits and then assess for discharge    PT Start Time 0940    PT Stop Time 1030    PT Time Calculation (min) 50 min    Equipment Utilized During Treatment Gait belt    Activity Tolerance Patient tolerated treatment well    Behavior During Therapy [facility] for tasks assessed/performed           Past Medical History:  Diagnosis Date  . Basal cell carcinoma 11/09/2019   nod & infil-behind right ear-cx3 &exc  . Basal cell carcinoma 03/21/2020   Residual BCC with peripheral margin involved - ST recommends MOHs  . Carotid artery occlusion   . Coronary artery disease    s/p CABG 2005; sees Dr Johnsie Cancel yearly  . Gynecomastia   . Hypercholesterolemia   . Hypertension   . Neurodermatitis   . Overweight(278.02)   . Personal history of colonic polyps 10/08/2006   tubular adenomas  . Renal insufficiency   . Transient ischemic attack 2010   "lasted ~ 5 seconds"  . Vitamin D deficiency     Past Surgical History:  Procedure Laterality Date  . CARDIAC CATHETERIZATION  02/2004   "tried to stent; couldn't"  . CAROTID ENDARTERECTOMY Right 08/26/2015  . CORONARY ANGIOPLASTY    . CORONARY ARTERY BYPASS GRAFT  Feb. 2005   4 vessel  . ENDARTERECTOMY Right 08/26/2015   Procedure: Right Carotid ENDARTERECTOMY with Patch Angioplasty ;  Surgeon: Rosetta Posner, MD;  Location: [facility];  Service: Vascular;  Laterality: Right;  . 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 Dessie Coma   . KELOID EXCISION    . PILONIDAL CYST EXCISION  1989    There were no vitals filed for this visit.    Subjective Assessment - 04/19/21 0937    Subjective CVA happended 4/4. Woke up at 2 am and felt numbness in left lip, L hand and L leg. Sat down but didn't go away. HE woke up wife. By 2 hours, all the symptoms went away. Stll went to ED, they found he had stroke. HIs symptoms came back later that morning. He got out a week ago today from inpatient rehab. He didn't have any home therapy.    Patient is accompained by: Family member   wife   Limitations Walking    Patient Stated Goals improve balane    Currently in Pain? No/denies              [facility] PT Assessment - 04/19/21 1006      Assessment   Medical Diagnosis CVA    Referring Provider (PT) Dr. Alysia Penna    Onset Date/Surgical Date 04/03/21    Prior Therapy inpatient rehab      Precautions   Precautions Fall      Restrictions   Weight Bearing Restrictions No      Balance Screen   Has the patient fallen in the past 6 months No  Home Environment   Living Environment Private residence    Living Arrangements Spouse/significant other    Available Help at Discharge Family    Type of [place] to enter    Entrance Stairs-Number of Steps 2    [place] One level    [place] - 2 wheels;Cane - single point      Prior Function   Level of Independence Independent      Cognition   Overall Cognitive Status Within Functional Limits for tasks assessed      Observation/Other Assessments   Focus on Therapeutic Outcomes (FOTO)  74 (predicted 85)      Posture/Postural Control   Posture/Postural Control Postural limitations    Postural Limitations Forward head;Rounded Shoulders;Flexed trunk      Ambulation/Gait   Ambulation/Gait Yes    Ambulation/Gait Assistance 7: Independent    Ambulation Distance (Feet) 400 Feet    Assistive device None     Gait Pattern Poor foot clearance - left;Poor foot clearance - right;Trunk flexed;Decreased step length - right;Decreased step length - left;Decreased hip/knee flexion - right;Decreased hip/knee flexion - left      Standardized Balance Assessment   Standardized Balance Assessment 10 meter walk test;Five Times Sit to Stand    Five times sit to stand comments  21    10 Meter Walk 0.77 m/s no AD      Functional Gait  Assessment   Gait assessed  Yes    Gait Level Surface Walks 20 ft in less than 7 sec but greater than 5.5 sec, uses assistive device, slower speed, mild gait deviations, or deviates 6-10 in outside of the 12 in walkway width.    Change in Gait Speed Able to smoothly change walking speed without loss of balance or gait deviation. Deviate no more than 6 in outside of the 12 in walkway width.    Gait with Horizontal Head Turns Performs head turns smoothly with no change in gait. Deviates no more than 6 in outside 12 in walkway width    Gait with Vertical Head Turns Performs head turns with no change in gait. Deviates no more than 6 in outside 12 in walkway width.    Gait and Pivot Turn Pivot turns safely within 3 sec and stops quickly with no loss of balance.    Step Over Obstacle Is able to step over 2 stacked shoe boxes taped together (9 in total height) without changing gait speed. No evidence of imbalance.    Gait with Narrow Base of Support Ambulates 4-7 steps.    Gait with Eyes Closed Walks 20 ft, no assistive devices, good speed, no evidence of imbalance, normal gait pattern, deviates no more than 6 in outside 12 in walkway width. Ambulates 20 ft in less than 7 sec.    Ambulating Backwards Walks 20 ft, no assistive devices, good speed, no evidence for imbalance, normal gait    Steps Alternating feet, must use rail.    Total Score 26    FGA comment: 26/30- low fall risk             Pt educated on using cane for about 1 month when he walks to improve stability Pt educated  on compliance with walking program: starting with 10 min of walk and then adding 2 min to his total time every week for a week. Issued HEP and reviewed it with patient: - sit to stand: 10x -  Heel raises: 3 x 10 bil         Objective measurements completed on examination: See above findings.                 PT Short Term Goals - 04/19/21 1043      PT SHORT TERM GOAL #1   Title STG=LTG             PT Long Term Goals - 04/19/21 1043      PT LONG TERM GOAL #1   Title Pt will demo 5x sit to stand in 16 sec or less to improve functional strength    Baseline 21 sec (04/19/21)    Time 6    Period Weeks    Status New    Target Date 05/31/21      PT LONG TERM GOAL #2   Title Pt will be I and compliant with walking program to independently manage symptoms at home.    Baseline Walking progrm issued on 04/19/21    Time 6    Period Weeks    Status New    Target Date 05/31/21      PT LONG TERM GOAL #3   Title Pt will demo 85 points on LE CVA on FOTO to improve overall function    Baseline 74 (4/20/2)    Time 6    Period Weeks    Status New    Target Date 05/31/21      PT LONG TERM GOAL #4   Title Pt will demo 0.51m/s walking speed to improve fall risk and community ambulation    Baseline 0.37m/s (04/19/21) no AD;    Time 6    Period Weeks    Status New    Target Date 05/31/21                  Plan - 04/19/21 1022    Clinical Impression Statement Patient is a 80 y.o. male who was seen today for physical therapy evaluation and treatment for gait and mobility disorder after recent stroke. Patient scored 26/30 on functional gait index indicating low fall risk. patient scored 21 seconds on 5x sit to stand indicating decreased functional strength. patient has gait abnormalities and decreased walking endurance. Patient will benefit from skilled PT to address these impairments and improve overall function.    Personal Factors and Comorbidities Age;Time since  onset of injury/illness/exacerbation;Past/Current Experience    Examination-Activity Limitations Lift;Stairs;Squat    Examination-Participation Restrictions Church;Community Activity;Driving;Shop;Yard Work;Cleaning    Stability/Clinical Decision Making Stable/Uncomplicated    Clinical Decision Making Low    Rehab Potential Good    PT Frequency 1x / week    PT Duration 6 weeks    PT Treatment/Interventions ADLs/Self Care Home Management;Gait training;Stair training;Functional mobility training;Therapeutic activities;Therapeutic exercise;Balance training;Neuromuscular re-education;Patient/family education;Orthotic Fit/Training;Manual techniques;Passive range of motion;Energy conservation;Joint Manipulations    PT Next Visit Plan Continue to work on walking speed, sit to stand tranfers. Assess compliance with walking program and HEP    PT Home Exercise Plan Access Code [email or web address]    Consulted and Agree with Plan of Care Patient;Family member/caregiver    Family Member Consulted Wife           Patient will benefit from skilled therapeutic intervention in order to improve the following deficits and impairments:  Abnormal gait,Decreased activity tolerance,Decreased balance,Decreased endurance,Decreased mobility,Difficulty walking,Decreased strength,Postural dysfunction  Visit Diagnosis: Right thalamic infarction [facility])  Other abnormalities of gait and mobility  Muscle weakness (generalized)     Problem  List Patient Active Problem List   Diagnosis Date Noted  . 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 TIA (transient ischemic attack) 04/03/2021  . Lower extremity edema 08/13/2019  . Unsteady gait 06/09/2019  . Preventative health care 06/09/2019  . Hyperlipidemia 01/30/2016  . Carotid stenosis 08/26/2015  . 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 Carotid artery disease ([place]) 07/01/2012  . Special screening for malignant neoplasm of prostate 05/08/2011  . Vitamin D deficiency 11/20/2009  .  Transient cerebral ischemia 06/30/2009  . CEREBROVASCULAR DISEASE 06/30/2009  . COLONIC POLYPS 01/10/2008  . Essential hypertension 01/10/2008  . Coronary atherosclerosis 01/10/2008  . 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 GYNECOMASTIA, UNILATERAL 01/10/2008  . 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 SHOULDER PAIN 01/10/2008  . Type 2 diabetes mellitus ([place]) 01/10/2008    [name], PT 04/19/2021, 10:54 AM  [facility] [address] [place], [place], [address] Phone: [phone]   Fax:  [phone]  Name: [name] [name] MRN: [id number] Date of Birth: 06-24-1941

## 2021-04-24 ENCOUNTER — Encounter: Payer: Self-pay | Admitting: Registered Nurse

## 2021-04-24 ENCOUNTER — Other Ambulatory Visit: Payer: Self-pay

## 2021-04-24 ENCOUNTER — Encounter: Payer: Medicare PPO | Attending: Registered Nurse | Admitting: Registered Nurse

## 2021-04-24 VITALS — BP 145/89 | HR 60 | Temp 98.1°F | Ht 72.0 in | Wt 224.4 lb

## 2021-04-24 DIAGNOSIS — E7849 Other hyperlipidemia: Secondary | ICD-10-CM | POA: Diagnosis not present

## 2021-04-24 DIAGNOSIS — I639 Cerebral infarction, unspecified: Secondary | ICD-10-CM | POA: Insufficient documentation

## 2021-04-24 DIAGNOSIS — I10 Essential (primary) hypertension: Secondary | ICD-10-CM | POA: Diagnosis not present

## 2021-04-24 DIAGNOSIS — I6381 Other cerebral infarction due to occlusion or stenosis of small artery: Secondary | ICD-10-CM

## 2021-04-24 NOTE — Progress Notes (Signed)
Subjective:    Patient ID: [name] [name], male    DOB: 06-11-1941, 80 y.o.   MRN: [id number]  HPI: [name] [name] is a 80 y.o. male who is here for Transitional Care Visit for follow up of his Right Thalamic Infarction, Essential Hypertension, Hyperlipidemia and Type 2 Diabetes Mellitus. He presented to [facility] on 04/03/2021 with complaints of left sided weakness. Neurology Consulted.  CT Head WO Contrast:  IMPRESSION: 1. No acute intracranial abnormality identified. 2. Advanced chronic small vessel disease. Mild progression since 2010. CTA: Head Neck W WO  IMPRESSION: CTA Head:  1. No emergent large vessel occlusion. 2. 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  CTA Neck: 1. Approximately 70-80% stenosis of the proximal internal carotid artery just distal to the carotid bifurcation. 2. Approximately 50% stenosis of the right vertebral artery origin.  MR Brain WO Contrast:  IMPRESSION: 1. Suspected small acute right thalamic infarct. 2. 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  Mr. Klippel was maintained on aspirin and Plavix for CVA prophylaxis x 3 weeks then Plavix alone. Mr. Hoh will follow up with Dr Tomi Likens.  Mr. Mcclenton was admitted to inpatient rehabilitation on 04/06/2021 and discharged home on 04/12/2021. He is receiving outpatient therapy at [facility]. He denies any pain, he rates his pain 0. Also reports he has a good appetite.   Mrs. Pavlik in the room all questions answered.  Mr. Cates wants to resume driving, this will be discussed with Dr Letta Pate. This provider will give him a call. He verbalizes understanding.   Pain Inventory Average Pain 0 Pain Right Now 0 My pain is no pain  LOCATION OF PAIN  na  BOWEL Number of stools per week: 4-5 Oral laxative use No  Type of laxative na Enema or suppository use No  History of colostomy No   Incontinent No   BLADDER Normal In and out cath, frequency na Able to self cath na Bladder incontinence No  Frequent urination No  Leakage with coughing No  Difficulty starting stream No  Incomplete bladder emptying No    Mobility walk without assistance how many minutes can you walk? unlimited ability to climb steps?  yes do you drive?  yes  Function retired  Neuro/Psych No problems in this area  Prior Studies TC appt  Physicians involved in your care TC appt   Family History  Problem Relation Age of Onset  . Heart disease Mother        Before age 28  . Diabetes Mother   . Kidney disease Mother   . Heart attack Mother 23  . Lung cancer Father 75  . Diabetes Brother   . Heart disease Brother   . Heart disease Brother   . Arthritis Brother   . Diabetes Sister   . Fibromyalgia Sister   . Lung cancer Paternal Uncle        questionable as to if it was lung ca  . Healthy Daughter   . Colon cancer Neg Hx   . Stroke Neg Hx    Social History   Socioeconomic History  . Marital status: Married    Spouse name: [name]  . Number of children: 1  . Years of education: Not on file  . Highest education level: Some college, no degree  Occupational History  . Occupation: retired  Tobacco Use  . Smoking status: Never Smoker  . Smokeless tobacco: Current User  Types: Snuff  . Tobacco comment: uses dip 3-4 times per week  Vaping Use  . Vaping Use: Never used  Substance and Sexual Activity  . Alcohol use: No    Alcohol/week: 0.0 standard drinks  . Drug use: No  . Sexual activity: Yes  Other Topics Concern  . Not on file  Social History Narrative   Retired.   Once worked for the [employer].   Married.   Enjoys reading, spending time with family.    Left handed   Drinks caffeine   One story home   Social Determinants of Health   Financial Resource Strain: Low Risk   . Difficulty of Paying Living Expenses: Not hard at all  Food Insecurity: No Food Insecurity   . Worried About Charity fundraiser in the Last Year: Never true  . Ran Out of Food in the Last Year: Never true  Transportation Needs: No Transportation Needs  . Lack of Transportation (Medical): No  . Lack of Transportation (Non-Medical): No  Physical Activity: Insufficiently Active  . Days of Exercise per Week: 1 day  . Minutes of Exercise per Session: 20 min  Stress: No Stress Concern Present  . Feeling of Stress : Not at all  Social Connections: Moderately Integrated  . Frequency of Communication with Friends and Family: More than three times a week  . Frequency of Social Gatherings with Friends and Family: More than three times a week  . Attends Religious Services: More than 4 times per year  . Active Member of Clubs or Organizations: No  . Attends Archivist Meetings: Never  . Marital Status: Married   Past Surgical History:  Procedure Laterality Date  . CARDIAC CATHETERIZATION  02/2004   "tried to stent; couldn't"  . CAROTID ENDARTERECTOMY Right 08/26/2015  . CORONARY ANGIOPLASTY    . CORONARY ARTERY BYPASS GRAFT  Feb. 2005   4 vessel  . ENDARTERECTOMY Right 08/26/2015   Procedure: Right Carotid ENDARTERECTOMY with Patch Angioplasty ;  Surgeon: Rosetta Posner, MD;  Location: [place];  Service: Vascular;  Laterality: Right;  . 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 Dessie Coma  . KELOID EXCISION    . PILONIDAL CYST EXCISION  1989   Past Medical History:  Diagnosis Date  . Basal cell carcinoma 11/09/2019   nod & infil-behind right ear-cx3 &exc  . Basal cell carcinoma 03/21/2020   Residual BCC with peripheral margin involved - ST recommends MOHs  . Carotid artery occlusion   . Coronary artery disease    s/p CABG 2005; sees Dr Johnsie Cancel yearly  . Gynecomastia   . Hypercholesterolemia   . Hypertension   . Neurodermatitis   . Overweight(278.02)   . Personal history of colonic polyps 10/08/2006   tubular adenomas  . Renal insufficiency   . Transient ischemic  attack 2010   "lasted ~ 5 seconds"  . Vitamin D deficiency    BP (!) 145/89   Pulse 60   Temp 98.1 F (36.7 C)   Ht 6' (1.829 m)   Wt 224 lb 6.4 oz (101.8 kg)   SpO2 95%   BMI 30.43 kg/m   Opioid Risk Score:   Fall Risk Score:  `1  Depression screen PHQ 2/9  Depression screen [facility] 2/9 04/24/2021 03/10/2021 08/01/2016 07/28/2014 07/02/2013  Decreased Interest 0 0 0 0 0  Down, Depressed, Hopeless 0 0 0 0 0  PHQ - 2 Score 0 0 0 0 0  Altered sleeping - 1 - - -  Tired, decreased energy - 1 - - -  Change in appetite - 0 - - -  Feeling bad or failure about yourself  - 0 - - -  Trouble concentrating - 0 - - -  Moving slowly or fidgety/restless - 0 - - -  Suicidal thoughts - 0 - - -  PHQ-9 Score - 2 - - -  Difficult doing work/chores - Not difficult at all - - -    Review of Systems     Objective:   Physical Exam Vitals and nursing note reviewed.  Constitutional:      Appearance: Normal appearance.  Cardiovascular:     Rate and Rhythm: Normal rate and regular rhythm.     Pulses: Normal pulses.     Heart sounds: Normal heart sounds.  Pulmonary:     Effort: Pulmonary effort is normal.     Breath sounds: Normal breath sounds.  Musculoskeletal:     Cervical back: Neck supple.     Comments: Normal Muscle Bulk and Muscle Testing Reveals:  Upper Extremities: Full ROM and Muscle Strength 5/5  Lower Extremities: Full ROM and Muscle Strength 5/5 Arises from Table with ease using cane for support Narrow Based  Gait   Skin:    General: Skin is warm and dry.  Neurological:     Mental Status: He is alert and oriented to person, place, and time.  Psychiatric:        Mood and Affect: Mood normal.        Behavior: Behavior normal.           Assessment & Plan:  1. Right Thalamic Infarction: Dr Loretta Plume following. Continue Outpatient Therapy at [facility]. Continue to Monitor.  2. Essential Hypertension: Continue current medication regimen. Continue to Monitor.  3.  Hyperlipidemia: Continue current medication regimen. PCP following. Continue to Monitor.  4.Type 2 Diabetes Mellitus.PCP Following. Continue to onitor.   F/U with Dr Letta Pate in 4-6 weeks

## 2021-04-25 ENCOUNTER — Telehealth: Payer: Self-pay | Admitting: Registered Nurse

## 2021-04-25 NOTE — Telephone Encounter (Signed)
Would like to know if ok to be driving.

## 2021-04-25 NOTE — Telephone Encounter (Signed)
This provider spoke with Dr Letta Pate regarding Mr. Carlile request to drive. He denies any visual or hearing difficulties. He is A&O X3. RUE/ LUE  Muscle strength 5/5.RLE/LLE  Muscle Strength 5/5. Driving Instructions reviewed with Mr. And Mrs Mulkern, they verbalize understanding.  Driving Instructions:  Try  Each driving Routine on a  Daily basis.  Mrs. Schlink will drive with Mr. Fellers three times in an open lot, if no difficulties. She will drive with Mr. Samad in their neighborhood for three times, if no difficulties. Mrs Hantz will drive with Mr Osorto in regular conditions for three times. If no difficulties with the above Mr. Pabst will be able to drive. Mr. And Mrs. Cudd verbalizes understanding. They were instructed to call office with any questions or concerns. They verbalize understanding. Dr Letta Pate in agreement with the above.

## 2021-04-26 ENCOUNTER — Other Ambulatory Visit: Payer: Self-pay

## 2021-04-26 ENCOUNTER — Ambulatory Visit: Payer: Medicare PPO

## 2021-04-26 DIAGNOSIS — M6281 Muscle weakness (generalized): Secondary | ICD-10-CM

## 2021-04-26 DIAGNOSIS — R2689 Other abnormalities of gait and mobility: Secondary | ICD-10-CM

## 2021-04-26 DIAGNOSIS — I639 Cerebral infarction, unspecified: Secondary | ICD-10-CM | POA: Diagnosis not present

## 2021-04-26 NOTE — Therapy (Signed)
[name] [name] [address] [place], [place], [address] Phone: [phone]   Fax:  [phone]  Physical Therapy Treatment  Patient Details  Name: [name] [name] MRN: [id number] Date of Birth: Jul 10, 1941 Referring Provider (PT): Dr. Alysia Penna   Encounter Date: 04/26/2021   PT End of Session - 04/26/21 0932    Visit Number 2    Number of Visits 7    Date for PT Re-Evaluation 05/17/21    Authorization Type [employer]    Authorization Time Period Plan for 4 visits and then assess for discharge    PT Start Time 0930    PT Stop Time 1015    PT Time Calculation (min) 45 min    Equipment Utilized During Treatment Gait belt    Activity Tolerance Patient tolerated treatment well    Behavior During Therapy [facility] for tasks assessed/performed           Past Medical History:  Diagnosis Date  . Basal cell carcinoma 11/09/2019   nod & infil-behind right ear-cx3 &exc  . Basal cell carcinoma 03/21/2020   Residual BCC with peripheral margin involved - ST recommends MOHs  . Carotid artery occlusion   . Coronary artery disease    s/p CABG 2005; sees Dr Johnsie Cancel yearly  . Gynecomastia   . Hypercholesterolemia   . Hypertension   . Neurodermatitis   . Overweight(278.02)   . Personal history of colonic polyps 10/08/2006   tubular adenomas  . Renal insufficiency   . Transient ischemic attack 2010   "lasted ~ 5 seconds"  . Vitamin D deficiency     Past Surgical History:  Procedure Laterality Date  . CARDIAC CATHETERIZATION  02/2004   "tried to stent; couldn't"  . CAROTID ENDARTERECTOMY Right 08/26/2015  . CORONARY ANGIOPLASTY    . CORONARY ARTERY BYPASS GRAFT  Feb. 2005   4 vessel  . ENDARTERECTOMY Right 08/26/2015   Procedure: Right Carotid ENDARTERECTOMY with Patch Angioplasty ;  Surgeon: Rosetta Posner, MD;  Location: [place];  Service: Vascular;  Laterality: Right;  . 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 Dessie Coma   . KELOID EXCISION    . PILONIDAL CYST EXCISION  1989    There were no vitals filed for this visit.   Subjective Assessment - 04/26/21 0932    Subjective I get tired walking 10 min. I think my dining table chair is lower    Patient is accompained by: Family member   wife   Limitations Walking    Patient Stated Goals improve balane    Currently in Pain? No/denies             Therex: Sit to stand: 16" box: 10x with min A Walking fwd and bwd: heel to toe and toe to heel cues: cues to lift foot in ankle dorsiflexion towards late stance phase when walking bwd: 10 x 20 feet Walking with 15 lbs KB in R or L hand: 440 feet.                          PT Short Term Goals - 04/19/21 1043      PT SHORT TERM GOAL #1   Title STG=LTG             PT Long Term Goals - 04/19/21 1043      PT LONG TERM GOAL #1   Title Pt will demo 5x sit to stand in 16 sec  or less to improve functional strength    Baseline 21 sec (04/19/21)    Time 6    Period Weeks    Status New    Target Date 05/31/21      PT LONG TERM GOAL #2   Title Pt will be I and compliant with walking program to independently manage symptoms at home.    Baseline Walking progrm issued on 04/19/21    Time 6    Period Weeks    Status New    Target Date 05/31/21      PT LONG TERM GOAL #3   Title Pt will demo 85 points on LE CVA on FOTO to improve overall function    Baseline 74 (4/20/2)    Time 6    Period Weeks    Status New    Target Date 05/31/21      PT LONG TERM GOAL #4   Title Pt will demo 0.47m/s walking speed to improve fall risk and community ambulation    Baseline 0.62m/s (04/19/21) no AD;    Time 6    Period Weeks    Status New    Target Date 05/31/21                  Patient will benefit from skilled therapeutic intervention in order to improve the following deficits and impairments:     Visit Diagnosis: Muscle weakness (generalized)  Other abnormalities of gait and  mobility     Problem List Patient Active Problem List   Diagnosis Date Noted  . 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 TIA (transient ischemic attack) 04/03/2021  . Lower extremity edema 08/13/2019  . Unsteady gait 06/09/2019  . Preventative health care 06/09/2019  . Hyperlipidemia 01/30/2016  . Carotid stenosis 08/26/2015  . 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 Carotid artery disease ([place]) 07/01/2012  . Special screening for malignant neoplasm of prostate 05/08/2011  . Vitamin D deficiency 11/20/2009  . Transient cerebral ischemia 06/30/2009  . CEREBROVASCULAR DISEASE 06/30/2009  . COLONIC POLYPS 01/10/2008  . Essential hypertension 01/10/2008  . Coronary atherosclerosis 01/10/2008  . 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 GYNECOMASTIA, UNILATERAL 01/10/2008  . 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 SHOULDER PAIN 01/10/2008  . Type 2 diabetes mellitus ([place]) 01/10/2008    [name] [name] 04/26/2021, 10:25 AM  [facility] [address] [place], [place], [address] Phone: [phone]   Fax:  [phone]  Name: [name] [name] MRN: [id number] Date of Birth: 02/18/1941

## 2021-05-01 ENCOUNTER — Encounter: Payer: Medicare PPO | Admitting: Vascular Surgery

## 2021-05-03 ENCOUNTER — Ambulatory Visit (HOSPITAL_COMMUNITY)
Admission: RE | Admit: 2021-05-03 | Discharge: 2021-05-03 | Disposition: A | Discharge status: Home / Self Care | Payer: Medicare PPO | Source: Ambulatory Visit | Attending: Cardiovascular Disease | Admitting: Cardiovascular Disease

## 2021-05-03 ENCOUNTER — Telehealth: Payer: Self-pay

## 2021-05-03 ENCOUNTER — Ambulatory Visit: Payer: Medicare PPO | Attending: Physical Medicine & Rehabilitation

## 2021-05-03 ENCOUNTER — Other Ambulatory Visit (HOSPITAL_COMMUNITY): Payer: Self-pay | Admitting: Cardiovascular Disease

## 2021-05-03 ENCOUNTER — Other Ambulatory Visit: Payer: Self-pay

## 2021-05-03 DIAGNOSIS — R2689 Other abnormalities of gait and mobility: Secondary | ICD-10-CM | POA: Diagnosis not present

## 2021-05-03 DIAGNOSIS — M6281 Muscle weakness (generalized): Secondary | ICD-10-CM | POA: Insufficient documentation

## 2021-05-03 DIAGNOSIS — Z9889 Other specified postprocedural states: Secondary | ICD-10-CM

## 2021-05-03 DIAGNOSIS — I6381 Other cerebral infarction due to occlusion or stenosis of small artery: Secondary | ICD-10-CM

## 2021-05-03 DIAGNOSIS — I6521 Occlusion and stenosis of right carotid artery: Secondary | ICD-10-CM

## 2021-05-03 DIAGNOSIS — I639 Cerebral infarction, unspecified: Secondary | ICD-10-CM | POA: Diagnosis not present

## 2021-05-03 DIAGNOSIS — I6523 Occlusion and stenosis of bilateral carotid arteries: Secondary | ICD-10-CM

## 2021-05-03 NOTE — Telephone Encounter (Signed)
-----   Message from Josue Hector, MD sent at 05/03/2021 10:13 AM EDT ----- 53-61% LICA stenosis.  F/U duplex in one year Right CEA ok

## 2021-05-03 NOTE — Therapy (Signed)
[facility] [address] [place], [place], [address] Phone: [phone]   Fax:  [phone]  Physical Therapy Treatment  Patient Details  Name: [name] [name] MRN: [id number] Date of Birth: January 27, 1941 Referring Provider (PT): Dr. Alysia Penna   Encounter Date: 05/03/2021   PT End of Session - 05/03/21 1106    Visit Number 3    Number of Visits 7    Date for PT Re-Evaluation 05/17/21    Authorization Type [employer]    Authorization Time Period Plan for 4 visits and then assess for discharge    PT Start Time 1105    PT Stop Time 1145    PT Time Calculation (min) 40 min    Equipment Utilized During Treatment Gait belt    Activity Tolerance Patient tolerated treatment well    Behavior During Therapy [facility] for tasks assessed/performed           Past Medical History:  Diagnosis Date  . Basal cell carcinoma 11/09/2019   nod & infil-behind right ear-cx3 &exc  . Basal cell carcinoma 03/21/2020   Residual BCC with peripheral margin involved - ST recommends MOHs  . Carotid artery occlusion   . Coronary artery disease    s/p CABG 2005; sees Dr Johnsie Cancel yearly  . Gynecomastia   . Hypercholesterolemia   . Hypertension   . Neurodermatitis   . Overweight(278.02)   . Personal history of colonic polyps 10/08/2006   tubular adenomas  . Renal insufficiency   . Transient ischemic attack 2010   "lasted ~ 5 seconds"  . Vitamin D deficiency     Past Surgical History:  Procedure Laterality Date  . CARDIAC CATHETERIZATION  02/2004   "tried to stent; couldn't"  . CAROTID ENDARTERECTOMY Right 08/26/2015  . CORONARY ANGIOPLASTY    . CORONARY ARTERY BYPASS GRAFT  Feb. 2005   4 vessel  . ENDARTERECTOMY Right 08/26/2015   Procedure: Right Carotid ENDARTERECTOMY with Patch Angioplasty ;  Surgeon: Rosetta Posner, MD;  Location: [place];  Service: Vascular;  Laterality: Right;  . 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 Dessie Coma   . KELOID EXCISION    . PILONIDAL CYST EXCISION  1989    There were no vitals filed for this visit.   Subjective Assessment - 05/03/21 1107    Subjective I am walking up to 12 min    Patient is accompained by: Family member   wife   Limitations Walking    Patient Stated Goals improve balane    Currently in Pain? No/denies                Heel raises: 3 x 10 Toe raises: 2 x 10 with buttocks against countertop Walking up and down the ramp fwd and bwd: facing incline and decline: 5x each way Resisted walking: black sport cord: fwd and bwd, facing pulley and facing away from pulley: 10x each directions Sit to stand: 20lbs in hand: 3 x 5 Standing deadlift: 30lbs KB from floor, 2 x 5                       PT Short Term Goals - 04/19/21 1043      PT SHORT TERM GOAL #1   Title STG=LTG             PT Long Term Goals - 04/19/21 1043      PT LONG TERM GOAL #1   Title Pt will demo  5x sit to stand in 16 sec or less to improve functional strength    Baseline 21 sec (04/19/21)    Time 6    Period Weeks    Status New    Target Date 05/31/21      PT LONG TERM GOAL #2   Title Pt will be I and compliant with walking program to independently manage symptoms at home.    Baseline Walking progrm issued on 04/19/21    Time 6    Period Weeks    Status New    Target Date 05/31/21      PT LONG TERM GOAL #3   Title Pt will demo 85 points on LE CVA on FOTO to improve overall function    Baseline 74 (4/20/2)    Time 6    Period Weeks    Status New    Target Date 05/31/21      PT LONG TERM GOAL #4   Title Pt will demo 0.56m/s walking speed to improve fall risk and community ambulation    Baseline 0.85m/s (04/19/21) no AD;    Time 6    Period Weeks    Status New    Target Date 05/31/21                  Patient will benefit from skilled therapeutic intervention in order to improve the following deficits and impairments:     Visit Diagnosis: Other  abnormalities of gait and mobility  Right thalamic infarction [facility])  Muscle weakness (generalized)     Problem List Patient Active Problem List   Diagnosis Date Noted  . 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 TIA (transient ischemic attack) 04/03/2021  . Lower extremity edema 08/13/2019  . Unsteady gait 06/09/2019  . Preventative health care 06/09/2019  . Hyperlipidemia 01/30/2016  . Carotid stenosis 08/26/2015  . 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 Carotid artery disease ([place]) 07/01/2012  . Special screening for malignant neoplasm of prostate 05/08/2011  . Vitamin D deficiency 11/20/2009  . Transient cerebral ischemia 06/30/2009  . CEREBROVASCULAR DISEASE 06/30/2009  . COLONIC POLYPS 01/10/2008  . Essential hypertension 01/10/2008  . Coronary atherosclerosis 01/10/2008  . 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 GYNECOMASTIA, UNILATERAL 01/10/2008  . 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 SHOULDER PAIN 01/10/2008  . Type 2 diabetes mellitus ([place]) 01/10/2008    [name], PT 05/03/2021, 11:38 AM  [facility] [address] [place], [place], [address] Phone: [phone]   Fax:  [phone]  Name: [name] [name] MRN: [id number] Date of Birth: July 21, 1941

## 2021-05-03 NOTE — Telephone Encounter (Signed)
Left message for patient with results (ok per DPR). Advised to call back with any questions. Orders placed for repeat carotids.

## 2021-05-05 ENCOUNTER — Other Ambulatory Visit: Payer: Self-pay

## 2021-05-05 ENCOUNTER — Ambulatory Visit: Payer: Medicare PPO | Admitting: Internal Medicine

## 2021-05-05 ENCOUNTER — Encounter: Payer: Self-pay | Admitting: Internal Medicine

## 2021-05-05 VITALS — BP 136/56 | HR 65 | Ht 73.0 in | Wt 224.6 lb

## 2021-05-05 DIAGNOSIS — I25810 Atherosclerosis of coronary artery bypass graft(s) without angina pectoris: Secondary | ICD-10-CM | POA: Diagnosis not present

## 2021-05-05 NOTE — Patient Instructions (Addendum)
Medication Instructions:  Your physician recommends that you continue on your current medications as directed. Please refer to the Current Medication list given to you today.  Labwork: None ordered.  Testing/Procedures: None ordered.  Follow-Up:  Implantable loop recorder.  Implant code is [id number]- this has already been approved by your insurance.  You are asking what your out of pocket cost will be for the procedure.  Monthly monitoring codes- [id number] + [id number]  Please let me know if you would like to schedule this implant.  Your current authorization is only good through June 29, 2021.

## 2021-05-05 NOTE — Progress Notes (Signed)
HPI [name] [name] is referred by Dr. Johnsie Cancel to consider insertion of an ILR. He is a pleasant 80 yo man with a cryptogenic stroke, carotid vascular disease, s/p right CEA, CAD s/p CABG, and prior TIA's. He has been placed on plavix. He has not had a diagnosis of atrial fib.  Allergies  Allergen Reactions  . Niacin Other (See Comments)    REACTION: intol to NIACIN w/ headaches REACTION: intol to NIACIN w/ headaches     Current Outpatient Medications  Medication Sig Dispense Refill  . acetaminophen (TYLENOL) 325 MG tablet Take 2 tablets (650 mg total) by mouth every 4 (four) hours as needed for mild pain (or temp > 37.5 C (99.5 F)).    [name] aspirin EC 81 MG tablet Take 1 tablet (81 mg total) by mouth daily. 21 tablet 0  . atorvastatin (LIPITOR) 40 MG tablet Take 1 tablet (40 mg total) by mouth daily. 30 tablet 0  . carvedilol (COREG) 3.125 MG tablet Take 1 tablet (3.125 mg total) by mouth 2 (two) times daily with a meal. 60 tablet 0  . Cholecalciferol (VITAMIN D) 50 MCG (2000 UT) CAPS Take 1 capsule (2,000 Units total) by mouth daily. 30 capsule 0  . clopidogrel (PLAVIX) 75 MG tablet Take 1 tablet (75 mg total) by mouth daily. 90 tablet 3  . losartan (COZAAR) 25 MG tablet Take 2 tablets (50 mg total) by mouth daily. 180 tablet 1  . nitroGLYCERIN (NITROSTAT) 0.4 MG SL tablet Place 1 tablet (0.4 mg total) under the tongue every 5 (five) minutes as needed for chest pain. 25 tablet 3  . vitamin B-12 (CYANOCOBALAMIN) 1000 MCG tablet Take 1,000 mcg by mouth daily.     No current facility-administered medications for this visit.     Past Medical History:  Diagnosis Date  . Basal cell carcinoma 11/09/2019   nod & infil-behind right ear-cx3 &exc  . Basal cell carcinoma 03/21/2020   Residual BCC with peripheral margin involved - ST recommends MOHs  . Carotid artery occlusion   . Coronary artery disease    s/p CABG 2005; sees Dr Johnsie Cancel yearly  . Gynecomastia   . Hypercholesterolemia   .  Hypertension   . Neurodermatitis   . Overweight(278.02)   . Personal history of colonic polyps 10/08/2006   tubular adenomas  . Renal insufficiency   . Transient ischemic attack 2010   "lasted ~ 5 seconds"  . Vitamin D deficiency     ROS:   All systems reviewed and negative except as noted in the HPI.   Past Surgical History:  Procedure Laterality Date  . CARDIAC CATHETERIZATION  02/2004   "tried to stent; couldn't"  . CAROTID ENDARTERECTOMY Right 08/26/2015  . CORONARY ANGIOPLASTY    . CORONARY ARTERY BYPASS GRAFT  Feb. 2005   4 vessel  . ENDARTERECTOMY Right 08/26/2015   Procedure: Right Carotid ENDARTERECTOMY with Patch Angioplasty ;  Surgeon: Rosetta Posner, MD;  Location: [place] [facility] [place];  Service: Vascular;  Laterality: Right;  . 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 Dessie Coma  . KELOID EXCISION    . PILONIDAL CYST EXCISION  1989     Family History  Problem Relation Age of Onset  . Heart disease Mother        Before age 29  . Diabetes Mother   . Kidney disease Mother   . Heart attack Mother 49  . Lung cancer Father 31  . Diabetes Brother   .  Heart disease Brother   . Heart disease Brother   . Arthritis Brother   . Diabetes Sister   . Fibromyalgia Sister   . Lung cancer Paternal Uncle        questionable as to if it was lung ca  . Healthy Daughter   . Colon cancer Neg Hx   . Stroke Neg Hx      Social History   Socioeconomic History  . Marital status: Married    Spouse name: [name]  . Number of children: 1  . Years of education: Not on file  . Highest education level: Some college, no degree  Occupational History  . Occupation: retired  Tobacco Use  . Smoking status: Never Smoker  . Smokeless tobacco: Current User    Types: Snuff  . Tobacco comment: uses dip 3-4 times per week  Vaping Use  . Vaping Use: Never used  Substance and Sexual Activity  . Alcohol use: No    Alcohol/week: 0.0 standard drinks  . Drug use: No  . Sexual activity: Yes  Other  Topics Concern  . Not on file  Social History Narrative   Retired.   Once worked for the [employer].   Married.   Enjoys reading, spending time with family.    Left handed   Drinks caffeine   One story home   Social Determinants of Health   Financial Resource Strain: Low Risk   . Difficulty of Paying Living Expenses: Not hard at all  Food Insecurity: No Food Insecurity  . Worried About Charity fundraiser in the Last Year: Never true  . Ran Out of Food in the Last Year: Never true  Transportation Needs: No Transportation Needs  . Lack of Transportation (Medical): No  . Lack of Transportation (Non-Medical): No  Physical Activity: Insufficiently Active  . Days of Exercise per Week: 1 day  . Minutes of Exercise per Session: 20 min  Stress: No Stress Concern Present  . Feeling of Stress : Not at all  Social Connections: Moderately Integrated  . Frequency of Communication with Friends and Family: More than three times a week  . Frequency of Social Gatherings with Friends and Family: More than three times a week  . Attends Religious Services: More than 4 times per year  . Active Member of Clubs or Organizations: No  . Attends Archivist Meetings: Never  . Marital Status: Married  Human resources officer Violence: Not on file     BP (!) 136/56   Pulse 65   Ht 6\' 1"  (1.854 m)   Wt 224 lb 9.6 oz (101.9 kg)   SpO2 97%   BMI 29.63 kg/m   Physical Exam:  Well appearing 80 yo man, NAD HEENT: Unremarkable Neck:  6 cm JVD, no thyromegally Lymphatics:  No adenopathy Back:  No CVA tenderness Lungs:  Clear with no wheezes HEART:  Regular rate rhythm, no murmurs, no rubs, no clicks Abd:  soft, positive bowel sounds, no organomegally, no rebound, no guarding Ext:  2 plus pulses, no edema, no cyanosis, no clubbing Skin:  No rashes no nodules Neuro:  CN II through XII intact, motor grossly intact  EKG - none  Assess/Plan: 1. Cryptogenic stroke - I have recommended insertion of an  ILR. He would like to think about it before proceeding. He will call us if he would like to proceed. 2. Carotid disease- he had an Ultrasound and has followup with Dr. Donnetta Hutching. He is s/p remote CEA 3. HTN -  his bp is controlled reasonably well.   Carleene Overlie Janaisha Tolsma,MD

## 2021-05-08 ENCOUNTER — Other Ambulatory Visit: Payer: Self-pay

## 2021-05-08 ENCOUNTER — Encounter: Payer: Self-pay | Admitting: Vascular Surgery

## 2021-05-08 ENCOUNTER — Ambulatory Visit: Payer: Medicare PPO | Admitting: Vascular Surgery

## 2021-05-08 ENCOUNTER — Other Ambulatory Visit: Payer: Self-pay | Admitting: Primary Care

## 2021-05-08 VITALS — BP 168/80 | HR 64 | Temp 97.9°F | Resp 14 | Ht 73.0 in | Wt 226.0 lb

## 2021-05-08 DIAGNOSIS — I10 Essential (primary) hypertension: Secondary | ICD-10-CM

## 2021-05-08 DIAGNOSIS — I6522 Occlusion and stenosis of left carotid artery: Secondary | ICD-10-CM

## 2021-05-08 DIAGNOSIS — E782 Mixed hyperlipidemia: Secondary | ICD-10-CM

## 2021-05-08 NOTE — H&P (View-Only) (Signed)
  [facility] [place]  Patient name: [name] [name] MRN: [id number] DOB: 04/07/1941 Sex: male  REASON FOR CONSULT: Discuss left internal carotid artery stenosis  HPI: [name] [name] is a 80 y.o. male, who is known to me from prior right carotid endarterectomy for severe asymptomatic disease in 2016.  He recently presented to the hospital with neurologic changes consisting of numbness in the lips on the left side of his mouth and also his left hand and left leg.  This initially resolved and he had recurrent symptoms of this while being seen in the hospital and had a near fall related to weakness in his left leg.  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  During his work-up he had a CT angiogram which showed 70 to 80% calcified left internal carotid artery stenosis.  He had no symptoms referable to this.  He is left-handed.  He reports near complete resolution of his symptoms.  He has subsequently undergone duplex of his carotids which I have for review as well.  He has had a consultation with Dr. Greg Taylor and apparently there is discussion of loop recorder to rule out cardiogenic source.  Past Medical History:  Diagnosis Date  . Basal cell carcinoma 11/09/2019   nod & infil-behind right ear-cx3 &exc  . Basal cell carcinoma 03/21/2020   Residual BCC with peripheral margin involved - ST recommends MOHs  . Carotid artery occlusion   . Coronary artery disease    s/p CABG 2005; sees Dr Nishan yearly  . Gynecomastia   . Hypercholesterolemia   . Hypertension   . Neurodermatitis   . Overweight(278.02)   . Personal history of colonic polyps 10/08/2006   tubular adenomas  . Renal insufficiency   . Transient ischemic attack 2010   "lasted ~ 5 seconds"  . Vitamin D deficiency     Family History  Problem Relation Age of Onset  . Heart disease  Mother        Before age 60  . Diabetes Mother   . Kidney disease Mother   . Heart attack Mother 83  . Lung cancer Father 52  . Diabetes Brother   . Heart disease Brother   . Heart disease Brother   . Arthritis Brother   . Diabetes Sister   . Fibromyalgia Sister   . Lung cancer Paternal Uncle        questionable as to if it was lung ca  . Healthy Daughter   . Colon cancer Neg Hx   . Stroke Neg Hx     SOCIAL HISTORY: Social History   Socioeconomic History  . Marital status: Married    Spouse name: [name]  . Number of children: 1  . Years of education: Not on file  . Highest education level: Some college, no degree  Occupational History  . Occupation: retired  Tobacco Use  . Smoking status: Never Smoker  . Smokeless tobacco: Current User    Types: Snuff  . Tobacco comment: uses dip 3-4 times per week  Vaping Use  . Vaping Use: Never used  Substance and Sexual Activity  . Alcohol use: No    Alcohol/week: 0.0 standard drinks  . Drug use: No  . Sexual activity: Yes  Other Topics Concern  . Not on file  Social History Narrative   Retired.   Once worked for the [employer].   Married.     Enjoys reading, spending time with family.    Left handed   Drinks caffeine   One story home   Social Determinants of Health   Financial Resource Strain: Low Risk   . Difficulty of Paying Living Expenses: Not hard at all  Food Insecurity: No Food Insecurity  . Worried About Running Out of Food in the Last Year: Never true  . Ran Out of Food in the Last Year: Never true  Transportation Needs: No Transportation Needs  . Lack of Transportation (Medical): No  . Lack of Transportation (Non-Medical): No  Physical Activity: Insufficiently Active  . Days of Exercise per Week: 1 day  . Minutes of Exercise per Session: 20 min  Stress: No Stress Concern Present  . Feeling of Stress : Not at all  Social Connections: Moderately Integrated  . Frequency of Communication with Friends and Family:  More than three times a week  . Frequency of Social Gatherings with Friends and Family: More than three times a week  . Attends Religious Services: More than 4 times per year  . Active Member of Clubs or Organizations: No  . Attends Club or Organization Meetings: Never  . Marital Status: Married  Intimate Partner Violence: Not on file    Allergies  Allergen Reactions  . Niacin Other (See Comments)    REACTION: intol to NIACIN w/ headaches REACTION: intol to NIACIN w/ headaches    Current Outpatient Medications  Medication Sig Dispense Refill  . acetaminophen (TYLENOL) 325 MG tablet Take 2 tablets (650 mg total) by mouth every 4 (four) hours as needed for mild pain (or temp > 37.5 C (99.5 F)).    . aspirin EC 81 MG tablet Take 1 tablet (81 mg total) by mouth daily. 21 tablet 0  . atorvastatin (LIPITOR) 40 MG tablet Take 1 tablet (40 mg total) by mouth daily. 30 tablet 0  . carvedilol (COREG) 3.125 MG tablet Take 1 tablet (3.125 mg total) by mouth 2 (two) times daily with a meal. 60 tablet 0  . Cholecalciferol (VITAMIN D) 50 MCG (2000 UT) CAPS Take 1 capsule (2,000 Units total) by mouth daily. 30 capsule 0  . clopidogrel (PLAVIX) 75 MG tablet Take 1 tablet (75 mg total) by mouth daily. 90 tablet 3  . losartan (COZAAR) 25 MG tablet Take 2 tablets (50 mg total) by mouth daily. 180 tablet 1  . nitroGLYCERIN (NITROSTAT) 0.4 MG SL tablet Place 1 tablet (0.4 mg total) under the tongue every 5 (five) minutes as needed for chest pain. 25 tablet 3  . vitamin B-12 (CYANOCOBALAMIN) 1000 MCG tablet Take 1,000 mcg by mouth daily.     No current facility-administered medications for this visit.    REVIEW OF SYSTEMS:  [X] denotes positive finding, [ ] denotes negative finding Cardiac  Comments:  Chest pain or chest pressure:    Shortness of breath upon exertion:    Short of breath when lying flat:    Irregular heart rhythm:        Vascular    Pain in calf, thigh, or hip brought on by  ambulation:    Pain in feet at night that wakes you up from your sleep:     Blood clot in your veins:    Leg swelling:         Pulmonary    Oxygen at home:    Productive cough:     Wheezing:         Neurologic    Sudden weakness in   arms or legs:  x   Sudden numbness in arms or legs:  x   Sudden onset of difficulty speaking or slurred speech:    Temporary loss of vision in one eye:     Problems with dizziness:         Gastrointestinal    Blood in stool:     Vomited blood:         Genitourinary    Burning when urinating:     Blood in urine:        Psychiatric    Major depression:         Hematologic    Bleeding problems:    Problems with blood clotting too easily:        Skin    Rashes or ulcers:        Constitutional    Fever or chills:      PHYSICAL EXAM: Vitals:   05/08/21 0917  BP: (!) 168/80  Pulse: 64  Resp: 14  Temp: 97.9 F (36.6 C)  TempSrc: Other (Comment)  SpO2: 97%  Weight: 226 lb (102.5 kg)  Height: 6\' 1"  (1.854 m)    GENERAL: The patient is a well-nourished male, in no acute distress. The vital signs are documented above. CARDIOVASCULAR: Carotid arteries without bruits bilaterally.  Well-healed right neck incision.  2+ radial pulses bilaterally PULMONARY: There is good air exchange  MUSCULOSKELETAL: There are no major deformities or cyanosis. NEUROLOGIC: No focal weakness or paresthesias are detected. SKIN: There are no ulcers or rashes noted. PSYCHIATRIC: The patient has a normal affect.  DATA:  Reviewed CT scan with the patient and his wife present.  This does show a widely patent endarterectomy on the right.  He does have a very calcified high-grade stenosis by CT scan at the origin of his internal carotid.  Internal carotid above this is normal and he has a normal location for his carotid bifurcation  Carotid duplex from earlier this month suggest a 40 to 59% stenosis however the technologist reported calcification and difficult to  obtain imaging information at the calcified segment.  MEDICAL ISSUES: Recent right thalamic infarct probably related to small vessel disease.  I explained that there is no way the right internal carotid artery stenosis could cause this.  I explained that he does have an asymptomatic stenosis and that by CT this appears to be a high-grade stenosis.  I did discuss endarterectomy explained this would be same as his prior surgery on the right side in 2016.  He does not have any findings that would suggest higher risk for surgery.  I did explain the main risk of 1 to 1-1/2% chance of stroke with surgery with high-grade asymptomatic left carotid stenosis.  I discussed the potential for approximately 5 %/year risk of neurologic deficit, either TIA or stroke.  The patient wishes to proceed with elective endarterectomy.  We will ensure no further cardiac work-up is needed prior to proceeding and we will schedule this at his earliest [place]. Malaijah Houchen, MD FACS [facility] Tel [phone] Pager [phone]  Note: Portions of this report may have been transcribed using voice recognition software.  Every effort has been made to ensure accuracy; however, inadvertent computerized transcription errors may still be present.

## 2021-05-08 NOTE — Progress Notes (Signed)
[facility] [place]  Patient name: [name] [name] MRN: [id number] DOB: 1941/01/31 Sex: male  REASON FOR CONSULT: Discuss left internal carotid artery stenosis  HPI: [name] [name] is a 80 y.o. male, who is known to me from prior right carotid endarterectomy for severe asymptomatic disease in 2016.  He recently presented to the hospital with neurologic changes consisting of numbness in the lips on the left side of his mouth and also his left hand and left leg.  This initially resolved and he had recurrent symptoms of this while being seen in the hospital and had a near fall related to weakness in his left leg.  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  During his work-up he had a CT angiogram which showed 70 to 80% calcified left internal carotid artery stenosis.  He had no symptoms referable to this.  He is left-handed.  He reports near complete resolution of his symptoms.  He has subsequently undergone duplex of his carotids which I have for review as well.  He has had a consultation with Dr. Crissie Sickles and apparently there is discussion of loop recorder to rule out cardiogenic source.  Past Medical History:  Diagnosis Date  . Basal cell carcinoma 11/09/2019   nod & infil-behind right ear-cx3 &exc  . Basal cell carcinoma 03/21/2020   Residual BCC with peripheral margin involved - ST recommends MOHs  . Carotid artery occlusion   . Coronary artery disease    s/p CABG 2005; sees Dr Johnsie Cancel yearly  . Gynecomastia   . Hypercholesterolemia   . Hypertension   . Neurodermatitis   . Overweight(278.02)   . Personal history of colonic polyps 10/08/2006   tubular adenomas  . Renal insufficiency   . Transient ischemic attack 2010   "lasted ~ 5 seconds"  . Vitamin D deficiency     Family History  Problem Relation Age of Onset  . Heart disease  Mother        Before age 35  . Diabetes Mother   . Kidney disease Mother   . Heart attack Mother 70  . Lung cancer Father 40  . Diabetes Brother   . Heart disease Brother   . Heart disease Brother   . Arthritis Brother   . Diabetes Sister   . Fibromyalgia Sister   . Lung cancer Paternal Uncle        questionable as to if it was lung ca  . Healthy Daughter   . Colon cancer Neg Hx   . Stroke Neg Hx     SOCIAL HISTORY: Social History   Socioeconomic History  . Marital status: Married    Spouse name: [name]  . Number of children: 1  . Years of education: Not on file  . Highest education level: Some college, no degree  Occupational History  . Occupation: retired  Tobacco Use  . Smoking status: Never Smoker  . Smokeless tobacco: Current User    Types: Snuff  . Tobacco comment: uses dip 3-4 times per week  Vaping Use  . Vaping Use: Never used  Substance and Sexual Activity  . Alcohol use: No    Alcohol/week: 0.0 standard drinks  . Drug use: No  . Sexual activity: Yes  Other Topics Concern  . Not on file  Social History Narrative   Retired.   Once worked for the [employer].   Married.  Enjoys reading, spending time with family.    Left handed   Drinks caffeine   One story home   Social Determinants of Health   Financial Resource Strain: Low Risk   . Difficulty of Paying Living Expenses: Not hard at all  Food Insecurity: No Food Insecurity  . Worried About Charity fundraiser in the Last Year: Never true  . Ran Out of Food in the Last Year: Never true  Transportation Needs: No Transportation Needs  . Lack of Transportation (Medical): No  . Lack of Transportation (Non-Medical): No  Physical Activity: Insufficiently Active  . Days of Exercise per Week: 1 day  . Minutes of Exercise per Session: 20 min  Stress: No Stress Concern Present  . Feeling of Stress : Not at all  Social Connections: Moderately Integrated  . Frequency of Communication with Friends and Family:  More than three times a week  . Frequency of Social Gatherings with Friends and Family: More than three times a week  . Attends Religious Services: More than 4 times per year  . Active Member of Clubs or Organizations: No  . Attends Archivist Meetings: Never  . Marital Status: Married  Human resources officer Violence: Not on file    Allergies  Allergen Reactions  . Niacin Other (See Comments)    REACTION: intol to NIACIN w/ headaches REACTION: intol to NIACIN w/ headaches    Current Outpatient Medications  Medication Sig Dispense Refill  . acetaminophen (TYLENOL) 325 MG tablet Take 2 tablets (650 mg total) by mouth every 4 (four) hours as needed for mild pain (or temp > 37.5 C (99.5 F)).    [name] aspirin EC 81 MG tablet Take 1 tablet (81 mg total) by mouth daily. 21 tablet 0  . atorvastatin (LIPITOR) 40 MG tablet Take 1 tablet (40 mg total) by mouth daily. 30 tablet 0  . carvedilol (COREG) 3.125 MG tablet Take 1 tablet (3.125 mg total) by mouth 2 (two) times daily with a meal. 60 tablet 0  . Cholecalciferol (VITAMIN D) 50 MCG (2000 UT) CAPS Take 1 capsule (2,000 Units total) by mouth daily. 30 capsule 0  . clopidogrel (PLAVIX) 75 MG tablet Take 1 tablet (75 mg total) by mouth daily. 90 tablet 3  . losartan (COZAAR) 25 MG tablet Take 2 tablets (50 mg total) by mouth daily. 180 tablet 1  . nitroGLYCERIN (NITROSTAT) 0.4 MG SL tablet Place 1 tablet (0.4 mg total) under the tongue every 5 (five) minutes as needed for chest pain. 25 tablet 3  . vitamin B-12 (CYANOCOBALAMIN) 1000 MCG tablet Take 1,000 mcg by mouth daily.     No current facility-administered medications for this visit.    REVIEW OF SYSTEMS:  [X]  denotes positive finding, [ ]  denotes negative finding Cardiac  Comments:  Chest pain or chest pressure:    Shortness of breath upon exertion:    Short of breath when lying flat:    Irregular heart rhythm:        Vascular    Pain in calf, thigh, or hip brought on by  ambulation:    Pain in feet at night that wakes you up from your sleep:     Blood clot in your veins:    Leg swelling:         Pulmonary    Oxygen at home:    Productive cough:     Wheezing:         Neurologic    Sudden weakness in  arms or legs:  x   Sudden numbness in arms or legs:  x   Sudden onset of difficulty speaking or slurred speech:    Temporary loss of vision in one eye:     Problems with dizziness:         Gastrointestinal    Blood in stool:     Vomited blood:         Genitourinary    Burning when urinating:     Blood in urine:        Psychiatric    Major depression:         Hematologic    Bleeding problems:    Problems with blood clotting too easily:        Skin    Rashes or ulcers:        Constitutional    Fever or chills:      PHYSICAL EXAM: Vitals:   05/08/21 0917  BP: (!) 168/80  Pulse: 64  Resp: 14  Temp: 97.9 F (36.6 C)  TempSrc: Other (Comment)  SpO2: 97%  Weight: 226 lb (102.5 kg)  Height: 6\' 1"  (1.854 m)    GENERAL: The patient is a well-nourished male, in no acute distress. The vital signs are documented above. CARDIOVASCULAR: Carotid arteries without bruits bilaterally.  Well-healed right neck incision.  2+ radial pulses bilaterally PULMONARY: There is good air exchange  MUSCULOSKELETAL: There are no major deformities or cyanosis. NEUROLOGIC: No focal weakness or paresthesias are detected. SKIN: There are no ulcers or rashes noted. PSYCHIATRIC: The patient has a normal affect.  DATA:  Reviewed CT scan with the patient and his wife present.  This does show a widely patent endarterectomy on the right.  He does have a very calcified high-grade stenosis by CT scan at the origin of his internal carotid.  Internal carotid above this is normal and he has a normal location for his carotid bifurcation  Carotid duplex from earlier this month suggest a 40 to 59% stenosis however the technologist reported calcification and difficult to  obtain imaging information at the calcified segment.  MEDICAL ISSUES: Recent right thalamic infarct probably related to small vessel disease.  I explained that there is no way the right internal carotid artery stenosis could cause this.  I explained that he does have an asymptomatic stenosis and that by CT this appears to be a high-grade stenosis.  I did discuss endarterectomy explained this would be same as his prior surgery on the right side in 2016.  He does not have any findings that would suggest higher risk for surgery.  I did explain the main risk of 1 to 1-1/2% chance of stroke with surgery with high-grade asymptomatic left carotid stenosis.  I discussed the potential for approximately 5 %/year risk of neurologic deficit, either TIA or stroke.  The patient wishes to proceed with elective endarterectomy.  We will ensure no further cardiac work-up is needed prior to proceeding and we will schedule this at his earliest [place]. Tammra Pressman, MD FACS [facility] Tel [phone] Pager [phone]  Note: Portions of this report may have been transcribed using voice recognition software.  Every effort has been made to ensure accuracy; however, inadvertent computerized transcription errors may still be present.

## 2021-05-09 ENCOUNTER — Telehealth: Payer: Self-pay | Admitting: Cardiovascular Disease

## 2021-05-09 NOTE — Telephone Encounter (Signed)
   [facility] Pre-operative Risk Assessment    Request for surgical clearance:  1. What type of surgery is being performed? Left carotid endarterectomy   2. When is this surgery scheduled? TBD  3. What type of clearance is required (medical clearance vs. Pharmacy clearance to hold med vs. Both)? Medical clearance   4. Are there any medications that need to be held prior to surgery and how long? no  5. Practice name and name of physician performing surgery? Dr. Sherren Mocha Early  6. What is your office phone number [phone]   7.   What is your office fax number [phone]  8.   Anesthesia type (None, local, MAC, general) ? General    [name] [name] 05/09/2021, 3:39 PM  _________________________________________________________________   (provider comments below)

## 2021-05-10 ENCOUNTER — Other Ambulatory Visit: Payer: Self-pay

## 2021-05-10 ENCOUNTER — Ambulatory Visit: Payer: Medicare PPO

## 2021-05-10 DIAGNOSIS — R2689 Other abnormalities of gait and mobility: Secondary | ICD-10-CM | POA: Diagnosis not present

## 2021-05-10 DIAGNOSIS — M6281 Muscle weakness (generalized): Secondary | ICD-10-CM

## 2021-05-10 DIAGNOSIS — I639 Cerebral infarction, unspecified: Secondary | ICD-10-CM | POA: Diagnosis not present

## 2021-05-10 DIAGNOSIS — I6381 Other cerebral infarction due to occlusion or stenosis of small artery: Secondary | ICD-10-CM

## 2021-05-10 NOTE — Telephone Encounter (Signed)
   Name: [name] [name]  DOB: Sep 30, 1941  MRN: [id number]   Primary Cardiologist: [name], MD  Chart reviewed as part of pre-operative protocol coverage. Patient was contacted 05/10/2021 in reference to pre-operative risk assessment for pending surgery as outlined below.  [name] [name] was last seen on 05/05/21 by Dr. Lovena Le.  Since that day, [name] [name] has done fine from a cardiac standpoint. He has continued going to rehab following his CVA 03/2021. He can complete 4 METs without anginal complaints.   Therefore, based on ACC/AHA guidelines, the patient would be at acceptable risk for the planned procedure without further cardiovascular testing.   The patient was advised that if he develops new symptoms prior to surgery to contact our office to arrange for a follow-up visit, and he verbalized understanding.  Patient is on plavix for recent CVA. Will defer recommendations for holding plavix prior to his upcoming CEA to his neurologist.   I will route this recommendation to the requesting party via [facility] fax function and remove from pre-op pool. Please call with questions.  Abigail Butts, PA-C 05/10/2021, 2:21 PM

## 2021-05-10 NOTE — Therapy (Signed)
[name] [name] [address] [place], [place], [address] Phone: [phone]   Fax:  [phone]  Physical Therapy Treatment  Patient Details  Name: [name] [name] MRN: [id number] Date of Birth: Oct 08, 1941 Referring Provider (PT): Dr. Alysia Penna   Encounter Date: 05/10/2021   PT End of Session - 05/10/21 0935    Visit Number 4    Number of Visits 7    Date for PT Re-Evaluation 05/17/21    Authorization Type [employer]    Authorization Time Period Plan for 4 visits and then assess for discharge    PT Start Time 0935    PT Stop Time 1015    PT Time Calculation (min) 40 min    Equipment Utilized During Treatment Gait belt    Activity Tolerance Patient tolerated treatment well    Behavior During Therapy [facility] for tasks assessed/performed           Past Medical History:  Diagnosis Date  . Basal cell carcinoma 11/09/2019   nod & infil-behind right ear-cx3 &exc  . Basal cell carcinoma 03/21/2020   Residual BCC with peripheral margin involved - ST recommends MOHs  . Carotid artery occlusion   . Coronary artery disease    s/p CABG 2005; sees Dr Johnsie Cancel yearly  . Gynecomastia   . Hypercholesterolemia   . Hypertension   . Neurodermatitis   . Overweight(278.02)   . Personal history of colonic polyps 10/08/2006   tubular adenomas  . Renal insufficiency   . Transient ischemic attack 2010   "lasted ~ 5 seconds"  . Vitamin D deficiency     Past Surgical History:  Procedure Laterality Date  . CARDIAC CATHETERIZATION  02/2004   "tried to stent; couldn't"  . CAROTID ENDARTERECTOMY Right 08/26/2015  . CORONARY ANGIOPLASTY    . CORONARY ARTERY BYPASS GRAFT  Feb. 2005   4 vessel  . ENDARTERECTOMY Right 08/26/2015   Procedure: Right Carotid ENDARTERECTOMY with Patch Angioplasty ;  Surgeon: Rosetta Posner, MD;  Location: [facility];  Service: Vascular;  Laterality: Right;  . 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 Dessie Coma   . KELOID EXCISION    . PILONIDAL CYST EXCISION  1989    There were no vitals filed for this visit.   Subjective Assessment - 05/10/21 0941    Subjective Pt reports I feel like I am stronger.    Patient is accompained by: Family member   wife   Limitations Walking    Patient Stated Goals improve balane    Currently in Pain? No/denies             Sit to stand: 30lbs 2 x 5 from higher mat table  Standing deadlifts: 30lbs 10x Step ladder:  - reciprocal steps: 2 laps fwd and bwd - step to pattern: focused on speed: Leading with R leg: 3 laps; leading with L leg: 3 laps - Lateral steps: 3 laps each way                        PT Short Term Goals - 04/19/21 1043      PT SHORT TERM GOAL #1   Title STG=LTG             PT Long Term Goals - 04/19/21 1043      PT LONG TERM GOAL #1   Title Pt will demo 5x sit to stand in 16 sec or less to improve functional strength  Baseline 21 sec (04/19/21)    Time 6    Period Weeks    Status New    Target Date 05/31/21      PT LONG TERM GOAL #2   Title Pt will be I and compliant with walking program to independently manage symptoms at home.    Baseline Walking progrm issued on 04/19/21    Time 6    Period Weeks    Status New    Target Date 05/31/21      PT LONG TERM GOAL #3   Title Pt will demo 85 points on LE CVA on FOTO to improve overall function    Baseline 74 (4/20/2)    Time 6    Period Weeks    Status New    Target Date 05/31/21      PT LONG TERM GOAL #4   Title Pt will demo 0.6m/s walking speed to improve fall risk and community ambulation    Baseline 0.70m/s (04/19/21) no AD;    Time 6    Period Weeks    Status New    Target Date 05/31/21                 Plan - 05/10/21 1013    Clinical Impression Statement Today's session was focused on progressing strengthening and working on agility exercises. Pt tolerated session well.    Personal Factors and Comorbidities Age;Time since onset  of injury/illness/exacerbation;Past/Current Experience    Examination-Activity Limitations Lift;Stairs;Squat    Examination-Participation Restrictions Church;Community Activity;Driving;Shop;Yard Work;Cleaning    Stability/Clinical Decision Making Stable/Uncomplicated    Clinical Decision Making Low    Rehab Potential Good    PT Frequency 1x / week    PT Duration 6 weeks    PT Treatment/Interventions ADLs/Self Care Home Management;Gait training;Stair training;Functional mobility training;Therapeutic activities;Therapeutic exercise;Balance training;Neuromuscular re-education;Patient/family education;Orthotic Fit/Training;Manual techniques;Passive range of motion;Energy conservation;Joint Manipulations    PT Next Visit Plan Add more sessions.           Patient will benefit from skilled therapeutic intervention in order to improve the following deficits and impairments:  Abnormal gait,Decreased activity tolerance,Decreased balance,Decreased endurance,Decreased mobility,Difficulty walking,Decreased strength,Postural dysfunction  Visit Diagnosis: Other abnormalities of gait and mobility  Right thalamic infarction [facility])  Muscle weakness (generalized)     Problem List Patient Active Problem List   Diagnosis Date Noted  . 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[place]) 04/06/2021  . TIA (transient ischemic attack) 04/03/2021  . Lower extremity edema 08/13/2019  . Unsteady gait 06/09/2019  . Preventative health care 06/09/2019  . Hyperlipidemia 01/30/2016  . Carotid stenosis 08/26/2015  . 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 Carotid artery disease ([place]) 07/01/2012  . Special screening for malignant neoplasm of prostate 05/08/2011  . Vitamin D deficiency 11/20/2009  . Transient cerebral ischemia 06/30/2009  . CEREBROVASCULAR DISEASE 06/30/2009  . COLONIC POLYPS 01/10/2008  . Essential hypertension 01/10/2008  . Coronary atherosclerosis 01/10/2008  . 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  GYNECOMASTIA, UNILATERAL 01/10/2008  . 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 SHOULDER PAIN 01/10/2008  . Type 2 diabetes mellitus ([place]) 01/10/2008    [name], PT 05/10/2021, 10:19 AM  [facility] [address] [place], [place], [address] Phone: [phone]   Fax:  [phone]  Name: [name] [name] MRN: [id number] Date of Birth: 22-Dec-1941

## 2021-05-12 ENCOUNTER — Other Ambulatory Visit: Payer: Self-pay

## 2021-05-15 NOTE — Progress Notes (Signed)
Surgical Instructions    Your procedure is scheduled on Thursday, May 19th, 2022  Report to [facility] Main Entrance "A" at 05:45 A.M., then check in with the Admitting office.  Call this number if you have problems the morning of surgery:  [phone]   If you have any questions prior to your surgery date call [phone]: Open Monday-Friday 8am-4pm    Remember:  Do not eat or drink after midnight the night before your surgery   Take these medicines the morning of surgery with A SIP OF WATER   atorvastatin (LIPITOR)  carvedilol (COREG)  If needed:  acetaminophen (TYLENOL)  nitroGLYCERIN (NITROSTAT) - let the nurse know if you used it  Follow your surgeon's instructions on when to stop Aspirin and Plavix.  If no instructions were given by your surgeon then you will need to call the office to get those instructions.    As of today, STOP taking Aleve, Naproxen, Ibuprofen, Motrin, Advil, Goody's, BC's, all herbal medications, fish oil, and all vitamins.                     Do not wear jewelry            Do not wear lotions, powders, colognes, or deodorant.            Men may shave face and neck.            Do not bring valuables to the hospital.            [facility] is not responsible for any belongings or valuables.  Do NOT Smoke (Tobacco/Vaping) or drink Alcohol 24 hours prior to your procedure If you use a CPAP at night, you may bring all equipment for your overnight stay.   Contacts, glasses, dentures or bridgework may not be worn into surgery, please bring cases for these belongings   For patients admitted to the hospital, discharge time will be determined by your treatment team.   Patients discharged the day of surgery will not be allowed to drive home, and someone needs to stay with them for 24 hours.    Special instructions:   [place]- Preparing For Surgery  Before surgery, you can play an important role. Because skin is not sterile, your skin needs to be  as free of germs as possible. You can reduce the number of germs on your skin by washing with CHG (chlorahexidine gluconate) Soap before surgery.  CHG is an antiseptic cleaner which kills germs and bonds with the skin to continue killing germs even after washing.    Oral Hygiene is also important to reduce your risk of infection.  Remember - BRUSH YOUR TEETH THE MORNING OF SURGERY WITH YOUR REGULAR TOOTHPASTE  Please do not use if you have an allergy to CHG or antibacterial soaps. If your skin becomes reddened/irritated stop using the CHG.  Do not shave (including legs and underarms) for at least 48 hours prior to first CHG shower. It is OK to shave your face.  Please follow these instructions carefully.   1. Shower the NIGHT BEFORE SURGERY and the MORNING OF SURGERY  2. If you chose to wash your hair, wash your hair first as usual with your normal shampoo.  3. After you shampoo, rinse your hair and body thoroughly to remove the shampoo.  4. Use CHG Soap as you would any other liquid soap. You can apply CHG directly to the skin and wash gently with a scrungie or  a clean washcloth.   5. Apply the CHG Soap to your body ONLY FROM THE NECK DOWN.  Do not use on open wounds or open sores. Avoid contact with your eyes, ears, mouth and genitals (private parts). Wash Face and genitals (private parts)  with your normal soap.   6. Wash thoroughly, paying special attention to the area where your surgery will be performed.  7. Thoroughly rinse your body with warm water from the neck down.  8. DO NOT shower/wash with your normal soap after using and rinsing off the CHG Soap.  9. Pat yourself dry with a CLEAN TOWEL.  10. Wear CLEAN PAJAMAS to bed the night before surgery  11. Place CLEAN SHEETS on your bed the night before your surgery  12. DO NOT SLEEP WITH PETS.   Day of Surgery: Take a shower with CHG soap. Wear Clean/Comfortable clothing the morning of surgery Do not apply any  deodorants/lotions.   Remember to brush your teeth WITH YOUR REGULAR TOOTHPASTE.   Please read over the following fact sheets that you were given.

## 2021-05-16 ENCOUNTER — Other Ambulatory Visit: Payer: Self-pay

## 2021-05-16 ENCOUNTER — Encounter (HOSPITAL_COMMUNITY)
Admission: RE | Admit: 2021-05-16 | Discharge: 2021-05-16 | Disposition: A | Discharge status: Home / Self Care | Payer: Medicare PPO | Source: Ambulatory Visit | Attending: Vascular Surgery | Admitting: Vascular Surgery

## 2021-05-16 ENCOUNTER — Encounter (HOSPITAL_COMMUNITY): Payer: Self-pay

## 2021-05-16 DIAGNOSIS — I10 Essential (primary) hypertension: Secondary | ICD-10-CM | POA: Diagnosis present

## 2021-05-16 DIAGNOSIS — Z888 Allergy status to other drugs, medicaments and biological substances status: Secondary | ICD-10-CM | POA: Diagnosis not present

## 2021-05-16 DIAGNOSIS — Z841 Family history of disorders of kidney and ureter: Secondary | ICD-10-CM | POA: Diagnosis not present

## 2021-05-16 DIAGNOSIS — R2689 Other abnormalities of gait and mobility: Secondary | ICD-10-CM | POA: Diagnosis present

## 2021-05-16 DIAGNOSIS — Z85828 Personal history of other malignant neoplasm of skin: Secondary | ICD-10-CM | POA: Diagnosis not present

## 2021-05-16 DIAGNOSIS — Z951 Presence of aortocoronary bypass graft: Secondary | ICD-10-CM | POA: Diagnosis not present

## 2021-05-16 DIAGNOSIS — Z8249 Family history of ischemic heart disease and other diseases of the circulatory system: Secondary | ICD-10-CM | POA: Diagnosis not present

## 2021-05-16 DIAGNOSIS — E119 Type 2 diabetes mellitus without complications: Secondary | ICD-10-CM | POA: Diagnosis present

## 2021-05-16 DIAGNOSIS — Z8673 Personal history of transient ischemic attack (TIA), and cerebral infarction without residual deficits: Secondary | ICD-10-CM | POA: Insufficient documentation

## 2021-05-16 DIAGNOSIS — Z801 Family history of malignant neoplasm of trachea, bronchus and lung: Secondary | ICD-10-CM | POA: Diagnosis not present

## 2021-05-16 DIAGNOSIS — Z01812 Encounter for preprocedural laboratory examination: Secondary | ICD-10-CM | POA: Insufficient documentation

## 2021-05-16 DIAGNOSIS — E7800 Pure hypercholesterolemia, unspecified: Secondary | ICD-10-CM | POA: Diagnosis present

## 2021-05-16 DIAGNOSIS — I639 Cerebral infarction, unspecified: Secondary | ICD-10-CM | POA: Diagnosis not present

## 2021-05-16 DIAGNOSIS — I63232 Cerebral infarction due to unspecified occlusion or stenosis of left carotid arteries: Secondary | ICD-10-CM | POA: Diagnosis not present

## 2021-05-16 DIAGNOSIS — I69398 Other sequelae of cerebral infarction: Secondary | ICD-10-CM | POA: Diagnosis not present

## 2021-05-16 DIAGNOSIS — Z7982 Long term (current) use of aspirin: Secondary | ICD-10-CM | POA: Diagnosis not present

## 2021-05-16 DIAGNOSIS — I872 Venous insufficiency (chronic) (peripheral): Secondary | ICD-10-CM | POA: Diagnosis not present

## 2021-05-16 DIAGNOSIS — Z20822 Contact with and (suspected) exposure to covid-19: Secondary | ICD-10-CM | POA: Insufficient documentation

## 2021-05-16 DIAGNOSIS — Z79899 Other long term (current) drug therapy: Secondary | ICD-10-CM | POA: Diagnosis not present

## 2021-05-16 DIAGNOSIS — Z7902 Long term (current) use of antithrombotics/antiplatelets: Secondary | ICD-10-CM | POA: Diagnosis not present

## 2021-05-16 DIAGNOSIS — I2510 Atherosclerotic heart disease of native coronary artery without angina pectoris: Secondary | ICD-10-CM | POA: Diagnosis present

## 2021-05-16 DIAGNOSIS — I6522 Occlusion and stenosis of left carotid artery: Secondary | ICD-10-CM | POA: Diagnosis present

## 2021-05-16 DIAGNOSIS — Z833 Family history of diabetes mellitus: Secondary | ICD-10-CM | POA: Diagnosis not present

## 2021-05-16 DIAGNOSIS — E559 Vitamin D deficiency, unspecified: Secondary | ICD-10-CM | POA: Diagnosis present

## 2021-05-16 HISTORY — DX: Type 2 diabetes mellitus without complications: E11.9

## 2021-05-16 HISTORY — DX: Cerebral infarction, unspecified: I63.9

## 2021-05-16 LAB — COMPREHENSIVE METABOLIC PANEL
ALT: 27 U/L (ref 0–44)
AST: 30 U/L (ref 15–41)
Albumin: 3.5 g/dL (ref 3.5–5.0)
Alkaline Phosphatase: 79 U/L (ref 38–126)
Anion gap: 6 (ref 5–15)
BUN: 11 mg/dL (ref 8–23)
CO2: 23 mmol/L (ref 22–32)
Calcium: 8.7 mg/dL — ABNORMAL LOW (ref 8.9–10.3)
Chloride: 109 mmol/L (ref 98–111)
Creatinine, Ser: 0.9 mg/dL (ref 0.61–1.24)
GFR, Estimated: >60 mL/min (ref >60)
Glucose, Bld: 107 mg/dL — ABNORMAL HIGH (ref 70–99)
Potassium: 4.2 mmol/L (ref 3.5–5.1)
Sodium: 138 mmol/L (ref 135–145)
Total Bilirubin: 1.4 mg/dL — ABNORMAL HIGH (ref 0.3–1.2)
Total Protein: 6.4 g/dL — ABNORMAL LOW (ref 6.5–8.1)

## 2021-05-16 LAB — BLOOD GAS, ARTERIAL
Acid-Base Excess: 0.6 mmol/L (ref 0.0–2.0)
Bicarbonate: 24.4 mmol/L (ref 20.0–28.0)
Drawn by: 58793
FIO2: 21
O2 Saturation: 96 %
Patient temperature: 37
pCO2 arterial: 37.4 mmHg (ref 32.0–48.0)
pH, Arterial: 7.431 (ref 7.350–7.450)
pO2, Arterial: 78.1 mmHg — ABNORMAL LOW (ref 83.0–108.0)

## 2021-05-16 LAB — GLUCOSE, CAPILLARY: Glucose-Capillary: 132 mg/dL — ABNORMAL HIGH (ref 70–99)

## 2021-05-16 LAB — URINALYSIS, ROUTINE W REFLEX MICROSCOPIC
Bilirubin Urine: NEGATIVE
Glucose, UA: NEGATIVE mg/dL
Hgb urine dipstick: NEGATIVE
Ketones, ur: NEGATIVE mg/dL
Leukocytes,Ua: NEGATIVE
Nitrite: NEGATIVE
Protein, ur: NEGATIVE mg/dL
Specific Gravity, Urine: 1.011 (ref 1.005–1.030)
pH: 5 (ref 5.0–8.0)

## 2021-05-16 LAB — CBC
HCT: 45.5 % (ref 39.0–52.0)
Hemoglobin: 15.9 g/dL (ref 13.0–17.0)
MCH: 35 pg — ABNORMAL HIGH (ref 26.0–34.0)
MCHC: 34.9 g/dL (ref 30.0–36.0)
MCV: 100.2 fL — ABNORMAL HIGH (ref 80.0–100.0)
Platelets: 195 10*3/uL (ref 150–400)
RBC: 4.54 MIL/uL (ref 4.22–5.81)
RDW: 12.9 % (ref 11.5–15.5)
WBC: 6.3 10*3/uL (ref 4.0–10.5)
nRBC: 0 % (ref 0.0–0.2)

## 2021-05-16 LAB — SURGICAL PCR SCREEN
MRSA, PCR: NEGATIVE
Staphylococcus aureus: NEGATIVE

## 2021-05-16 LAB — TYPE AND SCREEN
ABO/RH(D): A POS
Antibody Screen: NEGATIVE

## 2021-05-16 LAB — APTT: aPTT: 30 seconds (ref 24–36)

## 2021-05-16 LAB — PROTIME-INR
INR: 1 (ref 0.8–1.2)
Prothrombin Time: 13.3 seconds (ref 11.4–15.2)

## 2021-05-16 LAB — SARS CORONAVIRUS 2 (TAT 6-24 HRS): SARS Coronavirus 2: NEGATIVE

## 2021-05-16 NOTE — Progress Notes (Signed)
PCP - Alma Friendly, NP Cardiologist - [name], MD Neurologist - Metta Clines, MD  PPM/ICD - denies Device Orders - N/A Rep Notified - N/A  Chest x-ray - N/A EKG - 04/19/2021 Stress Test - 09/09/2018 ECHO - 04/03/2021 Cardiac Cath - 02/2004  Sleep Study - denies CPAP - N/A  Fasting Blood Sugar - 100 -110 CBG today 132 Checks Blood Sugar once a week A1C 6.7 (04/03/2021)  Blood Thinner Instructions: Per patient, MD recommended to not stop taking Plavix prior surgery. Aspirin Instructions: Patient was instructed: As of today, STOP taking Aleve, Naproxen, Ibuprofen, Motrin, Advil, Goody's, BC's, all herbal medications, fish oil, and all vitamins. Patient verbalized that he is not taking Aspirin anymore.  ERAS Protcol - No  COVID TEST- 05/16/2021   Anesthesia review: yes; cardiac history; recent hospitalization for TIA  Patient denies shortness of breath, fever, cough and chest pain at PAT appointment   All instructions explained to the patient, with a verbal understanding of the material. Patient agrees to go over the instructions while at home for a better understanding. Patient also instructed to self quarantine after being tested for COVID-19. The opportunity to ask questions was provided.

## 2021-05-17 ENCOUNTER — Ambulatory Visit: Payer: Medicare PPO

## 2021-05-17 ENCOUNTER — Other Ambulatory Visit (HOSPITAL_COMMUNITY): Payer: Medicare PPO

## 2021-05-17 DIAGNOSIS — I639 Cerebral infarction, unspecified: Secondary | ICD-10-CM

## 2021-05-17 DIAGNOSIS — I6381 Other cerebral infarction due to occlusion or stenosis of small artery: Secondary | ICD-10-CM

## 2021-05-17 DIAGNOSIS — R2689 Other abnormalities of gait and mobility: Secondary | ICD-10-CM

## 2021-05-17 DIAGNOSIS — M6281 Muscle weakness (generalized): Secondary | ICD-10-CM

## 2021-05-17 NOTE — Progress Notes (Signed)
Anesthesia Chart Review:  Follows with cardiology for history of CAD s/p CABG 2005, last Myoview 09/09/2018 showed no ischemia with EF 64%, low risk.  He also has history of carotid disease s/p right CEA 2016 with Dr. Donnetta Hutching.  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  Echo was normal with EF 60%.  MRI showed small thalamic stroke.  Patient was last seen by Dr. Johnsie Cancel 04/18/2021.  Discussed recent thalamic stroke and possibility of carotid stenosis (equivocal imaging during admission).  Dr. Johnsie Cancel referred the patient back to vascular surgery. Pt was seen by Dr. Donnetta Hutching 05/08/21 and per note, recent stroke was felt due to small vessel disease but the pt does also have high-grade asymptomatic left carotid stenosis and CEA was recommended.   Cardiac clearance per telephone encounter 05/08/21 states, "Chart reviewed as part of pre-operative protocol coverage. Patient was contacted 05/10/2021 in reference to pre-operative risk assessment for pending surgery as outlined below.  SELVIN YUN was last seen on 05/05/21 by Dr. Lovena Le.  Since that day, WINTHROP SHANNAHAN has done fine from a cardiac standpoint. He has continued going to rehab following his CVA 03/2021. He can complete 4 METs without anginal complaints. Therefore, based on ACC/AHA guidelines, the patient would be at acceptable risk for the planned procedure without further cardiovascular testing. The patient was advised that if he develops new symptoms prior to surgery to contact our office to arrange for a follow-up visit, and he verbalized understanding.Patient is on plavix for recent CVA. Will defer recommendations for holding plavix prior to his upcoming CEA to his neurologist."  Pt stated he was instructed to continue Plavix.   DM2, last A1c 6.7 on 04/03/2021  Preop labs reviewed, unremarkable.   EKG 04/03/2021: NSR.  Rate 85.  Nonspecific ST and T wave abnormality.  TTE  04/03/21: 1. Left ventricular ejection fraction, by estimation, is 60 to 65%. The  left ventricle has normal function. The left ventricle has no regional  wall motion abnormalities. Left ventricular diastolic parameters are  indeterminate.  2. Right ventricular systolic function is normal. The right ventricular  size is normal.  3. The mitral valve is normal in structure. No evidence of mitral valve  regurgitation. No evidence of mitral stenosis.  4. The aortic valve is tricuspid. There is mild calcification of the  aortic valve. Aortic valve regurgitation is not visualized. Mild to  moderate aortic valve sclerosis/calcification is present, without any  evidence of aortic stenosis.  5. The inferior vena cava is normal in size with greater than 50%  respiratory variability, suggesting right atrial pressure of 3 mmHg.   Nuclear stress 09/09/18:  Nuclear stress EF: 64%.  There was no ST segment deviation noted during stress.  There is a small defect of mild severity present in the apical inferior location. The defect is non-reversible. In the setting of normal LVF this is consistent with diaphragmatic attenuation artifact. No ischemia noted.  The left ventricular ejection fraction is normal (55-65%).  This is a low risk study.    Wynonia Musty [facility] Short Stay Center/Anesthesiology Phone [phone] 05/17/2021 10:25 AM

## 2021-05-17 NOTE — Anesthesia Preprocedure Evaluation (Addendum)
Anesthesia Evaluation  Patient identified by MRN, date of birth, ID band Patient awake    Reviewed: Allergy & Precautions, H&P , NPO status , Patient's Chart, lab work & pertinent test results  Airway Mallampati: II  TM Distance: >3 FB Neck ROM: Full    Dental no notable dental hx. (+) Teeth Intact, Dental Advisory Given   Pulmonary neg pulmonary ROS,    Pulmonary exam normal breath sounds clear to auscultation       Cardiovascular Exercise Tolerance: Good hypertension, Pt. on medications and Pt. on home beta blockers + CAD, + CABG and + Peripheral Vascular Disease   Rhythm:Regular Rate:Normal     Neuro/Psych Anxiety TIACVA, Residual Symptoms    GI/Hepatic negative GI ROS, Neg liver ROS,   Endo/Other  diabetes  Renal/GU Renal InsufficiencyRenal disease  negative genitourinary   Musculoskeletal   Abdominal   Peds  Hematology negative hematology ROS (+)   Anesthesia Other Findings   Reproductive/Obstetrics negative OB ROS                           Anesthesia Physical Anesthesia Plan  ASA: III  Anesthesia Plan: General   Post-op Pain Management:    Induction: Intravenous  PONV Risk Score and Plan: 3 and Ondansetron and Dexamethasone  Airway Management Planned: Oral ETT  Additional Equipment: Arterial line  Intra-op Plan:   Post-operative Plan: Extubation in OR  Informed Consent: I have reviewed the patients History and Physical, chart, labs and discussed the procedure including the risks, benefits and alternatives for the proposed anesthesia with the patient or authorized representative who has indicated his/her understanding and acceptance.     Dental advisory given  Plan Discussed with: CRNA  Anesthesia Plan Comments: (PAT note by Karoline Caldwell, PA-C: Follows with cardiology for history of CAD s/p CABG 2005, last Myoview 09/09/2018 showed no ischemia with EF 64%, low risk.   He also has history of carotid disease s/p right CEA 2016 with Dr. Donnetta Hutching.  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  Echo was normal with EF 60%.  MRI showed small thalamic stroke.  Patient was last seen by Dr. Johnsie Cancel 04/18/2021.  Discussed recent thalamic stroke and possibility of carotid stenosis (equivocal imaging during admission).  Dr. Johnsie Cancel referred the patient back to vascular surgery. Pt was seen by Dr. Donnetta Hutching 05/08/21 and per note, recent stroke was felt due to small vessel disease but the pt does also have high-grade asymptomatic left carotid stenosis and CEA was recommended.   Cardiac clearance per telephone encounter 05/08/21 states, "Chart reviewed as part of pre-operative protocol coverage. Patient was contacted 05/10/2021 in reference to pre-operative risk assessment for pending surgery as outlined below.  VIRGLE ARTH was last seen on 05/05/21 by Dr. Lovena Le.  Since that day, VELDON WAGER has done fine from a cardiac standpoint. He has continued going to rehab following his CVA 03/2021. He can complete 4 METs without anginal complaints. Therefore, based on ACC/AHA guidelines, the patient would be at acceptable risk for the planned procedure without further cardiovascular testing. The patient was advised that if he develops new symptoms prior to surgery to contact our office to arrange for a follow-up visit, and he verbalized understanding.Patient is on plavix for recent CVA. Will defer recommendations for holding plavix prior to his upcoming CEA to his neurologist."  Pt stated he was instructed to continue Plavix.   DM2,  last A1c 6.7 on 04/03/2021  Preop labs reviewed, unremarkable.   EKG 04/03/2021: NSR.  Rate 85.  Nonspecific ST and T wave abnormality.  TTE 04/03/21: 1. Left ventricular ejection fraction, by estimation, is 60 to 65%. The  left ventricle has normal function. The left ventricle has no regional  wall  motion abnormalities. Left ventricular diastolic parameters are  indeterminate.  2. Right ventricular systolic function is normal. The right ventricular  size is normal.  3. The mitral valve is normal in structure. No evidence of mitral valve  regurgitation. No evidence of mitral stenosis.  4. The aortic valve is tricuspid. There is mild calcification of the  aortic valve. Aortic valve regurgitation is not visualized. Mild to  moderate aortic valve sclerosis/calcification is present, without any  evidence of aortic stenosis.  5. The inferior vena cava is normal in size with greater than 50%  respiratory variability, suggesting right atrial pressure of 3 mmHg.   Nuclear stress 09/09/18: Nuclear stress EF: 64%. There was no ST segment deviation noted during stress. There is a small defect of mild severity present in the apical inferior location. The defect is non-reversible. In the setting of normal LVF this is consistent with diaphragmatic attenuation artifact. No ischemia noted. The left ventricular ejection fraction is normal (55-65%). This is a low risk study.  )      Anesthesia Quick Evaluation

## 2021-05-17 NOTE — Therapy (Signed)
[facility] [address] [place], [place], [address] Phone: [phone]   Fax:  [phone]  Physical Therapy Recertification  Patient Details  Name: [name] [name] MRN: [id number] Date of Birth: 01-Feb-1941 Referring Provider (PT): Dr. Alysia Penna   Encounter Date: 05/17/2021   PT End of Session - 05/17/21 0944    Visit Number 5    Number of Visits 12    Date for PT Re-Evaluation 07/05/21    Authorization Type [employer]; Recert 8/38/18 to 4/0/37    Authorization Time Period Plan for 4 visits and then assess for discharge    PT Start Time 0935    PT Stop Time 1015    PT Time Calculation (min) 40 min    Equipment Utilized During Treatment Gait belt    Activity Tolerance Patient tolerated treatment well    Behavior During Therapy WFL for tasks assessed/performed           Past Medical History:  Diagnosis Date  . Basal cell carcinoma 11/09/2019   nod & infil-behind right ear-cx3 &exc  . Basal cell carcinoma 03/21/2020   Residual BCC with peripheral margin involved - ST recommends MOHs  . Carotid artery occlusion   . Coronary artery disease    s/p CABG 2005; sees Dr Johnsie Cancel yearly  . Diabetes mellitus without complication ([place])   . Gynecomastia   . Hypercholesterolemia   . Hypertension   . Neurodermatitis   . Overweight(278.02)   . Personal history of colonic polyps 10/08/2006   tubular adenomas  . Renal insufficiency   . Stroke [facility])    TIA  April 2022  . Transient ischemic attack 2010   "lasted ~ 5 seconds"  . Vitamin D deficiency     Past Surgical History:  Procedure Laterality Date  . CARDIAC CATHETERIZATION  02/2004   "tried to stent; couldn't"  . CAROTID ENDARTERECTOMY Right 08/26/2015  . COLONOSCOPY    . CORONARY ANGIOPLASTY    . CORONARY ARTERY BYPASS GRAFT  Feb. 2005   4 vessel  . ENDARTERECTOMY Right 08/26/2015   Procedure: Right Carotid ENDARTERECTOMY with Patch Angioplasty ;  Surgeon:  Rosetta Posner, MD;  Location: [facility];  Service: Vascular;  Laterality: Right;  . EYE SURGERY     bilateral cataract  . 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 Dessie Coma  . KELOID EXCISION    . PILONIDAL CYST EXCISION  1989    There were no vitals filed for this visit.   Subjective Assessment - 05/17/21 0948    Subjective Pt reports I get my carotid artery done tomorrow.    Currently in Pain? No/denies                  Gait training: Resisted walking: black sport cord: 10 x 40 feet lap                     PT Short Term Goals - 05/17/21 0949      PT SHORT TERM GOAL #1   Title Pt will be able to walk 20 min for at least 5 days a week to improve walking endurance.    Baseline No walking    Time 4    Period Weeks    Status Revised    Target Date 06/14/21             PT Long Term Goals - 05/17/21 0955      PT LONG TERM GOAL #  1   Title Pt will demo 5x sit to stand in 16 sec or less to improve functional strength    Baseline 21 sec (04/19/21); 10 sec (05/17/21)    Time 6    Period Weeks    Status Achieved      PT LONG TERM GOAL #2   Title Pt will be I and compliant with walking program to independently manage symptoms at home.    Baseline Walking progrm issued on 04/19/21; pt is compliant with 5 days a week and walking 16 min/day this week (05/17/21)    Time 6    Period Weeks    Status On-going      PT LONG TERM GOAL #3   Title Pt will demo 85 points on LE CVA on FOTO to improve overall function    Baseline 74 (4/20/2)    Time 7    Period Weeks    Status On-going    Target Date 07/05/21      PT LONG TERM GOAL #4   Title Pt will demo 0.83ms walking speed to improve fall risk and community ambulation    Baseline 0.74m (04/19/21) no AD; 0.74 m/s (05/17/21)    Time 7    Period Weeks    Status On-going    Target Date 07/05/21                 Plan - 05/17/21 1013    Clinical Impression Statement Patient has been seen for total  of 5 sessions from 04/19/21 to 05/17/21. Patient has met 2/4 goals. Patient is making significant progress. Patient will continue to benefit from skilled PT to improve strength, gait and balance.    Personal Factors and Comorbidities Age;Time since onset of injury/illness/exacerbation;Past/Current Experience    Examination-Activity Limitations Lift;Stairs;Squat    Examination-Participation Restrictions Church;Community Activity;Driving;Shop;Yard Work;Cleaning    Stability/Clinical Decision Making Stable/Uncomplicated    Rehab Potential Good    PT Frequency 1x / week    PT Duration Other (comment)   7 weeks   PT Treatment/Interventions ADLs/Self Care Home Management;Gait training;Stair training;Functional mobility training;Therapeutic activities;Therapeutic exercise;Balance training;Neuromuscular re-education;Patient/family education;Orthotic Fit/Training;Manual techniques;Passive range of motion;Energy conservation;Joint Manipulations    PT Next Visit Plan Add more sessions.    Consulted and Agree with Plan of Care Patient;Family member/caregiver           Patient will benefit from skilled therapeutic intervention in order to improve the following deficits and impairments:  Abnormal gait,Decreased activity tolerance,Decreased balance,Decreased endurance,Decreased mobility,Difficulty walking,Decreased strength,Postural dysfunction  Visit Diagnosis: 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 Muscle weakness (generalized)  Other abnormalities of gait and mobility     Problem List Patient Active Problem List   Diagnosis Date Noted  . 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[facility]04/06/2021  . TIA (transient ischemic attack) 04/03/2021  . Lower extremity edema 08/13/2019  . Unsteady gait 06/09/2019  . Preventative health care 06/09/2019  . Hyperlipidemia 01/30/2016  . Carotid stenosis 08/26/2015  . 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 Carotid artery disease ([facility]07/02/2012  . Special screening for malignant  neoplasm of prostate 05/08/2011  . Vitamin D deficiency 11/20/2009  . Transient cerebral ischemia 06/30/2009  . CEREBROVASCULAR DISEASE 06/30/2009  . COLONIC POLYPS 01/10/2008  . Essential hypertension 01/10/2008  . Coronary atherosclerosis 01/10/2008  . 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 GYNECOMASTIA, UNILATERAL 01/10/2008  . 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 SHOULDER PAIN 01/10/2008  . Type 2 diabetes mellitus ([facility] [place]01/09/2008    KaKerrie PleasurePT 05/17/2021, 10:18 AM  [facility] [address] [place], [place], [address] Phone: [phone]   Fax:  [phone]  Name: [name] [name] MRN: [id number] Date of Birth: 08/10/1941

## 2021-05-18 ENCOUNTER — Inpatient Hospital Stay (HOSPITAL_COMMUNITY)
Admission: RE | Admit: 2021-05-18 | Discharge: 2021-05-19 | DRG: 039 | Disposition: A | Discharge status: Home / Self Care | Payer: Medicare PPO | Attending: Vascular Surgery | Admitting: Vascular Surgery

## 2021-05-18 ENCOUNTER — Encounter (HOSPITAL_COMMUNITY)
Admission: RE | Disposition: A | Discharge status: Home / Self Care | Payer: Self-pay | Source: Home / Self Care | Attending: Vascular Surgery

## 2021-05-18 ENCOUNTER — Inpatient Hospital Stay (HOSPITAL_COMMUNITY): Payer: Medicare PPO | Admitting: Physician Assistant

## 2021-05-18 ENCOUNTER — Other Ambulatory Visit: Payer: Self-pay

## 2021-05-18 ENCOUNTER — Inpatient Hospital Stay (HOSPITAL_COMMUNITY): Payer: Medicare PPO | Admitting: Anesthesiology

## 2021-05-18 ENCOUNTER — Encounter (HOSPITAL_COMMUNITY): Payer: Self-pay | Admitting: Vascular Surgery

## 2021-05-18 DIAGNOSIS — Z841 Family history of disorders of kidney and ureter: Secondary | ICD-10-CM

## 2021-05-18 DIAGNOSIS — I10 Essential (primary) hypertension: Secondary | ICD-10-CM | POA: Diagnosis present

## 2021-05-18 DIAGNOSIS — Z79899 Other long term (current) drug therapy: Secondary | ICD-10-CM | POA: Diagnosis not present

## 2021-05-18 DIAGNOSIS — I69398 Other sequelae of cerebral infarction: Secondary | ICD-10-CM | POA: Diagnosis not present

## 2021-05-18 DIAGNOSIS — R2689 Other abnormalities of gait and mobility: Secondary | ICD-10-CM | POA: Diagnosis present

## 2021-05-18 DIAGNOSIS — Z8249 Family history of ischemic heart disease and other diseases of the circulatory system: Secondary | ICD-10-CM | POA: Diagnosis not present

## 2021-05-18 DIAGNOSIS — I2510 Atherosclerotic heart disease of native coronary artery without angina pectoris: Secondary | ICD-10-CM | POA: Diagnosis present

## 2021-05-18 DIAGNOSIS — Z951 Presence of aortocoronary bypass graft: Secondary | ICD-10-CM

## 2021-05-18 DIAGNOSIS — Z888 Allergy status to other drugs, medicaments and biological substances status: Secondary | ICD-10-CM

## 2021-05-18 DIAGNOSIS — Z20822 Contact with and (suspected) exposure to covid-19: Secondary | ICD-10-CM | POA: Diagnosis present

## 2021-05-18 DIAGNOSIS — E119 Type 2 diabetes mellitus without complications: Secondary | ICD-10-CM | POA: Diagnosis present

## 2021-05-18 DIAGNOSIS — Z7982 Long term (current) use of aspirin: Secondary | ICD-10-CM

## 2021-05-18 DIAGNOSIS — Z85828 Personal history of other malignant neoplasm of skin: Secondary | ICD-10-CM

## 2021-05-18 DIAGNOSIS — Z7902 Long term (current) use of antithrombotics/antiplatelets: Secondary | ICD-10-CM

## 2021-05-18 DIAGNOSIS — Z801 Family history of malignant neoplasm of trachea, bronchus and lung: Secondary | ICD-10-CM

## 2021-05-18 DIAGNOSIS — E559 Vitamin D deficiency, unspecified: Secondary | ICD-10-CM | POA: Diagnosis present

## 2021-05-18 DIAGNOSIS — E7800 Pure hypercholesterolemia, unspecified: Secondary | ICD-10-CM | POA: Diagnosis present

## 2021-05-18 DIAGNOSIS — I6522 Occlusion and stenosis of left carotid artery: Secondary | ICD-10-CM | POA: Diagnosis present

## 2021-05-18 DIAGNOSIS — Z833 Family history of diabetes mellitus: Secondary | ICD-10-CM | POA: Diagnosis not present

## 2021-05-18 HISTORY — PX: ENDARTERECTOMY: SHX5162

## 2021-05-18 LAB — GLUCOSE, CAPILLARY
Glucose-Capillary: 129 mg/dL — ABNORMAL HIGH (ref 70–99)
Glucose-Capillary: 144 mg/dL — ABNORMAL HIGH (ref 70–99)
Glucose-Capillary: 252 mg/dL — ABNORMAL HIGH (ref 70–99)
Glucose-Capillary: 246 mg/dL — ABNORMAL HIGH (ref 70–99)

## 2021-05-18 SURGERY — ENDARTERECTOMY, CAROTID
Anesthesia: General | Site: Neck | Laterality: Left

## 2021-05-18 MED ORDER — ROCURONIUM BROMIDE 10 MG/ML (PF) SYRINGE
PREFILLED_SYRINGE | INTRAVENOUS | Status: AC
  Filled 2021-05-18: qty 10

## 2021-05-18 MED ORDER — ESMOLOL HCL 100 MG/10ML IV SOLN
INTRAVENOUS | Status: DC | PRN
  Administered 2021-05-18: 30 mg via INTRAVENOUS
  Administered 2021-05-18: 40 mg via INTRAVENOUS

## 2021-05-18 MED ORDER — ONDANSETRON HCL 4 MG/2ML IJ SOLN
INTRAMUSCULAR | Status: AC
  Filled 2021-05-18: qty 2

## 2021-05-18 MED ORDER — LOSARTAN POTASSIUM 50 MG PO TABS
50.0000 mg | ORAL_TABLET | Freq: Every day | ORAL | Status: DC
  Filled 2021-05-18: qty 1

## 2021-05-18 MED ORDER — LIDOCAINE 2% (20 MG/ML) 5 ML SYRINGE
INTRAMUSCULAR | Status: AC
  Filled 2021-05-18: qty 5

## 2021-05-18 MED ORDER — SODIUM CHLORIDE 0.9 % IV SOLN
0.0125 ug/kg/min | INTRAVENOUS | Status: AC
  Administered 2021-05-18: .2 ug/kg/min via INTRAVENOUS
  Filled 2021-05-18 (×2): qty 1000

## 2021-05-18 MED ORDER — ESMOLOL HCL 100 MG/10ML IV SOLN
INTRAVENOUS | Status: AC
  Filled 2021-05-18: qty 10

## 2021-05-18 MED ORDER — GLYCOPYRROLATE PF 0.2 MG/ML IJ SOSY
PREFILLED_SYRINGE | INTRAMUSCULAR | Status: AC
  Filled 2021-05-18: qty 1

## 2021-05-18 MED ORDER — SODIUM CHLORIDE 0.9 % IV SOLN: INTRAVENOUS | Status: DC

## 2021-05-18 MED ORDER — PROPOFOL 10 MG/ML IV BOLUS
INTRAVENOUS | Status: DC | PRN
  Administered 2021-05-18: 100 mg via INTRAVENOUS

## 2021-05-18 MED ORDER — ASPIRIN EC 81 MG PO TBEC
81.0000 mg | DELAYED_RELEASE_TABLET | Freq: Every day | ORAL | Status: DC
  Administered 2021-05-19: 81 mg via ORAL
  Filled 2021-05-18: qty 1

## 2021-05-18 MED ORDER — ONDANSETRON HCL 4 MG/2ML IJ SOLN: 4.0000 mg | Freq: Four times a day (QID) | INTRAMUSCULAR | Status: DC | PRN

## 2021-05-18 MED ORDER — CHLORHEXIDINE GLUCONATE 0.12 % MT SOLN
OROMUCOSAL | Status: AC
  Administered 2021-05-18: 15 mL
  Filled 2021-05-18: qty 15

## 2021-05-18 MED ORDER — ALUM & MAG HYDROXIDE-SIMETH 200-200-20 MG/5ML PO SUSP: 15.0000 mL | ORAL | Status: DC | PRN

## 2021-05-18 MED ORDER — SODIUM CHLORIDE 0.9 % IV SOLN: 500.0000 mL | Freq: Once | INTRAVENOUS | Status: DC | PRN

## 2021-05-18 MED ORDER — ACETAMINOPHEN 500 MG PO TABS: 1000.0000 mg | ORAL_TABLET | Freq: Once | ORAL | Status: AC

## 2021-05-18 MED ORDER — HEPARIN SODIUM (PORCINE) 1000 UNIT/ML IJ SOLN
INTRAMUSCULAR | Status: DC | PRN
  Administered 2021-05-18: 10000 [IU] via INTRAVENOUS

## 2021-05-18 MED ORDER — ACETAMINOPHEN 650 MG RE SUPP
325.0000 mg | RECTAL | Status: DC | PRN
Start: 2021-05-18 — End: 2021-05-19

## 2021-05-18 MED ORDER — LIDOCAINE 2% (20 MG/ML) 5 ML SYRINGE
INTRAMUSCULAR | Status: DC | PRN
  Administered 2021-05-18: 60 mg via INTRAVENOUS

## 2021-05-18 MED ORDER — LIDOCAINE HCL (PF) 1 % IJ SOLN
INTRAMUSCULAR | Status: AC
  Filled 2021-05-18: qty 5

## 2021-05-18 MED ORDER — SUGAMMADEX SODIUM 200 MG/2ML IV SOLN
INTRAVENOUS | Status: DC | PRN
  Administered 2021-05-18: 200 mg via INTRAVENOUS

## 2021-05-18 MED ORDER — SODIUM CHLORIDE 0.9 % IV SOLN
0.0125 ug/kg/min | INTRAVENOUS | Status: AC
  Administered 2021-05-18: .2 ug/kg/min via INTRAVENOUS
  Filled 2021-05-18: qty 2000

## 2021-05-18 MED ORDER — MAGNESIUM SULFATE 2 GM/50ML IV SOLN: 2.0000 g | Freq: Every day | INTRAVENOUS | Status: DC | PRN

## 2021-05-18 MED ORDER — METOPROLOL TARTRATE 5 MG/5ML IV SOLN: 2.0000 mg | INTRAVENOUS | Status: DC | PRN

## 2021-05-18 MED ORDER — PROPOFOL 10 MG/ML IV BOLUS
INTRAVENOUS | Status: AC
  Filled 2021-05-18: qty 20

## 2021-05-18 MED ORDER — LACTATED RINGERS IV SOLN: INTRAVENOUS | Status: DC | PRN

## 2021-05-18 MED ORDER — 0.9 % SODIUM CHLORIDE (POUR BTL) OPTIME
TOPICAL | Status: DC | PRN
  Administered 2021-05-18: 2000 mL

## 2021-05-18 MED ORDER — PHENOL 1.4 % MT LIQD: 1.0000 | OROMUCOSAL | Status: DC | PRN

## 2021-05-18 MED ORDER — HYDROMORPHONE HCL 1 MG/ML IJ SOLN: 0.5000 mg | INTRAMUSCULAR | Status: DC | PRN

## 2021-05-18 MED ORDER — CARVEDILOL 3.125 MG PO TABS
3.1250 mg | ORAL_TABLET | Freq: Two times a day (BID) | ORAL | Status: DC
  Administered 2021-05-18: 3.125 mg via ORAL
  Filled 2021-05-18 (×2): qty 1

## 2021-05-18 MED ORDER — PHENYLEPHRINE HCL-NACL 10-0.9 MG/250ML-% IV SOLN
INTRAVENOUS | Status: DC | PRN
  Administered 2021-05-18: 25 ug/min via INTRAVENOUS

## 2021-05-18 MED ORDER — ACETAMINOPHEN 325 MG PO TABS: 325.0000 mg | ORAL_TABLET | ORAL | Status: DC | PRN

## 2021-05-18 MED ORDER — SENNOSIDES-DOCUSATE SODIUM 8.6-50 MG PO TABS: 1.0000 | ORAL_TABLET | Freq: Every evening | ORAL | Status: DC | PRN

## 2021-05-18 MED ORDER — CEFAZOLIN SODIUM-DEXTROSE 2-4 GM/100ML-% IV SOLN
2.0000 g | Freq: Three times a day (TID) | INTRAVENOUS | Status: AC
  Administered 2021-05-18 – 2021-05-19 (×2): 2 g via INTRAVENOUS
  Filled 2021-05-18 (×2): qty 100

## 2021-05-18 MED ORDER — CHLORHEXIDINE GLUCONATE CLOTH 2 % EX PADS: 6.0000 | MEDICATED_PAD | Freq: Once | CUTANEOUS | Status: DC

## 2021-05-18 MED ORDER — CEFAZOLIN SODIUM-DEXTROSE 2-4 GM/100ML-% IV SOLN
2.0000 g | INTRAVENOUS | Status: AC
  Administered 2021-05-18: 2 g via INTRAVENOUS

## 2021-05-18 MED ORDER — HEPARIN SODIUM (PORCINE) 1000 UNIT/ML IJ SOLN
INTRAMUSCULAR | Status: AC
  Filled 2021-05-18: qty 1

## 2021-05-18 MED ORDER — ROCURONIUM BROMIDE 10 MG/ML (PF) SYRINGE
PREFILLED_SYRINGE | INTRAVENOUS | Status: DC | PRN
  Administered 2021-05-18: 60 mg via INTRAVENOUS

## 2021-05-18 MED ORDER — DEXAMETHASONE SODIUM PHOSPHATE 10 MG/ML IJ SOLN
INTRAMUSCULAR | Status: AC
  Filled 2021-05-18: qty 1

## 2021-05-18 MED ORDER — OXYCODONE-ACETAMINOPHEN 5-325 MG PO TABS
1.0000 | ORAL_TABLET | ORAL | Status: DC | PRN
Start: 2021-05-18 — End: 2021-05-19

## 2021-05-18 MED ORDER — CLOPIDOGREL BISULFATE 75 MG PO TABS
75.0000 mg | ORAL_TABLET | Freq: Every day | ORAL | Status: DC
  Filled 2021-05-18: qty 1

## 2021-05-18 MED ORDER — DEXAMETHASONE SODIUM PHOSPHATE 10 MG/ML IJ SOLN
INTRAMUSCULAR | Status: DC | PRN
  Administered 2021-05-18: 10 mg via INTRAVENOUS

## 2021-05-18 MED ORDER — EPHEDRINE 5 MG/ML INJ
INTRAVENOUS | Status: AC
  Filled 2021-05-18: qty 10

## 2021-05-18 MED ORDER — FENTANYL CITRATE (PF) 250 MCG/5ML IJ SOLN
INTRAMUSCULAR | Status: AC
  Filled 2021-05-18: qty 5

## 2021-05-18 MED ORDER — PROTAMINE SULFATE 10 MG/ML IV SOLN
INTRAVENOUS | Status: AC
  Filled 2021-05-18: qty 5

## 2021-05-18 MED ORDER — SODIUM CHLORIDE 0.9 % IV SOLN
INTRAVENOUS | Status: DC | PRN
  Administered 2021-05-18: 500 mL

## 2021-05-18 MED ORDER — CEFAZOLIN SODIUM-DEXTROSE 2-4 GM/100ML-% IV SOLN
INTRAVENOUS | Status: AC
  Filled 2021-05-18: qty 100

## 2021-05-18 MED ORDER — GLYCOPYRROLATE PF 0.2 MG/ML IJ SOSY
PREFILLED_SYRINGE | INTRAMUSCULAR | Status: DC | PRN
  Administered 2021-05-18: .2 mg via INTRAVENOUS

## 2021-05-18 MED ORDER — LABETALOL HCL 5 MG/ML IV SOLN: 10.0000 mg | INTRAVENOUS | Status: DC | PRN

## 2021-05-18 MED ORDER — PROTAMINE SULFATE 10 MG/ML IV SOLN
INTRAVENOUS | Status: DC | PRN
  Administered 2021-05-18: 10 mg via INTRAVENOUS
  Administered 2021-05-18 (×2): 20 mg via INTRAVENOUS

## 2021-05-18 MED ORDER — GUAIFENESIN-DM 100-10 MG/5ML PO SYRP: 15.0000 mL | ORAL_SOLUTION | ORAL | Status: DC | PRN

## 2021-05-18 MED ORDER — ACETAMINOPHEN 500 MG PO TABS
ORAL_TABLET | ORAL | Status: AC
  Administered 2021-05-18: 1000 mg via ORAL
  Filled 2021-05-18: qty 2

## 2021-05-18 MED ORDER — POTASSIUM CHLORIDE CRYS ER 20 MEQ PO TBCR: 20.0000 meq | EXTENDED_RELEASE_TABLET | Freq: Every day | ORAL | Status: DC | PRN

## 2021-05-18 MED ORDER — HYDRALAZINE HCL 20 MG/ML IJ SOLN
5.0000 mg | INTRAMUSCULAR | Status: DC | PRN
Start: 2021-05-18 — End: 2021-05-19

## 2021-05-18 MED ORDER — DOCUSATE SODIUM 100 MG PO CAPS
100.0000 mg | ORAL_CAPSULE | Freq: Every day | ORAL | Status: DC
  Filled 2021-05-18: qty 1

## 2021-05-18 MED ORDER — HEPARIN SODIUM (PORCINE) 5000 UNIT/ML IJ SOLN
5000.0000 [IU] | Freq: Three times a day (TID) | INTRAMUSCULAR | Status: DC
  Administered 2021-05-19: 5000 [IU] via SUBCUTANEOUS
  Filled 2021-05-18: qty 1

## 2021-05-18 MED ORDER — PANTOPRAZOLE SODIUM 40 MG PO TBEC
40.0000 mg | DELAYED_RELEASE_TABLET | Freq: Every day | ORAL | Status: DC
  Administered 2021-05-18: 40 mg via ORAL
  Filled 2021-05-18: qty 1

## 2021-05-18 MED ORDER — HYDROMORPHONE HCL 1 MG/ML IJ SOLN: 0.2500 mg | INTRAMUSCULAR | Status: DC | PRN

## 2021-05-18 MED ORDER — BISACODYL 5 MG PO TBEC: 5.0000 mg | DELAYED_RELEASE_TABLET | Freq: Every day | ORAL | Status: DC | PRN

## 2021-05-18 MED ORDER — ONDANSETRON HCL 4 MG/2ML IJ SOLN
INTRAMUSCULAR | Status: DC | PRN
  Administered 2021-05-18: 4 mg via INTRAVENOUS

## 2021-05-18 MED ORDER — ATORVASTATIN CALCIUM 40 MG PO TABS
40.0000 mg | ORAL_TABLET | Freq: Every day | ORAL | Status: DC
  Filled 2021-05-18: qty 1

## 2021-05-18 MED ORDER — EPHEDRINE SULFATE-NACL 50-0.9 MG/10ML-% IV SOSY
PREFILLED_SYRINGE | INTRAVENOUS | Status: DC | PRN
  Administered 2021-05-18: 5 mg via INTRAVENOUS

## 2021-05-18 MED ORDER — SODIUM CHLORIDE 0.9 % IV SOLN
INTRAVENOUS | Status: AC
  Filled 2021-05-18: qty 1.2

## 2021-05-18 SURGICAL SUPPLY — 40 items
CANISTER SUCT 3000ML PPV (MISCELLANEOUS) ×2 IMPLANT
CANNULA VESSEL 3MM 2 BLNT TIP (CANNULA) ×4 IMPLANT
CATH ROBINSON RED A/P 18FR (CATHETERS) ×2 IMPLANT
CLIP LIGATING EXTRA MED SLVR (CLIP) ×2 IMPLANT
CLIP LIGATING EXTRA SM BLUE (MISCELLANEOUS) ×2 IMPLANT
COVER WAND RF STERILE (DRAPES) IMPLANT
DECANTER SPIKE VIAL GLASS SM (MISCELLANEOUS) IMPLANT
DERMABOND ADVANCED (GAUZE/BANDAGES/DRESSINGS) ×1
DERMABOND ADVANCED .7 DNX12 (GAUZE/BANDAGES/DRESSINGS) ×1 IMPLANT
DRAIN CHANNEL 10F 3/8 F FF (DRAIN) IMPLANT
DRAIN HEMOVAC 1/8 X 5 (WOUND CARE) IMPLANT
ELECT REM PT RETURN 9FT ADLT (ELECTROSURGICAL) ×2
ELECTRODE REM PT RTRN 9FT ADLT (ELECTROSURGICAL) ×1 IMPLANT
EVACUATOR SILICONE 100CC (DRAIN) IMPLANT
GLOVE SS BIOGEL STRL SZ 7.5 (GLOVE) ×1 IMPLANT
GLOVE SUPERSENSE BIOGEL SZ 7.5 (GLOVE) ×1
GOWN STRL REUS W/ TWL LRG LVL3 (GOWN DISPOSABLE) ×3 IMPLANT
GOWN STRL REUS W/TWL LRG LVL3 (GOWN DISPOSABLE) ×6
KIT BASIN OR (CUSTOM PROCEDURE TRAY) ×2 IMPLANT
KIT SHUNT ARGYLE CAROTID ART 6 (VASCULAR PRODUCTS) ×2 IMPLANT
KIT TURNOVER KIT B (KITS) ×2 IMPLANT
LOOP VESSEL MAXI BLUE (MISCELLANEOUS) ×2 IMPLANT
NEEDLE 22X1 1/2 (OR ONLY) (NEEDLE) IMPLANT
NS IRRIG 1000ML POUR BTL (IV SOLUTION) ×4 IMPLANT
PACK CAROTID (CUSTOM PROCEDURE TRAY) ×2 IMPLANT
PAD ARMBOARD 7.5X6 YLW CONV (MISCELLANEOUS) ×4 IMPLANT
PATCH HEMASHIELD 8X75 (Vascular Products) ×2 IMPLANT
POSITIONER HEAD DONUT 9IN (MISCELLANEOUS) ×2 IMPLANT
SHUNT CAROTID BYPASS 10 (VASCULAR PRODUCTS) IMPLANT
SHUNT CAROTID BYPASS 12FRX15.5 (VASCULAR PRODUCTS) IMPLANT
SUT ETHILON 3 0 PS 1 (SUTURE) IMPLANT
SUT PROLENE 6 0 CC (SUTURE) ×6 IMPLANT
SUT SILK 3 0 (SUTURE)
SUT SILK 3-0 18XBRD TIE 12 (SUTURE) IMPLANT
SUT VIC AB 3-0 SH 27 (SUTURE) ×4
SUT VIC AB 3-0 SH 27X BRD (SUTURE) ×2 IMPLANT
SUT VICRYL 4-0 PS2 18IN ABS (SUTURE) ×2 IMPLANT
SYR CONTROL 10ML LL (SYRINGE) IMPLANT
TOWEL GREEN STERILE (TOWEL DISPOSABLE) ×2 IMPLANT
WATER STERILE IRR 1000ML POUR (IV SOLUTION) ×2 IMPLANT

## 2021-05-18 NOTE — Anesthesia Postprocedure Evaluation (Signed)
Anesthesia Post Note  Patient: [name] [name]  Procedure(s) Performed: LEFT CAROTID ARTERY ENDARTERECTOMY  with patch angioplasty (Left Neck)     Patient location during evaluation: PACU Anesthesia Type: General Level of consciousness: awake and alert Pain management: pain level controlled Vital Signs Assessment: post-procedure vital signs reviewed and stable Respiratory status: spontaneous breathing, nonlabored ventilation, respiratory function stable and patient connected to nasal cannula oxygen Cardiovascular status: blood pressure returned to baseline and stable Postop Assessment: no apparent nausea or vomiting Anesthetic complications: no   No complications documented.  Last Vitals:  Vitals:   05/18/21 1135 05/18/21 1150  BP: (!) 146/64 (!) 151/92  Pulse: 73 78  Resp: (!) 26 (!) 25  Temp:    SpO2: 97% 98%    Last Pain:  Vitals:   05/18/21 1150  TempSrc:   PainSc: 0-No pain                 Makina Skow,W. EDMOND

## 2021-05-18 NOTE — Progress Notes (Signed)
Dr. Suann Larry aware of elevated BP's.  No orders received.

## 2021-05-18 NOTE — Anesthesia Procedure Notes (Signed)
Procedure Name: Intubation Date/Time: 05/18/2021 7:55 AM Performed by: Harden Mo, CRNA Pre-anesthesia Checklist: Patient identified, Emergency Drugs available, Suction available and Patient being monitored Patient Re-evaluated:Patient Re-evaluated prior to induction Oxygen Delivery Method: Circle System Utilized Preoxygenation: Pre-oxygenation with 100% oxygen Induction Type: IV induction Ventilation: Mask ventilation without difficulty and Oral airway inserted - appropriate to patient size Laryngoscope Size: Sabra Heck and 2 Grade View: Grade I Tube type: Oral Tube size: 7.5 mm Number of attempts: 1 Airway Equipment and Method: Stylet and Oral airway Placement Confirmation: ETT inserted through vocal cords under direct vision,  positive ETCO2 and breath sounds checked- equal and bilateral Secured at: 22 cm Tube secured with: Tape Dental Injury: Teeth and Oropharynx as per pre-operative assessment

## 2021-05-18 NOTE — Op Note (Signed)
   OPERATIVE REPORT  DATE OF SURGERY: 05/18/2021  PATIENT: [name] [name], 80 y.o. male MRN: [id number]  DOB: 1941-05-21  PRE-OPERATIVE DIAGNOSIS: Left Carotid Stenosis, Asymptomatic  POST-OPERATIVE DIAGNOSIS:  Same  PROCEDURE:  Left Carotid Endarterectomy with Dacron Patch Angioplasty  SURGEON:  Curt Jews, M.D.  PHYSICIAN ASSISTANT: Baglia  The assistant was needed for exposure and to expedite the case  ANESTHESIA:   general  EBL: Less than 200 ml  Total I/O In: 1350 [I.V.:1250; IV Piggyback:100] Out: 300 [Blood:300]  BLOOD ADMINISTERED: none  DRAINS: none   SPECIMEN: none  COUNTS CORRECT:  YES  PLAN OF CARE: Admit to inpatient   PATIENT DISPOSITION:  PACU - hemodynamically stable and neurologically intact.  PROCEDURE DETAILS: The patient was taken to the operating room placed in supine position.  General anesthesia was administered.  The neck was prepped and draped in the usual sterile fashion.  An incision was made anterior to the sternocleidomastoid and carried down through the platysma with electrocautery.  The sternocleidomastoid was reflected posteriorly and the carotid sheath was opened.  The facial vein was ligated with 2-0 silk ties and divided.  The common carotid artery was encircled with an umbilical tape and Rummel tourniquet.  The vagus nerve was identified and preserved.  Dissection was continued onto the carotid bifurcation.  The superior thyroid artery was encircled with a 2-0 silk Potts tie.  The external carotid was encircled with a blue vessel loop and the internal carotid was encircled with an umbilical tape and Rummel tourniquet.  The hypoglossal nerve was identified and preserved.  The patient was given systemic heparin and after adequate circulation time, the internal, external and common carotid arteries were occluded with vascular clamps.  The common carotid artery was opened with an 11 blade and extended  longitudinally with Potts scissors.  A 10  shunt was passed up the internal carotid and allowed to backbleed.  It was then passed down the common carotid where it was secured with Rummel tourniquet.  The endarterectomy was begun on the common carotid artery and the plaque was divided proximally with Potts scissors.  The endarterectomy was continued onto the bifurcation.  The external carotid was endarterectomized with an eversion technique and the internal carotid was endarterectomized in an open fashion.  Remaining atheromatous debris was removed from the endarterectomy plane.  A Finesse Hemashield Dacron patch was brought onto the field and was sewn as a patch angioplasty with a running 6-0 Prolene suture.  Prior to completion of the closure the shunt was removed and the usual flushing maneuvers were undertaken.  The anastomosis was completed and flow was restored first to the external and then the internal carotid artery.  Excellent flow characteristics were noted with hand-held Doppler in the internal and external carotid arteries.  The patient was given 50 mg of protamine to reverse the heparin.  The wounds were irrigated with saline.  Hemostasis was obtained with electrocautery.  The wounds were closed with 3-0 Vicryl to reapproximate the sternocleidomastoid over the carotid sheath.  The platysma was lysed with a running 3-0 Vicryl suture.  The skin was closed with a 4-0 subcuticular Vicryl stitch.  Dermabond was applied.  The patient was awakened neurologically intact in the operating room and transferred to the recovery room in stable condition   Curt Jews, M.D. 05/18/2021 10:16 AM

## 2021-05-18 NOTE — Progress Notes (Signed)
Patient ID: [name] [name], male   DOB: 1941/07/30, 80 y.o.   MRN: [id number] The patient is asking this morning regarding any limitations for physical therapy.  He is still undergoing gait training related to his prior stroke.  His next session is in 6 days.  He is able to return to physical therapy with no restriction from his current regimen.  Would not do any heavy weight training for 2 weeks from surgery.  He is currently not engaged in this.

## 2021-05-18 NOTE — Interval H&P Note (Signed)
History and Physical Interval Note:  05/18/2021 6:53 AM  [name] [name]  has presented today for surgery, with the diagnosis of Left Carotid Artery Stenosis.  The various methods of treatment have been discussed with the patient and family. After consideration of risks, benefits and other options for treatment, the patient has consented to  Procedure(s): LEFT CAROTID ARTERY ENDARTERECTOMY (Left) as a surgical intervention.  The patient's history has been reviewed, patient examined, no change in status, stable for surgery.  I have reviewed the patient's chart and labs.  Questions were answered to the patient's satisfaction.     Curt Jews

## 2021-05-18 NOTE — Discharge Instructions (Signed)
   [facility]  Discharge Instructions   Carotid Surgery  Please refer to the following instructions for your post-procedure care. Your surgeon or physician assistant will discuss any changes with you.  Activity  You are encouraged to walk as much as you can. You can slowly return to normal activities but must avoid strenuous activity and heavy lifting until your doctor tell you it's okay. Avoid activities such as vacuuming or swinging a golf club. You can drive after one week if you are comfortable and you are no longer taking prescription pain medications. It is normal to feel tired for serval weeks after your surgery. It is also normal to have difficulty with sleep habits, eating, and bowel movements after surgery. These will go away with time.  Bathing/Showering  Shower daily after you go home. Do not soak in a bathtub, hot tub, or swim until the incision heals completely.  Incision Care  Shower every day. Clean your incision with mild soap and water. Pat the area dry with a clean towel. You do not need a bandage unless otherwise instructed. Do not apply any ointments or creams to your incision. You may have skin glue on your incision. Do not peel it off. It will come off on its own in about one week. Your incision may feel thickened and raised for several weeks after your surgery. This is normal and the skin will soften over time.   For Men Only: It's okay to shave around the incision but do not shave the incision itself for 2 weeks. It is common to have numbness under your chin that could last for several months.  Diet  Resume your normal diet. There are no special food restrictions following this procedure. A low fat/low cholesterol diet is recommended for all patients with vascular disease. In order to heal from your surgery, it is CRITICAL to get adequate nutrition. Your body requires vitamins, minerals, and protein. Vegetables are the best source of  vitamins and minerals. Vegetables also provide the perfect balance of protein. Processed food has little nutritional value, so try to avoid this.  Medications  Resume taking all of your medications unless your doctor or physician assistant tells you not to. If your incision is causing pain, you may take over-the- counter pain relievers such as acetaminophen (Tylenol). If you were prescribed a stronger pain medication, please be aware these medications can cause nausea and constipation. Prevent nausea by taking the medication with a snack or meal. Avoid constipation by drinking plenty of fluids and eating foods with a high amount of fiber, such as fruits, vegetables, and grains.   Do not take Tylenol if you are taking prescription pain medications.  Follow Up  Our office will schedule a follow up appointment 2-3 weeks following discharge.  Please call us immediately for any of the following conditions  . Increased pain, redness, drainage (pus) from your incision site. . Fever of 101 degrees or higher. . If you should develop stroke (slurred speech, difficulty swallowing, weakness on one side of your body, loss of vision) you should call 911 and go to the nearest emergency room. .  Reduce your risk of vascular disease:  . Stop smoking. If you would like help call [facility] at 1-800-QUIT-NOW (1-800-784-8669) or [facility] at 336-586-4000. . Manage your cholesterol . Maintain a desired weight . Control your diabetes . Keep your blood pressure down .  If you have any questions, please call the office at [phone]. 

## 2021-05-18 NOTE — Anesthesia Procedure Notes (Signed)
Arterial Line Insertion Start/End5/19/2022 7:19 AM, 05/18/2021 7:24 AM Performed by: Roderic Palau, MD  Patient location: Pre-op. Preanesthetic checklist: patient identified, IV checked, site marked, risks and benefits discussed, surgical consent, monitors and equipment checked, pre-op evaluation, timeout performed and anesthesia consent Lidocaine 1% used for infiltration Left, brachial was placed Catheter size: 20 Fr Hand hygiene performed  and maximum sterile barriers used   Attempts: 1 Procedure performed using ultrasound guided technique. Ultrasound Notes:anatomy identified, needle tip was noted to be adjacent to the nerve/plexus identified, no ultrasound evidence of intravascular and/or intraneural injection and image(s) printed for medical record Following insertion, dressing applied, Biopatch and line sutured. Post procedure assessment: normal and unchanged  Post procedure complications: second provider assisted and unsuccessful attempts (Crna attempted radial x 2.). Patient tolerated the procedure well with no immediate complications.

## 2021-05-18 NOTE — Transfer of Care (Signed)
Immediate Anesthesia Transfer of Care Note  Patient: [name] [name]  Procedure(s) Performed: LEFT CAROTID ARTERY ENDARTERECTOMY  with patch angioplasty (Left Neck)  Patient Location: PACU  Anesthesia Type:General  Level of Consciousness: awake, alert  and oriented  Airway & Oxygen Therapy: Patient Spontanous Breathing and Patient connected to face mask oxygen  Post-op Assessment: Report given to RN, Post -op Vital signs reviewed and stable, Patient moving all extremities X 4 and Patient able to stick tongue midline  Post vital signs: Reviewed and stable  Last Vitals:  Vitals Value Taken Time  BP    Temp    Pulse 95 05/18/21 1018  Resp 25 05/18/21 1018  SpO2 93 % 05/18/21 1018  Vitals shown include unvalidated device data.  Last Pain:  Vitals:   05/18/21 0633  TempSrc:   PainSc: 0-No pain         Complications: No complications documented.

## 2021-05-18 NOTE — Progress Notes (Signed)
Pt arrived to rm 4 from PACU on 5L 02. Initiated tele. CHG wipe given. Oriented to the unit. Call bell within reach. VSS.  Lavenia Atlas, RN

## 2021-05-19 ENCOUNTER — Encounter (HOSPITAL_COMMUNITY): Payer: Self-pay | Admitting: Vascular Surgery

## 2021-05-19 LAB — BASIC METABOLIC PANEL
Anion gap: 5 (ref 5–15)
BUN: 19 mg/dL (ref 8–23)
CO2: 26 mmol/L (ref 22–32)
Calcium: 8.4 mg/dL — ABNORMAL LOW (ref 8.9–10.3)
Chloride: 104 mmol/L (ref 98–111)
Creatinine, Ser: 1.17 mg/dL (ref 0.61–1.24)
GFR, Estimated: >60 mL/min (ref >60)
Glucose, Bld: 136 mg/dL — ABNORMAL HIGH (ref 70–99)
Potassium: 4.4 mmol/L (ref 3.5–5.1)
Sodium: 135 mmol/L (ref 135–145)

## 2021-05-19 LAB — CBC
HCT: 40 % (ref 39.0–52.0)
Hemoglobin: 14.2 g/dL (ref 13.0–17.0)
MCH: 35.1 pg — ABNORMAL HIGH (ref 26.0–34.0)
MCHC: 35.5 g/dL (ref 30.0–36.0)
MCV: 99 fL (ref 80.0–100.0)
Platelets: 178 10*3/uL (ref 150–400)
RBC: 4.04 MIL/uL — ABNORMAL LOW (ref 4.22–5.81)
RDW: 12.7 % (ref 11.5–15.5)
WBC: 15.2 10*3/uL — ABNORMAL HIGH (ref 4.0–10.5)
nRBC: 0 % (ref 0.0–0.2)

## 2021-05-19 LAB — LIPID PANEL
Cholesterol: 88 mg/dL (ref 0–200)
HDL: 28 mg/dL — ABNORMAL LOW (ref >40)
LDL Cholesterol: 46 mg/dL (ref 0–99)
Total CHOL/HDL Ratio: 3.1 RATIO
Triglycerides: 69 mg/dL (ref <150)
VLDL: 14 mg/dL (ref 0–40)

## 2021-05-19 LAB — GLUCOSE, CAPILLARY: Glucose-Capillary: 148 mg/dL — ABNORMAL HIGH (ref 70–99)

## 2021-05-19 LAB — HEMOGLOBIN A1C
Hgb A1c MFr Bld: 6.5 % — ABNORMAL HIGH (ref 4.8–5.6)
Mean Plasma Glucose: 139.85 mg/dL

## 2021-05-19 MED ORDER — HYDROCODONE-ACETAMINOPHEN 5-325 MG PO TABS: 1.0000 | ORAL_TABLET | ORAL | 0 refills | Status: DC | PRN

## 2021-05-19 NOTE — Progress Notes (Addendum)
  Progress Note    05/19/2021 7:29 AM 1 Day Post-Op  Subjective:  No complaints. Standing up to void via condom catheter. Wife at bedside.   Vitals:   05/18/21 2326 05/19/21 0409  BP: (!) 143/66 132/66  Pulse: 74 72  Resp: 18 20  Temp: 98.5 F (36.9 C) 98.5 F (36.9 C)  SpO2: 93% 96%    Physical Exam:  General appearance: Awake, alert in no apparent distress Cardiac: Heart rate and rhythm are regular Respirations: Nonlabored Incisions: Left neck incision well approximated without bleeding or hematoma Extremities: Moving all well. 5/5 grip strength.  Neuro: A and O times 4; tongue midline, face symmetric  CBC    Component Value Date/Time   WBC 15.2 (H) 05/19/2021 0513   RBC 4.04 (L) 05/19/2021 0513   HGB 14.2 05/19/2021 0513   HCT 40.0 05/19/2021 0513   PLT 178 05/19/2021 0513   MCV 99.0 05/19/2021 0513   MCH 35.1 (H) 05/19/2021 0513   MCHC 35.5 05/19/2021 0513   RDW 12.7 05/19/2021 0513   LYMPHSABS 1.3 04/07/2021 0524   MONOABS 0.9 04/07/2021 0524   EOSABS 0.2 04/07/2021 0524   BASOSABS 0.1 04/07/2021 0524    BMET    Component Value Date/Time   NA 135 05/19/2021 0513   K 4.4 05/19/2021 0513   CL 104 05/19/2021 0513   CO2 26 05/19/2021 0513   GLUCOSE 136 (H) 05/19/2021 0513   BUN 19 05/19/2021 0513   CREATININE 1.17 05/19/2021 0513   CALCIUM 8.4 (L) 05/19/2021 0513   GFRNONAA >60 05/19/2021 0513   GFRAA >60 08/27/2015 0339     Intake/Output Summary (Last 24 hours) at 05/19/2021 0729 Last data filed at 05/19/2021 0300 Gross per 24 hour  Intake 1550 ml  Output 1575 ml  Net -25 ml    HOSPITAL MEDICATIONS Scheduled Meds: . aspirin EC  81 mg Oral Q0600  . atorvastatin  40 mg Oral Daily  . carvedilol  3.125 mg Oral BID WC  . clopidogrel  75 mg Oral Daily  . docusate sodium  100 mg Oral Daily  . heparin  5,000 Units Subcutaneous Q8H  . losartan  50 mg Oral Daily  . pantoprazole  40 mg Oral Daily   Continuous Infusions: . sodium chloride    .  sodium chloride    . magnesium sulfate bolus IVPB     PRN Meds:.sodium chloride, acetaminophen **OR** acetaminophen, alum & mag hydroxide-simeth, bisacodyl, guaiFENesin-dextromethorphan, hydrALAZINE, HYDROmorphone (DILAUDID) injection, labetalol, magnesium sulfate bolus IVPB, metoprolol tartrate, ondansetron, oxyCODONE-acetaminophen, phenol, potassium chloride, senna-docusate  Assessment and Plan: POD 1 Neuro intact. Hemodynamically stable. Labs ok. DM-diet controlled. Had post-op glycemia. Follow-up with PCP. Discharge home. Follow-up with Dr Donnetta Hutching in [place]. Continue Plavix, aspirin and Plavix.   -DVT prophylaxis:  Heparin [place]   Risa Grill, PA-C [facility] [phone] 05/19/2021  7:29 AM    I have examined the patient, reviewed and agree with above.  Curt Jews, MD 05/19/2021 7:55 AM

## 2021-05-19 NOTE — Progress Notes (Signed)
Discharge instructions (including medications) discussed with and copy provided to patient/caregiver 

## 2021-05-19 NOTE — Progress Notes (Signed)
PHARMACIST LIPID MONITORING   [name] [name] is a 80 y.o. male admitted on 05/18/2021 for L CEA.  Pharmacy has been consulted to optimize lipid-lowering therapy with the indication of secondary prevention for clinical ASCVD.  Recent Labs:  Lipid Panel (last 6 months):   Lab Results  Component Value Date   CHOL 88 05/19/2021   TRIG 69 05/19/2021   HDL 28 (L) 05/19/2021   CHOLHDL 3.1 05/19/2021   VLDL 14 05/19/2021   LDLCALC 46 05/19/2021    Hepatic function panel (last 6 months):   Lab Results  Component Value Date   AST 30 05/16/2021   ALT 27 05/16/2021   ALKPHOS 79 05/16/2021   BILITOT 1.4 (H) 05/16/2021    SCr (since admission):   Serum creatinine: 1.17 mg/dL 05/19/21 0513 Estimated creatinine clearance: 66.8 mL/min  Current therapy and lipid therapy tolerance Current lipid-lowering therapy: Atorvastatin 40 mg daily Previous lipid-lowering therapies (if applicable): Atorvastatin 10 mg daily Documented or reported allergies or intolerances to lipid-lowering therapies (if applicable): Niacin > headaches  Assessment:  LDL 46.  Last LDL 86 on 04/03/21.  Atorvastatin dose was increased from 10 to 40 mg daily on 04/03/21, during admission for TIA.   LDL improved.  Plan:    No statin changes. The patient is already on a high intensity statin.   Arty Baumgartner, [place] 05/19/2021, 10:30 AM

## 2021-05-22 NOTE — Discharge Summary (Signed)
Discharge Summary     [name] [name] 03-24-1941 80 y.o. 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 male presented to the hospital with neurologic changes consisting of numbness in the lips on the left side of his mouth and also his left hand and left leg.  This initially resolved and he had recurrent symptoms of this while being seen in the hospital and had a near fall related to weakness in his left leg.  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  During his work-up he had a CT angiogram which showed 70 to 80% calcified left internal carotid artery stenosis.  He had no symptoms referable to this.  Hospital Course:  The patient was admitted to the hospital and taken to the operating room on 05/18/2021 and underwent left carotid endarterectomy.    Findings: Left internal carotid artery stenosis  The pt tolerated the procedure well and was transported to the PACU in excellent condition.   By POD 1, the pt neuro status intact and he was hemodynamically stable. His incision was well approximated without hematoma. He was tolerating his diet ane voiding spontaneously. His pain was controlled. Post-op labs were stable.  The remainder of the hospital course consisted of increasing mobilization and increasing intake of solids without difficulty.   No results for input(s): NA, K, CL, CO2, GLUCOSE, BUN, CALCIUM in the last 72 hours.  Invalid input(s): CRATININE No results for input(s): WBC, HGB, HCT, PLT in the last 72 hours. No results for input(s): INR in the last 72 hours.   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 Carotid stenosis, asymptomatic, left 05/18/2021  . 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 TIA (transient ischemic attack) 04/03/2021  . Lower extremity edema 08/13/2019  . Unsteady gait 06/09/2019  . Preventative health care 06/09/2019  . Hyperlipidemia 01/30/2016  . Carotid stenosis 08/26/2015  . 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 Carotid artery disease ([place]) 07/01/2012  . Special screening for malignant neoplasm of prostate 05/08/2011  . Vitamin D deficiency 11/20/2009  . Transient cerebral ischemia 06/30/2009  . CEREBROVASCULAR DISEASE 06/30/2009  . COLONIC POLYPS 01/10/2008  . Essential hypertension 01/10/2008  . Coronary atherosclerosis 01/10/2008  . 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 GYNECOMASTIA, UNILATERAL 01/10/2008  . 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 SHOULDER PAIN 01/10/2008  . Type 2 diabetes mellitus ([place]) 01/10/2008   Past Medical History:  Diagnosis Date  . Basal cell carcinoma 11/09/2019   nod & infil-behind right ear-cx3 &exc  . Basal cell carcinoma 03/21/2020   Residual BCC with peripheral margin involved - ST recommends MOHs  . Carotid artery occlusion   . Coronary artery disease    s/p CABG 2005; sees Dr Johnsie Cancel yearly  . Diabetes mellitus without complication ([place])   . Gynecomastia   . Hypercholesterolemia   . Hypertension   . Neurodermatitis   . Overweight(278.02)   . Personal history of colonic polyps 10/08/2006   tubular adenomas  . Renal insufficiency   .  Stroke [facility])    TIA  April 2022  . Transient ischemic attack 2010   "lasted ~ 5 seconds"  . Vitamin D deficiency     Allergies as of 05/19/2021      Reactions   Niacin Other (See Comments)   intol to NIACIN w/ headaches      Medication List    TAKE these medications   acetaminophen 325 MG  tablet Commonly known as: TYLENOL Take 2 tablets (650 mg total) by mouth every 4 (four) hours as needed for mild pain (or temp > 37.5 C (99.5 F)).   aspirin EC 81 MG tablet Take 1 tablet (81 mg total) by mouth daily.   atorvastatin 40 MG tablet Commonly known as: LIPITOR Take 1 tablet (40 mg total) by mouth daily. For cholesterol   carvedilol 3.125 MG tablet Commonly known as: COREG Take 1 tablet (3.125 mg total) by mouth 2 (two) times daily with a meal. For blood pressure   clopidogrel 75 MG tablet Commonly known as: PLAVIX Take 1 tablet (75 mg total) by mouth daily.   HYDROcodone-acetaminophen 5-325 MG tablet Commonly known as: NORCO/VICODIN Take 1 tablet by mouth every 4 (four) hours as needed for moderate pain.   losartan 25 MG tablet Commonly known as: COZAAR Take 2 tablets (50 mg total) by mouth daily.   nitroGLYCERIN 0.4 MG SL tablet Commonly known as: NITROSTAT Place 1 tablet (0.4 mg total) under the tongue every 5 (five) minutes as needed for chest pain.   vitamin B-12 1000 MCG tablet Commonly known as: CYANOCOBALAMIN Take 1,000 mcg by mouth daily.   cholecalciferol 25 MCG (1000 UNIT) tablet Commonly known as: VITAMIN D3 Take 1,000 Units by mouth daily.   Vitamin D 50 MCG (2000 UT) Caps Take 1 capsule (2,000 Units total) by mouth daily.        [facility] Discharge Instructions Carotid Endarterectomy (CEA)  Please refer to the following instructions for your post-procedure care. Your surgeon or physician assistant will discuss any changes with you.  Activity  You are encouraged to walk as much as you can. You can slowly return to normal activities but must avoid strenuous activity and heavy lifting until your doctor tell you it's OK. Avoid activities such as vacuuming or swinging a golf club. You can drive after one week if you are comfortable and you are no longer taking prescription pain medications. It is normal to feel  tired for serval weeks after your surgery. It is also normal to have difficulty with sleep habits, eating, and bowel movements after surgery. These will go away with time.  Bathing/Showering  You may shower after you come home. Do not soak in a bathtub, hot tub, or swim until the incision heals completely.  Incision Care  Shower every day. Clean your incision with mild soap and water. Pat the area dry with a clean towel. You do not need a bandage unless otherwise instructed. Do not apply any ointments or creams to your incision. You may have skin glue on your incision. Do not peel it off. It will come off on its own in about one week. Your incision may feel thickened and raised for several weeks after your surgery. This is normal and the skin will soften over time. For Men Only: It's OK to shave around the incision but do not shave the incision itself for 2 weeks. It is common to have numbness under your chin that could last for several months.  Diet  Resume your normal diet. There are no special food restrictions following this procedure. A low fat/low cholesterol diet is recommended for all patients with vascular disease. In order to heal from your surgery, it is CRITICAL to get adequate nutrition. Your body requires vitamins, minerals, and protein. Vegetables are the best source of vitamins and minerals. Vegetables also provide the perfect balance of protein. Processed food has little nutritional value, so try to avoid this.  Medications  Resume taking all of your medications unless your doctor or physician assistant tells you not to.  If your incision is causing pain, you may take over-the- counter pain relievers such as acetaminophen (Tylenol). If you were prescribed a stronger pain medication, please be aware these medications can cause nausea and constipation.  Prevent nausea by taking the medication with a snack or meal. Avoid constipation by drinking plenty of fluids and eating foods with a  high amount of fiber, such as fruits, vegetables, and grains.  Do not take Tylenol if you are taking prescription pain medications.  Follow Up  Our office will schedule a follow up appointment 2-3 weeks following discharge.  Please call us immediately for any of the following conditions  . Increased pain, redness, drainage (pus) from your incision site. . Fever of 101 degrees or higher. . If you should develop stroke (slurred speech, difficulty swallowing, weakness on one side of your body, loss of vision) you should call 911 and go to the nearest emergency room. .  Reduce your risk of vascular disease:  . Stop smoking. If you would like help call [facility] at 1-800-QUIT-NOW 508 239 7022) or [place] at [phone]. . Manage your cholesterol . Maintain a desired weight . Control your diabetes . Keep your blood pressure down .  If you have any questions, please call the office at [phone].  Prescriptions given: 1.   Roxicet #15 No Refill   Disposition: home  Patient's condition: is Excellent  Follow up: 1. Dr. Donnetta Hutching in 2 weeks.   Risa Grill, PA-C [facility] [phone]   --- For [facility] use ---   Modified Rankin score at D/C (0-6): 0  IV medication needed for:  1. Hypertension: No 2. Hypotension: No  Post-op Complications: No  1. Post-op CVA or TIA: No  If yes: Event classification (right eye, left eye, right cortical, left cortical, verterobasilar, other):   If yes: Timing of event (intra-op, <6 hrs post-op, >=6 hrs post-op, unknown):   2. CN injury: No  If yes: CN n/a injuried   3. Myocardial infarction: No  If yes: Dx by (EKG or clinical, Troponin):   4.  CHF: No  5.  Dysrhythmia (new): No  6. Wound infection: No  7. Reperfusion symptoms: No  8. Return to OR: No  If yes: return to OR for (bleeding, neurologic, other CEA incision, other):   Discharge medications: Statin use:  Yes ASA use:  Yes    Beta blocker use:  Yes ACE-Inhibitor use:  No  ARB use:  Yes CCB use: No P2Y12 Antagonist use: Yes, [ x] Plavix, [ ]  Plasugrel, [ ]  Ticlopinine, [ ]  Ticagrelor, [ ]  Other, [ ]  No for medical reason, [ ]  Non-compliant, [ ]  Not-indicated Anti-coagulant use:  No, [ ]  Warfarin, [ ]  Rivaroxaban, [ ]  Dabigatran,

## 2021-05-23 ENCOUNTER — Encounter: Payer: Self-pay | Admitting: *Deleted

## 2021-05-24 ENCOUNTER — Other Ambulatory Visit: Payer: Self-pay

## 2021-05-24 ENCOUNTER — Ambulatory Visit: Payer: Medicare PPO

## 2021-05-24 DIAGNOSIS — R2689 Other abnormalities of gait and mobility: Secondary | ICD-10-CM | POA: Diagnosis not present

## 2021-05-24 DIAGNOSIS — I6381 Other cerebral infarction due to occlusion or stenosis of small artery: Secondary | ICD-10-CM

## 2021-05-24 DIAGNOSIS — M6281 Muscle weakness (generalized): Secondary | ICD-10-CM | POA: Diagnosis not present

## 2021-05-24 DIAGNOSIS — I639 Cerebral infarction, unspecified: Secondary | ICD-10-CM

## 2021-05-24 NOTE — Therapy (Addendum)
[facility] [address] [place], [place], [address] Phone: [phone]   Fax:  [phone]  Physical Therapy Treatment  Patient Details  Name: [name] [name] MRN: [id number] Date of Birth: [birth year] Referring Provider (PT): Dr. Alysia Penna   Encounter Date: 05/24/2021   PT End of Session - 05/24/21 0946    Visit Number 6    Number of Visits 12    Date for PT Re-Evaluation 07/05/21    Authorization Type [employer]; Recert 09/15/37 to 04/05/64    Authorization Time Period Plan for 4 visits and then assess for discharge    PT Start Time 0935    PT Stop Time 1015    PT Time Calculation (min) 40 min    Equipment Utilized During Treatment Gait belt    Activity Tolerance Patient tolerated treatment well    Behavior During Therapy WFL for tasks assessed/performed           Past Medical History:  Diagnosis Date  . Basal cell carcinoma 11/09/2019   nod & infil-behind right ear-cx3 &exc  . Basal cell carcinoma 03/21/2020   Residual BCC with peripheral margin involved - ST recommends MOHs  . Carotid artery occlusion   . Coronary artery disease    s/p CABG 2005; sees Dr Johnsie Cancel yearly  . Diabetes mellitus without complication ([place])   . Gynecomastia   . Hypercholesterolemia   . Hypertension   . Neurodermatitis   . Overweight(278.02)   . Personal history of colonic polyps 10/08/2006   tubular adenomas  . Renal insufficiency   . Stroke [facility])    TIA  April 2022  . Transient ischemic attack 2010   "lasted ~ 5 seconds"  . Vitamin D deficiency     Past Surgical History:  Procedure Laterality Date  . CARDIAC CATHETERIZATION  02/2004   "tried to stent; couldn't"  . CAROTID ENDARTERECTOMY Right 08/26/2015  . COLONOSCOPY    . CORONARY ANGIOPLASTY    . CORONARY ARTERY BYPASS GRAFT  Feb. 2005   4 vessel  . ENDARTERECTOMY Right 08/26/2015   Procedure: Right Carotid ENDARTERECTOMY with Patch Angioplasty ;  Surgeon: Rosetta Posner, MD;  Location: [facility];  Service: Vascular;  Laterality: Right;  . ENDARTERECTOMY Left 05/18/2021   Procedure: LEFT CAROTID ARTERY ENDARTERECTOMY  with patch angioplasty;  Surgeon: Rosetta Posner, MD;  Location: [facility];  Service: Vascular;  Laterality: Left;  . EYE SURGERY     bilateral cataract  . 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 Dessie Coma  . KELOID EXCISION    . PILONIDAL CYST EXCISION  1989    There were no vitals filed for this visit.   Subjective Assessment - 05/24/21 0947    Subjective Pt reports he had carotid artery surgery on R last Thursday. He is brusied from surgery. He feels tired from surgery which surgeon said is normal for few days. They had different set of instructions/precautions as he was told to not drive for 1 week, not to perform any strenosus activity after surgery, no heavy lifting (for unknown amount of time)    Patient is accompained by: Family member   wife   Limitations Walking    Patient Stated Goals improve balane           BP: 153/75 measusred 5 min after walking  Today's treatment: Gait training: 840' without AD and CGA, cues to improve heel to toe walking Figure 8 walking: around 4 cones placed 4  feet apart: 10 laps continuous Standing box taps: 17.5 inches tall: 15x R and L without HHA Supine piriformis stretch: 3 x 30" R and L added to HEP   BP 127/76 post session                       PT Short Term Goals - 05/17/21 0949      PT SHORT TERM GOAL #1   Title Pt will be able to walk 20 min for at least 5 days a week to improve walking endurance.    Baseline No walking    Time 4    Period Weeks    Status Revised    Target Date 06/14/21             PT Long Term Goals - 05/17/21 0955      PT LONG TERM GOAL #1   Title Pt will demo 5x sit to stand in 16 sec or less to improve functional strength    Baseline 21 sec (04/19/21); 10 sec (05/17/21)    Time 6    Period Weeks    Status Achieved      PT  LONG TERM GOAL #2   Title Pt will be I and compliant with walking program to independently manage symptoms at home.    Baseline Walking progrm issued on 04/19/21; pt is compliant with 5 days a week and walking 16 min/day this week (05/17/21)    Time 6    Period Weeks    Status On-going      PT LONG TERM GOAL #3   Title Pt will demo 85 points on LE CVA on FOTO to improve overall function    Baseline 74 (4/20/2)    Time 7    Period Weeks    Status On-going    Target Date 07/05/21      PT LONG TERM GOAL #4   Title Pt will demo 0.49m/s walking speed to improve fall risk and community ambulation    Baseline 0.49m/s (04/19/21) no AD; 0.74 m/s (05/17/21)    Time 7    Period Weeks    Status On-going    Target Date 07/05/21                 Plan - 05/24/21 0955    Clinical Impression Statement Today's session was focused on light gait and standing balance activities due to recent cartoid artery sugery.    Personal Factors and Comorbidities Age;Time since onset of injury/illness/exacerbation;Past/Current Experience    Examination-Activity Limitations Lift;Stairs;Squat    Examination-Participation Restrictions Church;Community Activity;Driving;Shop;Yard Work;Cleaning    Stability/Clinical Decision Making Stable/Uncomplicated    Rehab Potential Good    PT Frequency 1x / week    PT Duration Other (comment)   7 weeks   PT Treatment/Interventions ADLs/Self Care Home Management;Gait training;Stair training;Functional mobility training;Therapeutic activities;Therapeutic exercise;Balance training;Neuromuscular re-education;Patient/family education;Orthotic Fit/Training;Manual techniques;Passive range of motion;Energy conservation;Joint Manipulations    PT Next Visit Plan Add more sessions.    PT Home Exercise Plan Access Code [email or web address]    Consulted and Agree with Plan of Care Patient;Family member/caregiver           Patient will benefit from skilled therapeutic intervention in order to  improve the following deficits and impairments:  Abnormal gait,Decreased activity tolerance,Decreased balance,Decreased endurance,Decreased mobility,Difficulty walking,Decreased strength,Postural dysfunction  Visit Diagnosis: Right thalamic infarction [facility])  Muscle weakness (generalized)  Other abnormalities of gait and mobility     Problem List Patient Active Problem List  Diagnosis Date Noted  . Carotid stenosis, asymptomatic, left 05/18/2021  . 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 TIA (transient ischemic attack) 04/03/2021  . Lower extremity edema 08/13/2019  . Unsteady gait 06/09/2019  . Preventative health care 06/09/2019  . Hyperlipidemia 01/30/2016  . Carotid stenosis 08/26/2015  . 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 Carotid artery disease ([place]) 07/01/2012  . Special screening for malignant neoplasm of prostate 05/08/2011  . Vitamin D deficiency 11/20/2009  . Transient cerebral ischemia 06/30/2009  . CEREBROVASCULAR DISEASE 06/30/2009  . COLONIC POLYPS 01/10/2008  . Essential hypertension 01/10/2008  . Coronary atherosclerosis 01/10/2008  . 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 GYNECOMASTIA, UNILATERAL 01/10/2008  . 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 SHOULDER PAIN 01/10/2008  . Type 2 diabetes mellitus ([place]) 01/10/2008    [name], PT 05/24/2021, 10:16 AM  [facility] [address] [place], [place], [address] Phone: [phone]   Fax:  [phone]  Name: [name] [name] MRN: [id number] Date of Birth: [birth year]

## 2021-05-26 ENCOUNTER — Telehealth: Payer: Self-pay | Admitting: Primary Care

## 2021-05-26 NOTE — Telephone Encounter (Signed)
Please kindly notify patient that I would like to get his labs during the day of his visit.  He's had a lot of labs already so I'd like to go over those and then decide from there what to add. We can draw him during our visit.

## 2021-05-26 NOTE — Telephone Encounter (Signed)
Pt called in wanted to know about getting his lb work done in , and they always PSA, CRP A1C, B-12, vit-D labs and they are going to [facility] for the lab work..  And on 4/3 he had a mini stroke and he is going to PT, and a week ago on 5/19 he had surgery on the cardoit artery

## 2021-05-26 NOTE — Telephone Encounter (Signed)
Patient called and verified that he will go over his labs from 05/19/2021 on his next appointment and will discuss other labs to have drawn. Patient stated his understanding and appreciation.

## 2021-05-30 ENCOUNTER — Other Ambulatory Visit: Payer: Self-pay

## 2021-05-30 ENCOUNTER — Encounter: Payer: Medicare PPO | Attending: Registered Nurse | Admitting: Physical Medicine & Rehabilitation

## 2021-05-30 ENCOUNTER — Encounter: Payer: Self-pay | Admitting: Physical Medicine & Rehabilitation

## 2021-05-30 VITALS — BP 161/90 | HR 69 | Temp 98.6°F | Ht 73.0 in | Wt 225.8 lb

## 2021-05-30 DIAGNOSIS — R269 Unspecified abnormalities of gait and mobility: Secondary | ICD-10-CM | POA: Diagnosis not present

## 2021-05-30 DIAGNOSIS — I69398 Other sequelae of cerebral infarction: Secondary | ICD-10-CM | POA: Insufficient documentation

## 2021-05-30 NOTE — Progress Notes (Signed)
Subjective:    Patient ID: [name] [name], male    DOB: May 23, 1941, 80 y.o.   MRN: [id number] 80 y.o. right-handed male with history of TIA, hypertension, hyperlipidemia, CAD with CABG 2005 maintained on aspirin, right CEA.  Patient lives with spouse independent prior to admission using assistive device.  1 level home 3 steps to entry.  Presented 04/03/2021 with acute onset of left-sided weakness.  Cranial CT scan negative for acute changes.  CT angiogram head and neck showed no emergent large vessel occlusion.  Approximately 50% stenosis of the left cavernous internal carotid artery and approximately 70 to 80% of the proximal ICA just distal to the carotid bifurcation.  MRI of the brain showed small acute right thalamic infarction.  Echocardiogram with ejection fraction of 60 to 65% no wall motion abnormalities.  Admission chemistries unremarkable except glucose 190 creatinine 1.25 alcohol negative.  Maintained on aspirin plus Plavix for CVA prophylaxis x3 weeks then Plavix alone  Admit date: 04/06/2021 Discharge date: 04/12/2021  HPI  Stroke rehab  followup Some tingling  Left  index and middle finger intermittently throughout the day,  Amb with cane outside the home Does not use in home   Back to driving  Pain Inventory Average Pain 0 Pain Right Now 0 My pain is no pain  LOCATION OF PAIN  No pain  BOWEL Number of stools per week: 7 Oral laxative use No  Type of laxative n/a Enema or suppository use No  History of colostomy No  Incontinent No   BLADDER Normal In and out cath, frequency n/a Able to self cath Yes  Bladder incontinence No  Frequent urination No  Leakage with coughing No  Difficulty starting stream No  Incomplete bladder emptying No    Mobility use a cane how many minutes can you walk? 18  ability to climb steps?  yes do you drive?  yes  Function retired  Neuro/Psych No problems in this area  Prior Studies n/a  Physicians involved in your  care n/a   Family History  Problem Relation Age of Onset  . Heart disease Mother        Before age 74  . Diabetes Mother   . Kidney disease Mother   . Heart attack Mother 74  . Lung cancer Father 35  . Diabetes Brother   . Heart disease Brother   . Heart disease Brother   . Arthritis Brother   . Diabetes Sister   . Fibromyalgia Sister   . Lung cancer Paternal Uncle        questionable as to if it was lung ca  . Healthy Daughter   . Colon cancer Neg Hx   . Stroke Neg Hx    Social History   Socioeconomic History  . Marital status: Married    Spouse name: [name]  . Number of children: 1  . Years of education: Not on file  . Highest education level: Some college, no degree  Occupational History  . Occupation: retired  Tobacco Use  . Smoking status: Never Smoker  . Smokeless tobacco: Current User    Types: Snuff  . Tobacco comment: uses 2 dip/week  Vaping Use  . Vaping Use: Never used  Substance and Sexual Activity  . Alcohol use: No    Alcohol/week: 0.0 standard drinks  . Drug use: No  . Sexual activity: Yes  Other Topics Concern  . Not on file  Social History Narrative   Retired.   Once worked for the  [employer].   Married.   Enjoys reading, spending time with family.    Left handed   Drinks caffeine   One story home   Social Determinants of Health   Financial Resource Strain: Low Risk   . Difficulty of Paying Living Expenses: Not hard at all  Food Insecurity: No Food Insecurity  . Worried About Charity fundraiser in the Last Year: Never true  . Ran Out of Food in the Last Year: Never true  Transportation Needs: No Transportation Needs  . Lack of Transportation (Medical): No  . Lack of Transportation (Non-Medical): No  Physical Activity: Insufficiently Active  . Days of Exercise per Week: 1 day  . Minutes of Exercise per Session: 20 min  Stress: No Stress Concern Present  . Feeling of Stress : Not at all  Social Connections: Moderately Integrated  .  Frequency of Communication with Friends and Family: More than three times a week  . Frequency of Social Gatherings with Friends and Family: More than three times a week  . Attends Religious Services: More than 4 times per year  . Active Member of Clubs or Organizations: No  . Attends Archivist Meetings: Never  . Marital Status: Married   Past Surgical History:  Procedure Laterality Date  . CARDIAC CATHETERIZATION  02/2004   "tried to stent; couldn't"  . CAROTID ENDARTERECTOMY Right 08/26/2015  . COLONOSCOPY    . CORONARY ANGIOPLASTY    . CORONARY ARTERY BYPASS GRAFT  Feb. 2005   4 vessel  . ENDARTERECTOMY Right 08/26/2015   Procedure: Right Carotid ENDARTERECTOMY with Patch Angioplasty ;  Surgeon: Rosetta Posner, MD;  Location: [facility];  Service: Vascular;  Laterality: Right;  . ENDARTERECTOMY Left 05/18/2021   Procedure: LEFT CAROTID ARTERY ENDARTERECTOMY  with patch angioplasty;  Surgeon: Rosetta Posner, MD;  Location: [facility];  Service: Vascular;  Laterality: Left;  . EYE SURGERY     bilateral cataract  . 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 Dessie Coma  . KELOID EXCISION    . PILONIDAL CYST EXCISION  1989   Past Medical History:  Diagnosis Date  . Basal cell carcinoma 11/09/2019   nod & infil-behind right ear-cx3 &exc  . Basal cell carcinoma 03/21/2020   Residual BCC with peripheral margin involved - ST recommends MOHs  . Carotid artery occlusion   . Coronary artery disease    s/p CABG 2005; sees Dr Johnsie Cancel yearly  . Diabetes mellitus without complication ([place])   . Gynecomastia   . Hypercholesterolemia   . Hypertension   . Neurodermatitis   . Overweight(278.02)   . Personal history of colonic polyps 10/08/2006   tubular adenomas  . Renal insufficiency   . Stroke [facility])    TIA  April 2022  . Transient ischemic attack 2010   "lasted ~ 5 seconds"  . Vitamin D deficiency    Temp 98.6 F (37 C)   Ht 6\' 1"  (1.854 m)   Wt 225 lb 12.8 oz (102.4 kg)   BMI 29.79  kg/m   Opioid Risk Score:   Fall Risk Score:  `1  Depression screen PHQ 2/9  Depression screen [facility] 2/9 04/24/2021 03/10/2021 08/01/2016 07/28/2014 07/02/2013  Decreased Interest 0 0 0 0 0  Down, Depressed, Hopeless 0 0 0 0 0  PHQ - 2 Score 0 0 0 0 0  Altered sleeping - 1 - - -  Tired, decreased energy - 1 - - -  Change in appetite -  0 - - -  Feeling bad or failure about yourself  - 0 - - -  Trouble concentrating - 0 - - -  Moving slowly or fidgety/restless - 0 - - -  Suicidal thoughts - 0 - - -  PHQ-9 Score - 2 - - -  Difficult doing work/chores - Not difficult at all - - -  Some recent data might be hidden       Review of Systems  Constitutional: Negative.   HENT: Negative.   Eyes: Negative.   Respiratory: Negative.   Cardiovascular: Negative.   Gastrointestinal: Negative.   Endocrine: Negative.   Genitourinary: Negative.   Musculoskeletal: Negative.   Skin: Negative.   Allergic/Immunologic: Negative.   Neurological: Negative.   Hematological: Negative.   Psychiatric/Behavioral: Negative.        Objective:   Physical Exam Vitals and nursing note reviewed.  Constitutional:      Appearance: He is obese.  HENT:     Head: Normocephalic and atraumatic.  Eyes:     Extraocular Movements: Extraocular movements intact.     Conjunctiva/sclera: Conjunctivae normal.     Pupils: Pupils are equal, round, and reactive to light.  Musculoskeletal:     Right lower leg: Edema present.     Left lower leg: Edema present.  Skin:    General: Skin is warm and dry.  Neurological:     Mental Status: He is alert and oriented to person, place, and time.  Psychiatric:        Mood and Affect: Mood normal.        Behavior: Behavior normal.    Motor strength is 5/5 bilateral deltoid bicep tricep grip hip flexor knee extensor ankle dorsiflexor plantar flexor Gait without evidence of toe drag or knee instability slightly wide-based support  Reduced light touch and pinprick digits 2 and  3 left hand Negative Tinel's Negative reverse Phalen's     Assessment & Plan:  1.  History of small right thalamic infarct residual deficits mainly with balance and minimal loss of sensation at fingertips of digits 2 and 3 of the left hand  Overall back to modified independent level. Physical medicine rehab follow-up on as-needed basis he will finish out his outpatient PT over the next several weeks.

## 2021-05-30 NOTE — Patient Instructions (Signed)
Please call if you'd like me to re evaluate your progress

## 2021-06-02 ENCOUNTER — Other Ambulatory Visit: Payer: Self-pay | Admitting: Primary Care

## 2021-06-02 DIAGNOSIS — I10 Essential (primary) hypertension: Secondary | ICD-10-CM

## 2021-06-06 ENCOUNTER — Ambulatory Visit: Payer: Medicare PPO | Attending: Physical Medicine & Rehabilitation

## 2021-06-06 ENCOUNTER — Other Ambulatory Visit: Payer: Self-pay

## 2021-06-06 DIAGNOSIS — M6281 Muscle weakness (generalized): Secondary | ICD-10-CM | POA: Diagnosis not present

## 2021-06-06 DIAGNOSIS — I639 Cerebral infarction, unspecified: Secondary | ICD-10-CM | POA: Insufficient documentation

## 2021-06-06 DIAGNOSIS — R2689 Other abnormalities of gait and mobility: Secondary | ICD-10-CM | POA: Insufficient documentation

## 2021-06-06 DIAGNOSIS — I6381 Other cerebral infarction due to occlusion or stenosis of small artery: Secondary | ICD-10-CM

## 2021-06-06 NOTE — Therapy (Signed)
[facility] [address] [place], [place], [address] Phone: [phone]   Fax:  [phone]  Physical Therapy Treatment  Patient Details  Name: [name] [name] MRN: [id number] Date of Birth: [birth year] Referring Provider (PT): Dr. Alysia Penna   Encounter Date: 06/06/2021   PT End of Session - 06/06/21 0933    Visit Number 7    Number of Visits 12    Date for PT Re-Evaluation 07/05/21    Authorization Type [employer]; Recert 7/67/34 to 01/09/36    Authorization Time Period Plan for 4 visits and then assess for discharge    PT Start Time 0930    PT Stop Time 1015    PT Time Calculation (min) 45 min    Equipment Utilized During Treatment Gait belt    Activity Tolerance Patient tolerated treatment well    Behavior During Therapy WFL for tasks assessed/performed           Past Medical History:  Diagnosis Date  . Basal cell carcinoma 11/09/2019   nod & infil-behind right ear-cx3 &exc  . Basal cell carcinoma 03/21/2020   Residual BCC with peripheral margin involved - ST recommends MOHs  . Carotid artery occlusion   . Coronary artery disease    s/p CABG 2005; sees Dr Johnsie Cancel yearly  . Diabetes mellitus without complication ([place])   . Gynecomastia   . Hypercholesterolemia   . Hypertension   . Neurodermatitis   . Overweight(278.02)   . Personal history of colonic polyps 10/08/2006   tubular adenomas  . Renal insufficiency   . Stroke [facility])    TIA  April 2022  . Transient ischemic attack 2010   "lasted ~ 5 seconds"  . Vitamin D deficiency     Past Surgical History:  Procedure Laterality Date  . CARDIAC CATHETERIZATION  02/2004   "tried to stent; couldn't"  . CAROTID ENDARTERECTOMY Right 08/26/2015  . COLONOSCOPY    . CORONARY ANGIOPLASTY    . CORONARY ARTERY BYPASS GRAFT  Feb. 2005   4 vessel  . ENDARTERECTOMY Right 08/26/2015   Procedure: Right Carotid ENDARTERECTOMY with Patch Angioplasty ;  Surgeon: Rosetta Posner, MD;  Location: [facility];  Service: Vascular;  Laterality: Right;  . ENDARTERECTOMY Left 05/18/2021   Procedure: LEFT CAROTID ARTERY ENDARTERECTOMY  with patch angioplasty;  Surgeon: Rosetta Posner, MD;  Location: [facility];  Service: Vascular;  Laterality: Left;  . EYE SURGERY     bilateral cataract  . 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 Dessie Coma  . KELOID EXCISION    . PILONIDAL CYST EXCISION  1989    There were no vitals filed for this visit.   Subjective Assessment - 06/06/21 0934    Subjective Pt reports with intermittent stop/go walking he has noticed he feels left leg gets weaker and feels harder to walk. Pt reports these symptoms are new and started last week.                 Lower trunk rotations: 10 x 10" holds  Supine piriformis stretch: 3 x 30" R and L Sit to stand: 2 x 10, 12 lbs Gait speed: trial 1: 11.64 s, Trial 2: 12.31 s = 0.56m/s Leg press: 90lbs bil 10x; 60lbs uni 10x R and L Sci FIT level 8 for 5'                     PT Short Term Goals - 05/17/21 9024  PT SHORT TERM GOAL #1   Title Pt will be able to walk 20 min for at least 5 days a week to improve walking endurance.    Baseline No walking    Time 4    Period Weeks    Status Revised    Target Date 06/14/21             PT Long Term Goals - 06/06/21 1002      PT LONG TERM GOAL #1   Title Pt will demo 5x sit to stand in 16 sec or less to improve functional strength    Baseline 21 sec (04/19/21); 10 sec (05/17/21)    Time 6    Period Weeks    Status Achieved      PT LONG TERM GOAL #2   Title Pt will be I and compliant with walking program to independently manage symptoms at home.    Baseline Walking progrm issued on 04/19/21; pt is compliant with 5 days a week and walking 16 min/day this week (05/17/21)    Time 6    Period Weeks    Status On-going      PT LONG TERM GOAL #3   Title Pt will demo 85 points on LE CVA on FOTO to improve overall function     Baseline 74 (4/20/2)    Time 7    Period Weeks    Status On-going      PT LONG TERM GOAL #4   Title Pt will demo 0.33m/s walking speed to improve fall risk and community ambulation    Baseline 0.105m/s (04/19/21) no AD; 0.74 m/s (05/17/21); 0.83 m/s (06/06/21)    Time 7    Period Weeks    Status On-going                 Plan - 06/06/21 1015    Clinical Impression Statement Improved gait speed compared to last reasssessment. Patient was more fatigued and more difificulty with sit to stand transfers today. His insidious L LE weakness may be due to lumbar radiculopathy.    Personal Factors and Comorbidities Age;Time since onset of injury/illness/exacerbation;Past/Current Experience    Examination-Activity Limitations Lift;Stairs;Squat    Examination-Participation Restrictions Church;Community Activity;Driving;Shop;Yard Work;Cleaning    Stability/Clinical Decision Making Stable/Uncomplicated    Rehab Potential Good    PT Frequency 1x / week    PT Duration Other (comment)   7 weeks   PT Treatment/Interventions ADLs/Self Care Home Management;Gait training;Stair training;Functional mobility training;Therapeutic activities;Therapeutic exercise;Balance training;Neuromuscular re-education;Patient/family education;Orthotic Fit/Training;Manual techniques;Passive range of motion;Energy conservation;Joint Manipulations    PT Next Visit Plan Add more sessions.    PT Home Exercise Plan Access Code [email or web address]    Consulted and Agree with Plan of Care Patient;Family member/caregiver           Patient will benefit from skilled therapeutic intervention in order to improve the following deficits and impairments:  Abnormal gait,Decreased activity tolerance,Decreased balance,Decreased endurance,Decreased mobility,Difficulty walking,Decreased strength,Postural dysfunction  Visit Diagnosis: Right thalamic infarction [facility])  Muscle weakness (generalized)  Other abnormalities of gait and  mobility     Problem List Patient Active Problem List   Diagnosis Date Noted  . Carotid stenosis, asymptomatic, left 05/18/2021  . 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[place]) 04/06/2021  . TIA (transient ischemic attack) 04/03/2021  . Lower extremity edema 08/13/2019  . Unsteady gait 06/09/2019  . Preventative health care 06/09/2019  . Hyperlipidemia 01/30/2016  . Carotid stenosis 08/26/2015  . 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 Carotid artery disease ([place]) 07/01/2012  .  Special screening for malignant neoplasm of prostate 05/08/2011  . Vitamin D deficiency 11/20/2009  . Transient cerebral ischemia 06/30/2009  . CEREBROVASCULAR DISEASE 06/30/2009  . COLONIC POLYPS 01/10/2008  . Essential hypertension 01/10/2008  . Coronary atherosclerosis 01/10/2008  . 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 GYNECOMASTIA, UNILATERAL 01/10/2008  . 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 SHOULDER PAIN 01/10/2008  . Type 2 diabetes mellitus ([place]) 01/10/2008    [name], PT 06/06/2021, 10:16 AM  [facility] [address] [place], [place], [address] Phone: [phone]   Fax:  [phone]  Name: [name] [name] MRN: [id number] Date of Birth: 07/09/1941

## 2021-06-08 ENCOUNTER — Ambulatory Visit: Payer: Medicare PPO

## 2021-06-08 ENCOUNTER — Other Ambulatory Visit: Payer: Self-pay

## 2021-06-08 NOTE — Therapy (Signed)
[facility] [address] [place], [place], [address] Phone: [phone]   Fax:  [phone]  Patient Details  Name: [name] [name] MRN: [id number] Date of Birth: 1941-04-18 Referring Provider:  Charlett Blake, MD  Encounter Date: 06/08/2021   Pt present with wife. Wife reports he only ate half a banana in the morning had a coffee. Patient looked fatigued when he came in was walking slower than normal. BP was 145/73. Pt was asked if he is okay to walk. Pt agreed. Pt walked 200 feet in grass and 500 feet on level ground but was getting slower as he was fatiguing quicker. Pt and wife educated that it is best to avoid therapy today as he is already fatigued and possibly dehydrated. Pt was given 2 cups of water pre and post session to drink. Pt and wife educated on drinking enough water and improtance of eating adequate meals for energy. No charge for today's session.  [name], PT 06/08/2021, 3:05 PM  [facility] [address] [place], [place], [address] Phone: [phone]   Fax:  [phone]

## 2021-06-13 ENCOUNTER — Ambulatory Visit (INDEPENDENT_AMBULATORY_CARE_PROVIDER_SITE_OTHER): Payer: Medicare PPO

## 2021-06-13 ENCOUNTER — Other Ambulatory Visit: Payer: Self-pay

## 2021-06-13 DIAGNOSIS — Z0000 Encounter for general adult medical examination without abnormal findings: Secondary | ICD-10-CM | POA: Diagnosis not present

## 2021-06-13 NOTE — Progress Notes (Signed)
Subjective:   [name] [name] is a 80 y.o. male who presents for Medicare Annual/Subsequent preventive examination.  Review of Systems: N/A     I connected with the patient today by telephone and verified that I am speaking with the correct person using two identifiers. Location patient: home Location nurse: work Persons participating in the telephone visit: patient, nurse.   I discussed the limitations, risks, security and privacy concerns of performing an evaluation and management service by telephone and the availability of in person appointments. I also discussed with the patient that there may be a patient responsible charge related to this service. The patient expressed understanding and verbally consented to this telephonic visit.        Cardiac Risk Factors include: advanced age (>2men, >56 women);diabetes mellitus;hypertension;male gender     Objective:    Today's Vitals   There is no height or weight on file to calculate BMI.  Advanced Directives 06/13/2021 05/16/2021 04/19/2021 04/18/2021 04/06/2021 10/20/2019 10/31/2018  Does Patient Have a Medical Advance Directive? No No No No No No No  Would patient like information on creating a medical advance directive? No - Patient declined - - - No - Patient declined No - Patient declined No - Patient declined    Current Medications (verified) Outpatient Encounter Medications as of 06/13/2021  Medication Sig   acetaminophen (TYLENOL) 325 MG tablet Take 2 tablets (650 mg total) by mouth every 4 (four) hours as needed for mild pain (or temp > 37.5 C (99.5 F)).   atorvastatin (LIPITOR) 40 MG tablet Take 1 tablet (40 mg total) by mouth daily. For cholesterol   carvedilol (COREG) 3.125 MG tablet TAKE 1 TABLET (3.125 MG TOTAL) BY MOUTH 2 (TWO) TIMES DAILY WITH A MEAL. FOR BLOOD PRESSURE   Cholecalciferol (VITAMIN D) 50 MCG (2000 UT) CAPS Take 1 capsule (2,000 Units total) by mouth daily.   cholecalciferol (VITAMIN D3) 25 MCG (1000  UNIT) tablet Take 1,000 Units by mouth daily.   clopidogrel (PLAVIX) 75 MG tablet Take 1 tablet (75 mg total) by mouth daily.   losartan (COZAAR) 25 MG tablet Take 2 tablets (50 mg total) by mouth daily.   nitroGLYCERIN (NITROSTAT) 0.4 MG SL tablet Place 1 tablet (0.4 mg total) under the tongue every 5 (five) minutes as needed for chest pain.   vitamin B-12 (CYANOCOBALAMIN) 1000 MCG tablet Take 1,000 mcg by mouth daily.   aspirin EC 81 MG tablet Take 1 tablet (81 mg total) by mouth daily. (Patient not taking: Reported on 06/13/2021)   HYDROcodone-acetaminophen (NORCO/VICODIN) 5-325 MG tablet Take 1 tablet by mouth every 4 (four) hours as needed for moderate pain. (Patient not taking: Reported on 06/13/2021)   No facility-administered encounter medications on file as of 06/13/2021.    Allergies (verified) Niacin   History: Past Medical History:  Diagnosis Date   Basal cell carcinoma 11/09/2019   nod & infil-behind right ear-cx3 &exc   Basal cell carcinoma 03/21/2020   Residual BCC with peripheral margin involved - ST recommends [facility]   Carotid artery occlusion    Coronary artery disease    s/p CABG 2005; sees Dr Johnsie Cancel yearly   Diabetes mellitus without complication ([place])    Gynecomastia    Hypercholesterolemia    Hypertension    Neurodermatitis    Overweight(278.02)    Personal history of colonic polyps 10/08/2006   tubular adenomas   Renal insufficiency    Stroke [facility])    TIA  April 2022   Transient ischemic  attack 2010   "lasted ~ 5 seconds"   Vitamin D deficiency    Past Surgical History:  Procedure Laterality Date   CARDIAC CATHETERIZATION  02/2004   "tried to stent; couldn't"   CAROTID ENDARTERECTOMY Right 08/26/2015   COLONOSCOPY     CORONARY ANGIOPLASTY     CORONARY ARTERY BYPASS GRAFT  Feb. 2005   4 vessel   ENDARTERECTOMY Right 08/26/2015   Procedure: Right Carotid ENDARTERECTOMY with Patch Angioplasty ;  Surgeon: Rosetta Posner, MD;  Location: [facility] OR;  Service:  Vascular;  Laterality: Right;   ENDARTERECTOMY Left 05/18/2021   Procedure: LEFT CAROTID ARTERY ENDARTERECTOMY  with patch angioplasty;  Surgeon: Rosetta Posner, MD;  Location: [facility] OR;  Service: Vascular;  Laterality: Left;   EYE SURGERY     bilateral cataract   KELOID EXCISION  04/2008   on chest scar; Dr. Dessie Coma   KELOID EXCISION     PILONIDAL CYST EXCISION  1989   Family History  Problem Relation Age of Onset   Heart disease Mother        Before age 12   Diabetes Mother    Kidney disease Mother    Heart attack Mother 52   Lung cancer Father 50   Diabetes Brother    Heart disease Brother    Heart disease Brother    Arthritis Brother    Diabetes Sister    Fibromyalgia Sister    Lung cancer Paternal Uncle        questionable as to if it was lung ca   Healthy Daughter    Colon cancer Neg Hx    Stroke Neg Hx    Social History   Socioeconomic History   Marital status: Married    Spouse name: [name]   Number of children: 1   Years of education: Not on file   Highest education level: Some college, no degree  Occupational History   Occupation: retired  Tobacco Use   Smoking status: Never   Smokeless tobacco: Current    Types: Snuff   Tobacco comments:    uses 2 dip/week  Vaping Use   Vaping Use: Never used  Substance and Sexual Activity   Alcohol use: No    Alcohol/week: 0.0 standard drinks   Drug use: No   Sexual activity: Yes  Other Topics Concern   Not on file  Social History Narrative   Retired.   Once worked for the [employer].   Married.   Enjoys reading, spending time with family.    Left handed   Drinks caffeine   One story home   Social Determinants of Health   Financial Resource Strain: Low Risk    Difficulty of Paying Living Expenses: Not hard at all  Food Insecurity: No Food Insecurity   Worried About Charity fundraiser in the Last Year: Never true   Arboriculturist in the Last Year: Never true  Transportation Needs: No Transportation Needs   Lack  of Transportation (Medical): No   Lack of Transportation (Non-Medical): No  Physical Activity: Insufficiently Active   Days of Exercise per Week: 1 day   Minutes of Exercise per Session: 20 min  Stress: No Stress Concern Present   Feeling of Stress : Not at all  Social Connections: Moderately Integrated   Frequency of Communication with Friends and Family: More than three times a week   Frequency of Social Gatherings with Friends and Family: More than three times a week   Attends  Religious Services: More than 4 times per year   Active Member of Clubs or Organizations: No   Attends Archivist Meetings: Never   Marital Status: Married    Tobacco Counseling Ready to quit: Not Answered Counseling given: Not Answered Tobacco comments: uses 2 dip/week   Clinical Intake:  Pre-visit preparation completed: Yes  Pain : No/denies pain     Nutritional Risks: None Diabetes: Yes CBG done?: No Did pt. bring in CBG monitor from home?: No  How often do you need to have someone help you when you read instructions, pamphlets, or other written materials from your doctor or pharmacy?: 1 - Never  Diabetic: Yes Nutrition Risk Assessment:  Has the patient had any N/V/D within the last 2 months?  No  Does the patient have any non-healing wounds?  No  Has the patient had any unintentional weight loss or weight gain?  No   Diabetes:  Is the patient diabetic?  Yes  If diabetic, was a CBG obtained today?  No  telephone visit  Did the patient bring in their glucometer from home?  No  telephone visit  How often do you monitor your CBG's? Mornings on occasion.   Financial Strains and Diabetes Management:  Are you having any financial strains with the device, your supplies or your medication? No .  Does the patient want to be seen by Chronic Care Management for management of their diabetes?  No  Would the patient like to be referred to a Nutritionist or for Diabetic Management?  No    Diabetic Exams:  Diabetic Eye Exam: Completed 10/28/2020 Diabetic Foot Exam: Completed 09/09/2020   Interpreter Needed?: No  Information entered by :: CJohnson, LPN   Activities of Daily Living In your present state of health, do you have any difficulty performing the following activities: 06/13/2021 05/16/2021  Hearing? Y Y  Comment some hearing loss noted -  Vision? N N  Difficulty concentrating or making decisions? N N  Walking or climbing stairs? N N  Dressing or bathing? N N  Doing errands, shopping? N N  Preparing Food and eating ? N -  Using the Toilet? N -  In the past six months, have you accidently leaked urine? Y -  Comment wears a depends at nighttime -  Do you have problems with loss of bowel control? N -  Managing your Medications? N -  Managing your Finances? N -  Housekeeping or managing your Housekeeping? N -  Some recent data might be hidden    Patient Care Team: Pleas Koch, NP as PCP - General (Internal Medicine) Josue Hector, MD as PCP - Cardiology (Cardiology) Josue Hector, MD as Attending Physician (Cardiology) Pieter Partridge, DO as Consulting Physician (Neurology)  Indicate any recent Medical Services you may have received from other than [facility] providers in the past year (date may be approximate).     Assessment:   This is a routine wellness examination for Hallis.  Hearing/Vision screen Vision Screening - Comments:: Patient gets annual eye exams   Dietary issues and exercise activities discussed: Current Exercise Habits: Home exercise routine, Type of exercise: walking, Time (Minutes): 20, Frequency (Times/Week): 7, Weekly Exercise (Minutes/Week): 140, Intensity: Moderate, Exercise limited by: None identified   Goals Addressed             This Visit's Progress    Patient Stated       06/13/2021, I will continue walking 20 minutes everyday and PT once a  week for 1 hour.         Depression Screen PHQ 2/9 Scores  06/13/2021 04/24/2021 03/10/2021 08/01/2016 07/28/2014 07/02/2013  PHQ - 2 Score 0 0 0 0 0 0  PHQ- 9 Score 0 - 2 - - -    Fall Risk Fall Risk  06/13/2021 04/24/2021 04/18/2021 03/10/2021 10/20/2019  Falls in the past year? 0 0 0 1 0  Number falls in past yr: 0 - 0 0 0  Injury with Fall? 0 - 0 0 0  Risk for fall due to : Impaired balance/gait;Medication side effect - - - -  Follow up Falls evaluation completed;Falls prevention discussed - - - -    FALL RISK PREVENTION PERTAINING TO THE HOME:  Any stairs in or around the home? Yes  If so, are there any without handrails? No  Home free of loose throw rugs in walkways, pet beds, electrical cords, etc? Yes  Adequate lighting in your home to reduce risk of falls? Yes   ASSISTIVE DEVICES UTILIZED TO PREVENT FALLS:  Life alert? No  Use of a cane, walker or w/c? Yes  Grab bars in the bathroom? No  Shower chair or bench in shower? No  Elevated toilet seat or a handicapped toilet? No   TIMED UP AND GO:  Was the test performed?  N/A telephone visit .   Cognitive Function: MMSE - Mini Mental State Exam 06/13/2021  Orientation to time 5  Orientation to Place 5  Registration 3  Attention/ Calculation 5  Recall 3  Language- repeat 1       Mini Cog  Mini-Cog screen was completed. Maximum score is 22. A value of 0 denotes this part of the MMSE was not completed or the patient failed this part of the Mini-Cog screening.  Immunizations Immunization History  Administered Date(s) Administered   Fluad Quad(high Dose 65+) 03/10/2021   H1N1 02/03/2009   Influenza Split 10/30/2011, 10/02/2012, 11/30/2013, 11/30/2017   Influenza Whole 10/18/2009   Influenza, High Dose Seasonal PF 11/30/2016, 11/12/2018, 10/03/2019   Influenza,inj,Quad PF,6+ Mos 01/28/2015, 01/30/2016   PFIZER(Purple Top)SARS-COV-2 Vaccination 02/14/2020, 03/08/2020, 11/18/2020   Pneumococcal Conjugate-13 08/13/2017   Pneumococcal Polysaccharide-23 10/01/1999, 11/17/2009   Td  10/01/1999, 05/16/2010    TDAP status: Due, Education has been provided regarding the importance of this vaccine. Advised may receive this vaccine at local pharmacy or Health Dept. Aware to provide a copy of the vaccination record if obtained from local pharmacy or Health Dept. Verbalized acceptance and understanding.  Flu Vaccine status: Up to date  Pneumococcal vaccine status: Up to date  Covid-19 vaccine status: Completed 3 vaccines, will discuss second booster with provider  Qualifies for Shingles Vaccine? Yes   Zostavax completed No   Shingrix Completed?: No.    Education has been provided regarding the importance of this vaccine. Patient has been advised to call insurance company to determine out of pocket expense if they have not yet received this vaccine. Advised may also receive vaccine at local pharmacy or Health Dept. Verbalized acceptance and understanding.  Screening Tests Health Maintenance  Topic Date Due   Hepatitis C Screening  Never done   Zoster Vaccines- Shingrix (1 of 2) Never done   TETANUS/TDAP  05/16/2020   COVID-19 Vaccine (4 - Booster for Pfizer series) 02/18/2021   INFLUENZA VACCINE  07/31/2021   FOOT EXAM  09/09/2021   OPHTHALMOLOGY EXAM  10/28/2021   HEMOGLOBIN A1C  11/19/2021   PNA vac Low Risk Adult  Completed  HPV VACCINES  Aged Out    Health Maintenance  Health Maintenance Due  Topic Date Due   Hepatitis C Screening  Never done   Zoster Vaccines- Shingrix (1 of 2) Never done   TETANUS/TDAP  05/16/2020   COVID-19 Vaccine (4 - Booster for Pfizer series) 02/18/2021    Colorectal cancer screening: Type of screening: Colonoscopy. Completed 02/11/2012. Repeat every 10 years  Lung Cancer Screening: (Low Dose CT Chest recommended if Age 57-80 years, 30 pack-year currently smoking OR have quit w/in 15 years.) does not qualify.    Additional Screening:  Hepatitis C Screening: does qualify; Completed due  Vision Screening: Recommended annual  ophthalmology exams for early detection of glaucoma and other disorders of the eye. Is the patient up to date with their annual eye exam?  Yes  Who is the provider or what is the name of the office in which the patient attends annual eye exams? Dr. Prudencio Burly, [facility] If pt is not established with a provider, would they like to be referred to a provider to establish care? No .   Dental Screening: Recommended annual dental exams for proper oral hygiene  Community Resource Referral / Chronic Care Management: CRR required this visit?  No   CCM required this visit?  No      Plan:     I have personally reviewed and noted the following in the patient's chart:   Medical and social history Use of alcohol, tobacco or illicit drugs  Current medications and supplements including opioid prescriptions. Patient is not currently taking opioid prescriptions. Functional ability and status Nutritional status Physical activity Advanced directives List of other physicians Hospitalizations, surgeries, and ER visits in previous 12 months Vitals Screenings to include cognitive, depression, and falls Referrals and appointments  In addition, I have reviewed and discussed with patient certain preventive protocols, quality metrics, and best practice recommendations. A written personalized care plan for preventive services as well as general preventive health recommendations were provided to patient.   Due to this being a telephonic visit, the after visit summary with patients personalized plan was offered to patient via office or my-chart. Patient preferred to pick up at office at next visit or via mychart.   Andrez Grime, LPN   [id number]/[id number]

## 2021-06-13 NOTE — Patient Instructions (Signed)
[name] , Thank you for taking time to come for your Medicare Wellness Visit. I appreciate your ongoing commitment to your health goals. Please review the following plan we discussed and let me know if I can assist you in the future.   Screening recommendations/referrals: Colonoscopy: Up to date, completed 02/11/2012, due 01/2022 Recommended yearly ophthalmology/optometry visit for glaucoma screening and checkup Recommended yearly dental visit for hygiene and checkup  Vaccinations: Influenza vaccine: Up to date, completed 03/10/2021, due 07/2021 Pneumococcal vaccine: Completed series Tdap vaccine: decline-insurance  Shingles vaccine: due, check with your insurance regarding coverage if interested    Covid-19: 3 vaccines completed, second booster due, will discuss with provider   Advanced directives: Advance directive discussed with you today. Even though you declined this today please call our office should you change your mind and we can give you the proper paperwork for you to fill out.  Conditions/risks identified: diabetes, hypertension  Next appointment: Follow up in one year for your annual wellness visit.   Preventive Care 78 Years and Older, Male Preventive care refers to lifestyle choices and visits with your health care provider that can promote health and wellness. What does preventive care include? A yearly physical exam. This is also called an annual well check. Dental exams once or twice a year. Routine eye exams. Ask your health care provider how often you should have your eyes checked. Personal lifestyle choices, including: Daily care of your teeth and gums. Regular physical activity. Eating a healthy diet. Avoiding tobacco and drug use. Limiting alcohol use. Practicing safe sex. Taking low doses of aspirin every day. Taking vitamin and mineral supplements as recommended by your health care provider. What happens during an annual well check? The services and  screenings done by your health care provider during your annual well check will depend on your age, overall health, lifestyle risk factors, and family history of disease. Counseling  Your health care provider may ask you questions about your: Alcohol use. Tobacco use. Drug use. Emotional well-being. Home and relationship well-being. Sexual activity. Eating habits. History of falls. Memory and ability to understand (cognition). Work and work Statistician. Screening  You may have the following tests or measurements: Height, weight, and BMI. Blood pressure. Lipid and cholesterol levels. These may be checked every 5 years, or more frequently if you are over 63 years old. Skin check. Lung cancer screening. You may have this screening every year starting at age 47 if you have a 30-pack-year history of smoking and currently smoke or have quit within the past 15 years. Fecal occult blood test (FOBT) of the stool. You may have this test every year starting at age 34. Flexible sigmoidoscopy or colonoscopy. You may have a sigmoidoscopy every 5 years or a colonoscopy every 10 years starting at age 41. Prostate cancer screening. Recommendations will vary depending on your family history and other risks. Hepatitis C blood test. Hepatitis B blood test. Sexually transmitted disease (STD) testing. Diabetes screening. This is done by checking your blood sugar (glucose) after you have not eaten for a while (fasting). You may have this done every 1-3 years. Abdominal aortic aneurysm (AAA) screening. You may need this if you are a current or former smoker. Osteoporosis. You may be screened starting at age 54 if you are at high risk. Talk with your health care provider about your test results, treatment options, and if necessary, the need for more tests. Vaccines  Your health care provider may recommend certain vaccines, such as: Influenza  vaccine. This is recommended every year. Tetanus, diphtheria, and  acellular pertussis (Tdap, Td) vaccine. You may need a Td booster every 10 years. Zoster vaccine. You may need this after age 24. Pneumococcal 13-valent conjugate (PCV13) vaccine. One dose is recommended after age 19. Pneumococcal polysaccharide (PPSV23) vaccine. One dose is recommended after age 82. Talk to your health care provider about which screenings and vaccines you need and how often you need them. This information is not intended to replace advice given to you by your health care provider. Make sure you discuss any questions you have with your health care provider. Document Released: 01/13/2016 Document Revised: 09/05/2016 Document Reviewed: 10/18/2015 Elsevier Interactive Patient Education  2017 [place] Prevention in the Home Falls can cause injuries. They can happen to people of all ages. There are many things you can do to make your home safe and to help prevent falls. What can I do on the outside of my home? Regularly fix the edges of walkways and driveways and fix any cracks. Remove anything that might make you trip as you walk through a door, such as a raised step or threshold. Trim any bushes or trees on the path to your home. Use bright outdoor lighting. Clear any walking paths of anything that might make someone trip, such as rocks or tools. Regularly check to see if handrails are loose or broken. Make sure that both sides of any steps have handrails. Any raised decks and porches should have guardrails on the edges. Have any leaves, snow, or ice cleared regularly. Use sand or salt on walking paths during winter. Clean up any spills in your garage right away. This includes oil or grease spills. What can I do in the bathroom? Use night lights. Install grab bars by the toilet and in the tub and shower. Do not use towel bars as grab bars. Use non-skid mats or decals in the tub or shower. If you need to sit down in the shower, use a plastic, non-slip stool. Keep  the floor dry. Clean up any water that spills on the floor as soon as it happens. Remove soap buildup in the tub or shower regularly. Attach bath mats securely with double-sided non-slip rug tape. Do not have throw rugs and other things on the floor that can make you trip. What can I do in the bedroom? Use night lights. Make sure that you have a light by your bed that is easy to reach. Do not use any sheets or blankets that are too big for your bed. They should not hang down onto the floor. Have a firm chair that has side arms. You can use this for support while you get dressed. Do not have throw rugs and other things on the floor that can make you trip. What can I do in the kitchen? Clean up any spills right away. Avoid walking on wet floors. Keep items that you use a lot in easy-to-reach places. If you need to reach something above you, use a strong step stool that has a grab bar. Keep electrical cords out of the way. Do not use floor polish or wax that makes floors slippery. If you must use wax, use non-skid floor wax. Do not have throw rugs and other things on the floor that can make you trip. What can I do with my stairs? Do not leave any items on the stairs. Make sure that there are handrails on both sides of the stairs and use them. Fix  handrails that are broken or loose. Make sure that handrails are as long as the stairways. Check any carpeting to make sure that it is firmly attached to the stairs. Fix any carpet that is loose or worn. Avoid having throw rugs at the top or bottom of the stairs. If you do have throw rugs, attach them to the floor with carpet tape. Make sure that you have a light switch at the top of the stairs and the bottom of the stairs. If you do not have them, ask someone to add them for you. What else can I do to help prevent falls? Wear shoes that: Do not have high heels. Have rubber bottoms. Are comfortable and fit you well. Are closed at the toe. Do not  wear sandals. If you use a stepladder: Make sure that it is fully opened. Do not climb a closed stepladder. Make sure that both sides of the stepladder are locked into place. Ask someone to hold it for you, if possible. Clearly mark and make sure that you can see: Any grab bars or handrails. First and last steps. Where the edge of each step is. Use tools that help you move around (mobility aids) if they are needed. These include: Canes. Walkers. Scooters. Crutches. Turn on the lights when you go into a dark area. Replace any light bulbs as soon as they burn out. Set up your furniture so you have a clear path. Avoid moving your furniture around. If any of your floors are uneven, fix them. If there are any pets around you, be aware of where they are. Review your medicines with your doctor. Some medicines can make you feel dizzy. This can increase your chance of falling. Ask your doctor what other things that you can do to help prevent falls. This information is not intended to replace advice given to you by your health care provider. Make sure you discuss any questions you have with your health care provider. Document Released: 10/13/2009 Document Revised: 05/24/2016 Document Reviewed: 01/21/2015 Elsevier Interactive Patient Education  2017 [employer].

## 2021-06-13 NOTE — Progress Notes (Signed)
PCP notes:  Health Maintenance: Tdap- insurance [employer]- due Covid- second booster due   Abnormal Screenings: none   Patient concerns: Discuss dosage of Lipitor Bilateral foot edema  Wants CRP, PSA, B12, Vit D and A1C checked at physical    Nurse concerns: none   Next PCP appt.: 06/15/2021 @ 8:20 am

## 2021-06-14 ENCOUNTER — Other Ambulatory Visit: Payer: Self-pay

## 2021-06-14 ENCOUNTER — Ambulatory Visit: Payer: Medicare PPO

## 2021-06-14 DIAGNOSIS — M6281 Muscle weakness (generalized): Secondary | ICD-10-CM | POA: Diagnosis not present

## 2021-06-14 DIAGNOSIS — R2689 Other abnormalities of gait and mobility: Secondary | ICD-10-CM | POA: Diagnosis not present

## 2021-06-14 DIAGNOSIS — I639 Cerebral infarction, unspecified: Secondary | ICD-10-CM | POA: Diagnosis not present

## 2021-06-14 DIAGNOSIS — I6381 Other cerebral infarction due to occlusion or stenosis of small artery: Secondary | ICD-10-CM

## 2021-06-14 NOTE — Therapy (Signed)
[facility] [address] [place], [place], [address] Phone: [phone]   Fax:  [phone]  Physical Therapy Treatment  Patient Details  Name: [name] [name] MRN: [id number] Date of Birth: 01-31-1941 Referring Provider (PT): Dr. Alysia Penna   Encounter Date: 06/14/2021   PT End of Session - 06/14/21 0943     Visit Number 8    Number of Visits 12    Date for PT Re-Evaluation 07/05/21    Authorization Type [employer]; Recert 6/81/27 to 04/30/69    Authorization Time Period Plan for 4 visits and then assess for discharge    PT Start Time 0930    PT Stop Time 1015    PT Time Calculation (min) 45 min    Equipment Utilized During Treatment Gait belt    Activity Tolerance Patient tolerated treatment well    Behavior During Therapy [facility] for tasks assessed/performed             Past Medical History:  Diagnosis Date   Basal cell carcinoma 11/09/2019   nod & infil-behind right ear-cx3 &exc   Basal cell carcinoma 03/21/2020   Residual BCC with peripheral margin involved - ST recommends [facility]   Carotid artery occlusion    Coronary artery disease    s/p CABG 2005; sees Dr Johnsie Cancel yearly   Diabetes mellitus without complication ([place])    Gynecomastia    Hypercholesterolemia    Hypertension    Neurodermatitis    Overweight(278.02)    Personal history of colonic polyps 10/08/2006   tubular adenomas   Renal insufficiency    Stroke [facility])    TIA  April 2022   Transient ischemic attack 2010   "lasted ~ 5 seconds"   Vitamin D deficiency     Past Surgical History:  Procedure Laterality Date   CARDIAC CATHETERIZATION  02/2004   "tried to stent; couldn't"   CAROTID ENDARTERECTOMY Right 08/26/2015   COLONOSCOPY     CORONARY ANGIOPLASTY     CORONARY ARTERY BYPASS GRAFT  Feb. 2005   4 vessel   ENDARTERECTOMY Right 08/26/2015   Procedure: Right Carotid ENDARTERECTOMY with Patch Angioplasty ;  Surgeon: Rosetta Posner, MD;   [facility] OR;  Service: Vascular;  Laterality: Right;   ENDARTERECTOMY Left 05/18/2021   Procedure: LEFT CAROTID ARTERY ENDARTERECTOMY  with patch angioplasty;  Surgeon: Rosetta Posner, MD;  Location[facility] OR;  Service: Vascular;  Laterality: Left;   EYE SURGERY     bilateral cataract   KELOID EXCISION  04/2008   on chest scar; Dr. Dessie Coma   KELOID EXCISION     PILONIDAL CYST EXCISION  1989    There were no vitals filed for this visit.   Subjective Assessment - 06/14/21 1013     Subjective Pt reports I am feeling better. I ate before I came today.    Currently in Pain? No/denies                  Gait training: 1 x 630' cues for heel to toe walking, and swinging arms, no AD Pt and wife reports that he has hard time performing sit to stand and can't get up without pushing off with UE. Pt educated on "Nose over toes": when pt did "nose over toes" he was able to stand up without HHA from standard chair: Sit to stand: 15x Prone to quadruped: pt could only perform 5 reps before arms were fatigued Sci Fit: level 8 for 10'  PT Short Term Goals - 05/17/21 0949       PT SHORT TERM GOAL #1   Title Pt will be able to walk 20 min for at least 5 days a week to improve walking endurance.    Baseline No walking    Time 4    Period Weeks    Status Revised    Target Date 06/14/21               PT Long Term Goals - 06/14/21 1014       PT LONG TERM GOAL #1   Title Pt will demo 5x sit to stand in 16 sec or less to improve functional strength    Baseline 21 sec (04/19/21); 10 sec (05/17/21)    Time 6    Period Weeks    Status Achieved      PT LONG TERM GOAL #2   Title Pt will be I and compliant with walking program to independently manage symptoms at home.    Baseline Walking progrm issued on 04/19/21; pt is compliant with 5 days a week and walking 16 min/day this week (05/17/21); pt is upto 20 min of walking every day now. (06/14/21)     Time 6    Period Weeks    Status Achieved      PT LONG TERM GOAL #3   Title Pt will demo 85 points on LE CVA on FOTO to improve overall function    Baseline 74 (4/20/2)    Time 7    Period Weeks    Status On-going      PT LONG TERM GOAL #4   Title Pt will demo 0.28m/s walking speed to improve fall risk and community ambulation    Baseline 0.87m/s (04/19/21) no AD; 0.74 m/s (05/17/21); 0.83 m/s (06/06/21)    Time 7    Period Weeks    Status On-going                   Plan - 06/14/21 1015     Clinical Impression Statement Today's skilled session was focused on improving quality of gait and educating paitnet on paying attention with lifting his toes up more during swing phase of gait and swinging arms for improved balance and stability. Pt demonstrated improved gait speed with improved arm swings. We also focused on functional movements of prone to quadruped.    Personal Factors and Comorbidities Age;Time since onset of injury/illness/exacerbation;Past/Current Experience    Examination-Activity Limitations Lift;Stairs;Squat    Examination-Participation Restrictions Church;Community Activity;Driving;Shop;Yard Work;Cleaning    Stability/Clinical Decision Making Stable/Uncomplicated    Rehab Potential Good    PT Frequency 1x / week    PT Duration Other (comment)   7 weeks   PT Treatment/Interventions ADLs/Self Care Home Management;Gait training;Stair training;Functional mobility training;Therapeutic activities;Therapeutic exercise;Balance training;Neuromuscular re-education;Patient/family education;Orthotic Fit/Training;Manual techniques;Passive range of motion;Energy conservation;Joint Manipulations    PT Next Visit Plan Add more sessions.    PT Home Exercise Plan Access Code [email or web address]    Consulted and Agree with Plan of Care Patient;Family member/caregiver             Patient will benefit from skilled therapeutic intervention in order to improve the following deficits and  impairments:  Abnormal gait, Decreased activity tolerance, Decreased balance, Decreased endurance, Decreased mobility, Difficulty walking, Decreased strength, Postural dysfunction  Visit Diagnosis: Muscle weakness (generalized)  Right thalamic infarction [facility])  Other abnormalities of gait and mobility     Problem List Patient Active Problem List  Diagnosis Date Noted   Carotid stenosis, asymptomatic, left 05/18/2021   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   TIA (transient ischemic attack) 04/03/2021   Lower extremity edema 08/13/2019   Unsteady gait 06/09/2019   Preventative health care 06/09/2019   Hyperlipidemia 01/30/2016   Carotid stenosis 08/26/2015   Anxiety, 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   Carotid artery disease ([place]) 07/01/2012   Special screening for malignant neoplasm of prostate 05/08/2011   Vitamin D deficiency 11/20/2009   Transient cerebral ischemia 06/30/2009   CEREBROVASCULAR DISEASE 06/30/2009   COLONIC POLYPS 01/10/2008   Essential hypertension 01/10/2008   Coronary atherosclerosis 01/10/2008   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

## 2021-06-15 ENCOUNTER — Other Ambulatory Visit: Payer: Self-pay | Admitting: Primary Care

## 2021-06-15 ENCOUNTER — Other Ambulatory Visit: Payer: Self-pay

## 2021-06-15 ENCOUNTER — Ambulatory Visit (INDEPENDENT_AMBULATORY_CARE_PROVIDER_SITE_OTHER): Payer: Medicare PPO | Admitting: Primary Care

## 2021-06-15 ENCOUNTER — Encounter: Payer: Self-pay | Admitting: Primary Care

## 2021-06-15 VITALS — BP 145/82 | HR 92 | Temp 98.0°F | Ht 73.0 in | Wt 224.0 lb

## 2021-06-15 DIAGNOSIS — E559 Vitamin D deficiency, unspecified: Secondary | ICD-10-CM | POA: Diagnosis not present

## 2021-06-15 DIAGNOSIS — Z125 Encounter for screening for malignant neoplasm of prostate: Secondary | ICD-10-CM

## 2021-06-15 DIAGNOSIS — Z0001 Encounter for general adult medical examination with abnormal findings: Secondary | ICD-10-CM

## 2021-06-15 DIAGNOSIS — F419 Anxiety disorder, unspecified: Secondary | ICD-10-CM | POA: Diagnosis not present

## 2021-06-15 DIAGNOSIS — I6381 Other cerebral infarction due to occlusion or stenosis of small artery: Secondary | ICD-10-CM

## 2021-06-15 DIAGNOSIS — I10 Essential (primary) hypertension: Secondary | ICD-10-CM

## 2021-06-15 DIAGNOSIS — E119 Type 2 diabetes mellitus without complications: Secondary | ICD-10-CM

## 2021-06-15 DIAGNOSIS — E538 Deficiency of other specified B group vitamins: Secondary | ICD-10-CM | POA: Diagnosis not present

## 2021-06-15 DIAGNOSIS — Z8673 Personal history of transient ischemic attack (TIA), and cerebral infarction without residual deficits: Secondary | ICD-10-CM

## 2021-06-15 DIAGNOSIS — E7849 Other hyperlipidemia: Secondary | ICD-10-CM

## 2021-06-15 DIAGNOSIS — I6522 Occlusion and stenosis of left carotid artery: Secondary | ICD-10-CM | POA: Diagnosis not present

## 2021-06-15 LAB — COMPREHENSIVE METABOLIC PANEL
ALT: 18 U/L (ref 0–53)
AST: 18 U/L (ref 0–37)
Albumin: 3.9 g/dL (ref 3.5–5.2)
Alkaline Phosphatase: 106 U/L (ref 39–117)
BUN: 17 mg/dL (ref 6–23)
CO2: 30 mEq/L (ref 19–32)
Calcium: 9 mg/dL (ref 8.4–10.5)
Chloride: 104 mEq/L (ref 96–112)
Creatinine, Ser: 1.17 mg/dL (ref 0.40–1.50)
GFR: 60.09 mL/min (ref >60.00)
Glucose, Bld: 128 mg/dL — ABNORMAL HIGH (ref 70–99)
Potassium: 4.8 mEq/L (ref 3.5–5.1)
Sodium: 140 mEq/L (ref 135–145)
Total Bilirubin: 0.9 mg/dL (ref 0.2–1.2)
Total Protein: 6.3 g/dL (ref 6.0–8.3)

## 2021-06-15 LAB — VITAMIN D 25 HYDROXY (VIT D DEFICIENCY, FRACTURES): VITD: 30.45 ng/mL (ref 30.00–100.00)

## 2021-06-15 LAB — VITAMIN B12: Vitamin B-12: 1013 pg/mL — ABNORMAL HIGH (ref 211–911)

## 2021-06-15 LAB — PSA, MEDICARE: PSA: 2.26 ng/ml (ref 0.10–4.00)

## 2021-06-15 MED ORDER — FREESTYLE LIBRE 14 DAY SENSOR MISC: 1 refills | Status: DC

## 2021-06-15 MED ORDER — HYDROCHLOROTHIAZIDE 12.5 MG PO TABS: 12.5000 mg | ORAL_TABLET | Freq: Every day | ORAL | 3 refills | Status: DC

## 2021-06-15 MED ORDER — FREESTYLE LIBRE 14 DAY READER DEVI: 0 refills | Status: DC

## 2021-06-15 NOTE — Telephone Encounter (Signed)
Is insurance requesting Dexcom? Okay to order.  Also notify patient that I sent in his hydrochlorothiazide BP medication to the pharmacy, okay to resume. Continue all other medications.

## 2021-06-15 NOTE — Progress Notes (Signed)
Subjective:    Patient ID: [name] [name], male    DOB: 05-18-1941, 80 y.o.   MRN: [id number]  HPI  [name] [name] is a very pleasant 80 y.o. male who presents today for complete physical and hospital follow up.   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 Admitted to inpatient rehab on April 7 and underwent inpatient PT/OT/ST. He was discharged home on 04/13 with recommendations for outpatient neurology follow up and outpatient vascular services follow up. He was initiated on aspirin 81 mg and clopidogrel 75 mg x 3 weeks, then plan to   He underwent left carotid endarterectomy on 05/18/21 per Dr. Donnetta Hutching, tolerated well, no complications. Discharged home on 05/19/21. Today he endorses that he is on Plavix alone. He has an appointment scheduled with Dr. Donnetta Hutching for next week.   He is checking his BP at home infrequently which runs 130's-160's/80's-90's. He is compliant to his losartan 50 mg and carvedilol 3.125 mg BID.  He endorses that his "diuretic" was discontinued in the hospital, he's not sure why. His atorvastatin was increased to 40 mg, since then he's noticed some muscle aches. Previously managed on atorvastatin 10 mg.   Immunizations: -Tetanus: 2011 -Influenza: Completed last season  -Covid-19: 3 vaccines -Shingles: Never completed  -Pneumonia: Prevnar 2018, Pneumovax in 2011  Diet: [place].  Exercise: No regular exercise.  Eye exam: Completes annually  Dental exam: Completes semi-annually   Colonoscopy: Completed in 2013, due in 2023 PSA: Due  BP Readings from Last 3 Encounters:  06/15/21 (!) 145/82  05/30/21 (!) 161/90  05/19/21 132/66       Review of Systems  Constitutional:  Negative for unexpected weight change.  HENT:  Negative for rhinorrhea.   Eyes:  Negative for visual disturbance.  Respiratory:  Negative for cough and  shortness of breath.   Cardiovascular:  Negative for chest pain.  Gastrointestinal:  Negative for constipation and diarrhea.  Genitourinary:  Negative for difficulty urinating.  Musculoskeletal:  Positive for myalgias.  Skin:  Negative for rash.  Allergic/Immunologic: Negative for environmental allergies.  Neurological:  Positive for numbness. Negative for dizziness and headaches.  Psychiatric/Behavioral:  The patient is not nervous/anxious.         Past Medical History:  Diagnosis Date   Basal cell carcinoma 11/09/2019   nod & infil-behind right ear-cx3 &exc   Basal cell carcinoma 03/21/2020   Residual BCC with peripheral margin involved - ST recommends MOHs   Carotid artery occlusion    Coronary artery disease    s/p CABG 2005; sees Dr Johnsie Cancel yearly   Diabetes mellitus without complication ([place])    Gynecomastia    Hypercholesterolemia    Hypertension    Neurodermatitis    Overweight(278.02)    Personal history of colonic polyps 10/08/2006   tubular adenomas   Renal insufficiency    Stroke [facility])    TIA  April 2022   Transient ischemic attack 2010   "lasted ~ 5 seconds"   Vitamin D deficiency     Social History   Socioeconomic History   Marital status: Married    Spouse name: [name]   Number of children: 1   Years of education: Not on file   Highest education level: Some college, no degree  Occupational History   Occupation: retired  Tobacco Use   Smoking status: Never   Smokeless tobacco: Current  Types: Snuff   Tobacco comments:    uses 2 dip/week  Vaping Use   Vaping Use: Never used  Substance and Sexual Activity   Alcohol use: No    Alcohol/week: 0.0 standard drinks   Drug use: No   Sexual activity: Yes  Other Topics Concern   Not on file  Social History Narrative   Retired.   Once worked for the [employer].   Married.   Enjoys reading, spending time with family.    Left handed   Drinks caffeine   One story home   Social Determinants of Health    Financial Resource Strain: Low Risk    Difficulty of Paying Living Expenses: Not hard at all  Food Insecurity: No Food Insecurity   Worried About Charity fundraiser in the Last Year: Never true   Arboriculturist in the Last Year: Never true  Transportation Needs: No Transportation Needs   Lack of Transportation (Medical): No   Lack of Transportation (Non-Medical): No  Physical Activity: Insufficiently Active   Days of Exercise per Week: 1 day   Minutes of Exercise per Session: 20 min  Stress: No Stress Concern Present   Feeling of Stress : Not at all  Social Connections: Moderately Integrated   Frequency of Communication with Friends and Family: More than three times a week   Frequency of Social Gatherings with Friends and Family: More than three times a week   Attends Religious Services: More than 4 times per year   Active Member of Clubs or Organizations: No   Attends Archivist Meetings: Never   Marital Status: Married  Human resources officer Violence: Not on file    Past Surgical History:  Procedure Laterality Date   CARDIAC CATHETERIZATION  02/2004   "tried to stent; couldn't"   CAROTID ENDARTERECTOMY Right 08/26/2015   COLONOSCOPY     CORONARY ANGIOPLASTY     CORONARY ARTERY BYPASS GRAFT  Feb. 2005   4 vessel   ENDARTERECTOMY Right 08/26/2015   Procedure: Right Carotid ENDARTERECTOMY with Patch Angioplasty ;  Surgeon: Rosetta Posner, MD;  Location: [facility] OR;  Service: Vascular;  Laterality: Right;   ENDARTERECTOMY Left 05/18/2021   Procedure: LEFT CAROTID ARTERY ENDARTERECTOMY  with patch angioplasty;  Surgeon: Rosetta Posner, MD;  Location: [facility] OR;  Service: Vascular;  Laterality: Left;   EYE SURGERY     bilateral cataract   KELOID EXCISION  04/2008   on chest scar; Dr. Dessie Coma   KELOID EXCISION     PILONIDAL CYST EXCISION  1989    Family History  Problem Relation Age of Onset   Heart disease Mother        Before age 62   Diabetes Mother    Kidney disease  Mother    Heart attack Mother 79   Lung cancer Father 21   Diabetes Brother    Heart disease Brother    Heart disease Brother    Arthritis Brother    Diabetes Sister    Fibromyalgia Sister    Lung cancer Paternal Uncle        questionable as to if it was lung ca   Healthy Daughter    Colon cancer Neg Hx    Stroke Neg Hx     Allergies  Allergen Reactions   Niacin Other (See Comments)    intol to NIACIN w/ headaches     Current Outpatient Medications on File Prior to Visit  Medication Sig Dispense Refill  acetaminophen (TYLENOL) 325 MG tablet Take 2 tablets (650 mg total) by mouth every 4 (four) hours as needed for mild pain (or temp > 37.5 C (99.5 F)).     atorvastatin (LIPITOR) 40 MG tablet Take 1 tablet (40 mg total) by mouth daily. For cholesterol 90 tablet 3   carvedilol (COREG) 3.125 MG tablet TAKE 1 TABLET (3.125 MG TOTAL) BY MOUTH 2 (TWO) TIMES DAILY WITH A MEAL. FOR BLOOD PRESSURE 180 tablet 0   cholecalciferol (VITAMIN D3) 25 MCG (1000 UNIT) tablet Take 1,000 Units by mouth daily.     clopidogrel (PLAVIX) 75 MG tablet Take 1 tablet (75 mg total) by mouth daily. 90 tablet 3   losartan (COZAAR) 25 MG tablet Take 2 tablets (50 mg total) by mouth daily. 180 tablet 1   nitroGLYCERIN (NITROSTAT) 0.4 MG SL tablet Place 1 tablet (0.4 mg total) under the tongue every 5 (five) minutes as needed for chest pain. 25 tablet 3   vitamin B-12 (CYANOCOBALAMIN) 1000 MCG tablet Take 1,000 mcg by mouth daily.     No current facility-administered medications on file prior to visit.    BP (!) 145/82   Pulse 92   Temp 98 F (36.7 C) (Temporal)   Ht 6\' 1"  (1.854 m)   Wt 224 lb (101.6 kg)   SpO2 98%   BMI 29.55 kg/m  Objective:   Physical Exam HENT:     Right Ear: Tympanic membrane and ear canal normal.     Left Ear: Tympanic membrane and ear canal normal.     Nose: Nose normal.     Right Sinus: No maxillary sinus tenderness or frontal sinus tenderness.     Left Sinus: No  maxillary sinus tenderness or frontal sinus tenderness.  Eyes:     Conjunctiva/sclera: Conjunctivae normal.     Pupils: Pupils are equal, round, and reactive to light.  Neck:     Thyroid: No thyromegaly.     Vascular: No carotid bruit.  Cardiovascular:     Rate and Rhythm: Normal rate and regular rhythm.     Heart sounds: Normal heart sounds.  Pulmonary:     Effort: Pulmonary effort is normal.     Breath sounds: Normal breath sounds. No wheezing or rales.  Abdominal:     General: Bowel sounds are normal.     Palpations: Abdomen is soft.     Tenderness: There is no abdominal tenderness.  Musculoskeletal:        General: Normal range of motion.     Cervical back: Neck supple.  Skin:    General: Skin is warm and dry.  Neurological:     Mental Status: He is alert and oriented to person, place, and time.     Cranial Nerves: No cranial nerve deficit.     Deep Tendon Reflexes: Reflexes are normal and symmetric.  Psychiatric:        Mood and Affect: Mood normal.          Assessment & Plan:      This visit occurred during the SARS-CoV-2 public health emergency.  Safety protocols were in place, including screening questions prior to the visit, additional usage of staff PPE, and extensive cleaning of exam room while observing appropriate contact time as indicated for disinfecting solutions.

## 2021-06-15 NOTE — Assessment & Plan Note (Signed)
Recent hospital admission for stroke, also with stenosis to left carotid 70-80% and underwent left endarterectomy in  May 2022 per Dr. Donnetta Hutching

## 2021-06-15 NOTE — Assessment & Plan Note (Signed)
Recent A1C of 6.5 which is under good control. He would like a continuous glucometer, will send Rx for [employer].  Repeat A1C in 2 months.

## 2021-06-15 NOTE — Assessment & Plan Note (Signed)
Doesn't appear to be taking vitamin D, repeat level pending.

## 2021-06-15 NOTE — Patient Instructions (Signed)
Stop by the lab prior to leaving today. I will notify you of your results once received.   Reduce your atorvastatin cholesterol medication to 20 mg.   Schedule a nurse visit for 2 months to repeat your diabetes and cholesterol tests  It was a pleasure to see you today!

## 2021-06-15 NOTE — Assessment & Plan Note (Signed)
Hospitalization in April 2022, suspected to be secondary 70-80% stenosis of left carotid artery. Is now S/P left carotid endarterectomy per Early.  Continue Plavix 75 mg. Will reduce atorvastatin to 20 mg given myalgias on 40 mg. Repeat lipids in 2 months. Continue outpatient PT.  Hospital notes, labs, imaging reviewed.

## 2021-06-15 NOTE — Assessment & Plan Note (Signed)
Atorvastatin increased from 10 mg to 40 mg, although historically LDL is in the 50-60 range. Will decrease down to 20 mg given myalgias and repeat lipids in 2 months.

## 2021-06-15 NOTE — Assessment & Plan Note (Signed)
Declines Shingrix. Other vaccines UTD. PSA due and pending. Colonoscopy due in 2023, however may not need additional screening given age. Will discuss next year.  Discussed the importance of a healthy diet and regular exercise in order for weight loss, and to reduce the risk of further co-morbidity.  Exam today stable. Labs reviewed and pending.

## 2021-06-15 NOTE — Assessment & Plan Note (Signed)
Denies concerns today. °Continue to monitor. °

## 2021-06-15 NOTE — Assessment & Plan Note (Addendum)
Above goal in the office today, even higher with home readings. HCTZ 12.5 mg was discontinued during hospital stay, due to need for slightly higher BP given stroke.  Continue carvedilol 3.125 mg BID, losartan 50 mg.  Resume HCTZ as BP should be normotensive at this point. Stroke was two months ago. Rx sent to pharmacy.

## 2021-06-16 ENCOUNTER — Telehealth: Payer: Self-pay | Admitting: *Deleted

## 2021-06-16 NOTE — Telephone Encounter (Signed)
Notify patient that I sent refills for HCTZ to his pharmacy yesterday, he can resume once daily. Continue all other medications as prescribed.

## 2021-06-16 NOTE — Telephone Encounter (Signed)
Patient left a voicemail stating that he was seen yesterday. Patient stated that he has been waiting on a call from Allie Bossier NP about his medications.  Patient stated that he was waiting to hear back about his diuretic and losartan.

## 2021-06-16 NOTE — Telephone Encounter (Signed)
Notified pt of Rx sent into pharmacy and [place]ates instructions for meds. Pt stated understanding.

## 2021-06-19 ENCOUNTER — Other Ambulatory Visit: Payer: Self-pay

## 2021-06-19 ENCOUNTER — Encounter: Payer: Self-pay | Admitting: Vascular Surgery

## 2021-06-19 ENCOUNTER — Ambulatory Visit (INDEPENDENT_AMBULATORY_CARE_PROVIDER_SITE_OTHER): Payer: Medicare PPO | Admitting: Vascular Surgery

## 2021-06-19 ENCOUNTER — Telehealth: Payer: Self-pay

## 2021-06-19 VITALS — BP 122/75 | HR 75 | Temp 98.3°F | Resp 16 | Ht 73.0 in | Wt 219.0 lb

## 2021-06-19 DIAGNOSIS — Z9889 Other specified postprocedural states: Secondary | ICD-10-CM

## 2021-06-19 NOTE — Telephone Encounter (Signed)
Called pharmacy states insurance will not cover either the dexcom or freestyle. Patient can call and see what needs to be done but at this time they have no further information.

## 2021-06-19 NOTE — Telephone Encounter (Signed)
Pt's wife left a message on triage line stating he went to vascular surgeon this morning and his BP was 122/75. They seem to think his BP cuff at home may be too small. Wanted Anda Kraft to know in case it changes what may be done about his other BP readings.

## 2021-06-19 NOTE — Telephone Encounter (Signed)
Called patient about unrelated refill. He is concerned about b/l foot swelling. States that he read the side effects of the Carvedilol and is afraid that may be reason. States that he has some improvement when propping feet up. Denies any sob or congestion. He wanted to know if he can double the losartan and stop the carvedilol. He has only been taking the HCTZ for two days and has not noticed change in swelling. I have informed patient not to make any changes in medications until he has hear from our office. Anda Kraft will be back in tomorrow. His home readings have been high with 200/90 this morning. Patient has follow up today with Vascular surgery. I have had him recheck his blood pressure with reading of 200/100. He is rushing around trying to get to appointment. Advised that he makes sure that he let them know about blood pressures.

## 2021-06-19 NOTE — Progress Notes (Signed)
   [facility] [place]  Patient name: [name] [name] MRN: [id number] DOB: 1941-09-11 Sex: male  REASON FOR VISIT: Follow-up left carotid endarterectomy on 05/18/2021  HPI: [name] [name] is a 80 y.o. male here for follow-up of his left carotid endarterectomy.  He had a preoperative stroke which was not felt to be related to the carotid.  He did have critical left carotid disease.  He had had a prior right carotid endarterectomy t 2016.  He did well and was discharged home on postoperative day 1.  Current Outpatient Medications  Medication Sig Dispense Refill   acetaminophen (TYLENOL) 325 MG tablet Take 2 tablets (650 mg total) by mouth every 4 (four) hours as needed for mild pain (or temp > 37.5 C (99.5 F)).     atorvastatin (LIPITOR) 40 MG tablet Take 1 tablet (40 mg total) by mouth daily. For cholesterol (Patient taking differently: Take 20 mg by mouth daily. For cholesterol) 90 tablet 3   carvedilol (COREG) 3.125 MG tablet TAKE 1 TABLET (3.125 MG TOTAL) BY MOUTH 2 (TWO) TIMES DAILY WITH A MEAL. FOR BLOOD PRESSURE 180 tablet 0   cholecalciferol (VITAMIN D3) 25 MCG (1000 UNIT) tablet Take 1,000 Units by mouth daily.     clopidogrel (PLAVIX) 75 MG tablet Take 1 tablet (75 mg total) by mouth daily. 90 tablet 3   Continuous Blood Gluc Receiver (FREESTYLE LIBRE 14 DAY READER) DEVI Use with sensor to check blood sugar. 1 each 0   Continuous Blood Gluc Sensor (FREESTYLE LIBRE 14 DAY SENSOR) MISC Use as directed to check blood sugars. 6 each 1   hydrochlorothiazide (HYDRODIURIL) 12.5 MG tablet Take 1 tablet (12.5 mg total) by mouth daily. For blood pressure. 90 tablet 3   losartan (COZAAR) 25 MG tablet Take 2 tablets (50 mg total) by mouth daily. 180 tablet 1   nitroGLYCERIN (NITROSTAT) 0.4 MG SL tablet Place 1 tablet (0.4 mg total) under the tongue every 5 (five) minutes as needed for chest pain. 25 tablet 3   vitamin B-12 (CYANOCOBALAMIN)  1000 MCG tablet Take 1,000 mcg by mouth daily.     No current facility-administered medications for this visit.     PHYSICAL EXAM: Vitals:   06/19/21 1122  BP: 122/75  Pulse: 75  Resp: 16  Temp: 98.3 F (36.8 C)  TempSrc: Other (Comment)  SpO2: 97%  Weight: 219 lb (99.3 kg)  Height: 6\' 1"  (1.854 m)    GENERAL: The patient is a well-nourished male, in no acute distress. The vital signs are documented above. Left neck incision is well-healed.  No bruits bilaterally.  MEDICAL ISSUES: Stable overall.  Will continue usual activities.  He is safe to resume full physical therapy with no restrictions  I will see him again in 9 months with repeat carotid duplex   Rosetta Posner, MD FACS [facility] [phone]  Note: Portions of this report may have been transcribed using voice recognition software.  Every effort has been made to ensure accuracy; however, inadvertent computerized transcription errors may still be present.

## 2021-06-20 ENCOUNTER — Other Ambulatory Visit: Payer: Self-pay

## 2021-06-20 DIAGNOSIS — Z9889 Other specified postprocedural states: Secondary | ICD-10-CM

## 2021-06-20 NOTE — Telephone Encounter (Signed)
Called patient reviewed all information and repeated back to me. Will call if any questions.  ? ?

## 2021-06-20 NOTE — Telephone Encounter (Signed)
I do not recommend any medication changes at this time. Continue to take what is prescribed. He may speak with his cardiologist for concerns regarding carvedilol.

## 2021-06-21 ENCOUNTER — Other Ambulatory Visit: Payer: Self-pay

## 2021-06-21 ENCOUNTER — Ambulatory Visit: Payer: Medicare PPO

## 2021-06-21 DIAGNOSIS — I6381 Other cerebral infarction due to occlusion or stenosis of small artery: Secondary | ICD-10-CM

## 2021-06-21 DIAGNOSIS — R2689 Other abnormalities of gait and mobility: Secondary | ICD-10-CM

## 2021-06-21 DIAGNOSIS — M6281 Muscle weakness (generalized): Secondary | ICD-10-CM | POA: Diagnosis not present

## 2021-06-21 DIAGNOSIS — I639 Cerebral infarction, unspecified: Secondary | ICD-10-CM | POA: Diagnosis not present

## 2021-06-21 NOTE — Telephone Encounter (Signed)
He just called a few days ago stating that he thinks his BP cuff is too small. Did he find a larger cuff or is he still using the same one? It sounds like this may be a cuff problem and not BP problem.   His BP was very stable two days ago which is reassuring.  He can set up a nurse visit for [place] to check manually this week, since I cannot see him until next week.   BP Readings from Last 3 Encounters:  06/19/21 122/75  06/15/21 (!) 145/82  05/30/21 (!) 161/90

## 2021-06-21 NOTE — Telephone Encounter (Addendum)
Pt left v/m; pt had previously called about BP reading and BP has been erratic. Pt did as advised and called cardiology but pt could not get appt until 07/14/21. On 06/19/21 BP 200/100; then pt went to post op doctor and BP was 147/60 something. 06/21/21 was in physical rehab and over period of 1 hr BP was taken x 3.  BP was 110/ 50 something and reck 109/60 something. Pt does not know what to do. Can Allie Bossier NP get sooner appt with Dr Johnsie Cancel or can Allie Bossier NP see pt ASAP. I spoke with pt; pt does not have CP,SOB, H/A or dizziness. No vision changes. No covid symptoms and no known covid exposure. Pt stated he does not feel bad.Pt took BP when got home BP 170/80 something. Pt is not sure if BP cuff is working appropriately.and pt wonders if BP cuff is too small. Pt weighs 219 lbs and pt has arm measurement of about 14 inches. Both feet and ankles are swollen but is not red, warm to touch and does not hurt. Swelling of feet and ankles has been going on since mid April and swelling does not go down overnight.  Pt said had stroke in 03/2021 and while in hospital pt was put on carvedilol and pt is concerned that carvedilol may cause swelling of feet and ankles. Pt also had carotid surgery mid May 2022.Pt said he would feel better if he could see someone this week to find out why is BP is so erratic. I spoke with Gentry Fitz NP and she said to make sure pt is aware that HCTZ was sent to pharmacy for pt. Pt said yes he has been taking the HCTZ 12.5 mg once daily since 06/17/21.  Pt is still taking carvedilol and when pt called card he asked what to do about carvedilol and was not given any instruction but would discuss at 07/14/21 appt.  Pt will monitor BP once daily and if very high or low pt will reck x 1. ED precautions given and pt voiced understanding. [facility]. Tried to console pt not to worry and pt will get cb either today or tomorrow. Pt appreciative and voiced  understanding. Sending note to Gentry Fitz  NP.

## 2021-06-21 NOTE — Therapy (Signed)
[facility] [address] [place], [place], [address] Phone: [phone]   Fax:  [phone]  Physical Therapy Treatment  Patient Details  Name: [name] [name] MRN: [id number] Date of Birth: 09/02/1941 Referring Provider (PT): Dr. Alysia Penna   Encounter Date: 06/21/2021   PT End of Session - 06/21/21 0934     Visit Number 9    Number of Visits 12    Date for PT Re-Evaluation 07/05/21    Authorization Type [employer]; Recert 7/68/11 to 05/06/25    Authorization Time Period Plan for 4 visits and then assess for discharge    PT Start Time 0935    PT Stop Time 1015    PT Time Calculation (min) 40 min    Equipment Utilized During Treatment Gait belt    Activity Tolerance Patient tolerated treatment well    Behavior During Therapy [facility] for tasks assessed/performed             Past Medical History:  Diagnosis Date   Basal cell carcinoma 11/09/2019   nod & infil-behind right ear-cx3 &exc   Basal cell carcinoma 03/21/2020   Residual BCC with peripheral margin involved - ST recommends [facility]   Carotid artery occlusion    Coronary artery disease    s/p CABG 2005; sees Dr Johnsie Cancel yearly   Diabetes mellitus without complication ([place])    Gynecomastia    Hypercholesterolemia    Hypertension    Neurodermatitis    Overweight(278.02)    Personal history of colonic polyps 10/08/2006   tubular adenomas   Renal insufficiency    Stroke [facility])    TIA  April 2022   Transient ischemic attack 2010   "lasted ~ 5 seconds"   Vitamin D deficiency     Past Surgical History:  Procedure Laterality Date   CARDIAC CATHETERIZATION  02/2004   "tried to stent; couldn't"   CAROTID ENDARTERECTOMY Right 08/26/2015   COLONOSCOPY     CORONARY ANGIOPLASTY     CORONARY ARTERY BYPASS GRAFT  Feb. 2005   4 vessel   ENDARTERECTOMY Right 08/26/2015   Procedure: Right Carotid ENDARTERECTOMY with Patch Angioplasty ;  Surgeon: Rosetta Posner, MD;   Location: [facility] OR;  Service: Vascular;  Laterality: Right;   ENDARTERECTOMY Left 05/18/2021   Procedure: LEFT CAROTID ARTERY ENDARTERECTOMY  with patch angioplasty;  Surgeon: Rosetta Posner, MD;  Location: [facility] OR;  Service: Vascular;  Laterality: Left;   EYE SURGERY     bilateral cataract   KELOID EXCISION  04/2008   on chest scar; Dr. Dessie Coma   KELOID EXCISION     PILONIDAL CYST EXCISION  1989    There were no vitals filed for this visit.    Sci fit: level 10 for 10' BP after 2 min rest 110/59 Standing deadlift: 25lbs 10x from floor Carrying 25lbs in R hand 115', 25lbs in L hand 115' Step ladder: in, in, out, out: 3x with progressive speeds                          PT Short Term Goals - 05/17/21 0949       PT SHORT TERM GOAL #1   Title Pt will be able to walk 20 min for at least 5 days a week to improve walking endurance.    Baseline No walking    Time 4    Period Weeks    Status Revised    Target Date 06/14/21  PT Long Term Goals - 06/14/21 1014       PT LONG TERM GOAL #1   Title Pt will demo 5x sit to stand in 16 sec or less to improve functional strength    Baseline 21 sec (04/19/21); 10 sec (05/17/21)    Time 6    Period Weeks    Status Achieved      PT LONG TERM GOAL #2   Title Pt will be I and compliant with walking program to independently manage symptoms at home.    Baseline Walking progrm issued on 04/19/21; pt is compliant with 5 days a week and walking 16 min/day this week (05/17/21); pt is upto 20 min of walking every day now. (06/14/21)    Time 6    Period Weeks    Status Achieved      PT LONG TERM GOAL #3   Title Pt will demo 85 points on LE CVA on FOTO to improve overall function    Baseline 74 (4/20/2)    Time 7    Period Weeks    Status On-going      PT LONG TERM GOAL #4   Title Pt will demo 0.40m/s walking speed to improve fall risk and community ambulation    Baseline 0.34m/s (04/19/21) no AD; 0.74 m/s  (05/17/21); 0.83 m/s (06/06/21)    Time 7    Period Weeks    Status On-going                     Patient will benefit from skilled therapeutic intervention in order to improve the following deficits and impairments:     Visit Diagnosis: Muscle weakness (generalized)  Right thalamic infarction [facility])  Other abnormalities of gait and mobility     Problem List Patient Active Problem List   Diagnosis Date Noted   Carotid stenosis, asymptomatic, left 05/18/2021   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) 07/01/2012   Special screening for malignant neoplasm of prostate 05/08/2011   Vitamin D deficiency 11/20/2009   Transient cerebral ischemia 06/30/2009   CEREBROVASCULAR DISEASE 06/30/2009   COLONIC POLYPS 01/10/2008   Essential hypertension 01/10/2008   Coronary atherosclerosis 01/10/2008   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   GYNECOMASTIA, UNILATERAL 01/10/2008   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   SHOULDER PAIN 01/10/2008   Type 2 diabetes mellitus ([place]) 01/10/2008    [name], PT 06/21/2021, 9:35 AM  [facility] [address] [place], [place], [address] Phone: [phone]   Fax:  [phone]  Name: [name] [name] MRN: [id number] Date of Birth: May 25, 1941

## 2021-06-23 NOTE — Telephone Encounter (Signed)
Noted, I still recommend that he come by for a nurse visit and manual blood pressure check.  It looks like his cardiology appointment is not scheduled until August.  I am also still happy to see him anytime at his convenience.

## 2021-06-23 NOTE — Telephone Encounter (Signed)
Called patient states his symptoms are the same. Has not been able to find another cuff. Home readings yesterday was 167/80. Has appointment with cardiology on 6/17. He is also on cancellation list.

## 2021-06-26 NOTE — Telephone Encounter (Signed)
//  Patient called and set up to see Allie Bossier NP.  His appointment is for 07/04/2021 at 7:20 am. He is also having a hard time getting blood sugar machine to be able to check his sugar levels.

## 2021-06-27 NOTE — Telephone Encounter (Signed)
In regard to the glucose machine, what does this mean? Is he referring to the [facility]? Does he need a regular glucometer prescription faxed to his pharmacy?

## 2021-06-28 ENCOUNTER — Other Ambulatory Visit: Payer: Self-pay

## 2021-06-28 ENCOUNTER — Ambulatory Visit: Payer: Medicare PPO

## 2021-06-28 DIAGNOSIS — M6281 Muscle weakness (generalized): Secondary | ICD-10-CM | POA: Diagnosis not present

## 2021-06-28 DIAGNOSIS — R2689 Other abnormalities of gait and mobility: Secondary | ICD-10-CM

## 2021-06-28 DIAGNOSIS — I639 Cerebral infarction, unspecified: Secondary | ICD-10-CM | POA: Diagnosis not present

## 2021-06-28 DIAGNOSIS — I6381 Other cerebral infarction due to occlusion or stenosis of small artery: Secondary | ICD-10-CM

## 2021-06-28 NOTE — Therapy (Addendum)
[facility] [address] [place], [place], [address] Phone: [phone]   Fax:  [phone]  Physical Therapy Treatment  Patient Details  Name: [name] [name] MRN: [id number] Date of Birth: 11-Feb-1941 Referring Provider (PT): Dr. Alysia Penna   Encounter Date: 06/28/2021    PT End of Session - 06/28/21 0948     Visit Number 10    Number of Visits 12    Date for PT Re-Evaluation 07/05/21    Authorization Type [employer]; Recert 0/81/44 to 07/31/84    Authorization Time Period Plan for 4 visits and then assess for discharge    PT Start Time 0935    PT Stop Time 1015    PT Time Calculation (min) 40 min    Equipment Utilized During Treatment Gait belt    Activity Tolerance Patient tolerated treatment well    Behavior During Therapy [facility] for tasks assessed/performed             Past Medical History:  Diagnosis Date   Basal cell carcinoma 11/09/2019   nod & infil-behind right ear-cx3 &exc   Basal cell carcinoma 03/21/2020   Residual BCC with peripheral margin involved - ST recommends [facility]   Carotid artery occlusion    Coronary artery disease    s/p CABG 2005; sees Dr Johnsie Cancel yearly   Diabetes mellitus without complication ([place])    Gynecomastia    Hypercholesterolemia    Hypertension    Neurodermatitis    Overweight(278.02)    Personal history of colonic polyps 10/08/2006   tubular adenomas   Renal insufficiency    Stroke [facility])    TIA  April 2022   Transient ischemic attack 2010   "lasted ~ 5 seconds"   Vitamin D deficiency     Past Surgical History:  Procedure Laterality Date   CARDIAC CATHETERIZATION  02/2004   "tried to stent; couldn't"   CAROTID ENDARTERECTOMY Right 08/26/2015   COLONOSCOPY     CORONARY ANGIOPLASTY     CORONARY ARTERY BYPASS GRAFT  Feb. 2005   4 vessel   ENDARTERECTOMY Right 08/26/2015   Procedure: Right Carotid ENDARTERECTOMY with Patch Angioplasty ;  Surgeon: Rosetta Posner, MD;   Location: [facility] OR;  Service: Vascular;  Laterality: Right;   ENDARTERECTOMY Left 05/18/2021   Procedure: LEFT CAROTID ARTERY ENDARTERECTOMY  with patch angioplasty;  Surgeon: Rosetta Posner, MD;  Location: [facility] OR;  Service: Vascular;  Laterality: Left;   EYE SURGERY     bilateral cataract   KELOID EXCISION  04/2008   on chest scar; Dr. Dessie Coma   KELOID EXCISION     PILONIDAL CYST EXCISION  1989    There were no vitals filed for this visit.   Subjective Assessment - 06/28/21 0951     Subjective I am doing well.    Patient is accompained by: Family member    Currently in Pain? No/denies                    Sci fit: level 10 for 10' Standing deadlift: 30lbs 10x Sit to stand: 20lbs 10x Walking up and down the curb: 1x, cues to get closer to edge of curb before ascending or descending Walking through 4 feet section of pine needles: 2x Walking through graduald uphill/downhilll on grass: 1 x 80 feet, steep grass heel; 1x 30 feet, cues for looking down in front of him, lifting knees higher Standing to floor on all 4s and getting back up using mat table  for support surface: 1x SBA, cues for sequencing for safety Pt and wife educated to practice floor transfers at least 3x before next session.                       PT Short Term Goals - 05/17/21 0949       PT SHORT TERM GOAL #1   Title Pt will be able to walk 20 min for at least 5 days a week to improve walking endurance.    Baseline No walking    Time 4    Period Weeks    Status Revised    Target Date 06/14/21               PT Long Term Goals - 06/14/21 1014       PT LONG TERM GOAL #1   Title Pt will demo 5x sit to stand in 16 sec or less to improve functional strength    Baseline 21 sec (04/19/21); 10 sec (05/17/21)    Time 6    Period Weeks    Status Achieved      PT LONG TERM GOAL #2   Title Pt will be I and compliant with walking program to independently manage symptoms at home.     Baseline Walking progrm issued on 04/19/21; pt is compliant with 5 days a week and walking 16 min/day this week (05/17/21); pt is upto 20 min of walking every day now. (06/14/21)    Time 6    Period Weeks    Status Achieved      PT LONG TERM GOAL #3   Title Pt will demo 85 points on LE CVA on FOTO to improve overall function    Baseline 74 (4/20/2)    Time 7    Period Weeks    Status On-going      PT LONG TERM GOAL #4   Title Pt will demo 0.14m/s walking speed to improve fall risk and community ambulation    Baseline 0.29m/s (04/19/21) no AD; 0.74 m/s (05/17/21); 0.83 m/s (06/06/21)    Time 7    Period Weeks    Status On-going                   Plan - 06/28/21 0951     Clinical Impression Statement Today's skilled session was focused on gait training on uneven surfaces and practicing floor transfers. Patient will be seen one for time before discharge.    Personal Factors and Comorbidities Age;Time since onset of injury/illness/exacerbation;Past/Current Experience    Examination-Activity Limitations Lift;Stairs;Squat    Examination-Participation Restrictions Church;Community Activity;Driving;Shop;Yard Work;Cleaning    Stability/Clinical Decision Making Stable/Uncomplicated    Rehab Potential Good    PT Frequency 1x / week    PT Duration Other (comment)   7 weeks   PT Treatment/Interventions ADLs/Self Care Home Management;Gait training;Stair training;Functional mobility training;Therapeutic activities;Therapeutic exercise;Balance training;Neuromuscular re-education;Patient/family education;Orthotic Fit/Training;Manual techniques;Passive range of motion;Energy conservation;Joint Manipulations    PT Next Visit Plan Practice grass hills, floor transfers, discharge next session.    PT Home Exercise Plan Access Code [email or web address]    Consulted and Agree with Plan of Care Patient;Family member/caregiver             Patient will benefit from skilled therapeutic intervention in order to  improve the following deficits and impairments:  Abnormal gait, Decreased activity tolerance, Decreased balance, Decreased endurance, Decreased mobility, Difficulty walking, Decreased strength, Postural dysfunction  Visit Diagnosis: Muscle weakness (generalized)  Right thalamic  infarction [facility])  Other abnormalities of gait and mobility     Problem List Patient Active Problem List   Diagnosis Date Noted   Carotid stenosis, asymptomatic, left 05/18/2021   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   TIA (transient ischemic attack) 04/03/2021   Lower extremity edema 08/13/2019   Unsteady gait 06/09/2019   Encounter for well adult exam with abnormal findings 06/09/2019   Hyperlipidemia 01/30/2016   Carotid stenosis 08/26/2015   Anxiety, 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   Carotid artery disease ([place]) 07/01/2012   Special screening for malignant neoplasm of prostate 05/08/2011   Vitamin D deficiency 11/20/2009   Transient cerebral ischemia 06/30/2009   CEREBROVASCULAR DISEASE 06/30/2009   COLONIC POLYPS 01/10/2008   Essential hypertension 01/10/2008   Coronary atherosclerosis 01/10/2008   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   GYNECOMASTIA, UNILATERAL 01/10/2008   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   SHOULDER PAIN 01/10/2008   Type 2 diabetes mellitus ([place]) 01/10/2008    [name] 06/28/2021, 2:01 PM  [facility] [address] [place], [place], [address] Phone: [phone]   Fax:  [phone]  Name: [name] [name] MRN: [id number] Date of Birth: [birth year]

## 2021-06-28 NOTE — Telephone Encounter (Signed)
Patient states that free style is not covered under insurance pharmacy told him that they needed it to changed to Dexcom. I have not seen anything.

## 2021-06-29 ENCOUNTER — Other Ambulatory Visit: Payer: Self-pay

## 2021-06-29 MED ORDER — DEXCOM G6 SENSOR MISC: 1.0000 | 1 refills | Status: DC

## 2021-06-29 MED ORDER — DEXCOM G6 TRANSMITTER MISC
1.0000 | 1 refills | Status: DC
Start: 2021-06-29 — End: 2021-07-14

## 2021-06-29 MED ORDER — DEXCOM G6 RECEIVER DEVI: 1.0000 | Freq: Once | 0 refills | Status: AC

## 2021-06-29 NOTE — Telephone Encounter (Signed)
New script sent in for dexcom for patient.

## 2021-06-29 NOTE — Addendum Note (Signed)
Addended by: Pleas Koch on: 06/29/2021 06:38 AM   Modules accepted: Orders

## 2021-06-29 NOTE — Telephone Encounter (Signed)
Okay to change. Please send in.

## 2021-07-04 ENCOUNTER — Encounter: Payer: Self-pay | Admitting: Primary Care

## 2021-07-04 ENCOUNTER — Other Ambulatory Visit: Payer: Self-pay

## 2021-07-04 ENCOUNTER — Ambulatory Visit: Payer: Medicare PPO | Admitting: Primary Care

## 2021-07-04 DIAGNOSIS — I10 Essential (primary) hypertension: Secondary | ICD-10-CM | POA: Diagnosis not present

## 2021-07-04 NOTE — Progress Notes (Signed)
Patient ID: [name] [name], male   DOB: 1941/12/08, 80 y.o.   MRN: [id number]     Cardiology Office Note   Date:  07/04/2021   ID:  [name] [name], DOB [birth year], MRN [id number]  PCP:  Pleas Koch, NP  Cardiologist:  Dr. Johnsie Cancel    No chief complaint on file.     History of Present Illness: [name] [name] is a 80 y.o. male  known coronary disease with previous coronary bypass surgery in 2005. he has normal LV function.  Last myovue 09/09/18 no ischemia EF 64% low risk August 2016 Had right CEA with Dr Donnetta Hutching 2016   On statin with LDL at goal  No angina palpitations dyspnea or syncope  More broad based gait seen by Tomi Likens neuro Doubts Parkinson's getting B12 shots   Admitted 04/03/21 with stroke in thalamic region with left sided weakness left facial numbness and left leg weakness Echo normal EF 60% MRI small thalamic stroke LDL 57 A1c6 RX with DAT 3 weeks then plavix alone sent to inpatient rehab BP Rx with cozaar and coreg Somewhat confusing CTA report ? 70-80% proximal ICA but no laterality given Last duplex 10/17/20 had patent right CEA and 40-59% Left ICA stenosis Infarct felt to be from small vessel disease with associated lacunar infarcts but recommended f/u with VVS for left ICA   Seen by DR Early and had left CEA 05/18/21   He has more LE edema This is not CHF He has venous insufficiency and varicosities Coreg was d/c but has not helped a lot. Needs more diuretic. Statin dose increased in hospital during TIA wife would like him to be on lowest dose possible to reach target     Past Medical History:  Diagnosis Date   Basal cell carcinoma 11/09/2019   nod & infil-behind right ear-cx3 &exc   Basal cell carcinoma 03/21/2020   Residual BCC with peripheral margin involved - ST recommends [facility]   Carotid artery occlusion    Coronary artery disease    s/p CABG 2005; sees Dr Johnsie Cancel yearly   Diabetes mellitus without complication ([place])    Gynecomastia     Hypercholesterolemia    Hypertension    Neurodermatitis    Overweight(278.02)    Personal history of colonic polyps 10/08/2006   tubular adenomas   Renal insufficiency    Stroke [facility])    TIA  April 2022   Transient ischemic attack 2010   "lasted ~ 5 seconds"   Vitamin D deficiency     Past Surgical History:  Procedure Laterality Date   CARDIAC CATHETERIZATION  02/2004   "tried to stent; couldn't"   CAROTID ENDARTERECTOMY Right 08/26/2015   COLONOSCOPY     CORONARY ANGIOPLASTY     CORONARY ARTERY BYPASS GRAFT  Feb. 2005   4 vessel   ENDARTERECTOMY Right 08/26/2015   Procedure: Right Carotid ENDARTERECTOMY with Patch Angioplasty ;  Surgeon: Rosetta Posner, MD;  Location: [facility] OR;  Service: Vascular;  Laterality: Right;   ENDARTERECTOMY Left 05/18/2021   Procedure: LEFT CAROTID ARTERY ENDARTERECTOMY  with patch angioplasty;  Surgeon: Rosetta Posner, MD;  Location: [facility] OR;  Service: Vascular;  Laterality: Left;   EYE SURGERY     bilateral cataract   KELOID EXCISION  04/2008   on chest scar; Dr. Dessie Coma   KELOID EXCISION     PILONIDAL CYST EXCISION  1989     Current Outpatient Medications  Medication Sig Dispense Refill   atorvastatin (LIPITOR) 40 MG tablet  Take 1 tablet (40 mg total) by mouth daily. For cholesterol (Patient taking differently: Take 20 mg by mouth daily. For cholesterol) 90 tablet 3   carvedilol (COREG) 3.125 MG tablet TAKE 1 TABLET (3.125 MG TOTAL) BY MOUTH 2 (TWO) TIMES DAILY WITH A MEAL. FOR BLOOD PRESSURE 180 tablet 0   cholecalciferol (VITAMIN D3) 25 MCG (1000 UNIT) tablet Take 1,000 Units by mouth daily.     clopidogrel (PLAVIX) 75 MG tablet Take 1 tablet (75 mg total) by mouth daily. 90 tablet 3   Continuous Blood Gluc Sensor (DEXCOM G6 SENSOR) MISC 1 each by Does not apply route every 30 (thirty) days. 3 each 1   Continuous Blood Gluc Transmit (DEXCOM G6 TRANSMITTER) MISC 1 each by Does not apply route every 3 (three) months. 1 each 1   hydrochlorothiazide  (HYDRODIURIL) 12.5 MG tablet Take 1 tablet (12.5 mg total) by mouth daily. For blood pressure. 90 tablet 3   losartan (COZAAR) 25 MG tablet Take 2 tablets (50 mg total) by mouth daily. 180 tablet 1   nitroGLYCERIN (NITROSTAT) 0.4 MG SL tablet Place 1 tablet (0.4 mg total) under the tongue every 5 (five) minutes as needed for chest pain. 25 tablet 3   vitamin B-12 (CYANOCOBALAMIN) 1000 MCG tablet Take 1,000 mcg by mouth daily. Taking 5 days a week     No current facility-administered medications for this visit.    Allergies:   Niacin    Social History:  The patient  reports that he has never smoked. His smokeless tobacco use includes snuff. He reports that he does not drink alcohol and does not use drugs.   Family History:  The patient's family history includes Arthritis in his brother; Diabetes in his brother, mother, and sister; Fibromyalgia in his sister; Healthy in his daughter; Heart attack (age of onset: 75) in his mother; Heart disease in his brother, brother, and mother; Kidney disease in his mother; Lung cancer in his paternal uncle; Lung cancer (age of onset: 67) in his father.    ROS:  General:no colds or fevers, no weight changes Skin:no rashes or ulcers HEENT:no blurred vision, no congestion CV:see HPI PUL:see HPI GI:no diarrhea constipation or melena, no indigestion GU:no hematuria, no dysuria MS:no joint pain, no claudication Neuro:no syncope, no lightheadedness Endo:no diabetes, no thyroid disease  Wt Readings from Last 3 Encounters:  07/04/21 101.2 kg  06/19/21 99.3 kg  06/15/21 101.6 kg     PHYSICAL EXAM: VS:  There were no vitals taken for this visit. , BMI There is no height or weight on file to calculate BMI. Affect appropriate Healthy:  appears stated age 89: normal Neck supple with no adenopathy Post bilateral CEA  JVP normal no bruits no thyromegaly Lungs clear with no wheezing and good diaphragmatic motion Heart:  S1/S2 no murmur, no rub, gallop or  click PMI normal Abdomen: benighn, BS positve, no tenderness, no AAA no bruit.  No HSM or HJR Distal pulses intact with no bruits Plus one bilateral edema with varicosities  Neuro mild dysarthria  Skin warm and dry No muscular weakness    EKG:   10/16/19 SR rate 70 nonspecific ST changes   Recent Labs: 04/03/2021: TSH 2.483 04/05/2021: Magnesium 2.0 05/19/2021: Hemoglobin 14.2; Platelets 178 06/15/2021: ALT 18; BUN 17; Creatinine, Ser 1.17; Potassium 4.8; Sodium 140    Lipid Panel    Component Value Date/Time   CHOL 88 05/19/2021 0513   TRIG 69 05/19/2021 0513   HDL 28 (L) 05/19/2021 6834  CHOLHDL 3.1 05/19/2021 0513   VLDL 14 05/19/2021 0513   LDLCALC 46 05/19/2021 0513       Other studies Reviewed: Additional studies/ records that were reviewed today include: previous notes, nuc study.and carotid    ASSESSMENT AND PLAN:  1.  CAD s/p CABG 2005 Had no symptoms before CABG  Non ischemic myovue 09/09/18 continue medical Rx    2. Carotid:  Post right CEA Thalamic stroke April 2022 CTA suggested left ICA stenosis now post Left CEA by  Dr Donnetta Hutching 05/18/21   3.  HTN  Controlled  On beta blocker and ARB   4. Hyperlipidemia stable on statin. Last LDL 86 needs repeat by primary on 40 mg Lipitor    5. DM:  Discussed low carb diet.  Target hemoglobin A1c is 6.5 or less.  Continue current medications. He is getting Lab work in June with primary   6. Edema:  from LE venous disease Doubt stopping coreg will make a large improvement Change HCTZ to lasix will take 20 mg bid for a week then daily He has f/u labs with primary in [place]   Time spent reviewing hospital records, and all imaging modalities as well as consults direct patient interview, exam and composing note 45 minutes   F/U VVS F/U primary for edema/LDL and labs August  F/U Cardiology 6 months     [name]

## 2021-07-04 NOTE — Patient Instructions (Signed)
Continue taking your blood pressure medications as prescribed.   It was a pleasure to see you today!

## 2021-07-04 NOTE — Progress Notes (Signed)
Subjective:    Patient ID: [name] [name], male    DOB: 18-Feb-1941, 80 y.o.   MRN: [id number]  HPI  [name] [name] is a very pleasant 80 y.o. male with a history of hypertension TIA, carotid artery disease, type 2 diabetes, right thalamic infarction, anxiety who presents today to discuss blood pressure.  Recent hospital stay in May 2022 for right thalamic infarction and left carotid artery disease with left carotid artery endarterectomy. During his last visit in our office in June 2022 his BP was above goal, HCTZ 12.5 mg was temporarily discontinued due to need for higher BP given stroke, he was not told about this during his stay. Given elevated readings we resumed HCTZ 12.5 mg, continued carvedilol 3.125 mg BID, and losartan 50 mg.  Since his last visit he's contacted our office a few times concerned about BP readings. During his visit with his vascular surgeon his BP was 122/75 so we asked for him to consider a different home cuff and follow up with [place] today.   Today he's doing well. He has been active in physical therapy, doing well, has his last visit scheduled for tomorrow. He denies dizziness, weakness, visual changes. He is compliant to all BP medications as prescribed. Recent BP readings at home are running 180's/70's.   BP Readings from Last 3 Encounters:  07/04/21 140/72  06/19/21 122/75  06/15/21 (!) 145/82      Review of Systems  Eyes:  Negative for visual disturbance.  Cardiovascular:  Negative for chest pain.  Neurological:  Negative for dizziness, weakness and headaches.        Past Medical History:  Diagnosis Date   Basal cell carcinoma 11/09/2019   nod & infil-behind right ear-cx3 &exc   Basal cell carcinoma 03/21/2020   Residual BCC with peripheral margin involved - ST recommends MOHs   Carotid artery occlusion    Coronary artery disease    s/p CABG 2005; sees Dr Johnsie Cancel yearly   Diabetes mellitus without complication ([place])    Gynecomastia     Hypercholesterolemia    Hypertension    Neurodermatitis    Overweight(278.02)    Personal history of colonic polyps 10/08/2006   tubular adenomas   Renal insufficiency    Stroke [facility])    TIA  April 2022   Transient ischemic attack 2010   "lasted ~ 5 seconds"   Vitamin D deficiency     Social History   Socioeconomic History   Marital status: Married    Spouse name: [name]   Number of children: 1   Years of education: Not on file   Highest education level: Some college, no degree  Occupational History   Occupation: retired  Tobacco Use   Smoking status: Never   Smokeless tobacco: Current    Types: Snuff   Tobacco comments:    uses 2 dip/week  Vaping Use   Vaping Use: Never used  Substance and Sexual Activity   Alcohol use: No    Alcohol/week: 0.0 standard drinks   Drug use: No   Sexual activity: Yes  Other Topics Concern   Not on file  Social History Narrative   Retired.   Once worked for the [employer].   Married.   Enjoys reading, spending time with family.    Left handed   Drinks caffeine   One story home   Social Determinants of Health   Financial Resource Strain: Low Risk    Difficulty of Paying Living Expenses: Not hard at all  Food Insecurity: No Food Insecurity   Worried About Charity fundraiser in the Last Year: Never true   Ran Out of Food in the Last Year: Never true  Transportation Needs: No Transportation Needs   Lack of Transportation (Medical): No   Lack of Transportation (Non-Medical): No  Physical Activity: Insufficiently Active   Days of Exercise per Week: 1 day   Minutes of Exercise per Session: 20 min  Stress: No Stress Concern Present   Feeling of Stress : Not at all  Social Connections: Moderately Integrated   Frequency of Communication with Friends and Family: More than three times a week   Frequency of Social Gatherings with Friends and Family: More than three times a week   Attends Religious Services: More than 4 times per year   Active  Member of Clubs or Organizations: No   Attends Archivist Meetings: Never   Marital Status: Married  Human resources officer Violence: Not on file    Past Surgical History:  Procedure Laterality Date   CARDIAC CATHETERIZATION  02/2004   "tried to stent; couldn't"   CAROTID ENDARTERECTOMY Right 08/26/2015   COLONOSCOPY     CORONARY ANGIOPLASTY     CORONARY ARTERY BYPASS GRAFT  Feb. 2005   4 vessel   ENDARTERECTOMY Right 08/26/2015   Procedure: Right Carotid ENDARTERECTOMY with Patch Angioplasty ;  Surgeon: Rosetta Posner, MD;  Location: [facility] OR;  Service: Vascular;  Laterality: Right;   ENDARTERECTOMY Left 05/18/2021   Procedure: LEFT CAROTID ARTERY ENDARTERECTOMY  with patch angioplasty;  Surgeon: Rosetta Posner, MD;  Location: [facility] OR;  Service: Vascular;  Laterality: Left;   EYE SURGERY     bilateral cataract   KELOID EXCISION  04/2008   on chest scar; Dr. Dessie Coma   KELOID EXCISION     PILONIDAL CYST EXCISION  1989    Family History  Problem Relation Age of Onset   Heart disease Mother        Before age 44   Diabetes Mother    Kidney disease Mother    Heart attack Mother 22   Lung cancer Father 36   Diabetes Brother    Heart disease Brother    Heart disease Brother    Arthritis Brother    Diabetes Sister    Fibromyalgia Sister    Lung cancer Paternal Uncle        questionable as to if it was lung ca   Healthy Daughter    Colon cancer Neg Hx    Stroke Neg Hx     Allergies  Allergen Reactions   Niacin Other (See Comments)    intol to NIACIN w/ headaches     Current Outpatient Medications on File Prior to Visit  Medication Sig Dispense Refill   atorvastatin (LIPITOR) 40 MG tablet Take 1 tablet (40 mg total) by mouth daily. For cholesterol (Patient taking differently: Take 20 mg by mouth daily. For cholesterol) 90 tablet 3   carvedilol (COREG) 3.125 MG tablet TAKE 1 TABLET (3.125 MG TOTAL) BY MOUTH 2 (TWO) TIMES DAILY WITH A MEAL. FOR BLOOD PRESSURE 180 tablet 0    cholecalciferol (VITAMIN D3) 25 MCG (1000 UNIT) tablet Take 1,000 Units by mouth daily.     clopidogrel (PLAVIX) 75 MG tablet Take 1 tablet (75 mg total) by mouth daily. 90 tablet 3   Continuous Blood Gluc Sensor (DEXCOM G6 SENSOR) MISC 1 each by Does not apply route every 30 (thirty) days. 3 each 1   Continuous  Blood Gluc Transmit (DEXCOM G6 TRANSMITTER) MISC 1 each by Does not apply route every 3 (three) months. 1 each 1   hydrochlorothiazide (HYDRODIURIL) 12.5 MG tablet Take 1 tablet (12.5 mg total) by mouth daily. For blood pressure. 90 tablet 3   losartan (COZAAR) 25 MG tablet Take 2 tablets (50 mg total) by mouth daily. 180 tablet 1   nitroGLYCERIN (NITROSTAT) 0.4 MG SL tablet Place 1 tablet (0.4 mg total) under the tongue every 5 (five) minutes as needed for chest pain. 25 tablet 3   vitamin B-12 (CYANOCOBALAMIN) 1000 MCG tablet Take 1,000 mcg by mouth daily.     No current facility-administered medications on file prior to visit.    BP 140/72   Pulse 76   Temp (!) 97.5 F (36.4 C) (Temporal)   Ht 6\' 1"  (1.854 m)   Wt 223 lb (101.2 kg)   SpO2 99%   BMI 29.42 kg/m  Objective:   Physical Exam Cardiovascular:     Rate and Rhythm: Normal rate and regular rhythm.  Pulmonary:     Effort: Pulmonary effort is normal.     Breath sounds: Normal breath sounds. No wheezing or rales.  Musculoskeletal:     Cervical back: Neck supple.  Skin:    General: Skin is warm and dry.  Neurological:     Mental Status: He is alert and oriented to person, place, and time.          Assessment & Plan:      This visit occurred during the SARS-CoV-2 public health emergency.  Safety protocols were in place, including screening questions prior to the visit, additional usage of staff PPE, and extensive cleaning of exam room while observing appropriate contact time as indicated for disinfecting solutions.

## 2021-07-04 NOTE — Assessment & Plan Note (Signed)
Overall stable given age and frailty. Continue HCTZ 12.5 mg, carvedilol 3.125 mg BID, and losartan 50 mg.   He has an appointment scheduled with his cardiologist for next week.

## 2021-07-05 ENCOUNTER — Ambulatory Visit: Payer: Medicare PPO | Attending: Physical Medicine & Rehabilitation

## 2021-07-05 DIAGNOSIS — M6281 Muscle weakness (generalized): Secondary | ICD-10-CM | POA: Diagnosis not present

## 2021-07-05 DIAGNOSIS — I6381 Other cerebral infarction due to occlusion or stenosis of small artery: Secondary | ICD-10-CM

## 2021-07-05 DIAGNOSIS — I639 Cerebral infarction, unspecified: Secondary | ICD-10-CM | POA: Diagnosis not present

## 2021-07-05 DIAGNOSIS — R2689 Other abnormalities of gait and mobility: Secondary | ICD-10-CM | POA: Diagnosis not present

## 2021-07-05 NOTE — Therapy (Signed)
[facility] [address] [place], [place], [address] Phone: [phone]   Fax:  [phone]  Physical Therapy Discharge Summary Patient Details  Name: [name] [name] MRN: [id number] Date of Birth: [birth year] Referring Provider (PT): Dr. Alysia Penna   Encounter Date: 07/05/2021   PT End of Session - 07/05/21 0936     Visit Number 11    Number of Visits 12    Date for PT Re-Evaluation 07/05/21    Authorization Type [employer]; Recert 5/91/63 to 08/03/65    Authorization Time Period Plan for 4 visits and then assess for discharge    PT Start Time 0930    PT Stop Time 1015    PT Time Calculation (min) 45 min    Equipment Utilized During Treatment Gait belt    Activity Tolerance Patient tolerated treatment well    Behavior During Therapy [facility] for tasks assessed/performed             Past Medical History:  Diagnosis Date   Basal cell carcinoma 11/09/2019   nod & infil-behind right ear-cx3 &exc   Basal cell carcinoma 03/21/2020   Residual BCC with peripheral margin involved - ST recommends [facility]   Carotid artery occlusion    Coronary artery disease    s/p CABG 2005; sees Dr Johnsie Cancel yearly   Diabetes mellitus without complication ([place])    Gynecomastia    Hypercholesterolemia    Hypertension    Neurodermatitis    Overweight(278.02)    Personal history of colonic polyps 10/08/2006   tubular adenomas   Renal insufficiency    Stroke [facility])    TIA  April 2022   Transient ischemic attack 2010   "lasted ~ 5 seconds"   Vitamin D deficiency     Past Surgical History:  Procedure Laterality Date   CARDIAC CATHETERIZATION  02/2004   "tried to stent; couldn't"   CAROTID ENDARTERECTOMY Right 08/26/2015   COLONOSCOPY     CORONARY ANGIOPLASTY     CORONARY ARTERY BYPASS GRAFT  Feb. 2005   4 vessel   ENDARTERECTOMY Right 08/26/2015   Procedure: Right Carotid ENDARTERECTOMY with Patch Angioplasty ;  Surgeon: Rosetta Posner,  MD;  Location: [facility] OR;  Service: Vascular;  Laterality: Right;   ENDARTERECTOMY Left 05/18/2021   Procedure: LEFT CAROTID ARTERY ENDARTERECTOMY  with patch angioplasty;  Surgeon: Rosetta Posner, MD;  Location: [facility] OR;  Service: Vascular;  Laterality: Left;   EYE SURGERY     bilateral cataract   KELOID EXCISION  04/2008   on chest scar; Dr. Dessie Coma   KELOID EXCISION     PILONIDAL CYST EXCISION  1989    There were no vitals filed for this visit.   Subjective Assessment - 07/05/21 0938     Subjective Went to PCP and they were pleased. He had to get on the floor and was able to get back up.    Patient is accompained by: Family member                [facility] PT Assessment - 07/05/21 0944       Standardized Balance Assessment   Five times sit to stand comments  11.63    10 Meter Walk 0.79ms no AD      Functional Gait  Assessment   Gait Level Surface Walks 20 ft in less than 7 sec but greater than 5.5 sec, uses assistive device, slower speed, mild gait deviations, or deviates 6-10 in outside of the 12 in  walkway width.    Change in Gait Speed Able to smoothly change walking speed without loss of balance or gait deviation. Deviate no more than 6 in outside of the 12 in walkway width.    Gait with Horizontal Head Turns Performs head turns smoothly with no change in gait. Deviates no more than 6 in outside 12 in walkway width    Gait with Vertical Head Turns Performs head turns with no change in gait. Deviates no more than 6 in outside 12 in walkway width.    Gait and Pivot Turn Pivot turns safely within 3 sec and stops quickly with no loss of balance.    Step Over Obstacle Is able to step over 2 stacked shoe boxes taped together (9 in total height) without changing gait speed. No evidence of imbalance.    Gait with Narrow Base of Support Ambulates 7-9 steps.    Gait with Eyes Closed Walks 20 ft, no assistive devices, good speed, no evidence of imbalance, normal gait pattern, deviates no  more than 6 in outside 12 in walkway width. Ambulates 20 ft in less than 7 sec.    Ambulating Backwards Walks 20 ft, no assistive devices, good speed, no evidence for imbalance, normal gait    Steps Alternating feet, must use rail.    Total Score 27    FGA comment: 27/30               Reviewed following for final HEP Walking program: continue until you can walk 30 min per day Sit to stand: progress ny holding 5-10 lbs in your hand Tandem walking in hallway with wall for support as needed.                      PT Short Term Goals - 07/05/21 0940       PT SHORT TERM GOAL #1   Title Pt will be able to walk 20 min for at least 5 days a week to improve walking endurance.    Baseline No walking; 28 min of walking per day (07/05/21)    Time 4    Period Weeks    Status Achieved    Target Date 06/14/21               PT Long Term Goals - 07/05/21 0941       PT LONG TERM GOAL #1   Title Pt will demo 5x sit to stand in 16 sec or less to improve functional strength    Baseline 21 sec (04/19/21); 10 sec (05/17/21)    Time 6    Period Weeks    Status Achieved      PT LONG TERM GOAL #2   Title Pt will be I and compliant with walking program to independently manage symptoms at home.    Baseline Walking progrm issued on 04/19/21; pt is compliant with 5 days a week and walking 16 min/day this week (05/17/21); pt is upto 20 min of walking every day now. (06/14/21)    Time 6    Period Weeks    Status Achieved      PT LONG TERM GOAL #3   Title Pt will demo 85 points on LE CVA on FOTO to improve overall function    Baseline 74 (4/20/2); 68 (07/05/21)    Time 7    Period Weeks    Status Not Met      PT LONG TERM GOAL #4   Title Pt will  demo 0.35ms walking speed to improve fall risk and community ambulation    Baseline 0.765m (04/19/21) no AD; 0.74 m/s (05/17/21); 0.83 m/s (06/06/21); 0.87 m/s (07/05/21)    Time 7    Period Weeks    Status Achieved                    Plan - 07/05/21 1001     Clinical Impression Statement Patient has been seen for total of 11 sessions from 04/19/21 to 07/05/21 for 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 Patient has progressed well and met all of his short term and long term goals. patient will be discharged from skilled PT with independent HEP.    Personal Factors and Comorbidities Age;Time since onset of injury/illness/exacerbation;Past/Current Experience    Examination-Activity Limitations Lift;Stairs;Squat    Examination-Participation Restrictions Church;Community Activity;Driving;Shop;Yard Work;Cleaning    Stability/Clinical Decision Making Stable/Uncomplicated    Rehab Potential Good    PT Frequency 1x / week    PT Duration Other (comment)   7 weeks   PT Treatment/Interventions Patient/family education    PT Next Visit Plan Pt discharged    PT Home Exercise Plan Access Code [email or web address]    Consulted and Agree with Plan of Care Patient;Family member/caregiver             Patient will benefit from skilled therapeutic intervention in order to improve the following deficits and impairments:  Abnormal gait, Decreased activity tolerance, Decreased balance, Decreased endurance, Decreased mobility, Difficulty walking, Decreased strength, Postural dysfunction  Visit Diagnosis: Muscle weakness (generalized)  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[facility] Other abnormalities of gait and mobility     Problem List Patient Active Problem List   Diagnosis Date Noted   Carotid stenosis, asymptomatic, left 05/18/2021   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[facility]04/06/2021   TIA (transient ischemic attack) 04/03/2021   Lower extremity edema 08/13/2019   Unsteady gait 06/09/2019   Encounter for well adult exam with abnormal findings 06/09/2019   Trigger ring finger of right hand 06/02/2018   Hyperlipidemia 01/30/2016   Carotid stenosis 08/26/2015   Anxiety, 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   Carotid artery disease ([facility]07/02/2012   Special  screening for malignant neoplasm of prostate 05/08/2011   Vitamin D deficiency 11/20/2009   Transient cerebral ischemia 06/30/2009   CEREBROVASCULAR DISEASE 06/30/2009   COLONIC POLYPS 01/10/2008   Essential hypertension 01/10/2008   Coronary atherosclerosis 01/10/2008   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

## 2021-07-06 ENCOUNTER — Telehealth: Payer: Self-pay | Admitting: Cardiovascular Disease

## 2021-07-06 NOTE — Telephone Encounter (Signed)
Called patient back. Patient is complaining of bilateral foot and ankle swelling that has been going on for 2 months. Patient stated he was started on Coreg and he states that ever since he has been having the swelling. Patient denies any other symptoms. Patient stated his PCP put him on HCTZ 12.5 mg to help. Patient has not seen a difference. Patient has office visit on 07/14/21 with Dr. Johnsie Cancel. Will forward to Dr. Johnsie Cancel for further advisement.

## 2021-07-06 NOTE — Telephone Encounter (Signed)
Patient's wife states the patient received a call regarding his appointment scheduled for 07/14/21 with Dr. Johnsie Cancel. She states someone informed the patient that his appointment will be changed to a virtual. She states the patient is not interested in virtual. I don't see any documentation. Did anyone contact this patient?

## 2021-07-06 NOTE — Telephone Encounter (Signed)
Per Dr. Johnsie Cancel, he can hold for 2 weeks and see if swelling improves.   Called patient and left message for patient to call back.

## 2021-07-07 NOTE — Telephone Encounter (Signed)
Called patient back about Dr. Kyla Balzarine recommendations. Patient verbalized understanding and will hold Coreg until he sees Dr. Johnsie Cancel next week.

## 2021-07-07 NOTE — Telephone Encounter (Signed)
[name] [name] is returning Pamela's call. Please advise.

## 2021-07-14 ENCOUNTER — Ambulatory Visit: Payer: Medicare PPO | Admitting: Cardiovascular Disease

## 2021-07-14 ENCOUNTER — Encounter: Payer: Self-pay | Admitting: Cardiovascular Disease

## 2021-07-14 ENCOUNTER — Other Ambulatory Visit: Payer: Self-pay

## 2021-07-14 VITALS — BP 138/62 | HR 80 | Ht 73.0 in | Wt 222.8 lb

## 2021-07-14 DIAGNOSIS — I10 Essential (primary) hypertension: Secondary | ICD-10-CM | POA: Diagnosis not present

## 2021-07-14 DIAGNOSIS — R609 Edema, unspecified: Secondary | ICD-10-CM | POA: Diagnosis not present

## 2021-07-14 DIAGNOSIS — Z951 Presence of aortocoronary bypass graft: Secondary | ICD-10-CM | POA: Diagnosis not present

## 2021-07-14 DIAGNOSIS — G459 Transient cerebral ischemic attack, unspecified: Secondary | ICD-10-CM

## 2021-07-14 MED ORDER — FUROSEMIDE 20 MG PO TABS: 20.0000 mg | ORAL_TABLET | Freq: Every day | ORAL | 3 refills | Status: DC

## 2021-07-14 MED ORDER — NITROGLYCERIN 0.4 MG SL SUBL: 0.4000 mg | SUBLINGUAL_TABLET | SUBLINGUAL | 3 refills | Status: DC | PRN

## 2021-07-14 NOTE — Patient Instructions (Addendum)
Medication Instructions:  Your physician recommends that you continue on your current medications as directed. Please refer to the Current Medication list given to you today. 1-STOP hydrochlorothiazide  2-START Lasix 20 mg by mouth daily. *If you need a refill on your cardiac medications before your next appointment, please call your pharmacy*  Lab Work: Your physician recommends that you have these labs done at your primary care doctor's office in August. Lipid and Liver Panel and BMET.  If you have labs (blood work) drawn today and your tests are completely normal, you will receive your results only by: [place] (if you have MyChart) OR A paper copy in the mail If you have any lab test that is abnormal or we need to change your treatment, we will call you to review the results.  Testing/Procedures: None ordered today.  Follow-Up: At [facility], you and your health needs are our priority.  As part of our continuing mission to provide you with exceptional heart care, we have created designated Provider Care Teams.  These Care Teams include your primary Cardiologist (physician) and Advanced Practice Providers (APPs -  Physician Assistants and Nurse Practitioners) who all work together to provide you with the care you need, when you need it.  We recommend signing up for the patient portal called "MyChart".  Sign up information is provided on this After Visit Summary.  MyChart is used to connect with patients for Virtual Visits (Telemedicine).  Patients are able to view lab/test results, encounter notes, upcoming appointments, etc.  Non-urgent messages can be sent to your provider as well.   To learn more about what you can do with MyChart, go to [email or web address].    Your next appointment:   6 month(s)  The format for your next appointment:   In Person  Provider:   You may see [name], MD or one of the following Advanced Practice Providers on your designated Care  Team:   Cecilie Kicks, NP

## 2021-08-01 ENCOUNTER — Telehealth: Payer: Self-pay | Admitting: *Deleted

## 2021-08-01 ENCOUNTER — Telehealth: Payer: Self-pay | Admitting: Cardiovascular Disease

## 2021-08-01 DIAGNOSIS — R600 Localized edema: Secondary | ICD-10-CM

## 2021-08-01 NOTE — Telephone Encounter (Signed)
Labs were ordered by his cardiologist and are pending.

## 2021-08-01 NOTE — Telephone Encounter (Signed)
This is not CHF EF is normal he has bad veins in his legs Continue diuretic check BMET/BNP  LE venous duplex bilateral for reflux and refer to [facility] for LE venous reflux     ----- Message -----  From: Loren Racer, RN  Sent: 08/01/2021  10:37 AM EDT  To: Josue Hector, MD, Michaelyn Barter, RN, *    Spoke with pt and reviewed recommendations per Dr. Johnsie Cancel.  Pt agreeable to plan.  He see Dr[facility] Sherren Mocha Early at [facility] so I have placed a referral back to him to eval edema.  Labs scheduled for 8/4.  Pt aware vascular scheduler will contact him to get doppler scheduled.

## 2021-08-01 NOTE — Telephone Encounter (Signed)
Spoke with pt and he states that his swelling has not improved since changing his HCTZ to Furosemide on 7/15.  States he doesn't feel like he urinates any more than usual. Was instructed to take Furosemide '20mg'$  BID for the first week, then go to QD but only did BID for 3 days.  Denies increased salt in diet.  States the only thing that helps the swelling is elevation.  States since changing medication he feels weak and has no energy.  Called PCP and they told him to contact our office.  Advised I will send to Dr. Johnsie Cancel for review and advisement.

## 2021-08-01 NOTE — Telephone Encounter (Signed)
Pt c/o medication issue: 1. Name of Medication: lasix  2. How are you currently taking this medication (dosage and times per day)? Once a day 3. Are you having a reaction (difficulty breathing--STAT)?  No  4. What is your medication issue? Patient states he doesn't  think the medication is not working.

## 2021-08-01 NOTE — Telephone Encounter (Signed)
Patient called stating that his cardiologist changed his medication the last time he was in to see him. Patient stated that he is having problems with the Lasix that his cardiologist put him on. Patient was advised that he needs to reach to his cardiologist and let him know the problems that he is having. Patient stated that he has lab work scheduled here 08/15/21 and his cardiologist wants to make sure that his Bmet, liver and lipids are checked. Patient stated that this blood work needs to be checked because he is on Lasix.  Patient stated that he will call his cardiologist as soon as he gets off of this phone call.

## 2021-08-02 ENCOUNTER — Ambulatory Visit (HOSPITAL_COMMUNITY)
Admission: RE | Admit: 2021-08-02 | Discharge: 2021-08-02 | Disposition: A | Discharge status: Home / Self Care | Payer: Medicare PPO | Source: Ambulatory Visit | Attending: Cardiovascular Disease | Admitting: Cardiovascular Disease

## 2021-08-02 ENCOUNTER — Other Ambulatory Visit: Payer: Self-pay

## 2021-08-02 DIAGNOSIS — R600 Localized edema: Secondary | ICD-10-CM

## 2021-08-03 ENCOUNTER — Other Ambulatory Visit: Payer: Medicare PPO

## 2021-08-03 DIAGNOSIS — R600 Localized edema: Secondary | ICD-10-CM | POA: Diagnosis not present

## 2021-08-04 ENCOUNTER — Ambulatory Visit: Payer: Medicare PPO | Admitting: Cardiovascular Disease

## 2021-08-04 LAB — BASIC METABOLIC PANEL
BUN/Creatinine Ratio: 18 (ref 10–24)
BUN: 19 mg/dL (ref 8–27)
CO2: 25 mmol/L (ref 20–29)
Calcium: 9.1 mg/dL (ref 8.6–10.2)
Chloride: 103 mmol/L (ref 96–106)
Creatinine, Ser: 1.06 mg/dL (ref 0.76–1.27)
Glucose: 132 mg/dL — ABNORMAL HIGH (ref 65–99)
Potassium: 4.2 mmol/L (ref 3.5–5.2)
Sodium: 143 mmol/L (ref 134–144)
eGFR: 72 mL/min/{1.73_m2} (ref >59)

## 2021-08-04 LAB — PRO B NATRIURETIC PEPTIDE: NT-Pro BNP: 300 pg/mL (ref 0–486)

## 2021-08-07 ENCOUNTER — Telehealth: Payer: Self-pay

## 2021-08-07 MED ORDER — HYDROCHLOROTHIAZIDE 12.5 MG PO CAPS: 12.5000 mg | ORAL_CAPSULE | Freq: Every day | ORAL | 3 refills | Status: DC

## 2021-08-07 NOTE — Telephone Encounter (Signed)
Called patient with Dr. Kyla Balzarine response. Will update his medication list with changes.

## 2021-08-07 NOTE — Telephone Encounter (Signed)
-----   Message from Josue Hector, MD sent at 08/04/2021  5:08 PM EDT ----- That's fine  ----- Message ----- From: Michaelyn Barter, RN Sent: 08/04/2021   4:21 PM EDT To: Josue Hector, MD  Called patient with results. Patient wants to know if he can switch from Lasix back to HCTZ 12.5 mg since Dr. Johnsie Cancel does not recommend him stopping the diuretic due to past swelling in his feet and ankles. Will forward to Dr. Johnsie Cancel to see if patient can switch back.

## 2021-08-08 ENCOUNTER — Encounter: Payer: Self-pay | Admitting: Physician Assistant

## 2021-08-08 ENCOUNTER — Ambulatory Visit: Payer: Medicare PPO | Admitting: Physician Assistant

## 2021-08-08 ENCOUNTER — Other Ambulatory Visit: Payer: Self-pay

## 2021-08-08 VITALS — BP 134/72 | HR 85 | Temp 97.3°F | Ht 73.0 in | Wt 224.4 lb

## 2021-08-08 DIAGNOSIS — R600 Localized edema: Secondary | ICD-10-CM

## 2021-08-08 DIAGNOSIS — I872 Venous insufficiency (chronic) (peripheral): Secondary | ICD-10-CM | POA: Diagnosis not present

## 2021-08-08 DIAGNOSIS — I8393 Asymptomatic varicose veins of bilateral lower extremities: Secondary | ICD-10-CM | POA: Diagnosis not present

## 2021-08-08 NOTE — Progress Notes (Signed)
Office Note     CC:  follow up Requesting Provider:  Pleas Koch, NP  HPI: [name] [name] is a 80 y.o. (12-14-1941) male who presents for evaluation of bilateral lower extremity varicose veins.  He is known to [facility] with history of right carotid endarterectomy in 2016 as well as left carotid endarterectomy in June of this year by Dr. Donnetta Hutching.  He has noticed more varicose veins and bilateral lower extremity edema recently.  He states veins and edema are not bothersome to him.  He manages edema with elevation of his legs in his recliner.  He denies any history of DVT, venous ulcerations, or trauma.  Patient believes he had his right greater saphenous vein harvested for coronary artery bypass grafting.  He is ambulatory with a cane.  He denies tobacco use.   Past Medical History:  Diagnosis Date   Basal cell carcinoma 11/09/2019   nod & infil-behind right ear-cx3 &exc   Basal cell carcinoma 03/21/2020   Residual BCC with peripheral margin involved - ST recommends MOHs   Carotid artery occlusion    Coronary artery disease    s/p CABG 2005; sees Dr Johnsie Cancel yearly   Diabetes mellitus without complication ([place])    Gynecomastia    Hypercholesterolemia    Hypertension    Neurodermatitis    Overweight(278.02)    Personal history of colonic polyps 10/08/2006   tubular adenomas   Renal insufficiency    Stroke [facility])    TIA  April 2022   Transient ischemic attack 2010   "lasted ~ 5 seconds"   Vitamin D deficiency     Past Surgical History:  Procedure Laterality Date   CARDIAC CATHETERIZATION  02/2004   "tried to stent; couldn't"   CAROTID ENDARTERECTOMY Right 08/26/2015   COLONOSCOPY     CORONARY ANGIOPLASTY     CORONARY ARTERY BYPASS GRAFT  Feb. 2005   4 vessel   ENDARTERECTOMY Right 08/26/2015   Procedure: Right Carotid ENDARTERECTOMY with Patch Angioplasty ;  Surgeon: Rosetta Posner, MD;  Location: [facility] OR;  Service: Vascular;  Laterality: Right;   ENDARTERECTOMY Left 05/18/2021    Procedure: LEFT CAROTID ARTERY ENDARTERECTOMY  with patch angioplasty;  Surgeon: Rosetta Posner, MD;  Location: [facility] OR;  Service: Vascular;  Laterality: Left;   EYE SURGERY     bilateral cataract   KELOID EXCISION  04/2008   on chest scar; Dr. Dessie Coma   KELOID EXCISION     PILONIDAL CYST EXCISION  1989    Social History   Socioeconomic History   Marital status: Married    Spouse name: [name]   Number of children: 1   Years of education: Not on file   Highest education level: Some college, no degree  Occupational History   Occupation: retired  Tobacco Use   Smoking status: Never   Smokeless tobacco: Current    Types: Snuff   Tobacco comments:    uses 2 dip/week  Vaping Use   Vaping Use: Never used  Substance and Sexual Activity   Alcohol use: No    Alcohol/week: 0.0 standard drinks   Drug use: No   Sexual activity: Yes  Other Topics Concern   Not on file  Social History Narrative   Retired.   Once worked for the [employer].   Married.   Enjoys reading, spending time with family.    Left handed   Drinks caffeine   One story home   Social Determinants of Radio broadcast assistant  Strain: Low Risk    Difficulty of Paying Living Expenses: Not hard at all  Food Insecurity: No Food Insecurity   Worried About Charity fundraiser in the Last Year: Never true   Ran Out of Food in the Last Year: Never true  Transportation Needs: No Transportation Needs   Lack of Transportation (Medical): No   Lack of Transportation (Non-Medical): No  Physical Activity: Insufficiently Active   Days of Exercise per Week: 1 day   Minutes of Exercise per Session: 20 min  Stress: No Stress Concern Present   Feeling of Stress : Not at all  Social Connections: Moderately Integrated   Frequency of Communication with Friends and Family: More than three times a week   Frequency of Social Gatherings with Friends and Family: More than three times a week   Attends Religious Services: More than 4 times  per year   Active Member of [employer] or Organizations: No   Attends Archivist Meetings: Never   Marital Status: Married  Human resources officer Violence: Not on file    Family History  Problem Relation Age of Onset   Heart disease Mother        Before age 75   Diabetes Mother    Kidney disease Mother    Heart attack Mother 49   Lung cancer Father 43   Diabetes Brother    Heart disease Brother    Heart disease Brother    Arthritis Brother    Diabetes Sister    Fibromyalgia Sister    Lung cancer Paternal Uncle        questionable as to if it was lung ca   Healthy Daughter    Colon cancer Neg Hx    Stroke Neg Hx     Current Outpatient Medications  Medication Sig Dispense Refill   atorvastatin (LIPITOR) 20 MG tablet Take 20 mg by mouth daily.     cholecalciferol (VITAMIN D3) 25 MCG (1000 UNIT) tablet Take 1,000 Units by mouth daily.     clopidogrel (PLAVIX) 75 MG tablet Take 1 tablet (75 mg total) by mouth daily. 90 tablet 3   hydrochlorothiazide (MICROZIDE) 12.5 MG capsule Take 1 capsule (12.5 mg total) by mouth daily. 90 capsule 3   losartan (COZAAR) 25 MG tablet Take 2 tablets (50 mg total) by mouth daily. 180 tablet 1   nitroGLYCERIN (NITROSTAT) 0.4 MG SL tablet Place 1 tablet (0.4 mg total) under the tongue every 5 (five) minutes as needed for chest pain. 25 tablet 3   vitamin B-12 (CYANOCOBALAMIN) 1000 MCG tablet Take 1,000 mcg by mouth daily. Taking 5 days a week     No current facility-administered medications for this visit.    Allergies  Allergen Reactions   Niacin Other (See Comments)    intol to NIACIN w/ headaches      REVIEW OF SYSTEMS:   '[X]'$  denotes positive finding, '[ ]'$  denotes negative finding Cardiac  Comments:  Chest pain or chest pressure:    Shortness of breath upon exertion:    Short of breath when lying flat:    Irregular heart rhythm:        Vascular    Pain in calf, thigh, or hip brought on by ambulation:    Pain in feet at night that  wakes you up from your sleep:     Blood clot in your veins:    Leg swelling:         Pulmonary    Oxygen at home:  Productive cough:     Wheezing:         Neurologic    Sudden weakness in arms or legs:     Sudden numbness in arms or legs:     Sudden onset of difficulty speaking or slurred speech:    Temporary loss of vision in one eye:     Problems with dizziness:         Gastrointestinal    Blood in stool:     Vomited blood:         Genitourinary    Burning when urinating:     Blood in urine:        Psychiatric    Major depression:         Hematologic    Bleeding problems:    Problems with blood clotting too easily:        Skin    Rashes or ulcers:        Constitutional    Fever or chills:      PHYSICAL EXAMINATION:  Vitals:   08/08/21 1101 08/08/21 1104  BP: (!) 145/85 134/72  Pulse: 85   Temp: (!) 97.3 F (36.3 C)   TempSrc: Oral   SpO2: 97%   Weight: 224 lb 6.4 oz (101.8 kg)   Height: '6\' 1"'$  (1.854 m)     General:  WDWN in NAD; vital signs documented above Gait: Not observed HENT: WNL, normocephalic Pulmonary: normal non-labored breathing , without Rales, rhonchi,  wheezing Cardiac: regular HR Abdomen: soft, NT, no masses Skin: without rashes Extremities: without ischemic changes, without Gangrene , without cellulitis; without open wounds; varicose veins right lower leg worse than left; stasis pigmentation with cracking skin of bilateral lower legs Musculoskeletal: no muscle wasting or atrophy  Neurologic: A&O X 3;  No focal weakness or paresthesias are detected Psychiatric:  The pt has Normal affect.   Non-Invasive Vascular Imaging:   Incompetent right small saphenous vein  Incompetent left greater saphenous vein at the level of the knee   ASSESSMENT/PLAN:: 80 y.o. male here for follow up for varicose veins and edema of BLE  -Bilateral lower extremity venous reflux study was negative for DVT; only mild superficial venous reflux noted  bilaterally.  Patient would not be a candidate for vein ablation or stab phlebectomy at this time -Patient states he is not bothered much by his varicose veins or edema.  In the event that they become bothersome recommendations include 15 to 20 mmHg knee-high compression socks to be worn regularly.  He should continue to elevate his legs during the day to manage swelling symptoms.  He should also avoid prolonged sitting and standing. -Follow-up on an as-needed basis   Dagoberto Ligas, PA-C [facility] [phone]  Clinic MD:   Stanford Breed

## 2021-08-10 ENCOUNTER — Inpatient Hospital Stay (HOSPITAL_COMMUNITY): Admission: RE | Admit: 2021-08-10 | Payer: Medicare PPO | Source: Ambulatory Visit

## 2021-08-11 ENCOUNTER — Telehealth: Payer: Self-pay

## 2021-08-11 NOTE — Telephone Encounter (Signed)
I cannot see that any future labs have been ordered.   Routing to lab to see if they can figure out what he is suppose to be getting. If unclear, would recommend postponing f/u lab appointment until PCP returns so no blood work is missed

## 2021-08-11 NOTE — Telephone Encounter (Signed)
Ok to add IBC.

## 2021-08-11 NOTE — Telephone Encounter (Signed)
Pt left a message on triage line stating he has a lab appt coming up next week (8-16) and would like for Anda Kraft to add on an Iron panel.

## 2021-08-14 ENCOUNTER — Other Ambulatory Visit: Payer: Self-pay | Admitting: Primary Care

## 2021-08-14 DIAGNOSIS — E538 Deficiency of other specified B group vitamins: Secondary | ICD-10-CM

## 2021-08-14 DIAGNOSIS — E119 Type 2 diabetes mellitus without complications: Secondary | ICD-10-CM

## 2021-08-14 DIAGNOSIS — E559 Vitamin D deficiency, unspecified: Secondary | ICD-10-CM

## 2021-08-14 NOTE — Telephone Encounter (Signed)
Per problem list reduced to '20mg'$  due to side effects. Has 2 month labs tomorrow. Will call in one refill.

## 2021-08-15 ENCOUNTER — Other Ambulatory Visit (INDEPENDENT_AMBULATORY_CARE_PROVIDER_SITE_OTHER): Payer: Medicare PPO

## 2021-08-15 ENCOUNTER — Other Ambulatory Visit: Payer: Self-pay

## 2021-08-15 DIAGNOSIS — E782 Mixed hyperlipidemia: Secondary | ICD-10-CM | POA: Diagnosis not present

## 2021-08-15 DIAGNOSIS — I2510 Atherosclerotic heart disease of native coronary artery without angina pectoris: Secondary | ICD-10-CM

## 2021-08-15 DIAGNOSIS — E119 Type 2 diabetes mellitus without complications: Secondary | ICD-10-CM

## 2021-08-15 DIAGNOSIS — E559 Vitamin D deficiency, unspecified: Secondary | ICD-10-CM

## 2021-08-15 DIAGNOSIS — I10 Essential (primary) hypertension: Secondary | ICD-10-CM | POA: Diagnosis not present

## 2021-08-15 DIAGNOSIS — E538 Deficiency of other specified B group vitamins: Secondary | ICD-10-CM

## 2021-08-15 LAB — LIPID PANEL
Cholesterol: 116 mg/dL (ref 0–200)
HDL: 30.5 mg/dL — ABNORMAL LOW (ref >39.00)
LDL Cholesterol: 56 mg/dL (ref 0–99)
NonHDL: 85.38
Total CHOL/HDL Ratio: 4
Triglycerides: 147 mg/dL (ref 0.0–149.0)
VLDL: 29.4 mg/dL (ref 0.0–40.0)

## 2021-08-15 LAB — CBC
HCT: 44.7 % (ref 39.0–52.0)
Hemoglobin: 15.2 g/dL (ref 13.0–17.0)
MCHC: 33.9 g/dL (ref 30.0–36.0)
MCV: 102.9 fl — ABNORMAL HIGH (ref 78.0–100.0)
Platelets: 189 10*3/uL (ref 150.0–400.0)
RBC: 4.34 Mil/uL (ref 4.22–5.81)
RDW: 14.1 % (ref 11.5–15.5)
WBC: 5.9 10*3/uL (ref 4.0–10.5)

## 2021-08-15 LAB — IBC PANEL
Iron: 227 ug/dL — ABNORMAL HIGH (ref 42–165)
Saturation Ratios: 84.9 % — ABNORMAL HIGH (ref 20.0–50.0)
TIBC: 267.4 ug/dL (ref 250.0–450.0)
Transferrin: 191 mg/dL — ABNORMAL LOW (ref 212.0–360.0)

## 2021-08-15 LAB — HEMOGLOBIN A1C: Hgb A1c MFr Bld: 6.6 % — ABNORMAL HIGH (ref 4.6–6.5)

## 2021-08-15 LAB — C-REACTIVE PROTEIN: CRP: <1 mg/dL (ref 0.5–20.0)

## 2021-08-30 ENCOUNTER — Other Ambulatory Visit: Payer: Self-pay | Admitting: Primary Care

## 2021-08-30 DIAGNOSIS — I10 Essential (primary) hypertension: Secondary | ICD-10-CM

## 2021-10-13 ENCOUNTER — Telehealth: Payer: Self-pay | Admitting: Neurology

## 2021-10-13 NOTE — Telephone Encounter (Signed)
Patient returned call to Sheena. 

## 2021-10-13 NOTE — Telephone Encounter (Signed)
Tried calling pt, Someone pick up the phone and hung up

## 2021-10-13 NOTE — Telephone Encounter (Signed)
Pt would like to know if he is able to get a permanent handicap sign. He has an appt 10/18. Does he need to bring form or do we have them

## 2021-10-13 NOTE — Telephone Encounter (Signed)
Pt wanted to know if he needed to bring the form or if we had it at the office. Peer pt he will go ahead bring it with to the visit.

## 2021-10-17 NOTE — Progress Notes (Signed)
Virtual Visit via Video Note The purpose of this virtual visit is to provide medical care while limiting exposure to the novel coronavirus.    Consent was obtained for video visit:  Yes.   Answered questions that patient had about telehealth interaction:  Yes.   I discussed the limitations, risks, security and privacy concerns of performing an evaluation and management service by telemedicine. I also discussed with the patient that there may be a patient responsible charge related to this service. The patient expressed understanding and agreed to proceed.  Pt location: Home Physician Location: office Name of referring provider:  Pleas Koch, NP I connected with [name] at patients initiation/request on 10/18/2021 at  9:50 AM EDT by video enabled telemedicine application and verified that I am speaking with the correct person using two identifiers. 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  LDL goal less than 70  - Hgb A1c goal less than 7  - Normotensive blood pressure 2  Mediterranean diet 3. Tobacco cessation 4.  Use of cane outside home. 5.  Follow up 6 months.  History of Present Illness:  CINQUE BEGLEY is a 80 year old left-handed male with HTN, HLD, DMII, CAD, carotid artery disease s/p R CEA and history of TIA who follows up for stroke.  He is accompanied by his wife who supplements history.   UPDATE: Current medications: Plavix 75mg  daily, atorvastatin 10mg  daily, HCTZ, losartan  Underwent carotid endarterectomy on 05/18/2021 for asymptomatic left ICA  stenosis.  Labs from August:  LDL 56, Hgb A1c 6.6  Index and pinky fingers on left hand still numb in the tips.  Uses the cane outside of the house as recommended by his physical therapist.  Tries to walk 5 days a week for 30 minutes.     HISTORY: He was admitted to [facility] from 04/03/2021 to 04/06/2021 for transient left-sided numbness and weakness lasting an hour.  CT head was negative for follow up MRI of brain showed acute small right thalamic infarct with chronic small vessel ischemic changes and multiple chronic lacunar infarcts.  CTA of head and neck showed 70-80% left ICA stenosis.  2D echo showed EF 60-65% with no cardiac source of embolus.  LDL was 86 and Hgb A1c 6.7.  Prior to admission, hne was on ASA 81mg  daily.  He was discharged on ASA 81mg  and Plavix 75mg  daily for 3 weeks followed by Plavix alone.  Lipitor was increased from 10 to 40mg  daily.   Past Medical History: Past Medical History:  Diagnosis Date   Basal cell carcinoma 11/09/2019   nod & infil-behind right ear-cx3 &exc   Basal cell carcinoma 03/21/2020   Residual BCC with peripheral margin involved - ST recommends MOHs   Carotid artery occlusion    Coronary artery disease    s/p CABG 2005; sees Dr Johnsie Cancel yearly   Diabetes mellitus without complication ([place])    Gynecomastia    Hypercholesterolemia    Hypertension    Neurodermatitis    Overweight(278.02)    Personal history of colonic polyps 10/08/2006   tubular adenomas   Renal  insufficiency    Stroke [facility])    TIA  April 2022   Transient ischemic attack 2010   "lasted ~ 5 seconds"   Vitamin D deficiency     Medications: Outpatient Encounter Medications as of 10/18/2021  Medication Sig   atorvastatin (LIPITOR) 20 MG tablet TAKE 1/2 TABLET BY MOUTH EVERY DAY   cholecalciferol (VITAMIN D3) 25 MCG (1000 UNIT) tablet Take 1,000 Units by mouth daily.   clopidogrel (PLAVIX) 75 MG tablet Take 1 tablet (75 mg total) by mouth daily.   hydrochlorothiazide  (MICROZIDE) 12.5 MG capsule Take 1 capsule (12.5 mg total) by mouth daily.   losartan (COZAAR) 25 MG tablet Take 2 tablets (50 mg total) by mouth daily.   nitroGLYCERIN (NITROSTAT) 0.4 MG SL tablet Place 1 tablet (0.4 mg total) under the tongue every 5 (five) minutes as needed for chest pain.   vitamin B-12 (CYANOCOBALAMIN) 1000 MCG tablet Take 1,000 mcg by mouth daily. Taking 5 days a week   No facility-administered encounter medications on file as of 10/18/2021.    Allergies: Allergies  Allergen Reactions   Niacin Other (See Comments)    intol to NIACIN w/ headaches     Family History: Family History  Problem Relation Age of Onset   Heart disease Mother        Before age 32   Diabetes Mother    Kidney disease Mother    Heart attack Mother 83   Lung cancer Father 50   Diabetes Brother    Heart disease Brother    Heart disease Brother    Arthritis Brother    Diabetes Sister    Fibromyalgia Sister    Lung cancer Paternal Uncle        questionable as to if it was lung ca   Healthy Daughter    Colon cancer Neg Hx    Stroke Neg Hx     Observations/Objective:   There were no vitals taken for this visit. No acute distress.  Alert and oriented.  Speech fluent and not dysarthric.  Language intact.    Follow Up Instructions:    -I discussed the assessment and treatment plan with the patient. The patient was provided an opportunity to ask questions and all were answered. The patient agreed with the plan and demonstrated an understanding of the instructions.   The patient was advised to call back or seek an in-person evaluation if the symptoms worsen or if the condition fails to improve as anticipated.  Dudley Major, DO   Subjective:    PAST MEDICAL HISTORY: Past Medical History:  Diagnosis Date   Basal cell carcinoma 11/09/2019   nod & infil-behind right ear-cx3 &exc   Basal cell carcinoma 03/21/2020   Residual BCC with peripheral margin involved - ST  recommends [facility]   Carotid artery occlusion    Coronary artery disease    s/p CABG 2005; sees Dr Johnsie Cancel yearly   Diabetes mellitus without complication ([place])    Gynecomastia    Hypercholesterolemia    Hypertension    Neurodermatitis    Overweight(278.02)    Personal history of colonic polyps 10/08/2006   tubular adenomas   Renal insufficiency    Stroke [facility])    TIA  April 2022   Transient ischemic attack 2010   "lasted ~ 5 seconds"   Vitamin D deficiency     MEDICATIONS: Current Outpatient Medications on File Prior to Visit  Medication Sig Dispense Refill   atorvastatin (LIPITOR) 20 MG tablet TAKE 1/2  TABLET BY MOUTH EVERY DAY 30 tablet 0   cholecalciferol (VITAMIN D3) 25 MCG (1000 UNIT) tablet Take 1,000 Units by mouth daily.     clopidogrel (PLAVIX) 75 MG tablet Take 1 tablet (75 mg total) by mouth daily. 90 tablet 3   hydrochlorothiazide (MICROZIDE) 12.5 MG capsule Take 1 capsule (12.5 mg total) by mouth daily. 90 capsule 3   losartan (COZAAR) 25 MG tablet Take 2 tablets (50 mg total) by mouth daily. 180 tablet 1   nitroGLYCERIN (NITROSTAT) 0.4 MG SL tablet Place 1 tablet (0.4 mg total) under the tongue every 5 (five) minutes as needed for chest pain. 25 tablet 3   vitamin B-12 (CYANOCOBALAMIN) 1000 MCG tablet Take 1,000 mcg by mouth daily. Taking 5 days a week     No current facility-administered medications on file prior to visit.    ALLERGIES: Allergies  Allergen Reactions   Niacin Other (See Comments)    intol to NIACIN w/ headaches     FAMILY HISTORY: Family History  Problem Relation Age of Onset   Heart disease Mother        Before age 23   Diabetes Mother    Kidney disease Mother    Heart attack Mother 92   Lung cancer Father 16   Diabetes Brother    Heart disease Brother    Heart disease Brother    Arthritis Brother    Diabetes Sister    Fibromyalgia Sister    Lung cancer Paternal Uncle        questionable as to if it was lung ca   Healthy Daughter     Colon cancer Neg Hx    Stroke Neg Hx       Objective:  .vitalsn General: No acute distress.  Patient appears well-groomed.   Head:  Normocephalic/atraumatic Eyes:  Fundi examined but not visualized Neck: supple, no paraspinal tenderness, full range of motion Heart:  Regular rate and rhythm Lungs:  Clear to auscultation bilaterally Back: No paraspinal tenderness Neurological Exam: alert and oriented to person, place, and time.  Speech fluent and not dysarthric, language intact.  CN II-XII intact. Bulk and tone normal, muscle strength 5/5 throughout.  Sensation to light touch intact.  Deep tendon reflexes 2+ throughout, toes downgoing.  Finger to nose testing intact.  Gait normal, Romberg negative.   Metta Clines, DO  CC: Alma Friendly, NP

## 2021-10-18 ENCOUNTER — Telehealth (INDEPENDENT_AMBULATORY_CARE_PROVIDER_SITE_OTHER): Payer: Medicare PPO | Admitting: Neurology

## 2021-10-18 ENCOUNTER — Encounter: Payer: Self-pay | Admitting: Neurology

## 2021-10-18 ENCOUNTER — Other Ambulatory Visit: Payer: Self-pay

## 2021-10-18 DIAGNOSIS — E782 Mixed hyperlipidemia: Secondary | ICD-10-CM | POA: Diagnosis not present

## 2021-10-18 DIAGNOSIS — Z720 Tobacco use: Secondary | ICD-10-CM

## 2021-10-18 DIAGNOSIS — I6381 Other cerebral infarction due to occlusion or stenosis of small artery: Secondary | ICD-10-CM | POA: Diagnosis not present

## 2021-10-18 DIAGNOSIS — E119 Type 2 diabetes mellitus without complications: Secondary | ICD-10-CM | POA: Diagnosis not present

## 2021-10-18 DIAGNOSIS — I6522 Occlusion and stenosis of left carotid artery: Secondary | ICD-10-CM | POA: Diagnosis not present

## 2021-10-18 DIAGNOSIS — I10 Essential (primary) hypertension: Secondary | ICD-10-CM

## 2021-10-18 DIAGNOSIS — I2510 Atherosclerotic heart disease of native coronary artery without angina pectoris: Secondary | ICD-10-CM | POA: Diagnosis not present

## 2021-11-02 ENCOUNTER — Encounter: Payer: Self-pay | Admitting: Primary Care

## 2021-11-02 DIAGNOSIS — E119 Type 2 diabetes mellitus without complications: Secondary | ICD-10-CM | POA: Diagnosis not present

## 2021-11-02 DIAGNOSIS — H33303 Unspecified retinal break, bilateral: Secondary | ICD-10-CM | POA: Diagnosis not present

## 2021-11-02 DIAGNOSIS — H26493 Other secondary cataract, bilateral: Secondary | ICD-10-CM | POA: Diagnosis not present

## 2021-11-02 DIAGNOSIS — H5201 Hypermetropia, right eye: Secondary | ICD-10-CM | POA: Diagnosis not present

## 2021-11-02 LAB — HM DIABETES EYE EXAM

## 2021-11-17 ENCOUNTER — Encounter: Payer: Self-pay | Admitting: Neurology

## 2021-12-02 ENCOUNTER — Other Ambulatory Visit: Payer: Self-pay | Admitting: Primary Care

## 2021-12-02 DIAGNOSIS — I10 Essential (primary) hypertension: Secondary | ICD-10-CM

## 2022-01-08 ENCOUNTER — Telehealth (INDEPENDENT_AMBULATORY_CARE_PROVIDER_SITE_OTHER): Payer: Medicare PPO | Admitting: Nurse Practitioner

## 2022-01-08 ENCOUNTER — Encounter: Payer: Self-pay | Admitting: Nurse Practitioner

## 2022-01-08 VITALS — BP 147/67 | HR 92

## 2022-01-08 DIAGNOSIS — Z8616 Personal history of COVID-19: Secondary | ICD-10-CM | POA: Insufficient documentation

## 2022-01-08 DIAGNOSIS — U071 COVID-19: Secondary | ICD-10-CM | POA: Diagnosis not present

## 2022-01-08 HISTORY — DX: Personal history of COVID-19: Z86.16

## 2022-01-08 MED ORDER — MOLNUPIRAVIR EUA 200MG CAPSULE: 4.0000 | ORAL_CAPSULE | Freq: Two times a day (BID) | ORAL | 0 refills | Status: AC

## 2022-01-08 NOTE — Progress Notes (Signed)
Patient ID: [name] [name], male    DOB: 18-May-1941, 81 y.o.   MRN: [id number]  Virtual visit completed through [place], a video enabled telemedicine application. Due to national recommendations of social distancing due to COVID-19, a virtual visit is felt to be most appropriate for this patient at this time. Reviewed limitations, risks, security and privacy concerns of performing a virtual visit and the availability of in person appointments. I also reviewed that there may be a patient responsible charge related to this service. The patient agreed to proceed.   Attempted to connect via video enabled program. After being unsuccessful we revert to a telephone encounter.  Phone 10 mins and 5 seconds  Patient location: home Provider location: [place] at [facility], office Persons participating in this virtual visit: patient, provider   If any vitals were documented, they were collected by patient at home unless specified below.    BP (!) 147/67 Comment: per patient this morning   Pulse 92    CC: Covid 19 Subjective:   HPI: [name] [name] is a 81 y.o. male presenting on 01/08/2022 for Covid Positive (On 01/07/21, sx started on 01/07/21- cough (cough has lasted x 3 weeks though), some stuffy nose.)   Symptoms started on 01/07/2022 Covid test positive 01/07/2022 at home [place] vaccines x2 and 2 boosters At a funeral over the holidays. Brother stopped by and had a stuffy nose. Vitamin C OTC   Relevant past medical, surgical, family and social history reviewed and updated as indicated. Interim medical history since our last visit reviewed. Allergies and medications reviewed and updated. Outpatient Medications Prior to Visit  Medication Sig Dispense Refill   atorvastatin (LIPITOR) 20 MG tablet TAKE 1/2 TABLET BY MOUTH EVERY DAY 30 tablet 0   cholecalciferol (VITAMIN D3) 25 MCG (1000 UNIT) tablet Take 1,000 Units by mouth daily.     clopidogrel (PLAVIX) 75 MG tablet Take 1 tablet (75  mg total) by mouth daily. 90 tablet 3   hydrochlorothiazide (MICROZIDE) 12.5 MG capsule Take 1 capsule (12.5 mg total) by mouth daily. 90 capsule 3   losartan (COZAAR) 25 MG tablet Take 2 tablets (50 mg total) by mouth daily. For blood pressure. 180 tablet 1   nitroGLYCERIN (NITROSTAT) 0.4 MG SL tablet Place 1 tablet (0.4 mg total) under the tongue every 5 (five) minutes as needed for chest pain. 25 tablet 3   vitamin B-12 (CYANOCOBALAMIN) 1000 MCG tablet Take 1,000 mcg by mouth daily. Taking 5 days a week     No facility-administered medications prior to visit.     Per HPI unless specifically indicated in ROS section below Review of Systems  Constitutional:  Positive for fatigue. Negative for chills and fever.  HENT:  Positive for congestion. Negative for ear discharge, ear pain, sinus pressure, sinus pain and sore throat.   Respiratory:  Positive for cough (clear). Negative for shortness of breath.   Cardiovascular:  Negative for chest pain.  Gastrointestinal:  Negative for abdominal pain, diarrhea, nausea and vomiting.  Musculoskeletal:  Negative for arthralgias and myalgias.  Neurological:  Negative for headaches.  Objective:  BP (!) 147/67 Comment: per patient this morning   Pulse 92   Wt Readings from Last 3 Encounters:  08/08/21 224 lb 6.4 oz (101.8 kg)  07/14/21 222 lb 12.8 oz (101.1 kg)  07/04/21 223 lb (101.2 kg)       Physical exam: Gen: alert, NAD, not ill appearing Pulm: speaks in complete sentences without increased work of breathing  Psych: normal mood, normal thought content      Results for orders placed or performed in visit on 11/02/21  HM DIABETES EYE EXAM  Result Value Ref Range   HM Diabetic Eye Exam No Retinopathy No Retinopathy   Assessment & Plan:   Problem List Items Addressed This Visit       Other   COVID-19 - Primary    Discussed treatment options with patient.  After joint discussion did decide to pursue antiviral treatment.  Did discuss with  patient antiviral treatment is EUA only.  Did discuss common reported side effects of medication.  Did discuss signs and symptoms when to seek urgent or emergent health care.  Also discussed CDC guidelines/recommendations in regards to quarantining. Start molnupiravir as soon as possible      Relevant Medications   molnupiravir EUA (LAGEVRIO) 200 mg CAPS capsule     Meds ordered this encounter  Medications   molnupiravir EUA (LAGEVRIO) 200 mg CAPS capsule    Sig: Take 4 capsules (800 mg total) by mouth 2 (two) times daily for 5 days.    Dispense:  40 capsule    Refill:  0    Order Specific Question:   Supervising Provider    Answer:   TOWER, MARNE A [[id number]]   No orders of the defined types were placed in this encounter.   I discussed the assessment and treatment plan with the patient. The patient was provided an opportunity to ask questions and all were answered. The patient agreed with the plan and demonstrated an understanding of the instructions. The patient was advised to call back or seek an in-person evaluation if the symptoms worsen or if the condition fails to improve as anticipated.  Follow up plan: No follow-ups on file.  Romilda Garret, NP

## 2022-01-08 NOTE — Assessment & Plan Note (Signed)
Discussed treatment options with patient.  After joint discussion did decide to pursue antiviral treatment.  Did discuss with patient antiviral treatment is EUA only.  Did discuss common reported side effects of medication.  Did discuss signs and symptoms when to seek urgent or emergent health care.  Also discussed CDC guidelines/recommendations in regards to quarantining. Start molnupiravir as soon as possible

## 2022-01-19 ENCOUNTER — Telehealth: Payer: Self-pay | Admitting: *Deleted

## 2022-01-19 NOTE — Telephone Encounter (Signed)
Mr Doubleday called and reports he finished his outpt therapy this past June and feels like he needs to go back. He is asking for a new referral.

## 2022-01-22 NOTE — Telephone Encounter (Signed)
He is seeing his PCP this week and will talk to them about it.

## 2022-01-22 NOTE — Progress Notes (Signed)
Patient ID: [name] [name], male   DOB: [name], 81 y.o.   MRN: [id number]     Cardiology Office Note   Date:  01/25/2022   ID:  [name] [name], [name] [name], MRN [id number]  PCP:  [name] Koch, NP  Cardiologist:  Dr. Johnsie Cancel    No chief complaint on file.      History of Present Illness: [name] [name] is a 81 y.o. male  known coronary disease with previous coronary bypass surgery in 2005. he has normal LV function.  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 70-80% proximal ICA but no laterality given Last duplex 10/17/20 had patent right CEA and 40-59% Left ICA stenosis Infarct felt to be from small vessel disease with associated lacunar infarcts but recommended f/u with VVS for left ICA   Seen by DR Early and had left CEA 05/18/21   He has more LE edema This is not CHF He has venous insufficiency and varicosities Coreg was d/c but has not helped a lot. Needs more diuretic.  He was placed on lasix but preferred HCTZ Statin dose increased in hospital during TIA wife would like him to be on lowest dose possible to reach target   COVID positive 01/08/22 has had 4 vaccines Rx with Molnupiravir   Wants to go back to neuro rehab as COVID made him feel very fatigued and weak   Past Medical History:  Diagnosis Date   Basal cell carcinoma 11/09/2019   nod & infil-behind right ear-cx3 &exc   Basal cell carcinoma 03/21/2020   Residual BCC with peripheral margin involved - ST  recommends [facility]   Carotid artery occlusion    Coronary artery disease    s/p CABG 2005; sees Dr Johnsie Cancel yearly   Diabetes mellitus without complication ([place])    Gynecomastia    Hypercholesterolemia    Hypertension    Neurodermatitis    Overweight(278.02)    Personal history of colonic polyps 10/08/2006   tubular adenomas   Renal insufficiency    Stroke [facility])    TIA  April 2022   Transient ischemic attack 2010   "lasted ~ 5 seconds"   Vitamin D deficiency     Past Surgical History:  Procedure Laterality Date   CARDIAC CATHETERIZATION  02/2004   "tried to stent; couldn't"   CAROTID ENDARTERECTOMY Right 08/26/2015   COLONOSCOPY     CORONARY ANGIOPLASTY     CORONARY ARTERY BYPASS GRAFT  Feb. 2005   4 vessel   ENDARTERECTOMY Right 08/26/2015   Procedure: Right Carotid ENDARTERECTOMY with Patch Angioplasty ;  Surgeon: Rosetta Posner, MD;  [facility];  Service: Vascular;  Laterality: Right;   ENDARTERECTOMY Left 05/18/2021   Procedure: LEFT CAROTID ARTERY ENDARTERECTOMY  with patch angioplasty;  Surgeon: Rosetta Posner, MD;  Location: [place];  Service: Vascular;  Laterality: Left;   EYE SURGERY     bilateral cataract   KELOID EXCISION  04/2008  on chest scar; Dr. Dessie Coma   KELOID EXCISION     PILONIDAL CYST EXCISION  1989     Current Outpatient Medications  Medication Sig Dispense Refill   atorvastatin (LIPITOR) 20 MG tablet TAKE 1/2 TABLET BY MOUTH EVERY DAY 30 tablet 0   cholecalciferol (VITAMIN D3) 25 MCG (1000 UNIT) tablet Take 1,000 Units by mouth daily.     clopidogrel (PLAVIX) 75 MG tablet Take 1 tablet (75 mg total) by mouth daily. 90 tablet 3   hydrochlorothiazide (MICROZIDE) 12.5 MG capsule Take 1 capsule (12.5 mg total) by mouth daily. 90 capsule 3   losartan (COZAAR) 25 MG tablet Take 2 tablets (50 mg total) by mouth daily. For blood pressure. 180 tablet 1   nitroGLYCERIN (NITROSTAT) 0.4 MG SL tablet Place 1 tablet (0.4 mg total) under the tongue every 5 (five)  minutes as needed for chest pain. 25 tablet 3   vitamin B-12 (CYANOCOBALAMIN) 1000 MCG tablet Take 1,000 mcg by mouth daily. Taking 5 days a week     No current facility-administered medications for this visit.    Allergies:   Niacin    Social History:  The patient  reports that he has never smoked. His smokeless tobacco use includes snuff. He reports that he does not drink alcohol and does not use drugs.   Family History:  The patient's family history includes Arthritis in his brother; Diabetes in his brother, mother, and sister; Fibromyalgia in his sister; Healthy in his daughter; Heart attack (age of onset: 50) in his mother; Heart disease in his brother, brother, and mother; Kidney disease in his mother; Lung cancer in his paternal uncle; Lung cancer (age of onset: 57) in his father.    ROS:  General:no colds or fevers, no weight changes Skin:no rashes or ulcers HEENT:no blurred vision, no congestion CV:see HPI PUL:see HPI GI:no diarrhea constipation or melena, no indigestion GU:no hematuria, no dysuria MS:no joint pain, no claudication Neuro:no syncope, no lightheadedness Endo:no diabetes, no thyroid disease  Wt Readings from Last 3 Encounters:  01/25/22 224 lb 6.4 oz (101.8 kg)  08/08/21 224 lb 6.4 oz (101.8 kg)  07/14/21 222 lb 12.8 oz (101.1 kg)     PHYSICAL EXAM: VS:  BP (!) 120/52    Pulse 91    Ht 6\' 1"  (1.854 m)    Wt 224 lb 6.4 oz (101.8 kg)    SpO2 96%    BMI 29.61 kg/m  , BMI Body mass index is 29.61 kg/m. Affect appropriate Healthy:  appears stated age 89: normal Neck supple with no adenopathy Post bilateral CEA  JVP normal post bilateral CEA  Lungs clear with no wheezing and good diaphragmatic motion Heart:  S1/S2 no murmur, no rub, gallop or click PMI normal Abdomen: benighn, BS positve, no tenderness, no AAA no bruit.  No HSM or HJR Distal pulses intact with no bruits Plus one bilateral edema with varicosities  Neuro mild dysarthria  Skin warm and  dry No muscular weakness    EKG:   10/16/19 SR rate 70 nonspecific ST changes   Recent Labs: 04/03/2021: TSH 2.483 04/05/2021: Magnesium 2.0 06/15/2021: ALT 18 08/03/2021: BUN 19; Creatinine, Ser 1.06; NT-Pro BNP 300; Potassium 4.2; Sodium 143 08/15/2021: Hemoglobin 15.2; Platelets 189.0    Lipid Panel    Component Value Date/Time   CHOL 116 08/15/2021 0734   TRIG 147.0 08/15/2021 0734   HDL 30.50 (L) 08/15/2021 0734   CHOLHDL 4 08/15/2021 0734   VLDL 29.4 08/15/2021 0734  [place] 56 08/15/2021 0734       Other studies Reviewed: Additional studies/ records that were reviewed today include: previous notes, nuc study.and carotid    ASSESSMENT AND PLAN:  1.  CAD s/p CABG 2005 Had no symptoms before CABG  Non ischemic myovue 09/09/18 continue medical Rx    2. Carotid:  Post right CEA Thalamic stroke April 2022 CTA suggested left ICA stenosis now post Left CEA by  Dr Donnetta Hutching 05/18/21 f/u duplex   3.  HTN  Controlled  On beta blocker and ARB   4. Hyperlipidemia stable on statin. Last LDL 56 on 20 mg lipitor   5. DM:  Discussed low carb diet.  Target hemoglobin A1c is 6.5 or less.  Continue current medications. He is getting Lab work in June with primary   6. Edema:  from LE venous disease Reflux documented by duplex 08/02/21 he prefers to be on HCTZ not Lasix Support hose   7. COVID:  01/08/22 Rx with antiviral no CXR done has had 4 vaccines   Time spent reviewing hospital records, and all imaging modalities as well as consults direct patient interview, exam and composing note 45 minutes   F/U VVS Carotid duplex post bilateral CEA F/U Cardiology in a year     [name]

## 2022-01-25 ENCOUNTER — Other Ambulatory Visit: Payer: Self-pay

## 2022-01-25 ENCOUNTER — Ambulatory Visit: Payer: Medicare PPO | Admitting: Primary Care

## 2022-01-25 ENCOUNTER — Ambulatory Visit: Payer: Medicare PPO | Admitting: Cardiovascular Disease

## 2022-01-25 ENCOUNTER — Encounter: Payer: Self-pay | Admitting: Cardiovascular Disease

## 2022-01-25 ENCOUNTER — Encounter: Payer: Self-pay | Admitting: Primary Care

## 2022-01-25 VITALS — BP 120/52 | HR 91 | Ht 73.0 in | Wt 224.4 lb

## 2022-01-25 VITALS — BP 136/62 | HR 96 | Temp 98.7°F | Ht 73.0 in | Wt 224.0 lb

## 2022-01-25 DIAGNOSIS — Z8673 Personal history of transient ischemic attack (TIA), and cerebral infarction without residual deficits: Secondary | ICD-10-CM | POA: Diagnosis not present

## 2022-01-25 DIAGNOSIS — Z951 Presence of aortocoronary bypass graft: Secondary | ICD-10-CM | POA: Diagnosis not present

## 2022-01-25 DIAGNOSIS — Z9889 Other specified postprocedural states: Secondary | ICD-10-CM

## 2022-01-25 DIAGNOSIS — R600 Localized edema: Secondary | ICD-10-CM | POA: Diagnosis not present

## 2022-01-25 DIAGNOSIS — Z8616 Personal history of COVID-19: Secondary | ICD-10-CM

## 2022-01-25 DIAGNOSIS — R2681 Unsteadiness on feet: Secondary | ICD-10-CM

## 2022-01-25 DIAGNOSIS — G459 Transient cerebral ischemic attack, unspecified: Secondary | ICD-10-CM

## 2022-01-25 NOTE — Assessment & Plan Note (Signed)
Imbalance and gait deteriorated since infection in January 2023.   Referral placed to outpatient rehab.

## 2022-01-25 NOTE — Assessment & Plan Note (Signed)
In April 2022.  Reviewed hospital discharge notes from visit in April 2022.  Agree that he would benefit from outpatient PT. Referral placed.

## 2022-01-25 NOTE — Patient Instructions (Signed)
You will be contacted regarding your referral to outpatient rehab.  Please let us know if you have not been contacted within two weeks.   Use your can when you walk for stability.   It was a pleasure to see you today!

## 2022-01-25 NOTE — Assessment & Plan Note (Signed)
Deteriorated since early December 2022 and exacerbated by Covid-19 infection in early January 2023.  Agree that he would benefit from outpatient rehab, will place referral today.  Also discussed proper use of his cane when walking.

## 2022-01-25 NOTE — Progress Notes (Signed)
Subjective:    Patient ID: [name] [name], male    DOB: Jun 12, 1941, 81 y.o.   MRN: [id number]  HPI  [name] [name] is a very pleasant 81 y.o. male with a history of hypertension, CAD, type 2 diabetes, CVA, hyperlipidemia, Covid-19 infection who presents today requesting referral to PT. His wife joins [place] today.   History of CVA in April 2022, admitted to [facility] and then discharged to inpatient rehab. Once discharged from inpatient rehab he was referred to outpatient rehab. He complete outpatient rehab in July 2022 and was doing very well at the time.  He contracted Covid-19 infection on 01/07/22, treated with antiviral medication- molnupiravir.   Today he endorses Covid-19 symptoms have mostly resolved, only has a slight cough. He is mostly concerned as his strength and balance have declined beginning around early December 2023, worse after contracting Covid-19 in early January 2023.  His wife endorses his posture has been worse since the stroke last year, he leans forward a lot when walking. She's concerned he may fall.   He is using his cane mostly now for stability, doesn't feel confident without using his cane or without holding onto something.   He denies falls, new stroke symptoms. He does have residual left lower extremity weakness and numbness to his left 2nd and 3rd digits.     Review of Systems  Constitutional:  Positive for fatigue.  Respiratory:  Positive for cough.   Neurological:  Positive for weakness and numbness. Negative for light-headedness.       Imbalance when walking, see HPI        Past Medical History:  Diagnosis Date   Basal cell carcinoma 11/09/2019   nod & infil-behind right ear-cx3 &exc   Basal cell carcinoma 03/21/2020   Residual BCC with peripheral margin involved - ST recommends MOHs   Carotid artery occlusion    Coronary artery disease    s/p CABG 2005; sees Dr Johnsie Cancel yearly   Diabetes mellitus without complication ([place])     Gynecomastia    Hypercholesterolemia    Hypertension    Neurodermatitis    Overweight(278.02)    Personal history of colonic polyps 10/08/2006   tubular adenomas   Renal insufficiency    Stroke [facility])    TIA  April 2022   Transient ischemic attack 2010   "lasted ~ 5 seconds"   Vitamin D deficiency     Social History   Socioeconomic History   Marital status: Married    Spouse name: [name]   Number of children: 1   Years of education: Not on file   Highest education level: Some college, no degree  Occupational History   Occupation: retired  Tobacco Use   Smoking status: Never   Smokeless tobacco: Current    Types: Snuff   Tobacco comments:    uses 2 dip/week  Vaping Use   Vaping Use: Never used  Substance and Sexual Activity   Alcohol use: No    Alcohol/week: 0.0 standard drinks   Drug use: No   Sexual activity: Yes  Other Topics Concern   Not on file  Social History Narrative   Retired.   Once worked for the [employer].   Married.   Enjoys reading, spending time with family.    Left handed   Drinks caffeine   One story home   Social Determinants of Health   Financial Resource Strain: Low Risk    Difficulty of Paying Living Expenses: Not hard at all  Food Insecurity: No Food Insecurity   Worried About Charity fundraiser in the Last Year: Never true   Ran Out of Food in the Last Year: Never true  Transportation Needs: No Transportation Needs   Lack of Transportation (Medical): No   Lack of Transportation (Non-Medical): No  Physical Activity: Insufficiently Active   Days of Exercise per Week: 1 day   Minutes of Exercise per Session: 20 min  Stress: No Stress Concern Present   Feeling of Stress : Not at all  Social Connections: Moderately Integrated   Frequency of Communication with Friends and Family: More than three times a week   Frequency of Social Gatherings with Friends and Family: More than three times a week   Attends Religious Services: More than 4 times  per year   Active Member of Clubs or Organizations: No   Attends Archivist Meetings: Never   Marital Status: Married  Human resources officer Violence: Not on file    Past Surgical History:  Procedure Laterality Date   CARDIAC CATHETERIZATION  02/2004   "tried to stent; couldn't"   CAROTID ENDARTERECTOMY Right 08/26/2015   COLONOSCOPY     CORONARY ANGIOPLASTY     CORONARY ARTERY BYPASS GRAFT  Feb. 2005   4 vessel   ENDARTERECTOMY Right 08/26/2015   Procedure: Right Carotid ENDARTERECTOMY with Patch Angioplasty ;  Surgeon: Rosetta Posner, MD;  Location: [facility] OR;  Service: Vascular;  Laterality: Right;   ENDARTERECTOMY Left 05/18/2021   Procedure: LEFT CAROTID ARTERY ENDARTERECTOMY  with patch angioplasty;  Surgeon: Rosetta Posner, MD;  Location: [facility] OR;  Service: Vascular;  Laterality: Left;   EYE SURGERY     bilateral cataract   KELOID EXCISION  04/2008   on chest scar; Dr. Dessie Coma   KELOID EXCISION     PILONIDAL CYST EXCISION  1989    Family History  Problem Relation Age of Onset   Heart disease Mother        Before age 12   Diabetes Mother    Kidney disease Mother    Heart attack Mother 31   Lung cancer Father 69   Diabetes Brother    Heart disease Brother    Heart disease Brother    Arthritis Brother    Diabetes Sister    Fibromyalgia Sister    Lung cancer Paternal Uncle        questionable as to if it was lung ca   Healthy Daughter    Colon cancer Neg Hx    Stroke Neg Hx     Allergies  Allergen Reactions   Niacin Other (See Comments)    intol to NIACIN w/ headaches     Current Outpatient Medications on File Prior to Visit  Medication Sig Dispense Refill   atorvastatin (LIPITOR) 20 MG tablet TAKE 1/2 TABLET BY MOUTH EVERY DAY 30 tablet 0   cholecalciferol (VITAMIN D3) 25 MCG (1000 UNIT) tablet Take 1,000 Units by mouth daily.     clopidogrel (PLAVIX) 75 MG tablet Take 1 tablet (75 mg total) by mouth daily. 90 tablet 3   hydrochlorothiazide (MICROZIDE) 12.5  MG capsule Take 1 capsule (12.5 mg total) by mouth daily. 90 capsule 3   losartan (COZAAR) 25 MG tablet Take 2 tablets (50 mg total) by mouth daily. For blood pressure. 180 tablet 1   nitroGLYCERIN (NITROSTAT) 0.4 MG SL tablet Place 1 tablet (0.4 mg total) under the tongue every 5 (five) minutes as needed for chest pain. 25 tablet 3  vitamin B-12 (CYANOCOBALAMIN) 1000 MCG tablet Take 1,000 mcg by mouth daily. Taking 5 days a week     No current facility-administered medications on file prior to visit.    BP 136/62    Pulse 96    Temp 98.7 F (37.1 C) (Temporal)    Ht 6\' 1"  (1.854 m)    Wt 224 lb (101.6 kg)    SpO2 (!) 83%    BMI 29.55 kg/m  Objective:   Physical Exam Cardiovascular:     Rate and Rhythm: Normal rate and regular rhythm.  Pulmonary:     Effort: Pulmonary effort is normal.  Musculoskeletal:     Cervical back: Neck supple.  Skin:    General: Skin is warm and dry.  Neurological:     Mental Status: He is alert and oriented to person, place, and time.     Comments: Mild imbalance in gait noted when walking around the exam room.  Carries cane while walking.   Able to get up and down from chair in exam room.           Assessment & Plan:      This visit occurred during the SARS-CoV-2 public health emergency.  Safety protocols were in place, including screening questions prior to the visit, additional usage of staff PPE, and extensive cleaning of exam room while observing appropriate contact time as indicated for disinfecting solutions.

## 2022-01-25 NOTE — Patient Instructions (Addendum)
Medication Instructions:  NO CHANGES *If you need a refill on your cardiac medications before your next appointment, please call your pharmacy*   Lab Work: NONE If you have labs (blood work) drawn today and your tests are completely normal, you will receive your results only by: [place] (if you have MyChart) OR A paper copy in the mail If you have any lab test that is abnormal or we need to change your treatment, we will call you to review the results.   Testing/Procedures: NONE   Follow-Up: At [facility], you and your health needs are our priority.  As part of our continuing mission to provide you with exceptional heart care, we have created designated Provider Care Teams.  These Care Teams include your primary Cardiologist (physician) and Advanced Practice Providers (APPs -  Physician Assistants and Nurse Practitioners) who all work together to provide you with the care you need, when you need it.  We recommend signing up for the patient portal called "MyChart".  Sign up information is provided on this After Visit Summary.  MyChart is used to connect with patients for Virtual Visits (Telemedicine).  Patients are able to view lab/test results, encounter notes, upcoming appointments, etc.  Non-urgent messages can be sent to your provider as well.   To learn more about what you can do with MyChart, go to [email or web address].    Your next appointment:   YEAR   The format for your next appointment:   In Person  Provider:   [name], MD     Other Instructions NONE

## 2022-01-30 ENCOUNTER — Ambulatory Visit: Payer: Medicare PPO | Admitting: Physical Medicine & Rehabilitation

## 2022-02-06 ENCOUNTER — Encounter: Payer: Self-pay | Admitting: Physical Therapy

## 2022-02-06 ENCOUNTER — Other Ambulatory Visit: Payer: Self-pay

## 2022-02-06 ENCOUNTER — Ambulatory Visit: Payer: Medicare PPO | Attending: Primary Care | Admitting: Physical Therapy

## 2022-02-06 DIAGNOSIS — R293 Abnormal posture: Secondary | ICD-10-CM | POA: Insufficient documentation

## 2022-02-06 DIAGNOSIS — M6281 Muscle weakness (generalized): Secondary | ICD-10-CM | POA: Diagnosis not present

## 2022-02-06 DIAGNOSIS — R2689 Other abnormalities of gait and mobility: Secondary | ICD-10-CM | POA: Diagnosis not present

## 2022-02-06 DIAGNOSIS — Z8673 Personal history of transient ischemic attack (TIA), and cerebral infarction without residual deficits: Secondary | ICD-10-CM | POA: Insufficient documentation

## 2022-02-06 DIAGNOSIS — R2681 Unsteadiness on feet: Secondary | ICD-10-CM | POA: Diagnosis not present

## 2022-02-06 NOTE — Addendum Note (Signed)
Addended by: Arliss Journey on: 02/06/2022 01:06 PM   Modules accepted: Orders

## 2022-02-06 NOTE — Therapy (Addendum)
[facility] [address] [place], [place], [address] Phone: [phone]   Fax:  [phone]  Physical Therapy Evaluation  Patient Details  Name: [name] [name] MRN: [id number] Date of Birth: 06/11/1941 Referring Provider (PT): Pleas Koch, NP   Encounter Date: 02/06/2022   PT End of Session - 02/06/22 1141     Visit Number 1    Number of Visits 17    Date for PT Re-Evaluation 05/07/22    Authorization Type [employer] - will need auth    PT Start Time 1020    PT Stop Time 1105    PT Time Calculation (min) 45 min    Equipment Utilized During Treatment Gait belt    Activity Tolerance Patient tolerated treatment well    Behavior During Therapy [facility] for tasks assessed/performed             Past Medical History:  Diagnosis Date   Basal cell carcinoma 11/09/2019   nod & infil-behind right ear-cx3 &exc   Basal cell carcinoma 03/21/2020   Residual BCC with peripheral margin involved - ST recommends [facility]   Carotid artery occlusion    Coronary artery disease    s/p CABG 2005; sees Dr Johnsie Cancel yearly   Diabetes mellitus without complication ([place])    Gynecomastia    Hypercholesterolemia    Hypertension    Neurodermatitis    Overweight(278.02)    Personal history of colonic polyps 10/08/2006   tubular adenomas   Renal insufficiency    Stroke [facility])    TIA  April 2022   Transient ischemic attack 2010   "lasted ~ 5 seconds"   Vitamin D deficiency     Past Surgical History:  Procedure Laterality Date   CARDIAC CATHETERIZATION  02/2004   "tried to stent; couldn't"   CAROTID ENDARTERECTOMY Right 08/26/2015   COLONOSCOPY     CORONARY ANGIOPLASTY     CORONARY ARTERY BYPASS GRAFT  Feb. 2005   4 vessel   ENDARTERECTOMY Right 08/26/2015   Procedure: Right Carotid ENDARTERECTOMY with Patch Angioplasty ;  Surgeon: Rosetta Posner, MD;  Location: [facility] OR;  Service: Vascular;  Laterality: Right;   ENDARTERECTOMY  Left 05/18/2021   Procedure: LEFT CAROTID ARTERY ENDARTERECTOMY  with patch angioplasty;  Surgeon: Rosetta Posner, MD;  Location: [facility] OR;  Service: Vascular;  Laterality: Left;   EYE SURGERY     bilateral cataract   KELOID EXCISION  04/2008   on chest scar; Dr. Dessie Coma   KELOID EXCISION     PILONIDAL CYST EXCISION  1989    There were no vitals filed for this visit.    Subjective Assessment - 02/06/22 1025     Subjective Got therapy here last year and did really well after his CVA. Got diganosed with COVID in January 2023. Reports it has zapped him of all of his strength. Started to notice that he got weaker in December. Pt's wife reports it seems like he lost interest in doing his exercises. Going on a week's vacation to the beach in April. Has been using his [facility] since his CVA. Wife reports that his posture has gotten worse. Does not use a cane around the house, but has to touch to walls/furniture for his balance. Has not had any falls. Pt's wife reports that he has a tendency to shuffle his feet. L leg feels weaker than it used to be.    Patient is accompained by: Family member   wife [name]  Pertinent History PMH: hypertension, CAD, type 2 diabetes, CVA (R thalamic), hyperlipidemia    Limitations Walking    Patient Stated Goals Wants to be like how he was - work on balance, being more concious of his posture.    Currently in Pain? No/denies                [facility] PT Assessment - 02/06/22 1034       Assessment   Medical Diagnosis Imbalance/gait abnormality   hx of R thalamic CVA   Referring Provider (PT) Pleas Koch, NP    Onset Date/Surgical Date 01/25/22    Hand Dominance Left    Prior Therapy OPPT at this location last year.      Precautions   Precautions Fall      Balance Screen   Has the patient fallen in the past 6 months No    Has the patient had a decrease in activity level because of a fear of falling?  No    Is the patient reluctant to leave their home  because of a fear of falling?  No      Home Ecologist residence    Living Arrangements Spouse/significant other    Available Help at Discharge Family    Type of Center to enter    Entrance Stairs-Number of Steps 2    [place] One level    [place] - 2 wheels;Cane - single point;Grab bars - tub/shower;Grab bars - toilet   doesn't use RW     Prior Function   Level of Independence Independent with community mobility with device    Leisure Loves to read      Sensation   Light Touch Appears Intact      Coordination   Gross Motor Movements are Fluid and Coordinated Yes      Posture/Postural Control   Posture/Postural Control Postural limitations    Postural Limitations Forward head;Rounded Shoulders;Flexed trunk      ROM / Strength   AROM / PROM / Strength Strength      Strength   Strength Assessment Site Hip;Knee;Ankle    Right/Left Hip Right;Left    Right Hip Flexion 4+/5    Left Hip Flexion 4+/5    Right/Left Knee Right;Left    Right Knee Flexion 4+/5    Right Knee Extension 4-/5    Left Knee Flexion 4+/5    Left Knee Extension 4-/5    Right/Left Ankle Right;Left    Right Ankle Dorsiflexion 5/5    Left Ankle Dorsiflexion 5/5      Transfers   Transfers Sit to Stand;Stand to Sit    Sit to Stand 5: Supervision    Five time sit to stand comments  22.09 seconds with no UE support from chair, incr forward flexed posture in standing.    Stand to Sit 5: Supervision      Ambulation/Gait   Ambulation/Gait Yes    Ambulation/Gait Assistance 5: Supervision    Assistive device None    Gait Pattern Poor foot clearance - left;Poor foot clearance - right;Trunk flexed;Decreased step length - right;Decreased step length - left;Decreased hip/knee flexion - right;Decreased hip/knee flexion - left;Decreased trunk rotation;Step-through pattern    Ambulation Surface Level;Indoor     Gait velocity 20 seconds = .5 m/s, or 1.64 ft/sec   with both SPC and no AD     Standardized Balance Assessment  Standardized Balance Assessment Timed Up and Go Test      Timed Up and Go Test   Normal TUG (seconds) 13.37   no AD     Functional Gait  Assessment   Gait assessed  Yes    Gait Level Surface Walks 20 ft, slow speed, abnormal gait pattern, evidence for imbalance or deviates 10-15 in outside of the 12 in walkway width. Requires more than 7 sec to ambulate 20 ft.   10.68 seconds   Change in Gait Speed Makes only minor adjustments to walking speed, or accomplishes a change in speed with significant gait deviations, deviates 10-15 in outside the 12 in walkway width, or changes speed but loses balance but is able to recover and continue walking.    Gait with Horizontal Head Turns Performs head turns smoothly with slight change in gait velocity (eg, minor disruption to smooth gait path), deviates 6-10 in outside 12 in walkway width, or uses an assistive device.    Gait with Vertical Head Turns Performs task with slight change in gait velocity (eg, minor disruption to smooth gait path), deviates 6 - 10 in outside 12 in walkway width or uses assistive device    Steps Alternating feet, must use rail.                        Objective measurements completed on examination: See above findings.                PT Education - 02/06/22 1139     Education Details Clinical findings (differences between eval with outcome measures compared to PT D/C last year 07/05/2021), POC, scheduling (pt unable to schedule with previous PT he worked with as he can't do wed/friday and those are his only tx days)    Person(s) Educated Patient;Spouse    Methods Explanation    Comprehension Verbalized understanding              PT Short Term Goals - 02/06/22 1241       PT SHORT TERM GOAL #1   Title Pt will be independent with initial HEP for strength/balance in order to build upon  functional gains made in therapy. ALL STGS DUE 03/06/22    Time 4    Period Weeks    Status New    Target Date 03/06/22      PT SHORT TERM GOAL #2   Title Pt will finish assessment of FGA with LTG written.    Baseline did not have time to finish at eval    Time 4    Period Weeks    Status New      PT SHORT TERM GOAL #3   Title Pt will improve gait speed with no AD vs. cane to at least 1.9 ft/sec in order to demo decr fall risk.    Baseline 1.64 ft/sec    Time 4    Period Weeks    Status New      PT SHORT TERM GOAL #4   Title Pt will improve 5x sit <> stand with no UE support to 19 seconds or less in order to demo decr fall risk/improved functional BLE strength.    Baseline 22.09 seconds    Time 4    Period Weeks    Status New               PT Long Term Goals - 02/06/22 1244       PT LONG TERM  GOAL #1   Title Pt will be independent with final HEP for strength/balance in order to build upon functional gains made in therapy. ALL LTGS DUE 08/07/22    Time 8    Period Weeks    Status New    Target Date 04/03/22      PT LONG TERM GOAL #2   Title FGA goal to be written as appropriate.    Time 8    Period Weeks    Status New      PT LONG TERM GOAL #3   Title Pt will improve 5x sit <> stand with no UE support to 16 seconds or less in order to demo decr fall risk/improved functional BLE strength.    Baseline 22.09 seconds    Time 8    Period Weeks    Status New      PT LONG TERM GOAL #4   Title Pt will improve gait speed with no AD vs. cane to at least 2.2 ft/sec in order to improve community mobility.    Baseline 1.64 ft/sec    Time 8    Period Weeks    Status New                    Plan - 02/06/22 1246     Clinical Impression Statement Patient is a 81 year old male referred to Neuro OPPT for imbalance/gait abnormalities. Pt with hx of R thalamic CVA (received PT here at this location last year in the spring/summer). Pt was diagnosed with COVID-19 in  Jan 2023 and reports a decr in strength/mobility since then.  Pt's PMH is significant for: hypertension, CAD, type 2 diabetes, CVA (R thalamic), hyperlipidemia .  The following deficits were present during the exam: postural abnormalities, impaired balance, gait abnormalities, decr strength, decr endurance. Based on gait speed and  5x sit <> stand pt is an incr risk for falls. Pt's with slower mobility measures since he was discharged in July of 2022. Did not get to finish FGA due to time constraints - will assess at next session.  Pt would benefit from skilled PT to address these impairments and functional limitations to maximize functional mobility independence and decr fall risk.    Personal Factors and Comorbidities Age;Time since onset of injury/illness/exacerbation;Past/Current Experience;Comorbidity 3+    Comorbidities hypertension, CAD, type 2 diabetes, CVA (R thalamic), hyperlipidemia    Examination-Activity Limitations Stairs;Squat;Transfers;Locomotion Level    Examination-Participation Restrictions Church;Community Activity;Driving;Shop;Yard Work;Cleaning    Stability/Clinical Decision Making Evolving/Moderate complexity    Rehab Potential Good    PT Frequency 2x / week    PT Duration 12 weeks    PT Treatment/Interventions Patient/family education;ADLs/Self Care Home Management;DME Instruction;Gait training;Stair training;Functional mobility training;Therapeutic exercise;Balance training;Therapeutic activities;Neuromuscular re-education;Vestibular    PT Next Visit Plan Finish performing FGA. Review prior HEP and update it as needed for strengthening/balance. Work on Scientist, forensic.    PT Home Exercise Plan Access Code [email or web address]    Consulted and Agree with Plan of Care Patient;Family member/caregiver    Family Member Consulted Wife             Patient will benefit from skilled therapeutic intervention in order to improve the following deficits and impairments:  Abnormal gait,  Decreased activity tolerance, Decreased balance, Decreased endurance, Decreased mobility, Difficulty walking, Decreased strength, Postural dysfunction, Impaired flexibility  Visit Diagnosis: Muscle weakness (generalized)  Other abnormalities of gait and mobility  Unsteadiness on feet  Abnormal posture     Problem List Patient  Active Problem List   Diagnosis Date Noted   History of CVA (cerebrovascular accident) 01/25/2022   History of COVID-19 01/08/2022   Carotid stenosis, asymptomatic, left 05/18/2021   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[place]) 04/06/2021   TIA (transient ischemic attack) 04/03/2021   Lower extremity edema 08/13/2019   Unsteady gait 06/09/2019   Encounter for well adult exam with abnormal findings 06/09/2019   Trigger ring finger of right hand 06/02/2018   Hyperlipidemia 01/30/2016   Carotid stenosis 08/26/2015   Anxiety, 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   Carotid artery disease ([place]) 07/01/2012   Special screening for malignant neoplasm of prostate 05/08/2011   Vitamin D deficiency 11/20/2009   Transient cerebral ischemia 06/30/2009   CEREBROVASCULAR DISEASE 06/30/2009   COLONIC POLYPS 01/10/2008   Essential hypertension 01/10/2008   Coronary atherosclerosis 01/10/2008   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 [place], [place], [address] Phone: [phone]   Fax:  [phone]  Name: [name] [name] MRN: [id number] Date of Birth: 1941/10/11

## 2022-02-08 ENCOUNTER — Ambulatory Visit: Payer: Medicare PPO

## 2022-02-08 ENCOUNTER — Other Ambulatory Visit: Payer: Self-pay

## 2022-02-08 DIAGNOSIS — M6281 Muscle weakness (generalized): Secondary | ICD-10-CM | POA: Diagnosis not present

## 2022-02-08 DIAGNOSIS — Z8673 Personal history of transient ischemic attack (TIA), and cerebral infarction without residual deficits: Secondary | ICD-10-CM | POA: Diagnosis not present

## 2022-02-08 DIAGNOSIS — R2681 Unsteadiness on feet: Secondary | ICD-10-CM | POA: Diagnosis not present

## 2022-02-08 DIAGNOSIS — R2689 Other abnormalities of gait and mobility: Secondary | ICD-10-CM

## 2022-02-08 DIAGNOSIS — R293 Abnormal posture: Secondary | ICD-10-CM | POA: Diagnosis not present

## 2022-02-08 NOTE — Therapy (Signed)
OUTPATIENT PHYSICAL THERAPY TREATMENT NOTE   Patient Name: [name] [name] MRN: [id number] DOB:Feb 02, 1941, 81 y.o., male Today's Date: 02/08/2022  PCP: Pleas Koch, NP REFERRING PROVIDER: Pleas Koch, NP   PT End of Session - 02/08/22 [phone]     Visit Number 2    Number of Visits 17    Date for PT Re-Evaluation 05/07/22    Authorization Type [employer] Medicare - will need auth    PT Start Time 0845    PT Stop Time 0930    PT Time Calculation (min) 45 min    Equipment Utilized During Treatment Gait belt    Activity Tolerance Patient tolerated treatment well    Behavior During Therapy [facility] for tasks assessed/performed             Past Medical History:  Diagnosis Date   Basal cell carcinoma 11/09/2019   nod & infil-behind right ear-cx3 &exc   Basal cell carcinoma 03/21/2020   Residual BCC with peripheral margin involved - ST recommends [facility]   Carotid artery occlusion    Coronary artery disease    s/p CABG 2005; sees Dr Johnsie Cancel yearly   Diabetes mellitus without complication ([place])    Gynecomastia    Hypercholesterolemia    Hypertension    Neurodermatitis    Overweight(278.02)    Personal history of colonic polyps 10/08/2006   tubular adenomas   Renal insufficiency    Stroke [facility])    TIA  April 2022   Transient ischemic attack 2010   "lasted ~ 5 seconds"   Vitamin D deficiency    Past Surgical History:  Procedure Laterality Date   CARDIAC CATHETERIZATION  02/2004   "tried to stent; couldn't"   CAROTID ENDARTERECTOMY Right 08/26/2015   COLONOSCOPY     CORONARY ANGIOPLASTY     CORONARY ARTERY BYPASS GRAFT  Feb. 2005   4 vessel   ENDARTERECTOMY Right 08/26/2015   Procedure: Right Carotid ENDARTERECTOMY with Patch Angioplasty ;  Surgeon: Rosetta Posner, MD;  Location: [place];  Service: Vascular;  Laterality: Right;   ENDARTERECTOMY Left 05/18/2021   Procedure: LEFT CAROTID ARTERY ENDARTERECTOMY  with patch angioplasty;  Surgeon: Rosetta Posner, MD;  Location: [place];  Service: Vascular;  Laterality: Left;   EYE SURGERY     bilateral cataract   KELOID EXCISION  04/2008   on chest scar; Dr. 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[place]) 04/06/2021   TIA (transient ischemic attack) 04/03/2021   Lower extremity edema 08/13/2019   Unsteady gait 06/09/2019   Encounter for well adult exam with abnormal findings 06/09/2019   Trigger ring finger of right hand 06/02/2018   Hyperlipidemia 01/30/2016   Carotid stenosis 08/26/2015   Anxiety, 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   Carotid artery disease ([place]) 07/01/2012   Special screening for malignant neoplasm of prostate 05/08/2011   Vitamin D deficiency 11/20/2009   Transient cerebral ischemia 06/30/2009   CEREBROVASCULAR DISEASE 06/30/2009   COLONIC POLYPS 01/10/2008   Essential hypertension 01/10/2008   Coronary atherosclerosis 01/10/2008   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 No falls.  PAIN:  Are you having pain? No  OBJECTIVE:  TODAY'S TREATMENT:  02/08/22: Assessed remainder of FGA Stairs: 5 x 4 steps, 20 steps with bil hand rail Sit to stand: 10x, 5x no HHA Gait training: 55' with CGA, with cues to take longer steps and arm swings   PATIENT  EDUCATION: Education details: Pt and wife educated on going to senior center and using their indoor laps to work on walking endurance Person educated: Patient and Spouse Education method: Explanation Education comprehension: verbalized understanding   HOME EXERCISE PROGRAM: [email or web address]   ASSESSMENT: Patient demonstrated mild deviation with functional gait assessment. Patient able to walk for about 300' before he demonstrated fatigue and started showing significant gait deviations with shuffling and decreased step length, and decreased bil ankle dorsiflexion.    PT Short Term Goals - 02/06/22 1241       PT SHORT TERM GOAL #1   Title Pt will be independent with initial HEP for strength/balance in order to build upon functional gains made in therapy. ALL STGS DUE 03/06/22    Time 4    Period Weeks    Status New    Target Date 03/06/22      PT SHORT TERM GOAL #2   Title Pt will finish assessment of FGA with LTG written.    Baseline did not have time to finish at eval    Time 4    Period Weeks    Status New      PT SHORT TERM GOAL #3   Title Pt will improve gait speed with no AD vs. cane to at least 1.9 ft/sec in order to demo decr fall risk.    Baseline 1.64 ft/sec    Time 4    Period Weeks    Status New      PT SHORT TERM GOAL #4   Title Pt will improve 5x sit <> stand with no UE support to 19 seconds or less in order to demo decr fall risk/improved functional BLE strength.    Baseline 22.09 seconds    Time 4    Period Weeks    Status New              PT Long Term Goals - 02/06/22 1244       PT LONG TERM GOAL #1   Title Pt will be independent with final HEP for strength/balance in order to build upon functional gains made in therapy. ALL LTGS DUE 08/07/22    Time 8    Period Weeks    Status New    Target Date 04/03/22      PT LONG TERM GOAL #2   Title Pt will demo >26/30 on FGA to improve functional mobility. Baseline: 23/30 (02/08/22)   Time 8    Period Weeks     Status New      PT LONG TERM GOAL #3   Title Pt will improve 5x sit <> stand with no UE support to 16 seconds or less in order to demo decr fall risk/improved functional BLE strength.    Baseline 22.09 seconds    Time 8    Period Weeks  Status New      PT LONG TERM GOAL #4   Title Pt will improve gait speed with no AD vs. cane to at least 2.2 ft/sec in order to improve community mobility.    Baseline 1.64 ft/sec    Time 8    Period Weeks    Status New              Plan - 02/08/22 0844     Personal Factors and Comorbidities Age;Time since onset of injury/illness/exacerbation;Past/Current Experience;Comorbidity 3+    Comorbidities hypertension, CAD, type 2 diabetes, CVA (R thalamic), hyperlipidemia    Examination-Activity Limitations Stairs;Squat;Transfers;Locomotion Level    Examination-Participation Restrictions Church;Community Activity;Driving;Shop;Yard Work;Cleaning    Stability/Clinical Decision Making Evolving/Moderate complexity    Rehab Potential Good    PT Frequency 2x / week    PT Duration 12 weeks    PT Treatment/Interventions Patient/family education;ADLs/Self Care Home Management;DME Instruction;Gait training;Stair training;Functional mobility training;Therapeutic exercise;Balance training;Therapeutic activities;Neuromuscular re-education;Vestibular    PT Next Visit Plan Continue to work on cardiovascular endurance, functional strength, and gait endurance.   PT Home Exercise Plan Access Code [email or web address]    Consulted and Agree with Plan of Care Patient;Family member/caregiver    Family Member Consulted Wife               [name], [place] 02/08/2022, 9:34 AM

## 2022-02-09 ENCOUNTER — Other Ambulatory Visit: Payer: Self-pay | Admitting: Primary Care

## 2022-02-09 DIAGNOSIS — E7849 Other hyperlipidemia: Secondary | ICD-10-CM

## 2022-02-09 NOTE — Telephone Encounter (Signed)
Can you verify his dose of atorvastatin? The last prescription was filled by Romilda Garret for atorvastatin 20 mg, 1/2 tablet daily, in August 2022 for 30 tabs.  Is he taking this dose, 10 mg?

## 2022-02-12 NOTE — Telephone Encounter (Signed)
Called patient he is getting 20 mg prescribed but only taking 1/2 tab daily does need refill.

## 2022-02-13 ENCOUNTER — Encounter: Payer: Self-pay | Admitting: Podiatry

## 2022-02-13 ENCOUNTER — Ambulatory Visit: Payer: Medicare PPO | Admitting: Podiatry

## 2022-02-13 ENCOUNTER — Ambulatory Visit: Payer: Medicare PPO

## 2022-02-13 ENCOUNTER — Other Ambulatory Visit: Payer: Self-pay

## 2022-02-13 DIAGNOSIS — R2681 Unsteadiness on feet: Secondary | ICD-10-CM | POA: Diagnosis not present

## 2022-02-13 DIAGNOSIS — D689 Coagulation defect, unspecified: Secondary | ICD-10-CM

## 2022-02-13 DIAGNOSIS — B351 Tinea unguium: Secondary | ICD-10-CM

## 2022-02-13 DIAGNOSIS — R293 Abnormal posture: Secondary | ICD-10-CM | POA: Diagnosis not present

## 2022-02-13 DIAGNOSIS — M79674 Pain in right toe(s): Secondary | ICD-10-CM

## 2022-02-13 DIAGNOSIS — M6281 Muscle weakness (generalized): Secondary | ICD-10-CM | POA: Diagnosis not present

## 2022-02-13 DIAGNOSIS — M79675 Pain in left toe(s): Secondary | ICD-10-CM | POA: Diagnosis not present

## 2022-02-13 DIAGNOSIS — R2689 Other abnormalities of gait and mobility: Secondary | ICD-10-CM

## 2022-02-13 DIAGNOSIS — Z8673 Personal history of transient ischemic attack (TIA), and cerebral infarction without residual deficits: Secondary | ICD-10-CM | POA: Diagnosis not present

## 2022-02-13 NOTE — Therapy (Signed)
OUTPATIENT PHYSICAL THERAPY TREATMENT NOTE   Patient Name: [name] [name] MRN: [id number] DOB:May 25, 1941, 81 y.o., male Today's Date: 02/13/2022  PCP: Pleas Koch, NP REFERRING PROVIDER: Pleas Koch, NP   PT End of Session - 02/13/22 1026     Visit Number 3    Number of Visits 17    Date for PT Re-Evaluation 05/07/22    Authorization Type [employer] - will need auth    Equipment Utilized During Treatment Gait belt    Activity Tolerance Patient tolerated treatment well    Behavior During Therapy [facility] for tasks assessed/performed             Past Medical History:  Diagnosis Date   Basal cell carcinoma 11/09/2019   nod & infil-behind right ear-cx3 &exc   Basal cell carcinoma 03/21/2020   Residual BCC with peripheral margin involved - ST recommends [facility]   Carotid artery occlusion    Coronary artery disease    s/p CABG 2005; sees Dr Johnsie Cancel yearly   Diabetes mellitus without complication ([place])    Gynecomastia    Hypercholesterolemia    Hypertension    Neurodermatitis    Overweight(278.02)    Personal history of colonic polyps 10/08/2006   tubular adenomas   Renal insufficiency    Stroke [facility])    TIA  April 2022   Transient ischemic attack 2010   "lasted ~ 5 seconds"   Vitamin D deficiency    Past Surgical History:  Procedure Laterality Date   CARDIAC CATHETERIZATION  02/2004   "tried to stent; couldn't"   CAROTID ENDARTERECTOMY Right 08/26/2015   COLONOSCOPY     CORONARY ANGIOPLASTY     CORONARY ARTERY BYPASS GRAFT  Feb. 2005   4 vessel   ENDARTERECTOMY Right 08/26/2015   Procedure: Right Carotid ENDARTERECTOMY with Patch Angioplasty ;  Surgeon: Rosetta Posner, MD;  Location: [place];  Service: Vascular;  Laterality: Right;   ENDARTERECTOMY Left 05/18/2021   Procedure: LEFT CAROTID ARTERY ENDARTERECTOMY  with patch angioplasty;  Surgeon: Rosetta Posner, MD;  Location: [place];  Service: Vascular;  Laterality: Left;   EYE SURGERY     bilateral  cataract   KELOID EXCISION  04/2008   on chest scar; Dr. 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   Carotid artery disease ([place]) 07/01/2012   Special screening for malignant neoplasm of prostate 05/08/2011   Vitamin D deficiency 11/20/2009   Transient cerebral ischemia 06/30/2009   CEREBROVASCULAR DISEASE 06/30/2009   COLONIC POLYPS 01/10/2008   Essential hypertension 01/10/2008   Coronary atherosclerosis 01/10/2008   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   GYNECOMASTIA, UNILATERAL 01/10/2008   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   SHOULDER PAIN 01/10/2008   Type 2 diabetes mellitus ([place]) 01/10/2008    REFERRING DIAG:  R26.81 (ICD-10-CM) - Unsteady gait  Z86.73 (ICD-10-CM) - History of CVA (cerebrovascular accident)    THERAPY DIAG:  Muscle weakness (generalized)  Other abnormalities of gait and mobility  Unsteadiness on feet  PERTINENT HISTORY:  Previous hx of CVA   PRECAUTIONS: falls  SUBJECTIVE: Pt reports he has only tried exercises once since last session as they had wedding to go to.  PAIN:  Are you having pain? No  OBJECTIVE:  TODAY'S TREATMENT:  02/13/22: Sit to stand: 10lbs 10x, required min A for last 5 reps Kicking 7lb ball around the clinic 115' with alternating feet Taking large steps walking fwd: 40 feet with leading with R and 40 feet leading with L LE Walking on toes:  20 feet x 2 Walking on heels: 20 feet x 2  Standing with narrow BOS: holding 3 kg ball: trunk twists: 10x R and L Standing with narrow BOS: holding 3 kg ball: OH reach with tracking ball: 10x  Resisted walking: 2 x 80 feet with black sport cord Resisted walking: 2 x 40 feet with black sport cord, with cues for increased gait speed  02/08/22: Assessed remainder of FGA Stairs: 5 x 4 steps, 20 steps with bil hand rail Sit to stand: 10x, 5x no HHA Gait training: 65' with CGA, with cues to take longer steps and arm swings   PATIENT EDUCATION: Education details: Pt and wife educated on going to senior center and using their indoor laps to work on walking endurance Person educated: Patient and Spouse Education method: Explanation Education comprehension: verbalized understanding   HOME EXERCISE PROGRAM: [email or web address]   ASSESSMENT: Patient tolerated session well. Pt demo increased difficulty with sit to stand while holding 10lb weight in hands and without HHA. Pt had increased difficulty with walking on heels.   PT Short Term Goals - 02/06/22 1241       PT SHORT TERM GOAL #1   Title Pt will be independent with initial HEP for strength/balance in order to build upon functional gains made in therapy. ALL STGS DUE 03/06/22    Time 4    Period Weeks    Status New    Target Date 03/06/22      PT SHORT TERM GOAL #2   Title Pt will finish assessment of FGA with LTG written.    Baseline did not have time to finish at eval    Time 4    Period Weeks    Status New      PT SHORT TERM GOAL #3   Title Pt will improve gait speed with no AD vs. cane to at least 1.9 ft/sec in order to demo decr fall risk.    Baseline 1.64 ft/sec    Time 4    Period Weeks    Status New      PT SHORT TERM GOAL #4   Title Pt will improve 5x sit <> stand with no UE support to 19 seconds or less in order to demo decr fall risk/improved functional BLE strength.    Baseline 22.09 seconds    Time 4    Period Weeks     Status New              PT Long Term Goals - 02/06/22 1244       PT LONG TERM GOAL #1   Title Pt will be independent with final HEP for strength/balance in order to build upon functional gains made in therapy. ALL LTGS DUE 08/07/22    Time 8    Period Weeks    Status New    Target Date 04/03/22      PT LONG TERM  GOAL #2   Title Pt will demo >26/30 on FGA to improve functional mobility. Baseline: 23/30 (02/08/22)   Time 8    Period Weeks    Status New      PT LONG TERM GOAL #3   Title Pt will improve 5x sit <> stand with no UE support to 16 seconds or less in order to demo decr fall risk/improved functional BLE strength.    Baseline 22.09 seconds    Time 8    Period Weeks    Status New      PT LONG TERM GOAL #4   Title Pt will improve gait speed with no AD vs. cane to at least 2.2 ft/sec in order to improve community mobility.    Baseline 1.64 ft/sec    Time 8    Period Weeks    Status New              Plan - 02/08/22 0844     Personal Factors and Comorbidities Age;Time since onset of injury/illness/exacerbation;Past/Current Experience;Comorbidity 3+    Comorbidities hypertension, CAD, type 2 diabetes, CVA (R thalamic), hyperlipidemia    Examination-Activity Limitations Stairs;Squat;Transfers;Locomotion Level    Examination-Participation Restrictions Church;Community Activity;Driving;Shop;Yard Work;Cleaning    Stability/Clinical Decision Making Evolving/Moderate complexity    Rehab Potential Good    PT Frequency 2x / week    PT Duration 12 weeks    PT Treatment/Interventions Patient/family education;ADLs/Self Care Home Management;DME Instruction;Gait training;Stair training;Functional mobility training;Therapeutic exercise;Balance training;Therapeutic activities;Neuromuscular re-education;Vestibular    PT Next Visit Plan Continue to work on cardiovascular endurance, functional strength, and gait endurance.   PT Home Exercise Plan Access Code [email or web address]    Consulted  and Agree with Plan of Care Patient;Family member/caregiver    Family Member Consulted Wife               [name], [place] 02/13/2022, 10:36 AM

## 2022-02-13 NOTE — Telephone Encounter (Signed)
Noted. Refill(s) sent to pharmacy.  

## 2022-02-13 NOTE — Progress Notes (Signed)
This patient presents  to my office for at risk foot care.  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  This patient  takes plavix.This patient is unable to cut nails himself since the patient cannot reach his nails.These nails are painful walking and wearing shoes.  This patient presents for at risk foot care today.  General Appearance  Alert, conversant and in no acute stress.  Vascular  Dorsalis pedis and posterior tibial  pulses are weakly  palpable  bilaterally.  Capillary return is within normal limits  bilaterally. Temperature is within normal limits  bilaterally. Swelling persists in feet.   Neurologic  Senn-Weinstein monofilament wire test within normal limits  bilaterally. Muscle power within normal limits bilaterally.  Nails Thick disfigured discolored nails with subungual debris  from hallux to fifth toes bilaterally. No evidence of bacterial infection or drainage bilaterally.  Orthopedic  No limitations of motion  feet .  No crepitus or effusions noted.  No bony pathology or digital deformities noted.  Skin  normotropic skin with no porokeratosis noted bilaterally.  No signs of infections or ulcers noted.     Onychomycosis  Pain in right toes  Pain in left toes  Consent was obtained for treatment procedures.  IE.  Mechanical debridement of nails 1-5  bilaterally performed with a nail nipper.  Filed with dremel without incident.    Return office visit    3 months                  Told patient to return for periodic foot care and evaluation due to potential at risk complications.   Gardiner Barefoot DPM

## 2022-02-15 ENCOUNTER — Ambulatory Visit: Payer: Medicare PPO

## 2022-02-15 ENCOUNTER — Other Ambulatory Visit: Payer: Self-pay

## 2022-02-15 DIAGNOSIS — M6281 Muscle weakness (generalized): Secondary | ICD-10-CM

## 2022-02-15 DIAGNOSIS — Z8673 Personal history of transient ischemic attack (TIA), and cerebral infarction without residual deficits: Secondary | ICD-10-CM | POA: Diagnosis not present

## 2022-02-15 DIAGNOSIS — R2689 Other abnormalities of gait and mobility: Secondary | ICD-10-CM | POA: Diagnosis not present

## 2022-02-15 DIAGNOSIS — R2681 Unsteadiness on feet: Secondary | ICD-10-CM | POA: Diagnosis not present

## 2022-02-15 DIAGNOSIS — R293 Abnormal posture: Secondary | ICD-10-CM | POA: Diagnosis not present

## 2022-02-15 NOTE — Therapy (Signed)
OUTPATIENT PHYSICAL THERAPY TREATMENT NOTE   Patient Name: [name] [name] MRN: [id number] DOB:[birth year], 81 y.o., male Today's Date: 02/15/2022  PCP: Pleas Koch, NP REFERRING PROVIDER: Pleas Koch, NP   PT End of Session - 02/15/22 1251     Visit Number 4    Number of Visits 17    Date for PT Re-Evaluation 05/07/22    Authorization Type [employer] - will need auth    PT Start Time 1245    PT Stop Time 1330    PT Time Calculation (min) 45 min    Equipment Utilized During Treatment Gait belt    Activity Tolerance Patient tolerated treatment well    Behavior During Therapy [facility] for tasks assessed/performed             Past Medical History:  Diagnosis Date   Basal cell carcinoma 11/09/2019   nod & infil-behind right ear-cx3 &exc   Basal cell carcinoma 03/21/2020   Residual BCC with peripheral margin involved - ST recommends [facility]   Carotid artery occlusion    Coronary artery disease    s/p CABG 2005; sees Dr Johnsie Cancel yearly   Diabetes mellitus without complication ([place])    Gynecomastia    Hypercholesterolemia    Hypertension    Neurodermatitis    Overweight(278.02)    Personal history of colonic polyps 10/08/2006   tubular adenomas   Renal insufficiency    Stroke [facility])    TIA  April 2022   Transient ischemic attack 2010   "lasted ~ 5 seconds"   Vitamin D deficiency    Past Surgical History:  Procedure Laterality Date   CARDIAC CATHETERIZATION  02/2004   "tried to stent; couldn't"   CAROTID ENDARTERECTOMY Right 08/26/2015   COLONOSCOPY     CORONARY ANGIOPLASTY     CORONARY ARTERY BYPASS GRAFT  Feb. 2005   4 vessel   ENDARTERECTOMY Right 08/26/2015   Procedure: Right Carotid ENDARTERECTOMY with Patch Angioplasty ;  Surgeon: Rosetta Posner, MD;  Location: [place];  Service: Vascular;  Laterality: Right;   ENDARTERECTOMY Left 05/18/2021   Procedure: LEFT CAROTID ARTERY ENDARTERECTOMY  with patch angioplasty;  Surgeon: Rosetta Posner, MD;  Location:  [facility] OR;  Service: Vascular;  Laterality: Left;   EYE SURGERY     bilateral cataract   KELOID EXCISION  04/2008   on chest scar; Dr. 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   TIA (transient ischemic attack) 04/03/2021   Lower extremity edema 08/13/2019   Unsteady gait 06/09/2019   Encounter for well adult exam with abnormal findings 06/09/2019   Trigger ring finger of right hand 06/02/2018   Hyperlipidemia 01/30/2016   Carotid stenosis 08/26/2015   Anxiety, 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   Carotid artery disease ([place]) 07/01/2012   Special screening for malignant neoplasm of prostate 05/08/2011   Vitamin D deficiency 11/20/2009   Transient cerebral ischemia 06/30/2009   CEREBROVASCULAR DISEASE 06/30/2009   COLONIC POLYPS 01/10/2008   Essential hypertension 01/10/2008   Coronary atherosclerosis 01/10/2008   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   GYNECOMASTIA, UNILATERAL 01/10/2008   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  SHOULDER PAIN 01/10/2008   Type 2 diabetes mellitus ([place]) 01/10/2008    REFERRING DIAG:  R26.81 (ICD-10-CM) - Unsteady gait  Z86.73 (ICD-10-CM) - History of CVA (cerebrovascular accident)    THERAPY DIAG:  Muscle weakness (generalized)  Other abnormalities of gait and mobility  Unsteadiness on feet  PERTINENT HISTORY:  Previous hx of CVA   PRECAUTIONS: falls  SUBJECTIVE: I am unsteady today because I didn't get a good night sleep last night.  PAIN:  Are you having pain? No  OBJECTIVE:  TODAY'S TREATMENT:  02/15/22: Gait training: 1 x 230' with intermittent fast and normal  walking speed, cues for arm swings Walking fwd with EC: 2 x 20' Walking bwd with EO: 2 x 20' Walking with high knee marches with pt cued to touch contralateral hand to knee: 2 x 20 feet Stepping fwd, neutral and bwd: stepping over 1/2" pipe: 10x R and L, min A required for 2 LOB Stepping laterally over 1/2" pipe alternating feet with each rep: 10x R and L Sit to stand: 10 lb KB: 10x Heel walking and toe walking: 2 x 20 feet Turning 360 deg in 2 x 2 square: 3x cw, 3x ccw Punching bag in standing: PT randomly telsl patient number and pt to hit numbers on punching bag. Also performed where pt has to take a step forward and then perform puching for partial tandem balance perturbation: spent about 8'    02/13/22: Sit to stand: 10lbs 10x, required min A for last 5 reps Kicking 7lb ball around the clinic 115' with alternating feet Taking large steps walking fwd: 40 feet with leading with R and 40 feet leading with L LE Walking on toes: 20 feet x 2 Walking on heels: 20 feet x 2  Standing with narrow BOS: holding 3 kg ball: trunk twists: 10x R and L Standing with narrow BOS: holding 3 kg ball: OH reach with tracking ball: 10x  Resisted walking: 2 x 80 feet with black sport cord Resisted walking: 2 x 40 feet with black sport cord, with cues for increased gait speed  02/08/22: Assessed remainder of FGA Stairs: 5 x 4 steps, 20 steps with bil hand rail Sit to stand: 10x, 5x no HHA Gait training: 24' with CGA, with cues to take longer steps and arm swings   PATIENT EDUCATION: Education details: Pt and wife educated on going to senior center and using their indoor laps to work on walking endurance Person educated: Patient and Spouse Education method: Explanation Education comprehension: verbalized understanding   HOME EXERCISE PROGRAM: [email or web address]   ASSESSMENT: Patient demo decreased gait spped and unsteadiness with ambulation due to fatigue. Patient and wife were educated on discussing  sleeping habbits with PCP to see if there is something they can provide to help him sleep better as his function is significantly affected when he sleeps better at night. Pt demo decreased balance on R LE with balance activities and needed min A for LOB 2-3 times during the session.   PT Short Term Goals - 02/06/22 1241       PT SHORT TERM GOAL #1   Title Pt will be independent with initial HEP for strength/balance in order to build upon functional gains made in therapy. ALL STGS DUE 03/06/22    Time 4    Period Weeks    Status New    Target Date 03/06/22      PT SHORT TERM GOAL #2   Title Pt will finish assessment of FGA with LTG  written.    Baseline did not have time to finish at eval    Time 4    Period Weeks    Status New      PT SHORT TERM GOAL #3   Title Pt will improve gait speed with no AD vs. cane to at least 1.9 ft/sec in order to demo decr fall risk.    Baseline 1.64 ft/sec    Time 4    Period Weeks    Status New      PT SHORT TERM GOAL #4   Title Pt will improve 5x sit <> stand with no UE support to 19 seconds or less in order to demo decr fall risk/improved functional BLE strength.    Baseline 22.09 seconds    Time 4    Period Weeks    Status New              PT Long Term Goals - 02/06/22 1244       PT LONG TERM GOAL #1   Title Pt will be independent with final HEP for strength/balance in order to build upon functional gains made in therapy. ALL LTGS DUE 08/07/22    Time 8    Period Weeks    Status New    Target Date 04/03/22      PT LONG TERM GOAL #2   Title Pt will demo >26/30 on FGA to improve functional mobility. Baseline: 23/30 (02/08/22)   Time 8    Period Weeks    Status New      PT LONG TERM GOAL #3   Title Pt will improve 5x sit <> stand with no UE support to 16 seconds or less in order to demo decr fall risk/improved functional BLE strength.    Baseline 22.09 seconds    Time 8    Period Weeks    Status New      PT LONG TERM GOAL #4    Title Pt will improve gait speed with no AD vs. cane to at least 2.2 ft/sec in order to improve community mobility.    Baseline 1.64 ft/sec    Time 8    Period Weeks    Status New              Plan - 02/08/22 0844     Personal Factors and Comorbidities Age;Time since onset of injury/illness/exacerbation;Past/Current Experience;Comorbidity 3+    Comorbidities hypertension, CAD, type 2 diabetes, CVA (R thalamic), hyperlipidemia    Examination-Activity Limitations Stairs;Squat;Transfers;Locomotion Level    Examination-Participation Restrictions Church;Community Activity;Driving;Shop;Yard Work;Cleaning    Stability/Clinical Decision Making Evolving/Moderate complexity    Rehab Potential Good    PT Frequency 2x / week    PT Duration 12 weeks    PT Treatment/Interventions Patient/family education;ADLs/Self Care Home Management;DME Instruction;Gait training;Stair training;Functional mobility training;Therapeutic exercise;Balance training;Therapeutic activities;Neuromuscular re-education;Vestibular    PT Next Visit Plan Continue to work on cardiovascular endurance, functional strength, and gait endurance.   PT Home Exercise Plan Access Code [email or web address]    Consulted and Agree with Plan of Care Patient;Family member/caregiver    Family Member Consulted Wife               [name], [place] 02/15/2022, 12:52 PM

## 2022-02-20 ENCOUNTER — Ambulatory Visit: Payer: Medicare PPO

## 2022-02-20 ENCOUNTER — Other Ambulatory Visit: Payer: Self-pay

## 2022-02-20 DIAGNOSIS — M6281 Muscle weakness (generalized): Secondary | ICD-10-CM

## 2022-02-20 DIAGNOSIS — R293 Abnormal posture: Secondary | ICD-10-CM | POA: Diagnosis not present

## 2022-02-20 DIAGNOSIS — R2681 Unsteadiness on feet: Secondary | ICD-10-CM

## 2022-02-20 DIAGNOSIS — R2689 Other abnormalities of gait and mobility: Secondary | ICD-10-CM | POA: Diagnosis not present

## 2022-02-20 DIAGNOSIS — Z8673 Personal history of transient ischemic attack (TIA), and cerebral infarction without residual deficits: Secondary | ICD-10-CM | POA: Diagnosis not present

## 2022-02-20 NOTE — Therapy (Signed)
OUTPATIENT PHYSICAL THERAPY TREATMENT NOTE   Patient Name: [name] [name] MRN: [id number] DOB:02-15-1941, 81 y.o., male Today's Date: 02/20/2022  PCP: Pleas Koch, NP REFERRING PROVIDER: Pleas Koch, NP   PT End of Session - 02/20/22 1021     Visit Number 5    Number of Visits 17    Date for PT Re-Evaluation 05/07/22    Authorization Type [employer] - will need auth    PT Start Time 1015    PT Stop Time 1100    PT Time Calculation (min) 45 min    Equipment Utilized During Treatment Gait belt    Activity Tolerance Patient tolerated treatment well    Behavior During Therapy [facility] for tasks assessed/performed             Past Medical History:  Diagnosis Date   Basal cell carcinoma 11/09/2019   nod & infil-behind right ear-cx3 &exc   Basal cell carcinoma 03/21/2020   Residual BCC with peripheral margin involved - ST recommends [facility]   Carotid artery occlusion    Coronary artery disease    s/p CABG 2005; sees Dr Johnsie Cancel yearly   Diabetes mellitus without complication ([place])    Gynecomastia    Hypercholesterolemia    Hypertension    Neurodermatitis    Overweight(278.02)    Personal history of colonic polyps 10/08/2006   tubular adenomas   Renal insufficiency    Stroke [facility])    TIA  April 2022   Transient ischemic attack 2010   "lasted ~ 5 seconds"   Vitamin D deficiency    Past Surgical History:  Procedure Laterality Date   CARDIAC CATHETERIZATION  02/2004   "tried to stent; couldn't"   CAROTID ENDARTERECTOMY Right 08/26/2015   COLONOSCOPY     CORONARY ANGIOPLASTY     CORONARY ARTERY BYPASS GRAFT  Feb. 2005   4 vessel   ENDARTERECTOMY Right 08/26/2015   Procedure: Right Carotid ENDARTERECTOMY with Patch Angioplasty ;  Surgeon: Rosetta Posner, MD;  Location: [place];  Service: Vascular;  Laterality: Right;   ENDARTERECTOMY Left 05/18/2021   Procedure: LEFT CAROTID ARTERY ENDARTERECTOMY  with patch angioplasty;  Surgeon: Rosetta Posner, MD;  Location:  [facility] OR;  Service: Vascular;  Laterality: Left;   EYE SURGERY     bilateral cataract   KELOID EXCISION  04/2008   on chest scar; Dr. 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   TIA (transient ischemic attack) 04/03/2021   Lower extremity edema 08/13/2019   Unsteady gait 06/09/2019   Encounter for well adult exam with abnormal findings 06/09/2019   Trigger ring finger of right hand 06/02/2018   Hyperlipidemia 01/30/2016   Carotid stenosis 08/26/2015   Anxiety, 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   Carotid artery disease ([place]) 07/01/2012   Special screening for malignant neoplasm of prostate 05/08/2011   Vitamin D deficiency 11/20/2009   Transient cerebral ischemia 06/30/2009   CEREBROVASCULAR DISEASE 06/30/2009   COLONIC POLYPS 01/10/2008   Essential hypertension 01/10/2008   Coronary atherosclerosis 01/10/2008   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   GYNECOMASTIA, UNILATERAL 01/10/2008   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  SHOULDER PAIN 01/10/2008   Type 2 diabetes mellitus ([place]) 01/10/2008    REFERRING DIAG:  R26.81 (ICD-10-CM) - Unsteady gait  Z86.73 (ICD-10-CM) - History of CVA (cerebrovascular accident)    THERAPY DIAG:  Muscle weakness (generalized)  Other abnormalities of gait and mobility  Unsteadiness on feet  PERTINENT HISTORY:  Previous hx of CVA   PRECAUTIONS: falls  SUBJECTIVE: I am doing okay. Last night didn't sleep well.  PAIN:  Are you having pain? No  OBJECTIVE:  TODAY'S TREATMENT:  02/20/22:  Gait speed: 2.62 ft/s (0.39ms) Gait training: 2 x 20 feet with focusing on striking  with heel during initial contact Gait training: 2 x 20 feet with cues to walk closer to his normal cadence, practiced how slow walking can affect his balance 5x sit to stand: pt unable to complete 5 reps without HHA, pt could only do about 3. Sit to stand with 1 HHA: 10x (L UE support) Gait training: Resisted waking with black sport cord, 4 x 60 feet, cues for increased cadence, heel to toe walk - bwd walking with sport cord: 4 x 20 feet, cues for longer step length Standing heel raises and toe raises without HHA: 10x each   02/15/22: Gait training: 1 x 230' with intermittent fast and normal walking speed, cues for arm swings Walking fwd with EC: 2 x 20' Walking bwd with EO: 2 x 20' Walking with high knee marches with pt cued to touch contralateral hand to knee: 2 x 20 feet Stepping fwd, neutral and bwd: stepping over 1/2" pipe: 10x R and L, min A required for 2 LOB Stepping laterally over 1/2" pipe alternating feet with each rep: 10x R and L Sit to stand: 10 lb KB: 10x Heel walking and toe walking: 2 x 20 feet Turning 360 deg in 2 x 2 square: 3x cw, 3x ccw Punching bag in standing: PT randomly telsl patient number and pt to hit numbers on punching bag. Also performed where pt has to take a step forward and then perform puching for partial tandem balance perturbation: spent about 8'    02/13/22: Sit to stand: 10lbs 10x, required min A for last 5 reps Kicking 7lb ball around the clinic 115' with alternating feet Taking large steps walking fwd: 40 feet with leading with R and 40 feet leading with L LE Walking on toes: 20 feet x 2 Walking on heels: 20 feet x 2  Standing with narrow BOS: holding 3 kg ball: trunk twists: 10x R and L Standing with narrow BOS: holding 3 kg ball: OH reach with tracking ball: 10x  Resisted walking: 2 x 80 feet with black sport cord Resisted walking: 2 x 40 feet with black sport cord, with cues for increased gait speed  02/08/22: Assessed remainder of  FGA Stairs: 5 x 4 steps, 20 steps with bil hand rail Sit to stand: 10x, 5x no HHA Gait training: 579 with CGA, with cues to take longer steps and arm swings   PATIENT EDUCATION: Education details: Pt and wife educated on going to senior center and using their indoor laps to work on walking endurance Person educated: Patient and Spouse Education method: Explanation Education comprehension: verbalized understanding   HOME EXERCISE PROGRAM: [email or web address]   ASSESSMENT: Pt met STG#3 today. Pt educated on importance of heel to toe walking to improve foot clearance with gait as patient tends to have flat foot contact due to decreased gait awareness. [facility] but with more improved awareness,  pt able to improve foot clearance. Pt requires reminders for improved gait awareness.    PT Short Term Goals - 02/06/22 1241       PT SHORT TERM GOAL #1   Title Pt will be independent with initial HEP for strength/balance in order to build upon functional gains made in therapy. ALL STGS DUE 03/06/22    Time 4    Period Weeks    Status On going 02/20/22   Target Date 03/06/22      PT SHORT TERM GOAL #2   Title Pt will finish assessment of FGA with LTG written.    Baseline did not have time to finish at eval    Time 4    Period Weeks    Status On going 02/08/22(23/30)     PT SHORT TERM GOAL #3   Title Pt will improve gait speed with no AD vs. cane to at least 1.9 ft/sec in order to demo decr fall risk.    Baseline 2.80ft/s (02/20/22)   Time 4    Period Weeks    Status achieved     PT SHORT TERM GOAL #4   Title Pt will improve 5x sit <> stand with no UE support to 19 seconds or less in order to demo decr fall risk/improved functional BLE strength.    Baseline 22.09 seconds    Time 4    Period Weeks    Status On going             PT Long Term Goals - 02/06/22 1244       PT LONG TERM GOAL #1   Title Pt will be independent with final HEP for strength/balance in  order to build upon functional gains made in therapy. ALL LTGS DUE 08/07/22    Time 8    Period Weeks    Status New    Target Date 04/03/22      PT LONG TERM GOAL #2   Title Pt will demo >26/30 on FGA to improve functional mobility. Baseline: 23/30 (02/08/22)   Time 8    Period Weeks    Status New      PT LONG TERM GOAL #3   Title Pt will improve 5x sit <> stand with no UE support to 16 seconds or less in order to demo decr fall risk/improved functional BLE strength.    Baseline 22.09 seconds    Time 8    Period Weeks    Status New      PT LONG TERM GOAL #4   Title Pt will improve gait speed with no AD vs. cane to at least 2.2 ft/sec in order to improve community mobility.    Baseline 1.64 ft/sec    Time 8    Period Weeks    Status New              Plan - 02/08/22 0844     Personal Factors and Comorbidities Age;Time since onset of injury/illness/exacerbation;Past/Current Experience;Comorbidity 3+    Comorbidities hypertension, CAD, type 2 diabetes, CVA (R thalamic), hyperlipidemia    Examination-Activity Limitations Stairs;Squat;Transfers;Locomotion Level    Examination-Participation Restrictions Church;Community Activity;Driving;Shop;Yard Work;Cleaning    Stability/Clinical Decision Making Evolving/Moderate complexity    Rehab Potential Good    PT Frequency 2x / week    PT Duration 12 weeks    PT Treatment/Interventions Patient/family education;ADLs/Self Care Home Management;DME Instruction;Gait training;Stair training;Functional mobility training;Therapeutic exercise;Balance training;Therapeutic activities;Neuromuscular re-education;Vestibular    PT Next Visit Plan Continue to work on cardiovascular  endurance, functional strength, and gait endurance.   PT Home Exercise Plan Access Code [email or web address]    Consulted and Agree with Plan of Care Patient;Family member/caregiver    Family Member Consulted Wife               [name], [place] 02/20/2022, 12:04 PM

## 2022-02-22 ENCOUNTER — Other Ambulatory Visit: Payer: Self-pay

## 2022-02-22 ENCOUNTER — Ambulatory Visit: Payer: Medicare PPO

## 2022-02-22 DIAGNOSIS — Z8673 Personal history of transient ischemic attack (TIA), and cerebral infarction without residual deficits: Secondary | ICD-10-CM | POA: Diagnosis not present

## 2022-02-22 DIAGNOSIS — R2681 Unsteadiness on feet: Secondary | ICD-10-CM

## 2022-02-22 DIAGNOSIS — M6281 Muscle weakness (generalized): Secondary | ICD-10-CM

## 2022-02-22 DIAGNOSIS — R2689 Other abnormalities of gait and mobility: Secondary | ICD-10-CM | POA: Diagnosis not present

## 2022-02-22 DIAGNOSIS — R293 Abnormal posture: Secondary | ICD-10-CM | POA: Diagnosis not present

## 2022-02-22 NOTE — Therapy (Addendum)
OUTPATIENT PHYSICAL THERAPY TREATMENT NOTE   Patient Name: [name] [name] MRN: [id number] DOB:19-Jan-1941, 81 y.o., male Today's Date: 02/22/2022  PCP: Pleas Koch, NP REFERRING PROVIDER: Pleas Koch, NP   PT End of Session - 02/22/22 1017     Visit Number 6    Number of Visits 17    Date for PT Re-Evaluation 05/07/22    Authorization Type [employer] - will need auth    PT Start Time 1015    PT Stop Time 1100    PT Time Calculation (min) 45 min    Equipment Utilized During Treatment Gait belt    Activity Tolerance Patient tolerated treatment well    Behavior During Therapy [facility] for tasks assessed/performed             Past Medical History:  Diagnosis Date   Basal cell carcinoma 11/09/2019   nod & infil-behind right ear-cx3 &exc   Basal cell carcinoma 03/21/2020   Residual BCC with peripheral margin involved - ST [facility]   Carotid artery occlusion    Coronary artery disease    s/p CABG 2005; sees Dr Johnsie Cancel yearly   Diabetes mellitus without complication ([place])    Gynecomastia    Hypercholesterolemia    Hypertension    Neurodermatitis    Overweight(278.02)    Personal history of colonic polyps 10/08/2006   tubular adenomas   Renal insufficiency    Stroke [facility])    TIA  April 2022   Transient ischemic attack 2010   "lasted ~ 5 seconds"   Vitamin D deficiency    Past Surgical History:  Procedure Laterality Date   CARDIAC CATHETERIZATION  02/2004   "tried to stent; couldn't"   CAROTID ENDARTERECTOMY Right 08/26/2015   COLONOSCOPY     CORONARY ANGIOPLASTY     CORONARY ARTERY BYPASS GRAFT  Feb. 2005   4 vessel   ENDARTERECTOMY Right 08/26/2015   Procedure: Right Carotid ENDARTERECTOMY with Patch Angioplasty ;  Surgeon: Rosetta Posner, MD;  Location: [place];  Service: Vascular;  Laterality: Right;   ENDARTERECTOMY Left 05/18/2021   Procedure: LEFT CAROTID ARTERY ENDARTERECTOMY  with patch angioplasty;  Surgeon: Rosetta Posner, MD;  Location:  [facility] OR;  Service: Vascular;  Laterality: Left;   EYE SURGERY     bilateral cataract   KELOID EXCISION  04/2008   on chest scar; Dr. 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   TIA (transient ischemic attack) 04/03/2021   Lower extremity edema 08/13/2019   Unsteady gait 06/09/2019   Encounter for well adult exam with abnormal findings 06/09/2019   Trigger ring finger of right hand 06/02/2018   Hyperlipidemia 01/30/2016   Carotid stenosis 08/26/2015   Anxiety, 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   Carotid artery disease ([place]) 07/01/2012   Special screening for malignant neoplasm of prostate 05/08/2011   Vitamin D deficiency 11/20/2009   Transient cerebral ischemia 06/30/2009   CEREBROVASCULAR DISEASE 06/30/2009   COLONIC POLYPS 01/10/2008   Essential hypertension 01/10/2008   Coronary atherosclerosis 01/10/2008   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   GYNECOMASTIA, UNILATERAL 01/10/2008   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  SHOULDER PAIN 01/10/2008   Type 2 diabetes mellitus ([place]) 01/10/2008    REFERRING DIAG:  R26.81 (ICD-10-CM) - Unsteady gait  Z86.73 (ICD-10-CM) - History of CVA (cerebrovascular accident)    THERAPY DIAG:  Muscle weakness (generalized)  Other abnormalities of gait and mobility  Unsteadiness on feet  PERTINENT HISTORY:  Previous hx of CVA   PRECAUTIONS: falls  SUBJECTIVE: I am doing okay. Last night didn't sleep well.  PAIN:  Are you having pain? No  OBJECTIVE:  TODAY'S TREATMENT:  02/22/22: Pt has significant forward head. Pt educated on concept of "body goes where head goes"  and keeping head upright to maintain upright posture Reviewed heel to toe walking and walking at normal walking speed (not too fast, not too slow). Sit to stand with 12lb KB from elevated surface: 10x, lowered mat table 1.5" and did it again 10x with moderate difficulty but pt able to complete them. Fwd step up on 2nd step of the stairs with bil HHA: 2 x 5 R and L Stide steps: red band: 2 x 10 steps each way, red band around feet Fwd monster walks: 2 x 20 feet with red band around feet Resisted walking with black sort cord: 4 x 60 feet, rest for few minutes, 4 x 60 feet (total 8 x 40 feet), emphasis on looking straight ahead and improved cadence to work on power generation from muscles.  02/20/22:  Gait speed: 2.62 ft/s (0.58m/s) Gait training: 2 x 20 feet with focusing on striking with heel during initial contact Gait training: 2 x 20 feet with cues to walk closer to his normal cadence, practiced how slow walking can affect his balance 5x sit to stand: pt unable to complete 5 reps without HHA, pt could only do about 3. Sit to stand with 1 HHA: 10x (L UE support) Gait training: Resisted waking with black sport cord, 4 x 60 feet, cues for increased cadence, heel to toe walk - bwd walking with sport cord: 4 x 20 feet, cues for longer step length Standing heel raises and toe raises without HHA: 10x each   02/15/22: Gait training: 1 x 230' with intermittent fast and normal walking speed, cues for arm swings Walking fwd with EC: 2 x 20' Walking bwd with EO: 2 x 20' Walking with high knee marches with pt cued to touch contralateral hand to knee: 2 x 20 feet Stepping fwd, neutral and bwd: stepping over 1/2" pipe: 10x R and L, min A required for 2 LOB Stepping laterally over 1/2" pipe alternating feet with each rep: 10x R and L Sit to stand: 10 lb KB: 10x Heel walking and toe walking: 2 x 20 feet Turning 360 deg in 2 x 2 square: 3x cw, 3x ccw Punching bag in standing: PT randomly telsl patient  number and pt to hit numbers on punching bag. Also performed where pt has to take a step forward and then perform puching for partial tandem balance perturbation: spent about 8'    02/13/22: Sit to stand: 10lbs 10x, required min A for last 5 reps Kicking 7lb ball around the clinic 115' with alternating feet Taking large steps walking fwd: 40 feet with leading with R and 40 feet leading with L LE Walking on toes: 20 feet x 2 Walking on heels: 20 feet x 2  Standing with narrow BOS: holding 3 kg ball: trunk twists: 10x R and L Standing with narrow BOS: holding 3 kg ball: OH reach with tracking ball: 10x  Resisted  walking: 2 x 80 feet with black sport cord Resisted walking: 2 x 40 feet with black sport cord, with cues for increased gait speed  02/08/22: Assessed remainder of FGA Stairs: 5 x 4 steps, 20 steps with bil hand rail Sit to stand: 10x, 5x no HHA Gait training: 88' with CGA, with cues to take longer steps and arm swings   PATIENT EDUCATION: Education details: Pt and wife educated on going to senior center and using their indoor laps to work on walking endurance Person educated: Patient and Spouse Education method: Explanation Education comprehension: verbalized understanding   HOME EXERCISE PROGRAM: [email or web address]   ASSESSMENT: Pt tolerated session well. Pt reported compliance with being conscious of walking heel to toe since last session. We reviewed importance of holding head up to improve overall posture and balance. We progressed exercises and pt tolerated it well.    PT Short Term Goals - 02/06/22 1241       PT SHORT TERM GOAL #1   Title Pt will be independent with initial HEP for strength/balance in order to build upon functional gains made in therapy. ALL STGS DUE 03/06/22    Time 4    Period Weeks    Status On going 02/20/22   Target Date 03/06/22      PT SHORT TERM GOAL #2   Title Pt will finish assessment of FGA with LTG written.    Baseline did not have time to  finish at eval    Time 4    Period Weeks    Status On going 02/08/22(23/30)     PT SHORT TERM GOAL #3   Title Pt will improve gait speed with no AD vs. cane to at least 1.9 ft/sec in order to demo decr fall risk.    Baseline 2.68ft/s (02/20/22)   Time 4    Period Weeks    Status achieved     PT SHORT TERM GOAL #4   Title Pt will improve 5x sit <> stand with no UE support to 19 seconds or less in order to demo decr fall risk/improved functional BLE strength.    Baseline 22.09 seconds    Time 4    Period Weeks    Status On going             PT Long Term Goals - 02/06/22 1244       PT LONG TERM GOAL #1   Title Pt will be independent with final HEP for strength/balance in order to build upon functional gains made in therapy. ALL LTGS DUE 08/07/22    Time 8    Period Weeks    Status New    Target Date 04/03/22      PT LONG TERM GOAL #2   Title Pt will demo >26/30 on FGA to improve functional mobility. Baseline: 23/30 (02/08/22)   Time 8    Period Weeks    Status New      PT LONG TERM GOAL #3   Title Pt will improve 5x sit <> stand with no UE support to 16 seconds or less in order to demo decr fall risk/improved functional BLE strength.    Baseline 22.09 seconds    Time 8    Period Weeks    Status New      PT LONG TERM GOAL #4   Title Pt will improve gait speed with no AD vs. cane to at least 2.2 ft/sec in order to improve community mobility.    Baseline 1.64 ft/sec  Time 8    Period Weeks    Status New              Plan - 02/08/22 0844     Personal Factors and Comorbidities Age;Time since onset of injury/illness/exacerbation;Past/Current Experience;Comorbidity 3+    Comorbidities hypertension, CAD, type 2 diabetes, CVA (R thalamic), hyperlipidemia    Examination-Activity Limitations Stairs;Squat;Transfers;Locomotion Level    Examination-Participation Restrictions Church;Community Activity;Driving;Shop;Yard Work;Cleaning    Stability/Clinical Decision Making  Evolving/Moderate complexity    Rehab Potential Good    PT Frequency 2x / week    PT Duration 12 weeks    PT Treatment/Interventions Patient/family education;ADLs/Self Care Home Management;DME Instruction;Gait training;Stair training;Functional mobility training;Therapeutic exercise;Balance training;Therapeutic activities;Neuromuscular re-education;Vestibular    PT Next Visit Plan Continue to work on cardiovascular endurance, functional strength, and gait endurance.   PT Home Exercise Plan Access Code [email or web address]    Consulted and Agree with Plan of Care Patient;Family member/caregiver    Family Member Consulted Wife               [name], [place] 02/22/2022, 10:48 AM

## 2022-02-26 ENCOUNTER — Ambulatory Visit: Payer: Medicare PPO | Admitting: Cardiovascular Disease

## 2022-02-26 ENCOUNTER — Other Ambulatory Visit: Payer: Self-pay

## 2022-02-26 ENCOUNTER — Encounter: Payer: Self-pay | Admitting: Nurse Practitioner

## 2022-02-26 ENCOUNTER — Ambulatory Visit: Payer: Medicare PPO | Admitting: Nurse Practitioner

## 2022-02-26 ENCOUNTER — Ambulatory Visit (INDEPENDENT_AMBULATORY_CARE_PROVIDER_SITE_OTHER)
Admission: RE | Admit: 2022-02-26 | Discharge: 2022-02-26 | Disposition: A | Discharge status: Home / Self Care | Payer: Medicare PPO | Source: Ambulatory Visit | Attending: Nurse Practitioner | Admitting: Nurse Practitioner

## 2022-02-26 VITALS — BP 138/66 | HR 77 | Temp 97.7°F | Resp 12 | Ht 73.0 in | Wt 221.4 lb

## 2022-02-26 DIAGNOSIS — R051 Acute cough: Secondary | ICD-10-CM

## 2022-02-26 DIAGNOSIS — J069 Acute upper respiratory infection, unspecified: Secondary | ICD-10-CM | POA: Insufficient documentation

## 2022-02-26 DIAGNOSIS — R059 Cough, unspecified: Secondary | ICD-10-CM | POA: Diagnosis not present

## 2022-02-26 MED ORDER — AZITHROMYCIN 250 MG PO TABS: ORAL_TABLET | ORAL | 0 refills | Status: DC

## 2022-02-26 NOTE — Progress Notes (Signed)
Acute Office Visit  Subjective:    Patient ID: [name] [name], male    DOB: 1941-01-01, 81 y.o.   MRN: [id number]  Chief Complaint  Patient presents with   Cough    Sx started on 2/23 or 02/23/22-Coughing up yellow phlegm, weaker feeling, temp 99.8 this morning. No sore throat or headache. Left eye has had some crusty/matted stuff in it x 2 days. Covid test negative at home about 2 day ago     Patient is in today for Cough  Symptoms started approx 3-4 days ago Pfizer vaccine x2 and one booster Covid tested that was negative approx 2-3 days ago States his wife had the same stuff and finished her abx Saturday (Z-pak) Has been taking delsym, not sure if it helped Symptoms have been about the same since it started but he feels weaker.   Past Medical History:  Diagnosis Date   Basal cell carcinoma 11/09/2019   nod & infil-behind right ear-cx3 &exc   Basal cell carcinoma 03/21/2020   Residual BCC with peripheral margin involved - ST recommends MOHs   Carotid artery occlusion    Coronary artery disease    s/p CABG 2005; sees Dr Johnsie Cancel yearly   Diabetes mellitus without complication ([place])    Gynecomastia    Hypercholesterolemia    Hypertension    Neurodermatitis    Overweight(278.02)    Personal history of colonic polyps 10/08/2006   tubular adenomas   Renal insufficiency    Stroke [facility])    TIA  April 2022   Transient ischemic attack 2010   "lasted ~ 5 seconds"   Vitamin D deficiency     Past Surgical History:  Procedure Laterality Date   CARDIAC CATHETERIZATION  02/2004   "tried to stent; couldn't"   CAROTID ENDARTERECTOMY Right 08/26/2015   COLONOSCOPY     CORONARY ANGIOPLASTY     CORONARY ARTERY BYPASS GRAFT  Feb. 2005   4 vessel   ENDARTERECTOMY Right 08/26/2015   Procedure: Right Carotid ENDARTERECTOMY with Patch Angioplasty ;  Surgeon: Rosetta Posner, MD;  Location: [facility] OR;  Service: Vascular;  Laterality: Right;   ENDARTERECTOMY Left 05/18/2021   Procedure: LEFT  CAROTID ARTERY ENDARTERECTOMY  with patch angioplasty;  Surgeon: Rosetta Posner, MD;  Location: [facility] OR;  Service: Vascular;  Laterality: Left;   EYE SURGERY     bilateral cataract   KELOID EXCISION  04/2008   on chest scar; Dr. Dessie Coma   KELOID EXCISION     PILONIDAL CYST EXCISION  1989    Family History  Problem Relation Age of Onset   Heart disease Mother        Before age 21   Diabetes Mother    Kidney disease Mother    Heart attack Mother 70   Lung cancer Father 50   Diabetes Brother    Heart disease Brother    Heart disease Brother    Arthritis Brother    Diabetes Sister    Fibromyalgia Sister    Lung cancer Paternal Uncle        questionable as to if it was lung ca   Healthy Daughter    Colon cancer Neg Hx    Stroke Neg Hx     Social History   Socioeconomic History   Marital status: Married    Spouse name: [name]   Number of children: 1   Years of education: Not on file   Highest education level: Some college, no degree  Occupational History  Occupation: retired  Tobacco Use   Smoking status: Never   Smokeless tobacco: Current    Types: Snuff   Tobacco comments:    uses 2 dip/week  Vaping Use   Vaping Use: Never used  Substance and Sexual Activity   Alcohol use: No    Alcohol/week: 0.0 standard drinks   Drug use: No   Sexual activity: Yes  Other Topics Concern   Not on file  Social History Narrative   Retired.   Once worked for the [employer].   Married.   Enjoys reading, spending time with family.    Left handed   Drinks caffeine   One story home   Social Determinants of Health   Financial Resource Strain: Low Risk    Difficulty of Paying Living Expenses: Not hard at all  Food Insecurity: No Food Insecurity   Worried About Charity fundraiser in the Last Year: Never true   Arboriculturist in the Last Year: Never true  Transportation Needs: No Transportation Needs   Lack of Transportation (Medical): No   Lack of Transportation (Non-Medical): No   Physical Activity: Insufficiently Active   Days of Exercise per Week: 1 day   Minutes of Exercise per Session: 20 min  Stress: No Stress Concern Present   Feeling of Stress : Not at all  Social Connections: Moderately Integrated   Frequency of Communication with Friends and Family: More than three times a week   Frequency of Social Gatherings with Friends and Family: More than three times a week   Attends Religious Services: More than 4 times per year   Active Member of [employer] or Organizations: No   Attends Archivist Meetings: Never   Marital Status: Married  Human resources officer Violence: Not on file    Outpatient Medications Prior to Visit  Medication Sig Dispense Refill   atorvastatin (LIPITOR) 20 MG tablet Take 0.5 tablets (10 mg total) by mouth daily. For cholesterol. 45 tablet 1   cholecalciferol (VITAMIN D3) 25 MCG (1000 UNIT) tablet Take 1,000 Units by mouth daily.     clopidogrel (PLAVIX) 75 MG tablet Take 1 tablet (75 mg total) by mouth daily. 90 tablet 3   hydrochlorothiazide (MICROZIDE) 12.5 MG capsule Take 1 capsule (12.5 mg total) by mouth daily. 90 capsule 3   losartan (COZAAR) 25 MG tablet Take 2 tablets (50 mg total) by mouth daily. For blood pressure. 180 tablet 1   nitroGLYCERIN (NITROSTAT) 0.4 MG SL tablet Place 1 tablet (0.4 mg total) under the tongue every 5 (five) minutes as needed for chest pain. 25 tablet 3   vitamin B-12 (CYANOCOBALAMIN) 1000 MCG tablet Take 1,000 mcg by mouth daily. Taking 5 days a week     No facility-administered medications prior to visit.    Allergies  Allergen Reactions   Niacin Other (See Comments)    intol to NIACIN w/ headaches     Review of Systems  Constitutional:  Positive for appetite change, fatigue and fever. Negative for chills.  HENT:  Positive for ear pain. Negative for congestion, ear discharge, postnasal drip, sinus pressure, sinus pain and sore throat.   Respiratory:  Positive for cough (yellow). Negative  for shortness of breath.   Cardiovascular:  Negative for chest pain.  Gastrointestinal:  Negative for abdominal pain, diarrhea, nausea and vomiting.  Musculoskeletal:  Negative for arthralgias and myalgias.  Neurological:  Negative for headaches.      Objective:    Physical Exam Constitutional:  Appearance: Normal appearance.  HENT:     Right Ear: Tympanic membrane, ear canal and external ear normal.     Left Ear: Tympanic membrane, ear canal and external ear normal.     Mouth/Throat:     Mouth: Mucous membranes are moist.     Pharynx: No posterior oropharyngeal erythema.  Cardiovascular:     Rate and Rhythm: Normal rate and regular rhythm.     Heart sounds: Normal heart sounds.  Pulmonary:     Effort: Pulmonary effort is normal.     Breath sounds: Normal breath sounds.  Lymphadenopathy:     Cervical: No cervical adenopathy.  Neurological:     Mental Status: He is alert.    BP 138/66    Pulse 77    Temp 97.7 F (36.5 C)    Resp 12    Ht _0  (1.854 m)    Wt 221 lb 7 oz (100.4 kg)    SpO2 96%    BMI 29.22 kg/m  Wt Readings from Last 3 Encounters:  02/26/22 221 lb 7 oz (100.4 kg)  01/25/22 224 lb (101.6 kg)  01/25/22 224 lb 6.4 oz (101.8 kg)    Health Maintenance Due  Topic Date Due   Hepatitis C Screening  Never done   TETANUS/TDAP  05/16/2020   COVID-19 Vaccine (4 - Booster for Pfizer series) 01/13/2021   HEMOGLOBIN A1C  02/15/2022    There are no preventive care reminders to display for this patient.   Lab Results  Component Value Date   TSH 2.483 04/03/2021   Lab Results  Component Value Date   WBC 5.9 08/15/2021   HGB 15.2 08/15/2021   HCT 44.7 08/15/2021   MCV 102.9 (H) 08/15/2021   PLT 189.0 08/15/2021   Lab Results  Component Value Date   NA 143 08/03/2021   K 4.2 08/03/2021   CO2 25 08/03/2021   GLUCOSE 132 (H) 08/03/2021   BUN 19 08/03/2021   CREATININE 1.06 08/03/2021   BILITOT 0.9 06/15/2021   ALKPHOS 106 06/15/2021   AST 18  06/15/2021   ALT 18 06/15/2021   PROT 6.3 06/15/2021   ALBUMIN 3.9 06/15/2021   CALCIUM 9.1 08/03/2021   ANIONGAP 5 05/19/2021   EGFR 72 08/03/2021   GFR 60.09 06/15/2021   Lab Results  Component Value Date   CHOL 116 08/15/2021   Lab Results  Component Value Date   HDL 30.50 (L) 08/15/2021   Lab Results  Component Value Date   LDLCALC 56 08/15/2021   Lab Results  Component Value Date   TRIG 147.0 08/15/2021   Lab Results  Component Value Date   CHOLHDL 4 08/15/2021   Lab Results  Component Value Date   HGBA1C 6.6 (H) 08/15/2021       Assessment & Plan:   Problem List Items Addressed This Visit       Respiratory   Upper respiratory tract infection    Patient presented with wife who was concerned as patient has gotten worse and felt weak.  We will go ahead and elect to treat patient with azithromycin 250 mg pack per instructions.  Did discuss signs and symptoms when to seek urgent or emergent health care.  Follow-up if no improvement      Relevant Medications   azithromycin (ZITHROMAX) 250 MG tablet     Other   Acute cough - Primary    Can continue over-the-counter Delsym as needed.      Relevant Orders   DG  Chest 2 View (Completed)     Meds ordered this encounter  Medications   azithromycin (ZITHROMAX) 250 MG tablet    Sig: Take 2 tablets on day 1, then 1 tablet daily on days 2 through 5    Dispense:  6 tablet    Refill:  0    Order Specific Question:   Supervising Provider    Answer:   Loura Pardon A [[id number]]   This visit occurred during the SARS-CoV-2 public health emergency.  Safety protocols were in place, including screening questions prior to the visit, additional usage of staff PPE, and extensive cleaning of exam room while observing appropriate contact time as indicated for disinfecting solutions.   Romilda Garret, NP

## 2022-02-26 NOTE — Assessment & Plan Note (Signed)
Can continue over-the-counter Delsym as needed.

## 2022-02-26 NOTE — Assessment & Plan Note (Signed)
Patient presented with wife who was concerned as patient has gotten worse and felt weak.  We will go ahead and elect to treat patient with azithromycin 250 mg pack per instructions.  Did discuss signs and symptoms when to seek urgent or emergent health care.  Follow-up if no improvement

## 2022-02-26 NOTE — Patient Instructions (Signed)
Nice to see you today I will be in touch with the xray results after that I will call and let you know and talk about the medication Follow if no improvement

## 2022-02-27 ENCOUNTER — Ambulatory Visit: Payer: Medicare PPO

## 2022-02-28 ENCOUNTER — Observation Stay (HOSPITAL_COMMUNITY): Payer: Medicare PPO

## 2022-02-28 ENCOUNTER — Inpatient Hospital Stay (HOSPITAL_COMMUNITY)
Admission: EM | Admit: 2022-02-28 | Discharge: 2022-03-04 | DRG: 064 | Disposition: A | Discharge status: Home Health | Payer: Medicare PPO | Attending: Internal Medicine | Admitting: Internal Medicine

## 2022-02-28 ENCOUNTER — Emergency Department (HOSPITAL_COMMUNITY): Payer: Medicare PPO

## 2022-02-28 ENCOUNTER — Telehealth: Payer: Self-pay

## 2022-02-28 DIAGNOSIS — R1310 Dysphagia, unspecified: Secondary | ICD-10-CM | POA: Diagnosis present

## 2022-02-28 DIAGNOSIS — Z85828 Personal history of other malignant neoplasm of skin: Secondary | ICD-10-CM

## 2022-02-28 DIAGNOSIS — R471 Dysarthria and anarthria: Secondary | ICD-10-CM | POA: Diagnosis present

## 2022-02-28 DIAGNOSIS — R0602 Shortness of breath: Secondary | ICD-10-CM

## 2022-02-28 DIAGNOSIS — Z79899 Other long term (current) drug therapy: Secondary | ICD-10-CM

## 2022-02-28 DIAGNOSIS — I6503 Occlusion and stenosis of bilateral vertebral arteries: Secondary | ICD-10-CM | POA: Diagnosis not present

## 2022-02-28 DIAGNOSIS — I6381 Other cerebral infarction due to occlusion or stenosis of small artery: Secondary | ICD-10-CM

## 2022-02-28 DIAGNOSIS — I6523 Occlusion and stenosis of bilateral carotid arteries: Secondary | ICD-10-CM | POA: Diagnosis not present

## 2022-02-28 DIAGNOSIS — R29898 Other symptoms and signs involving the musculoskeletal system: Secondary | ICD-10-CM | POA: Diagnosis not present

## 2022-02-28 DIAGNOSIS — R531 Weakness: Secondary | ICD-10-CM | POA: Diagnosis not present

## 2022-02-28 DIAGNOSIS — R29703 NIHSS score 3: Secondary | ICD-10-CM | POA: Diagnosis not present

## 2022-02-28 DIAGNOSIS — J690 Pneumonitis due to inhalation of food and vomit: Secondary | ICD-10-CM | POA: Diagnosis present

## 2022-02-28 DIAGNOSIS — I10 Essential (primary) hypertension: Secondary | ICD-10-CM | POA: Diagnosis present

## 2022-02-28 DIAGNOSIS — M47812 Spondylosis without myelopathy or radiculopathy, cervical region: Secondary | ICD-10-CM | POA: Diagnosis not present

## 2022-02-28 DIAGNOSIS — Z8249 Family history of ischemic heart disease and other diseases of the circulatory system: Secondary | ICD-10-CM | POA: Diagnosis not present

## 2022-02-28 DIAGNOSIS — R2990 Unspecified symptoms and signs involving the nervous system: Secondary | ICD-10-CM | POA: Diagnosis not present

## 2022-02-28 DIAGNOSIS — G319 Degenerative disease of nervous system, unspecified: Secondary | ICD-10-CM | POA: Diagnosis not present

## 2022-02-28 DIAGNOSIS — L280 Lichen simplex chronicus: Secondary | ICD-10-CM | POA: Diagnosis present

## 2022-02-28 DIAGNOSIS — I639 Cerebral infarction, unspecified: Principal | ICD-10-CM | POA: Diagnosis present

## 2022-02-28 DIAGNOSIS — Z20822 Contact with and (suspected) exposure to covid-19: Secondary | ICD-10-CM | POA: Diagnosis present

## 2022-02-28 DIAGNOSIS — Z9861 Coronary angioplasty status: Secondary | ICD-10-CM | POA: Diagnosis not present

## 2022-02-28 DIAGNOSIS — G8194 Hemiplegia, unspecified affecting left nondominant side: Secondary | ICD-10-CM | POA: Diagnosis present

## 2022-02-28 DIAGNOSIS — E785 Hyperlipidemia, unspecified: Secondary | ICD-10-CM | POA: Diagnosis present

## 2022-02-28 DIAGNOSIS — F17290 Nicotine dependence, other tobacco product, uncomplicated: Secondary | ICD-10-CM | POA: Diagnosis present

## 2022-02-28 DIAGNOSIS — R29810 Facial weakness: Secondary | ICD-10-CM | POA: Diagnosis present

## 2022-02-28 DIAGNOSIS — E559 Vitamin D deficiency, unspecified: Secondary | ICD-10-CM | POA: Diagnosis present

## 2022-02-28 DIAGNOSIS — R4701 Aphasia: Secondary | ICD-10-CM | POA: Diagnosis present

## 2022-02-28 DIAGNOSIS — J189 Pneumonia, unspecified organism: Secondary | ICD-10-CM | POA: Diagnosis present

## 2022-02-28 DIAGNOSIS — E7849 Other hyperlipidemia: Secondary | ICD-10-CM | POA: Diagnosis not present

## 2022-02-28 DIAGNOSIS — Z951 Presence of aortocoronary bypass graft: Secondary | ICD-10-CM | POA: Diagnosis not present

## 2022-02-28 DIAGNOSIS — J069 Acute upper respiratory infection, unspecified: Secondary | ICD-10-CM | POA: Diagnosis not present

## 2022-02-28 DIAGNOSIS — I2510 Atherosclerotic heart disease of native coronary artery without angina pectoris: Secondary | ICD-10-CM | POA: Diagnosis present

## 2022-02-28 DIAGNOSIS — R1312 Dysphagia, oropharyngeal phase: Secondary | ICD-10-CM | POA: Diagnosis not present

## 2022-02-28 DIAGNOSIS — I6522 Occlusion and stenosis of left carotid artery: Secondary | ICD-10-CM | POA: Diagnosis not present

## 2022-02-28 DIAGNOSIS — E7800 Pure hypercholesterolemia, unspecified: Secondary | ICD-10-CM | POA: Diagnosis present

## 2022-02-28 DIAGNOSIS — Z833 Family history of diabetes mellitus: Secondary | ICD-10-CM

## 2022-02-28 DIAGNOSIS — Z8616 Personal history of COVID-19: Secondary | ICD-10-CM | POA: Diagnosis not present

## 2022-02-28 DIAGNOSIS — I6389 Other cerebral infarction: Secondary | ICD-10-CM | POA: Diagnosis not present

## 2022-02-28 DIAGNOSIS — M4802 Spinal stenosis, cervical region: Secondary | ICD-10-CM | POA: Diagnosis not present

## 2022-02-28 DIAGNOSIS — Z888 Allergy status to other drugs, medicaments and biological substances status: Secondary | ICD-10-CM

## 2022-02-28 DIAGNOSIS — I779 Disorder of arteries and arterioles, unspecified: Secondary | ICD-10-CM | POA: Diagnosis present

## 2022-02-28 DIAGNOSIS — Z8673 Personal history of transient ischemic attack (TIA), and cerebral infarction without residual deficits: Secondary | ICD-10-CM | POA: Diagnosis not present

## 2022-02-28 DIAGNOSIS — E119 Type 2 diabetes mellitus without complications: Secondary | ICD-10-CM | POA: Diagnosis present

## 2022-02-28 DIAGNOSIS — Z86010 Personal history of colonic polyps: Secondary | ICD-10-CM | POA: Diagnosis not present

## 2022-02-28 DIAGNOSIS — R4781 Slurred speech: Secondary | ICD-10-CM | POA: Diagnosis not present

## 2022-02-28 DIAGNOSIS — Z7902 Long term (current) use of antithrombotics/antiplatelets: Secondary | ICD-10-CM

## 2022-02-28 DIAGNOSIS — I517 Cardiomegaly: Secondary | ICD-10-CM | POA: Diagnosis not present

## 2022-02-28 HISTORY — DX: Pneumonitis due to inhalation of food and vomit: J69.0

## 2022-02-28 LAB — RAPID URINE DRUG SCREEN, HOSP PERFORMED
Amphetamines: NOT DETECTED
Barbiturates: NOT DETECTED
Benzodiazepines: NOT DETECTED
Cocaine: NOT DETECTED
Opiates: NOT DETECTED
Tetrahydrocannabinol: NOT DETECTED

## 2022-02-28 LAB — URINALYSIS, ROUTINE W REFLEX MICROSCOPIC
Bilirubin Urine: NEGATIVE
Glucose, UA: NEGATIVE mg/dL
Hgb urine dipstick: NEGATIVE
Ketones, ur: NEGATIVE mg/dL
Leukocytes,Ua: NEGATIVE
Nitrite: NEGATIVE
Protein, ur: NEGATIVE mg/dL
Specific Gravity, Urine: 1.005 (ref 1.005–1.030)
pH: 5 (ref 5.0–8.0)

## 2022-02-28 LAB — COMPREHENSIVE METABOLIC PANEL
ALT: 17 U/L (ref 0–44)
AST: 19 U/L (ref 15–41)
Albumin: 3.6 g/dL (ref 3.5–5.0)
Alkaline Phosphatase: 86 U/L (ref 38–126)
Anion gap: 8 (ref 5–15)
BUN: 15 mg/dL (ref 8–23)
CO2: 28 mmol/L (ref 22–32)
Calcium: 8.8 mg/dL — ABNORMAL LOW (ref 8.9–10.3)
Chloride: 102 mmol/L (ref 98–111)
Creatinine, Ser: 1.14 mg/dL (ref 0.61–1.24)
GFR, Estimated: >60 mL/min (ref >60)
Glucose, Bld: 120 mg/dL — ABNORMAL HIGH (ref 70–99)
Potassium: 3.8 mmol/L (ref 3.5–5.1)
Sodium: 138 mmol/L (ref 135–145)
Total Bilirubin: 1.1 mg/dL (ref 0.3–1.2)
Total Protein: 6.7 g/dL (ref 6.5–8.1)

## 2022-02-28 LAB — CBC WITH DIFFERENTIAL/PLATELET
Abs Immature Granulocytes: 0.04 10*3/uL (ref 0.00–0.07)
Basophils Absolute: 0 10*3/uL (ref 0.0–0.1)
Basophils Relative: 1 %
Eosinophils Absolute: 0.1 10*3/uL (ref 0.0–0.5)
Eosinophils Relative: 2 %
HCT: 43.4 % (ref 39.0–52.0)
Hemoglobin: 15.1 g/dL (ref 13.0–17.0)
Immature Granulocytes: 1 %
Lymphocytes Relative: 18 %
Lymphs Abs: 1.2 10*3/uL (ref 0.7–4.0)
MCH: 35.1 pg — ABNORMAL HIGH (ref 26.0–34.0)
MCHC: 34.8 g/dL (ref 30.0–36.0)
MCV: 100.9 fL — ABNORMAL HIGH (ref 80.0–100.0)
Monocytes Absolute: 0.7 10*3/uL (ref 0.1–1.0)
Monocytes Relative: 10 %
Neutro Abs: 4.5 10*3/uL (ref 1.7–7.7)
Neutrophils Relative %: 68 %
Platelets: 226 10*3/uL (ref 150–400)
RBC: 4.3 MIL/uL (ref 4.22–5.81)
RDW: 13.2 % (ref 11.5–15.5)
WBC: 6.5 10*3/uL (ref 4.0–10.5)
nRBC: 0 % (ref 0.0–0.2)

## 2022-02-28 LAB — RESP PANEL BY RT-PCR (FLU A&B, COVID) ARPGX2
Influenza A by PCR: NEGATIVE
Influenza B by PCR: NEGATIVE
SARS Coronavirus 2 by RT PCR: NEGATIVE

## 2022-02-28 LAB — APTT: aPTT: 29 seconds (ref 24–36)

## 2022-02-28 LAB — PROCALCITONIN: Procalcitonin: <0.1 ng/mL

## 2022-02-28 LAB — PROTIME-INR
INR: 1 (ref 0.8–1.2)
Prothrombin Time: 13.5 seconds (ref 11.4–15.2)

## 2022-02-28 LAB — CBG MONITORING, ED: Glucose-Capillary: 119 mg/dL — ABNORMAL HIGH (ref 70–99)

## 2022-02-28 MED ORDER — SODIUM CHLORIDE 0.9 % IV SOLN
500.0000 mg | INTRAVENOUS | Status: AC
  Administered 2022-03-01 – 2022-03-02 (×2): 500 mg via INTRAVENOUS
  Filled 2022-02-28 (×2): qty 5

## 2022-02-28 MED ORDER — ACETAMINOPHEN 325 MG PO TABS: 650.0000 mg | ORAL_TABLET | ORAL | Status: DC | PRN

## 2022-02-28 MED ORDER — INSULIN ASPART 100 UNIT/ML IJ SOLN
0.0000 [IU] | Freq: Three times a day (TID) | INTRAMUSCULAR | Status: DC
  Administered 2022-03-01 – 2022-03-03 (×3): 1 [IU] via SUBCUTANEOUS

## 2022-02-28 MED ORDER — ACETAMINOPHEN 650 MG RE SUPP: 650.0000 mg | RECTAL | Status: DC | PRN

## 2022-02-28 MED ORDER — STROKE: EARLY STAGES OF RECOVERY BOOK
Freq: Once | Status: AC
  Filled 2022-02-28: qty 1

## 2022-02-28 MED ORDER — SODIUM CHLORIDE 0.9 % IV SOLN
1.0000 g | INTRAVENOUS | Status: DC
  Administered 2022-02-28 – 2022-03-01 (×2): 1 g via INTRAVENOUS
  Filled 2022-02-28 (×2): qty 10

## 2022-02-28 MED ORDER — HYDRALAZINE HCL 20 MG/ML IJ SOLN: 5.0000 mg | Freq: Four times a day (QID) | INTRAMUSCULAR | Status: DC | PRN

## 2022-02-28 MED ORDER — LORAZEPAM 2 MG/ML IJ SOLN
2.0000 mg | Freq: Once | INTRAMUSCULAR | Status: AC
  Administered 2022-02-28: 2 mg via INTRAVENOUS
  Filled 2022-02-28: qty 1

## 2022-02-28 MED ORDER — ACETAMINOPHEN 160 MG/5ML PO SOLN: 650.0000 mg | ORAL | Status: DC | PRN

## 2022-02-28 MED ORDER — ENOXAPARIN SODIUM 40 MG/0.4ML IJ SOSY
40.0000 mg | PREFILLED_SYRINGE | INTRAMUSCULAR | Status: DC
  Administered 2022-02-28 – 2022-03-03 (×4): 40 mg via SUBCUTANEOUS
  Filled 2022-02-28 (×4): qty 0.4

## 2022-02-28 NOTE — Assessment & Plan Note (Addendum)
Symptoms >2 days, continue home medication when no longer NPO ?PRN IV medication with parameters  ?

## 2022-02-28 NOTE — ED Notes (Signed)
To mri 

## 2022-02-28 NOTE — Assessment & Plan Note (Signed)
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  ?-echo and labs in AM. LDL was 56 in 07/2021. A1c was 6.6  ?-on plavix now. F/u on neuro recs.  ?-N.p.o. until bedside swallow screen: FAILED bedside swallow with concerns for aspiration, NPO until SLP can evaluate  ?-PT/ OT/ SLP consult ? ?

## 2022-02-28 NOTE — ED Notes (Signed)
Family remains at the bedside.

## 2022-02-28 NOTE — Assessment & Plan Note (Signed)
A1c of 6.6 in 07/2021 ?SSI and accuchecks per protocol ?Repeat a1c pending with stroke w/u  ?

## 2022-02-28 NOTE — Telephone Encounter (Signed)
Agree with ER for evaluation

## 2022-02-28 NOTE — ED Triage Notes (Addendum)
W

## 2022-02-28 NOTE — H&P (Signed)
History and Physical    Patient: [name] [name] QBH:[id number] DOB: 02-Oct-1941 DOA: 02/28/2022 DOS: the patient was seen and examined on 02/28/2022 PCP: Pleas Koch, NP  Patient coming from: Home - lives with his wife. Uses a cane when out of house.    Chief Complaint: left sided weakness, slurred speech.   HPI: [name] [name] is a 81 y.o. male with medical history significant of hx of thalamic stroke in 03/2021, HTN, HLD, T2DM, CAD s/p CABG 2005, carotid artery disease who presented to ED with concern for stroke. History from his wife and daughter. His wife states he started to have left sided weakness/leg weakness and slurred speech Monday night. His left leg would drag. He was LKW Monday morning. Tuesday she noticed more weakness and worsening speech. She also noticed left sided facial droop. He didn't want to go to the doctor. He told his daughter that Tuesday morning there could have been a possibility he had a TIA because he was unable to get out of his recliner due to weakness. His daughter called him this AM and also thought his speech was worse and told him to go the hospital.  He had left sided facial droop, left sided weakness and aphasia that started 3 days ago and have gotten progressively worse. Also has been having coughing and choking with eating that has been worse over the past 2-3 days.   He denies any fever/chills, dizziness/headaches, chest pain or palpitations, no shortness of breath, +productive cough that is creamy in color, stomach pain, N/V/D, dysuria, no leg swelling from baseline. No confusion.   Saw his PCP on Monday for low grade fever/coughing and treated with zpack.    ER Course:  vitals: afebrile, bp: 135/53, HR; 66, RR: 16, oxygen: 95% on RA Pertinent labs: none CT head: no acute finding, multiple remote lacunar infarcts on background of parenchymal volume loss and chronic white matter microangiopathy.  In ED: code stroke activated, neurology  consulted.    Review of Systems: As mentioned in the history of present illness. All other systems reviewed and are negative. Past Medical History:  Diagnosis Date   Basal cell carcinoma 11/09/2019   nod & infil-behind right ear-cx3 &exc   Basal cell carcinoma 03/21/2020   Residual BCC with peripheral margin involved - ST recommends MOHs   Carotid artery occlusion    Coronary artery disease    s/p CABG 2005; sees Dr Johnsie Cancel yearly   Diabetes mellitus without complication ([place])    Gynecomastia    Hypercholesterolemia    Hypertension    Neurodermatitis    Overweight(278.02)    Personal history of colonic polyps 10/08/2006   tubular adenomas   Renal insufficiency    Stroke [facility])    TIA  April 2022   Transient ischemic attack 2010   "lasted ~ 5 seconds"   Vitamin D deficiency    Past Surgical History:  Procedure Laterality Date   CARDIAC CATHETERIZATION  02/2004   "tried to stent; couldn't"   CAROTID ENDARTERECTOMY Right 08/26/2015   COLONOSCOPY     CORONARY ANGIOPLASTY     CORONARY ARTERY BYPASS GRAFT  Feb. 2005   4 vessel   ENDARTERECTOMY Right 08/26/2015   Procedure: Right Carotid ENDARTERECTOMY with Patch Angioplasty ;  Surgeon: Rosetta Posner, MD;  Location: [facility] OR;  Service: Vascular;  Laterality: Right;   ENDARTERECTOMY Left 05/18/2021   Procedure: LEFT CAROTID ARTERY ENDARTERECTOMY  with patch angioplasty;  Surgeon: Rosetta Posner, MD;  Location: [facility]  OR;  Service: Vascular;  Laterality: Left;   EYE SURGERY     bilateral cataract   KELOID EXCISION  04/2008   on chest scar; Dr. Dessie Coma   KELOID EXCISION     PILONIDAL CYST EXCISION  1989   Social History:  reports that he has never smoked. His smokeless tobacco use includes snuff. He reports that he does not drink alcohol and does not use drugs.  Allergies  Allergen Reactions   Niacin Other (See Comments)    intol to NIACIN w/ headaches     Family History  Problem Relation Age of Onset   Heart disease Mother         Before age 7   Diabetes Mother    Kidney disease Mother    Heart attack Mother 19   Lung cancer Father 81   Diabetes Brother    Heart disease Brother    Heart disease Brother    Arthritis Brother    Diabetes Sister    Fibromyalgia Sister    Lung cancer Paternal Uncle        questionable as to if it was lung ca   Healthy Daughter    Colon cancer Neg Hx    Stroke Neg Hx     Prior to Admission medications   Medication Sig Start Date End Date Taking? Authorizing Provider  atorvastatin (LIPITOR) 20 MG tablet Take 0.5 tablets (10 mg total) by mouth daily. For cholesterol. 02/13/22   Pleas Koch, NP  azithromycin (ZITHROMAX) 250 MG tablet Take 2 tablets on day 1, then 1 tablet daily on days 2 through 5 02/26/22 03/03/22  Michela Pitcher, NP  cholecalciferol (VITAMIN D3) 25 MCG (1000 UNIT) tablet Take 1,000 Units by mouth daily.    [provider]  clopidogrel (PLAVIX) 75 MG tablet Take 1 tablet (75 mg total) by mouth daily. 04/12/21   Angiulli, Lavon Paganini, PA-C  hydrochlorothiazide (MICROZIDE) 12.5 MG capsule Take 1 capsule (12.5 mg total) by mouth daily. 08/07/21   Josue Hector, MD  losartan (COZAAR) 25 MG tablet Take 2 tablets (50 mg total) by mouth daily. For blood pressure. 12/03/21   Pleas Koch, NP  nitroGLYCERIN (NITROSTAT) 0.4 MG SL tablet Place 1 tablet (0.4 mg total) under the tongue every 5 (five) minutes as needed for chest pain. 07/14/21   Josue Hector, MD  vitamin B-12 (CYANOCOBALAMIN) 1000 MCG tablet Take 1,000 mcg by mouth daily. Taking 5 days a week    [provider]    Physical Exam: Vitals:   02/28/22 1600 02/28/22 1630 02/28/22 1825 02/28/22 1830  BP: (!) 169/69 (!) 164/71 (!) 153/122   Pulse: 69 75 74 79  Resp: 20  11 (!) 27  Temp:      TempSrc:      SpO2: 94% 95% 93% 94%  Weight:      Height:       General:  Appears calm and comfortable and is in NAD Eyes:  PERRL, EOMI, normal lids, iris ENT:  grossly normal hearing, lips &  tongue, mmm; appropriate dentition Neck:  no LAD, masses or thyromegaly; no carotid bruits Cardiovascular:  RRR, no m/r/g. +BLE edema, non pitting  Respiratory:   +crackles in RLL.  no wheezes.  Normal respiratory effort. Abdomen:  soft, NT, ND, NABS Back:   normal alignment, no CVAT Skin:  no rash or induration seen on limited exam Musculoskeletal:  grossly normal tone. RUE/RLE: 5/5. LUE/LLE: 4/5 good ROM, no bony abnormality Lower  extremity:   Limited foot exam with no ulcerations.  2+ distal pulses. Psychiatric:  grossly normal mood and affect, speech mildly dysarthric/slurred AOx3 Neurologic:  CN II-VI grossly intact VII: left sided mouth droop, good laryngeal elevation. VIII-XII: wnl moves all extremities in coordinated fashion, sensation intact. HTK intact bilaterally. FTN intact bilaterally. +pronator drift    Radiological Exams on Admission: Independently reviewed - see discussion in A/P where applicable  CT Head Wo Contrast  Result Date: 02/28/2022 CLINICAL DATA:  TIA, slurred speech EXAM: CT HEAD WITHOUT CONTRAST TECHNIQUE: Contiguous axial images were obtained from the base of the skull through the vertex without intravenous contrast. RADIATION DOSE REDUCTION: This exam was performed according to the departmental dose-optimization program which includes automated exposure control, adjustment of the mA and/or kV according to patient size and/or use of iterative reconstruction technique. COMPARISON:  Brain MRI 04/03/2021, CT head 04/03/2021 FINDINGS: Brain: There is no evidence of acute intracranial hemorrhage, extra-axial fluid collection, or acute infarct. There is mild global parenchymal volume loss with prominence of the ventricular system and extra-axial CSF spaces. The ventricles are stable in size compared to the prior CT. There are remote lacunar infarcts in the bilateral basal ganglia, thalami, and corona radiata, and a remote cortical infarct in the high right frontal lobe in the  precentral gyrus, unchanged (3-27). Otherwise, gray-white differentiation is preserved. There is no mass lesion.  There is no mass effect or midline shift. Vascular: There is calcification of the bilateral cavernous ICAs. Skull: Normal. Negative for fracture or focal lesion. Sinuses/Orbits: The imaged paranasal sinuses are clear. Bilateral lens implants are in place. The globes and orbits are otherwise unremarkable. Other: None. IMPRESSION: 1. No acute intracranial pathology. 2. Multiple remote lacunar infarcts on a background of parenchymal volume loss and chronic white matter microangiopathy as above. Electronically Signed   By: Valetta Mole M.D.   On: 02/28/2022 11:47   MR BRAIN WO CONTRAST  Result Date: 02/28/2022 CLINICAL DATA:  Acute neuro deficit. Slurred speech, left-sided weakness EXAM: MRI HEAD WITHOUT CONTRAST TECHNIQUE: Multiplanar, multiecho pulse sequences of the brain and surrounding structures were obtained without intravenous contrast. COMPARISON:  CT head 02/28/2022 FINDINGS: Brain: Motion degraded study Acute infarct in the right corona radiata.  No other acute infarct Generalized atrophy. Chronic microvascular ischemic changes in the white matter and basal ganglia bilaterally. Negative for hemorrhage, mass, or hydrocephalus. Vascular: Normal arterial flow voids at the skull base Skull and upper cervical spine: No focal lesion. Sinuses/Orbits: Mild mucosal edema paranasal sinuses. Bilateral cataract extraction Other: None IMPRESSION: Acute infarct right corona radiata. Atrophy and moderate chronic microvascular ischemic change Motion degraded study. Electronically Signed   By: Franchot Gallo M.D.   On: 02/28/2022 18:24   DG CHEST PORT 1 VIEW  Result Date: 02/28/2022 CLINICAL DATA:  Dysphagia. EXAM: PORTABLE CHEST 1 VIEW COMPARISON:  Chest radiograph dated 02/26/2022. FINDINGS: There is shallow inspiration. Cardiomegaly with vascular congestion. Left mid to lower lung field interstitial  densities have progressed since the prior radiograph and may represent worsening edema or pneumonia. Right lung base atelectasis. No large pleural effusion. No pneumothorax. Stable cardiomediastinal silhouette. 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 No acute osseous pathology. IMPRESSION: 1. Cardiomegaly with vascular congestion. 2. Left mid to lower lung field interstitial densities have progressed since the prior radiograph and may represent worsening edema or pneumonia. Electronically Signed   By: Anner Crete M.D.   On: 02/28/2022 18:53    EKG: Independently reviewed.  NSR with rate 65;  nonspecific ST changes with no evidence of acute ischemia   Labs on Admission: I have personally reviewed the available labs and imaging studies at the time of the admission.    Assessment and Plan: * Acute cerebrovascular accident (CVA) ([place])- (present on admission) 81 year old male presenting with 2+ day history of slurred speech, left sided weakness and facial droop found to have Acute infarct in the right corona radiata -place in observation on telemetry for stroke work-up -Neurochecks per protocol -Neurology consulted -MRI brain without contrast done -US carotid ordered with hx of bilateral CEA and carotid artery disease.  -echo and labs in AM. LDL was 56 in 07/2021. A1c was 6.6  -on plavix now. F/u on neuro recs.  -N.p.o. until bedside swallow screen: FAILED bedside swallow with concerns for aspiration, NPO until SLP can evaluate  -PT/ OT/ SLP consult   Aspiration pneumonia vs. CAP Hx of URI with low grade fever and productive cough.  Seen at PCP and given azithromycin -CXR concerning for worsening consolidation in LLL.  Concern for possible aspiration with history of coughing after swallowing and failed bedside swallow screen -check procalcitonin -blood cultures ordered -no WBC, fever or other sepsis/SIRS criteria -check respiratory panel  -NPO until SLP eval -aspiration  precautions -start rocephin, continue azithromycin, has 2 more days   Carotid artery disease ([place])- (present on admission) Hx of right CEA in 2016  Hx of left CEA in 04/2021 by Dr. Donnetta Hutching  Continue plavix/statin Repeat carotid US ordered   Coronary atherosclerosis- (present on admission) -hx of CABG in 2005 -Non ischemic myovue 09/09/18  -continue medical Rx    Essential hypertension- (present on admission) Symptoms >2 days, continue home medication when no longer NPO PRN IV medication with parameters   Type 2 diabetes mellitus (HCC) A1c of 6.6 in 07/2021 SSI and accuchecks per protocol Repeat a1c pending with stroke w/u   Hyperlipidemia- (present on admission) LDL of 56 in 07/2021 Continue lipitor     Advance Care Planning:   Code Status: Full Code   Consults: neurology   DVT Prophylaxis: lovenox  Family Communication: daughter Sharyn Lull) and wife [name]) at bedside   Severity of Illness: The appropriate patient status for this patient is OBSERVATION. Observation status is judged to be reasonable and necessary in order to provide the required intensity of service to ensure the patient's safety. The patient's presenting symptoms, physical exam findings, and initial radiographic and laboratory data in the context of their medical condition is felt to place them at decreased risk for further clinical deterioration. Furthermore, it is anticipated that the patient will be medically stable for discharge from the hospital within 2 midnights of admission.   Author: Orma Flaming, MD 02/28/2022 9:19 PM  For on call review [email or web address].

## 2022-02-28 NOTE — ED Provider Notes (Signed)
?  Physical Exam  ?BP (!) 175/81   Pulse 71   Temp 98 ?F (36.7 ?C) (Oral)   Resp (!) 21   Ht 6\' 1"  (1.854 m)   Wt 99.8 kg   SpO2 95%   BMI 29.03 kg/m?  ? ? ? ?Procedures  ?Procedures ? ?ED Course / MDM  ? ?Clinical Course as of 02/28/22 1621  ?Wed Feb 28, 2022  ?1218 Case discussed with neuro hospitalist regarding probable recent stroke, not visualized on CT imaging.  He recommends MRI.  I explained that the patient is reluctant to get that without sedation.  At that point the neurologist stated he would evaluate the patient, when he can, to determine the appropriate course of action. [EW]  ?1338 He remained stable.  He would like to walk to the bathroom to urinate.  Awaiting neurology evaluation to ensure appropriate MRI testing ordered prior to giving sedation for advanced imaging [EW]  ?  ?Clinical Course User Index ?[EW] Daleen Bo, MD  ? ?Medical Decision Making ?Amount and/or Complexity of Data Reviewed ?Labs: ordered. ?Radiology: ordered. ? ?Risk ?Prescription drug management. ?Decision regarding hospitalization. ? ? ?76M -presenting with new subacute strokes. Needs admission for MRI under sedation. Neuro following. Aphasia, L facial droop and L hemibody weakness.  Dr. Eliberto Ivory of hospitalist medicine accepted the patient in admission.  Admitted in stable condition. ? ? ? ? ?  ?Regan Lemming, MD ?02/28/22 1906 ? ?

## 2022-02-28 NOTE — Consult Note (Signed)
Neurology Consultation  Reason for Consult: Concern for stroke Referring Physician: Dr. Eulis Foster  CC: Left lower extremity weakness, mumbling speech, left facial weakness  History is obtained from: Patient, Patient's daughter and wife at bedside, chart review   HPI: [name] [name] is a 81 y.o. 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 Per patient's wife at bedside, she first noticed that the patient began to feel really tired and weaker than usual on Monday 2/27 afternoon and he required a walker to walk when he usually ambulates around the house unassisted. She states that yesterday, she noticed that his speech was mumbling, slurred, and imprecise as well. The patient's daughter states that she speaks to her father every morning on the phone and did not notice that he had slurred speech until speaking with him this morning prompting her to go to his house and check on him. When she saw him, she noted that he was dragging his left leg with significant weakness and brought him to the ED for further evaluation. The patient states that he first noted weakness and speech changes yesterday morning on 2/28 and states that he was unable to get up from his recliner as he usually would. The patient does endorse that these symptoms were present when he experienced his stroke in April of 2022 but had significantly improved since. On Monday, the patient was seen outpatient for cough and congestion and was treated with a Z-pack. Of note, the patient had COVID in January and since has had a bit of a cough chronically. The patient has had about one month of increased  choking with intake of food and liquids but over the past 3 days, he has reported significant coughing and choking with intake.   ROS: A complete ROS was performed and is negative except as noted in the HPI.   Past Medical History:  Diagnosis Date   Basal cell carcinoma 11/09/2019   nod & infil-behind right ear-cx3 &exc   Basal cell carcinoma 03/21/2020   Residual BCC with peripheral margin involved - ST recommends MOHs   Carotid artery occlusion    Coronary artery disease    s/p CABG 2005; sees Dr Johnsie Cancel yearly   Diabetes mellitus without complication ([place])    Gynecomastia    Hypercholesterolemia    Hypertension    Neurodermatitis    Overweight(278.02)    Personal history of colonic polyps 10/08/2006   tubular adenomas   Renal insufficiency    Stroke [facility])    TIA  April 2022   Transient ischemic attack 2010   "lasted ~ 5 seconds"   Vitamin D deficiency    Past Surgical History:  Procedure Laterality Date   CARDIAC CATHETERIZATION  02/2004   "tried to stent; couldn't"   CAROTID ENDARTERECTOMY Right 08/26/2015   COLONOSCOPY     CORONARY ANGIOPLASTY     CORONARY ARTERY BYPASS GRAFT  Feb. 2005   4 vessel   ENDARTERECTOMY Right 08/26/2015   Procedure: Right Carotid ENDARTERECTOMY with Patch Angioplasty ;  Surgeon: Rosetta Posner, MD;  Location: [place];  Service: Vascular;  Laterality: Right;   ENDARTERECTOMY Left 05/18/2021   Procedure: LEFT CAROTID ARTERY ENDARTERECTOMY  with  patch angioplasty;  Surgeon: Rosetta Posner, MD;  Location: [facility] OR;  Service: Vascular;  Laterality: Left;   EYE SURGERY     bilateral cataract   KELOID EXCISION  04/2008   on chest scar; Dr. Dessie Coma   KELOID EXCISION     PILONIDAL CYST EXCISION  1989   Family History  Problem Relation Age of Onset   Heart disease Mother        Before age 53   Diabetes Mother    Kidney disease Mother    Heart attack Mother 49   Lung cancer Father 67   Diabetes Brother    Heart disease Brother    Heart disease  Brother    Arthritis Brother    Diabetes Sister    Fibromyalgia Sister    Lung cancer Paternal Uncle        questionable as to if it was lung ca   Healthy Daughter    Colon cancer Neg Hx    Stroke Neg Hx    Social History:   reports that he has never smoked. His smokeless tobacco use includes snuff. He reports that he does not drink alcohol and does not use drugs.  Medications  Current Facility-Administered Medications:    LORazepam (ATIVAN) injection 2 mg, 2 mg, Intravenous, Once, Daleen Bo, MD  Current Outpatient Medications:    atorvastatin (LIPITOR) 20 MG tablet, Take 0.5 tablets (10 mg total) by mouth daily. For cholesterol., Disp: 45 tablet, Rfl: 1   azithromycin (ZITHROMAX) 250 MG tablet, Take 2 tablets on day 1, then 1 tablet daily on days 2 through 5, Disp: 6 tablet, Rfl: 0   cholecalciferol (VITAMIN D3) 25 MCG (1000 UNIT) tablet, Take 1,000 Units by mouth daily., Disp: , Rfl:    clopidogrel (PLAVIX) 75 MG tablet, Take 1 tablet (75 mg total) by mouth daily., Disp: 90 tablet, Rfl: 3   hydrochlorothiazide (MICROZIDE) 12.5 MG capsule, Take 1 capsule (12.5 mg total) by mouth daily., Disp: 90 capsule, Rfl: 3   losartan (COZAAR) 25 MG tablet, Take 2 tablets (50 mg total) by mouth daily. For blood pressure., Disp: 180 tablet, Rfl: 1   nitroGLYCERIN (NITROSTAT) 0.4 MG SL tablet, Place 1 tablet (0.4 mg total) under the tongue every 5 (five) minutes as needed for chest pain., Disp: 25 tablet, Rfl: 3   vitamin B-12 (CYANOCOBALAMIN) 1000 MCG tablet, Take 1,000 mcg by mouth daily. Taking 5 days a week, Disp: , Rfl:   Exam: Current vital signs: BP (!) 164/63    Pulse 70    Temp 98 F (36.7 C) (Oral)    Resp (!) 25    Ht 6\' 1"  (1.854 m)    Wt 99.8 kg    SpO2 94%    BMI 29.03 kg/m  Vital signs in last 24 hours: Temp:  [98 F (36.7 C)] 98 F (36.7 C) (03/01 1023) Pulse Rate:  [63-74] 70 (03/01 1445) Resp:  [16-30] 25 (03/01 1445) BP: (135-198)/(53-94) 164/63 (03/01 1445) SpO2:   [94 %-97 %] 94 % (03/01 1445) Weight:  [99.8 kg] 99.8 kg (03/01 1259)  GENERAL: Awake, alert, in no acute distress Psych: Affect appropriate for situation, patient is calm and cooperative with examination Head: Normocephalic and atraumatic, without obvious abnormality EENT: Normal conjunctivae, dry mucous membranes, no OP obstruction LUNGS: Normal respiratory effort. Non-labored breathing on room air CV: Regular rate and rhythm on telemetry ABDOMEN: Soft, non-tender, non-distended Extremities: Warm, well perfused, without obvious deformity  NEURO:  Mental Status: Awake, alert, and  oriented to person, place, time, and situation. He is able to provide a clear and coherent history of present illness. Speech/Language: speech is mildly dysarthric.  Naming, repetition, fluency, and comprehension intact without aphasia. No neglect is noted Cranial Nerves:  II: PERRL 3 mm/brisk. Visual fields full.  III, IV, VI: EOMI without ptosis, nystagmus, or gaze preference.  V: Sensation is intact to light touch and symmetrical to face.  VII: There is mild left mouth droop on exam. VIII: Hearing is intact to voice IX, X: Palate elevation is symmetric. Phonation normal.  XI: Normal sternocleidomastoid and trapezius muscle strength XII: Tongue protrudes midline without fasciculations.   Motor: 5/5 strength present in bilateral upper extremities and right lower extremity. Patient has some weakness with minimal elevation and vertical drift of left lower extremity.  Tone is normal. Bulk is normal.  Sensation: Intact to light touch bilaterally in all four extremities.  Coordination: FTN intact bilaterally. HKS intact bilaterally.  DTRs: 2+ and symmetric throughout.  Gait: Deferred  NIHSS: 1a Level of Conscious.: 0 1b LOC Questions: 0 1c LOC Commands: 0 2 Best Gaze: 0 3 Visual: 0 4 Facial Palsy: 1 5a Motor Arm - left: 0 5b Motor Arm - Right: 0 6a Motor Leg - Left: 1 6b Motor Leg - Right: 0 7 Limb  Ataxia: 0 8 Sensory: 0 9 Best Language: 0 10 Dysarthria: 1 11 Extinct. and Inatten.: 0 TOTAL: 3  Labs I have reviewed labs in epic and the results pertinent to this consultation are: CBC    Component Value Date/Time   WBC 6.5 02/28/2022 1100   RBC 4.30 02/28/2022 1100   HGB 15.1 02/28/2022 1100   HCT 43.4 02/28/2022 1100   PLT 226 02/28/2022 1100   MCV 100.9 (H) 02/28/2022 1100   MCH 35.1 (H) 02/28/2022 1100   MCHC 34.8 02/28/2022 1100   RDW 13.2 02/28/2022 1100   LYMPHSABS 1.2 02/28/2022 1100   MONOABS 0.7 02/28/2022 1100   EOSABS 0.1 02/28/2022 1100   BASOSABS 0.0 02/28/2022 1100   CMP    Component Value Date/Time   NA 138 02/28/2022 1100   NA 143 08/03/2021 0000   K 3.8 02/28/2022 1100   CL 102 02/28/2022 1100   CO2 28 02/28/2022 1100   GLUCOSE 120 (H) 02/28/2022 1100   BUN 15 02/28/2022 1100   BUN 19 08/03/2021 0000   CREATININE 1.14 02/28/2022 1100   CALCIUM 8.8 (L) 02/28/2022 1100   PROT 6.7 02/28/2022 1100   ALBUMIN 3.6 02/28/2022 1100   AST 19 02/28/2022 1100   ALT 17 02/28/2022 1100   ALKPHOS 86 02/28/2022 1100   BILITOT 1.1 02/28/2022 1100   GFRNONAA >60 02/28/2022 1100   GFRAA >60 08/27/2015 0339   Lipid Panel     Component Value Date/Time   CHOL 116 08/15/2021 0734   TRIG 147.0 08/15/2021 0734   HDL 30.50 (L) 08/15/2021 0734   CHOLHDL 4 08/15/2021 0734   VLDL 29.4 08/15/2021 0734   LDLCALC 56 08/15/2021 0734   Lab Results  Component Value Date   HGBA1C 6.6 (H) 08/15/2021   Imaging I have reviewed the images obtained:  CT-scan of the brain 3/1: 1. No acute intracranial pathology. 2. Multiple remote lacunar infarcts on a background of parenchymal volume loss and chronic white matter microangiopathy as above.  MRI brain showed Acute infarct right corona radiata.   Assessment: 81 y.o. male who presented to the ED for evaluation of 2-3 days of dysarthria, increased dysphagia, left lower extremity weakness,  and left facial weakness. Patient  with CVA in April of 2022 with similar presenting symptoms with minimal left-sided residual deficits.  - Examination reveals patient with left lower extremity weakness, minimal dysarthria, and mild left mouth droop with an NIHSS of 3.  - Presentation is concerning for acute ischemia versus recrudescence of previous stroke symptoms. Presentation is also concerning for possible aspiration event with increased dysphagia at home over the past 3 days. - Stroke risk factors include history of CVA, CAD, HTN, HLD, DM2, tobacco use, and advanced age.    Recommendations: - Admit for stroke workup - Infectious work up per EDP / primary team - Speech evaluation with increased dysphagia and choking events reported at home with a failed bedside swallow in the ED  Pt seen by NP/Neuro and later by MD. Note/plan to be edited by MD as needed.  Anibal Henderson, AGAC-NP [facility] Pager: [phone]  Attending Attestation:  Patient seen, examined, labs,vitals and notes reviewed. Agree with assessment and plan as documented above. I have independently reviewed the chart, obtained history, review of systems and examined the patient.  Electronically signed by:  Lynnae Sandhoff, MD Page: [phone] 03/01/2022, 1:08 AM

## 2022-02-28 NOTE — Telephone Encounter (Signed)
Per chart review tab pt is presently at [facility] ED. Sending note to Dr Einar Pheasant who is in office this morning. ?

## 2022-02-28 NOTE — Assessment & Plan Note (Addendum)
Hx of right CEA in 2016  ?Hx of left CEA in 04/2021 by Dr. Donnetta Hutching  ?Continue plavix/statin ?Repeat carotid US ordered  ?

## 2022-02-28 NOTE — ED Notes (Signed)
Med not given  the pt needed an iv started for the med ?

## 2022-02-28 NOTE — Telephone Encounter (Signed)
[facility] - Client ?TELEPHONE ADVICE RECORD ?[facility]? ?Patient ?Name: ?[name] [name] ?RKER ?Gender: Male ?DOB: 08-Oct-1941 ?Age: 81 Y 3 M 19 D ?Return ?Phone ?Number: ?[id number] ?(Primary) ?Address: ?City/ ?State/ ?Zip: ?[place] ? [address] ?Client [place] [facility] - Client ?Client Site [place] - Day ?Provider Alma Friendly - NP ?Contact Type Call ?Who Is Calling Patient / Member / Family / Caregiver ?Call Type Triage / Clinical ?Caller Name Ky Rumple ?Relationship To Patient Spouse ?Return Phone Number [phone] (Primary) ?Chief Complaint SPEAK - sudden inability to talk or slurred speech ?Reason for Call Symptomatic / Request for Health Information ?Initial Comment Caller states her husband had weakness and very ?lethargic and he was not able to get up out of ?the chair on yesterday. Caller states her husband ?having slurred speech when he is speaking with ?her. Caller states her husband was in the office on ?02/26/2022 and he still has a cough and he was ?prescribed an antibiotic medication. ?Translation No ?Nurse Assessment ?Nurse: Terence Lux, RN, Christine Date/Time (Eastern Time): 02/28/2022 8:21:41 AM ?Confirm and document reason for call. If ?symptomatic, describe symptoms. ?---Caller states her husband had weakness and very ?lethargic and he was not able to get up out of the chair ?on yesterday. Caller states her husband having slurred ?speech when he is speaking with her. Caller states ?her husband was in the office on 02/26/2022 and he ?still has a cough and he was prescribed an antibiotic ?medication. ?Does the patient have any new or worsening ?symptoms? ---Yes ?Will a triage be completed? ---Yes ?Related visit to physician within the last 2 weeks? ---Yes ?Does the PT have any chronic conditions? (i.e. ?diabetes, asthma, this includes High risk factors for ?pregnancy, etc.) ?---Yes ?List chronic conditions. ---open  heart sx 2005; dm- stroke last april , carotid ?arteries, ?Is this a behavioral health or substance abuse call? ---No ?PLEASE NOTE: All timestamps contained within this report are represented as [place] Standard Time. ?CONFIDENTIALTY NOTICE: This fax transmission is intended only for the addressee. It contains information that is legally privileged, confidential or ?otherwise protected from use or disclosure. If you are not the intended recipient, you are strictly prohibited from reviewing, disclosing, copying using ?or disseminating any of this information or taking any action in reliance on or regarding this information. If you have received this fax in error, please ?notify us immediately by telephone so that we can arrange for its return to [place]. Phone: [phone], Toll-Free: [phone], Fax: [phone] ?Page: 2 of 2 ?Call Id: [id number] ?Guidelines ?Guideline Title Affirmed Question Affirmed Notes Nurse Date/Time (Eastern ?Time) ?Neurologic Deficit [1] Loss of speech or ?garbled speech AND ?[2] sudden onset ?AND [3] present now ?Terence Lux, RN, ?Christine ?02/28/2022 8:25:32 AM ?Disp. Time (Eastern ?Time) Disposition Final User ?02/28/2022 8:15:32 AM Send to Urgent Henriette Combs ?02/28/2022 8:44:29 AM 911 Outcome Documentation Terence Lux, RN, Altha Harm ?Reason: wife driving to ed ?02/28/2022 8:42:34 AM Call EMS 911 Now Yes Terence Lux, RN, Altha Harm ?Caller Disagree/Comply Disagree ?Caller Understands Yes ?PreDisposition Call Doctor ?Care Advice Given Per Guideline ?CALL EMS 911 NOW: * Immediate medical attention is needed. You need to hang up and call 911 (or an ambulance). * [facility] ?Discretion: I'll call you back in a few minutes to be sure you were able to reach them. CARE ADVICE given per Neurologic Deficit ?(Adult) guideline. ?Referrals ?[place]

## 2022-02-28 NOTE — ED Notes (Signed)
Pt sitting up in a recliner family at  the bedside ?

## 2022-02-28 NOTE — Assessment & Plan Note (Signed)
LDL of 56 in 07/2021 ?Continue lipitor  ?

## 2022-02-28 NOTE — Assessment & Plan Note (Addendum)
Hx of URI with low grade fever and productive cough.  ?Seen at PCP and given azithromycin ?-CXR concerning for worsening consolidation in LLL.  ?Concern for possible aspiration with history of coughing after swallowing and failed bedside swallow screen ?-check procalcitonin ?-blood cultures ordered ?-no WBC, fever or other sepsis/SIRS criteria ?-check respiratory panel  ?-NPO until SLP eval ?-aspiration precautions ?-start rocephin, continue azithromycin, has 2 more days  ?

## 2022-02-28 NOTE — ED Provider Triage Note (Signed)
Emergency Medicine Provider Triage Evaluation Note ? ?[name] [name] , a 81 y.o. male  was evaluated in triage.  Pt complains of slurred speech and worsening left sided weakness. The patient has a history of strokes and TIAs with the last one being in April of 2022. The patient is currently on Plavix.  Daughter reports he is having a harder time walking with his left side.  She reports he has left-sided deficits at baseline, but is noticed them worsening.  She reports he has a small facial droop and has had some trouble with his speech.  Patient currently on Plavix. ? ?Review of Systems  ?Positive: Slurred speech, worsening left-sided deficit ?Negative: Chest pain, Shortness of breath ? ?Physical Exam  ?BP (!) 135/53   Pulse 66   Temp 98 ?F (36.7 ?C) (Oral)   Resp 16   SpO2 95%  ?Gen:   Awake, no distress, slightly slow to speak ?Resp:  Normal effort  ?MSK:   Moves extremities without difficulty, mild left sided deficit lower greater than upper ?Other:  Slight facial droop on the left. Normal finger to nose.  ? ?Medical Decision Making  ?Medically screening exam initiated at 10:55 AM.  Appropriate orders placed.  RONI SCOW was informed that the remainder of the evaluation will be completed by another provider, this initial triage assessment does not replace that evaluation, and the importance of remaining in the ED until their evaluation is complete. ? ?Last known well early yesterday morning. CT head and labs.  ?  ?Sherrell Puller, PA-C ?02/28/22 1102 ? ?

## 2022-02-28 NOTE — ED Triage Notes (Signed)
Pt. Stated, I had a dx of bronchitis and was put on antibiotics. Yesterday morning I tried to get up from my recliner it was not as easy. I also thought I had a stroke in April. I had weakness on the left. Speech per daughter sounds different.  ? ?Last normal was yesterday. ?

## 2022-02-28 NOTE — ED Notes (Signed)
The slurred speech appears to be part of the ativan still he was given earlier  he can be understood ?

## 2022-02-28 NOTE — ED Notes (Signed)
The pt continues to pull off his wires and pulse ox ?

## 2022-02-28 NOTE — ED Provider Notes (Signed)
[facility] EMERGENCY DEPARTMENT Provider Note   CSN: [id number] Arrival date & time: 02/28/22  1013     History  Chief Complaint  Patient presents with   Aphasia   Weakness   Extremity Weakness    [name] [name] is a 81 y.o. male.  HPI Patient presents for evaluation of ongoing difficulty speaking, walking, progressive symptoms over the last 3 days.  The symptoms have included slurred speech, facial asymmetry with left-sided weakness, left leg weakness, and more difficulty walking than usual.  He came here today by private vehicle for evaluation of these conditions.  He was treated earlier this week with a Z-Pak for congestion and cough, and reports that his sputum has improved from yellow to noncolored but still somewhat thick.  He is not having fever, cough, nausea or vomiting.    Home Medications Prior to Admission medications   Medication Sig Start Date End Date Taking? Authorizing Provider  atorvastatin (LIPITOR) 20 MG tablet Take 0.5 tablets (10 mg total) by mouth daily. For cholesterol. 02/13/22   Pleas Koch, NP  azithromycin (ZITHROMAX) 250 MG tablet Take 2 tablets on day 1, then 1 tablet daily on days 2 through 5 02/26/22 03/03/22  Michela Pitcher, NP  cholecalciferol (VITAMIN D3) 25 MCG (1000 UNIT) tablet Take 1,000 Units by mouth daily.    [provider]  clopidogrel (PLAVIX) 75 MG tablet Take 1 tablet (75 mg total) by mouth daily. 04/12/21   Angiulli, Lavon Paganini, PA-C  hydrochlorothiazide (MICROZIDE) 12.5 MG capsule Take 1 capsule (12.5 mg total) by mouth daily. 08/07/21   Josue Hector, MD  losartan (COZAAR) 25 MG tablet Take 2 tablets (50 mg total) by mouth daily. For blood pressure. 12/03/21   Pleas Koch, NP  nitroGLYCERIN (NITROSTAT) 0.4 MG SL tablet Place 1 tablet (0.4 mg total) under the tongue every 5 (five) minutes as needed for chest pain. 07/14/21   Josue Hector, MD  vitamin B-12 (CYANOCOBALAMIN) 1000 MCG tablet Take 1,000  mcg by mouth daily. Taking 5 days a week    [provider]      Allergies    Niacin    Review of Systems   Review of Systems  Physical Exam Updated Vital Signs BP (!) 171/66    Pulse 73    Temp 98 F (36.7 C) (Oral)    Resp (!) 23    Ht 6\' 1"  (1.854 m)    Wt 99.8 kg    SpO2 94%    BMI 29.03 kg/m  Physical Exam Vitals and nursing note reviewed.  Constitutional:      General: He is not in acute distress.    Appearance: He is well-developed. He is not ill-appearing or diaphoretic.  HENT:     Head: Normocephalic and atraumatic.     Right Ear: External ear normal.     Left Ear: External ear normal.     Nose: No congestion.  Eyes:     Conjunctiva/sclera: Conjunctivae normal.     Pupils: Pupils are equal, round, and reactive to light.  Neck:     Trachea: Phonation normal.  Cardiovascular:     Rate and Rhythm: Normal rate and regular rhythm.     Heart sounds: Normal heart sounds.  Pulmonary:     Effort: Pulmonary effort is normal.     Breath sounds: Normal breath sounds.  Abdominal:     Palpations: Abdomen is soft.     Tenderness: There is no abdominal  tenderness.  Musculoskeletal:        General: Normal range of motion.     Cervical back: Normal range of motion and neck supple.  Skin:    General: Skin is warm and dry.  Neurological:     Mental Status: He is alert and oriented to person, place, and time.     Cranial Nerves: No cranial nerve deficit.     Sensory: No sensory deficit.     Motor: No abnormal muscle tone.     Coordination: Coordination normal.  Psychiatric:        Behavior: Behavior normal.        Thought Content: Thought content normal.        Judgment: Judgment normal.    ED Results / Procedures / Treatments   Labs (all labs ordered are listed, but only abnormal results are displayed) Labs Reviewed  CBC WITH DIFFERENTIAL/PLATELET - Abnormal; Notable for the following components:      Result Value   MCV 100.9 (*)    MCH 35.1 (*)    All  other components within normal limits  COMPREHENSIVE METABOLIC PANEL - Abnormal; Notable for the following components:   Glucose, Bld 120 (*)    Calcium 8.8 (*)    All other components within normal limits  URINALYSIS, ROUTINE W REFLEX MICROSCOPIC - Abnormal; Notable for the following components:   Color, Urine STRAW (*)    All other components within normal limits  CBG MONITORING, ED - Abnormal; Notable for the following components:   Glucose-Capillary 119 (*)    All other components within normal limits  RESP PANEL BY RT-PCR (FLU A&B, COVID) ARPGX2  APTT  PROTIME-INR  RAPID URINE DRUG SCREEN, HOSP PERFORMED    EKG EKG Interpretation  Date/Time:  Wednesday February 28 2022 10:28:47 EST Ventricular Rate:  65 PR Interval:  168 QRS Duration: 92 QT Interval:  404 QTC Calculation: 420 R Axis:   90 Text Interpretation: Normal sinus rhythm with sinus arrhythmia Rightward axis Nonspecific ST and T wave abnormality Abnormal ECG When compared with ECG of 03-Apr-2021 04:26, PREVIOUS ECG IS PRESENT since last tracing no significant change Confirmed by Daleen Bo [phone]) on 02/28/2022 2:59:27 PM   EKG Interpretation  Date/Time:  Wednesday February 28 2022 12:53:56 EST Ventricular Rate:  66 PR Interval:  208 QRS Duration: 111 QT Interval:  443 QTC Calculation: 465 R Axis:   91 Text Interpretation: Sinus rhythm Probable inferior infarct, age indeterminate Since last tracing of earlier today No significant change was found Confirmed by Daleen Bo [phone]) on 02/28/2022 3:04:33 PM         Radiology CT Head Wo Contrast  Result Date: 02/28/2022 CLINICAL DATA:  TIA, slurred speech EXAM: CT HEAD WITHOUT CONTRAST TECHNIQUE: Contiguous axial images were obtained from the base of the skull through the vertex without intravenous contrast. RADIATION DOSE REDUCTION: This exam was performed according to the departmental dose-optimization program which includes automated exposure control, adjustment  of the mA and/or kV according to patient size and/or use of iterative reconstruction technique. COMPARISON:  Brain MRI 04/03/2021, CT head 04/03/2021 FINDINGS: Brain: There is no evidence of acute intracranial hemorrhage, extra-axial fluid collection, or acute infarct. There is mild global parenchymal volume loss with prominence of the ventricular system and extra-axial CSF spaces. The ventricles are stable in size compared to the prior CT. There are remote lacunar infarcts in the bilateral basal ganglia, thalami, and corona radiata, and a remote cortical infarct in the high right frontal lobe in  the precentral gyrus, unchanged (3-27). Otherwise, gray-white differentiation is preserved. There is no mass lesion.  There is no mass effect or midline shift. Vascular: There is calcification of the bilateral cavernous ICAs. Skull: Normal. Negative for fracture or focal lesion. Sinuses/Orbits: The imaged paranasal sinuses are clear. Bilateral lens implants are in place. The globes and orbits are otherwise unremarkable. Other: None. IMPRESSION: 1. No acute intracranial pathology. 2. Multiple remote lacunar infarcts on a background of parenchymal volume loss and chronic white matter microangiopathy as above. Electronically Signed   By: Valetta Mole M.D.   On: 02/28/2022 11:47    Procedures Procedures    Medications Ordered in ED Medications - No data to display  ED Course/ Medical Decision Making/ A&P Clinical Course as of 02/28/22 1459  Wed Feb 28, 2022  1218 Case discussed with neuro hospitalist regarding probable recent stroke, not visualized on CT imaging.  He recommends MRI.  I explained that the patient is reluctant to get that without sedation.  At that point the neurologist stated he would evaluate the patient, when he can, to determine the appropriate course of action. [EW]  7408 He remained stable.  He would like to walk to the bathroom to urinate.  Awaiting neurology evaluation to ensure appropriate  MRI testing ordered prior to giving sedation for advanced imaging [EW]    Clinical Course User Index [EW] Daleen Bo, MD                           Medical Decision Making Patient presenting for recurrent and progressive symptoms, left-sided, with history of prior right-sided thalamic stroke and lacunar infarcts.  Problems Addressed: Cerebral infarction, unspecified mechanism ([place]): acute illness or injury    Details: Likely occurring several days ago.  Did not meet clinical criteria for code stroke or acute interventions. Shortness of breath: acute illness or injury    Details: Ongoing symptoms for several days, similar illness to wife, diagnosed with bronchitis  Amount and/or Complexity of Data Reviewed Independent Historian: caregiver    Details: He is a cogent historian, his wife and daughter, both give additional information. External Data Reviewed: notes.    Details: Seen by PCP, 3 days ago and started on Zithromax for cough/bronchitis Labs: ordered.    Details: CBC, metabolic panel, urinalysis, respiratory panel, PT, PTT, urine drug screen Radiology: ordered.    Details: CT head-old lacunar infarcts, no acute abnormalities Discussion of management or test interpretation with external provider(s): Case discussed with neurology, we evaluated the patient together.  He suggested MRI brain, no MRA at this time.  Risk Decision regarding hospitalization. Risk Details: Patient with recurrent symptoms consistent with stroke.  He will require hospitalization further management.  Advanced imaging, MRI ordered to define brain anatomy for acute abnormalities.  Patient does not meet criteria for code stroke.  He will require hospitalization for management and treatment of recurrent symptoms despite being on antiplatelet medication to prevent stroke.  Suspect unassociated bronchitis, currently being treated with Zithromax.  No respiratory distress.  Does not require additional treatment for  bronchitis.  Critical Care Total time providing critical care: 30-74 minutes   Recent neurology evaluation, 10/18/2021, follow-up for stroke earlier in the year-treatment at that time for secondary stroke prevention; Plavix, statin, blood sugar control and blood pressure control.  Evaluation in April with 2022 included CTA head and neck, carotid Dopplers, and laboratory testing.  He had medication management at that time.  Final Clinical Impression(s) / ED Diagnoses Final diagnoses:  Shortness of breath  Cerebral infarction, unspecified mechanism [facility])    Rx / [place] Orders ED Discharge Orders     None         Daleen Bo, MD 02/28/22 1519

## 2022-02-28 NOTE — ED Notes (Signed)
Admitting doctor at  the bedside  the pt is very agitated and keep pulling his wires off. He sleeps intermittently  the family reports that he usually responds to ativan in this way and that it usually wears off in one to two hpurs.  Pts sats are in the high 80s and the low 90s.. nasal 02 at 2 applied ?

## 2022-02-28 NOTE — Assessment & Plan Note (Signed)
-  hx of CABG in 2005 ?-Non ischemic myovue 09/09/18  ?-continue medical Rx   ?

## 2022-03-01 ENCOUNTER — Observation Stay (HOSPITAL_COMMUNITY): Payer: Medicare PPO

## 2022-03-01 ENCOUNTER — Encounter (HOSPITAL_COMMUNITY): Payer: Self-pay | Admitting: Family Medicine

## 2022-03-01 ENCOUNTER — Ambulatory Visit: Payer: Medicare PPO

## 2022-03-01 ENCOUNTER — Other Ambulatory Visit: Payer: Self-pay

## 2022-03-01 DIAGNOSIS — R1310 Dysphagia, unspecified: Secondary | ICD-10-CM

## 2022-03-01 DIAGNOSIS — I6522 Occlusion and stenosis of left carotid artery: Secondary | ICD-10-CM | POA: Diagnosis not present

## 2022-03-01 DIAGNOSIS — G8194 Hemiplegia, unspecified affecting left nondominant side: Secondary | ICD-10-CM | POA: Diagnosis present

## 2022-03-01 DIAGNOSIS — Z20822 Contact with and (suspected) exposure to covid-19: Secondary | ICD-10-CM | POA: Diagnosis present

## 2022-03-01 DIAGNOSIS — R29703 NIHSS score 3: Secondary | ICD-10-CM

## 2022-03-01 DIAGNOSIS — Z8249 Family history of ischemic heart disease and other diseases of the circulatory system: Secondary | ICD-10-CM | POA: Diagnosis not present

## 2022-03-01 DIAGNOSIS — Z86010 Personal history of colonic polyps: Secondary | ICD-10-CM | POA: Diagnosis not present

## 2022-03-01 DIAGNOSIS — I10 Essential (primary) hypertension: Secondary | ICD-10-CM

## 2022-03-01 DIAGNOSIS — J690 Pneumonitis due to inhalation of food and vomit: Secondary | ICD-10-CM | POA: Diagnosis present

## 2022-03-01 DIAGNOSIS — E119 Type 2 diabetes mellitus without complications: Secondary | ICD-10-CM

## 2022-03-01 DIAGNOSIS — R29810 Facial weakness: Secondary | ICD-10-CM | POA: Diagnosis present

## 2022-03-01 DIAGNOSIS — J189 Pneumonia, unspecified organism: Secondary | ICD-10-CM | POA: Diagnosis present

## 2022-03-01 DIAGNOSIS — I6389 Other cerebral infarction: Secondary | ICD-10-CM

## 2022-03-01 DIAGNOSIS — E7849 Other hyperlipidemia: Secondary | ICD-10-CM

## 2022-03-01 DIAGNOSIS — R471 Dysarthria and anarthria: Secondary | ICD-10-CM | POA: Diagnosis present

## 2022-03-01 DIAGNOSIS — M4802 Spinal stenosis, cervical region: Secondary | ICD-10-CM | POA: Diagnosis not present

## 2022-03-01 DIAGNOSIS — M47812 Spondylosis without myelopathy or radiculopathy, cervical region: Secondary | ICD-10-CM | POA: Diagnosis not present

## 2022-03-01 DIAGNOSIS — I2510 Atherosclerotic heart disease of native coronary artery without angina pectoris: Secondary | ICD-10-CM | POA: Diagnosis present

## 2022-03-01 DIAGNOSIS — F17290 Nicotine dependence, other tobacco product, uncomplicated: Secondary | ICD-10-CM | POA: Diagnosis present

## 2022-03-01 DIAGNOSIS — Z833 Family history of diabetes mellitus: Secondary | ICD-10-CM | POA: Diagnosis not present

## 2022-03-01 DIAGNOSIS — Z9861 Coronary angioplasty status: Secondary | ICD-10-CM | POA: Diagnosis not present

## 2022-03-01 DIAGNOSIS — Z85828 Personal history of other malignant neoplasm of skin: Secondary | ICD-10-CM | POA: Diagnosis not present

## 2022-03-01 DIAGNOSIS — Z8673 Personal history of transient ischemic attack (TIA), and cerebral infarction without residual deficits: Secondary | ICD-10-CM | POA: Diagnosis not present

## 2022-03-01 DIAGNOSIS — R2990 Unspecified symptoms and signs involving the nervous system: Secondary | ICD-10-CM | POA: Diagnosis present

## 2022-03-01 DIAGNOSIS — J069 Acute upper respiratory infection, unspecified: Secondary | ICD-10-CM | POA: Diagnosis not present

## 2022-03-01 DIAGNOSIS — E7800 Pure hypercholesterolemia, unspecified: Secondary | ICD-10-CM | POA: Diagnosis present

## 2022-03-01 DIAGNOSIS — R4701 Aphasia: Secondary | ICD-10-CM | POA: Diagnosis present

## 2022-03-01 DIAGNOSIS — I6523 Occlusion and stenosis of bilateral carotid arteries: Secondary | ICD-10-CM | POA: Diagnosis not present

## 2022-03-01 DIAGNOSIS — I6503 Occlusion and stenosis of bilateral vertebral arteries: Secondary | ICD-10-CM | POA: Diagnosis not present

## 2022-03-01 DIAGNOSIS — Z951 Presence of aortocoronary bypass graft: Secondary | ICD-10-CM | POA: Diagnosis not present

## 2022-03-01 DIAGNOSIS — E559 Vitamin D deficiency, unspecified: Secondary | ICD-10-CM | POA: Diagnosis present

## 2022-03-01 DIAGNOSIS — Z8616 Personal history of COVID-19: Secondary | ICD-10-CM | POA: Diagnosis not present

## 2022-03-01 DIAGNOSIS — I639 Cerebral infarction, unspecified: Secondary | ICD-10-CM | POA: Diagnosis present

## 2022-03-01 LAB — GLUCOSE, CAPILLARY
Glucose-Capillary: 119 mg/dL — ABNORMAL HIGH (ref 70–99)
Glucose-Capillary: 118 mg/dL — ABNORMAL HIGH (ref 70–99)

## 2022-03-01 LAB — RESPIRATORY PANEL BY PCR

## 2022-03-01 LAB — HEMOGLOBIN A1C
Hgb A1c MFr Bld: 6.1 % — ABNORMAL HIGH (ref 4.8–5.6)
Mean Plasma Glucose: 128.37 mg/dL

## 2022-03-01 LAB — ECHOCARDIOGRAM COMPLETE
AR max vel: 2.85 cm2
AV Area VTI: 2.88 cm2
AV Area mean vel: 2.58 cm2
AV Mean grad: 3 mmHg
AV Peak grad: 6.2 mmHg
Ao pk vel: 1.24 m/s
Area-P 1/2: 3.77 cm2
Height: 73 in
S' Lateral: 3.6 cm
Weight: 3520 oz

## 2022-03-01 LAB — CBG MONITORING, ED
Glucose-Capillary: 102 mg/dL — ABNORMAL HIGH (ref 70–99)
Glucose-Capillary: 109 mg/dL — ABNORMAL HIGH (ref 70–99)
Glucose-Capillary: 114 mg/dL — ABNORMAL HIGH (ref 70–99)
Glucose-Capillary: 141 mg/dL — ABNORMAL HIGH (ref 70–99)

## 2022-03-01 LAB — LIPID PANEL
Cholesterol: 105 mg/dL (ref 0–200)
HDL: 26 mg/dL — ABNORMAL LOW (ref >40)
LDL Cholesterol: 55 mg/dL (ref 0–99)
Total CHOL/HDL Ratio: 4 RATIO
Triglycerides: 120 mg/dL (ref <150)
VLDL: 24 mg/dL (ref 0–40)

## 2022-03-01 LAB — PROCALCITONIN: Procalcitonin: <0.1 ng/mL

## 2022-03-01 MED ORDER — IOHEXOL 350 MG/ML SOLN
100.0000 mL | Freq: Once | INTRAVENOUS | Status: AC | PRN
  Administered 2022-03-01: 75 mL via INTRA_ARTERIAL

## 2022-03-01 MED ORDER — ATORVASTATIN CALCIUM 10 MG PO TABS
10.0000 mg | ORAL_TABLET | Freq: Every day | ORAL | Status: DC
  Administered 2022-03-02 – 2022-03-04 (×3): 10 mg via ORAL
  Filled 2022-03-01 (×3): qty 1

## 2022-03-01 NOTE — Evaluation (Signed)
Occupational Therapy Evaluation Patient Details Name: [name] [name] MRN: [id number] DOB: 08-Apr-1941 Today's Date: 03/01/2022   History of Present Illness Pt is a 81 yo male presenting to ED 02/28/22 with slurred speech, L sided weakness, and L facial droop that has progressively worsened over the past several days. Imaging show R corona radiata CVA. CXR with worsening consolidation in LLL and concern for aspiration PNA. 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   Clinical Impression   PTA patient independent with mobility using cane, a little help for LB ADLS (donning socks and depends).  Pt admitted for above and presents with problem list below, including L sided weakness, mild decreased coordination and inattention, impaired balance, and impaired cognition (attention, awareness, problem solving, and safety). Pt currently requires min assist for bed mobility, mod assist +2 for transfers and limited mobility in room with bilateral hand held support, min to max assist for ADLs. Based on performance today, pt will benefit from continued OT services while admitted and after dc at CIR level to optimize independence, safety and return to PLOF.  Will follow.      Recommendations for follow up therapy are one component of a multi-disciplinary discharge planning process, led by the attending physician.  Recommendations may be updated based on patient status, additional functional criteria and insurance authorization.   Follow Up Recommendations  Acute inpatient rehab (3hours/day)    Assistance Recommended at Discharge Frequent or constant Supervision/Assistance  Patient can return home with the following A lot of help with bathing/dressing/bathroom;Two people to help with walking and/or transfers;Assistance with cooking/housework;Direct supervision/assist for medications management;Direct  supervision/assist for financial management;Assist for transportation;Help with stairs or ramp for entrance    Functional Status Assessment  Patient has had a recent decline in their functional status and demonstrates the ability to make significant improvements in function in a reasonable and predictable amount of time.  Equipment Recommendations  Other (comment) (defer)    Recommendations for Other Services Rehab consult;Speech consult     Precautions / Restrictions Precautions Precautions: Fall Restrictions Weight Bearing Restrictions: No      Mobility Bed Mobility Overal bed mobility: Needs Assistance Bed Mobility: Supine to Sit, Sit to Supine     Supine to sit: Min assist Sit to supine: Min assist   General bed mobility comments: min assist for trunk support to ascend, cueing for hand placement and sequencing, as well as min assist to scoot forward    Transfers                          Balance Overall balance assessment: Needs assistance Sitting-balance support: Feet supported, No upper extremity supported Sitting balance-Leahy Scale: Poor Sitting balance - Comments: pt required min guard to min A for trunk support as sat EOB, posterior lean dynamically Postural control: Posterior lean Standing balance support: During functional activity, Bilateral upper extremity supported Standing balance-Leahy Scale: Poor Standing balance comment: relies on BUE and external support                           ADL either performed or assessed with clinical judgement   ADL Overall ADL's : Needs assistance/impaired     Grooming: Minimal assistance;Sitting           Upper Body Dressing : Minimal assistance;Sitting   Lower Body Dressing: Maximal assistance;+2 for  physical assistance;+2 for safety/equipment   Toilet Transfer: Moderate assistance;+2 for physical assistance;+2 for safety/equipment   Toileting- Clothing Manipulation and Hygiene: Sit  to/from stand;Maximal assistance Toileting - Clothing Manipulation Details (indicate cue type and reason): using urinal, assist for placement and clothing mgmt     Functional mobility during ADLs: Moderate assistance;+2 for safety/equipment;+2 for physical assistance       Vision   Vision Assessment?: No apparent visual deficits Additional Comments: continue assessment     Perception     Praxis      Pertinent Vitals/Pain Pain Assessment Pain Assessment: No/denies pain     Hand Dominance Left   Extremity/Trunk Assessment Upper Extremity Assessment Upper Extremity Assessment: LUE deficits/detail LUE Deficits / Details: pt with L sided sensation deficits since prior CVA (digits 2-3); grossly weaker 3+/5 MMT LUE Sensation: decreased light touch LUE Coordination: decreased fine motor   Lower Extremity Assessment Lower Extremity Assessment: Defer to PT evaluation LLE Deficits / Details: L knee buckling noted with gait; sensation intact; ROM intact, coordination normal with heel to shin testing, 4/5 with LLE MMTs. LLE Sensation: WNL LLE Coordination: WNL   Cervical / Trunk Assessment Cervical / Trunk Assessment: Kyphotic (forward head posture)   Communication Communication Communication: Other (comment);Expressive difficulties (slurred speech)   Cognition Arousal/Alertness: Awake/alert Behavior During Therapy: WFL for tasks assessed/performed Overall Cognitive Status: Impaired/Different from baseline Area of Impairment: Attention, Safety/judgement, Awareness, Problem solving, Following commands                   Current Attention Level: Sustained   Following Commands: Follows one step commands consistently, Follows one step commands with increased time, Follows multi-step commands inconsistently Safety/Judgement: Decreased awareness of safety, Decreased awareness of deficits Awareness: Emergent Problem Solving: Slow processing, Difficulty sequencing, Requires  verbal cues General Comments: pt oriented, follows simple commands but noted decreased attention, sequencing and problem solving.  some L inattention.     General Comments  spouse and daugther present, pt on 2L [place] (desaturated to 87% on RA, replaced O2 to 2L)    Exercises     Shoulder Instructions      Home Living Family/patient expects to be discharged to:: Private residence Living Arrangements: Spouse/significant other Available Help at Discharge: Family;Available 24 hours/day Type of Home: House Home Access: Stairs to enter [employer] of Steps: 3 Entrance Stairs-Rails: Right Home Layout: One level     Bathroom Shower/Tub: Teacher, early years/pre: Handicapped height Bathroom Accessibility: Yes   Home Equipment: Cane - single point;Grab bars - toilet;Grab bars - tub/shower;Rolling Walker (2 wheels);Shower seat          Prior Functioning/Environment Prior Level of Function : Needs assist             Mobility Comments: uses cane in community, furniture walker at home; has been in outpt OT for 2 weeks since COVID ADLs Comments: requires assist for socks and depends, otherwise independent        OT Problem List: Decreased strength;Decreased activity tolerance;Impaired balance (sitting and/or standing);Impaired vision/perception;Decreased coordination;Decreased cognition;Decreased safety awareness;Decreased knowledge of use of DME or AE;Decreased knowledge of precautions;Cardiopulmonary status limiting activity;Impaired UE functional use      OT Treatment/Interventions: Self-care/ADL training;Therapeutic exercise;DME and/or AE instruction;Therapeutic activities;Cognitive remediation/compensation;Patient/family education;Balance training;Visual/perceptual remediation/compensation;Neuromuscular education    OT Goals(Current goals can be found in the care plan section) Acute Rehab OT Goals Patient Stated Goal: get better, home OT Goal Formulation:  With patient Time For Goal Achievement: 03/15/22 Potential to Achieve Goals:  Good  OT Frequency: Min 2X/week    Co-evaluation PT/OT/SLP Co-Evaluation/Treatment: Yes Reason for Co-Treatment: For patient/therapist safety;To address functional/ADL transfers PT goals addressed during session: Balance;Mobility/safety with mobility;Proper use of DME;Strengthening/ROM OT goals addressed during session: ADL's and self-care      AM-PAC OT "6 Clicks" Daily Activity     Outcome Measure Help from another person eating meals?: Total Help from another person taking care of personal grooming?: A Little Help from another person toileting, which includes using toliet, bedpan, or urinal?: A Lot Help from another person bathing (including washing, rinsing, drying)?: A Lot Help from another person to put on and taking off regular upper body clothing?: A Little Help from another person to put on and taking off regular lower body clothing?: A Lot 6 Click Score: 13   End of Session Equipment Utilized During Treatment: Oxygen (2L) Nurse Communication: Mobility status;Other (comment) (needs condom cath- came off)  Activity Tolerance: Patient tolerated treatment well Patient left: in bed;with bed alarm set;with family/visitor present;with call bell/phone within reach  OT Visit Diagnosis: Other abnormalities of gait and mobility (R26.89);Muscle weakness (generalized) (M62.81);Hemiplegia and hemiparesis;Cognitive communication deficit (R41.841);Other symptoms and signs involving cognitive function Symptoms and signs involving cognitive functions: Cerebral infarction Hemiplegia - Right/Left: Left Hemiplegia - dominant/non-dominant: Dominant Hemiplegia - caused by: Cerebral infarction                Time: 3151-7616 OT Time Calculation (min): 31 min Charges:  OT General Charges $OT Visit: 1 Visit OT Evaluation $OT Eval Moderate Complexity: 1 Mod  Jolaine Artist, OT Acute Rehabilitation Services Pager  [phone] Office [phone]   Delight Stare 03/01/2022, 1:11 PM

## 2022-03-01 NOTE — Progress Notes (Signed)
? ?  Inpatient Rehab Admissions Coordinator : ? ?Per therapy recommendations, patient was screened for CIR candidacy by Jamilah Jean RN MSN.  At this time patient appears to be a potential candidate for CIR. I will place a rehab consult per protocol for full assessment. Please call me with any questions. ? ?Lethia Donlon RN MSN ?Admissions Coordinator ?[phone] ?  ?

## 2022-03-01 NOTE — ED Notes (Signed)
The pts wife cell number [phone] wifes home number  [phone] ?

## 2022-03-01 NOTE — ED Notes (Signed)
Pt awake not coughing no pain anywhere he appeared to have a restful night sleeping ?

## 2022-03-01 NOTE — ED Notes (Signed)
Patient transported to X-ray 

## 2022-03-01 NOTE — Progress Notes (Signed)
Modified Barium Swallow Progress Note ? ?Patient Details  ?Name: [name] [name] ?MRN: [id number] ?Date of Birth: [birth year] ? ?Today's Date: 03/01/2022 ? ?Modified Barium Swallow completed.  Full report located under Chart Review in the Imaging Section. ? ?Brief recommendations include the following: ? ?Clinical Impression ? Oral deficits were mild and marked by lingual residue and reduced coordination. Aspiration and mildly delayed swallow onset characterize the pharyngeal phase. Delayed initiation was short in duration and inconsistent with thin but contributed to penetration and aspiration (PAS 2,3,5,8). When he consumed small sips, tucked his chin this this prevented airway intrusion for majority of trials. On two occasions there was penetration to vocal cords and once slipped below the cords. [name] also coughed hard prior to aspiration trial when airway was free of penetrates/aspirates. It is recommended he initiate a regular texture, thin liquids tucking his chin with liquids, pills whole in puree, no mixing consistencies orally and small sips. Therapist will follow. ?  ?Swallow Evaluation Recommendations ? ?   ? ? SLP Diet Recommendations: Regular solids;Thin liquid ? ? Liquid Administration via: Cup ? ? Medication Administration: Whole meds with puree ? ? Supervision: Patient able to self feed;Intermittent supervision to cue for compensatory strategies ? ? Compensations: Minimize environmental distractions;Slow rate;Small sips/bites ? ? Postural Changes: Seated upright at 90 degrees ? ? Oral Care Recommendations: Oral care BID ? ?   ? ? ? ?[name] ?03/01/2022,3:36 PM ?

## 2022-03-01 NOTE — Progress Notes (Signed)
?  Transition of Care (TOC) Screening Note ? ? ?Patient Details  ?Name: [name] [name] ?Date of Birth: Dec 26, 1941 ? ? ?Transition of Care (TOC) CM/SW Contact:    ?Pollie Friar, RN ?Phone Number: ?03/01/2022, 3:14 PM ? ? ? ?Transition of Care Department [facility]) has reviewed patient. We will continue to monitor patient advancement through interdisciplinary progression rounds. If new patient transition needs arise, please place a TOC consult. ?  ?

## 2022-03-01 NOTE — ED Notes (Signed)
Report given to Paige,RN.

## 2022-03-01 NOTE — ED Notes (Signed)
Pt asleep all night  vitals good ?

## 2022-03-01 NOTE — Plan of Care (Signed)

## 2022-03-01 NOTE — Progress Notes (Signed)
?PROGRESS NOTE ? ?[name] [name]  ?DOB: 06-03-1941  ?PCP: Pleas Koch, NP ?TDV:[id number]  ?DOA: 02/28/2022 ? LOS: 0 days  ?Hospital Day: 2 ? ?Brief narrative: ?[name] [name] is a 81 y.o. male with PMH significant for thalamic stroke in 03/2021, HTN, HLD, T2DM, CAD s/p CABG 2005, carotid artery disease s/p bl CEA. ?Patient was brought to the ED from home with new weakness in the left side, slurred speech for more than 24 hours. ?Patient saw his PCP on Monday 2/27 for low-grade fever, coughing and was treated with Z-Pak. ?  ?In the ED, patient was afebrile, blood pressure 135/53, heart rate 66, breathing on room air.   ?CT head did not show an acute finding but showed multiple remote lacunar infarcts on background of parenchymal volume loss and chronic white matter microangiopathy.  ? ?Code stroke was called.  Seen by neurology. ?Admitted to hospitalist service ? ?Subjective: ?Patient was seen and examined this morning.  Pleasant elderly Caucasian male.  Lying down in bed.  Not in distress.  No new symptoms. ?Family at bedside.  Family noticed that his weakness improved yesterday while in the ED. ? ?Principal Problem: ?  Acute cerebrovascular accident (CVA) ([place]) ?Active Problems: ?  Essential hypertension ?  Coronary atherosclerosis ?  Type 2 diabetes mellitus ([place]) ?  Carotid artery disease ([place]) ?  Hyperlipidemia ?  Aspiration pneumonia vs. CAP ?  Stroke-like symptoms ?  ? ?Assessment and Plan: ?Acute cerebrovascular accident  ?Right coronary radiata infarct ?Multiple remote lacunar infarcts ?Chronic white matter microangiopathy ?-Presented with acute left-sided weakness, facial droop and slurred speech.   ?-History of thalamic stroke in 03/2021.   ?-CT scan of head did not show any acute finding.   ?-MRI brain showed acute right coronary to infarct ?-CTA head and neck did not show any large vessel occlusion or stenosis ?-LDL 55, A1c 6.1 ?-Neurology consult pending. ?-Echocardiogram pending. ?-Prior to  admission, patient was on Plavix 75 mg daily.  Currently remains on the same. ?-Pending PT/OT eval ? ?Aspiration pneumonia vs. CAP ?-Hx of URI with low grade fever and productive cough.  ?-Recently seen at PCP and given azithromycin ?-CXR concerning for worsening consolidation in LLL.  ?-Concern for possible aspiration with history of coughing after swallowing and failed bedside swallow screen ?-No fever, WC count normal, procalcitonin level normal abnormal chest x-ray ?-blood cultures sent. ?-Currently on IV antibiotic with IV Rocephin and IV azithromycin. ?-Continue empiric antibiotics for now. ?-Speech therapy eval. Aspiration precautions ?Recent Labs  ?Lab 02/28/22 ?1100 02/28/22 ?2102 03/01/22 ?0615  ?WBC 6.5  --   --   ?PROCALCITON  --  <0.10 <0.10  ? ?Essential hypertension ?-Home meds include HCTZ 12.5 mg daily, losartan 50 mg daily  ?-Currently on hold for permissive hypertension. ? ?Carotid artery disease s/p bl CEA ?CAD/CABG in 2005 ?Hyperlipidemia ?-Nonischemic Myoview in 2019. ?-Continue plavix/statin ?-LDL 55 ? ?Type 2 diabetes mellitus ?-A1c 6.1 on 03/01/2022 ?-Currently on sliding scale insulin with Accu-Cheks ?-Blood sugar level consistently less than 150. ?Recent Labs  ?Lab 02/28/22 ?1031 03/01/22 ?0626 03/01/22 ?0444 03/01/22 ?9485 03/01/22 ?1206  ?GLUCAP 119* 102* 109* 114* 141*  ? ?Goals of care ?  Code Status: Full Code  ? ? ?Mobility: PT eval pending ? ?Nutritional status:  ?Body mass index is 29.03 kg/m?.  ?  ?  ? ? ? ? ?Diet:  ?Diet Order   ? ?       ?  Diet NPO time specified  Diet effective now       ?  ? ?  ?  ? ?  ? ? ?  DVT prophylaxis:  ?enoxaparin (LOVENOX) injection 40 mg Start: 02/28/22 2015 ?  ?Antimicrobials: Rocephin, azithromycin ?Fluid: None ?Consultants: Neurology ?Family Communication: Daughter was at bedside ? ?Status is: Patient ? ?Continue in-hospital care because: Pending stroke work-up,  ?Level of care: Telemetry Medical  ? ?Dispo: The patient is from: Home  ?              Anticipated d/c is to: Pending clinical course ?             Patient currently is not medically stable to d/c. ?  Difficult to place patient No ? ? ? ? ?Infusions:  ? azithromycin Stopped (03/01/22 0929)  ? cefTRIAXone (ROCEPHIN)  IV Stopped (02/28/22 2159)  ? ? ?Scheduled Meds: ? atorvastatin  10 mg Oral Daily  ? enoxaparin (LOVENOX) injection  40 mg Subcutaneous Q24H  ? insulin aspart  0-9 Units Subcutaneous TID WC  ? ? ?PRN meds: ?acetaminophen **OR** acetaminophen (TYLENOL) oral liquid 160 mg/5 mL **OR** acetaminophen, hydrALAZINE  ? ?Antimicrobials: ?Anti-infectives (From admission, onward)  ? ? Start     Dose/Rate Route Frequency Ordered Stop  ? 03/01/22 0800  azithromycin (ZITHROMAX) 500 mg in sodium chloride 0.9 % 250 mL IVPB       ? 500 mg ?250 mL/hr over 60 Minutes Intravenous Every 24 hours 02/28/22 2104 03/03/22 0759  ? 02/28/22 1915  cefTRIAXone (ROCEPHIN) 1 g in sodium chloride 0.9 % 100 mL IVPB       ? 1 g ?200 mL/hr over 30 Minutes Intravenous Every 24 hours 02/28/22 1908    ? ?  ? ? ?Objective: ?Vitals:  ? 03/01/22 1300 03/01/22 1330  ?BP: (!) 121/106 (!) 156/65  ?Pulse: 68 69  ?Resp: 20 (!) 32  ?Temp:    ?SpO2: 96% 91%  ? ? ?Intake/Output Summary (Last 24 hours) at 03/01/2022 1415 ?Last data filed at 03/01/2022 0086 ?Gross per 24 hour  ?Intake 202.88 ml  ?Output --  ?Net 202.88 ml  ? ?Filed Weights  ? 02/28/22 1259  ?Weight: 99.8 kg  ? ?Weight change:  ?Body mass index is 29.03 kg/m?.  ? ?Physical Exam: ?General exam: Pleasant, elderly Caucasian male.  Not in distress ?Skin: No rashes, lesions or ulcers. ?HEENT: Atraumatic, normocephalic, no obvious bleeding ?Lungs: Clear to auscultation bilaterally. ?CVS: Regular rate and rhythm, no murmur ?GI/Abd soft, nontender, nondistended, bowel sound present ?CNS: Alert, awake, oriented time 3, slurred speech seems to be improving. ?Psychiatry: Mood appropriate ?Extremities: No pedal edema, no calf tenderness ? ?Data Review: I have personally reviewed the  laboratory data and studies available. ? ?F/u labs ordered ?Unresulted Labs (From admission, onward)  ? ?  Start     Ordered  ? 03/02/22 0500  CBC with Differential/Platelet  Tomorrow morning,   R       ? 03/01/22 1415  ? 03/02/22 7619  Basic metabolic panel  Tomorrow morning,   R       ? 03/01/22 1415  ? 03/01/22 0500  Procalcitonin  Daily,   R     ? 02/28/22 1908  ? 02/28/22 1909  Respiratory (~20 pathogens) panel by PCR  (Respiratory panel by PCR (~20 pathogens, ~24 hr TAT)  w precautions)  Once,   R       ? 02/28/22 1908  ? 02/28/22 1908  Culture, blood (routine x 2)  BLOOD CULTURE X 2,   R     ? 02/28/22 1908  ? ?  ?  ? ?  ? ? ?Signed, ?  Terrilee Croak, MD ?[facility] ?03/01/2022 ? ? ? ? ? ? ? ? ? ? ?  ?

## 2022-03-01 NOTE — Progress Notes (Addendum)
STROKE TEAM PROGRESS NOTE   INTERVAL HISTORY His wife and daughter is at the bedside. Patient had just received ativan per daughter, which is causing his slurred speech. Daughter reported that patient complained of difficulty getting out of recliner and was dysarthric. Dysarthria resolved the next day, but then L face droop developed, which resolved later that night. Quit smoking Sunday prior to admission, used to chew. Per family, patient had COVID in January and had been weak ever since.   Vitals:   03/01/22 1130 03/01/22 1200 03/01/22 1230 03/01/22 1300  BP: 131/88 (!) 142/61 133/63 (!) 121/106  Pulse: 69 68 68 68  Resp: (!) 25 (!) 22 (!) 21 20  Temp:      TempSrc:      SpO2: 93% 95% 94% 96%  Weight:      Height:       CBC:  Recent Labs  Lab 02/28/22 1100  WBC 6.5  NEUTROABS 4.5  HGB 15.1  HCT 43.4  MCV 100.9*  PLT 594   Basic Metabolic Panel:  Recent Labs  Lab 02/28/22 1100  NA 138  K 3.8  CL 102  CO2 28  GLUCOSE 120*  BUN 15  CREATININE 1.14  CALCIUM 8.8*   Lipid Panel:  Recent Labs  Lab 03/01/22 0615  CHOL 105  TRIG 120  HDL 26*  CHOLHDL 4.0  VLDL 24  LDLCALC 55   HgbA1c:  Recent Labs  Lab 03/01/22 0615  HGBA1C 6.1*    Urine Drug Screen:  Recent Labs  Lab 02/28/22 1229  LABOPIA NONE DETECTED  COCAINSCRNUR NONE DETECTED  LABBENZ NONE DETECTED  AMPHETMU NONE DETECTED  THCU NONE DETECTED  LABBARB NONE DETECTED    Alcohol Level No results for input(s): ETH in the last 168 hours.  IMAGING past 24 hours CT ANGIO HEAD NECK W WO CM  Result Date: 03/01/2022 CLINICAL DATA:  Infarct seen in the right corona radiata EXAM: CT ANGIOGRAPHY HEAD AND NECK TECHNIQUE: Multidetector CT imaging of the head and neck was performed using the standard protocol during bolus administration of intravenous contrast. Multiplanar CT image reconstructions and MIPs were obtained to evaluate the vascular anatomy. Carotid stenosis measurements (when applicable) are  obtained utilizing NASCET criteria, using the distal internal carotid diameter as the denominator. RADIATION DOSE REDUCTION: This exam was performed according to the departmental dose-optimization program which includes automated exposure control, adjustment of the mA and/or kV according to patient size and/or use of iterative reconstruction technique. CONTRAST:  60mL OMNIPAQUE IOHEXOL 350 MG/ML SOLN COMPARISON:  CT head and brain MRI dated 1 day prior, CTA head neck 04/03/2021 FINDINGS: CTA NECK FINDINGS Aortic arch: There is mild calcified atherosclerotic plaque of the aortic arch. The origins of the major branch vessels are patent. The subclavian arteries are patent to the level imaged. Right carotid system: The patient is status post right carotid endarterectomy. The right common, internal, and external carotid arteries are patent, without hemodynamically significant stenosis or occlusion. There is no dissection or aneurysm. There is mild plaque at the bifurcation. Left carotid system: The left common carotid artery is patent with scattered mild calcified plaque but no hemodynamically significant stenosis. There has been interval left carotid endarterectomy since 04/03/2021. There is no residual hemodynamically significant stenosis or occlusion. The left external carotid artery is patent. There is no dissection or aneurysm. Vertebral arteries: There is moderate stenosis at the origin of the right vertebral artery, similar to the prior study. Remainder of the right vertebral artery is widely  patent. There is mild stenosis of the left vertebral artery origin. The left vertebral artery is otherwise widely patent. There is no evidence of dissection or aneurysm. Skeleton: There is mild degenerative change at C5-C6. There is calcification of the nuchal ligament. There is no acute osseous abnormality or aggressive osseous lesion. There is thickening of the posterior longitudinal ligament particularly over the C2  vertebral body/dens resulting in at least moderate spinal canal stenosis, similar to the prior study. Other neck: The soft tissues are otherwise unremarkable. Surgical clips are noted in the left neck Upper chest: Median sternotomy wires and mediastinal surgical clips are noted. Imaged lung apices are clear. Review of the MIP images confirms the above findings CTA HEAD FINDINGS Anterior circulation: There is calcified atherosclerotic plaque of the bilateral intracranial ICAs resulting in up to mild-to-moderate stenosis on the left and no greater than mild stenosis on the right. The bilateral MCAs are patent. The bilateral ACAs are patent. The left A1 segment is hypoplastic or absent, likely developmental variant. There is no aneurysm or AVM. Posterior circulation: The bilateral V4 segments are patent. PICA is identified bilaterally. The basilar artery is patent. The bilateral PCAs are patent. There is a fetal origin of the left PCA, unchanged. A diminutive right posterior communicating artery is also seen. There is no aneurysm or AVM. Venous sinuses: As permitted by contrast timing, patent. Anatomic variants: As above. Review of the MIP images confirms the above findings IMPRESSION: 1. No emergent large vessel occlusion. 2. Interval left carotid endarterectomy since 04/03/2021 with no residual hemodynamically significant stenosis or occlusion. Patient is also status post endarterectomy on the right with no significant stenosis or occlusion. 3. Unchanged moderate right and mild left vertebral artery origin stenosis. The vertebral arteries are otherwise widely patent in the neck. 4. Calcified atherosclerotic plaque in the intracranial ICAs resulting in up to mild-to-moderate stenosis on the left and no greater than mild stenosis on the right. Otherwise, patent intracranial vasculature. 5. Thickening of the posterior longitudinal ligament likely resulting in at least moderate spinal canal stenosis from C2 through C4.  This could be further evaluated with MRI of the cervical spine as indicated. Electronically Signed   By: Valetta Mole M.D.   On: 03/01/2022 11:06   MR BRAIN WO CONTRAST  Result Date: 02/28/2022 CLINICAL DATA:  Acute neuro deficit. Slurred speech, left-sided weakness EXAM: MRI HEAD WITHOUT CONTRAST TECHNIQUE: Multiplanar, multiecho pulse sequences of the brain and surrounding structures were obtained without intravenous contrast. COMPARISON:  CT head 02/28/2022 FINDINGS: Brain: Motion degraded study Acute infarct in the right corona radiata.  No other acute infarct Generalized atrophy. Chronic microvascular ischemic changes in the white matter and basal ganglia bilaterally. Negative for hemorrhage, mass, or hydrocephalus. Vascular: Normal arterial flow voids at the skull base Skull and upper cervical spine: No focal lesion. Sinuses/Orbits: Mild mucosal edema paranasal sinuses. Bilateral cataract extraction Other: None IMPRESSION: Acute infarct right corona radiata. Atrophy and moderate chronic microvascular ischemic change Motion degraded study. Electronically Signed   By: Franchot Gallo M.D.   On: 02/28/2022 18:24   DG CHEST PORT 1 VIEW  Result Date: 02/28/2022 CLINICAL DATA:  Dysphagia. EXAM: PORTABLE CHEST 1 VIEW COMPARISON:  Chest radiograph dated 02/26/2022. FINDINGS: There is shallow inspiration. Cardiomegaly with vascular congestion. Left mid to lower lung field interstitial densities have progressed since the prior radiograph and may represent worsening edema or pneumonia. Right lung base atelectasis. No large pleural effusion. No pneumothorax. Stable cardiomediastinal silhouette. Median sternotomy wires and CABG  vascular clips. No acute osseous pathology. IMPRESSION: 1. Cardiomegaly with vascular congestion. 2. Left mid to lower lung field interstitial densities have progressed since the prior radiograph and may represent worsening edema or pneumonia. Electronically Signed   By: Anner Crete M.D.    On: 02/28/2022 18:53   ECHOCARDIOGRAM COMPLETE  Result Date: 03/01/2022    ECHOCARDIOGRAM REPORT   Patient Name:   JAYSHAWN COLSTON Date of Exam: 03/01/2022 Medical Rec #:  [id number]        Height:       73.0 in Accession #:    [id number]       Weight:       220.0 lb Date of Birth:  [birth year]       BSA:          2.241 m Patient Age:    56 years         BP:           128/66 mmHg Patient Gender: M                HR:           80 bpm. Exam Location:  Inpatient Procedure: 2D Echo Indications:    Stroke  History:        Patient has prior history of Echocardiogram examinations, most                 recent 04/03/2021. CAD, Stroke; Risk Factors:Hypertension and                 Diabetes.  Sonographer:    Arlyss Gandy Referring Phys: [id number] Orma Flaming  Sonographer Comments: Image acquisition challenging due to patient body habitus. IMPRESSIONS  1. Left ventricular ejection fraction, by estimation, is 55 to 60%. The left ventricle has normal function. The left ventricle has no regional wall motion abnormalities. Left ventricular diastolic parameters are consistent with Grade I diastolic dysfunction (impaired relaxation).  2. Right ventricular systolic function is normal. The right ventricular size is normal.  3. The mitral valve is grossly normal. Trivial mitral valve regurgitation.  4. The aortic valve is tricuspid. Aortic valve regurgitation is trivial. Aortic valve sclerosis is present, with no evidence of aortic valve stenosis.  5. The inferior vena cava is dilated in size with >50% respiratory variability, suggesting right atrial pressure of 8 mmHg. Comparison(s): Changes from prior study are noted. 04/03/2021: LVEF 60-65%. FINDINGS  Left Ventricle: Left ventricular ejection fraction, by estimation, is 55 to 60%. The left ventricle has normal function. The left ventricle has no regional wall motion abnormalities. The left ventricular internal cavity size was normal in size. There is  no left ventricular hypertrophy.  Left ventricular diastolic parameters are consistent with Grade I diastolic dysfunction (impaired relaxation). Indeterminate filling pressures. Right Ventricle: The right ventricular size is normal. No increase in right ventricular wall thickness. Right ventricular systolic function is normal. Left Atrium: Left atrial size was normal in size. Right Atrium: Right atrial size was normal in size. Pericardium: There is no evidence of pericardial effusion. Mitral Valve: The mitral valve is grossly normal. Trivial mitral valve regurgitation. Tricuspid Valve: The tricuspid valve is grossly normal. Tricuspid valve regurgitation is trivial. Aortic Valve: The aortic valve is tricuspid. Aortic valve regurgitation is trivial. Aortic valve sclerosis is present, with no evidence of aortic valve stenosis. Aortic valve mean gradient measures 3.0 mmHg. Aortic valve peak gradient measures 6.2 mmHg. Aortic valve area, by VTI measures 2.88 cm. Pulmonic Valve: The pulmonic valve was  normal in structure. Pulmonic valve regurgitation is not visualized. Aorta: The aortic root and ascending aorta are structurally normal, with no evidence of dilitation. Venous: The inferior vena cava is dilated in size with greater than 50% respiratory variability, suggesting right atrial pressure of 8 mmHg. IAS/Shunts: No atrial level shunt detected by color flow Doppler.  LEFT VENTRICLE PLAX 2D LVIDd:         5.10 cm   Diastology LVIDs:         3.60 cm   LV e' medial:    5.11 cm/s LV PW:         0.70 cm   LV E/e' medial:  16.0 LV IVS:        1.00 cm   LV e' lateral:   7.94 cm/s LVOT diam:     2.10 cm   LV E/e' lateral: 10.3 LV SV:         80 LV SV Index:   36 LVOT Area:     3.46 cm  RIGHT VENTRICLE            IVC RV Basal diam:  3.90 cm    IVC diam: 2.20 cm RV Mid diam:    3.10 cm RV S prime:     8.38 cm/s TAPSE (M-mode): 1.2 cm LEFT ATRIUM             Index        RIGHT ATRIUM           Index LA diam:        4.40 cm 1.96 cm/m   RA Area:     16.60 cm  LA Vol (A2C):   64.0 ml 28.56 ml/m  RA Volume:   41.10 ml  18.34 ml/m LA Vol (A4C):   72.2 ml 32.22 ml/m LA Biplane Vol: 69.3 ml 30.93 ml/m  AORTIC VALVE AV Area (Vmax):    2.85 cm AV Area (Vmean):   2.58 cm AV Area (VTI):     2.88 cm AV Vmax:           124.00 cm/s AV Vmean:          85.100 cm/s AV VTI:            0.278 m AV Peak Grad:      6.2 mmHg AV Mean Grad:      3.0 mmHg LVOT Vmax:         102.00 cm/s LVOT Vmean:        63.400 cm/s LVOT VTI:          0.231 m LVOT/AV VTI ratio: 0.83  AORTA Ao Root diam: 2.90 cm MITRAL VALVE MV Area (PHT): 3.77 cm    SHUNTS MV Decel Time: 201 msec    Systemic VTI:  0.23 m MV E velocity: 81.80 cm/s  Systemic Diam: 2.10 cm MV A velocity: 72.80 cm/s MV E/A ratio:  1.12 Lyman Bishop MD Electronically signed by Lyman Bishop MD Signature Date/Time: 03/01/2022/12:13:10 PM    Final     PHYSICAL EXAM  Physical Exam  Constitutional: Appears well-developed and well-nourished.  Cardiovascular: Normal rate and regular rhythm.  Respiratory: Effort normal, non-labored breathing  Neuro: Mental Status: Patient is awake, alert, oriented to person, place, month, year, and situation. Recall 2/3 words. Able to identify objects appropriately  Patient is able to give a clear and coherent history. Speech was slightly dysarthric. (Received ativan prior to encounter) Cranial Nerves: II: Visual Fields are full. Pupils are equal, round, and reactive to  light.   III,IV, VI: EOMI without ptosis or diploplia.  V: Facial sensation is symmetric  VII: Left sided lip drooping VIII: Hearing is intact to voice, mildly hard of hearing XI: Shoulder shrug is symmetric. XII: Tongue protrudes midline without atrophy or fasciculations.  Motor: Tone is normal. Bulk is normal. 5/5 strength was present RUE and RLE 4+/5 strength in LUE and LLE   Sensory: Sensation is symmetric to light touch and temperature in the arms and legs. No extinction to DSS present.  Tingling/numbness in first two  fingers Plantars: Toes are downgoing bilaterally.  Cerebellar: FNF are intact bilaterally   ASSESSMENT/PLAN [name] is a 81 y.o. male with history of thalamic stroke in 03/2021, HTN, HLD, T2DM, CAD s/p CABG 2005, BCC presenting with "left sided weakness/leg weakness and slurred speech Monday night". 2-3 days of dysarthria, increased dysphagia, left lower extremity weakness, and left facial weakness. Patient with CVA in April of 2022 with similar presenting symptoms with minimal left-sided residual deficits.   STROKE:  right corona radiata infarct likely secondary due to occlusion or stenosis source Code Stroke: CT head: No acute finding, multiple remote lacunar infarcts on background of parenchymal volume loss and chronic white matter microangiopathy CTA head & neck: No emergent large vessel occlusion. 2. Interval left carotid endarterectomy since 04/03/2021 with no residual hemodynamically significant stenosis or occlusion. Patient is also status post endarterectomy on the right with no significant stenosis or occlusion. 3. Unchanged moderate right and mild left vertebral artery origin stenosis. The vertebral arteries are otherwise widely patent in the neck. 4. Calcified atherosclerotic plaque in the intracranial ICAs resulting in up to mild-to-moderate stenosis on the left and no greater than mild stenosis on the right. Otherwise, patent intracranial vasculature. 5. Thickening of the posterior longitudinal ligament likely resulting in at least moderate spinal canal stenosis from C2 through C4. This could be further evaluated with MRI of the cervical spine as indicated MRI  Acute infarct right corona radiata. Atrophy and moderate chronic microvascular ischemic change  2D Echo: LVEF 55 to 60%. LV nml function, no regional wall motion abnormalities. LV diastolic parameters are consistent with Grade I diastolic dysfunction (impaired relaxation). RV systolic function is normal, size is  normal. The mitral valve is grossly normal. Trivial mitral valve regurgitation. The aortic valve is tricuspid. Aortic valve regurgitation is trivial. Aortic valve sclerosis is present, with no evidence of aortic valve stenosis. The inferior vena cava is dilated in size with >50% respiratory variability, suggesting right atrial pressure of 8 mmHg. Changes from prior study are noted. 04/03/2021 LDL 55 HgbA1c 6.1 VTE prophylaxis - Enoxaparin (lovenox) injection 40 mg start: 02/28/22 2015    Diet   Diet NPO time specified   clopidogrel 75 mg daily prior to admission, now on clopidogrel 75 mg daily.  Therapy recommendations: Acute inpatient rehab (3hours/day) Disposition:  Pending  Hypertension Home meds: Hydrochlorothiazide 12.5 mg, losartan 25 mg, held  Stable Permissive hypertension (OK if < 220/120) but gradually normalize in 5-7 days Long-term BP goal normotensive  Hyperlipidemia Home meds:  Atorvastatin 20 mg, resumed in hospital LDL 55, goal < 70 High intensity statin not indicated  Continue statin at discharge  Diabetes type II Controlled Home meds:  diet controlled HgbA1c 6.1, goal < 7.0 CBGs Recent Labs    03/01/22 0444 03/01/22 0815 03/01/22 1206  GLUCAP 109* 114* 141*  SSI  Other Stroke Risk Factors Advanced Age >/= 33  Cigarette smoker, advised to stop smoking Obesity, Body mass  index is 29.03 kg/m., BMI >/= 30 associated with increased stroke risk, recommend weight loss, diet and exercise as appropriate  Hx stroke/TIA Coronary artery disease  Hospital day # 0  Signed: Merrily Brittle, DO Resident, PGY-1 [facility] 03/01/2022, 1:27 PM    ATTENDING ATTESTATION:  81 year old gentleman with a ED with dysarthria dysphagia left leg weakness and left facial droop.  History of CVA in April 2022 with minimal residual deficits.  MRI shows acute infarct in the right corona radiata.  Long discussion with family who had many questions about his stroke and  his history of dipping tobacco.  Patient tells me he is ready to quit.  He is also asked about going home however stroke work-up is not complete.  Due to his history of CEA surgery.  Recommend getting CT of the head and neck in lieu of carotid ultrasound.    Update: CTA shows calcified plaque in bilateral intracranial ICAs.  Echo 55 to 60% EF no thrombus.  DAPT therapy for 3 months due to intracranial plaque then Plavix alone.  Therapy recommending rehab inpatient.  Follow-up in the stroke clinic.  Neurology will sign off at this point.  Call with questions.  Dr. Reeves Forth evaluated pt independently, reviewed imaging, chart, labs. Discussed and formulated plan with the resident. Please see note above for details.   Total 36 minutes spent on counseling patient and coordinating care, writing notes and reviewing chart.   Loretha Ure,MD  To contact Stroke Continuity provider, please refer to [email or web address] After hours, contact General Neurology

## 2022-03-01 NOTE — ED Notes (Signed)
Neurology at bedside.

## 2022-03-01 NOTE — ED Notes (Signed)
Echo at bedside

## 2022-03-01 NOTE — Care Management Obs Status (Signed)
MEDICARE OBSERVATION STATUS NOTIFICATION ? ? ?Patient Details  ?Name: [name] [name] ?MRN: [id number] ?Date of Birth: December 02, 1941 ? ? ?Medicare Observation Status Notification Given:  Yes ? ? ? ?Fuller Mandril, RN ?03/01/2022, 1:48 PM ?

## 2022-03-01 NOTE — ED Notes (Signed)
The pt has been asleep for hours vitals good ?

## 2022-03-01 NOTE — Progress Notes (Signed)
Echocardiogram ?2D Echocardiogram has been performed. ? ?[name] [name] ?03/01/2022, 10:14 AM ?

## 2022-03-01 NOTE — Evaluation (Signed)
Clinical/Bedside Swallow Evaluation ?Patient Details  ?Name: [name] [name] ?MRN: [id number] ?Date of Birth: 08/04/1941 ? ?Today's Date: 03/01/2022 ?Time: SLP Start Time (ACUTE ONLY): 9798 SLP Stop Time (ACUTE ONLY): 1116 ?SLP Time Calculation (min) (ACUTE ONLY): 33 min ? ?Past Medical History:  ?Past Medical History:  ?Diagnosis Date  ? Basal cell carcinoma 11/09/2019  ? nod & infil-behind right ear-cx3 &exc  ? Basal cell carcinoma 03/21/2020  ? Residual BCC with peripheral margin involved - ST recommends MOHs  ? Carotid artery occlusion   ? Coronary artery disease   ? s/p CABG 2005; sees Dr Johnsie Cancel yearly  ? Diabetes mellitus without complication ([place])   ? Gynecomastia   ? Hypercholesterolemia   ? Hypertension   ? Neurodermatitis   ? Overweight(278.02)   ? Personal history of colonic polyps 10/08/2006  ? tubular adenomas  ? Renal insufficiency   ? Stroke [facility]   ? TIA  April 2022  ? Transient ischemic attack 2010  ? "lasted ~ 5 seconds"  ? Vitamin D deficiency   ? ?Past Surgical History:  ?Past Surgical History:  ?Procedure Laterality Date  ? CARDIAC CATHETERIZATION  02/2004  ? "tried to stent; couldn't"  ? CAROTID ENDARTERECTOMY Right 08/26/2015  ? COLONOSCOPY    ? CORONARY ANGIOPLASTY    ? CORONARY ARTERY BYPASS GRAFT  Feb. 2005  ? 4 vessel  ? ENDARTERECTOMY Right 08/26/2015  ? Procedure: Right Carotid ENDARTERECTOMY with Patch Angioplasty ;  Surgeon: Rosetta Posner, MD;  Location: [place];  Service: Vascular;  Laterality: Right;  ? ENDARTERECTOMY Left 05/18/2021  ? Procedure: LEFT CAROTID ARTERY ENDARTERECTOMY  with patch angioplasty;  Surgeon: Rosetta Posner, MD;  Location: [place];  Service: Vascular;  Laterality: Left;  ? EYE SURGERY    ? bilateral cataract  ? KELOID EXCISION  04/2008  ? on chest scar; Dr. Dessie Coma  ? KELOID EXCISION    ? PILONIDAL CYST EXCISION  1989  ? ?HPI:  ?SAEL FURCHES is a 81 y.o. male with medical history significant of hx of thalamic stroke in 03/2021, HTN, HLD, T2DM, CAD s/p CABG 2005,  carotid artery disease who presented to ED with left sided weakness/leg weakness and slurred speech.  Pt coughing and choking with eating that has been worse over the past 2-3 days. MRI Acute infarct right corona radiata.  ?  ?Assessment / Plan / Recommendation  ?Clinical Impression ? Pt's overt coughing immediately and delayed following thin and nectar thick liquids, is concerning for aspiration. This has been happening at home past month and worsened over several days. He is dysarthric with mild left CN VII impairments. Volitional cough is strong with natural dentition. He may have floor stock purees until MBS today at 1400. ?SLP Visit Diagnosis: Dysphagia, unspecified (R13.10) ?   ?Aspiration Risk ? Mild aspiration risk  ?  ?Diet Recommendation Other (Comment) (floor stock puree)  ? ?   ?  ?Other  Recommendations Oral Care Recommendations: Oral care BID   ? ?Recommendations for follow up therapy are one component of a multi-disciplinary discharge planning process, led by the attending physician.  Recommendations may be updated based on patient status, additional functional criteria and insurance authorization. ? ?Follow up Recommendations    ? ? ?  ?Assistance Recommended at Discharge    ?Functional Status Assessment    ?Frequency and Duration    ?  ?  ?   ? ?Prognosis Prognosis for Safe Diet Advancement: Good  ? ?  ? ?Swallow Study   ?  General Date of Onset: 02/28/22 ?HPI: [name] is a 81 y.o. male with medical history significant of hx of thalamic stroke in 03/2021, HTN, HLD, T2DM, CAD s/p CABG 2005, carotid artery disease who presented to ED with left sided weakness/leg weakness and slurred speech.  Pt coughing and choking with eating that has been worse over the past 2-3 days. MRI Acute infarct right corona radiata. ?Type of Study: Bedside Swallow Evaluation ?Previous Swallow Assessment:  (no) ?Diet Prior to this Study: NPO ?Temperature Spikes Noted: No ?Respiratory Status: Room air ?History of Recent  Intubation: No ?Behavior/Cognition: Alert;Cooperative;Pleasant mood ?Oral Cavity Assessment: Within Functional Limits ?Oral Care Completed by SLP: No ?Oral Cavity - Dentition: Adequate natural dentition ?Vision: Functional for self-feeding ?Self-Feeding Abilities: Able to feed self ?Patient Positioning: Upright in bed ?Baseline Vocal Quality: Normal ?Volitional Cough: Strong ?Volitional Swallow: Able to elicit  ?  ?Oral/Motor/Sensory Function Overall Oral Motor/Sensory Function: Mild impairment ?Facial ROM: Reduced left;Suspected CN VII (facial) dysfunction ?Facial Symmetry: Abnormal symmetry left;Suspected CN VII (facial) dysfunction ?Facial Strength: Reduced left;Suspected CN VII (facial) dysfunction ?Lingual ROM: Within Functional Limits ?Lingual Symmetry: Within Functional Limits ?Mandible: Within Functional Limits   ?Ice Chips Ice chips: Not tested   ?Thin Liquid Thin Liquid: Impaired ?Presentation: Cup ?Pharyngeal  Phase Impairments: Cough - Immediate;Cough - Delayed  ?  ?Nectar Thick Nectar Thick Liquid: Impaired ?Presentation: Cup ?Pharyngeal Phase Impairments: Cough - Immediate;Cough - Delayed   ?Honey Thick Honey Thick Liquid: Not tested   ?Puree Puree: Within functional limits   ?Solid ? ? ?  Solid: Not tested  ? ?  ? ?[name] ?03/01/2022,1:20 PM ? ? ? ? ?

## 2022-03-01 NOTE — Evaluation (Signed)
Physical Therapy Evaluation Patient Details Name: [name] [name] MRN: [id number] DOB: Aug 14, 1941 Today's Date: 03/01/2022  History of Present Illness  Pt is a 81 yo male presenting to ED 02/28/22 with slurred speech, L sided weakness, and L facial droop that has progressively worsened over the past several days. Imaging show R corona radiata CVA. CXR with worsening consolidation in LLL and concern for aspiration PNA. 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   Clinical Impression  Patient presented to the ED on 02/28/22 with slurred speech, L sided weakness and facial droop consistent with patient's diagnosis of R corona radiata CVA. Pt's impairments include decreased coordination, sequencing, strength, balance, and mild inattention and safety awareness on the L. These impairments are limiting his ability to safely and independently transfer, ambulate, and navigate stairs. Patient requires min A to mod Ax2 with all functional mobility. SPT recommending CIR upon D/C due to mobility deficits. Pt previously independent with mobility tasks and motivated to regain independence. PT will continue to follow acutely.          Recommendations for follow up therapy are one component of a multi-disciplinary discharge planning process, led by the attending physician.  Recommendations may be updated based on patient status, additional functional criteria and insurance authorization.  Follow Up Recommendations Acute inpatient rehab (3hours/day)    Assistance Recommended at Discharge Frequent or constant Supervision/Assistance  Patient can return home with the following  A lot of help with walking and/or transfers;A little help with bathing/dressing/bathroom;Assistance with cooking/housework;Direct supervision/assist for medications management;Direct supervision/assist for financial management;Assist for  transportation;Help with stairs or ramp for entrance    Equipment Recommendations None recommended by PT  Recommendations for Other Services       Functional Status Assessment Patient has had a recent decline in their functional status and demonstrates the ability to make significant improvements in function in a reasonable and predictable amount of time.     Precautions / Restrictions Precautions Precautions: Fall Restrictions Weight Bearing Restrictions: No      Mobility  Bed Mobility Overal bed mobility: Needs Assistance Bed Mobility: Supine to Sit     Supine to sit: Min assist Sit to supine: Min assist   General bed mobility comments: pt required min A for trunk elevation with increased time for problem solving and performing task. Required min A to scoot L hip to EOB.    Transfers Overall transfer level: Needs assistance Equipment used: 2 person hand held assist Transfers: Sit to/from Stand Sit to Stand: Mod assist, +2 physical assistance, +2 safety/equipment           General transfer comment: pt required mod A for lifting and steadying. Required VC's for hand placement. He initially wanted to stand up without HHA due to mild L inattention.    Ambulation/Gait Ambulation/Gait assistance: Mod assist, +2 physical assistance, +2 safety/equipment Gait Distance (Feet): 4 Feet Assistive device: 2 person hand held assist Gait Pattern/deviations: Step-to pattern, Narrow base of support, Trunk flexed, Drifts right/left, Shuffle, Decreased weight shift to right, Knees buckling, Decreased step length - right, Decreased step length - left, Decreased stride length Gait velocity: decreased Gait velocity interpretation: <1.31 ft/sec, indicative of household ambulator   General Gait Details: Pt had very narrow BOS and shuffled feet on ground with gait. Pt drifitng and leaning to the L during ambulation; pt unaware and had trouble weightshifting and sequencing bilaterally to  take steps.  Pt's L knee buckling throughout forward and backward steps in room.  Stairs            Wheelchair Mobility    Modified Rankin (Stroke Patients Only) Modified Rankin (Stroke Patients Only) Pre-Morbid Rankin Score: Moderate disability Modified Rankin: Moderately severe disability     Balance Overall balance assessment: Needs assistance Sitting-balance support: Feet supported, No upper extremity supported Sitting balance-Leahy Scale: Poor Sitting balance - Comments: pt required min guard to min A for trunk support as sat EOB Postural control: Posterior lean Standing balance support: Bilateral upper extremity supported, During functional activity Standing balance-Leahy Scale: Poor Standing balance comment: required mod Ax2 for standing balance with increased help for steadying when L knee buckled                             Pertinent Vitals/Pain Pain Assessment Pain Assessment: No/denies pain    Home Living Family/patient expects to be discharged to:: Private residence Living Arrangements: Spouse/significant other Available Help at Discharge: Family;Available 24 hours/day Type of Home: House Home Access: Stairs to enter Entrance Stairs-Rails: Right Entrance Stairs-Number of Steps: 3   Home Layout: One level Home Equipment: Cane - single point;Grab bars - toilet;Grab bars - tub/shower;Rolling Walker (2 wheels);Shower seat      Prior Function Prior Level of Function : Needs assist             Mobility Comments: uses cane in community, furniture walker at home; has been in outpt OT for 2 weeks since COVID ADLs Comments: requires assist for socks and depends, otherwise independent     Hand Dominance   Dominant Hand: Left    Extremity/Trunk Assessment   Upper Extremity Assessment Upper Extremity Assessment: LUE deficits/detail LUE Deficits / Details: pt with L sided sensation deficits since prior CVA (digits 2-3); grossly weaker 3+/5  MMT LUE Sensation: decreased light touch LUE Coordination: decreased fine motor    Lower Extremity Assessment Lower Extremity Assessment: Defer to PT evaluation LLE Deficits / Details: L knee buckling noted with gait; sensation intact; ROM intact, coordination normal with heel to shin testing, 4/5 with LLE MMTs. LLE Sensation: WNL LLE Coordination: decreased gross motor    Cervical / Trunk Assessment Cervical / Trunk Assessment: Kyphotic (forward head posture)  Communication   Communication: Other (comment);Expressive difficulties (slurred speech)  Cognition Arousal/Alertness: Awake/alert Behavior During Therapy: WFL for tasks assessed/performed Overall Cognitive Status: Impaired/Different from baseline Area of Impairment: Attention, Safety/judgement, Awareness                   Current Attention Level: Sustained     Safety/Judgement: Decreased awareness of safety, Decreased awareness of deficits Awareness: Emergent   General Comments: pt had some inattention to L side and did not want to use handheld assist with walking. Pt had difficulty sequencing steps during ambulation.        General Comments General comments (skin integrity, edema, etc.): spouse and daugther present, pt on 2L [place] (desaturated to 87% on RA, replaced O2 to 2L)    Exercises     Assessment/Plan    PT Assessment Patient needs continued PT services  PT Problem List Decreased strength;Decreased range of motion;Decreased activity tolerance;Decreased balance;Decreased mobility;Decreased cognition;Decreased coordination;Decreased knowledge of use of DME;Decreased safety awareness;Impaired sensation       PT Treatment Interventions DME instruction;Gait training;Stair training;Functional mobility training;Therapeutic activities;Therapeutic exercise;Neuromuscular re-education;Balance training;Patient/family education;Modalities    PT Goals (Current goals can be found in the  Care Plan section)  Acute  Rehab PT Goals Patient Stated Goal: to continue rehab and then go home PT Goal Formulation: With patient/family Time For Goal Achievement: 03/15/22 Potential to Achieve Goals: Good    Frequency Min 4X/week     Co-evaluation PT/OT/SLP Co-Evaluation/Treatment: Yes Reason for Co-Treatment: For patient/therapist safety;To address functional/ADL transfers PT goals addressed during session: Balance;Mobility/safety with mobility;Proper use of DME;Strengthening/ROM OT goals addressed during session: ADL's and self-care       AM-PAC PT "6 Clicks" Mobility  Outcome Measure Help needed turning from your back to your side while in a flat bed without using bedrails?: None Help needed moving from lying on your back to sitting on the side of a flat bed without using bedrails?: A Little Help needed moving to and from a bed to a chair (including a wheelchair)?: A Lot Help needed standing up from a chair using your arms (e.g., wheelchair or bedside chair)?: A Lot Help needed to walk in hospital room?: A Lot Help needed climbing 3-5 steps with a railing? : Total 6 Click Score: 14    End of Session Equipment Utilized During Treatment: Gait belt Activity Tolerance: Patient tolerated treatment well Patient left: in bed;with call bell/phone within reach;with family/visitor present (on hospital bed in ED) Nurse Communication: Mobility status PT Visit Diagnosis: Unsteadiness on feet (R26.81);Other abnormalities of gait and mobility (R26.89);Muscle weakness (generalized) (M62.81);Difficulty in walking, not elsewhere classified (R26.2)    Time: 3009-2330 PT Time Calculation (min) (ACUTE ONLY): 31 min   Charges:   PT Evaluation $PT Eval Moderate Complexity: 1 [address] SPT  [place] 03/01/2022, 2:29 PM

## 2022-03-02 ENCOUNTER — Telehealth: Payer: Self-pay

## 2022-03-02 LAB — BASIC METABOLIC PANEL
Anion gap: 6 (ref 5–15)
BUN: 15 mg/dL (ref 8–23)
CO2: 29 mmol/L (ref 22–32)
Calcium: 8.3 mg/dL — ABNORMAL LOW (ref 8.9–10.3)
Chloride: 102 mmol/L (ref 98–111)
Creatinine, Ser: 1.1 mg/dL (ref 0.61–1.24)
GFR, Estimated: >60 mL/min (ref >60)
Glucose, Bld: 107 mg/dL — ABNORMAL HIGH (ref 70–99)
Potassium: 3.4 mmol/L — ABNORMAL LOW (ref 3.5–5.1)
Sodium: 137 mmol/L (ref 135–145)

## 2022-03-02 LAB — CBC WITH DIFFERENTIAL/PLATELET
Abs Immature Granulocytes: 0.04 10*3/uL (ref 0.00–0.07)
Basophils Absolute: 0 10*3/uL (ref 0.0–0.1)
Basophils Relative: 1 %
Eosinophils Absolute: 0.2 10*3/uL (ref 0.0–0.5)
Eosinophils Relative: 4 %
HCT: 40.1 % (ref 39.0–52.0)
Hemoglobin: 13.8 g/dL (ref 13.0–17.0)
Immature Granulocytes: 1 %
Lymphocytes Relative: 19 %
Lymphs Abs: 1.3 10*3/uL (ref 0.7–4.0)
MCH: 34.4 pg — ABNORMAL HIGH (ref 26.0–34.0)
MCHC: 34.4 g/dL (ref 30.0–36.0)
MCV: 100 fL (ref 80.0–100.0)
Monocytes Absolute: 0.8 10*3/uL (ref 0.1–1.0)
Monocytes Relative: 12 %
Neutro Abs: 4.3 10*3/uL (ref 1.7–7.7)
Neutrophils Relative %: 63 %
Platelets: 212 10*3/uL (ref 150–400)
RBC: 4.01 MIL/uL — ABNORMAL LOW (ref 4.22–5.81)
RDW: 13.2 % (ref 11.5–15.5)
WBC: 6.7 10*3/uL (ref 4.0–10.5)
nRBC: 0 % (ref 0.0–0.2)

## 2022-03-02 LAB — GLUCOSE, CAPILLARY
Glucose-Capillary: 113 mg/dL — ABNORMAL HIGH (ref 70–99)
Glucose-Capillary: 99 mg/dL (ref 70–99)
Glucose-Capillary: 123 mg/dL — ABNORMAL HIGH (ref 70–99)
Glucose-Capillary: 104 mg/dL — ABNORMAL HIGH (ref 70–99)

## 2022-03-02 LAB — PROCALCITONIN: Procalcitonin: <0.1 ng/mL

## 2022-03-02 MED ORDER — ASPIRIN 81 MG PO CHEW
81.0000 mg | CHEWABLE_TABLET | Freq: Every day | ORAL | Status: DC
  Administered 2022-03-02 – 2022-03-04 (×3): 81 mg via ORAL
  Filled 2022-03-02 (×3): qty 1

## 2022-03-02 MED ORDER — VITAMIN B-12 1000 MCG PO TABS
1000.0000 ug | ORAL_TABLET | Freq: Every day | ORAL | Status: DC
  Administered 2022-03-03 – 2022-03-04 (×2): 1000 ug via ORAL
  Filled 2022-03-02 (×3): qty 1

## 2022-03-02 MED ORDER — CLOPIDOGREL BISULFATE 75 MG PO TABS
75.0000 mg | ORAL_TABLET | Freq: Every day | ORAL | Status: DC
  Administered 2022-03-02 – 2022-03-04 (×3): 75 mg via ORAL
  Filled 2022-03-02 (×3): qty 1

## 2022-03-02 MED ORDER — LOSARTAN POTASSIUM 50 MG PO TABS
50.0000 mg | ORAL_TABLET | Freq: Every day | ORAL | Status: DC
  Administered 2022-03-02 – 2022-03-04 (×3): 50 mg via ORAL
  Filled 2022-03-02 (×3): qty 1

## 2022-03-02 MED ORDER — CEFDINIR 300 MG PO CAPS
300.0000 mg | ORAL_CAPSULE | Freq: Two times a day (BID) | ORAL | Status: DC
  Administered 2022-03-02 – 2022-03-04 (×4): 300 mg via ORAL
  Filled 2022-03-02 (×5): qty 1

## 2022-03-02 MED ORDER — GUAIFENESIN ER 600 MG PO TB12
600.0000 mg | ORAL_TABLET | Freq: Two times a day (BID) | ORAL | Status: DC
  Administered 2022-03-02 – 2022-03-04 (×5): 600 mg via ORAL
  Filled 2022-03-02 (×5): qty 1

## 2022-03-02 NOTE — Plan of Care (Signed)
?  Problem: Education: ?Goal: Knowledge of General Education information will improve ?Description: Including pain rating scale, medication(s)/side effects and non-pharmacologic comfort measures ?Outcome: Progressing ?  ?Problem: Health Behavior/Discharge Planning: ?Goal: Ability to manage health-related needs will improve ?Outcome: Progressing ?  ?Problem: Clinical Measurements: ?Goal: Ability to maintain clinical measurements within normal limits will improve ?Outcome: Progressing ?Goal: Will remain free from infection ?Outcome: Progressing ?Goal: Diagnostic test results will improve ?Outcome: Progressing ?Goal: Respiratory complications will improve ?Outcome: Progressing ?Goal: Cardiovascular complication will be avoided ?Outcome: Progressing ?  ?Problem: Activity: ?Goal: Risk for activity intolerance will decrease ?Outcome: Progressing ?  ?Problem: Nutrition: ?Goal: Adequate nutrition will be maintained ?Outcome: Progressing ?  ?Problem: Coping: ?Goal: Level of anxiety will decrease ?Outcome: Progressing ?  ?Problem: Elimination: ?Goal: Will not experience complications related to bowel motility ?Outcome: Progressing ?Goal: Will not experience complications related to urinary retention ?Outcome: Progressing ?  ?Problem: Pain Managment: ?Goal: General experience of comfort will improve ?Outcome: Progressing ?  ?Problem: Safety: ?Goal: Ability to remain free from injury will improve ?Outcome: Progressing ?  ?Problem: Skin Integrity: ?Goal: Risk for impaired skin integrity will decrease ?Outcome: Progressing ?  ?Problem: Education: ?Goal: Knowledge of disease or condition will improve ?Outcome: Progressing ?Goal: Knowledge of secondary prevention will improve (SELECT ALL) ?Outcome: Progressing ?Goal: Knowledge of patient specific risk factors will improve (INDIVIDUALIZE FOR PATIENT) ?Outcome: Progressing ?Goal: Individualized Educational Video(s) ?Outcome: Progressing ?  ?Problem: Coping: ?Goal: Will verbalize  positive feelings about self ?Outcome: Progressing ?  ?Problem: Self-Care: ?Goal: Ability to participate in self-care as condition permits will improve ?Outcome: Progressing ?Goal: Verbalization of feelings and concerns over difficulty with self-care will improve ?Outcome: Progressing ?  ?

## 2022-03-02 NOTE — Telephone Encounter (Signed)
Patient Name: [name] [name] ?MRN: [id number] ?DOB:[birth year], 81 y.o., male ?Today's Date: 03/02/2022 ? ?Called patient's wife today. Patient had another stroke and is currently in the hospital. Due to significant medical status change, patient will be discharged from skilled physical therapy. Wife verbalized understanding with discharge planning and they will need new orders before continuing physical therapy. ? ? ?[name], PT ?03/02/2022, 9:51 AM ? ?

## 2022-03-02 NOTE — Progress Notes (Signed)
Speech Language Pathology Treatment: Cognitive-Linquistic (Dysarthria)  ?Patient Details ?Name: [name] [name] ?MRN: [id number] ?DOB: 1941/10/10 ?Today's Date: 03/02/2022 ?Time: 1245-8099 ?SLP Time Calculation (min) (ACUTE ONLY): 15 min ? ?Assessment / Plan / Recommendation ?Clinical Impression ? Pt was seen for dysarthria treatment with his wife present and he was cooperative during the session. Pt and his wife were educated regarding the nature of dysarthria, compensatory strategies to improve speech intelligibility, and pt's prognosis for improvement. Dysarthria literature was provided and both parties verbalized understanding regarding all areas of education. He used compensatory strategies at the phrase level with 80% accuracy increasing to 100% accuracy with cues for overarticulation of articulatory-complex or multisyllabic words. At the paragraph level he demonstrated 60% accuracy increasing to 80% accuracy with cues for rate, maintenance of vocal intensity, and for overarticulation. Some generalization to conversational speech was noted at the end of the session. SLP will continue to follow pt.  ?  ?HPI HPI: [name] [name] is a 81 y.o. male with medical history significant of hx of thalamic stroke in 03/2021, HTN, HLD, T2DM, CAD s/p CABG 2005, carotid artery disease who presented to ED with left sided weakness/leg weakness and slurred speech.  Pt coughing and choking with eating that has been worse over the past 2-3 days. MRI Acute infarct right corona radiata. ?  ?   ?SLP Plan ? Continue with current plan of care ? ?Patient needs continued Speech Lanaguage Pathology Services ?  ?Recommendations for follow up therapy are one component of a multi-disciplinary discharge planning process, led by the attending physician.  Recommendations may be updated based on patient status, additional functional criteria and insurance authorization. ?  ? ?Recommendations  ?Diet recommendations: Regular;Thin liquid ?Liquids  provided via: Cup ?Medication Administration: Whole meds with puree ?Supervision: Patient able to self feed;Intermittent supervision to cue for compensatory strategies ?Compensations: Minimize environmental distractions;Slow rate;Small sips/bites;Chin tuck ?Postural Changes and/or Swallow Maneuvers: Seated upright 90 degrees  ?   ?    ?   ? ? ? ? Oral Care Recommendations: Oral care BID ?Follow Up Recommendations: Acute inpatient rehab (3hours/day) (TBD) ?Assistance recommended at discharge: Intermittent Supervision/Assistance ?SLP Visit Diagnosis: Dysarthria and anarthria (R47.1) ?Plan: Continue with current plan of care ? ? ? ? ?  ?  ? ?Avarose Mervine I. Hardin Negus, [place], CCC-SLP ?Acute Rehabilitation Services ?Office number [phone] ?Pager [phone] ? ?[name] ? ?03/02/2022, 11:27 AM ? ? ? ?

## 2022-03-02 NOTE — Plan of Care (Signed)
  Problem: Coping: Goal: Level of anxiety will decrease Outcome: Progressing   Problem: Elimination: Goal: Will not experience complications related to bowel motility Outcome: Progressing Goal: Will not experience complications related to urinary retention Outcome: Progressing   Problem: Safety: Goal: Ability to remain free from injury will improve Outcome: Progressing   

## 2022-03-02 NOTE — Evaluation (Signed)
Speech Language Pathology Evaluation ?Patient Details ?Name: [name] [name] ?MRN: [id number] ?DOB: [birth year] ?Today's Date: 03/02/2022 ?Time: 3716-9678 ?SLP Time Calculation (min) (ACUTE ONLY): 19 min ? ?Problem List:  ?Patient Active Problem List  ? Diagnosis Date Noted  ? Stroke-like symptoms 03/01/2022  ? Acute cerebrovascular accident (CVA) ([place]) 02/28/2022  ? Aspiration pneumonia vs. CAP 02/28/2022  ? Acute cough 02/26/2022  ? Upper respiratory tract infection 02/26/2022  ? Pain due to onychomycosis of toenails of both feet 02/13/2022  ? Blood clotting disorder ([place]) 02/13/2022  ? History of COVID-19 01/08/2022  ? 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 TIA (transient ischemic attack) 04/03/2021  ? Lower extremity edema 08/13/2019  ? Unsteady gait 06/09/2019  ? Trigger ring finger of right hand 06/02/2018  ? Hyperlipidemia 01/30/2016  ? Carotid stenosis 08/26/2015  ? 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 Carotid artery disease ([place]) 07/01/2012  ? Special screening for malignant neoplasm of prostate 05/08/2011  ? Vitamin D deficiency 11/20/2009  ? Transient cerebral ischemia 06/30/2009  ? CEREBROVASCULAR DISEASE 06/30/2009  ? COLONIC POLYPS 01/10/2008  ? Essential hypertension 01/10/2008  ? Coronary atherosclerosis 01/10/2008  ? 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 GYNECOMASTIA, UNILATERAL 01/10/2008  ? NEURODERMATITIS 01/10/2008  ? 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 SHOULDER PAIN 01/10/2008  ? Type 2 diabetes mellitus ([place]) 01/10/2008  ? ?Past Medical History:  ?Past Medical History:  ?Diagnosis Date  ? Basal cell carcinoma 11/09/2019  ? nod & infil-behind right ear-cx3 &exc  ? Basal cell carcinoma 03/21/2020  ? Residual BCC with peripheral margin involved - ST recommends MOHs  ? Carotid artery occlusion   ? Coronary artery disease   ? s/p CABG 2005; sees Dr Johnsie Cancel yearly  ? Diabetes mellitus without complication ([place])   ? Gynecomastia   ? Hypercholesterolemia   ? Hypertension   ? Neurodermatitis   ?  Overweight(278.02)   ? Personal history of colonic polyps 10/08/2006  ? tubular adenomas  ? Renal insufficiency   ? Stroke [facility])   ? TIA  April 2022  ? Transient ischemic attack 2010  ? "lasted ~ 5 seconds"  ? Vitamin D deficiency   ? ?Past Surgical History:  ?Past Surgical History:  ?Procedure Laterality Date  ? CARDIAC CATHETERIZATION  02/2004  ? "tried to stent; couldn't"  ? CAROTID ENDARTERECTOMY Right 08/26/2015  ? COLONOSCOPY    ? CORONARY ANGIOPLASTY    ? CORONARY ARTERY BYPASS GRAFT  Feb. 2005  ? 4 vessel  ? ENDARTERECTOMY Right 08/26/2015  ? Procedure: Right Carotid ENDARTERECTOMY with Patch Angioplasty ;  Surgeon: Rosetta Posner, MD;  Location: [place];  Service: Vascular;  Laterality: Right;  ? ENDARTERECTOMY Left 05/18/2021  ? Procedure: LEFT CAROTID ARTERY ENDARTERECTOMY  with patch angioplasty;  Surgeon: Rosetta Posner, MD;  Location: [place];  Service: Vascular;  Laterality: Left;  ? EYE SURGERY    ? bilateral cataract  ? KELOID EXCISION  04/2008  ? on chest scar; Dr. Dessie Coma  ? KELOID EXCISION    ? PILONIDAL CYST EXCISION  1989  ? ?HPI:  ?[name] [name] is a 81 y.o. male with medical history significant of hx of thalamic stroke in 03/2021, HTN, HLD, T2DM, CAD s/p CABG 2005, carotid artery disease who presented to ED with left sided weakness/leg weakness and slurred speech.  Pt coughing and choking with eating that has been worse over the past 2-3 days. MRI Acute infarct right corona radiata.  ? ?Assessment / Plan / Recommendation ?Clinical Impression ? Pt participated in  speech-language-cognition evaluation with his wife present. Pt reported that he is retired and lives with his wife. He stated that he typically manages the bills and finances, but has recently been requesting assistance from his wife due to increased difficulty with writing legibly. Pt reported that he has some difficulty with memory at baseline for which he compensates by writing information down. Both parties denied any acute changes in  cognition or language. Pt and his wife agreed that the pt's speech is not as clear as his baseline and pt's wife stated that it is currently softer. The [facility] Mental Status Examination was completed to evaluate the pt's cognitive-linguistic skills. He achieved a score of 25/30 which is slightly below the normal limits of 27 or more out of 30 and is suggestive of a mild impairment. He exhibited difficulty with selective attention and memory, but both parties indicated that his performance was not atypical. He presented with mild dysarthria characterized by reduced articulatory precision and a reduced vocal intensity which intermittently impacted speech intelligibility during conversation. Skilled SLP services are clinically indicated at this time to target dysarthria. ?   ?SLP Assessment ? SLP Recommendation/Assessment: Patient needs continued [place] Pathology Services ?SLP Visit Diagnosis: Dysarthria and anarthria (R47.1)  ?  ?Recommendations for follow up therapy are one component of a multi-disciplinary discharge planning process, led by the attending physician.  Recommendations may be updated based on patient status, additional functional criteria and insurance authorization. ?   ?Follow Up Recommendations ? Acute inpatient rehab (3hours/day) (TBD)  ?  ?Assistance Recommended at Discharge ? Intermittent Supervision/Assistance  ?Functional Status Assessment Patient has had a recent decline in their functional status and demonstrates the ability to make significant improvements in function in a reasonable and predictable amount of time.  ?Frequency and Duration min 2x/week  ?2 weeks ?  ?   ?SLP Evaluation ?Cognition ? Overall Cognitive Status: History of cognitive impairments - at baseline ?Arousal/Alertness: Awake/alert ?Orientation Level: Oriented X4 ?Year: 2023 ?Month: March ?Day of Week: Correct ?Attention: Focused;Sustained;Selective ?Focused Attention: Appears intact ?Sustained  Attention: Appears intact ?Selective Attention: Impaired ?Selective Attention Impairment: Verbal complex ?Memory: Impaired ?Memory Impairment: Storage deficit;Decreased recall of new information (Immediate: 5/5; delayed: 4/5; with cue: 1/1) ?Problem Solving: Impaired ?Problem Solving Impairment: Verbal complex (Money: 1/3) ?Executive Function: Reasoning;Sequencing ?Reasoning: Appears intact ?Sequencing: Appears intact  ?  ?   ?Comprehension ? Auditory Comprehension ?Overall Auditory Comprehension: Appears within functional limits for tasks assessed ?Yes/No Questions: Within Functional Limits ?Commands: Within Functional Limits ?Conversation: Complex ?Reading Comprehension ?Reading Status: Within funtional limits  ?  ?Expression Expression ?Primary Mode of Expression: Verbal ?Verbal Expression ?Overall Verbal Expression: Appears within functional limits for tasks assessed ?Initiation: No impairment ?Repetition: No impairment ?Naming: No impairment   ?Oral / Motor ? Oral Motor/Sensory Function ?Overall Oral Motor/Sensory Function: Mild impairment ?Facial ROM: Reduced left;Suspected CN VII (facial) dysfunction ?Facial Symmetry: Abnormal symmetry left;Suspected CN VII (facial) dysfunction ?Facial Strength: Reduced left;Suspected CN VII (facial) dysfunction ?Lingual ROM: Within Functional Limits ?Lingual Symmetry: Within Functional Limits ?Lingual Strength: Reduced ?Motor Speech ?Overall Motor Speech: Impaired ?Respiration: Impaired ?Level of Impairment: Conversation ?Phonation: Low vocal intensity ?Resonance: Within functional limits ?Articulation: Impaired ?Level of Impairment: Conversation ?Intelligibility: Intelligibility reduced ?Word: 75-100% accurate ?Phrase: 75-100% accurate ?Sentence: 75-100% accurate ?Conversation: 75-100% accurate ?Motor Planning: Witnin functional limits ?Motor Speech Errors: Aware;Consistent   ?        ?Cay Kath I. Hardin Negus, [place], CCC-SLP ?Acute Rehabilitation Services ?Office number  [phone] ?Pager [phone] ? ?Tobie Poet  Ronne Binning ?03/02/2022, 11:20 AM ? ? ?

## 2022-03-02 NOTE — Progress Notes (Signed)
Physical Therapy Treatment ?Patient Details ?Name: [name] [name] ?MRN: [id number] ?DOB: [birth year] ?Today's Date: 03/02/2022 ? ? ?History of Present Illness Pt is a 81 yo male presenting to ED 02/28/22 with slurred speech, L sided weakness, and L facial droop that has progressively worsened over the past several days. Imaging show R corona radiata CVA. CXR with worsening consolidation in LLL and concern for aspiration PNA. 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 ? ?  ?PT Comments  ? ? Focus of session today functional bed mobility, transfers, gait, and balance challenges. The patient tolerated well.  Pt. Shows overall improvement with his overall gait and transfer capacity. His higher level and reactive balance, as well as insight to deficits are still limiting function. Pt. Would benefit from skilled PT to continue to address further gait, transfer ability, strength deficits, and higher level balance challenges. At this time SPT changing recommendation to home health follow due to patient's great deal of functional improvements. Plan to also prioritize treatments session over the weekend to further assess progress as appropriate.  ?   ?Recommendations for follow up therapy are one component of a multi-disciplinary discharge planning process, led by the attending physician.  Recommendations may be updated based on patient status, additional functional criteria and insurance authorization. ? ?Follow Up Recommendations ? Home health PT ?  ?  ?Assistance Recommended at Discharge Frequent or constant Supervision/Assistance  ?Patient can return home with the following A little help with bathing/dressing/bathroom;Assistance with cooking/housework;Direct supervision/assist for medications management;Direct supervision/assist for financial management;Assist for transportation;Help with stairs or ramp for entrance;A little  help with walking and/or transfers ?  ?Equipment Recommendations ? None recommended by PT  ?  ?Recommendations for Other Services   ? ? ?  ?Precautions / Restrictions Precautions ?Precautions: Fall ?Restrictions ?Weight Bearing Restrictions: No  ?  ? ?Mobility ? Bed Mobility ?Overal bed mobility: Needs Assistance ?Bed Mobility: Supine to Sit, Sit to Supine ?  ?  ?Supine to sit: Min guard ?Sit to supine: Min guard ?  ?General bed mobility comments: Min guard for sequcening cues, increased time, and to obtain balance once sitting. ?  ? ?Transfers ?Overall transfer level: Needs assistance ?Equipment used: Rolling walker (2 wheels) ?Transfers: Sit to/from Stand ?Sit to Stand: Min assist ?  ?  ?  ?  ?  ?General transfer comment: Requires increased time, min A for power up and up to Mod A for steading. Presenting with posterior lean, cued to pull toes up when he feels himeself leaning. ?  ? ?Ambulation/Gait ?Ambulation/Gait assistance: Min assist ?Gait Distance (Feet): 150 Feet ?Assistive device: Rolling walker (2 wheels) ?  ?Gait velocity: decreased ?  ?  ?General Gait Details: Pt. cued to fully clear LE during gait, easily distractable throughout, walks with narrow stance even when cued to widen feet ? ? ?Stairs ?  ?  ?  ?  ?  ? ? ?Wheelchair Mobility ?  ? ?Modified Rankin (Stroke Patients Only) ?Modified Rankin (Stroke Patients Only) ?Pre-Morbid Rankin Score: Moderate disability ?Modified Rankin: Moderately severe disability ? ? ?  ?Balance Overall balance assessment: Needs assistance ?Sitting-balance support: Feet supported, No upper extremity supported ?Sitting balance-Leahy Scale: Fair ?Sitting balance - Comments: Able to lift feet off the ground and minimally reliant on use of hands ?Postural control: Posterior lean ?Standing balance support: During functional activity, Bilateral upper extremity supported ?Standing balance-Leahy Scale: Poor ?Standing balance comment: relies  on BUE and external support. Significant  posterior lean, causing him to sit back down. Shows improvement with more time standing ?  ?  ?  ?  ?  ?  ?  ?  ?  ?  ?  ?  ? ?  ?Cognition Arousal/Alertness: Awake/alert ?Behavior During Therapy: [facility] for tasks assessed/performed ?Overall Cognitive Status: History of cognitive impairments - at baseline ?Area of Impairment: Attention, Safety/judgement, Awareness, Following commands ?  ?  ?  ?  ?  ?  ?  ?  ?  ?Current Attention Level: Sustained ?  ?Following Commands: Follows one step commands consistently, Follows multi-step commands consistently ?  ?  ?Problem Solving: Requires verbal cues ?General Comments: Pt. oriented and excited to progress mobility, requires min cuing for balance tasks. Not fully aware of deficits. ?  ?  ? ?  ?Exercises   ? ?  ?General Comments General comments (skin integrity, edema, etc.): Spouse present, VSS on RA ?  ?  ? ?Pertinent Vitals/Pain Pain Assessment ?Pain Assessment: No/denies pain  ? ? ?Home Living   ?  ?  ?  ?  ?  ?  ?  ?  ?  ?   ?  ?Prior Function    ?  ?  ?   ? ?PT Goals (current goals can now be found in the care plan section) Acute Rehab PT Goals ?Patient Stated Goal: Transition to home and then to outpatient PT when ready ?PT Goal Formulation: With patient ?Time For Goal Achievement: 03/15/22 ?Potential to Achieve Goals: Good ?Progress towards PT goals: Progressing toward goals ? ?  ?Frequency ? ? ? Min 4X/week ? ? ? ?  ?PT Plan Current plan remains appropriate  ? ? ?Co-evaluation   ?  ?  ?  ?  ? ?  ?AM-PAC PT "6 Clicks" Mobility   ?Outcome Measure ? Help needed turning from your back to your side while in a flat bed without using bedrails?: None ?Help needed moving from lying on your back to sitting on the side of a flat bed without using bedrails?: A Little ?Help needed moving to and from a bed to a chair (including a wheelchair)?: A Little ?Help needed standing up from a chair using your arms (e.g., wheelchair or bedside chair)?: A Little ?Help needed to walk in hospital  room?: A Little ?Help needed climbing 3-5 steps with a railing? : A Lot ?6 Click Score: 18 ? ?  ?End of Session Equipment Utilized During Treatment: Gait belt ?Activity Tolerance: Patient tolerated treatment well ?Patient left: in chair;with call bell/phone within reach;with chair alarm set;with family/visitor present ?Nurse Communication: Mobility status ?PT Visit Diagnosis: Unsteadiness on feet (R26.81);Other abnormalities of gait and mobility (R26.89);Muscle weakness (generalized) (M62.81);Difficulty in walking, not elsewhere classified (R26.2) ?  ? ? ?Time: 6283-6629 ?PT Time Calculation (min) (ACUTE ONLY): 27 min ? ?Charges:  $Gait Training: 8-22 mins ?$Neuromuscular Re-education: 8-22 mins          ?          ? ?Thermon Leyland, SPT ?Acute Rehab Services ? ? ? ?Thermon Leyland ?03/02/2022, 2:54 PM ? ?

## 2022-03-02 NOTE — TOC Initial Note (Signed)
Transition of Care (TOC) - Initial/Assessment Note  ? ? ?Patient Details  ?Name: [name] [name] ?MRN: [id number] ?Date of Birth: 06/08/1941 ? ?Transition of Care (TOC) CM/SW Contact:    ?Pollie Friar, RN ?Phone Number: ?03/02/2022, 1:48 PM ? ?Clinical Narrative:                 ?Patient is from home with his spouse that can provide 24 hour supervision. Wife is able to provide transportation and assist with his medications at home.  ?Recommendations have changed to [facility] from CIR. Wife and spouse are agreeable and have no preference for [facility] agency. CM has gotten [place] arrange with [facility]. Information on the AVS. ?Pt has all needed DME at home.  ?TOC following. ? ?Expected Discharge Plan: [place] ?Barriers to Discharge: Continued Medical Work up ? ? ?Patient Goals and CMS Choice ?  ?CMS Medicare.gov Compare Post Acute Care list provided to:: Patient ?Choice offered to / list presented to : Patient, Spouse ? ?Expected Discharge Plan and Services ?Expected Discharge Plan: [place] ?  ?Discharge Planning Services: CM Consult ?Post Acute Care Choice: Home Health ?Living arrangements for the past 2 months: [place] ?                ?  ?  ?  ?  ?  ?HH Arranged: PT, OT, Speech Therapy ?[place] Agency: [place] ?Date HH Agency Contacted: 03/02/22 ?  ?Representative spoke with at [place]: Tommi Rumps ? ?Prior Living Arrangements/Services ?Living arrangements for the past 2 months: [place] ?Lives with:: Spouse ?Patient language and need for interpreter reviewed:: Yes ?Do you feel safe going back to the place where you live?: Yes      ?Need for Family Participation in Patient Care: Yes (Comment) ?Care giver support system in place?: Yes (comment) ?Current home services: DME (shower seat/ elevated toilet/ hand rail in bathroom/ walker/ cane) ?Criminal Activity/Legal Involvement Pertinent to Current Situation/Hospitalization: No - Comment as  needed ? ?Activities of Daily Living ?Home Assistive Devices/Equipment: Cane (specify quad or straight) ?ADL Screening (condition at time of admission) ?Patient's cognitive ability adequate to safely complete daily activities?: Yes ?Is the patient deaf or have difficulty hearing?: Yes ?Does the patient have difficulty seeing, even when wearing glasses/contacts?: Yes ?Does the patient have difficulty concentrating, remembering, or making decisions?: No ?Patient able to express need for assistance with ADLs?: Yes ?Does the patient have difficulty dressing or bathing?: Yes ?Independently performs ADLs?: No ?Communication: Independent ?Dressing (OT): Needs assistance ?Is this a change from baseline?: Change from baseline, expected to last <3days ?Grooming: Needs assistance ?Is this a change from baseline?: Change from baseline, expected to last <3 days ?Feeding: Independent ?Bathing: Needs assistance ?Is this a change from baseline?: Change from baseline, expected to last <3 days ?Toileting: Needs assistance ?Is this a change from baseline?: Change from baseline, expected to last <3 days ?In/Out Bed: Needs assistance ?Is this a change from baseline?: Change from baseline, expected to last <3 days ?Walks in Home: Independent ?Does the patient have difficulty walking or climbing stairs?: No ?Weakness of Legs: None ?Weakness of Arms/Hands: None ? ?Permission Sought/Granted ?  ?  ?   ?   ?   ?   ? ?Emotional Assessment ?Appearance:: Appears stated age ?Attitude/Demeanor/Rapport: Engaged ?Affect (typically observed): Accepting ?Orientation: : Oriented to Self, Oriented to Place, Oriented to  Time, Oriented to Situation ?  ?Psych Involvement: No (comment) ? ?Admission  diagnosis:  Shortness of breath [R06.02] ?Stroke-like symptoms [R29.90] ?Dysphagia [R13.10] ?Cerebral infarction, unspecified mechanism ([place]) [I63.9] ?Patient Active Problem List  ? Diagnosis Date Noted  ? Stroke-like symptoms 03/01/2022  ? Acute cerebrovascular  accident (CVA) ([place]) 02/28/2022  ? Aspiration pneumonia vs. CAP 02/28/2022  ? Acute cough 02/26/2022  ? Upper respiratory tract infection 02/26/2022  ? Pain due to onychomycosis of toenails of both feet 02/13/2022  ? Blood clotting disorder ([place]) 02/13/2022  ? History of COVID-19 01/08/2022  ? 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 TIA (transient ischemic attack) 04/03/2021  ? Lower extremity edema 08/13/2019  ? Unsteady gait 06/09/2019  ? Trigger ring finger of right hand 06/02/2018  ? Hyperlipidemia 01/30/2016  ? Carotid stenosis 08/26/2015  ? 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 Carotid artery disease ([place]) 07/01/2012  ? Special screening for malignant neoplasm of prostate 05/08/2011  ? Vitamin D deficiency 11/20/2009  ? Transient cerebral ischemia 06/30/2009  ? CEREBROVASCULAR DISEASE 06/30/2009  ? COLONIC POLYPS 01/10/2008  ? Essential hypertension 01/10/2008  ? Coronary atherosclerosis 01/10/2008  ? 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 GYNECOMASTIA, UNILATERAL 01/10/2008  ? NEURODERMATITIS 01/10/2008  ? 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 SHOULDER PAIN 01/10/2008  ? Type 2 diabetes mellitus ([place]) 01/10/2008  ? ?PCP:  Pleas Koch, NP ?Pharmacy:   ?[id number] [place], [place] - [address] ?[address] ?[place] [address] ?Phone: [phone] Fax: [phone] ? ? ? ? ?Social Determinants of Health (SDOH) Interventions ?  ? ?Readmission Risk Interventions ?No flowsheet data found. ? ? ?

## 2022-03-02 NOTE — Plan of Care (Signed)
?  Problem: Coping: ?Goal: Level of anxiety will decrease ?Outcome: Progressing ?  ?Problem: Elimination: ?Goal: Will not experience complications related to bowel motility ?Outcome: Progressing ?Goal: Will not experience complications related to urinary retention ?Outcome: Progressing ?  ?Problem: Safety: ?Goal: Ability to remain free from injury will improve ?Outcome: Progressing ?  ?Problem: Education: ?Goal: Knowledge of disease or condition will improve ?Outcome: Progressing ?Goal: Knowledge of secondary prevention will improve (SELECT ALL) ?Outcome: Progressing ?  ?Problem: Coping: ?Goal: Will verbalize positive feelings about self ?Outcome: Progressing ?  ?

## 2022-03-02 NOTE — Telephone Encounter (Signed)
Patient Name: [name] [name] ?MRN: [id number] ?DOB:[birth year], 81 y.o., male ?Today's Date: 03/02/2022 ? ?Called patient and spoke with his wife. Patient is currently in the hospital due to having another stroke. Wife was educated due to significant medical status change, they will need new therapy orders to continue with outpatient PT. Wife verbalized understanding and patient will be discharged from outpatient physical therapy as of 03/02/22 ? ? ?[name], PT ?03/02/2022, 9:42 AM ? ?

## 2022-03-02 NOTE — Plan of Care (Signed)
?  Problem: Coping: ?Goal: Level of anxiety will decrease ?Outcome: Progressing ?  ?Problem: Elimination: ?Goal: Will not experience complications related to bowel motility ?Outcome: Progressing ?Goal: Will not experience complications related to urinary retention ?Outcome: Progressing ?  ?Problem: Safety: ?Goal: Ability to remain free from injury will improve ?Outcome: Progressing ?  ?Problem: Education: ?Goal: Knowledge of disease or condition will improve ?Outcome: Progressing ?Goal: Knowledge of secondary prevention will improve (SELECT ALL) ?Outcome: Progressing ?  ?Problem: Coping: ?Goal: Will verbalize positive feelings about self ?Outcome: Progressing ?  ?Problem: Coping: ?Goal: Level of anxiety will decrease ?Outcome: Progressing ?  ?Problem: Elimination: ?Goal: Will not experience complications related to bowel motility ?Outcome: Progressing ?Goal: Will not experience complications related to urinary retention ?Outcome: Progressing ?  ?Problem: Safety: ?Goal: Ability to remain free from injury will improve ?Outcome: Progressing ?  ?Problem: Education: ?Goal: Knowledge of disease or condition will improve ?Outcome: Progressing ?Goal: Knowledge of secondary prevention will improve (SELECT ALL) ?Outcome: Progressing ?  ?Problem: Coping: ?Goal: Will verbalize positive feelings about self ?Outcome: Progressing ?  ?Problem: Coping: ?Goal: Level of anxiety will decrease ?Outcome: Progressing ?  ?Problem: Elimination: ?Goal: Will not experience complications related to bowel motility ?Outcome: Progressing ?Goal: Will not experience complications related to urinary retention ?Outcome: Progressing ?  ?Problem: Safety: ?Goal: Ability to remain free from injury will improve ?Outcome: Progressing ?  ?Problem: Education: ?Goal: Knowledge of disease or condition will improve ?Outcome: Progressing ?Goal: Knowledge of secondary prevention will improve (SELECT ALL) ?Outcome: Progressing ?  ?Problem: Coping: ?Goal: Will  verbalize positive feelings about self ?Outcome: Progressing ?  ?

## 2022-03-02 NOTE — Progress Notes (Signed)
Inpatient Rehabilitation Admissions Coordinator  ? ?Discussed with P.T. He has progressed now to not need a CIR admit. We will sign off and not pursue CIR admit.TOC made aware. ? ?Danne Baxter, RN, MSN ?Rehab Admissions Coordinator ?[phone] ?03/02/2022 11:49 AM ? ?

## 2022-03-02 NOTE — Progress Notes (Addendum)
?PROGRESS NOTE ? ?[name] [name]  ?DOB: 10-19-1941  ?PCP: Pleas Koch, NP ?XNT:[id number]  ?DOA: 02/28/2022 ? LOS: 1 day  ?Hospital Day: 3 ? ?Brief narrative: ?[name] [name] is a 81 y.o. male with PMH significant for thalamic stroke in 03/2021, HTN, HLD, T2DM, CAD s/p CABG 2005, carotid artery disease s/p bl CEA. ?Patient was brought to the ED from home with new weakness in the left side, slurred speech for more than 24 hours. ?Patient saw his PCP on Monday 2/27 for low-grade fever, coughing and was treated with Z-Pak. ?  ?In the ED, patient was afebrile, blood pressure 135/53, heart rate 66, breathing on room air.   ?CT head did not show an acute finding but showed multiple remote lacunar infarcts on background of parenchymal volume loss and chronic white matter microangiopathy.  ? ?Code stroke was called.  Seen by neurology. ?Admitted to hospitalist service ? ?Subjective: ?Patient was seen and examined this morning. ?Physical therapy was working with the patient at the time.  Wife at bedside. ?Yesterday, PT recommendation was for SNF versus inpatient rehab. ?Based on patient's performance today, therapy thinks he can improve over the weekend to be eligible for home health PT. ? ?Principal Problem: ?  Acute cerebrovascular accident (CVA) ([place]) ?Active Problems: ?  Essential hypertension ?  Coronary atherosclerosis ?  Type 2 diabetes mellitus ([place]) ?  Carotid artery disease ([place]) ?  Hyperlipidemia ?  Aspiration pneumonia vs. CAP ?  Stroke-like symptoms ?  ? ?Assessment and Plan: ?Acute cerebrovascular accident  ?Right coronary radiata infarct ?Multiple remote lacunar infarcts ?Chronic white matter microangiopathy ?-Presented with acute left-sided weakness, facial droop and slurred speech.   ?-History of thalamic stroke in 03/2021.   ?-CT scan of head did not show any acute finding.   ?-MRI brain showed acute right coronary to infarct ?-CTA head and neck did not show any large vessel occlusion or  stenosis ?-LDL 55, A1c 6.1 ?-Neurology consult appreciated. ?-Echocardiogram showed EF of 55 to 60% with grade 1 diastolic dysfunction, no interatrial shunt.. ?-Prior to admission, patient was on Plavix 75 mg daily.  Neurology recommended DAPT with aspirin 81 mg daily and Plavix 75 mg daily for 3 months after which patient will transition back to Plavix 75 mg daily. ?-PT/OT eval obtained. ? ?Aspiration pneumonia vs. CAP ?-Hx of URI with low grade fever and productive cough.  ?-Recently seen at PCP and given azithromycin ?-CXR concerning for worsening consolidation in LLL.  ?-Concern for possible aspiration with history of coughing after swallowing  ?-Speech therapy evaluation obtained.  Regular consistency diet recommended.   ?-No fever, WBC count normal, procalcitonin level normal, abnormal chest x-ray. ?-blood cultures did not show any growth. ?-Currently on IV Rocephin.  Switch to oral Omnicef to complete 5 days of antibiotics. ?Recent Labs  ?Lab 02/28/22 ?1100 02/28/22 ?2102 03/01/22 ?0615 03/02/22 ?9675  ?WBC 6.5  --   --  6.7  ?PROCALCITON  --  <0.10 <0.10 <0.10  ? ?Essential hypertension ?-Home meds include HCTZ 12.5 mg daily, losartan 50 mg daily  ?-Blood pressure was initially held for permissive hypertension.  ?-Resume losartan today.  Keep HCTZ on hold. ? ?Carotid artery disease s/p bl CEA ?CAD/CABG in 2005 ?Hyperlipidemia ?-Nonischemic Myoview in 2019. ?-Continue plavix/statin ?-LDL 55 ? ?Type 2 diabetes mellitus ?-A1c 6.1 on 03/01/2022 ?-Currently on sliding scale insulin with Accu-Cheks ?-Blood sugar level consistently less than 150. ?Recent Labs  ?Lab 03/01/22 ?1206 03/01/22 ?1600 03/01/22 ?2128 03/02/22 ?0603 03/02/22 ?1207  ?GLUCAP  141* 119* 118* 113* 99  ? ?Goals of care ?  Code Status: Full Code  ? ? ?Mobility: PT eval obtained ? ?Nutritional status:  ?Body mass index is 29.03 kg/m?.  ?  ?  ? ? ? ? ?Diet:  ?Diet Order   ? ?       ?  Diet regular Room service appropriate? Yes; Fluid consistency: Thin   Diet effective now       ?  ? ?  ?  ? ?  ? ? ?DVT prophylaxis:  ?enoxaparin (LOVENOX) injection 40 mg Start: 02/28/22 2015 ?  ?Antimicrobials: Omnicef ?Fluid: None ?Consultants: Neurology ?Family Communication: Wife was at bedside ? ?Status is: Inpatient ? ?Level of care: Telemetry Medical  ? ?Dispo: The patient is from: Home  ?             Anticipated d/c is to: Yesterday, PT recommendation was for SNF versus inpatient rehab. ?Based on patient's performance today, therapy thinks he can improve over the weekend to be eligible for home health PT. if no improvement, may need SNF process started. ?             Patient currently is not medically stable to d/c. ?  Difficult to place patient No ? ? ? ?Infusions:  ? ? ? ?Scheduled Meds: ? aspirin  81 mg Oral Daily  ? atorvastatin  10 mg Oral Daily  ? cefdinir  300 mg Oral Q12H  ? clopidogrel  75 mg Oral Daily  ? enoxaparin (LOVENOX) injection  40 mg Subcutaneous Q24H  ? guaiFENesin  600 mg Oral BID  ? insulin aspart  0-9 Units Subcutaneous TID WC  ? losartan  50 mg Oral Daily  ? vitamin B-12  1,000 mcg Oral Daily  ? ? ?PRN meds: ?acetaminophen **OR** acetaminophen (TYLENOL) oral liquid 160 mg/5 mL **OR** acetaminophen, hydrALAZINE  ? ?Antimicrobials: ?Anti-infectives (From admission, onward)  ? ? Start     Dose/Rate Route Frequency Ordered Stop  ? 03/02/22 2200  cefdinir (OMNICEF) capsule 300 mg       ? 300 mg Oral Every 12 hours 03/02/22 1549    ? 03/01/22 0800  azithromycin (ZITHROMAX) 500 mg in sodium chloride 0.9 % 250 mL IVPB       ? 500 mg ?250 mL/hr over 60 Minutes Intravenous Every 24 hours 02/28/22 2104 03/02/22 0944  ? 02/28/22 1915  cefTRIAXone (ROCEPHIN) 1 g in sodium chloride 0.9 % 100 mL IVPB  Status:  Discontinued       ? 1 g ?200 mL/hr over 30 Minutes Intravenous Every 24 hours 02/28/22 1908 03/02/22 1549  ? ?  ? ? ?Objective: ?Vitals:  ? 03/02/22 0847 03/02/22 1204  ?BP: (!) 139/59 (!) 151/66  ?Pulse: 76 68  ?Resp: 17 18  ?Temp: 98 ?F (36.7 ?C) 99.4 ?F  (37.4 ?C)  ?SpO2: 97% 93%  ? ? ?Intake/Output Summary (Last 24 hours) at 03/02/2022 1555 ?Last data filed at 03/02/2022 0859 ?Gross per 24 hour  ?Intake 156.53 ml  ?Output 875 ml  ?Net -718.47 ml  ? ?Filed Weights  ? 02/28/22 1259  ?Weight: 99.8 kg  ? ?Weight change:  ?Body mass index is 29.03 kg/m?.  ? ?Physical Exam: ?General exam: Pleasant, elderly Caucasian male.  Not in distress ?Skin: No rashes, lesions or ulcers. ?HEENT: Atraumatic, normocephalic, no obvious bleeding ?Lungs: Clear to auscultation bilaterally ?CVS: Regular rate and rhythm, no murmur ?GI/Abd soft, nontender, nondistended, bowel sound present ?CNS: Alert, awake, oriented time 3, slurred speech  seems to be improving. ?Psychiatry: Mood appropriate ?Extremities: No pedal edema, no calf tenderness ? ?Data Review: I have personally reviewed the laboratory data and studies available. ? ?F/u labs ordered ?Unresulted Labs (From admission, onward)  ? ?  Start     Ordered  ? 02/28/22 1908  Culture, blood (routine x 2)  BLOOD CULTURE X 2,   R     ? 02/28/22 1908  ? ?  ?  ? ?  ? ? ?Signed, ?Terrilee Croak, MD ?[facility] ?03/02/2022 ? ? ? ? ? ? ? ? ? ? ?  ?

## 2022-03-02 NOTE — Progress Notes (Signed)
Speech Language Pathology Treatment: Dysphagia  ?Patient Details ?Name: [name] [name] ?MRN: [id number] ?DOB: 03-31-1941 ?Today's Date: 03/02/2022 ?Time: 4469-5072 ?SLP Time Calculation (min) (ACUTE ONLY): 13 min ? ?Assessment / Plan / Recommendation ?Clinical Impression ? Pt was seen for dysphagia treatment with his wife present. Pt was able to recall all the recommendations from yesterday's swallow study and reported that he has been using the chin tuck posture. Pt has a baseline cough and this was intermittently noted throughout the evaluation. This was also demonstrated during the MBS, but did not correlate with aspiration then. Pt tolerated thin liquids via cup without overt s/sx of aspiration. Pt required occasional cues to demonstrate a chin tuck posture, but was responsive to visual cues. Pt's current diet will be continued and SLP will continue to follow pt.   ?  ?HPI HPI: [name] [name] is a 81 y.o. male with medical history significant of hx of thalamic stroke in 03/2021, HTN, HLD, T2DM, CAD s/p CABG 2005, carotid artery disease who presented to ED with left sided weakness/leg weakness and slurred speech.  Pt coughing and choking with eating that has been worse over the past 2-3 days. MRI Acute infarct right corona radiata. ?  ?   ?SLP Plan ? Continue with current plan of care ? ?  ?  ?Recommendations for follow up therapy are one component of a multi-disciplinary discharge planning process, led by the attending physician.  Recommendations may be updated based on patient status, additional functional criteria and insurance authorization. ?  ? ?Recommendations  ?Diet recommendations: Regular;Thin liquid ?Liquids provided via: Cup ?Medication Administration: Whole meds with puree ?Supervision: Patient able to self feed;Intermittent supervision to cue for compensatory strategies ?Compensations: Minimize environmental distractions;Slow rate;Small sips/bites;Chin tuck ?Postural Changes and/or Swallow  Maneuvers: Seated upright 90 degrees  ?   ?    ?   ? ? ? ? Oral Care Recommendations: Oral care BID ?Follow Up Recommendations:  (TBD) ?Assistance recommended at discharge: Intermittent Supervision/Assistance ?SLP Visit Diagnosis: Dysphagia, oropharyngeal phase (R13.12) ?Plan: Continue with current plan of care ? ? ? ? ?  ?  ?Shannette Tabares I. Hardin Negus, [place], CCC-SLP ?Acute [facility] Services ?Office number [phone] ?Pager [phone] ? ? ?[name] ? ?03/02/2022, 11:02 AM ? ? ? ?

## 2022-03-03 DIAGNOSIS — I6522 Occlusion and stenosis of left carotid artery: Secondary | ICD-10-CM

## 2022-03-03 DIAGNOSIS — R2990 Unspecified symptoms and signs involving the nervous system: Secondary | ICD-10-CM

## 2022-03-03 DIAGNOSIS — I2510 Atherosclerotic heart disease of native coronary artery without angina pectoris: Secondary | ICD-10-CM

## 2022-03-03 DIAGNOSIS — J690 Pneumonitis due to inhalation of food and vomit: Secondary | ICD-10-CM

## 2022-03-03 DIAGNOSIS — E119 Type 2 diabetes mellitus without complications: Secondary | ICD-10-CM

## 2022-03-03 DIAGNOSIS — E7849 Other hyperlipidemia: Secondary | ICD-10-CM

## 2022-03-03 LAB — GLUCOSE, CAPILLARY
Glucose-Capillary: 98 mg/dL (ref 70–99)
Glucose-Capillary: 144 mg/dL — ABNORMAL HIGH (ref 70–99)
Glucose-Capillary: 106 mg/dL — ABNORMAL HIGH (ref 70–99)

## 2022-03-03 NOTE — Progress Notes (Signed)
PROGRESS NOTE    [name] [name]  JGG:[id number] DOB: 11-11-1941 DOA: 02/28/2022 PCP: Pleas Koch, NP   Brief Narrative:  [name] [name] is a 81 y.o. male with PMH significant for thalamic stroke in 03/2021, HTN, HLD, T2DM, CAD s/p CABG 2005, carotid artery disease s/p bl CEA. Patient was brought to the ED from home with new weakness in the left side, slurred speech for more than 24 hours. Patient saw his PCP on Monday 2/27 for low-grade fever, coughing and was treated with Z-Pak.   In the ED, patient was afebrile, blood pressure 135/53, heart rate 66, breathing on room air.   CT head did not show an acute finding but showed multiple remote lacunar infarcts on background of parenchymal volume loss and chronic white matter microangiopathy.    Code stroke was called.  Seen by neurology. Admitted to hospitalist service   Assessment & Plan:   Principal Problem:   Acute cerebrovascular accident (CVA) [facility]) Active Problems:   Essential hypertension   Coronary atherosclerosis   Type 2 diabetes mellitus ([place])   Carotid artery disease ([place])   Hyperlipidemia   Aspiration pneumonia vs. CAP   Stroke-like symptoms   Acute cerebrovascular accident  Right coronary radiata infarct Multiple remote lacunar infarcts Chronic white matter microangiopathy -Presented with acute left-sided weakness, facial droop and slurred speech.   -History of thalamic stroke in 03/2021.   -CT scan of head did not show any acute finding.   -MRI brain showed acute right coronary to infarct -CTA head and neck did not show any large vessel occlusion or stenosis -LDL 55, A1c 6.1 -Neurology consult appreciated. -Echocardiogram showed EF of 55 to 60% with grade 1 diastolic dysfunction, no interatrial shunt.. -Prior to admission, patient was on Plavix 75 mg daily.  Neurology recommended DAPT with aspirin 81 mg daily and Plavix 75 mg daily for 3 months after which patient will transition back to Plavix 75 mg  daily. -PT/OT eval obtained will continue to treat over the weekend -Patient expressed interest in going home with PT rather than skilled nursing facility -We will reevaluate Monday   Aspiration pneumonia vs. CAP -Hx of URI with low grade fever and productive cough.  -Recently seen at PCP and given azithromycin -CXR concerning for worsening consolidation in LLL.  -Concern for possible aspiration with history of coughing after swallowing  -Speech therapy evaluation obtained.  Regular consistency diet recommended.   -No fever, WBC count normal, procalcitonin level normal, abnormal chest x-ray. -blood cultures did not show any growth. -Currently on IV Rocephin.  Switched to oral Omnicef to complete 5 days of antibiotics last dose 3/7  Essential hypertension -Home meds include HCTZ 12.5 mg daily, losartan 50 mg daily  -Blood pressure was initially held for permissive hypertension.  - Keep HCTZ on hold.   Carotid artery disease s/p bl CEA CAD/CABG in 2005 Hyperlipidemia -Nonischemic Myoview in 2019. -Continue plavix/statin -LDL 55   Type 2 diabetes mellitus -A1c 6.1 on 03/01/2022 -Currently on sliding scale insulin with Accu-Cheks -Blood sugar level consistently less than 150.  DVT prophylaxis: Lovenox SQ  Code Status: Full    Code Status Orders  (From admission, onward)           Start     Ordered   02/28/22 2005  Full code  Continuous        02/28/22 2005           Code Status History     Date Active Date Inactive Code  Status Order ID Comments User Context   05/18/2021 1312 05/19/2021 1622 Full Code [id number]  Marval Regal Inpatient   04/06/2021 1336 04/12/2021 1536 Full Code [id number]  Elizabeth Sauer Inpatient   04/06/2021 1336 04/06/2021 1336 Full Code [id number]  Elizabeth Sauer Inpatient   04/03/2021 1583 04/06/2021 1333 Full Code [id number]  Karmen Bongo, MD ED   08/26/2015 1255 08/27/2015 1233 Full Code [id number]  Ulyses Amor, PA-C  Inpatient      Family Communication: wife at bedside  Disposition Plan:   snf vs home with HHPT Consults called: None Admission status: Inpatient   Consultants:  NEURO  Procedures:  CT ANGIO HEAD NECK W WO CM  Result Date: 03/01/2022 CLINICAL DATA:  Infarct seen in the right corona radiata EXAM: CT ANGIOGRAPHY HEAD AND NECK TECHNIQUE: Multidetector CT imaging of the head and neck was performed using the standard protocol during bolus administration of intravenous contrast. Multiplanar CT image reconstructions and MIPs were obtained to evaluate the vascular anatomy. Carotid stenosis measurements (when applicable) are obtained utilizing NASCET criteria, using the distal internal carotid diameter as the denominator. RADIATION DOSE REDUCTION: This exam was performed according to the departmental dose-optimization program which includes automated exposure control, adjustment of the mA and/or kV according to patient size and/or use of iterative reconstruction technique. CONTRAST:  69m OMNIPAQUE IOHEXOL 350 MG/ML SOLN COMPARISON:  CT head and brain MRI dated 1 day prior, CTA head neck 04/03/2021 FINDINGS: CTA NECK FINDINGS Aortic arch: There is mild calcified atherosclerotic plaque of the aortic arch. The origins of the major branch vessels are patent. The subclavian arteries are patent to the level imaged. Right carotid system: The patient is status post right carotid endarterectomy. The right common, internal, and external carotid arteries are patent, without hemodynamically significant stenosis or occlusion. There is no dissection or aneurysm. There is mild plaque at the bifurcation. Left carotid system: The left common carotid artery is patent with scattered mild calcified plaque but no hemodynamically significant stenosis. There has been interval left carotid endarterectomy since 04/03/2021. There is no residual hemodynamically significant stenosis or occlusion. The left external carotid artery is  patent. There is no dissection or aneurysm. Vertebral arteries: There is moderate stenosis at the origin of the right vertebral artery, similar to the prior study. Remainder of the right vertebral artery is widely patent. There is mild stenosis of the left vertebral artery origin. The left vertebral artery is otherwise widely patent. There is no evidence of dissection or aneurysm. Skeleton: There is mild degenerative change at C5-C6. There is calcification of the nuchal ligament. There is no acute osseous abnormality or aggressive osseous lesion. There is thickening of the posterior longitudinal ligament particularly over the C2 vertebral body/dens resulting in at least moderate spinal canal stenosis, similar to the prior study. Other neck: The soft tissues are otherwise unremarkable. Surgical clips are noted in the left neck Upper chest: Median sternotomy wires and mediastinal surgical clips are noted. Imaged lung apices are clear. Review of the MIP images confirms the above findings CTA HEAD FINDINGS Anterior circulation: There is calcified atherosclerotic plaque of the bilateral intracranial ICAs resulting in up to mild-to-moderate stenosis on the left and no greater than mild stenosis on the right. The bilateral MCAs are patent. The bilateral ACAs are patent. The left A1 segment is hypoplastic or absent, likely developmental variant. There is no aneurysm or AVM. Posterior circulation: The bilateral V4 segments are patent. PICA is identified bilaterally. The basilar artery is  patent. The bilateral PCAs are patent. There is a fetal origin of the left PCA, unchanged. A diminutive right posterior communicating artery is also seen. There is no aneurysm or AVM. Venous sinuses: As permitted by contrast timing, patent. Anatomic variants: As above. Review of the MIP images confirms the above findings IMPRESSION: 1. No emergent large vessel occlusion. 2. Interval left carotid endarterectomy since 04/03/2021 with no  residual hemodynamically significant stenosis or occlusion. Patient is also status post endarterectomy on the right with no significant stenosis or occlusion. 3. Unchanged moderate right and mild left vertebral artery origin stenosis. The vertebral arteries are otherwise widely patent in the neck. 4. Calcified atherosclerotic plaque in the intracranial ICAs resulting in up to mild-to-moderate stenosis on the left and no greater than mild stenosis on the right. Otherwise, patent intracranial vasculature. 5. Thickening of the posterior longitudinal ligament likely resulting in at least moderate spinal canal stenosis from C2 through C4. This could be further evaluated with MRI of the cervical spine as indicated. Electronically Signed   By: Valetta Mole M.D.   On: 03/01/2022 11:06   DG Chest 2 View  Result Date: 02/26/2022 CLINICAL DATA:  Productive cough EXAM: CHEST - 2 VIEW COMPARISON:  08/19/2018 FINDINGS: Cardiac size is within normal limits. There is previous coronary bypass surgery. There are linear densities in the right lower lung fields. There are no signs of pulmonary edema. There is no pleural effusion or pneumothorax. IMPRESSION: Small linear densities in the right lower lung fields may suggest scarring or subsegmental atelectasis. There are no signs of pulmonary edema or focal pulmonary consolidation. Electronically Signed   By: Elmer Picker M.D.   On: 02/26/2022 11:43   CT Head Wo Contrast  Result Date: 02/28/2022 CLINICAL DATA:  TIA, slurred speech EXAM: CT HEAD WITHOUT CONTRAST TECHNIQUE: Contiguous axial images were obtained from the base of the skull through the vertex without intravenous contrast. RADIATION DOSE REDUCTION: This exam was performed according to the departmental dose-optimization program which includes automated exposure control, adjustment of the mA and/or kV according to patient size and/or use of iterative reconstruction technique. COMPARISON:  Brain MRI 04/03/2021, CT  head 04/03/2021 FINDINGS: Brain: There is no evidence of acute intracranial hemorrhage, extra-axial fluid collection, or acute infarct. There is mild global parenchymal volume loss with prominence of the ventricular system and extra-axial CSF spaces. The ventricles are stable in size compared to the prior CT. There are remote lacunar infarcts in the bilateral basal ganglia, thalami, and corona radiata, and a remote cortical infarct in the high right frontal lobe in the precentral gyrus, unchanged (3-27). Otherwise, gray-white differentiation is preserved. There is no mass lesion.  There is no mass effect or midline shift. Vascular: There is calcification of the bilateral cavernous ICAs. Skull: Normal. Negative for fracture or focal lesion. Sinuses/Orbits: The imaged paranasal sinuses are clear. Bilateral lens implants are in place. The globes and orbits are otherwise unremarkable. Other: None. IMPRESSION: 1. No acute intracranial pathology. 2. Multiple remote lacunar infarcts on a background of parenchymal volume loss and chronic white matter microangiopathy as above. Electronically Signed   By: Valetta Mole M.D.   On: 02/28/2022 11:47   MR BRAIN WO CONTRAST  Result Date: 02/28/2022 CLINICAL DATA:  Acute neuro deficit. Slurred speech, left-sided weakness EXAM: MRI HEAD WITHOUT CONTRAST TECHNIQUE: Multiplanar, multiecho pulse sequences of the brain and surrounding structures were obtained without intravenous contrast. COMPARISON:  CT head 02/28/2022 FINDINGS: Brain: Motion degraded study Acute infarct in the right corona  radiata.  No other acute infarct Generalized atrophy. Chronic microvascular ischemic changes in the white matter and basal ganglia bilaterally. Negative for hemorrhage, mass, or hydrocephalus. Vascular: Normal arterial flow voids at the skull base Skull and upper cervical spine: No focal lesion. Sinuses/Orbits: Mild mucosal edema paranasal sinuses. Bilateral cataract extraction Other: None  IMPRESSION: Acute infarct right corona radiata. Atrophy and moderate chronic microvascular ischemic change Motion degraded study. Electronically Signed   By: Franchot Gallo M.D.   On: 02/28/2022 18:24   DG CHEST PORT 1 VIEW  Result Date: 02/28/2022 CLINICAL DATA:  Dysphagia. EXAM: PORTABLE CHEST 1 VIEW COMPARISON:  Chest radiograph dated 02/26/2022. FINDINGS: There is shallow inspiration. Cardiomegaly with vascular congestion. Left mid to lower lung field interstitial densities have progressed since the prior radiograph and may represent worsening edema or pneumonia. Right lung base atelectasis. No large pleural effusion. No pneumothorax. Stable cardiomediastinal silhouette. 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 No acute osseous pathology. IMPRESSION: 1. Cardiomegaly with vascular congestion. 2. Left mid to lower lung field interstitial densities have progressed since the prior radiograph and may represent worsening edema or pneumonia. Electronically Signed   By: Anner Crete M.D.   On: 02/28/2022 18:53   DG Swallowing Func-Speech Pathology  Result Date: 03/01/2022 Table formatting from the original result was not included. Objective Swallowing Evaluation: Type of Study: MBS-Modified Barium Swallow Study  Patient Details Name: PIETER FOOKS MRN: [id number] Date of Birth: 1941-07-11 Today's Date: 03/01/2022 Time: SLP Start Time (ACUTE ONLY): 1505 -SLP Stop Time (ACUTE ONLY): 1520 SLP Time Calculation (min) (ACUTE ONLY): 15 min Past Medical History: Past Medical History: Diagnosis Date  Basal cell carcinoma 11/09/2019  nod & infil-behind right ear-cx3 &exc  Basal cell carcinoma 03/21/2020  Residual BCC with peripheral margin involved - ST recommends MOHs  Carotid artery occlusion   Coronary artery disease   s/p CABG 2005; sees Dr Johnsie Cancel yearly  Diabetes mellitus without complication ([place])   Gynecomastia   Hypercholesterolemia   Hypertension   Neurodermatitis   Overweight(278.02)   Personal history  of colonic polyps 10/08/2006  tubular adenomas  Renal insufficiency   Stroke [facility])   TIA  April 2022  Transient ischemic attack 2010  "lasted ~ 5 seconds"  Vitamin D deficiency  Past Surgical History: Past Surgical History: Procedure Laterality Date  CARDIAC CATHETERIZATION  02/2004  "tried to stent; couldn't"  CAROTID ENDARTERECTOMY Right 08/26/2015  COLONOSCOPY    CORONARY ANGIOPLASTY    CORONARY ARTERY BYPASS GRAFT  Feb. 2005  4 vessel  ENDARTERECTOMY Right 08/26/2015  Procedure: Right Carotid ENDARTERECTOMY with Patch Angioplasty ;  Surgeon: Rosetta Posner, MD;  Location: [facility] OR;  Service: Vascular;  Laterality: Right;  ENDARTERECTOMY Left 05/18/2021  Procedure: LEFT CAROTID ARTERY ENDARTERECTOMY  with patch angioplasty;  Surgeon: Rosetta Posner, MD;  Location: [facility] OR;  Service: Vascular;  Laterality: Left;  EYE SURGERY    bilateral cataract  KELOID EXCISION  04/2008  on chest scar; Dr. Dessie Coma  KELOID EXCISION    PILONIDAL CYST EXCISION  1989 HPI: [name] [name] is a 81 y.o. male with medical history significant of hx of thalamic stroke in 03/2021, HTN, HLD, T2DM, CAD s/p CABG 2005, carotid artery disease who presented to ED with left sided weakness/leg weakness and slurred speech.  Pt coughing and choking with eating that has been worse over the past 2-3 days. MRI Acute infarct right corona radiata.  No data recorded  Recommendations for follow up therapy are one component of a  multi-disciplinary discharge planning process, led by the attending physician.  Recommendations may be updated based on patient status, additional functional criteria and insurance authorization. Assessment / Plan / Recommendation Clinical Impressions 03/01/2022 Clinical Impression Oral deficits were mild and marked by lingual residue and reduced coordination. Aspiration and mildly delayed swallow onset characterize the pharyngeal phase. Delayed initiation was short in duration and inconsistent with thin but contributed to penetration and  aspiration (PAS 2,3,5,8). When he consumed small sips, tucked his chin this this prevented airway intrusion for majority of trials. On two occasions there was penetration to vocal cords and once slipped below the cords. Mr. Milks also coughed hard prior to aspiration trial when airway was free of penetrates/aspirates. It is recommended he initiate a regular texture, thin liquids tucking his chin with liquids, pills whole in puree, no mixing consistencies orally and small sips. Therapist will follow. SLP Visit Diagnosis Dysphagia, oropharyngeal phase (R13.12) Attention and concentration deficit following -- Frontal lobe and executive function deficit following -- Impact on safety and function Mild aspiration risk;Moderate aspiration risk   Treatment Recommendations 03/01/2022 Treatment Recommendations Therapy as outlined in treatment plan below   Prognosis 03/01/2022 Prognosis for Safe Diet Advancement Good Barriers to Reach Goals -- Barriers/Prognosis Comment -- Diet Recommendations 03/01/2022 SLP Diet Recommendations Regular solids;Thin liquid Liquid Administration via Cup Medication Administration Whole meds with puree Compensations Minimize environmental distractions;Slow rate;Small sips/bites Postural Changes Seated upright at 90 degrees   Other Recommendations 03/01/2022 Recommended Consults -- Oral Care Recommendations Oral care BID Other Recommendations -- Follow Up Recommendations (No Data) Assistance recommended at discharge Intermittent Supervision/Assistance Functional Status Assessment Patient has had a recent decline in their functional status and demonstrates the ability to make significant improvements in function in a reasonable and predictable amount of time. Frequency and Duration  03/01/2022 Speech Therapy Frequency (ACUTE ONLY) min 2x/week Treatment Duration 2 weeks   Oral Phase 03/01/2022 Oral Phase Impaired Oral - Pudding Teaspoon -- Oral - Pudding Cup -- Oral - Honey Teaspoon -- Oral - Honey Cup -- Oral  - Nectar Teaspoon -- Oral - Nectar Cup -- Oral - Nectar Straw -- Oral - Thin Teaspoon -- Oral - Thin Cup Lingual/palatal residue Oral - Thin Straw -- Oral - Puree -- Oral - Mech Soft -- Oral - Regular WFL Oral - Multi-Consistency -- Oral - Pill -- Oral Phase - Comment --  Pharyngeal Phase 03/01/2022 Pharyngeal Phase Impaired Pharyngeal- Pudding Teaspoon -- Pharyngeal -- Pharyngeal- Pudding Cup -- Pharyngeal -- Pharyngeal- Honey Teaspoon -- Pharyngeal -- Pharyngeal- Honey Cup -- Pharyngeal -- Pharyngeal- Nectar Teaspoon -- Pharyngeal -- Pharyngeal- Nectar Cup -- Pharyngeal -- Pharyngeal- Nectar Straw -- Pharyngeal -- Pharyngeal- Thin Teaspoon -- Pharyngeal -- Pharyngeal- Thin Cup Pharyngeal residue - valleculae;Penetration/Aspiration during swallow Pharyngeal Material enters airway, remains ABOVE vocal cords then ejected out;Material enters airway, remains ABOVE vocal cords and not ejected out;Material enters airway, CONTACTS cords and not ejected out;Material enters airway, passes BELOW cords without attempt by patient to eject out (silent aspiration) Pharyngeal- Thin Straw -- Pharyngeal -- Pharyngeal- Puree -- Pharyngeal -- Pharyngeal- Mechanical Soft -- Pharyngeal -- Pharyngeal- Regular WFL Pharyngeal -- Pharyngeal- Multi-consistency -- Pharyngeal -- Pharyngeal- Pill -- Pharyngeal -- Pharyngeal Comment --  Cervical Esophageal Phase  03/01/2022 Cervical Esophageal Phase WFL Pudding Teaspoon -- Pudding Cup -- Honey Teaspoon -- Honey Cup -- Nectar Teaspoon -- Nectar Cup -- Nectar Straw -- Thin Teaspoon -- Thin Cup -- Thin Straw -- Puree -- Mechanical Soft -- Regular -- Multi-consistency -- Pill --  Cervical Esophageal Comment -- [name] 03/01/2022, 3:34 PM                     ECHOCARDIOGRAM COMPLETE  Result Date: 03/01/2022    ECHOCARDIOGRAM REPORT   Patient Name:   [name] [name] Date of Exam: 03/01/2022 Medical Rec #:  [id number]        Height:       73.0 in Accession #:    [id number]       Weight:        220.0 lb Date of Birth:  1941/07/24       BSA:          2.241 m Patient Age:    63 years         BP:           128/66 mmHg Patient Gender: M                HR:           80 bpm. Exam Location:  Inpatient Procedure: 2D Echo Indications:    Stroke  History:        Patient has prior history of Echocardiogram examinations, most                 recent 04/03/2021. CAD, Stroke; Risk Factors:Hypertension and                 Diabetes.  Sonographer:    Arlyss Gandy Referring Phys: [id number] Orma Flaming  Sonographer Comments: Image acquisition challenging due to patient body habitus. IMPRESSIONS  1. Left ventricular ejection fraction, by estimation, is 55 to 60%. The left ventricle has normal function. The left ventricle has no regional wall motion abnormalities. Left ventricular diastolic parameters are consistent with Grade I diastolic dysfunction (impaired relaxation).  2. Right ventricular systolic function is normal. The right ventricular size is normal.  3. The mitral valve is grossly normal. Trivial mitral valve regurgitation.  4. The aortic valve is tricuspid. Aortic valve regurgitation is trivial. Aortic valve sclerosis is present, with no evidence of aortic valve stenosis.  5. The inferior vena cava is dilated in size with >50% respiratory variability, suggesting right atrial pressure of 8 mmHg. Comparison(s): Changes from prior study are noted. 04/03/2021: LVEF 60-65%. FINDINGS  Left Ventricle: Left ventricular ejection fraction, by estimation, is 55 to 60%. The left ventricle has normal function. The left ventricle has no regional wall motion abnormalities. The left ventricular internal cavity size was normal in size. There is  no left ventricular hypertrophy. Left ventricular diastolic parameters are consistent with Grade I diastolic dysfunction (impaired relaxation). Indeterminate filling pressures. Right Ventricle: The right ventricular size is normal. No increase in right ventricular wall thickness. Right  ventricular systolic function is normal. Left Atrium: Left atrial size was normal in size. Right Atrium: Right atrial size was normal in size. Pericardium: There is no evidence of pericardial effusion. Mitral Valve: The mitral valve is grossly normal. Trivial mitral valve regurgitation. Tricuspid Valve: The tricuspid valve is grossly normal. Tricuspid valve regurgitation is trivial. Aortic Valve: The aortic valve is tricuspid. Aortic valve regurgitation is trivial. Aortic valve sclerosis is present, with no evidence of aortic valve stenosis. Aortic valve mean gradient measures 3.0 mmHg. Aortic valve peak gradient measures 6.2 mmHg. Aortic valve area, by VTI measures 2.88 cm. Pulmonic Valve: The pulmonic valve was normal in structure. Pulmonic valve regurgitation is not visualized. Aorta: The aortic root and ascending aorta are structurally normal, with  no evidence of dilitation. Venous: The inferior vena cava is dilated in size with greater than 50% respiratory variability, suggesting right atrial pressure of 8 mmHg. IAS/Shunts: No atrial level shunt detected by color flow Doppler.  LEFT VENTRICLE PLAX 2D LVIDd:         5.10 cm   Diastology LVIDs:         3.60 cm   LV e' medial:    5.11 cm/s LV PW:         0.70 cm   LV E/e' medial:  16.0 LV IVS:        1.00 cm   LV e' lateral:   7.94 cm/s LVOT diam:     2.10 cm   LV E/e' lateral: 10.3 LV SV:         80 LV SV Index:   36 LVOT Area:     3.46 cm  RIGHT VENTRICLE            IVC RV Basal diam:  3.90 cm    IVC diam: 2.20 cm RV Mid diam:    3.10 cm RV S prime:     8.38 cm/s TAPSE (M-mode): 1.2 cm LEFT ATRIUM             Index        RIGHT ATRIUM           Index LA diam:        4.40 cm 1.96 cm/m   RA Area:     16.60 cm LA Vol (A2C):   64.0 ml 28.56 ml/m  RA Volume:   41.10 ml  18.34 ml/m LA Vol (A4C):   72.2 ml 32.22 ml/m LA Biplane Vol: 69.3 ml 30.93 ml/m  AORTIC VALVE AV Area (Vmax):    2.85 cm AV Area (Vmean):   2.58 cm AV Area (VTI):     2.88 cm AV Vmax:            124.00 cm/s AV Vmean:          85.100 cm/s AV VTI:            0.278 m AV Peak Grad:      6.2 mmHg AV Mean Grad:      3.0 mmHg LVOT Vmax:         102.00 cm/s LVOT Vmean:        63.400 cm/s LVOT VTI:          0.231 m LVOT/AV VTI ratio: 0.83  AORTA Ao Root diam: 2.90 cm MITRAL VALVE MV Area (PHT): 3.77 cm    SHUNTS MV Decel Time: 201 msec    Systemic VTI:  0.23 m MV E velocity: 81.80 cm/s  Systemic Diam: 2.10 cm MV A velocity: 72.80 cm/s MV E/A ratio:  1.12 Lyman Bishop MD Electronically signed by Lyman Bishop MD Signature Date/Time: 03/01/2022/12:13:10 PM    Final      Subjective: Pt reports he feels like he is improving with decreasing deficits  Objective: Vitals:   03/02/22 2100 03/03/22 0016 03/03/22 0317 03/03/22 1138  BP: (!) 149/103 (!) 149/60 139/66 137/62  Pulse: 69 62 97 66  Resp:  17  18  Temp: 98.2 F (36.8 C) 98.5 F (36.9 C) 97.7 F (36.5 C) 98.3 F (36.8 C)  TempSrc: Oral Oral Oral   SpO2: 96% 96% 96%   Weight:      Height:        Intake/Output Summary (Last 24 hours) at 03/03/2022 1153 Last data filed  at 03/03/2022 0618 Gross per 24 hour  Intake 177 ml  Output 325 ml  Net -148 ml   Filed Weights   02/28/22 1259  Weight: 99.8 kg    Examination:  General exam: Appears calm and comfortable  Respiratory system: Clear to auscultation. Respiratory effort normal. Cardiovascular system: S1 & S2 heard, RRR. No JVD, murmurs, rubs, gallops or clicks. No pedal edema. Gastrointestinal system: Abdomen is nondistended, soft and nontender. No organomegaly or masses felt. Normal bowel sounds heard. Central nervous system: Alert, oriented, still with slurred speech but improving asymmetrical deficits although improving Extremities: Warm well perfused, no edema. Skin: No rashes, lesions or ulcers Psychiatry: Judgement and insight appear normal. Mood & affect appropriate.     Data Reviewed: I have personally reviewed following labs and imaging studies  CBC: Recent Labs   Lab 02/28/22 1100 03/02/22 0438  WBC 6.5 6.7  NEUTROABS 4.5 4.3  HGB 15.1 13.8  HCT 43.4 40.1  MCV 100.9* 100.0  PLT 226 588   Basic Metabolic Panel: Recent Labs  Lab 02/28/22 1100 03/02/22 0438  NA 138 137  K 3.8 3.4*  CL 102 102  CO2 28 29  GLUCOSE 120* 107*  BUN 15 15  CREATININE 1.14 1.10  CALCIUM 8.8* 8.3*   GFR: Estimated Creatinine Clearance: 68.8 mL/min (by C-G formula based on SCr of 1.1 mg/dL). Liver Function Tests: Recent Labs  Lab 02/28/22 1100  AST 19  ALT 17  ALKPHOS 86  BILITOT 1.1  PROT 6.7  ALBUMIN 3.6   No results for input(s): LIPASE, AMYLASE in the last 168 hours. No results for input(s): AMMONIA in the last 168 hours. Coagulation Profile: Recent Labs  Lab 02/28/22 1100  INR 1.0   Cardiac Enzymes: No results for input(s): CKTOTAL, CKMB, CKMBINDEX, TROPONINI in the last 168 hours. BNP (last 3 results) Recent Labs    08/03/21 0000  PROBNP 300   HbA1C: Recent Labs    03/01/22 0615  HGBA1C 6.1*   CBG: Recent Labs  Lab 03/02/22 0603 03/02/22 1207 03/02/22 1740 03/02/22 2117 03/03/22 0609  GLUCAP 113* 99 123* 104* 98   Lipid Profile: Recent Labs    03/01/22 0615  CHOL 105  HDL 26*  LDLCALC 55  TRIG 120  CHOLHDL 4.0   Thyroid Function Tests: No results for input(s): TSH, T4TOTAL, FREET4, T3FREE, THYROIDAB in the last 72 hours. Anemia Panel: No results for input(s): VITAMINB12, FOLATE, FERRITIN, TIBC, IRON, RETICCTPCT in the last 72 hours. Sepsis Labs: Recent Labs  Lab 02/28/22 2102 03/01/22 0615 03/02/22 0438  PROCALCITON <0.10 <0.10 <0.10    Recent Results (from the past 240 hour(s))  Resp Panel by RT-PCR (Flu A&B, Covid) Nasopharyngeal Swab     Status: None   Collection Time: 02/28/22  3:58 PM   Specimen: Nasopharyngeal Swab; Nasopharyngeal(NP) swabs in vial transport medium  Result Value Ref Range Status   SARS Coronavirus 2 by RT PCR NEGATIVE NEGATIVE Final    Comment: (NOTE) SARS-CoV-2 target  nucleic acids are NOT DETECTED.  The SARS-CoV-2 RNA is generally detectable in upper respiratory specimens during the acute phase of infection. The lowest concentration of SARS-CoV-2 viral copies this assay can detect is 138 copies/mL. A negative result does not preclude SARS-Cov-2 infection and should not be used as the sole basis for treatment or other patient management decisions. A negative result may occur with  improper specimen collection/handling, submission of specimen other than nasopharyngeal swab, presence of viral mutation(s) within the areas targeted by this assay,  and inadequate number of viral copies(<138 copies/mL). A negative result must be combined with clinical observations, patient history, and epidemiological information. The expected result is Negative.  Fact Sheet for Patients:  EntrepreneurPulse.com.au  Fact Sheet for Healthcare Providers:  IncredibleEmployment.be  This test is no t yet approved or cleared by the [place] FDA and  has been authorized for detection and/or diagnosis of SARS-CoV-2 by FDA under an Emergency Use Authorization (EUA). This EUA will remain  in effect (meaning this test can be used) for the duration of the COVID-19 declaration under Section 564(b)(1) of the Act, 21 U.S.C.section 360bbb-3(b)(1), unless the authorization is terminated  or revoked sooner.       Influenza A by PCR NEGATIVE NEGATIVE Final   Influenza B by PCR NEGATIVE NEGATIVE Final    Comment: (NOTE) The Xpert Xpress SARS-CoV-2/FLU/RSV plus assay is intended as an aid in the diagnosis of influenza from Nasopharyngeal swab specimens and should not be used as a sole basis for treatment. Nasal washings and aspirates are unacceptable for Xpert Xpress SARS-CoV-2/FLU/RSV testing.  Fact Sheet for Patients: EntrepreneurPulse.com.au  Fact Sheet for Healthcare  Providers: IncredibleEmployment.be  This test is not yet approved or cleared by the [place] FDA and has been authorized for detection and/or diagnosis of SARS-CoV-2 by FDA under an Emergency Use Authorization (EUA). This EUA will remain in effect (meaning this test can be used) for the duration of the COVID-19 declaration under Section 564(b)(1) of the Act, 21 U.S.C. section 360bbb-3(b)(1), unless the authorization is terminated or revoked.  Performed at [facility], [place] [address], [place], [place] [address]   Respiratory (~20 pathogens) panel by PCR     Status: None   Collection Time: 02/28/22  3:58 PM   Specimen: Nasopharyngeal Swab; Respiratory  Result Value Ref Range Status   Adenovirus NOT DETECTED NOT DETECTED Final   Coronavirus 229E NOT DETECTED NOT DETECTED Final    Comment: (NOTE) The Coronavirus on the Respiratory Panel, DOES NOT test for the novel  Coronavirus (2019 nCoV)    Coronavirus HKU1 NOT DETECTED NOT DETECTED Final   Coronavirus NL63 NOT DETECTED NOT DETECTED Final   Coronavirus OC43 NOT DETECTED NOT DETECTED Final   Metapneumovirus NOT DETECTED NOT DETECTED Final   Rhinovirus / Enterovirus NOT DETECTED NOT DETECTED Final   Influenza A NOT DETECTED NOT DETECTED Final   Influenza B NOT DETECTED NOT DETECTED Final   Parainfluenza Virus 1 NOT DETECTED NOT DETECTED Final   Parainfluenza Virus 2 NOT DETECTED NOT DETECTED Final   Parainfluenza Virus 3 NOT DETECTED NOT DETECTED Final   Parainfluenza Virus 4 NOT DETECTED NOT DETECTED Final   Respiratory Syncytial Virus NOT DETECTED NOT DETECTED Final   Bordetella pertussis NOT DETECTED NOT DETECTED Final   Bordetella Parapertussis NOT DETECTED NOT DETECTED Final   Chlamydophila pneumoniae NOT DETECTED NOT DETECTED Final   Mycoplasma pneumoniae NOT DETECTED NOT DETECTED Final    Comment: Performed at [facility], [place]. [address], [place], [place] [address]  Culture, blood  (routine x 2)     Status: None (Preliminary result)   Collection Time: 02/28/22  9:02 PM   Specimen: BLOOD LEFT HAND  Result Value Ref Range Status   Specimen Description BLOOD LEFT HAND  Final   Special Requests   Final    BOTTLES DRAWN AEROBIC AND ANAEROBIC Blood Culture adequate volume   Culture   Final    NO GROWTH 3 DAYS Performed at [facility], [address] [place], [place] [address]    Report Status PENDING  Incomplete         Radiology Studies: DG Swallowing Func-Speech Pathology  Result Date: 03/01/2022 Table formatting from the original result was not included. Objective Swallowing Evaluation: Type of Study: MBS-Modified Barium Swallow Study  Patient Details Name: [name] [name] MRN: [id number] Date of Birth: 1941/02/23 Today's Date: 03/01/2022 Time: SLP Start Time (ACUTE ONLY): 1505 -SLP Stop Time (ACUTE ONLY): 1520 SLP Time Calculation (min) (ACUTE ONLY): 15 min Past Medical History: Past Medical History: Diagnosis Date  Basal cell carcinoma 11/09/2019  nod & infil-behind right ear-cx3 &exc  Basal cell carcinoma 03/21/2020  Residual BCC with peripheral margin involved - ST recommends MOHs  Carotid artery occlusion   Coronary artery disease   s/p CABG 2005; sees Dr Johnsie Cancel yearly  Diabetes mellitus without complication ([place])   Gynecomastia   Hypercholesterolemia   Hypertension   Neurodermatitis   Overweight(278.02)   Personal history of colonic polyps 10/08/2006  tubular adenomas  Renal insufficiency   Stroke [facility])   TIA  April 2022  Transient ischemic attack 2010  "lasted ~ 5 seconds"  Vitamin D deficiency  Past Surgical History: Past Surgical History: Procedure Laterality Date  CARDIAC CATHETERIZATION  02/2004  "tried to stent; couldn't"  CAROTID ENDARTERECTOMY Right 08/26/2015  COLONOSCOPY    CORONARY ANGIOPLASTY    CORONARY ARTERY BYPASS GRAFT  Feb. 2005  4 vessel  ENDARTERECTOMY Right 08/26/2015  Procedure: Right Carotid ENDARTERECTOMY with Patch Angioplasty ;  Surgeon: Rosetta Posner, MD;  Location: [facility] OR;  Service: Vascular;  Laterality: Right;  ENDARTERECTOMY Left 05/18/2021  Procedure: LEFT CAROTID ARTERY ENDARTERECTOMY  with patch angioplasty;  Surgeon: Rosetta Posner, MD;  Location: [facility] OR;  Service: Vascular;  Laterality: Left;  EYE SURGERY    bilateral cataract  KELOID EXCISION  04/2008  on chest scar; Dr. Dessie Coma  KELOID EXCISION    PILONIDAL CYST EXCISION  1989 HPI: LEE KUANG is a 81 y.o. male with medical history significant of hx of thalamic stroke in 03/2021, HTN, HLD, T2DM, CAD s/p CABG 2005, carotid artery disease who presented to ED with left sided weakness/leg weakness and slurred speech.  Pt coughing and choking with eating that has been worse over the past 2-3 days. MRI Acute infarct right corona radiata.  No data recorded  Recommendations for follow up therapy are one component of a multi-disciplinary discharge planning process, led by the attending physician.  Recommendations may be updated based on patient status, additional functional criteria and insurance authorization. Assessment / Plan / Recommendation Clinical Impressions 03/01/2022 Clinical Impression Oral deficits were mild and marked by lingual residue and reduced coordination. Aspiration and mildly delayed swallow onset characterize the pharyngeal phase. Delayed initiation was short in duration and inconsistent with thin but contributed to penetration and aspiration (PAS 2,3,5,8). When he consumed small sips, tucked his chin this this prevented airway intrusion for majority of trials. On two occasions there was penetration to vocal cords and once slipped below the cords. Mr. Hudler also coughed hard prior to aspiration trial when airway was free of penetrates/aspirates. It is recommended he initiate a regular texture, thin liquids tucking his chin with liquids, pills whole in puree, no mixing consistencies orally and small sips. Therapist will follow. SLP Visit Diagnosis Dysphagia, oropharyngeal phase  (R13.12) Attention and concentration deficit following -- Frontal lobe and executive function deficit following -- Impact on safety and function Mild aspiration risk;Moderate aspiration risk   Treatment  Recommendations 03/01/2022 Treatment Recommendations Therapy as outlined in treatment plan below   Prognosis 03/01/2022 Prognosis for Safe Diet Advancement Good Barriers to Reach Goals -- Barriers/Prognosis Comment -- Diet Recommendations 03/01/2022 SLP Diet Recommendations Regular solids;Thin liquid Liquid Administration via Cup Medication Administration Whole meds with puree Compensations Minimize environmental distractions;Slow rate;Small sips/bites Postural Changes Seated upright at 90 degrees   Other Recommendations 03/01/2022 Recommended Consults -- Oral Care Recommendations Oral care BID Other Recommendations -- Follow Up Recommendations (No Data) Assistance recommended at discharge Intermittent Supervision/Assistance Functional Status Assessment Patient has had a recent decline in their functional status and demonstrates the ability to make significant improvements in function in a reasonable and predictable amount of time. Frequency and Duration  03/01/2022 Speech Therapy Frequency (ACUTE ONLY) min 2x/week Treatment Duration 2 weeks   Oral Phase 03/01/2022 Oral Phase Impaired Oral - Pudding Teaspoon -- Oral - Pudding Cup -- Oral - Honey Teaspoon -- Oral - Honey Cup -- Oral - Nectar Teaspoon -- Oral - Nectar Cup -- Oral - Nectar Straw -- Oral - Thin Teaspoon -- Oral - Thin Cup Lingual/palatal residue Oral - Thin Straw -- Oral - Puree -- Oral - Mech Soft -- Oral - Regular WFL Oral - Multi-Consistency -- Oral - Pill -- Oral Phase - Comment --  Pharyngeal Phase 03/01/2022 Pharyngeal Phase Impaired Pharyngeal- Pudding Teaspoon -- Pharyngeal -- Pharyngeal- Pudding Cup -- Pharyngeal -- Pharyngeal- Honey Teaspoon -- Pharyngeal -- Pharyngeal- Honey Cup -- Pharyngeal -- Pharyngeal- Nectar Teaspoon -- Pharyngeal -- Pharyngeal-  Nectar Cup -- Pharyngeal -- Pharyngeal- Nectar Straw -- Pharyngeal -- Pharyngeal- Thin Teaspoon -- Pharyngeal -- Pharyngeal- Thin Cup Pharyngeal residue - valleculae;Penetration/Aspiration during swallow Pharyngeal Material enters airway, remains ABOVE vocal cords then ejected out;Material enters airway, remains ABOVE vocal cords and not ejected out;Material enters airway, CONTACTS cords and not ejected out;Material enters airway, passes BELOW cords without attempt by patient to eject out (silent aspiration) Pharyngeal- Thin Straw -- Pharyngeal -- Pharyngeal- Puree -- Pharyngeal -- Pharyngeal- Mechanical Soft -- Pharyngeal -- Pharyngeal- Regular WFL Pharyngeal -- Pharyngeal- Multi-consistency -- Pharyngeal -- Pharyngeal- Pill -- Pharyngeal -- Pharyngeal Comment --  Cervical Esophageal Phase  03/01/2022 Cervical Esophageal Phase WFL Pudding Teaspoon -- Pudding Cup -- Honey Teaspoon -- Honey Cup -- Nectar Teaspoon -- Nectar Cup -- Nectar Straw -- Thin Teaspoon -- Thin Cup -- Thin Straw -- Puree -- Mechanical Soft -- Regular -- Multi-consistency -- Pill -- Cervical Esophageal Comment -- [name] 03/01/2022, 3:34 PM                          Scheduled Meds:  aspirin  81 mg Oral Daily   atorvastatin  10 mg Oral Daily   cefdinir  300 mg Oral Q12H   clopidogrel  75 mg Oral Daily   enoxaparin (LOVENOX) injection  40 mg Subcutaneous Q24H   guaiFENesin  600 mg Oral BID   insulin aspart  0-9 Units Subcutaneous TID WC   losartan  50 mg Oral Daily   vitamin B-12  1,000 mcg Oral Daily   Continuous Infusions:   LOS: 2 days    Time spent: 35 minutes    Nicolette Bang, MD [facility]  If 7PM-7AM, please contact night-coverage  03/03/2022, 11:53 AM

## 2022-03-03 NOTE — Progress Notes (Signed)
Physical Therapy Treatment ?Patient Details ?Name: [name] [name] ?MRN: [id number] ?DOB: 05-06-1941 ?Today's Date: 03/03/2022 ? ? ?History of Present Illness Pt is a 81 yo male presenting to ED 02/28/22 with slurred speech, L sided weakness, and L facial droop that has progressively worsened over the past several days. Imaging show R corona radiata CVA. CXR with worsening consolidation in LLL and concern for aspiration PNA. 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 ? ?  ?PT Comments  ? ? Pt seated in recliner on arrival.  He required max VCs to achieve standing.  He did progress gt distance, trialed stairs with little difficulty, and performed therapeutic exercises. He was able to pick object off floor x5 today.  Making good progress.  Has adequate support from wife and daughter at home.     ?Recommendations for follow up therapy are one component of a multi-disciplinary discharge planning process, led by the attending physician.  Recommendations may be updated based on patient status, additional functional criteria and insurance authorization. ? ?Follow Up Recommendations ? Home health PT ?  ?  ?Assistance Recommended at Discharge Frequent or constant Supervision/Assistance  ?Patient can return home with the following A little help with bathing/dressing/bathroom;Assistance with cooking/housework;Direct supervision/assist for medications management;Direct supervision/assist for financial management;Assist for transportation;Help with stairs or ramp for entrance;A little help with walking and/or transfers ?  ?Equipment Recommendations ? None recommended by PT  ?  ?Recommendations for Other Services   ? ? ?  ?Precautions / Restrictions Precautions ?Precautions: Fall ?Restrictions ?Weight Bearing Restrictions: No  ?  ? ?Mobility ? Bed Mobility ?  ?  ?  ?  ?  ?  ?  ?General bed mobility comments: Pt seated in recliner on  arrival.  No chair alarm on. ?  ? ?Transfers ?Overall transfer level: Needs assistance ?Equipment used: Rolling walker (2 wheels) ?Transfers: Sit to/from Stand ?Sit to Stand: Min guard ?  ?  ?  ?  ?  ?General transfer comment: Pt unsuccessful initially.  He required cues to scoot forward to edge of seat and push with BUEs to rise into standing. ?  ? ?Ambulation/Gait ?Ambulation/Gait assistance: Min guard, Supervision ?Gait Distance (Feet): 200 Feet ?Assistive device: Rolling walker (2 wheels) ?Gait Pattern/deviations: Narrow base of support, Trunk flexed, Shuffle, Decreased stride length, Decreased dorsiflexion - left ?  ?  ?  ?General Gait Details: Cues for scapular retraction and upper trunk control.  Pt required cues for increasing BOS. ? ? ?Stairs ?Stairs: Yes ?Stairs assistance: Min guard ?Stair Management: Two rails, Forwards ?Number of Stairs: 4 ?General stair comments: Cues for sequencing and use of rails. ? ? ?Wheelchair Mobility ?  ? ?Modified Rankin (Stroke Patients Only) ?Modified Rankin (Stroke Patients Only) ?Pre-Morbid Rankin Score: Moderate disability ?Modified Rankin: Moderately severe disability ? ? ?  ?Balance Overall balance assessment: Needs assistance ?Sitting-balance support: Feet supported, No upper extremity supported ?Sitting balance-Leahy Scale: Fair ?  ?  ?  ?Standing balance-Leahy Scale: Poor ?Standing balance comment: min guard but balance is showing improvement.  Able to bend and squat to pick up trash off the floor x 5 with min guard assistance. ?  ?  ?  ?  ?  ?  ?  ?  ?  ?  ?  ?  ? ?  ?Cognition Arousal/Alertness: Awake/alert ?Behavior During Therapy: [facility] for tasks assessed/performed ?Overall Cognitive Status: History of cognitive impairments - at baseline ?  Area of Impairment: Attention, Safety/judgement, Awareness, Following commands ?  ?  ?  ?  ?  ?  ?  ?  ?  ?Current Attention Level: Sustained ?  ?Following Commands: Follows one step commands consistently, Follows multi-step  commands consistently ?Safety/Judgement: Decreased awareness of safety, Decreased awareness of deficits ?Awareness: Emergent ?Problem Solving: Requires verbal cues ?General Comments: Pt. oriented and excited to progress mobility, requires min cuing for balance tasks. Not fully aware of deficits. ?  ?  ? ?  ?Exercises General Exercises - Lower Extremity ?Hip Flexion/Marching: AROM, Both, 10 reps, Standing ?Heel Raises: AROM, Both, 10 reps, Standing ?Mini-Sqauts: AROM, Both, 10 reps, Standing ? ?  ?General Comments   ?  ?  ? ?Pertinent Vitals/Pain Pain Assessment ?Pain Assessment: No/denies pain  ? ? ?Home Living   ?  ?  ?  ?  ?  ?  ?  ?  ?  ?   ?  ?Prior Function    ?  ?  ?   ? ?PT Goals (current goals can now be found in the care plan section) Acute Rehab PT Goals ?Patient Stated Goal: Transition to home and then to outpatient PT when ready ?Potential to Achieve Goals: Good ?Progress towards PT goals: Progressing toward goals ? ?  ?Frequency ? ? ? Min 4X/week ? ? ? ?  ?PT Plan Current plan remains appropriate  ? ? ?Co-evaluation   ?  ?  ?  ?  ? ?  ?AM-PAC PT "6 Clicks" Mobility   ?Outcome Measure ? Help needed turning from your back to your side while in a flat bed without using bedrails?: None ?Help needed moving from lying on your back to sitting on the side of a flat bed without using bedrails?: None ?Help needed moving to and from a bed to a chair (including a wheelchair)?: A Little ?Help needed standing up from a chair using your arms (e.g., wheelchair or bedside chair)?: A Little ?Help needed to walk in hospital room?: A Little ?Help needed climbing 3-5 steps with a railing? : A Little ?6 Click Score: 20 ? ?  ?End of Session Equipment Utilized During Treatment: Gait belt ?Activity Tolerance: Patient tolerated treatment well ?Patient left: in chair;with call bell/phone within reach;with chair alarm set ?Nurse Communication: Mobility status (wants to ambulate more today to prepare for return home.) ?PT Visit  Diagnosis: Unsteadiness on feet (R26.81);Other abnormalities of gait and mobility (R26.89);Muscle weakness (generalized) (M62.81);Difficulty in walking, not elsewhere classified (R26.2) ?  ? ? ?Time: [phone] ?PT Time Calculation (min) (ACUTE ONLY): 23 min ? ?Charges:  $Gait Training: 8-22 mins ?$Therapeutic Exercise: 8-22 mins          ?          ? ?Haylyn Halberg R. , PTA ?Acute Rehabilitation Services ?Pager [phone] ?Office [phone] ? ? ? ?Isabel Ardila Eli Hose ?03/03/2022, 9:42 AM ? ?

## 2022-03-04 ENCOUNTER — Encounter (HOSPITAL_COMMUNITY): Payer: Self-pay | Admitting: Family Medicine

## 2022-03-04 DIAGNOSIS — I10 Essential (primary) hypertension: Secondary | ICD-10-CM

## 2022-03-04 LAB — GLUCOSE, CAPILLARY
Glucose-Capillary: 104 mg/dL — ABNORMAL HIGH (ref 70–99)
Glucose-Capillary: 118 mg/dL — ABNORMAL HIGH (ref 70–99)

## 2022-03-04 MED ORDER — AZITHROMYCIN 250 MG PO TABS: ORAL_TABLET | ORAL | 0 refills | Status: DC

## 2022-03-04 MED ORDER — CEFDINIR 300 MG PO CAPS: 300.0000 mg | ORAL_CAPSULE | Freq: Two times a day (BID) | ORAL | 0 refills | Status: AC

## 2022-03-04 MED ORDER — ASPIRIN 81 MG PO CHEW
81.0000 mg | CHEWABLE_TABLET | Freq: Every day | ORAL | 0 refills | Status: AC
Start: 2022-03-05 — End: 2022-06-03

## 2022-03-04 NOTE — Progress Notes (Signed)
Physical Therapy Treatment ?Patient Details ?Name: [name] [name] ?MRN: [id number] ?DOB: 1941-09-01 ?Today's Date: 03/04/2022 ? ? ?History of Present Illness Pt is a 80 yo male presenting to ED 02/28/22 with slurred speech, L sided weakness, and L facial droop that has progressively worsened over the past several days. Imaging show R corona radiata CVA. CXR with worsening consolidation in LLL and concern for aspiration PNA. 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 ? ?  ?PT Comments  ? ? Patient continues to require RW for gait as he gets internally distracted by talking and begins to drag his left foot more. When he focuses on lifting left foot for swing, he can clear it--however he needs cues to stay focused on the task. No loss of balance when using RW. Can continue to benefit from HHPT for home safety evaluation and progressing gait to using a cane vs no device (which is pt's long-term goal).  ?   ?Recommendations for follow up therapy are one component of a multi-disciplinary discharge planning process, led by the attending physician.  Recommendations may be updated based on patient status, additional functional criteria and insurance authorization. ? ?Follow Up Recommendations ? Home health PT ?  ?  ?Assistance Recommended at Discharge Frequent or constant Supervision/Assistance  ?Patient can return home with the following A little help with bathing/dressing/bathroom;Assistance with cooking/housework;Direct supervision/assist for medications management;Direct supervision/assist for financial management;Assist for transportation;Help with stairs or ramp for entrance;A little help with walking and/or transfers ?  ?Equipment Recommendations ? None recommended by PT  ?  ?Recommendations for Other Services   ? ? ?  ?Precautions / Restrictions Precautions ?Precautions: Fall ?Restrictions ?Weight Bearing Restrictions: No   ?  ? ?Mobility ? Bed Mobility ?Overal bed mobility: Needs Assistance ?Bed Mobility: Sit to Supine ?  ?  ?Supine to sit: Min guard ?Sit to supine: Min guard ?  ?General bed mobility comments: Pt seated in recliner on arrival.  Wanted to return to bed at end of session ?  ? ?Transfers ?Overall transfer level: Needs assistance ?Equipment used: Rolling walker (2 wheels) ?Transfers: Sit to/from Stand ?Sit to Stand: Min guard ?  ?  ?  ?  ?  ?General transfer comment: Pt unsuccessful initially.  He required cues to scoot forward to edge of seat and push with BUEs to rise into standing. With repetition he progressed to transferring with arms across his chest and just using his legs to get up ?  ? ?Ambulation/Gait ?Ambulation/Gait assistance: Min guard, Supervision ?Gait Distance (Feet): 300 Feet (seated rest; 15 x 2) ?Assistive device: Rolling walker (2 wheels) ?Gait Pattern/deviations: Narrow base of support, Trunk flexed, Shuffle, Decreased stride length, Decreased dorsiflexion - left ?Gait velocity: decreased ?  ?  ?General Gait Details: Cues for scapular retraction and upper trunk control.  Pt required cues for increasing BOS and for clearing left foot in swing phase (frequently needed cues) ? ? ?Stairs ?  ?Stairs assistance: Min guard ?Stair Management: Two rails, Forwards ?  ?General stair comments: Cues for sequencing and use of rails. ? ? ?Wheelchair Mobility ?  ? ?Modified Rankin (Stroke Patients Only) ?Modified Rankin (Stroke Patients Only) ?Pre-Morbid Rankin Score: Moderate disability ?Modified Rankin: Moderately severe disability ? ? ?  ?Balance Overall balance assessment: Needs assistance ?Sitting-balance support: Feet supported, No upper extremity supported ?Sitting balance-Leahy Scale: Fair ?Sitting balance - Comments: Able to lift feet off the ground and  minimally reliant on use of hands ?Postural control: Posterior lean ?Standing balance support: During functional activity, Bilateral upper extremity  supported ?Standing balance-Leahy Scale: Poor ?Standing balance comment: reaches for bil UE support when given option to use RW for static standing or not ?  ?  ?  ?  ?  ?  ?  ?  ?  ?  ?  ?  ? ?  ?Cognition Arousal/Alertness: Awake/alert ?Behavior During Therapy: [facility] for tasks assessed/performed ?Overall Cognitive Status: History of cognitive impairments - at baseline ?Area of Impairment: Attention, Safety/judgement, Awareness, Following commands ?  ?  ?  ?  ?  ?  ?  ?  ?  ?Current Attention Level: Selective ?  ?Following Commands: Follows one step commands consistently, Follows multi-step commands consistently ?Safety/Judgement: Decreased awareness of safety, Decreased awareness of deficits ?Awareness: Emergent ?Problem Solving: Requires verbal cues ?General Comments: Self distracts with lots of talking--often off topic. Requires repeated cues due to this ?  ?  ? ?  ?Exercises General Exercises - Lower Extremity ?Hip Flexion/Marching: AROM, Both, 10 reps, Standing (cues for slow, controlled movements; bil UE support on RW) ?Heel Raises: AROM, Both, 10 reps, Standing ?Mini-Sqauts: AROM, Both, 10 reps, Standing ?Other Exercises ?Other Exercises: sit to stand progressing from bil UE pushing off armrests to no UE use with arms crossed across chest--10 reps ? ?  ?General Comments General comments (skin integrity, edema, etc.): Wife present. ?  ?  ? ?Pertinent Vitals/Pain Pain Assessment ?Pain Assessment: No/denies pain  ? ? ?Home Living Family/patient expects to be discharged to:: Private residence ?Living Arrangements: Spouse/significant other ?Available Help at Discharge: Family;Available 24 hours/day ?Type of Home: House ?Home Access: Stairs to enter ?Entrance Stairs-Rails: Right ?Entrance Stairs-Number of Steps: 3 ?  ?Home Layout: One level ?Home Equipment: Cane - single point;Grab bars - toilet;Grab bars - tub/shower;Rolling Walker (2 wheels);Shower seat ?   ?  ?Prior Function    ?  ?  ?   ? ?PT Goals (current  goals can now be found in the care plan section) Acute Rehab PT Goals ?Patient Stated Goal: Transition to home and then to outpatient PT when ready ?PT Goal Formulation: With patient ?Time For Goal Achievement: 03/15/22 ?Potential to Achieve Goals: Good ?Progress towards PT goals: Progressing toward goals ? ?  ?Frequency ? ? ? Min 4X/week ? ? ? ?  ?PT Plan Current plan remains appropriate  ? ? ?Co-evaluation PT/OT/SLP Co-Evaluation/Treatment: Yes ?  ?  ?  ?  ? ?  ?AM-PAC PT "6 Clicks" Mobility   ?Outcome Measure ? Help needed turning from your back to your side while in a flat bed without using bedrails?: None ?Help needed moving from lying on your back to sitting on the side of a flat bed without using bedrails?: None ?Help needed moving to and from a bed to a chair (including a wheelchair)?: A Little ?Help needed standing up from a chair using your arms (e.g., wheelchair or bedside chair)?: A Little ?Help needed to walk in hospital room?: A Little ?Help needed climbing 3-5 steps with a railing? : A Little ?6 Click Score: 20 ? ?  ?End of Session Equipment Utilized During Treatment: Gait belt ?Activity Tolerance: Patient tolerated treatment well ?Patient left: in chair;with call bell/phone within reach;with chair alarm set ?Nurse Communication: Mobility status ?PT Visit Diagnosis: Unsteadiness on feet (R26.81);Other abnormalities of gait and mobility (R26.89);Muscle weakness (generalized) (M62.81);Difficulty in walking, not elsewhere classified (R26.2) ?  ? ? ?Time: 7510-2585 ?PT Time  Calculation (min) (ACUTE ONLY): 42 min ? ?Charges:  $Gait Training: 23-37 mins ?$Therapeutic Exercise: 8-22 mins          ?          ? ? ?Arby Barrette, PT ?Acute Rehabilitation Services  ?Pager [phone] ?Office [phone] ? ? ? ?Jeanie Cooks Mory Herrman ?03/04/2022, 11:43 AM ? ?

## 2022-03-04 NOTE — Telephone Encounter (Signed)
Noted. ?Has he been set up for home health PT? ?

## 2022-03-04 NOTE — Discharge Summary (Signed)
Physician Discharge Summary  [name] [name] CBJ:[id number] DOB: 19-Sep-1941 DOA: 02/28/2022  PCP: Pleas Koch, NP  Admit date: 02/28/2022 Discharge date: 03/04/2022  Admitted From: Inpatient Disposition: home  Recommendations for Outpatient Follow-up:  Follow up with PCP in 1-2 weeks  Home Health:Yes Equipment/Devices:no new equip, already arranged  Discharge Condition:Stable CODE STATUS:Full code Diet recommendation: Diabetic diet  Brief/Interim Summary: Per admitting physician: [name] [name] is a 81 y.o. male with PMH significant for thalamic stroke in 03/2021, HTN, HLD, T2DM, CAD s/p CABG 2005, carotid artery disease s/p bl CEA. Patient was brought to the ED from home with new weakness in the left side, slurred speech for more than 24 hours. Patient saw his PCP on Monday 2/27 for low-grade fever, coughing and was treated with Z-Pak.   In the ED, patient was afebrile, blood pressure 135/53, heart rate 66, breathing on room air.   CT head did not show an acute finding but showed multiple remote lacunar infarcts on background of parenchymal volume loss and chronic white matter microangiopathy.    Code stroke was called.  Seen by neurology. Admitted to hospitalist service  Hospital course: Acute CVA with right corona radiata infarct: Patient CT of head did not show any acute findings, MRI was positive for right coronary infarct and CTA head neck did not show any large vessel occlusion or stenosis.  His LDL was obtained was 55 his A1c was 6.1.  Patient was seen by neurology and consultation.  Recommend an echocardiogram which showed an EF of 55 to 60% with grade 1 diastolic dysfunction and no intra-atrial shunt.  It was noted prior to admission patient was on Plavix 75 mg daily.  Neurology recommended aspirin 81 mg x 3 months in conjunction with 75 mg.  At the conclusion of 3 months patient should stop the aspirin and continue his Plavix.  Patient was seen by PT over the weekend and  had significant improvement.  On Sunday patient was stable for discharge, wife was in the room and was in agreement.  I discussed the case with case management who indicated all equipment and home PT had already been arranged. While in the hospital patient was noted to have a mild upper respiratory infection chest x-ray was concerning for mild consolidation left lower lobe he was placed on appropriate antibiotics and will complete an additional 2 days on discharge of Omnicef.  On the day of discharge patient was afebrile normotensive with no indication of infection. Patient will continue his home medications for his chronic medical problems include hypertension, coronary artery disease, hyperlipidemia, and type 2 diabetes.  Of note his hemoglobin A1c was 6.1 showing excellent control.  Discharge Diagnoses:  Principal Problem:   Acute cerebrovascular accident (CVA) ([place]) Active Problems:   Essential hypertension   Coronary atherosclerosis   Type 2 diabetes mellitus ([place])   Carotid artery disease ([place])   Hyperlipidemia   Aspiration pneumonia vs. CAP   Stroke-like symptoms    Discharge Instructions  Discharge Instructions     Call MD for:  difficulty breathing, headache or visual disturbances   Complete by: As directed    Call MD for:  persistant dizziness or light-headedness   Complete by: As directed    Call MD for:  persistant nausea and vomiting   Complete by: As directed    Call MD for:  temperature >100.4   Complete by: As directed    Diet - low sodium heart healthy   Complete by: As directed  Diet Carb Modified   Complete by: As directed    Discharge instructions   Complete by: As directed    Return to ER for any acute change in medical condition to inlcude but not limited to changes in neurologic status   Increase activity slowly   Complete by: As directed       Allergies as of 03/04/2022       Reactions   Niacin Other (See Comments)   intol to NIACIN w/ headaches    Ativan [lorazepam] Other (See Comments)   agitation        Medication List     TAKE these medications    aspirin 81 MG chewable tablet Chew 1 tablet (81 mg total) by mouth daily. Start taking on: March 05, 2022   atorvastatin 20 MG tablet Commonly known as: LIPITOR Take 0.5 tablets (10 mg total) by mouth daily. For cholesterol.   azithromycin 250 MG tablet Commonly known as: ZITHROMAX Take 2 tablets on day 1, then 1 tablet daily on days 2 through 5   cefdinir 300 MG capsule Commonly known as: OMNICEF Take 1 capsule (300 mg total) by mouth every 12 (twelve) hours for 2 days.   cholecalciferol 25 MCG (1000 UNIT) tablet Commonly known as: VITAMIN D3 Take 1,000 Units by mouth daily.   clopidogrel 75 MG tablet Commonly known as: PLAVIX Take 1 tablet (75 mg total) by mouth daily.   hydrochlorothiazide 12.5 MG capsule Commonly known as: MICROZIDE Take 1 capsule (12.5 mg total) by mouth daily.   losartan 25 MG tablet Commonly known as: COZAAR Take 2 tablets (50 mg total) by mouth daily. For blood pressure.   nitroGLYCERIN 0.4 MG SL tablet Commonly known as: NITROSTAT Place 1 tablet (0.4 mg total) under the tongue every 5 (five) minutes as needed for chest pain.   vitamin B-12 1000 MCG tablet Commonly known as: CYANOCOBALAMIN Take 1,000 mcg by mouth daily.        Follow-up Information     [facility] Follow up.   Specialty: Home Health Services Why: The home health agency will contact you for the first home visit. Contact information: [address] [place] [address] [phone]                Allergies  Allergen Reactions   Niacin Other (See Comments)    intol to NIACIN w/ headaches    Ativan [Lorazepam] Other (See Comments)    agitation    Consultations: Neurology   Procedures/Studies: CT ANGIO HEAD NECK W WO CM  Result Date: 03/01/2022 CLINICAL DATA:  Infarct seen in the right corona radiata EXAM: CT  ANGIOGRAPHY HEAD AND NECK TECHNIQUE: Multidetector CT imaging of the head and neck was performed using the standard protocol during bolus administration of intravenous contrast. Multiplanar CT image reconstructions and MIPs were obtained to evaluate the vascular anatomy. Carotid stenosis measurements (when applicable) are obtained utilizing NASCET criteria, using the distal internal carotid diameter as the denominator. RADIATION DOSE REDUCTION: This exam was performed according to the departmental dose-optimization program which includes automated exposure control, adjustment of the mA and/or kV according to patient size and/or use of iterative reconstruction technique. CONTRAST:  69m OMNIPAQUE IOHEXOL 350 MG/ML SOLN COMPARISON:  CT head and brain MRI dated 1 day prior, CTA head neck 04/03/2021 FINDINGS: CTA NECK FINDINGS Aortic arch: There is mild calcified atherosclerotic plaque of the aortic arch. The origins of the major branch vessels are patent. The subclavian arteries are patent  to the level imaged. Right carotid system: The patient is status post right carotid endarterectomy. The right common, internal, and external carotid arteries are patent, without hemodynamically significant stenosis or occlusion. There is no dissection or aneurysm. There is mild plaque at the bifurcation. Left carotid system: The left common carotid artery is patent with scattered mild calcified plaque but no hemodynamically significant stenosis. There has been interval left carotid endarterectomy since 04/03/2021. There is no residual hemodynamically significant stenosis or occlusion. The left external carotid artery is patent. There is no dissection or aneurysm. Vertebral arteries: There is moderate stenosis at the origin of the right vertebral artery, similar to the prior study. Remainder of the right vertebral artery is widely patent. There is mild stenosis of the left vertebral artery origin. The left vertebral artery is  otherwise widely patent. There is no evidence of dissection or aneurysm. Skeleton: There is mild degenerative change at C5-C6. There is calcification of the nuchal ligament. There is no acute osseous abnormality or aggressive osseous lesion. There is thickening of the posterior longitudinal ligament particularly over the C2 vertebral body/dens resulting in at least moderate spinal canal stenosis, similar to the prior study. Other neck: The soft tissues are otherwise unremarkable. Surgical clips are noted in the left neck Upper chest: Median sternotomy wires and mediastinal surgical clips are noted. Imaged lung apices are clear. Review of the MIP images confirms the above findings CTA HEAD FINDINGS Anterior circulation: There is calcified atherosclerotic plaque of the bilateral intracranial ICAs resulting in up to mild-to-moderate stenosis on the left and no greater than mild stenosis on the right. The bilateral MCAs are patent. The bilateral ACAs are patent. The left A1 segment is hypoplastic or absent, likely developmental variant. There is no aneurysm or AVM. Posterior circulation: The bilateral V4 segments are patent. PICA is identified bilaterally. The basilar artery is patent. The bilateral PCAs are patent. There is a fetal origin of the left PCA, unchanged. A diminutive right posterior communicating artery is also seen. There is no aneurysm or AVM. Venous sinuses: As permitted by contrast timing, patent. Anatomic variants: As above. Review of the MIP images confirms the above findings IMPRESSION: 1. No emergent large vessel occlusion. 2. Interval left carotid endarterectomy since 04/03/2021 with no residual hemodynamically significant stenosis or occlusion. Patient is also status post endarterectomy on the right with no significant stenosis or occlusion. 3. Unchanged moderate right and mild left vertebral artery origin stenosis. The vertebral arteries are otherwise widely patent in the neck. 4. Calcified  atherosclerotic plaque in the intracranial ICAs resulting in up to mild-to-moderate stenosis on the left and no greater than mild stenosis on the right. Otherwise, patent intracranial vasculature. 5. Thickening of the posterior longitudinal ligament likely resulting in at least moderate spinal canal stenosis from C2 through C4. This could be further evaluated with MRI of the cervical spine as indicated. Electronically Signed   By: Valetta Mole M.D.   On: 03/01/2022 11:06   DG Chest 2 View  Result Date: 02/26/2022 CLINICAL DATA:  Productive cough EXAM: CHEST - 2 VIEW COMPARISON:  08/19/2018 FINDINGS: Cardiac size is within normal limits. There is previous coronary bypass surgery. There are linear densities in the right lower lung fields. There are no signs of pulmonary edema. There is no pleural effusion or pneumothorax. IMPRESSION: Small linear densities in the right lower lung fields may suggest scarring or subsegmental atelectasis. There are no signs of pulmonary edema or focal pulmonary consolidation. Electronically Signed   By: Royston Cowper  Rathinasamy M.D.   On: 02/26/2022 11:43   CT Head Wo Contrast  Result Date: 02/28/2022 CLINICAL DATA:  TIA, slurred speech EXAM: CT HEAD WITHOUT CONTRAST TECHNIQUE: Contiguous axial images were obtained from the base of the skull through the vertex without intravenous contrast. RADIATION DOSE REDUCTION: This exam was performed according to the departmental dose-optimization program which includes automated exposure control, adjustment of the mA and/or kV according to patient size and/or use of iterative reconstruction technique. COMPARISON:  Brain MRI 04/03/2021, CT head 04/03/2021 FINDINGS: Brain: There is no evidence of acute intracranial hemorrhage, extra-axial fluid collection, or acute infarct. There is mild global parenchymal volume loss with prominence of the ventricular system and extra-axial CSF spaces. The ventricles are stable in size compared to the prior CT.  There are remote lacunar infarcts in the bilateral basal ganglia, thalami, and corona radiata, and a remote cortical infarct in the high right frontal lobe in the precentral gyrus, unchanged (3-27). Otherwise, gray-white differentiation is preserved. There is no mass lesion.  There is no mass effect or midline shift. Vascular: There is calcification of the bilateral cavernous ICAs. Skull: Normal. Negative for fracture or focal lesion. Sinuses/Orbits: The imaged paranasal sinuses are clear. Bilateral lens implants are in place. The globes and orbits are otherwise unremarkable. Other: None. IMPRESSION: 1. No acute intracranial pathology. 2. Multiple remote lacunar infarcts on a background of parenchymal volume loss and chronic white matter microangiopathy as above. Electronically Signed   By: Valetta Mole M.D.   On: 02/28/2022 11:47   MR BRAIN WO CONTRAST  Result Date: 02/28/2022 CLINICAL DATA:  Acute neuro deficit. Slurred speech, left-sided weakness EXAM: MRI HEAD WITHOUT CONTRAST TECHNIQUE: Multiplanar, multiecho pulse sequences of the brain and surrounding structures were obtained without intravenous contrast. COMPARISON:  CT head 02/28/2022 FINDINGS: Brain: Motion degraded study Acute infarct in the right corona radiata.  No other acute infarct Generalized atrophy. Chronic microvascular ischemic changes in the white matter and basal ganglia bilaterally. Negative for hemorrhage, mass, or hydrocephalus. Vascular: Normal arterial flow voids at the skull base Skull and upper cervical spine: No focal lesion. Sinuses/Orbits: Mild mucosal edema paranasal sinuses. Bilateral cataract extraction Other: None IMPRESSION: Acute infarct right corona radiata. Atrophy and moderate chronic microvascular ischemic change Motion degraded study. Electronically Signed   By: Franchot Gallo M.D.   On: 02/28/2022 18:24   DG CHEST PORT 1 VIEW  Result Date: 02/28/2022 CLINICAL DATA:  Dysphagia. EXAM: PORTABLE CHEST 1 VIEW COMPARISON:   Chest radiograph dated 02/26/2022. FINDINGS: There is shallow inspiration. Cardiomegaly with vascular congestion. Left mid to lower lung field interstitial densities have progressed since the prior radiograph and may represent worsening edema or pneumonia. Right lung base atelectasis. No large pleural effusion. No pneumothorax. Stable cardiomediastinal silhouette. 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 No acute osseous pathology. IMPRESSION: 1. Cardiomegaly with vascular congestion. 2. Left mid to lower lung field interstitial densities have progressed since the prior radiograph and may represent worsening edema or pneumonia. Electronically Signed   By: Anner Crete M.D.   On: 02/28/2022 18:53   DG Swallowing Func-Speech Pathology  Result Date: 03/01/2022 Table formatting from the original result was not included. Objective Swallowing Evaluation: Type of Study: MBS-Modified Barium Swallow Study  Patient Details Name: [name] [name] MRN: [id number] Date of Birth: November 18, 1941 Today's Date: 03/01/2022 Time: SLP Start Time (ACUTE ONLY): 1505 -SLP Stop Time (ACUTE ONLY): 1520 SLP Time Calculation (min) (ACUTE ONLY): 15 min Past Medical History: Past Medical History: Diagnosis Date  Basal cell carcinoma 11/09/2019  nod & infil-behind right ear-cx3 &exc  Basal cell carcinoma 03/21/2020  Residual BCC with peripheral margin involved - ST recommends MOHs  Carotid artery occlusion   Coronary artery disease   s/p CABG 2005; sees Dr Johnsie Cancel yearly  Diabetes mellitus without complication ([place])   Gynecomastia   Hypercholesterolemia   Hypertension   Neurodermatitis   Overweight(278.02)   Personal history of colonic polyps 10/08/2006  tubular adenomas  Renal insufficiency   Stroke [facility])   TIA  April 2022  Transient ischemic attack 2010  "lasted ~ 5 seconds"  Vitamin D deficiency  Past Surgical History: Past Surgical History: Procedure Laterality Date  CARDIAC CATHETERIZATION  02/2004  "tried to stent; couldn't"  CAROTID  ENDARTERECTOMY Right 08/26/2015  COLONOSCOPY    CORONARY ANGIOPLASTY    CORONARY ARTERY BYPASS GRAFT  Feb. 2005  4 vessel  ENDARTERECTOMY Right 08/26/2015  Procedure: Right Carotid ENDARTERECTOMY with Patch Angioplasty ;  Surgeon: Rosetta Posner, MD;  Location: [facility] OR;  Service: Vascular;  Laterality: Right;  ENDARTERECTOMY Left 05/18/2021  Procedure: LEFT CAROTID ARTERY ENDARTERECTOMY  with patch angioplasty;  Surgeon: Rosetta Posner, MD;  Location: [facility] OR;  Service: Vascular;  Laterality: Left;  EYE SURGERY    bilateral cataract  KELOID EXCISION  04/2008  on chest scar; Dr. Dessie Coma  KELOID EXCISION    PILONIDAL CYST EXCISION  1989 HPI: [name] [name] is a 81 y.o. male with medical history significant of hx of thalamic stroke in 03/2021, HTN, HLD, T2DM, CAD s/p CABG 2005, carotid artery disease who presented to ED with left sided weakness/leg weakness and slurred speech.  Pt coughing and choking with eating that has been worse over the past 2-3 days. MRI Acute infarct right corona radiata.  No data recorded  Recommendations for follow up therapy are one component of a multi-disciplinary discharge planning process, led by the attending physician.  Recommendations may be updated based on patient status, additional functional criteria and insurance authorization. Assessment / Plan / Recommendation Clinical Impressions 03/01/2022 Clinical Impression Oral deficits were mild and marked by lingual residue and reduced coordination. Aspiration and mildly delayed swallow onset characterize the pharyngeal phase. Delayed initiation was short in duration and inconsistent with thin but contributed to penetration and aspiration (PAS 2,3,5,8). When he consumed small sips, tucked his chin this this prevented airway intrusion for majority of trials. On two occasions there was penetration to vocal cords and once slipped below the cords. Mr. Towry also coughed hard prior to aspiration trial when airway was free of penetrates/aspirates. It  is recommended he initiate a regular texture, thin liquids tucking his chin with liquids, pills whole in puree, no mixing consistencies orally and small sips. Therapist will follow. SLP Visit Diagnosis Dysphagia, oropharyngeal phase (R13.12) Attention and concentration deficit following -- Frontal lobe and executive function deficit following -- Impact on safety and function Mild aspiration risk;Moderate aspiration risk   Treatment Recommendations 03/01/2022 Treatment Recommendations Therapy as outlined in treatment plan below   Prognosis 03/01/2022 Prognosis for Safe Diet Advancement Good Barriers to Reach Goals -- Barriers/Prognosis Comment -- Diet Recommendations 03/01/2022 SLP Diet Recommendations Regular solids;Thin liquid Liquid Administration via Cup Medication Administration Whole meds with puree Compensations Minimize environmental distractions;Slow rate;Small sips/bites Postural Changes Seated upright at 90 degrees   Other Recommendations 03/01/2022 Recommended Consults -- Oral Care Recommendations Oral care BID Other Recommendations -- Follow Up Recommendations (No Data) Assistance recommended at discharge Intermittent Supervision/Assistance Functional Status Assessment Patient has had a recent  decline in their functional status and demonstrates the ability to make significant improvements in function in a reasonable and predictable amount of time. Frequency and Duration  03/01/2022 Speech Therapy Frequency (ACUTE ONLY) min 2x/week Treatment Duration 2 weeks   Oral Phase 03/01/2022 Oral Phase Impaired Oral - Pudding Teaspoon -- Oral - Pudding Cup -- Oral - Honey Teaspoon -- Oral - Honey Cup -- Oral - Nectar Teaspoon -- Oral - Nectar Cup -- Oral - Nectar Straw -- Oral - Thin Teaspoon -- Oral - Thin Cup Lingual/palatal residue Oral - Thin Straw -- Oral - Puree -- Oral - Mech Soft -- Oral - Regular WFL Oral - Multi-Consistency -- Oral - Pill -- Oral Phase - Comment --  Pharyngeal Phase 03/01/2022 Pharyngeal Phase Impaired  Pharyngeal- Pudding Teaspoon -- Pharyngeal -- Pharyngeal- Pudding Cup -- Pharyngeal -- Pharyngeal- Honey Teaspoon -- Pharyngeal -- Pharyngeal- Honey Cup -- Pharyngeal -- Pharyngeal- Nectar Teaspoon -- Pharyngeal -- Pharyngeal- Nectar Cup -- Pharyngeal -- Pharyngeal- Nectar Straw -- Pharyngeal -- Pharyngeal- Thin Teaspoon -- Pharyngeal -- Pharyngeal- Thin Cup Pharyngeal residue - valleculae;Penetration/Aspiration during swallow Pharyngeal Material enters airway, remains ABOVE vocal cords then ejected out;Material enters airway, remains ABOVE vocal cords and not ejected out;Material enters airway, CONTACTS cords and not ejected out;Material enters airway, passes BELOW cords without attempt by patient to eject out (silent aspiration) Pharyngeal- Thin Straw -- Pharyngeal -- Pharyngeal- Puree -- Pharyngeal -- Pharyngeal- Mechanical Soft -- Pharyngeal -- Pharyngeal- Regular WFL Pharyngeal -- Pharyngeal- Multi-consistency -- Pharyngeal -- Pharyngeal- Pill -- Pharyngeal -- Pharyngeal Comment --  Cervical Esophageal Phase  03/01/2022 Cervical Esophageal Phase WFL Pudding Teaspoon -- Pudding Cup -- Honey Teaspoon -- Honey Cup -- Nectar Teaspoon -- Nectar Cup -- Nectar Straw -- Thin Teaspoon -- Thin Cup -- Thin Straw -- Puree -- Mechanical Soft -- Regular -- Multi-consistency -- Pill -- Cervical Esophageal Comment -- [name] [name] 03/01/2022, 3:34 PM                     ECHOCARDIOGRAM COMPLETE  Result Date: 03/01/2022    ECHOCARDIOGRAM REPORT   Patient Name:   [name] [name] Date of Exam: 03/01/2022 Medical Rec #:  [id number]        Height:       73.0 in Accession #:    [id number]       Weight:       220.0 lb Date of Birth:  Jan 24, 1941       BSA:          2.241 m Patient Age:    22 years         BP:           128/66 mmHg Patient Gender: M                HR:           80 bpm. Exam Location:  Inpatient Procedure: 2D Echo Indications:    Stroke  History:        Patient has prior history of Echocardiogram examinations, most                  recent 04/03/2021. CAD, Stroke; Risk Factors:Hypertension and                 Diabetes.  Sonographer:    Arlyss Gandy Referring Phys: [id number] Orma Flaming  Sonographer Comments: Image acquisition challenging due to patient body habitus. IMPRESSIONS  1. Left ventricular ejection fraction, by estimation, is 55  to 60%. The left ventricle has normal function. The left ventricle has no regional wall motion abnormalities. Left ventricular diastolic parameters are consistent with Grade I diastolic dysfunction (impaired relaxation).  2. Right ventricular systolic function is normal. The right ventricular size is normal.  3. The mitral valve is grossly normal. Trivial mitral valve regurgitation.  4. The aortic valve is tricuspid. Aortic valve regurgitation is trivial. Aortic valve sclerosis is present, with no evidence of aortic valve stenosis.  5. The inferior vena cava is dilated in size with >50% respiratory variability, suggesting right atrial pressure of 8 mmHg. Comparison(s): Changes from prior study are noted. 04/03/2021: LVEF 60-65%. FINDINGS  Left Ventricle: Left ventricular ejection fraction, by estimation, is 55 to 60%. The left ventricle has normal function. The left ventricle has no regional wall motion abnormalities. The left ventricular internal cavity size was normal in size. There is  no left ventricular hypertrophy. Left ventricular diastolic parameters are consistent with Grade I diastolic dysfunction (impaired relaxation). Indeterminate filling pressures. Right Ventricle: The right ventricular size is normal. No increase in right ventricular wall thickness. Right ventricular systolic function is normal. Left Atrium: Left atrial size was normal in size. Right Atrium: Right atrial size was normal in size. Pericardium: There is no evidence of pericardial effusion. Mitral Valve: The mitral valve is grossly normal. Trivial mitral valve regurgitation. Tricuspid Valve: The tricuspid valve is grossly  normal. Tricuspid valve regurgitation is trivial. Aortic Valve: The aortic valve is tricuspid. Aortic valve regurgitation is trivial. Aortic valve sclerosis is present, with no evidence of aortic valve stenosis. Aortic valve mean gradient measures 3.0 mmHg. Aortic valve peak gradient measures 6.2 mmHg. Aortic valve area, by VTI measures 2.88 cm. Pulmonic Valve: The pulmonic valve was normal in structure. Pulmonic valve regurgitation is not visualized. Aorta: The aortic root and ascending aorta are structurally normal, with no evidence of dilitation. Venous: The inferior vena cava is dilated in size with greater than 50% respiratory variability, suggesting right atrial pressure of 8 mmHg. IAS/Shunts: No atrial level shunt detected by color flow Doppler.  LEFT VENTRICLE PLAX 2D LVIDd:         5.10 cm   Diastology LVIDs:         3.60 cm   LV e' medial:    5.11 cm/s LV PW:         0.70 cm   LV E/e' medial:  16.0 LV IVS:        1.00 cm   LV e' lateral:   7.94 cm/s LVOT diam:     2.10 cm   LV E/e' lateral: 10.3 LV SV:         80 LV SV Index:   36 LVOT Area:     3.46 cm  RIGHT VENTRICLE            IVC RV Basal diam:  3.90 cm    IVC diam: 2.20 cm RV Mid diam:    3.10 cm RV S prime:     8.38 cm/s TAPSE (M-mode): 1.2 cm LEFT ATRIUM             Index        RIGHT ATRIUM           Index LA diam:        4.40 cm 1.96 cm/m   RA Area:     16.60 cm LA Vol (A2C):   64.0 ml 28.56 ml/m  RA Volume:   41.10 ml  18.34 ml/m LA Vol (  A4C):   72.2 ml 32.22 ml/m LA Biplane Vol: 69.3 ml 30.93 ml/m  AORTIC VALVE AV Area (Vmax):    2.85 cm AV Area (Vmean):   2.58 cm AV Area (VTI):     2.88 cm AV Vmax:           124.00 cm/s AV Vmean:          85.100 cm/s AV VTI:            0.278 m AV Peak Grad:      6.2 mmHg AV Mean Grad:      3.0 mmHg LVOT Vmax:         102.00 cm/s LVOT Vmean:        63.400 cm/s LVOT VTI:          0.231 m LVOT/AV VTI ratio: 0.83  AORTA Ao Root diam: 2.90 cm MITRAL VALVE MV Area (PHT): 3.77 cm    SHUNTS MV Decel Time:  201 msec    Systemic VTI:  0.23 m MV E velocity: 81.80 cm/s  Systemic Diam: 2.10 cm MV A velocity: 72.80 cm/s MV E/A ratio:  1.12 Lyman Bishop MD Electronically signed by Lyman Bishop MD Signature Date/Time: 03/01/2022/12:13:10 PM    Final       Subjective: 1 day of discharge patient felt improved considerably was interested in being discharged home, wife and PT were in agreement  Discharge Exam: Vitals:   03/04/22 0820 03/04/22 1209  BP: (!) 164/71 (!) 160/66  Pulse: 72 (!) 59  Resp: 16 16  Temp: 98.2 F (36.8 C) (!) 97.5 F (36.4 C)  SpO2: 95% 92%   Vitals:   03/03/22 2323 03/04/22 0322 03/04/22 0820 03/04/22 1209  BP: (!) 145/61 119/65 (!) 164/71 (!) 160/66  Pulse: 73 63 72 (!) 59  Resp: '20 16 16 16  '$ Temp: 98.4 F (36.9 C) 98.4 F (36.9 C) 98.2 F (36.8 C) (!) 97.5 F (36.4 C)  TempSrc: Oral Oral Oral Oral  SpO2: 96% 94% 95% 92%  Weight:      Height:        General: Pt is alert, awake, not in acute distress Cardiovascular: RRR, S1/S2 +, no rubs, no gallops Respiratory: CTA bilaterally, no wheezing, no rhonchi Abdominal: Soft, NT, ND, bowel sounds + Extremities: no edema, no cyanosis    The results of significant diagnostics from this hospitalization (including imaging, microbiology, ancillary and laboratory) are listed below for reference.     Microbiology: Recent Results (from the past 240 hour(s))  Resp Panel by RT-PCR (Flu A&B, Covid) Nasopharyngeal Swab     Status: None   Collection Time: 02/28/22  3:58 PM   Specimen: Nasopharyngeal Swab; Nasopharyngeal(NP) swabs in vial transport medium  Result Value Ref Range Status   SARS Coronavirus 2 by RT PCR NEGATIVE NEGATIVE Final    Comment: (NOTE) SARS-CoV-2 target nucleic acids are NOT DETECTED.  The SARS-CoV-2 RNA is generally detectable in upper respiratory specimens during the acute phase of infection. The lowest concentration of SARS-CoV-2 viral copies this assay can detect is 138 copies/mL. A negative  result does not preclude SARS-Cov-2 infection and should not be used as the sole basis for treatment or other patient management decisions. A negative result may occur with  improper specimen collection/handling, submission of specimen other than nasopharyngeal swab, presence of viral mutation(s) within the areas targeted by this assay, and inadequate number of viral copies(<138 copies/mL). A negative result must be combined with clinical observations, patient history, and epidemiological information. The  expected result is Negative.  Fact Sheet for Patients:  EntrepreneurPulse.com.au  Fact Sheet for Healthcare Providers:  IncredibleEmployment.be  This test is no t yet approved or cleared by the [place] FDA and  has been authorized for detection and/or diagnosis of SARS-CoV-2 by FDA under an Emergency Use Authorization (EUA). This EUA will remain  in effect (meaning this test can be used) for the duration of the COVID-19 declaration under Section 564(b)(1) of the Act, 21 U.S.C.section 360bbb-3(b)(1), unless the authorization is terminated  or revoked sooner.       Influenza A by PCR NEGATIVE NEGATIVE Final   Influenza B by PCR NEGATIVE NEGATIVE Final    Comment: (NOTE) The Xpert Xpress SARS-CoV-2/FLU/RSV plus assay is intended as an aid in the diagnosis of influenza from Nasopharyngeal swab specimens and should not be used as a sole basis for treatment. Nasal washings and aspirates are unacceptable for Xpert Xpress SARS-CoV-2/FLU/RSV testing.  Fact Sheet for Patients: EntrepreneurPulse.com.au  Fact Sheet for Healthcare Providers: IncredibleEmployment.be  This test is not yet approved or cleared by the [place] FDA and has been authorized for detection and/or diagnosis of SARS-CoV-2 by FDA under an Emergency Use Authorization (EUA). This EUA will remain in effect (meaning this test can be used)  for the duration of the COVID-19 declaration under Section 564(b)(1) of the Act, 21 U.S.C. section 360bbb-3(b)(1), unless the authorization is terminated or revoked.  Performed at [facility], [place] [address], [place], [place] [address]   Respiratory (~20 pathogens) panel by PCR     Status: None   Collection Time: 02/28/22  3:58 PM   Specimen: Nasopharyngeal Swab; Respiratory  Result Value Ref Range Status   Adenovirus NOT DETECTED NOT DETECTED Final   Coronavirus 229E NOT DETECTED NOT DETECTED Final    Comment: (NOTE) The Coronavirus on the Respiratory Panel, DOES NOT test for the novel  Coronavirus (2019 nCoV)    Coronavirus HKU1 NOT DETECTED NOT DETECTED Final   Coronavirus NL63 NOT DETECTED NOT DETECTED Final   Coronavirus OC43 NOT DETECTED NOT DETECTED Final   Metapneumovirus NOT DETECTED NOT DETECTED Final   Rhinovirus / Enterovirus NOT DETECTED NOT DETECTED Final   Influenza A NOT DETECTED NOT DETECTED Final   Influenza B NOT DETECTED NOT DETECTED Final   Parainfluenza Virus 1 NOT DETECTED NOT DETECTED Final   Parainfluenza Virus 2 NOT DETECTED NOT DETECTED Final   Parainfluenza Virus 3 NOT DETECTED NOT DETECTED Final   Parainfluenza Virus 4 NOT DETECTED NOT DETECTED Final   Respiratory Syncytial Virus NOT DETECTED NOT DETECTED Final   Bordetella pertussis NOT DETECTED NOT DETECTED Final   Bordetella Parapertussis NOT DETECTED NOT DETECTED Final   Chlamydophila pneumoniae NOT DETECTED NOT DETECTED Final   Mycoplasma pneumoniae NOT DETECTED NOT DETECTED Final    Comment: Performed at [facility], [place]. [address], [name], [place] [address]  Culture, blood (routine x 2)     Status: None (Preliminary result)   Collection Time: 02/28/22  9:02 PM   Specimen: BLOOD LEFT HAND  Result Value Ref Range Status   Specimen Description BLOOD LEFT HAND  Final   Special Requests   Final    BOTTLES DRAWN AEROBIC AND ANAEROBIC Blood Culture adequate volume   Culture   Final     NO GROWTH 4 DAYS Performed at [facility], [place] [address], [place], [place] [address]    Report Status PENDING  Incomplete     Labs: BNP (last 3  results) No results for input(s): BNP in the last 8760 hours. Basic Metabolic Panel: Recent Labs  Lab 02/28/22 1100 03/02/22 0438  NA 138 137  K 3.8 3.4*  CL 102 102  CO2 28 29  GLUCOSE 120* 107*  BUN 15 15  CREATININE 1.14 1.10  CALCIUM 8.8* 8.3*   Liver Function Tests: Recent Labs  Lab 02/28/22 1100  AST 19  ALT 17  ALKPHOS 86  BILITOT 1.1  PROT 6.7  ALBUMIN 3.6   No results for input(s): LIPASE, AMYLASE in the last 168 hours. No results for input(s): AMMONIA in the last 168 hours. CBC: Recent Labs  Lab 02/28/22 1100 03/02/22 0438  WBC 6.5 6.7  NEUTROABS 4.5 4.3  HGB 15.1 13.8  HCT 43.4 40.1  MCV 100.9* 100.0  PLT 226 212   Cardiac Enzymes: No results for input(s): CKTOTAL, CKMB, CKMBINDEX, TROPONINI in the last 168 hours. BNP: Invalid input(s): POCBNP CBG: Recent Labs  Lab 03/03/22 0609 03/03/22 1824 03/03/22 2112 03/04/22 0644 03/04/22 1208  GLUCAP 98 144* 106* 104* 118*   D-Dimer No results for input(s): DDIMER in the last 72 hours. Hgb A1c No results for input(s): HGBA1C in the last 72 hours. Lipid Profile No results for input(s): CHOL, HDL, LDLCALC, TRIG, CHOLHDL, LDLDIRECT in the last 72 hours. Thyroid function studies No results for input(s): TSH, T4TOTAL, T3FREE, THYROIDAB in the last 72 hours.  Invalid input(s): FREET3 Anemia work up No results for input(s): VITAMINB12, FOLATE, FERRITIN, TIBC, IRON, RETICCTPCT in the last 72 hours. Urinalysis    Component Value Date/Time   COLORURINE STRAW (A) 02/28/2022 1256   APPEARANCEUR CLEAR 02/28/2022 1256   LABSPEC 1.005 02/28/2022 1256   PHURINE 5.0 02/28/2022 1256   GLUCOSEU NEGATIVE 02/28/2022 1256   GLUCOSEU 100 mg/dL (A) 01/31/2009 1003   HGBUR NEGATIVE 02/28/2022 1256   BILIRUBINUR NEGATIVE 02/28/2022 1256   KETONESUR  NEGATIVE 02/28/2022 1256   PROTEINUR NEGATIVE 02/28/2022 1256   UROBILINOGEN 0.2 08/18/2015 0905   NITRITE NEGATIVE 02/28/2022 1256   LEUKOCYTESUR NEGATIVE 02/28/2022 1256   Sepsis Labs Invalid input(s): PROCALCITONIN,  WBC,  LACTICIDVEN Microbiology Recent Results (from the past 240 hour(s))  Resp Panel by RT-PCR (Flu A&B, Covid) Nasopharyngeal Swab     Status: None   Collection Time: 02/28/22  3:58 PM   Specimen: Nasopharyngeal Swab; Nasopharyngeal(NP) swabs in vial transport medium  Result Value Ref Range Status   SARS Coronavirus 2 by RT PCR NEGATIVE NEGATIVE Final    Comment: (NOTE) SARS-CoV-2 target nucleic acids are NOT DETECTED.  The SARS-CoV-2 RNA is generally detectable in upper respiratory specimens during the acute phase of infection. The lowest concentration of SARS-CoV-2 viral copies this assay can detect is 138 copies/mL. A negative result does not preclude SARS-Cov-2 infection and should not be used as the sole basis for treatment or other patient management decisions. A negative result may occur with  improper specimen collection/handling, submission of specimen other than nasopharyngeal swab, presence of viral mutation(s) within the areas targeted by this assay, and inadequate number of viral copies(<138 copies/mL). A negative result must be combined with clinical observations, patient history, and epidemiological information. The expected result is Negative.  Fact Sheet for Patients:  EntrepreneurPulse.com.au  Fact Sheet for Healthcare Providers:  IncredibleEmployment.be  This test is no t yet approved or cleared by the [place] FDA and  has been authorized for detection and/or diagnosis of SARS-CoV-2 by FDA under an Emergency Use Authorization (EUA). This EUA will remain  in effect (meaning  this test can be used) for the duration of the COVID-19 declaration under Section 564(b)(1) of the Act, 21 U.S.C.section  360bbb-3(b)(1), unless the authorization is terminated  or revoked sooner.       Influenza A by PCR NEGATIVE NEGATIVE Final   Influenza B by PCR NEGATIVE NEGATIVE Final    Comment: (NOTE) The Xpert Xpress SARS-CoV-2/FLU/RSV plus assay is intended as an aid in the diagnosis of influenza from Nasopharyngeal swab specimens and should not be used as a sole basis for treatment. Nasal washings and aspirates are unacceptable for Xpert Xpress SARS-CoV-2/FLU/RSV testing.  Fact Sheet for Patients: EntrepreneurPulse.com.au  Fact Sheet for Healthcare Providers: IncredibleEmployment.be  This test is not yet approved or cleared by the [place] FDA and has been authorized for detection and/or diagnosis of SARS-CoV-2 by FDA under an Emergency Use Authorization (EUA). This EUA will remain in effect (meaning this test can be used) for the duration of the COVID-19 declaration under Section 564(b)(1) of the Act, 21 U.S.C. section 360bbb-3(b)(1), unless the authorization is terminated or revoked.  Performed at [facility], [place] [address] [place], [place] [address]   Respiratory (~20 pathogens) panel by PCR     Status: None   Collection Time: 02/28/22  3:58 PM   Specimen: Nasopharyngeal Swab; Respiratory  Result Value Ref Range Status   Adenovirus NOT DETECTED NOT DETECTED Final   Coronavirus 229E NOT DETECTED NOT DETECTED Final    Comment: (NOTE) The Coronavirus on the Respiratory Panel, DOES NOT test for the novel  Coronavirus (2019 nCoV)    Coronavirus HKU1 NOT DETECTED NOT DETECTED Final   Coronavirus NL63 NOT DETECTED NOT DETECTED Final   Coronavirus OC43 NOT DETECTED NOT DETECTED Final   Metapneumovirus NOT DETECTED NOT DETECTED Final   Rhinovirus / Enterovirus NOT DETECTED NOT DETECTED Final   Influenza A NOT DETECTED NOT DETECTED Final   Influenza B NOT DETECTED NOT DETECTED Final   Parainfluenza Virus 1 NOT DETECTED NOT DETECTED Final    Parainfluenza Virus 2 NOT DETECTED NOT DETECTED Final   Parainfluenza Virus 3 NOT DETECTED NOT DETECTED Final   Parainfluenza Virus 4 NOT DETECTED NOT DETECTED Final   Respiratory Syncytial Virus NOT DETECTED NOT DETECTED Final   Bordetella pertussis NOT DETECTED NOT DETECTED Final   Bordetella Parapertussis NOT DETECTED NOT DETECTED Final   Chlamydophila pneumoniae NOT DETECTED NOT DETECTED Final   Mycoplasma pneumoniae NOT DETECTED NOT DETECTED Final    Comment: Performed at [facility], [place]. [address], [place], [place] [address]  Culture, blood (routine x 2)     Status: None (Preliminary result)   Collection Time: 02/28/22  9:02 PM   Specimen: BLOOD LEFT HAND  Result Value Ref Range Status   Specimen Description BLOOD LEFT HAND  Final   Special Requests   Final    BOTTLES DRAWN AEROBIC AND ANAEROBIC Blood Culture adequate volume   Culture   Final    NO GROWTH 4 DAYS Performed at [facility], [place] [address] [place], [place] [address]    Report Status PENDING  Incomplete     Time coordinating discharge: Over 30 minutes  SIGNED:   Nicolette Bang, MD  [facility] 03/04/2022, 12:15 PM Pager   If 7PM-7AM, please contact night-coverage [email or web address] Password [email or web address]

## 2022-03-04 NOTE — TOC Transition Note (Signed)
Transition of Care (TOC) - CM/SW Discharge Note ? ? ?Patient Details  ?Name: [name] [name] ?MRN: [id number] ?Date of Birth: [birth year] ? ?Transition of Care (TOC) CM/SW Contact:  ?Carles Collet, RN ?Phone Number: ?03/04/2022, 12:06 PM ? ? ?Clinical Narrative:    ?Plan for DC to home w [facility] services.  ?Notified [facility] of DC. ?No other TOC needs identified.  ? ? ? ?  ?Barriers to Discharge: Continued Medical Work up ? ? ?Patient Goals and CMS Choice ?  ?CMS Medicare.gov Compare Post Acute Care list provided to:: Patient ?Choice offered to / list presented to : Patient, Spouse ? ?Discharge Placement ?  ?           ?  ?  ?  ?  ? ?Discharge Plan and Services ?  ?Discharge Planning Services: CM Consult ?Post Acute Care Choice: Home Health          ?  ?  ?  ?  ?  ?HH Arranged: PT, OT, Speech Therapy ?[facility] Agency: [facility] ?Date HH Agency Contacted: 03/02/22 ?  ?Representative spoke with at [facility]: Tommi Rumps ? ?Social Determinants of Health (SDOH) Interventions ?  ? ? ?Readmission Risk Interventions ?No flowsheet data found. ? ? ? ? ?

## 2022-03-05 ENCOUNTER — Encounter: Payer: Self-pay | Admitting: Gastroenterology

## 2022-03-05 ENCOUNTER — Telehealth: Payer: Self-pay

## 2022-03-05 LAB — CULTURE, BLOOD (ROUTINE X 2)
Culture: NO GROWTH
Special Requests: ADEQUATE

## 2022-03-05 NOTE — Telephone Encounter (Signed)
Transition Care Management Follow-up Telephone Call ?Date of discharge and from where: [facility] 03-04-22 Dx: acute CVA ?How have you been since you were released from the hospital? Doing fine  ?Any questions or concerns? No ? ?Items Reviewed: ?Did the pt receive and understand the discharge instructions provided? Yes  ?Medications obtained and verified? Yes  ?Other? No  ?Any new allergies since your discharge? No  ?Dietary orders reviewed? Yes ?Do you have support at home? Yes  ? ?Home Care and Equipment/Supplies: ?Were home health services ordered? yes ?If so, what is the name of the agency? [facility]  ?Has the agency set up a time to come to the patient's home? no ?Were any new equipment or medical supplies ordered?  No ?What is the name of the medical supply agency? na ?Were you able to get the supplies/equipment? no ?Do you have any questions related to the use of the equipment or supplies? No ? ?Functional Questionnaire: (I = Independent and D = Dependent) ?ADLs: I ? ?Bathing/Dressing- I ? ?Meal Prep- I ? ?Eating- I ? ?Maintaining continence- I ? ?Transferring/Ambulation- I ? ?Managing Meds- I ? ?Follow up appointments reviewed: ? ?PCP Hospital f/u appt confirmed? Yes  Scheduled to see Dr Carlis Abbott on 03-14-22 @ 840am. ?[facility] f/u appt confirmed? No . ?Are transportation arrangements needed? No  ?If their condition worsens, is the pt aware to call PCP or go to the Emergency Dept.? Yes ?Was the patient provided with contact information for the PCP's office or ED? Yes ?Was to pt encouraged to call back with questions or concerns? Yes  ?

## 2022-03-05 NOTE — Telephone Encounter (Signed)
Called patient they have already set up home health PT.  ?

## 2022-03-05 NOTE — Telephone Encounter (Signed)
Pt's wife called to set up a HFU for CVA with Anda Kraft this week. There were no openings. He did see Matt last week. Asking if he needs to be worked in with [facility] Kraft or another provider. Wife aware it will be tomorrow before they hear back. ?

## 2022-03-05 NOTE — Telephone Encounter (Signed)
Let's add him on for 03/08 at 2:40 pm. ?Thanks. ?

## 2022-03-06 ENCOUNTER — Ambulatory Visit: Payer: Medicare PPO

## 2022-03-06 NOTE — Telephone Encounter (Signed)
Called patient appointment moved.  ?

## 2022-03-07 ENCOUNTER — Other Ambulatory Visit: Payer: Self-pay

## 2022-03-07 ENCOUNTER — Encounter: Payer: Self-pay | Admitting: Primary Care

## 2022-03-07 ENCOUNTER — Ambulatory Visit: Payer: Medicare PPO | Admitting: Primary Care

## 2022-03-07 DIAGNOSIS — I6522 Occlusion and stenosis of left carotid artery: Secondary | ICD-10-CM | POA: Diagnosis not present

## 2022-03-07 DIAGNOSIS — E1165 Type 2 diabetes mellitus with hyperglycemia: Secondary | ICD-10-CM | POA: Diagnosis not present

## 2022-03-07 DIAGNOSIS — Z8673 Personal history of transient ischemic attack (TIA), and cerebral infarction without residual deficits: Secondary | ICD-10-CM

## 2022-03-07 DIAGNOSIS — I10 Essential (primary) hypertension: Secondary | ICD-10-CM

## 2022-03-07 DIAGNOSIS — J690 Pneumonitis due to inhalation of food and vomit: Secondary | ICD-10-CM

## 2022-03-07 NOTE — Progress Notes (Signed)
Subjective:    Patient ID: [name] [name], male    DOB: December 07, 1941, 81 y.o.   MRN: [id number]  HPI  [name] [name] is a very pleasant 81 y.o. male with a history of hypertension, CVA, carotid artery disease, type 2 diabetes, COVID-19 infection who presents today for TCM hospital follow-up. His wife joins [place] today.  He presented to [facility] ED on 02/28/2022 for symptoms of slurred speech, facial asymmetry, left-sided weakness that began 3 days prior.  CT head revealed older lacunar infarcts, no acute abnormalities.  Given his symptoms, coupled with his prior CVA history, he was admitted for further evaluation.  During his hospital stay he underwent MRI brain (acute infarct right corona radiata, moderate chronic microvascular ischemic disease), chest x-ray (left lower lung field densities suggestive of edema or pneumonia), echocardiogram (EF of 55 to 60%, no thrombus), CTA (calcified plaque in bilateral intracranial ICAs, right worse than left).    Evaluated by neurology who recommended continuation of clopidogrel 75 mg daily with addition of aspirin 81 mg x 3 months, then transitioning to clopidogrel 75 mg daily.  Treated with aspiration pneumonia versus community-acquired pneumonia with IV Rocephin, transitioning to [place] orally.  Hydrochlorothiazide was held temporarily to allow for permissive hypertension.  He was discharged home on 03/04/2022 with recommendations for home health PT, continuation of Omnicef antibiotics, resume home medications, addition of aspirin 81 mg x 3 months.  Since his discharge home he's feeling much better. He continues to notice weakness to the left lower extremity, numbness to the fingertips of his left hand which is residual from his prior stroke. His speech is slightly slurred and improved.   He has been contacted by PT who is coming by tomorrow. He will then participate in SLP and OT. His wife denies acute confusion. He has an appointment scheduled with his  vascular services later this month.   He has not resumed HCTZ 12.5 mg daily. He is compliant to losartan 50 mg. He is asking if he can resume driving. He has not driven since his hospitalization.   BP Readings from Last 3 Encounters:  03/07/22 (!) 144/68  03/04/22 (!) 160/66  02/26/22 138/66     Review of Systems  Respiratory:  Negative for shortness of breath.   Cardiovascular:  Negative for chest pain.  Neurological:  Positive for speech difficulty and weakness. Negative for dizziness and headaches.        Past Medical History:  Diagnosis Date   Basal cell carcinoma 11/09/2019   nod & infil-behind right ear-cx3 &exc   Basal cell carcinoma 03/21/2020   Residual BCC with peripheral margin involved - ST recommends MOHs   Carotid artery occlusion    Coronary artery disease    s/p CABG 2005; sees Dr Johnsie Cancel yearly   Diabetes mellitus without complication ([place])    Gynecomastia    Hypercholesterolemia    Hypertension    Neurodermatitis    Overweight(278.02)    Personal history of colonic polyps 10/08/2006   tubular adenomas   Renal insufficiency    Stroke [facility])    TIA  April 2022   Transient ischemic attack 2010   "lasted ~ 5 seconds"   Vitamin D deficiency     Social History   Socioeconomic History   Marital status: Married    Spouse name: [name]   Number of children: 1   Years of education: Not on file   Highest education level: Some college, no degree  Occupational History   Occupation: retired  Tobacco Use   Smoking status: Never   Smokeless tobacco: Current    Types: Snuff   Tobacco comments:    uses 2 dip/week  Vaping Use   Vaping Use: Never used  Substance and Sexual Activity   Alcohol use: No    Alcohol/week: 0.0 standard drinks   Drug use: No   Sexual activity: Yes  Other Topics Concern   Not on file  Social History Narrative   Retired.   Once worked for the [employer].   Married.   Enjoys reading, spending time with family.    Left handed   Drinks  caffeine   One story home   Social Determinants of Health   Financial Resource Strain: Low Risk    Difficulty of Paying Living Expenses: Not hard at all  Food Insecurity: No Food Insecurity   Worried About Charity fundraiser in the Last Year: Never true   Arboriculturist in the Last Year: Never true  Transportation Needs: No Transportation Needs   Lack of Transportation (Medical): No   Lack of Transportation (Non-Medical): No  Physical Activity: Insufficiently Active   Days of Exercise per Week: 1 day   Minutes of Exercise per Session: 20 min  Stress: No Stress Concern Present   Feeling of Stress : Not at all  Social Connections: Moderately Integrated   Frequency of Communication with Friends and Family: More than three times a week   Frequency of Social Gatherings with Friends and Family: More than three times a week   Attends Religious Services: More than 4 times per year   Active Member of Clubs or Organizations: No   Attends Archivist Meetings: Never   Marital Status: Married  Human resources officer Violence: Not on file    Past Surgical History:  Procedure Laterality Date   CARDIAC CATHETERIZATION  02/2004   "tried to stent; couldn't"   CAROTID ENDARTERECTOMY Right 08/26/2015   COLONOSCOPY     CORONARY ANGIOPLASTY     CORONARY ARTERY BYPASS GRAFT  Feb. 2005   4 vessel   ENDARTERECTOMY Right 08/26/2015   Procedure: Right Carotid ENDARTERECTOMY with Patch Angioplasty ;  Surgeon: Rosetta Posner, MD;  Location: [facility] OR;  Service: Vascular;  Laterality: Right;   ENDARTERECTOMY Left 05/18/2021   Procedure: LEFT CAROTID ARTERY ENDARTERECTOMY  with patch angioplasty;  Surgeon: Rosetta Posner, MD;  Location: [facility] OR;  Service: Vascular;  Laterality: Left;   EYE SURGERY     bilateral cataract   KELOID EXCISION  04/2008   on chest scar; Dr. Dessie Coma   KELOID EXCISION     PILONIDAL CYST EXCISION  1989    Family History  Problem Relation Age of Onset   Heart disease Mother         Before age 68   Diabetes Mother    Kidney disease Mother    Heart attack Mother 59   Lung cancer Father 35   Diabetes Brother    Heart disease Brother    Heart disease Brother    Arthritis Brother    Diabetes Sister    Fibromyalgia Sister    Lung cancer Paternal Uncle        questionable as to if it was lung ca   Healthy Daughter    Colon cancer Neg Hx    Stroke Neg Hx     Allergies  Allergen Reactions   Niacin Other (See Comments)    intol to NIACIN w/ headaches    Ativan [  Lorazepam] Other (See Comments)    agitation    Current Outpatient Medications on File Prior to Visit  Medication Sig Dispense Refill   aspirin 81 MG chewable tablet Chew 1 tablet (81 mg total) by mouth daily. 90 tablet 0   atorvastatin (LIPITOR) 20 MG tablet Take 0.5 tablets (10 mg total) by mouth daily. For cholesterol. 45 tablet 1   cholecalciferol (VITAMIN D3) 25 MCG (1000 UNIT) tablet Take 1,000 Units by mouth daily.     clopidogrel (PLAVIX) 75 MG tablet Take 1 tablet (75 mg total) by mouth daily. 90 tablet 3   losartan (COZAAR) 25 MG tablet Take 2 tablets (50 mg total) by mouth daily. For blood pressure. 180 tablet 1   nitroGLYCERIN (NITROSTAT) 0.4 MG SL tablet Place 1 tablet (0.4 mg total) under the tongue every 5 (five) minutes as needed for chest pain. 25 tablet 3   vitamin B-12 (CYANOCOBALAMIN) 1000 MCG tablet Take 1,000 mcg by mouth daily.     hydrochlorothiazide (MICROZIDE) 12.5 MG capsule Take 1 capsule (12.5 mg total) by mouth daily. (Patient not taking: Reported on 03/05/2022) 90 capsule 3   No current facility-administered medications on file prior to visit.    BP (!) 144/68    Pulse 62    Temp 97.7 F (36.5 C) (Oral)    Ht '6\' 1"'$  (1.854 m)    Wt 221 lb (100.2 kg)    SpO2 98%    BMI 29.16 kg/m  Objective:   Physical Exam Cardiovascular:     Rate and Rhythm: Normal rate and regular rhythm.  Pulmonary:     Effort: Pulmonary effort is normal.     Breath sounds: Normal breath sounds.   Musculoskeletal:     Cervical back: Neck supple.  Skin:    General: Skin is warm and dry.  Neurological:     Mental Status: He is alert and oriented to person, place, and time.     Comments: Slight slurred speech noted today.  Left lower extremity weakness.          Assessment & Plan:      This visit occurred during the SARS-CoV-2 public health emergency.  Safety protocols were in place, including screening questions prior to the visit, additional usage of staff PPE, and extensive cleaning of exam room while observing appropriate contact time as indicated for disinfecting solutions.

## 2022-03-07 NOTE — Assessment & Plan Note (Signed)
Reviewed CTA from recent hospital stay. ? ?Following with vascular surgery. ?Continue Plavix 75 mg daily, continue aspirin 81 mg daily x 3 months.  ?

## 2022-03-07 NOTE — Assessment & Plan Note (Signed)
Recent hospitalization for recurrent CVA. ?Hospital notes/labs/imaging ? ?He seems to be recovering. Encouraged use of walker/assistive device. He will be evaluated by PT/OT/SLP soon.  ? ?Follow up with neurology as scheduled.  ? ?Continue clopidogrel 75 mg daily, continue aspirin 81 mg x 3 months, discontinue in early June 2023. ?

## 2022-03-07 NOTE — Assessment & Plan Note (Signed)
Well controlled with recent A1C of 6.1.  ? ?Continue off medications. ? ?

## 2022-03-07 NOTE — Assessment & Plan Note (Signed)
Improved. ?Appears well today.  ? ?Repeat chest xray in 4 weeks to ensure resolve.  ? ?

## 2022-03-07 NOTE — Patient Instructions (Signed)
Start monitoring your blood pressure daily, around the same time of day. Ensure that you have rested for 30 minutes prior to checking your blood pressure. ? ?Resume your hydrochlorothiazide blood pressure medication if your blood pressure consistently runs at or above 130 on top and/or 90 on bottom. ? ?Continue losartan 50 mg daily for blood pressure. ? ?Come to our office in 4 weeks to repeat your chest x-ray.  I want to ensure that your pneumonia has resolved. ? ?Follow-up with your vascular doctor and your neurologist. ? ?It was a pleasure to see you today! ? ? ? ?

## 2022-03-07 NOTE — Assessment & Plan Note (Signed)
Above goal today. ? ?Recommended he check his blood pressure at home and resume hydrochlorothiazide 12.5 mg daily if his blood pressure consistently runs at or above 130/90. ? ?Continue losartan 50 mg daily ?

## 2022-03-08 ENCOUNTER — Ambulatory Visit: Payer: Medicare PPO

## 2022-03-13 ENCOUNTER — Telehealth: Payer: Self-pay | Admitting: Primary Care

## 2022-03-13 ENCOUNTER — Ambulatory Visit: Payer: Medicare PPO

## 2022-03-13 DIAGNOSIS — R1310 Dysphagia, unspecified: Secondary | ICD-10-CM

## 2022-03-13 NOTE — Telephone Encounter (Signed)
Spoke to [place]  ?Needs Modified barium swallow repeated ASAP due to decreased swallowing and increased aspiration. Would like urgent order sent to [facility] acute rehab department.  ? ?She also request call once order placed and patient scheduled. Her contact number ins [phone] ?

## 2022-03-13 NOTE — Telephone Encounter (Signed)
Called she is the one that will intemperate  and make recommendations. She wants to make sure that in the referral we add that patient has had decreased swallowing and functions from last evaluation.  ? ?Test is the same as ordered on 03/01/2022 ?

## 2022-03-13 NOTE — Telephone Encounter (Signed)
Noted, order placed. 

## 2022-03-13 NOTE — Telephone Encounter (Signed)
[place]--Speech therapist with [facility] ? ?Fax orders over to [facility] to the Acute Rehab department  asap ? ?Call [place] [facility] [phone] office number), ask them for fax number for Modified ? ?Patient is Coughing repeatedly with intake of all thin liquids, putting him on nectar ? ?Modified barium swallow study, came out he was okay, but need the test run again ? ?Patient is constantly aspirating ? ? ?

## 2022-03-13 NOTE — Telephone Encounter (Signed)
Please notify [name] [name] that I am happy to order the modified barium swallow, but I have no experience in interpreting these reports and providing recommendations. ? ?If she can interpret and provide recommendations then I will order. ? ?Also, what is the correct order? ?I see something that she ordered from 03/01/22, is this the correct order? ?

## 2022-03-14 ENCOUNTER — Telehealth: Payer: Self-pay | Admitting: Primary Care

## 2022-03-14 ENCOUNTER — Ambulatory Visit: Payer: Medicare PPO | Admitting: Primary Care

## 2022-03-14 NOTE — Telephone Encounter (Signed)
The patient mentioned nothing of this during his recent visit. ?I recommend reevaluation as this could be something in addition to depression. ? ?Please notify his wife that the speech therapist had me put an order in for a repeat swallow test, he should be contacted by them shortly. ?

## 2022-03-14 NOTE — Telephone Encounter (Signed)
Pt wife called stating that pt is very depressed. Pt wife states he sleeps all the time pt communication is not normal he's always down. Please advise. ?

## 2022-03-14 NOTE — Telephone Encounter (Signed)
Did you want to bing this pt in for an appt or arrange an referral for him.Please advise. ?

## 2022-03-15 ENCOUNTER — Ambulatory Visit: Payer: Medicare PPO

## 2022-03-15 ENCOUNTER — Other Ambulatory Visit: Payer: Self-pay | Admitting: Primary Care

## 2022-03-15 NOTE — Telephone Encounter (Signed)
Called wife set up follow up in office for next week. If any changes or increased symptoms she will call office. She feels like he is better today. She has not heard about the study but if they do not call this week she will let us know.  ?

## 2022-03-20 ENCOUNTER — Other Ambulatory Visit: Payer: Self-pay | Admitting: Primary Care

## 2022-03-20 ENCOUNTER — Ambulatory Visit: Payer: Medicare PPO | Admitting: Physical Therapy

## 2022-03-20 DIAGNOSIS — E1165 Type 2 diabetes mellitus with hyperglycemia: Secondary | ICD-10-CM

## 2022-03-21 ENCOUNTER — Ambulatory Visit: Payer: Medicare PPO | Admitting: Vascular Surgery

## 2022-03-22 ENCOUNTER — Ambulatory Visit: Payer: Medicare PPO

## 2022-03-22 ENCOUNTER — Other Ambulatory Visit: Payer: Self-pay

## 2022-03-22 ENCOUNTER — Ambulatory Visit: Payer: Medicare PPO | Admitting: Primary Care

## 2022-03-22 DIAGNOSIS — F329 Major depressive disorder, single episode, unspecified: Secondary | ICD-10-CM | POA: Diagnosis not present

## 2022-03-22 DIAGNOSIS — F32A Depression, unspecified: Secondary | ICD-10-CM | POA: Insufficient documentation

## 2022-03-22 DIAGNOSIS — Z8673 Personal history of transient ischemic attack (TIA), and cerebral infarction without residual deficits: Secondary | ICD-10-CM | POA: Diagnosis not present

## 2022-03-22 NOTE — Assessment & Plan Note (Signed)
Exam stable today, no obvious new stroke. ? ?Continue outpatient OT, PT, SLP. ?Follow up with neurology as scheduled.  ?

## 2022-03-22 NOTE — Patient Instructions (Signed)
Please update me if your symptoms progress. ? ?It was a pleasure to see you today! ? ? ?

## 2022-03-22 NOTE — Assessment & Plan Note (Addendum)
Do suspect some of his symptoms are secondary to depression, especially given his recent medical history. ? ?Long discussion today regarding his current feelings/symptoms, he continues to endorse feeling well overall. Is wife agrees that he is improved. He declines therapy or other treatment.  ? ?We will closely monitor. His wife will update if anything changes.  ?

## 2022-03-22 NOTE — Progress Notes (Signed)
? ?Subjective:  ? ? Patient ID: [name] [name], male    DOB: 1941/10/10, 81 y.o.   MRN: [id number] ? ?HPI ? ?[name] [name] is a very pleasant 81 y.o. male with a history of hypertension, CAD, carotid artery stenosis, TIA, type 2 diabetes, anxiety, hyperlipidemia who presents today to discuss depression. His wife joins [place] today who is helping to provide information.  ? ?His wife endorses a four to five day history of sleeping all day, not getting out of bed, little communication with his family. His wife mentions "it was like he was in a daze". One week later (last week) his mood improved, he's more alert, more conversational. She has noticed symptoms will wax and wane, but are not as bad as it was two weeks ago. She's noticed memory changes, forgetfulness, repetitive questions, having to give instructions to him frequently, this began after his most recent stroke.   ? ?He denies feeling down/depressed. He doesn't recall sleeping for most of the day for 4-5 days straight. He does endorse feeling depressed on occasion over the years, was never on treatment for depression. He doesn't feel that he needs treatment for depression.  ? ?He denies anxiety, changes in speech, increased weakness, falls.  ? ? ? ?  03/22/2022  ?  3:42 PM 08/08/2021  ? 11:00 AM 06/13/2021  ?  9:06 AM  ?PHQ9 SCORE ONLY  ?PHQ-9 Total Score 6 2 0  ? ? ? ?BP Readings from Last 3 Encounters:  ?03/22/22 136/60  ?03/07/22 (!) 144/68  ?03/04/22 (!) 160/66  ? ? ? ? ?Review of Systems  ?Respiratory:  Negative for shortness of breath.   ?Cardiovascular:  Negative for chest pain.  ?Neurological:  Positive for numbness.  ?Psychiatric/Behavioral:  The patient is not nervous/anxious.   ?     See HPI  ? ?   ? ? ?Past Medical History:  ?Diagnosis Date  ? Basal cell carcinoma 11/09/2019  ? nod & infil-behind right ear-cx3 &exc  ? Basal cell carcinoma 03/21/2020  ? Residual BCC with peripheral margin involved - ST recommends MOHs  ? Carotid artery occlusion   ?  Coronary artery disease   ? s/p CABG 2005; sees Dr Johnsie Cancel yearly  ? Diabetes mellitus without complication ([place])   ? Gynecomastia   ? Hypercholesterolemia   ? Hypertension   ? Neurodermatitis   ? Overweight(278.02)   ? Personal history of colonic polyps 10/08/2006  ? tubular adenomas  ? Renal insufficiency   ? Stroke [facility])   ? TIA  April 2022  ? Transient ischemic attack 2010  ? "lasted ~ 5 seconds"  ? Vitamin D deficiency   ? ? ?Social History  ? ?Socioeconomic History  ? Marital status: Married  ?  Spouse name: [name] [name]  ? Number of children: 1  ? Years of education: Not on file  ? Highest education level: Some college, no degree  ?Occupational History  ? Occupation: retired  ?Tobacco Use  ? Smoking status: Never  ? Smokeless tobacco: Current  ?  Types: Snuff  ? Tobacco comments:  ?  uses 2 dip/week  ?Vaping Use  ? Vaping Use: Never used  ?Substance and Sexual Activity  ? Alcohol use: No  ?  Alcohol/week: 0.0 standard drinks  ? Drug use: No  ? Sexual activity: Yes  ?Other Topics Concern  ? Not on file  ?Social History Narrative  ? Retired.  ? Once worked for the [employer].  ? Married.  ? Enjoys reading, spending  time with family.   ? Left handed  ? Drinks caffeine  ? One story home  ? ?Social Determinants of Health  ? ?Financial Resource Strain: Low Risk   ? Difficulty of Paying Living Expenses: Not hard at all  ?Food Insecurity: No Food Insecurity  ? Worried About Charity fundraiser in the Last Year: Never true  ? Ran Out of Food in the Last Year: Never true  ?Transportation Needs: No Transportation Needs  ? Lack of Transportation (Medical): No  ? Lack of Transportation (Non-Medical): No  ?Physical Activity: Insufficiently Active  ? Days of Exercise per Week: 1 day  ? Minutes of Exercise per Session: 20 min  ?Stress: No Stress Concern Present  ? Feeling of Stress : Not at all  ?Social Connections: Moderately Integrated  ? Frequency of Communication with Friends and Family: More than three times a week  ? Frequency of  Social Gatherings with Friends and Family: More than three times a week  ? Attends Religious Services: More than 4 times per year  ? Active Member of Clubs or Organizations: No  ? Attends Archivist Meetings: Never  ? Marital Status: Married  ?Intimate Partner Violence: Not on file  ? ? ?Past Surgical History:  ?Procedure Laterality Date  ? CARDIAC CATHETERIZATION  02/2004  ? "tried to stent; couldn't"  ? CAROTID ENDARTERECTOMY Right 08/26/2015  ? COLONOSCOPY    ? CORONARY ANGIOPLASTY    ? CORONARY ARTERY BYPASS GRAFT  Feb. 2005  ? 4 vessel  ? ENDARTERECTOMY Right 08/26/2015  ? Procedure: Right Carotid ENDARTERECTOMY with Patch Angioplasty ;  Surgeon: Rosetta Posner, MD;  Location: [place];  Service: Vascular;  Laterality: Right;  ? ENDARTERECTOMY Left 05/18/2021  ? Procedure: LEFT CAROTID ARTERY ENDARTERECTOMY  with patch angioplasty;  Surgeon: Rosetta Posner, MD;  Location: [place];  Service: Vascular;  Laterality: Left;  ? EYE SURGERY    ? bilateral cataract  ? KELOID EXCISION  04/2008  ? on chest scar; Dr. Dessie Coma  ? KELOID EXCISION    ? PILONIDAL CYST EXCISION  1989  ? ? ?Family History  ?Problem Relation Age of Onset  ? Heart disease Mother   ?     Before age 66  ? Diabetes Mother   ? Kidney disease Mother   ? Heart attack Mother 8  ? Lung cancer Father 58  ? Diabetes Brother   ? Heart disease Brother   ? Heart disease Brother   ? Arthritis Brother   ? Diabetes Sister   ? Fibromyalgia Sister   ? Lung cancer Paternal Uncle   ?     questionable as to if it was lung ca  ? Healthy Daughter   ? Colon cancer Neg Hx   ? Stroke Neg Hx   ? ? ?Allergies  ?Allergen Reactions  ? Niacin Other (See Comments)  ?  intol to NIACIN w/ headaches ?  ? Ativan [Lorazepam] Other (See Comments)  ?  agitation  ? ? ?Current Outpatient Medications on File Prior to Visit  ?Medication Sig Dispense Refill  ? aspirin 81 MG chewable tablet Chew 1 tablet (81 mg total) by mouth daily. 90 tablet 0  ? atorvastatin (LIPITOR) 20 MG tablet Take  0.5 tablets (10 mg total) by mouth daily. For cholesterol. 45 tablet 1  ? cholecalciferol (VITAMIN D3) 25 MCG (1000 UNIT) tablet Take 1,000 Units by mouth daily.    ? clopidogrel (PLAVIX) 75 MG tablet Take 1 tablet (75 mg  total) by mouth daily. 90 tablet 3  ? glucose blood (ACCU-CHEK GUIDE) test strip USE AS DIRECTED TO TEST BLOOD SUGAR UP TO 4 TIMES DAILY 100 strip 3  ? losartan (COZAAR) 25 MG tablet Take 2 tablets (50 mg total) by mouth daily. For blood pressure. 180 tablet 1  ? nitroGLYCERIN (NITROSTAT) 0.4 MG SL tablet Place 1 tablet (0.4 mg total) under the tongue every 5 (five) minutes as needed for chest pain. 25 tablet 3  ? vitamin B-12 (CYANOCOBALAMIN) 1000 MCG tablet Take 1,000 mcg by mouth daily.    ? hydrochlorothiazide (MICROZIDE) 12.5 MG capsule Take 1 capsule (12.5 mg total) by mouth daily. (Patient not taking: Reported on 03/05/2022) 90 capsule 3  ? ?No current facility-administered medications on file prior to visit.  ? ? ?BP 136/60   Pulse 78   Temp 97.7 ?F (36.5 ?C) (Oral)   Ht '6\' 1"'$  (1.854 m)   Wt 216 lb (98 kg)   SpO2 99%   BMI 28.50 kg/m?  ?Objective:  ? Physical Exam ?Cardiovascular:  ?   Rate and Rhythm: Normal rate and regular rhythm.  ?Pulmonary:  ?   Effort: Pulmonary effort is normal.  ?   Breath sounds: Normal breath sounds. No wheezing or rales.  ?Musculoskeletal:  ?   Cervical back: Neck supple.  ?   Comments: Ambulates with walker.   ?Skin: ?   General: Skin is warm and dry.  ?Neurological:  ?   Mental Status: He is alert and oriented to person, place, and time.  ?   Comments: Participates during conversation appropriately   ? ? ? ? ? ?   ?Assessment & Plan:  ? ? ? ? ?This visit occurred during the SARS-CoV-2 public health emergency.  Safety protocols were in place, including screening questions prior to the visit, additional usage of staff PPE, and extensive cleaning of exam room while observing appropriate contact time as indicated for disinfecting solutions.  ?

## 2022-03-23 ENCOUNTER — Ambulatory Visit (HOSPITAL_COMMUNITY)
Admission: RE | Admit: 2022-03-23 | Discharge: 2022-03-23 | Disposition: A | Discharge status: Home / Self Care | Payer: Medicare PPO | Source: Ambulatory Visit | Attending: Internal Medicine | Admitting: Internal Medicine

## 2022-03-23 DIAGNOSIS — Z8673 Personal history of transient ischemic attack (TIA), and cerebral infarction without residual deficits: Secondary | ICD-10-CM | POA: Insufficient documentation

## 2022-03-23 DIAGNOSIS — R1310 Dysphagia, unspecified: Secondary | ICD-10-CM | POA: Diagnosis not present

## 2022-03-23 DIAGNOSIS — R1312 Dysphagia, oropharyngeal phase: Secondary | ICD-10-CM | POA: Insufficient documentation

## 2022-03-23 DIAGNOSIS — J690 Pneumonitis due to inhalation of food and vomit: Secondary | ICD-10-CM | POA: Diagnosis not present

## 2022-03-23 NOTE — Progress Notes (Signed)
Modified Barium Swallow Progress Note ? ?Patient Details  ?Name: [name] [name] ?MRN: [id number] ?Date of Birth: [birth year] ? ?Today's Date: 03/23/2022 ? ?Modified Barium Swallow completed.  Full report located under Chart Review in the Imaging Section. ? ?Brief recommendations include the following: ? ?Clinical Impression ? Pt presents with oropharyngeal dysphagia characterized by reduced posterior bolus propulsion, reduced bolus cohesion, and a pharyngeal delay. He demonstrated premature spillage to the valleculae and pyriform sinuses, and lingual pumping when attempting to swallow the 77m barium tablet with thin liquids. Penetration (PAS 3, 5) was noted with thin and nectar thick liquids via cup and straw and aspiration (PAS 7, 8) was observed with consecutive swallows of thin liquids, with thin liquids via cup when the pill was taken and with consecutive swallows of nectar thick liquids. Instances of aspiration inconsistently triggered delayed coughing and this was noted most frequently with larger amounts of aspiration. Coughing was ineffective in expelling the aspirate, but cued coughing did expel penetrated material. With thin and nectar thick liquids, penetration and aspiration were eliminated with a chin tuck posture with left head turn and use of individual swallows. Laryngeal invasion was not observed with individual swallows of thin liquids when pt was cued to swallow on the count of three. Laryngeal invasion was also improved to a functional level of (PAS 2) with smaller boluses of thin liquids. No instances of penetration/aspiration were noted with honey thick liquids. Pt's performance was discussed with the pt's wife, the pt, and the pt's home health SLP, [facility] Based on these conversations, it appears that the pt has been unable to consistently utilize compensatory strategies despite consistent verbal prompting from his wife/SLP during meals. It is recommended that regular texture solids be  contiued. Based on his performance and difficulty with observance of swallowing precautions, the following options could be considered for liquids: 1. Use of thin liquids via [place] cup/controlled flow straw to reduce bolus size, 2. Use of honey thick liquids via cup/straw, 3. Use of thin liquids via cup with the goal of education to factors to reduce risk of dysphagia-related adverse events. It was agreed that the final decision regarding liquids would be deferred to the home health SLP with consideration of pt and wife's goals, and pt's performance within his home environment. Continued home health SLP services are recommended at this time for dysphagia management and intervention with consideration of respiratory muscle training (RMT) to improve cough effectiveness. All parties verbalized understanding and agreement regarding plan of care. ?  ?Swallow Evaluation Recommendations ? ?   ? ? SLP Diet Recommendations: Regular solids (Thin vs honey thick liquids) ? ? Liquid Administration via: Cup;No straw ? ? Medication Administration: Whole meds with puree ? ? Supervision: Patient able to self feed ? ? Compensations: Minimize environmental distractions;Slow rate;Small sips/bites ? ? Postural Changes: Seated upright at 90 degrees ? ? Oral Care Recommendations: Oral care BID ? ?   ? ?[name] [name] I. [name] [name] [name] [name] CCC-SLP ?[facility] ?Office number [phone]?Pager [phone]? ? ?[name] [name]?03/23/2022,2:47 PM ? ? ? ?

## 2022-03-27 ENCOUNTER — Ambulatory Visit: Payer: Medicare PPO | Admitting: Physical Therapy

## 2022-03-27 NOTE — Progress Notes (Signed)
? ?NEUROLOGY FOLLOW UP OFFICE NOTE ? ?[name] [name] ?[id number] ? ?Assessment/Plan:  ? ?Right corona radiata stroke ?Asymptomatic left internal carotid artery stenosis status post carotid endarterectomy ?Status post right carotid endarterectomy ?Type 2 diabetes mellitus ?Hypertension ?Hyperlipidemia ?Tobacco use disorder ?Coronary artery disease ?  ?1  Secondary stroke prevention as managed by PCP: ?            - Plavix '75mg'$  daily and ASA '81mg'$  daily for 2 more months, then Plavix '75mg'$  daily alone ?            - Statin.  LDL goal less than 70 ?            - Hgb A1c goal less than 7 ?            - Normotensive blood pressure ?2  Continue PT/OT/Speech. ?3  No driving at this time. ?4  If depressed mood is a problem, consider antidepressant. ?5.  Follow up 5 months ? ?Subjective:  ?[name] [name] is a 81 year old left-handed male with HTN, HLD, DMII, CAD, carotid artery disease s/p R CEA and history of TIA who follows up for stroke.  He is accompanied by his wife who supplements history (daughter via phone).  CT and MRI of recent hospitalization personally reviewed. ?  ?UPDATE: ?Current medications: Plavix '75mg'$  daily, atorvastatin '10mg'$  daily, HCTZ, losartan ?  ?Patient was admitted to the hospital on 02/28/2022 for 2-3 days of left leg weakness, left facial weakness and slurred speech.   MRI of brain showed acute infarct within the right corona radiata.  CTA head and neck showed calcified atherosclerotic plaque in the intercranial ICAs causing mild-moderate stenosis on left and mild on right, left and right carotid endarterectomy with no residual significant stenosis or occlusion, and unchanged moderate right and mild left vertebral artery stenosis.  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  2D echo showed LVEF 55-60% with Grade I diastolic dysfunction, trival MR, aortic valve sclerosis with no  evidence of stenosis with no thrombus or atrial level shunt.  LDL 55 and Hgb A1c 6.1.  He was discharged on ASA and Plavix for 3 months followed by Plavix alone.  Needed PT/OT/Speech.  Since this stroke, his wife notes that some working memory has been worse.  Trouble figuring out which filing cabinet to open.  Trouble with swallowing.  Currently on honey thickened liquids.  May have depressed mood but patient doesn't think so. ?  ?  ?HISTORY: ?He was admitted to [facility] from 04/03/2021 to 04/06/2021 for transient left-sided numbness and weakness lasting an hour.  CT head was negative for follow up MRI of brain showed acute small right thalamic infarct with chronic small vessel ischemic changes and multiple chronic lacunar infarcts.  CTA of head and neck showed 70-80% left ICA stenosis.  2D echo showed EF 60-65% with no cardiac source of embolus.  LDL was 86 and Hgb A1c 6.7.  Prior to admission, hne was on ASA '81mg'$  daily.  He was discharged on ASA '81mg'$  and Plavix '75mg'$  daily for 3 weeks followed by Plavix alone.  Lipitor was increased from 10 to '40mg'$  daily. Underwent carotid endarterectomy on 05/18/2021 for asymptomatic left ICA stenosis. ? ?PAST MEDICAL HISTORY: ?Past Medical History:  ?Diagnosis Date  ? Basal cell carcinoma 11/09/2019  ? nod & infil-behind right ear-cx3 &exc  ? Basal cell carcinoma 03/21/2020  ?  Residual BCC with peripheral margin involved - ST recommends MOHs  ? Carotid artery occlusion   ? Coronary artery disease   ? s/p CABG 2005; sees Dr Johnsie Cancel yearly  ? Diabetes mellitus without complication ([place])   ? Gynecomastia   ? Hypercholesterolemia   ? Hypertension   ? Neurodermatitis   ? Overweight(278.02)   ? Personal history of colonic polyps 10/08/2006  ? tubular adenomas  ? Renal insufficiency   ? Stroke [facility])   ? TIA  April 2022  ? Transient ischemic attack 2010  ? "lasted ~ 5 seconds"  ? Vitamin D deficiency   ? ? ?MEDICATIONS: ?Current Outpatient Medications on File Prior to Visit   ?Medication Sig Dispense Refill  ? aspirin 81 MG chewable tablet Chew 1 tablet (81 mg total) by mouth daily. 90 tablet 0  ? atorvastatin (LIPITOR) 20 MG tablet Take 0.5 tablets (10 mg total) by mouth daily. For cholesterol. 45 tablet 1  ? cholecalciferol (VITAMIN D3) 25 MCG (1000 UNIT) tablet Take 1,000 Units by mouth daily.    ? clopidogrel (PLAVIX) 75 MG tablet Take 1 tablet (75 mg total) by mouth daily. 90 tablet 3  ? glucose blood (ACCU-CHEK GUIDE) test strip USE AS DIRECTED TO TEST BLOOD SUGAR UP TO 4 TIMES DAILY 100 strip 3  ? hydrochlorothiazide (MICROZIDE) 12.5 MG capsule Take 1 capsule (12.5 mg total) by mouth daily. (Patient not taking: Reported on 03/05/2022) 90 capsule 3  ? losartan (COZAAR) 25 MG tablet Take 2 tablets (50 mg total) by mouth daily. For blood pressure. 180 tablet 1  ? nitroGLYCERIN (NITROSTAT) 0.4 MG SL tablet Place 1 tablet (0.4 mg total) under the tongue every 5 (five) minutes as needed for chest pain. 25 tablet 3  ? vitamin B-12 (CYANOCOBALAMIN) 1000 MCG tablet Take 1,000 mcg by mouth daily.    ? ?No current facility-administered medications on file prior to visit.  ? ? ?ALLERGIES: ?Allergies  ?Allergen Reactions  ? Niacin Other (See Comments)  ?  intol to NIACIN w/ headaches ?  ? Ativan [Lorazepam] Other (See Comments)  ?  agitation  ? ? ?FAMILY HISTORY: ?Family History  ?Problem Relation Age of Onset  ? Heart disease Mother   ?     Before age 42  ? Diabetes Mother   ? Kidney disease Mother   ? Heart attack Mother 3  ? Lung cancer Father 68  ? Diabetes Brother   ? Heart disease Brother   ? Heart disease Brother   ? Arthritis Brother   ? Diabetes Sister   ? Fibromyalgia Sister   ? Lung cancer Paternal Uncle   ?     questionable as to if it was lung ca  ? Healthy Daughter   ? Colon cancer Neg Hx   ? Stroke Neg Hx   ? ? ?  ?Objective:  ?Blood pressure (!) 159/72, pulse (!) 58, height '5\' 11"'$  (1.803 m), weight 215 lb (97.5 kg), SpO2 96 %. ?General: No acute distress.  Patient appears  well-groomed.   ?Head:  Normocephalic/atraumatic ?Eyes:  Fundi examined but not visualized ?Neck: supple, no paraspinal tenderness, full range of motion ?Heart:  Regular rate and rhythm ?Lungs:  Clear to auscultation bilaterally ?Back: No paraspinal tenderness ?Neurological Exam: alert and oriented to person, place, and time.  Speech fluent and not dysarthric, language intact.   ? ?  03/29/2022  ? 10:00 AM  ?[employer] Mental Exam  ?Weekday Correct 1  ?Current year 1  ?What state are  we in? 1  ?Amount spent 1  ?Amount left 2  ?# of Animals 2  ?5 objects recall 3  ?Number series 2  ?Hour markers 2  ?Time correct 1  ?Placed X in triangle correctly 1  ?Largest Figure 1  ?Name of male 2  ?Date back to work 2  ?Type of work 0  ?State she lived in 2  ?Total score 24  ? ?Slight left lower facial weakness.  Otherwise, CN II-XII intact. Bulk and tone normal, decreased left finger tapping speed and amplitude on left, muscle strength 5-/5 left upper extremity, otherwise 5/5.  Sensation to light touch intact.  Deep tendon reflexes 2+ throughout, toes downgoing.  Finger to nose testing intact.  Cautious broad-based.  Uses walker. ? ? ?Metta Clines, DO ? ?CC: Alma Friendly, NP ? ? ? ? ? ? ?

## 2022-03-28 ENCOUNTER — Ambulatory Visit: Payer: Medicare PPO | Admitting: Vascular Surgery

## 2022-03-28 ENCOUNTER — Other Ambulatory Visit: Payer: Self-pay

## 2022-03-28 ENCOUNTER — Encounter: Payer: Self-pay | Admitting: Vascular Surgery

## 2022-03-28 VITALS — BP 152/81 | HR 57 | Ht 73.0 in | Wt 215.2 lb

## 2022-03-28 DIAGNOSIS — Z9889 Other specified postprocedural states: Secondary | ICD-10-CM

## 2022-03-28 NOTE — Progress Notes (Signed)
? ? [facility] [place] ? ?Patient name: [name] [name] MRN: [id number] DOB: 1941/01/17 Sex: male ? ?REASON FOR VISIT: Follow-up prior carotid surgery ? ?HPI: ?[name] [name] is a 81 y.o. male here today for follow-up.  He is here today with his wife.  He is status post left carotid endarterectomy for severe asymptomatic disease on May 2022.  Also has history in the past of right carotid endarterectomy in 2016.  He recently was admitted to the hospital with a new right corona radiata CVA.  He was admitted on 02/28/2022.  He presented with left-sided weakness and slurred speech.  He is left-handed.  He is being seen by speech therapy and physical therapy following discharge.  He did undergo CT angiogram and I have reviewed these images.  This shows widely patent endarterectomy bilaterally with no extracranial source for stroke ? ?Past Medical History:  ?Diagnosis Date  ? Basal cell carcinoma 11/09/2019  ? nod & infil-behind right ear-cx3 &exc  ? Basal cell carcinoma 03/21/2020  ? Residual BCC with peripheral margin involved - ST recommends MOHs  ? Carotid artery occlusion   ? Coronary artery disease   ? s/p CABG 2005; sees Dr Johnsie Cancel yearly  ? Diabetes mellitus without complication ([place])   ? Gynecomastia   ? Hypercholesterolemia   ? Hypertension   ? Neurodermatitis   ? Overweight(278.02)   ? Personal history of colonic polyps 10/08/2006  ? tubular adenomas  ? Renal insufficiency   ? Stroke [facility])   ? TIA  April 2022  ? Transient ischemic attack 2010  ? "lasted ~ 5 seconds"  ? Vitamin D deficiency   ? ? ?Family History  ?Problem Relation Age of Onset  ? Heart disease Mother   ?     Before age 20  ? Diabetes Mother   ? Kidney disease Mother   ? Heart attack Mother 68  ? Lung cancer Father 64  ? Diabetes Brother   ? Heart disease Brother   ? Heart disease Brother   ? Arthritis Brother   ? Diabetes Sister   ? Fibromyalgia Sister   ? Lung cancer Paternal Uncle   ?      questionable as to if it was lung ca  ? Healthy Daughter   ? Colon cancer Neg Hx   ? Stroke Neg Hx   ? ? ?SOCIAL HISTORY: ?Social History  ? ?Tobacco Use  ? Smoking status: Never  ? Smokeless tobacco: Former  ?Substance Use Topics  ? Alcohol use: No  ?  Alcohol/week: 0.0 standard drinks  ? ? ?Allergies  ?Allergen Reactions  ? Niacin Other (See Comments)  ?  intol to NIACIN w/ headaches ?  ? Ativan [Lorazepam] Other (See Comments)  ?  agitation  ? ? ?Current Outpatient Medications  ?Medication Sig Dispense Refill  ? aspirin 81 MG chewable tablet Chew 1 tablet (81 mg total) by mouth daily. 90 tablet 0  ? atorvastatin (LIPITOR) 20 MG tablet Take 0.5 tablets (10 mg total) by mouth daily. For cholesterol. 45 tablet 1  ? cholecalciferol (VITAMIN D3) 25 MCG (1000 UNIT) tablet Take 1,000 Units by mouth daily.    ? clopidogrel (PLAVIX) 75 MG tablet Take 1 tablet (75 mg total) by mouth daily. 90 tablet 3  ? glucose blood (ACCU-CHEK GUIDE) test strip USE AS DIRECTED TO TEST BLOOD SUGAR UP TO 4 TIMES DAILY 100 strip 3  ? losartan (COZAAR) 25 MG tablet Take 2 tablets (50 mg total) by  mouth daily. For blood pressure. 180 tablet 1  ? nitroGLYCERIN (NITROSTAT) 0.4 MG SL tablet Place 1 tablet (0.4 mg total) under the tongue every 5 (five) minutes as needed for chest pain. 25 tablet 3  ? vitamin B-12 (CYANOCOBALAMIN) 1000 MCG tablet Take 1,000 mcg by mouth daily.    ? hydrochlorothiazide (MICROZIDE) 12.5 MG capsule Take 1 capsule (12.5 mg total) by mouth daily. (Patient not taking: Reported on 03/05/2022) 90 capsule 3  ? ?No current facility-administered medications for this visit.  ? ? ?REVIEW OF SYSTEMS:  ?'[X]'$  denotes positive finding, '[ ]'$  denotes negative finding ?Cardiac  Comments:  ?Chest pain or chest pressure:    ?Shortness of breath upon exertion:    ?Short of breath when lying flat:    ?Irregular heart rhythm:    ?    ?Vascular    ?Pain in calf, thigh, or hip brought on by ambulation:    ?Pain in feet at night that wakes you  up from your sleep:     ?Blood clot in your veins:    ?Leg swelling:     ?    ? ? ?PHYSICAL EXAM: ?Vitals:  ? 03/28/22 0938  ?BP: (!) 152/81  ?Pulse: (!) 57  ?Weight: 215 lb 3.2 oz (97.6 kg)  ?Height: '6\' 1"'$  (1.854 m)  ? ? ?GENERAL: The patient is a well-nourished male, in no acute distress. The vital signs are documented above. ?CARDIOVASCULAR: Well-healed carotid incisions bilaterally. ?PULMONARY: There is good air exchange  ?MUSCULOSKELETAL: There are no major deformities or cyanosis. ?NEUROLOGIC: No focal weakness or paresthesias are detected. ?SKIN: There are no ulcers or rashes noted. ?PSYCHIATRIC: The patient has a normal affect. ? ?DATA:  ?CT angiogram from 03/01/2022 revealed widely patent endarterectomies bilaterally. ? ?MEDICAL ISSUES: ?I discussed these findings with the patient.  His stroke was not in the carotid distribution in his endarterectomy is widely patent.  He was scheduled for carotid duplex today and explained that this was canceled due to his CT angiogram from 2 weeks ago.  We will continue his rehab follow-up and also with neurology.  He will see [place] again in 1 year with repeat carotid duplex ? ? ? ?Rosetta Posner, MD FACS ?[facility] [place] ?Office Tel [phone] ? ?Note: Portions of this report may have been transcribed using voice recognition software.  Every effort has been made to ensure accuracy; however, inadvertent computerized transcription errors may still be present. ?

## 2022-03-29 ENCOUNTER — Ambulatory Visit: Payer: Medicare PPO

## 2022-03-29 ENCOUNTER — Ambulatory Visit: Payer: Medicare PPO | Admitting: Neurology

## 2022-03-29 ENCOUNTER — Encounter: Payer: Self-pay | Admitting: Neurology

## 2022-03-29 VITALS — BP 159/72 | HR 58 | Ht 71.0 in | Wt 215.0 lb

## 2022-03-29 DIAGNOSIS — I6522 Occlusion and stenosis of left carotid artery: Secondary | ICD-10-CM | POA: Diagnosis not present

## 2022-03-29 DIAGNOSIS — E119 Type 2 diabetes mellitus without complications: Secondary | ICD-10-CM

## 2022-03-29 DIAGNOSIS — I10 Essential (primary) hypertension: Secondary | ICD-10-CM | POA: Diagnosis not present

## 2022-03-29 DIAGNOSIS — I639 Cerebral infarction, unspecified: Secondary | ICD-10-CM

## 2022-03-29 DIAGNOSIS — E782 Mixed hyperlipidemia: Secondary | ICD-10-CM | POA: Diagnosis not present

## 2022-03-29 NOTE — Patient Instructions (Signed)
Continue aspirin and Plavix '75mg'$  daily for 2 more months and then stop aspirin and continue Plavix '75mg'$  daily.   ?Continue atorvastatin ?Continue blood pressure and diabetes control ?Continue physical therapy, occupational therapy and speech therapy ?No driving for now.   ?If needed, we can start an antidepressant for depression.   ?Follow up in 5 months. ?

## 2022-04-02 ENCOUNTER — Ambulatory Visit (INDEPENDENT_AMBULATORY_CARE_PROVIDER_SITE_OTHER)
Admission: RE | Admit: 2022-04-02 | Discharge: 2022-04-02 | Disposition: A | Discharge status: Home / Self Care | Payer: Medicare PPO | Source: Ambulatory Visit | Attending: Primary Care | Admitting: Primary Care

## 2022-04-02 DIAGNOSIS — J690 Pneumonitis due to inhalation of food and vomit: Secondary | ICD-10-CM

## 2022-04-02 DIAGNOSIS — I700 Atherosclerosis of aorta: Secondary | ICD-10-CM | POA: Diagnosis not present

## 2022-04-02 DIAGNOSIS — Z951 Presence of aortocoronary bypass graft: Secondary | ICD-10-CM | POA: Diagnosis not present

## 2022-04-02 DIAGNOSIS — Z8701 Personal history of pneumonia (recurrent): Secondary | ICD-10-CM | POA: Diagnosis not present

## 2022-04-10 DIAGNOSIS — Z7902 Long term (current) use of antithrombotics/antiplatelets: Secondary | ICD-10-CM

## 2022-04-10 DIAGNOSIS — E7800 Pure hypercholesterolemia, unspecified: Secondary | ICD-10-CM

## 2022-04-10 DIAGNOSIS — I2510 Atherosclerotic heart disease of native coronary artery without angina pectoris: Secondary | ICD-10-CM | POA: Diagnosis not present

## 2022-04-10 DIAGNOSIS — M4802 Spinal stenosis, cervical region: Secondary | ICD-10-CM

## 2022-04-10 DIAGNOSIS — Z85828 Personal history of other malignant neoplasm of skin: Secondary | ICD-10-CM

## 2022-04-10 DIAGNOSIS — Z951 Presence of aortocoronary bypass graft: Secondary | ICD-10-CM

## 2022-04-10 DIAGNOSIS — E559 Vitamin D deficiency, unspecified: Secondary | ICD-10-CM

## 2022-04-10 DIAGNOSIS — I69320 Aphasia following cerebral infarction: Secondary | ICD-10-CM | POA: Diagnosis not present

## 2022-04-10 DIAGNOSIS — R1312 Dysphagia, oropharyngeal phase: Secondary | ICD-10-CM | POA: Diagnosis not present

## 2022-04-10 DIAGNOSIS — I083 Combined rheumatic disorders of mitral, aortic and tricuspid valves: Secondary | ICD-10-CM | POA: Diagnosis not present

## 2022-04-10 DIAGNOSIS — M47812 Spondylosis without myelopathy or radiculopathy, cervical region: Secondary | ICD-10-CM

## 2022-04-10 DIAGNOSIS — I119 Hypertensive heart disease without heart failure: Secondary | ICD-10-CM | POA: Diagnosis not present

## 2022-04-10 DIAGNOSIS — I69354 Hemiplegia and hemiparesis following cerebral infarction affecting left non-dominant side: Secondary | ICD-10-CM | POA: Diagnosis not present

## 2022-04-10 DIAGNOSIS — I69322 Dysarthria following cerebral infarction: Secondary | ICD-10-CM | POA: Diagnosis not present

## 2022-04-10 DIAGNOSIS — Z955 Presence of coronary angioplasty implant and graft: Secondary | ICD-10-CM

## 2022-04-10 DIAGNOSIS — E663 Overweight: Secondary | ICD-10-CM

## 2022-04-10 DIAGNOSIS — Z9181 History of falling: Secondary | ICD-10-CM

## 2022-04-10 DIAGNOSIS — J9811 Atelectasis: Secondary | ICD-10-CM

## 2022-04-10 DIAGNOSIS — Z7982 Long term (current) use of aspirin: Secondary | ICD-10-CM

## 2022-04-10 DIAGNOSIS — Z6826 Body mass index (BMI) 26.0-26.9, adult: Secondary | ICD-10-CM

## 2022-04-10 DIAGNOSIS — I69391 Dysphagia following cerebral infarction: Secondary | ICD-10-CM | POA: Diagnosis not present

## 2022-04-10 DIAGNOSIS — E1121 Type 2 diabetes mellitus with diabetic nephropathy: Secondary | ICD-10-CM | POA: Diagnosis not present

## 2022-04-11 ENCOUNTER — Telehealth: Payer: Self-pay

## 2022-04-11 NOTE — Telephone Encounter (Signed)
New message   Per [facility] sent a fax on 3.9.2023  &  a couple of times since then   This is a month out of Medicare compliance.   Please advise

## 2022-04-16 NOTE — Telephone Encounter (Signed)
I have signed NUMEROUS faxes for this patient for [facility]. ?Joellen, can you look into this? ?

## 2022-04-17 ENCOUNTER — Telehealth: Payer: Self-pay | Admitting: Primary Care

## 2022-04-17 DIAGNOSIS — Z8673 Personal history of transient ischemic attack (TIA), and cerebral infarction without residual deficits: Secondary | ICD-10-CM

## 2022-04-17 DIAGNOSIS — I6381 Other cerebral infarction due to occlusion or stenosis of small artery: Secondary | ICD-10-CM

## 2022-04-17 NOTE — Telephone Encounter (Signed)
Re faxed all orders that I have received. I have called office and left message to let them know. Asked that if she needs anything further that she call office.  ?

## 2022-04-17 NOTE — Telephone Encounter (Signed)
Left message to return call to our office.  

## 2022-04-17 NOTE — Telephone Encounter (Signed)
Pt wife called stating that she needs an order for pt to go to the Out Patient Rehab at [place]. Please advise. Phone # [phone] ?

## 2022-04-18 ENCOUNTER — Telehealth: Payer: Self-pay | Admitting: Neurology

## 2022-04-18 NOTE — Telephone Encounter (Signed)
Noted  Referral placed.

## 2022-04-18 NOTE — Addendum Note (Signed)
Addended by: Pleas Koch on: 04/18/2022 06:36 PM ? ? Modules accepted: Orders ? ?

## 2022-04-18 NOTE — Telephone Encounter (Signed)
Pt is sch for 04-20-22 and will call us back if he needs to change once he gets home to look at his schedule ?

## 2022-04-18 NOTE — Telephone Encounter (Signed)
Telephone call to [name] [name], Per [name] [name] the patient had an episode yesterday morning where he was unable to speak finds words. Per [name] [name] this not normal for the patient. ? ?The wife complained to her that he had some trouble communicating as well. ? ?Please advise if patient appt needs to be sooner then 05/08/22. ? ? ?

## 2022-04-18 NOTE — Telephone Encounter (Signed)
Deidre from [facility] called stating this patient needs to be seen to rule out TIA.  She wanted to move his appt up sooner. ?

## 2022-04-18 NOTE — Telephone Encounter (Signed)
[name] [name] returned call re: Physical therapy ? ?PT is in reference to stroke that her husband had,  he has been receiving PT at home, however the therapist believes he is strong enough now to receive outpatient therapy ? ?Please call [name] [name] at [phone] ?

## 2022-04-19 ENCOUNTER — Telehealth: Payer: Self-pay

## 2022-04-19 DIAGNOSIS — Z8673 Personal history of transient ischemic attack (TIA), and cerebral infarction without residual deficits: Secondary | ICD-10-CM

## 2022-04-19 MED ORDER — CLOPIDOGREL BISULFATE 75 MG PO TABS: 75.0000 mg | ORAL_TABLET | Freq: Every day | ORAL | 3 refills | Status: DC

## 2022-04-19 NOTE — Telephone Encounter (Signed)
Refill sent to pharmacy.   

## 2022-04-19 NOTE — Telephone Encounter (Signed)
Pt's wife called for a refill on Plavix. Michela Pitcher it looks like Anda Kraft has never written it. Rehab had written before. He will be out of medication Sunday. Can Anda Kraft take over the refill and send to [facility]? Please let her know if it cannot be done [phone] or [phone] ?

## 2022-04-20 ENCOUNTER — Other Ambulatory Visit: Payer: Self-pay | Admitting: Neurology

## 2022-04-20 ENCOUNTER — Ambulatory Visit: Payer: Medicare PPO | Admitting: Family Medicine

## 2022-04-20 ENCOUNTER — Encounter: Payer: Self-pay | Admitting: Family Medicine

## 2022-04-20 ENCOUNTER — Encounter: Payer: Self-pay | Admitting: Neurology

## 2022-04-20 ENCOUNTER — Telehealth: Payer: Self-pay

## 2022-04-20 ENCOUNTER — Telehealth (INDEPENDENT_AMBULATORY_CARE_PROVIDER_SITE_OTHER): Payer: Medicare PPO | Admitting: Neurology

## 2022-04-20 DIAGNOSIS — I639 Cerebral infarction, unspecified: Secondary | ICD-10-CM

## 2022-04-20 DIAGNOSIS — Z7902 Long term (current) use of antithrombotics/antiplatelets: Secondary | ICD-10-CM | POA: Diagnosis not present

## 2022-04-20 DIAGNOSIS — E785 Hyperlipidemia, unspecified: Secondary | ICD-10-CM | POA: Diagnosis not present

## 2022-04-20 DIAGNOSIS — E119 Type 2 diabetes mellitus without complications: Secondary | ICD-10-CM | POA: Diagnosis not present

## 2022-04-20 DIAGNOSIS — Z9862 Peripheral vascular angioplasty status: Secondary | ICD-10-CM | POA: Diagnosis not present

## 2022-04-20 DIAGNOSIS — I6522 Occlusion and stenosis of left carotid artery: Secondary | ICD-10-CM | POA: Diagnosis not present

## 2022-04-20 DIAGNOSIS — G459 Transient cerebral ischemic attack, unspecified: Secondary | ICD-10-CM | POA: Diagnosis not present

## 2022-04-20 DIAGNOSIS — I10 Essential (primary) hypertension: Secondary | ICD-10-CM

## 2022-04-20 DIAGNOSIS — Z8673 Personal history of transient ischemic attack (TIA), and cerebral infarction without residual deficits: Secondary | ICD-10-CM

## 2022-04-20 DIAGNOSIS — I2510 Atherosclerotic heart disease of native coronary artery without angina pectoris: Secondary | ICD-10-CM | POA: Diagnosis not present

## 2022-04-20 DIAGNOSIS — E782 Mixed hyperlipidemia: Secondary | ICD-10-CM

## 2022-04-20 DIAGNOSIS — I4891 Unspecified atrial fibrillation: Secondary | ICD-10-CM

## 2022-04-20 NOTE — Telephone Encounter (Signed)
[name] [name] speech therapist with [name] [name] was with pt; pt exercised about 2 hours ago but since then has been resting; [name] [name] took BP 175/99 P 74  when got to pts home. Pt BP usually is 120-130/70's - 80's/pt is asymptomatic, no H/A,dizziness, CP,SOB or vision changes and no new weakness in extremities. [name] [name] recked BP after pt sitting and resting more 171/102. Pt usually takes Losartan 25 mg taking 2 tabs daily around 7 AM. Pt has not missed any meds this week. [name] [name] scheduled appt for pt with Dr Diona Browner 04/20/22 at 4 PM. UC & ED precautions given and pt's wife voiced understanding. Sending note to DR Diona Browner and Butch Penny CMA. ?

## 2022-04-20 NOTE — Patient Instructions (Signed)
If BP remains  > 140/90 consistently over the next few days.. restart the HCTZ 12.5 mg daily. ? Continue losartan 2 tabs 25 mg daily. ? ?

## 2022-04-20 NOTE — Progress Notes (Signed)
? ?Virtual Visit via Video Note ?The purpose of this virtual visit is to provide medical care while limiting exposure to the novel coronavirus.   ? ?Consent was obtained for video visit:  Yes.   ?Answered questions that patient had about telehealth interaction:  Yes.   ?I discussed the limitations, risks, security and privacy concerns of performing an evaluation and management service by telemedicine. I also discussed with the patient that there may be a patient responsible charge related to this service. The patient expressed understanding and agreed to proceed. ? ?Pt location: Home ?Physician Location: office ?Name of referring provider:  Pleas Koch, NP ?I connected with [name] [name] at patients initiation/request on 04/20/2022 at  2:50 PM EDT by video enabled telemedicine application and verified that I am speaking with the correct person using two identifiers. ?Pt MRN:  [id number] ?Pt DOB:  1941-10-29 ?Video Participants:  [name] [name];  wife, daughter ? ?Assessment and Plan:   ?Transient ischemic attack - at this time, he is on maximum medical management.  He is on dual antiplatelet therapy, on a statin with LDL at goal, Hgb A1c at goal and without any significant large vessel stenosis.  From that standpoint, there isn't any recommended changes in management.  I would like to evaluate for possible underlying atrial fibrillation as that would change management by changing antithrombotic therapy to an anticoagulant.   ?Right corona radiata stroke ?Asymptomatic left internal carotid artery stenosis status post carotid endarterectomy ?Status post right carotid endarterectomy ?Type 2 diabetes mellitus ?Hypertension ?Hyperlipidemia ?Coronary artery disease ?  ?1  Check 30 day cardiac event monitor ?2  Because he has some amnesia to the event, I want to check an EEG as well. ?3  Secondary stroke prevention as managed by PCP/cardiology: ?            - Plavix '75mg'$  daily and ASA '81mg'$  daily until 6/1, then  Plavix '75mg'$  daily alone ?            - Statin.  LDL goal less than 70 ?            - Hgb A1c goal less than 7 ?            - Normotensive blood pressure ?4  Continue PT/OT/Speech. ?5    If depressed mood is a problem, consider antidepressant. ?6.  Follow up 4 months ? ?History of Present Illness:  ?[name] [name] is a 81 year old left-handed male with HTN, HLD, DMII, CAD, carotid artery disease s/p R CEA and history of TIA who follows up for possible TIA. ?  ?UPDATE: ?Current medications: Plavix '75mg'$  daily, ASA '81mg'$  daily, atorvastatin '10mg'$  daily, HCTZ ?  ?Currently receiving  PT/OT/Speech.  On Tuesday morning, he had started having language difficulty.  He struggled to get words out.  It started to frustrate him.  No slurred speech, facial droop, or unilateral numbness or weakness.  Symptoms lasted 30 minutes.  He remembers some bits but doesn't remember most of the event. Today blood pressure has been 160-170s/70s-100.  Prior to today, then ave been in the 130s/60s.  He has an appointment with his PCP later today.  Denies palpitations.   ?  ?HISTORY: ?He was admitted to [facility] from 04/03/2021 to 04/06/2021 for transient left-sided numbness and weakness lasting an hour.  CT head was negative for follow up MRI of brain showed acute small right thalamic infarct with chronic small vessel ischemic changes and multiple chronic lacunar  infarcts.  CTA of head and neck showed 70-80% left ICA stenosis.  2D echo showed EF 60-65% with no cardiac source of embolus.  LDL was 86 and Hgb A1c 6.7.  Prior to admission, hne was on ASA '81mg'$  daily.  He was discharged on ASA '81mg'$  and Plavix '75mg'$  daily for 3 weeks followed by Plavix alone.  Lipitor was increased from 10 to '40mg'$  daily. Underwent carotid endarterectomy on 05/18/2021 for asymptomatic left ICA stenosis. ? ?Patient was admitted to the hospital on 02/28/2022 for 2-3 days of left leg weakness, left facial weakness and slurred speech.   MRI of brain showed acute infarct  within the right corona radiata.  CTA head and neck showed calcified atherosclerotic plaque in the intercranial ICAs causing mild-moderate stenosis on left and mild on right, left and right carotid endarterectomy with no residual significant stenosis or occlusion, and unchanged moderate right and mild left vertebral artery stenosis.  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  2D echo showed LVEF 55-60% with Grade I diastolic dysfunction, trival MR, aortic valve sclerosis with no evidence of stenosis with no thrombus or atrial level shunt.  LDL 55 and Hgb A1c 6.1.  He was discharged on ASA and Plavix for 3 months followed by Plavix alone.  ? ?Past Medical History: ?Past Medical History:  ?Diagnosis Date  ? Basal cell carcinoma 11/09/2019  ? nod & infil-behind right ear-cx3 &exc  ? Basal cell carcinoma 03/21/2020  ? Residual BCC with peripheral margin involved - ST recommends MOHs  ? Carotid artery occlusion   ? Coronary artery disease   ? s/p CABG 2005; sees Dr Johnsie Cancel yearly  ? Diabetes mellitus without complication ([place])   ? Gynecomastia   ? Hypercholesterolemia   ? Hypertension   ? Neurodermatitis   ? Overweight(278.02)   ? Personal history of colonic polyps 10/08/2006  ? tubular adenomas  ? Renal insufficiency   ? Stroke [facility])   ? TIA  April 2022  ? Transient ischemic attack 2010  ? "lasted ~ 5 seconds"  ? Vitamin D deficiency   ? ? ?Medications: ?Outpatient Encounter Medications as of 04/20/2022  ?Medication Sig  ? aspirin 81 MG chewable tablet Chew 1 tablet (81 mg total) by mouth daily.  ? atorvastatin (LIPITOR) 20 MG tablet Take 0.5 tablets (10 mg total) by mouth daily. For cholesterol.  ? cholecalciferol (VITAMIN D3) 25 MCG (1000 UNIT) tablet Take 1,000 Units by mouth daily.  ? clopidogrel (PLAVIX) 75 MG tablet Take 1 tablet (75 mg total) by mouth daily.  ? glucose blood (ACCU-CHEK GUIDE) test  strip USE AS DIRECTED TO TEST BLOOD SUGAR UP TO 4 TIMES DAILY  ? losartan (COZAAR) 25 MG tablet Take 2 tablets (50 mg total) by mouth daily. For blood pressure.  ? nitroGLYCERIN (NITROSTAT) 0.4 MG SL tablet Place 1 tablet (0.4 mg total) under the tongue every 5 (five) minutes as needed for chest pain.  ? vitamin B-12 (CYANOCOBALAMIN) 1000 MCG tablet Take 1,000 mcg by mouth daily.  ? hydrochlorothiazide (HYDRODIURIL) 12.5 MG tablet Take 12.5 mg by mouth daily at 2 PM. On hold for now.  ? [DISCONTINUED] clopidogrel (PLAVIX) 75 MG tablet Take 1 tablet (75 mg total) by mouth daily.  ? ?No facility-administered encounter medications on file as of 04/20/2022.  ? ? ?Allergies: ?Allergies  ?Allergen Reactions  ? Niacin Other (See Comments)  ?  intol to NIACIN  w/ headaches ?  ? Ativan [Lorazepam] Other (See Comments)  ?  agitation  ? ? ?Family History: ?Family History  ?Problem Relation Age of Onset  ? Heart disease Mother   ?     Before age 85  ? Diabetes Mother   ? Kidney disease Mother   ? Heart attack Mother 30  ? Lung cancer Father 53  ? Diabetes Brother   ? Heart disease Brother   ? Heart disease Brother   ? Arthritis Brother   ? Diabetes Sister   ? Fibromyalgia Sister   ? Lung cancer Paternal Uncle   ?     questionable as to if it was lung ca  ? Healthy Daughter   ? Colon cancer Neg Hx   ? Stroke Neg Hx   ? ? ?Observations/Objective:   ?No acute distress.  Alert and oriented.  Speech fluent and not dysarthric.  Language intact.  Eyes orthophoric on primary gaze.  Face symmetric. ? ? ?Follow Up Instructions: ?  ? -I discussed the assessment and treatment plan with the patient. The patient was provided an opportunity to ask questions and all were answered. The patient agreed with the plan and demonstrated an understanding of the instructions. ?  ?The patient was advised to call back or seek an in-person evaluation if the symptoms worsen or if the condition fails to improve as anticipated. ? ? ? ?Dudley Major, DO ? ?

## 2022-04-20 NOTE — Patient Instructions (Signed)
Check 30 day cardiac event monitor ?Check EEG ?Continue aspirin '81mg'$  daily and Plavix '75mg'$  daily until June 1, then Plavix alone ?Continue atorvastatin ?Follow up with PCP regarding blood pressure ?Continue diabetes management ?Smoking cessation ?Follow up 4 months. ?

## 2022-04-20 NOTE — Progress Notes (Signed)
Patient ID: [name] [name], male    DOB: Oct 05, 1941, 81 y.o.   MRN: [id number]  This visit was conducted in person.  BP (!) 158/78 (BP Location: Right Arm, Patient Position: Sitting, Cuff Size: Large)   Pulse 64   Temp (!) 97.3 F (36.3 C) (Oral)   Ht '6\' 1"'$  (1.854 m)   Wt 215 lb 4.8 oz (97.7 kg)   SpO2 96%   BMI 28.41 kg/m    CC:  Chief Complaint  Patient presents with   Hypertension    Subjective:   HPI: [name] [name] is a 81 y.o. male patient of Tawni Millers with history of coronary atherosclerosis, hypertension, CVA (right thalamic infarction 03/2022), type 2 diabetes and transient cerebral ischemia presenting on 04/20/2022 for elevated blood pressure at speech therapy, following physical therapy.  4/18 had an additional spell of trouble with getting words outs lasted 30 min.. possible TIA No headache  4/19 Yesterday. 132/60 Following 2 hours of exercise blood pressure increased to  158/70175/99, 171/102 , pulse 74  30 min later when wife checked it 172/112.  He has felt anxious about the blood pressure. He denied new headache dizziness chest pain shortness of breath or vision changes.  No new neuro changes or new weakness in extremities. He has not missed any doses of his losartan 25 mg 2 tablets daily.  He took this this morning. His HCTZ is on hold I reviewed the video visit from earlier today from Dr. Tomi Likens regarding history of TIA, bilateral carotid artery stenosis, status post right carotid endarterectomy He is moving forward with evaluation for underlying atrial fibrillation.  They have pending 30-day cardiac event monitor. EEG planned 04/25/2022 Referred to cardiology.  History of recent CVA: He was admitted to [facility] from 04/03/2021 to 04/06/2021 for transient left-sided numbness and weakness lasting an hour.  CT head was negative for follow up MRI of brain showed acute small right thalamic infarct with chronic small vessel ischemic changes and multiple  chronic lacunar infarcts.  CTA of head and neck showed 70-80% left ICA stenosis.  2D echo showed EF 60-65% with no cardiac source of embolus.  LDL was 86 and Hgb A1c 6.7.  Prior to admission, hne was on ASA '81mg'$  daily.  He was discharged on ASA '81mg'$  and Plavix '75mg'$  daily for 3 weeks followed by Plavix alone.  Lipitor was increased from 10 to '40mg'$  daily. Underwent carotid endarterectomy on 05/18/2021 for asymptomatic left ICA stenosis.    Relevant past medical, surgical, family and social history reviewed and updated as indicated. Interim medical history since our last visit reviewed. Allergies and medications reviewed and updated. Outpatient Medications Prior to Visit  Medication Sig Dispense Refill   aspirin 81 MG chewable tablet Chew 1 tablet (81 mg total) by mouth daily. 90 tablet 0   atorvastatin (LIPITOR) 20 MG tablet Take 0.5 tablets (10 mg total) by mouth daily. For cholesterol. 45 tablet 1   cholecalciferol (VITAMIN D3) 25 MCG (1000 UNIT) tablet Take 1,000 Units by mouth daily.     clopidogrel (PLAVIX) 75 MG tablet Take 1 tablet (75 mg total) by mouth daily. 90 tablet 3   glucose blood (ACCU-CHEK GUIDE) test strip USE AS DIRECTED TO TEST BLOOD SUGAR UP TO 4 TIMES DAILY 100 strip 3   hydrochlorothiazide (HYDRODIURIL) 12.5 MG tablet Take 12.5 mg by mouth daily at 2 PM. On hold for now.     losartan (COZAAR) 25 MG tablet Take 2 tablets (50 mg  total) by mouth daily. For blood pressure. 180 tablet 1   nitroGLYCERIN (NITROSTAT) 0.4 MG SL tablet Place 1 tablet (0.4 mg total) under the tongue every 5 (five) minutes as needed for chest pain. 25 tablet 3   vitamin B-12 (CYANOCOBALAMIN) 1000 MCG tablet Take 1,000 mcg by mouth daily.     No facility-administered medications prior to visit.     Per HPI unless specifically indicated in ROS section below Review of Systems  Constitutional:  Negative for fatigue and fever.  HENT:  Negative for ear pain.   Eyes:  Negative for pain.  Respiratory:   Negative for cough and shortness of breath.   Cardiovascular:  Negative for chest pain, palpitations and leg swelling.  Gastrointestinal:  Negative for abdominal pain.  Genitourinary:  Negative for dysuria.  Musculoskeletal:  Negative for arthralgias.  Neurological:  Negative for syncope, light-headedness and headaches.  Psychiatric/Behavioral:  Negative for dysphoric mood.   Objective:  BP (!) 158/78 (BP Location: Right Arm, Patient Position: Sitting, Cuff Size: Large)   Pulse 64   Temp (!) 97.3 F (36.3 C) (Oral)   Ht '6\' 1"'$  (1.854 m)   Wt 215 lb 4.8 oz (97.7 kg)   SpO2 96%   BMI 28.41 kg/m   Wt Readings from Last 3 Encounters:  04/20/22 215 lb 4.8 oz (97.7 kg)  03/29/22 215 lb (97.5 kg)  03/28/22 215 lb 3.2 oz (97.6 kg)      Physical Exam Constitutional:      Appearance: He is well-developed.  HENT:     Head: Normocephalic.     Right Ear: Hearing normal.     Left Ear: Hearing normal.     Nose: Nose normal.  Neck:     Thyroid: No thyroid mass or thyromegaly.     Vascular: No carotid bruit.     Trachea: Trachea normal.  Cardiovascular:     Rate and Rhythm: Normal rate and regular rhythm.     Pulses: Normal pulses.     Heart sounds: Heart sounds not distant. No murmur heard.   No friction rub. No gallop.     Comments: No peripheral edema Pulmonary:     Effort: Pulmonary effort is normal. No respiratory distress.     Breath sounds: Normal breath sounds.  Skin:    General: Skin is warm and dry.     Findings: No rash.  Psychiatric:        Speech: Speech normal.        Behavior: Behavior normal.        Thought Content: Thought content normal.      Results for orders placed or performed during the hospital encounter of 02/28/22  Resp Panel by RT-PCR (Flu A&B, Covid) Nasopharyngeal Swab   Specimen: Nasopharyngeal Swab; Nasopharyngeal(NP) swabs in vial transport medium  Result Value Ref Range   SARS Coronavirus 2 by RT PCR NEGATIVE NEGATIVE   Influenza A by PCR  NEGATIVE NEGATIVE   Influenza B by PCR NEGATIVE NEGATIVE  Culture, blood (routine x 2)   Specimen: BLOOD LEFT HAND  Result Value Ref Range   Specimen Description BLOOD LEFT HAND    Special Requests      BOTTLES DRAWN AEROBIC AND ANAEROBIC Blood Culture adequate volume   Culture      NO GROWTH 5 DAYS Performed at [facility] [address], [place], [place] [address]    Report Status 03/05/2022 FINAL   Respiratory (~20 pathogens) panel by PCR   Specimen: Nasopharyngeal Swab;  Respiratory  Result Value Ref Range   Adenovirus NOT DETECTED NOT DETECTED   Coronavirus 229E NOT DETECTED NOT DETECTED   Coronavirus HKU1 NOT DETECTED NOT DETECTED   Coronavirus NL63 NOT DETECTED NOT DETECTED   Coronavirus OC43 NOT DETECTED NOT DETECTED   Metapneumovirus NOT DETECTED NOT DETECTED   Rhinovirus / Enterovirus NOT DETECTED NOT DETECTED   Influenza A NOT DETECTED NOT DETECTED   Influenza B NOT DETECTED NOT DETECTED   Parainfluenza Virus 1 NOT DETECTED NOT DETECTED   Parainfluenza Virus 2 NOT DETECTED NOT DETECTED   Parainfluenza Virus 3 NOT DETECTED NOT DETECTED   Parainfluenza Virus 4 NOT DETECTED NOT DETECTED   Respiratory Syncytial Virus NOT DETECTED NOT DETECTED   Bordetella pertussis NOT DETECTED NOT DETECTED   Bordetella Parapertussis NOT DETECTED NOT DETECTED   Chlamydophila pneumoniae NOT DETECTED NOT DETECTED   Mycoplasma pneumoniae NOT DETECTED NOT DETECTED  CBC with Differential  Result Value Ref Range   WBC 6.5 4.0 - 10.5 K/uL   RBC 4.30 4.22 - 5.81 MIL/uL   Hemoglobin 15.1 13.0 - 17.0 g/dL   HCT 43.4 39.0 - 52.0 %   MCV 100.9 (H) 80.0 - 100.0 fL   MCH 35.1 (H) 26.0 - 34.0 pg   MCHC 34.8 30.0 - 36.0 g/dL   RDW 13.2 11.5 - 15.5 %   Platelets 226 150 - 400 K/uL   nRBC 0.0 0.0 - 0.2 %   Neutrophils Relative % 68 %   Neutro Abs 4.5 1.7 - 7.7 K/uL   Lymphocytes Relative 18 %   Lymphs Abs 1.2 0.7 - 4.0 K/uL   Monocytes Relative 10 %   Monocytes Absolute 0.7 0.1 - 1.0  K/uL   Eosinophils Relative 2 %   Eosinophils Absolute 0.1 0.0 - 0.5 K/uL   Basophils Relative 1 %   Basophils Absolute 0.0 0.0 - 0.1 K/uL   Immature Granulocytes 1 %   Abs Immature Granulocytes 0.04 0.00 - 0.07 K/uL  APTT  Result Value Ref Range   aPTT 29 24 - 36 seconds  Protime-INR  Result Value Ref Range   Prothrombin Time 13.5 11.4 - 15.2 seconds   INR 1.0 0.8 - 1.2  Comprehensive metabolic panel  Result Value Ref Range   Sodium 138 135 - 145 mmol/L   Potassium 3.8 3.5 - 5.1 mmol/L   Chloride 102 98 - 111 mmol/L   CO2 28 22 - 32 mmol/L   Glucose, Bld 120 (H) 70 - 99 mg/dL   BUN 15 8 - 23 mg/dL   Creatinine, Ser 1.14 0.61 - 1.24 mg/dL   Calcium 8.8 (L) 8.9 - 10.3 mg/dL   Total Protein 6.7 6.5 - 8.1 g/dL   Albumin 3.6 3.5 - 5.0 g/dL   AST 19 15 - 41 U/L   ALT 17 0 - 44 U/L   Alkaline Phosphatase 86 38 - 126 U/L   Total Bilirubin 1.1 0.3 - 1.2 mg/dL   GFR, Estimated >60 >60 mL/min   Anion gap 8 5 - 15  Urinalysis, Routine w reflex microscopic Urine, Clean Catch  Result Value Ref Range   Color, Urine STRAW (A) YELLOW   APPearance CLEAR CLEAR   Specific Gravity, Urine 1.005 1.005 - 1.030   pH 5.0 5.0 - 8.0   Glucose, UA NEGATIVE NEGATIVE mg/dL   Hgb urine dipstick NEGATIVE NEGATIVE   Bilirubin Urine NEGATIVE NEGATIVE   Ketones, ur NEGATIVE NEGATIVE mg/dL   Protein, ur NEGATIVE NEGATIVE mg/dL   Nitrite NEGATIVE NEGATIVE  Leukocytes,Ua NEGATIVE NEGATIVE  Urine rapid drug screen (hosp performed)  Result Value Ref Range   Opiates NONE DETECTED NONE DETECTED   Cocaine NONE DETECTED NONE DETECTED   Benzodiazepines NONE DETECTED NONE DETECTED   Amphetamines NONE DETECTED NONE DETECTED   Tetrahydrocannabinol NONE DETECTED NONE DETECTED   Barbiturates NONE DETECTED NONE DETECTED  Procalcitonin - Baseline  Result Value Ref Range   Procalcitonin <0.10 ng/mL  Procalcitonin  Result Value Ref Range   Procalcitonin <0.10 ng/mL  Hemoglobin A1c  Result Value Ref Range    Hgb A1c MFr Bld 6.1 (H) 4.8 - 5.6 %   Mean Plasma Glucose 128.37 mg/dL  Lipid panel  Result Value Ref Range   Cholesterol 105 0 - 200 mg/dL   Triglycerides 120 <150 mg/dL   HDL 26 (L) >40 mg/dL   Total CHOL/HDL Ratio 4.0 RATIO   VLDL 24 0 - 40 mg/dL   LDL Cholesterol 55 0 - 99 mg/dL  Glucose, capillary  Result Value Ref Range   Glucose-Capillary 119 (H) 70 - 99 mg/dL   Comment 1 Notify RN    Comment 2 Document in Chart   Procalcitonin  Result Value Ref Range   Procalcitonin <0.10 ng/mL  CBC with Differential/Platelet  Result Value Ref Range   WBC 6.7 4.0 - 10.5 K/uL   RBC 4.01 (L) 4.22 - 5.81 MIL/uL   Hemoglobin 13.8 13.0 - 17.0 g/dL   HCT 40.1 39.0 - 52.0 %   MCV 100.0 80.0 - 100.0 fL   MCH 34.4 (H) 26.0 - 34.0 pg   MCHC 34.4 30.0 - 36.0 g/dL   RDW 13.2 11.5 - 15.5 %   Platelets 212 150 - 400 K/uL   nRBC 0.0 0.0 - 0.2 %   Neutrophils Relative % 63 %   Neutro Abs 4.3 1.7 - 7.7 K/uL   Lymphocytes Relative 19 %   Lymphs Abs 1.3 0.7 - 4.0 K/uL   Monocytes Relative 12 %   Monocytes Absolute 0.8 0.1 - 1.0 K/uL   Eosinophils Relative 4 %   Eosinophils Absolute 0.2 0.0 - 0.5 K/uL   Basophils Relative 1 %   Basophils Absolute 0.0 0.0 - 0.1 K/uL   Immature Granulocytes 1 %   Abs Immature Granulocytes 0.04 0.00 - 0.07 K/uL  Basic metabolic panel  Result Value Ref Range   Sodium 137 135 - 145 mmol/L   Potassium 3.4 (L) 3.5 - 5.1 mmol/L   Chloride 102 98 - 111 mmol/L   CO2 29 22 - 32 mmol/L   Glucose, Bld 107 (H) 70 - 99 mg/dL   BUN 15 8 - 23 mg/dL   Creatinine, Ser 1.10 0.61 - 1.24 mg/dL   Calcium 8.3 (L) 8.9 - 10.3 mg/dL   GFR, Estimated >60 >60 mL/min   Anion gap 6 5 - 15  Glucose, capillary  Result Value Ref Range   Glucose-Capillary 118 (H) 70 - 99 mg/dL  Glucose, capillary  Result Value Ref Range   Glucose-Capillary 113 (H) 70 - 99 mg/dL  Glucose, capillary  Result Value Ref Range   Glucose-Capillary 99 70 - 99 mg/dL   Comment 1 Notify RN    Comment 2  Document in Chart   Glucose, capillary  Result Value Ref Range   Glucose-Capillary 123 (H) 70 - 99 mg/dL   Comment 1 Notify RN    Comment 2 Document in Chart   Glucose, capillary  Result Value Ref Range   Glucose-Capillary 104 (H) 70 - 99 mg/dL  Glucose, capillary  Result Value Ref Range   Glucose-Capillary 98 70 - 99 mg/dL  Glucose, capillary  Result Value Ref Range   Glucose-Capillary 144 (H) 70 - 99 mg/dL  Glucose, capillary  Result Value Ref Range   Glucose-Capillary 106 (H) 70 - 99 mg/dL  Glucose, capillary  Result Value Ref Range   Glucose-Capillary 104 (H) 70 - 99 mg/dL  Glucose, capillary  Result Value Ref Range   Glucose-Capillary 118 (H) 70 - 99 mg/dL  CBG monitoring, ED  Result Value Ref Range   Glucose-Capillary 119 (H) 70 - 99 mg/dL  CBG monitoring, ED  Result Value Ref Range   Glucose-Capillary 102 (H) 70 - 99 mg/dL   Comment 1 Notify RN    Comment 2 Document in Chart   CBG monitoring, ED  Result Value Ref Range   Glucose-Capillary 109 (H) 70 - 99 mg/dL   Comment 1 Notify RN   CBG monitoring, ED  Result Value Ref Range   Glucose-Capillary 114 (H) 70 - 99 mg/dL  CBG monitoring, ED  Result Value Ref Range   Glucose-Capillary 141 (H) 70 - 99 mg/dL  ECHOCARDIOGRAM COMPLETE  Result Value Ref Range   Weight 3,520 oz   Height 73 in   BP 132/64 mmHg   S' Lateral 3.60 cm   AR max vel 2.85 cm2   AV Area VTI 2.88 cm2   AV Mean grad 3.0 mmHg   AV Peak grad 6.2 mmHg   Ao pk vel 1.24 m/s   Area-P 1/2 3.77 cm2   AV Area mean vel 2.58 cm2    This visit occurred during the SARS-CoV-2 public health emergency.  Safety protocols were in place, including screening questions prior to the visit, additional usage of staff PPE, and extensive cleaning of exam room while observing appropriate contact time as indicated for disinfecting solutions.   COVID 19 screen:  No recent travel or known exposure to COVID19 The patient denies respiratory symptoms of COVID 19 at this  time. The importance of social distancing was discussed today.   Assessment and Plan     Eliezer Lofts, MD

## 2022-04-25 ENCOUNTER — Ambulatory Visit: Payer: Medicare PPO | Admitting: Neurology

## 2022-04-25 DIAGNOSIS — G459 Transient cerebral ischemic attack, unspecified: Secondary | ICD-10-CM

## 2022-04-25 NOTE — Procedures (Signed)
ELECTROENCEPHALOGRAM REPORT ? ?Date of Study: 04/25/2022 ? ?Patient's Name: [name] [name] ?MRN: [id number] ?Date of Birth: [birth year] ? ? ?Clinical History: 81 year old male with history of stroke who had episode of difficulty getting words out.  Amnestic to event. ? ?Medications: ?aspirin 81 MG chewable tablet ?LIPITOR 20 MG tablet ?VITAMIN D3 25 MCG (1000 UNIT) tablet ?COZAAR 25 MG tablet ?NITROSTAT 0.4 MG SL tablet ?CYANOCOBALAMIN 1000 MCG tablet ?HYDRODIURIL 12.5 MG tablet ? ?Technical Summary: ?A multichannel digital EEG recording measured by the international 10-20 system with electrodes applied with paste and impedances below 5000 ohms performed in our laboratory with EKG monitoring in an awake patient.  Photic stimulation was performed.  The digital EEG was referentially recorded, reformatted, and digitally filtered in a variety of bipolar and referential montages for optimal display.   ? ?Description: ?The patient is awake during the recording. The background shows diffuse and symmetric theta activity with symmetric, medium voltage 7 Hz posterior dominant rhythm.  Stage 2 sleep was not seen.  Photic stimulation did not elicit any abnormalities.  There were no epileptiform discharges or electrographic seizures seen.   ? ?EKG lead was unremarkable. ? ?Impression: ?This awake EEG is abnormal due to mild generalized background slowing ? ?Clinical Correlation: ?The above finding may be due to a toxic-metabolic encephalopathy or other diffuse physiologic abnormality.  Clinical correlation is advised.   ? ? ?Metta Clines, DO ? ?

## 2022-05-02 ENCOUNTER — Ambulatory Visit: Payer: Medicare PPO | Attending: Primary Care

## 2022-05-02 DIAGNOSIS — R471 Dysarthria and anarthria: Secondary | ICD-10-CM | POA: Insufficient documentation

## 2022-05-02 DIAGNOSIS — R1312 Dysphagia, oropharyngeal phase: Secondary | ICD-10-CM | POA: Insufficient documentation

## 2022-05-02 DIAGNOSIS — R2681 Unsteadiness on feet: Secondary | ICD-10-CM | POA: Diagnosis not present

## 2022-05-02 DIAGNOSIS — Z8673 Personal history of transient ischemic attack (TIA), and cerebral infarction without residual deficits: Secondary | ICD-10-CM | POA: Diagnosis not present

## 2022-05-02 DIAGNOSIS — R2689 Other abnormalities of gait and mobility: Secondary | ICD-10-CM | POA: Insufficient documentation

## 2022-05-02 DIAGNOSIS — R1310 Dysphagia, unspecified: Secondary | ICD-10-CM | POA: Diagnosis not present

## 2022-05-02 DIAGNOSIS — M6281 Muscle weakness (generalized): Secondary | ICD-10-CM | POA: Diagnosis not present

## 2022-05-02 DIAGNOSIS — I6381 Other cerebral infarction due to occlusion or stenosis of small artery: Secondary | ICD-10-CM | POA: Insufficient documentation

## 2022-05-02 NOTE — Therapy (Signed)
?OUTPATIENT PHYSICAL THERAPY NEURO EVALUATION ? ? ?  ?Patient Name: [name] [name] ?MRN: [id number] ?DOB:17-Mar-1941, 81 y.o., male ?Today's Date: 05/02/2022 ? ?PCP: Alma Friendly, NP ?REFERRING PROVIDER: Pleas Koch, NP  ? ? ? 04/30/22 1328  ?PT Visits / Re-Eval  ?Visit Number 1  ?Number of Visits 16  ?Date for PT Re-Evaluation 06/27/22  ?Authorization  ?Progress Note Due on Visit 10  ?PT Time Calculation  ?PT Start Time 1315  ?PT Stop Time 1400  ?PT Time Calculation (min) 45 min  ?PT - End of Session  ?Equipment Utilized During Treatment Gait belt  ?Activity Tolerance Patient tolerated treatment well  ?Behavior During Therapy [facility] for tasks assessed/performed  ? ? ? ?Past Medical History:  ?Diagnosis Date  ? Basal cell carcinoma 11/09/2019  ? nod & infil-behind right ear-cx3 &exc  ? Basal cell carcinoma 03/21/2020  ? Residual BCC with peripheral margin involved - ST recommends MOHs  ? Carotid artery occlusion   ? Coronary artery disease   ? s/p CABG 2005; sees Dr Johnsie Cancel yearly  ? Diabetes mellitus without complication ([place])   ? Gynecomastia   ? Hypercholesterolemia   ? Hypertension   ? Neurodermatitis   ? Overweight(278.02)   ? Personal history of colonic polyps 10/08/2006  ? tubular adenomas  ? Renal insufficiency   ? Stroke [facility])   ? TIA  April 2022  ? Transient ischemic attack 2010  ? "lasted ~ 5 seconds"  ? Vitamin D deficiency   ? ?Past Surgical History:  ?Procedure Laterality Date  ? CARDIAC CATHETERIZATION  02/2004  ? "tried to stent; couldn't"  ? CAROTID ENDARTERECTOMY Right 08/26/2015  ? COLONOSCOPY    ? CORONARY ANGIOPLASTY    ? CORONARY ARTERY BYPASS GRAFT  Feb. 2005  ? 4 vessel  ? ENDARTERECTOMY Right 08/26/2015  ? Procedure: Right Carotid ENDARTERECTOMY with Patch Angioplasty ;  Surgeon: Rosetta Posner, MD;  Location: [place];  Service: Vascular;  Laterality: Right;  ? ENDARTERECTOMY Left 05/18/2021  ? Procedure: LEFT CAROTID ARTERY ENDARTERECTOMY  with patch angioplasty;  Surgeon: Rosetta Posner, MD;   Location: [place];  Service: Vascular;  Laterality: Left;  ? EYE SURGERY    ? bilateral cataract  ? KELOID EXCISION  04/2008  ? on chest scar; Dr. Dessie Coma  ? KELOID EXCISION    ? PILONIDAL CYST EXCISION  1989  ? ?Patient Active Problem List  ? Diagnosis Date Noted  ? Depression 03/22/2022  ? Stroke-like symptoms 03/01/2022  ? History of CVA (cerebrovascular accident) 02/28/2022  ? Aspiration pneumonia vs. CAP 02/28/2022  ? Acute cough 02/26/2022  ? Upper respiratory tract infection 02/26/2022  ? Pain due to onychomycosis of toenails of both feet 02/13/2022  ? Blood clotting disorder ([place]) 02/13/2022  ? History of COVID-19 01/08/2022  ? 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 TIA (transient ischemic attack) 04/03/2021  ? Lower extremity edema 08/13/2019  ? Unsteady gait 06/09/2019  ? Trigger ring finger of right hand 06/02/2018  ? Hyperlipidemia 01/30/2016  ? Carotid stenosis 08/26/2015  ? 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 Carotid artery disease ([place]) 07/01/2012  ? Special screening for malignant neoplasm of prostate 05/08/2011  ? Vitamin D deficiency 11/20/2009  ? Transient cerebral ischemia 06/30/2009  ? CEREBROVASCULAR DISEASE 06/30/2009  ? COLONIC POLYPS 01/10/2008  ? Essential hypertension 01/10/2008  ? Coronary atherosclerosis 01/10/2008  ? 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 GYNECOMASTIA, UNILATERAL 01/10/2008  ? NEURODERMATITIS 01/10/2008  ? 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 SHOULDER PAIN 01/10/2008  ?  Type 2 diabetes mellitus ([place]) 01/10/2008  ? ? ?ONSET DATE: 04/03/22 ? 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 ?THERAPY DIAG:  ?Muscle weakness (generalized) ? ?Other abnormalities of gait and mobility ? ?Unsteadiness on feet ? ?SUBJECTIVE:  ?                                                                                                                                                                                            ? ?SUBJECTIVE STATEMENT: ?Patient was admitted to the hospital on 02/28/2022 for 2-3 days of left leg weakness, left facial weakness and slurred speech.   MRI of brain showed acute infarct within the right corona radiata He was admitted to [facility] from 04/03/2021 to 04/06/2021 for transient left-sided numbness and weakness lasting an hour.  CT head was negative for follow up MRI of brain showed acute small right thalamic infarct with chronic small vessel ischemic changes and multiple chronic lacunar infarcts. Underwent carotid endarterectomy on 05/18/2021 for asymptomatic left ICA stenosis. Pt doesn't think his short term memory is affected but wife reports definite change in his short term memory. ?Pt accompanied by: significant other ? ?PERTINENT HISTORY: HTN, HLD, DMII, CAD, carotid artery disease s/p R CEA and history of 2 strokes and 1 TIA ? ?PAIN:  ?Are you having pain? No ? ?PRECAUTIONS: Fall, Liquids: Honey thick ? ?WEIGHT BEARING RESTRICTIONS No ? ?FALLS: Has patient fallen in last 6 months? No ? ?LIVING ENVIRONMENT: ?Lives with: lives with their spouse ?Lives in: House/apartment ?Stairs: Yes: External: 2 steps; on right going up ?Has following equipment at home: Single point cane ? ?PLOF: Independent ? ?PATIENT GOALS Improve walking ? ?OBJECTIVE:  ? ?DIAGNOSTIC FINDINGS:  ?02/28/22 MRI of head: ?IMPRESSION: ?Acute infarct right corona radiata. ?  ?Atrophy and moderate chronic microvascular ischemic change ?  ?Motion degraded study. ? ?COGNITION: ?Overall cognitive status: Impaired: Memory: Impaired: Working ?Short term ?  ? ? ?BED MOBILITY: I per patient and wife report ? ? ?TRANSFERS: ?Assistive device utilized: Environmental consultant - 2 wheeled  ?Sit to stand: Modified independence ?Stand to sit: Modified independence ?Chair to chair: Modified independence ? ? ? ? ?GAIT: ?Gait pattern: decreased arm swing- Right, decreased arm swing- Left, decreased step length- Right, decreased step length- Left, decreased  stride length, decreased hip/knee flexion- Right, decreased hip/knee flexion- Left, decreased ankle dorsiflexion- Right, decreased ankle dorsiflexion- Left, knee flexed in stance- Right, knee flexed in stance- Left, trunk flexed, poor foot clearance- Right, and poor foot clearance- Left ?Distance walked: 180' ?Assistive device utilized: Environmental consultant - 2 wheeled ?  Level of assistance: Modified independence ? ? ?FUNCTIONAL TESTs:  ?5 times sit to stand: 24.28 sec with bil UE ?Timed up and go (TUG): 21 sec ?10 meter walk test: 0.50 m/s with RW ?Berg Balance Scale: 43/56 moderate fall risk ? ?PATIENT SURVEYS:  ?FOTO 57 ? ?TODAY'S TREATMENT:  ?Evaluation finding reviewed with patient ? ? ?PATIENT EDUCATION: ?Education details: see above ?Person educated: Patient and Spouse ?Education method: Explanation ?Education comprehension: verbalized understanding ? ? ?HOME EXERCISE PROGRAM: ?TBD ? ? ? ?GOALS: ?Goals reviewed with patient? Yes ? ?SHORT TERM GOALS: Target date: 05/30/2022 ? ?Patient will demo 0.77ms walking speed with walker to improve gait speed and community ambulation ?Baseline: 0.50 m/s with walker ?Goal status: INITIAL ? ?2.  Patient will be able to perform sit to stand without HHA to improve functional strength ?Baseline: Requires 1-2 HHA from bed or chair to stand up ?Goal status: INITIAL ? ?3.  Patient will report 50% compliance with HEP to improve self management of symptoms ?Baseline: HEP to be issued 2nd visit ?Goal status: INITIAL ? ? ?LONG TERM GOALS: Target date: 06/27/2022 ? ?Patient will demo gait speed of >0.882m with LRAD to improve community ambulation and decrease fall risk ?Baseline: 0.5 m/s with walker ?Goal status: INITIAL ? ?2.  Pt will demo BBS of >50/56 to reduce fall risk ?Baseline: 42/56 (05/02/22) ?Goal status: INITIAL ? ?3.  Patient will demo TUG <15 seconds with LRAD to improve functional mobility ?Baseline: 21 sec with RW ?Goal status: INITIAL ? ?4. Patient will report 10 points of improvement  on FOTO to improve overall function ?Baseline: 66 (05/02/22) ?Goal status: INITIAL ? ?ASSESSMENT: ? ?CLINICAL IMPRESSION: ?Patient is a [age].o. male who was seen today for physical therapy evaluation and t

## 2022-05-03 ENCOUNTER — Ambulatory Visit (INDEPENDENT_AMBULATORY_CARE_PROVIDER_SITE_OTHER): Payer: Medicare PPO

## 2022-05-03 ENCOUNTER — Ambulatory Visit: Payer: Medicare PPO | Admitting: Neurology

## 2022-05-03 DIAGNOSIS — G459 Transient cerebral ischemic attack, unspecified: Secondary | ICD-10-CM | POA: Diagnosis not present

## 2022-05-03 DIAGNOSIS — I4891 Unspecified atrial fibrillation: Secondary | ICD-10-CM | POA: Diagnosis not present

## 2022-05-04 ENCOUNTER — Other Ambulatory Visit: Payer: Self-pay | Admitting: Primary Care

## 2022-05-04 DIAGNOSIS — R1310 Dysphagia, unspecified: Secondary | ICD-10-CM

## 2022-05-04 DIAGNOSIS — J690 Pneumonitis due to inhalation of food and vomit: Secondary | ICD-10-CM

## 2022-05-04 DIAGNOSIS — Z8673 Personal history of transient ischemic attack (TIA), and cerebral infarction without residual deficits: Secondary | ICD-10-CM

## 2022-05-08 ENCOUNTER — Ambulatory Visit: Payer: Medicare PPO | Admitting: Neurology

## 2022-05-11 ENCOUNTER — Ambulatory Visit: Payer: Medicare PPO | Admitting: Physical Therapy

## 2022-05-11 DIAGNOSIS — R2681 Unsteadiness on feet: Secondary | ICD-10-CM | POA: Diagnosis not present

## 2022-05-11 DIAGNOSIS — R471 Dysarthria and anarthria: Secondary | ICD-10-CM | POA: Diagnosis not present

## 2022-05-11 DIAGNOSIS — I6381 Other cerebral infarction due to occlusion or stenosis of small artery: Secondary | ICD-10-CM | POA: Diagnosis not present

## 2022-05-11 DIAGNOSIS — Z8673 Personal history of transient ischemic attack (TIA), and cerebral infarction without residual deficits: Secondary | ICD-10-CM | POA: Diagnosis not present

## 2022-05-11 DIAGNOSIS — M6281 Muscle weakness (generalized): Secondary | ICD-10-CM | POA: Diagnosis not present

## 2022-05-11 DIAGNOSIS — R1310 Dysphagia, unspecified: Secondary | ICD-10-CM | POA: Diagnosis not present

## 2022-05-11 DIAGNOSIS — R2689 Other abnormalities of gait and mobility: Secondary | ICD-10-CM | POA: Diagnosis not present

## 2022-05-11 DIAGNOSIS — R1312 Dysphagia, oropharyngeal phase: Secondary | ICD-10-CM | POA: Diagnosis not present

## 2022-05-11 NOTE — Therapy (Signed)
?OUTPATIENT PHYSICAL THERAPY TREATMENT NOTE ? ? ?Patient Name: [name] [name] ?MRN: [id number] ?DOB:[birth year], 81 y.o., male ?Today's Date: 05/11/2022 ? ?PCP: Pleas Koch, NP, Josue Hector, MD ?REFERRING PROVIDER: Pleas Koch, NP   ? ?END OF SESSION:  ? PT End of Session - 05/11/22 1323   ? ? Visit Number 2   ? Number of Visits 16   ? Date for PT Re-Evaluation 06/27/22   ? Authorization Type [employer] - will need auth   ? Progress Note Due on Visit 10   ? PT Start Time 1320   ? PT Stop Time 6440   ? PT Time Calculation (min) 44 min   ? Equipment Utilized During Treatment Gait belt   ? Activity Tolerance Patient tolerated treatment well   ? Behavior During Therapy [facility] for tasks assessed/performed   ? ?  ?  ? ?  ? ? ?Past Medical History:  ?Diagnosis Date  ? Basal cell carcinoma 11/09/2019  ? nod & infil-behind right ear-cx3 &exc  ? Basal cell carcinoma 03/21/2020  ? Residual BCC with peripheral margin involved - ST recommends MOHs  ? Carotid artery occlusion   ? Coronary artery disease   ? s/p CABG 2005; sees Dr Johnsie Cancel yearly  ? Diabetes mellitus without complication ([place])   ? Gynecomastia   ? Hypercholesterolemia   ? Hypertension   ? Neurodermatitis   ? Overweight(278.02)   ? Personal history of colonic polyps 10/08/2006  ? tubular adenomas  ? Renal insufficiency   ? Stroke [facility])   ? TIA  April 2022  ? Transient ischemic attack 2010  ? "lasted ~ 5 seconds"  ? Vitamin D deficiency   ? ?Past Surgical History:  ?Procedure Laterality Date  ? CARDIAC CATHETERIZATION  02/2004  ? "tried to stent; couldn't"  ? CAROTID ENDARTERECTOMY Right 08/26/2015  ? COLONOSCOPY    ? CORONARY ANGIOPLASTY    ? CORONARY ARTERY BYPASS GRAFT  Feb. 2005  ? 4 vessel  ? ENDARTERECTOMY Right 08/26/2015  ? Procedure: Right Carotid ENDARTERECTOMY with Patch Angioplasty ;  Surgeon: Rosetta Posner, MD;  Location: [place];  Service: Vascular;  Laterality: Right;  ? ENDARTERECTOMY Left 05/18/2021  ? Procedure: LEFT CAROTID ARTERY  ENDARTERECTOMY  with patch angioplasty;  Surgeon: Rosetta Posner, MD;  Location: [place];  Service: Vascular;  Laterality: Left;  ? EYE SURGERY    ? bilateral cataract  ? KELOID EXCISION  04/2008  ? on chest scar; Dr. Dessie Coma  ? KELOID EXCISION    ? PILONIDAL CYST EXCISION  1989  ? ?Patient Active Problem List  ? Diagnosis Date Noted  ? Depression 03/22/2022  ? Stroke-like symptoms 03/01/2022  ? History of CVA (cerebrovascular accident) 02/28/2022  ? Aspiration pneumonia vs. CAP 02/28/2022  ? Acute cough 02/26/2022  ? Upper respiratory tract infection 02/26/2022  ? Pain due to onychomycosis of toenails of both feet 02/13/2022  ? Blood clotting disorder ([place]) 02/13/2022  ? History of COVID-19 01/08/2022  ? 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 TIA (transient ischemic attack) 04/03/2021  ? Lower extremity edema 08/13/2019  ? Unsteady gait 06/09/2019  ? Trigger ring finger of right hand 06/02/2018  ? Hyperlipidemia 01/30/2016  ? Carotid stenosis 08/26/2015  ? 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 Carotid artery disease ([place]) 07/01/2012  ? Special screening for malignant neoplasm of prostate 05/08/2011  ? Vitamin D deficiency 11/20/2009  ? Transient cerebral ischemia 06/30/2009  ? CEREBROVASCULAR DISEASE 06/30/2009  ? COLONIC POLYPS 01/10/2008  ?  Essential hypertension 01/10/2008  ? Coronary atherosclerosis 01/10/2008  ? 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 GYNECOMASTIA, UNILATERAL 01/10/2008  ? NEURODERMATITIS 01/10/2008  ? 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 SHOULDER PAIN 01/10/2008  ? Type 2 diabetes mellitus ([place]) 01/10/2008  ? ? 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 ?THERAPY DIAG:  ?Other abnormalities of gait and mobility ? ?Unsteadiness on feet ? ?Muscle weakness (generalized) ? ?PERTINENT HISTORY: HTN, HLD, DMII, CAD, carotid artery disease s/p R CEA and history of 2 strokes and 1 TIA  ? ?PRECAUTIONS: Fall, Liquids:  Honey thick, heart monitor  ? ?SUBJECTIVE: Pt inquiring about SLP referral, frustrated w/honey thick liquids. No new changes to report  ? ?PAIN:  ?Are you having pain? No ? ? ?OBJECTIVE: (objective measures completed at initial evaluation unless otherwise dated) ? ?DIAGNOSTIC FINDINGS:  ?02/28/22 MRI of head: ?IMPRESSION: ?Acute infarct right corona radiata. ?  ?Atrophy and moderate chronic microvascular ischemic change ?  ?  ?COGNITION: ?Overall cognitive status: Impaired: Memory: Impaired: Working ?Short term ?         ?  ?TODAY'S TREATMENT:  ?Ther Ex  ?Established and demonstrated initial HEP (see bolded below) for improved single leg stability, transfers and global strength. Pt reports he does not like exercise and has bene performing the same HEP for weeks provided by HHPT (standing hip 4-way, supine heel slides, mini squats) and they are very easy. Educated pt on benefits of high intensity exercise and encouraged pt to begin a walking program, starting at 10 min/day. Pt inquired about use of SPC for mobility, discouraged use for the time being due to decreased step clearance of LLE and poor balance/stamina. Pt verbalized understanding  ? ?Gait pattern: step through pattern, decreased stride length, decreased hip/knee flexion- Left, decreased ankle dorsiflexion- Left, Right foot flat, Left foot flat, poor foot clearance- Right, and poor foot clearance- Left ?Distance walked: Various clinic distances ?Assistive device utilized: Environmental consultant - 2 wheeled ?Level of assistance: SBA ?Comments: Pt demonstrates decreased step clearance of LLE > RLE, but MMT is 4/5. Provided mod verbal cues to increase heel strike and knee flexion w/swing phase for increased step clearance throughout.  ? ?  ?  ?PATIENT EDUCATION: ?Education details: Use of RW for safety, initial HEP, beginning walking program  ?Person educated: Patient and Spouse ?Education method: Explanation and handout  ?Education comprehension: verbalized understanding and  needs further education  ?  ?  ?HOME EXERCISE PROGRAM: ?Access Code: [email or web address] ?URL: [email or web address] ?Date: 05/11/2022 ?Prepared by: Mickie Bail Shalan Neault ? ?Exercises ?- Sit to Stand Without Arm Support  - 1 x daily - 7 x weekly - 3 sets - 5-7 reps ?- Heel Raise on Step  - 1 x daily - 7 x weekly - 3 sets - 10 reps ?- Forward Step Down with Heel Tap and Rail Support  - 1 x daily - 7 x weekly - 3 sets - 10 reps ?  ?  ?  ?GOALS: ?Goals reviewed with patient? Yes ?  ?SHORT TERM GOALS: Target date: 05/30/2022 ?  ?Patient will demo 0.38ms walking speed with walker to improve gait speed and community ambulation ?Baseline: 0.50 m/s with walker ?Goal status: INITIAL ?  ?2.  Patient will be able to perform sit to stand without HHA to improve functional strength ?Baseline: Requires 1-2 HHA from bed or chair to stand up ?Goal status: INITIAL ?  ?3.  Patient will report 50% compliance with HEP to  improve self management of symptoms ?Baseline: HEP to be issued 2nd visit ?Goal status: INITIAL ?  ?  ?LONG TERM GOALS: Target date: 06/27/2022 ?  ?Patient will demo gait speed of >0.39ms with LRAD to improve community ambulation and decrease fall risk ?Baseline: 0.5 m/s with walker ?Goal status: INITIAL ?  ?2.  Pt will demo BBS of >50/56 to reduce fall risk ?Baseline: 42/56 (05/02/22) ?Goal status: INITIAL ?  ?3.  Patient will demo TUG <15 seconds with LRAD to improve functional mobility ?Baseline: 21 sec with RW ?Goal status: INITIAL ?  ?4. Patient will report 10 points of improvement on FOTO to improve overall function ?Baseline: 66 (05/02/22) ?Goal status: INITIAL ?  ?ASSESSMENT: ?  ?CLINICAL IMPRESSION: ?Emphasis of skilled PT session on establishing initial HEP and education on walking program. Per pt's wife, pt does not enjoy exercise and is very difficult to motivate to perform at home. Lengthy discussion regarding benefits of HIIT post-stroke and using pt's goal of ambulating w/SPC as motivation to exercise. Continue  POC.  ?  ?  ?OBJECTIVE IMPAIRMENTS Abnormal gait, decreased activity tolerance, decreased balance, decreased endurance, decreased mobility, difficulty walking, decreased ROM, decreased strength, impaired flexibili

## 2022-05-14 ENCOUNTER — Ambulatory Visit (INDEPENDENT_AMBULATORY_CARE_PROVIDER_SITE_OTHER): Payer: Medicare PPO | Admitting: Podiatry

## 2022-05-14 ENCOUNTER — Encounter: Payer: Self-pay | Admitting: Podiatry

## 2022-05-14 DIAGNOSIS — M79674 Pain in right toe(s): Secondary | ICD-10-CM | POA: Diagnosis not present

## 2022-05-14 DIAGNOSIS — B351 Tinea unguium: Secondary | ICD-10-CM

## 2022-05-14 DIAGNOSIS — D689 Coagulation defect, unspecified: Secondary | ICD-10-CM

## 2022-05-14 DIAGNOSIS — M79675 Pain in left toe(s): Secondary | ICD-10-CM | POA: Diagnosis not present

## 2022-05-14 NOTE — Progress Notes (Signed)
This patient presents  to my office for at risk foot care.  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 Patient has history of CVA. This patient  takes plavix.This patient is unable to cut nails himself since the patient cannot reach his nails.These nails are painful walking and wearing shoes.  This patient presents for at risk foot care today.  General Appearance  Alert, conversant and in no acute stress.  Vascular  Dorsalis pedis and posterior tibial  pulses are weakly  palpable  bilaterally.  Capillary return is within normal limits  bilaterally. Temperature is within normal limits  bilaterally. Swelling persists in feet.   Neurologic  Senn-Weinstein monofilament wire test within normal limits  bilaterally. Muscle power within normal limits bilaterally.  Nails Thick disfigured discolored nails with subungual debris  from hallux to fifth toes bilaterally. No evidence of bacterial infection or drainage bilaterally.  Orthopedic  No limitations of motion  feet .  No crepitus or effusions noted.  No bony pathology or digital deformities noted.  Skin  normotropic skin with no porokeratosis noted bilaterally.  No signs of infections or ulcers noted.     Onychomycosis  Pain in right toes  Pain in left toes  Consent was obtained for treatment procedures.  Mechanical debridement of nails 1-5  bilaterally performed with a nail nipper.  Filed with dremel without incident.    Return office visit    3 months                  Told patient to return for periodic foot care and evaluation due to potential at risk complications.   Unique Searfoss DPM   

## 2022-05-15 ENCOUNTER — Ambulatory Visit: Payer: Medicare PPO | Admitting: Physical Therapy

## 2022-05-15 DIAGNOSIS — R2689 Other abnormalities of gait and mobility: Secondary | ICD-10-CM | POA: Diagnosis not present

## 2022-05-15 DIAGNOSIS — M6281 Muscle weakness (generalized): Secondary | ICD-10-CM | POA: Diagnosis not present

## 2022-05-15 DIAGNOSIS — R1310 Dysphagia, unspecified: Secondary | ICD-10-CM | POA: Diagnosis not present

## 2022-05-15 DIAGNOSIS — R471 Dysarthria and anarthria: Secondary | ICD-10-CM | POA: Diagnosis not present

## 2022-05-15 DIAGNOSIS — R2681 Unsteadiness on feet: Secondary | ICD-10-CM

## 2022-05-15 DIAGNOSIS — Z8673 Personal history of transient ischemic attack (TIA), and cerebral infarction without residual deficits: Secondary | ICD-10-CM | POA: Diagnosis not present

## 2022-05-15 DIAGNOSIS — R1312 Dysphagia, oropharyngeal phase: Secondary | ICD-10-CM | POA: Diagnosis not present

## 2022-05-15 DIAGNOSIS — I6381 Other cerebral infarction due to occlusion or stenosis of small artery: Secondary | ICD-10-CM | POA: Diagnosis not present

## 2022-05-15 NOTE — Therapy (Signed)
?OUTPATIENT PHYSICAL THERAPY TREATMENT NOTE ? ? ?Patient Name: [name] [name] ?MRN: [id number] ?DOB:12/07/1941, 81 y.o., male ?Today's Date: 05/15/2022 ? ?PCP: Pleas Koch, NP, Josue Hector, MD ?REFERRING PROVIDER: Pleas Koch, NP   ? ?END OF SESSION:  ? PT End of Session - 05/15/22 1406   ? ? Visit Number 3   ? Number of Visits 16   ? Date for PT Re-Evaluation 06/27/22   ? Authorization Type [employer] - will need auth   ? Progress Note Due on Visit 10   ? PT Start Time 8032   ? PT Stop Time 1224   ? PT Time Calculation (min) 44 min   ? Equipment Utilized During Treatment --   ? Activity Tolerance Patient tolerated treatment well   ? Behavior During Therapy [facility] for tasks assessed/performed   ? ?  ?  ? ?  ? ? ?Past Medical History:  ?Diagnosis Date  ? Basal cell carcinoma 11/09/2019  ? nod & infil-behind right ear-cx3 &exc  ? Basal cell carcinoma 03/21/2020  ? Residual BCC with peripheral margin involved - ST recommends MOHs  ? Carotid artery occlusion   ? Coronary artery disease   ? s/p CABG 2005; sees Dr Johnsie Cancel yearly  ? Diabetes mellitus without complication ([place])   ? Gynecomastia   ? Hypercholesterolemia   ? Hypertension   ? Neurodermatitis   ? Overweight(278.02)   ? Personal history of colonic polyps 10/08/2006  ? tubular adenomas  ? Renal insufficiency   ? Stroke [facility])   ? TIA  April 2022  ? Transient ischemic attack 2010  ? "lasted ~ 5 seconds"  ? Vitamin D deficiency   ? ?Past Surgical History:  ?Procedure Laterality Date  ? CARDIAC CATHETERIZATION  02/2004  ? "tried to stent; couldn't"  ? CAROTID ENDARTERECTOMY Right 08/26/2015  ? COLONOSCOPY    ? CORONARY ANGIOPLASTY    ? CORONARY ARTERY BYPASS GRAFT  Feb. 2005  ? 4 vessel  ? ENDARTERECTOMY Right 08/26/2015  ? Procedure: Right Carotid ENDARTERECTOMY with Patch Angioplasty ;  Surgeon: Rosetta Posner, MD;  Location: [place];  Service: Vascular;  Laterality: Right;  ? ENDARTERECTOMY Left 05/18/2021  ? Procedure: LEFT CAROTID ARTERY  ENDARTERECTOMY  with patch angioplasty;  Surgeon: Rosetta Posner, MD;  Location: [place];  Service: Vascular;  Laterality: Left;  ? EYE SURGERY    ? bilateral cataract  ? KELOID EXCISION  04/2008  ? on chest scar; Dr. Dessie Coma  ? KELOID EXCISION    ? PILONIDAL CYST EXCISION  1989  ? ?Patient Active Problem List  ? Diagnosis Date Noted  ? Depression 03/22/2022  ? Stroke-like symptoms 03/01/2022  ? History of CVA (cerebrovascular accident) 02/28/2022  ? Aspiration pneumonia vs. CAP 02/28/2022  ? Acute cough 02/26/2022  ? Upper respiratory tract infection 02/26/2022  ? Pain due to onychomycosis of toenails of both feet 02/13/2022  ? Blood clotting disorder ([place]) 02/13/2022  ? History of COVID-19 01/08/2022  ? 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 TIA (transient ischemic attack) 04/03/2021  ? Lower extremity edema 08/13/2019  ? Unsteady gait 06/09/2019  ? Trigger ring finger of right hand 06/02/2018  ? Hyperlipidemia 01/30/2016  ? Carotid stenosis 08/26/2015  ? 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 Carotid artery disease ([place]) 07/01/2012  ? Special screening for malignant neoplasm of prostate 05/08/2011  ? Vitamin D deficiency 11/20/2009  ? Transient cerebral ischemia 06/30/2009  ? CEREBROVASCULAR DISEASE 06/30/2009  ? COLONIC POLYPS 01/10/2008  ?  Essential hypertension 01/10/2008  ? Coronary atherosclerosis 01/10/2008  ? 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 GYNECOMASTIA, UNILATERAL 01/10/2008  ? NEURODERMATITIS 01/10/2008  ? 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 SHOULDER PAIN 01/10/2008  ? Type 2 diabetes mellitus ([place]) 01/10/2008  ? ? 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 ?THERAPY DIAG:  ?Unsteadiness on feet ? ?Other abnormalities of gait and mobility ? ?Muscle weakness (generalized) ? ?PERTINENT HISTORY: HTN, HLD, DMII, CAD, carotid artery disease s/p R CEA and history of 2 strokes and 1 TIA  ? ?PRECAUTIONS: Fall, Liquids:  Honey thick, heart monitor  ? ?SUBJECTIVE: Pt and wife report they did not perform many exercises this weekend due to the holiday. Do not understand the sets/reps of HEP ? ?PAIN:  ?Are you having pain? No ? ? ?OBJECTIVE: (objective measures completed at initial evaluation unless otherwise dated) ? ?DIAGNOSTIC FINDINGS:  ?02/28/22 MRI of head: ?IMPRESSION: ?Acute infarct right corona radiata. ?  ?Atrophy and moderate chronic microvascular ischemic change ?  ?  ?COGNITION: ?Overall cognitive status: Impaired: Memory: Impaired: Working ?Short term ?         ?  ?TODAY'S TREATMENT:  ?Self-care/home management  ?Reviewed HEP and provided definitions of sets and reps. Pt and wife inquired about proper set up, guarding and proper form. Educated pt on purpose of each exercise and proper set up at home.  ? ?Ther Ex  ?SciFit level 5 for 8 minutes using BUE/BLEs for dynamic cardiovascular conditioning and BLE strength. Min cues to maintain steps/min >75 for increased challenge. Noted amplitude of movement decreased if pt began to speak, min cues to sustain attention to task.  ? ?Gait pattern: step through pattern, decreased stride length, decreased hip/knee flexion- Left, decreased ankle dorsiflexion- Left, Right foot flat, Left foot flat, poor foot clearance- Right, and poor foot clearance- Left ?Distance walked: Various clinic distances ?Assistive device utilized: Environmental consultant - 2 wheeled ?Level of assistance: SBA ?Comments: Pt demonstrates decreased step clearance of LLE > RLE. Provided mod verbal cues to increase heel strike and knee flexion w/swing phase for increased step clearance throughout.  ? ?Reviewed and practiced eccentric heel taps from step w/wife guarding as pt unable to remember how to perform safely at home. Pt performed x5 reps per side w/CGA and min cues to maintain DF of moving leg.  ? ?  ?PATIENT EDUCATION: ?Education details: Review of HEP, strong encouragement of walking program, being unsafe to ambulate  outdoors alone.  ?Person educated: Patient and Spouse ?Education method: Explanation  ?Education comprehension: verbalized understanding and needs further education  ?  ?  ?HOME EXERCISE PROGRAM: ?Access Code: [email or web address] ?URL: [email or web address] ?Date: 05/11/2022 ?Prepared by: Mickie Bail Lilienne Weins ? ?Exercises ?- Sit to Stand Without Arm Support  - 1 x daily - 7 x weekly - 3 sets - 5-7 reps ?- Heel Raise on Step  - 1 x daily - 7 x weekly - 3 sets - 10 reps ?- Forward Step Down with Heel Tap and Rail Support  - 1 x daily - 7 x weekly - 3 sets - 10 reps ?  ?  ?  ?GOALS: ?Goals reviewed with patient? Yes ?  ?SHORT TERM GOALS: Target date: 05/30/2022 ?  ?Patient will demo 0.84ms walking speed with walker to improve gait speed and community ambulation ?Baseline: 0.50 m/s with walker ?Goal status: INITIAL ?  ?2.  Patient will be able to perform sit to stand without HHA to improve functional  strength ?Baseline: Requires 1-2 HHA from bed or chair to stand up ?Goal status: INITIAL ?  ?3.  Patient will report 50% compliance with HEP to improve self management of symptoms ?Baseline: HEP to be issued 2nd visit ?Goal status: INITIAL ?  ?  ?LONG TERM GOALS: Target date: 06/27/2022 ?  ?Patient will demo gait speed of >0.13ms with LRAD to improve community ambulation and decrease fall risk ?Baseline: 0.5 m/s with walker ?Goal status: INITIAL ?  ?2.  Pt will demo BBS of >50/56 to reduce fall risk ?Baseline: 42/56 (05/02/22) ?Goal status: INITIAL ?  ?3.  Patient will demo TUG <15 seconds with LRAD to improve functional mobility ?Baseline: 21 sec with RW ?Goal status: INITIAL ?  ?4. Patient will report 10 points of improvement on FOTO to improve overall function ?Baseline: 66 (05/02/22) ?Goal status: INITIAL ?  ?ASSESSMENT: ?  ?CLINICAL IMPRESSION: ?Emphasis of skilled PT session on reviewing HEP for proper performance at home and BLE strength. Pt requires mod-max verbal cues to sustain attention to task, as he becomes easily  distracted w/conversation. Lengthy discussion regarding pt finding motivation to perform exercises and walking program for functional gains. Pt verbalized understanding. Continue POC.  ?  ?  ?OBJECTIVE IMPAIRMENTS Abnormal gait,

## 2022-05-17 ENCOUNTER — Ambulatory Visit: Payer: Medicare PPO | Admitting: Physical Therapy

## 2022-05-17 ENCOUNTER — Encounter: Payer: Self-pay | Admitting: Speech Pathology

## 2022-05-17 ENCOUNTER — Ambulatory Visit: Payer: Medicare PPO | Admitting: Speech Pathology

## 2022-05-17 DIAGNOSIS — R471 Dysarthria and anarthria: Secondary | ICD-10-CM

## 2022-05-17 DIAGNOSIS — R2689 Other abnormalities of gait and mobility: Secondary | ICD-10-CM | POA: Diagnosis not present

## 2022-05-17 DIAGNOSIS — R1312 Dysphagia, oropharyngeal phase: Secondary | ICD-10-CM | POA: Diagnosis not present

## 2022-05-17 DIAGNOSIS — R2681 Unsteadiness on feet: Secondary | ICD-10-CM

## 2022-05-17 DIAGNOSIS — R1310 Dysphagia, unspecified: Secondary | ICD-10-CM | POA: Diagnosis not present

## 2022-05-17 DIAGNOSIS — I6381 Other cerebral infarction due to occlusion or stenosis of small artery: Secondary | ICD-10-CM | POA: Diagnosis not present

## 2022-05-17 DIAGNOSIS — Z8673 Personal history of transient ischemic attack (TIA), and cerebral infarction without residual deficits: Secondary | ICD-10-CM | POA: Diagnosis not present

## 2022-05-17 DIAGNOSIS — M6281 Muscle weakness (generalized): Secondary | ICD-10-CM | POA: Diagnosis not present

## 2022-05-17 NOTE — Patient Instructions (Addendum)
SWALLOWING EXERCISES Effortful Swallows - Squeeze hard with the muscles in your neck while you swallow your  saliva or a sip of water - Complete 20-30 repetitions, 2-3 times a day  Masako Swallow - swallow with your tongue sticking out - Stick tongue out and gently bite tongue with your teeth - Swallow, while holding your tongue with your teeth - Complete 10 repetitions, 2-3 times a day  Shaker Exercise - head lift - Lie flat on your back in your bed or on a couch without pillows - Raise your head and look at your feet  - KEEP YOUR SHOULDERS DOWN - HOLD FOR 60 SECONDS, then lower your head back down - Repeat 4 times, 3 times a day            Hard Swallow Masako Shaker  Thursday PM     Friday AM     Friday PM     Monday AM     Monday PM     Tuesday AM     Tuesday PM

## 2022-05-17 NOTE — Therapy (Signed)
OUTPATIENT PHYSICAL THERAPY TREATMENT NOTE   Patient Name: [name] [name] MRN: [id number] DOB:1941-01-30, 81 y.o., male Today's Date: 05/17/2022  PCP: Pleas Koch, NP, Josue Hector, MD REFERRING PROVIDER: Pleas Koch, NP    END OF SESSION:   PT End of Session - 05/17/22 1324     Visit Number 4    Number of Visits 16    Date for PT Re-Evaluation 06/27/22    Authorization Type [employer] - will need auth    Progress Note Due on Visit 10    PT Start Time 1320    PT Stop Time 1400    PT Time Calculation (min) 40 min    Equipment Utilized During Treatment Gait belt    Activity Tolerance Patient tolerated treatment well    Behavior During Therapy WFL for tasks assessed/performed             Past Medical History:  Diagnosis Date   Basal cell carcinoma 11/09/2019   nod & infil-behind right ear-cx3 &exc   Basal cell carcinoma 03/21/2020   Residual BCC with peripheral margin involved - ST recommends [facility]   Carotid artery occlusion    Coronary artery disease    s/p CABG 2005; sees Dr Johnsie Cancel yearly   Diabetes mellitus without complication ([place])    Gynecomastia    Hypercholesterolemia    Hypertension    Neurodermatitis    Overweight(278.02)    Personal history of colonic polyps 10/08/2006   tubular adenomas   Renal insufficiency    Stroke [facility])    TIA  April 2022   Transient ischemic attack 2010   "lasted ~ 5 seconds"   Vitamin D deficiency    Past Surgical History:  Procedure Laterality Date   CARDIAC CATHETERIZATION  02/2004   "tried to stent; couldn't"   CAROTID ENDARTERECTOMY Right 08/26/2015   COLONOSCOPY     CORONARY ANGIOPLASTY     CORONARY ARTERY BYPASS GRAFT  Feb. 2005   4 vessel   ENDARTERECTOMY Right 08/26/2015   Procedure: Right Carotid ENDARTERECTOMY with Patch Angioplasty ;  Surgeon: Rosetta Posner, MD;  Location: [place];  Service: Vascular;  Laterality: Right;   ENDARTERECTOMY Left 05/18/2021   Procedure: LEFT CAROTID ARTERY  ENDARTERECTOMY  with patch angioplasty;  Surgeon: Rosetta Posner, MD;  Location: [place];  Service: Vascular;  Laterality: Left;   EYE SURGERY     bilateral cataract   KELOID EXCISION  04/2008   on chest scar; Dr. Dessie Coma   KELOID EXCISION     PILONIDAL CYST EXCISION  1989   Patient Active Problem List   Diagnosis Date Noted   Depression 03/22/2022   Stroke-like symptoms 03/01/2022   History of CVA (cerebrovascular accident) 02/28/2022   Aspiration pneumonia vs. 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   Carotid artery disease ([place]) 07/01/2012   Special screening for malignant neoplasm of prostate 05/08/2011   Vitamin D deficiency 11/20/2009   Transient cerebral ischemia 06/30/2009   CEREBROVASCULAR DISEASE 06/30/2009   COLONIC POLYPS 01/10/2008  Essential hypertension 01/10/2008   Coronary atherosclerosis 01/10/2008   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   GYNECOMASTIA, UNILATERAL 01/10/2008   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   PAIN:  Are you having pain? No   OBJECTIVE: (objective measures completed at initial evaluation unless otherwise dated)  DIAGNOSTIC FINDINGS:  02/28/22 MRI of head: IMPRESSION: Acute infarct right corona radiata.   Atrophy and moderate chronic microvascular ischemic change     COGNITION: Overall cognitive status: Impaired: Memory: Impaired: Working Short term            TODAY'S TREATMENT:  Writer pattern: step through pattern, decreased step length- Left, decreased stride length, decreased hip/knee flexion- Left, decreased ankle dorsiflexion- Left, Right foot flat, Left foot flat, shuffling, trunk flexed, poor foot clearance- Right, and poor foot clearance- Left Distance walked: 230' indoors  Assistive device utilized: Environmental consultant - 2 wheeled w/blue theraband tied around front of RW.  Level of assistance: CGA Comments: Tied blue theraband around front of RW as visual cue to maintain walker close to body and to cue for larger step length ("step towards the band"). Pt requires mod-max concurrent verbal cues to maintain proper AD management and adequate step length/clearance. Noted poor AD management on turns, as pt demonstrates picking up RW and placing it outside of his reach to turn. Also noted pt unable to stay in midline in walker and favors L side, occasionally tripping over L leg of walker   Gait pattern:  see above Distance walked: 200' outside on sidewalk  Assistive device utilized: Environmental consultant - 2 wheeled w/blue theraband tied around anterior walker  Level of assistance: CGA and Min A Comments: Mod-max verbal cues for proper AD management and increased step clearance (L > R). Occasional min guard for anterior LOB following getting RW caught in cracks of sidewalk.      PATIENT EDUCATION: Education details: Continuing walking program w/proper AD  management, continued practice of proper sit <>stand form  Person educated: Patient and Spouse Education method: Explanation  Education comprehension: verbalized understanding and needs further education      HOME EXERCISE PROGRAM: Access Code: [email or web address] URL: [email or web address] Date: 05/11/2022 Prepared by: Mickie Bail Lan Mcneill  Exercises - Sit to Stand Without Arm Support  - 1 x daily - 7 x weekly - 3 sets - 5-7 reps - Heel Raise on Step  - 1 x daily - 7 x weekly - 3 sets - 10 reps - Forward Step Down with Heel Tap and Rail Support  - 1 x daily - 7 x weekly - 3 sets - 10 reps       GOALS: Goals reviewed with patient? Yes   SHORT TERM GOALS: Target date: 05/30/2022   Patient will demo 0.64ms walking speed with walker to improve gait speed and community ambulation Baseline: 0.50 m/s with walker Goal status: INITIAL   2.  Patient will be able to perform  sit to stand without HHA to improve functional strength Baseline: Requires 1-2 HHA from bed or chair to stand up Goal status: INITIAL   3.  Patient will report 50% compliance with HEP to improve self management of symptoms Baseline: HEP to be issued 2nd visit Goal status: INITIAL     LONG TERM GOALS: Target date: 06/27/2022   Patient will demo gait speed of >0.55ms with LRAD to improve community ambulation and decrease fall risk Baseline: 0.5 m/s with walker Goal status: INITIAL   2.  Pt will demo BBS of >50/56 to reduce fall risk Baseline: 42/56 (05/02/22) Goal status: INITIAL   3.  Patient will demo TUG <15 seconds with LRAD to improve functional mobility Baseline: 21 sec with RW Goal status: INITIAL   4. Patient will report 10 points of improvement on FOTO to improve overall function Baseline: 66 (05/02/22) Goal status: INITIAL   ASSESSMENT:   CLINICAL IMPRESSION: Emphasis of skilled PT session on gait training for improved step clearance/length and proper AD management. Pt demonstrates poor retention  from prior session and requires mod-max concurrent cues for proper management of AD and increased step length (LLE > RLE). Added blue theraband to RW as visual cue to stay close to walker and to increase step length, which pt able to utilize with reminders. Pt may benefit from trial of rollator on outdoor surfaces due to frequent tripping anteriorly over unlevel pavement w/RW. Continue POC.      OBJECTIVE IMPAIRMENTS Abnormal gait, decreased activity tolerance, decreased balance, decreased endurance, decreased mobility, difficulty walking, decreased ROM, decreased strength, impaired flexibility, improper body mechanics, and postural dysfunction.    ACTIVITY LIMITATIONS cleaning, community activity, and driving.    PERSONAL FACTORS Age, Past/current experiences, Time since onset of injury/illness/exacerbation, and 1-2 comorbidities: [place], HLD, DMII, CAD, carotid artery disease s/p R CEA and history of multiple strokes  are also affecting patient's functional outcome.      REHAB POTENTIAL: Good   CLINICAL DECISION MAKING: Stable/uncomplicated   EVALUATION COMPLEXITY: Low   PLAN: PT FREQUENCY: 2x/week   PT DURATION: 8 weeks   PLANNED INTERVENTIONS: Therapeutic exercises, Therapeutic activity, Neuromuscular re-education, Balance training, Gait training, Patient/Family education, Joint mobilization, Stair training, Orthotic/Fit training, Cryotherapy, Moist heat, and Manual therapy   PLAN FOR NEXT SESSION: How is HEP? Maybe try rollator - pt has significant forward lean w/gait so unsure if good idea. trial foot-up on LLE? L NMR, BLE strength, continued education on proper sit <>stand form and proper AD management as pt has poor retention.    JCruzita LedererPlaster, PT, DPT 05/17/2022, 2:03 PM

## 2022-05-17 NOTE — Therapy (Signed)
OUTPATIENT SPEECH LANGUAGE PATHOLOGY SWALLOW EVALUATION   Patient Name: [name] [name] MRN: [id number] DOB:03/25/1941, 81 y.o., male Today's Date: 05/17/2022  PCP: Pleas Koch, NP  REFERRING PROVIDER: Pleas Koch, NP    End of Session - 05/17/22 1518     Visit Number 1    Number of Visits 52    Date for SLP Re-Evaluation 07/12/22    Authorization Type [employer]    Progress Note Due on Visit 10    SLP Start Time 23    SLP Stop Time  1449    SLP Time Calculation (min) 49 min    Activity Tolerance Patient tolerated treatment well             Past Medical History:  Diagnosis Date   Basal cell carcinoma 11/09/2019   nod & infil-behind right ear-cx3 &exc   Basal cell carcinoma 03/21/2020   Residual BCC with peripheral margin involved - ST recommends [facility]   Carotid artery occlusion    Coronary artery disease    s/p CABG 2005; sees Dr Johnsie Cancel yearly   Diabetes mellitus without complication ([place])    Gynecomastia    Hypercholesterolemia    Hypertension    Neurodermatitis    Overweight(278.02)    Personal history of colonic polyps 10/08/2006   tubular adenomas   Renal insufficiency    Stroke [facility])    TIA  April 2022   Transient ischemic attack 2010   "lasted ~ 5 seconds"   Vitamin D deficiency    Past Surgical History:  Procedure Laterality Date   CARDIAC CATHETERIZATION  02/2004   "tried to stent; couldn't"   CAROTID ENDARTERECTOMY Right 08/26/2015   COLONOSCOPY     CORONARY ANGIOPLASTY     CORONARY ARTERY BYPASS GRAFT  Feb. 2005   4 vessel   ENDARTERECTOMY Right 08/26/2015   Procedure: Right Carotid ENDARTERECTOMY with Patch Angioplasty ;  Surgeon: Rosetta Posner, MD;  Location: [place];  Service: Vascular;  Laterality: Right;   ENDARTERECTOMY Left 05/18/2021   Procedure: LEFT CAROTID ARTERY ENDARTERECTOMY  with patch angioplasty;  Surgeon: Rosetta Posner, MD;  Location: [place];  Service: Vascular;  Laterality: Left;   EYE SURGERY     bilateral  cataract   KELOID EXCISION  04/2008   on chest scar; Dr. Dessie Coma   KELOID EXCISION     PILONIDAL CYST EXCISION  1989   Patient Active Problem List   Diagnosis Date Noted   Depression 03/22/2022   Stroke-like symptoms 03/01/2022   History of CVA (cerebrovascular accident) 02/28/2022   Aspiration pneumonia vs. 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   Carotid artery disease ([place]) 07/01/2012   Special screening for malignant neoplasm of prostate 05/08/2011   Vitamin D deficiency 11/20/2009   Transient cerebral ischemia 06/30/2009   CEREBROVASCULAR DISEASE 06/30/2009   COLONIC POLYPS 01/10/2008   Essential hypertension 01/10/2008   Coronary atherosclerosis 01/10/2008   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   GYNECOMASTIA, UNILATERAL 01/10/2008   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  SHOULDER PAIN 01/10/2008   Type 2 diabetes mellitus ([place]) 01/10/2008    ONSET DATE: 02/28/2022   REFERRING DIAG: N82.95 (ICD-10-CM) - History of CVA (cerebrovascular accident) R13.10 (ICD-10-CM) - Dysphagia, unspecified type J69.0 (ICD-10-CM) - Aspiration pneumonia, unspecified aspiration pneumonia type, unspecified laterality, unspecified part of lung ([place])   THERAPY DIAG:  Dysphagia, oropharyngeal phase  Dysarthria and anarthria  SUBJECTIVE:   SUBJECTIVE STATEMENT: "He's on thickened liquids" wife regarding pt's current diet Pt accompanied by: significant other  PERTINENT HISTORY: [name] [name] is a 81  y.o. male with medical history significant of hx of thalamic stroke in 03/2021, HTN, HLD, T2DM, CAD s/p CABG 2005, carotid artery disease who presented to ED 02/28/2022 with left sided weakness/leg weakness and slurred speech.  Pt coughing and choking with eating that has been worse over the past 2-3 days. MRI ID acute infarct right corona radiata. D/c from hospital 03/04/2022. Received SLP care as inpatient and with home health.   PAIN:  Are you having pain? No  FALLS: Has patient fallen in last 6 months?  No  LIVING ENVIRONMENT: Lives with: lives with their spouse Lives in: House/apartment  PLOF:  Level of assistance: Needed assistance with IADLS Employment: Retired   PATIENT GOALS "drink regular water"  OBJECTIVE:   RECOMMENDATIONS FROM OBJECTIVE SWALLOW STUDY (MBSS/FEES):  regular diet Objective swallow impairments: reduced posterior bolus propulsion, reduced bolus cohesion, and a pharyngeal delay. Penetration (PAS 3, 5) was noted with thin and nectar thick liquids via cup and straw and aspiration (PAS 7, 8) was observed with consecutive swallows of thin liquids, with thin liquids via cup when the pill was taken and with consecutive swallows of nectar thick liquids. Instances of aspiration inconsistently triggered delayed coughing and this was noted most frequently with larger amounts of aspiration. Coughing was ineffective in expelling the aspirate, but cued coughing did expel penetrated material. With thin and nectar thick liquids, penetration and aspiration were eliminated with a chin tuck posture with left head turn and use of individual swallows. Laryngeal invasion was not observed with individual swallows of thin liquids when pt was cued to swallow on the count of three. Laryngeal invasion was also improved to a functional level of (PAS 2) with smaller boluses of thin liquids. No instances of penetration/aspiration were noted with honey thick liquids. Objective recommended compensations:  cup, no straw; meds with puree, small bite/sips, slow rate  COGNITION: Overall cognitive status: Impaired Areas of impairment: Memory, Safety/judgement, and Awareness Comments: No overt change from baseline per spouse report  CLINICAL SWALLOW ASSESSMENT:   Current diet: regular and honey thick liquids Dentition: adequate natural dentition Patient directly observed with POs: Yes: thin liquids  Feeding: able to feed self Liquids provided by: cup and straw Oral phase signs and symptoms: prolonged bolus formation Pharyngeal phase signs and symptoms: suspected delayed swallow initiation, audible swallow, and immediate cough   MOTOR SPEECH: Overall motor speech: impaired Level of impairment: Phrase, Sentence, and Conversation Respiration: thoracic breathing Phonation: normal Resonance: WFL Articulation: Impaired: phrase, sentence, and conversation Intelligibility: Intelligibility reduced Motor planning: Appears intact Motor speech errors: unaware Effective technique: slow rate and over articulate   PATIENT REPORTED OUTCOME MEASURES (PROM): EAT-10: to be administered first therapy session   TODAY'S TREATMENT:  Initiated education on aspiration precautions and swallowing compensations to support safe PO intake at home. Education on rationale for initiation of dysphagia home exercise program to rehabilitate pt's swallow function. Provided demonstration of prescribed exercises. All pt and spouse questions answered at this  time and both verbalized understanding of all presented information.   Therapeutic exercises: Effortful Swallow, Masako Types of cueing: verbal and visual Amount of cueing: moderate Treatment comments: provided visual of exercises to facilitate home practice, including recommend frequency   HOME EXERCISE PROGRAM: Exercises: Effortful Swallow 20-30 repetitions, 3x/day, Masako 10 repetitions, 3x/day, and Shaker 10 second hold x4, 3x/day   PATIENT  EDUCATION: Education details: see above Person educated: Patient and Spouse Education method: Explanation, Demonstration, Verbal cues, and Handouts Education comprehension: verbalized understanding, returned demonstration, and needs further education   ASSESSMENT:  CLINICAL IMPRESSION: Patient is a 81 y.o. M who was seen today for dysphagia and dysarthria. Instrumental swallow study demonstrates penetration and aspiration with thin and nectar thick liquids. Impairments in swallow include reduced posterior bolus propulsion, reduced bolus cohesion, and a pharyngeal delay. Chin tuck and head turn to left reduce laryngeal penetration, however pt with difficulty recalling to follow this protocol. Wife able to teach back compensations and strategies recommended by home health SLP: do no mix liquid with food, small bites/sips, not using meltable ice, avoiding certain foods, oral care. Wife, [name], reports that she has to consistently remind pt to follow precautions and compensations. SLP advises using visual aids to support recall. Pt agreeable to therapy course to target pharyngeal strengthening and expiratory muscle strength in effort to improve swallow function and safety. Additionally during evaluation, pt presents with motor speech impairment, characterized by imprecise articulation which reduces overall intelligibility to 85% in this quiet environment. I recommend skilled ST to address dysphagia and dysarthria.     OBJECTIVE IMPAIRMENTS include dysarthria and dysphagia. These impairments are limiting patient from safety when swallowing and effectively communicating at home and in community. Factors affecting potential to achieve goals and functional outcome are ability to learn/carryover information and previous level of function. Patient will benefit from skilled SLP services to address above impairments and improve overall function.  REHAB POTENTIAL: Fair     GOALS: Goals reviewed with patient?  Yes  SHORT TERM GOALS: Target date: 06/14/2022    Pt and wife will report compliance with HEP frequency and repetitions with mod-I over 2 sessions. Baseline: Goal status: INITIAL  2.  Pt and wife will report implementation of visual aids at home to support pt recall of targeted swallowing compensations and strategies over 2 sessions.  Baseline:  Goal status: INITIAL  3.  Pt will teach back prescribed dysphagia exercises with rare min-A over 2 sessions Baseline:  Goal status: INITIAL   LONG TERM GOALS: Target date: 07/12/2022  Pt and spouse will report ongoing compliance with prescribed HEP with mod-I over 1 week period Baseline:  Goal status: INITIAL  2.  Using visual aids, pt with comply with targeted swallow precautions and compensations with no more than 2 verbal cues over 10 minute period.  Baseline:  Goal status: INITIAL  3.  Pt will improve score on EAT-10 PROM by d/c, indicating subjective improvement in swallow function Baseline:  Goal status: INITIAL  4.  Pt will participate in follow up MBSS following therapy course to objectively assess improvement in swallow function and safety.  Baseline:  Goal status: INITIAL   PLAN: SLP FREQUENCY: 2x/week  SLP DURATION: 8 weeks  PLANNED INTERVENTIONS: Aspiration precaution training, Pharyngeal strengthening exercises, Diet toleration management , Trials of upgraded texture/liquids, SLP instruction and feedback, Compensatory strategies, Patient/family education, and Re-evaluation    Su Monks, [place] 05/17/2022, 3:18 PM

## 2022-05-21 ENCOUNTER — Ambulatory Visit: Payer: Medicare PPO | Admitting: Neurology

## 2022-05-23 ENCOUNTER — Ambulatory Visit: Payer: Medicare PPO | Admitting: Speech Pathology

## 2022-05-23 ENCOUNTER — Ambulatory Visit: Payer: Medicare PPO

## 2022-05-23 DIAGNOSIS — R2689 Other abnormalities of gait and mobility: Secondary | ICD-10-CM

## 2022-05-23 DIAGNOSIS — R1312 Dysphagia, oropharyngeal phase: Secondary | ICD-10-CM | POA: Diagnosis not present

## 2022-05-23 DIAGNOSIS — R2681 Unsteadiness on feet: Secondary | ICD-10-CM

## 2022-05-23 DIAGNOSIS — M6281 Muscle weakness (generalized): Secondary | ICD-10-CM | POA: Diagnosis not present

## 2022-05-23 DIAGNOSIS — Z8673 Personal history of transient ischemic attack (TIA), and cerebral infarction without residual deficits: Secondary | ICD-10-CM | POA: Diagnosis not present

## 2022-05-23 DIAGNOSIS — R1310 Dysphagia, unspecified: Secondary | ICD-10-CM | POA: Diagnosis not present

## 2022-05-23 DIAGNOSIS — I6381 Other cerebral infarction due to occlusion or stenosis of small artery: Secondary | ICD-10-CM | POA: Diagnosis not present

## 2022-05-23 DIAGNOSIS — R471 Dysarthria and anarthria: Secondary | ICD-10-CM | POA: Diagnosis not present

## 2022-05-23 NOTE — Patient Instructions (Addendum)
Hard Swallow x20-30 Tongue Out x10 Shaker 10 sec hold x4 EMST  Wednesday AM    [facility]  Wednesday PM    [facility]  Thursday AM    [id number]  Thursday PM      Friday AM      Friday PM      Saturday AM      Saturday PM      Sunday AM      Sunday PM      Monday AM      Monday PM      Tuesday AM      Tuesday PM

## 2022-05-23 NOTE — Therapy (Signed)
OUTPATIENT PHYSICAL THERAPY TREATMENT NOTE   Patient Name: [name] [name] MRN: [id number] DOB:[birth year], 81 y.o., male Today's Date: 05/23/2022  PCP: Pleas Koch, NP, Josue Hector, MD REFERRING PROVIDER: Pleas Koch, NP    END OF SESSION:   PT End of Session - 05/23/22 0850     Visit Number 5    Number of Visits 16    Date for PT Re-Evaluation 06/27/22    Authorization Type [employer] - will need auth    Progress Note Due on Visit 10    PT Start Time 0845    PT Stop Time 0930    PT Time Calculation (min) 45 min    Equipment Utilized During Treatment Gait belt    Activity Tolerance Patient tolerated treatment well    Behavior During Therapy [facility] for tasks assessed/performed             Past Medical History:  Diagnosis Date   Basal cell carcinoma 11/09/2019   nod & infil-behind right ear-cx3 &exc   Basal cell carcinoma 03/21/2020   Residual BCC with peripheral margin involved - ST recommends [facility]   Carotid artery occlusion    Coronary artery disease    s/p CABG 2005; sees Dr Johnsie Cancel yearly   Diabetes mellitus without complication ([place])    Gynecomastia    Hypercholesterolemia    Hypertension    Neurodermatitis    Overweight(278.02)    Personal history of colonic polyps 10/08/2006   tubular adenomas   Renal insufficiency    Stroke [facility])    TIA  April 2022   Transient ischemic attack 2010   "lasted ~ 5 seconds"   Vitamin D deficiency    Past Surgical History:  Procedure Laterality Date   CARDIAC CATHETERIZATION  02/2004   "tried to stent; couldn't"   CAROTID ENDARTERECTOMY Right 08/26/2015   COLONOSCOPY     CORONARY ANGIOPLASTY     CORONARY ARTERY BYPASS GRAFT  Feb. 2005   4 vessel   ENDARTERECTOMY Right 08/26/2015   Procedure: Right Carotid ENDARTERECTOMY with Patch Angioplasty ;  Surgeon: Rosetta Posner, MD;  Location: [place];  Service: Vascular;  Laterality: Right;   ENDARTERECTOMY Left 05/18/2021   Procedure: LEFT CAROTID ARTERY  ENDARTERECTOMY  with patch angioplasty;  Surgeon: Rosetta Posner, MD;  Location: [place];  Service: Vascular;  Laterality: Left;   EYE SURGERY     bilateral cataract   KELOID EXCISION  04/2008   on chest scar; Dr. Dessie Coma   KELOID EXCISION     PILONIDAL CYST EXCISION  1989   Patient Active Problem List   Diagnosis Date Noted   Depression 03/22/2022   Stroke-like symptoms 03/01/2022   History of CVA (cerebrovascular accident) 02/28/2022   Aspiration pneumonia vs. 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   TIA (transient ischemic attack) 04/03/2021   Lower extremity edema 08/13/2019   Unsteady gait 06/09/2019   Trigger ring finger of right hand 06/02/2018   Hyperlipidemia 01/30/2016   Carotid stenosis 08/26/2015   Anxiety, 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   Carotid artery disease ([place]) 07/01/2012   Special screening for malignant neoplasm of prostate 05/08/2011   Vitamin D deficiency 11/20/2009   Transient cerebral ischemia 06/30/2009   CEREBROVASCULAR DISEASE 06/30/2009   COLONIC POLYPS 01/10/2008  Essential hypertension 01/10/2008   Coronary atherosclerosis 01/10/2008   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   GYNECOMASTIA, UNILATERAL 01/10/2008   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   SHOULDER PAIN 01/10/2008   Type 2 diabetes mellitus ([place]) 01/10/2008    REFERRING DIAG:  L27.51 (ICD-10-CM) - History of CVA (cerebrovascular accident) I63.81 (ICD-10-CM) - 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 I had to call my wife who couldn't stand me up. Finally I decided to crawl to bedroom and I used chair to get up. My left arm and Left leg is sore.  PAIN:  Are you having pain? No   OBJECTIVE: (objective measures completed at initial evaluation unless otherwise dated)  DIAGNOSTIC FINDINGS:  02/28/22 MRI of head: IMPRESSION: Acute infarct right corona radiata.   Atrophy and moderate chronic microvascular ischemic change     COGNITION: Overall cognitive status: Impaired: Memory: Impaired: Working Short term            TODAY'S TREATMENT:  Step tap: 3rd step, bil hand rail, pt asked to do fast taps with ipsilateral foot: 2 x 10 R and L Gait training: 2 x 230' with RW, cues to pick up the left leg throughout the session, pt educated on being focused on left leg, given cues to get foot to the theraband in front of him for visual feedback Standing box taps: 6" box without HHA, pt given cues to do closed chain hip abduction on contralateral hip by raising ipsilateral pelvis with alternating taps: 3 x 10 R and L Standing pushing 6' box (heavy one) forward about 20 feet with alteranting feet to work on closed chain glut activation: no HHA    PATIENT EDUCATION: Education details: Continuing walking program w/proper AD management, continued practice of proper sit <>stand form  Person educated: Patient and Spouse Education method: Explanation  Education comprehension: verbalized understanding and needs further education      HOME EXERCISE PROGRAM: Access Code: [email or web address] URL: [email or web address] Date: 05/11/2022 Prepared by: Mickie Bail Plaster  Exercises - Sit to Stand Without Arm Support  - 1 x daily - 7 x weekly - 3 sets - 5-7 reps - Heel Raise on Step  - 1 x daily - 7 x weekly - 3 sets - 10 reps - Forward Step Down with  Heel Tap and Rail Support  - 1 x daily - 7 x weekly - 3 sets - 10 reps       GOALS: Goals reviewed with patient? Yes   SHORT TERM GOALS: Target date: 05/30/2022   Patient will demo 0.24ms walking speed with walker to improve gait speed and community ambulation Baseline: 0.50 m/s with walker Goal status: INITIAL   2.  Patient will be able to perform sit to stand without HHA to improve functional strength Baseline: Requires 1-2 HHA from bed or chair to stand up Goal status: INITIAL   3.  Patient will report 50% compliance with HEP to improve self management of symptoms Baseline: HEP to be issued 2nd visit Goal status: INITIAL     LONG TERM GOALS: Target date: 06/27/2022  Patient will demo gait speed of >0.33ms with LRAD to improve community ambulation and decrease fall risk Baseline: 0.5 m/s with walker Goal status: INITIAL   2.  Pt will demo BBS of >50/56 to reduce fall risk Baseline: 42/56 (05/02/22) Goal status: INITIAL   3.  Patient will demo TUG <15 seconds with LRAD to improve functional mobility Baseline: 21 sec with RW Goal status: INITIAL   4. Patient will report 10 points of improvement on FOTO to improve overall function Baseline: 66 (05/02/22) Goal status: INITIAL   ASSESSMENT:   CLINICAL IMPRESSION: Pt has adequate strength in L ankle dorsiflexors. Pt's L foot drag is significant during gait which may be due to R glut med weakness dur to trendelenberg during gait. We emphasized working on closed chain glut med activation during today' session. Pt and wife educated on working on "lifting left hip" during L swing phase to improve foot clerance on L leg.     OBJECTIVE IMPAIRMENTS Abnormal gait, decreased activity tolerance, decreased balance, decreased endurance, decreased mobility, difficulty walking, decreased ROM, decreased strength, impaired flexibility, improper body mechanics, and postural dysfunction.    ACTIVITY LIMITATIONS cleaning, community activity, and  driving.    PERSONAL FACTORS Age, Past/current experiences, Time since onset of injury/illness/exacerbation, and 1-2 comorbidities: TN, HLD, DMII, CAD, carotid artery disease s/p R CEA and history of multiple strokes  are also affecting patient's functional outcome.      REHAB POTENTIAL: Good   CLINICAL DECISION MAKING: Stable/uncomplicated   EVALUATION COMPLEXITY: Low   PLAN: PT FREQUENCY: 2x/week   PT DURATION: 8 weeks   PLANNED INTERVENTIONS: Therapeutic exercises, Therapeutic activity, Neuromuscular re-education, Balance training, Gait training, Patient/Family education, Joint mobilization, Stair training, Orthotic/Fit training, Cryotherapy, Moist heat, and Manual therapy   PLAN FOR NEXT SESSION: HEP is going good. Work on closed [employer] activation ( R weaker compared to Left) .   KKerrie Pleasure PT 05/23/2022, 9:36 AM

## 2022-05-23 NOTE — Therapy (Signed)
OUTPATIENT SPEECH LANGUAGE PATHOLOGY DYSPHAGIA TREATMENT NOTE   Patient Name: [name] [name] MRN: [id number] DOB:1941/07/03, 81 y.o., male Today's Date: 05/23/2022  PCP: Pleas Koch, NP  REFERRING PROVIDER: Pleas Koch, NP   END OF SESSION:   End of Session - 05/23/22 0926     Visit Number 2    Number of Visits 17    Date for SLP Re-Evaluation 07/12/22    Authorization Type [employer]    Progress Note Due on Visit 10    SLP Start Time 0930    SLP Stop Time  1005    SLP Time Calculation (min) 35 min    Activity Tolerance Patient tolerated treatment well             Past Medical History:  Diagnosis Date   Basal cell carcinoma 11/09/2019   nod & infil-behind right ear-cx3 &exc   Basal cell carcinoma 03/21/2020   Residual BCC with peripheral margin involved - ST recommends [facility]   Carotid artery occlusion    Coronary artery disease    s/p CABG 2005; sees Dr Johnsie Cancel yearly   Diabetes mellitus without complication ([place])    Gynecomastia    Hypercholesterolemia    Hypertension    Neurodermatitis    Overweight(278.02)    Personal history of colonic polyps 10/08/2006   tubular adenomas   Renal insufficiency    Stroke [facility])    TIA  April 2022   Transient ischemic attack 2010   "lasted ~ 5 seconds"   Vitamin D deficiency    Past Surgical History:  Procedure Laterality Date   CARDIAC CATHETERIZATION  02/2004   "tried to stent; couldn't"   CAROTID ENDARTERECTOMY Right 08/26/2015   COLONOSCOPY     CORONARY ANGIOPLASTY     CORONARY ARTERY BYPASS GRAFT  Feb. 2005   4 vessel   ENDARTERECTOMY Right 08/26/2015   Procedure: Right Carotid ENDARTERECTOMY with Patch Angioplasty ;  Surgeon: Rosetta Posner, MD;  Location: [place];  Service: Vascular;  Laterality: Right;   ENDARTERECTOMY Left 05/18/2021   Procedure: LEFT CAROTID ARTERY ENDARTERECTOMY  with patch angioplasty;  Surgeon: Rosetta Posner, MD;  Location: [place];  Service: Vascular;  Laterality: Left;   EYE  SURGERY     bilateral cataract   KELOID EXCISION  04/2008   on chest scar; Dr. Dessie Coma   KELOID EXCISION     PILONIDAL CYST EXCISION  1989   Patient Active Problem List   Diagnosis Date Noted   Depression 03/22/2022   Stroke-like symptoms 03/01/2022   History of CVA (cerebrovascular accident) 02/28/2022   Aspiration pneumonia vs. 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   Carotid artery disease ([place]) 07/01/2012   Special screening for malignant neoplasm of prostate 05/08/2011   Vitamin D deficiency 11/20/2009   Transient cerebral ischemia 06/30/2009   CEREBROVASCULAR DISEASE 06/30/2009   COLONIC POLYPS 01/10/2008   Essential hypertension 01/10/2008   Coronary atherosclerosis 01/10/2008   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   GYNECOMASTIA, UNILATERAL 01/10/2008   NEURODERMATITIS 01/10/2008  KELOID 01/10/2008   SHOULDER PAIN 01/10/2008   Type 2 diabetes mellitus ([place]) 01/10/2008    ONSET DATE: 02/28/2022    REFERRING DIAG: Z61.09 (ICD-10-CM) - History of CVA (cerebrovascular accident) R13.10 (ICD-10-CM) - Dysphagia, unspecified type J69.0 (ICD-10-CM) - Aspiration pneumonia, unspecified aspiration pneumonia type, unspecified laterality, unspecified part of lung ([place])   THERAPY DIAG:  Dysphagia, oropharyngeal phase  SUBJECTIVE:   SUBJECTIVE STATEMENT: "been doing it everyday" re: HEP  PAIN:  Are you having pain? No  OBJECTIVE:   RECOMMENDATIONS FROM OBJECTIVE SWALLOW STUDY (MBSS/FEES):   regular diet Objective swallow impairments: reduced posterior bolus propulsion, reduced bolus cohesion, and a pharyngeal delay. Penetration (PAS 3, 5) was noted with thin and nectar thick liquids via cup and straw and aspiration (PAS 7, 8) was observed with consecutive swallows of thin liquids, with thin liquids via cup when the pill was taken and with consecutive swallows of nectar thick liquids. Instances of aspiration inconsistently triggered delayed coughing and this was noted most frequently with larger amounts of aspiration. Coughing was ineffective in expelling the aspirate, but cued coughing did expel penetrated material. With thin and nectar thick liquids, penetration and aspiration were eliminated with a chin tuck posture with left head turn and use of individual swallows. Laryngeal invasion was not observed with individual swallows of thin liquids when pt was cued to swallow on the count of three. Laryngeal invasion was also improved to a functional level of (PAS 2) with smaller boluses of thin liquids. No instances of penetration/aspiration were noted with honey thick liquids. Objective recommended compensations: cup, no straw; meds with puree, small bite/sips, slow rate  TODAY'S TREATMENT:   DYSPHAGIA TREATMENT:   Current diet: regular and honey thick liquids Swallow precautions: small bites, small sips, alternate bites and sips, chin tuck, head turn, stay upright 30 minutes post meal, hard swallow, no straws, and avoid mixed consistencies Patient directly observed with POs: Yes: honey thick liquids  Feeding: able to feed self Liquids provided by: teaspoon Oral phase signs and symptoms:  n/a Pharyngeal phase signs and symptoms: suspected delayed swallow initiation, immediate cough, and delayed cough Therapeutic exercises: Effortful Swallow, Masako, and EMST Types of cueing: verbal Amount of cueing: moderate Treatment comments:  Pt able to teach about Masako and Shaker exercises. Mod-A  to teach back effortful swallow. Reports high compliance with HEP, returns with chart documenting days exercises completed. Answered questions spouse and pt have about exercise protocol and swallow precautions. Lead pt through completion of AM round of Masako and Effortful Swallow exercises.     HOME EXERCISE PROGRAM: Exercises: Effortful Swallow 20-30 repetitions, 3 x/day, Masako 10 repetitions, 3 x/day, and Shaker 10 repetitions, 3 x/day  PATIENT EDUCATION: Education details: see above Person educated: Patient and Spouse Education method: Explanation, Demonstration, Verbal cues, and Handouts Education comprehension: verbalized understanding, returned demonstration, and needs further education     ASSESSMENT:   CLINICAL IMPRESSION: Patient is a 81 y.o. M who was seen today for dysphagia and dysarthria. Instrumental swallow study demonstrates penetration and aspiration with thin and nectar thick liquids. Impairments in swallow include reduced posterior bolus propulsion, reduced bolus cohesion, and a pharyngeal delay. Chin tuck and head turn to left reduce laryngeal penetration, however pt with difficulty recalling to follow this protocol. Wife able to teach back compensations and strategies recommended by home health SLP: do no mix liquid with food, small bites/sips, not using meltable ice, avoiding certain foods, oral care. Wife, [name], reports that she has to consistently remind pt to  follow precautions and compensations. SLP advises using visual aids to support recall. Pt agreeable to therapy course to target pharyngeal strengthening and expiratory muscle strength in effort to improve swallow function and safety. Additionally during evaluation, pt presents with motor speech impairment, characterized by imprecise articulation which reduces overall intelligibility to 85% in this quiet environment. I recommend skilled ST to address dysphagia and dysarthria.      OBJECTIVE IMPAIRMENTS include  dysarthria and dysphagia. These impairments are limiting patient from safety when swallowing and effectively communicating at home and in community. Factors affecting potential to achieve goals and functional outcome are ability to learn/carryover information and previous level of function. Patient will benefit from skilled SLP services to address above impairments and improve overall function.   REHAB POTENTIAL: Fair       GOALS: Goals reviewed with patient? Yes   SHORT TERM GOALS: Target date: 06/14/2022     Pt and wife will report compliance with HEP frequency and repetitions with mod-I over 2 sessions. Baseline: Goal status: ongoing   2.  Pt and wife will report implementation of visual aids at home to support pt recall of targeted swallowing compensations and strategies over 2 sessions.  Baseline:  Goal status: ongoing   3.  Pt will teach back prescribed dysphagia exercises with rare min-A over 2 sessions Baseline:  Goal status: ongoing     LONG TERM GOALS: Target date: 07/12/2022   Pt and spouse will report ongoing compliance with prescribed HEP with mod-I over 1 week period Baseline:  Goal status: ongoing   2.  Using visual aids, pt with comply with targeted swallow precautions and compensations with no more than 2 verbal cues over 10 minute period.  Baseline:  Goal status: ongoing   3.  Pt will improve score on EAT-10 PROM by d/c, indicating subjective improvement in swallow function Baseline:  Goal status: ongoing   4.  Pt will participate in follow up MBSS following therapy course to objectively assess improvement in swallow function and safety.  Baseline:  Goal status: ongoing     PLAN: SLP FREQUENCY: 2x/week   SLP DURATION: 8 weeks   PLANNED INTERVENTIONS: Aspiration precaution training, Pharyngeal strengthening exercises, Diet toleration management , Trials of upgraded texture/liquids, SLP instruction and feedback, Compensatory strategies, Patient/family  education, and Re-evaluation   Su Monks, [place] 05/23/2022, 9:34 AM

## 2022-05-25 ENCOUNTER — Ambulatory Visit: Payer: Medicare PPO

## 2022-05-25 ENCOUNTER — Ambulatory Visit: Payer: Medicare PPO | Admitting: Speech Pathology

## 2022-05-25 DIAGNOSIS — R1312 Dysphagia, oropharyngeal phase: Secondary | ICD-10-CM

## 2022-05-25 DIAGNOSIS — I6381 Other cerebral infarction due to occlusion or stenosis of small artery: Secondary | ICD-10-CM | POA: Diagnosis not present

## 2022-05-25 DIAGNOSIS — R471 Dysarthria and anarthria: Secondary | ICD-10-CM | POA: Diagnosis not present

## 2022-05-25 DIAGNOSIS — R2689 Other abnormalities of gait and mobility: Secondary | ICD-10-CM | POA: Diagnosis not present

## 2022-05-25 DIAGNOSIS — M6281 Muscle weakness (generalized): Secondary | ICD-10-CM

## 2022-05-25 DIAGNOSIS — Z8673 Personal history of transient ischemic attack (TIA), and cerebral infarction without residual deficits: Secondary | ICD-10-CM | POA: Diagnosis not present

## 2022-05-25 DIAGNOSIS — R2681 Unsteadiness on feet: Secondary | ICD-10-CM

## 2022-05-25 DIAGNOSIS — R1310 Dysphagia, unspecified: Secondary | ICD-10-CM | POA: Diagnosis not present

## 2022-05-25 NOTE — Therapy (Signed)
OUTPATIENT SPEECH LANGUAGE PATHOLOGY DYSPHAGIA TREATMENT NOTE   Patient Name: [name] [name] MRN: [id number] DOB:May 18, 1941, 81 y.o., male Today's Date: 05/25/2022  PCP: Pleas Koch, NP  REFERRING PROVIDER: Pleas Koch, NP   END OF SESSION:   End of Session - 05/25/22 1024     Visit Number 3    Number of Visits 17    Date for SLP Re-Evaluation 07/12/22    Authorization Type [employer]    Progress Note Due on Visit 10    SLP Start Time 1025    SLP Stop Time  1056    SLP Time Calculation (min) 31 min    Activity Tolerance Patient tolerated treatment well              Past Medical History:  Diagnosis Date   Basal cell carcinoma 11/09/2019   nod & infil-behind right ear-cx3 &exc   Basal cell carcinoma 03/21/2020   Residual BCC with peripheral margin involved - ST recommends [facility]   Carotid artery occlusion    Coronary artery disease    s/p CABG 2005; sees Dr Johnsie Cancel yearly   Diabetes mellitus without complication ([place])    Gynecomastia    Hypercholesterolemia    Hypertension    Neurodermatitis    Overweight(278.02)    Personal history of colonic polyps 10/08/2006   tubular adenomas   Renal insufficiency    Stroke [facility])    TIA  April 2022   Transient ischemic attack 2010   "lasted ~ 5 seconds"   Vitamin D deficiency    Past Surgical History:  Procedure Laterality Date   CARDIAC CATHETERIZATION  02/2004   "tried to stent; couldn't"   CAROTID ENDARTERECTOMY Right 08/26/2015   COLONOSCOPY     CORONARY ANGIOPLASTY     CORONARY ARTERY BYPASS GRAFT  Feb. 2005   4 vessel   ENDARTERECTOMY Right 08/26/2015   Procedure: Right Carotid ENDARTERECTOMY with Patch Angioplasty ;  Surgeon: Rosetta Posner, MD;  Location: [place];  Service: Vascular;  Laterality: Right;   ENDARTERECTOMY Left 05/18/2021   Procedure: LEFT CAROTID ARTERY ENDARTERECTOMY  with patch angioplasty;  Surgeon: Rosetta Posner, MD;  Location: [place];  Service: Vascular;  Laterality: Left;   EYE  SURGERY     bilateral cataract   KELOID EXCISION  04/2008   on chest scar; Dr. Dessie Coma   KELOID EXCISION     PILONIDAL CYST EXCISION  1989   Patient Active Problem List   Diagnosis Date Noted   Depression 03/22/2022   Stroke-like symptoms 03/01/2022   History of CVA (cerebrovascular accident) 02/28/2022   Aspiration pneumonia vs. 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   TIA (transient ischemic attack) 04/03/2021   Lower extremity edema 08/13/2019   Unsteady gait 06/09/2019   Trigger ring finger of right hand 06/02/2018   Hyperlipidemia 01/30/2016   Carotid stenosis 08/26/2015   Anxiety, 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   Carotid artery disease ([place]) 07/01/2012   Special screening for malignant neoplasm of prostate 05/08/2011   Vitamin D deficiency 11/20/2009   Transient cerebral ischemia 06/30/2009   CEREBROVASCULAR DISEASE 06/30/2009   COLONIC POLYPS 01/10/2008   Essential hypertension 01/10/2008   Coronary atherosclerosis 01/10/2008   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   GYNECOMASTIA, UNILATERAL 01/10/2008   NEURODERMATITIS  01/10/2008   KELOID 01/10/2008   SHOULDER PAIN 01/10/2008   Type 2 diabetes mellitus ([place]) 01/10/2008    ONSET DATE: 02/28/2022    REFERRING DIAG: P61.95 (ICD-10-CM) - History of CVA (cerebrovascular accident) R13.10 (ICD-10-CM) - Dysphagia, unspecified type J69.0 (ICD-10-CM) - Aspiration pneumonia, unspecified aspiration pneumonia type, unspecified laterality, unspecified part of lung ([place])   THERAPY DIAG:  Dysphagia, oropharyngeal phase  SUBJECTIVE:   SUBJECTIVE STATEMENT: "great"  PAIN:  Are you having pain? No  OBJECTIVE:   RECOMMENDATIONS FROM OBJECTIVE SWALLOW STUDY (MBSS/FEES):  regular diet Objective  swallow impairments: reduced posterior bolus propulsion, reduced bolus cohesion, and a pharyngeal delay. Penetration (PAS 3, 5) was noted with thin and nectar thick liquids via cup and straw and aspiration (PAS 7, 8) was observed with consecutive swallows of thin liquids, with thin liquids via cup when the pill was taken and with consecutive swallows of nectar thick liquids. Instances of aspiration inconsistently triggered delayed coughing and this was noted most frequently with larger amounts of aspiration. Coughing was ineffective in expelling the aspirate, but cued coughing did expel penetrated material. With thin and nectar thick liquids, penetration and aspiration were eliminated with a chin tuck posture with left head turn and use of individual swallows. Laryngeal invasion was not observed with individual swallows of thin liquids when pt was cued to swallow on the count of three. Laryngeal invasion was also improved to a functional level of (PAS 2) with smaller boluses of thin liquids. No instances of penetration/aspiration were noted with honey thick liquids. Objective recommended compensations: cup, no straw; meds with puree, small bite/sips, slow rate  TODAY'S TREATMENT:   DYSPHAGIA TREATMENT:   Current diet: regular and honey thick liquids Swallow precautions: small bites, small sips, alternate bites and sips, chin tuck, head turn, stay upright 30 minutes post meal, hard swallow, no straws, and avoid mixed consistencies Patient directly observed with POs: Yes: honey thick liquids  Feeding: able to feed self Liquids provided by: straw Oral phase signs and symptoms:  n/a Pharyngeal phase signs and symptoms: suspected delayed swallow initiation, immediate cough, and delayed cough Therapeutic exercises: EMST Types of cueing: verbal Amount of cueing: moderate Treatment comments:  SLP conducted assessment of MEP for EMST. Pt's maximum expiratory pressure today was 88 cm H2O. EMST 150 device was  started at 75% MEP at 66 cm H2O. SLP provided demonstration and verbal instruction to utilize EMST device. During trials, appears too difficult for pt, so setting was decreased to 60 cm H2O. Pt rates effort as 6-7/10. Pt completed 20 repetitions over 2 sets during our session. HEP initiated for 5 sets of 5 reps for 5 days/week.     HOME EXERCISE PROGRAM: Exercises: Effortful Swallow 20-30 repetitions, 3 x/day, Masako 10 repetitions, 3 x/day, Shaker 10 repetitions, 3 x/day, and EMST 10 repetitions, 2 x/day  PATIENT EDUCATION: Education details: see above Person educated: Patient and Spouse Education method: Explanation, Demonstration, Verbal cues, and Handouts Education comprehension: verbalized understanding, returned demonstration, and needs further education     ASSESSMENT:   CLINICAL IMPRESSION: Patient is a 81 y.o. M who was seen today for dysphagia and dysarthria. Instrumental swallow study demonstrates penetration and aspiration with thin and nectar thick liquids. Impairments in swallow include reduced posterior bolus propulsion, reduced bolus cohesion, and a pharyngeal delay. Chin tuck and head turn to left reduce laryngeal penetration, however pt with difficulty recalling to follow this protocol. Wife able to teach back compensations and strategies recommended by home health SLP: do no  mix liquid with food, small bites/sips, not using meltable ice, avoiding certain foods, oral care. Wife, [name], reports that she has to consistently remind pt to follow precautions and compensations. SLP advises using visual aids to support recall. Pt agreeable to therapy course to target pharyngeal strengthening and expiratory muscle strength in effort to improve swallow function and safety. Additionally during evaluation, pt presents with motor speech impairment, characterized by imprecise articulation which reduces overall intelligibility to 85% in this quiet environment. I recommend skilled ST to address  dysphagia and dysarthria.      OBJECTIVE IMPAIRMENTS include dysarthria and dysphagia. These impairments are limiting patient from safety when swallowing and effectively communicating at home and in community. Factors affecting potential to achieve goals and functional outcome are ability to learn/carryover information and previous level of function. Patient will benefit from skilled SLP services to address above impairments and improve overall function.   REHAB POTENTIAL: Fair       GOALS: Goals reviewed with patient? Yes   SHORT TERM GOALS: Target date: 06/14/2022     Pt and wife will report compliance with HEP frequency and repetitions with mod-I over 2 sessions. Baseline: Goal status: ongoing   2.  Pt and wife will report implementation of visual aids at home to support pt recall of targeted swallowing compensations and strategies over 2 sessions.  Baseline:  Goal status: ongoing   3.  Pt will teach back prescribed dysphagia exercises with rare min-A over 2 sessions Baseline:  Goal status: ongoing     LONG TERM GOALS: Target date: 07/12/2022   Pt and spouse will report ongoing compliance with prescribed HEP with mod-I over 1 week period Baseline:  Goal status: ongoing   2.  Using visual aids, pt with comply with targeted swallow precautions and compensations with no more than 2 verbal cues over 10 minute period.  Baseline:  Goal status: ongoing   3.  Pt will improve score on EAT-10 PROM by d/c, indicating subjective improvement in swallow function Baseline:  Goal status: ongoing   4.  Pt will participate in follow up MBSS following therapy course to objectively assess improvement in swallow function and safety.  Baseline:  Goal status: ongoing     PLAN: SLP FREQUENCY: 2x/week   SLP DURATION: 8 weeks   PLANNED INTERVENTIONS: Aspiration precaution training, Pharyngeal strengthening exercises, Diet toleration management , Trials of upgraded texture/liquids, SLP  instruction and feedback, Compensatory strategies, Patient/family education, and Re-evaluation   Su Monks, [place] 05/25/2022, 10:50 AM

## 2022-05-25 NOTE — Therapy (Signed)
OUTPATIENT PHYSICAL THERAPY TREATMENT NOTE   Patient Name: [name] [name] MRN: [id number] DOB:02/17/1941, 81 y.o., male Today's Date: 05/25/2022  PCP: Pleas Koch, NP, Josue Hector, MD REFERRING PROVIDER: Pleas Koch, NP    END OF SESSION:   PT End of Session - 05/25/22 1032     Visit Number 6    Number of Visits 16    Date for PT Re-Evaluation 06/27/22    Authorization Type [employer] - will need auth    Progress Note Due on Visit 10    PT Start Time 0935    PT Stop Time 1020    PT Time Calculation (min) 45 min    Equipment Utilized During Treatment Gait belt    Activity Tolerance Patient tolerated treatment well    Behavior During Therapy [facility] for tasks assessed/performed             Past Medical History:  Diagnosis Date   Basal cell carcinoma 11/09/2019   nod & infil-behind right ear-cx3 &exc   Basal cell carcinoma 03/21/2020   Residual BCC with peripheral margin involved - ST recommends [facility]   Carotid artery occlusion    Coronary artery disease    s/p CABG 2005; sees Dr Johnsie Cancel yearly   Diabetes mellitus without complication ([place])    Gynecomastia    Hypercholesterolemia    Hypertension    Neurodermatitis    Overweight(278.02)    Personal history of colonic polyps 10/08/2006   tubular adenomas   Renal insufficiency    Stroke [facility])    TIA  April 2022   Transient ischemic attack 2010   "lasted ~ 5 seconds"   Vitamin D deficiency    Past Surgical History:  Procedure Laterality Date   CARDIAC CATHETERIZATION  02/2004   "tried to stent; couldn't"   CAROTID ENDARTERECTOMY Right 08/26/2015   COLONOSCOPY     CORONARY ANGIOPLASTY     CORONARY ARTERY BYPASS GRAFT  Feb. 2005   4 vessel   ENDARTERECTOMY Right 08/26/2015   Procedure: Right Carotid ENDARTERECTOMY with Patch Angioplasty ;  Surgeon: Rosetta Posner, MD;  Location: [place];  Service: Vascular;  Laterality: Right;   ENDARTERECTOMY Left 05/18/2021   Procedure: LEFT CAROTID ARTERY  ENDARTERECTOMY  with patch angioplasty;  Surgeon: Rosetta Posner, MD;  Location: [place];  Service: Vascular;  Laterality: Left;   EYE SURGERY     bilateral cataract   KELOID EXCISION  04/2008   on chest scar; Dr. Dessie Coma   KELOID EXCISION     PILONIDAL CYST EXCISION  1989   Patient Active Problem List   Diagnosis Date Noted   Depression 03/22/2022   Stroke-like symptoms 03/01/2022   History of CVA (cerebrovascular accident) 02/28/2022   Aspiration pneumonia vs. 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   Carotid artery disease ([place]) 07/01/2012   Special screening for malignant neoplasm of prostate 05/08/2011   Vitamin D deficiency 11/20/2009   Transient cerebral ischemia 06/30/2009   CEREBROVASCULAR DISEASE 06/30/2009   COLONIC POLYPS 01/10/2008  Essential hypertension 01/10/2008   Coronary atherosclerosis 01/10/2008   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   GYNECOMASTIA, UNILATERAL 01/10/2008   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   SHOULDER PAIN 01/10/2008   Type 2 diabetes mellitus ([place]) 01/10/2008    REFERRING DIAG:  D74.12 (ICD-10-CM) - History of CVA (cerebrovascular accident) I63.81 (ICD-10-CM) - 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 No new complaints.  PAIN:  Are you having pain? No   OBJECTIVE: (objective measures completed at initial evaluation unless otherwise dated)          TODAY'S TREATMENT:  Reviewed patient to see if he remembers what we discussed last session Gait training: 1 x 230', 1 x 460' with RW, tactile cues provided at hips to elevate during swing phase to improve foot clearance, stopped patient randomly during walking to do "balance check" for his partial stance. Pt tends to adduct his legs when stepping which creates instability. Pt educated to keep his stace wider to improve stability. 2nd step taps: 2 x 10 R and L without HHA, verbal and tactile cues to lean to contralateral side Standing pushing 6' box (heavy one) forward about 40 feet with alteranting feet to work on closed chain glut activation: no HHA Gait training: 40 feet without AD, cues for patient to be conscious about his walking and not getting distracted.    PATIENT EDUCATION: Education details: Continuing walking program w/proper AD management, continued practice of proper sit <>stand form  Person educated: Patient and Spouse Education method: Explanation  Education comprehension: verbalized understanding and needs further education      HOME EXERCISE PROGRAM: Access Code: [email or web address] URL: [email or web address] Date: 05/11/2022 Prepared by: Mickie Bail Plaster  Exercises - Sit to Stand Without Arm Support  - 1 x daily - 7 x weekly - 3 sets - 5-7 reps - Heel Raise on Step  - 1 x daily - 7 x weekly - 3 sets - 10 reps - Forward Step Down with Heel Tap and Rail Support  - 1 x daily - 7 x weekly - 3 sets - 10 reps       GOALS: Goals reviewed with patient? Yes   SHORT TERM GOALS: Target date: 05/30/2022   Patient will demo 0.20ms walking speed with walker to improve gait speed and community ambulation Baseline: 0.50 m/s with  walker Goal status: INITIAL   2.  Patient will be able to perform sit to stand without HHA to improve functional strength Baseline: Requires 1-2 HHA from bed or chair to stand up Goal status: INITIAL   3.  Patient will report 50% compliance with HEP to improve self management of symptoms Baseline: HEP to be issued 2nd visit Goal status: INITIAL     LONG TERM GOALS: Target date: 06/27/2022   Patient will demo gait speed of >0.82m with LRAD to improve community ambulation and decrease fall risk Baseline: 0.5 m/s with walker Goal status: INITIAL   2.  Pt will demo BBS of >50/56 to reduce fall risk Baseline: 42/56 (05/02/22) Goal status: INITIAL   3.  Patient will demo TUG <15 seconds with LRAD to improve functional mobility Baseline: 21 sec with RW Goal  status: INITIAL   4. Patient will report 10 points of improvement on FOTO to improve overall function Baseline: 66 (05/02/22) Goal status: INITIAL   ASSESSMENT:   CLINICAL IMPRESSION: Pt demonstrated improved awareness of his walking and demonstrated improved stride length. Pt tends to cross his legs intermittently when walking which creates instability in his walking. Today's session continued to focus on glut med strengthening.     OBJECTIVE IMPAIRMENTS Abnormal gait, decreased activity tolerance, decreased balance, decreased endurance, decreased mobility, difficulty walking, decreased ROM, decreased strength, impaired flexibility, improper body mechanics, and postural dysfunction.    ACTIVITY LIMITATIONS cleaning, community activity, and driving.    PERSONAL FACTORS Age, Past/current experiences, Time since onset of injury/illness/exacerbation, and 1-2 comorbidities: TN, HLD, DMII, CAD, carotid artery disease s/p R CEA and history of multiple strokes  are also affecting patient's functional outcome.      REHAB POTENTIAL: Good   CLINICAL DECISION MAKING: Stable/uncomplicated   EVALUATION COMPLEXITY: Low   PLAN: PT  FREQUENCY: 2x/week   PT DURATION: 8 weeks   PLANNED INTERVENTIONS: Therapeutic exercises, Therapeutic activity, Neuromuscular re-education, Balance training, Gait training, Patient/Family education, Joint mobilization, Stair training, Orthotic/Fit training, Cryotherapy, Moist heat, and Manual therapy   PLAN FOR NEXT SESSION: HEP is going good. Work on closed [employer] med activation ( R weaker compared to Left) .   [name], PT 05/25/2022, 10:41 AM

## 2022-05-26 NOTE — Assessment & Plan Note (Signed)
Acute worsening of chronic issue.  Could be worsened with recent anxiety.  He has upcoming appointment with cardiology in the meantime I have recommended continued montitoring on current regimen. If BP remains  > 140/90 consistently over the next few days.. restart the HCTZ 12.5 mg daily.  Continue losartan 2 tabs 25 mg daily.

## 2022-05-29 ENCOUNTER — Other Ambulatory Visit: Payer: Self-pay | Admitting: Primary Care

## 2022-05-29 DIAGNOSIS — I10 Essential (primary) hypertension: Secondary | ICD-10-CM

## 2022-05-30 ENCOUNTER — Telehealth: Payer: Self-pay

## 2022-05-30 ENCOUNTER — Ambulatory Visit: Payer: Medicare PPO | Admitting: Speech Pathology

## 2022-05-30 ENCOUNTER — Ambulatory Visit: Payer: Medicare PPO

## 2022-05-30 DIAGNOSIS — R1312 Dysphagia, oropharyngeal phase: Secondary | ICD-10-CM | POA: Diagnosis not present

## 2022-05-30 DIAGNOSIS — M6281 Muscle weakness (generalized): Secondary | ICD-10-CM | POA: Diagnosis not present

## 2022-05-30 DIAGNOSIS — R2681 Unsteadiness on feet: Secondary | ICD-10-CM

## 2022-05-30 DIAGNOSIS — R1310 Dysphagia, unspecified: Secondary | ICD-10-CM

## 2022-05-30 DIAGNOSIS — Z8673 Personal history of transient ischemic attack (TIA), and cerebral infarction without residual deficits: Secondary | ICD-10-CM | POA: Diagnosis not present

## 2022-05-30 DIAGNOSIS — R2689 Other abnormalities of gait and mobility: Secondary | ICD-10-CM | POA: Diagnosis not present

## 2022-05-30 DIAGNOSIS — R471 Dysarthria and anarthria: Secondary | ICD-10-CM | POA: Diagnosis not present

## 2022-05-30 DIAGNOSIS — I6381 Other cerebral infarction due to occlusion or stenosis of small artery: Secondary | ICD-10-CM | POA: Diagnosis not present

## 2022-05-30 NOTE — Therapy (Signed)
OUTPATIENT SPEECH LANGUAGE PATHOLOGY DYSPHAGIA TREATMENT NOTE   Patient Name: [name] [name] MRN: [id number] DOB:03/13/1941, 81 y.o., male Today's Date: 05/30/2022  PCP: Pleas Koch, NP  REFERRING PROVIDER: Pleas Koch, NP   END OF SESSION:   End of Session - 05/30/22 0937     Visit Number 4    Number of Visits 17    Date for SLP Re-Evaluation 07/12/22    Authorization Type [employer]    Progress Note Due on Visit 10    SLP Start Time [phone]    SLP Stop Time  1015    SLP Time Calculation (min) 38 min    Activity Tolerance Patient tolerated treatment well               Past Medical History:  Diagnosis Date   Basal cell carcinoma 11/09/2019   nod & infil-behind right ear-cx3 &exc   Basal cell carcinoma 03/21/2020   Residual BCC with peripheral margin involved - ST recommends [facility]   Carotid artery occlusion    Coronary artery disease    s/p CABG 2005; sees Dr Johnsie Cancel yearly   Diabetes mellitus without complication ([place])    Gynecomastia    Hypercholesterolemia    Hypertension    Neurodermatitis    Overweight(278.02)    Personal history of colonic polyps 10/08/2006   tubular adenomas   Renal insufficiency    Stroke [facility])    TIA  April 2022   Transient ischemic attack 2010   "lasted ~ 5 seconds"   Vitamin D deficiency    Past Surgical History:  Procedure Laterality Date   CARDIAC CATHETERIZATION  02/2004   "tried to stent; couldn't"   CAROTID ENDARTERECTOMY Right 08/26/2015   COLONOSCOPY     CORONARY ANGIOPLASTY     CORONARY ARTERY BYPASS GRAFT  Feb. 2005   4 vessel   ENDARTERECTOMY Right 08/26/2015   Procedure: Right Carotid ENDARTERECTOMY with Patch Angioplasty ;  Surgeon: Rosetta Posner, MD;  Location: [place];  Service: Vascular;  Laterality: Right;   ENDARTERECTOMY Left 05/18/2021   Procedure: LEFT CAROTID ARTERY ENDARTERECTOMY  with patch angioplasty;  Surgeon: Rosetta Posner, MD;  Location: [place];  Service: Vascular;  Laterality: Left;    EYE SURGERY     bilateral cataract   KELOID EXCISION  04/2008   on chest scar; Dr. Dessie Coma   KELOID EXCISION     PILONIDAL CYST EXCISION  1989   Patient Active Problem List   Diagnosis Date Noted   Depression 03/22/2022   Stroke-like symptoms 03/01/2022   History of CVA (cerebrovascular accident) 02/28/2022   Aspiration pneumonia vs. 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   TIA (transient ischemic attack) 04/03/2021   Lower extremity edema 08/13/2019   Unsteady gait 06/09/2019   Trigger ring finger of right hand 06/02/2018   Hyperlipidemia 01/30/2016   Carotid stenosis 08/26/2015   Anxiety, 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   Carotid artery disease ([place]) 07/01/2012   Special screening for malignant neoplasm of prostate 05/08/2011   Vitamin D deficiency 11/20/2009   Transient cerebral ischemia 06/30/2009   CEREBROVASCULAR DISEASE 06/30/2009   COLONIC POLYPS 01/10/2008   Essential hypertension 01/10/2008   Coronary atherosclerosis 01/10/2008   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   GYNECOMASTIA, UNILATERAL 01/10/2008  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   SHOULDER PAIN 01/10/2008   Type 2 diabetes mellitus ([place]) 01/10/2008    ONSET DATE: 02/28/2022    REFERRING DIAG: L39.03 (ICD-10-CM) - History of CVA (cerebrovascular accident) R13.10 (ICD-10-CM) - Dysphagia, unspecified type J69.0 (ICD-10-CM) - Aspiration pneumonia, unspecified aspiration pneumonia type, unspecified laterality, unspecified part of lung ([place])   THERAPY DIAG:  Dysphagia, unspecified type  SUBJECTIVE:   SUBJECTIVE STATEMENT: Reports compliance with HEP, wife tells ST she has to "pull teeth" to get exercises completed  PAIN:  Are you having pain? No  OBJECTIVE:    RECOMMENDATIONS FROM OBJECTIVE SWALLOW STUDY (MBSS/FEES):  regular diet Objective swallow impairments: reduced posterior bolus propulsion, reduced bolus cohesion, and a pharyngeal delay. Penetration (PAS 3, 5) was noted with thin and nectar thick liquids via cup and straw and aspiration (PAS 7, 8) was observed with consecutive swallows of thin liquids, with thin liquids via cup when the pill was taken and with consecutive swallows of nectar thick liquids. Instances of aspiration inconsistently triggered delayed coughing and this was noted most frequently with larger amounts of aspiration. Coughing was ineffective in expelling the aspirate, but cued coughing did expel penetrated material. With thin and nectar thick liquids, penetration and aspiration were eliminated with a chin tuck posture with left head turn and use of individual swallows. Laryngeal invasion was not observed with individual swallows of thin liquids when pt was cued to swallow on the count of three. Laryngeal invasion was also improved to a functional level of (PAS 2) with smaller boluses of thin liquids. No instances of penetration/aspiration were noted with honey thick liquids. Objective recommended compensations: cup, no straw; meds with puree, small bite/sips, slow rate  TODAY'S TREATMENT:   DYSPHAGIA TREATMENT:   Current diet: regular and honey thick liquids Swallow precautions: small bites, small sips, alternate bites and sips, chin tuck, head turn, stay upright 30 minutes post meal, hard swallow, no straws, and avoid mixed consistencies Patient directly observed with POs: Yes: thin liquids and honey thick liquids  Feeding: able to feed self Liquids provided by: teaspoon and straw (thin, honey respectively) Oral phase signs and symptoms:  n/a Pharyngeal phase signs and symptoms: immediate cough, delayed cough, and watery eyes Therapeutic exercises: Effortful Swallow, Masako, and EMST Types of cueing: verbal Amount of  cueing: moderate Treatment comments:  With thin liquid administration, pt w/o s/sx aspiration using chin tuck and head turn. Without posture adjustment, pt with consistent immediate cough and watering eyes. Advised that if pt wants thin liquid, he should consider limiting to mealtimes and always using compensations and postural adjustments. Led pt through AM iteration of dysphagia exercise. Needs mod-A for hard, fast exhalation for EMST.     HOME EXERCISE PROGRAM: Exercises: Effortful Swallow 20-30 repetitions, 3 x/day, Masako 10 repetitions, 3 x/day, Shaker 10 repetitions, 3 x/day, and EMST 10 repetitions, 2 x/day  PATIENT EDUCATION: Education details: see above Person educated: Patient and Spouse Education method: Explanation, Demonstration, Verbal cues, and Handouts Education comprehension: verbalized understanding, returned demonstration, and needs further education     ASSESSMENT:   CLINICAL IMPRESSION: Patient is a 81 y.o. M who was seen today for dysphagia and dysarthria. Instrumental swallow study demonstrates penetration and aspiration with thin and nectar thick liquids. Impairments in swallow include reduced posterior bolus propulsion, reduced bolus cohesion, and a pharyngeal delay. Chin tuck and head turn to left reduce laryngeal penetration, however pt with difficulty recalling to follow this protocol. Wife able to teach back compensations and  strategies recommended by home health SLP: do no mix liquid with food, small bites/sips, not using meltable ice, avoiding certain foods, oral care. Wife, [name], reports that she has to consistently remind pt to follow precautions and compensations. SLP advises using visual aids to support recall. Pt agreeable to therapy course to target pharyngeal strengthening and expiratory muscle strength in effort to improve swallow function and safety. Additionally during evaluation, pt presents with motor speech impairment, characterized by imprecise  articulation which reduces overall intelligibility to 85% in this quiet environment. I recommend skilled ST to address dysphagia and dysarthria.      OBJECTIVE IMPAIRMENTS include dysarthria and dysphagia. These impairments are limiting patient from safety when swallowing and effectively communicating at home and in community. Factors affecting potential to achieve goals and functional outcome are ability to learn/carryover information and previous level of function. Patient will benefit from skilled SLP services to address above impairments and improve overall function.   REHAB POTENTIAL: Fair       GOALS: Goals reviewed with patient? Yes   SHORT TERM GOALS: Target date: 06/14/2022     Pt and wife will report compliance with HEP frequency and repetitions with mod-I over 2 sessions. Baseline: Goal status: ongoing   2.  Pt and wife will report implementation of visual aids at home to support pt recall of targeted swallowing compensations and strategies over 2 sessions.  Baseline:  Goal status: ongoing   3.  Pt will teach back prescribed dysphagia exercises with rare min-A over 2 sessions Baseline:  Goal status: ongoing     LONG TERM GOALS: Target date: 07/12/2022   Pt and spouse will report ongoing compliance with prescribed HEP with mod-I over 1 week period Baseline:  Goal status: ongoing   2.  Using visual aids, pt with comply with targeted swallow precautions and compensations with no more than 2 verbal cues over 10 minute period.  Baseline:  Goal status: ongoing   3.  Pt will improve score on EAT-10 PROM by d/c, indicating subjective improvement in swallow function Baseline:  Goal status: ongoing   4.  Pt will participate in follow up MBSS following therapy course to objectively assess improvement in swallow function and safety.  Baseline:  Goal status: ongoing     PLAN: SLP FREQUENCY: 2x/week   SLP DURATION: 8 weeks   PLANNED INTERVENTIONS: Aspiration precaution  training, Pharyngeal strengthening exercises, Diet toleration management , Trials of upgraded texture/liquids, SLP instruction and feedback, Compensatory strategies, Patient/family education, and Re-evaluation   Su Monks, [place] 05/30/2022, 9:38 AM

## 2022-05-30 NOTE — Patient Instructions (Addendum)
Hard Swallow (30) Tongue Out (15) EMST (15)  Wed AM     Wed PM     Thu AM     Thu PM     Fri AM     Fri PM     Sat AM     Sat PM     Sun AM     Sun PM     Mon AM     Mon PM     Tue AM     Tue PM

## 2022-05-30 NOTE — Telephone Encounter (Signed)
Patient's wife is calling in stating they have an annual physical scheduled for July and is asking for the orders to be placed so they can go to [facility] to have them done.

## 2022-05-30 NOTE — Therapy (Signed)
OUTPATIENT PHYSICAL THERAPY TREATMENT NOTE   Patient Name: [name] [name] MRN: [id number] DOB:1941-04-22, 81 y.o., male Today's Date: 05/30/2022  PCP: Pleas Koch, NP, Josue Hector, MD REFERRING PROVIDER: Pleas Koch, NP    END OF SESSION:   PT End of Session - 05/30/22 1020     Visit Number 7    Number of Visits 16    Date for PT Re-Evaluation 06/27/22    Authorization Type [employer] - will need auth    Progress Note Due on Visit 10    PT Start Time 0277    PT Stop Time 1045    PT Time Calculation (min) 30 min    Equipment Utilized During Treatment Gait belt    Activity Tolerance Patient tolerated treatment well    Behavior During Therapy [facility] for tasks assessed/performed             Past Medical History:  Diagnosis Date   Basal cell carcinoma 11/09/2019   nod & infil-behind right ear-cx3 &exc   Basal cell carcinoma 03/21/2020   Residual BCC with peripheral margin involved - ST recommends [facility]   Carotid artery occlusion    Coronary artery disease    s/p CABG 2005; sees Dr Johnsie Cancel yearly   Diabetes mellitus without complication ([place])    Gynecomastia    Hypercholesterolemia    Hypertension    Neurodermatitis    Overweight(278.02)    Personal history of colonic polyps 10/08/2006   tubular adenomas   Renal insufficiency    Stroke [facility])    TIA  April 2022   Transient ischemic attack 2010   "lasted ~ 5 seconds"   Vitamin D deficiency    Past Surgical History:  Procedure Laterality Date   CARDIAC CATHETERIZATION  02/2004   "tried to stent; couldn't"   CAROTID ENDARTERECTOMY Right 08/26/2015   COLONOSCOPY     CORONARY ANGIOPLASTY     CORONARY ARTERY BYPASS GRAFT  Feb. 2005   4 vessel   ENDARTERECTOMY Right 08/26/2015   Procedure: Right Carotid ENDARTERECTOMY with Patch Angioplasty ;  Surgeon: Rosetta Posner, MD;  Location: [place];  Service: Vascular;  Laterality: Right;   ENDARTERECTOMY Left 05/18/2021   Procedure: LEFT CAROTID ARTERY  ENDARTERECTOMY  with patch angioplasty;  Surgeon: Rosetta Posner, MD;  Location: [place];  Service: Vascular;  Laterality: Left;   EYE SURGERY     bilateral cataract   KELOID EXCISION  04/2008   on chest scar; Dr. Dessie Coma   KELOID EXCISION     PILONIDAL CYST EXCISION  1989   Patient Active Problem List   Diagnosis Date Noted   Depression 03/22/2022   Stroke-like symptoms 03/01/2022   History of CVA (cerebrovascular accident) 02/28/2022   Aspiration pneumonia vs. 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   Carotid artery disease ([place]) 07/01/2012   Special screening for malignant neoplasm of prostate 05/08/2011   Vitamin D deficiency 11/20/2009   Transient cerebral ischemia 06/30/2009   CEREBROVASCULAR DISEASE 06/30/2009   COLONIC POLYPS 01/10/2008  Essential hypertension 01/10/2008   Coronary atherosclerosis 01/10/2008   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   GYNECOMASTIA, UNILATERAL 01/10/2008   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   SHOULDER PAIN 01/10/2008   Type 2 diabetes mellitus ([place]) 01/10/2008    REFERRING DIAG:  N05.39 (ICD-10-CM) - History of CVA (cerebrovascular accident) I63.81 (ICD-10-CM) - 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 Pt hasn't had chance to walk outside due to rain. They have been practicing box taping exercises.  PAIN:  Are you having pain? No   OBJECTIVE: (objective measures completed at initial evaluation unless otherwise dated)          TODAY'S TREATMENT:   Q-ped: alternating UE extensions: 10 x and LE extensions: 10 x R and L alternating Prone hip extensions: 2 x 10 R and L Q-ped with pillow between knees and hands and pt taps on top of the pillow with knees as far up as he can: 2 x 5 Prone to q-ped to prone transition: 3x during above exercises. PT asked to walk around 4 x 115' with walker, pt was asked to self analyze his walking to report on how he did. Pt thought he did okay. In reality, he stubbed his left toe 100% of the time during heel contact during 1st walk and 90% of the time during second walk. Pt was asked to focus on his walking not get distracted by other people in the gym. Pt was asked qeustion to self analyze his wlaking to hopefully improve his safety awareness and focus during his walk. During his last walking pt was educated to look down his feet, pt improved his shuffling by 50% by last walking.     PATIENT EDUCATION: Education details: Continuing walking program w/proper AD management, continued practice of proper sit <>stand form  Person educated: Patient and Spouse Education method: Explanation  Education comprehension: verbalized understanding and needs further education      HOME EXERCISE PROGRAM: Access Code: [email or web address] URL: [email or web address] Date: 05/11/2022 Prepared by: Mickie Bail Plaster  Exercises - Sit to Stand Without Arm Support  - 1 x daily - 7 x weekly - 3 sets - 5-7 reps - Heel Raise on Step  - 1 x daily - 7 x weekly - 3 sets - 10 reps - Forward Step Down with Heel Tap and Rail Support  - 1 x daily - 7 x weekly - 3 sets - 10 reps       GOALS: Goals  reviewed with patient? Yes   SHORT TERM GOALS: Target date: 05/30/2022   Patient will demo 0.46ms walking speed with walker to improve gait speed and community ambulation Baseline: 0.50 m/s with walker Goal status: INITIAL   2.  Patient will be able to perform sit to stand without HHA to improve functional strength Baseline: Requires 1-2 HHA from bed or chair to stand up Goal status: INITIAL   3.  Patient will report 50% compliance with HEP to improve self management of symptoms Baseline: HEP to be issued 2nd visit Goal status: INITIAL     LONG TERM GOALS: Target date: 06/27/2022   Patient will demo gait speed of >0.860m with LRAD to improve community  ambulation and decrease fall risk Baseline: 0.5 m/s with walker Goal status: INITIAL   2.  Pt will demo BBS of >50/56 to reduce fall risk Baseline: 42/56 (05/02/22) Goal status: INITIAL   3.  Patient will demo TUG <15 seconds with LRAD to improve functional mobility Baseline: 21 sec with RW Goal status: INITIAL   4. Patient will report 10 points of improvement on FOTO to improve overall function Baseline: 66 (05/02/22) Goal status: INITIAL   ASSESSMENT:   CLINICAL IMPRESSION: Pt gets easily distracted during the session and needs to redirect his attention to focus on his walking. Pt performed better with cueing to look down at this feet today. Today's session focused on improving patient's self awareness of his gait quality to see if can self correct it and improve safety Pt had few fwd stumbles during the walking where if walker wasn't there, he would have fallen forward.      OBJECTIVE IMPAIRMENTS Abnormal gait, decreased activity tolerance, decreased balance, decreased endurance, decreased mobility, difficulty walking, decreased ROM, decreased strength, impaired flexibility, improper body mechanics, and postural dysfunction.    ACTIVITY LIMITATIONS cleaning, community activity, and driving.    PERSONAL FACTORS Age, Past/current  experiences, Time since onset of injury/illness/exacerbation, and 1-2 comorbidities: TN, HLD, DMII, CAD, carotid artery disease s/p R CEA and history of multiple strokes  are also affecting patient's functional outcome.      REHAB POTENTIAL: Good   CLINICAL DECISION MAKING: Stable/uncomplicated   EVALUATION COMPLEXITY: Low   PLAN: PT FREQUENCY: 2x/week   PT DURATION: 8 weeks   PLANNED INTERVENTIONS: Therapeutic exercises, Therapeutic activity, Neuromuscular re-education, Balance training, Gait training, Patient/Family education, Joint mobilization, Stair training, Orthotic/Fit training, Cryotherapy, Moist heat, and Manual therapy   PLAN FOR NEXT SESSION: HEP is going good. Work on closed [employer] med activation ( R weaker compared to Left) .   [name], PT 05/30/2022, 11:05 AM

## 2022-05-30 NOTE — Telephone Encounter (Signed)
Most of his labs are updated given his increased need for lab draws.  I recommend that we meet first and then determine which labs are needed.  His physical is still 5 weeks away.

## 2022-05-31 NOTE — Telephone Encounter (Signed)
Called patients wife gave information will call if any questions.

## 2022-06-01 ENCOUNTER — Ambulatory Visit: Payer: Medicare PPO | Admitting: Speech Pathology

## 2022-06-01 ENCOUNTER — Ambulatory Visit: Payer: Medicare PPO | Attending: Primary Care

## 2022-06-01 DIAGNOSIS — R1310 Dysphagia, unspecified: Secondary | ICD-10-CM | POA: Diagnosis not present

## 2022-06-01 DIAGNOSIS — R2681 Unsteadiness on feet: Secondary | ICD-10-CM | POA: Insufficient documentation

## 2022-06-01 DIAGNOSIS — M6281 Muscle weakness (generalized): Secondary | ICD-10-CM | POA: Diagnosis not present

## 2022-06-01 DIAGNOSIS — R1312 Dysphagia, oropharyngeal phase: Secondary | ICD-10-CM | POA: Diagnosis not present

## 2022-06-01 DIAGNOSIS — R2689 Other abnormalities of gait and mobility: Secondary | ICD-10-CM | POA: Diagnosis not present

## 2022-06-01 NOTE — Therapy (Signed)
OUTPATIENT PHYSICAL THERAPY TREATMENT NOTE   Patient Name: [name] [name] MRN: [id number] DOB:Aug 15, 1941, 81 y.o., male Today's Date: 06/01/2022  PCP: Pleas Koch, NP, Josue Hector, MD REFERRING PROVIDER: Pleas Koch, NP    END OF SESSION:   PT End of Session - 06/01/22 1256     Visit Number 8    Number of Visits 16    Date for PT Re-Evaluation 06/27/22    Authorization Type [employer] - will need auth    Progress Note Due on Visit 10    PT Start Time 3500    PT Stop Time 1315    PT Time Calculation (min) 40 min    Equipment Utilized During Treatment Gait belt    Activity Tolerance Patient tolerated treatment well    Behavior During Therapy [facility] for tasks assessed/performed             Past Medical History:  Diagnosis Date   Basal cell carcinoma 11/09/2019   nod & infil-behind right ear-cx3 &exc   Basal cell carcinoma 03/21/2020   Residual BCC with peripheral margin involved - ST recommends [facility]   Carotid artery occlusion    Coronary artery disease    s/p CABG 2005; sees Dr Johnsie Cancel yearly   Diabetes mellitus without complication ([place])    Gynecomastia    Hypercholesterolemia    Hypertension    Neurodermatitis    Overweight(278.02)    Personal history of colonic polyps 10/08/2006   tubular adenomas   Renal insufficiency    Stroke [facility])    TIA  April 2022   Transient ischemic attack 2010   "lasted ~ 5 seconds"   Vitamin D deficiency    Past Surgical History:  Procedure Laterality Date   CARDIAC CATHETERIZATION  02/2004   "tried to stent; couldn't"   CAROTID ENDARTERECTOMY Right 08/26/2015   COLONOSCOPY     CORONARY ANGIOPLASTY     CORONARY ARTERY BYPASS GRAFT  Feb. 2005   4 vessel   ENDARTERECTOMY Right 08/26/2015   Procedure: Right Carotid ENDARTERECTOMY with Patch Angioplasty ;  Surgeon: Rosetta Posner, MD;  Location: [place];  Service: Vascular;  Laterality: Right;   ENDARTERECTOMY Left 05/18/2021   Procedure: LEFT CAROTID ARTERY  ENDARTERECTOMY  with patch angioplasty;  Surgeon: Rosetta Posner, MD;  Location: [place];  Service: Vascular;  Laterality: Left;   EYE SURGERY     bilateral cataract   KELOID EXCISION  04/2008   on chest scar; Dr. Dessie Coma   KELOID EXCISION     PILONIDAL CYST EXCISION  1989   Patient Active Problem List   Diagnosis Date Noted   Depression 03/22/2022   Stroke-like symptoms 03/01/2022   History of CVA (cerebrovascular accident) 02/28/2022   Aspiration pneumonia vs. 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   Carotid artery disease ([place]) 07/01/2012   Special screening for malignant neoplasm of prostate 05/08/2011   Vitamin D deficiency 11/20/2009   Transient cerebral ischemia 06/30/2009   CEREBROVASCULAR DISEASE 06/30/2009   COLONIC POLYPS 01/10/2008  Essential hypertension 01/10/2008   Coronary atherosclerosis 01/10/2008   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   GYNECOMASTIA, UNILATERAL 01/10/2008   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   SHOULDER PAIN 01/10/2008   Type 2 diabetes mellitus ([place]) 01/10/2008    REFERRING DIAG:  O17.71 (ICD-10-CM) - History of CVA (cerebrovascular accident) I63.81 (ICD-10-CM) - 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 I felt soreness in m stomach muscles L knee and Left arm. PAIN:  Are you having pain? No   OBJECTIVE: (objective measures completed at initial evaluation unless otherwise dated)       Gait speed: 0.55 m/s with RW 05/31/22 TUG: 26 sec with RW 05/31/22   TODAY'S TREATMENT:   Gait training on wide walk: 270' with RW, cues for looking down and pelvis elevation during swing phase. Due to lack of distraction, pt performed well on ouside where he was able to pick up his legs better 70% of the time with intermittent cues. Gait training: 1 x 33' with pt holding onto bil PT's hand in front, with one leg on grass and one leg on sidewalk to fasciliate increased hip flexion with leg that is on grass: 1 x 40' R and L, total 80' Sit to stand: 10x  Practiced turning 360 deg with walker: moving walker 90 deg then initiating turning with ipsilateral leg and then stomping with both legs. 3x cw and 3x ccw      PATIENT EDUCATION: Education details: Continuing walking program w/proper AD management, continued practice of proper sit <>stand form  Person educated: Patient and Spouse Education method: Explanation  Education comprehension: verbalized understanding and needs further education      HOME EXERCISE PROGRAM: Access Code: [email or web address] URL: [email or web address] Date: 05/11/2022 Prepared by: Mickie Bail Plaster  Exercises - Sit to Stand Without Arm Support  - 1 x daily - 7 x weekly - 3 sets - 5-7 reps - Heel Raise on Step  - 1 x daily - 7 x weekly - 3 sets - 10 reps - Forward Step Down with Heel Tap and Rail Support  - 1 x daily - 7 x weekly - 3 sets - 10 reps       GOALS: Goals reviewed with patient? Yes   SHORT TERM GOALS: Target date: 05/30/2022   Patient will demo 0.60ms walking speed with walker to improve gait speed and community ambulation Baseline: 0.50 m/s with walker; 0.5103m with walker(06/01/22)   Goal status: not met   2.  Patient will be able to perform sit to stand without HHA to improve functional strength Baseline: Requires 1-2 HHA from bed or chair to stand up (eval); able to do 2 reps without HHA but requires multiple attempt to stand up Goal status: Goal met.   3.  Patient will report 50% compliance with HEP to improve self management of symptoms Baseline: HEP to be issued 2nd visit Goal status: goal met.     LONG TERM GOALS: Target date: 06/27/2022   Patient will demo gait speed of >0.7m44mwith LRAD to improve community ambulation and decrease fall risk Baseline: 0.5 m/s with walker; 0.55 m/s (06/01/22) Goal status: INITIAL   2.  Pt will  demo BBS of >50/56 to reduce fall risk Baseline: 42/56 (05/02/22) Goal status: INITIAL   3.  Patient will demo TUG <15 seconds with LRAD to improve functional mobility Baseline: 21 sec with RW; 26 sec with RW (06/01/22) Goal status: Progressing    4. Patient will report 10 points of improvement on FOTO to improve overall function Baseline: 66 (05/02/22) Goal status: INITIAL   ASSESSMENT:   CLINICAL IMPRESSION: Met STG #2 and 3. Did not meet STG #1 but progressing. Pt demo regression on TUG but it was mainly due to pt being conscious of his walking more      OBJECTIVE IMPAIRMENTS Abnormal gait, decreased activity tolerance, decreased balance, decreased endurance, decreased mobility, difficulty walking, decreased ROM, decreased strength, impaired flexibility, improper body mechanics, and postural dysfunction.    ACTIVITY LIMITATIONS cleaning, community activity, and driving.    PERSONAL FACTORS Age, Past/current experiences, Time since onset of injury/illness/exacerbation, and 1-2 comorbidities: TN, HLD, DMII, CAD, carotid artery disease s/p R CEA and history of multiple strokes  are also affecting patient's functional outcome.      REHAB POTENTIAL: Good   CLINICAL DECISION MAKING: Stable/uncomplicated   EVALUATION COMPLEXITY: Low    PLAN: PT FREQUENCY: 2x/week   PT DURATION: 8 weeks   PLANNED INTERVENTIONS: Therapeutic exercises, Therapeutic activity, Neuromuscular re-education, Balance training, Gait training, Patient/Family education, Joint mobilization, Stair training, Orthotic/Fit training, Cryotherapy, Moist heat, and Manual therapy   PLAN FOR NEXT SESSION: HEP is going good. Work on closed [employer] med activation ( R weaker compared to Left) .   [name], PT 06/01/2022, 1:20 PM

## 2022-06-01 NOTE — Therapy (Signed)
OUTPATIENT SPEECH LANGUAGE PATHOLOGY DYSPHAGIA TREATMENT NOTE   Patient Name: [name] [name] MRN: [id number] DOB:1941/05/08, 81 y.o., male Today's Date: 06/01/2022  PCP: Pleas Koch, NP  REFERRING PROVIDER: Pleas Koch, NP   END OF SESSION:   End of Session - 06/01/22 1414     Visit Number 5    Number of Visits 17    Date for SLP Re-Evaluation 07/12/22    Authorization Type [employer]    Progress Note Due on Visit 10    SLP Start Time 1400    SLP Stop Time  1435    SLP Time Calculation (min) 35 min    Activity Tolerance Patient tolerated treatment well                Past Medical History:  Diagnosis Date   Basal cell carcinoma 11/09/2019   nod & infil-behind right ear-cx3 &exc   Basal cell carcinoma 03/21/2020   Residual BCC with peripheral margin involved - ST recommends [facility]   Carotid artery occlusion    Coronary artery disease    s/p CABG 2005; sees Dr Johnsie Cancel yearly   Diabetes mellitus without complication ([place])    Gynecomastia    Hypercholesterolemia    Hypertension    Neurodermatitis    Overweight(278.02)    Personal history of colonic polyps 10/08/2006   tubular adenomas   Renal insufficiency    Stroke [facility])    TIA  April 2022   Transient ischemic attack 2010   "lasted ~ 5 seconds"   Vitamin D deficiency    Past Surgical History:  Procedure Laterality Date   CARDIAC CATHETERIZATION  02/2004   "tried to stent; couldn't"   CAROTID ENDARTERECTOMY Right 08/26/2015   COLONOSCOPY     CORONARY ANGIOPLASTY     CORONARY ARTERY BYPASS GRAFT  Feb. 2005   4 vessel   ENDARTERECTOMY Right 08/26/2015   Procedure: Right Carotid ENDARTERECTOMY with Patch Angioplasty ;  Surgeon: Rosetta Posner, MD;  Location: [place];  Service: Vascular;  Laterality: Right;   ENDARTERECTOMY Left 05/18/2021   Procedure: LEFT CAROTID ARTERY ENDARTERECTOMY  with patch angioplasty;  Surgeon: Rosetta Posner, MD;  Location: [place];  Service: Vascular;  Laterality: Left;    EYE SURGERY     bilateral cataract   KELOID EXCISION  04/2008   on chest scar; Dr. Dessie Coma   KELOID EXCISION     PILONIDAL CYST EXCISION  1989   Patient Active Problem List   Diagnosis Date Noted   Depression 03/22/2022   Stroke-like symptoms 03/01/2022   History of CVA (cerebrovascular accident) 02/28/2022   Aspiration pneumonia vs. 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   Carotid artery disease ([place]) 07/01/2012   Special screening for malignant neoplasm of prostate 05/08/2011   Vitamin D deficiency 11/20/2009   Transient cerebral ischemia 06/30/2009   CEREBROVASCULAR DISEASE 06/30/2009   COLONIC POLYPS 01/10/2008   Essential hypertension 01/10/2008   Coronary atherosclerosis 01/10/2008   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   GYNECOMASTIA, UNILATERAL 01/10/2008  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   SHOULDER PAIN 01/10/2008   Type 2 diabetes mellitus ([place]) 01/10/2008    ONSET DATE: 02/28/2022    REFERRING DIAG: W73.71 (ICD-10-CM) - History of CVA (cerebrovascular accident) R13.10 (ICD-10-CM) - Dysphagia, unspecified type J69.0 (ICD-10-CM) - Aspiration pneumonia, unspecified aspiration pneumonia type, unspecified laterality, unspecified part of lung ([place])   THERAPY DIAG:  Dysphagia, oropharyngeal phase  SUBJECTIVE:   SUBJECTIVE STATEMENT: "he's tired today"  wife, re: pt  PAIN:  Are you having pain? No  OBJECTIVE:   RECOMMENDATIONS FROM OBJECTIVE SWALLOW STUDY (MBSS/FEES):   regular diet Objective swallow impairments: reduced posterior bolus propulsion, reduced bolus cohesion, and a pharyngeal delay. Penetration (PAS 3, 5) was noted with thin and nectar thick liquids via cup and straw and aspiration (PAS 7, 8) was observed with consecutive swallows of thin liquids, with thin liquids via cup when the pill was taken and with consecutive swallows of nectar thick liquids. Instances of aspiration inconsistently triggered delayed coughing and this was noted most frequently with larger amounts of aspiration. Coughing was ineffective in expelling the aspirate, but cued coughing did expel penetrated material. With thin and nectar thick liquids, penetration and aspiration were eliminated with a chin tuck posture with left head turn and use of individual swallows. Laryngeal invasion was not observed with individual swallows of thin liquids when pt was cued to swallow on the count of three. Laryngeal invasion was also improved to a functional level of (PAS 2) with smaller boluses of thin liquids. No instances of penetration/aspiration were noted with honey thick liquids. Objective recommended compensations: cup, no straw; meds with puree, small bite/sips, slow rate  TODAY'S TREATMENT:   DYSPHAGIA TREATMENT:   Current diet: regular and honey thick liquids Swallow precautions: small bites, small sips, alternate bites and sips, chin tuck, head turn, stay upright 30 minutes post meal, hard swallow, no straws, and avoid mixed consistencies Patient directly observed with POs: Yes: thin liquids  Feeding: able to feed self Liquids provided by: straw  Oral phase signs and symptoms:  n/a Pharyngeal phase signs and symptoms:  none with PO trials this date Therapeutic exercises: Effortful Swallow, Masako, and EMST Types of cueing: verbal Amount of cueing: moderate Treatment comments: Pt's wife tells ST she feels like pt's swallow is getting better. Has decreased pumps of thickener to 3 pumps  for 8 oz of liquids vs 4 and pt has not had overt s/sx of aspiration. Able to teach back trained exercises with min-A. Led pt through PM iteration of dysphagia exercises. Rare min-A for hard, fast exhalation for EMST.     HOME EXERCISE PROGRAM: Exercises: Effortful Swallow 20-30 repetitions, 3 x/day, Masako 10 repetitions, 3 x/day, Shaker 10 repetitions, 3 x/day, and EMST 10 repetitions, 2 x/day  PATIENT EDUCATION: Education details: see above Person educated: Patient and Spouse Education method: Explanation, Demonstration, Verbal cues, and Handouts Education comprehension: verbalized understanding, returned demonstration, and needs further education     ASSESSMENT:   CLINICAL IMPRESSION: Patient is a 81 y.o. M who was seen today for dysphagia and dysarthria. Instrumental swallow study demonstrates penetration and aspiration with thin and nectar thick liquids. Impairments in swallow include reduced posterior bolus propulsion, reduced bolus cohesion, and a pharyngeal delay. Chin tuck and head turn to left reduce laryngeal penetration, however pt with difficulty recalling to follow this protocol. Wife able to teach back compensations and strategies recommended by home health SLP: do no mix liquid with food, small bites/sips, not using meltable ice, avoiding  certain foods, oral care. Wife, [name], is thickening all liquids for pt. Ongoing dysphagia exercises with high compliance and subjective reported improvement in swallow function at home.    OBJECTIVE IMPAIRMENTS include dysarthria and dysphagia. These impairments are limiting patient from safety when swallowing and effectively communicating at home and in community. Factors affecting potential to achieve goals and functional outcome are ability to learn/carryover information and previous level of function. Patient will benefit from skilled SLP services to address above impairments and improve overall function.   REHAB POTENTIAL: Fair        GOALS: Goals reviewed with patient? Yes   SHORT TERM GOALS: Target date: 06/14/2022     Pt and wife will report compliance with HEP frequency and repetitions with mod-I over 2 sessions. Baseline: Goal status: ongoing   2.  Pt and wife will report implementation of visual aids at home to support pt recall of targeted swallowing compensations and strategies over 2 sessions.  Baseline:  Goal status: ongoing   3.  Pt will teach back prescribed dysphagia exercises with rare min-A over 2 sessions Baseline:  Goal status: ongoing     LONG TERM GOALS: Target date: 07/12/2022   Pt and spouse will report ongoing compliance with prescribed HEP with mod-I over 1 week period Baseline:  Goal status: ongoing   2.  Using visual aids, pt with comply with targeted swallow precautions and compensations with no more than 2 verbal cues over 10 minute period.  Baseline:  Goal status: ongoing   3.  Pt will improve score on EAT-10 PROM by d/c, indicating subjective improvement in swallow function Baseline:  Goal status: ongoing   4.  Pt will participate in follow up MBSS following therapy course to objectively assess improvement in swallow function and safety.  Baseline:  Goal status: ongoing     PLAN: SLP FREQUENCY: 2x/week   SLP DURATION: 8 weeks   PLANNED INTERVENTIONS: Aspiration precaution training, Pharyngeal strengthening exercises, Diet toleration management , Trials of upgraded texture/liquids, SLP instruction and feedback, Compensatory strategies, Patient/family education, and Re-evaluation   Su Monks, [place] 06/01/2022, 2:21 PM

## 2022-06-04 DIAGNOSIS — H6123 Impacted cerumen, bilateral: Secondary | ICD-10-CM | POA: Diagnosis not present

## 2022-06-06 ENCOUNTER — Ambulatory Visit: Payer: Medicare PPO

## 2022-06-06 ENCOUNTER — Ambulatory Visit: Payer: Medicare PPO | Admitting: Speech Pathology

## 2022-06-06 DIAGNOSIS — R1312 Dysphagia, oropharyngeal phase: Secondary | ICD-10-CM

## 2022-06-06 DIAGNOSIS — M6281 Muscle weakness (generalized): Secondary | ICD-10-CM

## 2022-06-06 DIAGNOSIS — R1310 Dysphagia, unspecified: Secondary | ICD-10-CM | POA: Diagnosis not present

## 2022-06-06 DIAGNOSIS — R2689 Other abnormalities of gait and mobility: Secondary | ICD-10-CM

## 2022-06-06 DIAGNOSIS — R2681 Unsteadiness on feet: Secondary | ICD-10-CM | POA: Diagnosis not present

## 2022-06-06 NOTE — Therapy (Signed)
OUTPATIENT SPEECH LANGUAGE PATHOLOGY DYSPHAGIA TREATMENT NOTE   Patient Name: [name] [name] MRN: [id number] DOB:03/12/1941, 81 y.o., male Today's Date: 06/06/2022  PCP: Pleas Koch, NP  REFERRING PROVIDER: Pleas Koch, NP   END OF SESSION:   End of Session - 06/06/22 1404     Visit Number 6    Number of Visits 17    Date for SLP Re-Evaluation 07/12/22    Authorization Type [employer]    SLP Start Time 1404    SLP Stop Time  1435    SLP Time Calculation (min) 31 min    Activity Tolerance Patient tolerated treatment well                 Past Medical History:  Diagnosis Date   Basal cell carcinoma 11/09/2019   nod & infil-behind right ear-cx3 &exc   Basal cell carcinoma 03/21/2020   Residual BCC with peripheral margin involved - ST recommends [facility]   Carotid artery occlusion    Coronary artery disease    s/p CABG 2005; sees Dr Johnsie Cancel yearly   Diabetes mellitus without complication ([place])    Gynecomastia    Hypercholesterolemia    Hypertension    Neurodermatitis    Overweight(278.02)    Personal history of colonic polyps 10/08/2006   tubular adenomas   Renal insufficiency    Stroke [facility])    TIA  April 2022   Transient ischemic attack 2010   "lasted ~ 5 seconds"   Vitamin D deficiency    Past Surgical History:  Procedure Laterality Date   CARDIAC CATHETERIZATION  02/2004   "tried to stent; couldn't"   CAROTID ENDARTERECTOMY Right 08/26/2015   COLONOSCOPY     CORONARY ANGIOPLASTY     CORONARY ARTERY BYPASS GRAFT  Feb. 2005   4 vessel   ENDARTERECTOMY Right 08/26/2015   Procedure: Right Carotid ENDARTERECTOMY with Patch Angioplasty ;  Surgeon: Rosetta Posner, MD;  Location: [place];  Service: Vascular;  Laterality: Right;   ENDARTERECTOMY Left 05/18/2021   Procedure: LEFT CAROTID ARTERY ENDARTERECTOMY  with patch angioplasty;  Surgeon: Rosetta Posner, MD;  Location: [place];  Service: Vascular;  Laterality: Left;   EYE SURGERY     bilateral  cataract   KELOID EXCISION  04/2008   on chest scar; Dr. Dessie Coma   KELOID EXCISION     PILONIDAL CYST EXCISION  1989   Patient Active Problem List   Diagnosis Date Noted   Depression 03/22/2022   Stroke-like symptoms 03/01/2022   History of CVA (cerebrovascular accident) 02/28/2022   Aspiration pneumonia vs. 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   Carotid artery disease ([place]) 07/01/2012   Special screening for malignant neoplasm of prostate 05/08/2011   Vitamin D deficiency 11/20/2009   Transient cerebral ischemia 06/30/2009   CEREBROVASCULAR DISEASE 06/30/2009   COLONIC POLYPS 01/10/2008   Essential hypertension 01/10/2008   Coronary atherosclerosis 01/10/2008   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   GYNECOMASTIA, UNILATERAL 01/10/2008   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    PAIN:  Are you having pain? No  OBJECTIVE:   RECOMMENDATIONS FROM OBJECTIVE SWALLOW STUDY (MBSS/FEES):   regular diet Objective swallow impairments: reduced posterior bolus propulsion, reduced bolus cohesion, and a pharyngeal delay. Penetration (PAS 3, 5) was noted with thin and nectar thick liquids via cup and straw and aspiration (PAS 7, 8) was observed with consecutive swallows of thin liquids, with thin liquids via cup when the pill was taken and with consecutive swallows of nectar thick liquids. Instances of aspiration inconsistently triggered delayed coughing and this was noted most frequently with larger amounts of aspiration. Coughing was ineffective in expelling the aspirate, but cued coughing did expel penetrated material. With thin and nectar thick liquids, penetration and aspiration were eliminated with a chin tuck posture with left head turn and use of individual swallows. Laryngeal invasion was not observed with individual swallows of thin liquids when pt was cued to swallow on the count of three. Laryngeal invasion was also improved to a functional level of (PAS 2) with smaller boluses of thin liquids. No instances of penetration/aspiration were noted with honey thick liquids. Objective recommended compensations: cup, no straw; meds with puree, small bite/sips, slow rate  TODAY'S TREATMENT:   DYSPHAGIA TREATMENT:   Current diet: regular and honey thick liquids Swallow precautions: small bites, small sips, alternate bites and sips, chin tuck, head turn, stay upright 30 minutes post meal, hard swallow, no straws, and avoid mixed consistencies Patient directly observed with POs: Yes: honey thick liquids  Feeding: able to feed self Liquids provided by: straw  Oral phase signs and symptoms:  n/a Pharyngeal phase signs and symptoms:  none with PO trials this date Therapeutic exercises: Effortful Swallow, Masako, and EMST Types of cueing: verbal Amount of cueing: moderate Treatment comments: Adjusted EMST to 70 cm H2O this date d/t current setting becoming subjectively easier for pt. Pt rates  new setting as 6 on a 1-10 perceived effort scale. Able to complete x5 in a row at new setting. Completed additional exercises with occasional verbal A from [place]: Exercises: Effortful Swallow 20-30 repetitions, 3 x/day, Masako 10 repetitions, 3 x/day, Shaker 10 repetitions, 3 x/day, and EMST 10 repetitions, 2 x/day  PATIENT EDUCATION: Education details: see above Person educated: Patient and Spouse Education method: Explanation, Demonstration, Verbal cues, and Handouts Education comprehension: verbalized understanding, returned demonstration, and needs further education     ASSESSMENT:   CLINICAL IMPRESSION: Patient is a 81 y.o. M who was seen today for dysphagia and dysarthria. Instrumental swallow study demonstrates penetration and aspiration with thin and nectar thick liquids. Impairments in swallow include reduced posterior bolus propulsion, reduced bolus cohesion, and a pharyngeal delay. Chin tuck and head turn to left reduce laryngeal penetration, however pt with difficulty recalling to follow this protocol. Wife able to teach back compensations and strategies recommended by home health SLP: do no mix liquid with food, small bites/sips, not using meltable ice, avoiding certain foods, oral care. Wife, [name], is thickening all liquids for pt. Ongoing dysphagia exercises with high  compliance and subjective reported improvement in swallow function at home.    OBJECTIVE IMPAIRMENTS include dysarthria and dysphagia. These impairments are limiting patient from safety when swallowing and effectively communicating at home and in community. Factors affecting potential to achieve goals and functional outcome are ability to learn/carryover information and previous level of function. Patient will benefit from skilled SLP services to address above impairments and improve overall function.   REHAB POTENTIAL: Fair       GOALS: Goals reviewed with patient? Yes   SHORT TERM  GOALS: Target date: 06/14/2022     Pt and wife will report compliance with HEP frequency and repetitions with mod-I over 2 sessions. Baseline: Goal status: ongoing   2.  Pt and wife will report implementation of visual aids at home to support pt recall of targeted swallowing compensations and strategies over 2 sessions.  Baseline:  Goal status: ongoing   3.  Pt will teach back prescribed dysphagia exercises with rare min-A over 2 sessions Baseline:  Goal status: ongoing     LONG TERM GOALS: Target date: 07/12/2022   Pt and spouse will report ongoing compliance with prescribed HEP with mod-I over 1 week period Baseline:  Goal status: ongoing   2.  Using visual aids, pt with comply with targeted swallow precautions and compensations with no more than 2 verbal cues over 10 minute period.  Baseline:  Goal status: ongoing   3.  Pt will improve score on EAT-10 PROM by d/c, indicating subjective improvement in swallow function Baseline:  Goal status: ongoing   4.  Pt will participate in follow up MBSS following therapy course to objectively assess improvement in swallow function and safety.  Baseline:  Goal status: ongoing     PLAN: SLP FREQUENCY: 2x/week   SLP DURATION: 8 weeks   PLANNED INTERVENTIONS: Aspiration precaution training, Pharyngeal strengthening exercises, Diet toleration management , Trials of upgraded texture/liquids, SLP instruction and feedback, Compensatory strategies, Patient/family education, and Re-evaluation   Su Monks, [place] 06/06/2022, 2:04 PM

## 2022-06-06 NOTE — Patient Instructions (Signed)
Hard Swallow (30) Tongue Out (15) EMST (15)  Wed PM        Thu AM        Thu PM        Fri AM        Fri PM        Sat AM        Sat PM        Sun AM        Sun PM        Mon AM        Mon PM        Tue AM        Tue PM        Wed PM

## 2022-06-06 NOTE — Therapy (Signed)
OUTPATIENT PHYSICAL THERAPY TREATMENT NOTE   Patient Name: [name] [name] MRN: [id number] DOB:[birth year], 81 y.o., male Today's Date: 06/06/2022  PCP: Pleas Koch, NP, Josue Hector, MD REFERRING PROVIDER: Pleas Koch, NP    END OF SESSION:   PT End of Session - 06/06/22 1507     Visit Number 9    Number of Visits 16    Date for PT Re-Evaluation 06/27/22    Authorization Type [employer] - will need auth    Progress Note Due on Visit 10    PT Start Time 1320    PT Stop Time 1400    PT Time Calculation (min) 40 min    Equipment Utilized During Treatment Gait belt    Activity Tolerance Patient tolerated treatment well    Behavior During Therapy [facility] for tasks assessed/performed              Past Medical History:  Diagnosis Date   Basal cell carcinoma 11/09/2019   nod & infil-behind right ear-cx3 &exc   Basal cell carcinoma 03/21/2020   Residual BCC with peripheral margin involved - ST recommends [facility]   Carotid artery occlusion    Coronary artery disease    s/p CABG 2005; sees Dr Johnsie Cancel yearly   Diabetes mellitus without complication ([place])    Gynecomastia    Hypercholesterolemia    Hypertension    Neurodermatitis    Overweight(278.02)    Personal history of colonic polyps 10/08/2006   tubular adenomas   Renal insufficiency    Stroke [facility])    TIA  April 2022   Transient ischemic attack 2010   "lasted ~ 5 seconds"   Vitamin D deficiency    Past Surgical History:  Procedure Laterality Date   CARDIAC CATHETERIZATION  02/2004   "tried to stent; couldn't"   CAROTID ENDARTERECTOMY Right 08/26/2015   COLONOSCOPY     CORONARY ANGIOPLASTY     CORONARY ARTERY BYPASS GRAFT  Feb. 2005   4 vessel   ENDARTERECTOMY Right 08/26/2015   Procedure: Right Carotid ENDARTERECTOMY with Patch Angioplasty ;  Surgeon: Rosetta Posner, MD;  Location: [place];  Service: Vascular;  Laterality: Right;   ENDARTERECTOMY Left 05/18/2021   Procedure: LEFT CAROTID ARTERY  ENDARTERECTOMY  with patch angioplasty;  Surgeon: Rosetta Posner, MD;  Location: [place];  Service: Vascular;  Laterality: Left;   EYE SURGERY     bilateral cataract   KELOID EXCISION  04/2008   on chest scar; Dr. Dessie Coma   KELOID EXCISION     PILONIDAL CYST EXCISION  1989   Patient Active Problem List   Diagnosis Date Noted   Depression 03/22/2022   Stroke-like symptoms 03/01/2022   History of CVA (cerebrovascular accident) 02/28/2022   Aspiration pneumonia vs. 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   Carotid artery disease ([place]) 07/01/2012   Special screening for malignant neoplasm of prostate 05/08/2011   Vitamin D deficiency 11/20/2009   Transient cerebral ischemia 06/30/2009   CEREBROVASCULAR DISEASE 06/30/2009   COLONIC POLYPS  01/10/2008   Essential hypertension 01/10/2008   Coronary atherosclerosis 01/10/2008   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   GYNECOMASTIA, UNILATERAL 01/10/2008   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   SHOULDER PAIN 01/10/2008   Type 2 diabetes mellitus ([place]) 01/10/2008    REFERRING DIAG:  P53.61 (ICD-10-CM) - History of CVA (cerebrovascular accident) I63.81 (ICD-10-CM) - 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 I felt soreness in m stomach muscles L knee and Left arm. PAIN:  Are you having pain? No   OBJECTIVE: (objective measures completed at initial evaluation unless otherwise dated)       Gait speed: 0.55 m/s with RW 05/31/22 TUG: 26 sec with RW 05/31/22   TODAY'S TREATMENT:     Standing trunk twists: 10x R and L Standing knee touches: 10x Standing step to side with ipsilateral UE reach: 10x R and L Standing heel and toe raises: 2 x 10 without UE support Prone on elbow over countertop hip extensions: 2 x 10 R and L Shoft distance ambulation without AD: 2 x 10 feet, 1 x 80 feet with 5 instances of where he stubbed his toes on floor where he required min A and without min A he could have potentially fallen forward due to LOB. Pt asked to count how many times he stubs his toes for next walk to improve self awareness to improve self correction. 1 x 80 feet with 7-8 stumbles of feet which required min A Stairs: alternating feet, 2 UE support: 16 steps, pt did not pick up his feet well with turning at top of the 4 steps and required cueing to improve foot clearance.    Pt and wife educated not to practice walking without walker at home.        PATIENT EDUCATION: Education details: Continuing walking program w/proper AD management, continued practice of proper sit <>stand form  Person educated: Patient and Spouse Education method: Explanation  Education comprehension: verbalized understanding and needs further education      HOME EXERCISE PROGRAM: Access Code: [email or web address] URL: [email or web address] Date: 05/11/2022 Prepared by: Mickie Bail Plaster  Exercises - Sit to Stand Without Arm Support  - 1 x daily - 7 x weekly - 3 sets - 5-7 reps - Heel Raise on Step  - 1 x daily - 7 x weekly - 3 sets - 10 reps - Forward Step Down with Heel Tap and Rail Support  - 1 x daily - 7 x weekly - 3 sets - 10 reps        GOALS: Goals reviewed with patient? Yes   SHORT TERM GOALS: Target date: 05/30/2022   Patient will demo 0.59ms walking speed with walker to improve gait speed and community ambulation Baseline: 0.50 m/s with walker; 0.563m with walker(06/01/22)  Goal status: not met   2.  Patient will be able to perform sit to stand without HHA to improve functional strength Baseline: Requires 1-2 HHA from bed or chair to stand up (eval); able to do 2 reps without HHA but requires multiple attempt to stand up Goal status: Goal met.   3.  Patient will report 50% compliance with HEP to improve self management of symptoms  Baseline: HEP to be issued 2nd visit Goal status: goal met.     LONG TERM GOALS: Target date: 06/27/2022   Patient will demo gait speed of >0.71ms with LRAD to improve community ambulation and decrease fall risk Baseline: 0.5 m/s with walker; 0.55 m/s (06/01/22) Goal status: progressing   2.  Pt will demo BBS of >50/56 to reduce fall risk Baseline: 42/56 (05/02/22) Goal status: progressing   3.  Patient will demo TUG <15 seconds with LRAD to improve functional mobility Baseline: 21 sec with RW; 26 sec with RW (06/01/22) Goal status: Progressing    4. Patient will report 10 points of improvement on FOTO to improve overall function Baseline: 66 (05/02/22) Goal status: progressing   ASSESSMENT:   CLINICAL IMPRESSION: Pt continues to require cueing during ambulation to pick up his feet as he catches L>R foot during swing phase due to poor foot clearance bil. Pt is not safe to ambulate without AD at home independently      OBJECTIVE IMPAIRMENTS Abnormal gait, decreased activity tolerance, decreased balance, decreased endurance, decreased mobility, difficulty walking, decreased ROM, decreased strength, impaired flexibility, improper body mechanics, and postural dysfunction.    ACTIVITY LIMITATIONS cleaning, community activity, and driving.    PERSONAL FACTORS Age, Past/current  experiences, Time since onset of injury/illness/exacerbation, and 1-2 comorbidities: TN, HLD, DMII, CAD, carotid artery disease s/p R CEA and history of multiple strokes  are also affecting patient's functional outcome.      REHAB POTENTIAL: Good   CLINICAL DECISION MAKING: Stable/uncomplicated   EVALUATION COMPLEXITY: Low   PLAN: PT FREQUENCY: 2x/week   PT DURATION: 8 weeks   PLANNED INTERVENTIONS: Therapeutic exercises, Therapeutic activity, Neuromuscular re-education, Balance training, Gait training, Patient/Family education, Joint mobilization, Stair training, Orthotic/Fit training, Cryotherapy, Moist heat, and Manual therapy   PLAN FOR NEXT SESSION: HEP is going good. Work on closed [employer] activation ( R weaker compared to Left) .   KKerrie Pleasure PT 06/06/2022, 3:08 PM

## 2022-06-08 ENCOUNTER — Ambulatory Visit: Payer: Medicare PPO

## 2022-06-08 DIAGNOSIS — R2681 Unsteadiness on feet: Secondary | ICD-10-CM | POA: Diagnosis not present

## 2022-06-08 DIAGNOSIS — M6281 Muscle weakness (generalized): Secondary | ICD-10-CM

## 2022-06-08 DIAGNOSIS — R1312 Dysphagia, oropharyngeal phase: Secondary | ICD-10-CM | POA: Diagnosis not present

## 2022-06-08 DIAGNOSIS — R2689 Other abnormalities of gait and mobility: Secondary | ICD-10-CM | POA: Diagnosis not present

## 2022-06-08 DIAGNOSIS — R1310 Dysphagia, unspecified: Secondary | ICD-10-CM | POA: Diagnosis not present

## 2022-06-08 NOTE — Therapy (Signed)
OUTPATIENT PHYSICAL THERAPY Progress NOTE   Patient Name: [name] [name] MRN: [id number] DOB:October 26, 1941, 81 y.o., male Today's Date: 06/08/2022  PCP: Pleas Koch, NP, Josue Hector, MD REFERRING PROVIDER: Pleas Koch, NP    END OF SESSION:   PT End of Session - 06/08/22 1320     Visit Number 10    Number of Visits 16    Date for PT Re-Evaluation 06/27/22    Authorization Type [employer] - will need auth    Progress Note Due on Visit 10    PT Start Time 1315    PT Stop Time 1400    PT Time Calculation (min) 45 min    Equipment Utilized During Treatment Gait belt    Activity Tolerance Patient tolerated treatment well    Behavior During Therapy [facility] for tasks assessed/performed              Past Medical History:  Diagnosis Date   Basal cell carcinoma 11/09/2019   nod & infil-behind right ear-cx3 &exc   Basal cell carcinoma 03/21/2020   Residual BCC with peripheral margin involved - ST recommends [facility]   Carotid artery occlusion    Coronary artery disease    s/p CABG 2005; sees Dr Johnsie Cancel yearly   Diabetes mellitus without complication ([place])    Gynecomastia    Hypercholesterolemia    Hypertension    Neurodermatitis    Overweight(278.02)    Personal history of colonic polyps 10/08/2006   tubular adenomas   Renal insufficiency    Stroke [facility])    TIA  April 2022   Transient ischemic attack 2010   "lasted ~ 5 seconds"   Vitamin D deficiency    Past Surgical History:  Procedure Laterality Date   CARDIAC CATHETERIZATION  02/2004   "tried to stent; couldn't"   CAROTID ENDARTERECTOMY Right 08/26/2015   COLONOSCOPY     CORONARY ANGIOPLASTY     CORONARY ARTERY BYPASS GRAFT  Feb. 2005   4 vessel   ENDARTERECTOMY Right 08/26/2015   Procedure: Right Carotid ENDARTERECTOMY with Patch Angioplasty ;  Surgeon: Rosetta Posner, MD;  Location: [place];  Service: Vascular;  Laterality: Right;   ENDARTERECTOMY Left 05/18/2021   Procedure: LEFT CAROTID ARTERY  ENDARTERECTOMY  with patch angioplasty;  Surgeon: Rosetta Posner, MD;  Location: [place];  Service: Vascular;  Laterality: Left;   EYE SURGERY     bilateral cataract   KELOID EXCISION  04/2008   on chest scar; Dr. Dessie Coma   KELOID EXCISION     PILONIDAL CYST EXCISION  1989   Patient Active Problem List   Diagnosis Date Noted   Depression 03/22/2022   Stroke-like symptoms 03/01/2022   History of CVA (cerebrovascular accident) 02/28/2022   Aspiration pneumonia vs. 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   Carotid artery disease ([place]) 07/01/2012   Special screening for malignant neoplasm of prostate 05/08/2011   Vitamin D deficiency 11/20/2009   Transient cerebral ischemia 06/30/2009   CEREBROVASCULAR DISEASE 06/30/2009   COLONIC POLYPS  01/10/2008   Essential hypertension 01/10/2008   Coronary atherosclerosis 01/10/2008   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   GYNECOMASTIA, UNILATERAL 01/10/2008   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   SHOULDER PAIN 01/10/2008   Type 2 diabetes mellitus ([place]) 01/10/2008    REFERRING DIAG:  O32.91 (ICD-10-CM) - History of CVA (cerebrovascular accident) I63.81 (ICD-10-CM) - 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 I felt soreness in m stomach muscles L knee and Left arm. PAIN:  Are you having pain? No   OBJECTIVE: (objective measures completed at initial evaluation unless otherwise dated)       Gait speed: 0.55 m/s with RW 05/31/22 TUG: 26 sec with RW 05/31/22; 23 sec with [facility] Cane (06/08/22)   TODAY'S TREATMENT:   Reassessment: BBS and TUG performed. Gait training: 2 x 230' st. Cane cues for high knee marches, pt had 3 instnces of toes catching during each walk, with moderate to max cueing for high marches. Walking figure 8 around 4 cones with 180 deg turn: 4x,  Walking on 5 feet, 1 inch mat: 6x with stepping on and off with 180 deg turns with st. [name]        PATIENT EDUCATION: Education details: Continuing walking program w/proper AD management, continued practice of proper sit <>stand form  Person educated: Patient and Spouse Education method: Explanation  Education comprehension: verbalized understanding and needs further education      HOME EXERCISE PROGRAM: Access Code: [email or web address] URL: [email or web address] Date: 05/11/2022 Prepared by: Mickie Bail Plaster  Exercises - Sit to Stand Without Arm Support  - 1 x daily - 7 x weekly - 3 sets - 5-7 reps - Heel Raise on Step  - 1 x daily - 7 x weekly - 3 sets - 10 reps - Forward Step Down with Heel Tap and Rail Support  - 1 x daily - 7 x weekly - 3 sets - 10 reps       GOALS: Goals reviewed with patient? Yes   SHORT TERM GOALS: Target date: 05/30/2022   Patient will demo 0.6ms walking speed with walker to improve gait speed and community ambulation Baseline: 0.50 m/s with walker; 0.569m with walker(06/01/22)  Goal status: not met   2.  Patient will be able to perform sit to stand without HHA to improve functional strength Baseline: Requires 1-2 HHA from bed or chair to stand up (eval); able to do 2 reps without HHA but requires multiple attempt to  stand up Goal status: Goal met.   3.  Patient will report 50% compliance with HEP to improve self management of symptoms Baseline: HEP to be issued 2nd visit Goal status: goal met.     LONG TERM GOALS: Target date: 06/27/2022   Patient will demo gait speed of >0.77m29mwith LRAD to improve community ambulation and decrease fall risk Baseline: 0.5 m/s with walker; 0.55 m/s (06/01/22) Goal status: progressing   2.  Pt will demo BBS of >50/56 to reduce fall risk Baseline: 42/56 (05/02/22); 50/56 (06/08/22) Goal status: goal met   3.  Patient will demo TUG <15 seconds with LRAD to improve functional mobility Baseline: 21 sec with RW; 26 sec with RW (  06/01/22); 23 sec with st. Cane (06/02/22) Goal status: Progressing    4. Patient will report 10 points of improvement on FOTO to improve overall function Baseline: 54 (06/08/22) Goal status: progressing   ASSESSMENT:   CLINICAL IMPRESSION: Pt has been seen for total of 10 sessions from 05/02/22 to 06/08/22 for gait and mobility disorder. He has met all of his short term goals and making progress towards long term goals. We are progressing patient from RW to LRAD as tolerated and for safety      OBJECTIVE IMPAIRMENTS Abnormal gait, decreased activity tolerance, decreased balance, decreased endurance, decreased mobility, difficulty walking, decreased ROM, decreased strength, impaired flexibility, improper body mechanics, and postural dysfunction.    ACTIVITY LIMITATIONS cleaning, community activity, and driving.    PERSONAL FACTORS Age, Past/current experiences, Time since onset of injury/illness/exacerbation, and 1-2 comorbidities: TN, HLD, DMII, CAD, carotid artery disease s/p R CEA and history of multiple strokes  are also affecting patient's functional outcome.      REHAB POTENTIAL: Good   CLINICAL DECISION MAKING: Stable/uncomplicated   EVALUATION COMPLEXITY: Low   PLAN: PT FREQUENCY: 2x/week   PT DURATION: 8 weeks   PLANNED INTERVENTIONS:  Therapeutic exercises, Therapeutic activity, Neuromuscular re-education, Balance training, Gait training, Patient/Family education, Joint mobilization, Stair training, Orthotic/Fit training, Cryotherapy, Moist heat, and Manual therapy   PLAN FOR NEXT SESSION: HEP is going good. Work on closed [employer] activation ( R weaker compared to Left) .   [name], PT 06/08/2022, 1:20 PM

## 2022-06-08 NOTE — Therapy (Signed)
OUTPATIENT SPEECH LANGUAGE PATHOLOGY DYSPHAGIA TREATMENT NOTE   Patient Name: [name] [name] MRN: [id number] DOB:May 26, 1941, 81 y.o., male Today's Date: 06/08/2022  PCP: Pleas Koch, NP  REFERRING PROVIDER: Pleas Koch, NP   END OF SESSION:   End of Session - 06/08/22 1228     Visit Number 7    Number of Visits 17    Date for SLP Re-Evaluation 07/12/22    Authorization Type [employer]    SLP Start Time 5    SLP Stop Time  8032    SLP Time Calculation (min) 45 min    Activity Tolerance Patient tolerated treatment well                  Past Medical History:  Diagnosis Date   Basal cell carcinoma 11/09/2019   nod & infil-behind right ear-cx3 &exc   Basal cell carcinoma 03/21/2020   Residual BCC with peripheral margin involved - ST recommends [facility]   Carotid artery occlusion    Coronary artery disease    s/p CABG 2005; sees Dr Johnsie Cancel yearly   Diabetes mellitus without complication ([place])    Gynecomastia    Hypercholesterolemia    Hypertension    Neurodermatitis    Overweight(278.02)    Personal history of colonic polyps 10/08/2006   tubular adenomas   Renal insufficiency    Stroke [facility])    TIA  April 2022   Transient ischemic attack 2010   "lasted ~ 5 seconds"   Vitamin D deficiency    Past Surgical History:  Procedure Laterality Date   CARDIAC CATHETERIZATION  02/2004   "tried to stent; couldn't"   CAROTID ENDARTERECTOMY Right 08/26/2015   COLONOSCOPY     CORONARY ANGIOPLASTY     CORONARY ARTERY BYPASS GRAFT  Feb. 2005   4 vessel   ENDARTERECTOMY Right 08/26/2015   Procedure: Right Carotid ENDARTERECTOMY with Patch Angioplasty ;  Surgeon: Rosetta Posner, MD;  Location: [place];  Service: Vascular;  Laterality: Right;   ENDARTERECTOMY Left 05/18/2021   Procedure: LEFT CAROTID ARTERY ENDARTERECTOMY  with patch angioplasty;  Surgeon: Rosetta Posner, MD;  Location: [place];  Service: Vascular;  Laterality: Left;   EYE SURGERY     bilateral  cataract   KELOID EXCISION  04/2008   on chest scar; Dr. Dessie Coma   KELOID EXCISION     PILONIDAL CYST EXCISION  1989   Patient Active Problem List   Diagnosis Date Noted   Depression 03/22/2022   Stroke-like symptoms 03/01/2022   History of CVA (cerebrovascular accident) 02/28/2022   Aspiration pneumonia vs. 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   Carotid artery disease ([place]) 07/01/2012   Special screening for malignant neoplasm of prostate 05/08/2011   Vitamin D deficiency 11/20/2009   Transient cerebral ischemia 06/30/2009   CEREBROVASCULAR DISEASE 06/30/2009   COLONIC POLYPS 01/10/2008   Essential hypertension 01/10/2008   Coronary atherosclerosis 01/10/2008   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 I do them some (re: HEP)"   PAIN:  Are you having pain? No  OBJECTIVE:   RECOMMENDATIONS FROM OBJECTIVE SWALLOW STUDY (MBSS/FEES):   regular diet Objective swallow impairments: reduced posterior bolus propulsion, reduced bolus cohesion, and a pharyngeal delay. Penetration (PAS 3, 5) was noted with thin and nectar thick liquids via cup and straw and aspiration (PAS 7, 8) was observed with consecutive swallows of thin liquids, with thin liquids via cup when the pill was taken and with consecutive swallows of nectar thick liquids. Instances of aspiration inconsistently triggered delayed coughing and this was noted most frequently with larger amounts of aspiration. Coughing was ineffective in expelling the aspirate, but cued coughing did expel penetrated material. With thin and nectar thick liquids, penetration and aspiration were eliminated with a chin tuck posture with left head turn and use of individual swallows. Laryngeal invasion was not observed with individual swallows of thin liquids when pt was cued to swallow on the count of three. Laryngeal invasion was also improved to a functional level of (PAS 2) with smaller boluses of thin liquids. No instances of penetration/aspiration were noted with honey thick liquids. Objective recommended compensations: cup, no straw; meds with puree, small bite/sips, slow rate  TODAY'S TREATMENT:   DYSPHAGIA TREATMENT:   Current diet: regular and honey thick liquids Swallow precautions: small bites, small sips, alternate bites and sips, chin tuck, head turn, stay upright 30 minutes post meal, hard swallow, no straws, and avoid mixed consistencies Patient directly observed with POs: Yes: thin liquids  Feeding: able to feed self Liquids provided by: cup  Oral phase signs and symptoms:  n/a Pharyngeal phase signs and symptoms: delayed throat clear and delayed cough Therapeutic exercises: EMST Types of cueing: verbal Amount of cueing: moderate Treatment comments: Pt reported "some" completion of recommended dysphagia HEP. Pt able to recall targeted exercises and denied difficulty with completion,  only reduced motivation despite education and tracking chart provided by primary SLP. SLP provided additional education and encouragement to hopefully maximize completion of targeted exercises to optimize swallow function. SLP also educated importance of thorough oral care for bacteria management. Pt verbalized understanding and agreement. SLP trialed thin liquids via small, single cup sips with use of recommended strategies from MBSS (chin tuck/head tilt left & oral hold bolus for 3 seconds). Usual cues required to carryover compensations. Limited trials resulted in consistent delayed cough with bolus hold technique. Subjectively weak cough strength noted, in which SLP cued and modeled increased cough intensity. Pt able to demo with min A. Occasional delayed throat clearing exhibited through remainder of session. SLP targeted EMST trials, in which usual fading to occasional cues required to utilize nose clip, take large inhale via mouth, securely seal lips on device, and produce strong, short exhalations. Pt completed reps x15 of EMST today.   HOME EXERCISE PROGRAM: Exercises: Effortful Swallow 20-30 repetitions, 3 x/day, Masako 10 repetitions, 3 x/day, Shaker 10 repetitions, 3 x/day, and EMST 10 repetitions, 2 x/day  PATIENT EDUCATION: Education details: see above Person educated: Patient and Spouse Education method: Explanation, Media planner, Verbal cues, and Handouts Education  comprehension: verbalized understanding, returned demonstration, and needs further education     ASSESSMENT:   CLINICAL IMPRESSION: Patient is a 81 y.o. M who was seen today for dysphagia and dysarthria. Instrumental swallow study demonstrates penetration and aspiration with thin and nectar thick liquids. Impairments in swallow include reduced posterior bolus propulsion, reduced bolus cohesion, and a pharyngeal delay. Chin tuck and head turn to left reduce laryngeal penetration, however pt with difficulty recalling to  follow this protocol.  Wife, [name], is thickening all liquids for pt. Ongoing education and training of dysphagia exercises completed, with inconsistent compliance and subjective reported improvement in swallow function at home.    OBJECTIVE IMPAIRMENTS include dysarthria and dysphagia. These impairments are limiting patient from safety when swallowing and effectively communicating at home and in community. Factors affecting potential to achieve goals and functional outcome are ability to learn/carryover information and previous level of function. Patient will benefit from skilled SLP services to address above impairments and improve overall function.   REHAB POTENTIAL: Fair       GOALS: Goals reviewed with patient? Yes   SHORT TERM GOALS: Target date: 06/14/2022     Pt and wife will report compliance with HEP frequency and repetitions with mod-I over 2 sessions. Baseline: Goal status: ongoing   2.  Pt and wife will report implementation of visual aids at home to support pt recall of targeted swallowing compensations and strategies over 2 sessions.  Baseline:  Goal status: ongoing   3.  Pt will teach back prescribed dysphagia exercises with rare min-A over 2 sessions Baseline: 06-08-22 Goal status: ongoing     LONG TERM GOALS: Target date: 07/12/2022   Pt and spouse will report ongoing compliance with prescribed HEP with mod-I over 1 week period Baseline:  Goal status: ongoing   2.  Using visual aids, pt with comply with targeted swallow precautions and compensations with no more than 2 verbal cues over 10 minute period.  Baseline:  Goal status: ongoing   3.  Pt will improve score on EAT-10 PROM by d/c, indicating subjective improvement in swallow function Baseline:  Goal status: ongoing   4.  Pt will participate in follow up MBSS following therapy course to objectively assess improvement in swallow function and safety.  Baseline:  Goal status: ongoing     PLAN: SLP  FREQUENCY: 2x/week   SLP DURATION: 8 weeks   PLANNED INTERVENTIONS: Aspiration precaution training, Pharyngeal strengthening exercises, Diet toleration management , Trials of upgraded texture/liquids, SLP instruction and feedback, Compensatory strategies, Patient/family education, and Re-evaluation   Marzetta Board, CCC-SLP 06/08/2022, 12:29 PM

## 2022-06-13 ENCOUNTER — Ambulatory Visit: Payer: Medicare PPO | Admitting: Speech Pathology

## 2022-06-13 ENCOUNTER — Ambulatory Visit: Payer: Medicare PPO | Admitting: Physical Therapy

## 2022-06-13 VITALS — BP 155/76 | HR 68

## 2022-06-13 DIAGNOSIS — R1310 Dysphagia, unspecified: Secondary | ICD-10-CM | POA: Diagnosis not present

## 2022-06-13 DIAGNOSIS — R2689 Other abnormalities of gait and mobility: Secondary | ICD-10-CM

## 2022-06-13 DIAGNOSIS — R1312 Dysphagia, oropharyngeal phase: Secondary | ICD-10-CM | POA: Diagnosis not present

## 2022-06-13 DIAGNOSIS — M6281 Muscle weakness (generalized): Secondary | ICD-10-CM | POA: Diagnosis not present

## 2022-06-13 DIAGNOSIS — R2681 Unsteadiness on feet: Secondary | ICD-10-CM | POA: Diagnosis not present

## 2022-06-13 NOTE — Patient Instructions (Signed)
Hard Swallow (30) Tongue Out (15) EMST (15)  Wed PM        Thu AM        Thu PM        Fri AM        Fri PM        Sat AM        Sat PM        Sun AM        Sun PM        Mon AM        Mon PM        Tue AM        Tue PM        Wed PM

## 2022-06-13 NOTE — Therapy (Signed)
OUTPATIENT SPEECH LANGUAGE PATHOLOGY DYSPHAGIA TREATMENT NOTE   Patient Name: [name] [name] MRN: [id number] DOB:02/13/1941, 81 y.o., male Today's Date: 06/13/2022  PCP: Pleas Koch, NP  REFERRING PROVIDER: Pleas Koch, NP   END OF SESSION:   End of Session - 06/13/22 1334     Visit Number 8    Number of Visits 17    Date for SLP Re-Evaluation 07/12/22    Authorization Type [employer]    Progress Note Due on Visit 10    SLP Start Time 1407    SLP Stop Time  1443    SLP Time Calculation (min) 36 min    Activity Tolerance Patient tolerated treatment well                   Past Medical History:  Diagnosis Date   Basal cell carcinoma 11/09/2019   nod & infil-behind right ear-cx3 &exc   Basal cell carcinoma 03/21/2020   Residual BCC with peripheral margin involved - ST recommends [facility]   Carotid artery occlusion    Coronary artery disease    s/p CABG 2005; sees Dr Johnsie Cancel yearly   Diabetes mellitus without complication ([place])    Gynecomastia    Hypercholesterolemia    Hypertension    Neurodermatitis    Overweight(278.02)    Personal history of colonic polyps 10/08/2006   tubular adenomas   Renal insufficiency    Stroke [facility])    TIA  April 2022   Transient ischemic attack 2010   "lasted ~ 5 seconds"   Vitamin D deficiency    Past Surgical History:  Procedure Laterality Date   CARDIAC CATHETERIZATION  02/2004   "tried to stent; couldn't"   CAROTID ENDARTERECTOMY Right 08/26/2015   COLONOSCOPY     CORONARY ANGIOPLASTY     CORONARY ARTERY BYPASS GRAFT  Feb. 2005   4 vessel   ENDARTERECTOMY Right 08/26/2015   Procedure: Right Carotid ENDARTERECTOMY with Patch Angioplasty ;  Surgeon: Rosetta Posner, MD;  Location: [place];  Service: Vascular;  Laterality: Right;   ENDARTERECTOMY Left 05/18/2021   Procedure: LEFT CAROTID ARTERY ENDARTERECTOMY  with patch angioplasty;  Surgeon: Rosetta Posner, MD;  Location: [place];  Service: Vascular;  Laterality:  Left;   EYE SURGERY     bilateral cataract   KELOID EXCISION  04/2008   on chest scar; Dr. Dessie Coma   KELOID EXCISION     PILONIDAL CYST EXCISION  1989   Patient Active Problem List   Diagnosis Date Noted   Depression 03/22/2022   Stroke-like symptoms 03/01/2022   History of CVA (cerebrovascular accident) 02/28/2022   Aspiration pneumonia vs. 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   Carotid artery disease ([place]) 07/01/2012   Special screening for malignant neoplasm of prostate 05/08/2011   Vitamin D deficiency 11/20/2009   Transient cerebral ischemia 06/30/2009   CEREBROVASCULAR DISEASE 06/30/2009   COLONIC POLYPS 01/10/2008   Essential hypertension 01/10/2008   Coronary atherosclerosis 01/10/2008   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   GYNECOMASTIA,  UNILATERAL 01/10/2008   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   SHOULDER PAIN 01/10/2008   Type 2 diabetes mellitus ([place]) 01/10/2008    ONSET DATE: 02/28/2022    REFERRING DIAG: O16.07 (ICD-10-CM) - History of CVA (cerebrovascular accident) R13.10 (ICD-10-CM) - Dysphagia, unspecified type J69.0 (ICD-10-CM) - Aspiration pneumonia, unspecified aspiration pneumonia type, unspecified laterality, unspecified part of lung ([place])   THERAPY DIAG: Dysphagia, oropharyngeal phase  SUBJECTIVE:   SUBJECTIVE STATEMENT: "I'm doing pretty good"   PAIN:  Are you having pain? No  OBJECTIVE:   RECOMMENDATIONS FROM OBJECTIVE SWALLOW STUDY (MBSS/FEES):   regular diet Objective swallow impairments: reduced posterior bolus propulsion, reduced bolus cohesion, and a pharyngeal delay. Penetration (PAS 3, 5) was noted with thin and nectar thick liquids via cup and straw and aspiration (PAS 7, 8) was observed with consecutive swallows of thin liquids, with thin liquids via cup when the pill was taken and with consecutive swallows of nectar thick liquids. Instances of aspiration inconsistently triggered delayed coughing and this was noted most frequently with larger amounts of aspiration. Coughing was ineffective in expelling the aspirate, but cued coughing did expel penetrated material. With thin and nectar thick liquids, penetration and aspiration were eliminated with a chin tuck posture with left head turn and use of individual swallows. Laryngeal invasion was not observed with individual swallows of thin liquids when pt was cued to swallow on the count of three. Laryngeal invasion was also improved to a functional level of (PAS 2) with smaller boluses of thin liquids. No instances of penetration/aspiration were noted with honey thick liquids. Objective recommended compensations: cup, no straw; meds with puree, small bite/sips, slow rate  TODAY'S TREATMENT:   DYSPHAGIA TREATMENT:   Current diet: regular and honey thick liquids Swallow precautions: small bites, small sips, alternate bites and sips, chin tuck, head turn, stay upright 30 minutes post meal, hard swallow, no straws, and avoid mixed consistencies Patient directly observed with POs: Yes: honey thick liquids  Feeding: able to feed self Liquids provided by: straw  Oral phase signs and symptoms:  n/a Pharyngeal phase signs and symptoms: delayed throat clear Therapeutic exercises: Effortful Swallow, Masako, and EMST Types of cueing: verbal Amount of cueing: minimal Treatment comments: Pt able to teach back trained swallowing exercises with min-A. Reports intermittent compliance with HEP. Generated  strategy of using alarm to alert pt to completion of exercises. Set reoccurring BID alarm with note for exercise completion. Pt agreeable to doing AM iteration during commercial breaks of [employer].  Demonstrated how alarm would work, pt able to silence in therapy session.   HOME EXERCISE PROGRAM: Exercises: Effortful Swallow 20-30 repetitions, 3 x/day, Masako 10 repetitions, 3 x/day, Shaker 10 repetitions, 3 x/day, and EMST 10 repetitions, 2 x/day  PATIENT EDUCATION: Education details: see above Person educated: Patient and Spouse Education method: Explanation, Demonstration, Verbal cues, and Handouts Education comprehension: verbalized understanding, returned demonstration, and needs further education     ASSESSMENT:   CLINICAL IMPRESSION: Patient is a 81 y.o. M who was seen today for dysphagia and dysarthria. Instrumental swallow study demonstrates penetration and aspiration with thin and nectar thick liquids. Impairments in swallow include reduced posterior bolus propulsion, reduced bolus cohesion, and a pharyngeal delay. Chin tuck and head turn to left reduce laryngeal penetration, however pt with difficulty recalling to follow this protocol.  Wife, [name], is thickening all liquids for pt. Ongoing education and training of dysphagia exercises completed, with inconsistent compliance and subjective reported improvement in swallow function at  home.    OBJECTIVE IMPAIRMENTS include dysarthria and dysphagia. These impairments are limiting patient from safety when swallowing and effectively communicating at home and in community. Factors affecting potential to achieve goals and functional outcome are ability to learn/carryover information and previous level of function. Patient will benefit from skilled SLP services to address above impairments and improve overall function.   REHAB POTENTIAL: Fair       GOALS: Goals reviewed with patient? Yes   SHORT TERM GOALS: Target date: 06/14/2022      Pt and wife will report compliance with HEP frequency and repetitions with mod-I over 2 sessions. Baseline:  Goal status: ongoing   2.  Pt and wife will report implementation of visual aids at home to support pt recall of targeted swallowing compensations and strategies over 2 sessions.  Baseline:  Goal status: ongoing   3.  Pt will teach back prescribed dysphagia exercises with rare min-A over 2 sessions Baseline: 06-08-22 Goal status: ongoing     LONG TERM GOALS: Target date: 07/12/2022   Pt and spouse will report ongoing compliance with prescribed HEP with mod-I over 1 week period Baseline:  Goal status: ongoing   2.  Using visual aids, pt with comply with targeted swallow precautions and compensations with no more than 2 verbal cues over 10 minute period.  Baseline:  Goal status: ongoing   3.  Pt will improve score on EAT-10 PROM by d/c, indicating subjective improvement in swallow function Baseline:  Goal status: ongoing   4.  Pt will participate in follow up MBSS following therapy course to objectively assess improvement in swallow function and safety.  Baseline:  Goal status: ongoing     PLAN: SLP FREQUENCY: 2x/week   SLP DURATION: 8 weeks   PLANNED INTERVENTIONS: Aspiration precaution training, Pharyngeal strengthening exercises, Diet toleration management , Trials of upgraded texture/liquids, SLP instruction and feedback, Compensatory strategies, Patient/family education, and Re-evaluation   Su Monks, [place] 06/13/2022, 2:43 PM

## 2022-06-13 NOTE — Therapy (Addendum)
OUTPATIENT PHYSICAL THERAPY TREATMENT NOTE   Patient Name: [name] [name] MRN: [id number] DOB:08/28/1941, 81 y.o., male Today's Date: 06/13/2022  PCP: Pleas Koch, NP, Josue Hector, MD REFERRING PROVIDER: Pleas Koch, NP    END OF SESSION:   PT End of Session - 06/13/22 1318     Visit Number 11    Number of Visits 16    Date for PT Re-Evaluation 06/27/22    Authorization Type [employer] - will need auth    Progress Note Due on Visit 10    PT Start Time 1317    PT Stop Time 1406    PT Time Calculation (min) 49 min    Equipment Utilized During Treatment --    Activity Tolerance Patient tolerated treatment well    Behavior During Therapy [facility] for tasks assessed/performed;Flat affect               Past Medical History:  Diagnosis Date   Basal cell carcinoma 11/09/2019   nod & infil-behind right ear-cx3 &exc   Basal cell carcinoma 03/21/2020   Residual BCC with peripheral margin involved - ST recommends [facility]   Carotid artery occlusion    Coronary artery disease    s/p CABG 2005; sees Dr Johnsie Cancel yearly   Diabetes mellitus without complication ([place])    Gynecomastia    Hypercholesterolemia    Hypertension    Neurodermatitis    Overweight(278.02)    Personal history of colonic polyps 10/08/2006   tubular adenomas   Renal insufficiency    Stroke [facility])    TIA  April 2022   Transient ischemic attack 2010   "lasted ~ 5 seconds"   Vitamin D deficiency    Past Surgical History:  Procedure Laterality Date   CARDIAC CATHETERIZATION  02/2004   "tried to stent; couldn't"   CAROTID ENDARTERECTOMY Right 08/26/2015   COLONOSCOPY     CORONARY ANGIOPLASTY     CORONARY ARTERY BYPASS GRAFT  Feb. 2005   4 vessel   ENDARTERECTOMY Right 08/26/2015   Procedure: Right Carotid ENDARTERECTOMY with Patch Angioplasty ;  Surgeon: Rosetta Posner, MD;  Location: [place];  Service: Vascular;  Laterality: Right;   ENDARTERECTOMY Left 05/18/2021   Procedure: LEFT CAROTID  ARTERY ENDARTERECTOMY  with patch angioplasty;  Surgeon: Rosetta Posner, MD;  Location: [place];  Service: Vascular;  Laterality: Left;   EYE SURGERY     bilateral cataract   KELOID EXCISION  04/2008   on chest scar; Dr. Dessie Coma   KELOID EXCISION     PILONIDAL CYST EXCISION  1989   Patient Active Problem List   Diagnosis Date Noted   Depression 03/22/2022   Stroke-like symptoms 03/01/2022   History of CVA (cerebrovascular accident) 02/28/2022   Aspiration pneumonia vs. 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   Carotid artery disease ([place]) 07/01/2012   Special screening for malignant neoplasm of prostate 05/08/2011   Vitamin D deficiency 11/20/2009   Transient cerebral ischemia 06/30/2009   CEREBROVASCULAR DISEASE 06/30/2009   COLONIC  POLYPS 01/10/2008   Essential hypertension 01/10/2008   Coronary atherosclerosis 01/10/2008   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   GYNECOMASTIA, UNILATERAL 01/10/2008   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   SHOULDER PAIN 01/10/2008   Type 2 diabetes mellitus ([place]) 01/10/2008    REFERRING DIAG:  X52.84 (ICD-10-CM) - History of CVA (cerebrovascular accident) I63.81 (ICD-10-CM) - 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 Pt's wife reports it is like "pulling teeth" to get pt to perform exercises at home.   PAIN:  Are you having pain? No  Today's Vitals   06/13/22 1348  BP: (!) 155/76  Pulse: 68     OBJECTIVE: (objective measures completed at initial evaluation unless otherwise dated)    TODAY'S TREATMENT:   Self-care/home management  Entirety of session spent discussing limitations to progress in therapy, as pt not performing exercises at home (per wife) "correctly". Pt verbalized frustration w/ negative reinforcement ("I always do every thing wrong"). Lengthy discussion regarding pt finding activities he can perform alone safely for improved mental health and alone time.   Pt inquiring about why he cannot drive, provided education regarding safety concerns and pt's impaired memory/reaction time. Informed pt that it is up to the neurologist's discretion as to when he can return to drive. Pt verbalized understanding.   Pt's wife inquiring about pt's BP, assessed BP and although systolic number elevated, vitals WNL. Pt's wife worried about her home BP machine not being calibrated.   Pt and wife inquiring about extending PT POC and whether pt is safe to transfer by himself. Informed pt that therapist would speak to primary PT prior to answering question, as he has worked with pt more. Pt and wife verbalized understanding.   Lastly, discussed importance of pt's mental health and correlation between mental and physical health. Pt became upset, stating "if I feel like I need medicine, I will ask for it myself". Pt and wife not in agreement.   Encouraged pt and wife to find time to be separated from each other in order to take time for themselves. Also encouraged wife to alter her cueing of pt to focus on the positive rather than the negative. Pt and wife stated they would work on it.    PATIENT EDUCATION: Education details:  See above  Person educated: Patient and Spouse Education method: Explanation  Education comprehension: verbalized understanding and needs further education      HOME EXERCISE PROGRAM: Access Code: [email or web address] URL: [email or web address] Date: 05/11/2022 Prepared by: Mickie Bail Avo Schlachter  Exercises - Sit to Stand Without Arm Support  - 1 x daily - 7 x weekly - 3 sets - 5-7 reps - Heel Raise on Step  - 1 x daily - 7 x weekly - 3 sets - 10 reps - Forward Step Down with Heel Tap and Rail Support  - 1 x daily - 7 x weekly - 3 sets - 10 reps       GOALS: Goals reviewed with patient? Yes   SHORT TERM GOALS: Target date: 05/30/2022   Patient will demo 0.25ms walking speed with walker to improve gait speed and community ambulation Baseline: 0.50 m/s with walker; 0.576m  with walker(06/01/22)  Goal status: not met   2.  Patient will be able to perform sit to stand without HHA to improve functional strength Baseline: Requires 1-2 HHA from bed or chair to stand up (eval); able to do 2 reps without HHA but requires multiple attempt to stand up Goal status: Goal met.   3.  Patient will report 50% compliance with HEP to improve self management of symptoms Baseline: HEP to be issued 2nd visit Goal status: goal met.     LONG TERM GOALS: Target date: 06/27/2022   Patient will demo gait speed of >0.4ms with LRAD to improve community ambulation and decrease fall risk Baseline: 0.5 m/s with walker; 0.55 m/s (06/01/22) Goal status: progressing   2.  Pt will demo BBS of >50/56 to reduce fall risk Baseline: 42/56 (05/02/22); 50/56 (06/08/22) Goal status: goal met   3.  Patient will demo TUG <15 seconds with LRAD to improve functional mobility Baseline: 21 sec with RW; 26 sec with RW (06/01/22); 23 sec with st. Cane (06/02/22) Goal status: Progressing    4. Patient will report 10 points of improvement on FOTO to improve overall function Baseline: 54 (06/08/22) Goal status: progressing    ASSESSMENT:   CLINICAL IMPRESSION: Emphasis of skilled PT session on providing therapeutic listening to therapist and wife regarding barriers for improvement at home and frustrations following newest CVA. Pt appeared very sad today w/flat affect and verbalized frequent frustrations regarding performing exercise at home and losing independence with most recent CVA. Provided encouragement as appropriate and education regarding safety concerns and importance of consistency with exercise for functional gains. Pt verbalized understanding but demonstrates poor recall/retention between therapy sessions. Continue POC.      OBJECTIVE IMPAIRMENTS Abnormal gait, decreased activity tolerance, decreased balance, decreased endurance, decreased mobility, difficulty walking, decreased ROM, decreased strength, impaired flexibility, improper body mechanics, and postural dysfunction.    ACTIVITY LIMITATIONS cleaning, community activity, and driving.    PERSONAL FACTORS Age, Past/current experiences, Time since onset of injury/illness/exacerbation, and 1-2 comorbidities: TN, HLD, DMII, CAD, carotid artery disease s/p R CEA and history of multiple strokes  are also affecting patient's functional outcome.      REHAB POTENTIAL: Good   CLINICAL DECISION MAKING: Stable/uncomplicated   EVALUATION COMPLEXITY: Low   PLAN: PT FREQUENCY: 2x/week   PT DURATION: 8 weeks   PLANNED INTERVENTIONS: Therapeutic exercises, Therapeutic activity, Neuromuscular re-education, Balance training, Gait training, Patient/Family education, Joint mobilization, Stair training, Orthotic/Fit training, Cryotherapy, Moist heat, and Manual therapy   PLAN FOR NEXT SESSION: Wife and pt asked about doing a recert.... unsure if he is going to benefit. Told them that they need to start performing HEP/walking program consistently for actual improvement. Work on closed [employer] activation ( R weaker compared to Left) .  JCruzita LedererPlaster, PT,  DPT 06/13/2022, 3:40 PM

## 2022-06-14 ENCOUNTER — Ambulatory Visit (INDEPENDENT_AMBULATORY_CARE_PROVIDER_SITE_OTHER): Payer: Medicare PPO

## 2022-06-14 VITALS — Wt 215.0 lb

## 2022-06-14 DIAGNOSIS — Z0000 Encounter for general adult medical examination without abnormal findings: Secondary | ICD-10-CM

## 2022-06-14 NOTE — Patient Instructions (Signed)
[name] , Thank you for taking time to come for your Medicare Wellness Visit. I appreciate your ongoing commitment to your health goals. Please review the following plan we discussed and let me know if I can assist you in the future.   Screening recommendations/referrals: Colonoscopy: aged out Recommended yearly ophthalmology/optometry visit for glaucoma screening and checkup Recommended yearly dental visit for hygiene and checkup  Vaccinations: Influenza vaccine: 03/10/21 Pneumococcal vaccine: 08/13/17 Tdap vaccine: 05/16/10, due if have an injury Shingles vaccine: n/d   Covid-19: 02/14/20, 03/08/20, 11/18/20  Advanced directives: no  Conditions/risks identified: none  Next appointment: Follow up in one year for your annual wellness visit. 06/18/23 @ 10:30 am by phone  Preventive Care 65 Years and Older, Male Preventive care refers to lifestyle choices and visits with your health care provider that can promote health and wellness. What does preventive care include? A yearly physical exam. This is also called an annual well check. Dental exams once or twice a year. Routine eye exams. Ask your health care provider how often you should have your eyes checked. Personal lifestyle choices, including: Daily care of your teeth and gums. Regular physical activity. Eating a healthy diet. Avoiding tobacco and drug use. Limiting alcohol use. Practicing safe sex. Taking low doses of aspirin every day. Taking vitamin and mineral supplements as recommended by your health care provider. What happens during an annual well check? The services and screenings done by your health care provider during your annual well check will depend on your age, overall health, lifestyle risk factors, and family history of disease. Counseling  Your health care provider may ask you questions about your: Alcohol use. Tobacco use. Drug use. Emotional well-being. Home and relationship well-being. Sexual  activity. Eating habits. History of falls. Memory and ability to understand (cognition). Work and work Statistician. Screening  You may have the following tests or measurements: Height, weight, and BMI. Blood pressure. Lipid and cholesterol levels. These may be checked every 5 years, or more frequently if you are over 67 years old. Skin check. Lung cancer screening. You may have this screening every year starting at age 61 if you have a 30-pack-year history of smoking and currently smoke or have quit within the past 15 years. Fecal occult blood test (FOBT) of the stool. You may have this test every year starting at age 37. Flexible sigmoidoscopy or colonoscopy. You may have a sigmoidoscopy every 5 years or a colonoscopy every 10 years starting at age 48. Prostate cancer screening. Recommendations will vary depending on your family history and other risks. Hepatitis C blood test. Hepatitis B blood test. Sexually transmitted disease (STD) testing. Diabetes screening. This is done by checking your blood sugar (glucose) after you have not eaten for a while (fasting). You may have this done every 1-3 years. Abdominal aortic aneurysm (AAA) screening. You may need this if you are a current or former smoker. Osteoporosis. You may be screened starting at age 15 if you are at high risk. Talk with your health care provider about your test results, treatment options, and if necessary, the need for more tests. Vaccines  Your health care provider may recommend certain vaccines, such as: Influenza vaccine. This is recommended every year. Tetanus, diphtheria, and acellular pertussis (Tdap, Td) vaccine. You may need a Td booster every 10 years. Zoster vaccine. You may need this after age 31. Pneumococcal 13-valent conjugate (PCV13) vaccine. One dose is recommended after age 30. Pneumococcal polysaccharide (PPSV23) vaccine. One dose is recommended after age  79. Talk to your health care provider about which  screenings and vaccines you need and how often you need them. This information is not intended to replace advice given to you by your health care provider. Make sure you discuss any questions you have with your health care provider. Document Released: 01/13/2016 Document Revised: 09/05/2016 Document Reviewed: 10/18/2015 Elsevier Interactive Patient Education  2017 [place] Prevention in the Home Falls can cause injuries. They can happen to people of all ages. There are many things you can do to make your home safe and to help prevent falls. What can I do on the outside of my home? Regularly fix the edges of walkways and driveways and fix any cracks. Remove anything that might make you trip as you walk through a door, such as a raised step or threshold. Trim any bushes or trees on the path to your home. Use bright outdoor lighting. Clear any walking paths of anything that might make someone trip, such as rocks or tools. Regularly check to see if handrails are loose or broken. Make sure that both sides of any steps have handrails. Any raised decks and porches should have guardrails on the edges. Have any leaves, snow, or ice cleared regularly. Use sand or salt on walking paths during winter. Clean up any spills in your garage right away. This includes oil or grease spills. What can I do in the bathroom? Use night lights. Install grab bars by the toilet and in the tub and shower. Do not use towel bars as grab bars. Use non-skid mats or decals in the tub or shower. If you need to sit down in the shower, use a plastic, non-slip stool. Keep the floor dry. Clean up any water that spills on the floor as soon as it happens. Remove soap buildup in the tub or shower regularly. Attach bath mats securely with double-sided non-slip rug tape. Do not have throw rugs and other things on the floor that can make you trip. What can I do in the bedroom? Use night lights. Make sure that you have a  light by your bed that is easy to reach. Do not use any sheets or blankets that are too big for your bed. They should not hang down onto the floor. Have a firm chair that has side arms. You can use this for support while you get dressed. Do not have throw rugs and other things on the floor that can make you trip. What can I do in the kitchen? Clean up any spills right away. Avoid walking on wet floors. Keep items that you use a lot in easy-to-reach places. If you need to reach something above you, use a strong step stool that has a grab bar. Keep electrical cords out of the way. Do not use floor polish or wax that makes floors slippery. If you must use wax, use non-skid floor wax. Do not have throw rugs and other things on the floor that can make you trip. What can I do with my stairs? Do not leave any items on the stairs. Make sure that there are handrails on both sides of the stairs and use them. Fix handrails that are broken or loose. Make sure that handrails are as long as the stairways. Check any carpeting to make sure that it is firmly attached to the stairs. Fix any carpet that is loose or worn. Avoid having throw rugs at the top or bottom of the stairs. If you do have  throw rugs, attach them to the floor with carpet tape. Make sure that you have a light switch at the top of the stairs and the bottom of the stairs. If you do not have them, ask someone to add them for you. What else can I do to help prevent falls? Wear shoes that: Do not have high heels. Have rubber bottoms. Are comfortable and fit you well. Are closed at the toe. Do not wear sandals. If you use a stepladder: Make sure that it is fully opened. Do not climb a closed stepladder. Make sure that both sides of the stepladder are locked into place. Ask someone to hold it for you, if possible. Clearly mark and make sure that you can see: Any grab bars or handrails. First and last steps. Where the edge of each step  is. Use tools that help you move around (mobility aids) if they are needed. These include: Canes. Walkers. Scooters. Crutches. Turn on the lights when you go into a dark area. Replace any light bulbs as soon as they burn out. Set up your furniture so you have a clear path. Avoid moving your furniture around. If any of your floors are uneven, fix them. If there are any pets around you, be aware of where they are. Review your medicines with your doctor. Some medicines can make you feel dizzy. This can increase your chance of falling. Ask your doctor what other things that you can do to help prevent falls. This information is not intended to replace advice given to you by your health care provider. Make sure you discuss any questions you have with your health care provider. Document Released: 10/13/2009 Document Revised: 05/24/2016 Document Reviewed: 01/21/2015 Elsevier Interactive Patient Education  2017 [employer].

## 2022-06-14 NOTE — Progress Notes (Signed)
Virtual Visit via Telephone Note  I connected with  [name] [name] on 06/14/22 at  8:45 AM EDT by telephone and verified that I am speaking with the correct person using two identifiers.  Location: Patient: home Provider: [place] Persons participating in the virtual visit: [place]   I discussed the limitations, risks, security and privacy concerns of performing an evaluation and management service by telephone and the availability of in person appointments. The patient expressed understanding and agreed to proceed.  Interactive audio and video telecommunications were attempted between this nurse and patient, however failed, due to patient having technical difficulties OR patient did not have access to video capability.  We continued and completed visit with audio only.  Some vital signs may be absent or patient reported.   [name] David, LPN  Subjective:   [name] [name] is a 81 y.o. male who presents for Medicare Annual/Subsequent preventive examination.  Review of Systems           Objective:    There were no vitals filed for this visit. There is no height or weight on file to calculate BMI.     04/20/2022    2:37 PM 03/01/2022    3:25 PM 02/06/2022   10:24 AM 10/18/2021    9:30 AM 08/08/2021   10:58 AM 06/13/2021    9:03 AM 05/16/2021    2:25 PM  Advanced Directives  Does Patient Have a Medical Advance Directive? No No No No Yes No No  Type of Advance Directive     Living will    Would patient like information on creating a medical advance directive?  No - Patient declined    No - Patient declined     Current Medications (verified) Outpatient Encounter Medications as of 06/14/2022  Medication Sig   atorvastatin (LIPITOR) 20 MG tablet Take 0.5 tablets (10 mg total) by mouth daily. For cholesterol.   cholecalciferol (VITAMIN D3) 25 MCG (1000 UNIT) tablet Take 1,000 Units by mouth daily.   clopidogrel (PLAVIX) 75 MG tablet Take 1 tablet (75  mg total) by mouth daily.   cyanocobalamin 1000 MCG tablet Take 1 tablet by mouth daily.   glucose blood (ACCU-CHEK GUIDE) test strip USE AS DIRECTED TO TEST BLOOD SUGAR UP TO 4 TIMES DAILY   losartan (COZAAR) 25 MG tablet TAKE 2 TABLETS (50 MG TOTAL) BY MOUTH DAILY. FOR BLOOD PRESSURE.   nitroGLYCERIN (NITROSTAT) 0.4 MG SL tablet Place 1 tablet (0.4 mg total) under the tongue every 5 (five) minutes as needed for chest pain.   vitamin B-12 (CYANOCOBALAMIN) 1000 MCG tablet Take 1,000 mcg by mouth daily.   hydrochlorothiazide (HYDRODIURIL) 12.5 MG tablet Take 12.5 mg by mouth daily at 2 PM. On hold for now. (Patient not taking: Reported on 06/14/2022)   No facility-administered encounter medications on file as of 06/14/2022.    Allergies (verified) Niacin and Ativan [lorazepam]   History: Past Medical History:  Diagnosis Date   Basal cell carcinoma 11/09/2019   nod & infil-behind right ear-cx3 &exc   Basal cell carcinoma 03/21/2020   Residual BCC with peripheral margin involved - ST recommends [facility]   Carotid artery occlusion    Coronary artery disease    s/p CABG 2005; sees Dr Johnsie Cancel yearly   Diabetes mellitus without complication ([place])    Gynecomastia    Hypercholesterolemia    Hypertension    Neurodermatitis    Overweight(278.02)    Personal history of colonic polyps 10/08/2006  tubular adenomas   Renal insufficiency    Stroke [facility])    TIA  April 2022   Transient ischemic attack 2010   "lasted ~ 5 seconds"   Vitamin D deficiency    Past Surgical History:  Procedure Laterality Date   CARDIAC CATHETERIZATION  02/2004   "tried to stent; couldn't"   CAROTID ENDARTERECTOMY Right 08/26/2015   COLONOSCOPY     CORONARY ANGIOPLASTY     CORONARY ARTERY BYPASS GRAFT  Feb. 2005   4 vessel   ENDARTERECTOMY Right 08/26/2015   Procedure: Right Carotid ENDARTERECTOMY with Patch Angioplasty ;  Surgeon: Rosetta Posner, MD;  Location: [facility] OR;  Service: Vascular;  Laterality: Right;    ENDARTERECTOMY Left 05/18/2021   Procedure: LEFT CAROTID ARTERY ENDARTERECTOMY  with patch angioplasty;  Surgeon: Rosetta Posner, MD;  Location: [facility] OR;  Service: Vascular;  Laterality: Left;   EYE SURGERY     bilateral cataract   KELOID EXCISION  04/2008   on chest scar; Dr. Dessie Coma   KELOID EXCISION     PILONIDAL CYST EXCISION  1989   Family History  Problem Relation Age of Onset   Heart disease Mother        Before age 74   Diabetes Mother    Kidney disease Mother    Heart attack Mother 25   Lung cancer Father 54   Diabetes Brother    Heart disease Brother    Heart disease Brother    Arthritis Brother    Diabetes Sister    Fibromyalgia Sister    Lung cancer Paternal Uncle        questionable as to if it was lung ca   Healthy Daughter    Colon cancer Neg Hx    Stroke Neg Hx    Social History   Socioeconomic History   Marital status: Married    Spouse name: [name] [name]   Number of children: 1   Years of education: Not on file   Highest education level: Some college, no degree  Occupational History   Occupation: retired  Tobacco Use   Smoking status: Never   Smokeless tobacco: Former  Scientific laboratory technician Use: Never used  Substance and Sexual Activity   Alcohol use: No    Alcohol/week: 0.0 standard drinks of alcohol   Drug use: No   Sexual activity: Yes  Other Topics Concern   Not on file  Social History Narrative   Retired.   Once worked for the [employer].   Married.   Enjoys reading, spending time with family.    Left handed   Drinks caffeine   One story home   Social Determinants of Health   Financial Resource Strain: Low Risk  (04/10/2021)   Overall Financial Resource Strain (CARDIA)    Difficulty of Paying Living Expenses: Not hard at all  Food Insecurity: No Food Insecurity (04/10/2021)   Hunger Vital Sign    Worried About Running Out of Food in the Last Year: Never true    Ran Out of Food in the Last Year: Never true  Transportation Needs: No Transportation  Needs (04/10/2021)   PRAPARE - Hydrologist (Medical): No    Lack of Transportation (Non-Medical): No  Physical Activity: Insufficiently Active (06/14/2022)   Exercise Vital Sign    Days of Exercise per Week: 3 days    Minutes of Exercise per Session: 30 min  Stress: No Stress Concern Present (06/14/2022)   [employer] of  Occupational Health - Occupational Stress Questionnaire    Feeling of Stress : Not at all  Social Connections: Moderately Integrated (04/10/2021)   Social Connection and Isolation Panel [NHANES]    Frequency of Communication with Friends and Family: More than three times a week    Frequency of Social Gatherings with Friends and Family: More than three times a week    Attends Religious Services: More than 4 times per year    Active Member of [employer] or Organizations: No    Attends Music therapist: Never    Marital Status: Married    Tobacco Counseling Counseling given: Not Answered   Clinical Intake:  Pre-visit preparation completed: Yes  Pain : No/denies pain     Nutritional Risks: None Diabetes: Yes CBG done?: No Did pt. bring in CBG monitor from home?: No  How often do you need to have someone help you when you read instructions, pamphlets, or other written materials from your doctor or pharmacy?: 1 - Never  Diabetic?yes Nutrition Risk Assessment:  Has the patient had any N/V/D within the last 2 months?  No  Does the patient have any non-healing wounds?  No  Has the patient had any unintentional weight loss or weight gain?  No   Diabetes:  Is the patient diabetic?  Yes  If diabetic, was a CBG obtained today?  No  Did the patient bring in their glucometer from home?  No  How often do you monitor your CBG's? Twice/week   Financial Strains and Diabetes Management:  Are you having any financial strains with the device, your supplies or your medication? No .  Does the patient want to be seen by Chronic  Care Management for management of their diabetes?  No  Would the patient like to be referred to a Nutritionist or for Diabetic Management?  No   Diabetic Exams:  Diabetic Eye Exam: Completed 11/02/21.  Pt has been advised about the importance in completing this exam.   Diabetic Foot Exam: Completed 02/13/22. Pt has been advised about the importance in completing this exam.     Interpreter Needed?: No  Information entered by :: Kirke Shaggy, LPN   Activities of Daily Living    03/01/2022    4:00 PM 03/01/2022    3:25 PM  In your present state of health, do you have any difficulty performing the following activities:  Hearing?  1  Vision?  1  Difficulty concentrating or making decisions?  0  Walking or climbing stairs?  0  Dressing or bathing?  1  Doing errands, shopping? 1 0    Patient Care Team: Pleas Koch, NP as PCP - General (Internal Medicine) Josue Hector, MD as PCP - Cardiology (Cardiology) Josue Hector, MD as Attending Physician (Cardiology) Pieter Partridge, DO as Consulting Physician (Neurology)  Indicate any recent Medical Services you may have received from other than [facility] providers in the past year (date may be approximate).     Assessment:   This is a routine wellness examination for [place].  Hearing/Vision screen No results found.  Dietary issues and exercise activities discussed:     Goals Addressed             This Visit's Progress    DIET - EAT MORE FRUITS AND VEGETABLES         Depression Screen    06/14/2022    8:58 AM 04/20/2022    4:07 PM 03/22/2022    3:42 PM 08/08/2021  11:00 AM 06/13/2021    9:06 AM 04/24/2021    1:57 PM 03/10/2021    7:20 AM  PHQ 2/9 Scores  PHQ - 2 Score 0 0 2 2 0 0 0  PHQ- 9 Score 0 0 6  0  2    Fall Risk    04/20/2022    2:36 PM 10/18/2021    9:30 AM 08/08/2021   11:00 AM 06/13/2021    9:05 AM 04/24/2021    1:57 PM  [place] in the past year? 0 0 0 0 0  Number falls in past yr: 0 0  0    Injury with Fall? 0 0  0   Risk for fall due to :    Impaired balance/gait;Medication side effect   Follow up    Falls evaluation completed;Falls prevention discussed     FALL RISK PREVENTION PERTAINING TO THE HOME:  Any stairs in or around the home? No  If so, are there any without handrails? No  Home free of loose throw rugs in walkways, pet beds, electrical cords, etc? Yes  Adequate lighting in your home to reduce risk of falls? Yes   ASSISTIVE DEVICES UTILIZED TO PREVENT FALLS:  Life alert? No  Use of a cane, walker or w/c? Yes  Grab bars in the bathroom? Yes  Shower chair or bench in shower? Yes  Elevated toilet seat or a handicapped toilet? Yes    Cognitive Function:declined to test      06/13/2021    9:12 AM  MMSE - Mini Mental State Exam  Orientation to time 5  Orientation to Place 5  Registration 3  Attention/ Calculation 5  Recall 3  Language- repeat 1        Immunizations Immunization History  Administered Date(s) Administered   Fluad Quad(high Dose 65+) 03/10/2021   H1N1 02/03/2009   Influenza Split 10/30/2011, 10/02/2012, 11/30/2013, 11/30/2017   Influenza Whole 10/18/2009   Influenza, High Dose Seasonal PF 11/30/2016, 11/12/2018, 10/03/2019   Influenza,inj,Quad PF,6+ Mos 01/28/2015, 01/30/2016   PFIZER(Purple Top)SARS-COV-2 Vaccination 02/14/2020, 03/08/2020, 11/18/2020   Pneumococcal Conjugate-13 08/13/2017   Pneumococcal Polysaccharide-23 10/01/1999, 11/17/2009   Td 10/01/1999, 05/16/2010    TDAP status: Due, Education has been provided regarding the importance of this vaccine. Advised may receive this vaccine at local pharmacy or Health Dept. Aware to provide a copy of the vaccination record if obtained from local pharmacy or Health Dept. Verbalized acceptance and understanding.  Flu Vaccine status: Up to date  Pneumococcal vaccine status: Up to date  Covid-19 vaccine status: Completed vaccines  Qualifies for Shingles Vaccine? Yes    Zostavax completed No   Shingrix Completed?: No.    Education has been provided regarding the importance of this vaccine. Patient has been advised to call insurance company to determine out of pocket expense if they have not yet received this vaccine. Advised may also receive vaccine at local pharmacy or Health Dept. Verbalized acceptance and understanding.  Screening Tests Health Maintenance  Topic Date Due   Hepatitis C Screening  Never done   TETANUS/TDAP  05/16/2020   COVID-19 Vaccine (4 - Booster for Pfizer series) 05/22/2023 (Originally 01/13/2021)   INFLUENZA VACCINE  07/31/2022   HEMOGLOBIN A1C  09/01/2022   OPHTHALMOLOGY EXAM  11/02/2022   FOOT EXAM  02/13/2023   Pneumonia Vaccine 22+ Years old  Completed   HPV VACCINES  Aged Out   COLONOSCOPY (Pts 45-61yr Insurance coverage will need to be confirmed)  Discontinued  Zoster Vaccines- Shingrix  Discontinued    Health Maintenance  Health Maintenance Due  Topic Date Due   Hepatitis C Screening  Never done   TETANUS/TDAP  05/16/2020    Colorectal cancer screening: No longer required.   Lung Cancer Screening: (Low Dose CT Chest recommended if Age 83-80 years, 30 pack-year currently smoking OR have quit w/in 15years.) does not qualify.    Additional Screening:  Hepatitis C Screening: does qualify; Completed no  Vision Screening: Recommended annual ophthalmology exams for early detection of glaucoma and other disorders of the eye. Is the patient up to date with their annual eye exam?  Yes  Who is the provider or what is the name of the office in which the patient attends annual eye exams? Dr.Lyles If pt is not established with a provider, would they like to be referred to a provider to establish care? No .   Dental Screening: Recommended annual dental exams for proper oral hygiene  Community Resource Referral / Chronic Care Management: CRR required this visit?  No   CCM required this visit?  No      Plan:      I have personally reviewed and noted the following in the patient's chart:   Medical and social history Use of alcohol, tobacco or illicit drugs  Current medications and supplements including opioid prescriptions. Patient is not currently taking opioid prescriptions. Functional ability and status Nutritional status Physical activity Advanced directives List of other physicians Hospitalizations, surgeries, and ER visits in previous 12 months Vitals Screenings to include cognitive, depression, and falls Referrals and appointments  In addition, I have reviewed and discussed with patient certain preventive protocols, quality metrics, and best practice recommendations. A written personalized care plan for preventive services as well as general preventive health recommendations were provided to patient.     [name] David, LPN   0/10/711   Nurse Notes: none

## 2022-06-15 ENCOUNTER — Ambulatory Visit: Payer: Medicare PPO

## 2022-06-15 ENCOUNTER — Ambulatory Visit: Payer: Medicare PPO | Admitting: Speech Pathology

## 2022-06-15 DIAGNOSIS — R2681 Unsteadiness on feet: Secondary | ICD-10-CM | POA: Diagnosis not present

## 2022-06-15 DIAGNOSIS — R1312 Dysphagia, oropharyngeal phase: Secondary | ICD-10-CM | POA: Diagnosis not present

## 2022-06-15 DIAGNOSIS — M6281 Muscle weakness (generalized): Secondary | ICD-10-CM

## 2022-06-15 DIAGNOSIS — R1310 Dysphagia, unspecified: Secondary | ICD-10-CM | POA: Diagnosis not present

## 2022-06-15 DIAGNOSIS — R2689 Other abnormalities of gait and mobility: Secondary | ICD-10-CM | POA: Diagnosis not present

## 2022-06-15 NOTE — Therapy (Signed)
OUTPATIENT PHYSICAL THERAPY TREATMENT NOTE   Patient Name: [name] [name] MRN: [id number] DOB:May 04, 1941, 81 y.o., male Today's Date: 06/15/2022  PCP: Pleas Koch, NP, Josue Hector, MD REFERRING PROVIDER: Pleas Koch, NP    END OF SESSION:   PT End of Session - 06/15/22 1320     Visit Number 12    Number of Visits 16    Date for PT Re-Evaluation 06/27/22    Authorization Type [employer] Medicare - will need auth    Progress Note Due on Visit 10    PT Start Time 1315    PT Stop Time 1400    PT Time Calculation (min) 45 min    Activity Tolerance Patient tolerated treatment well    Behavior During Therapy [facility] for tasks assessed/performed;Flat affect               Past Medical History:  Diagnosis Date   Basal cell carcinoma 11/09/2019   nod & infil-behind right ear-cx3 &exc   Basal cell carcinoma 03/21/2020   Residual BCC with peripheral margin involved - ST recommends [facility]   Carotid artery occlusion    Coronary artery disease    s/p CABG 2005; sees Dr Johnsie Cancel yearly   Diabetes mellitus without complication ([place])    Gynecomastia    Hypercholesterolemia    Hypertension    Neurodermatitis    Overweight(278.02)    Personal history of colonic polyps 10/08/2006   tubular adenomas   Renal insufficiency    Stroke [facility])    TIA  April 2022   Transient ischemic attack 2010   "lasted ~ 5 seconds"   Vitamin D deficiency    Past Surgical History:  Procedure Laterality Date   CARDIAC CATHETERIZATION  02/2004   "tried to stent; couldn't"   CAROTID ENDARTERECTOMY Right 08/26/2015   COLONOSCOPY     CORONARY ANGIOPLASTY     CORONARY ARTERY BYPASS GRAFT  Feb. 2005   4 vessel   ENDARTERECTOMY Right 08/26/2015   Procedure: Right Carotid ENDARTERECTOMY with Patch Angioplasty ;  Surgeon: Rosetta Posner, MD;  Location: [place];  Service: Vascular;  Laterality: Right;   ENDARTERECTOMY Left 05/18/2021   Procedure: LEFT CAROTID ARTERY ENDARTERECTOMY  with patch angioplasty;   Surgeon: Rosetta Posner, MD;  Location: [place];  Service: Vascular;  Laterality: Left;   EYE SURGERY     bilateral cataract   KELOID EXCISION  04/2008   on chest scar; Dr. Dessie Coma   KELOID EXCISION     PILONIDAL CYST EXCISION  1989   Patient Active Problem List   Diagnosis Date Noted   Depression 03/22/2022   Stroke-like symptoms 03/01/2022   History of CVA (cerebrovascular accident) 02/28/2022   Aspiration pneumonia vs. 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   Carotid artery disease ([place]) 07/01/2012   Special screening for malignant neoplasm of prostate 05/08/2011   Vitamin D deficiency 11/20/2009   Transient cerebral ischemia 06/30/2009   CEREBROVASCULAR DISEASE 06/30/2009   COLONIC POLYPS 01/10/2008  Essential hypertension 01/10/2008   Coronary atherosclerosis 01/10/2008   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   GYNECOMASTIA, UNILATERAL 01/10/2008   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   SHOULDER PAIN 01/10/2008   Type 2 diabetes mellitus ([place]) 01/10/2008    REFERRING DIAG:  F57.32 (ICD-10-CM) - History of CVA (cerebrovascular accident) I63.81 (ICD-10-CM) - 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 They went to [facility] center with daughter and walked around for 10 min .  PAIN:  Are you having pain? No  There were no vitals filed for this visit.    OBJECTIVE: (objective measures completed at initial evaluation unless otherwise dated)    TODAY'S TREATMENT:  Neuro Re-ed: R sidelying L manually resisted hip flexion and knee flexion and manually resisted L hip extension: 3 x 10 (For last set, we worked on doing it quicker. Sit to stand: worked on quick stands ups: 3 x 5 bil hands on chair in front Mini jumps at countertop.  x 10 Picking up 15lb kettle ball from floor: 2 x 5x TUG performed: 5 times without AD, focus on moving increasing speed slightly. After each rep, pt was asked to analyze how his performance was and what was he going to do fix it? Trial 1: 19.40 sec Trial 2: 18.09 sec Tiral 3: 18.78 sec Trial 4: 16.47 sec, cues for improve posture. Trial 5: 17.07 sec, cues for improved posture   PATIENT EDUCATION: Education details: See above  Person educated: Patient and Spouse Education method: Explanation  Education comprehension: verbalized understanding and needs further education      HOME EXERCISE PROGRAM: Access Code: [email or web address] URL: [email or web address] Date: 05/11/2022 Prepared by: Mickie Bail Plaster  Exercises - Sit to Stand Without Arm Support  - 1 x daily - 7 x weekly - 3 sets - 5-7 reps - Heel Raise on Step  - 1 x daily - 7 x weekly - 3 sets - 10 reps - Forward Step Down with Heel Tap and Rail Support  - 1 x daily - 7 x weekly - 3 sets - 10 reps       GOALS: Goals reviewed with patient? Yes   SHORT TERM GOALS: Target date: 05/30/2022   Patient will demo 0.44ms walking speed with walker to improve gait speed and community ambulation Baseline: 0.50 m/s with walker; 0.561m with walker(06/01/22)  Goal status: not met   2.  Patient will be able to perform sit to stand without HHA to improve  functional strength Baseline: Requires 1-2 HHA from bed or chair to stand up (eval); able to do 2 reps without HHA but requires multiple attempt to stand up Goal status: Goal met.   3.  Patient will report 50% compliance with HEP to improve self management of symptoms Baseline: HEP to be issued 2nd visit Goal status: goal met.     LONG TERM GOALS: Target date: 06/27/2022   Patient will demo gait speed of >0.70m1mwith LRAD to improve community ambulation and decrease fall risk Baseline: 0.5 m/s with walker; 0.55 m/s (06/01/22) Goal status: progressing   2.  Pt will demo BBS of >50/56 to reduce fall risk Baseline: 42/56 (05/02/22); 50/56 (06/08/22) Goal status: goal met  3.  Patient will demo TUG <15 seconds with LRAD to improve functional mobility Baseline: 21 sec with RW; 26 sec with RW (06/01/22); 23 sec with st. Cane (06/02/22) Goal status: Progressing    4. Patient will report 10 points of improvement on FOTO to improve overall function Baseline: 54 (06/08/22) Goal status: progressing   ASSESSMENT:   CLINICAL IMPRESSION: Today's emphasis was on improving hip flexion/knee flexion on L LE to improve mm coordination during L swing phase. We worked on mm power development with improved speed during functional movements. Postural cues significantly improved patient's mobility with time with fewer stumbles during functional mobility.     OBJECTIVE IMPAIRMENTS Abnormal gait, decreased activity tolerance, decreased balance, decreased endurance, decreased mobility, difficulty walking, decreased ROM, decreased strength, impaired flexibility, improper body mechanics, and postural dysfunction.    ACTIVITY LIMITATIONS cleaning, community activity, and driving.    PERSONAL FACTORS Age, Past/current experiences, Time since onset of injury/illness/exacerbation, and 1-2 comorbidities: [place], HLD, DMII, CAD, carotid artery disease s/p R CEA and history of multiple strokes  are also affecting patient's  functional outcome.      REHAB POTENTIAL: Good   CLINICAL DECISION MAKING: Stable/uncomplicated   EVALUATION COMPLEXITY: Low   PLAN: PT FREQUENCY: 2x/week   PT DURATION: 8 weeks   PLANNED INTERVENTIONS: Therapeutic exercises, Therapeutic activity, Neuromuscular re-education, Balance training, Gait training, Patient/Family education, Joint mobilization, Stair training, Orthotic/Fit training, Cryotherapy, Moist heat, and Manual therapy   PLAN FOR NEXT SESSION: Reertify next sessions, Continue to work on BBS components, gait training, balance.  [name], PT, DPT 06/15/2022, 1:55 PM

## 2022-06-15 NOTE — Therapy (Signed)
OUTPATIENT SPEECH LANGUAGE PATHOLOGY DYSPHAGIA TREATMENT NOTE   Patient Name: [name] [name] MRN: [id number] DOB:07-15-1941, 81 y.o., male Today's Date: 06/15/2022  PCP: Pleas Koch, NP  REFERRING PROVIDER: Pleas Koch, NP   END OF SESSION:   End of Session - 06/15/22 1231     Visit Number 9    Number of Visits 17    Date for SLP Re-Evaluation 07/12/22    Authorization Type [employer]    Progress Note Due on Visit 10    SLP Start Time 1231    SLP Stop Time  1315    SLP Time Calculation (min) 44 min    Activity Tolerance Patient tolerated treatment well                    Past Medical History:  Diagnosis Date   Basal cell carcinoma 11/09/2019   nod & infil-behind right ear-cx3 &exc   Basal cell carcinoma 03/21/2020   Residual BCC with peripheral margin involved - ST recommends [facility]   Carotid artery occlusion    Coronary artery disease    s/p CABG 2005; sees Dr Johnsie Cancel yearly   Diabetes mellitus without complication ([place])    Gynecomastia    Hypercholesterolemia    Hypertension    Neurodermatitis    Overweight(278.02)    Personal history of colonic polyps 10/08/2006   tubular adenomas   Renal insufficiency    Stroke [facility])    TIA  April 2022   Transient ischemic attack 2010   "lasted ~ 5 seconds"   Vitamin D deficiency    Past Surgical History:  Procedure Laterality Date   CARDIAC CATHETERIZATION  02/2004   "tried to stent; couldn't"   CAROTID ENDARTERECTOMY Right 08/26/2015   COLONOSCOPY     CORONARY ANGIOPLASTY     CORONARY ARTERY BYPASS GRAFT  Feb. 2005   4 vessel   ENDARTERECTOMY Right 08/26/2015   Procedure: Right Carotid ENDARTERECTOMY with Patch Angioplasty ;  Surgeon: Rosetta Posner, MD;  Location: [place];  Service: Vascular;  Laterality: Right;   ENDARTERECTOMY Left 05/18/2021   Procedure: LEFT CAROTID ARTERY ENDARTERECTOMY  with patch angioplasty;  Surgeon: Rosetta Posner, MD;  Location: [place];  Service: Vascular;  Laterality:  Left;   EYE SURGERY     bilateral cataract   KELOID EXCISION  04/2008   on chest scar; Dr. Dessie Coma   KELOID EXCISION     PILONIDAL CYST EXCISION  1989   Patient Active Problem List   Diagnosis Date Noted   Depression 03/22/2022   Stroke-like symptoms 03/01/2022   History of CVA (cerebrovascular accident) 02/28/2022   Aspiration pneumonia vs. 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   Carotid artery disease ([place]) 07/01/2012   Special screening for malignant neoplasm of prostate 05/08/2011   Vitamin D deficiency 11/20/2009   Transient cerebral ischemia 06/30/2009   CEREBROVASCULAR DISEASE 06/30/2009   COLONIC POLYPS 01/10/2008   Essential hypertension 01/10/2008   Coronary atherosclerosis 01/10/2008   Venous (  peripheral) insufficiency 01/10/2008   GYNECOMASTIA, UNILATERAL 01/10/2008   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  No  OBJECTIVE:   RECOMMENDATIONS FROM OBJECTIVE SWALLOW STUDY (MBSS/FEES):  regular diet Objective swallow impairments: reduced posterior bolus propulsion, reduced bolus cohesion, and a pharyngeal delay. Penetration (PAS 3, 5) was noted with thin and nectar thick liquids via cup and straw and aspiration (PAS 7, 8) was observed with consecutive swallows of thin liquids, with thin liquids via cup when the pill was taken and with consecutive swallows of nectar thick liquids. Instances of aspiration inconsistently triggered delayed coughing and this was noted most frequently with larger amounts of aspiration. Coughing was ineffective in expelling the aspirate, but cued coughing did expel penetrated material. With thin and nectar thick liquids, penetration and aspiration were eliminated with a chin tuck posture with left head turn and use of individual swallows. Laryngeal invasion was not observed with individual swallows of thin liquids when pt was cued to swallow on the count of three. Laryngeal invasion was also improved to a functional level of (PAS 2) with smaller boluses of thin liquids. No instances of penetration/aspiration were noted with honey thick liquids. Objective recommended compensations: cup, no straw; meds with puree, small bite/sips, slow rate  TODAY'S TREATMENT:   DYSPHAGIA TREATMENT:   Current diet: regular and honey thick liquids Swallow precautions: small bites, small sips, alternate bites and sips, chin tuck, head turn, stay upright 30 minutes post meal, hard swallow, no straws, and avoid mixed consistencies Patient directly observed with POs: Yes: thin liquids and honey thick liquids   Feeding: able to feed self Liquids provided by: cup and straw  Oral phase signs and symptoms:  n/a Pharyngeal phase signs and symptoms: immediate cough and delayed cough  Therapeutic exercises: Effortful Swallow Types of cueing: verbal Amount of cueing: moderate Treatment comments: Pt able to  teach back swallow exercises and swallow compensations with rare min-A. Demonstrate chin tuck and head turn with thin liquid bolus x2. Overall improved performance with thin liquid intake this date with x3 instance of throat clear or cough over 15+ small sips from cup and straw.   Reports intermittent compliance with diet recommendations. Having thin liquids with intermittent success in using strategies. Wife does report improvement in using chin tuck and head turn with less incidences of coughing/throat clearing when having thin liquids.  Re-education provided on risks associated with aspiration and deficits evidenced by MBSS. SLP reiterates recommendations of only having thin liquids when using chin tuck and head turn, limited thin liquids to defined times when cueing can be done, limiting distractions with thin liquid intake, and always taking small bites/sips.   HOME EXERCISE PROGRAM: Exercises: Effortful Swallow 20-30 repetitions, 3 x/day, Masako 10 repetitions, 3 x/day, Shaker 10 repetitions, 3 x/day, and EMST 10 repetitions, 2 x/day  PATIENT EDUCATION: Education details: see above Person educated: Patient and Spouse Education method: Explanation, Demonstration, Verbal cues, and Handouts Education comprehension: verbalized understanding, returned demonstration, and needs further education     ASSESSMENT:  CLINICAL IMPRESSION: Patient is a 81 y.o. M who was seen today for dysphagia and dysarthria. Instrumental swallow study demonstrates penetration and aspiration with thin and nectar thick liquids. Impairments in swallow include reduced posterior bolus propulsion, reduced bolus cohesion, and a pharyngeal delay. Chin tuck and head turn to left reduce laryngeal penetration, however pt with difficulty recalling to follow this protocol.  Wife, [name], is thickening all liquids for pt. Ongoing education and training of dysphagia exercises completed, with inconsistent compliance and subjective  reported improvement in swallow function at home.    OBJECTIVE IMPAIRMENTS include dysarthria and dysphagia. These impairments are limiting patient from safety when swallowing and effectively communicating at home and in community. Factors affecting potential to achieve goals and functional outcome are ability to learn/carryover information and previous level of function. Patient will benefit from skilled SLP services to address above impairments and improve overall function.   REHAB POTENTIAL: Fair       GOALS: Goals reviewed with patient? Yes   SHORT TERM GOALS: Target date: 06/14/2022     Pt and wife will report compliance with HEP frequency and repetitions with mod-I over 2 sessions. Baseline: 06-08-22, 06-13-22 Goal status: partially met   2.  Pt and wife will report implementation of visual aids at home to support pt recall of targeted swallowing compensations and strategies over 2 sessions.  Baseline: 06-13-22 Goal status: not met   3.  Pt will teach back prescribed dysphagia exercises with rare min-A over 2 sessions Baseline: 06-08-22, 06-13-22 Goal status: met     LONG TERM GOALS: Target date: 07/12/2022   Pt and spouse will report ongoing compliance with prescribed HEP with mod-I over 1 week period Baseline:  Goal status: ongoing   2.  Using visual aids, pt with comply with targeted swallow precautions and compensations with no more than 2 verbal cues over 10 minute period.  Baseline:  Goal status: ongoing   3.  Pt will improve score on EAT-10 PROM by d/c, indicating subjective improvement in swallow function Baseline:  Goal status: ongoing   4.  Pt will participate in follow up MBSS following therapy course to objectively assess improvement in swallow function and safety.  Baseline:  Goal status: ongoing     PLAN: SLP FREQUENCY: 2x/week   SLP DURATION: 8 weeks   PLANNED INTERVENTIONS: Aspiration precaution training, Pharyngeal strengthening exercises, Diet  toleration management , Trials of upgraded texture/liquids, SLP instruction and feedback, Compensatory strategies, Patient/family education, and Re-evaluation   Su Monks, [place] 06/15/2022, 1:09 PM

## 2022-06-20 ENCOUNTER — Ambulatory Visit: Payer: Medicare PPO | Admitting: Speech Pathology

## 2022-06-20 ENCOUNTER — Ambulatory Visit: Payer: Medicare PPO

## 2022-06-20 DIAGNOSIS — R1312 Dysphagia, oropharyngeal phase: Secondary | ICD-10-CM

## 2022-06-20 DIAGNOSIS — R1310 Dysphagia, unspecified: Secondary | ICD-10-CM | POA: Diagnosis not present

## 2022-06-20 DIAGNOSIS — R2681 Unsteadiness on feet: Secondary | ICD-10-CM

## 2022-06-20 DIAGNOSIS — R2689 Other abnormalities of gait and mobility: Secondary | ICD-10-CM | POA: Diagnosis not present

## 2022-06-20 DIAGNOSIS — M6281 Muscle weakness (generalized): Secondary | ICD-10-CM

## 2022-06-20 NOTE — Therapy (Signed)
OUTPATIENT SPEECH LANGUAGE PATHOLOGY DYSPHAGIA TREATMENT NOTE / PROGRESS NOTE   Patient Name: CECILE GUEVARA MRN: [id number] DOB:February 20, 1941, 81 y.o., male Today's Date: 06/20/2022  PCP: Pleas Koch, NP  REFERRING PROVIDER: Pleas Koch, NP   END OF SESSION:   End of Session - 06/20/22 1328     Visit Number 10    Number of Visits 17    Date for SLP Re-Evaluation 07/12/22    Authorization Type [employer]    Progress Note Due on Visit 10    SLP Start Time 1400    SLP Stop Time  1435    SLP Time Calculation (min) 35 min    Activity Tolerance Patient tolerated treatment well                     Past Medical History:  Diagnosis Date   Basal cell carcinoma 11/09/2019   nod & infil-behind right ear-cx3 &exc   Basal cell carcinoma 03/21/2020   Residual BCC with peripheral margin involved - ST recommends [facility]   Carotid artery occlusion    Coronary artery disease    s/p CABG 2005; sees Dr Johnsie Cancel yearly   Diabetes mellitus without complication ([place])    Gynecomastia    Hypercholesterolemia    Hypertension    Neurodermatitis    Overweight(278.02)    Personal history of colonic polyps 10/08/2006   tubular adenomas   Renal insufficiency    Stroke [facility])    TIA  April 2022   Transient ischemic attack 2010   "lasted ~ 5 seconds"   Vitamin D deficiency    Past Surgical History:  Procedure Laterality Date   CARDIAC CATHETERIZATION  02/2004   "tried to stent; couldn't"   CAROTID ENDARTERECTOMY Right 08/26/2015   COLONOSCOPY     CORONARY ANGIOPLASTY     CORONARY ARTERY BYPASS GRAFT  Feb. 2005   4 vessel   ENDARTERECTOMY Right 08/26/2015   Procedure: Right Carotid ENDARTERECTOMY with Patch Angioplasty ;  Surgeon: Rosetta Posner, MD;  Location: [place];  Service: Vascular;  Laterality: Right;   ENDARTERECTOMY Left 05/18/2021   Procedure: LEFT CAROTID ARTERY ENDARTERECTOMY  with patch angioplasty;  Surgeon: Rosetta Posner, MD;  Location: [place];  Service:  Vascular;  Laterality: Left;   EYE SURGERY     bilateral cataract   KELOID EXCISION  04/2008   on chest scar; Dr. Dessie Coma   KELOID EXCISION     PILONIDAL CYST EXCISION  1989   Patient Active Problem List   Diagnosis Date Noted   Depression 03/22/2022   Stroke-like symptoms 03/01/2022   History of CVA (cerebrovascular accident) 02/28/2022   Aspiration pneumonia vs. 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 No  SPEECH THERAPY PROGRESS NOTE  Dates of Reporting Period: 05-17-2022 to 06-20-2022  Objective Reports of Subjective Statement: Mr. Navarrete has been seen for 10 ST visits targeting orapharyngeal dysphagia. Pt reports subjective improvement of swallow function and moderate compliance with established HEP.   Objective Measurements: Pt with usual daily completion of pharyngeal strengthening HEP, per pt and spouse report. SLP has recommended BID completion. Demonstrating marked decrease in overt s/sx of aspiration during thin liquid trials during therapy sessions. Continues to require min to mod -A for implementation of recommended swallowing compensations.   Goal Update: see below  Plan: continue per POC; repeat MBSS.   Reason Skilled Services are Required: To facilitate safety and efficacy when swallowing thin liquid boluses.    OBJECTIVE:   RECOMMENDATIONS FROM OBJECTIVE SWALLOW STUDY (MBSS/FEES):  regular diet Objective swallow impairments: reduced posterior bolus propulsion, reduced bolus cohesion, and a pharyngeal delay. Penetration (PAS 3, 5) was noted with thin and nectar thick liquids via cup and straw and aspiration (PAS 7, 8) was observed with consecutive swallows of thin liquids, with thin liquids via cup when the pill was taken and with consecutive swallows of nectar thick liquids. Instances of aspiration inconsistently triggered delayed coughing and this was noted most frequently with larger amounts of aspiration. Coughing was ineffective in expelling the aspirate, but cued coughing did expel penetrated material. With thin and nectar thick liquids, penetration and aspiration were eliminated with a chin tuck posture with left head turn and use of individual swallows. Laryngeal invasion was not observed with individual swallows of thin liquids when pt was cued to swallow on the count of three. Laryngeal invasion was also improved to a functional level of (PAS 2) with  smaller boluses of thin liquids. No instances of penetration/aspiration were noted with honey thick liquids. Objective recommended compensations: cup, no straw; meds with puree, small bite/sips, slow rate  TODAY'S TREATMENT:   DYSPHAGIA TREATMENT:   Current diet: regular and honey thick liquids Swallow precautions: small bites, small sips, alternate bites and sips, chin tuck, head turn, stay upright 30 minutes post meal, hard swallow, no straws, and avoid mixed consistencies Patient directly observed with POs: Yes: thin liquids and honey thick liquids   Feeding: able to feed self Liquids provided by: cup and straw  Oral phase signs and symptoms:  n/a Pharyngeal phase signs and symptoms: immediate cough and delayed cough  Therapeutic exercises: Effortful Swallow Types of cueing: verbal Amount of cueing: modified independent Treatment comments: Pt reports little to none coughing with milkshake or nighttime sneaks of thin liquid. Pt reports sometimes it feels globus sensation at time. Likely 2/2 post nasal drip. SLP recommends using liquid wash and hard swallow to attempt to clear when occurring. Oral trials of thin liquids resultant in intermittent coughing in response to 40% of bolus trials. Wife reports concern regarding compliance of swallow exercises  as pt is reporting completion without having her watch. Unable to determine if pt is compliant d/t conflicting reports. Education on recommendation for repeat MBSS, pt resistant but SLP educates on need to objectively assess swallow fx and efficacy and recommend continuation of diet modifications vs diet advancement. Pt in agreement.   HOME EXERCISE PROGRAM: Exercises: Effortful Swallow 20-30 repetitions, 3 x/day, Masako 10 repetitions, 3 x/day, Shaker 10 repetitions, 3 x/day, and EMST 10 repetitions, 2 x/day  PATIENT EDUCATION: Education details: see above Person educated: Patient and Spouse Education method: Explanation, Demonstration, Verbal  cues, and Handouts Education comprehension: verbalized understanding, returned demonstration, and needs further education     ASSESSMENT:   CLINICAL IMPRESSION: Patient is a 81 y.o. M who was seen today for dysphagia and dysarthria. Instrumental swallow study demonstrates penetration and aspiration with thin and nectar thick liquids. Impairments in swallow include reduced posterior bolus propulsion, reduced bolus cohesion, and a pharyngeal delay. Chin tuck and head turn to left reduce laryngeal penetration, however pt with difficulty recalling to follow this protocol.  Wife, [name], is thickening all liquids for pt. Ongoing education and training of dysphagia exercises completed, with inconsistent compliance and subjective reported improvement in swallow function at home. Recommend f/u MBSS at this time d/t decreased overt s/sx of aspiration with thin liquid trials and no adverse respiratory presentations despite having thin liquids at home, with likely noncompliance of chin-tuck, head turn compensation. Order placed.    OBJECTIVE IMPAIRMENTS include dysarthria and dysphagia. These impairments are limiting patient from safety when swallowing and effectively communicating at home and in community. Factors affecting potential to achieve goals and functional outcome are ability to learn/carryover information and previous level of function. Patient will benefit from skilled SLP services to address above impairments and improve overall function.   REHAB POTENTIAL: Fair       GOALS: Goals reviewed with patient? Yes   SHORT TERM GOALS: Target date: 06/14/2022     Pt and wife will report compliance with HEP frequency and repetitions with mod-I over 2 sessions. Baseline: 06-08-22, 06-13-22 Goal status: partially met   2.  Pt and wife will report implementation of visual aids at home to support pt recall of targeted swallowing compensations and strategies over 2 sessions.  Baseline: 06-13-22 Goal status:  not met   3.  Pt will teach back prescribed dysphagia exercises with rare min-A over 2 sessions Baseline: 06-08-22, 06-13-22 Goal status: met     LONG TERM GOALS: Target date: 07/12/2022   Pt and spouse will report ongoing compliance with prescribed HEP with mod-I over 1 week period Baseline:  Goal status: ongoing   2.  Using visual aids, pt with comply with targeted swallow precautions and compensations with no more than 2 verbal cues over 10 minute period.  Baseline:  Goal status: ongoing   3.  Pt will improve score on EAT-10 PROM by d/c, indicating subjective improvement in swallow function Baseline:  Goal status: ongoing   4.  Pt will participate in follow up MBSS following therapy course to objectively assess improvement in swallow function and safety.  Baseline:  Goal status: ongoing     PLAN: SLP FREQUENCY: 2x/week   SLP DURATION: 8 weeks   PLANNED INTERVENTIONS: Aspiration precaution training, Pharyngeal strengthening exercises, Diet toleration management , Trials of upgraded texture/liquids, SLP instruction and feedback, Compensatory strategies, Patient/family education, and Re-evaluation   Su Monks, [place] 06/20/2022, 1:28 PM

## 2022-06-20 NOTE — Therapy (Signed)
OUTPATIENT PHYSICAL THERAPY TREATMENT NOTE   Patient Name: [name] [name] MRN: [id number] DOB:[birth year], 81 y.o., male Today's Date: 06/20/2022  PCP: Pleas Koch, NP, Josue Hector, MD REFERRING PROVIDER: Pleas Koch, NP    END OF SESSION:   PT End of Session - 06/20/22 1325     Visit Number 13    Number of Visits 16    Date for PT Re-Evaluation 06/27/22    Authorization Type [employer] - will need auth    Progress Note Due on Visit 10    PT Start Time 1320    PT Stop Time 1400    PT Time Calculation (min) 40 min    Activity Tolerance Patient tolerated treatment well    Behavior During Therapy [facility] for tasks assessed/performed;Flat affect               Past Medical History:  Diagnosis Date   Basal cell carcinoma 11/09/2019   nod & infil-behind right ear-cx3 &exc   Basal cell carcinoma 03/21/2020   Residual BCC with peripheral margin involved - [facility]   Carotid artery occlusion    Coronary artery disease    s/p CABG 2005; sees Dr Johnsie Cancel yearly   Diabetes mellitus without complication ([place])    Gynecomastia    Hypercholesterolemia    Hypertension    Neurodermatitis    Overweight(278.02)    Personal history of colonic polyps 10/08/2006   tubular adenomas   Renal insufficiency    Stroke [facility])    TIA  April 2022   Transient ischemic attack 2010   "lasted ~ 5 seconds"   Vitamin D deficiency    Past Surgical History:  Procedure Laterality Date   CARDIAC CATHETERIZATION  02/2004   "tried to stent; couldn't"   CAROTID ENDARTERECTOMY Right 08/26/2015   COLONOSCOPY     CORONARY ANGIOPLASTY     CORONARY ARTERY BYPASS GRAFT  Feb. 2005   4 vessel   ENDARTERECTOMY Right 08/26/2015   Procedure: Right Carotid ENDARTERECTOMY with Patch Angioplasty ;  Surgeon: Rosetta Posner, MD;  Location: [place];  Service: Vascular;  Laterality: Right;   ENDARTERECTOMY Left 05/18/2021   Procedure: LEFT CAROTID ARTERY ENDARTERECTOMY  with patch angioplasty;   Surgeon: Rosetta Posner, MD;  Location: [place];  Service: Vascular;  Laterality: Left;   EYE SURGERY     bilateral cataract   KELOID EXCISION  04/2008   on chest scar; Dr. Dessie Coma   KELOID EXCISION     PILONIDAL CYST EXCISION  1989   Patient Active Problem List   Diagnosis Date Noted   Depression 03/22/2022   Stroke-like symptoms 03/01/2022   History of CVA (cerebrovascular accident) 02/28/2022   Aspiration pneumonia vs. 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   Carotid artery disease ([place]) 07/01/2012   Special screening for malignant neoplasm of prostate 05/08/2011   Vitamin D deficiency 11/20/2009   Transient cerebral ischemia 06/30/2009   CEREBROVASCULAR DISEASE 06/30/2009   COLONIC POLYPS 01/10/2008  Essential hypertension 01/10/2008   Coronary atherosclerosis 01/10/2008   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   GYNECOMASTIA, UNILATERAL 01/10/2008   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   SHOULDER PAIN 01/10/2008   Type 2 diabetes mellitus (HCC) 01/10/2008    REFERRING DIAG:  Z86.73 (ICD-10-CM) - History of CVA (cerebrovascular accident) I63.81 (ICD-10-CM) - 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HCC)    THERAPY DIAG:  Other abnormalities of gait and mobility  Unsteadiness on feet  Muscle weakness (generalized)  PERTINENT HISTORY: HTN, HLD, DMII, CAD, carotid artery disease s/p R CEA and history of 2 strokes and 1 TIA   PRECAUTIONS: Fall, Liquids: Honey  thick, heart monitor   SUBJECTIVE: No new changes. They have not walked due to weather. Pt's wife inquiring about using rolator so he can sit and rest. PAIN:  Are you having pain? No  There were no vitals filed for this visit.    OBJECTIVE: (objective measures completed at initial evaluation unless otherwise dated)    TODAY'S TREATMENT:  Gait training: trial of Rolator: 1 x 250', cues given patient to concentrate on lifting his L leg during swing phase, pt is unable to concentrate for more than 5-10 seconds as he easily gets distracted with environment and then he starts to have flatfoot/toe contact with L foot on floor. Pt also given cues to stand up tall to improve posture with walking. Pt was asked to see if he can be aware that he is hitting his left toes during heel contact. Due to impaired attention, pt is not able to focus on quality of gait without verbal cues. This may be slowly getting better. Pt unable to control Rolator if he looses balance. Wife and pt educated that Rolator is not a good option at this time and he needs to continue to use regular 2 WW at home for safety. Fwd step ups: 1st step on stairs: 10x R and L, 2nd step on stairs: 10x R and L, bil UE support for both Seated hamstring unilateral scoots: pt sitting on rolator: 3 x 10 R and L Seated hamstring walks: pt sitting on Rolator: black sport cord: 80 feet   PATIENT EDUCATION: Education details: See above  Person educated: Patient and Spouse Education method: Explanation  Education comprehension: verbalized understanding and needs further education      HOME EXERCISE PROGRAM: Access Code: [email or web address] URL: [email or web address] Date: 05/11/2022 Prepared by: Jannah Plaster  Exercises - Sit to Stand Without Arm Support  - 1 x daily - 7 x weekly - 3 sets - 5-7 reps - Heel Raise on Step  - 1 x daily - 7 x weekly - 3 sets - 10 reps - Forward Step Down with Heel Tap and Rail Support  - 1 x daily - 7 x  weekly - 3 sets - 10 reps       GOALS: Goals reviewed with patient? Yes   SHORT TERM GOALS: Target date: 05/30/2022   Patient will demo 0.7m/s walking speed with walker to improve gait speed and community ambulation Baseline: 0.50 m/s with walker; 0.55m/s with walker(06/01/22)  Goal status: not met   2.  Patient will be able to perform sit to stand without HHA to improve functional strength Baseline: Requires 1-2 HHA from bed or chair to stand up (eval); able to do 2 reps without HHA but requires multiple attempt to stand up Goal status: Goal met.   3.  Patient will report 50% compliance with HEP to improve self management of symptoms Baseline: HEP to   be issued 2nd visit Goal status: goal met.     LONG TERM GOALS: Target date: 06/27/2022   Patient will demo gait speed of >0.77ms with LRAD to improve community ambulation and decrease fall risk Baseline: 0.5 m/s with walker; 0.55 m/s (06/01/22) Goal status: progressing   2.  Pt will demo BBS of >50/56 to reduce fall risk Baseline: 42/56 (05/02/22); 50/56 (06/08/22) Goal status: goal met   3.  Patient will demo TUG <15 seconds with LRAD to improve functional mobility Baseline: 21 sec with RW; 26 sec with RW (06/01/22); 23 sec with st. Cane (06/02/22) Goal status: Progressing    4. Patient will report 10 points of improvement on FOTO to improve overall function Baseline: 54 (06/08/22) Goal status: progressing   ASSESSMENT:   CLINICAL IMPRESSION: Today's session focused on testing his gait with Rolator for community walking per request of wife. Pt is not safe to use Rolator so pt and wife were recommended to continue to use regular walker for home and community ambulation right now.      OBJECTIVE IMPAIRMENTS Abnormal gait, decreased activity tolerance, decreased balance, decreased endurance, decreased mobility, difficulty walking, decreased ROM, decreased strength, impaired flexibility, improper body mechanics, and postural dysfunction.     ACTIVITY LIMITATIONS cleaning, community activity, and driving.    PERSONAL FACTORS Age, Past/current experiences, Time since onset of injury/illness/exacerbation, and 1-2 comorbidities: TN, HLD, DMII, CAD, carotid artery disease s/p R CEA and history of multiple strokes  are also affecting patient's functional outcome.      REHAB POTENTIAL: Good   CLINICAL DECISION MAKING: Stable/uncomplicated   EVALUATION COMPLEXITY: Low   PLAN: PT FREQUENCY: 2x/week   PT DURATION: 8 weeks   PLANNED INTERVENTIONS: Therapeutic exercises, Therapeutic activity, Neuromuscular re-education, Balance training, Gait training, Patient/Family education, Joint mobilization, Stair training, Orthotic/Fit training, Cryotherapy, Moist heat, and Manual therapy   PLAN FOR NEXT SESSION: Reassess/Recertify next sessions, Continue to work on BBS components, gait training, balance.  KKerrie Pleasure PT, DPT 06/20/2022, 4:20 PM

## 2022-06-20 NOTE — Patient Instructions (Signed)
Hard Swallow (30) Tongue Out (15) EMST (15)  Wed PM        Thu AM        Thu PM        Fri AM        Fri PM        Sat AM        Sat PM        Sun AM        Sun PM        Mon AM        Mon PM        Tue AM        Tue PM        Wed AM

## 2022-06-21 ENCOUNTER — Other Ambulatory Visit (HOSPITAL_COMMUNITY): Payer: Self-pay

## 2022-06-21 DIAGNOSIS — R1310 Dysphagia, unspecified: Secondary | ICD-10-CM

## 2022-06-22 ENCOUNTER — Ambulatory Visit: Payer: Medicare PPO

## 2022-06-22 ENCOUNTER — Ambulatory Visit: Payer: Medicare PPO | Admitting: Speech Pathology

## 2022-06-22 ENCOUNTER — Other Ambulatory Visit: Payer: Self-pay | Admitting: Primary Care

## 2022-06-22 DIAGNOSIS — M6281 Muscle weakness (generalized): Secondary | ICD-10-CM | POA: Diagnosis not present

## 2022-06-22 DIAGNOSIS — R1312 Dysphagia, oropharyngeal phase: Secondary | ICD-10-CM | POA: Diagnosis not present

## 2022-06-22 DIAGNOSIS — R2681 Unsteadiness on feet: Secondary | ICD-10-CM

## 2022-06-22 DIAGNOSIS — R269 Unspecified abnormalities of gait and mobility: Secondary | ICD-10-CM

## 2022-06-22 DIAGNOSIS — R2689 Other abnormalities of gait and mobility: Secondary | ICD-10-CM | POA: Diagnosis not present

## 2022-06-22 DIAGNOSIS — R1310 Dysphagia, unspecified: Secondary | ICD-10-CM | POA: Diagnosis not present

## 2022-06-22 NOTE — Therapy (Signed)
OUTPATIENT SPEECH LANGUAGE PATHOLOGY DYSPHAGIA TREATMENT NOTE    Patient Name: [name] [name] MRN: [id number] DOB:1941/06/12, 81 y.o., male Today's Date: 06/22/2022  PCP: Doreene Nest, NP  REFERRING PROVIDER: Doreene Nest, NP   END OF SESSION:   End of Session - 06/22/22 1257     Visit Number 11    Number of Visits 17    Date for SLP Re-Evaluation 07/12/22    Authorization Type [employer]    Progress Note Due on Visit 10    SLP Start Time 1318    SLP Stop Time  1351    SLP Time Calculation (min) 33 min    Activity Tolerance Patient tolerated treatment well                     Past Medical History:  Diagnosis Date   Basal cell carcinoma 11/09/2019   nod & infil-behind right ear-cx3 &exc   Basal cell carcinoma 03/21/2020   Residual BCC with peripheral margin involved - ST recommends [facility]   Carotid artery occlusion    Coronary artery disease    s/p CABG 2005; sees Dr Eden Emms yearly   Diabetes mellitus without complication (HCC)    Gynecomastia    Hypercholesterolemia    Hypertension    Neurodermatitis    Overweight(278.02)    Personal history of colonic polyps 10/08/2006   tubular adenomas   Renal insufficiency    Stroke [facility])    TIA  April 2022   Transient ischemic attack 2010   "lasted ~ 5 seconds"   Vitamin D deficiency    Past Surgical History:  Procedure Laterality Date   CARDIAC CATHETERIZATION  02/2004   "tried to stent; couldn't"   CAROTID ENDARTERECTOMY Right 08/26/2015   COLONOSCOPY     CORONARY ANGIOPLASTY     CORONARY ARTERY BYPASS GRAFT  Feb. 2005   4 vessel   ENDARTERECTOMY Right 08/26/2015   Procedure: Right Carotid ENDARTERECTOMY with Patch Angioplasty ;  Surgeon: Larina Earthly, MD;  Location: [facility] OR;  Service: Vascular;  Laterality: Right;   ENDARTERECTOMY Left 05/18/2021   Procedure: LEFT CAROTID ARTERY ENDARTERECTOMY  with patch angioplasty;  Surgeon: Larina Earthly, MD;  Location: [facility] OR;  Service: Vascular;   Laterality: Left;   EYE SURGERY     bilateral cataract   KELOID EXCISION  04/2008   on chest scar; Dr. Stephens November   KELOID EXCISION     PILONIDAL CYST EXCISION  1989   Patient Active Problem List   Diagnosis Date Noted   Depression 03/22/2022   Stroke-like symptoms 03/01/2022   History of CVA (cerebrovascular accident) 02/28/2022   Aspiration pneumonia vs. CAP 02/28/2022   Acute cough 02/26/2022   Upper respiratory tract infection 02/26/2022   Pain due to onychomycosis of toenails of both feet 02/13/2022   Blood clotting disorder (HCC) 02/13/2022   History of COVID-19 01/08/2022   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   TIA (transient ischemic attack) 04/03/2021   Lower extremity edema 08/13/2019   Unsteady gait 06/09/2019   Trigger ring finger of right hand 06/02/2018   Ceruminosis, bilateral 09/16/2017   Hyperlipidemia 01/30/2016   Carotid stenosis 08/26/2015   Anxiety, 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   Carotid artery disease (HCC) 07/01/2012   Special screening for malignant neoplasm of prostate 05/08/2011   Vitamin D deficiency 11/20/2009   Transient cerebral ischemia 06/30/2009   CEREBROVASCULAR DISEASE 06/30/2009   COLONIC POLYPS 01/10/2008   Essential hypertension 01/10/2008   Coronary atherosclerosis 01/10/2008  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   GYNECOMASTIA, UNILATERAL 01/10/2008   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   SHOULDER PAIN 01/10/2008   Type 2 diabetes mellitus (HCC) 01/10/2008    ONSET DATE: 02/28/2022    REFERRING DIAG: N82.95 (ICD-10-CM) - History of CVA (cerebrovascular accident) R13.10 (ICD-10-CM) - Dysphagia, unspecified type J69.0 (ICD-10-CM) - Aspiration pneumonia, unspecified aspiration pneumonia type, unspecified laterality, unspecified part of lung (HCC)   THERAPY DIAG: Dysphagia, oropharyngeal phase  SUBJECTIVE:   SUBJECTIVE STATEMENT: Is scheduled for f/u MBSS July 02, 2022  PAIN:  Are you having pain? No  OBJECTIVE:    RECOMMENDATIONS FROM OBJECTIVE SWALLOW STUDY (MBSS/FEES):  regular diet Objective swallow impairments: reduced posterior bolus propulsion, reduced bolus cohesion, and a pharyngeal delay. Penetration (PAS 3, 5) was noted with thin and nectar thick liquids via cup and straw and aspiration (PAS 7, 8) was observed with consecutive swallows of thin liquids, with thin liquids via cup when the pill was taken and with consecutive swallows of nectar thick liquids. Instances of aspiration inconsistently triggered delayed coughing and this was noted most frequently with larger amounts of aspiration. Coughing was ineffective in expelling the aspirate, but cued coughing did expel penetrated material. With thin and nectar thick liquids, penetration and aspiration were eliminated with a chin tuck posture with left head turn and use of individual swallows. Laryngeal invasion was not observed with individual swallows of thin liquids when pt was cued to swallow on the count of three. Laryngeal invasion was also improved to a functional level of (PAS 2) with smaller boluses of thin liquids. No instances of penetration/aspiration were noted with honey thick liquids. Objective recommended compensations: cup, no straw; meds with puree, small bite/sips, slow rate  TODAY'S TREATMENT:   DYSPHAGIA TREATMENT:   Current diet: regular and honey thick liquids Swallow precautions: small bites, small sips, alternate bites and sips, chin tuck, head turn, stay upright 30 minutes post meal, hard swallow, no straws, and avoid mixed consistencies Patient directly observed with POs: Yes: thin liquids and honey thick liquids   Feeding: able to feed self Liquids provided by: straw  Oral phase signs and symptoms:  n/a Pharyngeal phase signs and symptoms: immediate cough  Therapeutic exercises: Effortful Swallow Types of cueing: verbal Amount of cueing: maximal Treatment comments: Trained small sip, hold and swallow technique with thin  liquid boluses. Use of visual cue, with pt using the hold and swallow in all trials. Difficulty in maintaining appropriate bolus size, takes small sip in ~50% of trials per pt report. When taking larger sip, pt demonstrating immediate cough. Pt determines self-cueing will be effective strategy to maintain small sip size. Requires verbal cue to utilize self-selected strategy. SLP providing max-A through verbal cues and referencing visual aid. SLP recommends consistent use of visual cues incorporated at home, either on cup or taped to table where eating/drinking, if able. Reviewed chin tuck, head turn strategy for larger bolus trials. Education on using that strategy if pt takes in too much liquid.   HOME EXERCISE PROGRAM: Exercises: Effortful Swallow 20-30 repetitions, 3 x/day, [place] 10 repetitions, 3 x/day, and EMST 10 repetitions, 2 x/day  PATIENT EDUCATION: Education details: see above Person educated: Patient and Spouse Education method: Explanation, Demonstration, Verbal cues, and Handouts Education comprehension: verbalized understanding, returned demonstration, and needs further education     ASSESSMENT:   CLINICAL IMPRESSION: Patient is a 81 y.o. M who was seen today for dysphagia and dysarthria. Instrumental swallow study demonstrates penetration and aspiration with thin and  nectar thick liquids. Impairments in swallow include reduced posterior bolus propulsion, reduced bolus cohesion, and a pharyngeal delay. Chin tuck and head turn to left reduce laryngeal penetration, however pt with difficulty recalling to follow this protocol.  Wife, Dwana Curd, is thickening all liquids for pt. Ongoing education and training of dysphagia exercises completed, with inconsistent compliance and subjective reported improvement in swallow function at home. Recommend f/u MBSS at this time d/t decreased overt s/sx of aspiration with thin liquid trials and no adverse respiratory presentations despite having thin  liquids at home, with likely noncompliance of chin-tuck, head turn compensation. Scheduled for f/u MBSS next week.   OBJECTIVE IMPAIRMENTS include dysarthria and dysphagia. These impairments are limiting patient from safety when swallowing and effectively communicating at home and in community. Factors affecting potential to achieve goals and functional outcome are ability to learn/carryover information and previous level of function. Patient will benefit from skilled SLP services to address above impairments and improve overall function.   REHAB POTENTIAL: Fair       GOALS: Goals reviewed with patient? Yes   SHORT TERM GOALS: Target date: 06/14/2022     Pt and wife will report compliance with HEP frequency and repetitions with mod-I over 2 sessions. Baseline: 06-08-22, 06-13-22 Goal status: partially met   2.  Pt and wife will report implementation of visual aids at home to support pt recall of targeted swallowing compensations and strategies over 2 sessions.  Baseline: 06-13-22 Goal status: not met   3.  Pt will teach back prescribed dysphagia exercises with rare min-A over 2 sessions Baseline: 06-08-22, 06-13-22 Goal status: met     LONG TERM GOALS: Target date: 07/12/2022   Pt and spouse will report ongoing compliance with prescribed HEP with mod-I over 1 week period Baseline:  Goal status: ongoing   2.  Using visual aids, pt with comply with targeted swallow precautions and compensations with no more than 2 verbal cues over 10 minute period.  Baseline:  Goal status: ongoing   3.  Pt will improve score on EAT-10 PROM by d/c, indicating subjective improvement in swallow function Baseline:  Goal status: ongoing   4.  Pt will participate in follow up MBSS following therapy course to objectively assess improvement in swallow function and safety.  Baseline:  Goal status: ongoing     PLAN: SLP FREQUENCY: 2x/week   SLP DURATION: 8 weeks   PLANNED INTERVENTIONS: Aspiration  precaution training, Pharyngeal strengthening exercises, Diet toleration management , Trials of upgraded texture/liquids, SLP instruction and feedback, Compensatory strategies, Patient/family education, and Re-evaluation   Maia Breslow, CCC-SLP 06/22/2022, 12:58 PM

## 2022-06-27 ENCOUNTER — Ambulatory Visit: Payer: Medicare PPO | Admitting: Physical Therapy

## 2022-06-27 ENCOUNTER — Ambulatory Visit: Payer: Medicare PPO

## 2022-06-27 VITALS — BP 146/72 | HR 63

## 2022-06-27 DIAGNOSIS — M6281 Muscle weakness (generalized): Secondary | ICD-10-CM

## 2022-06-27 DIAGNOSIS — R2681 Unsteadiness on feet: Secondary | ICD-10-CM | POA: Diagnosis not present

## 2022-06-27 DIAGNOSIS — R1312 Dysphagia, oropharyngeal phase: Secondary | ICD-10-CM | POA: Diagnosis not present

## 2022-06-27 DIAGNOSIS — R1310 Dysphagia, unspecified: Secondary | ICD-10-CM | POA: Diagnosis not present

## 2022-06-27 DIAGNOSIS — R2689 Other abnormalities of gait and mobility: Secondary | ICD-10-CM

## 2022-06-27 NOTE — Therapy (Signed)
OUTPATIENT SPEECH LANGUAGE PATHOLOGY DYSPHAGIA TREATMENT NOTE    Patient Name: [name] HOPFENSPERGER MRN: [id number] DOB:05-28-1941, 81 y.o., male Today's Date: 06/27/2022  PCP: Pleas Koch, NP  REFERRING PROVIDER: Pleas Koch, NP   END OF SESSION:   End of Session - 06/27/22 1447     Visit Number 12    Number of Visits 17    Date for SLP Re-Evaluation 07/12/22    Authorization Type [employer]    SLP Start Time 1446    SLP Stop Time  3151    SLP Time Calculation (min) 44 min    Activity Tolerance Patient tolerated treatment well                 Past Medical History:  Diagnosis Date   Basal cell carcinoma 11/09/2019   nod & infil-behind right ear-cx3 &exc   Basal cell carcinoma 03/21/2020   Residual BCC with peripheral margin involved - ST recommends [facility]   Carotid artery occlusion    Coronary artery disease    s/p CABG 2005; sees Dr Johnsie Cancel yearly   Diabetes mellitus without complication ([place])    Gynecomastia    Hypercholesterolemia    Hypertension    Neurodermatitis    Overweight(278.02)    Personal history of colonic polyps 10/08/2006   tubular adenomas   Renal insufficiency    Stroke [facility])    TIA  April 2022   Transient ischemic attack 2010   "lasted ~ 5 seconds"   Vitamin D deficiency    Past Surgical History:  Procedure Laterality Date   CARDIAC CATHETERIZATION  02/2004   "tried to stent; couldn't"   CAROTID ENDARTERECTOMY Right 08/26/2015   COLONOSCOPY     CORONARY ANGIOPLASTY     CORONARY ARTERY BYPASS GRAFT  Feb. 2005   4 vessel   ENDARTERECTOMY Right 08/26/2015   Procedure: Right Carotid ENDARTERECTOMY with Patch Angioplasty ;  Surgeon: Rosetta Posner, MD;  Location: [place];  Service: Vascular;  Laterality: Right;   ENDARTERECTOMY Left 05/18/2021   Procedure: LEFT CAROTID ARTERY ENDARTERECTOMY  with patch angioplasty;  Surgeon: Rosetta Posner, MD;  Location: [place];  Service: Vascular;  Laterality: Left;   EYE SURGERY     bilateral  cataract   KELOID EXCISION  04/2008   on chest scar; Dr. Dessie Coma   KELOID EXCISION     PILONIDAL CYST EXCISION  1989   Patient Active Problem List   Diagnosis Date Noted   Depression 03/22/2022   Stroke-like symptoms 03/01/2022   History of CVA (cerebrovascular accident) 02/28/2022   Aspiration pneumonia vs. 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   Carotid artery disease ([place]) 07/01/2012   Special screening for malignant neoplasm of prostate 05/08/2011   Vitamin D deficiency 11/20/2009   Transient cerebral ischemia 06/30/2009   CEREBROVASCULAR DISEASE 06/30/2009   COLONIC POLYPS 01/10/2008   Essential hypertension 01/10/2008   Coronary atherosclerosis 01/10/2008   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   GYNECOMASTIA, UNILATERAL 01/10/2008   NEURODERMATITIS  01/10/2008   KELOID 01/10/2008   SHOULDER PAIN 01/10/2008   Type 2 diabetes mellitus ([place]) 01/10/2008    ONSET DATE: 02/28/2022    REFERRING DIAG: G81.15 (ICD-10-CM) - History of CVA (cerebrovascular accident) R13.10 (ICD-10-CM) - Dysphagia, unspecified type J69.0 (ICD-10-CM) - Aspiration pneumonia, unspecified aspiration pneumonia type, unspecified laterality, unspecified part of lung ([place])   THERAPY DIAG: Dysphagia, oropharyngeal phase  SUBJECTIVE:   SUBJECTIVE STATEMENT: "It's getting better" re: swallow function  PAIN:  Are you having pain? No  OBJECTIVE:   RECOMMENDATIONS FROM OBJECTIVE SWALLOW  STUDY (MBSS/FEES):  regular diet Objective swallow impairments: reduced posterior bolus propulsion, reduced bolus cohesion, and a pharyngeal delay. Penetration (PAS 3, 5) was noted with thin and nectar thick liquids via cup and straw and aspiration (PAS 7, 8) was observed with consecutive swallows of thin liquids, with thin liquids via cup when the pill was taken and with consecutive swallows of nectar thick liquids. Instances of aspiration inconsistently triggered delayed coughing and this was noted most frequently with larger amounts of aspiration. Coughing was ineffective in expelling the aspirate, but cued coughing did expel penetrated material. With thin and nectar thick liquids, penetration and aspiration were eliminated with a chin tuck posture with left head turn and use of individual swallows. Laryngeal invasion was not observed with individual swallows of thin liquids when pt was cued to swallow on the count of three. Laryngeal invasion was also improved to a functional level of (PAS 2) with smaller boluses of thin liquids. No instances of penetration/aspiration were noted with honey thick liquids. Objective recommended compensations: cup, no straw; meds with puree, small bite/sips, slow rate  TODAY'S TREATMENT:   DYSPHAGIA TREATMENT:   Current diet: regular and honey thick liquids Swallow precautions: small bites, small sips, alternate bites and sips, chin tuck, head turn, stay upright 30 minutes post meal, hard swallow, no straws, and avoid mixed consistencies Patient directly observed with POs: Yes: thin liquids and honey thick liquids   Feeding: able to feed self Liquids provided by: cup and straw  Oral phase signs and symptoms:  n/a Pharyngeal phase signs and symptoms: delayed throat clear and immediate cough  Therapeutic exercises: Effortful Swallow and Masako Types of cueing: verbal Amount of cueing: maximal for PO trials Treatment comments: Conducted PO trials of thin liquids via  cup and straw with focus on utilizing small/single sips, bolus hold, then swallow for increased volitional control of swallow function. Pt immediately coughed with 2/6 trials of cup sips and 2/9 trials of straw sips given usual cues and prompting for carryover. Subjectively improved cough strength exhibited. Poor independent recall and implementation of chin tuck/head tilt observed, in which adaptive feeding equipment may be effective for bolus management to increase carryover at home if recommended at upcoming MBSS. Pt completed Effortful swallows x15 and Masako swallows x10 with rare min A for increased effort and accuracy. EMST not provided today.   HOME EXERCISE PROGRAM: Exercises: Effortful Swallow 20-30 repetitions, 3 x/day, Masako 10 repetitions, 3 x/day, and EMST 10 repetitions, 2 x/day  PATIENT EDUCATION: Education details: see above Person educated: Patient and Spouse Education method: Explanation, Demonstration, Verbal cues, and Handouts Education comprehension: verbalized understanding, returned demonstration, and needs further education     ASSESSMENT:   CLINICAL IMPRESSION: Patient is a 81 y.o. M who was seen today for dysphagia and dysarthria. Instrumental swallow study demonstrates penetration and aspiration with thin and nectar thick liquids. Impairments in swallow include reduced posterior bolus propulsion, reduced bolus cohesion, and a pharyngeal  delay. Chin tuck and head turn to left reduce laryngeal penetration, however pt with difficulty recalling to follow this protocol.  Wife, [name], is thickening all liquids for pt. Ongoing education and training of dysphagia exercises completed, with inconsistent compliance and subjective reported improvement in swallow function at home. Recommend f/u MBSS at this time d/t decreased overt s/sx of aspiration with thin liquid trials and no adverse respiratory presentations despite having thin liquids at home, with likely noncompliance of  chin-tuck, head turn compensation. Scheduled for f/u MBSS next week.   OBJECTIVE IMPAIRMENTS include dysarthria and dysphagia. These impairments are limiting patient from safety when swallowing and effectively communicating at home and in community. Factors affecting potential to achieve goals and functional outcome are ability to learn/carryover information and previous level of function. Patient will benefit from skilled SLP services to address above impairments and improve overall function.   REHAB POTENTIAL: Fair       GOALS: Goals reviewed with patient? Yes   SHORT TERM GOALS: Target date: 06/14/2022     Pt and wife will report compliance with HEP frequency and repetitions with mod-I over 2 sessions. Baseline: 06-08-22, 06-13-22 Goal status: partially met   2.  Pt and wife will report implementation of visual aids at home to support pt recall of targeted swallowing compensations and strategies over 2 sessions.  Baseline: 06-13-22 Goal status: not met   3.  Pt will teach back prescribed dysphagia exercises with rare min-A over 2 sessions Baseline: 06-08-22, 06-13-22 Goal status: met     LONG TERM GOALS: Target date: 07/12/2022   Pt and spouse will report ongoing compliance with prescribed HEP with mod-I over 1 week period Baseline:  Goal status: ongoing   2.  Using visual aids, pt with comply with targeted swallow precautions and compensations with no more than 2 verbal cues over 10 minute period.  Baseline:  Goal status: ongoing   3.  Pt will improve score on EAT-10 PROM by d/c, indicating subjective improvement in swallow function Baseline:  Goal status: ongoing   4.  Pt will participate in follow up MBSS following therapy course to objectively assess improvement in swallow function and safety.  Baseline:  Goal status: ongoing     PLAN: SLP FREQUENCY: 2x/week   SLP DURATION: 8 weeks   PLANNED INTERVENTIONS: Aspiration precaution training, Pharyngeal strengthening  exercises, Diet toleration management , Trials of upgraded texture/liquids, SLP instruction and feedback, Compensatory strategies, Patient/family education, and Re-evaluation   Marzetta Board, CCC-SLP 06/27/2022, 4:54 PM

## 2022-06-27 NOTE — Therapy (Signed)
OUTPATIENT PHYSICAL THERAPY TREATMENT NOTE   Patient Name: [name] [name] MRN: [id number] DOB:Mar 04, 1941, 81 y.o., male Today's Date: 06/27/2022  PCP: Pleas Koch, NP, Josue Hector, MD REFERRING PROVIDER: Pleas Koch, NP    END OF SESSION:   PT End of Session - 06/27/22 1533     Visit Number 15    Number of Visits 26    Date for PT Re-Evaluation 08/03/22    Authorization Type [employer] - will need auth    Progress Note Due on Visit 24    PT Start Time 1532    PT Stop Time 5993    PT Time Calculation (min) 42 min    Activity Tolerance Patient tolerated treatment well    Behavior During Therapy [facility] for tasks assessed/performed               Past Medical History:  Diagnosis Date   Basal cell carcinoma 11/09/2019   nod & infil-behind right ear-cx3 &exc   Basal cell carcinoma 03/21/2020   Residual BCC with peripheral margin involved - ST recommends [facility]   Carotid artery occlusion    Coronary artery disease    s/p CABG 2005; sees Dr Johnsie Cancel yearly   Diabetes mellitus without complication ([place])    Gynecomastia    Hypercholesterolemia    Hypertension    Neurodermatitis    Overweight(278.02)    Personal history of colonic polyps 10/08/2006   tubular adenomas   Renal insufficiency    Stroke [facility])    TIA  April 2022   Transient ischemic attack 2010   "lasted ~ 5 seconds"   Vitamin D deficiency    Past Surgical History:  Procedure Laterality Date   CARDIAC CATHETERIZATION  02/2004   "tried to stent; couldn't"   CAROTID ENDARTERECTOMY Right 08/26/2015   COLONOSCOPY     CORONARY ANGIOPLASTY     CORONARY ARTERY BYPASS GRAFT  Feb. 2005   4 vessel   ENDARTERECTOMY Right 08/26/2015   Procedure: Right Carotid ENDARTERECTOMY with Patch Angioplasty ;  Surgeon: Rosetta Posner, MD;  Location: [place];  Service: Vascular;  Laterality: Right;   ENDARTERECTOMY Left 05/18/2021   Procedure: LEFT CAROTID ARTERY ENDARTERECTOMY  with patch angioplasty;  Surgeon:  Rosetta Posner, MD;  Location: [place];  Service: Vascular;  Laterality: Left;   EYE SURGERY     bilateral cataract   KELOID EXCISION  04/2008   on chest scar; Dr. Dessie Coma   KELOID EXCISION     PILONIDAL CYST EXCISION  1989   Patient Active Problem List   Diagnosis Date Noted   Depression 03/22/2022   Stroke-like symptoms 03/01/2022   History of CVA (cerebrovascular accident) 02/28/2022   Aspiration pneumonia vs. 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   TIA (transient ischemic attack) 04/03/2021   Lower extremity edema 08/13/2019   Unsteady gait 06/09/2019   Trigger ring finger of right hand 06/02/2018   Ceruminosis, bilateral 09/16/2017   Hyperlipidemia 01/30/2016   Carotid stenosis 08/26/2015   Anxiety, 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   Carotid artery disease ([place]) 07/01/2012   Special screening for malignant neoplasm of prostate 05/08/2011   Vitamin D deficiency 11/20/2009   Transient cerebral ischemia 06/30/2009   CEREBROVASCULAR DISEASE 06/30/2009   COLONIC POLYPS 01/10/2008  Essential hypertension 01/10/2008   Coronary atherosclerosis 01/10/2008   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   GYNECOMASTIA, UNILATERAL 01/10/2008   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   SHOULDER PAIN 01/10/2008   Type 2 diabetes mellitus ([place]) 01/10/2008    REFERRING DIAG:  L24.40 (ICD-10-CM) - History of CVA (cerebrovascular accident) I63.81 (ICD-10-CM) - 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 Pt states he walked 15 minutes yesterday with no issues.   PAIN:  Are you having pain? No  Today's Vitals   06/27/22 1533 06/27/22 1549  BP: (!) 185/84 (!) 146/72  Pulse: 63       OBJECTIVE: (objective measures completed at initial evaluation unless otherwise dated)    TODAY'S TREATMENT:  Self-care/home management  BP discussion, contacting PCP  Gait training  Gait pattern: {gait characteristics:25376} Distance walked: *** Assistive device utilized: Environmental consultant - 4 wheeled Level of assistance: {Levels of assistance:24026} Comments: ***   PATIENT EDUCATION: Education details: See above  Person educated: Patient and Spouse Education method: Explanation  Education comprehension: verbalized understanding and needs further education      HOME EXERCISE PROGRAM: Access Code: [email or web address] URL: [email or web address] Date: 05/11/2022 Prepared by: Mickie Bail Loring Liskey  Exercises - Sit to Stand Without Arm Support  - 1 x daily - 7 x weekly - 3 sets - 5-7 reps - Heel Raise on Step  - 1 x daily - 7 x weekly - 3 sets - 10 reps - Forward Step Down with Heel Tap and Rail Support  - 1 x daily - 7 x weekly - 3 sets - 10 reps       GOALS: Goals reviewed with patient? Yes   SHORT TERM GOALS: Target date: 05/30/2022   Patient will demo 0.37ms walking speed with walker to improve gait speed and community ambulation Baseline: 0.50 m/s with walker; 0.563m with walker(06/01/22); 0.59 m/s (06/22/22)  Goal status: not met   2.  Patient will be able to perform sit to stand without HHA to improve functional strength Baseline: Requires 1-2 HHA from bed or chair to stand up (eval); able to do 2 reps without HHA but requires multiple attempt to stand up Goal status: Goal met.   3.  Patient will report 50% compliance with HEP to improve self management of symptoms Baseline:  HEP to be issued 2nd visit Goal status: goal met.     LONG TERM GOALS: Target date: 08/03/2022     Patient will demo gait speed of >0.73m71mwith LRAD to improve community ambulation and decrease fall risk Baseline: 0.5 m/s with walker; 0.55 m/s (06/01/22); 0.59 m/s (06/22/22) Goal status: progressing   2.  Pt will demo BBS of >50/56 to reduce fall risk Baseline: 42/56 (05/02/22); 50/56 (06/08/22) Goal status: goal met   3.  Patient will demo TUG <15 seconds with LRAD to improve functional mobility Baseline: 21 sec with RW; 26 sec with RW (06/01/22); 23 sec with st. Cane (06/02/22); 18.85 sec with st. Cane but requires CGA for safety (06/22/22) Goal status: Progressing    4. Patient will report 10 points of improvement on FOTO to improve overall function  Baseline: 54 (06/08/22) Goal status: progressing   ASSESSMENT:   CLINICAL IMPRESSION: Patient has been seen for total of 14 sessions from 02/06/22 to 06/22/22. Pt has met all of short term goals thus far and progressing towards his long term goals. Patient has demonstrated 0.09  m/s improvement in his gait speed with RW. Patient has demonstrated 4 sec improvement in his TUG score with LRAD but requires CGA for safety. Patient's attention and memory is limiting the speed of his progress in therapy. Patient will continue to benefit from skilled PT to improve his overall function. Pt demonstrated improved foot clearance with use of poasterior AFO and will benefit from getting one for his L LE.     OBJECTIVE IMPAIRMENTS Abnormal gait, decreased activity tolerance, decreased balance, decreased endurance, decreased mobility, difficulty walking, decreased ROM, decreased strength, impaired flexibility, improper body mechanics, and postural dysfunction.    ACTIVITY LIMITATIONS cleaning, community activity, and driving.    PERSONAL FACTORS Age, Past/current experiences, Time since onset of injury/illness/exacerbation, and 1-2 comorbidities: TN, HLD, DMII, CAD,  carotid artery disease s/p R CEA and history of multiple strokes  are also affecting patient's functional outcome.      REHAB POTENTIAL: Good   CLINICAL DECISION MAKING: Stable/uncomplicated   EVALUATION COMPLEXITY: Low   PLAN: PT FREQUENCY: 2x/week   PT DURATION: 6 weeks   PLANNED INTERVENTIONS: Therapeutic exercises, Therapeutic activity, Neuromuscular re-education, Balance training, Gait training, Patient/Family education, Joint mobilization, Stair training, Orthotic/Fit training, Cryotherapy, Moist heat, and Manual therapy   PLAN FOR NEXT SESSION: did we get new referral from MD for AFO, continue to work on step length, Continue to work on [employer], gait training, balance.  Cruzita Lederer Marra Fraga, PT, DPT 06/27/2022, 4:21 PM

## 2022-06-29 ENCOUNTER — Telehealth: Payer: Self-pay | Admitting: Primary Care

## 2022-06-29 ENCOUNTER — Ambulatory Visit: Payer: Medicare PPO | Admitting: Speech Pathology

## 2022-06-29 ENCOUNTER — Ambulatory Visit: Payer: Medicare PPO | Admitting: Physical Therapy

## 2022-06-29 VITALS — BP 178/84 | HR 62

## 2022-06-29 DIAGNOSIS — R2689 Other abnormalities of gait and mobility: Secondary | ICD-10-CM

## 2022-06-29 DIAGNOSIS — R1312 Dysphagia, oropharyngeal phase: Secondary | ICD-10-CM | POA: Diagnosis not present

## 2022-06-29 DIAGNOSIS — R2681 Unsteadiness on feet: Secondary | ICD-10-CM | POA: Diagnosis not present

## 2022-06-29 DIAGNOSIS — R1310 Dysphagia, unspecified: Secondary | ICD-10-CM

## 2022-06-29 DIAGNOSIS — M6281 Muscle weakness (generalized): Secondary | ICD-10-CM | POA: Diagnosis not present

## 2022-06-29 NOTE — Therapy (Signed)
OUTPATIENT PHYSICAL THERAPY TREATMENT NOTE   Patient Name: [name] [name] MRN: [id number] DOB:[birth year], 81 y.o., male Today's Date: 06/29/2022  PCP: Pleas Koch, NP, Josue Hector, MD REFERRING PROVIDER: Pleas Koch, NP    END OF SESSION:   PT End of Session - 06/29/22 1319     Visit Number 16    Number of Visits 26    Date for PT Re-Evaluation 08/03/22    Authorization Type [employer] - will need auth    Progress Note Due on Visit 24    PT Start Time 1316    PT Stop Time 1356    PT Time Calculation (min) 40 min    Activity Tolerance Patient tolerated treatment well;Treatment limited secondary to medical complications (Comment)   hypertension   Behavior During Therapy [facility] for tasks assessed/performed               Past Medical History:  Diagnosis Date   Basal cell carcinoma 11/09/2019   nod & infil-behind right ear-cx3 &exc   Basal cell carcinoma 03/21/2020   Residual BCC with peripheral margin involved - ST recommends [facility]   Carotid artery occlusion    Coronary artery disease    s/p CABG 2005; sees Dr Johnsie Cancel yearly   Diabetes mellitus without complication ([place])    Gynecomastia    Hypercholesterolemia    Hypertension    Neurodermatitis    Overweight(278.02)    Personal history of colonic polyps 10/08/2006   tubular adenomas   Renal insufficiency    Stroke [facility])    TIA  April 2022   Transient ischemic attack 2010   "lasted ~ 5 seconds"   Vitamin D deficiency    Past Surgical History:  Procedure Laterality Date   CARDIAC CATHETERIZATION  02/2004   "tried to stent; couldn't"   CAROTID ENDARTERECTOMY Right 08/26/2015   COLONOSCOPY     CORONARY ANGIOPLASTY     CORONARY ARTERY BYPASS GRAFT  Feb. 2005   4 vessel   ENDARTERECTOMY Right 08/26/2015   Procedure: Right Carotid ENDARTERECTOMY with Patch Angioplasty ;  Surgeon: Rosetta Posner, MD;  Location: [place];  Service: Vascular;  Laterality: Right;   ENDARTERECTOMY Left 05/18/2021    Procedure: LEFT CAROTID ARTERY ENDARTERECTOMY  with patch angioplasty;  Surgeon: Rosetta Posner, MD;  Location: [place];  Service: Vascular;  Laterality: Left;   EYE SURGERY     bilateral cataract   KELOID EXCISION  04/2008   on chest scar; Dr. Dessie Coma   KELOID EXCISION     PILONIDAL CYST EXCISION  1989   Patient Active Problem List   Diagnosis Date Noted   Depression 03/22/2022   Stroke-like symptoms 03/01/2022   History of CVA (cerebrovascular accident) 02/28/2022   Aspiration pneumonia vs. 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   Carotid artery disease ([place]) 07/01/2012   Special screening for malignant neoplasm of prostate 05/08/2011   Vitamin D deficiency 11/20/2009   Transient cerebral ischemia 06/30/2009   CEREBROVASCULAR DISEASE  06/30/2009   COLONIC POLYPS 01/10/2008   Essential hypertension 01/10/2008   Coronary atherosclerosis 01/10/2008   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   GYNECOMASTIA, UNILATERAL 01/10/2008   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   SHOULDER PAIN 01/10/2008   Type 2 diabetes mellitus ([place]) 01/10/2008    REFERRING DIAG:  P38.25 (ICD-10-CM) - History of CVA (cerebrovascular accident) I63.81 (ICD-10-CM) - 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 No new changes.   PAIN:  Are you having pain? No  Today's Vitals   06/29/22 1324 06/29/22 1339 06/29/22 1350  BP: (!) 165/84 (!) 178/61 (!) 178/84  Pulse: 65 62        OBJECTIVE: (objective measures completed at initial evaluation unless otherwise dated)    TODAY'S TREATMENT:   Ther Ex  SciFIt multi-peaks level 5 for 8 minutes using BUE/BLEs for dynamic cardiovascular conditioning, UE/LE coordination and endurance. Min cues for increased amplitude of movement, particularly bilateral knee extension.   Ther Act  Assessed pt's BP immediately before/after exercise and 5 minutes following exercise, as pt's BP has been elevated. Pt's systolic BP maintained >053 mmHg following exercise even with rest. BP assessed with machine and manually. Pt's pulse bounding, educated pt on importance of drinking water as pt does not drink enough during the day. Encouraged pt to go home and drink lots of water and reduce his intake of black tea, as caffeine can contribute to dehydration. Pt reported he had loose stools yesterday, likely contributing to his dehydration.   PATIENT EDUCATION: Education details: Wearing compression socks at home for edema management, see ther act section  Person educated: Patient and Spouse Education method: Explanation  Education comprehension: verbalized understanding and needs further education      HOME EXERCISE PROGRAM: Access Code: [email or web address] URL: [email or web address] Date: 05/11/2022 Prepared by: Mickie Bail Mazzie Brodrick  Exercises - Sit to Stand Without Arm Support  - 1 x daily - 7 x weekly - 3 sets - 5-7 reps - Heel Raise on Step  - 1 x daily - 7 x weekly - 3 sets - 10 reps - Forward Step Down with Heel Tap and Rail Support  - 1 x daily - 7 x weekly - 3 sets - 10 reps       GOALS: Goals reviewed  with patient? Yes   SHORT TERM GOALS: Target date: 05/30/2022   Patient will demo 0.71ms walking speed with walker to improve gait speed and community ambulation Baseline: 0.50 m/s with walker; 0.5530m with walker(06/01/22); 0.59 m/s (06/22/22)  Goal status: not met   2.  Patient will be able to perform sit to stand without HHA to improve functional strength Baseline: Requires 1-2 HHA from bed or chair to stand up (eval); able to do 2 reps without HHA but requires multiple attempt to stand up Goal status: Goal met.   3.  Patient will report 50% compliance with HEP to improve self management of symptoms Baseline: HEP to be issued 2nd visit Goal status: goal met.     LONG TERM GOALS: Target date: 08/03/2022     Patient will demo gait speed of >0.30m53mwith LRAD to improve community ambulation  and decrease fall risk Baseline: 0.5 m/s with walker; 0.55 m/s (06/01/22); 0.59 m/s (06/22/22) Goal status: progressing   2.  Pt will demo BBS of >50/56 to reduce fall risk Baseline: 42/56 (05/02/22); 50/56 (06/08/22) Goal status: goal met   3.  Patient will demo TUG <15 seconds with LRAD to improve functional mobility Baseline: 21 sec with RW; 26 sec with RW (06/01/22); 23 sec with [facility]. [place] (06/02/22); 18.85 sec with [facility]. C[place] but requires CGA for safety (06/22/22) Goal status: Progressing    4. Patient will report 10 points of improvement on FOTO to improve overall function Baseline: 54 (06/08/22) Goal status: progressing   ASSESSMENT:   CLINICAL IMPRESSION: Session limited due to pt's elevated BP. Pt's systolic BP maintained >696 mmHg following exercise and with rest. Pt reported he has not been drinking much water at all, mostly drinking sweet tea, and had loose stools day prior. Encouraged pt to drink lots of water this weekend and monitor BP closely. Continue POC      OBJECTIVE IMPAIRMENTS Abnormal gait, decreased activity tolerance, decreased balance, decreased endurance, decreased mobility, difficulty  walking, decreased ROM, decreased strength, impaired flexibility, improper body mechanics, and postural dysfunction.    ACTIVITY LIMITATIONS cleaning, community activity, and driving.    PERSONAL FACTORS Age, Past/current experiences, Time since onset of injury/illness/exacerbation, and 1-2 comorbidities: TN, HLD, DMII, CAD, carotid artery disease s/p R CEA and history of multiple strokes  are also affecting patient's functional outcome.      REHAB POTENTIAL: Good   CLINICAL DECISION MAKING: Stable/uncomplicated   EVALUATION COMPLEXITY: Low   PLAN: PT FREQUENCY: 2x/week   PT DURATION: 6 weeks   PLANNED INTERVENTIONS: Therapeutic exercises, Therapeutic activity, Neuromuscular re-education, Balance training, Gait training, Patient/Family education, Joint mobilization, Stair training, Orthotic/Fit training, Cryotherapy, Moist heat, and Manual therapy   PLAN FOR NEXT SESSION: did we get new referral from MD for AFO, continue to work on step length, Continue to work on [employer], gait training, balance.  Cruzita Lederer Stevon Gough, PT, DPT 06/29/2022, 1:59 PM

## 2022-06-29 NOTE — Telephone Encounter (Signed)
Most of his readings are overall okay.  I do not recommend he resume the hydrochlorothiazide blood pressure medication right now.  It looks like he is scheduled to see me in a few weeks, we can discuss further then.

## 2022-06-29 NOTE — Telephone Encounter (Signed)
Patients wife called and said BP has been fluctuating. This morning was 151/81, she said Wednesday 129/75 and 2 hours later140/93 2 hours later. She said his feet have been swollen as well. They werent sure if he should start back on the hydroclorthiazide since they still have some. They have been mostly concerned with the bottom number. Call back [phone]

## 2022-06-29 NOTE — Telephone Encounter (Signed)
Do they have additional readings from other days? Last week?

## 2022-06-29 NOTE — Telephone Encounter (Signed)
Patient's wife is calling in, here are the date/time and readings of blood pressure. States that after Wednesday's reading they wanted to check it daily and then after Thursday is when she felt like she needed to take it 3 times a day. hydrochlorothiazide (HYDRODIURIL) 12.5 MG tablet is a medication he was on before and wife wants to know if they can go back on it.   6/23 - 139/80 am (before meds) 168/71 6 pm (with meds)   6/24 - 135/69 am (before meds) - 2 hours later after taking med it was 142/77 -did note that he was more stressed this day    6/25 - 151/91 (before meds) - 2 hours later after medicine it was 175/109 (taken at 11:45 am) / 153/80 (taken at 8:45 pm)    6/29 - 135/75 (before meds at 8:30 am) - 2 hours later it was 139/73 - around 8 pm it was 161/79 pulse 57   6/30 - readings at [facility] 120/84 with a pulse 65  --- a retake after exercise 178/61 / (manual reading 175/73)

## 2022-06-29 NOTE — Patient Instructions (Addendum)
[place]ale cup  Safety straw      Hard Swallow (30) Tongue Out (15) EMST (15)  Fri PM        Sat AM        Sat PM        Sun AM        Sun PM        Mon AM        Mon PM        Tue AM        Tue PM        Wed AM

## 2022-06-29 NOTE — Therapy (Signed)
OUTPATIENT SPEECH LANGUAGE PATHOLOGY DYSPHAGIA TREATMENT NOTE    Patient Name: [name] [name] MRN: [id number] DOB:[birth year], 81 y.o., male Today's Date: 06/29/2022  PCP: Pleas Koch, NP  REFERRING PROVIDER: Pleas Koch, NP   END OF SESSION:   End of Session - 06/29/22 1405     Visit Number 13    Number of Visits 64    Date for SLP Re-Evaluation 07/12/22    Authorization Type [employer] Medicare    SLP Start Time 1400    SLP Stop Time  1430    SLP Time Calculation (min) 30 min    Activity Tolerance Patient tolerated treatment well                  Past Medical History:  Diagnosis Date   Basal cell carcinoma 11/09/2019   nod & infil-behind right ear-cx3 &exc   Basal cell carcinoma 03/21/2020   Residual BCC with peripheral margin involved - ST recommends [facility]   Carotid artery occlusion    Coronary artery disease    s/p CABG 2005; sees Dr Johnsie Cancel yearly   Diabetes mellitus without complication ([place])    Gynecomastia    Hypercholesterolemia    Hypertension    Neurodermatitis    Overweight(278.02)    Personal history of colonic polyps 10/08/2006   tubular adenomas   Renal insufficiency    Stroke [facility])    TIA  April 2022   Transient ischemic attack 2010   "lasted ~ 5 seconds"   Vitamin D deficiency    Past Surgical History:  Procedure Laterality Date   CARDIAC CATHETERIZATION  02/2004   "tried to stent; couldn't"   CAROTID ENDARTERECTOMY Right 08/26/2015   COLONOSCOPY     CORONARY ANGIOPLASTY     CORONARY ARTERY BYPASS GRAFT  Feb. 2005   4 vessel   ENDARTERECTOMY Right 08/26/2015   Procedure: Right Carotid ENDARTERECTOMY with Patch Angioplasty ;  Surgeon: Rosetta Posner, MD;  Location: [place];  Service: Vascular;  Laterality: Right;   ENDARTERECTOMY Left 05/18/2021   Procedure: LEFT CAROTID ARTERY ENDARTERECTOMY  with patch angioplasty;  Surgeon: Rosetta Posner, MD;  Location: [place];  Service: Vascular;  Laterality: Left;   EYE SURGERY     bilateral  cataract   KELOID EXCISION  04/2008   on chest scar; Dr. Dessie Coma   KELOID EXCISION     PILONIDAL CYST EXCISION  1989   Patient Active Problem List   Diagnosis Date Noted   Depression 03/22/2022   Stroke-like symptoms 03/01/2022   History of CVA (cerebrovascular accident) 02/28/2022   Aspiration pneumonia vs. 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   TIA (transient ischemic attack) 04/03/2021   Lower extremity edema 08/13/2019   Unsteady gait 06/09/2019   Trigger ring finger of right hand 06/02/2018   Ceruminosis, bilateral 09/16/2017   Hyperlipidemia 01/30/2016   Carotid stenosis 08/26/2015   Anxiety, 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   Carotid artery disease ([place]) 07/01/2012   Special screening for malignant neoplasm of prostate 05/08/2011   Vitamin D deficiency 11/20/2009   Transient cerebral ischemia 06/30/2009   CEREBROVASCULAR DISEASE 06/30/2009   COLONIC POLYPS 01/10/2008   Essential hypertension 01/10/2008   Coronary atherosclerosis 01/10/2008   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   GYNECOMASTIA, UNILATERAL 01/10/2008  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   SHOULDER PAIN 01/10/2008   Type 2 diabetes mellitus ([place]) 01/10/2008    ONSET DATE: 02/28/2022    REFERRING DIAG: P71.06 (ICD-10-CM) - History of CVA (cerebrovascular accident) R13.10 (ICD-10-CM) - Dysphagia, unspecified type J69.0 (ICD-10-CM) - Aspiration pneumonia, unspecified aspiration pneumonia type, unspecified laterality, unspecified part of lung ([place])   THERAPY DIAG: Dysphagia, unspecified type  SUBJECTIVE:   SUBJECTIVE STATEMENT: "I did a set this morning"   PAIN:  Are you having pain? No  OBJECTIVE:   RECOMMENDATIONS FROM OBJECTIVE SWALLOW STUDY (MBSS/FEES):   regular diet Objective swallow impairments: reduced posterior bolus propulsion, reduced bolus cohesion, and a pharyngeal delay. Penetration (PAS 3, 5) was noted with thin and nectar thick liquids via cup and straw and aspiration (PAS 7, 8) was observed with consecutive swallows of thin liquids, with thin liquids via cup when the pill was taken and with consecutive swallows of nectar thick liquids. Instances of aspiration inconsistently triggered delayed coughing and this was noted most frequently with larger amounts of aspiration. Coughing was ineffective in expelling the aspirate, but cued coughing did expel penetrated material. With thin and nectar thick liquids, penetration and aspiration were eliminated with a chin tuck posture with left head turn and use of individual swallows. Laryngeal invasion was not observed with individual swallows of thin liquids when pt was cued to swallow on the count of three. Laryngeal invasion was also improved to a functional level of (PAS 2) with smaller boluses of thin liquids. No instances of penetration/aspiration were noted with honey thick liquids. Objective recommended compensations: cup, no straw; meds with puree, small bite/sips, slow rate  TODAY'S TREATMENT:   DYSPHAGIA TREATMENT:   Current diet: regular and honey thick liquids Swallow precautions: small bites, small sips, alternate bites and sips, chin tuck, head turn, stay upright 30 minutes post meal, hard swallow, no straws, and avoid mixed consistencies Patient directly observed with POs: Yes: thin liquids and honey thick liquids   Feeding: able to feed self Liquids provided by: cup and straw  Oral phase signs and symptoms:  n/a Pharyngeal phase signs and symptoms: delayed throat clear, immediate cough, and watery eyes  Therapeutic exercises: Effortful Swallow Types of cueing: verbal Amount of cueing: modified independent for PO trials Treatment comments: SLP facilitated thin liquid trials via cup  sips, following pt teaching back swallow compensations and strategies. Pt able to teach back small sip, hold + swallow IND. Min-A for teach back of chin tuck and head turn with larger bolus. Pt with throat clear x3 out of total of 12 trials. Demonstrates hold and swallow 10/12 trials with use of visual aid. During one episode of coughing, pt ID distraction as limiting factor from using strategies. SLP provides extensive education regarding how distractions may limit pt's safe consumption of thin liquids and how that may inform plan following instrumental swallow study results. Pt and wife verbalize understanding.   HOME EXERCISE PROGRAM: Exercises: Effortful Swallow 20-30 repetitions, 3 x/day, [place] 10 repetitions, 3 x/day, and EMST 10 repetitions, 2 x/day  PATIENT EDUCATION: Education details: see above Person educated: Patient and Spouse Education method: Explanation, Demonstration, Verbal cues, and Handouts Education comprehension: verbalized understanding, returned demonstration, and needs further education     ASSESSMENT:   CLINICAL IMPRESSION: Patient is a 81 y.o. M who was seen today for dysphagia and dysarthria. Instrumental swallow study demonstrates penetration and aspiration with thin and nectar thick liquids. Impairments in swallow include reduced posterior bolus propulsion, reduced bolus cohesion,  and a pharyngeal delay. Chin tuck and head turn to left reduce laryngeal penetration, however pt with difficulty recalling to follow this protocol.  Wife, [name], is thickening all liquids for pt. Ongoing education and training of dysphagia exercises completed, with inconsistent compliance and subjective reported improvement in swallow function at home. Recommend f/u MBSS at this time d/t decreased overt s/sx of aspiration with thin liquid trials and no adverse respiratory presentations despite having thin liquids at home, with likely noncompliance of chin-tuck, head turn compensation. Scheduled  for f/u MBSS next week.   OBJECTIVE IMPAIRMENTS include dysarthria and dysphagia. These impairments are limiting patient from safety when swallowing and effectively communicating at home and in community. Factors affecting potential to achieve goals and functional outcome are ability to learn/carryover information and previous level of function. Patient will benefit from skilled SLP services to address above impairments and improve overall function.   REHAB POTENTIAL: Fair       GOALS: Goals reviewed with patient? Yes   SHORT TERM GOALS: Target date: 06/14/2022     Pt and wife will report compliance with HEP frequency and repetitions with mod-I over 2 sessions. Baseline: 06-08-22, 06-13-22 Goal status: partially met   2.  Pt and wife will report implementation of visual aids at home to support pt recall of targeted swallowing compensations and strategies over 2 sessions.  Baseline: 06-13-22 Goal status: not met   3.  Pt will teach back prescribed dysphagia exercises with rare min-A over 2 sessions Baseline: 06-08-22, 06-13-22 Goal status: met     LONG TERM GOALS: Target date: 07/12/2022   Pt and spouse will report ongoing compliance with prescribed HEP with mod-I over 1 week period Baseline:  Goal status: ongoing   2.  Using visual aids, pt with comply with targeted swallow precautions and compensations with no more than 2 verbal cues over 10 minute period.  Baseline:  Goal status: ongoing   3.  Pt will improve score on EAT-10 PROM by d/c, indicating subjective improvement in swallow function Baseline:  Goal status: ongoing   4.  Pt will participate in follow up MBSS following therapy course to objectively assess improvement in swallow function and safety.  Baseline:  Goal status: ongoing     PLAN: SLP FREQUENCY: 2x/week   SLP DURATION: 8 weeks   PLANNED INTERVENTIONS: Aspiration precaution training, Pharyngeal strengthening exercises, Diet toleration management , Trials of  upgraded texture/liquids, SLP instruction and feedback, Compensatory strategies, Patient/family education, and Re-evaluation   Su Monks, [place] 06/29/2022, 2:10 PM

## 2022-07-02 ENCOUNTER — Ambulatory Visit (HOSPITAL_COMMUNITY)
Admission: RE | Admit: 2022-07-02 | Discharge: 2022-07-02 | Disposition: A | Discharge status: Home / Self Care | Payer: Medicare PPO | Source: Ambulatory Visit | Attending: Primary Care | Admitting: Primary Care

## 2022-07-02 DIAGNOSIS — R1312 Dysphagia, oropharyngeal phase: Secondary | ICD-10-CM | POA: Diagnosis not present

## 2022-07-02 DIAGNOSIS — R1310 Dysphagia, unspecified: Secondary | ICD-10-CM

## 2022-07-02 NOTE — Telephone Encounter (Signed)
Left message to return call to our office.  

## 2022-07-02 NOTE — Progress Notes (Signed)
Modified Barium Swallow Progress Note  Patient Details  Name: [name] [name] MRN: [id number] Date of Birth: 03-Apr-1941  Today's Date: 07/02/2022  Modified Barium Swallow completed.  Full report located under Chart Review in the Imaging Section.  Brief recommendations include the following:  Clinical Impression  Pt presents with persistent dysphagia, but with mild improvements noted since previous MBS in March 2023. [place] deficits still include reduced posterior propulsion with spill into the pharynx as well as delayed swallow initiation with invasion into the airway when boluses reach the pyriform sinuses before the swallow is initiated. This happened with thin liquids when he would elevate his head in between sips. His airway protection still remains optimal with thin liquids when using a chin tuck and head turn L on single sips, which pt can perform during this study but reportedly is having trouble with implementation/carryover per wife. Note that he kept his chin in a tucked position best when using a straw so that he could maintain that position across trials. He still had silent aspiration of nectar thick liquids but it does not appear to occur with the same frequency as it did on previous MBS. Airway protection is variable with nectar thick liquids though, ranging from no airway invasion with sips that are well contained within the valleculae, to frank penetration to the vocal folds, to silent aspiration, and a cued cough does not clear penetrates or aspirates. Honey thick liquids and solids are still best contained above the valleculae and therefore consumed without any aspiration. Would continue follow up with OP SLP, ultimately deferring diet recommendations and tx plan to his primary SLP; however, would consider trials of water protocol with thin water with use of straw for chin tuck posture. May want to continue regular solids and honey thick liquids for meals with ongoing dysphagia  exercises, perhaps including EMST.   Swallow Evaluation Recommendations       SLP Diet Recommendations: Regular solids;Honey thick liquids;Other (Comment) (consideration of water protocol with use of chin tuck and straw)   Liquid Administration via: Cup;Straw   Medication Administration: Whole meds with puree   Supervision: Patient able to self feed;Intermittent supervision to cue for compensatory strategies   Compensations: Slow rate;Small sips/bites   Postural Changes: Seated upright at 90 degrees   Oral Care Recommendations: Oral care BID        Osie Bond., M.A. [place] Office [phone]  Secure chat preferred  07/02/2022,5:02 PM

## 2022-07-04 ENCOUNTER — Ambulatory Visit: Payer: Medicare PPO | Attending: Primary Care | Admitting: Physical Therapy

## 2022-07-04 VITALS — BP 114/67 | HR 68

## 2022-07-04 DIAGNOSIS — R1310 Dysphagia, unspecified: Secondary | ICD-10-CM | POA: Diagnosis not present

## 2022-07-04 DIAGNOSIS — M6281 Muscle weakness (generalized): Secondary | ICD-10-CM | POA: Insufficient documentation

## 2022-07-04 DIAGNOSIS — R2681 Unsteadiness on feet: Secondary | ICD-10-CM | POA: Insufficient documentation

## 2022-07-04 DIAGNOSIS — R1312 Dysphagia, oropharyngeal phase: Secondary | ICD-10-CM | POA: Diagnosis not present

## 2022-07-04 DIAGNOSIS — R2689 Other abnormalities of gait and mobility: Secondary | ICD-10-CM | POA: Diagnosis not present

## 2022-07-04 NOTE — Therapy (Signed)
OUTPATIENT PHYSICAL THERAPY TREATMENT NOTE   Patient Name: [name] [name] MRN: [id number] DOB:27-Sep-1941, 81 y.o., male Today's Date: 07/04/2022  PCP: Pleas Koch, NP, Josue Hector, MD REFERRING PROVIDER: Pleas Koch, NP    END OF SESSION:   PT End of Session - 07/04/22 1451     Visit Number 17    Number of Visits 26    Date for PT Re-Evaluation 08/03/22    Authorization Type [employer] - will need auth    Progress Note Due on Visit 24    PT Start Time 1449    PT Stop Time 7408    PT Time Calculation (min) 41 min    Activity Tolerance Patient tolerated treatment well    Behavior During Therapy [facility] for tasks assessed/performed               Past Medical History:  Diagnosis Date   Basal cell carcinoma 11/09/2019   nod & infil-behind right ear-cx3 &exc   Basal cell carcinoma 03/21/2020   Residual BCC with peripheral margin involved - ST recommends [facility]   Carotid artery occlusion    Coronary artery disease    s/p CABG 2005; sees Dr Johnsie Cancel yearly   Diabetes mellitus without complication ([place])    Gynecomastia    Hypercholesterolemia    Hypertension    Neurodermatitis    Overweight(278.02)    Personal history of colonic polyps 10/08/2006   tubular adenomas   Renal insufficiency    Stroke [facility])    TIA  April 2022   Transient ischemic attack 2010   "lasted ~ 5 seconds"   Vitamin D deficiency    Past Surgical History:  Procedure Laterality Date   CARDIAC CATHETERIZATION  02/2004   "tried to stent; couldn't"   CAROTID ENDARTERECTOMY Right 08/26/2015   COLONOSCOPY     CORONARY ANGIOPLASTY     CORONARY ARTERY BYPASS GRAFT  Feb. 2005   4 vessel   ENDARTERECTOMY Right 08/26/2015   Procedure: Right Carotid ENDARTERECTOMY with Patch Angioplasty ;  Surgeon: Rosetta Posner, MD;  Location: [place];  Service: Vascular;  Laterality: Right;   ENDARTERECTOMY Left 05/18/2021   Procedure: LEFT CAROTID ARTERY ENDARTERECTOMY  with patch angioplasty;  Surgeon:  Rosetta Posner, MD;  Location: [place];  Service: Vascular;  Laterality: Left;   EYE SURGERY     bilateral cataract   KELOID EXCISION  04/2008   on chest scar; Dr. Dessie Coma   KELOID EXCISION     PILONIDAL CYST EXCISION  1989   Patient Active Problem List   Diagnosis Date Noted   Depression 03/22/2022   Stroke-like symptoms 03/01/2022   History of CVA (cerebrovascular accident) 02/28/2022   Aspiration pneumonia vs. 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   Carotid artery disease ([place]) 07/01/2012   Special screening for malignant neoplasm of prostate 05/08/2011   Vitamin D deficiency 11/20/2009   Transient cerebral ischemia 06/30/2009   CEREBROVASCULAR DISEASE 06/30/2009   COLONIC POLYPS 01/10/2008  Essential hypertension 01/10/2008   Coronary atherosclerosis 01/10/2008   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   GYNECOMASTIA, UNILATERAL 01/10/2008   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   SHOULDER PAIN 01/10/2008   Type 2 diabetes mellitus ([place]) 01/10/2008    REFERRING DIAG:  J69.67 (ICD-10-CM) - History of CVA (cerebrovascular accident) I63.81 (ICD-10-CM) - 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 Pt reports he is doing well, no new changes. Cannot remember if he exercised the past few days.   PAIN:  Are you having pain? No  Today's Vitals   07/04/22 1502  BP: 114/67  Pulse: 68      OBJECTIVE: (objective measures completed at initial evaluation unless otherwise dated)    TODAY'S TREATMENT:   Ther Act  Discussed Dr. Ainsley Spinner response in [place] regarding not recommending pt start taken hydrochlorothiazide again. Pt's wife stated she would continue to give him meds until their appointment with Dr. Carlis Abbott next week as she feels it is helping him. Therapist informed pt and wife that it is outside of PT's scope of practice to give recommendations on medications, but reiterated that Dr. Carlis Abbott is advising against it.   NMR  In // bars for improved step length, single leg stability and BLE coordination: -Alt toe taps tp 6" step without UE support, x12 per side. Min cues to pick R foot off step to lower rather than slide it off for increased hip flexor activation.  -Alt fwd lunge over 2" foam beam to colored dot for visual target, x15 per side without UE support. Min cues to take single, large step fwd and single, large step back. Noted significant difficulty clearing RLE from beam and pt frequently required multiple small steps to bring foot back to starting position.  -On rockerboard in A/P direction, pt practiced maintaining balance without UE support using mirror for visual biofeedback. Pt able to maintain balance for 4 minutes, noted anterior lean bias requiring min tactile cues for posterior weight shift.  -Progressed to alt cone taps from rockerboard, x10 per side without UE support. Pt w/notable difficulty tapping w/LLE due to poor stance control and balance on RLE. Min guard throughout for safety.   Gait pattern: step through pattern, decreased stride length,  decreased ankle dorsiflexion- Right, scissoring, ataxic, trunk flexed, and poor foot clearance- Right Distance walked: Various clinic distances  Assistive device utilized: Walker - 2 wheeled Level of assistance: SBA Comments: Noted increased scissoring of RLE today but pt able to increase step length following activity in // bars    PATIENT EDUCATION: Education details: See ther act   Person educated: Patient and Spouse Education method: Explanation  Education comprehension: verbalized understanding and needs further education      HOME EXERCISE PROGRAM: Access Code: [email or web address] URL: [email or web address] Date: 05/11/2022 Prepared by: Mickie Bail Kasen Adduci  Exercises - Sit to Stand Without Arm Support  - 1 x daily - 7 x weekly - 3 sets - 5-7 reps - Heel Raise on Step  - 1 x daily - 7 x weekly - 3 sets - 10 reps - Forward Step Down with Heel Tap and Rail Support  - 1  x daily - 7 x weekly - 3 sets - 10 reps       GOALS: Goals reviewed with patient? Yes   SHORT TERM GOALS: Target date: 05/30/2022   Patient will demo 0.425ms walking speed with walker to improve gait speed and community ambulation Baseline: 0.50 m/s with walker; 0.542m with walker(06/01/22); 0.59 m/s (06/22/22)  Goal status: not met   2.  Patient will be able to perform sit to stand without HHA to improve functional strength Baseline: Requires 1-2 HHA from bed or chair to stand up (eval); able to do 2 reps without HHA but requires multiple attempt to stand up Goal status: Goal met.   3.  Patient will report 50% compliance with HEP to improve self management of symptoms Baseline: HEP to be issued 2nd visit Goal status: goal met.     LONG TERM GOALS: Target date: 08/03/2022     Patient will demo gait speed of >0.25m44mwith LRAD to improve community ambulation and decrease fall risk Baseline: 0.5 m/s with walker; 0.55 m/s (06/01/22); 0.59 m/s (06/22/22) Goal status: progressing   2.  Pt will demo BBS of >50/56  to reduce fall risk Baseline: 42/56 (05/02/22); 50/56 (06/08/22) Goal status: goal met   3.  Patient will demo TUG <15 seconds with LRAD to improve functional mobility Baseline: 21 sec with RW; 26 sec with RW (06/01/22); 23 sec with [facility]. C[place] (06/02/22); 18.85 sec with st. Cane but requires CGA for safety (06/22/22) Goal status: Progressing    4. Patient will report 10 points of improvement on FOTO to improve overall function Baseline: 54 (06/08/22) Goal status: progressing   ASSESSMENT:   CLINICAL IMPRESSION: Emphasis of skilled PT session on single leg stability, BLE coordination and increased step length. Pt requires min multimodal cues for proper sequencing and performance of interventions but improves quality of movement w/practice. Pt's BP lower today, pt's wife reporting she started giving him hydrochlorothiazide again despite Dr. ClaAinsley Spinnervice not to (pt did not see message in MyChart or receive voicemail from office). Continue POC.      OBJECTIVE IMPAIRMENTS Abnormal gait, decreased activity tolerance, decreased balance, decreased endurance, decreased mobility, difficulty walking, decreased ROM, decreased strength, impaired flexibility, improper body mechanics, and postural dysfunction.    ACTIVITY LIMITATIONS cleaning, community activity, and driving.    PERSONAL FACTORS Age, Past/current experiences, Time since onset of injury/illness/exacerbation, and 1-2 comorbidities: TN, HLD, DMII, CAD, carotid artery disease s/p R CEA and history of multiple strokes  are also affecting patient's functional outcome.      REHAB POTENTIAL: Good   CLINICAL DECISION MAKING: Stable/uncomplicated   EVALUATION COMPLEXITY: Low   PLAN: PT FREQUENCY: 2x/week   PT DURATION: 6 weeks   PLANNED INTERVENTIONS: Therapeutic exercises, Therapeutic activity, Neuromuscular re-education, Balance training, Gait training, Patient/Family education, Joint mobilization, Stair training, Orthotic/Fit training,  Cryotherapy, Moist heat, and Manual therapy   PLAN FOR NEXT SESSION: did we get new referral from MD for AFO, continue to work on step length, Continue to work on BBSOffice Depotait training, balance.  JanCruzita Ledereraster, PT, DPT 07/04/2022, 3:33 PM

## 2022-07-05 ENCOUNTER — Encounter: Payer: Medicare PPO | Admitting: Primary Care

## 2022-07-05 NOTE — Telephone Encounter (Signed)
Patient wife called back states that she started him back on HCTZ on 06/30/22. She has noticed that he has had better bp readings after starting. She has noticed a little improvement in swelling. Wanted to know if ok to keep taking.   [phone]

## 2022-07-05 NOTE — Telephone Encounter (Signed)
Please tell her to keep track of his blood pressure readings and bring those in next week during his appointment.  He can continue the hydrochlorothiazide medication for now as long as he is not feeling dizzy and his blood pressure is not dropping below 100/60.

## 2022-07-05 NOTE — Telephone Encounter (Signed)
Called wife reviewed all information. Will call office to let us know if any questions. Will bring log to visit. She denies him having any dizziness at this time. Did say that this morning that his blood pressure was 104/54 she was hesitant to stop because all of his other readings have been in 120/60 range. Advised if any other readings blow recommendation that she should stop medication. Agreed that if any further readings lower than 100/60 she will d/c

## 2022-07-06 ENCOUNTER — Ambulatory Visit: Payer: Medicare PPO | Admitting: Physical Therapy

## 2022-07-06 ENCOUNTER — Ambulatory Visit: Payer: Medicare PPO | Admitting: Speech Pathology

## 2022-07-06 VITALS — BP 127/60 | HR 72

## 2022-07-06 DIAGNOSIS — M6281 Muscle weakness (generalized): Secondary | ICD-10-CM

## 2022-07-06 DIAGNOSIS — R2681 Unsteadiness on feet: Secondary | ICD-10-CM | POA: Diagnosis not present

## 2022-07-06 DIAGNOSIS — R1312 Dysphagia, oropharyngeal phase: Secondary | ICD-10-CM | POA: Diagnosis not present

## 2022-07-06 DIAGNOSIS — R2689 Other abnormalities of gait and mobility: Secondary | ICD-10-CM

## 2022-07-06 DIAGNOSIS — R1310 Dysphagia, unspecified: Secondary | ICD-10-CM | POA: Diagnosis not present

## 2022-07-06 NOTE — Therapy (Signed)
OUTPATIENT SPEECH LANGUAGE PATHOLOGY DYSPHAGIA TREATMENT NOTE    Patient Name: [name] [name] MRN: [id number] DOB:November 16, 1941, 81 y.o., male Today's Date: 07/06/2022  PCP: Pleas Koch, NP  REFERRING PROVIDER: Pleas Koch, NP   END OF SESSION:   End of Session - 07/06/22 1446     Visit Number 14    Number of Visits 17    Date for SLP Re-Evaluation 07/12/22    Authorization Type [employer] Medicare    SLP Start Time 1447    SLP Stop Time  4196    SLP Time Calculation (min) 43 min    Activity Tolerance Patient tolerated treatment well                  Past Medical History:  Diagnosis Date   Basal cell carcinoma 11/09/2019   nod & infil-behind right ear-cx3 &exc   Basal cell carcinoma 03/21/2020   Residual BCC with peripheral margin involved - ST recommends [facility]   Carotid artery occlusion    Coronary artery disease    s/p CABG 2005; sees Dr Johnsie Cancel yearly   Diabetes mellitus without complication ([place])    Gynecomastia    Hypercholesterolemia    Hypertension    Neurodermatitis    Overweight(278.02)    Personal history of colonic polyps 10/08/2006   tubular adenomas   Renal insufficiency    Stroke [facility])    TIA  April 2022   Transient ischemic attack 2010   "lasted ~ 5 seconds"   Vitamin D deficiency    Past Surgical History:  Procedure Laterality Date   CARDIAC CATHETERIZATION  02/2004   "tried to stent; couldn't"   CAROTID ENDARTERECTOMY Right 08/26/2015   COLONOSCOPY     CORONARY ANGIOPLASTY     CORONARY ARTERY BYPASS GRAFT  Feb. 2005   4 vessel   ENDARTERECTOMY Right 08/26/2015   Procedure: Right Carotid ENDARTERECTOMY with Patch Angioplasty ;  Surgeon: Rosetta Posner, MD;  Location: [place];  Service: Vascular;  Laterality: Right;   ENDARTERECTOMY Left 05/18/2021   Procedure: LEFT CAROTID ARTERY ENDARTERECTOMY  with patch angioplasty;  Surgeon: Rosetta Posner, MD;  Location: [place];  Service: Vascular;  Laterality: Left;   EYE SURGERY     bilateral  cataract   KELOID EXCISION  04/2008   on chest scar; Dr. Dessie Coma   KELOID EXCISION     PILONIDAL CYST EXCISION  1989   Patient Active Problem List   Diagnosis Date Noted   Depression 03/22/2022   Stroke-like symptoms 03/01/2022   History of CVA (cerebrovascular accident) 02/28/2022   Aspiration pneumonia vs. 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   TIA (transient ischemic attack) 04/03/2021   Lower extremity edema 08/13/2019   Unsteady gait 06/09/2019   Trigger ring finger of right hand 06/02/2018   Ceruminosis, bilateral 09/16/2017   Hyperlipidemia 01/30/2016   Carotid stenosis 08/26/2015   Anxiety, 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   Carotid artery disease ([place]) 07/01/2012   Special screening for malignant neoplasm of prostate 05/08/2011   Vitamin D deficiency 11/20/2009   Transient cerebral ischemia 06/30/2009   CEREBROVASCULAR DISEASE 06/30/2009   COLONIC POLYPS 01/10/2008   Essential hypertension 01/10/2008   Coronary atherosclerosis 01/10/2008   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   GYNECOMASTIA, UNILATERAL 01/10/2008  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   SHOULDER PAIN 01/10/2008   Type 2 diabetes mellitus ([place]) 01/10/2008    ONSET DATE: 02/28/2022    REFERRING DIAG: F81.01 (ICD-10-CM) - History of CVA (cerebrovascular accident) R13.10 (ICD-10-CM) - Dysphagia, unspecified type J69.0 (ICD-10-CM) - Aspiration pneumonia, unspecified aspiration pneumonia type, unspecified laterality, unspecified part of lung ([place])   THERAPY DIAG: Dysphagia, oropharyngeal phase  SUBJECTIVE:   SUBJECTIVE STATEMENT: Pt arrives post-PT, has questions regarding MBSS completed on Monday   PAIN:  Are you having pain? No  OBJECTIVE:   RECOMMENDATIONS  FROM OBJECTIVE SWALLOW STUDY (MBSS/FEES):  regular diet, honey tick liquids Objective swallow impairments: Pt presents with persistent dysphagia, but with mild improvements noted since previous MBS in March 2023. [place] deficits still include reduced posterior propulsion with spill into the pharynx as well as delayed swallow initiation with invasion into the airway when boluses reach the pyriform sinuses before the swallow is initiated. This happened with thin liquids when he would elevate his head in between sips. His airway protection still remains optimal with thin liquids when using a chin tuck and head turn L on single sips, which pt can perform during this study but reportedly is having trouble with implementation/carryover per wife. Note that he kept his chin in a tucked position best when using a straw so that he could maintain that position across trials. He still had silent aspiration of nectar thick liquids but it does not appear to occur with the same frequency as it did on previous MBS. Airway protection is variable with nectar thick liquids though, ranging from no airway invasion with sips that are well contained within the valleculae, to frank penetration to the vocal folds, to silent aspiration, and a cued cough does not clear penetrates or aspirates. Honey thick liquids and solids are still best contained above the valleculae and therefore consumed without any aspiration. Objective recommended compensations: cup, no straw; meds with puree, small bite/sips, slow rate  TODAY'S TREATMENT:   DYSPHAGIA TREATMENT:   Current diet: regular and honey thick liquids Swallow precautions: small bites, small sips, alternate bites and sips, chin tuck, head turn, stay upright 30 minutes post meal, hard swallow, no straws, and avoid mixed consistencies Patient directly observed with POs: No  Feeding:  n/a Liquids provided by:  n.a   Oral phase signs and symptoms:  n/a Pharyngeal phase signs and  symptoms:  n/a   Therapeutic exercises: Effortful Swallow Types of cueing: verbal Amount of cueing: modified independent  Treatment comments: SLP provides extensive education on MBSS results and provides recommendations for diet modifications patient and family may make in balancing QoL and swallow safety. SLP recommends honey thickened liquids for maximal safety. However, should pt desire thin liquids, SLP recommends strict adherence to safety precautions such as: small sip (use of provale cup), limit distractions, keep to certain time of day, use of chin tuck/head turn. Also discussed free water protocol and optimal oral care for aspiration safety. Plan to support family in making diet modification decisions next session, re-assess MEP, and reduce frequency to 1x per week.   HOME EXERCISE PROGRAM: Exercises: Effortful Swallow 20-30 repetitions, 3 x/day, [place] 10 repetitions, 3 x/day, and EMST 10 repetitions, 2 x/day  PATIENT EDUCATION: Education details: see above Person educated: Patient and Spouse Education method: Explanation, Demonstration, Verbal cues, and Handouts Education comprehension: verbalized understanding, returned demonstration, and needs further education     ASSESSMENT:   CLINICAL IMPRESSION: Patient is a 81 y.o. M who was seen  today for dysphagia and dysarthria. Instrumental swallow study demonstrates penetration and aspiration with thin and nectar thick liquids. Impairments in swallow include reduced posterior bolus propulsion, reduced bolus cohesion, and a pharyngeal delay. Chin tuck and head turn to left reduce laryngeal penetration, however pt with difficulty recalling to follow this protocol.  Wife, [name], is thickening all liquids for pt. Ongoing education and training of dysphagia exercises completed, with inconsistent compliance and subjective reported improvement in swallow function at home. Recommend f/u MBSS at this time d/t decreased overt s/sx of aspiration with  thin liquid trials and no adverse respiratory presentations despite having thin liquids at home, with likely noncompliance of chin-tuck, head turn compensation. Scheduled for f/u MBSS next week.   OBJECTIVE IMPAIRMENTS include dysarthria and dysphagia. These impairments are limiting patient from safety when swallowing and effectively communicating at home and in community. Factors affecting potential to achieve goals and functional outcome are ability to learn/carryover information and previous level of function. Patient will benefit from skilled SLP services to address above impairments and improve overall function.   REHAB POTENTIAL: Fair       GOALS: Goals reviewed with patient? Yes   SHORT TERM GOALS: Target date: 06/14/2022     Pt and wife will report compliance with HEP frequency and repetitions with mod-I over 2 sessions. Baseline: 06-08-22, 06-13-22 Goal status: partially met   2.  Pt and wife will report implementation of visual aids at home to support pt recall of targeted swallowing compensations and strategies over 2 sessions.  Baseline: 06-13-22 Goal status: not met   3.  Pt will teach back prescribed dysphagia exercises with rare min-A over 2 sessions Baseline: 06-08-22, 06-13-22 Goal status: met     LONG TERM GOALS: Target date: 07/12/2022   Pt and spouse will report ongoing compliance with prescribed HEP with mod-I over 1 week period Baseline: 07/06/22 Goal status: ongoing   2.  Using visual aids, pt with comply with targeted swallow precautions and compensations with no more than 2 verbal cues over 10 minute period.  Baseline:  Goal status: ongoing   3.  Pt will improve score on EAT-10 PROM by d/c, indicating subjective improvement in swallow function Baseline:  Goal status: ongoing   4.  Pt will participate in follow up MBSS following therapy course to objectively assess improvement in swallow function and safety.  Baseline: 07/06/22 Goal status: Met     PLAN: SLP  FREQUENCY: 2x/week   SLP DURATION: 8 weeks   PLANNED INTERVENTIONS: Aspiration precaution training, Pharyngeal strengthening exercises, Diet toleration management , Trials of upgraded texture/liquids, SLP instruction and feedback, Compensatory strategies, Patient/family education, and Re-evaluation   Su Monks, [place] 07/06/2022, 3:22 PM

## 2022-07-06 NOTE — Therapy (Signed)
OUTPATIENT PHYSICAL THERAPY TREATMENT NOTE   Patient Name: [name] [name] MRN: [id number] DOB:Nov 24, 1941, 81 y.o., male Today's Date: 07/06/2022  PCP: Pleas Koch, NP, Josue Hector, MD REFERRING PROVIDER: Pleas Koch, NP    END OF SESSION:   PT End of Session - 07/06/22 1402     Visit Number 18    Number of Visits 26    Date for PT Re-Evaluation 08/03/22    Authorization Type [employer] - will need auth    Progress Note Due on Visit 24    PT Start Time 1400    PT Stop Time 1446    PT Time Calculation (min) 46 min    Activity Tolerance Patient tolerated treatment well    Behavior During Therapy [facility] for tasks assessed/performed;Impulsive                Past Medical History:  Diagnosis Date   Basal cell carcinoma 11/09/2019   nod & infil-behind right ear-cx3 &exc   Basal cell carcinoma 03/21/2020   Residual BCC with peripheral margin involved - ST recommends MOHs   Carotid artery occlusion    Coronary artery disease    s/p CABG 2005; sees Dr Johnsie Cancel yearly   Diabetes mellitus without complication ([place])    Gynecomastia    Hypercholesterolemia    Hypertension    Neurodermatitis    Overweight(278.02)    Personal history of colonic polyps 10/08/2006   tubular adenomas   Renal insufficiency    Stroke [facility])    TIA  April 2022   Transient ischemic attack 2010   "lasted ~ 5 seconds"   Vitamin D deficiency    Past Surgical History:  Procedure Laterality Date   CARDIAC CATHETERIZATION  02/2004   "tried to stent; couldn't"   CAROTID ENDARTERECTOMY Right 08/26/2015   COLONOSCOPY     CORONARY ANGIOPLASTY     CORONARY ARTERY BYPASS GRAFT  Feb. 2005   4 vessel   ENDARTERECTOMY Right 08/26/2015   Procedure: Right Carotid ENDARTERECTOMY with Patch Angioplasty ;  Surgeon: Rosetta Posner, MD;  Location: [place];  Service: Vascular;  Laterality: Right;   ENDARTERECTOMY Left 05/18/2021   Procedure: LEFT CAROTID ARTERY ENDARTERECTOMY  with patch angioplasty;   Surgeon: Rosetta Posner, MD;  Location: [place];  Service: Vascular;  Laterality: Left;   EYE SURGERY     bilateral cataract   KELOID EXCISION  04/2008   on chest scar; Dr. Dessie Coma   KELOID EXCISION     PILONIDAL CYST EXCISION  1989   Patient Active Problem List   Diagnosis Date Noted   Depression 03/22/2022   Stroke-like symptoms 03/01/2022   History of CVA (cerebrovascular accident) 02/28/2022   Aspiration pneumonia vs. 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   Carotid artery disease ([place]) 07/01/2012   Special screening for malignant neoplasm of prostate 05/08/2011   Vitamin D deficiency 11/20/2009   Transient cerebral ischemia 06/30/2009   CEREBROVASCULAR DISEASE 06/30/2009   COLONIC POLYPS 01/10/2008  Essential hypertension 01/10/2008   Coronary atherosclerosis 01/10/2008   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   GYNECOMASTIA, UNILATERAL 01/10/2008   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   SHOULDER PAIN 01/10/2008   Type 2 diabetes mellitus ([place]) 01/10/2008    REFERRING DIAG:  Z16.96 (ICD-10-CM) - History of CVA (cerebrovascular accident) I63.81 (ICD-10-CM) - 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 Pt's wife spoke to Dr. Ainsley Spinner nurse and was advised to monitor pt's BP closely.   PAIN:  Are you having pain? No  Today's Vitals   07/06/22 1410  BP: 127/60  Pulse: 72      OBJECTIVE: (objective measures completed at initial evaluation unless otherwise dated)    TODAY'S TREATMENT:  Ther Ex   -Sit <>stands w/red theraband tied around distal quads and no UE support for improved anterior weight shifting, hip strength and immediate standing balance. Pt performed x15 reps and required frequent redirectional cues for reduction of use of hands and sustaining attention to task. Cued pt for increased cadence, as he preferred to perform single rep and rest for 30-45s rather than perform consistent reps. W/cues for increased cadence, pt fatigued very quickly. Added to HEP (see bolded below) and educated wife on proper parameters to perform at home.  -Lateral monster walks w/red theraband at counter for improved step placement and hip strength, down and back x6 w/mod verbal cues to maintain BLE abduction and control eccentric movement of trailing foot.  -SciFit multi-peaks level 7 for 8 minutes using BUE/BLEs for dynamic cardiovascular conditioning, UE/LE coordination and BLE strength. Min cues for increased amplitude of bilateral knee extension throughout.    Gait pattern: step through pattern, decreased stride length, decreased ankle dorsiflexion- Right, scissoring, ataxic, trunk flexed, and poor foot clearance- Right Distance walked: Various clinic distances  Assistive device utilized: Walker - 2 wheeled Level of assistance: SBA Comments: Noted increased scissoring of RLE today but pt demonstrated improvement following monster walks    PATIENT EDUCATION: Education details: Updates to HEP Person educated: Patient and Spouse Education method: Explanation  Education comprehension: verbalized understanding  and needs further education      HOME EXERCISE PROGRAM: Access Code: [email or web address] URL: [email or web address] Date: 07/06/2022 Prepared by: Mickie Bail Sumiye Hirth  Exercises - Sit to Stand Without Arm Support  - 1 x daily - 7 x weekly - 3 sets - 5-7 reps - Heel Raise on Step  - 1 x daily - 7 x weekly - 3 sets - 10 reps - Forward Step Down with Heel Tap and Rail Support  - 1 x daily - 7 x weekly - 3 sets - 10 reps - Sit to Stand with Resistance Around Legs  - 1 x daily - 7 x weekly - 3 sets - 10 reps      GOALS: Goals reviewed with patient? Yes   SHORT TERM GOALS: Target date: 05/30/2022   Patient will demo 0.75ms walking speed with walker to improve gait speed and community ambulation Baseline: 0.50 m/s with walker; 0.588m with walker(06/01/22); 0.59 m/s (06/22/22)  Goal status: not met   2.  Patient will be able to perform sit to stand without HHA to improve functional  strength Baseline: Requires 1-2 HHA from bed or chair to stand up (eval); able to do 2 reps without HHA but requires multiple attempt to stand up Goal status: Goal met.   3.  Patient will report 50% compliance with HEP to improve self management of symptoms Baseline: HEP to be issued 2nd visit Goal status: goal met.     LONG TERM GOALS: Target date: 08/03/2022     Patient will demo gait speed of >0.41ms with LRAD to improve community ambulation and decrease fall risk Baseline: 0.5 m/s with walker; 0.55 m/s (06/01/22); 0.59 m/s (06/22/22) Goal status: progressing   2.  Pt will demo BBS of >50/56 to reduce fall risk Baseline: 42/56 (05/02/22); 50/56 (06/08/22) Goal status: goal met   3.  Patient will demo TUG <15 seconds with LRAD to improve functional mobility Baseline: 21 sec with RW; 26 sec with RW (06/01/22); 23 sec with st. Cane (06/02/22); 18.85 sec with st. Cane but requires CGA for safety (06/22/22) Goal status: Progressing    4. Patient will report 10 points of improvement on FOTO to improve overall  function Baseline: 54 (06/08/22) Goal status: progressing   ASSESSMENT:   CLINICAL IMPRESSION: Emphasis of skilled PT session on transfers, hip strengthening and endurance. Pt requires frequent redirectional cues to sustain attention to task as well as cues for increased speed of movement. Noted pt continues to scissor RLE w/gait training despite cues but did improve foot placement following hip abduction/ER exercises. Continue POC.      OBJECTIVE IMPAIRMENTS Abnormal gait, decreased activity tolerance, decreased balance, decreased endurance, decreased mobility, difficulty walking, decreased ROM, decreased strength, impaired flexibility, improper body mechanics, and postural dysfunction.    ACTIVITY LIMITATIONS cleaning, community activity, and driving.    PERSONAL FACTORS Age, Past/current experiences, Time since onset of injury/illness/exacerbation, and 1-2 comorbidities: TN, HLD, DMII, CAD, carotid artery disease s/p R CEA and history of multiple strokes  are also affecting patient's functional outcome.      REHAB POTENTIAL: Good   CLINICAL DECISION MAKING: Stable/uncomplicated   EVALUATION COMPLEXITY: Low   PLAN: PT FREQUENCY: 2x/week   PT DURATION: 6 weeks   PLANNED INTERVENTIONS: Therapeutic exercises, Therapeutic activity, Neuromuscular re-education, Balance training, Gait training, Patient/Family education, Joint mobilization, Stair training, Orthotic/Fit training, Cryotherapy, Moist heat, and Manual therapy   PLAN FOR NEXT SESSION: referral for new AFO obtained but therapist did not send it off, continue to work on step length, Continue to work on BBS components, gait training, balance, hip strengthening   Adley Castello E Josealfredo Adkins, PT, DPT 07/06/2022, 2:47 PM

## 2022-07-10 ENCOUNTER — Ambulatory Visit (INDEPENDENT_AMBULATORY_CARE_PROVIDER_SITE_OTHER): Payer: Medicare PPO | Admitting: Primary Care

## 2022-07-10 ENCOUNTER — Encounter: Payer: Self-pay | Admitting: Primary Care

## 2022-07-10 VITALS — BP 120/58 | HR 62 | Temp 97.6°F | Ht 73.0 in | Wt 207.5 lb

## 2022-07-10 DIAGNOSIS — E7849 Other hyperlipidemia: Secondary | ICD-10-CM

## 2022-07-10 DIAGNOSIS — E1165 Type 2 diabetes mellitus with hyperglycemia: Secondary | ICD-10-CM

## 2022-07-10 DIAGNOSIS — I10 Essential (primary) hypertension: Secondary | ICD-10-CM

## 2022-07-10 DIAGNOSIS — R2681 Unsteadiness on feet: Secondary | ICD-10-CM | POA: Diagnosis not present

## 2022-07-10 DIAGNOSIS — F419 Anxiety disorder, unspecified: Secondary | ICD-10-CM | POA: Diagnosis not present

## 2022-07-10 DIAGNOSIS — Z8673 Personal history of transient ischemic attack (TIA), and cerebral infarction without residual deficits: Secondary | ICD-10-CM

## 2022-07-10 DIAGNOSIS — R600 Localized edema: Secondary | ICD-10-CM

## 2022-07-10 DIAGNOSIS — F329 Major depressive disorder, single episode, unspecified: Secondary | ICD-10-CM

## 2022-07-10 DIAGNOSIS — Z0000 Encounter for general adult medical examination without abnormal findings: Secondary | ICD-10-CM | POA: Insufficient documentation

## 2022-07-10 DIAGNOSIS — Z0001 Encounter for general adult medical examination with abnormal findings: Secondary | ICD-10-CM

## 2022-07-10 LAB — BASIC METABOLIC PANEL
BUN: 25 mg/dL — ABNORMAL HIGH (ref 6–23)
CO2: 31 mEq/L (ref 19–32)
Calcium: 9 mg/dL (ref 8.4–10.5)
Chloride: 102 mEq/L (ref 96–112)
Creatinine, Ser: 1.08 mg/dL (ref 0.40–1.50)
GFR: 65.66 mL/min (ref >60.00)
Glucose, Bld: 105 mg/dL — ABNORMAL HIGH (ref 70–99)
Potassium: 3.8 mEq/L (ref 3.5–5.1)
Sodium: 140 mEq/L (ref 135–145)

## 2022-07-10 LAB — HEMOGLOBIN A1C: Hgb A1c MFr Bld: 6.4 % (ref 4.6–6.5)

## 2022-07-10 MED ORDER — SERTRALINE HCL 25 MG PO TABS: 25.0000 mg | ORAL_TABLET | Freq: Every day | ORAL | 0 refills | Status: DC

## 2022-07-10 NOTE — Assessment & Plan Note (Signed)
Improving gradually.  Continue outpatient neuro rehab. Continue home health PT.

## 2022-07-10 NOTE — Assessment & Plan Note (Signed)
Improved.  Continue HCTZ 12.5 mg daily. CMP pending.

## 2022-07-10 NOTE — Assessment & Plan Note (Addendum)
Mildly uncontrolled. I do agree that he could benefit from a low-dose SSRI.  We had a long discussion today regarding his goals for complete stroke recovery, his symptoms of depression and anxiety, and treatment options.  He agrees for medication treatment.  Start Zoloft 25 mg daily.  We discussed possible side effects of headache, GI upset, drowsiness, and SI/HI. If thoughts of SI/HI develop, we discussed to present to the emergency immediately. Patient verbalized understanding.   Follow up in 6 weeks for re-evaluation.

## 2022-07-10 NOTE — Assessment & Plan Note (Addendum)
Continues with recovery including home health PT and outpatient neurosurgery rehab.  LDL at goal. Continue atorvastatin 10 mg. Continue Plavix 75 mg daily.  Continue with diabetes and BP control.

## 2022-07-10 NOTE — Assessment & Plan Note (Signed)
Controlled according to last A1C.  Repeat A1C pending.  Continue off medications.   Managed on ARB and statin. Pneumonia vaccine UTD. Eye and foot exams UTD.  Follow up in 6 months.

## 2022-07-10 NOTE — Progress Notes (Signed)
Subjective:    Patient ID: [name] [name], male    DOB: 15-Oct-1941, 81 y.o.   MRN: [id number]  HPI  [name] [name] is a very pleasant 81 y.o. male who presents today for complete physical and follow up of chronic conditions. His daughter and wife join [place] today.   His daughter and wife are concerned about depression as they've overheard various comments he's made, noticed that he's wanting to be in the bed more frequently. He's also constantly worrying about things that do not directly relate to himself.He is ready to get back to driving and being more independent, his loss of independence have contributed to some of his symptoms. He does not feel depressed or overly anxious. His wife would like for him to try a low dose medication for anxiety/depression.   Immunizations: -Tetanus: 2011 -Influenza: Completed last season  -Covid-19: 3 vaccines -Shingles: Never Completed  -Pneumonia: Prevnar 13 in 2018, Pneumovax in 2010  Diet: [place].  Exercise: No regular exercise.  Eye exam: Completes annually  Dental exam: Completes semi-annually   Colonoscopy: Completed in 2013, no further imaging needed.   PSA: Due    BP Readings from Last 3 Encounters:  07/10/22 (!) 120/58  07/06/22 127/60  07/04/22 114/67       Review of Systems  Constitutional:  Negative for unexpected weight change.  HENT:  Negative for rhinorrhea.   Respiratory:  Negative for cough and shortness of breath.   Cardiovascular:  Negative for chest pain and leg swelling.  Gastrointestinal:  Negative for constipation and diarrhea.  Genitourinary:  Negative for difficulty urinating.  Musculoskeletal:  Negative for arthralgias and myalgias.  Skin:  Negative for rash.  Allergic/Immunologic: Negative for environmental allergies.  Neurological:  Positive for weakness and numbness. Negative for dizziness and headaches.  Psychiatric/Behavioral:  The patient is nervous/anxious.        Family is suspicious for  depression.         Past Medical History:  Diagnosis Date   Basal cell carcinoma 11/09/2019   nod & infil-behind right ear-cx3 &exc   Basal cell carcinoma 03/21/2020   Residual BCC with peripheral margin involved - ST recommends MOHs   Carotid artery occlusion    Coronary artery disease    s/p CABG 2005; sees Dr Johnsie Cancel yearly   Diabetes mellitus without complication ([place])    Gynecomastia    History of COVID-19 01/08/2022   Hypercholesterolemia    Hypertension    Neurodermatitis    Overweight(278.02)    Personal history of colonic polyps 10/08/2006   tubular adenomas   Renal insufficiency    Stroke [facility])    TIA  April 2022   Transient ischemic attack 2010   "lasted ~ 5 seconds"   Vitamin D deficiency     Social History   Socioeconomic History   Marital status: Married    Spouse name: [name]   Number of children: 1   Years of education: Not on file   Highest education level: Some college, no degree  Occupational History   Occupation: retired  Tobacco Use   Smoking status: Never   Smokeless tobacco: Former  Scientific laboratory technician Use: Never used  Substance and Sexual Activity   Alcohol use: No    Alcohol/week: 0.0 standard drinks of alcohol   Drug use: No   Sexual activity: Yes  Other Topics Concern   Not on file  Social History Narrative   Retired.   Once worked for the [employer].  Married.   Enjoys reading, spending time with family.    Left handed   Drinks caffeine   One story home   Social Determinants of Health   Financial Resource Strain: Low Risk  (06/14/2022)   Overall Financial Resource Strain (CARDIA)    Difficulty of Paying Living Expenses: Not hard at all  Food Insecurity: No Food Insecurity (06/14/2022)   Hunger Vital Sign    Worried About Running Out of Food in the Last Year: Never true    Ran Out of Food in the Last Year: Never true  Transportation Needs: No Transportation Needs (06/14/2022)   PRAPARE - Hydrologist  (Medical): No    Lack of Transportation (Non-Medical): No  Physical Activity: Insufficiently Active (06/14/2022)   Exercise Vital Sign    Days of Exercise per Week: 3 days    Minutes of Exercise per Session: 30 min  Stress: No Stress Concern Present (06/14/2022)   [place]    Feeling of Stress : Not at all  Social Connections: Moderately Isolated (06/14/2022)   Social Connection and Isolation Panel [NHANES]    Frequency of Communication with Friends and Family: More than three times a week    Frequency of Social Gatherings with Friends and Family: Once a week    Attends Religious Services: Never    Marine scientist or Organizations: No    Attends Archivist Meetings: Never    Marital Status: Married  Human resources officer Violence: Not At Risk (06/14/2022)   Humiliation, Afraid, Rape, and Kick questionnaire    Fear of Current or Ex-Partner: No    Emotionally Abused: No    Physically Abused: No    Sexually Abused: No    Past Surgical History:  Procedure Laterality Date   CARDIAC CATHETERIZATION  02/2004   "tried to stent; couldn't"   CAROTID ENDARTERECTOMY Right 08/26/2015   COLONOSCOPY     CORONARY ANGIOPLASTY     CORONARY ARTERY BYPASS GRAFT  Feb. 2005   4 vessel   ENDARTERECTOMY Right 08/26/2015   Procedure: Right Carotid ENDARTERECTOMY with Patch Angioplasty ;  Surgeon: Rosetta Posner, MD;  Location: [facility] OR;  Service: Vascular;  Laterality: Right;   ENDARTERECTOMY Left 05/18/2021   Procedure: LEFT CAROTID ARTERY ENDARTERECTOMY  with patch angioplasty;  Surgeon: Rosetta Posner, MD;  Location: [facility] OR;  Service: Vascular;  Laterality: Left;   EYE SURGERY     bilateral cataract   KELOID EXCISION  04/2008   on chest scar; Dr. Dessie Coma   KELOID EXCISION     PILONIDAL CYST EXCISION  1989    Family History  Problem Relation Age of Onset   Heart disease Mother        Before age 72   Diabetes Mother     Kidney disease Mother    Heart attack Mother 39   Lung cancer Father 32   Diabetes Brother    Heart disease Brother    Heart disease Brother    Arthritis Brother    Diabetes Sister    Fibromyalgia Sister    Lung cancer Paternal Uncle        questionable as to if it was lung ca   Healthy Daughter    Colon cancer Neg Hx    Stroke Neg Hx     Allergies  Allergen Reactions   Niacin Other (See Comments)    intol to NIACIN w/ headaches  Ativan [Lorazepam] Other (See Comments)    agitation    Current Outpatient Medications on File Prior to Visit  Medication Sig Dispense Refill   atorvastatin (LIPITOR) 20 MG tablet Take 0.5 tablets (10 mg total) by mouth daily. For cholesterol. 45 tablet 1   cholecalciferol (VITAMIN D3) 25 MCG (1000 UNIT) tablet Take 1,000 Units by mouth daily.     clopidogrel (PLAVIX) 75 MG tablet Take 1 tablet (75 mg total) by mouth daily. 90 tablet 3   cyanocobalamin 1000 MCG tablet Take 1 tablet by mouth daily.     glucose blood (ACCU-CHEK GUIDE) test strip USE AS DIRECTED TO TEST BLOOD SUGAR UP TO 4 TIMES DAILY 100 strip 3   hydrochlorothiazide (HYDRODIURIL) 12.5 MG tablet Take 12.5 mg by mouth daily.     losartan (COZAAR) 25 MG tablet TAKE 2 TABLETS (50 MG TOTAL) BY MOUTH DAILY. FOR BLOOD PRESSURE. 180 tablet 1   nitroGLYCERIN (NITROSTAT) 0.4 MG SL tablet Place 1 tablet (0.4 mg total) under the tongue every 5 (five) minutes as needed for chest pain. 25 tablet 3   No current facility-administered medications on file prior to visit.    BP (!) 120/58   Pulse 62   Temp 97.6 F (36.4 C) (Temporal)   Ht '6\' 1"'$  (1.854 m)   Wt 207 lb 8 oz (94.1 kg)   SpO2 96%   BMI 27.38 kg/m  Objective:   Physical Exam HENT:     Right Ear: Tympanic membrane and ear canal normal.     Left Ear: Tympanic membrane and ear canal normal.     Nose: Nose normal.     Right Sinus: No maxillary sinus tenderness or frontal sinus tenderness.     Left Sinus: No maxillary sinus  tenderness or frontal sinus tenderness.  Eyes:     Conjunctiva/sclera: Conjunctivae normal.  Neck:     Thyroid: No thyromegaly.     Vascular: No carotid bruit.  Cardiovascular:     Rate and Rhythm: Normal rate and regular rhythm.     Heart sounds: Normal heart sounds.  Pulmonary:     Effort: Pulmonary effort is normal.     Breath sounds: Normal breath sounds. No wheezing or rales.  Abdominal:     General: Bowel sounds are normal.     Palpations: Abdomen is soft.     Tenderness: There is no abdominal tenderness.  Musculoskeletal:        General: Normal range of motion.     Cervical back: Neck supple.  Skin:    General: Skin is warm and dry.  Neurological:     Mental Status: He is alert and oriented to person, place, and time.     Cranial Nerves: No cranial nerve deficit.     Deep Tendon Reflexes: Reflexes are normal and symmetric.  Psychiatric:        Mood and Affect: Mood normal.           Assessment & Plan:   Problem List Items Addressed This Visit       Cardiovascular and Mediastinum   Essential hypertension    Improved. Asymptomatic.  Continue losartan 50 mg daily, HCTZ 12.5 mg daily. CMP pending.      Relevant Orders   Basic metabolic panel (Completed)     Endocrine   Type 2 diabetes mellitus ([place])    Controlled according to last A1C.  Repeat A1C pending.  Continue off medications.   Managed on ARB and statin. Pneumonia vaccine UTD. Eye and  foot exams UTD.  Follow up in 6 months.      Relevant Orders   Hemoglobin A1c (Completed)     Other   Anxiety, mild    Mildly uncontrolled. I do agree that he could benefit from a low-dose SSRI.  We had a long discussion today regarding his goals for complete stroke recovery, his symptoms of depression and anxiety, and treatment options.  He agrees for medication treatment.  Start Zoloft 25 mg daily.  We discussed possible side effects of headache, GI upset, drowsiness, and SI/HI. If thoughts of  SI/HI develop, we discussed to present to the emergency immediately. Patient verbalized understanding.   Follow up in 6 weeks for re-evaluation.        Relevant Medications   sertraline (ZOLOFT) 25 MG tablet   Hyperlipidemia    Controlled.  Continue atorvastatin 10 mg daily. LDL goal of <55.      Unsteady gait    Improving gradually.  Continue outpatient neuro rehab. Continue home health PT.      Lower extremity edema    Improved.  Continue HCTZ 12.5 mg daily. CMP pending.       History of CVA (cerebrovascular accident)    Continues with recovery including home health PT and outpatient neurosurgery rehab.  LDL at goal. Continue atorvastatin 10 mg. Continue Plavix 75 mg daily.  Continue with diabetes and BP control.       Depression    Mildly uncontrolled. I do agree that he could benefit from a low-dose SSRI.  We had a long discussion today regarding his goals for complete stroke recovery, his symptoms of depression and anxiety, and treatment options.  He agrees for medication treatment.  Start Zoloft 25 mg daily.  We discussed possible side effects of headache, GI upset, drowsiness, and SI/HI. If thoughts of SI/HI develop, we discussed to present to the emergency immediately. Patient verbalized understanding.   Follow up in 6 weeks for re-evaluation.        Relevant Medications   sertraline (ZOLOFT) 25 MG tablet   Encounter for annual general medical examination with abnormal findings in adult - Primary    Shingrix due, discussed with family.  Colonoscopy N/A given age.  See letter sent form [place] in March 2023.  Discussed the importance of a healthy diet and regular exercise in order for weight loss, and to reduce the risk of further co-morbidity.  Exam stable. Labs pending.  Follow up in 1 year for repeat physical.           Pleas Koch, NP

## 2022-07-10 NOTE — Patient Instructions (Signed)
Start sertraline (Zoloft) 25 mg for anxiety and depression.   Stop by the lab prior to leaving today. I will notify you of your results once received.   Please schedule a follow up visit for 6 weeks for follow up of anxiety/depression.  It was a pleasure to see you today!  Preventive Care 7 Years and Older, Male Preventive care refers to lifestyle choices and visits with your health care provider that can promote health and wellness. Preventive care visits are also called wellness exams. What can I expect for my preventive care visit? Counseling During your preventive care visit, your health care provider may ask about your: Medical history, including: Past medical problems. Family medical history. History of falls. Current health, including: Emotional well-being. Home life and relationship well-being. Sexual activity. Memory and ability to understand (cognition). Lifestyle, including: Alcohol, nicotine or tobacco, and drug use. Access to firearms. Diet, exercise, and sleep habits. Work and work Statistician. Sunscreen use. Safety issues such as seatbelt and bike helmet use. Physical exam Your health care provider will check your: Height and weight. These may be used to calculate your BMI (body mass index). BMI is a measurement that tells if you are at a healthy weight. Waist circumference. This measures the distance around your waistline. This measurement also tells if you are at a healthy weight and may help predict your risk of certain diseases, such as type 2 diabetes and high blood pressure. Heart rate and blood pressure. Body temperature. Skin for abnormal spots. What immunizations do I need?  Vaccines are usually given at various ages, according to a schedule. Your health care provider will recommend vaccines for you based on your age, medical history, and lifestyle or other factors, such as travel or where you work. What tests do I need? Screening Your health care  provider may recommend screening tests for certain conditions. This may include: Lipid and cholesterol levels. Diabetes screening. This is done by checking your blood sugar (glucose) after you have not eaten for a while (fasting). Hepatitis C test. Hepatitis B test. HIV (human immunodeficiency virus) test. STI (sexually transmitted infection) testing, if you are at risk. Lung cancer screening. Colorectal cancer screening. Prostate cancer screening. Abdominal aortic aneurysm (AAA) screening. You may need this if you are a current or former smoker. Talk with your health care provider about your test results, treatment options, and if necessary, the need for more tests. Follow these instructions at home: Eating and drinking  Eat a diet that includes fresh fruits and vegetables, whole grains, lean protein, and low-fat dairy products. Limit your intake of foods with high amounts of sugar, saturated fats, and salt. Take vitamin and mineral supplements as recommended by your health care provider. Do not drink alcohol if your health care provider tells you not to drink. If you drink alcohol: Limit how much you have to 0-2 drinks a day. Know how much alcohol is in your drink. In the [place] one drink equals one 12 oz bottle of beer (355 mL), one 5 oz glass of wine (148 mL), or one 1 oz glass of hard liquor (44 mL). Lifestyle Brush your teeth every morning and night with fluoride toothpaste. Floss one time each day. Exercise for at least 30 minutes 5 or more days each week. Do not use any products that contain nicotine or tobacco. These products include cigarettes, chewing tobacco, and vaping devices, such as e-cigarettes. If you need help quitting, ask your health care provider. Do not use drugs. If you  are sexually active, practice safe sex. Use a condom or other form of protection to prevent STIs. Take aspirin only as told by your health care provider. Make sure that you understand how much to  take and what form to take. Work with your health care provider to find out whether it is safe and beneficial for you to take aspirin daily. Ask your health care provider if you need to take a cholesterol-lowering medicine (statin). Find healthy ways to manage stress, such as: Meditation, yoga, or listening to music. Journaling. Talking to a trusted person. Spending time with friends and family. Safety Always wear your seat belt while driving or riding in a vehicle. Do not drive: If you have been drinking alcohol. Do not ride with someone who has been drinking. When you are tired or distracted. While texting. If you have been using any mind-altering substances or drugs. Wear a helmet and other protective equipment during sports activities. If you have firearms in your house, make sure you follow all gun safety procedures. Minimize exposure to UV radiation to reduce your risk of skin cancer. What's next? Visit your health care provider once a year for an annual wellness visit. Ask your health care provider how often you should have your eyes and teeth checked. Stay up to date on all vaccines. This information is not intended to replace advice given to you by your health care provider. Make sure you discuss any questions you have with your health care provider. Document Revised: 06/14/2021 Document Reviewed: 06/14/2021 Elsevier Patient Education  [place].

## 2022-07-10 NOTE — Assessment & Plan Note (Signed)
Improved. Asymptomatic.  Continue losartan 50 mg daily, HCTZ 12.5 mg daily. CMP pending.

## 2022-07-10 NOTE — Assessment & Plan Note (Signed)
Controlled.  Continue atorvastatin 10 mg daily. LDL goal of <55.

## 2022-07-10 NOTE — Assessment & Plan Note (Signed)
Shingrix due, discussed with family.  Colonoscopy N/A given age.  See letter sent form [place] in March 2023.  Discussed the importance of a healthy diet and regular exercise in order for weight loss, and to reduce the risk of further co-morbidity.  Exam stable. Labs pending.  Follow up in 1 year for repeat physical.

## 2022-07-10 NOTE — Assessment & Plan Note (Signed)
Mildly uncontrolled. I do agree that he could benefit from a low-dose SSRI.  We had a long discussion today regarding his goals for complete stroke recovery, his symptoms of depression and anxiety, and treatment options.  He agrees for medication treatment.  Start Zoloft 25 mg daily.  We discussed possible side effects of headache, GI upset, drowsiness, and SI/HI. If thoughts of SI/HI develop, we discussed to present to the emergency immediately. Patient verbalized understanding.   Follow up in 6 weeks for re-evaluation.

## 2022-07-11 ENCOUNTER — Ambulatory Visit: Payer: Medicare PPO

## 2022-07-11 ENCOUNTER — Ambulatory Visit: Payer: Medicare PPO | Admitting: Speech Pathology

## 2022-07-11 DIAGNOSIS — R2689 Other abnormalities of gait and mobility: Secondary | ICD-10-CM | POA: Diagnosis not present

## 2022-07-11 DIAGNOSIS — R2681 Unsteadiness on feet: Secondary | ICD-10-CM | POA: Diagnosis not present

## 2022-07-11 DIAGNOSIS — R1312 Dysphagia, oropharyngeal phase: Secondary | ICD-10-CM | POA: Diagnosis not present

## 2022-07-11 DIAGNOSIS — M6281 Muscle weakness (generalized): Secondary | ICD-10-CM

## 2022-07-11 DIAGNOSIS — R1310 Dysphagia, unspecified: Secondary | ICD-10-CM | POA: Diagnosis not present

## 2022-07-11 NOTE — Therapy (Signed)
OUTPATIENT PHYSICAL THERAPY TREATMENT NOTE   Patient Name: [name] [name] MRN: [id number] DOB:[birth year], 81 y.o., male Today's Date: 07/11/2022  PCP: Pleas Koch, NP, Josue Hector, MD REFERRING PROVIDER: Pleas Koch, NP    END OF SESSION:   PT End of Session - 07/11/22 1406     Visit Number 19    Number of Visits 26    Date for PT Re-Evaluation 08/03/22    Authorization Type [employer] - will need auth    Progress Note Due on Visit 24    PT Start Time 1405    PT Stop Time 1445    PT Time Calculation (min) 40 min    Activity Tolerance Patient tolerated treatment well    Behavior During Therapy [facility] for tasks assessed/performed;Impulsive                Past Medical History:  Diagnosis Date   Basal cell carcinoma 11/09/2019   nod & infil-behind right ear-cx3 &exc   Basal cell carcinoma 03/21/2020   Residual BCC with peripheral margin involved - ST recommends MOHs   Carotid artery occlusion    Coronary artery disease    s/p CABG 2005; sees Dr Johnsie Cancel yearly   Diabetes mellitus without complication ([place])    Gynecomastia    History of COVID-19 01/08/2022   Hypercholesterolemia    Hypertension    Neurodermatitis    Overweight(278.02)    Personal history of colonic polyps 10/08/2006   tubular adenomas   Renal insufficiency    Stroke [facility])    TIA  April 2022   Transient ischemic attack 2010   "lasted ~ 5 seconds"   Vitamin D deficiency    Past Surgical History:  Procedure Laterality Date   CARDIAC CATHETERIZATION  02/2004   "tried to stent; couldn't"   CAROTID ENDARTERECTOMY Right 08/26/2015   COLONOSCOPY     CORONARY ANGIOPLASTY     CORONARY ARTERY BYPASS GRAFT  Feb. 2005   4 vessel   ENDARTERECTOMY Right 08/26/2015   Procedure: Right Carotid ENDARTERECTOMY with Patch Angioplasty ;  Surgeon: Rosetta Posner, MD;  Location: [place];  Service: Vascular;  Laterality: Right;   ENDARTERECTOMY Left 05/18/2021   Procedure: LEFT CAROTID ARTERY  ENDARTERECTOMY  with patch angioplasty;  Surgeon: Rosetta Posner, MD;  Location: [place];  Service: Vascular;  Laterality: Left;   EYE SURGERY     bilateral cataract   KELOID EXCISION  04/2008   on chest scar; Dr. Dessie Coma   KELOID EXCISION     PILONIDAL CYST EXCISION  1989   Patient Active Problem List   Diagnosis Date Noted   Encounter for annual general medical examination with abnormal findings in adult 07/10/2022   Depression 03/22/2022   Stroke-like symptoms 03/01/2022   History of CVA (cerebrovascular accident) 02/28/2022   Aspiration pneumonia vs. 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   TIA (transient ischemic attack) 04/03/2021   Lower extremity edema 08/13/2019   Unsteady gait 06/09/2019   Trigger ring finger of right hand 06/02/2018   Ceruminosis, bilateral 09/16/2017   Hyperlipidemia 01/30/2016   Carotid stenosis 08/26/2015   Anxiety, 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   Carotid artery disease ([place]) 07/01/2012   Special screening for malignant neoplasm of prostate 05/08/2011   Vitamin D deficiency 11/20/2009   Transient cerebral ischemia 06/30/2009   CEREBROVASCULAR DISEASE 06/30/2009   COLONIC POLYPS  01/10/2008   Essential hypertension 01/10/2008   Coronary atherosclerosis 01/10/2008   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   GYNECOMASTIA, UNILATERAL 01/10/2008   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   SHOULDER PAIN 01/10/2008   Type 2 diabetes mellitus ([place]) 01/10/2008    REFERRING DIAG:  M01.02 (ICD-10-CM) - History of CVA (cerebrovascular accident) I63.81 (ICD-10-CM) - 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 They have not been able to do walking consistently at home. Pt   PAIN:  Are you having pain? No  There were no vitals filed for this visit.     OBJECTIVE: (objective measures completed at initial evaluation unless otherwise dated)    TODAY'S TREATMENT:  Reviewed BP readings with patient and wife. They are getting better at tracking it consistently. Gave patient new check list to do exercises and check off every day to improve compliance. Printed 10 pages of this list and gave to patient to track/check off on to do list for exercises to improve compliance  Sit to stand: 2 x 10 rapidly with bil HHA Standing touch toes: 2 x 5 Gait training: Rw for 6', total distance walked 594 feet with rolling walker, reminders for increased step length, visual cues with band on walker to increase step length, standing up taller     PATIENT EDUCATION: Education details: Updates to HEP Person educated: Patient and Spouse Education method: Explanation  Education comprehension: verbalized understanding and needs further education      HOME EXERCISE PROGRAM: Access Code: [email or web address] URL: [email or web address] Date: 07/06/2022 Prepared by: Mickie Bail Plaster  Exercises - Sit to Stand Without Arm Support  - 1 x daily - 7 x weekly - 3 sets - 5-7 reps - Heel Raise on Step  - 1 x daily - 7 x weekly - 3 sets - 10 reps - Forward Step Down with Heel Tap and Rail Support  - 1 x daily - 7 x weekly - 3 sets - 10 reps - Sit to Stand with Resistance Around Legs  - 1 x daily - 7 x weekly - 3 sets - 10 reps      GOALS: Goals reviewed with patient? Yes   SHORT TERM GOALS: Target date: 05/30/2022   Patient will demo 0.72ms walking speed with walker to improve gait speed and community ambulation Baseline: 0.50 m/s with walker; 0.540m with walker(06/01/22); 0.59 m/s (06/22/22)   Goal status: not met   2.  Patient will be able to perform sit to stand without HHA to improve functional strength Baseline: Requires 1-2 HHA from bed or chair to stand up (eval); able to do 2 reps without HHA but requires multiple attempt to stand up Goal status: Goal met.   3.  Patient will report 50% compliance with HEP to improve self management of symptoms Baseline: HEP to be issued 2nd visit Goal status: goal met.     LONG TERM GOALS: Target date: 08/03/2022     Patient will demo gait speed of >0.62m62mwith LRAD to improve community ambulation and decrease fall risk Baseline: 0.5 m/s with walker; 0.55 m/s (06/01/22); 0.59 m/s (06/22/22) Goal status: progressing   2.  Pt will demo BBS of >50/56  to reduce fall risk Baseline: 42/56 (05/02/22); 50/56 (06/08/22) Goal status: goal met   3.  Patient will demo TUG <15 seconds with LRAD to improve functional mobility Baseline: 21 sec with RW; 26 sec with RW (06/01/22); 23 sec with st. Cane (06/02/22); 18.85 sec with st. Cane but requires CGA for safety (06/22/22) Goal status: Progressing    4. Patient will report 10 points of improvement on FOTO to improve overall function Baseline: 54 (06/08/22) Goal status: progressing   ASSESSMENT:   CLINICAL IMPRESSION: Pt demonstrated 594 feet with 6 minute walk today. Pt required cueing to increase step length as he is not able to consistently perform long step length. Pt and wife reports poor compliance with walking program. Tracking grid was made and printed for wife and patient to help improve compliance with key exercises to improve functional outcomes today.     OBJECTIVE IMPAIRMENTS Abnormal gait, decreased activity tolerance, decreased balance, decreased endurance, decreased mobility, difficulty walking, decreased ROM, decreased strength, impaired flexibility, improper body mechanics, and postural dysfunction.    ACTIVITY LIMITATIONS cleaning, community activity, and driving.    PERSONAL FACTORS  Age, Past/current experiences, Time since onset of injury/illness/exacerbation, and 1-2 comorbidities: [place], HLD, DMII, CAD, carotid artery disease s/p R CEA and history of multiple strokes  are also affecting patient's functional outcome.      REHAB POTENTIAL: Good   CLINICAL DECISION MAKING: Stable/uncomplicated   EVALUATION COMPLEXITY: Low   PLAN: PT FREQUENCY: 2x/week   PT DURATION: 6 weeks   PLANNED INTERVENTIONS: Therapeutic exercises, Therapeutic activity, Neuromuscular re-education, Balance training, Gait training, Patient/Family education, Joint mobilization, Stair training, Orthotic/Fit training, Cryotherapy, Moist heat, and Manual therapy   PLAN FOR NEXT SESSION: referral for new AFO obtained but therapist did not send it off, continue to work on step length, Continue to work on [employer], gait training, balance, hip strengthening   [name], PT, DPT 07/11/2022, 4:23 PM

## 2022-07-11 NOTE — Patient Instructions (Signed)
We decided to stick with thickened liquids.   If you want to have some thin liquids these are my recommendations: Do during designated time Limit distractions (no tv, no chatting) Oral care prior to  Use chin tuck and head turn with all swallows Use straw so you can maintain chin tuck position  Small sips  Use of visual aid

## 2022-07-11 NOTE — Therapy (Signed)
OUTPATIENT SPEECH LANGUAGE PATHOLOGY DYSPHAGIA TREATMENT NOTE (RECERT)    Patient Name: [name] [name] MRN: [id number] DOB:[birth year], 81 y.o., male Today's Date: 07/11/2022  PCP: Pleas Koch, NP  REFERRING PROVIDER: Pleas Koch, NP   END OF SESSION:   End of Session - 07/11/22 1318     Visit Number 15    Number of Visits 19   +2 visits for recertification   Date for SLP Re-Evaluation 08/08/22   +4 weeks for recert   Authorization Type [employer]    Progress Note Due on Visit 20    SLP Start Time 1319    SLP Stop Time  1400    SLP Time Calculation (min) 41 min    Activity Tolerance Patient tolerated treatment well                   Past Medical History:  Diagnosis Date   Basal cell carcinoma 11/09/2019   nod & infil-behind right ear-cx3 &exc   Basal cell carcinoma 03/21/2020   Residual BCC with peripheral margin involved - ST recommends [facility]   Carotid artery occlusion    Coronary artery disease    s/p CABG 2005; sees Dr Johnsie Cancel yearly   Diabetes mellitus without complication ([place])    Gynecomastia    History of COVID-19 01/08/2022   Hypercholesterolemia    Hypertension    Neurodermatitis    Overweight(278.02)    Personal history of colonic polyps 10/08/2006   tubular adenomas   Renal insufficiency    Stroke [facility])    TIA  April 2022   Transient ischemic attack 2010   "lasted ~ 5 seconds"   Vitamin D deficiency    Past Surgical History:  Procedure Laterality Date   CARDIAC CATHETERIZATION  02/2004   "tried to stent; couldn't"   CAROTID ENDARTERECTOMY Right 08/26/2015   COLONOSCOPY     CORONARY ANGIOPLASTY     CORONARY ARTERY BYPASS GRAFT  Feb. 2005   4 vessel   ENDARTERECTOMY Right 08/26/2015   Procedure: Right Carotid ENDARTERECTOMY with Patch Angioplasty ;  Surgeon: Rosetta Posner, MD;  Location: [place];  Service: Vascular;  Laterality: Right;   ENDARTERECTOMY Left 05/18/2021   Procedure: LEFT CAROTID ARTERY ENDARTERECTOMY  with patch  angioplasty;  Surgeon: Rosetta Posner, MD;  Location: [facility] OR;  Service: Vascular;  Laterality: Left;   EYE SURGERY     bilateral cataract   KELOID EXCISION  04/2008   on chest scar; Dr. Dessie Coma   KELOID EXCISION     PILONIDAL CYST EXCISION  1989   Patient Active Problem List   Diagnosis Date Noted   Encounter for annual general medical examination with abnormal findings in adult 07/10/2022   Depression 03/22/2022   Stroke-like symptoms 03/01/2022   History of CVA (cerebrovascular accident) 02/28/2022   Aspiration pneumonia vs. 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   Carotid artery disease ([place]) 07/01/2012   Special screening for malignant neoplasm of prostate 05/08/2011   Vitamin D deficiency 11/20/2009   Transient cerebral ischemia 06/30/2009   CEREBROVASCULAR DISEASE 06/30/2009   COLONIC POLYPS 01/10/2008  Essential hypertension 01/10/2008   Coronary atherosclerosis 01/10/2008   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   GYNECOMASTIA, UNILATERAL 01/10/2008   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   SHOULDER PAIN 01/10/2008   Type 2 diabetes mellitus ([place]) 01/10/2008    ONSET DATE: 02/28/2022    REFERRING DIAG: B84.66 (ICD-10-CM) - History of CVA (cerebrovascular accident) R13.10 (ICD-10-CM) - Dysphagia, unspecified type J69.0 (ICD-10-CM) - Aspiration pneumonia, unspecified aspiration pneumonia type, unspecified laterality, unspecified part of lung ([place])   THERAPY DIAG: Dysphagia, unspecified type  SUBJECTIVE:   SUBJECTIVE STATEMENT: Wife and pt report pt is  fatigued from doctors visit yesterday.    PAIN:  Are you having pain? No  OBJECTIVE:   RECOMMENDATIONS FROM OBJECTIVE SWALLOW STUDY (MBSS/FEES):  regular diet, honey tick liquids Objective swallow impairments: Pt presents with persistent dysphagia, but with mild improvements noted since previous MBS in March 2023. Underling deficits still include reduced posterior propulsion with spill into the pharynx as well as delayed swallow initiation with invasion into the airway when boluses reach the pyriform sinuses before the swallow is initiated. This happened with thin liquids when he would elevate his head in between sips. His airway protection still remains optimal with thin liquids when using a chin tuck and head turn L on single sips, which pt can perform during this study but reportedly is having trouble with implementation/carryover per wife. Note that he kept his chin in a tucked position best when using a straw so that he could maintain that position across trials. He still had silent aspiration of nectar thick liquids but it does not appear to occur with the same frequency as it did on previous MBS. Airway protection is variable with nectar thick liquids though, ranging from no airway invasion with sips that are well contained within the valleculae, to frank penetration to the vocal folds, to silent aspiration, and a cued cough does not clear penetrates or aspirates. Honey thick liquids and solids are still best contained above the valleculae and therefore consumed without any aspiration. Objective recommended compensations: cup, no straw; meds with puree, small bite/sips, slow rate  TODAY'S TREATMENT:   DYSPHAGIA TREATMENT:   Current diet: regular and honey thick liquids Swallow precautions: small bites, small sips, alternate bites and sips, chin tuck, head turn, stay upright 30 minutes post meal, hard swallow, no straws, and avoid mixed consistencies Patient directly observed with POs: No   Feeding:  n/a Liquids provided by:  n.a   Oral phase signs and symptoms:  n/a Pharyngeal phase signs and symptoms:  n/a   Therapeutic exercises: Effortful Swallow Types of cueing: verbal Amount of cueing: modified independent  Treatment comments: Pt reports to usual BID completion of daily dysphagia exercises. Discussion on how to optimize increase in exercises frequency. Provided 2x daily and 3x daily option with benefits and cons- pt and spouse decide BID with increased repetitions. Will trial for one week and report back on compliance. SLP reviewed all safety precautions previously addressed: small sip through straw to maintain chin tucked position, limit distractions, keep to certain time of day, use of chin tuck/head turn, free water protocol with optimal oral care for aspiration safety, and PO diet options/recommendations. Pt decides that honey thickened liquids best plan of action for now, with some thin liquid intake using targeted strategies and compensations. SLP provides visual aid to use with thin liquid trials. Plan to re-assess MEP next session. Pt agreeable to frequency decrease to 1x/week for 4 weeks.   HOME EXERCISE PROGRAM: Exercises: Effortful  Swallow 40 repetitions, 3 x/day, Masako 20 repetitions, 3 x/day, and EMST 20 repetitions, 3 x/day  PATIENT EDUCATION: Education details: see above Person educated: Patient and Spouse Education method: Explanation, Demonstration, Verbal cues, and Handouts Education comprehension: verbalized understanding, returned demonstration, and needs further education     ASSESSMENT:   CLINICAL IMPRESSION: Patient is a 81 y.o. M receiving skilled ST targeting orapharyngeal dysphagia. Repeat instrumental swallow study demonstrates mild improvement in swallow function, with ongoing deficits. Demonstrates penetration and aspiration with thin and nectar thick liquids. Impairments in swallow include reduced posterior bolus propulsion, reduced bolus  cohesion, and a pharyngeal delay. Chin tuck and head turn continue to be optimal means of airway protection. Wife, [name], is thickening all liquids for pt. Ongoing education and training of dysphagia exercises completed, with inconsistent compliance and subjective reported improvement in swallow function at home. SLP has faciliated patient and spouse making educated decision regarding ongoing PO diet at home. Pt to continue with thickened liquids. Plan to see patient for 4 more visits to facilitate carryover of compensations for pleasure feeds of thin liquids and modify HEP for continued swallow rehabilitation.   OBJECTIVE IMPAIRMENTS include dysarthria and dysphagia. These impairments are limiting patient from safety when swallowing and effectively communicating at home and in community. Factors affecting potential to achieve goals and functional outcome are ability to learn/carryover information and previous level of function. Patient will benefit from skilled SLP services to address above impairments and improve overall function.   REHAB POTENTIAL: Fair       GOALS: Goals reviewed with patient? Yes   SHORT TERM GOALS: Target date: 06/14/2022     Pt and wife will report compliance with HEP frequency and repetitions with mod-I over 2 sessions. Baseline: 06-08-22, 06-13-22 Goal status: partially met   2.  Pt and wife will report implementation of visual aids at home to support pt recall of targeted swallowing compensations and strategies over 2 sessions.  Baseline: 06-13-22 Goal status: not met   3.  Pt will teach back prescribed dysphagia exercises with rare min-A over 2 sessions Baseline: 06-08-22, 06-13-22 Goal status: met     LONG TERM GOALS: Target date: 08/08/2022 (+4 weeks for recertification)   Pt and spouse will report ongoing compliance with prescribed HEP with mod-I over 1 week period Baseline: 07/06/22, 07-11-22 Goal status: met   2.  Using visual aids, pt with comply with targeted  swallow precautions and compensations with no more than 2 verbal cues over 10 minute period.  Baseline: 07-11-2022 Goal status: met   3.  Pt will improve score on EAT-10 PROM by d/c, indicating subjective improvement in swallow function Baseline:  Goal status: ongoing   4.  Pt will participate in follow up MBSS following therapy course to objectively assess improvement in swallow function and safety.  Baseline: 07/06/22 Goal status: Met  5.  Pt and spouse will report adherence to updated diet and swallow compensations with mod-I over 1 week period.   Baseline:   Goal status: new at recert  6.  Pt and spouse will report compliance with update HEP with mod-I over 1 week period  Baseline:  Goal status: new at recert  7.  Pt and spouse will report successful implementation of agreed upon visual aids at home to support compliance with swallow compensations and strategies.   Baseline:   Goal status: new at recert  8.  Pt will demonstrate targeted swallow compensations/strategies and HEP exercises with rare min-A to ensure understanding of targeted  interventions.   Baseline:  Goal status: new at recert      PLAN: SLP FREQUENCY: 1x/week (decreased frequency at recertification)   SLP DURATION: 12 weeks (+4 weeks for recertification)   PLANNED INTERVENTIONS: Aspiration precaution training, Pharyngeal strengthening exercises, Diet toleration management , Trials of upgraded texture/liquids, SLP instruction and feedback, Compensatory strategies, Patient/family education, and Re-evaluation   Su Monks, [place] 07/11/2022, 1:23 PM

## 2022-07-13 ENCOUNTER — Ambulatory Visit: Payer: Medicare PPO

## 2022-07-13 ENCOUNTER — Ambulatory Visit: Payer: Medicare PPO | Admitting: Speech Pathology

## 2022-07-16 DIAGNOSIS — H903 Sensorineural hearing loss, bilateral: Secondary | ICD-10-CM | POA: Diagnosis not present

## 2022-07-18 ENCOUNTER — Ambulatory Visit: Payer: Medicare PPO | Admitting: Speech Pathology

## 2022-07-18 ENCOUNTER — Ambulatory Visit: Payer: Medicare PPO | Admitting: Physical Therapy

## 2022-07-18 DIAGNOSIS — R2689 Other abnormalities of gait and mobility: Secondary | ICD-10-CM | POA: Diagnosis not present

## 2022-07-18 DIAGNOSIS — M6281 Muscle weakness (generalized): Secondary | ICD-10-CM

## 2022-07-18 DIAGNOSIS — R2681 Unsteadiness on feet: Secondary | ICD-10-CM

## 2022-07-18 DIAGNOSIS — R1310 Dysphagia, unspecified: Secondary | ICD-10-CM | POA: Diagnosis not present

## 2022-07-18 DIAGNOSIS — R1312 Dysphagia, oropharyngeal phase: Secondary | ICD-10-CM | POA: Diagnosis not present

## 2022-07-18 NOTE — Therapy (Signed)
OUTPATIENT SPEECH LANGUAGE PATHOLOGY DYSPHAGIA TREATMENT NOTE (RECERT)    Patient Name: [name] [name] MRN: [id number] DOB:01/26/1941, 81 y.o., male Today's Date: 07/18/2022  PCP: Pleas Koch, NP  REFERRING PROVIDER: Pleas Koch, NP   END OF SESSION:   End of Session - 07/18/22 1406     Visit Number 16    Number of Visits 19   +2 visits for recertification   Date for SLP Re-Evaluation 08/08/22   +4 weeks for recert   Authorization Type [employer]    Progress Note Due on Visit 20    SLP Start Time 24    SLP Stop Time  1432    SLP Time Calculation (min) 32 min    Activity Tolerance Patient tolerated treatment well                   Past Medical History:  Diagnosis Date   Basal cell carcinoma 11/09/2019   nod & infil-behind right ear-cx3 &exc   Basal cell carcinoma 03/21/2020   Residual BCC with peripheral margin involved - ST recommends [facility]   Carotid artery occlusion    Coronary artery disease    s/p CABG 2005; sees Dr Johnsie Cancel yearly   Diabetes [place] ([place])    Gynecomastia    History of COVID-19 01/08/2022   Hypercholesterolemia    Hypertension    Neurodermatitis    Overweight(278.02)    Personal history of colonic polyps 10/08/2006   tubular adenomas   Renal insufficiency    Stroke [facility])    TIA  April 2022   Transient ischemic attack 2010   "lasted ~ 5 seconds"   Vitamin D deficiency    Past Surgical History:  Procedure Laterality Date   CARDIAC CATHETERIZATION  02/2004   "tried to stent; couldn't"   CAROTID ENDARTERECTOMY Right 08/26/2015   COLONOSCOPY     CORONARY ANGIOPLASTY     CORONARY ARTERY BYPASS GRAFT  Feb. 2005   4 vessel   ENDARTERECTOMY Right 08/26/2015   Procedure: Right Carotid ENDARTERECTOMY with Patch Angioplasty ;  Surgeon: Rosetta Posner, MD;  Location: [place];  Service: Vascular;  Laterality: Right;   ENDARTERECTOMY Left 05/18/2021   Procedure: LEFT CAROTID ARTERY ENDARTERECTOMY  with patch  angioplasty;  Surgeon: Rosetta Posner, MD;  [facility] OR;  Service: Vascular;  Laterality: Left;   EYE SURGERY     bilateral cataract   KELOID EXCISION  04/2008   on chest scar; Dr. Dessie Coma   KELOID EXCISION     PILONIDAL CYST EXCISION  1989   Patient Active Problem List   Diagnosis Date Noted   Encounter for annual general medical examination with abnormal findings in adult 07/10/2022   Depression 03/22/2022   Stroke-like symptoms 03/01/2022   History of CVA (cerebrovascular accident) 02/28/2022   Aspiration pneumonia vs. 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   TIA (transient ischemic attack) 04/03/2021   Lower extremity edema 08/13/2019   Unsteady gait 06/09/2019   Trigger ring finger of right hand 06/02/2018   Ceruminosis, bilateral 09/16/2017   Hyperlipidemia 01/30/2016   Carotid stenosis 08/26/2015   Anxiety, 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   Carotid artery disease ([place]) 07/01/2012   Special screening for malignant neoplasm of prostate 05/08/2011   Vitamin D deficiency 11/20/2009   Transient cerebral ischemia 06/30/2009   CEREBROVASCULAR DISEASE 06/30/2009   COLONIC POLYPS 01/10/2008  Essential hypertension 01/10/2008   Coronary atherosclerosis 01/10/2008   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   GYNECOMASTIA, UNILATERAL 01/10/2008   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  SUBJECTIVE:   SUBJECTIVE STATEMENT: Wife and pt report pt is fatigued  from doctors visit yesterday.    PAIN:  Are you having pain? No  OBJECTIVE:   RECOMMENDATIONS FROM OBJECTIVE SWALLOW STUDY (MBSS/FEES):  regular diet, honey tick liquids Objective swallow impairments: Pt presents with persistent dysphagia, but with mild improvements noted since previous MBS in March 2023. Underling deficits still include reduced posterior propulsion with spill into the pharynx as well as delayed swallow initiation with invasion into the airway when boluses reach the pyriform sinuses before the swallow is initiated. This happened with thin liquids when he would elevate his head in between sips. His airway protection still remains optimal with thin liquids when using a chin tuck and head turn L on single sips, which pt can perform during this study but reportedly is having trouble with implementation/carryover per wife. Note that he kept his chin in a tucked position best when using a straw so that he could maintain that position across trials. He still had silent aspiration of nectar thick liquids but it does not appear to occur with the same frequency as it did on previous MBS. Airway protection is variable with nectar thick liquids though, ranging from no airway invasion with sips that are well contained within the valleculae, to frank penetration to the vocal folds, to silent aspiration, and a cued cough does not clear penetrates or aspirates. Honey thick liquids and solids are still best contained above the valleculae and therefore consumed without any aspiration. Objective recommended compensations: cup, no straw; meds with puree, small bite/sips, slow rate  TODAY'S TREATMENT:   DYSPHAGIA TREATMENT:   Current diet: regular and honey thick liquids Swallow precautions: small bites, small sips, alternate bites and sips, chin tuck, head turn, stay upright 30 minutes post meal, hard swallow, no straws, and avoid mixed consistencies Patient directly observed with POs: No  Feeding:   n/a Liquids provided by:  n.a   Oral phase signs and symptoms:  n/a Pharyngeal phase signs and symptoms:  n/a   Therapeutic exercises: Effortful Swallow Types of cueing: verbal Amount of cueing: modified independent  Treatment comments: Pt and spouse report compliance with diet recommendations. Have not trialled thin liquids at home with use of compensations. Given scenario of having thin liquids, pt able to teach back 4/5 strategies with mod-I, SLP assists in recall of avoid distractions. Provide additional education on rationale for recommendations. Given rare min-A, pt able to demonstrate all prescribed exercises. SLP conducted reassessment of MEP for EMST. Pt's maximum expiratory pressure today was 103 cm H2O. EMST 150 device was started at 75% MEP at 77 cm H2O. SLP provided demonstration and verbal instruction to utilize EMST device. Pt completed 5 repetitions over 1 sets during our session. Instruction to continue with HEP. Provided handout to aid in accurate completion of EMST.   HOME EXERCISE PROGRAM: Exercises: Effortful Swallow 40 repetitions, 3 x/day, Masako 20 repetitions, 3 x/day, and EMST 20 repetitions, 3 x/day  PATIENT EDUCATION:  Education details: see above Person educated: Patient and Spouse Education method: Explanation, Demonstration, Verbal cues, and Handouts Education comprehension: verbalized understanding, returned demonstration, and needs further education     ASSESSMENT:   CLINICAL IMPRESSION: Patient is a 81 y.o. M receiving skilled ST targeting orapharyngeal dysphagia. Repeat instrumental swallow study demonstrates mild improvement in swallow function, with ongoing deficits. Demonstrates penetration and aspiration with thin and nectar thick liquids. Impairments in swallow include reduced posterior bolus propulsion, reduced bolus cohesion, and a pharyngeal delay. Chin tuck and head turn continue to be optimal means of airway protection. Wife, [name], is thickening all  liquids for pt. Ongoing education and training of dysphagia exercises completed, with inconsistent compliance and subjective reported improvement in swallow function at home. SLP has faciliated patient and spouse making educated decision regarding ongoing PO diet at home. Pt to continue with thickened liquids. Plan to see patient for 4 more visits to facilitate carryover of compensations for pleasure feeds of thin liquids and modify HEP for continued swallow rehabilitation.   OBJECTIVE IMPAIRMENTS include dysarthria and dysphagia. These impairments are limiting patient from safety when swallowing and effectively communicating at home and in community. Factors affecting potential to achieve goals and functional outcome are ability to learn/carryover information and previous level of function. Patient will benefit from skilled SLP services to address above impairments and improve overall function.   REHAB POTENTIAL: Fair       GOALS: Goals reviewed with patient? Yes   SHORT TERM GOALS: Target date: 06/14/2022     Pt and wife will report compliance with HEP frequency and repetitions with mod-I over 2 sessions. Baseline: 06-08-22, 06-13-22 Goal status: partially met   2.  Pt and wife will report implementation of visual aids at home to support pt recall of targeted swallowing compensations and strategies over 2 sessions.  Baseline: 06-13-22 Goal status: not met   3.  Pt will teach back prescribed dysphagia exercises with rare min-A over 2 sessions Baseline: 06-08-22, 06-13-22 Goal status: met     LONG TERM GOALS: Target date: 08/08/2022 (+4 weeks for recertification)   Pt and spouse will report ongoing compliance with prescribed HEP with mod-I over 1 week period Baseline: 07/06/22, 07-11-22 Goal status: met   2.  Using visual aids, pt with comply with targeted swallow precautions and compensations with no more than 2 verbal cues over 10 minute period.  Baseline: 07-11-2022 Goal status: met   3.  Pt  will improve score on EAT-10 PROM by d/c, indicating subjective improvement in swallow function Baseline:  Goal status: ongoing   4.  Pt will participate in follow up MBSS following therapy course to objectively assess improvement in swallow function and safety.  Baseline: 07/06/22 Goal status: Met  5.  Pt and spouse will report adherence to updated diet and swallow compensations with mod-I over 1 week period.   Baseline:   Goal status: new at recert  6.  Pt and spouse will report compliance with update HEP with mod-I over 1 week period  Baseline:  Goal status: new at recert  7.  Pt and spouse will report successful implementation of agreed upon visual aids at home to support compliance with swallow compensations and strategies.   Baseline:   Goal status: new at recert  8.  Pt will demonstrate targeted swallow compensations/strategies and HEP exercises with rare min-A to ensure understanding of targeted interventions.   Baseline:  Goal status: new at recert      PLAN: SLP FREQUENCY: 1x/week (  decreased frequency at recertification)   SLP DURATION: 12 weeks (+4 weeks for recertification)   PLANNED INTERVENTIONS: Aspiration precaution training, Pharyngeal strengthening exercises, Diet toleration management , Trials of upgraded texture/liquids, SLP instruction and feedback, Compensatory strategies, Patient/family education, and Re-evaluation   Su Monks, [place] 07/18/2022, 2:08 PM

## 2022-07-18 NOTE — Therapy (Signed)
OUTPATIENT PHYSICAL THERAPY TREATMENT NOTE- 20th VISIT PROGRESS NOTE    Patient Name: [name] [name] MRN: [id number] DOB:11-23-1941, 81 y.o., male Today's Date: 07/18/2022  PCP: Pleas Koch, NP, Josue Hector, MD REFERRING PROVIDER: Pleas Koch, NP    Physical Therapy Progress Note   Dates of Reporting Period:05/02/2022 - 07/18/2022  See Note below for Objective Data and Assessment of Progress/Goals.  Thank you for the referral of this patient. Mickie Bail Leeloo Silverthorne, PT, DPT   END OF SESSION:   PT End of Session - 07/18/22 1317     Visit Number 20    Number of Visits 26    Date for PT Re-Evaluation 08/03/22    Authorization Type [employer] - will need auth    Progress Note Due on Visit 24    PT Start Time 1315    PT Stop Time 1400    PT Time Calculation (min) 45 min    Equipment Utilized During Treatment Gait belt    Activity Tolerance Patient tolerated treatment well    Behavior During Therapy WFL for tasks assessed/performed                 Past Medical History:  Diagnosis Date   Basal cell carcinoma 11/09/2019   nod & infil-behind right ear-cx3 &exc   Basal cell carcinoma 03/21/2020   Residual BCC with peripheral margin involved - ST recommends MOHs   Carotid artery occlusion    Coronary artery disease    s/p CABG 2005; sees Dr Johnsie Cancel yearly   Diabetes mellitus without complication ([place])    Gynecomastia    History of COVID-19 01/08/2022   Hypercholesterolemia    Hypertension    Neurodermatitis    Overweight(278.02)    Personal history of colonic polyps 10/08/2006   tubular adenomas   Renal insufficiency    Stroke [facility])    TIA  April 2022   Transient ischemic attack 2010   "lasted ~ 5 seconds"   Vitamin D deficiency    Past Surgical History:  Procedure Laterality Date   CARDIAC CATHETERIZATION  02/2004   "tried to stent; couldn't"   CAROTID ENDARTERECTOMY Right 08/26/2015   COLONOSCOPY     CORONARY ANGIOPLASTY     CORONARY ARTERY  BYPASS GRAFT  Feb. 2005   4 vessel   ENDARTERECTOMY Right 08/26/2015   Procedure: Right Carotid ENDARTERECTOMY with Patch Angioplasty ;  Surgeon: Rosetta Posner, MD;  Location: [facility] OR;  Service: Vascular;  Laterality: Right;   ENDARTERECTOMY Left 05/18/2021   Procedure: LEFT CAROTID ARTERY ENDARTERECTOMY  with patch angioplasty;  Surgeon: Rosetta Posner, MD;  Location: [place];  Service: Vascular;  Laterality: Left;   EYE SURGERY     bilateral cataract   KELOID EXCISION  04/2008   on chest scar; Dr. Dessie Coma   KELOID EXCISION     PILONIDAL CYST EXCISION  1989   Patient Active Problem List   Diagnosis Date Noted   Encounter for annual general medical examination with abnormal findings in adult 07/10/2022   Depression 03/22/2022   Stroke-like symptoms 03/01/2022   History of CVA (cerebrovascular accident) 02/28/2022   Aspiration pneumonia vs. 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   Carotid artery disease ([place]) 07/01/2012   Special screening for malignant neoplasm of prostate 05/08/2011   Vitamin D deficiency 11/20/2009   Transient cerebral ischemia 06/30/2009   CEREBROVASCULAR DISEASE 06/30/2009   COLONIC POLYPS 01/10/2008   Essential hypertension 01/10/2008   Coronary atherosclerosis 01/10/2008   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   GYNECOMASTIA, UNILATERAL 01/10/2008   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   SHOULDER PAIN 01/10/2008   Type 2 diabetes mellitus ([place]) 01/10/2008    REFERRING DIAG:  H70.26 (ICD-10-CM) - History of CVA (cerebrovascular accident)  I63.81 (ICD-10-CM) - 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 Pt reports being in the process of getting hearing aids for both ears. Pt states he has started a low-dose of Zoloft and has been dealing with ongoing fatigue.  PAIN:  Are you having pain? No  There were no vitals filed for this visit.     OBJECTIVE: (objective measures completed at initial evaluation unless otherwise dated)    TODAY'S TREATMENT:  Self-care/home management  Discussed purpose of PT, importance of carryover via consistency w/exercise at home, and limited improvement in gait kinematics/speed since eval. Wife reporting difficulty w/getting pt to walk due to constant appointments (doctor, PT, etc). Educated pt and wife on purpose of PT to set pt up with program to continue with at home as pt will require frequency and consistency in order to see gains and pt will not show progress if only exercising during scheduled PT sessions. Inquired whether PT is more of a deterrent for exercise at home, pt and wife unable to comprehend question. Wife reports pt is very fatigued during day, discussed difference between medication-induced drowsiness and impaired endurance leading to fatigue with activity. Encouraged pt to perform HIIT walking program (walking for 1 minute, rest for 2 minutes, repeat x5) for improved stamina and high quality of gait.   Gait:  The following treadmill training was completed for aerobic/neural priming, endurance, and gait speed.  - 2:07, 2:00, 1:36 min up to 1.4  mph  Gait training over treadmill with cues for increased LE clearance (LLE >RLE). Pt exhibits decreased attention to task and gets distracted easily w/verbal cues, requiring therapist to hit emergency stop button when cues provided.  Educated pt and wife on safety awareness and understanding pt's impaired selective attention to facilitate improved sustained attention to task. Regressed to utilization of auditory cues of BLE "scuffing" on treadmill to encourage pt to listen to fit to increase BLE clearance. Pt exhibits improved posture and LE clearance at speed reduction 1.2 mph vs 1.4 mph. Frequent use of emergency stop button due to poor postural control and over-reliance on UE support.  Pt able to tolerate roughly 45-60s of walking w/adequate step clearance before regressing to bilateral shuffling and forward trunk lean due to fatigue and required short seated rest breaks between trials. Used this to design pt's walking program as described above.    PATIENT EDUCATION: Education details: See self-care section  Person educated: Patient and Spouse Education method: Explanation  Education comprehension: verbalized understanding and needs further education      HOME EXERCISE PROGRAM: Access Code: [email or web address] URL: [email or web address] Date: 07/06/2022 Prepared by: Mickie Bail Paighton Godette  Exercises - Sit to Stand Without Arm Support  - 1 x daily - 7 x weekly - 3 sets - 5-7 reps - Heel Raise on Step  - 1 x daily - 7 x weekly - 3 sets - 10 reps - Forward Step Down with Heel Tap and Rail Support  - 1 x daily - 7 x weekly - 3 sets - 10 reps - Sit to Stand with Resistance Around Legs  - 1 x daily - 7 x weekly - 3 sets - 10 reps      GOALS: Goals reviewed with patient? Yes   SHORT TERM GOALS: Target date: 05/30/2022   Patient will demo 0.14ms walking speed with walker to improve gait speed and community ambulation Baseline: 0.50 m/s with walker; 0.555m with walker(06/01/22); 0.59 m/s  (06/22/22)  Goal status: not met   2.  Patient will be able to perform sit to stand without HHA to improve functional strength Baseline: Requires 1-2 HHA from bed or chair to stand up (eval); able to do 2 reps without HHA but requires multiple attempt to stand up Goal status: Goal met.   3.  Patient will report 50% compliance with HEP to improve self management of symptoms Baseline: HEP to be issued 2nd visit Goal status: goal met.     LONG TERM GOALS: Target date: 08/03/2022  Patient will demo gait speed of >0.59m37mwith LRAD to improve community ambulation and decrease fall risk Baseline: 0.5 m/s with walker; 0.55 m/s (06/01/22); 0.59 m/s (06/22/22) Goal status: progressing   2.  Pt will demo BBS of >50/56 to reduce fall risk Baseline: 42/56 (05/02/22); 50/56 (06/08/22) Goal status: goal met   3.  Patient will demo TUG <15 seconds with LRAD to improve functional mobility Baseline: 21 sec with RW; 26 sec with RW (06/01/22); 23 sec with st. Cane (06/02/22); 18.85 sec with st. Cane but requires CGA for safety (06/22/22) Goal status: Progressing    4. Patient will report 10 points of improvement on FOTO to improve overall function Baseline: 54 (06/08/22) Goal status: progressing   ASSESSMENT:   CLINICAL IMPRESSION: 20th Visit PN: Emphasis of skilled PT session on patient and caregiver education and gait training w/emphasis on step clearance and gait speed. Pt continues to be limited by poor endurance, impaired selective attention and decreased gait speed and step clearance (LLE > RLE). Pt has demonstrated little to no recall or retention between sessions and pt's wife reports difficulty w/compliance at home due to fatigue and busy medical schedule. Informed pt of his very minimal progress w/gait speed during therapy so far and importance of working on walking at home consistently to see improvements, as 45 minutes 2x/week is not enough.  Continue POC.    Education regarding importance of consistency  with walking at home outside of therapy to see progress. Utilized treadmill for gait training during session with focus on improving gait kinematics and increasing safety awareness during gait. Pt continues to exhibit impaired endurance limiting his ability to participate in functional gait training for extended periods of time (longer than 1-2 min).    OBJECTIVE IMPAIRMENTS Abnormal gait, decreased activity tolerance, decreased balance, decreased endurance, decreased mobility, difficulty walking, decreased ROM, decreased strength, impaired flexibility, improper body mechanics, and postural dysfunction.    ACTIVITY  LIMITATIONS cleaning, community activity, and driving.    PERSONAL FACTORS Age, Past/current experiences, Time since onset of injury/illness/exacerbation, and 1-2 comorbidities: TN, HLD, DMII, CAD, carotid artery disease s/p R CEA and history of multiple strokes  are also affecting patient's functional outcome.      REHAB POTENTIAL: Good   CLINICAL DECISION MAKING: Stable/uncomplicated   EVALUATION COMPLEXITY: Low   PLAN: PT FREQUENCY: 2x/week   PT DURATION: 6 weeks   PLANNED INTERVENTIONS: Therapeutic exercises, Therapeutic activity, Neuromuscular re-education, Balance training, Gait training, Patient/Family education, Joint mobilization, Stair training, Orthotic/Fit training, Cryotherapy, Moist heat, and Manual therapy   PLAN FOR NEXT SESSION: Treadmill interval training, referral for new AFO obtained but therapist did not send it off, continue to work on step length, Continue to work on BBS components, gait training, balance, hip strengthening    E , PT, DPT 07/18/2022, 3:55 PM

## 2022-07-18 NOTE — Patient Instructions (Addendum)
EMST (blowing thing)  Deep inhale in   Put mouth around the mouthpiece  Blow HARD and FAST  Should only hear the whistle sound, do not let air out around the mouth piece  Take short break in between each repetition

## 2022-07-20 ENCOUNTER — Ambulatory Visit: Payer: Medicare PPO

## 2022-07-20 ENCOUNTER — Encounter: Payer: Medicare PPO | Admitting: Speech Pathology

## 2022-07-20 DIAGNOSIS — M6281 Muscle weakness (generalized): Secondary | ICD-10-CM

## 2022-07-20 DIAGNOSIS — R2689 Other abnormalities of gait and mobility: Secondary | ICD-10-CM | POA: Diagnosis not present

## 2022-07-20 DIAGNOSIS — R2681 Unsteadiness on feet: Secondary | ICD-10-CM

## 2022-07-20 DIAGNOSIS — R1312 Dysphagia, oropharyngeal phase: Secondary | ICD-10-CM | POA: Diagnosis not present

## 2022-07-20 DIAGNOSIS — R1310 Dysphagia, unspecified: Secondary | ICD-10-CM | POA: Diagnosis not present

## 2022-07-20 NOTE — Therapy (Signed)
OUTPATIENT PHYSICAL THERAPY TREATMENT NOTE- 20th VISIT PROGRESS NOTE    Patient Name: [name] [name] MRN: [id number] DOB:[birth year], 81 y.o., male Today's Date: 07/20/2022  PCP: Pleas Koch, NP, Josue Hector, MD REFERRING PROVIDER: Pleas Koch, NP    Physical Therapy Progress Note   Dates of Reporting Period:05/02/2022 - 07/18/2022  See Note below for Objective Data and Assessment of Progress/Goals.  Thank you for the referral of this patient. Mickie Bail Plaster, PT, DPT   END OF SESSION:   PT End of Session - 07/20/22 1406     Visit Number 21    Number of Visits 26    Date for PT Re-Evaluation 08/03/22    Authorization Type [employer] - will need auth    Progress Note Due on Visit 24    PT Start Time 1400    PT Stop Time 0174    PT Time Calculation (min) 45 min    Equipment Utilized During Treatment Gait belt    Activity Tolerance Patient tolerated treatment well    Behavior During Therapy WFL for tasks assessed/performed                 Past Medical History:  Diagnosis Date   Basal cell carcinoma 11/09/2019   nod & infil-behind right ear-cx3 &exc   Basal cell carcinoma 03/21/2020   Residual BCC with peripheral margin involved - ST recommends MOHs   Carotid artery occlusion    Coronary artery disease    s/p CABG 2005; sees Dr Johnsie Cancel yearly   Diabetes mellitus without complication ([place])    Gynecomastia    History of COVID-19 01/08/2022   Hypercholesterolemia    Hypertension    Neurodermatitis    Overweight(278.02)    Personal history of colonic polyps 10/08/2006   tubular adenomas   Renal insufficiency    Stroke [facility])    TIA  April 2022   Transient ischemic attack 2010   "lasted ~ 5 seconds"   Vitamin D deficiency    Past Surgical History:  Procedure Laterality Date   CARDIAC CATHETERIZATION  02/2004   "tried to stent; couldn't"   CAROTID ENDARTERECTOMY Right 08/26/2015   COLONOSCOPY     CORONARY ANGIOPLASTY     CORONARY ARTERY  BYPASS GRAFT  Feb. 2005   4 vessel   ENDARTERECTOMY Right 08/26/2015   Procedure: Right Carotid ENDARTERECTOMY with Patch Angioplasty ;  Surgeon: Rosetta Posner, MD;  Location: [facility] OR;  Service: Vascular;  Laterality: Right;   ENDARTERECTOMY Left 05/18/2021   Procedure: LEFT CAROTID ARTERY ENDARTERECTOMY  with patch angioplasty;  Surgeon: Rosetta Posner, MD;  Location: [place];  Service: Vascular;  Laterality: Left;   EYE SURGERY     bilateral cataract   KELOID EXCISION  04/2008   on chest scar; Dr. Dessie Coma   KELOID EXCISION     PILONIDAL CYST EXCISION  1989   Patient Active Problem List   Diagnosis Date Noted   Encounter for annual general medical examination with abnormal findings in adult 07/10/2022   Depression 03/22/2022   Stroke-like symptoms 03/01/2022   History of CVA (cerebrovascular accident) 02/28/2022   Aspiration pneumonia vs. 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   TIA (transient ischemic attack) 04/03/2021   Lower extremity edema 08/13/2019   Unsteady gait 06/09/2019   Trigger ring finger of right hand 06/02/2018   Ceruminosis, bilateral  09/16/2017   Hyperlipidemia 01/30/2016   Carotid stenosis 08/26/2015   Anxiety, 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   Carotid artery disease ([place]) 07/01/2012   Special screening for malignant neoplasm of prostate 05/08/2011   Vitamin D deficiency 11/20/2009   Transient cerebral ischemia 06/30/2009   CEREBROVASCULAR DISEASE 06/30/2009   COLONIC POLYPS 01/10/2008   Essential hypertension 01/10/2008   Coronary atherosclerosis 01/10/2008   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   GYNECOMASTIA, UNILATERAL 01/10/2008   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   SHOULDER PAIN 01/10/2008   Type 2 diabetes mellitus ([place]) 01/10/2008    REFERRING DIAG:  U04.54 (ICD-10-CM) - History of CVA (cerebrovascular accident)  I63.81 (ICD-10-CM) - 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 They are not able to get all 3 exercises consistently but they are tracking it more.  PAIN:  Are you having pain? No  There were no vitals filed for this visit.     OBJECTIVE: (objective measures completed at initial evaluation unless otherwise dated)    TODAY'S TREATMENT:   10 x sit to stand no UE support, min A as pt fatigued after 5 reps 2 x 5 OH reach to touching toes Intermittent training: 2 min walking (230') with 2-3 min rest, with RW with cues to faster walk, improved step length and kicking the band on your walker. Total of 5 intervals Standing deadlift with surge storm (15lbs): 5x, 5x with biceps curls and bil Chest press.    PATIENT EDUCATION: Education details: See self-care section  Person educated: Patient and Spouse Education method: Explanation  Education comprehension: verbalized understanding and needs further education      HOME EXERCISE PROGRAM: Access Code: [email or web address] URL: [email or web address] Date: 07/06/2022 Prepared by: Mickie Bail Plaster  Exercises - Sit to Stand Without Arm Support  - 1 x daily - 7 x weekly - 3 sets - 5-7 reps - Heel Raise on Step  - 1 x daily - 7 x weekly - 3 sets - 10 reps - Forward Step Down with Heel Tap and Rail Support  - 1 x daily - 7 x weekly - 3 sets - 10 reps - Sit to Stand with Resistance Around Legs  - 1 x daily - 7 x weekly - 3 sets - 10 reps      GOALS: Goals reviewed with patient? Yes   SHORT TERM GOALS: Target date: 05/30/2022   Patient will demo 0.69ms walking speed with walker to  improve gait speed and community ambulation Baseline: 0.50 m/s with walker; 0.566m with walker(06/01/22); 0.59 m/s (06/22/22)  Goal status: not met   2.  Patient will be able to perform sit to stand without HHA to improve functional strength Baseline: Requires 1-2 HHA from bed or chair to stand up (eval); able to do 2 reps without HHA but requires multiple attempt to stand up Goal status: Goal met.   3.  Patient will report 50% compliance with HEP to improve self management of symptoms Baseline: HEP to be issued 2nd visit Goal status: goal met.     LONG TERM GOALS: Target date: 08/03/2022  Patient will demo gait speed of >0.55m755mwith LRAD to improve community  ambulation and decrease fall risk Baseline: 0.5 m/s with walker; 0.55 m/s (06/01/22); 0.59 m/s (06/22/22) Goal status: progressing   2.  Pt will demo BBS of >50/56 to reduce fall risk Baseline: 42/56 (05/02/22); 50/56 (06/08/22) Goal status: goal met   3.  Patient will demo TUG <15 seconds with LRAD to improve functional mobility Baseline: 21 sec with RW; 26 sec with RW (06/01/22); 23 sec with st. Cane (06/02/22); 18.85 sec with st. Cane but requires CGA for safety (06/22/22) Goal status: Progressing    4. Patient will report 10 points of improvement on FOTO to improve overall function Baseline: 54 (06/08/22) Goal status: progressing   ASSESSMENT:   CLINICAL IMPRESSION: We worked on intermittent walking and improving HEP compliance. Wife is starting to track exercises and it seems that HEP compliance is slowly improving.  Education regarding importance of consistency with walking at home outside of therapy to see progress. Utilized treadmill for gait training during session with focus on improving gait kinematics and increasing safety awareness during gait. Pt continues to exhibit impaired endurance limiting his ability to participate in functional gait training for extended periods of time (longer than 1-2 min).    OBJECTIVE IMPAIRMENTS  Abnormal gait, decreased activity tolerance, decreased balance, decreased endurance, decreased mobility, difficulty walking, decreased ROM, decreased strength, impaired flexibility, improper body mechanics, and postural dysfunction.    ACTIVITY LIMITATIONS cleaning, community activity, and driving.    PERSONAL FACTORS Age, Past/current experiences, Time since onset of injury/illness/exacerbation, and 1-2 comorbidities: TN, HLD, DMII, CAD, carotid artery disease s/p R CEA and history of multiple strokes  are also affecting patient's functional outcome.      REHAB POTENTIAL: Good   CLINICAL DECISION MAKING: Stable/uncomplicated   EVALUATION COMPLEXITY: Low   PLAN: PT FREQUENCY: 2x/week   PT DURATION: 6 weeks   PLANNED INTERVENTIONS: Therapeutic exercises, Therapeutic activity, Neuromuscular re-education, Balance training, Gait training, Patient/Family education, Joint mobilization, Stair training, Orthotic/Fit training, Cryotherapy, Moist heat, and Manual therapy   PLAN FOR NEXT SESSION: Treadmill interval training, referral for new AFO obtained but therapist did not send it off, continue to work on step length, Continue to work on [employer], gait training, balance, hip strengthening   [name], PT, DPT 07/20/2022, 2:39 PM

## 2022-07-25 ENCOUNTER — Ambulatory Visit: Payer: Medicare PPO

## 2022-07-25 ENCOUNTER — Ambulatory Visit: Payer: Medicare PPO | Admitting: Speech Pathology

## 2022-07-25 DIAGNOSIS — R2689 Other abnormalities of gait and mobility: Secondary | ICD-10-CM | POA: Diagnosis not present

## 2022-07-25 DIAGNOSIS — R2681 Unsteadiness on feet: Secondary | ICD-10-CM

## 2022-07-25 DIAGNOSIS — R1312 Dysphagia, oropharyngeal phase: Secondary | ICD-10-CM | POA: Diagnosis not present

## 2022-07-25 DIAGNOSIS — R1310 Dysphagia, unspecified: Secondary | ICD-10-CM | POA: Diagnosis not present

## 2022-07-25 DIAGNOSIS — M6281 Muscle weakness (generalized): Secondary | ICD-10-CM | POA: Diagnosis not present

## 2022-07-25 NOTE — Therapy (Signed)
OUTPATIENT PHYSICAL THERAPY TREATMENT NOTE-   Patient Name: [name] [name] MRN: [id number] DOB:[birth year], 81 y.o., male Today's Date: 07/25/2022  PCP: Pleas Koch, NP, Josue Hector, MD REFERRING PROVIDER: Pleas Koch, NP    Physical Therapy Progress Note   Dates of Reporting Period:05/02/2022 - 07/18/2022  See Note below for Objective Data and Assessment of Progress/Goals.  Thank you for the referral of this patient. Mickie Bail Plaster, PT, DPT   END OF SESSION:   PT End of Session - 07/25/22 1419     Visit Number 22    Number of Visits 26    Date for PT Re-Evaluation 08/03/22    Authorization Type [employer] - will need auth    Progress Note Due on Visit 24    PT Start Time 1400    PT Stop Time 7673    PT Time Calculation (min) 45 min    Equipment Utilized During Treatment Gait belt    Activity Tolerance Patient tolerated treatment well    Behavior During Therapy WFL for tasks assessed/performed                 Past Medical History:  Diagnosis Date   Basal cell carcinoma 11/09/2019   nod & infil-behind right ear-cx3 &exc   Basal cell carcinoma 03/21/2020   Residual BCC with peripheral margin involved - ST recommends MOHs   Carotid artery occlusion    Coronary artery disease    s/p CABG 2005; sees Dr Johnsie Cancel yearly   Diabetes mellitus without complication ([place])    Gynecomastia    History of COVID-19 01/08/2022   Hypercholesterolemia    Hypertension    Neurodermatitis    Overweight(278.02)    Personal history of colonic polyps 10/08/2006   tubular adenomas   Renal insufficiency    Stroke [facility]    TIA  April 2022   Transient ischemic attack 2010   "lasted ~ 5 seconds"   Vitamin D deficiency    Past Surgical History:  Procedure Laterality Date   CARDIAC CATHETERIZATION  02/2004   "tried to stent; couldn't"   CAROTID ENDARTERECTOMY Right 08/26/2015   COLONOSCOPY     CORONARY ANGIOPLASTY     CORONARY ARTERY BYPASS GRAFT  Feb. 2005    4 vessel   ENDARTERECTOMY Right 08/26/2015   Procedure: Right Carotid ENDARTERECTOMY with Patch Angioplasty ;  Surgeon: Rosetta Posner, MD;  Location: [place];  Service: Vascular;  Laterality: Right;   ENDARTERECTOMY Left 05/18/2021   Procedure: LEFT CAROTID ARTERY ENDARTERECTOMY  with patch angioplasty;  Surgeon: Rosetta Posner, MD;  Location: [place];  Service: Vascular;  Laterality: Left;   EYE SURGERY     bilateral cataract   KELOID EXCISION  04/2008   on chest scar; Dr. Dessie Coma   KELOID EXCISION     PILONIDAL CYST EXCISION  1989   Patient Active Problem List   Diagnosis Date Noted   Encounter for annual general medical examination with abnormal findings in adult 07/10/2022   Depression 03/22/2022   Stroke-like symptoms 03/01/2022   History of CVA (cerebrovascular accident) 02/28/2022   Aspiration pneumonia vs. 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   Carotid artery disease ([place]) 07/01/2012   Special screening for malignant neoplasm of prostate 05/08/2011   Vitamin D deficiency 11/20/2009   Transient cerebral ischemia 06/30/2009   CEREBROVASCULAR DISEASE 06/30/2009   COLONIC POLYPS 01/10/2008   Essential hypertension 01/10/2008   Coronary atherosclerosis 01/10/2008   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   GYNECOMASTIA, UNILATERAL 01/10/2008   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   SHOULDER PAIN 01/10/2008   Type 2 diabetes mellitus ([place]) 01/10/2008    REFERRING DIAG:  Z61.09 (ICD-10-CM) - History of CVA (cerebrovascular accident) I63.81 (ICD-10-CM) - Right  thalamic infarction ([place])    THERAPY DIAG:  Unsteadiness on feet  Other abnormalities of gait and mobility  Muscle weakness (generalized)  PERTINENT HISTORY: HTN, HLD, DMII, CAD, carotid artery disease s/p R CEA and history of 2 strokes and 1 TIA   PRECAUTIONS: Fall, Liquids: Honey thick, heart monitor   SUBJECTIVE: Pt presents with daughter. Brought exercise log.   PAIN:  Are you having pain? No  There were no vitals filed for this visit.     OBJECTIVE: (objective measures completed at initial evaluation unless otherwise dated)    TODAY'S TREATMENT:   3  bouts of following exercises: - gait training: 1 x 345' with RW, cues to kick the band to improve step length, look down, and pay attention on task of walking - sit to stand with bil OH reach: 2 x 5 - toe touches: 10x     PATIENT EDUCATION: Education details: See self-care section  Person educated: Patient and Spouse Education method: Explanation  Education comprehension: verbalized understanding and needs further education      HOME EXERCISE PROGRAM: Access Code: [email or web address] URL: [email or web address] Date: 07/06/2022 Prepared by: Mickie Bail Plaster  Exercises - Sit to Stand Without Arm Support  - 1 x daily - 7 x weekly - 3 sets - 5-7 reps - Heel Raise on Step  - 1 x daily - 7 x weekly - 3 sets - 10 reps - Forward Step Down with Heel Tap and Rail Support  - 1 x daily - 7 x weekly - 3 sets - 10 reps - Sit to Stand with Resistance Around Legs  - 1 x daily - 7 x weekly - 3 sets - 10 reps      GOALS: Goals reviewed with patient? Yes   SHORT TERM GOALS: Target date: 05/30/2022   Patient will demo 0.59ms walking speed with walker to improve gait speed and community ambulation Baseline: 0.50 m/s with walker; 0.529m with walker(06/01/22); 0.59 m/s (06/22/22)  Goal status: not met   2.  Patient will be able to perform sit to stand without HHA to improve functional strength Baseline: Requires 1-2 HHA  from bed or chair to stand up (eval); able to do 2 reps without HHA but requires multiple attempt to stand up Goal status: Goal met.   3.  Patient will report 50% compliance with HEP to improve self management of symptoms Baseline: HEP to be issued 2nd visit Goal status: goal met.     LONG TERM GOALS: Target date: 08/03/2022  Patient will demo gait speed of >0.74m7mwith LRAD to improve community ambulation and decrease fall risk Baseline: 0.5 m/s with walker; 0.55 m/s (06/01/22); 0.59 m/s (06/22/22) Goal status: progressing   2.  Pt will demo BBS of >50/56 to reduce fall risk Baseline: 42/56 (05/02/22); 50/56 (06/08/22) Goal status: goal met   3.  Patient will demo TUG <  15 seconds with LRAD to improve functional mobility Baseline: 21 sec with RW; 26 sec with RW (06/01/22); 23 sec with st. Cane (06/02/22); 18.85 sec with st. Cane but requires CGA for safety (06/22/22) Goal status: Progressing    4. Patient will report 10 points of improvement on FOTO to improve overall function Baseline: 54 (06/08/22) Goal status: progressing   ASSESSMENT:   CLINICAL IMPRESSION:  Pt is demo. Slow improvement in his compliance with HEP. He was able to improve step length at least 60% better compared to last session.  Education regarding importance of consistency with walking at home outside of therapy to see progress. Utilized treadmill for gait training during session with focus on improving gait kinematics and increasing safety awareness during gait. Pt continues to exhibit impaired endurance limiting his ability to participate in functional gait training for extended periods of time (longer than 1-2 min).    OBJECTIVE IMPAIRMENTS Abnormal gait, decreased activity tolerance, decreased balance, decreased endurance, decreased mobility, difficulty walking, decreased ROM, decreased strength, impaired flexibility, improper body mechanics, and postural dysfunction.    ACTIVITY LIMITATIONS cleaning, community activity,  and driving.    PERSONAL FACTORS Age, Past/current experiences, Time since onset of injury/illness/exacerbation, and 1-2 comorbidities: TN, HLD, DMII, CAD, carotid artery disease s/p R CEA and history of multiple strokes  are also affecting patient's functional outcome.      REHAB POTENTIAL: Good   CLINICAL DECISION MAKING: Stable/uncomplicated   EVALUATION COMPLEXITY: Low   PLAN: PT FREQUENCY: 2x/week   PT DURATION: 6 weeks   PLANNED INTERVENTIONS: Therapeutic exercises, Therapeutic activity, Neuromuscular re-education, Balance training, Gait training, Patient/Family education, Joint mobilization, Stair training, Orthotic/Fit training, Cryotherapy, Moist heat, and Manual therapy   PLAN FOR NEXT SESSION: Treadmill interval training, referral for new AFO obtained but therapist did not send it off, continue to work on step length, Continue to work on [employer], gait training, balance, hip strengthening   [name], PT 07/25/2022, 2:46 PM

## 2022-07-25 NOTE — Patient Instructions (Signed)
Chin Tuck Against Resistance   Roll up a towel and place under your chin   Towel should take up space between chest and chin  Press down, using muscles in front of neck.   Do 10 pulses (nodding up and down) then hold for 10 seconds

## 2022-07-25 NOTE — Therapy (Addendum)
OUTPATIENT SPEECH LANGUAGE PATHOLOGY DYSPHAGIA TREATMENT NOTE (DISCHARGE)    Patient Name: [name] [name] MRN: [id number] DOB:May 15, 1941, 81 y.o., male Today's Date: 07/25/2022  PCP: Pleas Koch, NP  REFERRING PROVIDER: Pleas Koch, NP   END OF SESSION:   End of Session - 07/25/22 1312     Visit Number 17    Number of Visits 19   +2 visits for recertification   Date for SLP Re-Evaluation 08/08/22   +4 weeks for recert   Authorization Type [employer]    Progress Note Due on Visit 20    SLP Start Time 1315    SLP Stop Time  1400    SLP Time Calculation (min) 45 min    Activity Tolerance Patient tolerated treatment well                    Past Medical History:  Diagnosis Date   Basal cell carcinoma 11/09/2019   nod & infil-behind right ear-cx3 &exc   Basal cell carcinoma 03/21/2020   Residual BCC with peripheral margin involved - ST recommends [facility]   Carotid artery occlusion    Coronary artery disease    s/p CABG 2005; sees Dr Johnsie Cancel yearly   Diabetes mellitus without complication ([place])    Gynecomastia    History of COVID-19 01/08/2022   Hypercholesterolemia    Hypertension    Neurodermatitis    Overweight(278.02)    Personal history of colonic polyps 10/08/2006   tubular adenomas   Renal insufficiency    Stroke [facility])    TIA  April 2022   Transient ischemic attack 2010   "lasted ~ 5 seconds"   Vitamin D deficiency    Past Surgical History:  Procedure Laterality Date   CARDIAC CATHETERIZATION  02/2004   "tried to stent; couldn't"   CAROTID ENDARTERECTOMY Right 08/26/2015   COLONOSCOPY     CORONARY ANGIOPLASTY     CORONARY ARTERY BYPASS GRAFT  Feb. 2005   4 vessel   ENDARTERECTOMY Right 08/26/2015   Procedure: Right Carotid ENDARTERECTOMY with Patch Angioplasty ;  Surgeon: Rosetta Posner, MD;  Location: [place];  Service: Vascular;  Laterality: Right;   ENDARTERECTOMY Left 05/18/2021   Procedure: LEFT CAROTID ARTERY ENDARTERECTOMY  with  patch angioplasty;  Surgeon: Rosetta Posner, MD;  Location: [place];  Service: Vascular;  Laterality: Left;   EYE SURGERY     bilateral cataract   KELOID EXCISION  04/2008   on chest scar; Dr. Dessie Coma   KELOID EXCISION     PILONIDAL CYST EXCISION  1989   Patient Active Problem List   Diagnosis Date Noted   Encounter for annual general medical examination with abnormal findings in adult 07/10/2022   Depression 03/22/2022   Stroke-like symptoms 03/01/2022   History of CVA (cerebrovascular accident) 02/28/2022   Aspiration pneumonia vs. 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   TIA (transient ischemic attack) 04/03/2021   Lower extremity edema 08/13/2019   Unsteady gait 06/09/2019   Trigger ring finger of right hand 06/02/2018   Ceruminosis, bilateral 09/16/2017   Hyperlipidemia 01/30/2016   Carotid stenosis 08/26/2015   Anxiety, 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   Carotid artery disease ([place]) 07/01/2012   Special screening for malignant neoplasm of prostate 05/08/2011   Vitamin D deficiency 11/20/2009   Transient cerebral ischemia 06/30/2009   CEREBROVASCULAR DISEASE 06/30/2009   COLONIC POLYPS  01/10/2008   Essential hypertension 01/10/2008   Coronary atherosclerosis 01/10/2008   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   GYNECOMASTIA, UNILATERAL 01/10/2008   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   SHOULDER PAIN 01/10/2008   Type 2 diabetes mellitus ([place]) 01/10/2008    ONSET DATE: 02/28/2022    REFERRING DIAG: C37.62 (ICD-10-CM) - History of CVA (cerebrovascular accident) R13.10 (ICD-10-CM) - Dysphagia, unspecified type J69.0 (ICD-10-CM) - Aspiration pneumonia, unspecified aspiration pneumonia type, unspecified laterality, unspecified part of lung ([place])   THERAPY DIAG: Dysphagia, unspecified type  SUBJECTIVE:   SUBJECTIVE STATEMENT: "I started having  my coffee"   PAIN:  Are you having pain? No  SPEECH THERAPY DISCHARGE SUMMARY  Visits from Start of Care: 17  Current functional level related to goals / functional outcomes: Mod-I to requiring occasional min-A for adherence to swallow compensations. Mod-I for teach back of dysphagia exercises and consistent compliance with prescribed HEP. Spouse able to teach back compensations and strategies to increase swallow safety with thin liquids at home, should they choose to administer them. Spouse and pt endorse understanding of swallow impairment and recommendation for continuation of dysphagia exercises with f/u in 6 months to re-assess swallow function.    Remaining deficits: See MBSS for report of swallow impairments   Education / Equipment: Dysphagia exercises, visual and verbal cueing, swallowing compensations/strategies, aspiration precautions    Patient agrees to discharge. Patient goals were partially met. Patient is being discharged due to maximized rehab potential.    OBJECTIVE:   RECOMMENDATIONS FROM OBJECTIVE SWALLOW STUDY (MBSS/FEES):  regular diet, honey tick liquids Objective swallow impairments: Pt presents with persistent dysphagia, but with mild improvements noted since previous MBS in March 2023. [place] deficits still include reduced posterior propulsion with spill into the pharynx as well as delayed swallow initiation with invasion into the airway when boluses reach the pyriform sinuses before the swallow is initiated. This happened with thin liquids when he would elevate his head in between sips. His airway protection still remains optimal with thin liquids when using a chin tuck and head turn L on single sips, which pt can perform during this study but reportedly is having trouble with implementation/carryover per wife. Note that he kept his chin in a tucked position best when using a straw so that he could maintain that position across trials. He still had silent  aspiration of nectar thick liquids but it does not appear to occur with the same frequency as it did on previous MBS. Airway protection is variable with nectar thick liquids though, ranging from no airway invasion with sips that are well contained within the valleculae, to frank penetration to the vocal folds, to silent aspiration, and a cued cough does not clear penetrates or aspirates. Honey thick liquids and solids are still best contained above the valleculae and therefore consumed without any aspiration. Objective recommended compensations: cup, no straw; meds with puree, small bite/sips, slow rate  TODAY'S TREATMENT:   DYSPHAGIA TREATMENT:   Current diet: regular and honey thick liquids Swallow precautions: small bites, small sips, alternate bites and sips, chin tuck, head turn, stay upright 30 minutes post meal, hard swallow, and avoid mixed consistencies Patient directly observed with POs: No  Feeding:  n/a Liquids provided by:  n.a   Oral phase signs and symptoms:  n/a Pharyngeal phase signs and symptoms:  n/a   Therapeutic exercises: Effortful Swallow, Masako, and EMST Types of cueing: verbal Amount of cueing: modified independent  Treatment comments: Pt teaches back purpose of dysphagia exercise  regimen, verbalizes his personal goals related to swallow. Pt demonstrates all exercises x5 with mod-I following teach back. Verbalizes risks of aspiration. Pt reports has implemented thin liquids with AM coffee with some coughing exhibited. Is using chin tuck majority of the time and receive biofeedback (coughing) reminding to use strategy. Wife is verbally cueing pt to use chin tuck; SLP provided visual aid to be used at home where pt has coffee.   HOME EXERCISE PROGRAM: Exercises: Effortful Swallow 40 repetitions, 2 x/day, Masako 20 repetitions, 2 x/day, and EMST 20 repetitions, 2 x/day  PATIENT EDUCATION: Education details: see above Person educated: Patient and Spouse Education method:  Explanation, Demonstration, Verbal cues, and Handouts Education comprehension: verbalized understanding, returned demonstration, and needs further education     ASSESSMENT:   CLINICAL IMPRESSION: Patient is a 81 y.o. M receiving skilled ST targeting orapharyngeal dysphagia. Repeat instrumental swallow study demonstrates mild improvement in swallow function, with ongoing deficits. Demonstrates penetration and aspiration with thin and nectar thick liquids. Impairments in swallow include reduced posterior bolus propulsion, reduced bolus cohesion, and a pharyngeal delay. Chin tuck and head turn continue to be optimal means of airway protection. Wife, [name], is thickening all liquids for pt, exception of coffee. With thin liquid intake, report adherence to recommendations which have been made over therapy course. Ongoing education and training of dysphagia exercises completed, with variable compliance and subjective reported improvement in swallow function at home. SLP has faciliated patient and spouse making educated decision regarding ongoing PO diet at home. Pt to continue with thickened liquids. Pt and spouse agreeable to d/c and understand requirement to seek referral for additional dysphagia assessment or therapy.   OBJECTIVE IMPAIRMENTS include dysarthria and dysphagia. These impairments are limiting patient from safety when swallowing and effectively communicating at home and in community. Factors affecting potential to achieve goals and functional outcome are ability to learn/carryover information and previous level of function. Patient will benefit from skilled SLP services to address above impairments and improve overall function.    REHAB POTENTIAL: Fair       GOALS: Goals reviewed with patient? Yes   SHORT TERM GOALS: Target date: 06/14/2022     Pt and wife will report compliance with HEP frequency and repetitions with mod-I over 2 sessions. Baseline: 06-08-22, 06-13-22 Goal status:  partially met   2.  Pt and wife will report implementation of visual aids at home to support pt recall of targeted swallowing compensations and strategies over 2 sessions.  Baseline: 06-13-22 Goal status: not met   3.  Pt will teach back prescribed dysphagia exercises with rare min-A over 2 sessions Baseline: 06-08-22, 06-13-22 Goal status: met     LONG TERM GOALS: Target date: 08/08/2022 (+4 weeks for recertification)   Pt and spouse will report ongoing compliance with prescribed HEP with mod-I over 1 week period Baseline: 07/06/22, 07-11-22 Goal status: met   2.  Using visual aids, pt with comply with targeted swallow precautions and compensations with no more than 2 verbal cues over 10 minute period.  Baseline: 07-11-2022 Goal status: met   3.  Pt will improve score on EAT-10 PROM by d/c, indicating subjective improvement in swallow function Baseline: -26-23 EAT-10: 2 Goal status: ongoing   4.  Pt will participate in follow up MBSS following therapy course to objectively assess improvement in swallow function and safety.  Baseline: 07/06/22 Goal status: Met  5.  Pt and spouse will report adherence to updated diet and swallow compensations with mod-I over 1  week period.   Baseline: 07-25-22  Goal status: Met  6.  Pt and spouse will report compliance with update HEP with mod-I over 1 week period  Baseline: Met  Goal status: Met  7.  Pt and spouse will report successful implementation of agreed upon visual aids at home to support compliance with swallow compensations and strategies.   Baseline: 07-25-22  Goal status: not met  8.  Pt will demonstrate targeted swallow compensations/strategies and HEP exercises with rare min-A to ensure understanding of targeted interventions.   Baseline: 07-25-22  Goal status: met     PLAN: SLP FREQUENCY: 1x/week (decreased frequency at recertification)   SLP DURATION: 12 weeks (+4 weeks for recertification)   PLANNED INTERVENTIONS: Aspiration  precaution training, Pharyngeal strengthening exercises, Diet toleration management , Trials of upgraded texture/liquids, SLP instruction and feedback, Compensatory strategies, Patient/family education, and Re-evaluation   Su Monks, [place] 07/25/2022, 1:12 PM

## 2022-07-27 ENCOUNTER — Encounter: Payer: Medicare PPO | Admitting: Speech Pathology

## 2022-07-27 ENCOUNTER — Ambulatory Visit: Payer: Medicare PPO

## 2022-07-27 DIAGNOSIS — R1312 Dysphagia, oropharyngeal phase: Secondary | ICD-10-CM | POA: Diagnosis not present

## 2022-07-27 DIAGNOSIS — R2681 Unsteadiness on feet: Secondary | ICD-10-CM | POA: Diagnosis not present

## 2022-07-27 DIAGNOSIS — R2689 Other abnormalities of gait and mobility: Secondary | ICD-10-CM | POA: Diagnosis not present

## 2022-07-27 DIAGNOSIS — M6281 Muscle weakness (generalized): Secondary | ICD-10-CM

## 2022-07-27 DIAGNOSIS — R1310 Dysphagia, unspecified: Secondary | ICD-10-CM | POA: Diagnosis not present

## 2022-07-27 NOTE — Therapy (Signed)
OUTPATIENT PHYSICAL THERAPY TREATMENT NOTE-   Patient Name: [name] [name] MRN: [id number] DOB:06-01-1941, 81 y.o., male Today's Date: 07/27/2022  PCP: Pleas Koch, NP, Josue Hector, MD REFERRING PROVIDER: Pleas Koch, NP    Physical Therapy Progress Note   Dates of Reporting Period:05/02/2022 - 07/18/2022  See Note below for Objective Data and Assessment of Progress/Goals.  Thank you for the referral of this patient. Mickie Bail Plaster, PT, DPT   END OF SESSION:   PT End of Session - 07/27/22 1413     Visit Number 23    Number of Visits 26    Date for PT Re-Evaluation 08/03/22    Authorization Type [employer] - will need auth    Progress Note Due on Visit 24    Equipment Utilized During Treatment Gait belt    Activity Tolerance Patient tolerated treatment well    Behavior During Therapy [facility] for tasks assessed/performed                  Past Medical History:  Diagnosis Date   Basal cell carcinoma 11/09/2019   nod & infil-behind right ear-cx3 &exc   Basal cell carcinoma 03/21/2020   Residual BCC with peripheral margin involved - ST recommends MOHs   Carotid artery occlusion    Coronary artery disease    s/p CABG 2005; sees Dr Johnsie Cancel yearly   Diabetes mellitus without complication ([place])    Gynecomastia    History of COVID-19 01/08/2022   Hypercholesterolemia    Hypertension    Neurodermatitis    Overweight(278.02)    Personal history of colonic polyps 10/08/2006   tubular adenomas   Renal insufficiency    Stroke [facility])    TIA  April 2022   Transient ischemic attack 2010   "lasted ~ 5 seconds"   Vitamin D deficiency    Past Surgical History:  Procedure Laterality Date   CARDIAC CATHETERIZATION  02/2004   "tried to stent; couldn't"   CAROTID ENDARTERECTOMY Right 08/26/2015   COLONOSCOPY     CORONARY ANGIOPLASTY     CORONARY ARTERY BYPASS GRAFT  Feb. 2005   4 vessel   ENDARTERECTOMY Right 08/26/2015   Procedure: Right Carotid  ENDARTERECTOMY with Patch Angioplasty ;  Surgeon: Rosetta Posner, MD;  Location: [place];  Service: Vascular;  Laterality: Right;   ENDARTERECTOMY Left 05/18/2021   Procedure: LEFT CAROTID ARTERY ENDARTERECTOMY  with patch angioplasty;  Surgeon: Rosetta Posner, MD;  Location: [place];  Service: Vascular;  Laterality: Left;   EYE SURGERY     bilateral cataract   KELOID EXCISION  04/2008   on chest scar; Dr. Dessie Coma   KELOID EXCISION     PILONIDAL CYST EXCISION  1989   Patient Active Problem List   Diagnosis Date Noted   Encounter for annual general medical examination with abnormal findings in adult 07/10/2022   Depression 03/22/2022   Stroke-like symptoms 03/01/2022   History of CVA (cerebrovascular accident) 02/28/2022   Aspiration pneumonia vs. 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   Carotid artery disease ([place]) 07/01/2012   Special screening for  malignant neoplasm of prostate 05/08/2011   Vitamin D deficiency 11/20/2009   Transient cerebral ischemia 06/30/2009   CEREBROVASCULAR DISEASE 06/30/2009   COLONIC POLYPS 01/10/2008   Essential hypertension 01/10/2008   Coronary atherosclerosis 01/10/2008   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   GYNECOMASTIA, UNILATERAL 01/10/2008   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   SHOULDER PAIN 01/10/2008   Type 2 diabetes mellitus ([place]) 01/10/2008    REFERRING DIAG:  N23.55 (ICD-10-CM) - History of CVA (cerebrovascular accident) I63.81 (ICD-10-CM) - 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 He was tired after last session.  PAIN:  Are you having pain? No  There were no vitals filed for this visit.     OBJECTIVE: (objective measures completed at initial evaluation unless otherwise dated)    TODAY'S TREATMENT:   3  bouts of following exercises: - gait training: 1 x 460' with RW, cues to kick the band to improve step length, look down, and pay attention on task of walking - sit to stand with bil OH reach: 10x - toe touches: 10x     PATIENT EDUCATION: Education details: See self-care section  Person educated: Patient and Spouse Education method: Explanation  Education comprehension: verbalized understanding and needs further education      HOME EXERCISE PROGRAM: Access Code: [email or web address] URL: [email or web address] Date: 07/06/2022 Prepared by: Mickie Bail Plaster  Exercises - Sit to Stand Without Arm Support  - 1 x daily - 7 x weekly - 3 sets - 5-7 reps - Heel Raise on Step  - 1 x daily - 7 x weekly - 3 sets - 10 reps - Forward Step Down with Heel Tap and Rail Support  - 1 x daily - 7 x weekly - 3 sets - 10 reps - Sit to Stand with Resistance Around Legs  - 1 x daily - 7 x weekly - 3 sets - 10 reps      GOALS: Goals reviewed with patient? Yes   SHORT TERM GOALS: Target date: 05/30/2022   Patient will demo 0.26ms walking speed with walker to improve gait speed and community ambulation Baseline: 0.50 m/s with walker; 0.5636m with walker(06/01/22); 0.59 m/s (06/22/22)  Goal status: not met   2.  Patient will be able to perform sit to stand without HHA to improve functional strength Baseline: Requires 1-2 HHA from bed or chair to  stand up (eval); able to do 2 reps without HHA but requires multiple attempt to stand up Goal status: Goal met.   3.  Patient will report 50% compliance with HEP to improve self management of symptoms Baseline: HEP to be issued 2nd visit Goal status: goal met.     LONG TERM GOALS: Target date: 08/03/2022  Patient will demo gait speed of >0.36m66mwith LRAD to improve community ambulation and decrease fall risk Baseline: 0.5 m/s with walker; 0.55 m/s (06/01/22); 0.59 m/s (06/22/22), 0.2m336mith walker (07/27/22) Goal status: not met   2.  Pt will demo BBS of >50/56 to reduce fall risk Baseline: 42/56 (05/02/22); 50/56 (06/08/22) Goal status: goal met   3.  Patient will demo TUG <15 seconds with LRAD to improve functional mobility  Baseline: 21 sec with RW; 26 sec with RW (06/01/22); 23 sec with st. Cane (06/02/22); 18.85 sec with st. Cane but requires CGA for safety (06/22/22); 21 sec (07/27/22) Goal status: Not met    4. Patient will report 10 points of improvement on FOTO to improve overall function Baseline: 54 (06/08/22); 49 (07/27/22) Goal status: progressing   ASSESSMENT:   CLINICAL IMPRESSION:  Pt has been seen for total of 23 sessions from 05/02/22 to 07/27/22 for gait and mobility disorder after recent stroke. Patient has currently reached maximum potential in therapy and will be discharged from skilled PT with independent home exercise program.  Education regarding importance of consistency with walking at home outside of therapy to see progress. Utilized treadmill for gait training during session with focus on improving gait kinematics and increasing safety awareness during gait. Pt continues to exhibit impaired endurance limiting his ability to participate in functional gait training for extended periods of time (longer than 1-2 min).    OBJECTIVE IMPAIRMENTS Abnormal gait, decreased activity tolerance, decreased balance, decreased endurance, decreased mobility, difficulty walking, decreased  ROM, decreased strength, impaired flexibility, improper body mechanics, and postural dysfunction.    ACTIVITY LIMITATIONS cleaning, community activity, and driving.    PERSONAL FACTORS Age, Past/current experiences, Time since onset of injury/illness/exacerbation, and 1-2 comorbidities: TN, HLD, DMII, CAD, carotid artery disease s/p R CEA and history of multiple strokes  are also affecting patient's functional outcome.      REHAB POTENTIAL: Good   CLINICAL DECISION MAKING: Stable/uncomplicated   EVALUATION COMPLEXITY: Low   PLAN: PT FREQUENCY: 2x/week   PT DURATION: 6 weeks   PLANNED INTERVENTIONS: Therapeutic exercises, Therapeutic activity, Neuromuscular re-education, Balance training, Gait training, Patient/Family education, Joint mobilization, Stair training, Orthotic/Fit training, Cryotherapy, Moist heat, and Manual therapy   PLAN FOR NEXT SESSION: Treadmill interval training, referral for new AFO obtained but therapist did not send it off, continue to work on step length, Continue to work on [employer], gait training, balance, hip strengthening   [name], PT 07/27/2022, 3:05 PM

## 2022-08-02 ENCOUNTER — Telehealth: Payer: Self-pay | Admitting: Primary Care

## 2022-08-02 NOTE — Telephone Encounter (Signed)
Spoke with pt's wife, [name] (on dpr), relaying Kate's message.  She verbalizes understanding.  However, pt is getting weaker and more fatigued to where he cannot do his exercises.  Also, c/o bilateral foot swelling. These sxs started about 2 wks ago and seem to be worsening.  Pt [and family] want to stop med and asking the best way to do that.  Plz advise.

## 2022-08-02 NOTE — Telephone Encounter (Signed)
Please notify family that since he is on such a low-dose and has only been taking it for 3 weeks he can just stop the Zoloft medication.

## 2022-08-02 NOTE — Telephone Encounter (Signed)
As discussed during his visit it can take up to 6 weeks for a medication to become effective.  I recommend he continue Zoloft for another 3 weeks.

## 2022-08-02 NOTE — Telephone Encounter (Signed)
Patients wife called and she said he has been on zoloft for 3 weeks, She said its not helping and he has been struggling to sleep, and because of the lack of sleep he has been tired and weak. She wants to to take him off of it but said she knows your not supposed to just stop and wanted some advice on what to do. Callback is [phone]. She said shes not going to give him one today

## 2022-08-03 NOTE — Telephone Encounter (Signed)
Patients wife called back and I made her aware of Kate's message bellow and told her to call back if any issues come up. She didn't have any further questions

## 2022-08-10 ENCOUNTER — Other Ambulatory Visit: Payer: Self-pay | Admitting: Primary Care

## 2022-08-10 DIAGNOSIS — E7849 Other hyperlipidemia: Secondary | ICD-10-CM

## 2022-08-15 ENCOUNTER — Ambulatory Visit (INDEPENDENT_AMBULATORY_CARE_PROVIDER_SITE_OTHER): Payer: Medicare PPO | Admitting: Podiatry

## 2022-08-15 ENCOUNTER — Encounter: Payer: Self-pay | Admitting: Podiatry

## 2022-08-15 DIAGNOSIS — B351 Tinea unguium: Secondary | ICD-10-CM | POA: Diagnosis not present

## 2022-08-15 DIAGNOSIS — D689 Coagulation defect, unspecified: Secondary | ICD-10-CM

## 2022-08-15 DIAGNOSIS — M79675 Pain in left toe(s): Secondary | ICD-10-CM | POA: Diagnosis not present

## 2022-08-15 DIAGNOSIS — M79674 Pain in right toe(s): Secondary | ICD-10-CM | POA: Diagnosis not present

## 2022-08-15 NOTE — Progress Notes (Signed)
This patient presents  to my office for at risk foot care.  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 Patient has history of CVA. This patient  takes plavix.This patient is unable to cut nails himself since the patient cannot reach his nails.These nails are painful walking and wearing shoes.  This patient presents for at risk foot care today.  General Appearance  Alert, conversant and in no acute stress.  Vascular  Dorsalis pedis and posterior tibial  pulses are weakly  palpable  bilaterally.  Capillary return is within normal limits  bilaterally. Temperature is within normal limits  bilaterally. Swelling persists in feet.   Neurologic  Senn-Weinstein monofilament wire test within normal limits  bilaterally. Muscle power within normal limits bilaterally.  Nails Thick disfigured discolored nails with subungual debris  from hallux to fifth toes bilaterally. No evidence of bacterial infection or drainage bilaterally.  Orthopedic  No limitations of motion  feet .  No crepitus or effusions noted.  No bony pathology or digital deformities noted.  Skin  normotropic skin with no porokeratosis noted bilaterally.  No signs of infections or ulcers noted.     Onychomycosis  Pain in right toes  Pain in left toes  Consent was obtained for treatment procedures.  Mechanical debridement of nails 1-5  bilaterally performed with a nail nipper.  Filed with dremel without incident.    Return office visit    3 months                  Told patient to return for periodic foot care and evaluation due to potential at risk complications.   Casha Estupinan DPM   

## 2022-08-17 DIAGNOSIS — H903 Sensorineural hearing loss, bilateral: Secondary | ICD-10-CM | POA: Diagnosis not present

## 2022-08-21 ENCOUNTER — Emergency Department (HOSPITAL_COMMUNITY)
Admission: EM | Admit: 2022-08-21 | Discharge: 2022-08-21 | Disposition: A | Discharge status: Home / Self Care | Payer: Medicare PPO | Attending: Emergency Medicine | Admitting: Emergency Medicine

## 2022-08-21 ENCOUNTER — Emergency Department (HOSPITAL_COMMUNITY): Payer: Medicare PPO

## 2022-08-21 ENCOUNTER — Ambulatory Visit: Payer: Medicare PPO | Admitting: Primary Care

## 2022-08-21 ENCOUNTER — Other Ambulatory Visit: Payer: Self-pay

## 2022-08-21 DIAGNOSIS — I2510 Atherosclerotic heart disease of native coronary artery without angina pectoris: Secondary | ICD-10-CM | POA: Diagnosis not present

## 2022-08-21 DIAGNOSIS — Z8673 Personal history of transient ischemic attack (TIA), and cerebral infarction without residual deficits: Secondary | ICD-10-CM | POA: Insufficient documentation

## 2022-08-21 DIAGNOSIS — E119 Type 2 diabetes mellitus without complications: Secondary | ICD-10-CM | POA: Insufficient documentation

## 2022-08-21 DIAGNOSIS — R4701 Aphasia: Secondary | ICD-10-CM | POA: Diagnosis not present

## 2022-08-21 DIAGNOSIS — I6381 Other cerebral infarction due to occlusion or stenosis of small artery: Secondary | ICD-10-CM | POA: Diagnosis not present

## 2022-08-21 DIAGNOSIS — Z7902 Long term (current) use of antithrombotics/antiplatelets: Secondary | ICD-10-CM | POA: Diagnosis not present

## 2022-08-21 DIAGNOSIS — I10 Essential (primary) hypertension: Secondary | ICD-10-CM | POA: Insufficient documentation

## 2022-08-21 DIAGNOSIS — Z79899 Other long term (current) drug therapy: Secondary | ICD-10-CM | POA: Insufficient documentation

## 2022-08-21 DIAGNOSIS — G319 Degenerative disease of nervous system, unspecified: Secondary | ICD-10-CM | POA: Diagnosis not present

## 2022-08-21 LAB — CBC WITH DIFFERENTIAL/PLATELET
Abs Immature Granulocytes: 0.03 10*3/uL (ref 0.00–0.07)
Basophils Absolute: 0 10*3/uL (ref 0.0–0.1)
Basophils Relative: 1 %
Eosinophils Absolute: 0.1 10*3/uL (ref 0.0–0.5)
Eosinophils Relative: 2 %
HCT: 41.4 % (ref 39.0–52.0)
Hemoglobin: 14.1 g/dL (ref 13.0–17.0)
Immature Granulocytes: 1 %
Lymphocytes Relative: 21 %
Lymphs Abs: 1.1 10*3/uL (ref 0.7–4.0)
MCH: 34.7 pg — ABNORMAL HIGH (ref 26.0–34.0)
MCHC: 34.1 g/dL (ref 30.0–36.0)
MCV: 102 fL — ABNORMAL HIGH (ref 80.0–100.0)
Monocytes Absolute: 0.6 10*3/uL (ref 0.1–1.0)
Monocytes Relative: 10 %
Neutro Abs: 3.5 10*3/uL (ref 1.7–7.7)
Neutrophils Relative %: 65 %
Platelets: 177 10*3/uL (ref 150–400)
RBC: 4.06 MIL/uL — ABNORMAL LOW (ref 4.22–5.81)
RDW: 13.5 % (ref 11.5–15.5)
WBC: 5.3 10*3/uL (ref 4.0–10.5)
nRBC: 0 % (ref 0.0–0.2)

## 2022-08-21 LAB — COMPREHENSIVE METABOLIC PANEL
ALT: 26 U/L (ref 0–44)
AST: 29 U/L (ref 15–41)
Albumin: 3.2 g/dL — ABNORMAL LOW (ref 3.5–5.0)
Alkaline Phosphatase: 81 U/L (ref 38–126)
Anion gap: 9 (ref 5–15)
BUN: 26 mg/dL — ABNORMAL HIGH (ref 8–23)
CO2: 25 mmol/L (ref 22–32)
Calcium: 8.5 mg/dL — ABNORMAL LOW (ref 8.9–10.3)
Chloride: 105 mmol/L (ref 98–111)
Creatinine, Ser: 1.04 mg/dL (ref 0.61–1.24)
GFR, Estimated: >60 mL/min (ref >60)
Glucose, Bld: 154 mg/dL — ABNORMAL HIGH (ref 70–99)
Potassium: 4.1 mmol/L (ref 3.5–5.1)
Sodium: 139 mmol/L (ref 135–145)
Total Bilirubin: 1 mg/dL (ref 0.3–1.2)
Total Protein: 5.8 g/dL — ABNORMAL LOW (ref 6.5–8.1)

## 2022-08-21 LAB — URINALYSIS, ROUTINE W REFLEX MICROSCOPIC
Bilirubin Urine: NEGATIVE
Glucose, UA: NEGATIVE mg/dL
Hgb urine dipstick: NEGATIVE
Ketones, ur: NEGATIVE mg/dL
Leukocytes,Ua: NEGATIVE
Nitrite: NEGATIVE
Protein, ur: NEGATIVE mg/dL
Specific Gravity, Urine: 1.016 (ref 1.005–1.030)
pH: 5 (ref 5.0–8.0)

## 2022-08-21 LAB — TROPONIN I (HIGH SENSITIVITY)
Troponin I (High Sensitivity): 6 ng/L (ref <18)
Troponin I (High Sensitivity): 7 ng/L (ref <18)

## 2022-08-21 LAB — PROTIME-INR
INR: 1 (ref 0.8–1.2)
Prothrombin Time: 13.4 seconds (ref 11.4–15.2)

## 2022-08-21 MED ORDER — DIAZEPAM 5 MG/ML IJ SOLN
2.5000 mg | Freq: Once | INTRAMUSCULAR | Status: AC
  Administered 2022-08-21: 2.5 mg via INTRAVENOUS
  Filled 2022-08-21: qty 2

## 2022-08-21 NOTE — ED Notes (Signed)
Pt returns from ct scan. 

## 2022-08-21 NOTE — ED Notes (Signed)
Patient transported to MRI 

## 2022-08-21 NOTE — ED Provider Notes (Signed)
[facility] EMERGENCY DEPARTMENT Provider Note   CSN: [id number] Arrival date & time: 08/21/22  0840     History {Add pertinent medical, surgical, social history, OB history to HPI:1} Chief Complaint  Patient presents with   Aphasia    [name] [name] is a 81 y.o. male.  Patient presents to the hospital via EMS complaining of expressive aphasia.  Patient reportedly had difficulty putting words together this morning according to his wife.  He was using incorrect words while speaking.  His last known normal was last night at approximately 9:00.  He denies any weakness or other deficits but does endorse some confusion when speaking.  Patient is alert and oriented at this time.  He endorses no complaints at this time.  Denies headache, falls, head trauma, shortness of breath, chest pain, abdominal pain, dizziness, nausea.  Past medical history significant for stroke in March of this year with mild left-sided weakness.  Other past medical history significant for type 2 diabetes, coronary artery disease, hypertension, renal insufficiency, history of TIAs, carotid artery occlusion  HPI     Home Medications Prior to Admission medications   Medication Sig Start Date End Date Taking? Authorizing Provider  atorvastatin (LIPITOR) 20 MG tablet TAKE 1/2 TABLET (10 MG TOTAL) BY MOUTH DAILY. FOR CHOLESTEROL. 08/10/22   Pleas Koch, NP  cholecalciferol (VITAMIN D3) 25 MCG (1000 UNIT) tablet Take 1,000 Units by mouth daily.    [provider]  clopidogrel (PLAVIX) 75 MG tablet Take 1 tablet (75 mg total) by mouth daily. 04/19/22   Pleas Koch, NP  cyanocobalamin 1000 MCG tablet Take 1 tablet by mouth daily.    [provider]  glucose blood (ACCU-CHEK GUIDE) test strip USE AS DIRECTED TO TEST BLOOD SUGAR UP TO 4 TIMES DAILY 03/20/22   Pleas Koch, NP  hydrochlorothiazide (HYDRODIURIL) 12.5 MG tablet Take 12.5 mg by mouth daily. 03/23/22   [provider]  losartan (COZAAR) 25 MG tablet TAKE 2 TABLETS (50 MG TOTAL) BY MOUTH DAILY. FOR BLOOD PRESSURE. 05/29/22   Pleas Koch, NP  nitroGLYCERIN (NITROSTAT) 0.4 MG SL tablet Place 1 tablet (0.4 mg total) under the tongue every 5 (five) minutes as needed for chest pain. 07/14/21   Josue Hector, MD  sertraline (ZOLOFT) 25 MG tablet Take 1 tablet (25 mg total) by mouth daily. For anxiety and depression. 07/10/22   Pleas Koch, NP      Allergies    Niacin and Ativan [lorazepam]    Review of Systems   Review of Systems  Physical Exam Updated Vital Signs BP (!) 167/64 (BP Location: Right Arm)   Pulse 66   Temp (!) 97.3 F (36.3 C) (Oral)   SpO2 96%  Physical Exam  ED Results / Procedures / Treatments   Labs (all labs ordered are listed, but only abnormal results are displayed) Labs Reviewed - No data to display  EKG EKG Interpretation  Date/Time:  Tuesday August 21 2022 08:51:21 EDT Ventricular Rate:  68 PR Interval:  175 QRS Duration: 106 QT Interval:  410 QTC Calculation: 436 R Axis:   87 Text Interpretation: Sinus rhythm Borderline right axis deviation Borderline T wave abnormalities Confirmed by Georgina Snell [phone]) on 08/21/2022 8:54:36 AM  Radiology No results found.  Procedures Procedures  {Document cardiac monitor, telemetry assessment procedure when appropriate:1}  Medications Ordered in ED Medications - No data to display  ED Course/ Medical Decision Making/ A&P Clinical Course as of  08/21/22 0911  Tue Aug 21, 2022  0850 Patient with history of CVA/TIA, CAD on Plavix who presents after episode of word salad and expressive aphasia around 810 this morning which has since resolved.  He presents at 8:40 AM neurologically intact.  Plan on TIA work-up.  No code stroke called since no active focal neurologic deficits.  Unlikely dissection with no pulse deficits, well appearance and no active chest pain or reported pain. [VB]    Clinical  Course User Index [VB] Elgie Congo, MD                           Medical Decision Making Amount and/or Complexity of Data Reviewed Labs: ordered. Radiology: ordered.  Risk Prescription drug management.   Aphasia, aurora says dc okay, keep follow up  {Document critical care time when appropriate:1} {Document review of labs and clinical decision tools ie heart score, Chads2Vasc2 etc:1}  {Document your independent review of radiology images, and any outside records:1} {Document your discussion with family members, caretakers, and with consultants:1} {Document social determinants of health affecting pt's care:1} {Document your decision making why or why not admission, treatments were needed:1} Final Clinical Impression(s) / ED Diagnoses Final diagnoses:  None    Rx / DC Orders ED Discharge Orders     None

## 2022-08-21 NOTE — ED Triage Notes (Signed)
Pt arrived by [facility] from home. Pt difficulty speaking and forming sentences; symptoms resolved around 0810. LNW 2130 before bed. Pt arrived A/Ox4, answering questions appropriately. Pt taking plavix. Lside weak following previous stroke.   EMS VS: Bp 130/70, RR 16, HR 78, 98% RA, CBG 132

## 2022-08-21 NOTE — ED Notes (Signed)
Patient verbalizes understanding of discharge instructions. Opportunity for questioning and answers were provided. Pt discharged from ED. 

## 2022-08-21 NOTE — Discharge Instructions (Addendum)
You were seen today due to aphasia.  This is when you are unable to say the correct words this morning.  Neurology recommends follow-up at your scheduled appointment on September 7 with your neurologist.  If you develop any repeat symptoms I recommend reevaluation at the emergency department.

## 2022-08-28 ENCOUNTER — Ambulatory Visit: Payer: Medicare PPO | Admitting: Primary Care

## 2022-08-28 ENCOUNTER — Encounter: Payer: Self-pay | Admitting: Primary Care

## 2022-08-28 DIAGNOSIS — E7849 Other hyperlipidemia: Secondary | ICD-10-CM

## 2022-08-28 DIAGNOSIS — F329 Major depressive disorder, single episode, unspecified: Secondary | ICD-10-CM | POA: Diagnosis not present

## 2022-08-28 DIAGNOSIS — G459 Transient cerebral ischemic attack, unspecified: Secondary | ICD-10-CM | POA: Diagnosis not present

## 2022-08-28 DIAGNOSIS — F419 Anxiety disorder, unspecified: Secondary | ICD-10-CM

## 2022-08-28 MED ORDER — ATORVASTATIN CALCIUM 10 MG PO TABS: 10.0000 mg | ORAL_TABLET | Freq: Every day | ORAL | 1 refills | Status: DC

## 2022-08-28 NOTE — Patient Instructions (Signed)
I changed your atorvastatin (Cholesterol pill) to 10 mg so that you do not need to break the pill in half.  Follow up with Neurology as scheduled.  It was a pleasure to see you today!

## 2022-08-28 NOTE — Assessment & Plan Note (Signed)
Change pill form of atorvastatin from 20 mg to 10 mg so that he does not have to break his tablets in half. New prescription sent to pharmacy for atorvastatin 10 mg daily.

## 2022-08-28 NOTE — Progress Notes (Signed)
Subjective:    Patient ID: [name] [name], male    DOB: 27-Apr-1941, 81 y.o.   MRN: [id number]  HPI  [name] is a very pleasant 81 y.o. male with a history of hypertension, CVA, TIA, type 2 diabetes, carotid artery disease, coronary atherosclerosis, venous insufficiency, hyperlipidemia, anxiety, depression who presents today for follow-up of anxiety depression.  He was last evaluated on 07/10/2022, his daughter and wife joined [place] that day.  They endorsed noticing symptoms of wanting to be in the bed for the day, constantly worrying about things, sadness regarding his loss of independence.  His wife requested low-dose medication for treatment of symptoms, patient agreed.  He was initiated on Zoloft 25 mg and asked to follow-up 6 weeks later.  Today he endorses taking Zoloft for about 3 weeks and then discontinued.  His wife decided to stop his medication as it caused insomnia. He denies persistent worrying, feeling down/sad. He's feeling better overall.   He was evaluated in the ED on 08/21/22 via EMS for expressive aphasia. There were no acute findings on CT and MRI brain. Neurology consulted who suspected symptoms to be residual from prior CVA. He was discharged home as his symptoms resolved. Since his ED visit he denies difficulty with word finding. He feels stronger. Completed PT recently as his physical progress hit a plateau. He has an appointment scheduled with his neurologist for next week.   He is needing a new prescription for atorvastatin as he has been cutting the 20 mg tablets in half to make 10 mg.  They have been more difficult to break.  Review of Systems  Respiratory:  Negative for shortness of breath.   Cardiovascular:  Negative for chest pain.  Neurological:  Negative for dizziness and headaches.  Psychiatric/Behavioral:  The patient is not nervous/anxious.          Past Medical History:  Diagnosis Date   Basal cell carcinoma 11/09/2019   nod & infil-behind  right ear-cx3 &exc   Basal cell carcinoma 03/21/2020   Residual BCC with peripheral margin involved - ST recommends MOHs   Carotid artery occlusion    Coronary artery disease    s/p CABG 2005; sees Dr Johnsie Cancel yearly   Diabetes mellitus without complication ([place])    Gynecomastia    History of COVID-19 01/08/2022   Hypercholesterolemia    Hypertension    Neurodermatitis    Overweight(278.02)    Personal history of colonic polyps 10/08/2006   tubular adenomas   Renal insufficiency    Stroke [facility])    TIA  April 2022   Transient ischemic attack 2010   "lasted ~ 5 seconds"   Vitamin D deficiency     Social History   Socioeconomic History   Marital status: Married    Spouse name: [name] [name]   Number of children: 1   Years of education: Not on file   Highest education level: Some college, no degree  Occupational History   Occupation: retired  Tobacco Use   Smoking status: Never   Smokeless tobacco: Former  Scientific laboratory technician Use: Never used  Substance and Sexual Activity   Alcohol use: No    Alcohol/week: 0.0 standard drinks of alcohol   Drug use: No   Sexual activity: Yes  Other Topics Concern   Not on file  Social History Narrative   Retired.   Once worked for the [employer].   Married.   Enjoys reading, spending time with family.    Left  handed   Drinks caffeine   One story home   Social Determinants of Health   Financial Resource Strain: Low Risk  (06/14/2022)   Overall Financial Resource Strain (CARDIA)    Difficulty of Paying Living Expenses: Not hard at all  Food Insecurity: No Food Insecurity (06/14/2022)   Hunger Vital Sign    Worried About Running Out of Food in the Last Year: Never true    Ran Out of Food in the Last Year: Never true  Transportation Needs: No Transportation Needs (06/14/2022)   PRAPARE - Hydrologist (Medical): No    Lack of Transportation (Non-Medical): No  Physical Activity: Insufficiently Active (06/14/2022)   Exercise  Vital Sign    Days of Exercise per Week: 3 days    Minutes of Exercise per Session: 30 min  Stress: No Stress Concern Present (06/14/2022)   [place]    Feeling of Stress : Not at all  Social Connections: Moderately Isolated (06/14/2022)   Social Connection and Isolation Panel [NHANES]    Frequency of Communication with Friends and Family: More than three times a week    Frequency of Social Gatherings with Friends and Family: Once a week    Attends Religious Services: Never    Marine scientist or Organizations: No    Attends Archivist Meetings: Never    Marital Status: Married  Human resources officer Violence: Not At Risk (06/14/2022)   Humiliation, Afraid, Rape, and Kick questionnaire    Fear of Current or Ex-Partner: No    Emotionally Abused: No    Physically Abused: No    Sexually Abused: No    Past Surgical History:  Procedure Laterality Date   CARDIAC CATHETERIZATION  02/2004   "tried to stent; couldn't"   CAROTID ENDARTERECTOMY Right 08/26/2015   COLONOSCOPY     CORONARY ANGIOPLASTY     CORONARY ARTERY BYPASS GRAFT  Feb. 2005   4 vessel   ENDARTERECTOMY Right 08/26/2015   Procedure: Right Carotid ENDARTERECTOMY with Patch Angioplasty ;  Surgeon: Rosetta Posner, MD;  Location: [facility] OR;  Service: Vascular;  Laterality: Right;   ENDARTERECTOMY Left 05/18/2021   Procedure: LEFT CAROTID ARTERY ENDARTERECTOMY  with patch angioplasty;  Surgeon: Rosetta Posner, MD;  Location: [facility] OR;  Service: Vascular;  Laterality: Left;   EYE SURGERY     bilateral cataract   KELOID EXCISION  04/2008   on chest scar; Dr. Dessie Coma   KELOID EXCISION     PILONIDAL CYST EXCISION  1989    Family History  Problem Relation Age of Onset   Heart disease Mother        Before age 36   Diabetes Mother    Kidney disease Mother    Heart attack Mother 31   Lung cancer Father 39   Diabetes Brother    Heart disease Brother    Heart  disease Brother    Arthritis Brother    Diabetes Sister    Fibromyalgia Sister    Lung cancer Paternal Uncle        questionable as to if it was lung ca   Healthy Daughter    Colon cancer Neg Hx    Stroke Neg Hx     Allergies  Allergen Reactions   Niacin Other (See Comments)    intol to NIACIN w/ headaches    Ativan [Lorazepam] Other (See Comments)    agitation    Current  Outpatient Medications on File Prior to Visit  Medication Sig Dispense Refill   cholecalciferol (VITAMIN D3) 25 MCG (1000 UNIT) tablet Take 1,000 Units by mouth daily.     clopidogrel (PLAVIX) 75 MG tablet Take 1 tablet (75 mg total) by mouth daily. 90 tablet 3   cyanocobalamin 1000 MCG tablet Take 1,000 mcg by mouth daily.     glucose blood (ACCU-CHEK GUIDE) test strip USE AS DIRECTED TO TEST BLOOD SUGAR UP TO 4 TIMES DAILY 100 strip 3   hydrochlorothiazide (HYDRODIURIL) 12.5 MG tablet Take 12.5 mg by mouth daily.     losartan (COZAAR) 25 MG tablet TAKE 2 TABLETS (50 MG TOTAL) BY MOUTH DAILY. FOR BLOOD PRESSURE. 180 tablet 1   nitroGLYCERIN (NITROSTAT) 0.4 MG SL tablet Place 1 tablet (0.4 mg total) under the tongue every 5 (five) minutes as needed for chest pain. 25 tablet 3   No current facility-administered medications on file prior to visit.    BP 126/76   Pulse 76   Temp 98.6 F (37 C) (Oral)   Ht '6\' 1"'$  (1.854 m)   Wt 214 lb (97.1 kg)   SpO2 95%   BMI 28.23 kg/m  Objective:   Physical Exam Cardiovascular:     Rate and Rhythm: Normal rate and regular rhythm.  Pulmonary:     Effort: Pulmonary effort is normal.     Breath sounds: Normal breath sounds. No wheezing or rales.  Musculoskeletal:     Cervical back: Neck supple.  Skin:    General: Skin is warm and dry.  Neurological:     Mental Status: He is alert and oriented to person, place, and time.  Psychiatric:        Mood and Affect: Mood normal.           Assessment & Plan:   Problem List Items Addressed This Visit        Cardiovascular and Mediastinum   TIA (transient ischemic attack)    Reviewed ED notes from August 2023. Reviewed MRI and CT scan from ED visit in August 2023.  Following with neurology, appointment scheduled for next week.   Exam today stable.       Relevant Medications   atorvastatin (LIPITOR) 10 MG tablet     Other   Anxiety, mild    Improved. No concerns today.  Zoloft intolerable.  Remain off Zoloft.  Continue to monitor.      Hyperlipidemia    Change pill form of atorvastatin from 20 mg to 10 mg so that he does not have to break his tablets in half. New prescription sent to pharmacy for atorvastatin 10 mg daily.      Relevant Medications   atorvastatin (LIPITOR) 10 MG tablet   Depression    Improved.  Could not tolerate Zoloft. Remain off.  Continue to monitor.           Pleas Koch, NP

## 2022-08-28 NOTE — Assessment & Plan Note (Signed)
Improved.  Could not tolerate Zoloft. Remain off.  Continue to monitor.

## 2022-08-28 NOTE — Assessment & Plan Note (Signed)
Reviewed ED notes from August 2023. Reviewed MRI and CT scan from ED visit in August 2023.  Following with neurology, appointment scheduled for next week.   Exam today stable.

## 2022-08-28 NOTE — Assessment & Plan Note (Signed)
Improved. No concerns today.  Zoloft intolerable.  Remain off Zoloft.  Continue to monitor.

## 2022-08-29 ENCOUNTER — Other Ambulatory Visit: Payer: Self-pay | Admitting: Primary Care

## 2022-08-29 DIAGNOSIS — I10 Essential (primary) hypertension: Secondary | ICD-10-CM

## 2022-09-06 ENCOUNTER — Ambulatory Visit: Payer: Medicare PPO | Admitting: Neurology

## 2022-09-06 ENCOUNTER — Encounter: Payer: Self-pay | Admitting: Neurology

## 2022-09-06 VITALS — BP 108/67 | HR 78 | Ht 73.0 in | Wt 211.6 lb

## 2022-09-06 DIAGNOSIS — I999 Unspecified disorder of circulatory system: Secondary | ICD-10-CM

## 2022-09-06 DIAGNOSIS — G459 Transient cerebral ischemic attack, unspecified: Secondary | ICD-10-CM

## 2022-09-06 DIAGNOSIS — R410 Disorientation, unspecified: Secondary | ICD-10-CM | POA: Diagnosis not present

## 2022-09-06 DIAGNOSIS — F0670 Mild neurocognitive disorder due to known physiological condition without behavioral disturbance: Secondary | ICD-10-CM

## 2022-09-06 DIAGNOSIS — I10 Essential (primary) hypertension: Secondary | ICD-10-CM

## 2022-09-06 DIAGNOSIS — I6522 Occlusion and stenosis of left carotid artery: Secondary | ICD-10-CM | POA: Diagnosis not present

## 2022-09-06 DIAGNOSIS — E782 Mixed hyperlipidemia: Secondary | ICD-10-CM | POA: Diagnosis not present

## 2022-09-06 NOTE — Progress Notes (Signed)
NEUROLOGY FOLLOW UP OFFICE NOTE  [name] [name] [id number]  Assessment/Plan:   Transient episode of confusion.  ED described aphasia but it really wasn't aphasia.  TIA is possible but uncertain as he did not clearly have lateralizing symptoms.  I do not suspect recrudescence of symptoms from prior stroke in absence of offending etiology such as UTI.  Seizure is possible but less likely.  I question if these are just brief episodes of confusion associated with underlying mild vascular neurocognitive impairment. Right corona radiata stroke Left internal carotid artery stenosis status post carotid endarterectomy Status post right carotid endarterectomy Type 2 diabetes mellitus Hypertension Hyperlipidemia Coronary artery disease   1  Will check neuropsychological evaluation 2  Patient should not drive.  [place] law states no driving for 6 months from last episode of loss of awareness/consciousness.   3  Secondary stroke prevention as managed by PCP/cardiology:             - Plavix '75mg'$  daily and ASA '81mg'$  daily until 6/1, then Plavix '75mg'$  daily alone             - Statin.  LDL goal less than 70             - Hgb A1c goal less than 7             - Normotensive blood pressure 4  Continue PT/OT/Speech. 5    If depressed mood is a problem, consider antidepressant. 6.  Follow up 4 months   History of Present Illness:  [name] [name] is a 81 year old left-handed male with HTN, HLD, DMII, CAD, carotid artery disease s/p R CEA and history of TIA who follows up for possible TIA.   UPDATE: Current medications: Plavix '75mg'$  daily, ASA '81mg'$  daily, atorvastatin '10mg'$  daily, HCTZ, losartan   EEG on 04/25/2022 showed mild generalized background slowing but no epileptiform discharges.  30 day cardiac event monitor in May-June did not reveal a fib.    On 08/21/2022, he had another episode of aphasia, waking up with difficulty putting words together and using incorrect words.  He kept talking about a  battery to change for his hearing aid.  He felt confused.  He went to the bathroom and didn't remember why he was in the bathroom.  No headache, facial droop, unilateral numbness/weakness or other lateralizing symptoms.  Symptoms lasted about 2 hours.  Went to ED.  MRI of brain personally reviewed showed mild DWI hyperintensity in the right corona radiata with ADC correlate potentially indicating subacute infarct vs artifact (where he had a previous stroke) but no obvious acute stroke.    He did not have infection such as UTI.  It was proposed that he may have had recrudescence from prior stroke.  Hgb A1c on 07/10/2022 was 6.4.  HISTORY: He was admitted to [facility] from 04/03/2021 to 04/06/2021 for transient left-sided numbness and weakness lasting an hour.  CT head was negative for follow up MRI of brain showed acute small right thalamic infarct with chronic small vessel ischemic changes and multiple chronic lacunar infarcts.  CTA of head and neck showed 70-80% left ICA stenosis.  2D echo showed EF 60-65% with no cardiac source of embolus.  LDL was 86 and Hgb A1c 6.7.  Prior to admission, hne was on ASA '81mg'$  daily.  He was discharged on ASA '81mg'$  and Plavix '75mg'$  daily for 3 weeks followed by Plavix alone.  Lipitor was increased from 10 to '40mg'$  daily. Underwent  carotid endarterectomy on 05/18/2021 for asymptomatic left ICA stenosis.   Patient was admitted to the hospital on 02/28/2022 for 2-3 days of left leg weakness, left facial weakness and slurred speech.   MRI of brain showed acute infarct within the right corona radiata.  CTA head and neck showed calcified atherosclerotic plaque in the intercranial ICAs causing mild-moderate stenosis on left and mild on right, left and right carotid endarterectomy with no residual significant stenosis or occlusion, and unchanged moderate right and mild left vertebral artery stenosis.  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  2D echo showed LVEF 55-60% with Grade I diastolic dysfunction, trival MR, aortic valve sclerosis with no evidence of stenosis with no thrombus or atrial level shunt.  LDL 55 and Hgb A1c 6.1.  He was discharged on ASA and Plavix for 3 months followed by Plavix alone.   In April 2023, he had started having language difficulty.  He struggled to get words out.  It started to frustrate him.  No slurred speech, facial droop, or unilateral numbness or weakness.  Symptoms lasted 30 minutes.  He remembers some bits but doesn't remember most of the event. Today blood pressure has been 160-170s/70s-100.  Prior to today, then ave been in the 130s/60s.  He has an appointment with his PCP later today.  Denies palpitations.        PAST MEDICAL HISTORY: Past Medical History:  Diagnosis Date   Basal cell carcinoma 11/09/2019   nod & infil-behind right ear-cx3 &exc   Basal cell carcinoma 03/21/2020   Residual BCC with peripheral margin involved - ST recommends MOHs   Carotid artery occlusion    Coronary artery disease    s/p CABG 2005; sees Dr Johnsie Cancel yearly   Diabetes mellitus without complication ([place])    Gynecomastia    History of COVID-19 01/08/2022   Hypercholesterolemia    Hypertension    Neurodermatitis    Overweight(278.02)    Personal history of colonic polyps 10/08/2006   tubular adenomas   Renal insufficiency    Stroke [facility])    TIA  April 2022   Transient ischemic attack 2010   "lasted ~ 5 seconds"   Vitamin D deficiency     MEDICATIONS: Current Outpatient Medications on File Prior to Visit  Medication Sig Dispense Refill   atorvastatin (LIPITOR) 10 MG tablet Take 1 tablet (10 mg total) by mouth daily. for cholesterol. 90 tablet 1   cholecalciferol (VITAMIN D3) 25 MCG (1000 UNIT) tablet Take 1,000 Units by mouth daily.     clopidogrel (PLAVIX) 75 MG tablet Take 1 tablet (75 mg total) by mouth daily. 90 tablet  3   cyanocobalamin 1000 MCG tablet Take 1,000 mcg by mouth daily.     glucose blood (ACCU-CHEK GUIDE) test strip USE AS DIRECTED TO TEST BLOOD SUGAR UP TO 4 TIMES DAILY 100 strip 3   hydrochlorothiazide (HYDRODIURIL) 12.5 MG tablet TAKE 1 TABLET (12.5 MG TOTAL) BY MOUTH DAILY. FOR BLOOD PRESSURE. 90 tablet 1   losartan (COZAAR) 25 MG tablet TAKE 2 TABLETS (50 MG TOTAL) BY MOUTH DAILY. FOR BLOOD PRESSURE. 180 tablet 1   nitroGLYCERIN (NITROSTAT) 0.4 MG SL tablet Place 1 tablet (0.4 mg total) under the tongue every 5 (five) minutes as needed for chest pain. 25 tablet 3   No current facility-administered medications on file prior to visit.    ALLERGIES: Allergies  Allergen  Reactions   Niacin Other (See Comments)    intol to NIACIN w/ headaches    Ativan [Lorazepam] Other (See Comments)    agitation    FAMILY HISTORY: Family History  Problem Relation Age of Onset   Heart disease Mother        Before age 64   Diabetes Mother    Kidney disease Mother    Heart attack Mother 76   Lung cancer Father 39   Diabetes Brother    Heart disease Brother    Heart disease Brother    Arthritis Brother    Diabetes Sister    Fibromyalgia Sister    Lung cancer Paternal Uncle        questionable as to if it was lung ca   Healthy Daughter    Colon cancer Neg Hx    Stroke Neg Hx       Objective:  Blood pressure 108/67, pulse 78, height '6\' 1"'$  (1.854 m), weight 211 lb 9.6 oz (96 kg), SpO2 95 %. General: No acute distress.  Patient appears well-groomed.   Head:  Normocephalic/atraumatic Eyes:  Fundi examined but not visualized Neck: supple, no paraspinal tenderness, full range of motion Heart:  Regular rate and rhythm Lungs:  Clear to auscultation bilaterally Back: No paraspinal tenderness Neurological Exam: alert and oriented to person, place, and time.  Speech fluent and not dysarthric, language intact.  CN II-XII intact. Bulk and tone normal, mild decreased dexterity in right hand,  otherwise muscle strength 5/5 throughout.  Sensation to light touch intact.  Deep tendon reflexes 2+ throughout, toes downgoing.  Finger to nose testing intact.  Cautious broad-based gait, requires use of walker.   Metta Clines, DO  CC: Alma Friendly, NP

## 2022-09-06 NOTE — Patient Instructions (Signed)
Neuropsychological evaluation NO drivng

## 2022-09-21 ENCOUNTER — Telehealth: Payer: Self-pay | Admitting: Primary Care

## 2022-09-21 NOTE — Telephone Encounter (Signed)
Please contact wife to go over concerns. I'm okay to refer him to PT without an office visit.  Home health or outpatient?

## 2022-09-21 NOTE — Telephone Encounter (Signed)
Wife called in stating that she thinks that her husband may need in home PT again,due to him being weaker, on the 22nd of August he had a TIA. She would like a phone call just to discuss what's going on with her husband,and where to go from there.

## 2022-09-24 ENCOUNTER — Other Ambulatory Visit: Payer: Self-pay | Admitting: Primary Care

## 2022-09-24 DIAGNOSIS — R2681 Unsteadiness on feet: Secondary | ICD-10-CM

## 2022-09-24 DIAGNOSIS — Z8673 Personal history of transient ischemic attack (TIA), and cerebral infarction without residual deficits: Secondary | ICD-10-CM

## 2022-09-24 NOTE — Telephone Encounter (Signed)
Called patients wife, she would like home health PT with the same office as before. Looks like it was with [place] home [facility].

## 2022-09-24 NOTE — Telephone Encounter (Signed)
Noted.  Referral placed for home health PT

## 2022-09-24 NOTE — Telephone Encounter (Signed)
Called and spoke to wife( on dpr) let her know that PT would be the one to let her know about coverage. She will verify with them once they reach out to her.

## 2022-09-24 NOTE — Telephone Encounter (Signed)
Patient wife called in and was wanting to know if insurance will approve it. Please advise. Thank you!

## 2022-10-02 ENCOUNTER — Telehealth: Payer: Self-pay | Admitting: Primary Care

## 2022-10-02 NOTE — Telephone Encounter (Signed)
Approved.  

## 2022-10-02 NOTE — Telephone Encounter (Signed)
Home Health verbal orders Caller Name: [place] Name: Alvis Lemmings [place] number: [phone], secured, no ext   Requesting OT/PT/Skilled nursing/Social Work/Speech: physical therapy   Reason:  Frequency: once x one week , twice a week for 4 weeks   Please forward to [facility] pool or providers CMA

## 2022-10-03 NOTE — Telephone Encounter (Signed)
Left secured voicemail for [name] [name], advising of approval.

## 2022-10-07 ENCOUNTER — Other Ambulatory Visit: Payer: Self-pay | Admitting: Primary Care

## 2022-10-07 DIAGNOSIS — F419 Anxiety disorder, unspecified: Secondary | ICD-10-CM

## 2022-10-31 ENCOUNTER — Ambulatory Visit (INDEPENDENT_AMBULATORY_CARE_PROVIDER_SITE_OTHER): Payer: Medicare PPO | Admitting: Podiatry

## 2022-10-31 ENCOUNTER — Encounter: Payer: Self-pay | Admitting: Podiatry

## 2022-10-31 DIAGNOSIS — B351 Tinea unguium: Secondary | ICD-10-CM

## 2022-10-31 DIAGNOSIS — M79674 Pain in right toe(s): Secondary | ICD-10-CM

## 2022-10-31 DIAGNOSIS — D689 Coagulation defect, unspecified: Secondary | ICD-10-CM

## 2022-10-31 DIAGNOSIS — M79675 Pain in left toe(s): Secondary | ICD-10-CM | POA: Diagnosis not present

## 2022-10-31 NOTE — Progress Notes (Signed)
This patient presents  to my office for at risk foot care.  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 Patient has history of CVA. This patient  takes plavix.This patient is unable to cut nails himself since the patient cannot reach his nails.These nails are painful walking and wearing shoes.  This patient presents for at risk foot care today.  General Appearance  Alert, conversant and in no acute stress.  Vascular  Dorsalis pedis and posterior tibial  pulses are weakly  palpable  bilaterally.  Capillary return is within normal limits  bilaterally. Temperature is within normal limits  bilaterally. Swelling persists in feet.   Neurologic  Senn-Weinstein monofilament wire test within normal limits  bilaterally. Muscle power within normal limits bilaterally.  Nails Thick disfigured discolored nails with subungual debris  from hallux to fifth toes bilaterally. No evidence of bacterial infection or drainage bilaterally.  Orthopedic  No limitations of motion  feet .  No crepitus or effusions noted.  No bony pathology or digital deformities noted.  Skin  normotropic skin with no porokeratosis noted bilaterally.  No signs of infections or ulcers noted.     Onychomycosis  Pain in right toes  Pain in left toes  Consent was obtained for treatment procedures.  Mechanical debridement of nails 1-5  bilaterally performed with a nail nipper.  Filed with dremel without incident.    Return office visit    3 months                  Told patient to return for periodic foot care and evaluation due to potential at risk complications.   Gardiner Barefoot DPM

## 2022-11-05 DIAGNOSIS — Z961 Presence of intraocular lens: Secondary | ICD-10-CM | POA: Diagnosis not present

## 2022-11-05 DIAGNOSIS — H26491 Other secondary cataract, right eye: Secondary | ICD-10-CM | POA: Diagnosis not present

## 2022-11-05 DIAGNOSIS — E119 Type 2 diabetes mellitus without complications: Secondary | ICD-10-CM | POA: Diagnosis not present

## 2022-11-05 LAB — HM DIABETES EYE EXAM

## 2022-11-06 ENCOUNTER — Encounter: Payer: Self-pay | Admitting: Primary Care

## 2022-11-27 ENCOUNTER — Other Ambulatory Visit: Payer: Self-pay | Admitting: Primary Care

## 2022-11-27 DIAGNOSIS — I10 Essential (primary) hypertension: Secondary | ICD-10-CM

## 2023-01-07 ENCOUNTER — Ambulatory Visit: Payer: Medicare PPO | Admitting: Neurology

## 2023-01-10 DIAGNOSIS — M25512 Pain in left shoulder: Secondary | ICD-10-CM | POA: Diagnosis not present

## 2023-01-10 DIAGNOSIS — G8929 Other chronic pain: Secondary | ICD-10-CM | POA: Diagnosis not present

## 2023-01-11 DIAGNOSIS — G8929 Other chronic pain: Secondary | ICD-10-CM | POA: Diagnosis not present

## 2023-01-11 DIAGNOSIS — M25512 Pain in left shoulder: Secondary | ICD-10-CM | POA: Diagnosis not present

## 2023-01-28 DIAGNOSIS — R29898 Other symptoms and signs involving the musculoskeletal system: Secondary | ICD-10-CM | POA: Diagnosis not present

## 2023-01-28 DIAGNOSIS — M25512 Pain in left shoulder: Secondary | ICD-10-CM | POA: Diagnosis not present

## 2023-01-28 DIAGNOSIS — G8929 Other chronic pain: Secondary | ICD-10-CM | POA: Diagnosis not present

## 2023-01-28 DIAGNOSIS — Z7409 Other reduced mobility: Secondary | ICD-10-CM | POA: Diagnosis not present

## 2023-01-28 DIAGNOSIS — M25612 Stiffness of left shoulder, not elsewhere classified: Secondary | ICD-10-CM | POA: Diagnosis not present

## 2023-01-28 DIAGNOSIS — Z789 Other specified health status: Secondary | ICD-10-CM | POA: Diagnosis not present

## 2023-02-01 NOTE — Progress Notes (Unsigned)
NEUROLOGY FOLLOW UP OFFICE NOTE  PEARSE SHIFFLER [id number]  Assessment/Plan:   Transient episode of confusion.  ED described aphasia but it really wasn't aphasia.  TIA is possible but uncertain as he did not clearly have lateralizing symptoms.  I do not suspect recrudescence of symptoms from prior stroke in absence of offending etiology such as UTI.  Seizure is possible but less likely.  I question if these are just brief episodes of confusion associated with underlying mild vascular neurocognitive impairment. 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 2  ***[place] law states no driving for 6 months from last episode of loss of awareness/consciousness.   3  Secondary stroke prevention as managed by PCP/cardiology:             - Plavix '75mg'$  daily alone             - Statin.  LDL goal less than 70             - Hgb A1c goal less than 7             - Normotensive blood pressure 4  Follow up ***   History of Present Illness:  JESSEJAMES STEELMAN is a 82 year old left-handed male with HTN, HLD, DMII, CAD, carotid artery disease s/p R CEA and history of TIA who follows up for possible TIA.   UPDATE: Current medications: Plavix '75mg'$  daily, ASA '81mg'$  daily, atorvastatin '10mg'$  daily, HCTZ, losartan   ***    HISTORY: He was admitted to [facility] from 04/03/2021 to 04/06/2021 for transient left-sided numbness and weakness lasting an hour.  CT head was negative for follow up MRI of brain showed acute small right thalamic infarct with chronic small vessel ischemic changes and multiple chronic lacunar infarcts.  CTA of head and neck showed 70-80% left ICA stenosis.  2D echo showed EF 60-65% with no cardiac source of embolus.  Prior to admission, hne was on  ASA '81mg'$  daily.  He was discharged on ASA '81mg'$  and Plavix '75mg'$  daily for 3 weeks followed by Plavix alone.   Underwent carotid endarterectomy on 05/18/2021 for asymptomatic left ICA stenosis.   Patient was admitted to the hospital on 02/28/2022 for 2-3 days of left leg weakness, left facial weakness and slurred speech.   MRI of brain showed acute infarct within the right corona radiata.  CTA head and neck showed calcified atherosclerotic plaque in the intercranial ICAs causing mild-moderate stenosis on left and mild on right, left and right carotid endarterectomy with no residual significant stenosis or occlusion, and unchanged moderate right and mild left vertebral artery stenosis.  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  2D echo showed LVEF 55-60% with Grade I diastolic dysfunction, trival MR, aortic valve sclerosis with no evidence of stenosis with no thrombus or atrial level shunt.  He was discharged on ASA and Plavix for 3 months followed by Plavix alone.   In April 2023, he had started having language difficulty.  He struggled to get words out.  It started to frustrate him.  No slurred speech, facial droop, or unilateral numbness or weakness.  Symptoms lasted 30 minutes.  He remembers some bits but doesn't remember most  of the event. Today blood pressure has been 160-170s/70s-100.  Prior to today, then ave been in the 130s/60s.  He has an appointment with his PCP later today.  Denies palpitations.  EEG on 04/25/2022 showed mild generalized background slowing but no epileptiform discharges.  30 day cardiac event monitor in May-June did not reveal a fib.    On 08/21/2022, he had another episode of aphasia, waking up with difficulty putting words together and using incorrect words.  He kept talking about a battery to change for his hearing aid.  He felt confused.  He went to the bathroom and  didn't remember why he was in the bathroom.  No headache, facial droop, unilateral numbness/weakness or other lateralizing symptoms.  Symptoms lasted about 2 hours.  Went to ED.  MRI of brain showed mild DWI hyperintensity in the right corona radiata with ADC correlate potentially indicating subacute infarct vs artifact (where he had a previous stroke) but no obvious acute stroke.    He did not have infection such as UTI.  It was proposed that he may have had recrudescence from prior stroke.      PAST MEDICAL HISTORY: Past Medical History:  Diagnosis Date   Basal cell carcinoma 11/09/2019   nod & infil-behind right ear-cx3 &exc   Basal cell carcinoma 03/21/2020   Residual BCC with peripheral margin involved - ST recommends MOHs   Carotid artery occlusion    Coronary artery disease    s/p CABG 2005; sees Dr Johnsie Cancel yearly   Diabetes mellitus without complication ([place])    Gynecomastia    History of COVID-19 01/08/2022   Hypercholesterolemia    Hypertension    Neurodermatitis    Overweight(278.02)    Personal history of colonic polyps 10/08/2006   tubular adenomas   Renal insufficiency    Stroke [facility])    TIA  April 2022   Transient ischemic attack 2010   "lasted ~ 5 seconds"   Vitamin D deficiency     MEDICATIONS: Current Outpatient Medications on File Prior to Visit  Medication Sig Dispense Refill   atorvastatin (LIPITOR) 10 MG tablet Take 1 tablet (10 mg total) by mouth daily. for cholesterol. 90 tablet 1   cholecalciferol (VITAMIN D3) 25 MCG (1000 UNIT) tablet Take 1,000 Units by mouth daily.     clopidogrel (PLAVIX) 75 MG tablet Take 1 tablet (75 mg total) by mouth daily. 90 tablet 3   cyanocobalamin 1000 MCG tablet Take 1,000 mcg by mouth daily.     glucose blood (ACCU-CHEK GUIDE) test strip USE AS DIRECTED TO TEST BLOOD SUGAR UP TO 4 TIMES DAILY 100 strip 3   hydrochlorothiazide (HYDRODIURIL) 12.5 MG tablet TAKE 1 TABLET (12.5 MG TOTAL) BY MOUTH DAILY. FOR BLOOD PRESSURE. 90  tablet 1   losartan (COZAAR) 25 MG tablet TAKE 2 TABLETS (50 MG TOTAL) BY MOUTH DAILY. FOR BLOOD PRESSURE. 180 tablet 1   nitroGLYCERIN (NITROSTAT) 0.4 MG SL tablet Place 1 tablet (0.4 mg total) under the tongue every 5 (five) minutes as needed for chest pain. 25 tablet 3   No current facility-administered medications on file prior to visit.    ALLERGIES: Allergies  Allergen Reactions   Niacin Other (See Comments)    intol to NIACIN w/ headaches    Ativan [Lorazepam] Other (See Comments)    agitation    FAMILY HISTORY: Family History  Problem Relation Age of Onset   Heart disease Mother        Before age 37   Diabetes  Mother    Kidney disease Mother    Heart attack Mother 4   Lung cancer Father 75   Diabetes Brother    Heart disease Brother    Heart disease Brother    Arthritis Brother    Diabetes Sister    Fibromyalgia Sister    Lung cancer Paternal Uncle        questionable as to if it was lung ca   Healthy Daughter    Colon cancer Neg Hx    Stroke Neg Hx       Objective:  *** General: No acute distress.  Patient appears well-groomed.   Head:  Normocephalic/atraumatic Eyes:  Fundi examined but not visualized Neck: supple, no paraspinal tenderness, full range of motion Heart:  Regular rate and rhythm Neurological Exam: alert and oriented to person, place, and time.  Speech fluent and not dysarthric, language intact.  CN II-XII intact. Bulk and tone normal; mild decreased dexterity in right hand, otherwise muscle strength 5/5 throughout.  Sensation to light touch intact.  Deep tendon reflexes 2+ throughout.  Finger to nose testing intact.  Cautious broad-based gait.  ***   Metta Clines, DO  CC: Alma Friendly, NP

## 2023-02-04 ENCOUNTER — Ambulatory Visit: Payer: Medicare PPO | Admitting: Neurology

## 2023-02-04 ENCOUNTER — Encounter: Payer: Self-pay | Admitting: Neurology

## 2023-02-04 VITALS — BP 126/77 | HR 78 | Ht 67.0 in | Wt 213.0 lb

## 2023-02-04 DIAGNOSIS — I679 Cerebrovascular disease, unspecified: Secondary | ICD-10-CM

## 2023-02-04 DIAGNOSIS — E119 Type 2 diabetes mellitus without complications: Secondary | ICD-10-CM

## 2023-02-04 DIAGNOSIS — G459 Transient cerebral ischemic attack, unspecified: Secondary | ICD-10-CM | POA: Diagnosis not present

## 2023-02-04 DIAGNOSIS — I6522 Occlusion and stenosis of left carotid artery: Secondary | ICD-10-CM

## 2023-02-04 DIAGNOSIS — I10 Essential (primary) hypertension: Secondary | ICD-10-CM

## 2023-02-04 NOTE — Patient Instructions (Signed)
Continue plavix, atorvastatin and medications for blood pressure and diabetes I think you can drive but since it has been so long, I would take a refresher course or get a formal driving safety evaluation.  Until then, I wouldn't drive alone, only with somebody with you.

## 2023-02-06 ENCOUNTER — Ambulatory Visit: Payer: Medicare PPO | Admitting: Podiatry

## 2023-02-06 ENCOUNTER — Encounter: Payer: Self-pay | Admitting: Podiatry

## 2023-02-06 DIAGNOSIS — M79675 Pain in left toe(s): Secondary | ICD-10-CM | POA: Diagnosis not present

## 2023-02-06 DIAGNOSIS — B351 Tinea unguium: Secondary | ICD-10-CM

## 2023-02-06 DIAGNOSIS — M79674 Pain in right toe(s): Secondary | ICD-10-CM | POA: Diagnosis not present

## 2023-02-06 DIAGNOSIS — E1165 Type 2 diabetes mellitus with hyperglycemia: Secondary | ICD-10-CM | POA: Diagnosis not present

## 2023-02-06 NOTE — Progress Notes (Signed)
This patient presents  to my office for at risk foot care.  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 Patient has history of CVA. This patient  takes plavix.This patient is unable to cut nails himself since the patient cannot reach his nails.These nails are painful walking and wearing shoes.  This patient presents for at risk foot care today.  General Appearance  Alert, conversant and in no acute stress.  Vascular  Dorsalis pedis and posterior tibial  pulses are weakly  palpable  bilaterally.  Capillary return is within normal limits  bilaterally. Temperature is within normal limits  bilaterally. Swelling persists in feet.   Neurologic  Senn-Weinstein monofilament wire test within normal limits  bilaterally. Muscle power within normal limits bilaterally.  Nails Thick disfigured discolored nails with subungual debris  from hallux to fifth toes bilaterally. No evidence of bacterial infection or drainage bilaterally.  Orthopedic  No limitations of motion  feet .  No crepitus or effusions noted.  No bony pathology or digital deformities noted.  Skin  normotropic skin with no porokeratosis noted bilaterally.  No signs of infections or ulcers noted.     Onychomycosis  Pain in right toes  Pain in left toes  Consent was obtained for treatment procedures.  Mechanical debridement of nails 1-5  bilaterally performed with a nail nipper.  Filed with dremel without incident.    Return office visit    3 months                  Told patient to return for periodic foot care and evaluation due to potential at risk complications.   Gardiner Barefoot DPM

## 2023-02-11 DIAGNOSIS — R29898 Other symptoms and signs involving the musculoskeletal system: Secondary | ICD-10-CM | POA: Diagnosis not present

## 2023-02-11 DIAGNOSIS — R6889 Other general symptoms and signs: Secondary | ICD-10-CM | POA: Diagnosis not present

## 2023-02-11 DIAGNOSIS — G8929 Other chronic pain: Secondary | ICD-10-CM | POA: Diagnosis not present

## 2023-02-11 DIAGNOSIS — M25612 Stiffness of left shoulder, not elsewhere classified: Secondary | ICD-10-CM | POA: Diagnosis not present

## 2023-02-11 DIAGNOSIS — M25512 Pain in left shoulder: Secondary | ICD-10-CM | POA: Diagnosis not present

## 2023-02-12 ENCOUNTER — Telehealth (INDEPENDENT_AMBULATORY_CARE_PROVIDER_SITE_OTHER): Payer: Medicare PPO | Admitting: Primary Care

## 2023-02-12 ENCOUNTER — Encounter: Payer: Self-pay | Admitting: Primary Care

## 2023-02-12 VITALS — Ht 72.0 in | Wt 213.0 lb

## 2023-02-12 DIAGNOSIS — R2681 Unsteadiness on feet: Secondary | ICD-10-CM | POA: Diagnosis not present

## 2023-02-12 DIAGNOSIS — Z8673 Personal history of transient ischemic attack (TIA), and cerebral infarction without residual deficits: Secondary | ICD-10-CM | POA: Diagnosis not present

## 2023-02-12 NOTE — Patient Instructions (Signed)
You will either be contacted via phone regarding your referral to home health, or you may receive a letter on your MyChart portal from our referral team with instructions for scheduling an appointment. Please let us know if you have not been contacted by anyone within two-three days.  It was a pleasure to see you today!

## 2023-02-12 NOTE — Assessment & Plan Note (Signed)
Assistance clearly needed given his wife's unfortunate accident.  Referral placed for home health nursing and nurse aid.

## 2023-02-12 NOTE — Progress Notes (Signed)
Patient ID: [name] [name], male    DOB: [birth year], 82 y.o.   MRN: [id number]  Virtual visit completed through [place], a video enabled telemedicine application. Due to national recommendations of social distancing due to COVID-19, a virtual visit is felt to be most appropriate for this patient at this time. Reviewed limitations, risks, security and privacy concerns of performing a virtual visit and the availability of in person appointments. I also reviewed that there may be a patient responsible charge related to this service. The patient agreed to proceed.   Patient location: home Provider location: [place] at [facility], office Persons participating in this virtual visit: patient, provider   If any vitals were documented, they were collected by patient at home unless specified below.    Ht 6' (1.829 m)   Wt 213 lb (96.6 kg)   BMI 28.89 kg/m    CC: Home Health Aid Subjective:   HPI: [name] [name] is a 82 y.o. male with a history of hypertension, recurrent CVA, type 2 diabetes, unsteady gait, depression, intermittent confusion presenting on 02/12/2023 for home health aid request.   His wife joins [place] today who is assisting with HPI. His wife is requesting home health nursing and a home health aid to help care for patient while she cannot. She recently sustained a L1 compression fracture and has been unable to assist patient with ADL's.   Patient has suffered several strokes and typically requires help from his wife with bathing, cooking, dressing, and toileting. He is also currently undergoing PT for left shoulder pain with reduction in ROM. He does have difficulty dressing and getting out of the chair due to his shoulder pain/limited ROM.      Relevant past medical, surgical, family and social history reviewed and updated as indicated. Interim medical history since our last visit reviewed. Allergies and medications reviewed and updated. Outpatient Medications Prior to  Visit  Medication Sig Dispense Refill   atorvastatin (LIPITOR) 10 MG tablet Take 1 tablet (10 mg total) by mouth daily. for cholesterol. 90 tablet 1   cholecalciferol (VITAMIN D3) 25 MCG (1000 UNIT) tablet Take 1,000 Units by mouth daily.     clopidogrel (PLAVIX) 75 MG tablet Take 1 tablet (75 mg total) by mouth daily. 90 tablet 3   cyanocobalamin 1000 MCG tablet Take 1,000 mcg by mouth daily.     glucose blood (ACCU-CHEK GUIDE) test strip USE AS DIRECTED TO TEST BLOOD SUGAR UP TO 4 TIMES DAILY 100 strip 3   hydrochlorothiazide (HYDRODIURIL) 12.5 MG tablet TAKE 1 TABLET (12.5 MG TOTAL) BY MOUTH DAILY. FOR BLOOD PRESSURE. 90 tablet 1   losartan (COZAAR) 25 MG tablet TAKE 2 TABLETS (50 MG TOTAL) BY MOUTH DAILY. FOR BLOOD PRESSURE. 180 tablet 1   nitroGLYCERIN (NITROSTAT) 0.4 MG SL tablet Place 1 tablet (0.4 mg total) under the tongue every 5 (five) minutes as needed for chest pain. 25 tablet 3   No facility-administered medications prior to visit.     Per HPI unless specifically indicated in ROS section below Review of Systems  Musculoskeletal:  Positive for arthralgias.  Neurological:  Positive for weakness.   Objective:  Ht 6' (1.829 m)   Wt 213 lb (96.6 kg)   BMI 28.89 kg/m   Wt Readings from Last 3 Encounters:  02/12/23 213 lb (96.6 kg)  02/04/23 213 lb (96.6 kg)  09/06/22 211 lb 9.6 oz (96 kg)       Physical exam: General: Alert and oriented x 3,  no distress, does not appear sickly  Pulmonary: Speaks in complete sentences without increased work of breathing, no cough during visit.  Psychiatric: Normal mood, thought content, and behavior.     Results for orders placed or performed in visit on 11/06/22  HM DIABETES EYE EXAM  Result Value Ref Range   HM Diabetic Eye Exam No Retinopathy No Retinopathy   Assessment & Plan:   Problem List Items Addressed This Visit       Other   Unsteady gait - Primary    Referral placed for home health nursing and nurse aid.        Relevant Orders   Ambulatory referral to [facility]   History of CVA (cerebrovascular accident)    Assistance clearly needed given his wife's unfortunate accident.  Referral placed for home health nursing and nurse aid.      Relevant Orders   Ambulatory referral to Home Health     No orders of the defined types were placed in this encounter.  Orders Placed This Encounter  Procedures   Ambulatory referral to [facility]    Referral Priority:   Routine    Referral Type:   Home Health Care    Referral Reason:   Specialty Services Required    Requested Specialty:   [place]    Number of Visits Requested:   1    I discussed the assessment and treatment plan with the patient. The patient was provided an opportunity to ask questions and all were answered. The patient agreed with the plan and demonstrated an understanding of the instructions. The patient was advised to call back or seek an in-person evaluation if the symptoms worsen or if the condition fails to improve as anticipated.  Follow up plan:  You will either be contacted via phone regarding your referral to home health, or you may receive a letter on your MyChart portal from our referral team with instructions for scheduling an appointment. Please let us know if you have not been contacted by anyone within two-three days.  It was a pleasure to see you today!    Pleas Koch, NP

## 2023-02-12 NOTE — Assessment & Plan Note (Signed)
Referral placed for home health nursing and nurse aid.

## 2023-02-15 ENCOUNTER — Telehealth: Payer: Self-pay | Admitting: Primary Care

## 2023-02-15 DIAGNOSIS — R2689 Other abnormalities of gait and mobility: Secondary | ICD-10-CM | POA: Diagnosis not present

## 2023-02-15 DIAGNOSIS — M25512 Pain in left shoulder: Secondary | ICD-10-CM | POA: Diagnosis not present

## 2023-02-15 DIAGNOSIS — I10 Essential (primary) hypertension: Secondary | ICD-10-CM | POA: Diagnosis not present

## 2023-02-15 DIAGNOSIS — E119 Type 2 diabetes mellitus without complications: Secondary | ICD-10-CM | POA: Diagnosis not present

## 2023-02-15 DIAGNOSIS — K219 Gastro-esophageal reflux disease without esophagitis: Secondary | ICD-10-CM | POA: Diagnosis not present

## 2023-02-15 DIAGNOSIS — I69398 Other sequelae of cerebral infarction: Secondary | ICD-10-CM | POA: Diagnosis not present

## 2023-02-15 DIAGNOSIS — Z7902 Long term (current) use of antithrombotics/antiplatelets: Secondary | ICD-10-CM | POA: Diagnosis not present

## 2023-02-15 DIAGNOSIS — G8929 Other chronic pain: Secondary | ICD-10-CM | POA: Diagnosis not present

## 2023-02-15 DIAGNOSIS — F32A Depression, unspecified: Secondary | ICD-10-CM | POA: Diagnosis not present

## 2023-02-15 NOTE — Telephone Encounter (Signed)
[name] [name]  [phone] (secure)  Medication confirmation needed:  cholecalciferol (VITAMIN D3) 25 MCG (1000 UNIT) tablet is the patient still taking this medicine? cyanocobalamin 1000 MCG tablet  patient states this was decreased to 500MCG daily--need to confirm  Seeing [facility] for nursing and home health Need an order for physical therapy while he is home bound  Physical Therapy will call back with their frequency  Need Verbal okay for the following:  Nursing, once a week for 9 weeks  Home health aid twice a week for 4 weeks, and once weekly for four weeks  Primary diagnosis: stroke

## 2023-02-15 NOTE — Telephone Encounter (Signed)
Yes, continue vitamin D 1000 units daily. Please change cyanocobalamin to 500 mcg daily.  Approved orders.

## 2023-02-15 NOTE — Telephone Encounter (Signed)
Adrian Blackwater from [facility] called over and stated that he needed to discuss POC regarding patient. Thank you!

## 2023-02-15 NOTE — Telephone Encounter (Signed)
Left message for [name] [name] to call office back

## 2023-02-18 NOTE — Telephone Encounter (Signed)
Called and informed [name] [name] of [name] [name] message and gave approval of verbal orders. Will call back with any further questions. Changed b12 to 552mg daily.

## 2023-02-20 DIAGNOSIS — K219 Gastro-esophageal reflux disease without esophagitis: Secondary | ICD-10-CM | POA: Diagnosis not present

## 2023-02-20 DIAGNOSIS — G8929 Other chronic pain: Secondary | ICD-10-CM | POA: Diagnosis not present

## 2023-02-20 DIAGNOSIS — R2689 Other abnormalities of gait and mobility: Secondary | ICD-10-CM | POA: Diagnosis not present

## 2023-02-20 DIAGNOSIS — Z7902 Long term (current) use of antithrombotics/antiplatelets: Secondary | ICD-10-CM | POA: Diagnosis not present

## 2023-02-20 DIAGNOSIS — I10 Essential (primary) hypertension: Secondary | ICD-10-CM | POA: Diagnosis not present

## 2023-02-20 DIAGNOSIS — F32A Depression, unspecified: Secondary | ICD-10-CM | POA: Diagnosis not present

## 2023-02-20 DIAGNOSIS — I69398 Other sequelae of cerebral infarction: Secondary | ICD-10-CM | POA: Diagnosis not present

## 2023-02-20 DIAGNOSIS — E119 Type 2 diabetes mellitus without complications: Secondary | ICD-10-CM | POA: Diagnosis not present

## 2023-02-20 DIAGNOSIS — M25512 Pain in left shoulder: Secondary | ICD-10-CM | POA: Diagnosis not present

## 2023-02-21 DIAGNOSIS — I10 Essential (primary) hypertension: Secondary | ICD-10-CM | POA: Diagnosis not present

## 2023-02-21 DIAGNOSIS — F32A Depression, unspecified: Secondary | ICD-10-CM | POA: Diagnosis not present

## 2023-02-21 DIAGNOSIS — I69398 Other sequelae of cerebral infarction: Secondary | ICD-10-CM | POA: Diagnosis not present

## 2023-02-21 DIAGNOSIS — G8929 Other chronic pain: Secondary | ICD-10-CM | POA: Diagnosis not present

## 2023-02-21 DIAGNOSIS — Z7902 Long term (current) use of antithrombotics/antiplatelets: Secondary | ICD-10-CM | POA: Diagnosis not present

## 2023-02-21 DIAGNOSIS — R2689 Other abnormalities of gait and mobility: Secondary | ICD-10-CM | POA: Diagnosis not present

## 2023-02-21 DIAGNOSIS — E119 Type 2 diabetes mellitus without complications: Secondary | ICD-10-CM | POA: Diagnosis not present

## 2023-02-21 DIAGNOSIS — M25512 Pain in left shoulder: Secondary | ICD-10-CM | POA: Diagnosis not present

## 2023-02-21 DIAGNOSIS — K219 Gastro-esophageal reflux disease without esophagitis: Secondary | ICD-10-CM | POA: Diagnosis not present

## 2023-02-22 DIAGNOSIS — M25512 Pain in left shoulder: Secondary | ICD-10-CM | POA: Diagnosis not present

## 2023-02-22 DIAGNOSIS — I10 Essential (primary) hypertension: Secondary | ICD-10-CM | POA: Diagnosis not present

## 2023-02-22 DIAGNOSIS — F32A Depression, unspecified: Secondary | ICD-10-CM | POA: Diagnosis not present

## 2023-02-22 DIAGNOSIS — I69398 Other sequelae of cerebral infarction: Secondary | ICD-10-CM | POA: Diagnosis not present

## 2023-02-22 DIAGNOSIS — E119 Type 2 diabetes mellitus without complications: Secondary | ICD-10-CM | POA: Diagnosis not present

## 2023-02-22 DIAGNOSIS — G8929 Other chronic pain: Secondary | ICD-10-CM | POA: Diagnosis not present

## 2023-02-22 DIAGNOSIS — Z7902 Long term (current) use of antithrombotics/antiplatelets: Secondary | ICD-10-CM | POA: Diagnosis not present

## 2023-02-22 DIAGNOSIS — K219 Gastro-esophageal reflux disease without esophagitis: Secondary | ICD-10-CM | POA: Diagnosis not present

## 2023-02-22 DIAGNOSIS — R2689 Other abnormalities of gait and mobility: Secondary | ICD-10-CM | POA: Diagnosis not present

## 2023-02-26 DIAGNOSIS — I10 Essential (primary) hypertension: Secondary | ICD-10-CM | POA: Diagnosis not present

## 2023-02-26 DIAGNOSIS — Z7902 Long term (current) use of antithrombotics/antiplatelets: Secondary | ICD-10-CM | POA: Diagnosis not present

## 2023-02-26 DIAGNOSIS — R2689 Other abnormalities of gait and mobility: Secondary | ICD-10-CM | POA: Diagnosis not present

## 2023-02-26 DIAGNOSIS — G8929 Other chronic pain: Secondary | ICD-10-CM | POA: Diagnosis not present

## 2023-02-26 DIAGNOSIS — K219 Gastro-esophageal reflux disease without esophagitis: Secondary | ICD-10-CM | POA: Diagnosis not present

## 2023-02-26 DIAGNOSIS — M25512 Pain in left shoulder: Secondary | ICD-10-CM | POA: Diagnosis not present

## 2023-02-26 DIAGNOSIS — F32A Depression, unspecified: Secondary | ICD-10-CM | POA: Diagnosis not present

## 2023-02-26 DIAGNOSIS — I69398 Other sequelae of cerebral infarction: Secondary | ICD-10-CM | POA: Diagnosis not present

## 2023-02-26 DIAGNOSIS — E119 Type 2 diabetes mellitus without complications: Secondary | ICD-10-CM | POA: Diagnosis not present

## 2023-02-27 DIAGNOSIS — I10 Essential (primary) hypertension: Secondary | ICD-10-CM | POA: Diagnosis not present

## 2023-02-27 DIAGNOSIS — I69398 Other sequelae of cerebral infarction: Secondary | ICD-10-CM | POA: Diagnosis not present

## 2023-02-27 DIAGNOSIS — E119 Type 2 diabetes mellitus without complications: Secondary | ICD-10-CM | POA: Diagnosis not present

## 2023-02-27 DIAGNOSIS — K219 Gastro-esophageal reflux disease without esophagitis: Secondary | ICD-10-CM | POA: Diagnosis not present

## 2023-02-27 DIAGNOSIS — G8929 Other chronic pain: Secondary | ICD-10-CM | POA: Diagnosis not present

## 2023-02-27 DIAGNOSIS — R2689 Other abnormalities of gait and mobility: Secondary | ICD-10-CM | POA: Diagnosis not present

## 2023-02-27 DIAGNOSIS — M25512 Pain in left shoulder: Secondary | ICD-10-CM | POA: Diagnosis not present

## 2023-02-27 DIAGNOSIS — Z7902 Long term (current) use of antithrombotics/antiplatelets: Secondary | ICD-10-CM | POA: Diagnosis not present

## 2023-02-27 DIAGNOSIS — F32A Depression, unspecified: Secondary | ICD-10-CM | POA: Diagnosis not present

## 2023-02-28 ENCOUNTER — Other Ambulatory Visit: Payer: Self-pay | Admitting: Primary Care

## 2023-02-28 DIAGNOSIS — G8929 Other chronic pain: Secondary | ICD-10-CM | POA: Diagnosis not present

## 2023-02-28 DIAGNOSIS — R2689 Other abnormalities of gait and mobility: Secondary | ICD-10-CM | POA: Diagnosis not present

## 2023-02-28 DIAGNOSIS — K219 Gastro-esophageal reflux disease without esophagitis: Secondary | ICD-10-CM | POA: Diagnosis not present

## 2023-02-28 DIAGNOSIS — I10 Essential (primary) hypertension: Secondary | ICD-10-CM

## 2023-02-28 DIAGNOSIS — E119 Type 2 diabetes mellitus without complications: Secondary | ICD-10-CM | POA: Diagnosis not present

## 2023-02-28 DIAGNOSIS — E7849 Other hyperlipidemia: Secondary | ICD-10-CM

## 2023-02-28 DIAGNOSIS — Z7902 Long term (current) use of antithrombotics/antiplatelets: Secondary | ICD-10-CM | POA: Diagnosis not present

## 2023-02-28 DIAGNOSIS — I69398 Other sequelae of cerebral infarction: Secondary | ICD-10-CM | POA: Diagnosis not present

## 2023-02-28 DIAGNOSIS — M25512 Pain in left shoulder: Secondary | ICD-10-CM | POA: Diagnosis not present

## 2023-02-28 DIAGNOSIS — F32A Depression, unspecified: Secondary | ICD-10-CM | POA: Diagnosis not present

## 2023-03-01 DIAGNOSIS — E119 Type 2 diabetes mellitus without complications: Secondary | ICD-10-CM | POA: Diagnosis not present

## 2023-03-01 DIAGNOSIS — G8929 Other chronic pain: Secondary | ICD-10-CM | POA: Diagnosis not present

## 2023-03-01 DIAGNOSIS — I69398 Other sequelae of cerebral infarction: Secondary | ICD-10-CM | POA: Diagnosis not present

## 2023-03-01 DIAGNOSIS — F32A Depression, unspecified: Secondary | ICD-10-CM | POA: Diagnosis not present

## 2023-03-01 DIAGNOSIS — Z7902 Long term (current) use of antithrombotics/antiplatelets: Secondary | ICD-10-CM | POA: Diagnosis not present

## 2023-03-01 DIAGNOSIS — M25512 Pain in left shoulder: Secondary | ICD-10-CM | POA: Diagnosis not present

## 2023-03-01 DIAGNOSIS — K219 Gastro-esophageal reflux disease without esophagitis: Secondary | ICD-10-CM | POA: Diagnosis not present

## 2023-03-01 DIAGNOSIS — I10 Essential (primary) hypertension: Secondary | ICD-10-CM | POA: Diagnosis not present

## 2023-03-01 DIAGNOSIS — R2689 Other abnormalities of gait and mobility: Secondary | ICD-10-CM | POA: Diagnosis not present

## 2023-03-04 ENCOUNTER — Telehealth: Payer: Self-pay | Admitting: Primary Care

## 2023-03-04 DIAGNOSIS — G8929 Other chronic pain: Secondary | ICD-10-CM | POA: Diagnosis not present

## 2023-03-04 DIAGNOSIS — E119 Type 2 diabetes mellitus without complications: Secondary | ICD-10-CM | POA: Diagnosis not present

## 2023-03-04 DIAGNOSIS — I69398 Other sequelae of cerebral infarction: Secondary | ICD-10-CM | POA: Diagnosis not present

## 2023-03-04 DIAGNOSIS — M25512 Pain in left shoulder: Secondary | ICD-10-CM | POA: Diagnosis not present

## 2023-03-04 DIAGNOSIS — F32A Depression, unspecified: Secondary | ICD-10-CM | POA: Diagnosis not present

## 2023-03-04 DIAGNOSIS — K219 Gastro-esophageal reflux disease without esophagitis: Secondary | ICD-10-CM | POA: Diagnosis not present

## 2023-03-04 DIAGNOSIS — R2689 Other abnormalities of gait and mobility: Secondary | ICD-10-CM | POA: Diagnosis not present

## 2023-03-04 DIAGNOSIS — Z7902 Long term (current) use of antithrombotics/antiplatelets: Secondary | ICD-10-CM | POA: Diagnosis not present

## 2023-03-04 DIAGNOSIS — I10 Essential (primary) hypertension: Secondary | ICD-10-CM | POA: Diagnosis not present

## 2023-03-04 NOTE — Telephone Encounter (Signed)
Home Health verbal orders Caller Name: princess  Agency Name: [facility] number: [phone]  Requesting OT/PT/Skilled nursing/Social Work/Speech: medical social worker   Reason:  Frequency : eval, one week one. Social worker will call back with further orders   Please forward to [facility] pool or providers CMA

## 2023-03-05 DIAGNOSIS — I69398 Other sequelae of cerebral infarction: Secondary | ICD-10-CM | POA: Diagnosis not present

## 2023-03-05 DIAGNOSIS — I10 Essential (primary) hypertension: Secondary | ICD-10-CM | POA: Diagnosis not present

## 2023-03-05 DIAGNOSIS — K219 Gastro-esophageal reflux disease without esophagitis: Secondary | ICD-10-CM | POA: Diagnosis not present

## 2023-03-05 DIAGNOSIS — E119 Type 2 diabetes mellitus without complications: Secondary | ICD-10-CM | POA: Diagnosis not present

## 2023-03-05 DIAGNOSIS — G8929 Other chronic pain: Secondary | ICD-10-CM | POA: Diagnosis not present

## 2023-03-05 DIAGNOSIS — Z7902 Long term (current) use of antithrombotics/antiplatelets: Secondary | ICD-10-CM | POA: Diagnosis not present

## 2023-03-05 DIAGNOSIS — F32A Depression, unspecified: Secondary | ICD-10-CM | POA: Diagnosis not present

## 2023-03-05 DIAGNOSIS — R2689 Other abnormalities of gait and mobility: Secondary | ICD-10-CM | POA: Diagnosis not present

## 2023-03-05 DIAGNOSIS — M25512 Pain in left shoulder: Secondary | ICD-10-CM | POA: Diagnosis not present

## 2023-03-05 NOTE — Telephone Encounter (Signed)
Approved.  

## 2023-03-05 NOTE — Telephone Encounter (Signed)
Noted  

## 2023-03-05 NOTE — Telephone Encounter (Signed)
Called and advised [name] [name] with [facility] of the approval of the requested verbal orders for this patient. Advised to call back with any further questions.    [name] [name] wanted to make PCP aware that patient had a fall off of bed on Saturday. She reports there was no injury and he did well in therapy yesterday.

## 2023-03-06 DIAGNOSIS — R2689 Other abnormalities of gait and mobility: Secondary | ICD-10-CM | POA: Diagnosis not present

## 2023-03-06 DIAGNOSIS — I10 Essential (primary) hypertension: Secondary | ICD-10-CM | POA: Diagnosis not present

## 2023-03-06 DIAGNOSIS — M25512 Pain in left shoulder: Secondary | ICD-10-CM | POA: Diagnosis not present

## 2023-03-06 DIAGNOSIS — E119 Type 2 diabetes mellitus without complications: Secondary | ICD-10-CM | POA: Diagnosis not present

## 2023-03-06 DIAGNOSIS — F32A Depression, unspecified: Secondary | ICD-10-CM | POA: Diagnosis not present

## 2023-03-06 DIAGNOSIS — Z7902 Long term (current) use of antithrombotics/antiplatelets: Secondary | ICD-10-CM | POA: Diagnosis not present

## 2023-03-06 DIAGNOSIS — K219 Gastro-esophageal reflux disease without esophagitis: Secondary | ICD-10-CM | POA: Diagnosis not present

## 2023-03-06 DIAGNOSIS — G8929 Other chronic pain: Secondary | ICD-10-CM | POA: Diagnosis not present

## 2023-03-06 DIAGNOSIS — I69398 Other sequelae of cerebral infarction: Secondary | ICD-10-CM | POA: Diagnosis not present

## 2023-03-08 DIAGNOSIS — G8929 Other chronic pain: Secondary | ICD-10-CM | POA: Diagnosis not present

## 2023-03-08 DIAGNOSIS — K219 Gastro-esophageal reflux disease without esophagitis: Secondary | ICD-10-CM | POA: Diagnosis not present

## 2023-03-08 DIAGNOSIS — M25512 Pain in left shoulder: Secondary | ICD-10-CM | POA: Diagnosis not present

## 2023-03-08 DIAGNOSIS — I69398 Other sequelae of cerebral infarction: Secondary | ICD-10-CM | POA: Diagnosis not present

## 2023-03-08 DIAGNOSIS — Z7902 Long term (current) use of antithrombotics/antiplatelets: Secondary | ICD-10-CM | POA: Diagnosis not present

## 2023-03-08 DIAGNOSIS — E119 Type 2 diabetes mellitus without complications: Secondary | ICD-10-CM | POA: Diagnosis not present

## 2023-03-08 DIAGNOSIS — I10 Essential (primary) hypertension: Secondary | ICD-10-CM | POA: Diagnosis not present

## 2023-03-08 DIAGNOSIS — F32A Depression, unspecified: Secondary | ICD-10-CM | POA: Diagnosis not present

## 2023-03-08 DIAGNOSIS — R2689 Other abnormalities of gait and mobility: Secondary | ICD-10-CM | POA: Diagnosis not present

## 2023-03-11 DIAGNOSIS — I69398 Other sequelae of cerebral infarction: Secondary | ICD-10-CM | POA: Diagnosis not present

## 2023-03-11 DIAGNOSIS — M25512 Pain in left shoulder: Secondary | ICD-10-CM | POA: Diagnosis not present

## 2023-03-11 DIAGNOSIS — Z7902 Long term (current) use of antithrombotics/antiplatelets: Secondary | ICD-10-CM | POA: Diagnosis not present

## 2023-03-11 DIAGNOSIS — G8929 Other chronic pain: Secondary | ICD-10-CM | POA: Diagnosis not present

## 2023-03-11 DIAGNOSIS — R2689 Other abnormalities of gait and mobility: Secondary | ICD-10-CM | POA: Diagnosis not present

## 2023-03-11 DIAGNOSIS — F32A Depression, unspecified: Secondary | ICD-10-CM | POA: Diagnosis not present

## 2023-03-11 DIAGNOSIS — I10 Essential (primary) hypertension: Secondary | ICD-10-CM | POA: Diagnosis not present

## 2023-03-11 DIAGNOSIS — E119 Type 2 diabetes mellitus without complications: Secondary | ICD-10-CM | POA: Diagnosis not present

## 2023-03-11 DIAGNOSIS — K219 Gastro-esophageal reflux disease without esophagitis: Secondary | ICD-10-CM | POA: Diagnosis not present

## 2023-03-12 ENCOUNTER — Telehealth: Payer: Self-pay | Admitting: Nurse Practitioner

## 2023-03-12 NOTE — Telephone Encounter (Signed)
Form faxed, copied, and sent to scan.

## 2023-03-12 NOTE — Telephone Encounter (Signed)
In the absence of Allie Bossier, NP. I signed the home health certification orders. They have been placed in the box to be faxed

## 2023-03-13 DIAGNOSIS — Z7902 Long term (current) use of antithrombotics/antiplatelets: Secondary | ICD-10-CM | POA: Diagnosis not present

## 2023-03-13 DIAGNOSIS — R2689 Other abnormalities of gait and mobility: Secondary | ICD-10-CM | POA: Diagnosis not present

## 2023-03-13 DIAGNOSIS — I69398 Other sequelae of cerebral infarction: Secondary | ICD-10-CM | POA: Diagnosis not present

## 2023-03-13 DIAGNOSIS — E119 Type 2 diabetes mellitus without complications: Secondary | ICD-10-CM | POA: Diagnosis not present

## 2023-03-13 DIAGNOSIS — I10 Essential (primary) hypertension: Secondary | ICD-10-CM | POA: Diagnosis not present

## 2023-03-13 DIAGNOSIS — M25512 Pain in left shoulder: Secondary | ICD-10-CM | POA: Diagnosis not present

## 2023-03-13 DIAGNOSIS — K219 Gastro-esophageal reflux disease without esophagitis: Secondary | ICD-10-CM | POA: Diagnosis not present

## 2023-03-13 DIAGNOSIS — G8929 Other chronic pain: Secondary | ICD-10-CM | POA: Diagnosis not present

## 2023-03-13 DIAGNOSIS — F32A Depression, unspecified: Secondary | ICD-10-CM | POA: Diagnosis not present

## 2023-03-14 DIAGNOSIS — G8929 Other chronic pain: Secondary | ICD-10-CM | POA: Diagnosis not present

## 2023-03-14 DIAGNOSIS — I10 Essential (primary) hypertension: Secondary | ICD-10-CM | POA: Diagnosis not present

## 2023-03-14 DIAGNOSIS — E119 Type 2 diabetes mellitus without complications: Secondary | ICD-10-CM | POA: Diagnosis not present

## 2023-03-14 DIAGNOSIS — K219 Gastro-esophageal reflux disease without esophagitis: Secondary | ICD-10-CM | POA: Diagnosis not present

## 2023-03-14 DIAGNOSIS — F32A Depression, unspecified: Secondary | ICD-10-CM | POA: Diagnosis not present

## 2023-03-14 DIAGNOSIS — M25512 Pain in left shoulder: Secondary | ICD-10-CM | POA: Diagnosis not present

## 2023-03-14 DIAGNOSIS — I69398 Other sequelae of cerebral infarction: Secondary | ICD-10-CM | POA: Diagnosis not present

## 2023-03-14 DIAGNOSIS — Z7902 Long term (current) use of antithrombotics/antiplatelets: Secondary | ICD-10-CM | POA: Diagnosis not present

## 2023-03-14 DIAGNOSIS — R2689 Other abnormalities of gait and mobility: Secondary | ICD-10-CM | POA: Diagnosis not present

## 2023-03-19 DIAGNOSIS — I10 Essential (primary) hypertension: Secondary | ICD-10-CM | POA: Diagnosis not present

## 2023-03-19 DIAGNOSIS — R2689 Other abnormalities of gait and mobility: Secondary | ICD-10-CM | POA: Diagnosis not present

## 2023-03-19 DIAGNOSIS — E119 Type 2 diabetes mellitus without complications: Secondary | ICD-10-CM | POA: Diagnosis not present

## 2023-03-19 DIAGNOSIS — M25512 Pain in left shoulder: Secondary | ICD-10-CM | POA: Diagnosis not present

## 2023-03-19 DIAGNOSIS — K219 Gastro-esophageal reflux disease without esophagitis: Secondary | ICD-10-CM | POA: Diagnosis not present

## 2023-03-19 DIAGNOSIS — Z7902 Long term (current) use of antithrombotics/antiplatelets: Secondary | ICD-10-CM | POA: Diagnosis not present

## 2023-03-19 DIAGNOSIS — F32A Depression, unspecified: Secondary | ICD-10-CM | POA: Diagnosis not present

## 2023-03-19 DIAGNOSIS — G8929 Other chronic pain: Secondary | ICD-10-CM | POA: Diagnosis not present

## 2023-03-19 DIAGNOSIS — I69398 Other sequelae of cerebral infarction: Secondary | ICD-10-CM | POA: Diagnosis not present

## 2023-03-20 DIAGNOSIS — K219 Gastro-esophageal reflux disease without esophagitis: Secondary | ICD-10-CM | POA: Diagnosis not present

## 2023-03-20 DIAGNOSIS — Z7902 Long term (current) use of antithrombotics/antiplatelets: Secondary | ICD-10-CM | POA: Diagnosis not present

## 2023-03-20 DIAGNOSIS — E119 Type 2 diabetes mellitus without complications: Secondary | ICD-10-CM | POA: Diagnosis not present

## 2023-03-20 DIAGNOSIS — G8929 Other chronic pain: Secondary | ICD-10-CM | POA: Diagnosis not present

## 2023-03-20 DIAGNOSIS — M25512 Pain in left shoulder: Secondary | ICD-10-CM | POA: Diagnosis not present

## 2023-03-20 DIAGNOSIS — F32A Depression, unspecified: Secondary | ICD-10-CM | POA: Diagnosis not present

## 2023-03-20 DIAGNOSIS — I69398 Other sequelae of cerebral infarction: Secondary | ICD-10-CM | POA: Diagnosis not present

## 2023-03-20 DIAGNOSIS — I10 Essential (primary) hypertension: Secondary | ICD-10-CM | POA: Diagnosis not present

## 2023-03-20 DIAGNOSIS — R2689 Other abnormalities of gait and mobility: Secondary | ICD-10-CM | POA: Diagnosis not present

## 2023-03-26 DIAGNOSIS — I69398 Other sequelae of cerebral infarction: Secondary | ICD-10-CM | POA: Diagnosis not present

## 2023-03-26 DIAGNOSIS — F32A Depression, unspecified: Secondary | ICD-10-CM | POA: Diagnosis not present

## 2023-03-26 DIAGNOSIS — K219 Gastro-esophageal reflux disease without esophagitis: Secondary | ICD-10-CM | POA: Diagnosis not present

## 2023-03-26 DIAGNOSIS — I10 Essential (primary) hypertension: Secondary | ICD-10-CM | POA: Diagnosis not present

## 2023-03-26 DIAGNOSIS — M25512 Pain in left shoulder: Secondary | ICD-10-CM | POA: Diagnosis not present

## 2023-03-26 DIAGNOSIS — Z7902 Long term (current) use of antithrombotics/antiplatelets: Secondary | ICD-10-CM | POA: Diagnosis not present

## 2023-03-26 DIAGNOSIS — E119 Type 2 diabetes mellitus without complications: Secondary | ICD-10-CM | POA: Diagnosis not present

## 2023-03-26 DIAGNOSIS — G8929 Other chronic pain: Secondary | ICD-10-CM | POA: Diagnosis not present

## 2023-03-26 DIAGNOSIS — R2689 Other abnormalities of gait and mobility: Secondary | ICD-10-CM | POA: Diagnosis not present

## 2023-03-28 DIAGNOSIS — M25512 Pain in left shoulder: Secondary | ICD-10-CM | POA: Diagnosis not present

## 2023-03-28 DIAGNOSIS — E119 Type 2 diabetes mellitus without complications: Secondary | ICD-10-CM | POA: Diagnosis not present

## 2023-03-28 DIAGNOSIS — I10 Essential (primary) hypertension: Secondary | ICD-10-CM | POA: Diagnosis not present

## 2023-03-28 DIAGNOSIS — Z7902 Long term (current) use of antithrombotics/antiplatelets: Secondary | ICD-10-CM | POA: Diagnosis not present

## 2023-03-28 DIAGNOSIS — K219 Gastro-esophageal reflux disease without esophagitis: Secondary | ICD-10-CM | POA: Diagnosis not present

## 2023-03-28 DIAGNOSIS — F32A Depression, unspecified: Secondary | ICD-10-CM | POA: Diagnosis not present

## 2023-03-28 DIAGNOSIS — I69398 Other sequelae of cerebral infarction: Secondary | ICD-10-CM | POA: Diagnosis not present

## 2023-03-28 DIAGNOSIS — G8929 Other chronic pain: Secondary | ICD-10-CM | POA: Diagnosis not present

## 2023-03-28 DIAGNOSIS — R2689 Other abnormalities of gait and mobility: Secondary | ICD-10-CM | POA: Diagnosis not present

## 2023-04-01 DIAGNOSIS — F32A Depression, unspecified: Secondary | ICD-10-CM | POA: Diagnosis not present

## 2023-04-01 DIAGNOSIS — M25512 Pain in left shoulder: Secondary | ICD-10-CM | POA: Diagnosis not present

## 2023-04-01 DIAGNOSIS — R2689 Other abnormalities of gait and mobility: Secondary | ICD-10-CM | POA: Diagnosis not present

## 2023-04-01 DIAGNOSIS — G8929 Other chronic pain: Secondary | ICD-10-CM | POA: Diagnosis not present

## 2023-04-01 DIAGNOSIS — K219 Gastro-esophageal reflux disease without esophagitis: Secondary | ICD-10-CM | POA: Diagnosis not present

## 2023-04-01 DIAGNOSIS — I10 Essential (primary) hypertension: Secondary | ICD-10-CM | POA: Diagnosis not present

## 2023-04-01 DIAGNOSIS — I69398 Other sequelae of cerebral infarction: Secondary | ICD-10-CM | POA: Diagnosis not present

## 2023-04-01 DIAGNOSIS — E119 Type 2 diabetes mellitus without complications: Secondary | ICD-10-CM | POA: Diagnosis not present

## 2023-04-01 DIAGNOSIS — Z7902 Long term (current) use of antithrombotics/antiplatelets: Secondary | ICD-10-CM | POA: Diagnosis not present

## 2023-04-04 DIAGNOSIS — Z7902 Long term (current) use of antithrombotics/antiplatelets: Secondary | ICD-10-CM | POA: Diagnosis not present

## 2023-04-04 DIAGNOSIS — I10 Essential (primary) hypertension: Secondary | ICD-10-CM | POA: Diagnosis not present

## 2023-04-04 DIAGNOSIS — K219 Gastro-esophageal reflux disease without esophagitis: Secondary | ICD-10-CM | POA: Diagnosis not present

## 2023-04-04 DIAGNOSIS — F32A Depression, unspecified: Secondary | ICD-10-CM | POA: Diagnosis not present

## 2023-04-04 DIAGNOSIS — E119 Type 2 diabetes mellitus without complications: Secondary | ICD-10-CM | POA: Diagnosis not present

## 2023-04-04 DIAGNOSIS — G8929 Other chronic pain: Secondary | ICD-10-CM | POA: Diagnosis not present

## 2023-04-04 DIAGNOSIS — M25512 Pain in left shoulder: Secondary | ICD-10-CM | POA: Diagnosis not present

## 2023-04-04 DIAGNOSIS — I69398 Other sequelae of cerebral infarction: Secondary | ICD-10-CM | POA: Diagnosis not present

## 2023-04-04 DIAGNOSIS — R2689 Other abnormalities of gait and mobility: Secondary | ICD-10-CM | POA: Diagnosis not present

## 2023-04-05 DIAGNOSIS — G8929 Other chronic pain: Secondary | ICD-10-CM | POA: Diagnosis not present

## 2023-04-05 DIAGNOSIS — M25512 Pain in left shoulder: Secondary | ICD-10-CM | POA: Diagnosis not present

## 2023-04-05 DIAGNOSIS — R2689 Other abnormalities of gait and mobility: Secondary | ICD-10-CM | POA: Diagnosis not present

## 2023-04-05 DIAGNOSIS — E119 Type 2 diabetes mellitus without complications: Secondary | ICD-10-CM | POA: Diagnosis not present

## 2023-04-05 DIAGNOSIS — I10 Essential (primary) hypertension: Secondary | ICD-10-CM | POA: Diagnosis not present

## 2023-04-05 DIAGNOSIS — F32A Depression, unspecified: Secondary | ICD-10-CM | POA: Diagnosis not present

## 2023-04-05 DIAGNOSIS — I69398 Other sequelae of cerebral infarction: Secondary | ICD-10-CM | POA: Diagnosis not present

## 2023-04-05 DIAGNOSIS — K219 Gastro-esophageal reflux disease without esophagitis: Secondary | ICD-10-CM | POA: Diagnosis not present

## 2023-04-05 DIAGNOSIS — Z7902 Long term (current) use of antithrombotics/antiplatelets: Secondary | ICD-10-CM | POA: Diagnosis not present

## 2023-04-09 DIAGNOSIS — R2689 Other abnormalities of gait and mobility: Secondary | ICD-10-CM | POA: Diagnosis not present

## 2023-04-09 DIAGNOSIS — I69398 Other sequelae of cerebral infarction: Secondary | ICD-10-CM | POA: Diagnosis not present

## 2023-04-09 DIAGNOSIS — M25512 Pain in left shoulder: Secondary | ICD-10-CM | POA: Diagnosis not present

## 2023-04-09 DIAGNOSIS — E119 Type 2 diabetes mellitus without complications: Secondary | ICD-10-CM | POA: Diagnosis not present

## 2023-04-09 DIAGNOSIS — F32A Depression, unspecified: Secondary | ICD-10-CM | POA: Diagnosis not present

## 2023-04-09 DIAGNOSIS — Z7902 Long term (current) use of antithrombotics/antiplatelets: Secondary | ICD-10-CM | POA: Diagnosis not present

## 2023-04-09 DIAGNOSIS — K219 Gastro-esophageal reflux disease without esophagitis: Secondary | ICD-10-CM | POA: Diagnosis not present

## 2023-04-09 DIAGNOSIS — G8929 Other chronic pain: Secondary | ICD-10-CM | POA: Diagnosis not present

## 2023-04-09 DIAGNOSIS — I10 Essential (primary) hypertension: Secondary | ICD-10-CM | POA: Diagnosis not present

## 2023-04-10 DIAGNOSIS — E119 Type 2 diabetes mellitus without complications: Secondary | ICD-10-CM | POA: Diagnosis not present

## 2023-04-10 DIAGNOSIS — Z7902 Long term (current) use of antithrombotics/antiplatelets: Secondary | ICD-10-CM | POA: Diagnosis not present

## 2023-04-10 DIAGNOSIS — R2689 Other abnormalities of gait and mobility: Secondary | ICD-10-CM | POA: Diagnosis not present

## 2023-04-10 DIAGNOSIS — M25512 Pain in left shoulder: Secondary | ICD-10-CM | POA: Diagnosis not present

## 2023-04-10 DIAGNOSIS — I69398 Other sequelae of cerebral infarction: Secondary | ICD-10-CM | POA: Diagnosis not present

## 2023-04-10 DIAGNOSIS — I10 Essential (primary) hypertension: Secondary | ICD-10-CM | POA: Diagnosis not present

## 2023-04-10 DIAGNOSIS — K219 Gastro-esophageal reflux disease without esophagitis: Secondary | ICD-10-CM | POA: Diagnosis not present

## 2023-04-10 DIAGNOSIS — G8929 Other chronic pain: Secondary | ICD-10-CM | POA: Diagnosis not present

## 2023-04-10 DIAGNOSIS — F32A Depression, unspecified: Secondary | ICD-10-CM | POA: Diagnosis not present

## 2023-04-11 DIAGNOSIS — K219 Gastro-esophageal reflux disease without esophagitis: Secondary | ICD-10-CM | POA: Diagnosis not present

## 2023-04-11 DIAGNOSIS — G8929 Other chronic pain: Secondary | ICD-10-CM | POA: Diagnosis not present

## 2023-04-11 DIAGNOSIS — I10 Essential (primary) hypertension: Secondary | ICD-10-CM | POA: Diagnosis not present

## 2023-04-11 DIAGNOSIS — Z7902 Long term (current) use of antithrombotics/antiplatelets: Secondary | ICD-10-CM | POA: Diagnosis not present

## 2023-04-11 DIAGNOSIS — M25512 Pain in left shoulder: Secondary | ICD-10-CM | POA: Diagnosis not present

## 2023-04-11 DIAGNOSIS — R2689 Other abnormalities of gait and mobility: Secondary | ICD-10-CM | POA: Diagnosis not present

## 2023-04-11 DIAGNOSIS — I69398 Other sequelae of cerebral infarction: Secondary | ICD-10-CM | POA: Diagnosis not present

## 2023-04-11 DIAGNOSIS — F32A Depression, unspecified: Secondary | ICD-10-CM | POA: Diagnosis not present

## 2023-04-11 DIAGNOSIS — E119 Type 2 diabetes mellitus without complications: Secondary | ICD-10-CM | POA: Diagnosis not present

## 2023-04-19 ENCOUNTER — Other Ambulatory Visit: Payer: Self-pay | Admitting: Primary Care

## 2023-04-19 DIAGNOSIS — Z8673 Personal history of transient ischemic attack (TIA), and cerebral infarction without residual deficits: Secondary | ICD-10-CM

## 2023-04-29 ENCOUNTER — Telehealth: Payer: Self-pay | Admitting: Primary Care

## 2023-04-29 NOTE — Telephone Encounter (Signed)
Can we get some additional information?

## 2023-04-29 NOTE — Telephone Encounter (Signed)
Pt wife Dwana Curd called in wants to can pt use a stimulator on his shoulder and arm . Please advise [phone]

## 2023-04-30 NOTE — Telephone Encounter (Signed)
Called patients wife and reviewed all information. She verbalized understanding. Will call if any further questions.

## 2023-04-30 NOTE — Telephone Encounter (Signed)
Called patients wife and she states the patients left arm has been bothering him since August, he is having a hard time lifting his arm up due to the pain the shoulder and arm. Patient has appt with Dr. Margarita Rana with ortho for shoulder pain tomorrow. Patients wife has a friend that had severe shoulder pain after a stroke and she used a stimulator and was able to get some relief. Patients wife is wanting to know if it would be okay to use the [employer] muscle stimulator on the patient? Advised she should also ask the Orthopedic tomorrow at patients appt. Please advise

## 2023-04-30 NOTE — Telephone Encounter (Signed)
He can try it. But I recommend discontinuation if this causes him increased pain. Follow up with ortho as scheduled.

## 2023-05-01 DIAGNOSIS — M7552 Bursitis of left shoulder: Secondary | ICD-10-CM | POA: Diagnosis not present

## 2023-05-01 DIAGNOSIS — M7582 Other shoulder lesions, left shoulder: Secondary | ICD-10-CM | POA: Diagnosis not present

## 2023-05-08 ENCOUNTER — Ambulatory Visit: Payer: Medicare PPO | Admitting: Podiatry

## 2023-05-08 ENCOUNTER — Encounter: Payer: Self-pay | Admitting: Podiatry

## 2023-05-08 DIAGNOSIS — E1165 Type 2 diabetes mellitus with hyperglycemia: Secondary | ICD-10-CM

## 2023-05-08 DIAGNOSIS — M79675 Pain in left toe(s): Secondary | ICD-10-CM

## 2023-05-08 DIAGNOSIS — B351 Tinea unguium: Secondary | ICD-10-CM | POA: Diagnosis not present

## 2023-05-08 DIAGNOSIS — M79674 Pain in right toe(s): Secondary | ICD-10-CM | POA: Diagnosis not present

## 2023-05-08 NOTE — Progress Notes (Signed)
This patient presents  to my office for at risk foot care.  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 Patient has history of CVA. This patient  takes plavix.This patient is unable to cut nails himself since the patient cannot reach his nails.These nails are painful walking and wearing shoes.  This patient presents for at risk foot care today. ? ?General Appearance  Alert, conversant and in no acute stress. ? ?Vascular  Dorsalis pedis and posterior tibial  pulses are weakly  palpable  bilaterally.  Capillary return is within normal limits  bilaterally. Temperature is within normal limits  bilaterally. Swelling persists in feet.  ? ?Neurologic  Senn-Weinstein monofilament wire test within normal limits  bilaterally. Muscle power within normal limits bilaterally. ? ?Nails Thick disfigured discolored nails with subungual debris  from hallux to fifth toes bilaterally. No evidence of bacterial infection or drainage bilaterally. ? ?Orthopedic  No limitations of motion  feet .  No crepitus or effusions noted.  No bony pathology or digital deformities noted. ? ?Skin  normotropic skin with no porokeratosis noted bilaterally.  No signs of infections or ulcers noted.    ? ?Onychomycosis  Pain in right toes  Pain in left toes ? ?Consent was obtained for treatment procedures.  Mechanical debridement of nails 1-5  bilaterally performed with a nail nipper.  Filed with dremel without incident.  ? ? ?Return office visit    3 months                  Told patient to return for periodic foot care and evaluation due to potential at risk complications. ? ? ?Salome Cozby DPM   ?

## 2023-05-09 DIAGNOSIS — M19012 Primary osteoarthritis, left shoulder: Secondary | ICD-10-CM | POA: Diagnosis not present

## 2023-05-14 ENCOUNTER — Telehealth: Payer: Self-pay | Admitting: Primary Care

## 2023-05-14 NOTE — Telephone Encounter (Signed)
Patients wife called in stating patient is on a thickener for liquids diet that he has been on for over a year now. She said that he usually has solid bowel movements. But for the last 2 or 3 months they have been in diarrhea form.She is worried that he may have IBS. She would like to know if he can possibly be prescribed any medication to help with this? She said that since he has had the 2 strokes it difficult for her to bring him in to be evaluated. Please advise

## 2023-05-15 NOTE — Telephone Encounter (Signed)
Called and spoke with patients wife. Advised of [employer]. Patients wife thanked me for the information and declined office visit at this time.

## 2023-05-15 NOTE — Telephone Encounter (Signed)
He will need further evaluation for the symptoms as they are not his norm.  His wife could call the nutritionist to see if this is a common side effect from the thickener, otherwise will need office visit.

## 2023-05-16 DIAGNOSIS — M19012 Primary osteoarthritis, left shoulder: Secondary | ICD-10-CM | POA: Diagnosis not present

## 2023-05-23 DIAGNOSIS — M19012 Primary osteoarthritis, left shoulder: Secondary | ICD-10-CM | POA: Diagnosis not present

## 2023-05-24 ENCOUNTER — Telehealth: Payer: Self-pay | Admitting: Primary Care

## 2023-05-24 NOTE — Telephone Encounter (Addendum)
Called and advised patients wife of [name] [name] message, moved patients appointment to 05/29/23.

## 2023-05-24 NOTE — Telephone Encounter (Signed)
Unable to reach patient. Left voicemail to return call to our office.   

## 2023-05-24 NOTE — Telephone Encounter (Signed)
Patient's wife contacted the office regarding her husband. States he is still having some stomach issues and his having quite a few bowel movements everyday. Wife has checked with simply thick nutritionist as directed by Jae Dire and was told there is nothing in those ingredients that may cause this issue to happen. Scheduled patient for appt on 5/30, wife asked if I could send a message to Jae Dire and see if there is anything she recommends or can prescribe for this issue? Please advise, thank you.

## 2023-05-24 NOTE — Telephone Encounter (Signed)
We need to meet to discuss his symptoms in greater detail.  We need to make sure that he does not have an intestinal infection or infection in the stools.  We can discuss further at our upcoming visit.

## 2023-05-25 ENCOUNTER — Other Ambulatory Visit: Payer: Self-pay | Admitting: Primary Care

## 2023-05-25 DIAGNOSIS — E7849 Other hyperlipidemia: Secondary | ICD-10-CM

## 2023-05-25 DIAGNOSIS — I10 Essential (primary) hypertension: Secondary | ICD-10-CM

## 2023-05-29 ENCOUNTER — Encounter: Payer: Self-pay | Admitting: Primary Care

## 2023-05-29 ENCOUNTER — Ambulatory Visit: Payer: Medicare PPO | Admitting: Primary Care

## 2023-05-29 VITALS — BP 114/60 | HR 82 | Temp 97.5°F | Ht 72.0 in | Wt 214.0 lb

## 2023-05-29 DIAGNOSIS — K529 Noninfective gastroenteritis and colitis, unspecified: Secondary | ICD-10-CM | POA: Diagnosis not present

## 2023-05-29 DIAGNOSIS — E1165 Type 2 diabetes mellitus with hyperglycemia: Secondary | ICD-10-CM | POA: Diagnosis not present

## 2023-05-29 NOTE — Patient Instructions (Signed)
Stop by the lab prior to leaving today. I will notify you of your results once received.   Increase dietary intake of fiber.  Consider a fiber supplement or stool bulking agent supplement.  You can find these over-the-counter.  It was a pleasure to see you today!

## 2023-05-29 NOTE — Progress Notes (Signed)
Subjective:    Patient ID: [name] [name], male    DOB: Apr 21, 1941, 82 y.o.   MRN: [id number]  HPI  [name] [name] is a very pleasant 82 y.o. male with a history of CVA, unsteady gait, type 2 diabetes, hypertension, anxiety who presents today to discuss bowel changes.  His wife and daughter join [place] today.  His wife contacted our office on 05/14/2023 with reports of a 2 to 34-month history of changes in bowel movement consistency to loose stools.  She was concerned about IBS and requested medication treatment.  Given his symptoms, lack of prior IBS history, and prior medical history it was advised he come to the office for evaluation.  Today his wife discusses a chronic history of urgency with bowel movements without incontinence.  Historically his stools have been softer since his stroke.  Over the last 2-3 months he's noticed changes in stools, having loose stools with urgency and incontinence of stool. He often cannot make it to the bathroom.  He wears depends throughout the day.  He is having loose stools ranging 0-5 times daily, mostly 2-3 times daily for the last 2-3 months. He denies changes in food, activity level, medications, supplements. His appetite has also declined.   Diet currently consists of:  Breakfast: Cookies and coffee Lunch: Mostly nothing, sometimes snacks.  Dinner: Home cooked meal.  Snacks: Nuts sometimes Desserts: Candy bars, cookies  Beverages: Coffee, Boost  He denies bloody stools, recent antibiotic use, fever/chills, abdominal pain, bloody stools, nausea, vomiting. He has been on a special thickener for liquids for greater than 1 year since his last stroke.  He contacted his nutritionist who confirmed that his thickener was not causing his symptoms.   BP Readings from Last 3 Encounters:  05/29/23 114/60  02/04/23 126/77  09/06/22 108/67   Wt Readings from Last 3 Encounters:  05/29/23 214 lb (97.1 kg)  02/12/23 213 lb (96.6 kg)  02/04/23 213 lb  (96.6 kg)     Review of Systems  Constitutional:  Positive for appetite change. Negative for chills, fever and unexpected weight change.  Gastrointestinal:  Positive for diarrhea. Negative for abdominal pain, blood in stool, nausea and vomiting.         Past Medical History:  Diagnosis Date   Basal cell carcinoma 11/09/2019   nod & infil-behind right ear-cx3 &exc   Basal cell carcinoma 03/21/2020   Residual BCC with peripheral margin involved - ST recommends MOHs   Carotid artery occlusion    Coronary artery disease    s/p CABG 2005; sees Dr Eden Emms yearly   Diabetes mellitus without complication (HCC)    Gynecomastia    History of COVID-19 01/08/2022   Hypercholesterolemia    Hypertension    Neurodermatitis    Overweight(278.02)    Personal history of colonic polyps 10/08/2006   tubular adenomas   Renal insufficiency    Stroke [facility])    TIA  April 2022   Transient ischemic attack 2010   "lasted ~ 5 seconds"   Vitamin D deficiency     Social History   Socioeconomic History   Marital status: Married    Spouse name: [name]   Number of children: 1   Years of education: Not on file   Highest education level: Some college, no degree  Occupational History   Occupation: retired  Tobacco Use   Smoking status: Never   Smokeless tobacco: Former  Building services engineer Use: Never used  Substance and Sexual Activity  Alcohol use: No    Alcohol/week: 0.0 standard drinks of alcohol   Drug use: No   Sexual activity: Yes  Other Topics Concern   Not on file  Social History Narrative   Retired.   Once worked for the [employer].   Married.   Enjoys reading, spending time with family.    Left handed   Drinks caffeine   One story home   Social Determinants of Health   Financial Resource Strain: Low Risk  (06/14/2022)   Overall Financial Resource Strain (CARDIA)    Difficulty of Paying Living Expenses: Not hard at all  Food Insecurity: No Food Insecurity (06/14/2022)   Hunger Vital  Sign    Worried About Running Out of Food in the Last Year: Never true    Ran Out of Food in the Last Year: Never true  Transportation Needs: No Transportation Needs (06/14/2022)   PRAPARE - Administrator, Civil Service (Medical): No    Lack of Transportation (Non-Medical): No  Physical Activity: Insufficiently Active (06/14/2022)   Exercise Vital Sign    Days of Exercise per Week: 3 days    Minutes of Exercise per Session: 30 min  Stress: No Stress Concern Present (06/14/2022)   [employer] of Occupational Health - Occupational Stress Questionnaire    Feeling of Stress : Not at all  Social Connections: Moderately Isolated (06/14/2022)   Social Connection and Isolation Panel [NHANES]    Frequency of Communication with Friends and Family: More than three times a week    Frequency of Social Gatherings with Friends and Family: Once a week    Attends Religious Services: Never    Database administrator or Organizations: No    Attends Banker Meetings: Never    Marital Status: Married  Catering manager Violence: Not At Risk (06/14/2022)   Humiliation, Afraid, Rape, and Kick questionnaire    Fear of Current or Ex-Partner: No    Emotionally Abused: No    Physically Abused: No    Sexually Abused: No    Past Surgical History:  Procedure Laterality Date   CARDIAC CATHETERIZATION  02/2004   "tried to stent; couldn't"   CAROTID ENDARTERECTOMY Right 08/26/2015   COLONOSCOPY     CORONARY ANGIOPLASTY     CORONARY ARTERY BYPASS GRAFT  Feb. 2005   4 vessel   ENDARTERECTOMY Right 08/26/2015   Procedure: Right Carotid ENDARTERECTOMY with Patch Angioplasty ;  Surgeon: Larina Earthly, MD;  Location: [facility] OR;  Service: Vascular;  Laterality: Right;   ENDARTERECTOMY Left 05/18/2021   Procedure: LEFT CAROTID ARTERY ENDARTERECTOMY  with patch angioplasty;  Surgeon: Larina Earthly, MD;  Location: [facility] OR;  Service: Vascular;  Laterality: Left;   EYE SURGERY     bilateral cataract    KELOID EXCISION  04/2008   on chest scar; Dr. Stephens November   KELOID EXCISION     PILONIDAL CYST EXCISION  1989    Family History  Problem Relation Age of Onset   Heart disease Mother        Before age 1   Diabetes Mother    Kidney disease Mother    Heart attack Mother 64   Lung cancer Father 59   Diabetes Brother    Heart disease Brother    Heart disease Brother    Arthritis Brother    Diabetes Sister    Fibromyalgia Sister    Lung cancer Paternal Uncle        questionable as to  if it was lung ca   Healthy Daughter    Colon cancer Neg Hx    Stroke Neg Hx     Allergies  Allergen Reactions   Niacin Other (See Comments)    intol to NIACIN w/ headaches    Ativan [Lorazepam] Other (See Comments)    agitation    Current Outpatient Medications on File Prior to Visit  Medication Sig Dispense Refill   atorvastatin (LIPITOR) 10 MG tablet TAKE 1 TABLET BY MOUTH EVERY DAY FOR CHOLESTEROL 90 tablet 0   cholecalciferol (VITAMIN D3) 25 MCG (1000 UNIT) tablet Take 1,000 Units by mouth daily.     clopidogrel (PLAVIX) 75 MG tablet TAKE 1 TABLET BY MOUTH EVERY DAY 90 tablet 0   cyanocobalamin (VITAMIN B12) 500 MCG tablet Take 500 mcg by mouth daily.     glucose blood (ACCU-CHEK GUIDE) test strip USE AS DIRECTED TO TEST BLOOD SUGAR UP TO 4 TIMES DAILY 100 strip 3   hydrochlorothiazide (HYDRODIURIL) 12.5 MG tablet TAKE 1 TABLET (12.5 MG TOTAL) BY MOUTH DAILY FOR BLOOD PRESSURE 90 tablet 0   losartan (COZAAR) 25 MG tablet TAKE 2 TABLETS (50 MG TOTAL) BY MOUTH DAILY. FOR BLOOD PRESSURE. 180 tablet 0   nitroGLYCERIN (NITROSTAT) 0.4 MG SL tablet Place 1 tablet (0.4 mg total) under the tongue every 5 (five) minutes as needed for chest pain. 25 tablet 3   No current facility-administered medications on file prior to visit.    BP 114/60   Pulse 82   Temp (!) 97.5 F (36.4 C) (Temporal)   Ht 6' (1.829 m)   Wt 214 lb (97.1 kg)   SpO2 96%   BMI 29.02 kg/m  Objective:   Physical  Exam Constitutional:      General: He is not in acute distress. Cardiovascular:     Rate and Rhythm: Normal rate.  Abdominal:     General: Bowel sounds are normal.     Palpations: Abdomen is soft.     Tenderness: There is no abdominal tenderness.  Skin:    General: Skin is warm and dry.  Neurological:     Mental Status: He is alert and oriented to person, place, and time.           Assessment & Plan:  Chronic diarrhea Assessment & Plan: Unclear cause.  Will evaluate with stool studies, fecal occult card, calprotectin, CBC with differential, thyroid studies.  Discussed to increase dietary and/or supplemental fiber and stool bulking agents.  Await results.   Orders: -     TSH -     T4, free -     CBC with Differential/Platelet -     C. difficile GDH and Toxin A/B; Future -     Calprotectin, Fecal; Future -     Fecal occult blood, imunochemical; Future -     Gastrointestinal Pathogen Pnl RT, PCR; Future -     Giardia antigen; Future  Type 2 diabetes mellitus with hyperglycemia, without long-term current use of insulin (HCC) Assessment & Plan: Repeat A1c pending.  Orders: -     Hemoglobin A1c        Doreene Nest, NP

## 2023-05-29 NOTE — Assessment & Plan Note (Signed)
Repeat A1c pending. 

## 2023-05-29 NOTE — Assessment & Plan Note (Signed)
Unclear cause.  Will evaluate with stool studies, fecal occult card, calprotectin, CBC with differential, thyroid studies.  Discussed to increase dietary and/or supplemental fiber and stool bulking agents.  Await results.

## 2023-05-30 ENCOUNTER — Other Ambulatory Visit (INDEPENDENT_AMBULATORY_CARE_PROVIDER_SITE_OTHER): Payer: Medicare PPO

## 2023-05-30 ENCOUNTER — Encounter: Payer: Self-pay | Admitting: Radiology

## 2023-05-30 ENCOUNTER — Ambulatory Visit: Payer: Medicare PPO | Admitting: Primary Care

## 2023-05-30 DIAGNOSIS — D7589 Other specified diseases of blood and blood-forming organs: Secondary | ICD-10-CM

## 2023-05-30 LAB — CBC WITH DIFFERENTIAL/PLATELET
Basophils Absolute: 0.1 10*3/uL (ref 0.0–0.1)
Basophils Relative: 0.5 % (ref 0.0–3.0)
Eosinophils Absolute: 0.1 10*3/uL (ref 0.0–0.7)
Eosinophils Relative: 0.8 % (ref 0.0–5.0)
HCT: 44.5 % (ref 39.0–52.0)
Hemoglobin: 15 g/dL (ref 13.0–17.0)
Lymphocytes Relative: 13.6 % (ref 12.0–46.0)
Lymphs Abs: 1.4 10*3/uL (ref 0.7–4.0)
MCHC: 33.8 g/dL (ref 30.0–36.0)
MCV: 104.1 fl — ABNORMAL HIGH (ref 78.0–100.0)
Monocytes Absolute: 0.8 10*3/uL (ref 0.1–1.0)
Monocytes Relative: 7.7 % (ref 3.0–12.0)
Neutro Abs: 7.9 10*3/uL — ABNORMAL HIGH (ref 1.4–7.7)
Neutrophils Relative %: 77.4 % — ABNORMAL HIGH (ref 43.0–77.0)
Platelets: 232 10*3/uL (ref 150.0–400.0)
RBC: 4.28 Mil/uL (ref 4.22–5.81)
RDW: 13.6 % (ref 11.5–15.5)
WBC: 10.2 10*3/uL (ref 4.0–10.5)

## 2023-05-30 LAB — HEMOGLOBIN A1C: Hgb A1c MFr Bld: 7.2 % — ABNORMAL HIGH (ref 4.6–6.5)

## 2023-05-30 LAB — FOLATE: Folate: 7 ng/mL (ref >5.9)

## 2023-05-30 LAB — TSH: TSH: 3.19 u[IU]/mL (ref 0.35–5.50)

## 2023-05-30 LAB — T4, FREE: Free T4: 0.97 ng/dL (ref 0.60–1.60)

## 2023-05-30 LAB — VITAMIN B12: Vitamin B-12: 760 pg/mL (ref 211–911)

## 2023-06-03 ENCOUNTER — Other Ambulatory Visit: Payer: Self-pay | Admitting: Radiology

## 2023-06-03 DIAGNOSIS — K529 Noninfective gastroenteritis and colitis, unspecified: Secondary | ICD-10-CM

## 2023-06-03 DIAGNOSIS — R197 Diarrhea, unspecified: Secondary | ICD-10-CM | POA: Diagnosis not present

## 2023-06-04 DIAGNOSIS — M19012 Primary osteoarthritis, left shoulder: Secondary | ICD-10-CM | POA: Diagnosis not present

## 2023-06-04 LAB — C. DIFFICILE GDH AND TOXIN A/B
GDH ANTIGEN: NOT DETECTED
MICRO NUMBER:: 15033020
SPECIMEN QUALITY:: ADEQUATE
TOXIN A AND B: NOT DETECTED

## 2023-06-04 LAB — GIARDIA ANTIGEN
MICRO NUMBER:: 15032983
RESULT:: NOT DETECTED
SPECIMEN QUALITY:: ADEQUATE

## 2023-06-05 ENCOUNTER — Other Ambulatory Visit: Payer: Self-pay | Admitting: Primary Care

## 2023-06-05 ENCOUNTER — Telehealth: Payer: Self-pay

## 2023-06-05 DIAGNOSIS — R195 Other fecal abnormalities: Secondary | ICD-10-CM

## 2023-06-05 DIAGNOSIS — K529 Noninfective gastroenteritis and colitis, unspecified: Secondary | ICD-10-CM

## 2023-06-05 LAB — GASTROINTESTINAL PATHOGEN PNL
CampyloBacter Group: NOT DETECTED
Norovirus GI/GII: NOT DETECTED
Rotavirus A: NOT DETECTED
Salmonella species: NOT DETECTED
Shiga Toxin 1: NOT DETECTED
Shiga Toxin 2: NOT DETECTED
Shigella Species: NOT DETECTED
Vibrio Group: NOT DETECTED
Yersinia enterocolitica: NOT DETECTED

## 2023-06-05 LAB — FECAL OCCULT BLOOD, IMMUNOCHEMICAL: Fecal Occult Bld: POSITIVE — AB

## 2023-06-05 NOTE — Telephone Encounter (Signed)
Jacki Cones with [facility] called report + ifob. Sending note to Allayne Gitelman NP and [facility].

## 2023-06-05 NOTE — Telephone Encounter (Signed)
Called and spoke with patients wife, she states the patient has not has any dark/tarry stool, or any rectal bleeding. She states she has not had any hemorrhoids. She said he saw a GI doctor many years ago and believes he retired. She would like new referral placed for [facility].

## 2023-06-05 NOTE — Telephone Encounter (Signed)
Please call patient:  Stool card was positive for blood.  Has he noticed dark/tarry stools, bright red rectal bleeding? Does he have a GI doctor? Any recent hemorrhoids?

## 2023-06-05 NOTE — Telephone Encounter (Signed)
Noted  Referral placed.

## 2023-06-06 ENCOUNTER — Telehealth: Payer: Self-pay | Admitting: Primary Care

## 2023-06-06 NOTE — Telephone Encounter (Signed)
Wife called back states that she did not think that her questions have not been understood. Wife states that she just wants to know if she can start him back on the HCTZ. She had d/c HCTZ on 5/28 because his blood pressure was low. Swelling started a few days after he d/c meds. Swelling is the same as before when he started medication. He has been tying to elevated but not helping. Readings have been in the 119/74 - 120/78 range. Today readings are 133/73 with HR 77, Patient is not complaining of pain in foot at all. Swelling is Lt>RT same as in the past. Denies any redness or streaking not warm to touch. Swelling is just more in top of feet just like in the past. Would like to know if she keeps eye on his bp can she start back on HCTZ?

## 2023-06-06 NOTE — Telephone Encounter (Signed)
Pt's wife, Dwana Curd, called stating she stopped the pt from taking hydrochlorothiazide (HYDRODIURIL) 12.5 MG tablet [[id number]] on 05/28/23 due the the pt's BP being low. Dwana Curd states now the pt's feet is very swollen. [name] states she's worried about the swelling & asked if she start the pt back on the meds? Transferred [name] & pt to access nurse. Call back # [phone]

## 2023-06-06 NOTE — Telephone Encounter (Signed)
Called and advised patients wife, she verbalized understanding. Will call with any further questions

## 2023-06-06 NOTE — Telephone Encounter (Signed)
My recommendation is that if he would like to resume hydrochlorothiazide, then reduce losartan to 25 mg (1 pill) daily for blood pressure.  Okay for her to monitor blood pressure at home.  Okay to resume hydrochlorothiazide 12.5 mg.

## 2023-06-11 DIAGNOSIS — M19012 Primary osteoarthritis, left shoulder: Secondary | ICD-10-CM | POA: Diagnosis not present

## 2023-06-11 LAB — CALPROTECTIN, FECAL: Calprotectin, Fecal: 14 ug/g (ref 0–120)

## 2023-06-11 LAB — SPECIMEN STATUS REPORT

## 2023-06-12 ENCOUNTER — Encounter: Payer: Self-pay | Admitting: Gastroenterology

## 2023-06-18 ENCOUNTER — Encounter: Payer: Medicare PPO | Admitting: Primary Care

## 2023-06-18 DIAGNOSIS — M19012 Primary osteoarthritis, left shoulder: Secondary | ICD-10-CM | POA: Diagnosis not present

## 2023-06-18 NOTE — Progress Notes (Signed)
This encounter was created in error - please disregard.

## 2023-06-21 ENCOUNTER — Other Ambulatory Visit: Payer: Self-pay | Admitting: Orthopedic Surgery

## 2023-06-21 ENCOUNTER — Telehealth: Payer: Self-pay | Admitting: Primary Care

## 2023-06-21 DIAGNOSIS — Z8673 Personal history of transient ischemic attack (TIA), and cerebral infarction without residual deficits: Secondary | ICD-10-CM

## 2023-06-21 DIAGNOSIS — R2681 Unsteadiness on feet: Secondary | ICD-10-CM

## 2023-06-21 DIAGNOSIS — M75100 Unspecified rotator cuff tear or rupture of unspecified shoulder, not specified as traumatic: Secondary | ICD-10-CM

## 2023-06-21 DIAGNOSIS — I6381 Other cerebral infarction due to occlusion or stenosis of small artery: Secondary | ICD-10-CM

## 2023-06-21 DIAGNOSIS — M19012 Primary osteoarthritis, left shoulder: Secondary | ICD-10-CM | POA: Diagnosis not present

## 2023-06-21 DIAGNOSIS — R600 Localized edema: Secondary | ICD-10-CM

## 2023-06-21 DIAGNOSIS — R296 Repeated falls: Secondary | ICD-10-CM

## 2023-06-21 NOTE — Telephone Encounter (Signed)
Noted DME order placed for lightweight wheel chair

## 2023-06-21 NOTE — Telephone Encounter (Signed)
Patient wife called in and stated that she needs an order for a light weight wheelchair to be sent in to [facility]. There number is [phone]. She would like a call when this is sent in. Thank you!

## 2023-06-21 NOTE — Telephone Encounter (Signed)
DME order placed and faxed to [facility]. Received fax confirmation.  Called and notified patients wife.

## 2023-06-24 ENCOUNTER — Ambulatory Visit: Payer: Self-pay

## 2023-06-24 ENCOUNTER — Encounter: Payer: Medicare PPO | Admitting: Psychology

## 2023-06-24 ENCOUNTER — Institutional Professional Consult (permissible substitution): Payer: Medicare PPO | Admitting: Psychology

## 2023-06-24 ENCOUNTER — Ambulatory Visit (INDEPENDENT_AMBULATORY_CARE_PROVIDER_SITE_OTHER): Payer: Medicare PPO

## 2023-06-24 VITALS — Ht 73.0 in | Wt 210.0 lb

## 2023-06-24 DIAGNOSIS — Z0000 Encounter for general adult medical examination without abnormal findings: Secondary | ICD-10-CM | POA: Diagnosis not present

## 2023-06-24 NOTE — Progress Notes (Signed)
Subjective:   [name] [name] is a 82 y.o. male who presents for Medicare Annual/Subsequent preventive examination.  Visit Complete: Virtual  I connected with  Alexia Freestone on 06/24/23 by a audio enabled telemedicine application and verified that I am speaking with the correct person using two identifiers.  Patient Location: Home  Provider Location: Home Office  I discussed the limitations of evaluation and management by telemedicine. The patient expressed understanding and agreed to proceed.   Review of Systems      Cardiac Risk Factors include: advanced age (>74men, >70 women);hypertension;diabetes mellitus;male gender;sedentary lifestyle;dyslipidemia     Objective:    Today's Vitals   06/24/23 1412  Weight: 210 lb (95.3 kg)  Height: 6\' 1"  (1.854 m)   Body mass index is 27.71 kg/m.     06/24/2023    2:27 PM 02/04/2023   10:39 AM 09/06/2022   10:12 AM 08/21/2022    8:57 AM 06/14/2022    9:00 AM 04/20/2022    2:37 PM 03/01/2022    3:25 PM  Advanced Directives  Does Patient Have a Medical Advance Directive? No No No No No No No  Would patient like information on creating a medical advance directive? No - Patient declined    No - Patient declined  No - Patient declined    Current Medications (verified) Outpatient Encounter Medications as of 06/24/2023  Medication Sig   atorvastatin (LIPITOR) 10 MG tablet TAKE 1 TABLET BY MOUTH EVERY DAY FOR CHOLESTEROL   cholecalciferol (VITAMIN D3) 25 MCG (1000 UNIT) tablet Take 1,000 Units by mouth daily.   clopidogrel (PLAVIX) 75 MG tablet TAKE 1 TABLET BY MOUTH EVERY DAY   cyanocobalamin (VITAMIN B12) 500 MCG tablet Take 500 mcg by mouth daily.   glucose blood (ACCU-CHEK GUIDE) test strip USE AS DIRECTED TO TEST BLOOD SUGAR UP TO 4 TIMES DAILY   hydrochlorothiazide (HYDRODIURIL) 12.5 MG tablet TAKE 1 TABLET (12.5 MG TOTAL) BY MOUTH DAILY FOR BLOOD PRESSURE   losartan (COZAAR) 25 MG tablet TAKE 2 TABLETS (50 MG TOTAL) BY MOUTH DAILY.  FOR BLOOD PRESSURE.   nitroGLYCERIN (NITROSTAT) 0.4 MG SL tablet Place 1 tablet (0.4 mg total) under the tongue every 5 (five) minutes as needed for chest pain.   No facility-administered encounter medications on file as of 06/24/2023.    Allergies (verified) Niacin and Ativan [lorazepam]   History: Past Medical History:  Diagnosis Date   Basal cell carcinoma 11/09/2019   nod & infil-behind right ear-cx3 &exc   Basal cell carcinoma 03/21/2020   Residual BCC with peripheral margin involved - ST recommends MOHs   Carotid artery occlusion    Coronary artery disease    s/p CABG 2005; sees Dr Eden Emms yearly   Diabetes mellitus without complication (HCC)    Gynecomastia    History of COVID-19 01/08/2022   Hypercholesterolemia    Hypertension    Neurodermatitis    Overweight(278.02)    Personal history of colonic polyps 10/08/2006   tubular adenomas   Renal insufficiency    Stroke [facility])    TIA  April 2022   Transient ischemic attack 2010   "lasted ~ 5 seconds"   Vitamin D deficiency    Past Surgical History:  Procedure Laterality Date   CARDIAC CATHETERIZATION  02/2004   "tried to stent; couldn't"   CAROTID ENDARTERECTOMY Right 08/26/2015   COLONOSCOPY     CORONARY ANGIOPLASTY     CORONARY ARTERY BYPASS GRAFT  Feb. 2005   4 vessel  ENDARTERECTOMY Right 08/26/2015   Procedure: Right Carotid ENDARTERECTOMY with Patch Angioplasty ;  Surgeon: Larina Earthly, MD;  Location: [facility] OR;  Service: Vascular;  Laterality: Right;   ENDARTERECTOMY Left 05/18/2021   Procedure: LEFT CAROTID ARTERY ENDARTERECTOMY  with patch angioplasty;  Surgeon: Larina Earthly, MD;  Location: [facility] OR;  Service: Vascular;  Laterality: Left;   EYE SURGERY     bilateral cataract   KELOID EXCISION  04/2008   on chest scar; Dr. Stephens November   KELOID EXCISION     PILONIDAL CYST EXCISION  1989   Family History  Problem Relation Age of Onset   Heart disease Mother        Before age 77   Diabetes Mother    Kidney disease  Mother    Heart attack Mother 24   Lung cancer Father 45   Diabetes Brother    Heart disease Brother    Heart disease Brother    Arthritis Brother    Diabetes Sister    Fibromyalgia Sister    Lung cancer Paternal Uncle        questionable as to if it was lung ca   Healthy Daughter    Colon cancer Neg Hx    Stroke Neg Hx    Social History   Socioeconomic History   Marital status: Married    Spouse name: [name]   Number of children: 1   Years of education: Not on file   Highest education level: Some college, no degree  Occupational History   Occupation: retired  Tobacco Use   Smoking status: Never   Smokeless tobacco: Former  Building services engineer Use: Never used  Substance and Sexual Activity   Alcohol use: No    Alcohol/week: 0.0 standard drinks of alcohol   Drug use: No   Sexual activity: Yes  Other Topics Concern   Not on file  Social History Narrative   Retired.   Once worked for the [employer].   Married.   Enjoys reading, spending time with family.    Left handed   Drinks caffeine   One story home   Social Determinants of Health   Financial Resource Strain: Low Risk  (06/24/2023)   Overall Financial Resource Strain (CARDIA)    Difficulty of Paying Living Expenses: Not hard at all  Food Insecurity: No Food Insecurity (06/24/2023)   Hunger Vital Sign    Worried About Running Out of Food in the Last Year: Never true    Ran Out of Food in the Last Year: Never true  Transportation Needs: No Transportation Needs (06/24/2023)   PRAPARE - Administrator, Civil Service (Medical): No    Lack of Transportation (Non-Medical): No  Physical Activity: Inactive (06/24/2023)   Exercise Vital Sign    Days of Exercise per Week: 0 days    Minutes of Exercise per Session: 0 min  Stress: No Stress Concern Present (06/24/2023)   [employer] of Occupational Health - Occupational Stress Questionnaire    Feeling of Stress : Not at all  Social Connections: Moderately  Integrated (06/24/2023)   Social Connection and Isolation Panel [NHANES]    Frequency of Communication with Friends and Family: More than three times a week    Frequency of Social Gatherings with Friends and Family: More than three times a week    Attends Religious Services: More than 4 times per year    Active Member of [employer] or Organizations: No    Attends [employer]  or Organization Meetings: Never    Marital Status: Married    Tobacco Counseling Counseling given: Not Answered   Clinical Intake:  Pre-visit preparation completed: Yes  Pain : No/denies pain     BMI - recorded: 27.71 Nutritional Risks: None Diabetes: Yes CBG done?: Yes (135 per pt) CBG resulted in Enter/ Edit results?: No Did pt. bring in CBG monitor from home?: No  How often do you need to have someone help you when you read instructions, pamphlets, or other written materials from your doctor or pharmacy?: 1 - Never  Interpreter Needed?: No  Information entered by :: C.Korrina Zern LPN   Activities of Daily Living    06/24/2023    2:27 PM 06/18/2023   11:12 AM  In your present state of health, do you have any difficulty performing the following activities:  Hearing? 0 1  Comment  has hearing aids in both ears  Vision? 0 0  Difficulty concentrating or making decisions? 0 1  Comment  occasionally per wife  Walking or climbing stairs? 0 1  Dressing or bathing? 0 1  Doing errands, shopping? 1 1  Comment wife   Quarry manager and eating ? N Y  Comment  does not prep food, uses thickner for fluids only  Using the Toilet? N Y  In the past six months, have you accidently leaked urine? N Y  Do you have problems with loss of bowel control? Y Y  Comment occasionally, wears depends comes from the thickner he uses  Managing your Medications? N Y  Comment  wife manages  Managing your Finances? N Y  Comment  wife mainly Chief Executive Officer or managing your Housekeeping? N     Patient Care Team: Doreene Nest, NP as PCP - General (Internal Medicine) Wendall Stade, MD as PCP - Cardiology (Cardiology) Wendall Stade, MD as Attending Physician (Cardiology) Drema Dallas, DO as Consulting Physician (Neurology)  Indicate any recent Medical Services you may have received from other than [facility] providers in the past year (date may be approximate).     Assessment:   This is a routine wellness examination for Teryl.  Hearing/Vision screen Hearing Screening - Comments:: Wears hearing aids Vision Screening - Comments:: Glasses - Dr.Lyles UTD on eye exams  Dietary issues and exercise activities discussed:     Goals Addressed             This Visit's Progress    Patient Stated       No new goals       Depression Screen    06/24/2023    2:24 PM 02/12/2023   12:04 PM 06/14/2022    8:58 AM 04/20/2022    4:07 PM 03/22/2022    3:42 PM 08/08/2021   11:00 AM 06/13/2021    9:06 AM  PHQ 2/9 Scores  PHQ - 2 Score 0 0 0 0 2 2 0  PHQ- 9 Score  3 0 0 6  0    Fall Risk    06/24/2023    2:19 PM 02/12/2023   12:03 PM 02/04/2023   10:29 AM 09/06/2022   10:12 AM 06/14/2022    9:01 AM  Fall Risk   Falls in the past year? 0 0 0 0 0  Number falls in past yr: 1 0 0 0 0  Comment fell going to bathroom      Injury with Fall? 0 0 0 0 0  Risk for fall due to : Impaired balance/gait;Impaired  mobility No Fall Risks   No Fall Risks  Follow up Falls prevention discussed;Falls evaluation completed Falls evaluation completed   Falls evaluation completed    MEDICARE RISK AT HOME:  Medicare Risk at Home - 06/24/23 1428     Any stairs in or around the home? Yes    If so, are there any without handrails? No    Home free of loose throw rugs in walkways, pet beds, electrical cords, etc? Yes    Adequate lighting in your home to reduce risk of falls? Yes    Life alert? No    Use of a cane, walker or w/c? Yes    Grab bars in the bathroom? Yes    Shower chair or bench in shower? Yes    Elevated toilet seat or a  handicapped toilet? Yes             Cognitive Function:    06/13/2021    9:12 AM  MMSE - Mini Mental State Exam  Orientation to time 5  Orientation to Place 5  Registration 3  Attention/ Calculation 5  Recall 3  Language- repeat 1        06/24/2023    2:30 PM  6CIT Screen  What Year? 0 points  What month? 0 points  What time? 0 points  Count back from 20 0 points  Months in reverse 0 points  Repeat phrase 4 points  Total Score 4 points    Immunizations Immunization History  Administered Date(s) Administered   Fluad Quad(high Dose 65+) 03/10/2021   H1N1 02/03/2009   Influenza Split 10/30/2011, 10/02/2012, 11/30/2013, 11/30/2017   Influenza Whole 10/18/2009   Influenza, High Dose Seasonal PF 11/30/2016, 11/12/2018, 10/03/2019   Influenza,inj,Quad PF,6+ Mos 01/28/2015, 01/30/2016   PFIZER(Purple Top)SARS-COV-2 Vaccination 02/14/2020, 03/08/2020, 11/18/2020   Pneumococcal Conjugate-13 08/13/2017   Pneumococcal Polysaccharide-23 10/01/1999, 11/17/2009   Td 10/01/1999, 05/16/2010    TDAP status: Due, Education has been provided regarding the importance of this vaccine. Advised may receive this vaccine at local pharmacy or Health Dept. Aware to provide a copy of the vaccination record if obtained from local pharmacy or Health Dept. Verbalized acceptance and understanding.  Flu Vaccine status: Due, Education has been provided regarding the importance of this vaccine. Advised may receive this vaccine at local pharmacy or Health Dept. Aware to provide a copy of the vaccination record if obtained from local pharmacy or Health Dept. Verbalized acceptance and understanding.  Pneumococcal vaccine status: Up to date  Covid-19 vaccine status: Declined, Education has been provided regarding the importance of this vaccine but patient still declined. Advised may receive this vaccine at local pharmacy or Health Dept.or vaccine clinic. Aware to provide a copy of the vaccination record  if obtained from local pharmacy or Health Dept. Verbalized acceptance and understanding.  Qualifies for Shingles Vaccine? Yes   Zostavax completed No   Shingrix Completed?: No.    Education has been provided regarding the importance of this vaccine. Patient has been advised to call insurance company to determine out of pocket expense if they have not yet received this vaccine. Advised may also receive vaccine at local pharmacy or Health Dept. Verbalized acceptance and understanding.  Screening Tests Health Maintenance  Topic Date Due   Diabetic kidney evaluation - Urine ACR  Never done   Hepatitis C Screening  Never done   DTaP/Tdap/Td (3 - Tdap) 05/16/2020   COVID-19 Vaccine (4 - 2023-24 season) 08/31/2022   INFLUENZA VACCINE  08/01/2023   Diabetic  kidney evaluation - eGFR measurement  08/22/2023   OPHTHALMOLOGY EXAM  11/06/2023   HEMOGLOBIN A1C  11/29/2023   FOOT EXAM  05/07/2024   Medicare Annual Wellness (AWV)  06/23/2024   Pneumonia Vaccine 64+ Years old  Completed   HPV VACCINES  Aged Out   Colonoscopy  Discontinued   Zoster Vaccines- Shingrix  Discontinued    Health Maintenance  Health Maintenance Due  Topic Date Due   Diabetic kidney evaluation - Urine ACR  Never done   Hepatitis C Screening  Never done   DTaP/Tdap/Td (3 - Tdap) 05/16/2020   COVID-19 Vaccine (4 - 2023-24 season) 08/31/2022    Colorectal cancer screening: No longer required.   Lung Cancer Screening: (Low Dose CT Chest recommended if Age 32-80 years, 20 pack-year currently smoking OR have quit w/in 15years.) does not qualify.   Lung Cancer Screening Referral: n/a  Additional Screening:  Hepatitis C Screening: does not qualify; Completed n/a  Vision Screening: Recommended annual ophthalmology exams for early detection of glaucoma and other disorders of the eye. Is the patient up to date with their annual eye exam?  Yes  Who is the provider or what is the name of the office in which the patient  attends annual eye exams? Dr.Lyles If pt is not established with a provider, would they like to be referred to a provider to establish care? Yes .   Dental Screening: Recommended annual dental exams for proper oral hygiene  Diabetic Foot Exam: Diabetic Foot Exam: Completed 05/08/23  Community Resource Referral / Chronic Care Management: CRR required this visit?  No   CCM required this visit?  No     Plan:     I have personally reviewed and noted the following in the patient's chart:   Medical and social history Use of alcohol, tobacco or illicit drugs  Current medications and supplements including opioid prescriptions. Patient is not currently taking opioid prescriptions. Functional ability and status Nutritional status Physical activity Advanced directives List of other physicians Hospitalizations, surgeries, and ER visits in previous 12 months Vitals Screenings to include cognitive, depression, and falls Referrals and appointments  In addition, I have reviewed and discussed with patient certain preventive protocols, quality metrics, and best practice recommendations. A written personalized care plan for preventive services as well as general preventive health recommendations were provided to patient.     Maryan Puls, LPN   1/61/0960   After Visit Summary: (MyChart) Due to this being a telephonic visit, the after visit summary with patients personalized plan was offered to patient via MyChart   Nurse Notes: none

## 2023-06-24 NOTE — Patient Instructions (Signed)
[name] , Thank you for taking time to come for your Medicare Wellness Visit. I appreciate your ongoing commitment to your health goals. Please review the following plan we discussed and let me know if I can assist you in the future.   These are the goals we discussed:  Goals      DIET - EAT MORE FRUITS AND VEGETABLES     Patient Stated     06/13/2021, I will continue walking 20 minutes everyday and PT once a week for 1 hour.      Patient Stated     No new goals        This is a list of the screening recommended for you and due dates:  Health Maintenance  Topic Date Due   Yearly kidney health urinalysis for diabetes  Never done   Hepatitis C Screening  Never done   DTaP/Tdap/Td vaccine (3 - Tdap) 05/16/2020   COVID-19 Vaccine (4 - 2023-24 season) 08/31/2022   Flu Shot  08/01/2023   Yearly kidney function blood test for diabetes  08/22/2023   Eye exam for diabetics  11/06/2023   Hemoglobin A1C  11/29/2023   Complete foot exam   05/07/2024   Medicare Annual Wellness Visit  06/23/2024   Pneumonia Vaccine  Completed   HPV Vaccine  Aged Out   Colon Cancer Screening  Discontinued   Zoster (Shingles) Vaccine  Discontinued    Advanced directives: none  Conditions/risks identified: Aim for 30 minutes of exercise or brisk walking, 6-8 glasses of water, and 5 servings of fruits and vegetables each day.   Next appointment: Follow up in one year for your annual wellness visit. 06/24/24 @ 2pm televisit  Preventive Care 65 Years and Older, Male  Preventive care refers to lifestyle choices and visits with your health care provider that can promote health and wellness. What does preventive care include? A yearly physical exam. This is also called an annual well check. Dental exams once or twice a year. Routine eye exams. Ask your health care provider how often you should have your eyes checked. Personal lifestyle choices, including: Daily care of your teeth and gums. Regular  physical activity. Eating a healthy diet. Avoiding tobacco and drug use. Limiting alcohol use. Practicing safe sex. Taking low doses of aspirin every day. Taking vitamin and mineral supplements as recommended by your health care provider. What happens during an annual well check? The services and screenings done by your health care provider during your annual well check will depend on your age, overall health, lifestyle risk factors, and family history of disease. Counseling  Your health care provider may ask you questions about your: Alcohol use. Tobacco use. Drug use. Emotional well-being. Home and relationship well-being. Sexual activity. Eating habits. History of falls. Memory and ability to understand (cognition). Work and work Astronomer. Screening  You may have the following tests or measurements: Height, weight, and BMI. Blood pressure. Lipid and cholesterol levels. These may be checked every 5 years, or more frequently if you are over 25 years old. Skin check. Lung cancer screening. You may have this screening every year starting at age 25 if you have a 30-pack-year history of smoking and currently smoke or have quit within the past 15 years. Fecal occult blood test (FOBT) of the stool. You may have this test every year starting at age 71. Flexible sigmoidoscopy or colonoscopy. You may have a sigmoidoscopy every 5 years or a colonoscopy every 10 years starting at age 36. Prostate  cancer screening. Recommendations will vary depending on your family history and other risks. Hepatitis C blood test. Hepatitis B blood test. Sexually transmitted disease (STD) testing. Diabetes screening. This is done by checking your blood sugar (glucose) after you have not eaten for a while (fasting). You may have this done every 1-3 years. Abdominal aortic aneurysm (AAA) screening. You may need this if you are a current or former smoker. Osteoporosis. You may be screened starting at age 75  if you are at high risk. Talk with your health care provider about your test results, treatment options, and if necessary, the need for more tests. Vaccines  Your health care provider may recommend certain vaccines, such as: Influenza vaccine. This is recommended every year. Tetanus, diphtheria, and acellular pertussis (Tdap, Td) vaccine. You may need a Td booster every 10 years. Zoster vaccine. You may need this after age 12. Pneumococcal 13-valent conjugate (PCV13) vaccine. One dose is recommended after age 83. Pneumococcal polysaccharide (PPSV23) vaccine. One dose is recommended after age 50. Talk to your health care provider about which screenings and vaccines you need and how often you need them. This information is not intended to replace advice given to you by your health care provider. Make sure you discuss any questions you have with your health care provider. Document Released: 01/13/2016 Document Revised: 09/05/2016 Document Reviewed: 10/18/2015 Elsevier Interactive Patient Education  2017 [place] Prevention in the Home Falls can cause injuries. They can happen to people of all ages. There are many things you can do to make your home safe and to help prevent falls. What can I do on the outside of my home? Regularly fix the edges of walkways and driveways and fix any cracks. Remove anything that might make you trip as you walk through a door, such as a raised step or threshold. Trim any bushes or trees on the path to your home. Use bright outdoor lighting. Clear any walking paths of anything that might make someone trip, such as rocks or tools. Regularly check to see if handrails are loose or broken. Make sure that both sides of any steps have handrails. Any raised decks and porches should have guardrails on the edges. Have any leaves, snow, or ice cleared regularly. Use sand or salt on walking paths during winter. Clean up any spills in your garage right away. This  includes oil or grease spills. What can I do in the bathroom? Use night lights. Install grab bars by the toilet and in the tub and shower. Do not use towel bars as grab bars. Use non-skid mats or decals in the tub or shower. If you need to sit down in the shower, use a plastic, non-slip stool. Keep the floor dry. Clean up any water that spills on the floor as soon as it happens. Remove soap buildup in the tub or shower regularly. Attach bath mats securely with double-sided non-slip rug tape. Do not have throw rugs and other things on the floor that can make you trip. What can I do in the bedroom? Use night lights. Make sure that you have a light by your bed that is easy to reach. Do not use any sheets or blankets that are too big for your bed. They should not hang down onto the floor. Have a firm chair that has side arms. You can use this for support while you get dressed. Do not have throw rugs and other things on the floor that can make you trip. What  can I do in the kitchen? Clean up any spills right away. Avoid walking on wet floors. Keep items that you use a lot in easy-to-reach places. If you need to reach something above you, use a strong step stool that has a grab bar. Keep electrical cords out of the way. Do not use floor polish or wax that makes floors slippery. If you must use wax, use non-skid floor wax. Do not have throw rugs and other things on the floor that can make you trip. What can I do with my stairs? Do not leave any items on the stairs. Make sure that there are handrails on both sides of the stairs and use them. Fix handrails that are broken or loose. Make sure that handrails are as long as the stairways. Check any carpeting to make sure that it is firmly attached to the stairs. Fix any carpet that is loose or worn. Avoid having throw rugs at the top or bottom of the stairs. If you do have throw rugs, attach them to the floor with carpet tape. Make sure that you  have a light switch at the top of the stairs and the bottom of the stairs. If you do not have them, ask someone to add them for you. What else can I do to help prevent falls? Wear shoes that: Do not have high heels. Have rubber bottoms. Are comfortable and fit you well. Are closed at the toe. Do not wear sandals. If you use a stepladder: Make sure that it is fully opened. Do not climb a closed stepladder. Make sure that both sides of the stepladder are locked into place. Ask someone to hold it for you, if possible. Clearly mark and make sure that you can see: Any grab bars or handrails. First and last steps. Where the edge of each step is. Use tools that help you move around (mobility aids) if they are needed. These include: Canes. Walkers. Scooters. Crutches. Turn on the lights when you go into a dark area. Replace any light bulbs as soon as they burn out. Set up your furniture so you have a clear path. Avoid moving your furniture around. If any of your floors are uneven, fix them. If there are any pets around you, be aware of where they are. Review your medicines with your doctor. Some medicines can make you feel dizzy. This can increase your chance of falling. Ask your doctor what other things that you can do to help prevent falls. This information is not intended to replace advice given to you by your health care provider. Make sure you discuss any questions you have with your health care provider. Document Released: 10/13/2009 Document Revised: 05/24/2016 Document Reviewed: 01/21/2015 Elsevier Interactive Patient Education  2017 [employer].

## 2023-06-28 ENCOUNTER — Encounter: Payer: Self-pay | Admitting: Orthopedic Surgery

## 2023-06-28 DIAGNOSIS — I639 Cerebral infarction, unspecified: Secondary | ICD-10-CM | POA: Diagnosis not present

## 2023-06-28 DIAGNOSIS — R296 Repeated falls: Secondary | ICD-10-CM | POA: Diagnosis not present

## 2023-06-28 DIAGNOSIS — R2681 Unsteadiness on feet: Secondary | ICD-10-CM | POA: Diagnosis not present

## 2023-07-02 ENCOUNTER — Encounter: Payer: Medicare PPO | Admitting: Psychology

## 2023-07-03 ENCOUNTER — Telehealth: Payer: Medicare PPO | Admitting: Primary Care

## 2023-07-03 ENCOUNTER — Encounter: Payer: Medicare PPO | Admitting: Psychology

## 2023-07-07 ENCOUNTER — Ambulatory Visit
Admission: RE | Admit: 2023-07-07 | Discharge: 2023-07-07 | Disposition: A | Discharge status: Home / Self Care | Payer: Medicare PPO | Source: Ambulatory Visit | Attending: Orthopedic Surgery | Admitting: Orthopedic Surgery

## 2023-07-07 DIAGNOSIS — M25512 Pain in left shoulder: Secondary | ICD-10-CM | POA: Diagnosis not present

## 2023-07-07 DIAGNOSIS — M75100 Unspecified rotator cuff tear or rupture of unspecified shoulder, not specified as traumatic: Secondary | ICD-10-CM

## 2023-07-07 DIAGNOSIS — M19012 Primary osteoarthritis, left shoulder: Secondary | ICD-10-CM | POA: Diagnosis not present

## 2023-07-07 DIAGNOSIS — S46012A Strain of muscle(s) and tendon(s) of the rotator cuff of left shoulder, initial encounter: Secondary | ICD-10-CM | POA: Diagnosis not present

## 2023-07-07 DIAGNOSIS — M7552 Bursitis of left shoulder: Secondary | ICD-10-CM | POA: Diagnosis not present

## 2023-07-12 ENCOUNTER — Ambulatory Visit (INDEPENDENT_AMBULATORY_CARE_PROVIDER_SITE_OTHER): Payer: Medicare PPO | Admitting: Primary Care

## 2023-07-12 ENCOUNTER — Encounter: Payer: Self-pay | Admitting: Primary Care

## 2023-07-12 VITALS — BP 114/60 | HR 81 | Temp 98.4°F | Ht 73.0 in | Wt 204.0 lb

## 2023-07-12 DIAGNOSIS — D7589 Other specified diseases of blood and blood-forming organs: Secondary | ICD-10-CM

## 2023-07-12 DIAGNOSIS — F329 Major depressive disorder, single episode, unspecified: Secondary | ICD-10-CM

## 2023-07-12 DIAGNOSIS — E7849 Other hyperlipidemia: Secondary | ICD-10-CM

## 2023-07-12 DIAGNOSIS — Z0000 Encounter for general adult medical examination without abnormal findings: Secondary | ICD-10-CM

## 2023-07-12 DIAGNOSIS — I10 Essential (primary) hypertension: Secondary | ICD-10-CM

## 2023-07-12 DIAGNOSIS — Z125 Encounter for screening for malignant neoplasm of prostate: Secondary | ICD-10-CM

## 2023-07-12 DIAGNOSIS — Z23 Encounter for immunization: Secondary | ICD-10-CM

## 2023-07-12 DIAGNOSIS — Z8673 Personal history of transient ischemic attack (TIA), and cerebral infarction without residual deficits: Secondary | ICD-10-CM | POA: Diagnosis not present

## 2023-07-12 DIAGNOSIS — E1165 Type 2 diabetes mellitus with hyperglycemia: Secondary | ICD-10-CM | POA: Diagnosis not present

## 2023-07-12 DIAGNOSIS — K529 Noninfective gastroenteritis and colitis, unspecified: Secondary | ICD-10-CM

## 2023-07-12 DIAGNOSIS — M25512 Pain in left shoulder: Secondary | ICD-10-CM

## 2023-07-12 LAB — LIPID PANEL
Cholesterol: 130 mg/dL (ref 0–200)
HDL: 31 mg/dL — ABNORMAL LOW (ref >39.00)
NonHDL: 99.29
Total CHOL/HDL Ratio: 4
Triglycerides: 223 mg/dL — ABNORMAL HIGH (ref 0.0–149.0)
VLDL: 44.6 mg/dL — ABNORMAL HIGH (ref 0.0–40.0)

## 2023-07-12 LAB — PSA, MEDICARE: PSA: 3.54 ng/ml (ref 0.10–4.00)

## 2023-07-12 LAB — IBC + FERRITIN
Ferritin: 640.4 ng/mL — ABNORMAL HIGH (ref 22.0–322.0)
Iron: 169 ug/dL — ABNORMAL HIGH (ref 42–165)
Saturation Ratios: 69 % — ABNORMAL HIGH (ref 20.0–50.0)
TIBC: 245 ug/dL — ABNORMAL LOW (ref 250.0–450.0)
Transferrin: 175 mg/dL — ABNORMAL LOW (ref 212.0–360.0)

## 2023-07-12 LAB — LDL CHOLESTEROL, DIRECT: Direct LDL: 63 mg/dL

## 2023-07-12 LAB — C-REACTIVE PROTEIN: CRP: <1 mg/dL (ref 0.5–20.0)

## 2023-07-12 LAB — VITAMIN B12: Vitamin B-12: 644 pg/mL (ref 211–911)

## 2023-07-12 MED ORDER — LOSARTAN POTASSIUM 25 MG PO TABS
25.0000 mg | ORAL_TABLET | Freq: Every day | ORAL | 0 refills | Status: DC
Start: 2023-07-12 — End: 2023-08-20

## 2023-07-12 NOTE — Assessment & Plan Note (Signed)
Repeat lipid panel pending.  Continue atorvastatin 10 mg daily, Plavix 75 mg daily.

## 2023-07-12 NOTE — Assessment & Plan Note (Signed)
Controlled.  No concerns today. Continue to monitor.  

## 2023-07-12 NOTE — Assessment & Plan Note (Signed)
Prevnar 20 provided today Colonoscopy UTD given age PSA due and pending.  Discussed the importance of a healthy diet and regular exercise in order for weight loss, and to reduce the risk of further co-morbidity.  Exam stable. Labs pending.  Follow up in 1 year for repeat physical.

## 2023-07-12 NOTE — Assessment & Plan Note (Signed)
Chronic.  Labs pending today for B12, CBC, pathology smear, iron studies

## 2023-07-12 NOTE — Assessment & Plan Note (Signed)
Ongoing, his wife never began giving him Metamucil or fiber.  Follow up with Gi as scheduled.

## 2023-07-12 NOTE — Addendum Note (Signed)
Addended by: Melina Copa on: 07/12/2023 10:44 AM   Modules accepted: Orders

## 2023-07-12 NOTE — Assessment & Plan Note (Addendum)
Controlled.  Continue hydrochlorothiazide 12.5 mg daily. Continue losartan 25 mg daily. CMP pending.

## 2023-07-12 NOTE — Progress Notes (Addendum)
Subjective:    Patient ID: [name] [name], male    DOB: 1941-06-10, 82 y.o.   MRN: [id number]  HPI  [name] [name] is a very pleasant 82 y.o. male with a history of hypertension, CVA, carotid artery disease, pneumonia, chronic diarrhea, type 2 diabetes, hyperlipidemia, lower extremity edema who presents today for complete physical and follow up of chronic conditions.  His daughter and wife join [place] today.  Immunizations: -Tetanus: Completed in 2011 -Pneumonia: Completed Prevnar 13 in 2018, Pneumovax 23 in 2010  Diet: Fair diet.  Exercise: No regular exercise.  Eye exam: Completes annually  Dental exam: Completed several  Colonoscopy: Completed in 2013  PSA: Due   BP Readings from Last 3 Encounters:  07/12/23 114/60  05/29/23 114/60  02/04/23 126/77         Review of Systems  Constitutional:  Negative for unexpected weight change.  HENT:  Negative for rhinorrhea.   Respiratory:  Negative for cough and shortness of breath.   Cardiovascular:  Negative for chest pain.  Gastrointestinal:  Positive for diarrhea. Negative for constipation.  Genitourinary:  Negative for difficulty urinating.  Musculoskeletal:  Positive for arthralgias.  Skin:  Negative for rash.  Allergic/Immunologic: Negative for environmental allergies.  Neurological:  Negative for dizziness and headaches.  Psychiatric/Behavioral:  The patient is nervous/anxious.          Past Medical History:  Diagnosis Date   Aspiration pneumonia vs. CAP 02/28/2022   Basal cell carcinoma 11/09/2019   nod & infil-behind right ear-cx3 &exc   Basal cell carcinoma 03/21/2020   Residual BCC with peripheral margin involved - ST recommends MOHs   Carotid artery occlusion    Coronary artery disease    s/p CABG 2005; sees Dr Eden Emms yearly   Diabetes mellitus without complication (HCC)    Gynecomastia    History of COVID-19 01/08/2022   Hypercholesterolemia    Hypertension    Neurodermatitis     Overweight(278.02)    Personal history of colonic polyps 10/08/2006   tubular adenomas   Renal insufficiency    Stroke [facility])    TIA  April 2022   Transient ischemic attack 2010   "lasted ~ 5 seconds"   Vitamin D deficiency     Social History   Socioeconomic History   Marital status: Married    Spouse name: [name]   Number of children: 1   Years of education: Not on file   Highest education level: Some college, no degree  Occupational History   Occupation: retired  Tobacco Use   Smoking status: Never   Smokeless tobacco: Former  Building services engineer status: Never Used  Substance and Sexual Activity   Alcohol use: No    Alcohol/week: 0.0 standard drinks of alcohol   Drug use: No   Sexual activity: Yes  Other Topics Concern   Not on file  Social History Narrative   Retired.   Once worked for the [employer].   Married.   Enjoys reading, spending time with family.    Left handed   Drinks caffeine   One story home   Social Determinants of Health   Financial Resource Strain: Low Risk  (06/24/2023)   Overall Financial Resource Strain (CARDIA)    Difficulty of Paying Living Expenses: Not hard at all  Food Insecurity: No Food Insecurity (06/24/2023)   Hunger Vital Sign    Worried About Running Out of Food in the Last Year: Never true    Ran Out of Food  in the Last Year: Never true  Transportation Needs: No Transportation Needs (06/24/2023)   PRAPARE - Administrator, Civil Service (Medical): No    Lack of Transportation (Non-Medical): No  Physical Activity: Inactive (06/24/2023)   Exercise Vital Sign    Days of Exercise per Week: 0 days    Minutes of Exercise per Session: 0 min  Stress: No Stress Concern Present (06/24/2023)   [employer] of Occupational Health - Occupational Stress Questionnaire    Feeling of Stress : Not at all  Social Connections: Moderately Integrated (06/24/2023)   Social Connection and Isolation Panel [NHANES]    Frequency of Communication  with Friends and Family: More than three times a week    Frequency of Social Gatherings with Friends and Family: More than three times a week    Attends Religious Services: More than 4 times per year    Active Member of [employer] or Organizations: No    Attends Banker Meetings: Never    Marital Status: Married  Catering manager Violence: Not At Risk (06/24/2023)   Humiliation, Afraid, Rape, and Kick questionnaire    Fear of Current or Ex-Partner: No    Emotionally Abused: No    Physically Abused: No    Sexually Abused: No    Past Surgical History:  Procedure Laterality Date   CARDIAC CATHETERIZATION  02/2004   "tried to stent; couldn't"   CAROTID ENDARTERECTOMY Right 08/26/2015   COLONOSCOPY     CORONARY ANGIOPLASTY     CORONARY ARTERY BYPASS GRAFT  Feb. 2005   4 vessel   ENDARTERECTOMY Right 08/26/2015   Procedure: Right Carotid ENDARTERECTOMY with Patch Angioplasty ;  Surgeon: Larina Earthly, MD;  Location: [facility];  Service: Vascular;  Laterality: Right;   ENDARTERECTOMY Left 05/18/2021   Procedure: LEFT CAROTID ARTERY ENDARTERECTOMY  with patch angioplasty;  Surgeon: Larina Earthly, MD;  Location: [facility];  Service: Vascular;  Laterality: Left;   EYE SURGERY     bilateral cataract   KELOID EXCISION  04/2008   on chest scar; Dr. Stephens November   KELOID EXCISION     PILONIDAL CYST EXCISION  1989    Family History  Problem Relation Age of Onset   Heart disease Mother        Before age 77   Diabetes Mother    Kidney disease Mother    Heart attack Mother 77   Lung cancer Father 66   Diabetes Brother    Heart disease Brother    Heart disease Brother    Arthritis Brother    Diabetes Sister    Fibromyalgia Sister    Lung cancer Paternal Uncle        questionable as to if it was lung ca   Healthy Daughter    Colon cancer Neg Hx    Stroke Neg Hx     Allergies  Allergen Reactions   Niacin Other (See Comments)    intol to NIACIN w/ headaches    Ativan [Lorazepam] Other  (See Comments)    agitation    Current Outpatient Medications on File Prior to Visit  Medication Sig Dispense Refill   atorvastatin (LIPITOR) 10 MG tablet TAKE 1 TABLET BY MOUTH EVERY DAY FOR CHOLESTEROL 90 tablet 0   cholecalciferol (VITAMIN D3) 25 MCG (1000 UNIT) tablet Take 1,000 Units by mouth daily.     clopidogrel (PLAVIX) 75 MG tablet TAKE 1 TABLET BY MOUTH EVERY DAY 90 tablet 0   cyanocobalamin (VITAMIN B12)  500 MCG tablet Take 500 mcg by mouth daily.     glucose blood (ACCU-CHEK GUIDE) test strip USE AS DIRECTED TO TEST BLOOD SUGAR UP TO 4 TIMES DAILY 100 strip 3   hydrochlorothiazide (HYDRODIURIL) 12.5 MG tablet TAKE 1 TABLET (12.5 MG TOTAL) BY MOUTH DAILY FOR BLOOD PRESSURE 90 tablet 0   nitroGLYCERIN (NITROSTAT) 0.4 MG SL tablet Place 1 tablet (0.4 mg total) under the tongue every 5 (five) minutes as needed for chest pain. 25 tablet 3   VALIUM 2 MG tablet Take by mouth.     No current facility-administered medications on file prior to visit.    BP 114/60   Pulse 81   Temp 98.4 F (36.9 C) (Temporal)   Ht 6\' 1"  (1.854 m) Comment: in wheelchair.  Wt 204 lb (92.5 kg)   SpO2 94%   BMI 26.91 kg/m  Objective:   Physical Exam HENT:     Right Ear: Tympanic membrane and ear canal normal.     Left Ear: Tympanic membrane and ear canal normal.     Nose: Nose normal.     Right Sinus: No maxillary sinus tenderness or frontal sinus tenderness.     Left Sinus: No maxillary sinus tenderness or frontal sinus tenderness.  Eyes:     Conjunctiva/sclera: Conjunctivae normal.  Neck:     Thyroid: No thyromegaly.     Vascular: No carotid bruit.  Cardiovascular:     Rate and Rhythm: Normal rate and regular rhythm.     Heart sounds: Normal heart sounds.  Pulmonary:     Effort: Pulmonary effort is normal.     Breath sounds: Normal breath sounds. No wheezing or rales.  Abdominal:     General: Bowel sounds are normal.     Palpations: Abdomen is soft.     Tenderness: There is no  abdominal tenderness.  Musculoskeletal:        General: Normal range of motion.     Cervical back: Neck supple.     Comments: 4 out of 5 strength to left upper and lower extremities, chronic since stroke  Skin:    General: Skin is warm and dry.  Neurological:     Mental Status: He is alert and oriented to person, place, and time.     Cranial Nerves: No cranial nerve deficit.     Deep Tendon Reflexes: Reflexes are normal and symmetric.     Comments: Bilateral ankle and pedal edema, left greater than right. No lower extremity edema.  No pitting.  Psychiatric:        Mood and Affect: Mood normal.           Assessment & Plan:  Preventative health care Assessment & Plan: Prevnar 20 provided today Colonoscopy UTD given age PSA due and pending.  Discussed the importance of a healthy diet and regular exercise in order for weight loss, and to reduce the risk of further co-morbidity.  Exam stable. Labs pending.  Follow up in 1 year for repeat physical.    Essential hypertension Assessment & Plan: Controlled.  Continue hydrochlorothiazide 12.5 mg daily. Continue losartan 25 mg daily. CMP pending.  Orders: -     Losartan Potassium; Take 1 tablet (25 mg total) by mouth daily. For blood pressure.  Dispense: 90 tablet; Refill: 0  Chronic diarrhea Assessment & Plan: Ongoing, his wife never began giving him Metamucil or fiber.  Follow up with Gi as scheduled.    Type 2 diabetes mellitus with hyperglycemia, without long-term current use  of insulin (HCC) Assessment & Plan: Controlled with A1C of 7.2.  Given his frailty, we will continue to work on diet and refrain from treatment.  Repeat A1C in November.   Reactive depression Assessment & Plan: Controlled.  No concerns today. Continue to monitor.    Other hyperlipidemia Assessment & Plan: Repeat lipid panel pending.  Continue atorvastatin 10 mg daily, Plavix 75 mg daily.  Orders: -     Lipid panel -      C-reactive protein  History of CVA (cerebrovascular accident) Assessment & Plan: No new symptoms.  Continue Plavix 75 mg daily, lipid control, BP control, diabetes control.  Orders: -     C-reactive protein  Screening for prostate cancer -     PSA, Medicare  Macrocytosis Assessment & Plan: Chronic.  Labs pending today for B12, CBC, pathology smear, iron studies  Orders: -     Pathologist smear review -     CBC -     Vitamin B12 -     IBC + Ferritin  Pain in joint of left shoulder Assessment & Plan: Chronic and continued. Following with orthopedics. MRI pending.         Doreene Nest, NP

## 2023-07-12 NOTE — Patient Instructions (Addendum)
Stop by the lab prior to leaving today. I will notify you of your results once received.   Schedule a visit in November for diabetes check.  It was a pleasure to see you today!

## 2023-07-12 NOTE — Assessment & Plan Note (Signed)
No new symptoms.  Continue Plavix 75 mg daily, lipid control, BP control, diabetes control.

## 2023-07-12 NOTE — Assessment & Plan Note (Signed)
Chronic and continued. Following with orthopedics. MRI pending.

## 2023-07-12 NOTE — Assessment & Plan Note (Signed)
Controlled with A1C of 7.2.  Given his frailty, we will continue to work on diet and refrain from treatment.  Repeat A1C in November.

## 2023-07-15 ENCOUNTER — Encounter: Payer: Self-pay | Admitting: Gastroenterology

## 2023-07-15 ENCOUNTER — Ambulatory Visit: Payer: Medicare PPO | Admitting: Gastroenterology

## 2023-07-15 VITALS — BP 118/68 | HR 84 | Ht 73.0 in | Wt 204.0 lb

## 2023-07-15 DIAGNOSIS — R194 Change in bowel habit: Secondary | ICD-10-CM | POA: Diagnosis not present

## 2023-07-15 DIAGNOSIS — R195 Other fecal abnormalities: Secondary | ICD-10-CM | POA: Diagnosis not present

## 2023-07-15 LAB — CBC
HCT: 43.6 % (ref 38.5–50.0)
Hemoglobin: 14.9 g/dL (ref 13.2–17.1)
MCH: 35.4 pg — ABNORMAL HIGH (ref 27.0–33.0)
MCHC: 34.2 g/dL (ref 32.0–36.0)
MCV: 103.6 fL — ABNORMAL HIGH (ref 80.0–100.0)
MPV: 11 fL (ref 7.5–12.5)
Platelets: 252 10*3/uL (ref 140–400)
RBC: 4.21 10*6/uL (ref 4.20–5.80)
RDW: 13.4 % (ref 11.0–15.0)
WBC: 8.7 10*3/uL (ref 3.8–10.8)

## 2023-07-15 LAB — PATHOLOGIST SMEAR REVIEW

## 2023-07-15 NOTE — Progress Notes (Signed)
Agree with assessment / plan as outlined.  

## 2023-07-15 NOTE — Patient Instructions (Addendum)
_______________________________________________________  If your blood pressure at your visit was 140/90 or greater, please contact your primary care physician to follow up on this.  _______________________________________________________  If you are age 80 or older, your body mass index should be between 23-30. Your Body mass index is 26.91 kg/m. If this is out of the aforementioned range listed, please consider follow up with your Primary Care Provider.  If you are age 47 or younger, your body mass index should be between 19-25. Your Body mass index is 26.91 kg/m. If this is out of the aformentioned range listed, please consider follow up with your Primary Care Provider.   ________________________________________________________  The [facility] providers would like to encourage you to use [facility] to communicate with providers for non-urgent requests or questions.  Due to long hold times on the telephone, sending your provider a message by [facility] may be a faster and more efficient way to get a response.  Please allow 48 business hours for a response.  Please remember that this is for non-urgent requests.  _______________________________________________________  Please purchase the following medications over the counter and take as directed: Benefiber or metamucil 1 tablespoon daily for 11 weeks. You can adjust the dose as needed Miralax as needed  You have been scheduled for an appointment with Boone Master PA-C on 09-30-2023 at 2pm . Please arrive 10 minutes early for your appointment.  It was a pleasure to see you today!  Thank you for trusting me with your gastrointestinal care!

## 2023-07-15 NOTE — Progress Notes (Signed)
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 difficile  negative GI pathogen panel Negative fecal calprotectin Negative Giardia Normal thyroid  Recent lab work 07/12/2023 CBC with Hgb 14.9, MCV 103.6 Iron 169, ferritin 640 CRP normal Positive fecal occult  Patient's wife states he has had looser stools since starting a thickener recommended by speech therapy in 2023.  When this initially started he began having loose stools which has become baseline for him.  However, over the past few months he has had more frequent runny stools as well as some fecal incontinence.  States sometimes he will go a day or 2 without a bowel movement and then will have 4-5 bowel movements in 1 day that are runny and inconsistent.  Denies melena/hematochezia.  Denies weight loss.  Patient is wheelchair-bound but is able to walk with a walker at times.  Slow getting to the bathroom.  Denies abdominal pain.  PREVIOUS GI WORKUP   Colonoscopy colonoscopy 02/11/2012 with Dr. Jarold Motto for screening: Normal, repeat  Past Medical History:  Diagnosis Date   Aspiration pneumonia vs. CAP 02/28/2022   Basal cell carcinoma 11/09/2019   nod & infil-behind right ear-cx3 &exc   Basal cell carcinoma 03/21/2020   Residual BCC with peripheral margin involved - ST recommends [facility]   Carotid artery occlusion    Coronary artery disease    s/p CABG 2005; sees Dr Eden Emms yearly   Diabetes mellitus without complication (HCC)    Gynecomastia    History of COVID-19 01/08/2022   Hypercholesterolemia    Hypertension    Neurodermatitis    Overweight(278.02)    Personal history of colonic polyps 10/08/2006   tubular adenomas   Renal insufficiency    Stroke [facility])    TIA  April 2022   Transient ischemic attack 2010   "lasted ~  5 seconds"   Vitamin D deficiency     Past Surgical History:  Procedure Laterality Date   CARDIAC CATHETERIZATION  02/2004   "tried to stent; couldn't"   CAROTID ENDARTERECTOMY Right 08/26/2015   COLONOSCOPY     CORONARY ANGIOPLASTY     CORONARY ARTERY BYPASS GRAFT  Feb. 2005   4 vessel   ENDARTERECTOMY Right 08/26/2015   Procedure: Right Carotid ENDARTERECTOMY with Patch Angioplasty ;  Surgeon: Larina Earthly, MD;  Location: [facility] OR;  Service: Vascular;  Laterality: Right;   ENDARTERECTOMY Left 05/18/2021   Procedure: LEFT CAROTID ARTERY ENDARTERECTOMY  with patch angioplasty;  Surgeon: Larina Earthly, MD;  Location: [facility] OR;  Service: Vascular;  Laterality: Left;   EYE SURGERY     bilateral cataract   KELOID EXCISION  04/2008   on chest scar; Dr. Stephens November   KELOID EXCISION     PILONIDAL CYST EXCISION  1989    Current Outpatient Medications  Medication Sig Dispense Refill   atorvastatin (LIPITOR) 10 MG tablet TAKE 1 TABLET BY MOUTH EVERY DAY FOR CHOLESTEROL 90 tablet 0   cholecalciferol (VITAMIN D3) 25 MCG (1000 UNIT) tablet Take 1,000 Units by mouth daily.     clopidogrel (PLAVIX) 75 MG tablet TAKE 1 TABLET BY MOUTH EVERY DAY 90 tablet 0   cyanocobalamin (VITAMIN B12) 500 MCG tablet Take 500 mcg by mouth daily.     glucose blood (ACCU-CHEK GUIDE) test strip USE AS DIRECTED TO TEST BLOOD SUGAR UP TO  4 TIMES DAILY 100 strip 3   hydrochlorothiazide (HYDRODIURIL) 12.5 MG tablet TAKE 1 TABLET (12.5 MG TOTAL) BY MOUTH DAILY FOR BLOOD PRESSURE 90 tablet 0   losartan (COZAAR) 25 MG tablet Take 1 tablet (25 mg total) by mouth daily. For blood pressure. 90 tablet 0   nitroGLYCERIN (NITROSTAT) 0.4 MG SL tablet Place 1 tablet (0.4 mg total) under the tongue every 5 (five) minutes as needed for chest pain. 25 tablet 3   VALIUM 2 MG tablet Take by mouth.     No current facility-administered medications for this visit.    Allergies as of 07/15/2023 - Review Complete 07/12/2023  Allergen Reaction  Noted   Niacin Other (See Comments)    Ativan [lorazepam] Other (See Comments) 03/01/2022    Family History  Problem Relation Age of Onset   Heart disease Mother        Before age 55   Diabetes Mother    Kidney disease Mother    Heart attack Mother 75   Lung cancer Father 5   Diabetes Brother    Heart disease Brother    Heart disease Brother    Arthritis Brother    Diabetes Sister    Fibromyalgia Sister    Lung cancer Paternal Uncle        questionable as to if it was lung ca   Healthy Daughter    Colon cancer Neg Hx    Stroke Neg Hx     Social History   Socioeconomic History   Marital status: Married    Spouse name: [name]   Number of children: 1   Years of education: Not on file   Highest education level: Some college, no degree  Occupational History   Occupation: retired  Tobacco Use   Smoking status: Never   Smokeless tobacco: Former  Building services engineer status: Never Used  Substance and Sexual Activity   Alcohol use: No    Alcohol/week: 0.0 standard drinks of alcohol   Drug use: No   Sexual activity: Yes  Other Topics Concern   Not on file  Social History Narrative   Retired.   Once worked for the [employer].   Married.   Enjoys reading, spending time with family.    Left handed   Drinks caffeine   One story home   Social Determinants of Health   Financial Resource Strain: Low Risk  (06/24/2023)   Overall Financial Resource Strain (CARDIA)    Difficulty of Paying Living Expenses: Not hard at all  Food Insecurity: No Food Insecurity (06/24/2023)   Hunger Vital Sign    Worried About Running Out of Food in the Last Year: Never true    Ran Out of Food in the Last Year: Never true  Transportation Needs: No Transportation Needs (06/24/2023)   PRAPARE - Administrator, Civil Service (Medical): No    Lack of Transportation (Non-Medical): No  Physical Activity: Inactive (06/24/2023)   Exercise Vital Sign    Days of Exercise per Week: 0 days     Minutes of Exercise per Session: 0 min  Stress: No Stress Concern Present (06/24/2023)   [employer] of Occupational Health - Occupational Stress Questionnaire    Feeling of Stress : Not at all  Social Connections: Moderately Integrated (06/24/2023)   Social Connection and Isolation Panel [NHANES]    Frequency of Communication with Friends and Family: More than three times a week    Frequency of Social Gatherings with Friends and  Family: More than three times a week    Attends Religious Services: More than 4 times per year    Active Member of Clubs or Organizations: No    Attends Banker Meetings: Never    Marital Status: Married  Catering manager Violence: Not At Risk (06/24/2023)   Humiliation, Afraid, Rape, and Kick questionnaire    Fear of Current or Ex-Partner: No    Emotionally Abused: No    Physically Abused: No    Sexually Abused: No    Review of Systems:    Constitutional: No weight loss, fever, chills, weakness or fatigue HEENT: Eyes: No change in vision               Ears, Nose, Throat:  No change in hearing or congestion Skin: No rash or itching Cardiovascular: No chest pain, chest pressure or palpitations   Respiratory: No SOB or cough Gastrointestinal: See HPI and otherwise negative Genitourinary: No dysuria or change in urinary frequency Neurological: No headache, dizziness or syncope Musculoskeletal: No new muscle or joint pain Hematologic: No bleeding or bruising Psychiatric: No history of depression or anxiety    Physical Exam:  Vital signs: There were no vitals taken for this visit.  Constitutional: Well-nourished elderly male in wheelchair.  Slowed speech.  Alert and oriented. head:  Normocephalic and atraumatic. Eyes:   PEERL, EOMI. No icterus. Conjunctiva pink. Respiratory: Respirations even and unlabored. Lungs clear to auscultation bilaterally.   No wheezes, crackles, or rhonchi.  Cardiovascular:  Regular rate and rhythm. No  peripheral edema, cyanosis or pallor.  Gastrointestinal:  Soft, nondistended, nontender. No rebound or guarding. Normal bowel sounds. No appreciable masses or hepatomegaly. Rectal:  Not performed.  Msk:  Symmetrical without gross deformities. Without edema, no deformity or joint abnormality.  Neurologic:  Alert and  oriented x4;  grossly normal neurologically.  Skin:   Dry and intact without significant lesions or rashes. Psychiatric: Oriented to person, place and time. Demonstrates good judgement and reason without abnormal affect or behaviors.   RELEVANT LABS AND IMAGING: CBC    Component Value Date/Time   WBC 8.7 07/12/2023 1030   RBC 4.21 07/12/2023 1030   HGB 14.9 07/12/2023 1030   HCT 43.6 07/12/2023 1030   PLT 252 07/12/2023 1030   MCV 103.6 (H) 07/12/2023 1030   MCH 35.4 (H) 07/12/2023 1030   MCHC 34.2 07/12/2023 1030   RDW 13.4 07/12/2023 1030   LYMPHSABS 1.4 05/29/2023 1540   MONOABS 0.8 05/29/2023 1540   EOSABS 0.1 05/29/2023 1540   BASOSABS 0.1 05/29/2023 1540    CMP     Component Value Date/Time   NA 139 08/21/2022 0853   NA 143 08/03/2021 0000   K 4.1 08/21/2022 0853   CL 105 08/21/2022 0853   CO2 25 08/21/2022 0853   GLUCOSE 154 (H) 08/21/2022 0853   BUN 26 (H) 08/21/2022 0853   BUN 19 08/03/2021 0000   CREATININE 1.04 08/21/2022 0853   CALCIUM 8.5 (L) 08/21/2022 0853   PROT 5.8 (L) 08/21/2022 0853   ALBUMIN 3.2 (L) 08/21/2022 0853   AST 29 08/21/2022 0853   ALT 26 08/21/2022 0853   ALKPHOS 81 08/21/2022 0853   BILITOT 1.0 08/21/2022 0853   GFRNONAA >60 08/21/2022 0853   GFRAA >60 08/27/2015 0339     Assessment/Plan:   82 year old male with history of CVA and residual weakness that is wheelchair-bound/walks with a walker presented today with worsening loose stools.  Had positive fecal occult without overt bleeding.  Extensive negative stool studies and lab work.  Change in bowel habits Suspect change in bowel habits is multifactorial with his  history of residual weakness from his stroke, immobility due to stroke and being wheelchair/walker bound, and use of thickener per speech therapy due to his longstanding dysphagia as a residual effect of his stroke.  Suspect patient may have some constipation resulting in his frequent bowel habits and inconsistent bowel habits. -Recommend fiber daily (can mix in with applesauce) -No improvement with fiber please add in MiraLAX -Discussed with patient, daughter, wife that at this time colonoscopy would be very high risk and also difficult prep since he is wheelchair/walker bound. Will reconsider if unable to manage conservatively.  Heme positive stool Heme positive stool, on Plavix, no overt bleeding, hemoglobin 14.2.  With patient's extensive comorbidities and no overt bleeding along with stable hemoglobin I feel there is not an indication for procedure at this time.  However, if hemoglobin falls or overt bleeding occurs we can consider EGD done at the hospital for further evaluation.  Discussed this with him and his family and they are in agreement on conservative management. -Continue to monitor, possible falsely positive with history of Plavix -Will recheck blood work at follow-up visit -Follow-up 2 to 3 months   Hani Patnode [facility] 07/15/2023, 12:45 PM  Cc: Doreene Nest, NP

## 2023-07-16 ENCOUNTER — Other Ambulatory Visit: Payer: Self-pay | Admitting: Primary Care

## 2023-07-16 DIAGNOSIS — R7989 Other specified abnormal findings of blood chemistry: Secondary | ICD-10-CM

## 2023-07-26 ENCOUNTER — Other Ambulatory Visit: Payer: Self-pay | Admitting: Primary Care

## 2023-07-26 DIAGNOSIS — Z8673 Personal history of transient ischemic attack (TIA), and cerebral infarction without residual deficits: Secondary | ICD-10-CM

## 2023-07-28 DIAGNOSIS — R296 Repeated falls: Secondary | ICD-10-CM | POA: Diagnosis not present

## 2023-07-28 DIAGNOSIS — I639 Cerebral infarction, unspecified: Secondary | ICD-10-CM | POA: Diagnosis not present

## 2023-07-28 DIAGNOSIS — R2681 Unsteadiness on feet: Secondary | ICD-10-CM | POA: Diagnosis not present

## 2023-08-07 ENCOUNTER — Inpatient Hospital Stay: Payer: Medicare PPO | Attending: Hematology and Oncology | Admitting: Hematology and Oncology

## 2023-08-07 ENCOUNTER — Inpatient Hospital Stay: Payer: Medicare PPO

## 2023-08-07 VITALS — BP 136/88 | HR 83 | Temp 97.7°F | Resp 16 | Wt 209.0 lb

## 2023-08-07 DIAGNOSIS — E7800 Pure hypercholesterolemia, unspecified: Secondary | ICD-10-CM | POA: Insufficient documentation

## 2023-08-07 DIAGNOSIS — I2510 Atherosclerotic heart disease of native coronary artery without angina pectoris: Secondary | ICD-10-CM | POA: Insufficient documentation

## 2023-08-07 DIAGNOSIS — Z8673 Personal history of transient ischemic attack (TIA), and cerebral infarction without residual deficits: Secondary | ICD-10-CM | POA: Insufficient documentation

## 2023-08-07 DIAGNOSIS — M7989 Other specified soft tissue disorders: Secondary | ICD-10-CM | POA: Insufficient documentation

## 2023-08-07 DIAGNOSIS — E559 Vitamin D deficiency, unspecified: Secondary | ICD-10-CM | POA: Insufficient documentation

## 2023-08-07 DIAGNOSIS — Z85828 Personal history of other malignant neoplasm of skin: Secondary | ICD-10-CM | POA: Diagnosis not present

## 2023-08-07 DIAGNOSIS — E119 Type 2 diabetes mellitus without complications: Secondary | ICD-10-CM | POA: Diagnosis not present

## 2023-08-07 DIAGNOSIS — Z79899 Other long term (current) drug therapy: Secondary | ICD-10-CM | POA: Diagnosis not present

## 2023-08-07 DIAGNOSIS — Z86010 Personal history of colonic polyps: Secondary | ICD-10-CM | POA: Diagnosis not present

## 2023-08-07 DIAGNOSIS — R7989 Other specified abnormal findings of blood chemistry: Secondary | ICD-10-CM | POA: Insufficient documentation

## 2023-08-07 DIAGNOSIS — I10 Essential (primary) hypertension: Secondary | ICD-10-CM | POA: Insufficient documentation

## 2023-08-07 DIAGNOSIS — Z8616 Personal history of COVID-19: Secondary | ICD-10-CM | POA: Insufficient documentation

## 2023-08-07 DIAGNOSIS — Z7902 Long term (current) use of antithrombotics/antiplatelets: Secondary | ICD-10-CM | POA: Insufficient documentation

## 2023-08-07 LAB — CBC WITH DIFFERENTIAL (CANCER CENTER ONLY)
Abs Immature Granulocytes: 0.15 K/uL — ABNORMAL HIGH (ref 0.00–0.07)
Basophils Absolute: 0.1 K/uL (ref 0.0–0.1)
Basophils Relative: 1 %
Eosinophils Absolute: 0.2 K/uL (ref 0.0–0.5)
Eosinophils Relative: 2 %
HCT: 44.9 % (ref 39.0–52.0)
Hemoglobin: 16.3 g/dL (ref 13.0–17.0)
Immature Granulocytes: 2 %
Lymphocytes Relative: 22 %
Lymphs Abs: 2 K/uL (ref 0.7–4.0)
MCH: 36.8 pg — ABNORMAL HIGH (ref 26.0–34.0)
MCHC: 36.3 g/dL — ABNORMAL HIGH (ref 30.0–36.0)
MCV: 101.4 fL — ABNORMAL HIGH (ref 80.0–100.0)
Monocytes Absolute: 0.9 K/uL (ref 0.1–1.0)
Monocytes Relative: 10 %
Neutro Abs: 5.6 K/uL (ref 1.7–7.7)
Neutrophils Relative %: 63 %
Platelet Count: 265 K/uL (ref 150–400)
RBC: 4.43 MIL/uL (ref 4.22–5.81)
RDW: 13.2 % (ref 11.5–15.5)
WBC Count: 8.8 K/uL (ref 4.0–10.5)
nRBC: 0 % (ref 0.0–0.2)

## 2023-08-07 LAB — RETIC PANEL
Immature Retic Fract: 12.4 % (ref 2.3–15.9)
RBC.: 4.5 MIL/uL (ref 4.22–5.81)
Retic Count, Absolute: 107.6 10*3/uL (ref 19.0–186.0)
Retic Ct Pct: 2.4 % (ref 0.4–3.1)
Reticulocyte Hemoglobin: 39 pg (ref >27.9)

## 2023-08-07 LAB — CMP (CANCER CENTER ONLY)
ALT: 33 U/L (ref 0–44)
AST: 31 U/L (ref 15–41)
Albumin: 4 g/dL (ref 3.5–5.0)
Alkaline Phosphatase: 108 U/L (ref 38–126)
Anion gap: 9 (ref 5–15)
BUN: 18 mg/dL (ref 8–23)
CO2: 30 mmol/L (ref 22–32)
Calcium: 8.7 mg/dL — ABNORMAL LOW (ref 8.9–10.3)
Chloride: 103 mmol/L (ref 98–111)
Creatinine: 1.05 mg/dL (ref 0.61–1.24)
GFR, Estimated: >60 mL/min
Glucose, Bld: 166 mg/dL — ABNORMAL HIGH (ref 70–99)
Potassium: 3.2 mmol/L — ABNORMAL LOW (ref 3.5–5.1)
Sodium: 142 mmol/L (ref 135–145)
Total Bilirubin: 0.6 mg/dL (ref 0.3–1.2)
Total Protein: 7.2 g/dL (ref 6.5–8.1)

## 2023-08-07 LAB — IRON AND IRON BINDING CAPACITY (CC-WL,HP ONLY)
Iron: 185 ug/dL — ABNORMAL HIGH (ref 45–182)
Saturation Ratios: 72 % — ABNORMAL HIGH (ref 17.9–39.5)
TIBC: 256 ug/dL (ref 250–450)
UIBC: 71 ug/dL — ABNORMAL LOW (ref 117–376)

## 2023-08-07 LAB — SEDIMENTATION RATE: Sed Rate: 28 mm/h — ABNORMAL HIGH (ref 0–16)

## 2023-08-07 LAB — C-REACTIVE PROTEIN: CRP: 0.8 mg/dL (ref <1.0)

## 2023-08-07 LAB — FERRITIN: Ferritin: 757 ng/mL — ABNORMAL HIGH (ref 24–336)

## 2023-08-07 NOTE — Progress Notes (Signed)
[facility] Telephone:([phone]   [phone]  INITIAL CONSULT NOTE  Patient Care Team: Doreene Nest, NP as PCP - General (Internal Medicine) Wendall Stade, MD as PCP - Cardiology (Cardiology) Wendall Stade, MD as Attending Physician (Cardiology) Drema Dallas, DO as Consulting Physician (Neurology)  Hematological/Oncological History # Elevated Ferritin 07/12/2023: Iron 169, Sat 69%, Ferritin 640.4, CRP <0.1, Hgb 13.9, MCV 103.6, Plt 252 08/07/2023: establish care with Dr. Leonides Schanz   CHIEF COMPLAINTS/PURPOSE OF CONSULTATION:  "Elevated Ferritin "  HISTORY OF PRESENTING ILLNESS:  [name] [name] 82 y.o. male with medical history significant for CAD, DM type II, HLD, HTN, TIA who presents for evaluation of elevated ferritin.   On review of the previous records Mr. Memon had labs drawn on 07/12/2023 which showed Iron 169, Sat 69%, Ferritin 640.4, CRP <0.1, Hgb 13.9, MCV 103.6, Plt 252.  Due to concern for the elevated iron levels he was referred to hematology for further evaluation and management.  On exam today Mr. Wigfall is accompanied by his wife and son-in-law.  He reports that he has been well overall in the time since he had those labs collected.  He notes that he does have a family history of cirrhosis in his father.  His father passed away in his 65s though he did have lung cancer.  His mother died of an MI in her 50s.  He reports that he personally has heart disease and underwent a bypass in February 2005.  His brother was a smoker and has passed away as well.  He has a daughter who is 65 years old and healthy.  He reports he used to do chewing tobacco but quit after his second stroke about 2 years ago.  He had his first stroke in April 2022 and a second 1 in March 2023.  He notes he does drink some occasional alcohol.  He previously worked as a Scientist, research (physical sciences).  He notes he does have some swelling feet but no change in his urine or leg swelling.   He notes he is not taking any iron supplements or nutritional supplementation.  He does eat red meat approximate twice per week.  He otherwise denies any fevers, chills, sweats, nausea, vomiting or diarrhea.  A full 10 point ROS is otherwise negative.  MEDICAL HISTORY:  Past Medical History:  Diagnosis Date   Aspiration pneumonia vs. CAP 02/28/2022   Basal cell carcinoma 11/09/2019   nod & infil-behind right ear-cx3 &exc   Basal cell carcinoma 03/21/2020   Residual BCC with peripheral margin involved - ST recommends MOHs   Carotid artery occlusion    Coronary artery disease    s/p CABG 2005; sees Dr Eden Emms yearly   Diabetes mellitus without complication (HCC)    Gynecomastia    History of COVID-19 01/08/2022   Hypercholesterolemia    Hypertension    Neurodermatitis    Overweight(278.02)    Personal history of colonic polyps 10/08/2006   tubular adenomas   Renal insufficiency    Stroke [facility])    TIA  April 2022   Transient ischemic attack 2010   "lasted ~ 5 seconds"   Vitamin D deficiency     SURGICAL HISTORY: Past Surgical History:  Procedure Laterality Date   CARDIAC CATHETERIZATION  02/2004   "tried to stent; couldn't"   CAROTID ENDARTERECTOMY Right 08/26/2015   COLONOSCOPY     CORONARY ANGIOPLASTY     CORONARY ARTERY BYPASS GRAFT  Feb. 2005   4  vessel   ENDARTERECTOMY Right 08/26/2015   Procedure: Right Carotid ENDARTERECTOMY with Patch Angioplasty ;  Surgeon: Larina Earthly, MD;  Location: [facility] OR;  Service: Vascular;  Laterality: Right;   ENDARTERECTOMY Left 05/18/2021   Procedure: LEFT CAROTID ARTERY ENDARTERECTOMY  with patch angioplasty;  Surgeon: Larina Earthly, MD;  Location: [facility] OR;  Service: Vascular;  Laterality: Left;   EYE SURGERY     bilateral cataract   KELOID EXCISION  04/2008   on chest scar; Dr. Stephens November   KELOID EXCISION     PILONIDAL CYST EXCISION  1989    SOCIAL HISTORY: Social History   Socioeconomic History   Marital status: Married    Spouse  name: [name]   Number of children: 1   Years of education: Not on file   Highest education level: Some college, no degree  Occupational History   Occupation: retired  Tobacco Use   Smoking status: Never   Smokeless tobacco: Former  Building services engineer status: Never Used  Substance and Sexual Activity   Alcohol use: No    Alcohol/week: 0.0 standard drinks of alcohol   Drug use: No   Sexual activity: Yes  Other Topics Concern   Not on file  Social History Narrative   Retired.   Once worked for the [employer].   Married.   Enjoys reading, spending time with family.    Left handed   Drinks caffeine   One story home   Social Determinants of Health   Financial Resource Strain: Low Risk  (06/24/2023)   Overall Financial Resource Strain (CARDIA)    Difficulty of Paying Living Expenses: Not hard at all  Food Insecurity: No Food Insecurity (06/24/2023)   Hunger Vital Sign    Worried About Running Out of Food in the Last Year: Never true    Ran Out of Food in the Last Year: Never true  Transportation Needs: No Transportation Needs (06/24/2023)   PRAPARE - Administrator, Civil Service (Medical): No    Lack of Transportation (Non-Medical): No  Physical Activity: Inactive (06/24/2023)   Exercise Vital Sign    Days of Exercise per Week: 0 days    Minutes of Exercise per Session: 0 min  Stress: No Stress Concern Present (06/24/2023)   [employer] of Occupational Health - Occupational Stress Questionnaire    Feeling of Stress : Not at all  Social Connections: Moderately Integrated (06/24/2023)   Social Connection and Isolation Panel [NHANES]    Frequency of Communication with Friends and Family: More than three times a week    Frequency of Social Gatherings with Friends and Family: More than three times a week    Attends Religious Services: More than 4 times per year    Active Member of [employer] or Organizations: No    Attends Banker Meetings: Never    Marital  Status: Married  Catering manager Violence: Not At Risk (06/24/2023)   Humiliation, Afraid, Rape, and Kick questionnaire    Fear of Current or Ex-Partner: No    Emotionally Abused: No    Physically Abused: No    Sexually Abused: No    FAMILY HISTORY: Family History  Problem Relation Age of Onset   Heart disease Mother        Before age 27   Diabetes Mother    Kidney disease Mother    Heart attack Mother 30   Lung cancer Father 1   Diabetes Brother    Heart disease  Brother    Heart disease Brother    Arthritis Brother    Diabetes Sister    Fibromyalgia Sister    Lung cancer Paternal Uncle        questionable as to if it was lung ca   Healthy Daughter    Colon cancer Neg Hx    Stroke Neg Hx     ALLERGIES:  is allergic to niacin and ativan [lorazepam].  MEDICATIONS:  Current Outpatient Medications  Medication Sig Dispense Refill   atorvastatin (LIPITOR) 10 MG tablet TAKE 1 TABLET BY MOUTH EVERY DAY FOR CHOLESTEROL 90 tablet 0   cholecalciferol (VITAMIN D3) 25 MCG (1000 UNIT) tablet Take 1,000 Units by mouth daily.     clopidogrel (PLAVIX) 75 MG tablet TAKE 1 TABLET BY MOUTH EVERY DAY 90 tablet 3   cyanocobalamin (VITAMIN B12) 500 MCG tablet Take 500 mcg by mouth daily.     glucose blood (ACCU-CHEK GUIDE) test strip USE AS DIRECTED TO TEST BLOOD SUGAR UP TO 4 TIMES DAILY 100 strip 3   hydrochlorothiazide (HYDRODIURIL) 12.5 MG tablet TAKE 1 TABLET (12.5 MG TOTAL) BY MOUTH DAILY FOR BLOOD PRESSURE 90 tablet 0   losartan (COZAAR) 25 MG tablet Take 1 tablet (25 mg total) by mouth daily. For blood pressure. 90 tablet 0   VALIUM 2 MG tablet Take by mouth.     No current facility-administered medications for this visit.    REVIEW OF SYSTEMS:   Constitutional: ( - ) fevers, ( - )  chills , ( - ) night sweats Eyes: ( - ) blurriness of vision, ( - ) double vision, ( - ) watery eyes Ears, nose, mouth, throat, and face: ( - ) mucositis, ( - ) sore throat Respiratory: ( - ) cough,  ( - ) dyspnea, ( - ) wheezes Cardiovascular: ( - ) palpitation, ( - ) chest discomfort, ( - ) lower extremity swelling Gastrointestinal:  ( - ) nausea, ( - ) heartburn, ( - ) change in bowel habits Skin: ( - ) abnormal skin rashes Lymphatics: ( - ) new lymphadenopathy, ( - ) easy bruising Neurological: ( - ) numbness, ( - ) tingling, ( - ) new weaknesses Behavioral/Psych: ( - ) mood change, ( - ) new changes  All other systems were reviewed with the patient and are negative.  PHYSICAL EXAMINATION:  Vitals:   08/07/23 1338  BP: 136/88  Pulse: 83  Resp: 16  Temp: 97.7 F (36.5 C)  SpO2: 96%   Filed Weights   08/07/23 1338  Weight: 209 lb (94.8 kg)    GENERAL: well appearing elderly Caucasian male in NAD  SKIN: skin color, texture, turgor are normal, no rashes or significant lesions EYES: conjunctiva are pink and non-injected, sclera clear LUNGS: clear to auscultation and percussion with normal breathing effort HEART: regular rate & rhythm and no murmurs and no lower extremity edema Musculoskeletal: no cyanosis of digits and no clubbing  PSYCH: alert & oriented x 3, fluent speech NEURO: no focal motor/sensory deficits  LABORATORY DATA:  I have reviewed the data as listed    Latest Ref Rng & Units 08/07/2023    2:26 PM 07/12/2023   10:30 AM 05/29/2023    3:40 PM  CBC  WBC 4.0 - 10.5 K/uL 8.8  8.7  10.2   Hemoglobin 13.0 - 17.0 g/dL 44.0  10.2  72.5   Hematocrit 39.0 - 52.0 % 44.9  43.6  44.5   Platelets 150 - 400 K/uL 265  252  232.0        Latest Ref Rng & Units 08/07/2023    2:26 PM 08/21/2022    8:53 AM 07/10/2022    1:32 PM  CMP  Glucose 70 - 99 mg/dL 409  811  914   BUN 8 - 23 mg/dL 18  26  25    Creatinine 0.61 - 1.24 mg/dL 7.82  9.56  2.13   Sodium 135 - 145 mmol/L 142  139  140   Potassium 3.5 - 5.1 mmol/L 3.2  4.1  3.8   Chloride 98 - 111 mmol/L 103  105  102   CO2 22 - 32 mmol/L 30  25  31    Calcium 8.9 - 10.3 mg/dL 8.7  8.5  9.0   Total Protein 6.5 - 8.1  g/dL 7.2  5.8    Total Bilirubin 0.3 - 1.2 mg/dL 0.6  1.0    Alkaline Phos 38 - 126 U/L 108  81    AST 15 - 41 U/L 31  29    ALT 0 - 44 U/L 33  26       ASSESSMENT & PLAN [name] [name] 82 y.o. male with medical history significant for CAD, DM type II, HLD, HTN, TIA who presents for evaluation of elevated ferritin.   After review of the labs, review of the records, and discussion with the patient the patients findings are most consistent with elevated ferritin of unclear etiology.   Elevated serum ferritin levels have numerous possible etiologies. These include hereditary hemochromatosis (heterozygous or homozygous), inflammation, liver disease, or iron overload from an exogenous source. Hereditary hemochromatosis is a hereditary condition caused by mutations in the HFE gene, which regulates iron absorption. The most common genes mutated in this condition are the C282Y and H63D genes. Homozygous mutations represent a disease state which requires phlebotomy to decrease ferritin levels to a goal of <50  (Blood (2010) 116 (3): 317-325). The goal is to decrease ferritin so there is no deposition in critical organs (liver, heart, pancreas and thyroid). Heterozygous mutations (or compound heterozygotes) rarely require phlebotomy, but do have elevated serum iron/ferritin levels.  Ferritin is an acute phase reactant and can be elevated with systemic inflammation. Direct damage to liver tissue can also cause spillage of ferritin into the blood, resulting in elevated ferritin.  Additionally, serum iron levels can be quite transient and an elevation or serum iron may not represent a true overload of total body iron (best lab for this is ferritin).   #Elevated Iron/Ferritin --labs to include CBC, CMP, LDH, ESR, CRP --will repeat iron panel and ferritin today --will send for HFE gene mutation. If found to have homozygous mutation for HFE will begin phlebotomies every other week with goal ferritin <50  --will  order US liver to assess for liver disease --if patient confirmed to have hereditary hemochromatosis will order TSH, TTE, and Hepatitis B/C panels.  --RTC pending results of above studies.   Orders Placed This Encounter  Procedures   US Abdomen Complete    Standing Status:   Future    Standing Expiration Date:   08/06/2024    Order Specific Question:   Reason for Exam (SYMPTOM  OR DIAGNOSIS REQUIRED)    Answer:   assess liver for cirrhosis.    Order Specific Question:   Preferred imaging location?    Answer:   [facility]   CBC with Differential (Cancer Center Only)    Standing Status:   Future  Number of Occurrences:   1    Standing Expiration Date:   08/06/2024   CMP (Cancer Center only)    Standing Status:   Future    Number of Occurrences:   1    Standing Expiration Date:   08/06/2024   Ferritin    Standing Status:   Future    Number of Occurrences:   1    Standing Expiration Date:   08/06/2024   Iron and Iron Binding Capacity ([facility] only)    Standing Status:   Future    Number of Occurrences:   1    Standing Expiration Date:   08/06/2024   Retic Panel    Standing Status:   Future    Number of Occurrences:   1    Standing Expiration Date:   08/06/2024   Hemochromatosis DNA, PCR    Standing Status:   Future    Number of Occurrences:   1    Standing Expiration Date:   08/06/2024   Sedimentation rate    Standing Status:   Future    Number of Occurrences:   1    Standing Expiration Date:   08/06/2024   C-reactive protein    Standing Status:   Future    Number of Occurrences:   1    Standing Expiration Date:   08/06/2024    All questions were answered. The patient knows to call the clinic with any problems, questions or concerns.  A total of more than 60 minutes were spent on this encounter with face-to-face time and non-face-to-face time, including preparing to see the patient, ordering tests and/or medications, counseling the patient and coordination of care as  outlined above.   Ulysees Barns, MD Department of Hematology/Oncology [facility] Phone: [phone] Pager: [phone] Email: [email or web address]  08/07/2023 4:33 PM

## 2023-08-08 ENCOUNTER — Ambulatory Visit: Payer: Medicare PPO | Admitting: Podiatry

## 2023-08-12 NOTE — Progress Notes (Unsigned)
NEUROLOGY FOLLOW UP OFFICE NOTE  [name] [name] [id number]  Assessment/Plan:   Transient episode of confusion.  ED described aphasia but it really wasn't aphasia.  TIA is possible but uncertain as he did not clearly have lateralizing symptoms.  I do not suspect recrudescence of symptoms from prior stroke in absence of offending etiology such as UTI.  Seizure is possible but less likely.   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  Due to mobility issues and without ability to travel to outpatient PT, will request home PT. 3  Secondary stroke prevention as managed by PCP/cardiology:             - Plavix 75mg  daily alone             - Statin.  LDL goal less than 70             - Hgb A1c goal less than 7             - Normotensive blood pressure 4  From a neurologic standpoint in regards to the stroke, I see no reason to not drive.  However, due to mobility issues and slowed reflexes, cautioned not to drive at this time and to at least undergo formal driving evaluation first. 5  Follow up 6 months.   Total time spent reviewing chart and face to face with patient and family discussing diagnosis and plan:  35 minutes  History of Present Illness:  [name] [name] is a 82 year old left-handed male with HTN, HLD, DMII, CAD, carotid artery disease s/p R CEA and history of TIA who follows up for possible TIA.  She is accompanied by his wife and son-in-law who supplement history.   UPDATE: Current medications: Plavix 75mg  daily, ASA 81mg  daily, atorvastatin 10mg  daily, HCTZ, losartan   No recurrent episodes of confusion.  He was scheduled for neuropsychological testing in June but after discussion, it was cancelled.  Since last visit in  February, he has had increased generalized weakness.  He is easily fatigued.  He was initially receiving home PT.  A second round was recommended but it was denied by his insurance.  He has no way to travel to outpatient PT.  His wife cannot drive at the moment.  He has been sedentary.  Gait is worse overall.      HISTORY: He was admitted to [facility] from 04/03/2021 to 04/06/2021 for transient left-sided numbness and weakness lasting an hour.  CT head was negative for follow up MRI of brain showed acute small right thalamic infarct with chronic small vessel ischemic changes and multiple chronic lacunar infarcts.  CTA of head and neck showed 70-80% left ICA stenosis.  2D echo showed EF 60-65% with no cardiac source of embolus.  Prior to admission, he was on ASA 81mg  daily.  He was discharged on ASA 81mg  and Plavix 75mg  daily for 3 weeks followed by Plavix alone.   Underwent carotid endarterectomy on 05/18/2021 for asymptomatic left ICA stenosis.   Patient was admitted to the hospital on 02/28/2022 for 2-3 days of left leg weakness, left facial weakness and slurred speech.   MRI of brain showed acute infarct within the right corona radiata.  CTA head and neck showed calcified atherosclerotic plaque in the intercranial ICAs  causing mild-moderate stenosis on left and mild on right, left and right carotid endarterectomy with no residual significant stenosis or occlusion, and unchanged moderate right and mild left vertebral artery stenosis.  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  2D echo showed LVEF 55-60% with Grade I diastolic dysfunction, trival MR, aortic valve sclerosis with no evidence of stenosis with no thrombus or atrial level shunt.  He was discharged on ASA and Plavix for 3 months followed by Plavix alone.   In April 2023, he had started having language difficulty.  He struggled  to get words out.  It started to frustrate him.  No slurred speech, facial droop, or unilateral numbness or weakness.  Symptoms lasted 30 minutes.  He remembers some bits but doesn't remember most of the event. Today blood pressure has been 160-170s/70s-100.  Prior to today, then ave been in the 130s/60s.  He has an appointment with his PCP later today.  Denies palpitations.  EEG on 04/25/2022 showed mild generalized background slowing but no epileptiform discharges.  30 day cardiac event monitor in May-June did not reveal a fib.    On 08/21/2022, he had another episode of aphasia, waking up with difficulty putting words together and using incorrect words.  He kept talking about a battery to change for his hearing aid.  He felt confused.  He went to the bathroom and didn't remember why he was in the bathroom.  No headache, facial droop, unilateral numbness/weakness or other lateralizing symptoms.  Symptoms lasted about 2 hours.  Went to ED.  MRI of brain showed mild DWI hyperintensity in the right corona radiata with ADC correlate potentially indicating subacute infarct vs artifact (where he had a previous stroke) but no obvious acute stroke.    He did not have infection such as UTI.  It was proposed that he may have had recrudescence from prior stroke.      PAST MEDICAL HISTORY: Past Medical History:  Diagnosis Date   Aspiration pneumonia vs. CAP 02/28/2022   Basal cell carcinoma 11/09/2019   nod & infil-behind right ear-cx3 &exc   Basal cell carcinoma 03/21/2020   Residual BCC with peripheral margin involved - ST recommends MOHs   Carotid artery occlusion    Coronary artery disease    s/p CABG 2005; sees Dr Eden Emms yearly   Diabetes mellitus without complication (HCC)    Gynecomastia    History of COVID-19 01/08/2022   Hypercholesterolemia    Hypertension    Neurodermatitis    Overweight(278.02)    Personal history of colonic polyps 10/08/2006   tubular adenomas   Renal insufficiency     Stroke [facility])    TIA  April 2022   Transient ischemic attack 2010   "lasted ~ 5 seconds"   Vitamin D deficiency     MEDICATIONS: Current Outpatient Medications on File Prior to Visit  Medication Sig Dispense Refill   atorvastatin (LIPITOR) 10 MG tablet TAKE 1 TABLET BY MOUTH EVERY DAY FOR CHOLESTEROL 90 tablet 0   cholecalciferol (VITAMIN D3) 25 MCG (1000 UNIT) tablet Take 1,000 Units by mouth daily.     clopidogrel (PLAVIX) 75 MG tablet TAKE 1 TABLET BY MOUTH EVERY DAY 90 tablet 3   cyanocobalamin (VITAMIN B12) 500 MCG tablet Take 500 mcg by mouth daily.     glucose blood (ACCU-CHEK GUIDE) test strip USE AS DIRECTED TO TEST BLOOD SUGAR UP TO 4  TIMES DAILY 100 strip 3   hydrochlorothiazide (HYDRODIURIL) 12.5 MG tablet TAKE 1 TABLET (12.5 MG TOTAL) BY MOUTH DAILY FOR BLOOD PRESSURE 90 tablet 0   losartan (COZAAR) 25 MG tablet Take 1 tablet (25 mg total) by mouth daily. For blood pressure. 90 tablet 0   VALIUM 2 MG tablet Take by mouth.     No current facility-administered medications on file prior to visit.    ALLERGIES: Allergies  Allergen Reactions   Niacin Other (See Comments)    intol to NIACIN w/ headaches    Ativan [Lorazepam] Other (See Comments)    agitation    FAMILY HISTORY: Family History  Problem Relation Age of Onset   Heart disease Mother        Before age 56   Diabetes Mother    Kidney disease Mother    Heart attack Mother 27   Lung cancer Father 57   Diabetes Brother    Heart disease Brother    Heart disease Brother    Arthritis Brother    Diabetes Sister    Fibromyalgia Sister    Lung cancer Paternal Uncle        questionable as to if it was lung ca   Healthy Daughter    Colon cancer Neg Hx    Stroke Neg Hx       Objective:  Blood pressure 104/65, pulse 89, height 6' (1.829 m), weight 209 lb (94.8 kg), SpO2 96%. General: No acute distress.  Patient appears well-groomed.   Head:  Normocephalic/atraumatic Eyes:  Fundi examined but not  visualized Heart:  Regular rate and rhythm Neurological Exam: alert and oriented.  Speech fluent and not dysarthric, language intact.  CN II-XII intact. Bulk and tone normal.  Limited exam of left upper extremity due to rotator cuff tear.  Decreased dexterity of left hand.  4+/5 left hip flexion; otherwise, muscle strength 5/5 throughout.  Sensation to light touch intact.  Deep tendon reflexes 2+ throughout.  Finger to nose testing intact.  In wheelchair.  Requires upper strength to push self up to stand.  Cautious and unsteady wide-based gait.  Unable to ambulate without assistance.  Romberg with sway.     Shon Millet, DO  CC: Vernona Rieger, NP

## 2023-08-13 ENCOUNTER — Telehealth: Payer: Self-pay | Admitting: Neurology

## 2023-08-13 ENCOUNTER — Encounter: Payer: Self-pay | Admitting: Neurology

## 2023-08-13 ENCOUNTER — Ambulatory Visit: Payer: Medicare PPO | Admitting: Neurology

## 2023-08-13 VITALS — BP 104/65 | HR 89 | Ht 72.0 in | Wt 209.0 lb

## 2023-08-13 DIAGNOSIS — E119 Type 2 diabetes mellitus without complications: Secondary | ICD-10-CM | POA: Diagnosis not present

## 2023-08-13 DIAGNOSIS — E7849 Other hyperlipidemia: Secondary | ICD-10-CM

## 2023-08-13 DIAGNOSIS — I6522 Occlusion and stenosis of left carotid artery: Secondary | ICD-10-CM

## 2023-08-13 DIAGNOSIS — I10 Essential (primary) hypertension: Secondary | ICD-10-CM

## 2023-08-13 DIAGNOSIS — R2681 Unsteadiness on feet: Secondary | ICD-10-CM

## 2023-08-13 DIAGNOSIS — I639 Cerebral infarction, unspecified: Secondary | ICD-10-CM | POA: Diagnosis not present

## 2023-08-13 DIAGNOSIS — R29898 Other symptoms and signs involving the musculoskeletal system: Secondary | ICD-10-CM

## 2023-08-13 NOTE — Telephone Encounter (Signed)
Patients wife called, stated Rufino was talking about home health and they would like to discuss [facility].

## 2023-08-13 NOTE — Telephone Encounter (Signed)
Referral faxed over

## 2023-08-13 NOTE — Patient Instructions (Addendum)
Physical therapy for gait instability, core and leg weakness.   No driving

## 2023-08-13 NOTE — Telephone Encounter (Signed)
Patient called back stating that the patient would like [facility] . As listed in previous message

## 2023-08-14 ENCOUNTER — Encounter: Payer: Self-pay | Admitting: Podiatry

## 2023-08-14 ENCOUNTER — Ambulatory Visit: Payer: Medicare PPO | Admitting: Podiatry

## 2023-08-14 DIAGNOSIS — E1165 Type 2 diabetes mellitus with hyperglycemia: Secondary | ICD-10-CM | POA: Diagnosis not present

## 2023-08-14 DIAGNOSIS — M79674 Pain in right toe(s): Secondary | ICD-10-CM

## 2023-08-14 DIAGNOSIS — M79675 Pain in left toe(s): Secondary | ICD-10-CM

## 2023-08-14 DIAGNOSIS — B351 Tinea unguium: Secondary | ICD-10-CM | POA: Diagnosis not present

## 2023-08-14 NOTE — Progress Notes (Signed)
This patient presents  to my office for at risk foot care.  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 Patient has history of CVA. This patient  takes plavix.This patient is unable to cut nails himself since the patient cannot reach his nails.These nails are painful walking and wearing shoes.  This patient presents for at risk foot care today.  General Appearance  Alert, conversant and in no acute stress.  Vascular  Dorsalis pedis and posterior tibial  pulses are weakly  palpable  bilaterally.  Capillary return is within normal limits  bilaterally. Temperature is within normal limits  bilaterally. Swelling persists in feet.   Neurologic  Senn-Weinstein monofilament wire test within normal limits  bilaterally. Muscle power within normal limits bilaterally.  Nails Thick disfigured discolored nails with subungual debris  from hallux to fifth toes bilaterally. No evidence of bacterial infection or drainage bilaterally.  Orthopedic  No limitations of motion  feet .  No crepitus or effusions noted.  No bony pathology or digital deformities noted.  Skin  normotropic skin with no porokeratosis noted bilaterally.  No signs of infections or ulcers noted.     Onychomycosis  Pain in right toes  Pain in left toes  Consent was obtained for treatment procedures.  Mechanical debridement of nails 1-5  bilaterally performed with a nail nipper.  Filed with dremel without incident.    Return office visit    3 months                  Told patient to return for periodic foot care and evaluation due to potential at risk complications.   Gregory Mayer DPM   

## 2023-08-19 ENCOUNTER — Ambulatory Visit (HOSPITAL_COMMUNITY)
Admission: RE | Admit: 2023-08-19 | Discharge: 2023-08-19 | Disposition: A | Discharge status: Home / Self Care | Payer: Medicare PPO | Source: Ambulatory Visit | Attending: Hematology and Oncology | Admitting: Hematology and Oncology

## 2023-08-19 DIAGNOSIS — R7989 Other specified abnormal findings of blood chemistry: Secondary | ICD-10-CM | POA: Diagnosis not present

## 2023-08-19 DIAGNOSIS — K8020 Calculus of gallbladder without cholecystitis without obstruction: Secondary | ICD-10-CM | POA: Diagnosis not present

## 2023-08-19 DIAGNOSIS — K838 Other specified diseases of biliary tract: Secondary | ICD-10-CM | POA: Diagnosis not present

## 2023-08-19 DIAGNOSIS — K7460 Unspecified cirrhosis of liver: Secondary | ICD-10-CM | POA: Diagnosis not present

## 2023-08-20 ENCOUNTER — Other Ambulatory Visit: Payer: Self-pay | Admitting: Primary Care

## 2023-08-20 DIAGNOSIS — I10 Essential (primary) hypertension: Secondary | ICD-10-CM

## 2023-08-21 DIAGNOSIS — M19012 Primary osteoarthritis, left shoulder: Secondary | ICD-10-CM | POA: Diagnosis not present

## 2023-08-24 ENCOUNTER — Other Ambulatory Visit: Payer: Self-pay | Admitting: Primary Care

## 2023-08-24 DIAGNOSIS — E7849 Other hyperlipidemia: Secondary | ICD-10-CM

## 2023-08-26 ENCOUNTER — Telehealth: Payer: Self-pay | Admitting: *Deleted

## 2023-08-26 NOTE — Telephone Encounter (Signed)
TCT patient. 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 The treatment of choice is phlebotomy. We will start phlebotomies this week and continue q 2 weeks until we get his iron levels under control.  Wife voiced understanding.

## 2023-08-26 NOTE — Telephone Encounter (Signed)
-----   Message from Ulysees Barns IV sent at 08/25/2023  6:27 PM EDT ----- Please let Mr. 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 The treatment of choice is phlebotomy. We will start phlebotomies this week and continue q 2 weeks until we get his iron levels under control. ----- Message ----- From: Interface, Lab In [facility] Sent: 08/07/2023   2:54 PM EDT To: Jaci Standard, MD

## 2023-08-27 ENCOUNTER — Other Ambulatory Visit: Payer: Self-pay | Admitting: Hematology and Oncology

## 2023-08-27 ENCOUNTER — Telehealth: Payer: Self-pay | Admitting: Neurology

## 2023-08-27 NOTE — Telephone Encounter (Signed)
Patients wife said she has not heard from anyone regarding the PT. She wants to go with [facility]. Its been since 08/13/23

## 2023-08-27 NOTE — Telephone Encounter (Signed)
Patient's wife called stating that the patient would like his P.therapy to go too [facility]

## 2023-08-27 NOTE — Telephone Encounter (Signed)
Referral faxed 08/23/23.  Refaxed referral today.

## 2023-08-27 NOTE — Telephone Encounter (Signed)
See other encounter from todoay.

## 2023-08-28 DIAGNOSIS — R2681 Unsteadiness on feet: Secondary | ICD-10-CM | POA: Diagnosis not present

## 2023-08-28 DIAGNOSIS — R296 Repeated falls: Secondary | ICD-10-CM | POA: Diagnosis not present

## 2023-08-28 DIAGNOSIS — I639 Cerebral infarction, unspecified: Secondary | ICD-10-CM | POA: Diagnosis not present

## 2023-08-30 ENCOUNTER — Inpatient Hospital Stay: Payer: Medicare PPO

## 2023-08-30 ENCOUNTER — Other Ambulatory Visit: Payer: Self-pay | Admitting: Hematology and Oncology

## 2023-09-05 ENCOUNTER — Other Ambulatory Visit: Payer: Medicare PPO

## 2023-09-05 ENCOUNTER — Telehealth: Payer: Self-pay | Admitting: Primary Care

## 2023-09-05 DIAGNOSIS — I2510 Atherosclerotic heart disease of native coronary artery without angina pectoris: Secondary | ICD-10-CM | POA: Diagnosis not present

## 2023-09-05 DIAGNOSIS — N289 Disorder of kidney and ureter, unspecified: Secondary | ICD-10-CM | POA: Diagnosis not present

## 2023-09-05 DIAGNOSIS — E119 Type 2 diabetes mellitus without complications: Secondary | ICD-10-CM | POA: Diagnosis not present

## 2023-09-05 DIAGNOSIS — M75102 Unspecified rotator cuff tear or rupture of left shoulder, not specified as traumatic: Secondary | ICD-10-CM | POA: Diagnosis not present

## 2023-09-05 DIAGNOSIS — R292 Abnormal reflex: Secondary | ICD-10-CM | POA: Diagnosis not present

## 2023-09-05 DIAGNOSIS — I69398 Other sequelae of cerebral infarction: Secondary | ICD-10-CM | POA: Diagnosis not present

## 2023-09-05 DIAGNOSIS — E7849 Other hyperlipidemia: Secondary | ICD-10-CM | POA: Diagnosis not present

## 2023-09-05 DIAGNOSIS — I10 Essential (primary) hypertension: Secondary | ICD-10-CM | POA: Diagnosis not present

## 2023-09-05 DIAGNOSIS — R2689 Other abnormalities of gait and mobility: Secondary | ICD-10-CM | POA: Diagnosis not present

## 2023-09-05 NOTE — Telephone Encounter (Signed)
Have him stop the losartan 25 mg pill. Continue hydrochlorothiazide 12.5 mg for now.  Have patient's wife monitor BP and notify if readings begin to increase above 140 on top and/or 90 on bottom. Also notify if he continues to experiences drops in his BP as he did today.  Yes, strongly advise compression stockings.

## 2023-09-05 NOTE — Telephone Encounter (Signed)
Rosanne Ashing PT from [facility] seen patient today for PT evaluation,he called to report b/p seated 120/60,standing dropped to 100/50,patient had no symptoms,but in the last few months his legs have given out while standing.Wife was told to hold b/p medicines based on his b/p parameters.He would like clarification on what the medications are and the parameters? Patient is diabetic,but not checking blood sugar.He would like to confirm that the liquid that he is on is [employer]. Patient had a fall last Friday but reports no injury. Patient left leg down to ankle is plus 3 edema,right leg is plus one edema. Wonder if patient would benefit from compression hose.

## 2023-09-05 NOTE — Telephone Encounter (Signed)
Called and reviewed information with daughter on dpr. As requested have sent in my chart message as well for them to have. Will call If any changes listed below or new symptoms.

## 2023-09-09 DIAGNOSIS — M75102 Unspecified rotator cuff tear or rupture of left shoulder, not specified as traumatic: Secondary | ICD-10-CM | POA: Diagnosis not present

## 2023-09-09 DIAGNOSIS — I69398 Other sequelae of cerebral infarction: Secondary | ICD-10-CM | POA: Diagnosis not present

## 2023-09-09 DIAGNOSIS — N289 Disorder of kidney and ureter, unspecified: Secondary | ICD-10-CM | POA: Diagnosis not present

## 2023-09-09 DIAGNOSIS — R292 Abnormal reflex: Secondary | ICD-10-CM | POA: Diagnosis not present

## 2023-09-09 DIAGNOSIS — R2689 Other abnormalities of gait and mobility: Secondary | ICD-10-CM | POA: Diagnosis not present

## 2023-09-09 DIAGNOSIS — E7849 Other hyperlipidemia: Secondary | ICD-10-CM | POA: Diagnosis not present

## 2023-09-09 DIAGNOSIS — I10 Essential (primary) hypertension: Secondary | ICD-10-CM | POA: Diagnosis not present

## 2023-09-09 DIAGNOSIS — I2510 Atherosclerotic heart disease of native coronary artery without angina pectoris: Secondary | ICD-10-CM | POA: Diagnosis not present

## 2023-09-09 DIAGNOSIS — E119 Type 2 diabetes mellitus without complications: Secondary | ICD-10-CM | POA: Diagnosis not present

## 2023-09-11 ENCOUNTER — Other Ambulatory Visit: Payer: Self-pay | Admitting: Primary Care

## 2023-09-11 DIAGNOSIS — I10 Essential (primary) hypertension: Secondary | ICD-10-CM

## 2023-09-12 ENCOUNTER — Telehealth: Payer: Self-pay | Admitting: Primary Care

## 2023-09-12 DIAGNOSIS — R2689 Other abnormalities of gait and mobility: Secondary | ICD-10-CM | POA: Diagnosis not present

## 2023-09-12 DIAGNOSIS — N289 Disorder of kidney and ureter, unspecified: Secondary | ICD-10-CM | POA: Diagnosis not present

## 2023-09-12 DIAGNOSIS — E7849 Other hyperlipidemia: Secondary | ICD-10-CM | POA: Diagnosis not present

## 2023-09-12 DIAGNOSIS — E119 Type 2 diabetes mellitus without complications: Secondary | ICD-10-CM | POA: Diagnosis not present

## 2023-09-12 DIAGNOSIS — I2510 Atherosclerotic heart disease of native coronary artery without angina pectoris: Secondary | ICD-10-CM | POA: Diagnosis not present

## 2023-09-12 DIAGNOSIS — I69398 Other sequelae of cerebral infarction: Secondary | ICD-10-CM | POA: Diagnosis not present

## 2023-09-12 DIAGNOSIS — R292 Abnormal reflex: Secondary | ICD-10-CM | POA: Diagnosis not present

## 2023-09-12 DIAGNOSIS — I10 Essential (primary) hypertension: Secondary | ICD-10-CM | POA: Diagnosis not present

## 2023-09-12 DIAGNOSIS — M75102 Unspecified rotator cuff tear or rupture of left shoulder, not specified as traumatic: Secondary | ICD-10-CM | POA: Diagnosis not present

## 2023-09-12 NOTE — Telephone Encounter (Signed)
Kelly from [facility] called over and stated that patient blood pressure this morning while sitting was 138/64 and standing was 102/50, it did increase after standing for 2 minutes to 110/58. She stated that patient denies dizziness or weakness. She also gave some readings for the prior days which was taking one tine a day in the morning.  9/9- BP sitting 123/75 standing 107/69 9/10- BP sitting 176/92 standing 96/61 9/11- BP sitting 140/74 standing 72/52  She stated that she just wanted to make [facility] aware. Thank you!

## 2023-09-12 NOTE — Telephone Encounter (Signed)
Noted, thank you

## 2023-09-16 ENCOUNTER — Telehealth: Payer: Self-pay | Admitting: *Deleted

## 2023-09-16 DIAGNOSIS — E119 Type 2 diabetes mellitus without complications: Secondary | ICD-10-CM | POA: Diagnosis not present

## 2023-09-16 DIAGNOSIS — E7849 Other hyperlipidemia: Secondary | ICD-10-CM | POA: Diagnosis not present

## 2023-09-16 DIAGNOSIS — N289 Disorder of kidney and ureter, unspecified: Secondary | ICD-10-CM | POA: Diagnosis not present

## 2023-09-16 DIAGNOSIS — R2689 Other abnormalities of gait and mobility: Secondary | ICD-10-CM | POA: Diagnosis not present

## 2023-09-16 DIAGNOSIS — I69398 Other sequelae of cerebral infarction: Secondary | ICD-10-CM | POA: Diagnosis not present

## 2023-09-16 DIAGNOSIS — R292 Abnormal reflex: Secondary | ICD-10-CM | POA: Diagnosis not present

## 2023-09-16 DIAGNOSIS — I10 Essential (primary) hypertension: Secondary | ICD-10-CM | POA: Diagnosis not present

## 2023-09-16 DIAGNOSIS — M75102 Unspecified rotator cuff tear or rupture of left shoulder, not specified as traumatic: Secondary | ICD-10-CM | POA: Diagnosis not present

## 2023-09-16 DIAGNOSIS — I2510 Atherosclerotic heart disease of native coronary artery without angina pectoris: Secondary | ICD-10-CM | POA: Diagnosis not present

## 2023-09-16 NOTE — Telephone Encounter (Signed)
Received call from pt's wife. She states [name] [name] has been having problems with low blood pressure at home. She is concerned appt having the phlebotomy done and having his pressure get low. Advised that we monitor his BP before and after the procedure. If his pressure gets low afterwards, advised that we can give him some IV fluids to help bring his pressure back up. She states his hydrochlorothiazide is on hold for now. Encouraged increasing his po fluids every day and especially on the days when he has the phlebotomy done. Wife voiced understanding.

## 2023-09-17 ENCOUNTER — Telehealth: Payer: Self-pay

## 2023-09-17 DIAGNOSIS — E7849 Other hyperlipidemia: Secondary | ICD-10-CM | POA: Diagnosis not present

## 2023-09-17 DIAGNOSIS — M75102 Unspecified rotator cuff tear or rupture of left shoulder, not specified as traumatic: Secondary | ICD-10-CM | POA: Diagnosis not present

## 2023-09-17 DIAGNOSIS — I10 Essential (primary) hypertension: Secondary | ICD-10-CM | POA: Diagnosis not present

## 2023-09-17 DIAGNOSIS — I2510 Atherosclerotic heart disease of native coronary artery without angina pectoris: Secondary | ICD-10-CM | POA: Diagnosis not present

## 2023-09-17 DIAGNOSIS — N289 Disorder of kidney and ureter, unspecified: Secondary | ICD-10-CM | POA: Diagnosis not present

## 2023-09-17 DIAGNOSIS — I69398 Other sequelae of cerebral infarction: Secondary | ICD-10-CM | POA: Diagnosis not present

## 2023-09-17 DIAGNOSIS — E119 Type 2 diabetes mellitus without complications: Secondary | ICD-10-CM | POA: Diagnosis not present

## 2023-09-17 DIAGNOSIS — R2689 Other abnormalities of gait and mobility: Secondary | ICD-10-CM | POA: Diagnosis not present

## 2023-09-17 DIAGNOSIS — R292 Abnormal reflex: Secondary | ICD-10-CM | POA: Diagnosis not present

## 2023-09-17 NOTE — Telephone Encounter (Signed)
Rep advised of the Okay to have OT x1 week for four weeks per Dr.Jaffe.

## 2023-09-17 NOTE — Telephone Encounter (Signed)
Telephone call from [place] with [facility] Needing verbal order for OT 1 x a week for four weeks.

## 2023-09-18 DIAGNOSIS — R292 Abnormal reflex: Secondary | ICD-10-CM | POA: Diagnosis not present

## 2023-09-18 DIAGNOSIS — M75102 Unspecified rotator cuff tear or rupture of left shoulder, not specified as traumatic: Secondary | ICD-10-CM | POA: Diagnosis not present

## 2023-09-18 DIAGNOSIS — E119 Type 2 diabetes mellitus without complications: Secondary | ICD-10-CM | POA: Diagnosis not present

## 2023-09-18 DIAGNOSIS — N289 Disorder of kidney and ureter, unspecified: Secondary | ICD-10-CM | POA: Diagnosis not present

## 2023-09-18 DIAGNOSIS — I2510 Atherosclerotic heart disease of native coronary artery without angina pectoris: Secondary | ICD-10-CM | POA: Diagnosis not present

## 2023-09-18 DIAGNOSIS — I10 Essential (primary) hypertension: Secondary | ICD-10-CM | POA: Diagnosis not present

## 2023-09-18 DIAGNOSIS — R2689 Other abnormalities of gait and mobility: Secondary | ICD-10-CM | POA: Diagnosis not present

## 2023-09-18 DIAGNOSIS — I69398 Other sequelae of cerebral infarction: Secondary | ICD-10-CM | POA: Diagnosis not present

## 2023-09-18 DIAGNOSIS — E7849 Other hyperlipidemia: Secondary | ICD-10-CM | POA: Diagnosis not present

## 2023-09-19 DIAGNOSIS — E7849 Other hyperlipidemia: Secondary | ICD-10-CM | POA: Diagnosis not present

## 2023-09-19 DIAGNOSIS — E119 Type 2 diabetes mellitus without complications: Secondary | ICD-10-CM | POA: Diagnosis not present

## 2023-09-19 DIAGNOSIS — I10 Essential (primary) hypertension: Secondary | ICD-10-CM | POA: Diagnosis not present

## 2023-09-19 DIAGNOSIS — M75102 Unspecified rotator cuff tear or rupture of left shoulder, not specified as traumatic: Secondary | ICD-10-CM | POA: Diagnosis not present

## 2023-09-19 DIAGNOSIS — R2689 Other abnormalities of gait and mobility: Secondary | ICD-10-CM | POA: Diagnosis not present

## 2023-09-19 DIAGNOSIS — I69398 Other sequelae of cerebral infarction: Secondary | ICD-10-CM | POA: Diagnosis not present

## 2023-09-19 DIAGNOSIS — N289 Disorder of kidney and ureter, unspecified: Secondary | ICD-10-CM | POA: Diagnosis not present

## 2023-09-19 DIAGNOSIS — R292 Abnormal reflex: Secondary | ICD-10-CM | POA: Diagnosis not present

## 2023-09-19 DIAGNOSIS — I2510 Atherosclerotic heart disease of native coronary artery without angina pectoris: Secondary | ICD-10-CM | POA: Diagnosis not present

## 2023-09-20 ENCOUNTER — Inpatient Hospital Stay: Payer: Medicare PPO

## 2023-09-20 ENCOUNTER — Inpatient Hospital Stay: Payer: Medicare PPO | Attending: Hematology and Oncology

## 2023-09-20 ENCOUNTER — Telehealth: Payer: Self-pay | Admitting: Primary Care

## 2023-09-20 DIAGNOSIS — R7989 Other specified abnormal findings of blood chemistry: Secondary | ICD-10-CM | POA: Insufficient documentation

## 2023-09-20 DIAGNOSIS — I872 Venous insufficiency (chronic) (peripheral): Secondary | ICD-10-CM

## 2023-09-20 DIAGNOSIS — Z8673 Personal history of transient ischemic attack (TIA), and cerebral infarction without residual deficits: Secondary | ICD-10-CM

## 2023-09-20 DIAGNOSIS — R600 Localized edema: Secondary | ICD-10-CM

## 2023-09-20 LAB — CBC WITH DIFFERENTIAL (CANCER CENTER ONLY)
Abs Immature Granulocytes: 0.08 10*3/uL — ABNORMAL HIGH (ref 0.00–0.07)
Basophils Absolute: 0 10*3/uL (ref 0.0–0.1)
Basophils Relative: 1 %
Eosinophils Absolute: 0.1 10*3/uL (ref 0.0–0.5)
Eosinophils Relative: 2 %
HCT: 43.6 % (ref 39.0–52.0)
Hemoglobin: 15.2 g/dL (ref 13.0–17.0)
Immature Granulocytes: 1 %
Lymphocytes Relative: 20 %
Lymphs Abs: 1.6 10*3/uL (ref 0.7–4.0)
MCH: 35.7 pg — ABNORMAL HIGH (ref 26.0–34.0)
MCHC: 34.9 g/dL (ref 30.0–36.0)
MCV: 102.3 fL — ABNORMAL HIGH (ref 80.0–100.0)
Monocytes Absolute: 0.6 10*3/uL (ref 0.1–1.0)
Monocytes Relative: 7 %
Neutro Abs: 5.4 10*3/uL (ref 1.7–7.7)
Neutrophils Relative %: 69 %
Platelet Count: 206 10*3/uL (ref 150–400)
RBC: 4.26 MIL/uL (ref 4.22–5.81)
RDW: 12.5 % (ref 11.5–15.5)
WBC Count: 7.7 10*3/uL (ref 4.0–10.5)
nRBC: 0 % (ref 0.0–0.2)

## 2023-09-20 LAB — FERRITIN: Ferritin: 604 ng/mL — ABNORMAL HIGH (ref 24–336)

## 2023-09-20 NOTE — Progress Notes (Signed)
[name] [name] presents today for phlebotomy per MD orders. Phlebotomy procedure started at 1518 and ended at 1531. 520 cc removed. Patient tolerated procedure well. IV needle removed intact. Pt remained for 30 min post procedure.  VSS at discharge.  Assisted to lobby via wheelchair by son-in-law.

## 2023-09-20 NOTE — Telephone Encounter (Signed)
[name] [phone] from [facility] called and said the patient blood pressure 140/66 and when standing it was 116-60 no dizzyness but after 2-3 minutes of standing it improves to 122/66. Caregiver stop giving the pt hydrochlorothiazide

## 2023-09-20 NOTE — Patient Instructions (Signed)

## 2023-09-20 NOTE — Telephone Encounter (Signed)
Noted. Please have patient remain off hydrochlorothiazide BP pill. I will remove from his med list.

## 2023-09-23 DIAGNOSIS — I10 Essential (primary) hypertension: Secondary | ICD-10-CM | POA: Diagnosis not present

## 2023-09-23 DIAGNOSIS — R292 Abnormal reflex: Secondary | ICD-10-CM | POA: Diagnosis not present

## 2023-09-23 DIAGNOSIS — I69398 Other sequelae of cerebral infarction: Secondary | ICD-10-CM | POA: Diagnosis not present

## 2023-09-23 DIAGNOSIS — I2510 Atherosclerotic heart disease of native coronary artery without angina pectoris: Secondary | ICD-10-CM | POA: Diagnosis not present

## 2023-09-23 DIAGNOSIS — E7849 Other hyperlipidemia: Secondary | ICD-10-CM | POA: Diagnosis not present

## 2023-09-23 DIAGNOSIS — N289 Disorder of kidney and ureter, unspecified: Secondary | ICD-10-CM | POA: Diagnosis not present

## 2023-09-23 DIAGNOSIS — E119 Type 2 diabetes mellitus without complications: Secondary | ICD-10-CM | POA: Diagnosis not present

## 2023-09-23 DIAGNOSIS — R2689 Other abnormalities of gait and mobility: Secondary | ICD-10-CM | POA: Diagnosis not present

## 2023-09-23 DIAGNOSIS — M75102 Unspecified rotator cuff tear or rupture of left shoulder, not specified as traumatic: Secondary | ICD-10-CM | POA: Diagnosis not present

## 2023-09-23 NOTE — Telephone Encounter (Signed)
Called gave information to wife. Will call if any other questions.

## 2023-09-24 DIAGNOSIS — I10 Essential (primary) hypertension: Secondary | ICD-10-CM | POA: Diagnosis not present

## 2023-09-24 DIAGNOSIS — E119 Type 2 diabetes mellitus without complications: Secondary | ICD-10-CM | POA: Diagnosis not present

## 2023-09-24 DIAGNOSIS — M75102 Unspecified rotator cuff tear or rupture of left shoulder, not specified as traumatic: Secondary | ICD-10-CM | POA: Diagnosis not present

## 2023-09-24 DIAGNOSIS — N289 Disorder of kidney and ureter, unspecified: Secondary | ICD-10-CM | POA: Diagnosis not present

## 2023-09-24 DIAGNOSIS — R292 Abnormal reflex: Secondary | ICD-10-CM | POA: Diagnosis not present

## 2023-09-24 DIAGNOSIS — I2510 Atherosclerotic heart disease of native coronary artery without angina pectoris: Secondary | ICD-10-CM | POA: Diagnosis not present

## 2023-09-24 DIAGNOSIS — R2689 Other abnormalities of gait and mobility: Secondary | ICD-10-CM | POA: Diagnosis not present

## 2023-09-24 DIAGNOSIS — E7849 Other hyperlipidemia: Secondary | ICD-10-CM | POA: Diagnosis not present

## 2023-09-24 DIAGNOSIS — I69398 Other sequelae of cerebral infarction: Secondary | ICD-10-CM | POA: Diagnosis not present

## 2023-09-24 NOTE — Telephone Encounter (Addendum)
Yes, discontinue hydrochlorothiazide.  Presuming these measurements are in inches, it looks like he needs size large.  Can they take a verbal order or do we need to fax a prescription to a medical supply store?

## 2023-09-24 NOTE — Telephone Encounter (Signed)
Alexis from [facility] called in and needed to verify that patient is to discontinue hydrochlorothiazide. She also stated that patient needs an order for compression socks. His measurements are: Left leg: ankle- 9.5, calf- 14, and heel to toe- 21 Right leg: ankle 9.5, calf- 14.25, and heel to toe 21.

## 2023-09-25 NOTE — Telephone Encounter (Signed)
No number provided for [name] with [name]. Left voicemail for [name] with [name] to return call to office.

## 2023-09-26 DIAGNOSIS — N289 Disorder of kidney and ureter, unspecified: Secondary | ICD-10-CM | POA: Diagnosis not present

## 2023-09-26 DIAGNOSIS — R292 Abnormal reflex: Secondary | ICD-10-CM | POA: Diagnosis not present

## 2023-09-26 DIAGNOSIS — I10 Essential (primary) hypertension: Secondary | ICD-10-CM | POA: Diagnosis not present

## 2023-09-26 DIAGNOSIS — E119 Type 2 diabetes mellitus without complications: Secondary | ICD-10-CM | POA: Diagnosis not present

## 2023-09-26 DIAGNOSIS — R2689 Other abnormalities of gait and mobility: Secondary | ICD-10-CM | POA: Diagnosis not present

## 2023-09-26 DIAGNOSIS — M75102 Unspecified rotator cuff tear or rupture of left shoulder, not specified as traumatic: Secondary | ICD-10-CM | POA: Diagnosis not present

## 2023-09-26 DIAGNOSIS — E7849 Other hyperlipidemia: Secondary | ICD-10-CM | POA: Diagnosis not present

## 2023-09-26 DIAGNOSIS — I69398 Other sequelae of cerebral infarction: Secondary | ICD-10-CM | POA: Diagnosis not present

## 2023-09-26 DIAGNOSIS — I2510 Atherosclerotic heart disease of native coronary artery without angina pectoris: Secondary | ICD-10-CM | POA: Diagnosis not present

## 2023-09-27 NOTE — Telephone Encounter (Signed)
Called and advised patients wife DME order is ready to be picked up for compression socks. She will have patients caregiver come pickup

## 2023-09-27 NOTE — Telephone Encounter (Addendum)
Called and spoke with [name] from [facility], she stated we would need to complete a DME order and have patients caregiver take this to a medical supply store for compression socks.  They are continuing to hold the hydrochlorothiazide, Jon Gills states the patient is asymptomatic with the orthostatic BP and recovers after a minute or two of standing, she will update if patient becomes symptomatic.

## 2023-09-27 NOTE — Telephone Encounter (Signed)
DME order placed in epic.  Form printed and signed, placed in Kelli's inbox.

## 2023-09-27 NOTE — Addendum Note (Signed)
Addended by: Doreene Nest on: 09/27/2023 02:04 PM   Modules accepted: Orders

## 2023-09-28 DIAGNOSIS — I639 Cerebral infarction, unspecified: Secondary | ICD-10-CM | POA: Diagnosis not present

## 2023-09-28 DIAGNOSIS — R296 Repeated falls: Secondary | ICD-10-CM | POA: Diagnosis not present

## 2023-09-28 DIAGNOSIS — R2681 Unsteadiness on feet: Secondary | ICD-10-CM | POA: Diagnosis not present

## 2023-09-30 ENCOUNTER — Ambulatory Visit: Payer: Medicare PPO | Admitting: Gastroenterology

## 2023-09-30 ENCOUNTER — Telehealth: Payer: Self-pay | Admitting: Neurology

## 2023-09-30 DIAGNOSIS — I69398 Other sequelae of cerebral infarction: Secondary | ICD-10-CM | POA: Diagnosis not present

## 2023-09-30 DIAGNOSIS — I10 Essential (primary) hypertension: Secondary | ICD-10-CM | POA: Diagnosis not present

## 2023-09-30 DIAGNOSIS — R292 Abnormal reflex: Secondary | ICD-10-CM | POA: Diagnosis not present

## 2023-09-30 DIAGNOSIS — M75102 Unspecified rotator cuff tear or rupture of left shoulder, not specified as traumatic: Secondary | ICD-10-CM | POA: Diagnosis not present

## 2023-09-30 DIAGNOSIS — N289 Disorder of kidney and ureter, unspecified: Secondary | ICD-10-CM | POA: Diagnosis not present

## 2023-09-30 DIAGNOSIS — I2510 Atherosclerotic heart disease of native coronary artery without angina pectoris: Secondary | ICD-10-CM | POA: Diagnosis not present

## 2023-09-30 DIAGNOSIS — E7849 Other hyperlipidemia: Secondary | ICD-10-CM | POA: Diagnosis not present

## 2023-09-30 DIAGNOSIS — E119 Type 2 diabetes mellitus without complications: Secondary | ICD-10-CM | POA: Diagnosis not present

## 2023-09-30 DIAGNOSIS — R2689 Other abnormalities of gait and mobility: Secondary | ICD-10-CM | POA: Diagnosis not present

## 2023-09-30 NOTE — Telephone Encounter (Signed)
[name] nurse from [facility] [phone] called stating that patient is having increased weakness, delayed speech and decreased balance

## 2023-10-01 DIAGNOSIS — I69398 Other sequelae of cerebral infarction: Secondary | ICD-10-CM | POA: Diagnosis not present

## 2023-10-01 DIAGNOSIS — E7849 Other hyperlipidemia: Secondary | ICD-10-CM | POA: Diagnosis not present

## 2023-10-01 DIAGNOSIS — M75102 Unspecified rotator cuff tear or rupture of left shoulder, not specified as traumatic: Secondary | ICD-10-CM | POA: Diagnosis not present

## 2023-10-01 DIAGNOSIS — I2510 Atherosclerotic heart disease of native coronary artery without angina pectoris: Secondary | ICD-10-CM | POA: Diagnosis not present

## 2023-10-01 DIAGNOSIS — R2689 Other abnormalities of gait and mobility: Secondary | ICD-10-CM | POA: Diagnosis not present

## 2023-10-01 DIAGNOSIS — R292 Abnormal reflex: Secondary | ICD-10-CM | POA: Diagnosis not present

## 2023-10-01 DIAGNOSIS — E119 Type 2 diabetes mellitus without complications: Secondary | ICD-10-CM | POA: Diagnosis not present

## 2023-10-01 DIAGNOSIS — I10 Essential (primary) hypertension: Secondary | ICD-10-CM | POA: Diagnosis not present

## 2023-10-01 DIAGNOSIS — N289 Disorder of kidney and ureter, unspecified: Secondary | ICD-10-CM | POA: Diagnosis not present

## 2023-10-01 NOTE — Telephone Encounter (Signed)
Per patient daughter [name], Her father had the delayed speech on and off. She feels like he may have had some mini strokes. But unsure.

## 2023-10-02 ENCOUNTER — Telehealth: Payer: Self-pay | Admitting: Primary Care

## 2023-10-02 ENCOUNTER — Telehealth: Payer: Self-pay | Admitting: Neurology

## 2023-10-02 DIAGNOSIS — I2510 Atherosclerotic heart disease of native coronary artery without angina pectoris: Secondary | ICD-10-CM | POA: Diagnosis not present

## 2023-10-02 DIAGNOSIS — I69398 Other sequelae of cerebral infarction: Secondary | ICD-10-CM | POA: Diagnosis not present

## 2023-10-02 DIAGNOSIS — E7849 Other hyperlipidemia: Secondary | ICD-10-CM | POA: Diagnosis not present

## 2023-10-02 DIAGNOSIS — I10 Essential (primary) hypertension: Secondary | ICD-10-CM | POA: Diagnosis not present

## 2023-10-02 DIAGNOSIS — R292 Abnormal reflex: Secondary | ICD-10-CM | POA: Diagnosis not present

## 2023-10-02 DIAGNOSIS — R2689 Other abnormalities of gait and mobility: Secondary | ICD-10-CM | POA: Diagnosis not present

## 2023-10-02 DIAGNOSIS — N289 Disorder of kidney and ureter, unspecified: Secondary | ICD-10-CM | POA: Diagnosis not present

## 2023-10-02 DIAGNOSIS — M75102 Unspecified rotator cuff tear or rupture of left shoulder, not specified as traumatic: Secondary | ICD-10-CM | POA: Diagnosis not present

## 2023-10-02 DIAGNOSIS — E119 Type 2 diabetes mellitus without complications: Secondary | ICD-10-CM | POA: Diagnosis not present

## 2023-10-02 NOTE — Telephone Encounter (Signed)
noted 

## 2023-10-02 NOTE — Telephone Encounter (Signed)
[name] [name] from [facility] called to let Chestine Spore know the pt had a fall this morning, 10/2. [name] [name] states there is no reported injuries, pt states he legs suddenly gave out. [name] [name] states the pt mentioned he was currently on Metamucil. [name] [name] wanted to ask Chestine Spore if that was fine for the pt to take & if so, at what dosage? Call back # [phone], secured

## 2023-10-02 NOTE — Telephone Encounter (Signed)
[facility] PT called stating that Patient fell , legs gave out while walking to the bathroom. No injury . Any questions call Beatris Si [phone]

## 2023-10-02 NOTE — Telephone Encounter (Signed)
Noted.  Glad there were no injuries.  He can take Metamucil as needed to help with bowel movements as long as he is not experiencing diarrhea.

## 2023-10-03 NOTE — Telephone Encounter (Signed)
Left secure voicemail with Rosanne Ashing from [facility] advising of [employer]. Advised to callback with further questions.

## 2023-10-04 ENCOUNTER — Inpatient Hospital Stay: Payer: Medicare PPO

## 2023-10-04 ENCOUNTER — Inpatient Hospital Stay: Payer: Medicare PPO | Attending: Hematology and Oncology

## 2023-10-04 ENCOUNTER — Telehealth: Payer: Self-pay | Admitting: Neurology

## 2023-10-04 ENCOUNTER — Other Ambulatory Visit: Payer: Self-pay | Admitting: Neurology

## 2023-10-04 DIAGNOSIS — R7989 Other specified abnormal findings of blood chemistry: Secondary | ICD-10-CM | POA: Diagnosis not present

## 2023-10-04 LAB — CBC WITH DIFFERENTIAL (CANCER CENTER ONLY)
Abs Immature Granulocytes: 0.07 10*3/uL (ref 0.00–0.07)
Basophils Absolute: 0 10*3/uL (ref 0.0–0.1)
Basophils Relative: 1 %
Eosinophils Absolute: 0.1 10*3/uL (ref 0.0–0.5)
Eosinophils Relative: 2 %
HCT: 44.2 % (ref 39.0–52.0)
Hemoglobin: 15.5 g/dL (ref 13.0–17.0)
Immature Granulocytes: 1 %
Lymphocytes Relative: 20 %
Lymphs Abs: 1.5 10*3/uL (ref 0.7–4.0)
MCH: 36 pg — ABNORMAL HIGH (ref 26.0–34.0)
MCHC: 35.1 g/dL (ref 30.0–36.0)
MCV: 102.8 fL — ABNORMAL HIGH (ref 80.0–100.0)
Monocytes Absolute: 0.6 10*3/uL (ref 0.1–1.0)
Monocytes Relative: 8 %
Neutro Abs: 5.2 10*3/uL (ref 1.7–7.7)
Neutrophils Relative %: 68 %
Platelet Count: 252 10*3/uL (ref 150–400)
RBC: 4.3 MIL/uL (ref 4.22–5.81)
RDW: 13.6 % (ref 11.5–15.5)
WBC Count: 7.5 10*3/uL (ref 4.0–10.5)
nRBC: 0 % (ref 0.0–0.2)

## 2023-10-04 LAB — FERRITIN: Ferritin: 427 ng/mL — ABNORMAL HIGH (ref 24–336)

## 2023-10-04 NOTE — Patient Instructions (Signed)

## 2023-10-04 NOTE — Telephone Encounter (Signed)
[name] [name] with OT called and states the patient needs an order for a bedside commode. He keeps falling and they think this could help him. Jon Gills would like a call back from sheena. She wants to know how and when the order is placed. [phone]

## 2023-10-04 NOTE — Telephone Encounter (Signed)
LMOVM per DR.Jaffe okay to order.

## 2023-10-04 NOTE — Progress Notes (Signed)
[name] [name] presents today for phlebotomy per MD orders. Phlebotomy procedure started at 1553 and ended at 1558. 529 grams removed via 16G LAC Patient observed for 30 minutes after procedure without any incident. Patient tolerated procedure well. IV needle removed intact.

## 2023-10-08 DIAGNOSIS — N289 Disorder of kidney and ureter, unspecified: Secondary | ICD-10-CM | POA: Diagnosis not present

## 2023-10-08 DIAGNOSIS — R292 Abnormal reflex: Secondary | ICD-10-CM | POA: Diagnosis not present

## 2023-10-08 DIAGNOSIS — E7849 Other hyperlipidemia: Secondary | ICD-10-CM | POA: Diagnosis not present

## 2023-10-08 DIAGNOSIS — R2689 Other abnormalities of gait and mobility: Secondary | ICD-10-CM | POA: Diagnosis not present

## 2023-10-08 DIAGNOSIS — I2510 Atherosclerotic heart disease of native coronary artery without angina pectoris: Secondary | ICD-10-CM | POA: Diagnosis not present

## 2023-10-08 DIAGNOSIS — M75102 Unspecified rotator cuff tear or rupture of left shoulder, not specified as traumatic: Secondary | ICD-10-CM | POA: Diagnosis not present

## 2023-10-08 DIAGNOSIS — E119 Type 2 diabetes mellitus without complications: Secondary | ICD-10-CM | POA: Diagnosis not present

## 2023-10-08 DIAGNOSIS — I10 Essential (primary) hypertension: Secondary | ICD-10-CM | POA: Diagnosis not present

## 2023-10-08 DIAGNOSIS — I69398 Other sequelae of cerebral infarction: Secondary | ICD-10-CM | POA: Diagnosis not present

## 2023-10-09 DIAGNOSIS — E7849 Other hyperlipidemia: Secondary | ICD-10-CM | POA: Diagnosis not present

## 2023-10-09 DIAGNOSIS — R2689 Other abnormalities of gait and mobility: Secondary | ICD-10-CM | POA: Diagnosis not present

## 2023-10-09 DIAGNOSIS — I2510 Atherosclerotic heart disease of native coronary artery without angina pectoris: Secondary | ICD-10-CM | POA: Diagnosis not present

## 2023-10-09 DIAGNOSIS — I10 Essential (primary) hypertension: Secondary | ICD-10-CM | POA: Diagnosis not present

## 2023-10-09 DIAGNOSIS — E119 Type 2 diabetes mellitus without complications: Secondary | ICD-10-CM | POA: Diagnosis not present

## 2023-10-09 DIAGNOSIS — R292 Abnormal reflex: Secondary | ICD-10-CM | POA: Diagnosis not present

## 2023-10-09 DIAGNOSIS — M75102 Unspecified rotator cuff tear or rupture of left shoulder, not specified as traumatic: Secondary | ICD-10-CM | POA: Diagnosis not present

## 2023-10-09 DIAGNOSIS — N289 Disorder of kidney and ureter, unspecified: Secondary | ICD-10-CM | POA: Diagnosis not present

## 2023-10-09 DIAGNOSIS — I69398 Other sequelae of cerebral infarction: Secondary | ICD-10-CM | POA: Diagnosis not present

## 2023-10-10 NOTE — Telephone Encounter (Signed)
Noted  They can switch back to the fluid BP pill called hydrochlorothiazide. If they decide to resume this then he would need to stop losartan BP pill.   Let me know what they decide.

## 2023-10-10 NOTE — Telephone Encounter (Signed)
Called patients wife and reviewed all information. She verbalized understanding.  She stated patient has not been taking losartan. They will restart hydrochlorothiazide and watch BP.  Patients wife states he has a procedure every 2 weeks to pull out the iron in his blood due to excessive high amounts of iron. She wondered if this could have any effect on swelling.  Will call if any further questions.

## 2023-10-10 NOTE — Telephone Encounter (Signed)
Called and spoke with patients wife, she preferred order to be faxed to [facility] medical supply. Advised her I would do this today. Order has been faxed.   She would like to know if patient should be given fluid pill again due to persistent swelling?

## 2023-10-10 NOTE — Telephone Encounter (Signed)
Noted, will remove losartan from list. Will add hydrochlorothiazide 12.5 mg.  Yes, needs to closely watch BP

## 2023-10-10 NOTE — Addendum Note (Signed)
Addended by: Doreene Nest on: 10/10/2023 11:09 AM   Modules accepted: Orders

## 2023-10-10 NOTE — Telephone Encounter (Signed)
Pt's wife, Dwana Curd, called asking if the order for pt's compression socks were still at our office? Advised vera I didn't see the order up front, asked Dwana Curd did the pt's caregiver ever pick up the order? Dwana Curd stated no, nobody has the order. [name] asked if our office can fax order over to the med supply store instead of picking it up? Dwana Curd states she believed socks were coming from [employer] on [address] in [place], but isn't certain. Dwana Curd also mentioned since pt has been off meds, his bp has been good but his feet are still swelling. [name] asked should pt go back on fluid meds? Call back # [phone]

## 2023-10-11 ENCOUNTER — Telehealth: Payer: Self-pay | Admitting: Primary Care

## 2023-10-11 DIAGNOSIS — I10 Essential (primary) hypertension: Secondary | ICD-10-CM

## 2023-10-11 DIAGNOSIS — E1165 Type 2 diabetes mellitus with hyperglycemia: Secondary | ICD-10-CM

## 2023-10-11 MED ORDER — HYDROCHLOROTHIAZIDE 12.5 MG PO TABS: 12.5000 mg | ORAL_TABLET | Freq: Every day | ORAL | 0 refills | Status: DC

## 2023-10-11 MED ORDER — ACCU-CHEK GUIDE VI STRP: ORAL_STRIP | 3 refills | Status: AC

## 2023-10-11 NOTE — Telephone Encounter (Signed)
Please call patient's wife... the assumption was that he had hydrochlorothiazide 12.5 mg tablets at home. Now that I know that he does not, I will send this to his pharmacy. Make sure they watch BP closely

## 2023-10-11 NOTE — Telephone Encounter (Signed)
Called patients wife and reviewed all information. She verbalized understanding. Will call if any further questions.  

## 2023-10-11 NOTE — Telephone Encounter (Signed)
Prescription Request  10/11/2023  LOV: 07/12/2023  What is the name of the medication or equipment?  glucose blood (ACCU-CHEK GUIDE) test strip   Have you contacted your pharmacy to request a refill? No   Which pharmacy would you like this sent to?  [id number] [place], [place] - [address] [name] [address] Phone: [phone] Fax: [phone]    Patient notified that their request is being sent to the clinical staff for review and that they should receive a response within 2 business days.   Please advise at [facility] [phone]

## 2023-10-11 NOTE — Telephone Encounter (Signed)
Refills sent to pharmacy. 

## 2023-10-11 NOTE — Telephone Encounter (Signed)
Patient called in to check on the fluid medication  that was suppose to be called in to the pharmacy on yesterday.      hydrochlorothiazide (HYDRODIURIL) 12.5 MG tablet

## 2023-10-13 ENCOUNTER — Emergency Department (HOSPITAL_COMMUNITY): Payer: Medicare PPO

## 2023-10-13 ENCOUNTER — Emergency Department (HOSPITAL_COMMUNITY)
Admission: EM | Admit: 2023-10-13 | Discharge: 2023-10-13 | Disposition: A | Discharge status: Home / Self Care | Payer: Medicare PPO | Attending: Emergency Medicine | Admitting: Emergency Medicine

## 2023-10-13 ENCOUNTER — Other Ambulatory Visit: Payer: Self-pay

## 2023-10-13 ENCOUNTER — Encounter (HOSPITAL_COMMUNITY): Payer: Self-pay

## 2023-10-13 DIAGNOSIS — I10 Essential (primary) hypertension: Secondary | ICD-10-CM | POA: Diagnosis not present

## 2023-10-13 DIAGNOSIS — R079 Chest pain, unspecified: Secondary | ICD-10-CM | POA: Diagnosis not present

## 2023-10-13 DIAGNOSIS — R600 Localized edema: Secondary | ICD-10-CM | POA: Insufficient documentation

## 2023-10-13 DIAGNOSIS — E8779 Other fluid overload: Secondary | ICD-10-CM | POA: Diagnosis not present

## 2023-10-13 DIAGNOSIS — R0602 Shortness of breath: Secondary | ICD-10-CM | POA: Diagnosis not present

## 2023-10-13 DIAGNOSIS — J9811 Atelectasis: Secondary | ICD-10-CM | POA: Diagnosis not present

## 2023-10-13 DIAGNOSIS — R609 Edema, unspecified: Secondary | ICD-10-CM

## 2023-10-13 DIAGNOSIS — R339 Retention of urine, unspecified: Secondary | ICD-10-CM | POA: Diagnosis not present

## 2023-10-13 DIAGNOSIS — R5383 Other fatigue: Secondary | ICD-10-CM | POA: Diagnosis not present

## 2023-10-13 DIAGNOSIS — Z951 Presence of aortocoronary bypass graft: Secondary | ICD-10-CM | POA: Insufficient documentation

## 2023-10-13 DIAGNOSIS — E119 Type 2 diabetes mellitus without complications: Secondary | ICD-10-CM | POA: Insufficient documentation

## 2023-10-13 DIAGNOSIS — Z8673 Personal history of transient ischemic attack (TIA), and cerebral infarction without residual deficits: Secondary | ICD-10-CM | POA: Insufficient documentation

## 2023-10-13 DIAGNOSIS — Z79899 Other long term (current) drug therapy: Secondary | ICD-10-CM | POA: Insufficient documentation

## 2023-10-13 DIAGNOSIS — R0989 Other specified symptoms and signs involving the circulatory and respiratory systems: Secondary | ICD-10-CM | POA: Diagnosis not present

## 2023-10-13 DIAGNOSIS — I2510 Atherosclerotic heart disease of native coronary artery without angina pectoris: Secondary | ICD-10-CM | POA: Diagnosis not present

## 2023-10-13 DIAGNOSIS — R7989 Other specified abnormal findings of blood chemistry: Secondary | ICD-10-CM | POA: Diagnosis not present

## 2023-10-13 DIAGNOSIS — Z7902 Long term (current) use of antithrombotics/antiplatelets: Secondary | ICD-10-CM | POA: Insufficient documentation

## 2023-10-13 LAB — COMPREHENSIVE METABOLIC PANEL
ALT: 16 U/L (ref 0–44)
AST: 20 U/L (ref 15–41)
Albumin: 3.2 g/dL — ABNORMAL LOW (ref 3.5–5.0)
Alkaline Phosphatase: 84 U/L (ref 38–126)
Anion gap: 10 (ref 5–15)
BUN: 11 mg/dL (ref 8–23)
CO2: 32 mmol/L (ref 22–32)
Calcium: 8.7 mg/dL — ABNORMAL LOW (ref 8.9–10.3)
Chloride: 99 mmol/L (ref 98–111)
Creatinine, Ser: 1.07 mg/dL (ref 0.61–1.24)
GFR, Estimated: >60 mL/min (ref >60)
Glucose, Bld: 124 mg/dL — ABNORMAL HIGH (ref 70–99)
Potassium: 3.3 mmol/L — ABNORMAL LOW (ref 3.5–5.1)
Sodium: 141 mmol/L (ref 135–145)
Total Bilirubin: 1.1 mg/dL (ref 0.3–1.2)
Total Protein: 6.4 g/dL — ABNORMAL LOW (ref 6.5–8.1)

## 2023-10-13 LAB — CBC
HCT: 41.4 % (ref 39.0–52.0)
Hemoglobin: 13.9 g/dL (ref 13.0–17.0)
MCH: 35.5 pg — ABNORMAL HIGH (ref 26.0–34.0)
MCHC: 33.6 g/dL (ref 30.0–36.0)
MCV: 105.9 fL — ABNORMAL HIGH (ref 80.0–100.0)
Platelets: 216 10*3/uL (ref 150–400)
RBC: 3.91 MIL/uL — ABNORMAL LOW (ref 4.22–5.81)
RDW: 14.4 % (ref 11.5–15.5)
WBC: 6.6 10*3/uL (ref 4.0–10.5)
nRBC: 0 % (ref 0.0–0.2)

## 2023-10-13 LAB — BRAIN NATRIURETIC PEPTIDE: B Natriuretic Peptide: 233.6 pg/mL — ABNORMAL HIGH (ref 0.0–100.0)

## 2023-10-13 NOTE — ED Provider Notes (Signed)
[place] EMERGENCY DEPARTMENT AT [facility] Provider Note   CSN: [id number] Arrival date & time: 10/13/23  1645     History  Chief Complaint  Patient presents with   Leg Swelling   Fatigue    [name] [name] is a 82 y.o. male with history of hypertension, CVA, type 2 diabetes, CAD, hyperlipidemia, hereditary hemochromatosis, who presents the emergency department complaining of generalized weakness and leg swelling.  Patient here with his daughter who is able to assist with history.  That patient has been more tired the past few days.  His blood pressure medication regimen was recently changed as he was having some low readings with standing.  They noted that for the past 3 days or so, patient has had more swelling in his feet.  He used to be on hydrochlorothiazide.  EMS came and saw him 2 nights ago and recommended that he could likely restart his diuretic if he was experiencing more swelling.  He took the first dose yesterday, and was up all night urinating.  Today he was feeling more tired, and his granddaughter and home health caregiver were concerned.  They called EMS to have him brought to the hospital.  Patient himself is just complaining of feeling more tired.  His daughter states that she does not have great concerns right now, but she wanted to make sure that everything looked normal.  HPI     Home Medications Prior to Admission medications   Medication Sig Start Date End Date Taking? Authorizing Provider  atorvastatin (LIPITOR) 10 MG tablet TAKE 1 TABLET BY MOUTH EVERY DAY FOR CHOLESTEROL 08/24/23   Doreene Nest, NP  cholecalciferol (VITAMIN D3) 25 MCG (1000 UNIT) tablet Take 1,000 Units by mouth daily.    [provider]  clopidogrel (PLAVIX) 75 MG tablet TAKE 1 TABLET BY MOUTH EVERY DAY 07/26/23   Doreene Nest, NP  cyanocobalamin (VITAMIN B12) 500 MCG tablet Take 500 mcg by mouth daily.    [provider]  glucose blood (ACCU-CHEK  GUIDE) test strip Use as instructed 10/11/23   Doreene Nest, NP  hydrochlorothiazide (HYDRODIURIL) 12.5 MG tablet Take 1 tablet (12.5 mg total) by mouth daily. for blood pressure. 10/11/23   Doreene Nest, NP  VALIUM 2 MG tablet Take by mouth. 06/21/23   [provider]      Allergies    Niacin and Ativan [lorazepam]    Review of Systems   Review of Systems  Constitutional:  Positive for fatigue.  Cardiovascular:  Positive for leg swelling.  All other systems reviewed and are negative.   Physical Exam Updated Vital Signs BP (!) 169/83   Pulse 78   Temp 98.3 F (36.8 C) (Oral)   Resp 19   SpO2 94%  Physical Exam Vitals and nursing note reviewed.  Constitutional:      Appearance: Normal appearance.  HENT:     Head: Normocephalic and atraumatic.  Eyes:     Conjunctiva/sclera: Conjunctivae normal.  Cardiovascular:     Rate and Rhythm: Normal rate and regular rhythm.  Pulmonary:     Effort: Pulmonary effort is normal. No respiratory distress.     Breath sounds: Normal breath sounds.  Abdominal:     General: There is no distension.     Palpations: Abdomen is soft.     Tenderness: There is no abdominal tenderness.  Musculoskeletal:     Right lower leg: 1+ Pitting Edema present.     Left lower leg:  2+ Pitting Edema present.  Skin:    General: Skin is warm and dry.  Neurological:     General: No focal deficit present.     Mental Status: He is alert.     ED Results / Procedures / Treatments   Labs (all labs ordered are listed, but only abnormal results are displayed) Labs Reviewed  CBC - Abnormal; Notable for the following components:      Result Value   RBC 3.91 (*)    MCV 105.9 (*)    MCH 35.5 (*)    All other components within normal limits  COMPREHENSIVE METABOLIC PANEL - Abnormal; Notable for the following components:   Potassium 3.3 (*)    Glucose, Bld 124 (*)    Calcium 8.7 (*)    Total Protein 6.4 (*)    Albumin 3.2 (*)    All other  components within normal limits  BRAIN NATRIURETIC PEPTIDE - Abnormal; Notable for the following components:   B Natriuretic Peptide 233.6 (*)    All other components within normal limits    EKG EKG Interpretation Date/Time:  Sunday October 13 2023 18:08:07 EDT Ventricular Rate:  75 PR Interval:  190 QRS Duration:  100 QT Interval:  391 QTC Calculation: 437 R Axis:   67  Text Interpretation: Sinus rhythm Borderline repolarization abnormality No significant change since last tracing Confirmed by Vanetta Mulders [phone]) on 10/13/2023 6:14:33 PM  Radiology DG Chest 2 View  Result Date: 10/13/2023 CLINICAL DATA:  Shortness of breath and CP. EXAM: CHEST - 2 VIEW COMPARISON:  Chest radiograph dated 04/02/2022. FINDINGS: Mild cardiomegaly with mild vascular congestion. There is mild trace shin of the right hemidiaphragm. Bibasilar atelectasis. Pneumonia is less likely. No pneumothorax. 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 No acute osseous pathology. IMPRESSION: 1. Mild cardiomegaly with mild vascular congestion. 2. Bibasilar atelectasis. Electronically Signed   By: Elgie Collard M.D.   On: 10/13/2023 19:20    Procedures Procedures    Medications Ordered in ED Medications - No data to display  ED Course/ Medical Decision Making/ A&P                                 Medical Decision Making Amount and/or Complexity of Data Reviewed Labs: ordered. Radiology: ordered.  This patient is a 82 y.o. male  who presents to the ED for concern of fatigue, leg swelling.   Differential diagnoses prior to evaluation: The emergent differential diagnosis includes, but is not limited to,  sepsis, CHF, fluid retention, pneumonia, ACS. This is not an exhaustive differential.   Past Medical History / Co-morbidities / Social History: hypertension, CVA, type 2 diabetes, CAD, hyperlipidemia, hereditary hemochromatosis  Additional history: Chart reviewed. Pertinent results include: Most  recent echocardiogram from March 2023 with LVEF 55-60%  Physical Exam: Physical exam performed. The pertinent findings include: Hypertensive, otherwise normal vital signs.  No acute distress.  Bilateral pitting edema to the level of the mid shins.  Lab Tests/Imaging studies: I personally interpreted labs/imaging and the pertinent results include:  CBC at baseline.  CMP grossly unremarkable.  BNP elevated to 233.6.  Chest x-ray with mild cardiomegaly, mild vascular congestion, no effusions.  I agree with the radiologist interpretation.  Cardiac monitoring: EKG obtained and interpreted by myself and attending physician which shows: Sinus rhythm   Disposition: After consideration of the diagnostic results and the patients response to treatment, I feel that emergency department workup  does not suggest an emergent condition requiring admission or immediate intervention beyond what has been performed at this time. The plan is: Discharge to home with ongoing management of mild fluid retention.  Patient on hydrochlorothiazide at home and urinating well with this.  I recommended that he follow-up with his cardiologist as he has not had an echo in over a year.  He does have a mildly elevated BNP today, but no clinical concern for CHF exacerbation requiring hospitalization.  He is ambulating at his baseline, not requiring oxygen, no distress.  Patient and his daughter both feel comfortable with this plan, they plan to call his cardiologist for follow-up tomorrow.  The patient is safe for discharge and has been instructed to return immediately for worsening symptoms, change in symptoms or any other concerns.  Final Clinical Impression(s) / ED Diagnoses Final diagnoses:  Fluid retention    Rx / DC Orders ED Discharge Orders     None      Portions of this report may have been transcribed using voice recognition software. Every effort was made to ensure accuracy; however, inadvertent computerized  transcription errors may be present.    Jeanella Flattery 10/13/23 2015    Vanetta Mulders, MD 10/13/23 [phone]

## 2023-10-13 NOTE — ED Triage Notes (Signed)
BIBA from home for generalized weakness, edema in bilateral legs Hx of CVA, CHF, DM, HTN. 138/62 BP 96% room air 80 HR 14 RR 131 cbg

## 2023-10-13 NOTE — Discharge Instructions (Signed)
You were seen in the ER for swelling of your legs.  As we discussed, your blood work and chest x-ray did not look very concerning. I recommend continuing your hydrochlorothiazide.  Please monitor the swelling in your legs closely.  I also recommend following up with a cardiologist.  Please give them a call tomorrow and request an ER follow-up appointment.  Continue to monitor how you're doing and return to the ER for new or worsening symptoms.

## 2023-10-15 ENCOUNTER — Telehealth: Payer: Self-pay | Admitting: Primary Care

## 2023-10-15 NOTE — Telephone Encounter (Signed)
Patient's wife contacted the office, wanted to update on patient since he had been to the ER. Wife says pt's feet were swelling and they called EMS Saturday and Sunday, patient went to ED Sunday. Patient was told to follow up with cardiology, they have made an appointment but they won't be able to see pt until February. Patient's wife is concerned and does not want him to wait this long, should pt be seen sooner? Wife wanted Jae Dire to be aware and make any suggestions or things they should do. Please advise wife when able of respons

## 2023-10-15 NOTE — Telephone Encounter (Signed)
Called and spoke with patients wife. Thoroughly went through patients med list, everything is up to date. He has resumed the hydrochlorothiazide, this is his second day he has taken it. She states it has not helped the swelling so far, only caused him to urinate all night long. She opted to schedule an appt for patient to discuss in more detail. Scheduled 40 min appt for 10/22.

## 2023-10-15 NOTE — Telephone Encounter (Signed)
Can we make sure that his medication list is up to date?  Did he resume the hydrochlorothiazide?  Does that work for his swelling?  Labs do show that potassium is slightly low but this shouldn't be causing his symptoms.   I am always happy to see him. If so, can we book in a 40 min slot?

## 2023-10-16 DIAGNOSIS — E119 Type 2 diabetes mellitus without complications: Secondary | ICD-10-CM | POA: Diagnosis not present

## 2023-10-16 DIAGNOSIS — E7849 Other hyperlipidemia: Secondary | ICD-10-CM | POA: Diagnosis not present

## 2023-10-16 DIAGNOSIS — I69398 Other sequelae of cerebral infarction: Secondary | ICD-10-CM | POA: Diagnosis not present

## 2023-10-16 DIAGNOSIS — R292 Abnormal reflex: Secondary | ICD-10-CM | POA: Diagnosis not present

## 2023-10-16 DIAGNOSIS — N289 Disorder of kidney and ureter, unspecified: Secondary | ICD-10-CM | POA: Diagnosis not present

## 2023-10-16 DIAGNOSIS — I10 Essential (primary) hypertension: Secondary | ICD-10-CM | POA: Diagnosis not present

## 2023-10-16 DIAGNOSIS — I2510 Atherosclerotic heart disease of native coronary artery without angina pectoris: Secondary | ICD-10-CM | POA: Diagnosis not present

## 2023-10-16 DIAGNOSIS — M75102 Unspecified rotator cuff tear or rupture of left shoulder, not specified as traumatic: Secondary | ICD-10-CM | POA: Diagnosis not present

## 2023-10-16 DIAGNOSIS — R2689 Other abnormalities of gait and mobility: Secondary | ICD-10-CM | POA: Diagnosis not present

## 2023-10-16 NOTE — Telephone Encounter (Signed)
Noted. Given his sedentary lifestyle, coupled with medical history, he needs evaluation to rule out DVT.

## 2023-10-17 ENCOUNTER — Telehealth: Payer: Self-pay

## 2023-10-17 ENCOUNTER — Telehealth: Payer: Self-pay | Admitting: Primary Care

## 2023-10-17 DIAGNOSIS — I2510 Atherosclerotic heart disease of native coronary artery without angina pectoris: Secondary | ICD-10-CM | POA: Diagnosis not present

## 2023-10-17 DIAGNOSIS — Z9889 Other specified postprocedural states: Secondary | ICD-10-CM

## 2023-10-17 DIAGNOSIS — M75102 Unspecified rotator cuff tear or rupture of left shoulder, not specified as traumatic: Secondary | ICD-10-CM | POA: Diagnosis not present

## 2023-10-17 DIAGNOSIS — R600 Localized edema: Secondary | ICD-10-CM

## 2023-10-17 DIAGNOSIS — Z951 Presence of aortocoronary bypass graft: Secondary | ICD-10-CM

## 2023-10-17 DIAGNOSIS — R292 Abnormal reflex: Secondary | ICD-10-CM | POA: Diagnosis not present

## 2023-10-17 DIAGNOSIS — I69398 Other sequelae of cerebral infarction: Secondary | ICD-10-CM | POA: Diagnosis not present

## 2023-10-17 DIAGNOSIS — E119 Type 2 diabetes mellitus without complications: Secondary | ICD-10-CM | POA: Diagnosis not present

## 2023-10-17 DIAGNOSIS — N289 Disorder of kidney and ureter, unspecified: Secondary | ICD-10-CM | POA: Diagnosis not present

## 2023-10-17 DIAGNOSIS — R2689 Other abnormalities of gait and mobility: Secondary | ICD-10-CM | POA: Diagnosis not present

## 2023-10-17 DIAGNOSIS — I10 Essential (primary) hypertension: Secondary | ICD-10-CM | POA: Diagnosis not present

## 2023-10-17 DIAGNOSIS — E7849 Other hyperlipidemia: Secondary | ICD-10-CM | POA: Diagnosis not present

## 2023-10-17 MED ORDER — POTASSIUM CHLORIDE ER 10 MEQ PO TBCR: 10.0000 meq | EXTENDED_RELEASE_TABLET | Freq: Every day | ORAL | 3 refills | Status: DC

## 2023-10-17 NOTE — Telephone Encounter (Signed)
Alexis from [facility] called in and stated that patients BP when standing is dropping. She stated when patient was sitting his BP was 122/55 and when he stood up it dropped down to 70/50. She stated that he is not experiencing any dizziness or anything. She also stated that he was in the ER the other day for swelling and they started him back on hydrochlorothiazide (HYDRODIURIL) 12.5 MG tablet due to swelling. They also recommended that he sees his cardiologist but they don't have anything available until February. They are concerned about heart failure.

## 2023-10-17 NOTE — Telephone Encounter (Signed)
Left message for Jon Gills to call the office back

## 2023-10-17 NOTE — Telephone Encounter (Signed)
Management wanted patient called and see about getting a sooner appointment with Dr. Eden Emms. Patient was in ED 10/13/23 and told to follow-up with cardiology.  Patient's wife wanted patient to be seen sooner than later, since first available with Dr. Eden Emms was February.  Put patient on to see the DOD on Tuesday of next week. Patient will keep appointment with Dr. Eden Emms in February. Will see if Dr. Eden Emms wants to get an echo or any other test due to ER visit and lab work.

## 2023-10-17 NOTE — Telephone Encounter (Signed)
Wendall Stade, MD  to Me    10/17/23  1:35 PM Labs were ok except low K. Fatigue should not be from heart OK to repeat TTE EF has been normal Agree with using hydrochlorothiazide but also call in Kdur 10 meQ daily repeat BMET/BNP in 3 weeks would also check TSH/T4   Called patient's wife (DPR). Placed orders for echo and lab work. Will check TSH/T4 at office visit on Tuesday. Will get BMET and BNP in 3 weeks, hopefully same day as echo. Patient's wife verbalized understanding.

## 2023-10-17 NOTE — Telephone Encounter (Signed)
Pt's spouse called and LVM to inform that pt was experiencing weakness and fatigue over the weekend and went to the ED Sunday evening. Pt's wife states concern due to pt still experiencing weakness but the pt is still scheduled for a phlebotomy tomorrow.   This RN called back and LVM to discuss pt's symptoms. Call back number [phone] provided.

## 2023-10-17 NOTE — Telephone Encounter (Signed)
Please make sure that he has not been taking losartan blood pressure medicine.  What is his blood pressure running once he has been standing for more than a few minutes?  It looks like we have an appointment scheduled for next week.  Please have them bring all prescribed medications to the appointment.

## 2023-10-18 ENCOUNTER — Inpatient Hospital Stay: Payer: Medicare PPO

## 2023-10-18 ENCOUNTER — Telehealth: Payer: Self-pay | Admitting: Hematology and Oncology

## 2023-10-18 ENCOUNTER — Telehealth: Payer: Self-pay | Admitting: Primary Care

## 2023-10-18 NOTE — Telephone Encounter (Signed)
Unable to reach patients wife. Left voicemail to return call to our office.

## 2023-10-18 NOTE — Telephone Encounter (Signed)
Called and spoke with patients wife, reviewed [name] [name] message. She verbalized understanding, they will stop hydrochlorothiazide and monitor BP closely. They will resume losartan 25 mg if BP is consistently at or above 140/90. She verbalized understanding with these instructions. They have cardiology appt on 10/22.

## 2023-10-18 NOTE — Telephone Encounter (Signed)
error 

## 2023-10-18 NOTE — Telephone Encounter (Signed)
If the hydrochlorothiazide has caused him to have to urinate frequently, and has not helped with swelling, then have him stop the hydrochlorothiazide pill.  Have them resume losartan at 25 mg if his blood pressure consistently starts running at or above 140 on top and/or 90 on bottom

## 2023-10-18 NOTE — Telephone Encounter (Signed)
He may continue the hydrochlorothiazide until he sees cardiology.  It looks like his blood pressure stabilizes after a few minutes after standing.  He will need to be cognizant of this anytime he gets up from a seated position.

## 2023-10-18 NOTE — Telephone Encounter (Signed)
See Triage note from Rena, she spoke with patients wife.

## 2023-10-18 NOTE — Telephone Encounter (Signed)
I spoke with pt wife (DPR signed) who called in and wants to know if pt should take the hydrochlorothiazide and also wants to know what BP reading is too low that pt should stop taking the hydrochlorothiazide. 7:30 AM BP sit was 148/85 P 80 and standing 96/65 P 95. No H/A,dizziness,CP or SOB. (See phone note on 10/17/23). Pt is not taking losartan. Pt has appt with card on 10/22/23 at 4:30 and card has started pt on K 10 meq daily, pt has appt with Allayne Gitelman NP on 10/25/23. At 8:25 AM BP sitting 145/77 P 78; standing BP 99/63 P 95 and after standing few mins BP standing 121/74 P 99. Pt request cb after reviewed by Allayne Gitelman NP. Sending note to Allayne Gitelman NP and [facility] pool. UC & ED precautions given and pt voiced understanding. [facility]

## 2023-10-18 NOTE — Telephone Encounter (Signed)
Called and spoke with patients wife. Advised patients wife of Isabella Stalling message.  She would like to know how low blood pressure has to get for them to need to stop hydrochlorothiazide. She stated they have not noticed any improvement in the slightest with the swelling since resuming hydrochlorothiazide. Please advise

## 2023-10-21 ENCOUNTER — Telehealth: Payer: Self-pay | Admitting: Primary Care

## 2023-10-21 DIAGNOSIS — E119 Type 2 diabetes mellitus without complications: Secondary | ICD-10-CM | POA: Diagnosis not present

## 2023-10-21 DIAGNOSIS — R2689 Other abnormalities of gait and mobility: Secondary | ICD-10-CM | POA: Diagnosis not present

## 2023-10-21 DIAGNOSIS — I69398 Other sequelae of cerebral infarction: Secondary | ICD-10-CM | POA: Diagnosis not present

## 2023-10-21 DIAGNOSIS — N289 Disorder of kidney and ureter, unspecified: Secondary | ICD-10-CM | POA: Diagnosis not present

## 2023-10-21 DIAGNOSIS — I2510 Atherosclerotic heart disease of native coronary artery without angina pectoris: Secondary | ICD-10-CM | POA: Diagnosis not present

## 2023-10-21 DIAGNOSIS — M75102 Unspecified rotator cuff tear or rupture of left shoulder, not specified as traumatic: Secondary | ICD-10-CM | POA: Diagnosis not present

## 2023-10-21 DIAGNOSIS — E7849 Other hyperlipidemia: Secondary | ICD-10-CM | POA: Diagnosis not present

## 2023-10-21 DIAGNOSIS — R292 Abnormal reflex: Secondary | ICD-10-CM | POA: Diagnosis not present

## 2023-10-21 DIAGNOSIS — I10 Essential (primary) hypertension: Secondary | ICD-10-CM | POA: Diagnosis not present

## 2023-10-21 NOTE — Telephone Encounter (Signed)
Patient wife called in and stated that her husband needs compression socks but they need to the level of compression. She stated that they have 3 levels: 15-20, 20-30 or 30-40. She needs to know this in order to get them. If 30-40 they will need a prescription for those. Thank you!

## 2023-10-22 ENCOUNTER — Inpatient Hospital Stay: Payer: Medicare PPO | Admitting: Primary Care

## 2023-10-22 ENCOUNTER — Ambulatory Visit: Payer: Medicare PPO | Attending: Internal Medicine | Admitting: Internal Medicine

## 2023-10-22 ENCOUNTER — Encounter: Payer: Self-pay | Admitting: Internal Medicine

## 2023-10-22 ENCOUNTER — Telehealth: Payer: Self-pay

## 2023-10-22 VITALS — BP 154/73 | HR 86 | Wt 208.4 lb

## 2023-10-22 DIAGNOSIS — G459 Transient cerebral ischemic attack, unspecified: Secondary | ICD-10-CM | POA: Diagnosis not present

## 2023-10-22 DIAGNOSIS — I10 Essential (primary) hypertension: Secondary | ICD-10-CM

## 2023-10-22 DIAGNOSIS — Z9889 Other specified postprocedural states: Secondary | ICD-10-CM

## 2023-10-22 DIAGNOSIS — R600 Localized edema: Secondary | ICD-10-CM | POA: Diagnosis not present

## 2023-10-22 NOTE — Patient Instructions (Addendum)
Medication Instructions:  stop HCTZ   *If you need a refill on your cardiac medications before your next appointment, please call your pharmacy*   Lab Work: Cmet, cbc, tsh, cortisol, pro bnp next Monday 10/28/23 If you have labs (blood work) drawn today and your tests are completely normal, you will receive your results only by: MyChart Message (if you have MyChart) OR A paper copy in the mail If you have any lab test that is abnormal or we need to change your treatment, we will call you to review the results.   Testing/Procedures: will move up November Echo     Follow-Up: At [facility], you and your health needs are our priority.  As part of our continuing mission to provide you with exceptional heart care, we have created designated Provider Care Teams.  These Care Teams include your primary Cardiologist (physician) and Advanced Practice Providers (APPs -  Physician Assistants and Nurse Practitioners) who all work together to provide you with the care you need, when you need it.  We recommend signing up for the patient portal called "MyChart".  Sign up information is provided on this After Visit Summary.  MyChart is used to connect with patients for Virtual Visits (Telemedicine).  Patients are able to view lab/test results, encounter notes, upcoming appointments, etc.  Non-urgent messages can be sent to your provider as well.   To learn more about what you can do with MyChart, go to [email or web address].    Your next appointment:

## 2023-10-22 NOTE — Telephone Encounter (Signed)
Spoke with the pt/ wife today re: his appt today and she report that he has not started in the K since he had a low level on 10/13/23... [facility]... [facility] and [facility] will [facility] ready for her not to pick up.. she will pick up and give him a dose asap.

## 2023-10-22 NOTE — Progress Notes (Unsigned)
Cardiology Office Note   Date:  10/22/2023   ID:  [name] [name] July 31, 1941, MRN [id number]  PCP:  Doreene Nest, NP  Cardiologist:   Dietrich Pates, MD   Pt presents for follow up from recent ER visit   History of Present Illness: [name] [name] is a 82 y.o. male with a history of CAD (remote CABG in 2005).  Myoview in 2019 showed no ischemia LVEF 64%   Also a hx of HL, CV dz, CVA (thalamic region in 2022)   Echo 2022 LVEF 60%. Pt followed by Burna Forts in clinic    Last seen in Jan 2023  Pt seen in ER on 10/13  for generalized weakness and leg swelling   Had been more fatigued over several days    BP meds changed recently and BP low at home  Pt hypertensive in ER    BNP mildly elevated    MEds were not changed   Recomm follow up in cardiology    Pt called in   FOllow up with P Nishan in Feb   Echo and labs ordered   Pt placed on DOD schedule  .    The pt is here with his daughter   He is a very difficult historian   He is being cared for at home   Cannot afford assisted living or higher care  Unsteady on feet   has fallen    Daughter says he sleeps a lot    Does have LE edema      He denies CP   Says his breathing is OK   Denies heart racing  Does report falling several times   Daughter says he is very unsteady  He denies dizziness  No syncope    Current Meds  Medication Sig   atorvastatin (LIPITOR) 10 MG tablet TAKE 1 TABLET BY MOUTH EVERY DAY FOR CHOLESTEROL   cholecalciferol (VITAMIN D3) 25 MCG (1000 UNIT) tablet Take 1,000 Units by mouth daily.   clopidogrel (PLAVIX) 75 MG tablet TAKE 1 TABLET BY MOUTH EVERY DAY   cyanocobalamin (VITAMIN B12) 500 MCG tablet Take 500 mcg by mouth daily.   glucose blood (ACCU-CHEK GUIDE) test strip Use as instructed   hydrochlorothiazide (HYDRODIURIL) 12.5 MG tablet Take 1 tablet (12.5 mg total) by mouth daily. for blood pressure.   potassium chloride (KLOR-CON) 10 MEQ tablet Take 1 tablet (10 mEq total) by mouth daily.   VALIUM 2 MG  tablet Take by mouth.     Allergies:   Niacin and Ativan [lorazepam]   Past Medical History:  Diagnosis Date   Aspiration pneumonia vs. CAP 02/28/2022   Basal cell carcinoma 11/09/2019   nod & infil-behind right ear-cx3 &exc   Basal cell carcinoma 03/21/2020   Residual BCC with peripheral margin involved - ST recommends MOHs   Carotid artery occlusion    Coronary artery disease    s/p CABG 2005; sees Dr Eden Emms yearly   Diabetes mellitus without complication (HCC)    Gynecomastia    History of COVID-19 01/08/2022   Hypercholesterolemia    Hypertension    Neurodermatitis    Overweight(278.02)    Personal history of colonic polyps 10/08/2006   tubular adenomas   Renal insufficiency    Stroke [facility])    TIA  April 2022   Transient ischemic attack 2010   "lasted ~ 5 seconds"   Vitamin D deficiency     Past Surgical History:  Procedure Laterality Date   CARDIAC  CATHETERIZATION  02/2004   "tried to stent; couldn't"   CAROTID ENDARTERECTOMY Right 08/26/2015   COLONOSCOPY     CORONARY ANGIOPLASTY     CORONARY ARTERY BYPASS GRAFT  Feb. 2005   4 vessel   ENDARTERECTOMY Right 08/26/2015   Procedure: Right Carotid ENDARTERECTOMY with Patch Angioplasty ;  Surgeon: Larina Earthly, MD;  Location: [facility];  Service: Vascular;  Laterality: Right;   ENDARTERECTOMY Left 05/18/2021   Procedure: LEFT CAROTID ARTERY ENDARTERECTOMY  with patch angioplasty;  Surgeon: Larina Earthly, MD;  Location: [facility] OR;  Service: Vascular;  Laterality: Left;   EYE SURGERY     bilateral cataract   KELOID EXCISION  04/2008   on chest scar; Dr. Stephens November   KELOID EXCISION     PILONIDAL CYST EXCISION  1989     Social History:  The patient  reports that he has never smoked. He has quit using smokeless tobacco. He reports that he does not drink alcohol and does not use drugs.   Family History:  The patient's family history includes Arthritis in his brother; Diabetes in his brother, mother, and sister; Fibromyalgia in  his sister; Healthy in his daughter; Heart attack (age of onset: 28) in his mother; Heart disease in his brother, brother, and mother; Kidney disease in his mother; Lung cancer in his paternal uncle; Lung cancer (age of onset: 22) in his father.    ROS:  Please see the history of present illness. All other systems are reviewed and  Negative to the above problem except as noted.    PHYSICAL EXAM: VS:  BP (!) 154/73   Pulse 86   Wt 208 lb 6.4 oz (94.5 kg)   SpO2 95%   BMI 28.26 kg/m   GEN: Pt is large 82 yo  in no acute distress  HEENT: normal  Neck: no JVD,  Cardiac: RRR; no murmur   1+ bilateral LE edema  Respiratory:  clear to auscultation  GI: soft, nontender Obese  No hepatomegaly   EKG:  EKG is not ordered today.   Lipid Panel    Component Value Date/Time   CHOL 130 07/12/2023 1030   TRIG 223.0 (H) 07/12/2023 1030   HDL 31.00 (L) 07/12/2023 1030   CHOLHDL 4 07/12/2023 1030   VLDL 44.6 (H) 07/12/2023 1030   LDLCALC 55 03/01/2022 0615   LDLDIRECT 63.0 07/12/2023 1030      Wt Readings from Last 3 Encounters:  10/22/23 208 lb 6.4 oz (94.5 kg)  08/13/23 209 lb (94.8 kg)  08/07/23 209 lb (94.8 kg)      ASSESSMENT AND PLAN:  1  Blood pressure  Unable to get orthostatics in office today as patient was too weak to get up on table   The  BP readings home for October,  BP drops to 80s at times with standing (need to clarify how quickly taken after standing) Most of the readings have been done in morning    ? Timing from meds, when he out of bed I have encouraged pt and family to have him wear support socks      I also recomm they  more readings of BP at other times in day   Will check labs today     OVerall difficult situation as BP is high at other times      2  LE edema  Pt with hx of venous insufficiency   Will get labs     Encouraged him to use support socks, elevate  legs     Echo ordered  3  CAD   Remote CABG in 2005    Last myoview in 2019 showed no ischemia    Pt is without CP      4  HL   Continue statin  5  CV dz Hx CVA   Pt s/p R and L CEA    6  DM   Last A1C in May 2004  was 7.2   Watch carbs   Not an an agent  Discusse with PCP       Current medicines are reviewed at length with the patient today.  The patient does not have concerns regarding medicines.  Signed, Dietrich Pates, MD  10/22/2023 4:47 PM    [facility] [address] [place], [place], [place]  [address] Phone: [phone]; Fax: [phone]

## 2023-10-23 ENCOUNTER — Other Ambulatory Visit: Payer: Self-pay

## 2023-10-23 ENCOUNTER — Telehealth: Payer: Self-pay | Admitting: Primary Care

## 2023-10-23 ENCOUNTER — Telehealth: Payer: Self-pay | Admitting: Internal Medicine

## 2023-10-23 NOTE — Telephone Encounter (Signed)
Pts wife advised that he can wear compression stockings 15-20 mmhg... he was measured at [facility] and he will only wear up to 8 hours and take them off when he sleeps at night.

## 2023-10-23 NOTE — Telephone Encounter (Signed)
  Pt's wife calling, she said, they need the compression level for the compression socks. She said, the medical supply place has three levels of compression socks

## 2023-10-23 NOTE — Telephone Encounter (Signed)
Pt wife called in and stated that Mr. Carulli Cardiologist heart doctor Dr.Ross  wants pt to stop taking potassium chloride (KLOR-CON) 10 MEQ tablet hydrochlorothiazide (HYDRODIURIL) 12.5 MG tablet pt needs to have the two meds out of his system before he can do his lab work on the 10/28 pt wife wanted to update the provider of was going on. Pt also has appt with [facility] on  10/25 at 3pm pt is cancelling appt until they can figure out what's going on with the lab work. pt would like a call back as so as possible regarding the compressing sock level.

## 2023-10-24 ENCOUNTER — Inpatient Hospital Stay: Payer: Medicare PPO

## 2023-10-24 NOTE — Telephone Encounter (Signed)
Noted. Will await and review cardiology notes.

## 2023-10-25 ENCOUNTER — Inpatient Hospital Stay: Payer: Medicare PPO | Admitting: Primary Care

## 2023-10-25 NOTE — Telephone Encounter (Signed)
Based on his level of swelling I recommend 20-30.

## 2023-10-25 NOTE — Telephone Encounter (Signed)
Spoke with pt's wife, Dwana Curd. She is aware of Kate's response. Nothing further was needed.

## 2023-10-28 ENCOUNTER — Other Ambulatory Visit: Payer: Self-pay

## 2023-10-28 ENCOUNTER — Ambulatory Visit: Payer: Medicare PPO

## 2023-10-28 ENCOUNTER — Other Ambulatory Visit: Payer: Self-pay | Admitting: *Deleted

## 2023-10-28 ENCOUNTER — Ambulatory Visit (HOSPITAL_COMMUNITY): Payer: Medicare PPO | Attending: Cardiology

## 2023-10-28 DIAGNOSIS — R600 Localized edema: Secondary | ICD-10-CM | POA: Insufficient documentation

## 2023-10-28 DIAGNOSIS — G459 Transient cerebral ischemic attack, unspecified: Secondary | ICD-10-CM | POA: Insufficient documentation

## 2023-10-28 DIAGNOSIS — Z951 Presence of aortocoronary bypass graft: Secondary | ICD-10-CM | POA: Insufficient documentation

## 2023-10-28 DIAGNOSIS — I639 Cerebral infarction, unspecified: Secondary | ICD-10-CM | POA: Diagnosis not present

## 2023-10-28 DIAGNOSIS — Z9889 Other specified postprocedural states: Secondary | ICD-10-CM

## 2023-10-28 DIAGNOSIS — R296 Repeated falls: Secondary | ICD-10-CM | POA: Diagnosis not present

## 2023-10-28 DIAGNOSIS — R2681 Unsteadiness on feet: Secondary | ICD-10-CM | POA: Diagnosis not present

## 2023-10-28 DIAGNOSIS — I10 Essential (primary) hypertension: Secondary | ICD-10-CM | POA: Diagnosis not present

## 2023-10-28 LAB — ECHOCARDIOGRAM COMPLETE
Area-P 1/2: 3.99 cm2
P 1/2 time: 401 ms
S' Lateral: 3 cm

## 2023-10-28 NOTE — Progress Notes (Signed)
Patient is here to get lab work for cardiology and needed lab work as well for Dr. Leonides Schanz. Patient was told he could get all his lab work done at one time. Placed order for lab work for Dr. Leonides Schanz so he will get results.

## 2023-10-29 ENCOUNTER — Encounter: Payer: Self-pay | Admitting: Cardiovascular Disease

## 2023-10-29 ENCOUNTER — Telehealth: Payer: Self-pay

## 2023-10-29 ENCOUNTER — Telehealth: Payer: Self-pay | Admitting: Hematology and Oncology

## 2023-10-29 LAB — COMPREHENSIVE METABOLIC PANEL
ALT: 14 [IU]/L (ref 0–44)
AST: 20 [IU]/L (ref 0–40)
Albumin: 3.6 g/dL — ABNORMAL LOW (ref 3.8–4.8)
Alkaline Phosphatase: 121 [IU]/L (ref 44–121)
BUN/Creatinine Ratio: 13 (ref 10–24)
BUN: 11 mg/dL (ref 8–27)
Bilirubin Total: 0.5 mg/dL (ref 0.0–1.2)
CO2: 28 mmol/L (ref 20–29)
Calcium: 8.3 mg/dL — ABNORMAL LOW (ref 8.6–10.2)
Chloride: 101 mmol/L (ref 96–106)
Creatinine, Ser: 0.87 mg/dL (ref 0.76–1.27)
Globulin, Total: 2.3 g/dL (ref 1.5–4.5)
Glucose: 148 mg/dL — ABNORMAL HIGH (ref 70–99)
Potassium: 3.6 mmol/L (ref 3.5–5.2)
Sodium: 143 mmol/L (ref 134–144)
Total Protein: 5.9 g/dL — ABNORMAL LOW (ref 6.0–8.5)
eGFR: 88 mL/min/{1.73_m2} (ref >59)

## 2023-10-29 LAB — PRO B NATRIURETIC PEPTIDE: NT-Pro BNP: 711 pg/mL — ABNORMAL HIGH (ref 0–486)

## 2023-10-29 LAB — CBC
Hematocrit: 46 % (ref 37.5–51.0)
Hemoglobin: 15.2 g/dL (ref 13.0–17.7)
MCH: 35.8 pg — ABNORMAL HIGH (ref 26.6–33.0)
MCHC: 33 g/dL (ref 31.5–35.7)
MCV: 109 fL — ABNORMAL HIGH (ref 79–97)
Platelets: 294 10*3/uL (ref 150–450)
RBC: 4.24 x10E6/uL (ref 4.14–5.80)
RDW: 13.1 % (ref 11.6–15.4)
WBC: 7.8 10*3/uL (ref 3.4–10.8)

## 2023-10-29 LAB — FERRITIN: Ferritin: 580 ng/mL — ABNORMAL HIGH (ref 30–400)

## 2023-10-29 LAB — CBC WITH DIFFERENTIAL/PLATELET
Basophils Absolute: 0.1 10*3/uL (ref 0.0–0.2)
Basos: 1 %
EOS (ABSOLUTE): 0.2 10*3/uL (ref 0.0–0.4)
Eos: 2 %
Hematocrit: 44.7 % (ref 37.5–51.0)
Hemoglobin: 14.8 g/dL (ref 13.0–17.7)
Immature Grans (Abs): 0.1 10*3/uL (ref 0.0–0.1)
Immature Granulocytes: 1 %
Lymphocytes Absolute: 1.6 10*3/uL (ref 0.7–3.1)
Lymphs: 19 %
MCH: 35 pg — ABNORMAL HIGH (ref 26.6–33.0)
MCHC: 33.1 g/dL (ref 31.5–35.7)
MCV: 106 fL — ABNORMAL HIGH (ref 79–97)
Monocytes Absolute: 0.6 10*3/uL (ref 0.1–0.9)
Monocytes: 8 %
Neutrophils Absolute: 5.9 10*3/uL (ref 1.4–7.0)
Neutrophils: 69 %
Platelets: 296 10*3/uL (ref 150–450)
RBC: 4.23 x10E6/uL (ref 4.14–5.80)
RDW: 12.8 % (ref 11.6–15.4)
WBC: 8.3 10*3/uL (ref 3.4–10.8)

## 2023-10-29 LAB — CORTISOL: Cortisol: 11.5 ug/dL (ref 6.2–19.4)

## 2023-10-29 LAB — TSH: TSH: 2.97 u[IU]/mL (ref 0.450–4.500)

## 2023-10-29 NOTE — Telephone Encounter (Signed)
Pt's wife LVM stating that pt received labs from another MD office and wanted to see if we could cancel alb appt for 10/29.   This RN called pt's wife back. This RN informed pt's wife that we would be able to cancel lab appt for tomorrow. Pt's wife then states she is unsure if they would be able to make it to the phlebotomy appt tomorrow and wanted to know if there was availability on 11/1 or 11/8. This RN stated that she would reach out to scheduling to see if there was something available on 11/1. Pt's wife then states she is unsure if they would be able to make that appt.   This RN informed pt's wife to please check and see if 11/1 will work for her and give [place] a call back as we may be able to get pt scheduled for that day.

## 2023-10-30 ENCOUNTER — Telehealth: Payer: Self-pay | Admitting: Neurology

## 2023-10-30 ENCOUNTER — Telehealth: Payer: Self-pay | Admitting: Hematology and Oncology

## 2023-10-30 NOTE — Telephone Encounter (Addendum)
Pts wife advised... she says that the pt is still very weak,, but not as bad as when he was seen... he is moving very slow... BP seems to not be dropping with position changes... he has not been feeling orthostatic... he is not taking any BP meds this week.   She is asking when to have his K rechecked last K was 3.6...[name].. and when to restart BP meds.   Last few BP's: off of his meds:   Today 146/80 P 72 sitting and standing 152/78 P 95  10/29 154/84 P 68 sitting and standing 143/78 P 87  10/28 165/82 P 88 sitting ans standing 117/70 P92  Will send to Dr Eden Emms Dr Tenny Craw for review.

## 2023-10-30 NOTE — Telephone Encounter (Signed)
Echo looks good  Pumping function of LV and RV is normal Valve function is good  How is he doing?

## 2023-10-30 NOTE — Telephone Encounter (Signed)
I would avoid extreme control of blood pressure, allow some hypertension to avoid hypotension Defer to P Nishan timing of follow up labs

## 2023-10-30 NOTE — Telephone Encounter (Signed)
[name] called in wanting to see the status of getting the orders that were faxed over for this patient? There are 5 September orders and 3 for October they are waiting on.

## 2023-10-30 NOTE — Telephone Encounter (Signed)
Paperwork re faxed and scanned to chart.

## 2023-10-31 ENCOUNTER — Inpatient Hospital Stay: Payer: Medicare PPO

## 2023-10-31 ENCOUNTER — Telehealth: Payer: Self-pay | Admitting: Cardiovascular Disease

## 2023-10-31 ENCOUNTER — Other Ambulatory Visit: Payer: Self-pay | Admitting: Hematology and Oncology

## 2023-10-31 DIAGNOSIS — N289 Disorder of kidney and ureter, unspecified: Secondary | ICD-10-CM | POA: Diagnosis not present

## 2023-10-31 DIAGNOSIS — R2689 Other abnormalities of gait and mobility: Secondary | ICD-10-CM | POA: Diagnosis not present

## 2023-10-31 DIAGNOSIS — I69398 Other sequelae of cerebral infarction: Secondary | ICD-10-CM | POA: Diagnosis not present

## 2023-10-31 DIAGNOSIS — I10 Essential (primary) hypertension: Secondary | ICD-10-CM | POA: Diagnosis not present

## 2023-10-31 DIAGNOSIS — R292 Abnormal reflex: Secondary | ICD-10-CM | POA: Diagnosis not present

## 2023-10-31 DIAGNOSIS — M75102 Unspecified rotator cuff tear or rupture of left shoulder, not specified as traumatic: Secondary | ICD-10-CM | POA: Diagnosis not present

## 2023-10-31 DIAGNOSIS — E7849 Other hyperlipidemia: Secondary | ICD-10-CM | POA: Diagnosis not present

## 2023-10-31 DIAGNOSIS — I2510 Atherosclerotic heart disease of native coronary artery without angina pectoris: Secondary | ICD-10-CM | POA: Diagnosis not present

## 2023-10-31 DIAGNOSIS — E119 Type 2 diabetes mellitus without complications: Secondary | ICD-10-CM | POA: Diagnosis not present

## 2023-10-31 NOTE — Telephone Encounter (Signed)
Spoke with wife per DPR and she would like to know what will be next steps for patient. She would like is he supposed to start his potassium now that he had his echo? She states she did receive message on mychart but she feels its not clear. . Will forward to provider.

## 2023-10-31 NOTE — Telephone Encounter (Signed)
Pt c/o medication issue:  1. Name of Medication:   Blood pressure medication  2. How are you currently taking this medication (dosage and times per day)?   Currently not taking  3. Are you having a reaction (difficulty breathing--STAT)?     4. What is your medication issue?    Wife stated the she and the patient are confused regarding if patient needs to re-start his BP medicine which he has been off for quite a while now.  Wife wants to know that since patient has had his lab tests if he will need to start on potassium.  Wife wants a call back to discuss next steps.

## 2023-11-01 NOTE — Telephone Encounter (Signed)
Patient's K was normal 3.6 on 10/28    He is due to have labs on 11/5  Make sure BMET is part of this panel I would recomm he keep on same meds

## 2023-11-04 ENCOUNTER — Inpatient Hospital Stay: Payer: Medicare PPO

## 2023-11-04 ENCOUNTER — Inpatient Hospital Stay: Payer: Medicare PPO | Attending: Hematology and Oncology

## 2023-11-05 ENCOUNTER — Inpatient Hospital Stay: Payer: Medicare PPO

## 2023-11-05 ENCOUNTER — Ambulatory Visit: Payer: Medicare PPO | Admitting: Primary Care

## 2023-11-05 ENCOUNTER — Inpatient Hospital Stay: Payer: Medicare PPO | Attending: Hematology and Oncology

## 2023-11-05 DIAGNOSIS — R7989 Other specified abnormal findings of blood chemistry: Secondary | ICD-10-CM | POA: Diagnosis not present

## 2023-11-05 LAB — CBC WITH DIFFERENTIAL (CANCER CENTER ONLY)
Abs Immature Granulocytes: 0.07 10*3/uL (ref 0.00–0.07)
Basophils Absolute: 0 10*3/uL (ref 0.0–0.1)
Basophils Relative: 1 %
Eosinophils Absolute: 0.1 10*3/uL (ref 0.0–0.5)
Eosinophils Relative: 2 %
HCT: 43.5 % (ref 39.0–52.0)
Hemoglobin: 15.3 g/dL (ref 13.0–17.0)
Immature Granulocytes: 1 %
Lymphocytes Relative: 21 %
Lymphs Abs: 1.6 10*3/uL (ref 0.7–4.0)
MCH: 35.7 pg — ABNORMAL HIGH (ref 26.0–34.0)
MCHC: 35.2 g/dL (ref 30.0–36.0)
MCV: 101.6 fL — ABNORMAL HIGH (ref 80.0–100.0)
Monocytes Absolute: 0.7 10*3/uL (ref 0.1–1.0)
Monocytes Relative: 10 %
Neutro Abs: 5 10*3/uL (ref 1.7–7.7)
Neutrophils Relative %: 65 %
Platelet Count: 257 10*3/uL (ref 150–400)
RBC: 4.28 MIL/uL (ref 4.22–5.81)
RDW: 13.2 % (ref 11.5–15.5)
WBC Count: 7.6 10*3/uL (ref 4.0–10.5)
nRBC: 0 % (ref 0.0–0.2)

## 2023-11-05 LAB — CMP (CANCER CENTER ONLY)
ALT: 13 U/L (ref 0–44)
AST: 19 U/L (ref 15–41)
Albumin: 3.6 g/dL (ref 3.5–5.0)
Alkaline Phosphatase: 88 U/L (ref 38–126)
Anion gap: 7 (ref 5–15)
BUN: 13 mg/dL (ref 8–23)
CO2: 28 mmol/L (ref 22–32)
Calcium: 8.7 mg/dL — ABNORMAL LOW (ref 8.9–10.3)
Chloride: 105 mmol/L (ref 98–111)
Creatinine: 0.9 mg/dL (ref 0.61–1.24)
GFR, Estimated: >60 mL/min (ref >60)
Glucose, Bld: 134 mg/dL — ABNORMAL HIGH (ref 70–99)
Potassium: 3.6 mmol/L (ref 3.5–5.1)
Sodium: 140 mmol/L (ref 135–145)
Total Bilirubin: 0.6 mg/dL (ref <1.2)
Total Protein: 6.7 g/dL (ref 6.5–8.1)

## 2023-11-05 NOTE — Patient Instructions (Signed)

## 2023-11-05 NOTE — Progress Notes (Signed)
[name] [name] presents today for phlebotomy per MD orders. Phlebotomy procedure started at 1538 and ended at 1548. 524 grams removed via 16G LAC Patient observed for 30 minutes after procedure without any incident. Patient tolerated procedure well. IV needle removed intact.

## 2023-11-06 LAB — FERRITIN: Ferritin: 361 ng/mL — ABNORMAL HIGH (ref 24–336)

## 2023-11-08 ENCOUNTER — Telehealth: Payer: Self-pay

## 2023-11-08 ENCOUNTER — Other Ambulatory Visit: Payer: Self-pay

## 2023-11-08 NOTE — Telephone Encounter (Signed)
Patient aware to keep on same medications, no changes.

## 2023-11-08 NOTE — Telephone Encounter (Signed)
Called patient's wife back about message. Informed her that Dr. Eden Emms recommends patient to stay off hydrochlorothiazide. Patient's wife verbalized understanding.

## 2023-11-08 NOTE — Telephone Encounter (Signed)
Pts wife called me directly and asked if the pt was to start back on his HCTZ at some point... he recently had labs with DR Leonides Schanz and his  K was 3.6.Marland KitchenMarland Kitchen he is not on the K tabs.   His BP has been sitting 157/80 and immediately standing 114/75, standing 153/77 and standing 103/73.  Now that he has been off of the HCTZ, he is not having any dizziness even though his BP still drops when standing.   He only has mild minimal pedal edema and no SOB.   I advised her that I can ask Dr Eden Emms about the HCTZ but for now to stay off of it.Marland Kitchenkeep a log of his readings but maybe not every day as long as he is feeling well.   When they do check his BP... to possibly check the standing BP 1-2 min after standing and not immediately.   To let us know if anything worsens... and we moved up his Feb appt with Dr Eden Emms to 11/19/23... late in the day since his daughter comes from [place] to bring him. She says she would feel relived if he had an OV just to be sure that he is doing okay prior to the holidays.

## 2023-11-12 ENCOUNTER — Ambulatory Visit: Payer: Medicare PPO | Admitting: Primary Care

## 2023-11-12 ENCOUNTER — Telehealth: Payer: Self-pay | Admitting: Primary Care

## 2023-11-12 ENCOUNTER — Other Ambulatory Visit (HOSPITAL_COMMUNITY): Payer: Medicare PPO

## 2023-11-12 NOTE — Telephone Encounter (Signed)
Noted and appreciate the update! 

## 2023-11-12 NOTE — Progress Notes (Signed)
Patient ID: [name] [name], male   DOB: [birth year], 82 y.o.   MRN: [id number]     Cardiology Office Note   Date:  11/19/2023   ID:  [name] [name], DOB 1941-04-09, MRN [id number]  PCP:  Doreene Nest, NP  Cardiologist:  Dr. Eden Emms    No chief complaint on file.      History of Present Illness: [name] [name] is a 82 y.o. male  known coronary disease with previous coronary bypass surgery in 2005. he has normal LV function.  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 70-80% proximal ICA but no laterality given Last duplex 10/17/20 had patent right CEA and 40-59% Left ICA stenosis Infarct felt to be from small vessel disease with associated lacunar infarcts but recommended f/u with [facility] for left ICA   Seen by DR Early and had left CEA 05/18/21   He has more LE edema This is not CHF He has venous insufficiency and varicosities Coreg was d/c but has not helped a lot.    COVID positive 01/08/22 has had 4 vaccines Rx with Molnupiravir  He also has diagnosis of hemachromatosis. Ferritin mildly elevated 11/05/23 at 361  Seen in ED 10/13/23 and then by  Dr Tenny Craw 10/22/4 with some postural symptoms and LE edema. Hydrochlorothiazide held and indicated to tolerate higher systolic pressures at rest/sitting. K 3.6 Cortisol and TSH normal Hct 46 Dizziness improved off hydrochlorothiazide   Updated TTE 10/28/23 EF 50-55% grade one diastolic Normal RV no significant valve dx  His daughter from [place] brought him today. He is not on any meds for  DM and not taking any K/hydrochlorothiazide He has mild LE edema from his venous dx. Getting phlebotomy for his Hemachromatosis every 2 weeks    Past Medical History:  Diagnosis Date   Aspiration pneumonia vs. CAP 02/28/2022   Basal cell carcinoma 11/09/2019   nod & infil-behind right ear-cx3 &exc   Basal cell carcinoma 03/21/2020   Residual BCC with peripheral margin involved - ST recommends MOHs   Carotid artery occlusion    Coronary artery disease    s/p CABG 2005; sees Dr Eden Emms yearly   Diabetes mellitus without complication (HCC)    Gynecomastia    History of COVID-19 01/08/2022   Hypercholesterolemia    Hypertension    Neurodermatitis    Overweight(278.02)    Personal history of colonic polyps 10/08/2006   tubular adenomas   Renal insufficiency    Stroke [facility])    TIA  April 2022   Transient ischemic attack 2010   "lasted ~ 5 seconds"   Vitamin D deficiency     Past Surgical History:  Procedure Laterality Date   CARDIAC CATHETERIZATION  02/2004   "tried to stent; couldn't"   CAROTID ENDARTERECTOMY Right 08/26/2015   COLONOSCOPY     CORONARY ANGIOPLASTY     CORONARY ARTERY BYPASS GRAFT  Feb. 2005   4 vessel   ENDARTERECTOMY Right 08/26/2015   Procedure: Right Carotid ENDARTERECTOMY with Patch  Angioplasty ;  Surgeon: Larina Earthly, MD;  Location: [facility] OR;  Service: Vascular;  Laterality: Right;   ENDARTERECTOMY Left 05/18/2021   Procedure: LEFT CAROTID ARTERY ENDARTERECTOMY  with patch angioplasty;  Surgeon: Larina Earthly, MD;  Location: [facility] OR;  Service: Vascular;  Laterality: Left;   EYE SURGERY     bilateral cataract   KELOID EXCISION  04/2008   on chest scar; Dr. Stephens November   KELOID EXCISION     PILONIDAL CYST EXCISION  1989     Current Outpatient Medications  Medication Sig Dispense Refill   atorvastatin (LIPITOR) 10 MG tablet TAKE 1 TABLET BY MOUTH EVERY DAY FOR CHOLESTEROL 90 tablet 2   cholecalciferol (VITAMIN D3) 25 MCG (1000 UNIT) tablet Take 1,000 Units by  mouth daily.     clopidogrel (PLAVIX) 75 MG tablet TAKE 1 TABLET BY MOUTH EVERY DAY 90 tablet 3   cyanocobalamin (VITAMIN B12) 500 MCG tablet Take 500 mcg by mouth daily.     glucose blood (ACCU-CHEK GUIDE) test strip Use as instructed 100 strip 3   No current facility-administered medications for this visit.    Allergies:   Niacin and Ativan [lorazepam]    Social History:  The patient  reports that he has never smoked. He has quit using smokeless tobacco. He reports that he does not drink alcohol and does not use drugs.   Family History:  The patient's family history includes Arthritis in his brother; Diabetes in his brother, mother, and sister; Fibromyalgia in his sister; Healthy in his daughter; Heart attack (age of onset: 7) in his mother; Heart disease in his brother, brother, and mother; Kidney disease in his mother; Lung cancer in his paternal uncle; Lung cancer (age of onset: 65) in his father.    ROS:  General:no colds or fevers, no weight changes Skin:no rashes or ulcers HEENT:no blurred vision, no congestion CV:see HPI PUL:see HPI GI:no diarrhea constipation or melena, no indigestion GU:no hematuria, no dysuria MS:no joint pain, no claudication Neuro:no syncope, no lightheadedness Endo:no diabetes, no thyroid disease  Wt Readings from Last 3 Encounters:  11/19/23 204 lb (92.5 kg)  10/22/23 208 lb 6.4 oz (94.5 kg)  08/13/23 209 lb (94.8 kg)     PHYSICAL EXAM: VS:  BP 110/62   Pulse 83   Ht 6' (1.829 m)   Wt 204 lb (92.5 kg)   SpO2 93%   BMI 27.67 kg/m  , BMI Body mass index is 27.67 kg/m. Affect appropriate Healthy:  appears stated age HEENT: normal Neck supple with no adenopathy Post bilateral CEA  JVP normal post bilateral CEA  Lungs clear with no wheezing and good diaphragmatic motion Heart:  S1/S2 no murmur, no rub, gallop or click PMI normal Abdomen: benighn, BS positve, no tenderness, no AAA no bruit.  No HSM or HJR Distal pulses intact with no  bruits Plus one bilateral edema with varicosities  Neuro mild dysarthria  Skin warm and dry No muscular weakness    EKG:   10/16/19 SR rate 70 nonspecific ST changes   Recent Labs: 10/13/2023: B Natriuretic Peptide 233.6 10/28/2023: NT-Pro BNP 711; TSH 2.970 11/05/2023: ALT 13; BUN 13; Creatinine 0.90; Hemoglobin 15.3; Platelet Count 257; Potassium 3.6; Sodium 140    Lipid Panel    Component Value Date/Time   CHOL 130 07/12/2023 1030   TRIG 223.0 (H) 07/12/2023 1030   HDL 31.00 (L) 07/12/2023 1030   CHOLHDL 4 07/12/2023 1030   VLDL 44.6 (H) 07/12/2023 1030   LDLCALC  55 03/01/2022 0615   LDLDIRECT 63.0 07/12/2023 1030       Other studies Reviewed: Additional studies/ records that were reviewed today include: previous notes, nuc study.and carotid    ASSESSMENT AND PLAN:  1.  CAD s/p CABG 2005 Had no symptoms before CABG  Non ischemic myovue 09/09/18 continue medical Rx    2. Carotid:  Post right CEA Thalamic stroke April 2022 CTA suggested left ICA stenosis now post Left CEA by  Dr Arbie Cookey 05/18/21 f/u duplex   3.  HTN  see below regarding postural symptoms   4. Hyperlipidemia stable on statin. Last LDL 56 on 20 mg lipitor   5. DM:  Discussed low carb diet.  Target hemoglobin A1c is 6.5 or less.  Continue current medications. He is getting Lab work in June with primary   6. Edema:  from LE venous disease Reflux documented by duplex 08/02/21 He has preferred hydrochlorothiazide to lasix in past would use PRN given low K, fatigue and postural symptoms   7. COVID:  01/08/22 Rx with antiviral no CXR done has had 4 vaccines   8. Postural:  ? Related to age, hemachromatosis, and DM.  Improved off diuretic  9. Hemachromatosis:  US liver with abnormal texture ? 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

## 2023-11-12 NOTE — Telephone Encounter (Signed)
Pt's wife, Dwana Curd, called to let [facility] know the reason she hasn't been in touch with [facility] regarding pt was due to his recent issues with his BP. Dwana Curd states when the pt stands up, his BP drops. Dwana Curd states the pt however, doesn't experience any dizziness. Dwana Curd stated the pt's cardiologist wanted to do a echocardiogram on pt. Dwana Curd states the results presented the pt's blood flow to be good. Dwana Curd stated the cardiologist took the pt off his BP meds, losartan & hydrochlorothiazide. Dwana Curd mentioned the pt was also taken off his potassium meds as well. [name] states the pt's potassium is slightly elevated, as before, it was low. Dwana Curd states the pt is now only on vitamin D & b12 meds, atorvastatin (LIPITOR) 10 MG tablet & clopidogrel (PLAVIX) 75 MG tablet, as of now. Dwana Curd states the pt is still having to do phlebotomy inj for his iron, every 2 weeks. Dwana Curd states the pt's iron is now a 386, when before it was close to 1000. Call back # [phone]

## 2023-11-13 ENCOUNTER — Encounter: Payer: Self-pay | Admitting: Podiatry

## 2023-11-13 ENCOUNTER — Ambulatory Visit (INDEPENDENT_AMBULATORY_CARE_PROVIDER_SITE_OTHER): Payer: Medicare PPO | Admitting: Podiatry

## 2023-11-13 DIAGNOSIS — M79675 Pain in left toe(s): Secondary | ICD-10-CM

## 2023-11-13 DIAGNOSIS — E1165 Type 2 diabetes mellitus with hyperglycemia: Secondary | ICD-10-CM

## 2023-11-13 DIAGNOSIS — M79674 Pain in right toe(s): Secondary | ICD-10-CM

## 2023-11-13 DIAGNOSIS — B351 Tinea unguium: Secondary | ICD-10-CM | POA: Diagnosis not present

## 2023-11-13 NOTE — Progress Notes (Signed)
This patient presents  to my office for at risk foot care.  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 Patient has history of CVA. This patient  takes plavix.This patient is unable to cut nails himself since the patient cannot reach his nails.These nails are painful walking and wearing shoes.  This patient presents for at risk foot care today.  General Appearance  Alert, conversant and in no acute stress.  Vascular  Dorsalis pedis and posterior tibial  pulses are weakly  palpable  bilaterally.  Capillary return is within normal limits  bilaterally. Temperature is within normal limits  bilaterally. Swelling persists in feet.   Neurologic  Senn-Weinstein monofilament wire test within normal limits  bilaterally. Muscle power within normal limits bilaterally.  Nails Thick disfigured discolored nails with subungual debris  from hallux to fifth toes bilaterally. No evidence of bacterial infection or drainage bilaterally.  Orthopedic  No limitations of motion  feet .  No crepitus or effusions noted.  No bony pathology or digital deformities noted.  Skin  normotropic skin with no porokeratosis noted bilaterally.  No signs of infections or ulcers noted.     Onychomycosis  Pain in right toes  Pain in left toes  Consent was obtained for treatment procedures.  Mechanical debridement of nails 1-5  bilaterally performed with a nail nipper.  Filed with dremel without incident.    Return office visit    3 months                  Told patient to return for periodic foot care and evaluation due to potential at risk complications.   Unique Searfoss DPM   

## 2023-11-14 ENCOUNTER — Ambulatory Visit: Payer: Medicare PPO | Admitting: Podiatry

## 2023-11-15 ENCOUNTER — Inpatient Hospital Stay: Payer: Medicare PPO

## 2023-11-15 ENCOUNTER — Inpatient Hospital Stay: Payer: Medicare PPO | Admitting: Hematology and Oncology

## 2023-11-19 ENCOUNTER — Ambulatory Visit: Payer: Medicare PPO | Attending: Cardiovascular Disease | Admitting: Cardiovascular Disease

## 2023-11-19 VITALS — BP 110/62 | HR 83 | Ht 72.0 in | Wt 204.0 lb

## 2023-11-19 DIAGNOSIS — Z951 Presence of aortocoronary bypass graft: Secondary | ICD-10-CM | POA: Diagnosis not present

## 2023-11-19 DIAGNOSIS — I10 Essential (primary) hypertension: Secondary | ICD-10-CM | POA: Diagnosis not present

## 2023-11-19 DIAGNOSIS — G459 Transient cerebral ischemic attack, unspecified: Secondary | ICD-10-CM | POA: Diagnosis not present

## 2023-11-19 DIAGNOSIS — R600 Localized edema: Secondary | ICD-10-CM | POA: Diagnosis not present

## 2023-11-19 DIAGNOSIS — Z9889 Other specified postprocedural states: Secondary | ICD-10-CM | POA: Diagnosis not present

## 2023-11-19 NOTE — Patient Instructions (Signed)
Medication Instructions:  Your physician recommends that you continue on your current medications as directed. Please refer to the Current Medication list given to you today.  *If you need a refill on your cardiac medications before your next appointment, please call your pharmacy*  Lab Work: If you have labs (blood work) drawn today and your tests are completely normal, you will receive your results only by: MyChart Message (if you have MyChart) OR A paper copy in the mail If you have any lab test that is abnormal or we need to change your treatment, we will call you to review the results.  Follow-Up: At [facility], you and your health needs are our priority.  As part of our continuing mission to provide you with exceptional heart care, we have created designated Provider Care Teams.  These Care Teams include your primary Cardiologist (physician) and Advanced Practice Providers (APPs -  Physician Assistants and Nurse Practitioners) who all work together to provide you with the care you need, when you need it.  We recommend signing up for the patient portal called "MyChart".  Sign up information is provided on this After Visit Summary.  MyChart is used to connect with patients for Virtual Visits (Telemedicine).  Patients are able to view lab/test results, encounter notes, upcoming appointments, etc.  Non-urgent messages can be sent to your provider as well.   To learn more about what you can do with MyChart, go to [email or web address].    Your next appointment:   6 month(s)  Provider:   Peter Nishan, MD     

## 2023-11-20 ENCOUNTER — Encounter: Payer: Self-pay | Admitting: Primary Care

## 2023-11-20 ENCOUNTER — Other Ambulatory Visit: Payer: Self-pay | Admitting: Primary Care

## 2023-11-20 DIAGNOSIS — I10 Essential (primary) hypertension: Secondary | ICD-10-CM

## 2023-11-20 NOTE — Telephone Encounter (Signed)
error 

## 2023-11-22 ENCOUNTER — Inpatient Hospital Stay: Payer: Medicare PPO

## 2023-11-22 ENCOUNTER — Inpatient Hospital Stay: Payer: Medicare PPO | Admitting: Hematology and Oncology

## 2023-11-22 DIAGNOSIS — R7989 Other specified abnormal findings of blood chemistry: Secondary | ICD-10-CM

## 2023-11-22 LAB — CBC WITH DIFFERENTIAL (CANCER CENTER ONLY)
Abs Immature Granulocytes: 0.04 10*3/uL (ref 0.00–0.07)
Basophils Absolute: 0 10*3/uL (ref 0.0–0.1)
Basophils Relative: 0 %
Eosinophils Absolute: 0.2 10*3/uL (ref 0.0–0.5)
Eosinophils Relative: 2 %
HCT: 45.8 % (ref 39.0–52.0)
Hemoglobin: 15.5 g/dL (ref 13.0–17.0)
Immature Granulocytes: 1 %
Lymphocytes Relative: 18 %
Lymphs Abs: 1.4 10*3/uL (ref 0.7–4.0)
MCH: 35.4 pg — ABNORMAL HIGH (ref 26.0–34.0)
MCHC: 33.8 g/dL (ref 30.0–36.0)
MCV: 104.6 fL — ABNORMAL HIGH (ref 80.0–100.0)
Monocytes Absolute: 0.6 10*3/uL (ref 0.1–1.0)
Monocytes Relative: 9 %
Neutro Abs: 5.1 10*3/uL (ref 1.7–7.7)
Neutrophils Relative %: 70 %
Platelet Count: 218 10*3/uL (ref 150–400)
RBC: 4.38 MIL/uL (ref 4.22–5.81)
RDW: 13.2 % (ref 11.5–15.5)
WBC Count: 7.3 10*3/uL (ref 4.0–10.5)
nRBC: 0 % (ref 0.0–0.2)

## 2023-11-22 LAB — FERRITIN: Ferritin: 353 ng/mL — ABNORMAL HIGH (ref 24–336)

## 2023-11-22 LAB — CMP (CANCER CENTER ONLY)
ALT: 13 U/L (ref 0–44)
AST: 17 U/L (ref 15–41)
Albumin: 3.7 g/dL (ref 3.5–5.0)
Alkaline Phosphatase: 105 U/L (ref 38–126)
Anion gap: 7 (ref 5–15)
BUN: 11 mg/dL (ref 8–23)
CO2: 29 mmol/L (ref 22–32)
Calcium: 8.8 mg/dL — ABNORMAL LOW (ref 8.9–10.3)
Chloride: 102 mmol/L (ref 98–111)
Creatinine: 0.86 mg/dL (ref 0.61–1.24)
GFR, Estimated: >60 mL/min (ref >60)
Glucose, Bld: 205 mg/dL — ABNORMAL HIGH (ref 70–99)
Potassium: 3.7 mmol/L (ref 3.5–5.1)
Sodium: 138 mmol/L (ref 135–145)
Total Bilirubin: 0.8 mg/dL (ref <1.2)
Total Protein: 7 g/dL (ref 6.5–8.1)

## 2023-11-22 NOTE — Progress Notes (Unsigned)
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 Leonides Schanz. 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  08/26/2023: start of q 2 week phlebotomies.   Interval History:  CELESTE RUESGA 82 y.o. male with medical history significant for hereditary hemochromatosis. who presents for a follow up visit. The patient's last visit was on 08/07/2023 at which time he established care. In the interim since the last visit the patient has continued with every 2-week phlebotomies.  On exam today Mr. Schwieterman reports that he has been tolerating his phlebotomies well every 2 weeks.  He does have some occasional weakness for a few days after the phlebotomy, but he recovers well.  He notes he is not having any bleeding from any other sources such as nosebleeds, gum bleeding, or dark stools.  He reports he is not having any lightheadedness, dizziness, or shortness of breath.  He reports that he has not noticed much of a difference in his health since we started the phlebotomies.  His appetite remains strong and has not had any infectious symptoms such as runny nose, sore throat, or cough.  He denies any fevers, chills, sweats, nausea vomiting or diarrhea.  Overall he is willing and able to proceed with phlebotomies at this time.  MEDICAL HISTORY:  Past Medical History:  Diagnosis Date   Aspiration pneumonia vs. CAP 02/28/2022   Basal cell carcinoma 11/09/2019   nod & infil-behind right ear-cx3 &exc   Basal cell carcinoma  03/21/2020   Residual BCC with peripheral margin involved - ST recommends MOHs   Carotid artery occlusion    Coronary artery disease    s/p CABG 2005; sees Dr Eden Emms yearly   Diabetes mellitus without complication (HCC)    Gynecomastia    History of COVID-19 01/08/2022   Hypercholesterolemia    Hypertension    Neurodermatitis    Overweight(278.02)    Personal history of colonic polyps 10/08/2006   tubular adenomas   Renal insufficiency    Stroke [facility])    TIA  April 2022   Transient ischemic attack 2010   "lasted ~ 5 seconds"   Vitamin D deficiency     SURGICAL HISTORY: Past Surgical History:  Procedure Laterality Date   CARDIAC CATHETERIZATION  02/2004   "tried to stent; couldn't"   CAROTID ENDARTERECTOMY Right 08/26/2015   COLONOSCOPY     CORONARY ANGIOPLASTY     CORONARY ARTERY BYPASS GRAFT  Feb. 2005   4 vessel   ENDARTERECTOMY Right 08/26/2015   Procedure: Right Carotid ENDARTERECTOMY with Patch Angioplasty ;  Surgeon: Larina Earthly, MD;  Location: [facility] OR;  Service: Vascular;  Laterality: Right;   ENDARTERECTOMY Left 05/18/2021   Procedure: LEFT CAROTID ARTERY ENDARTERECTOMY  with patch angioplasty;  Surgeon: Larina Earthly, MD;  Location: [facility] OR;  Service: Vascular;  Laterality: Left;   EYE SURGERY     bilateral cataract   KELOID EXCISION  04/2008   on chest scar; Dr. Stephens November   KELOID EXCISION     PILONIDAL CYST EXCISION  1989    SOCIAL HISTORY: Social History  Socioeconomic History   Marital status: Married    Spouse name: [name]   Number of children: 1   Years of education: Not on file   Highest education level: Some college, no degree  Occupational History   Occupation: retired  Tobacco Use   Smoking status: Never   Smokeless tobacco: Former  Building services engineer status: Never Used  Substance and Sexual Activity   Alcohol use: No    Alcohol/week: 0.0 standard drinks of alcohol   Drug use: No   Sexual activity: Yes  Other Topics Concern   Not on file   Social History Narrative   Retired.   Once worked for the [employer].   Married.   Enjoys reading, spending time with family.    Left handed   Drinks caffeine   One story home   Social Determinants of Health   Financial Resource Strain: Low Risk  (06/24/2023)   Overall Financial Resource Strain (CARDIA)    Difficulty of Paying Living Expenses: Not hard at all  Food Insecurity: No Food Insecurity (06/24/2023)   Hunger Vital Sign    Worried About Running Out of Food in the Last Year: Never true    Ran Out of Food in the Last Year: Never true  Transportation Needs: No Transportation Needs (06/24/2023)   PRAPARE - Administrator, Civil Service (Medical): No    Lack of Transportation (Non-Medical): No  Physical Activity: Inactive (06/24/2023)   Exercise Vital Sign    Days of Exercise per Week: 0 days    Minutes of Exercise per Session: 0 min  Stress: No Stress Concern Present (06/24/2023)   [employer] of Occupational Health - Occupational Stress Questionnaire    Feeling of Stress : Not at all  Social Connections: Moderately Integrated (06/24/2023)   Social Connection and Isolation Panel [NHANES]    Frequency of Communication with Friends and Family: More than three times a week    Frequency of Social Gatherings with Friends and Family: More than three times a week    Attends Religious Services: More than 4 times per year    Active Member of [employer] or Organizations: No    Attends Banker Meetings: Never    Marital Status: Married  Catering manager Violence: Not At Risk (06/24/2023)   Humiliation, Afraid, Rape, and Kick questionnaire    Fear of Current or Ex-Partner: No    Emotionally Abused: No    Physically Abused: No    Sexually Abused: No    FAMILY HISTORY: Family History  Problem Relation Age of Onset   Heart disease Mother        Before age 80   Diabetes Mother    Kidney disease Mother    Heart attack Mother 99   Lung cancer Father 39   Diabetes  Brother    Heart disease Brother    Heart disease Brother    Arthritis Brother    Diabetes Sister    Fibromyalgia Sister    Lung cancer Paternal Uncle        questionable as to if it was lung ca   Healthy Daughter    Colon cancer Neg Hx    Stroke Neg Hx     ALLERGIES:  is allergic to niacin and ativan [lorazepam].  MEDICATIONS:  Current Outpatient Medications  Medication Sig Dispense Refill   atorvastatin (LIPITOR) 10 MG tablet TAKE 1 TABLET BY MOUTH EVERY DAY FOR CHOLESTEROL 90 tablet 2   cholecalciferol (VITAMIN D3)  25 MCG (1000 UNIT) tablet Take 1,000 Units by mouth daily.     clopidogrel (PLAVIX) 75 MG tablet TAKE 1 TABLET BY MOUTH EVERY DAY 90 tablet 3   cyanocobalamin (VITAMIN B12) 500 MCG tablet Take 500 mcg by mouth daily.     glucose blood (ACCU-CHEK GUIDE) test strip Use as instructed 100 strip 3   No current facility-administered medications for this visit.    REVIEW OF SYSTEMS:   Constitutional: ( - ) fevers, ( - )  chills , ( - ) night sweats Eyes: ( - ) blurriness of vision, ( - ) double vision, ( - ) watery eyes Ears, nose, mouth, throat, and face: ( - ) mucositis, ( - ) sore throat Respiratory: ( - ) cough, ( - ) dyspnea, ( - ) wheezes Cardiovascular: ( - ) palpitation, ( - ) chest discomfort, ( - ) lower extremity swelling Gastrointestinal:  ( - ) nausea, ( - ) heartburn, ( - ) change in bowel habits Skin: ( - ) abnormal skin rashes Lymphatics: ( - ) new lymphadenopathy, ( - ) easy bruising Neurological: ( - ) numbness, ( - ) tingling, ( - ) new weaknesses Behavioral/Psych: ( - ) mood change, ( - ) new changes  All other systems were reviewed with the patient and are negative.  PHYSICAL EXAMINATION:  Vitals:   11/22/23 1411  BP: (!) 141/70  Pulse: 84  Resp: 15  Temp: 98.1 F (36.7 C)  SpO2: 96%   Filed Weights   11/22/23 1411  Weight: 209 lb (94.8 kg)    GENERAL: Well-appearing elderly Caucasian male, alert, no distress and comfortable SKIN:  skin color, texture, turgor are normal, no rashes or significant lesions EYES: conjunctiva are pink and non-injected, sclera clear LUNGS: clear to auscultation and percussion with normal breathing effort HEART: regular rate & rhythm and no murmurs and no lower extremity edema Musculoskeletal: no cyanosis of digits and no clubbing  PSYCH: alert & oriented x 3, fluent speech NEURO: no focal motor/sensory deficits  LABORATORY DATA:  I have reviewed the data as listed    Latest Ref Rng & Units 11/22/2023    1:31 PM 11/05/2023    2:57 PM 10/28/2023    1:00 PM  CBC  WBC 4.0 - 10.5 K/uL 7.3  7.6  8.3   Hemoglobin 13.0 - 17.0 g/dL 32.4  40.1  02.7   Hematocrit 39.0 - 52.0 % 45.8  43.5  44.7   Platelets 150 - 400 K/uL 218  257  296        Latest Ref Rng & Units 11/22/2023    1:31 PM 11/05/2023    2:57 PM 10/28/2023   12:15 PM  CMP  Glucose 70 - 99 mg/dL 253  664  403   BUN 8 - 23 mg/dL 11  13  11    Creatinine 0.61 - 1.24 mg/dL 4.74  2.59  5.63   Sodium 135 - 145 mmol/L 138  140  143   Potassium 3.5 - 5.1 mmol/L 3.7  3.6  3.6   Chloride 98 - 111 mmol/L 102  105  101   CO2 22 - 32 mmol/L 29  28  28    Calcium 8.9 - 10.3 mg/dL 8.8  8.7  8.3   Total Protein 6.5 - 8.1 g/dL 7.0  6.7  5.9   Total Bilirubin <1.2 mg/dL 0.8  0.6  0.5   Alkaline Phos 38 - 126 U/L 105  88  121   AST 15 -  41 U/L 17  19  20    ALT 0 - 44 U/L 13  13  14      RADIOGRAPHIC STUDIES: ECHOCARDIOGRAM COMPLETE  Result Date: 10/28/2023    ECHOCARDIOGRAM REPORT   Patient Name:   NATANAEL BERRETTA Date of Exam: 10/28/2023 Medical Rec #:  [id number]        Height:       72.0 in Accession #:    [id number]       Weight:       208.4 lb Date of Birth:  1941-07-21       BSA:          2.168 m Patient Age:    28 years         BP:           144/86 mmHg Patient Gender: M                HR:           85 bpm. Exam Location:  [address] Procedure: 2D Echo, 3D Echo, Cardiac Doppler and Color Doppler Indications:    R60 Lower extremity  edema  History:        Patient has prior history of Echocardiogram examinations, most                 recent 03/01/2022. Carotid Disease; Risk Factors:Hypertension and                 Diabetes.  Sonographer:    Clearence Ped RCS Referring Phys: [id number] PETER C NISHAN IMPRESSIONS  1. Left ventricular ejection fraction, by estimation, is 50 to 55%. The left ventricle has low normal function. The left ventricle has no regional wall motion abnormalities. Left ventricular diastolic parameters are consistent with Grade I diastolic dysfunction (impaired relaxation).  2. Right ventricular systolic function is normal. The right ventricular size is normal. Tricuspid regurgitation signal is inadequate for assessing PA pressure.  3. The mitral valve is normal in structure. No evidence of mitral valve regurgitation. No evidence of mitral stenosis.  4. The aortic valve is tricuspid. There is mild calcification of the aortic valve. Aortic valve regurgitation is trivial. No aortic stenosis is present.  5. The inferior vena cava is normal in size with greater than 50% respiratory variability, suggesting right atrial pressure of 3 mmHg. FINDINGS  Left Ventricle: Left ventricular ejection fraction, by estimation, is 50 to 55%. The left ventricle has low normal function. The left ventricle has no regional wall motion abnormalities. The left ventricular internal cavity size was normal in size. There is no left ventricular hypertrophy. Left ventricular diastolic parameters are consistent with Grade I diastolic dysfunction (impaired relaxation). Right Ventricle: The right ventricular size is normal. No increase in right ventricular wall thickness. Right ventricular systolic function is normal. Tricuspid regurgitation signal is inadequate for assessing PA pressure. The tricuspid regurgitant velocity is 0.86 m/s, and with an assumed right atrial pressure of 3 mmHg, the estimated right ventricular systolic pressure is 5.9 mmHg. Left Atrium: Left  atrial size was normal in size. Right Atrium: Right atrial size was normal in size. Pericardium: There is no evidence of pericardial effusion. Mitral Valve: The mitral valve is normal in structure. No evidence of mitral valve regurgitation. No evidence of mitral valve stenosis. Tricuspid Valve: The tricuspid valve is normal in structure. Tricuspid valve regurgitation is not demonstrated. Aortic Valve: The aortic valve is tricuspid. There is mild calcification of the aortic valve. Aortic valve regurgitation is trivial. Aortic  regurgitation PHT measures 401 msec. No aortic stenosis is present. Pulmonic Valve: The pulmonic valve was normal in structure. Pulmonic valve regurgitation is trivial. Aorta: The aortic root is normal in size and structure. Venous: The inferior vena cava is normal in size with greater than 50% respiratory variability, suggesting right atrial pressure of 3 mmHg. IAS/Shunts: No atrial level shunt detected by color flow Doppler.  LEFT VENTRICLE PLAX 2D LVIDd:         4.20 cm   Diastology LVIDs:         3.00 cm   LV e' medial:    5.66 cm/s LV PW:         1.00 cm   LV E/e' medial:  10.0 LV IVS:        1.10 cm   LV e' lateral:   4.24 cm/s LVOT diam:     2.10 cm   LV E/e' lateral: 13.3 LV SV:         63 LV SV Index:   29 LVOT Area:     3.46 cm                           3D Volume EF:                          3D EF:        51 %                          LV EDV:       94 ml                          LV ESV:       46 ml                          LV SV:        48 ml RIGHT VENTRICLE RV Basal diam:  3.40 cm RV S prime:     11.90 cm/s TAPSE (M-mode): 1.8 cm RVSP:           5.9 mmHg LEFT ATRIUM             Index        RIGHT ATRIUM           Index LA diam:        4.20 cm 1.94 cm/m   RA Pressure: 3.00 mmHg LA Vol (A2C):   45.8 ml 21.12 ml/m  RA Area:     12.30 cm LA Vol (A4C):   60.0 ml 27.67 ml/m  RA Volume:   31.30 ml  14.44 ml/m LA Biplane Vol: 56.3 ml 25.97 ml/m  AORTIC VALVE LVOT Vmax:   90.70 cm/s  LVOT Vmean:  57.600 cm/s LVOT VTI:    0.183 m AI PHT:      401 msec  AORTA Ao Root diam: 3.40 cm Ao Asc diam:  3.20 cm MITRAL VALVE               TRICUSPID VALVE MV Area (PHT):             TR Peak grad:   2.9 mmHg MV Decel Time:             TR Vmax:        85.60 cm/s MV E velocity:  56.60 cm/s  Estimated RAP:  3.00 mmHg MV A velocity: 74.90 cm/s  RVSP:           5.9 mmHg MV E/A ratio:  0.76                            SHUNTS                            Systemic VTI:  0.18 m                            Systemic Diam: 2.10 cm Dalton McleanMD Electronically signed by Wilfred Lacy Signature Date/Time: 10/28/2023/12:35:39 PM    Final     ASSESSMENT & PLAN Alexia Freestone 82 y.o. male with medical history significant for hereditary hemochromatosis. who presents for a follow up visit.   After review of the labs, review of the records, and discussion with the patient the patients findings are most consistent with elevated ferritin of unclear etiology.    Elevated serum ferritin levels have numerous possible etiologies. These include hereditary hemochromatosis (heterozygous or homozygous), inflammation, liver disease, or iron overload from an exogenous source. Hereditary hemochromatosis is a hereditary condition caused by mutations in the HFE gene, which regulates iron absorption. The most common genes mutated in this condition are the C282Y and H63D genes. Homozygous mutations represent a disease state which requires phlebotomy to decrease ferritin levels to a goal of <50  (Blood (2010) 116 (3): 317-325). The goal is to decrease ferritin so there is no deposition in critical organs (liver, heart, pancreas and thyroid). Heterozygous mutations (or compound heterozygotes) rarely require phlebotomy, but do have elevated serum iron/ferritin levels.  Ferritin is an acute phase reactant and can be elevated with systemic inflammation. Direct damage to liver tissue can also cause spillage of ferritin into the blood, resulting  in elevated ferritin.  Additionally, serum iron levels can be quite transient and an elevation or serum iron may not represent a true overload of total body iron (best lab for this is ferritin).    #Elevated Iron/Ferritin # Hereditary Hemochromatosis C282Y Homozygous  --patient found to have homozygous mutation for HFE,  will continue phlebotomies every other week with goal ferritin <50  --labs today show white blood cell 7.3, hemoglobin 15.5, MCV 104.6, platelets 218 --Ferritin 353 on 11/22/2023.  Goal ferritin less than 50 --RTC pending in 12 weeks with interval every 2 week phlebotomies.  No orders of the defined types were placed in this encounter.   All questions were answered. The patient knows to call the clinic with any problems, questions or concerns.  A total of more than 30 minutes were spent on this encounter with face-to-face time and non-face-to-face time, including preparing to see the patient, ordering tests and/or medications, counseling the patient and coordination of care as outlined above.   Ulysees Barns, MD Department of Hematology/Oncology [facility] at [facility] Phone: [phone] Pager: [phone] Email: [email or web address]  11/25/2023 9:39 AM

## 2023-11-22 NOTE — Progress Notes (Signed)
[name] [name] presents today for phlebotomy per MD orders. Phlebotomy procedure started at 1501 and ended at 1513. 449 grams removed via 16G LAC Patient observed for 30 minutes after procedure without any incident. Patient tolerated procedure well. IV needle removed intact.

## 2023-11-22 NOTE — Patient Instructions (Signed)

## 2023-11-25 ENCOUNTER — Encounter: Payer: Self-pay | Admitting: Hematology and Oncology

## 2023-11-28 DIAGNOSIS — R2681 Unsteadiness on feet: Secondary | ICD-10-CM | POA: Diagnosis not present

## 2023-11-28 DIAGNOSIS — R296 Repeated falls: Secondary | ICD-10-CM | POA: Diagnosis not present

## 2023-11-28 DIAGNOSIS — I639 Cerebral infarction, unspecified: Secondary | ICD-10-CM | POA: Diagnosis not present

## 2023-12-02 ENCOUNTER — Telehealth: Payer: Self-pay | Admitting: Hematology and Oncology

## 2023-12-03 ENCOUNTER — Telehealth: Payer: Self-pay | Admitting: Cardiovascular Disease

## 2023-12-03 NOTE — Telephone Encounter (Signed)
Pt's wife states that the pt needs his medical records sent over for a grant they are trying to get. Pt's wife states they faxed it over but have not heard back. Please advise

## 2023-12-03 NOTE — Telephone Encounter (Signed)
Spoke with patients wife and let her know I did not see a release for records for Dr. Eden Emms.  I do see the other 2 releases for his PCP and Neurologist.  I called and left message fkor Sue Lush at [phone] asking to send another release for Dr. Eden Emms so we can get this done ASAP.  Wife aware and will follow up with wife regarding what is going on with records.

## 2023-12-04 DIAGNOSIS — M7552 Bursitis of left shoulder: Secondary | ICD-10-CM | POA: Diagnosis not present

## 2023-12-04 NOTE — Telephone Encounter (Signed)
Left second message with Sue Lush to return call about getting records.

## 2023-12-06 ENCOUNTER — Inpatient Hospital Stay: Payer: Medicare PPO | Attending: Hematology and Oncology

## 2023-12-06 ENCOUNTER — Inpatient Hospital Stay: Payer: Medicare PPO

## 2023-12-06 DIAGNOSIS — R7989 Other specified abnormal findings of blood chemistry: Secondary | ICD-10-CM | POA: Diagnosis not present

## 2023-12-06 LAB — CMP (CANCER CENTER ONLY)
ALT: 13 U/L (ref 0–44)
AST: 17 U/L (ref 15–41)
Albumin: 4.2 g/dL (ref 3.5–5.0)
Alkaline Phosphatase: 95 U/L (ref 38–126)
Anion gap: 7 (ref 5–15)
BUN: 20 mg/dL (ref 8–23)
CO2: 29 mmol/L (ref 22–32)
Calcium: 9.4 mg/dL (ref 8.9–10.3)
Chloride: 103 mmol/L (ref 98–111)
Creatinine: 1.04 mg/dL (ref 0.61–1.24)
GFR, Estimated: >60 mL/min (ref >60)
Glucose, Bld: 152 mg/dL — ABNORMAL HIGH (ref 70–99)
Potassium: 4.2 mmol/L (ref 3.5–5.1)
Sodium: 139 mmol/L (ref 135–145)
Total Bilirubin: 0.5 mg/dL (ref <1.2)
Total Protein: 7.4 g/dL (ref 6.5–8.1)

## 2023-12-06 LAB — CBC WITH DIFFERENTIAL (CANCER CENTER ONLY)
Abs Immature Granulocytes: 0.05 10*3/uL (ref 0.00–0.07)
Basophils Absolute: 0 10*3/uL (ref 0.0–0.1)
Basophils Relative: 0 %
Eosinophils Absolute: 0 10*3/uL (ref 0.0–0.5)
Eosinophils Relative: 0 %
HCT: 46.5 % (ref 39.0–52.0)
Hemoglobin: 15.7 g/dL (ref 13.0–17.0)
Immature Granulocytes: 1 %
Lymphocytes Relative: 12 %
Lymphs Abs: 1.2 10*3/uL (ref 0.7–4.0)
MCH: 35.4 pg — ABNORMAL HIGH (ref 26.0–34.0)
MCHC: 33.8 g/dL (ref 30.0–36.0)
MCV: 104.7 fL — ABNORMAL HIGH (ref 80.0–100.0)
Monocytes Absolute: 0.6 10*3/uL (ref 0.1–1.0)
Monocytes Relative: 6 %
Neutro Abs: 7.9 10*3/uL — ABNORMAL HIGH (ref 1.7–7.7)
Neutrophils Relative %: 81 %
Platelet Count: 284 10*3/uL (ref 150–400)
RBC: 4.44 MIL/uL (ref 4.22–5.81)
RDW: 13.1 % (ref 11.5–15.5)
WBC Count: 9.8 10*3/uL (ref 4.0–10.5)
nRBC: 0 % (ref 0.0–0.2)

## 2023-12-06 LAB — FERRITIN: Ferritin: 314 ng/mL (ref 24–336)

## 2023-12-06 NOTE — Progress Notes (Signed)
[name] [name] presents today for phlebotomy per MD orders. Phlebotomy procedure started at 1619 and ended at 1632. 16G phleb kit used to L-AC. 487 grams removed. Patient observed for 15 minutes after procedure without any incident. Patient tolerated procedure well. IV needle removed intact.

## 2023-12-12 ENCOUNTER — Telehealth: Payer: Self-pay | Admitting: Cardiovascular Disease

## 2023-12-12 NOTE — Telephone Encounter (Signed)
Left message for patient to call back.   Patient's wife called back, gave her a number to call HIM to get a released emailed to her than she could email it back to HIM.

## 2023-12-12 NOTE — Telephone Encounter (Signed)
Wife calling back to see if we have the medical records request yet. She would like a call back.See previous phone notes

## 2023-12-20 ENCOUNTER — Inpatient Hospital Stay: Payer: Medicare PPO

## 2023-12-20 ENCOUNTER — Other Ambulatory Visit: Payer: Medicare PPO

## 2023-12-20 ENCOUNTER — Telehealth: Payer: Self-pay | Admitting: Hematology and Oncology

## 2023-12-27 ENCOUNTER — Inpatient Hospital Stay: Payer: Medicare PPO

## 2023-12-27 DIAGNOSIS — R7989 Other specified abnormal findings of blood chemistry: Secondary | ICD-10-CM | POA: Diagnosis not present

## 2023-12-27 LAB — CBC WITH DIFFERENTIAL (CANCER CENTER ONLY)
Abs Immature Granulocytes: 0.06 10*3/uL (ref 0.00–0.07)
Basophils Absolute: 0.1 10*3/uL (ref 0.0–0.1)
Basophils Relative: 1 %
Eosinophils Absolute: 0.1 10*3/uL (ref 0.0–0.5)
Eosinophils Relative: 1 %
HCT: 45.8 % (ref 39.0–52.0)
Hemoglobin: 16 g/dL (ref 13.0–17.0)
Immature Granulocytes: 1 %
Lymphocytes Relative: 17 %
Lymphs Abs: 1.4 10*3/uL (ref 0.7–4.0)
MCH: 35.8 pg — ABNORMAL HIGH (ref 26.0–34.0)
MCHC: 34.9 g/dL (ref 30.0–36.0)
MCV: 102.5 fL — ABNORMAL HIGH (ref 80.0–100.0)
Monocytes Absolute: 0.6 10*3/uL (ref 0.1–1.0)
Monocytes Relative: 7 %
Neutro Abs: 6.1 10*3/uL (ref 1.7–7.7)
Neutrophils Relative %: 73 %
Platelet Count: 221 10*3/uL (ref 150–400)
RBC: 4.47 MIL/uL (ref 4.22–5.81)
RDW: 12.6 % (ref 11.5–15.5)
WBC Count: 8.3 10*3/uL (ref 4.0–10.5)
nRBC: 0 % (ref 0.0–0.2)

## 2023-12-27 LAB — CMP (CANCER CENTER ONLY)
ALT: 19 U/L (ref 0–44)
AST: 22 U/L (ref 15–41)
Albumin: 3.9 g/dL (ref 3.5–5.0)
Alkaline Phosphatase: 108 U/L (ref 38–126)
Anion gap: 7 (ref 5–15)
BUN: 21 mg/dL (ref 8–23)
CO2: 29 mmol/L (ref 22–32)
Calcium: 9.2 mg/dL (ref 8.9–10.3)
Chloride: 105 mmol/L (ref 98–111)
Creatinine: 0.97 mg/dL (ref 0.61–1.24)
GFR, Estimated: >60 mL/min (ref >60)
Glucose, Bld: 173 mg/dL — ABNORMAL HIGH (ref 70–99)
Potassium: 3.7 mmol/L (ref 3.5–5.1)
Sodium: 141 mmol/L (ref 135–145)
Total Bilirubin: 0.4 mg/dL (ref <1.2)
Total Protein: 7.1 g/dL (ref 6.5–8.1)

## 2023-12-27 NOTE — Progress Notes (Signed)
[name] [name] presents today for phlebotomy per MD orders. Phlebotomy procedure started at 1554 and ended at 1606. 538 grams removed. 16G phlebotomy kit used. Patient observed for 30 minutes after procedure without any incident. Patient tolerated procedure well. IV needle removed intact.

## 2023-12-28 DIAGNOSIS — R2681 Unsteadiness on feet: Secondary | ICD-10-CM | POA: Diagnosis not present

## 2023-12-28 DIAGNOSIS — R296 Repeated falls: Secondary | ICD-10-CM | POA: Diagnosis not present

## 2023-12-28 DIAGNOSIS — I639 Cerebral infarction, unspecified: Secondary | ICD-10-CM | POA: Diagnosis not present

## 2023-12-30 LAB — FERRITIN: Ferritin: 348 ng/mL — ABNORMAL HIGH (ref 24–336)

## 2024-01-03 ENCOUNTER — Inpatient Hospital Stay: Payer: Medicare PPO

## 2024-01-06 ENCOUNTER — Telehealth: Payer: Self-pay | Admitting: *Deleted

## 2024-01-06 NOTE — Telephone Encounter (Signed)
 Received call from pt's wife.  She is calling about her husband's phlebotomies.  She states he started with [facility] last week and was told that if he continued to need his phlebotomies, they would have to revoke his Hospice benefit.  Discussed this Hospice care he has and wife seems convinced that he has Palliative Care  Hospice, not end of life, full Hospice services despite the fact that he is getting supplies and weekly visits from a nurse etc. Advised that I would check with [facility] to see what type of services he actually does have. Then we can address his phlebotomy.  TCT Randine Nail, RN our [facility]. Spoke with her. She reviewed this pt's information. He does have full Hospice services. His daughter referred him. Advised that we have not received any communication about this patient being under Hospice care. Advised that he is scheduled for every 2 week phlebotomies until the end of February, currently. Per Dr. Federico, these phlebotomies are intended for long term control of iron as over years it can build up in the liver, thyroid  etc and cause problems such as cirrhosis.  He advised that if patient has a less than 6 month prognosis, then phlebotomies are not really needed. Randine states she will contact the patient's case manager and the Hospice team's director to gather more information and for the team there to educate wife on the type of services her husband has.  She will contact me after that discussion. Dr. Federico aware of the above.

## 2024-01-10 ENCOUNTER — Inpatient Hospital Stay: Payer: Medicare PPO

## 2024-01-13 ENCOUNTER — Inpatient Hospital Stay: Payer: Medicare PPO

## 2024-01-13 ENCOUNTER — Inpatient Hospital Stay: Payer: Medicare PPO | Attending: Hematology and Oncology

## 2024-01-13 DIAGNOSIS — R7989 Other specified abnormal findings of blood chemistry: Secondary | ICD-10-CM | POA: Diagnosis not present

## 2024-01-13 LAB — CBC WITH DIFFERENTIAL (CANCER CENTER ONLY)
Abs Immature Granulocytes: 0.05 10*3/uL (ref 0.00–0.07)
Basophils Absolute: 0 10*3/uL (ref 0.0–0.1)
Basophils Relative: 1 %
Eosinophils Absolute: 0.2 10*3/uL (ref 0.0–0.5)
Eosinophils Relative: 2 %
HCT: 42.2 % (ref 39.0–52.0)
Hemoglobin: 14.6 g/dL (ref 13.0–17.0)
Immature Granulocytes: 1 %
Lymphocytes Relative: 18 %
Lymphs Abs: 1.4 10*3/uL (ref 0.7–4.0)
MCH: 34.4 pg — ABNORMAL HIGH (ref 26.0–34.0)
MCHC: 34.6 g/dL (ref 30.0–36.0)
MCV: 99.5 fL (ref 80.0–100.0)
Monocytes Absolute: 0.6 10*3/uL (ref 0.1–1.0)
Monocytes Relative: 8 %
Neutro Abs: 5.5 10*3/uL (ref 1.7–7.7)
Neutrophils Relative %: 70 %
Platelet Count: 243 10*3/uL (ref 150–400)
RBC: 4.24 MIL/uL (ref 4.22–5.81)
RDW: 13.2 % (ref 11.5–15.5)
WBC Count: 7.8 10*3/uL (ref 4.0–10.5)
nRBC: 0 % (ref 0.0–0.2)

## 2024-01-13 LAB — CMP (CANCER CENTER ONLY)
ALT: 11 U/L (ref 0–44)
AST: 16 U/L (ref 15–41)
Albumin: 3.6 g/dL (ref 3.5–5.0)
Alkaline Phosphatase: 103 U/L (ref 38–126)
Anion gap: 6 (ref 5–15)
BUN: 15 mg/dL (ref 8–23)
CO2: 28 mmol/L (ref 22–32)
Calcium: 8.7 mg/dL — ABNORMAL LOW (ref 8.9–10.3)
Chloride: 102 mmol/L (ref 98–111)
Creatinine: 0.87 mg/dL (ref 0.61–1.24)
GFR, Estimated: >60 mL/min (ref >60)
Glucose, Bld: 160 mg/dL — ABNORMAL HIGH (ref 70–99)
Potassium: 4 mmol/L (ref 3.5–5.1)
Sodium: 136 mmol/L (ref 135–145)
Total Bilirubin: 0.5 mg/dL (ref 0.0–1.2)
Total Protein: 6.5 g/dL (ref 6.5–8.1)

## 2024-01-13 NOTE — Progress Notes (Signed)
 [name] [name] presents today for phlebotomy per MD orders. Phlebotomy procedure started at 1604 and ended at 1611. 548 grams removed. 16g IV kit used. IV needle removed intact. Patient provided snack and beverage and observed for 15 minutes after procedure without any incident. Patient tolerated procedure well.

## 2024-01-14 LAB — FERRITIN: Ferritin: 195 ng/mL (ref 24–336)

## 2024-01-17 ENCOUNTER — Inpatient Hospital Stay: Payer: Medicare PPO

## 2024-01-17 ENCOUNTER — Telehealth: Payer: Self-pay | Admitting: Hematology and Oncology

## 2024-01-17 ENCOUNTER — Telehealth: Payer: Self-pay | Admitting: *Deleted

## 2024-01-17 ENCOUNTER — Other Ambulatory Visit: Payer: Medicare PPO

## 2024-01-17 NOTE — Telephone Encounter (Signed)
Received message from scheduler that daughter needed a call back related to pt's phlebotomies and Hospice care. TCT Verda Cumins. Spoke with her.  She states that Hospice has spoken with her family again about the possibility of revoking hospice benefit as pt has continued with his phlebotomies-his request to do that. He had a phlebotomy on Monday and tolerated the procedure well but michelle states he had 2 TIA's this week. Unclear if these are related to phlebotomy ort his baseline health issues with TIS's and strokes. He is on Hospice d/t repeated CVA's. Discussed at length regarding his phlebotomies to reduce his iron load and what potential consequences were to continue them and lose Hospice services or to stop them and continue with Hospice. Discussed her parents' priorities in his care needs etc. [name] does say the Hospice Care is more vital and important to them at this point vs the long term effects of increased stored iron on liver function etc. [name] believes the Hospice care is more important. She is requesting to cancel next 2 phlebotomies and to have Dr. Leonides Schanz see pt and discuss options. Scheduling message sent

## 2024-01-24 ENCOUNTER — Inpatient Hospital Stay: Payer: Medicare PPO

## 2024-01-28 DIAGNOSIS — I639 Cerebral infarction, unspecified: Secondary | ICD-10-CM | POA: Diagnosis not present

## 2024-01-28 DIAGNOSIS — R296 Repeated falls: Secondary | ICD-10-CM | POA: Diagnosis not present

## 2024-01-28 DIAGNOSIS — R2681 Unsteadiness on feet: Secondary | ICD-10-CM | POA: Diagnosis not present

## 2024-01-28 NOTE — Progress Notes (Signed)
Rescheduled

## 2024-01-29 ENCOUNTER — Telehealth: Payer: Self-pay | Admitting: Hematology and Oncology

## 2024-01-29 ENCOUNTER — Inpatient Hospital Stay: Payer: Medicare PPO

## 2024-01-29 ENCOUNTER — Inpatient Hospital Stay: Payer: Medicare PPO | Admitting: Hematology and Oncology

## 2024-01-29 DIAGNOSIS — R7989 Other specified abnormal findings of blood chemistry: Secondary | ICD-10-CM

## 2024-01-31 ENCOUNTER — Other Ambulatory Visit: Payer: Medicare PPO

## 2024-01-31 ENCOUNTER — Encounter: Payer: Self-pay | Admitting: Hematology and Oncology

## 2024-01-31 ENCOUNTER — Inpatient Hospital Stay: Payer: Medicare PPO

## 2024-02-04 NOTE — Progress Notes (Deleted)
 Patient ID: [name] [name], male   DOB: 23-Oct-1941, 83 y.o.   MRN: [id number]     Cardiology Office Note   Date:  02/04/2024   ID:  [name] [name], [name] [name] 04-05-1941, MRN [id number]  PCP:  Doreene Nest, NP  Cardiologist:  Dr. Eden Emms    No chief complaint on file.      History of Present Illness: [name] [name] is a 83 y.o. male  known coronary disease with previous coronary bypass surgery in 2005. he has normal LV function.  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 70-80% proximal ICA but no laterality given Last duplex 10/17/20 had patent right CEA and 40-59% Left ICA stenosis Infarct felt to be from small vessel disease with associated lacunar infarcts but recommended f/u with [facility] for left ICA   Seen by DR Early and had left CEA 05/18/21   He has more LE edema This is not CHF He has venous insufficiency and varicosities Coreg was d/c but has not helped a lot.    He also has diagnosis of hemachromatosis. Ferritin mildly elevated 11/05/23 at 361  Seen in ED 10/13/23 and then by  Dr Tenny Craw 10/22/4 with some postural symptoms and LE edema. Hydrochlorothiazide held and indicated to tolerate higher systolic pressures at rest/sitting. K 3.6 Cortisol and TSH normal Hct 46 Dizziness improved off hydrochlorothiazide   Updated TTE 10/28/23 EF 50-55% grade one diastolic Normal RV no significant valve dx  His daughter from [place] brought him today. He is not on any meds for DM and not taking any K/hydrochlorothiazide He has mild LE edema from his venous dx. Getting  phlebotomy for his Hemachromatosis every 2 weeks Hct is 42.2   ***   Past Medical History:  Diagnosis Date   Aspiration pneumonia vs. CAP 02/28/2022   Basal cell carcinoma 11/09/2019   nod & infil-behind right ear-cx3 &exc   Basal cell carcinoma 03/21/2020   Residual BCC with peripheral margin involved - ST recommends MOHs   Carotid artery occlusion    Coronary artery disease    s/p CABG 2005; sees Dr Eden Emms yearly   Diabetes mellitus without complication (HCC)    Gynecomastia    History of COVID-19 01/08/2022   Hypercholesterolemia    Hypertension    Neurodermatitis    Overweight(278.02)    Personal history of colonic polyps 10/08/2006   tubular adenomas   Renal insufficiency    Stroke [facility])    TIA  April 2022   Transient ischemic attack 2010   "lasted ~ 5 seconds"   Vitamin D deficiency     Past Surgical History:  Procedure Laterality Date   CARDIAC CATHETERIZATION  02/2004   "tried to stent; couldn't"   CAROTID ENDARTERECTOMY Right 08/26/2015   COLONOSCOPY     CORONARY ANGIOPLASTY     CORONARY ARTERY BYPASS GRAFT  Feb. 2005   4 vessel   ENDARTERECTOMY Right 08/26/2015   Procedure: Right Carotid ENDARTERECTOMY with Patch Angioplasty ;  Surgeon: Larina Earthly, MD;  Location: [facility] OR;  Service: Vascular;  Laterality: Right;   ENDARTERECTOMY Left 05/18/2021   Procedure: LEFT CAROTID ARTERY ENDARTERECTOMY  with patch angioplasty;  Surgeon: Larina Earthly, MD;  Location: [facility] OR;  Service: Vascular;  Laterality: Left;   EYE SURGERY     bilateral cataract   KELOID EXCISION  04/2008   on chest scar; Dr. Stephens November   KELOID EXCISION     PILONIDAL CYST EXCISION  1989     Current Outpatient Medications  Medication Sig Dispense Refill   atorvastatin (LIPITOR) 10 MG tablet TAKE 1 TABLET BY MOUTH EVERY DAY FOR CHOLESTEROL 90 tablet 2   cholecalciferol (VITAMIN D3) 25 MCG (1000 UNIT) tablet Take 1,000 Units by mouth daily.     clopidogrel (PLAVIX) 75 MG tablet TAKE 1 TABLET BY  MOUTH EVERY DAY 90 tablet 3   cyanocobalamin (VITAMIN B12) 500 MCG tablet Take 500 mcg by mouth daily.     glucose blood (ACCU-CHEK GUIDE) test strip Use as instructed 100 strip 3   No current facility-administered medications for this visit.    Allergies:   Niacin and Ativan [lorazepam]    Social History:  The patient  reports that he has never smoked. He has quit using smokeless tobacco. He reports that he does not drink alcohol and does not use drugs.   Family History:  The patient's family history includes Arthritis in his brother; Diabetes in his brother, mother, and sister; Fibromyalgia in his sister; Healthy in his daughter; Heart attack (age of onset: 59) in his mother; Heart disease in his brother, brother, and mother; Kidney disease in his mother; Lung cancer in his paternal uncle; Lung cancer (age of onset: 27) in his father.    ROS:  General:no colds or fevers, no weight changes Skin:no rashes or ulcers HEENT:no blurred vision, no congestion CV:see HPI PUL:see HPI GI:no diarrhea constipation or melena, no indigestion GU:no hematuria, no dysuria MS:no joint pain, no claudication Neuro:no syncope, no lightheadedness Endo:no diabetes, no thyroid disease  Wt Readings from Last 3 Encounters:  11/22/23 209 lb (94.8 kg)  11/19/23 204 lb (92.5 kg)  10/22/23 208 lb 6.4 oz (94.5 kg)     PHYSICAL EXAM: VS:  There were no vitals taken for this visit. , BMI There is no height or weight on file to calculate BMI. Affect appropriate Healthy:  appears stated age HEENT: normal Neck supple with no adenopathy Post bilateral CEA  JVP normal post bilateral CEA  Lungs clear with no wheezing and good diaphragmatic motion Heart:  S1/S2 no murmur, no rub, gallop or click PMI normal Abdomen: benighn, BS positve, no tenderness, no AAA no bruit.  No HSM or HJR Distal pulses intact with no bruits Plus one bilateral edema with varicosities  Neuro mild dysarthria  Skin warm and dry No  muscular weakness    EKG:   10/16/19 SR rate 70 nonspecific ST changes   Recent Labs: 10/13/2023: B Natriuretic Peptide 233.6 10/28/2023: NT-Pro BNP 711; TSH 2.970 01/13/2024: ALT 11; BUN 15; Creatinine 0.87; Hemoglobin 14.6; Platelet Count 243; Potassium 4.0; Sodium 136    Lipid Panel    Component Value Date/Time   CHOL 130 07/12/2023 1030   TRIG 223.0 (H) 07/12/2023 1030   HDL 31.00 (L) 07/12/2023 1030   CHOLHDL 4 07/12/2023 1030   VLDL 44.6 (H) 07/12/2023 1030   LDLCALC 55 03/01/2022 0615   LDLDIRECT 63.0 07/12/2023 1030       Other studies Reviewed:  Echo 10/28/23  IMPRESSIONS     1. Left  ventricular ejection fraction, by estimation, is 50 to 55%. The  left ventricle has low normal function. The left ventricle has no regional  wall motion abnormalities. Left ventricular diastolic parameters are  consistent with Grade I diastolic  dysfunction (impaired relaxation).   2. Right ventricular systolic function is normal. The right ventricular  size is normal. Tricuspid regurgitation signal is inadequate for assessing  PA pressure.   3. The mitral valve is normal in structure. No evidence of mitral valve  regurgitation. No evidence of mitral stenosis.   4. The aortic valve is tricuspid. There is mild calcification of the  aortic valve. Aortic valve regurgitation is trivial. No aortic stenosis is  present.   5. The inferior vena cava is normal in size with greater than 50%  respiratory variability, suggesting right atrial pressure of 3 mmHg.  ASSESSMENT AND PLAN:  1.  CAD s/p CABG 2005 Had no symptoms before CABG  Non ischemic myovue 09/09/18 continue medical Rx    2. Carotid:  Post right CEA Thalamic stroke April 2022 CTA suggested left ICA stenosis now post Left CEA by  Dr Arbie Cookey 05/18/21 f/u duplex   3.  HTN  see below regarding postural symptoms   4. Hyperlipidemia stable on statin. Last LDL 56 on 20 mg lipitor   5. DM:  Discussed low carb diet.  Target  hemoglobin A1c is 6.5 or less.  Continue current medications. He is getting Lab work in June with primary   6. Edema:  from LE venous disease Reflux documented by duplex 08/02/21 He has preferred hydrochlorothiazide to lasix in past would use PRN given low K, fatigue and postural symptoms   7. COVID:  01/08/22 Rx with antiviral no CXR done has had 4 vaccines   8. Postural:  ? Related to age, hemachromatosis, and DM.  Improved off diuretic  9. Hemachromatosis:  US liver with abnormal texture ? DNA PCR homozygous abnormality 845G f/u Dr Leonides Schanz Hct 42.2   Time spent reviewing hospital records, and all imaging modalities as well as consults direct patient interview, exam and composing note 45 minutes   F/U Cardiology in a year     [name] [name]

## 2024-02-06 ENCOUNTER — Other Ambulatory Visit: Payer: Self-pay | Admitting: Physician Assistant

## 2024-02-07 ENCOUNTER — Inpatient Hospital Stay: Payer: Medicare PPO

## 2024-02-07 ENCOUNTER — Inpatient Hospital Stay: Payer: Medicare PPO | Attending: Hematology and Oncology

## 2024-02-07 ENCOUNTER — Inpatient Hospital Stay: Payer: Medicare PPO | Admitting: Physician Assistant

## 2024-02-07 DIAGNOSIS — Z85828 Personal history of other malignant neoplasm of skin: Secondary | ICD-10-CM | POA: Insufficient documentation

## 2024-02-07 DIAGNOSIS — Z8673 Personal history of transient ischemic attack (TIA), and cerebral infarction without residual deficits: Secondary | ICD-10-CM | POA: Insufficient documentation

## 2024-02-07 DIAGNOSIS — Z79899 Other long term (current) drug therapy: Secondary | ICD-10-CM | POA: Insufficient documentation

## 2024-02-07 DIAGNOSIS — E876 Hypokalemia: Secondary | ICD-10-CM | POA: Diagnosis not present

## 2024-02-07 DIAGNOSIS — Z801 Family history of malignant neoplasm of trachea, bronchus and lung: Secondary | ICD-10-CM | POA: Insufficient documentation

## 2024-02-07 DIAGNOSIS — E119 Type 2 diabetes mellitus without complications: Secondary | ICD-10-CM | POA: Insufficient documentation

## 2024-02-07 DIAGNOSIS — R7989 Other specified abnormal findings of blood chemistry: Secondary | ICD-10-CM | POA: Diagnosis not present

## 2024-02-07 DIAGNOSIS — E7800 Pure hypercholesterolemia, unspecified: Secondary | ICD-10-CM | POA: Insufficient documentation

## 2024-02-07 DIAGNOSIS — I10 Essential (primary) hypertension: Secondary | ICD-10-CM | POA: Diagnosis not present

## 2024-02-07 DIAGNOSIS — Z860101 Personal history of adenomatous and serrated colon polyps: Secondary | ICD-10-CM | POA: Diagnosis not present

## 2024-02-07 DIAGNOSIS — I2510 Atherosclerotic heart disease of native coronary artery without angina pectoris: Secondary | ICD-10-CM | POA: Diagnosis not present

## 2024-02-07 DIAGNOSIS — Z8616 Personal history of COVID-19: Secondary | ICD-10-CM | POA: Insufficient documentation

## 2024-02-07 DIAGNOSIS — Z7902 Long term (current) use of antithrombotics/antiplatelets: Secondary | ICD-10-CM | POA: Insufficient documentation

## 2024-02-07 LAB — CMP (CANCER CENTER ONLY)
ALT: 12 U/L (ref 0–44)
AST: 16 U/L (ref 15–41)
Albumin: 3.9 g/dL (ref 3.5–5.0)
Alkaline Phosphatase: 104 U/L (ref 38–126)
Anion gap: 6 (ref 5–15)
BUN: 15 mg/dL (ref 8–23)
CO2: 31 mmol/L (ref 22–32)
Calcium: 8.9 mg/dL (ref 8.9–10.3)
Chloride: 103 mmol/L (ref 98–111)
Creatinine: 0.99 mg/dL (ref 0.61–1.24)
GFR, Estimated: >60 mL/min (ref >60)
Glucose, Bld: 177 mg/dL — ABNORMAL HIGH (ref 70–99)
Potassium: 3.3 mmol/L — ABNORMAL LOW (ref 3.5–5.1)
Sodium: 140 mmol/L (ref 135–145)
Total Bilirubin: 0.5 mg/dL (ref 0.0–1.2)
Total Protein: 7.1 g/dL (ref 6.5–8.1)

## 2024-02-07 LAB — FERRITIN: Ferritin: 199 ng/mL (ref 24–336)

## 2024-02-07 LAB — CBC WITH DIFFERENTIAL (CANCER CENTER ONLY)
Abs Immature Granulocytes: 0.04 10*3/uL (ref 0.00–0.07)
Basophils Absolute: 0.1 10*3/uL (ref 0.0–0.1)
Basophils Relative: 1 %
Eosinophils Absolute: 0.2 10*3/uL (ref 0.0–0.5)
Eosinophils Relative: 3 %
HCT: 45.8 % (ref 39.0–52.0)
Hemoglobin: 15.7 g/dL (ref 13.0–17.0)
Immature Granulocytes: 1 %
Lymphocytes Relative: 15 %
Lymphs Abs: 1.1 10*3/uL (ref 0.7–4.0)
MCH: 34.4 pg — ABNORMAL HIGH (ref 26.0–34.0)
MCHC: 34.3 g/dL (ref 30.0–36.0)
MCV: 100.4 fL — ABNORMAL HIGH (ref 80.0–100.0)
Monocytes Absolute: 0.5 10*3/uL (ref 0.1–1.0)
Monocytes Relative: 7 %
Neutro Abs: 5.8 10*3/uL (ref 1.7–7.7)
Neutrophils Relative %: 73 %
Platelet Count: 222 10*3/uL (ref 150–400)
RBC: 4.56 MIL/uL (ref 4.22–5.81)
RDW: 13.4 % (ref 11.5–15.5)
WBC Count: 7.8 10*3/uL (ref 4.0–10.5)
nRBC: 0 % (ref 0.0–0.2)

## 2024-02-07 NOTE — Progress Notes (Signed)
 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 Federico. 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  08/26/2023: start of q 2 week phlebotomies.   Interval History:  [name] [name] 83 y.o. male with medical history significant for hereditary hemochromatosis. who presents for a follow up visit. The patient's last visit was on 11/22/2023 at which time he established care. In the interim since the last visit the patient has continued with every 2-week phlebotomies.  On exam today [name] [name] reports having difficulty with the last phlebotomy on 01/13/2024 with TIA symptoms lasting 15-20 minutes. Symptoms included slurred speech, left sided weakness. Patient's daughter is concerned with continued tolerance with phlebotomies at this time. In addition, she shares that since patient is enrolled in hospice, there is no coverage for the phlebotomies.   Otherwise, he energy and appetite are stable. He denies easy bruising or signs of overt bleeding. He denies fevers, chills, sweats, shortness of breath, chest pain or cough.    MEDICAL HISTORY:  Past Medical History:  Diagnosis Date   Aspiration pneumonia vs. CAP 02/28/2022   Basal cell carcinoma 11/09/2019   nod & infil-behind right ear-cx3 &exc   Basal cell carcinoma 03/21/2020   Residual BCC with peripheral margin involved - ST recommends MOHs   Carotid artery occlusion    Coronary artery disease    s/p CABG 2005; sees Dr [name] yearly   Diabetes mellitus  without complication (HCC)    Gynecomastia    History of COVID-19 01/08/2022   Hypercholesterolemia    Hypertension    Neurodermatitis    Overweight(278.02)    Personal history of colonic polyps 10/08/2006   tubular adenomas   Renal insufficiency    Stroke [facility])    TIA  April 2022   Transient ischemic attack 2010   lasted ~ 5 seconds   Vitamin D  deficiency     SURGICAL HISTORY: Past Surgical History:  Procedure Laterality Date   CARDIAC CATHETERIZATION  02/2004   tried to stent; couldn't   CAROTID ENDARTERECTOMY Right 08/26/2015   COLONOSCOPY     CORONARY ANGIOPLASTY     CORONARY ARTERY BYPASS GRAFT  Feb. 2005   4 vessel   ENDARTERECTOMY Right 08/26/2015   Procedure: Right Carotid ENDARTERECTOMY with Patch Angioplasty ;  Surgeon: [name] [name] Doing, MD;  Location: [facility] OR;  Service: Vascular;  Laterality: Right;   ENDARTERECTOMY Left 05/18/2021   Procedure: LEFT CAROTID ARTERY ENDARTERECTOMY  with patch angioplasty;  Surgeon: [name] [name] JULIANNA, MD;  Location: [facility] OR;  Service: Vascular;  Laterality: Left;   EYE SURGERY     bilateral cataract   KELOID EXCISION  04/2008   on chest scar; Dr. Mercer   KELOID EXCISION     PILONIDAL CYST EXCISION  1989    SOCIAL HISTORY: Social History   Socioeconomic History   Marital status: Married    Spouse name: [name] [name]   Number of children: 1   Years of education: Not on file   Highest education level: Some college, no degree  Occupational History   Occupation: retired  Tobacco Use  Smoking status: Never   Smokeless tobacco: Former  Building Services Engineer status: Never Used  Substance and Sexual Activity   Alcohol use: No    Alcohol/week: 0.0 standard drinks of alcohol   Drug use: No   Sexual activity: Yes  Other Topics Concern   Not on file  Social History Narrative   Retired.   Once worked for the [employer].   Married.   Enjoys reading, spending time with family.    Left handed   Drinks caffeine   One story home   Social Drivers of  Health   Financial Resource Strain: Low Risk  (06/24/2023)   Overall Financial Resource Strain (CARDIA)    Difficulty of Paying Living Expenses: Not hard at all  Food Insecurity: No Food Insecurity (06/24/2023)   Hunger Vital Sign    Worried About Running Out of Food in the Last Year: Never true    Ran Out of Food in the Last Year: Never true  Transportation Needs: No Transportation Needs (06/24/2023)   PRAPARE - Administrator, Civil Service (Medical): No    Lack of Transportation (Non-Medical): No  Physical Activity: Inactive (06/24/2023)   Exercise Vital Sign    Days of Exercise per Week: 0 days    Minutes of Exercise per Session: 0 min  Stress: No Stress Concern Present (06/24/2023)   [name] [name]-davidson of Occupational Health - Occupational Stress Questionnaire    Feeling of Stress : Not at all  Social Connections: Moderately Integrated (06/24/2023)   Social Connection and Isolation Panel [NHANES]    Frequency of Communication with Friends and Family: More than three times a week    Frequency of Social Gatherings with Friends and Family: More than three times a week    Attends Religious Services: More than 4 times per year    Active Member of [employer] or Organizations: No    Attends Banker Meetings: Never    Marital Status: Married  Catering Manager Violence: Not At Risk (06/24/2023)   Humiliation, Afraid, Rape, and Kick questionnaire    Fear of Current or Ex-Partner: No    Emotionally Abused: No    Physically Abused: No    Sexually Abused: No    FAMILY HISTORY: Family History  Problem Relation Age of Onset   Heart disease Mother        Before age 58   Diabetes Mother    Kidney disease Mother    Heart attack Mother 60   Lung cancer Father 56   Diabetes Brother    Heart disease Brother    Heart disease Brother    Arthritis Brother    Diabetes Sister    Fibromyalgia Sister    Lung cancer Paternal Uncle        questionable as to if it was lung ca    Healthy Daughter    Colon cancer Neg Hx    Stroke Neg Hx     ALLERGIES:  is allergic to niacin and ativan  [lorazepam ].  MEDICATIONS:  Current Outpatient Medications  Medication Sig Dispense Refill   atorvastatin  (LIPITOR) 10 MG tablet TAKE 1 TABLET BY MOUTH EVERY DAY FOR CHOLESTEROL 90 tablet 2   cholecalciferol (VITAMIN D3) 25 MCG (1000 UNIT) tablet Take 1,000 Units by mouth daily.     clopidogrel  (PLAVIX ) 75 MG tablet TAKE 1 TABLET BY MOUTH EVERY DAY 90 tablet 3   cyanocobalamin  (VITAMIN B12) 500 MCG tablet Take 500 mcg by mouth daily.  glucose blood (ACCU-CHEK GUIDE) test strip Use as instructed 100 strip 3   No current facility-administered medications for this visit.    REVIEW OF SYSTEMS:   Constitutional: ( - ) fevers, ( - )  chills , ( - ) night sweats Eyes: ( - ) blurriness of vision, ( - ) double vision, ( - ) watery eyes Ears, nose, mouth, throat, and face: ( - ) mucositis, ( - ) sore throat Respiratory: ( - ) cough, ( - ) dyspnea, ( - ) wheezes Cardiovascular: ( - ) palpitation, ( - ) chest discomfort, ( - ) lower extremity swelling Gastrointestinal:  ( - ) nausea, ( - ) heartburn, ( - ) change in bowel habits Skin: ( - ) abnormal skin rashes Lymphatics: ( - ) new lymphadenopathy, ( - ) easy bruising Neurological: ( - ) numbness, ( - ) tingling, ( - ) new weaknesses Behavioral/Psych: ( - ) mood change, ( - ) new changes  All other systems were reviewed with the patient and are negative.  PHYSICAL EXAMINATION:  There were no vitals filed for this visit.  There were no vitals filed for this visit.   GENERAL: Elderly Caucasian male, alert, no distress and comfortable SKIN: skin color, texture, turgor are normal, no rashes or significant lesions EYES: conjunctiva are pink and non-injected, sclera clear LUNGS: clear to auscultation and percussion with normal breathing effort HEART: regular rate & rhythm and no murmurs. +Bilateral lower extremity  edema Musculoskeletal: no cyanosis of digits and no clubbing  PSYCH: alert & oriented x 3, fluent speech NEURO: no focal motor/sensory deficits  LABORATORY DATA:  I have reviewed the data as listed    Latest Ref Rng & Units 02/07/2024   10:17 AM 01/13/2024    3:34 PM 12/27/2023    3:06 PM  CBC  WBC 4.0 - 10.5 K/uL 7.8  7.8  8.3   Hemoglobin 13.0 - 17.0 g/dL 84.2  85.3  83.9   Hematocrit 39.0 - 52.0 % 45.8  42.2  45.8   Platelets 150 - 400 K/uL 222  243  221        Latest Ref Rng & Units 02/07/2024   10:17 AM 01/13/2024    3:34 PM 12/27/2023    3:06 PM  CMP  Glucose 70 - 99 mg/dL 822  839  826   BUN 8 - 23 mg/dL 15  15  21    Creatinine 0.61 - 1.24 mg/dL 9.00  9.12  9.02   Sodium 135 - 145 mmol/L 140  136  141   Potassium 3.5 - 5.1 mmol/L 3.3  4.0  3.7   Chloride 98 - 111 mmol/L 103  102  105   CO2 22 - 32 mmol/L 31  28  29    Calcium  8.9 - 10.3 mg/dL 8.9  8.7  9.2   Total Protein 6.5 - 8.1 g/dL 7.1  6.5  7.1   Total Bilirubin 0.0 - 1.2 mg/dL 0.5  0.5  0.4   Alkaline Phos 38 - 126 U/L 104  103  108   AST 15 - 41 U/L 16  16  22    ALT 0 - 44 U/L 12  11  19      RADIOGRAPHIC STUDIES: No results found.  ASSESSMENT & PLAN [name] [name] 83 y.o. male with medical history significant for hereditary hemochromatosis. who presents for a follow up visit.   After review of the labs, review of the records, and discussion with the patient the patients  findings are most consistent with elevated ferritin of unclear etiology.    Elevated serum ferritin levels have numerous possible etiologies. These include hereditary hemochromatosis (heterozygous or homozygous), inflammation, liver disease, or iron overload from an exogenous source. Hereditary hemochromatosis is a hereditary condition caused by mutations in the HFE gene, which regulates iron absorption. The most common genes mutated in this condition are the C282Y and H63D genes. Homozygous mutations represent a disease state which requires  phlebotomy to decrease ferritin levels to a goal of <50  (Blood (2010) 116 (3): 317-325). The goal is to decrease ferritin so there is no deposition in critical organs (liver, heart, pancreas and thyroid ). Heterozygous mutations (or compound heterozygotes) rarely require phlebotomy, but do have elevated serum iron/ferritin levels.  Ferritin is an acute phase reactant and can be elevated with systemic inflammation. Direct damage to liver tissue can also cause spillage of ferritin into the blood, resulting in elevated ferritin.  Additionally, serum iron levels can be quite transient and an elevation or serum iron may not represent a true overload of total body iron (best lab for this is ferritin).    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 Ferritin pending --Ferritin 195 on 01/13/2024. --Discussed that patient has responded well with phlebotomies thus far and it is reasonable to stop since he is enrolled in hospice care. Reviewed that it will be unlikely for ferritin levels to accumulate so quickly to cause organ damage. Okay to observe at this time.  --RTC in 3 months for a lab check and 6 months for labs/follow up.   #Hypokalemia: --Potassium level is 3.3. Encouraged to eat potassium rich foods.   Orders Placed This Encounter  Procedures   CBC with Differential (Cancer Center Only)    Standing Status:   Future    Expected Date:   05/06/2024    Expiration Date:   02/06/2025   CMP (Cancer Center only)    Standing Status:   Future    Expected Date:   05/06/2024    Expiration Date:   02/06/2025   Ferritin    Standing Status:   Future    Expected Date:   05/06/2024    Expiration Date:   02/06/2025    All questions were answered.  The patient knows to call the clinic with any problems, questions or concerns.  A total of more than 30 minutes were spent on this encounter with face-to-face time and non-face-to-face time, including preparing to see the patient, ordering tests and/or medications, counseling the patient and coordination of care as outlined above.   Johnston Police PA-C Dept of Hematology and Oncology [facility] Phone: [phone]  02/07/2024 1:19 PM

## 2024-02-10 ENCOUNTER — Encounter: Payer: Self-pay | Admitting: Hematology and Oncology

## 2024-02-10 ENCOUNTER — Ambulatory Visit: Payer: Medicare PPO | Admitting: Cardiovascular Disease

## 2024-02-10 ENCOUNTER — Encounter: Payer: Self-pay | Admitting: Physician Assistant

## 2024-02-12 NOTE — Progress Notes (Deleted)
 NEUROLOGY FOLLOW UP OFFICE NOTE  [name] [name] [id number]  Assessment/Plan:   Transient episode of confusion.  ED described aphasia but it really wasn't aphasia.  TIA is possible but uncertain as he did not clearly have lateralizing symptoms.  I do not suspect recrudescence of symptoms from prior stroke in absence of offending etiology such as UTI.  Seizure is possible but less likely.   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  Due to mobility issues and without ability to travel to outpatient PT, will request home PT. 3  Secondary stroke prevention as managed by PCP/cardiology:             - Plavix 75mg  daily alone             - Statin.  LDL goal less than 70             - Hgb A1c goal less than 7             - Normotensive blood pressure 4  From a neurologic standpoint in regards to the stroke, I see no reason to not drive.  However, due to mobility issues and slowed reflexes, cautioned not to drive at this time and to at least undergo formal driving evaluation first. 5  Follow up 6 months.   Total time spent reviewing chart and face to face with patient and family discussing diagnosis and plan:  35 minutes  History of Present Illness:  [name] [name] is an 83 year old left-handed male with HTN, HLD, DMII, CAD, carotid artery disease s/p R CEA and history of TIA who follows up for possible TIA.  She is accompanied by his wife and son-in-law who supplement history.   UPDATE: Current medications: Plavix 75mg  daily, ASA 81mg  daily, atorvastatin 10mg  daily, HCTZ, losartan   Receiving care from [facility] where he has been receiving PT/OT.  He has been having recurrent falls.  Now has a bedside commode.   ***    HISTORY: He was admitted to [facility] from 04/03/2021 to 04/06/2021 for transient left-sided numbness and weakness lasting an hour.  CT head was negative for follow up MRI of brain showed acute small right thalamic infarct with chronic small vessel ischemic changes and multiple chronic lacunar infarcts.  CTA of head and neck showed 70-80% left ICA stenosis.  2D echo showed EF 60-65% with no cardiac source of embolus.  Prior to admission, he was on ASA 81mg  daily.  He was discharged on ASA 81mg  and Plavix 75mg  daily for 3 weeks followed by Plavix alone.   Underwent carotid endarterectomy on 05/18/2021 for asymptomatic left ICA stenosis.   Patient was admitted to the hospital on 02/28/2022 for 2-3 days of left leg weakness, left facial weakness and slurred speech.   MRI of brain showed acute infarct within the right corona radiata.  CTA head and neck showed calcified atherosclerotic plaque in the intercranial ICAs causing mild-moderate stenosis on left and mild on right, left and right carotid endarterectomy with no residual significant stenosis or occlusion, and unchanged moderate right and mild left vertebral artery stenosis.  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  2D echo  showed LVEF 55-60% with Grade I diastolic dysfunction, trival MR, aortic valve sclerosis with no evidence of stenosis with no thrombus or atrial level shunt.  He was discharged on ASA and Plavix for 3 months followed by Plavix alone.   In April 2023, he had started having language difficulty.  He struggled to get words out.  It started to frustrate him.  No slurred speech, facial droop, or unilateral numbness or weakness.  Symptoms lasted 30 minutes.  He remembers some bits but doesn't remember most of the event. Today blood pressure has been 160-170s/70s-100.  Prior to today, then ave been in the 130s/60s.   He has an appointment with his PCP later today.  Denies palpitations.  EEG on 04/25/2022 showed mild generalized background slowing but no epileptiform discharges.  30 day cardiac event monitor in May-June did not reveal a fib.    On 08/21/2022, he had another episode of aphasia, waking up with difficulty putting words together and using incorrect words.  He kept talking about a battery to change for his hearing aid.  He felt confused.  He went to the bathroom and didn't remember why he was in the bathroom.  No headache, facial droop, unilateral numbness/weakness or other lateralizing symptoms.  Symptoms lasted about 2 hours.  Went to ED.  MRI of brain showed mild DWI hyperintensity in the right corona radiata with ADC correlate potentially indicating subacute infarct vs artifact (where he had a previous stroke) but no obvious acute stroke.    He did not have infection such as UTI.  It was proposed that he may have had recrudescence from prior stroke.      PAST MEDICAL HISTORY: Past Medical History:  Diagnosis Date   Aspiration pneumonia vs. CAP 02/28/2022   Basal cell carcinoma 11/09/2019   nod & infil-behind right ear-cx3 &exc   Basal cell carcinoma 03/21/2020   Residual BCC with peripheral margin involved - ST recommends MOHs   Carotid artery occlusion    Coronary artery disease    s/p CABG 2005; sees Dr Eden Emms yearly   Diabetes mellitus without complication (HCC)    Gynecomastia    History of COVID-19 01/08/2022   Hypercholesterolemia    Hypertension    Neurodermatitis    Overweight(278.02)    Personal history of colonic polyps 10/08/2006   tubular adenomas   Renal insufficiency    Stroke [facility])    TIA  April 2022   Transient ischemic attack 2010   "lasted ~ 5 seconds"   Vitamin D deficiency     MEDICATIONS: Current Outpatient Medications on File Prior to Visit  Medication Sig Dispense Refill   atorvastatin (LIPITOR) 10 MG tablet TAKE 1 TABLET BY MOUTH EVERY DAY FOR CHOLESTEROL  90 tablet 2   cholecalciferol (VITAMIN D3) 25 MCG (1000 UNIT) tablet Take 1,000 Units by mouth daily.     clopidogrel (PLAVIX) 75 MG tablet TAKE 1 TABLET BY MOUTH EVERY DAY 90 tablet 3   cyanocobalamin (VITAMIN B12) 500 MCG tablet Take 500 mcg by mouth daily.     glucose blood (ACCU-CHEK GUIDE) test strip Use as instructed 100 strip 3   No current facility-administered medications on file prior to visit.    ALLERGIES: Allergies  Allergen Reactions   Niacin Other (See Comments)    intol to NIACIN w/ headaches    Ativan [Lorazepam] Other (See Comments)    agitation    FAMILY HISTORY: Family History  Problem Relation Age of Onset   Heart disease Mother  Before age 28   Diabetes Mother    Kidney disease Mother    Heart attack Mother 21   Lung cancer Father 63   Diabetes Brother    Heart disease Brother    Heart disease Brother    Arthritis Brother    Diabetes Sister    Fibromyalgia Sister    Lung cancer Paternal Uncle        questionable as to if it was lung ca   Healthy Daughter    Colon cancer Neg Hx    Stroke Neg Hx       Objective:  *** General: No acute distress.  Patient appears well-groomed.   Head:  Normocephalic/atraumatic Eyes:  Fundi examined but not visualized Heart:  Regular rate and rhythm Neurological Exam: alert and oriented.  Speech fluent and not dysarthric, language intact.  CN II-XII intact. Bulk and tone normal.  Limited exam of left upper extremity due to rotator cuff tear.  Decreased dexterity of left hand.  4+/5 left hip flexion; otherwise, muscle strength 5/5 throughout.  Sensation to light touch intact.  Deep tendon reflexes 2+ throughout.  Finger to nose testing intact.  In wheelchair.  Requires upper strength to push self up to stand.  Cautious and unsteady wide-based gait.  Unable to ambulate without assistance.  Romberg with sway.  ***   Shon Millet, DO  CC: Vernona Rieger, NP

## 2024-02-13 ENCOUNTER — Ambulatory Visit: Payer: Medicare PPO | Admitting: Neurology

## 2024-02-13 DIAGNOSIS — R0902 Hypoxemia: Secondary | ICD-10-CM | POA: Diagnosis not present

## 2024-02-13 DIAGNOSIS — I10 Essential (primary) hypertension: Secondary | ICD-10-CM | POA: Diagnosis not present

## 2024-02-13 DIAGNOSIS — Z743 Need for continuous supervision: Secondary | ICD-10-CM | POA: Diagnosis not present

## 2024-02-13 DIAGNOSIS — R0689 Other abnormalities of breathing: Secondary | ICD-10-CM | POA: Diagnosis not present

## 2024-02-13 DIAGNOSIS — M25552 Pain in left hip: Secondary | ICD-10-CM | POA: Diagnosis not present

## 2024-02-14 ENCOUNTER — Inpatient Hospital Stay: Payer: Medicare PPO

## 2024-02-14 ENCOUNTER — Other Ambulatory Visit: Payer: Medicare PPO

## 2024-02-14 ENCOUNTER — Inpatient Hospital Stay: Payer: Medicare PPO | Admitting: Hematology and Oncology

## 2024-02-17 ENCOUNTER — Ambulatory Visit: Payer: Medicare PPO | Admitting: Podiatry

## 2024-02-21 ENCOUNTER — Telehealth: Payer: Self-pay

## 2024-02-21 ENCOUNTER — Inpatient Hospital Stay: Payer: Medicare PPO

## 2024-02-21 ENCOUNTER — Inpatient Hospital Stay: Payer: Medicare PPO | Admitting: Hematology and Oncology

## 2024-02-21 DIAGNOSIS — Z8673 Personal history of transient ischemic attack (TIA), and cerebral infarction without residual deficits: Secondary | ICD-10-CM

## 2024-02-21 DIAGNOSIS — R2681 Unsteadiness on feet: Secondary | ICD-10-CM

## 2024-02-21 DIAGNOSIS — R296 Repeated falls: Secondary | ICD-10-CM

## 2024-02-21 NOTE — Addendum Note (Signed)
Addended by: Doreene Nest on: 02/21/2024 12:09 PM   Modules accepted: Orders

## 2024-02-21 NOTE — Telephone Encounter (Signed)
Copied from CRM [id number]. Topic: Clinical - Medical Advice >> Feb 21, 2024 10:48 AM Elizebeth Brooking wrote: Reason for CRM: Patient daugther called in wanting to speak with Vernona Rieger about putting patient in inpatient rehab.   stated one day patient was able to walk a pull himself on walker, now he is having a hard time standing feels like he needs some rehab where he is at a place for that . She stated if Vernona Rieger has to have a appointment with him, it will need virtual as it is very difficult to get him out the house.  She is requesting for someone to give her a callback regarding this concern

## 2024-02-21 NOTE — Telephone Encounter (Signed)
Please call patient's daughter and let her know that I am going to get our social worker involved so that we can get him to the proper place.   They will be reaching out within the next few business days.

## 2024-02-21 NOTE — Telephone Encounter (Signed)
Called and spoke with patients daughter, reviewed all information.  She verbalized understanding.

## 2024-02-24 ENCOUNTER — Telehealth: Payer: Self-pay | Admitting: *Deleted

## 2024-02-24 NOTE — Progress Notes (Signed)
 Complex Care Management Note  Care Guide Note 02/24/2024 Name: [name] [name] MRN: [id number] DOB: [birth year]  [name] [name] is a 83 y.o. year old male who sees Doreene Nest, NP for primary care. I reached out to Alexia Freestone by phone today to offer complex care management services.  [name] was given information about Complex Care Management services today including:   The Complex Care Management services include support from the care team which includes your Nurse Care Manager, Clinical Social Worker, or Pharmacist.  The Complex Care Management team is here to help remove barriers to the health concerns and goals most important to you. Complex Care Management services are voluntary, and the patient may decline or stop services at any time by request to their care team member.   Complex Care Management Consent Status: Patient agreed to services and verbal consent obtained.   Follow up plan:  Telephone appointment with complex care management team member scheduled for:  02/26/2024  Encounter Outcome:  Patient Scheduled  Burman Nieves, CMA, Care Guide [facility]  [facility], [facility] Guide Direct Dial: [phone]  Fax: [phone] Website: [email or web address]

## 2024-02-25 ENCOUNTER — Ambulatory Visit: Payer: Self-pay | Admitting: *Deleted

## 2024-02-25 NOTE — Patient Outreach (Signed)
 Care Coordination   Initial Visit Note   02/25/2024 Name: [name] [name] MRN: [id number] DOB: Mar 15, 1941  [name] [name] is a 83 y.o. year old male who sees Doreene Nest, NP for primary care. I spoke with  Alexia Freestone by phone today.  What matters to the patients health and wellness today?  Placement in rehab following respite care stay   Goals Addressed             This Visit's Progress    care coordination activities       Activities and task to complete in order to accomplish goals.   LEVELS OF CARE TASK Patient to be assessed by Physical therapy through [facility] to obtain recommendations for care CSW will follow up with [facility]/PT regarding recommendations and will assist with placement options when indicated         SDOH assessments and interventions completed:  Yes  SDOH Interventions Today    Flowsheet Row Most Recent Value  SDOH Interventions   Food Insecurity Interventions Intervention Not Indicated  Housing Interventions Intervention Not Indicated  Transportation Interventions Intervention Not Indicated  Utilities Interventions Intervention Not Indicated        Care Coordination Interventions:  Yes, provided  Interventions Today    Flowsheet Row Most Recent Value  Chronic Disease   Chronic disease during today's visit Other  [CVA]  General Interventions   General Interventions Discussed/Reviewed General Interventions Discussed, Level of Care, Communication with  [patiet's daughter requesting assistance wtih placement in skilled level of care due to issues  with mobility-cannot stand/walk without 2 person assist. Pt currently in respite care at [facility] but will be discharged home tomorrow under hospice care]  Communication with --  [hospice social worker-tx team agreeable to have patient assessed by their physical therapist to provide treatment recommendation]  Level of Care Skilled Nursing Facility  [CSW will follow up with PT  regarding treatment recommendations.]  Education Interventions   Education Provided Provided Printed Education  Mental Health Interventions   Mental Health Discussed/Reviewed Other  [placement process for skilled nursing and authorization process]       Follow up plan: Follow up call scheduled for 03/10/24    Encounter Outcome:  Patient Visit Completed

## 2024-02-26 ENCOUNTER — Telehealth: Payer: Self-pay | Admitting: Primary Care

## 2024-02-26 NOTE — Telephone Encounter (Signed)
 Copied from CRM [id number]. Topic: General - Other >> Feb 26, 2024  4:21 PM Almira Coaster wrote: Reason for CRM: Verda Cumins, patient's daughter is calling regarding a form that needs to be filled out by Vernona Rieger, she would like to speak with Vernona Rieger or her nurse and states it's very important. She did not want to disclose the type of form. Best call back number [phone].

## 2024-02-26 NOTE — Patient Instructions (Signed)
 Visit Information  Thank you for taking time to visit with me today. Please don't hesitate to contact me if I can be of assistance to you.   Following are the goals we discussed today:   Goals Addressed             This Visit's Progress    care coordination activities       Activities and task to complete in order to accomplish goals.   LEVELS OF CARE TASK Patient to be assessed by Physical therapy through [facility] to obtain recommendations for care CSW will follow up with [facility]/PT regarding recommendations and will assist with placement options when indicated         Our next appointment is by telephone on 03/10/24 at 4pm  Please call the care guide team at [phone] if you need to cancel or reschedule your appointment.   If you are experiencing a Mental Health or Behavioral Health Crisis or need someone to talk to, please call 911   Patient verbalizes understanding of instructions and care plan provided today and agrees to view in MyChart. Active MyChart status and patient understanding of how to access instructions and care plan via MyChart confirmed with patient.     Telephone follow up appointment with care management team member scheduled for: 03/10/24  Verna Czech, LCSW [place]  [facility], [facility] Licensed Clinical Social Worker Care Coordinator  Direct Dial: [phone]

## 2024-02-26 NOTE — Progress Notes (Deleted)
 NEUROLOGY FOLLOW UP OFFICE NOTE  [name] [name] [id number]  Assessment/Plan:   Transient episode of confusion.  ED described aphasia but it really wasn't aphasia.  TIA is possible but uncertain as he did not clearly have lateralizing symptoms.  I do not suspect recrudescence of symptoms from prior stroke in absence of offending etiology such as UTI.  Seizure is possible but less likely.   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  Due to mobility issues and without ability to travel to outpatient PT, will request home PT. 3  Secondary stroke prevention as managed by PCP/cardiology:             - Plavix 75mg  daily alone             - Statin.  LDL goal less than 70             - Hgb A1c goal less than 7             - Normotensive blood pressure 4  From a neurologic standpoint in regards to the stroke, I see no reason to not drive.  However, due to mobility issues and slowed reflexes, cautioned not to drive at this time and to at least undergo formal driving evaluation first. 5  Follow up 6 months.   Total time spent reviewing chart and face to face with patient and family discussing diagnosis and plan:  35 minutes  History of Present Illness:  [name] [name] is an 83 year old left-handed male with HTN, HLD, DMII, CAD, carotid artery disease s/p R CEA and history of TIA who follows up for possible TIA.  She is accompanied by his wife and son-in-law who supplement history.   UPDATE: Current medications: Plavix 75mg  daily, ASA 81mg  daily, atorvastatin 10mg  daily, HCTZ, losartan   Receiving care from [facility] where he has been receiving PT/OT.  He has been having recurrent falls.  Now has a bedside commode.   ***    HISTORY: He was admitted to [facility] from 04/03/2021 to 04/06/2021 for transient left-sided numbness and weakness lasting an hour.  CT head was negative for follow up MRI of brain showed acute small right thalamic infarct with chronic small vessel ischemic changes and multiple chronic lacunar infarcts.  CTA of head and neck showed 70-80% left ICA stenosis.  2D echo showed EF 60-65% with no cardiac source of embolus.  Prior to admission, he was on ASA 81mg  daily.  He was discharged on ASA 81mg  and Plavix 75mg  daily for 3 weeks followed by Plavix alone.   Underwent carotid endarterectomy on 05/18/2021 for asymptomatic left ICA stenosis.   Patient was admitted to the hospital on 02/28/2022 for 2-3 days of left leg weakness, left facial weakness and slurred speech.   MRI of brain showed acute infarct within the right corona radiata.  CTA head and neck showed calcified atherosclerotic plaque in the intercranial ICAs causing mild-moderate stenosis on left and mild on right, left and right carotid endarterectomy with no residual significant stenosis or occlusion, and unchanged moderate right and mild left vertebral artery stenosis.  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  2D echo  showed LVEF 55-60% with Grade I diastolic dysfunction, trival MR, aortic valve sclerosis with no evidence of stenosis with no thrombus or atrial level shunt.  He was discharged on ASA and Plavix for 3 months followed by Plavix alone.   In April 2023, he had started having language difficulty.  He struggled to get words out.  It started to frustrate him.  No slurred speech, facial droop, or unilateral numbness or weakness.  Symptoms lasted 30 minutes.  He remembers some bits but doesn't remember most of the event. Today blood pressure has been 160-170s/70s-100.  Prior to today, then ave been in the 130s/60s.   He has an appointment with his PCP later today.  Denies palpitations.  EEG on 04/25/2022 showed mild generalized background slowing but no epileptiform discharges.  30 day cardiac event monitor in May-June did not reveal a fib.    On 08/21/2022, he had another episode of aphasia, waking up with difficulty putting words together and using incorrect words.  He kept talking about a battery to change for his hearing aid.  He felt confused.  He went to the bathroom and didn't remember why he was in the bathroom.  No headache, facial droop, unilateral numbness/weakness or other lateralizing symptoms.  Symptoms lasted about 2 hours.  Went to ED.  MRI of brain showed mild DWI hyperintensity in the right corona radiata with ADC correlate potentially indicating subacute infarct vs artifact (where he had a previous stroke) but no obvious acute stroke.    He did not have infection such as UTI.  It was proposed that he may have had recrudescence from prior stroke.      PAST MEDICAL HISTORY: Past Medical History:  Diagnosis Date   Aspiration pneumonia vs. CAP 02/28/2022   Basal cell carcinoma 11/09/2019   nod & infil-behind right ear-cx3 &exc   Basal cell carcinoma 03/21/2020   Residual BCC with peripheral margin involved - ST recommends MOHs   Carotid artery occlusion    Coronary artery disease    s/p CABG 2005; sees Dr Eden Emms yearly   Diabetes mellitus without complication (HCC)    Gynecomastia    History of COVID-19 01/08/2022   Hypercholesterolemia    Hypertension    Neurodermatitis    Overweight(278.02)    Personal history of colonic polyps 10/08/2006   tubular adenomas   Renal insufficiency    Stroke [facility])    TIA  April 2022   Transient ischemic attack 2010   "lasted ~ 5 seconds"   Vitamin D deficiency     MEDICATIONS: Current Outpatient Medications on File Prior to Visit  Medication Sig Dispense Refill   atorvastatin (LIPITOR) 10 MG tablet TAKE 1 TABLET BY MOUTH EVERY DAY FOR CHOLESTEROL  90 tablet 2   cholecalciferol (VITAMIN D3) 25 MCG (1000 UNIT) tablet Take 1,000 Units by mouth daily.     clopidogrel (PLAVIX) 75 MG tablet TAKE 1 TABLET BY MOUTH EVERY DAY 90 tablet 3   cyanocobalamin (VITAMIN B12) 500 MCG tablet Take 500 mcg by mouth daily.     glucose blood (ACCU-CHEK GUIDE) test strip Use as instructed 100 strip 3   No current facility-administered medications on file prior to visit.    ALLERGIES: Allergies  Allergen Reactions   Niacin Other (See Comments)    intol to NIACIN w/ headaches    Ativan [Lorazepam] Other (See Comments)    agitation    FAMILY HISTORY: Family History  Problem Relation Age of Onset   Heart disease Mother  Before age 28   Diabetes Mother    Kidney disease Mother    Heart attack Mother 21   Lung cancer Father 63   Diabetes Brother    Heart disease Brother    Heart disease Brother    Arthritis Brother    Diabetes Sister    Fibromyalgia Sister    Lung cancer Paternal Uncle        questionable as to if it was lung ca   Healthy Daughter    Colon cancer Neg Hx    Stroke Neg Hx       Objective:  *** General: No acute distress.  Patient appears well-groomed.   Head:  Normocephalic/atraumatic Eyes:  Fundi examined but not visualized Heart:  Regular rate and rhythm Neurological Exam: alert and oriented.  Speech fluent and not dysarthric, language intact.  CN II-XII intact. Bulk and tone normal.  Limited exam of left upper extremity due to rotator cuff tear.  Decreased dexterity of left hand.  4+/5 left hip flexion; otherwise, muscle strength 5/5 throughout.  Sensation to light touch intact.  Deep tendon reflexes 2+ throughout.  Finger to nose testing intact.  In wheelchair.  Requires upper strength to push self up to stand.  Cautious and unsteady wide-based gait.  Unable to ambulate without assistance.  Romberg with sway.  ***   Shon Millet, DO  CC: Vernona Rieger, NP

## 2024-02-27 NOTE — Telephone Encounter (Signed)
 Unable to reach patient. Left voicemail to return call to our office.

## 2024-02-27 NOTE — Telephone Encounter (Signed)
 Patients daughter called back in. The patient is under hospice care, however Jae Dire is still PCP. Patient daughter states with his wife having fractured her back and no longer able to help care for him, he now needs 24/7 care.  Reports patient has a life [employer] techn[employer] with a rider on it stating if he has a terminal illness or is on hospice care then he can take out 50% of the policy to help towards his care. Patient and daughter are hoping to get this approved so that they can get the patient around the clock care that he needs. To be approved, there is a form that needs to be sent to the insurance company. The form requires provider to document that the patient is of sound mind and provide an estimation of how much longer patient will live, in order to be approved this must stated 12 months or less. The patients daughter asked the insurance rep, for example if the document states he has 6 months to live but foes on to live 3 years that doesn't matter in regards to using the funds in policy. She is sending documents through fax.  If any questions or concerns patients daughter, [name] [name] asks you contact her at [phone]

## 2024-02-27 NOTE — Telephone Encounter (Signed)
 We will need a virtual visit to discuss.

## 2024-02-28 ENCOUNTER — Other Ambulatory Visit: Payer: Self-pay

## 2024-02-28 ENCOUNTER — Ambulatory Visit: Payer: Medicare PPO | Admitting: Neurology

## 2024-02-28 ENCOUNTER — Emergency Department (HOSPITAL_COMMUNITY): Payer: Medicare PPO

## 2024-02-28 ENCOUNTER — Encounter: Payer: Self-pay | Admitting: Primary Care

## 2024-02-28 ENCOUNTER — Encounter (HOSPITAL_COMMUNITY): Payer: Self-pay

## 2024-02-28 ENCOUNTER — Inpatient Hospital Stay (HOSPITAL_COMMUNITY)
Admission: EM | Admit: 2024-02-28 | Discharge: 2024-03-04 | DRG: 871 | Disposition: A | Discharge status: Skilled Nursing Facility | Payer: Medicare PPO | Attending: Internal Medicine | Admitting: Internal Medicine

## 2024-02-28 ENCOUNTER — Telehealth: Payer: Medicare PPO | Admitting: Primary Care

## 2024-02-28 DIAGNOSIS — Z888 Allergy status to other drugs, medicaments and biological substances status: Secondary | ICD-10-CM

## 2024-02-28 DIAGNOSIS — I5042 Chronic combined systolic (congestive) and diastolic (congestive) heart failure: Secondary | ICD-10-CM | POA: Insufficient documentation

## 2024-02-28 DIAGNOSIS — Z8616 Personal history of COVID-19: Secondary | ICD-10-CM

## 2024-02-28 DIAGNOSIS — Z85828 Personal history of other malignant neoplasm of skin: Secondary | ICD-10-CM

## 2024-02-28 DIAGNOSIS — A419 Sepsis, unspecified organism: Secondary | ICD-10-CM | POA: Diagnosis not present

## 2024-02-28 DIAGNOSIS — E860 Dehydration: Secondary | ICD-10-CM | POA: Diagnosis present

## 2024-02-28 DIAGNOSIS — R296 Repeated falls: Secondary | ICD-10-CM | POA: Diagnosis not present

## 2024-02-28 DIAGNOSIS — R531 Weakness: Secondary | ICD-10-CM | POA: Diagnosis not present

## 2024-02-28 DIAGNOSIS — Z1152 Encounter for screening for COVID-19: Secondary | ICD-10-CM

## 2024-02-28 DIAGNOSIS — R9389 Abnormal findings on diagnostic imaging of other specified body structures: Secondary | ICD-10-CM | POA: Diagnosis not present

## 2024-02-28 DIAGNOSIS — Z7902 Long term (current) use of antithrombotics/antiplatelets: Secondary | ICD-10-CM

## 2024-02-28 DIAGNOSIS — E1165 Type 2 diabetes mellitus with hyperglycemia: Secondary | ICD-10-CM | POA: Diagnosis present

## 2024-02-28 DIAGNOSIS — I110 Hypertensive heart disease with heart failure: Secondary | ICD-10-CM | POA: Diagnosis present

## 2024-02-28 DIAGNOSIS — I639 Cerebral infarction, unspecified: Secondary | ICD-10-CM | POA: Diagnosis not present

## 2024-02-28 DIAGNOSIS — N390 Urinary tract infection, site not specified: Secondary | ICD-10-CM | POA: Diagnosis not present

## 2024-02-28 DIAGNOSIS — E876 Hypokalemia: Secondary | ICD-10-CM | POA: Diagnosis present

## 2024-02-28 DIAGNOSIS — R509 Fever, unspecified: Secondary | ICD-10-CM | POA: Diagnosis not present

## 2024-02-28 DIAGNOSIS — R1310 Dysphagia, unspecified: Secondary | ICD-10-CM | POA: Diagnosis present

## 2024-02-28 DIAGNOSIS — Z7984 Long term (current) use of oral hypoglycemic drugs: Secondary | ICD-10-CM

## 2024-02-28 DIAGNOSIS — J189 Pneumonia, unspecified organism: Secondary | ICD-10-CM | POA: Diagnosis not present

## 2024-02-28 DIAGNOSIS — E7800 Pure hypercholesterolemia, unspecified: Secondary | ICD-10-CM | POA: Diagnosis present

## 2024-02-28 DIAGNOSIS — I959 Hypotension, unspecified: Secondary | ICD-10-CM | POA: Diagnosis not present

## 2024-02-28 DIAGNOSIS — D72829 Elevated white blood cell count, unspecified: Secondary | ICD-10-CM | POA: Diagnosis not present

## 2024-02-28 DIAGNOSIS — Z79899 Other long term (current) drug therapy: Secondary | ICD-10-CM

## 2024-02-28 DIAGNOSIS — Z8673 Personal history of transient ischemic attack (TIA), and cerebral infarction without residual deficits: Secondary | ICD-10-CM

## 2024-02-28 DIAGNOSIS — Z8249 Family history of ischemic heart disease and other diseases of the circulatory system: Secondary | ICD-10-CM

## 2024-02-28 DIAGNOSIS — L8990 Pressure ulcer of unspecified site, unspecified stage: Secondary | ICD-10-CM | POA: Insufficient documentation

## 2024-02-28 DIAGNOSIS — N179 Acute kidney failure, unspecified: Secondary | ICD-10-CM

## 2024-02-28 DIAGNOSIS — R471 Dysarthria and anarthria: Secondary | ICD-10-CM | POA: Insufficient documentation

## 2024-02-28 DIAGNOSIS — R7401 Elevation of levels of liver transaminase levels: Secondary | ICD-10-CM | POA: Diagnosis present

## 2024-02-28 DIAGNOSIS — Z87891 Personal history of nicotine dependence: Secondary | ICD-10-CM

## 2024-02-28 DIAGNOSIS — R2681 Unsteadiness on feet: Secondary | ICD-10-CM | POA: Diagnosis not present

## 2024-02-28 DIAGNOSIS — E872 Acidosis, unspecified: Secondary | ICD-10-CM | POA: Diagnosis present

## 2024-02-28 DIAGNOSIS — Z515 Encounter for palliative care: Secondary | ICD-10-CM

## 2024-02-28 DIAGNOSIS — E785 Hyperlipidemia, unspecified: Secondary | ICD-10-CM | POA: Diagnosis present

## 2024-02-28 DIAGNOSIS — R54 Age-related physical debility: Secondary | ICD-10-CM | POA: Diagnosis present

## 2024-02-28 DIAGNOSIS — I517 Cardiomegaly: Secondary | ICD-10-CM | POA: Diagnosis not present

## 2024-02-28 DIAGNOSIS — I2510 Atherosclerotic heart disease of native coronary artery without angina pectoris: Secondary | ICD-10-CM | POA: Diagnosis present

## 2024-02-28 DIAGNOSIS — N320 Bladder-neck obstruction: Secondary | ICD-10-CM | POA: Diagnosis present

## 2024-02-28 DIAGNOSIS — Z951 Presence of aortocoronary bypass graft: Secondary | ICD-10-CM

## 2024-02-28 DIAGNOSIS — Z8679 Personal history of other diseases of the circulatory system: Secondary | ICD-10-CM

## 2024-02-28 DIAGNOSIS — E119 Type 2 diabetes mellitus without complications: Secondary | ICD-10-CM

## 2024-02-28 DIAGNOSIS — G9341 Metabolic encephalopathy: Secondary | ICD-10-CM | POA: Diagnosis not present

## 2024-02-28 DIAGNOSIS — Z860100 Personal history of colon polyps, unspecified: Secondary | ICD-10-CM

## 2024-02-28 DIAGNOSIS — Z66 Do not resuscitate: Secondary | ICD-10-CM | POA: Diagnosis present

## 2024-02-28 DIAGNOSIS — Z833 Family history of diabetes mellitus: Secondary | ICD-10-CM

## 2024-02-28 DIAGNOSIS — L89321 Pressure ulcer of left buttock, stage 1: Secondary | ICD-10-CM | POA: Diagnosis present

## 2024-02-28 DIAGNOSIS — R338 Other retention of urine: Secondary | ICD-10-CM | POA: Insufficient documentation

## 2024-02-28 DIAGNOSIS — A0811 Acute gastroenteropathy due to Norwalk agent: Secondary | ICD-10-CM

## 2024-02-28 DIAGNOSIS — G459 Transient cerebral ischemic attack, unspecified: Secondary | ICD-10-CM | POA: Diagnosis not present

## 2024-02-28 LAB — CBC WITH DIFFERENTIAL/PLATELET
Abs Immature Granulocytes: 0.48 10*3/uL — ABNORMAL HIGH (ref 0.00–0.07)
Basophils Absolute: 0.1 10*3/uL (ref 0.0–0.1)
Basophils Relative: 0 %
Eosinophils Absolute: 0 10*3/uL (ref 0.0–0.5)
Eosinophils Relative: 0 %
HCT: 43.4 % (ref 39.0–52.0)
Hemoglobin: 15 g/dL (ref 13.0–17.0)
Immature Granulocytes: 2 %
Lymphocytes Relative: 3 %
Lymphs Abs: 0.9 10*3/uL (ref 0.7–4.0)
MCH: 35 pg — ABNORMAL HIGH (ref 26.0–34.0)
MCHC: 34.6 g/dL (ref 30.0–36.0)
MCV: 101.4 fL — ABNORMAL HIGH (ref 80.0–100.0)
Monocytes Absolute: 1.9 10*3/uL — ABNORMAL HIGH (ref 0.1–1.0)
Monocytes Relative: 7 %
Neutro Abs: 22.8 10*3/uL — ABNORMAL HIGH (ref 1.7–7.7)
Neutrophils Relative %: 88 %
Platelets: 370 10*3/uL (ref 150–400)
RBC: 4.28 MIL/uL (ref 4.22–5.81)
RDW: 12.8 % (ref 11.5–15.5)
Smear Review: NORMAL
WBC: 26.1 10*3/uL — ABNORMAL HIGH (ref 4.0–10.5)
nRBC: 0 % (ref 0.0–0.2)

## 2024-02-28 LAB — URINALYSIS, ROUTINE W REFLEX MICROSCOPIC
Bilirubin Urine: NEGATIVE
Glucose, UA: NEGATIVE mg/dL
Ketones, ur: NEGATIVE mg/dL
Leukocytes,Ua: NEGATIVE
Nitrite: NEGATIVE
Protein, ur: NEGATIVE mg/dL
Specific Gravity, Urine: 1.012 (ref 1.005–1.030)
pH: 5 (ref 5.0–8.0)

## 2024-02-28 LAB — BASIC METABOLIC PANEL
Anion gap: 14 (ref 5–15)
BUN: 15 mg/dL (ref 8–23)
CO2: 22 mmol/L (ref 22–32)
Calcium: 8.1 mg/dL — ABNORMAL LOW (ref 8.9–10.3)
Chloride: 102 mmol/L (ref 98–111)
Creatinine, Ser: 1.38 mg/dL — ABNORMAL HIGH (ref 0.61–1.24)
GFR, Estimated: 52 mL/min — ABNORMAL LOW (ref >60)
Glucose, Bld: 227 mg/dL — ABNORMAL HIGH (ref 70–99)
Potassium: 3.4 mmol/L — ABNORMAL LOW (ref 3.5–5.1)
Sodium: 138 mmol/L (ref 135–145)

## 2024-02-28 MED ORDER — LACTATED RINGERS IV BOLUS (SEPSIS)
1000.0000 mL | Freq: Once | INTRAVENOUS | Status: AC
  Administered 2024-02-29: 1000 mL via INTRAVENOUS

## 2024-02-28 MED ORDER — METRONIDAZOLE 500 MG/100ML IV SOLN
500.0000 mg | Freq: Once | INTRAVENOUS | Status: AC
  Administered 2024-02-29: 500 mg via INTRAVENOUS
  Filled 2024-02-28: qty 100

## 2024-02-28 MED ORDER — LACTATED RINGERS IV SOLN: INTRAVENOUS | Status: AC

## 2024-02-28 MED ORDER — VANCOMYCIN HCL IN DEXTROSE 1-5 GM/200ML-% IV SOLN: 1000.0000 mg | Freq: Once | INTRAVENOUS | Status: DC

## 2024-02-28 MED ORDER — VANCOMYCIN HCL 2000 MG/400ML IV SOLN
2000.0000 mg | Freq: Once | INTRAVENOUS | Status: AC
  Administered 2024-02-29: 2000 mg via INTRAVENOUS
  Filled 2024-02-28: qty 400

## 2024-02-28 MED ORDER — SODIUM CHLORIDE 0.9 % IV SOLN
2.0000 g | Freq: Once | INTRAVENOUS | Status: AC
  Administered 2024-02-28: 2 g via INTRAVENOUS
  Filled 2024-02-28: qty 12.5

## 2024-02-28 NOTE — ED Provider Notes (Signed)
 [place] [facility] DE[facility]MENT AT [facility] Provider Note   CSN: [id number] Arrival date & time: 02/28/24  2146     History  Chief Complaint  Patient presents with   Weakness    [name] [name] is a 83 y.o. male.  HPI      83yo male with history of hypertension, hyperlipidemia, diabetes, coronary artery disease, TIA, respiratory hemochromatosis, under hospice care who presents with concern for generalized weakness, fatigue, word finding difficulties.    Around 3PM he was staring, not as responsive, lasted about 30 minutes. Has episodes like this, gets tired and sleeps.  Could not form words, trouble following commands. Starts coming out of it. Reports these are his TIAs he gets and had episode today of word finding problem. No focal numbness/weakness with this.   Has had continued cough since February 7. No shortness of breath. No nausea, vomiting.   Previously he was walking with walker several steps to wheelchair, then had respiratory infection 2/7, went to respit care (as wife was having back surgery) and now not able to stand well on one since he was there.  Sleeping a lot more.  Appetite lower since then. Cough continuing.  He reported difficulty urinating today and hospice attempted to place Foley catheter but they were unsuccessful.  Hematology discussed that they would no longer pursue repeat phlebotomy, otherwise he is full code, does want full support, all medical support despite his hospice status.    Past Medical History:  Diagnosis Date   Aspiration pneumonia vs. CAP 02/28/2022   Basal cell carcinoma 11/09/2019   nod & infil-behind right ear-cx3 &exc   Basal cell carcinoma 03/21/2020   Residual BCC with peripheral margin involved - ST recommends MOHs   Carotid artery occlusion    Coronary artery disease    s/p CABG 2005; sees Dr Eden Emms yearly   Diabetes mellitus without complication (HCC)    Gynecomastia    History of COVID-19 01/08/2022    Hypercholesterolemia    Hypertension    Neurodermatitis    Overweight(278.02)    Personal history of colonic polyps 10/08/2006   tubular adenomas   Renal insufficiency    Stroke [facility])    TIA  April 2022   Transient ischemic attack 2010   "lasted ~ 5 seconds"   Vitamin D deficiency      Home Medications Prior to Admission medications   Medication Sig Start Date End Date Taking? Authorizing Provider  atorvastatin (LIPITOR) 10 MG tablet TAKE 1 TABLET BY MOUTH EVERY DAY FOR CHOLESTEROL 08/24/23   Doreene Nest, NP  cholecalciferol (VITAMIN D3) 25 MCG (1000 UNIT) tablet Take 1,000 Units by mouth daily.    [provider]  clopidogrel (PLAVIX) 75 MG tablet TAKE 1 TABLET BY MOUTH EVERY DAY 07/26/23   Doreene Nest, NP  cyanocobalamin (VITAMIN B12) 500 MCG tablet Take 500 mcg by mouth daily.    [provider]  furosemide (LASIX) 20 MG tablet Take 20 mg by mouth as needed.    [provider]  glucose blood (ACCU-CHEK GUIDE) test strip Use as instructed 10/11/23   Doreene Nest, NP      Allergies    Niacin and Ativan [lorazepam]    Review of Systems   Review of Systems  Physical Exam Updated Vital Signs BP 114/66   Pulse 96   Temp 98.8 F (37.1 C) (Oral)   Resp 16   Ht 6' (1.829 m)   Wt 94.8 kg Comment: Wt  from 11/2023  SpO2 96%   BMI 28.34 kg/m  Physical Exam Vitals and nursing note reviewed.  Constitutional:      General: He is not in acute distress.    Appearance: He is well-developed. He is not diaphoretic.  HENT:     Head: Normocephalic and atraumatic.  Eyes:     Conjunctiva/sclera: Conjunctivae normal.  Cardiovascular:     Rate and Rhythm: Normal rate and regular rhythm.     Heart sounds: Normal heart sounds. No murmur heard.    No friction rub. No gallop.  Pulmonary:     Effort: Pulmonary effort is normal. No respiratory distress.     Breath sounds: Normal breath sounds. No wheezing or rales.  Abdominal:     General:  There is no distension.     Palpations: Abdomen is soft.     Tenderness: There is no abdominal tenderness. There is no guarding.  Musculoskeletal:     Cervical back: Normal range of motion.  Skin:    General: Skin is warm and dry.  Neurological:     Mental Status: He is alert.     Comments: Oriented to self, location, reports "2005" for year  Speech is slow but answering questions appropriately     ED Results / Procedures / Treatments   Labs (all labs ordered are listed, but only abnormal results are displayed) Labs Reviewed  CBC WITH DIFFERENTIAL/PLATELET - Abnormal; Notable for the following components:      Result Value   WBC 26.1 (*)    MCV 101.4 (*)    MCH 35.0 (*)    Neutro Abs 22.8 (*)    Monocytes Absolute 1.9 (*)    Abs Immature Granulocytes 0.48 (*)    All other components within normal limits  BASIC METABOLIC PANEL - Abnormal; Notable for the following components:   Potassium 3.4 (*)    Glucose, Bld 227 (*)    Creatinine, Ser 1.38 (*)    Calcium 8.1 (*)    GFR, Estimated 52 (*)    All other components within normal limits  URINALYSIS, ROUTINE W REFLEX MICROSCOPIC - Abnormal; Notable for the following components:   Hgb urine dipstick SMALL (*)    Bacteria, UA MANY (*)    All other components within normal limits  HEPATIC FUNCTION PANEL - Abnormal; Notable for the following components:   Total Protein 5.2 (*)    Albumin 2.0 (*)    Alkaline Phosphatase 233 (*)    All other components within normal limits  LACTIC ACID, PLASMA - Abnormal; Notable for the following components:   Lactic Acid, Venous 3.1 (*)    All other components within normal limits  I-STAT CG4 LACTIC ACID, ED - Abnormal; Notable for the following components:   Lactic Acid, Venous 2.9 (*)    All other components within normal limits  RESP PANEL BY RT-PCR (RSV, FLU A&B, COVID)  RVPGX2  CULTURE, BLOOD (ROUTINE X 2)  CULTURE, BLOOD (ROUTINE X 2)  PROTIME-INR  APTT  URINALYSIS, W/ REFLEX TO  CULTURE (INFECTION SUSPECTED)  COMPREHENSIVE METABOLIC PANEL  CBC  LACTIC ACID, PLASMA  LACTIC ACID, PLASMA    EKG EKG Interpretation Date/Time:  Friday February 28 2024 22:52:28 EST Ventricular Rate:  90 PR Interval:  174 QRS Duration:  95 QT Interval:  400 QTC Calculation: 490 R Axis:   83  Text Interpretation: Sinus rhythm Borderline right axis deviation Nonspecific repol abnormality, diffuse leads No significant change since last tracing Confirmed by Alvira Monday ([id number])  on 02/28/2024 11:55:13 PM  Radiology DG Chest Port 1 View Result Date: 02/29/2024 CLINICAL DATA:  Weakness EXAM: PORTABLE CHEST 1 VIEW COMPARISON:  10/13/2023 FINDINGS: Post sternotomy changes. Enlarged cardiomediastinal silhouette. Elevation of the right diaphragm. No acute airspace disease, pleural effusion or pneumothorax IMPRESSION: No active disease.  Cardiomegaly Electronically Signed   By: Jasmine Pang M.D.   On: 02/29/2024 00:05    Procedures Procedures    Medications Ordered in ED Medications  lactated ringers infusion ( Intravenous New Bag/Given 02/29/24 0043)  vancomycin (VANCOREADY) IVPB 2000 mg/400 mL (2,000 mg Intravenous New Bag/Given 02/29/24 0042)  atorvastatin (LIPITOR) tablet 10 mg (has no administration in time range)  clopidogrel (PLAVIX) tablet 75 mg (has no administration in time range)  sodium chloride flush (NS) 0.9 % injection 3 mL (has no administration in time range)  sodium chloride flush (NS) 0.9 % injection 3 mL (has no administration in time range)  0.9 %  sodium chloride infusion (has no administration in time range)  acetaminophen (TYLENOL) tablet 650 mg (has no administration in time range)    Or  acetaminophen (TYLENOL) suppository 650 mg (has no administration in time range)  ondansetron (ZOFRAN) tablet 4 mg (has no administration in time range)    Or  ondansetron (ZOFRAN) injection 4 mg (has no administration in time range)  ceFEPIme (MAXIPIME) 2 g in sodium chloride  0.9 % 100 mL IVPB (has no administration in time range)  vancomycin (VANCOREADY) IVPB 750 mg/150 mL (has no administration in time range)  lactated ringers bolus 1,000 mL (has no administration in time range)  lactated ringers bolus 1,000 mL (0 mLs Intravenous Stopped 02/29/24 0134)  ceFEPIme (MAXIPIME) 2 g in sodium chloride 0.9 % 100 mL IVPB (0 g Intravenous Stopped 02/29/24 0020)  metroNIDAZOLE (FLAGYL) IVPB 500 mg (0 mg Intravenous Stopped 02/29/24 0135)  LORazepam (ATIVAN) injection 0.5 mg (0.5 mg Intravenous Given 02/29/24 0205)    ED Course/ Medical Decision Making/ A&P                                  83yo male with history of hypertension, hyperlipidemia, diabetes, coronary artery disease, TIA, respiratory hemochromatosis, under hospice care who presents with concern for generalized weakness, fatigue, episode of staring and word finding difficulties.  Per EMS, he had elevated temperature and low blood pressures and was given Tylenol and fluid around.  His temperature is normal on arrival to the emergency department.  Plan to clarify goals in setting of hospice care.    Differential diagnosis includes anemia, electrolyte abnormality, sepsis or infection, CVA medication changes, other metabolic abnormalities.  Chest x-ray was completed and showed no evidence of pneumonia or pulmonary edema.  Urinalysis is concerning for possible urinary tract infection with many bacteria, although does have 0-5 white blood cells.  Lab work completed personally by interpreted by me show an acute kidney injury with a creatinine of 1.38 from 0.9 previously, mild hypokalemia and hyperglycemia.  White blood cells elevated to 26,000.  Lactic acid 2.9.    Clarified his goals of care with daughter who reports that he is full code with no alteration of goals to care with him on hospice.  Ordered empiric antibiotics.  At this time suspect infection secondary to UTI.  We also discussed his episode of word finding  difficulty which she reports he has had in the past.  Discussed this may represent TIA, or might be stroke  recrudescence in the setting of acute illness.  After discussion, we will proceed with MRI to evaluate for signs of new CVA in setting of symptoms today, as well as overall functional decline since he was initially placed in respite care a weeks ago. Will admit for continued care.          Final Clinical Impression(s) / ED Diagnoses Final diagnoses:  Generalized weakness  Leukocytosis, unspecified type  AKI (acute kidney injury) (HCC)  Urinary tract infection without hematuria, site unspecified    Rx / DC Orders ED Discharge Orders     None         Alvira Monday, MD 02/29/24 0230

## 2024-02-28 NOTE — Patient Instructions (Signed)
 I will complete the paperwork as requested.  It was a pleasure to see you today!

## 2024-02-28 NOTE — Progress Notes (Signed)
 Patient ID: [name] [name], male    DOB: 1941-05-07, 83 y.o.   MRN: [id number]  Virtual visit completed through [employer]ility, a video enabled telemedicine application. Due to national recommendations of social distancing due to COVID-19, a virtual visit is felt to be most appropriate for this patient at this time. Reviewed limitations, risks, security and privacy concerns of performing a virtual visit and the availability of in person appointments. I also reviewed that there may be a patient responsible charge related to this service. The patient agreed to proceed.   Patient location: home Provider location: [place] at [facility], office Persons participating in this virtual visit: patient, provider, caregiver, daughter  If any vitals were documented, they were collected by patient at home unless specified below.    There were no vitals taken for this visit.   CC: Hospice Evaluation and paperwork Subjective:   HPI: [name] [name] is a 83 y.o. male with a significant medical history including hypertension, recurrent strokes, TIA, type 2 diabetes, carotid artery disease, hyperlipidemia, unsteady gait, recurrent falls, hereditary hemochromatosis presenting on 02/28/2024 for Paperwork Misty Stanley caregiver is at the house with patient,/Michelle, patients daughter is on the video call. )  He is needing paperwork completed by his life insurance policy so that his family can use the finances to help with his health care.  He is currently on hospice since December 2024 for his overall decline in health. His daughter discusses that he's having two TIAs per month. During his episodes his symptoms include disorientation, fatigue, left sided facial drooping, left sided weakness. These symptoms will last 2-10 minutes but takes hours for him to return to baseline.  His neurologist is aware.   He had a bad respiratory illness one month ago, and since then he's become increasingly weak and fatigued. Now is he  is mostly chair and bed bound. He requires help with all ADL's. He cannot hold up his body weight. He received a hospital bed today. His appetite has decreased moderately over the last 3 weeks. His Hospice nurse has been coming to the house weekly to assess.  He has a caregiver with him during the day who attests to his significant decline.      Relevant past medical, surgical, family and social history reviewed and updated as indicated. Interim medical history since our last visit reviewed. Allergies and medications reviewed and updated. Outpatient Medications Prior to Visit  Medication Sig Dispense Refill   atorvastatin (LIPITOR) 10 MG tablet TAKE 1 TABLET BY MOUTH EVERY DAY FOR CHOLESTEROL 90 tablet 2   cholecalciferol (VITAMIN D3) 25 MCG (1000 UNIT) tablet Take 1,000 Units by mouth daily.     clopidogrel (PLAVIX) 75 MG tablet TAKE 1 TABLET BY MOUTH EVERY DAY 90 tablet 3   cyanocobalamin (VITAMIN B12) 500 MCG tablet Take 500 mcg by mouth daily.     furosemide (LASIX) 20 MG tablet Take 20 mg by mouth as needed.     glucose blood (ACCU-CHEK GUIDE) test strip Use as instructed 100 strip 3   No facility-administered medications prior to visit.     Per HPI unless specifically indicated in ROS section below Review of Systems Objective:  There were no vitals taken for this visit.  Wt Readings from Last 3 Encounters:  11/22/23 209 lb (94.8 kg)  11/19/23 204 lb (92.5 kg)  10/22/23 208 lb 6.4 oz (94.5 kg)       Physical exam: General: Awake but drowsy, not answering questions. Does not acknowledge me  during visit.   Pulmonary: Non verbal. No respiratory distress.  No cough.  Neuro: Nonverbal, does not follow commands, noncontributory to HPI.     Results for orders placed or performed in visit on 02/07/24  Ferritin   Collection Time: 02/07/24 10:17 AM  Result Value Ref Range   Ferritin 199 24 - 336 ng/mL  CMP (Cancer Center only)   Collection Time: 02/07/24 10:17 AM  Result Value  Ref Range   Sodium 140 135 - 145 mmol/L   Potassium 3.3 (L) 3.5 - 5.1 mmol/L   Chloride 103 98 - 111 mmol/L   CO2 31 22 - 32 mmol/L   Glucose, Bld 177 (H) 70 - 99 mg/dL   BUN 15 8 - 23 mg/dL   Creatinine 1.61 0.96 - 1.24 mg/dL   Calcium 8.9 8.9 - 04.5 mg/dL   Total Protein 7.1 6.5 - 8.1 g/dL   Albumin 3.9 3.5 - 5.0 g/dL   AST 16 15 - 41 U/L   ALT 12 0 - 44 U/L   Alkaline Phosphatase 104 38 - 126 U/L   Total Bilirubin 0.5 0.0 - 1.2 mg/dL   GFR, Estimated >40 >98 mL/min   Anion gap 6 5 - 15  CBC with Differential (Cancer Center Only)   Collection Time: 02/07/24 10:17 AM  Result Value Ref Range   WBC Count 7.8 4.0 - 10.5 K/uL   RBC 4.56 4.22 - 5.81 MIL/uL   Hemoglobin 15.7 13.0 - 17.0 g/dL   HCT 11.9 14.7 - 82.9 %   MCV 100.4 (H) 80.0 - 100.0 fL   MCH 34.4 (H) 26.0 - 34.0 pg   MCHC 34.3 30.0 - 36.0 g/dL   RDW 56.2 13.0 - 86.5 %   Platelet Count 222 150 - 400 K/uL   nRBC 0.0 0.0 - 0.2 %   Neutrophils Relative % 73 %   Neutro Abs 5.8 1.7 - 7.7 K/uL   Lymphocytes Relative 15 %   Lymphs Abs 1.1 0.7 - 4.0 K/uL   Monocytes Relative 7 %   Monocytes Absolute 0.5 0.1 - 1.0 K/uL   Eosinophils Relative 3 %   Eosinophils Absolute 0.2 0.0 - 0.5 K/uL   Basophils Relative 1 %   Basophils Absolute 0.1 0.0 - 0.1 K/uL   Immature Granulocytes 1 %   Abs Immature Granulocytes 0.04 0.00 - 0.07 K/uL   Assessment & Plan:   Problem List Items Addressed This Visit       Cardiovascular and Mediastinum   Recurrent strokes (HCC) - Primary   Exam today with significant decline compared to last visit. Agree that hospice is the appropriate avenue for remainder of his life. Agree that he has less than 12 months remaining in his life.  Will complete paperwork for life insurance policy as requested.      Relevant Medications   furosemide (LASIX) 20 MG tablet     No orders of the defined types were placed in this encounter.  No orders of the defined types were placed in this encounter.   I  discussed the assessment and treatment plan with the patient. The patient was provided an opportunity to ask questions and all were answered. The patient agreed with the plan and demonstrated an understanding of the instructions. The patient was advised to call back or seek an in-person evaluation if the symptoms worsen or if the condition fails to improve as anticipated.  Follow up plan:  I will complete the paperwork as requested.  It was a  pleasure to see you today!   Doreene Nest, NP

## 2024-02-28 NOTE — ED Notes (Signed)
 Pt's dtr, Abu Heavin, is POA and wishes to be contacted regarding care. She is sick and unable to be here. Contact # [phone]

## 2024-02-28 NOTE — Telephone Encounter (Signed)
 Called and scheduled patient virtual appt at 3:20pm today to discuss.

## 2024-02-28 NOTE — ED Triage Notes (Signed)
 Pt BIB [facility] for generalized weakness and fatigue. Pt comes from home and is hospice pt for "cardiac history". Pt was febrile and hypotensive PTA with [facility]. Pt has been given NS and Tylenol 650mg  po by EMS. PT is hard of hearing but otherwise alert and oriented x 4. Pt comes with condom cath placed by Hospice caregiver because foley was unsuccessful. Pt reports urinary retention "for awhile".

## 2024-02-28 NOTE — Assessment & Plan Note (Signed)
 Exam today with significant decline compared to last visit. Agree that hospice is the appropriate avenue for remainder of his life. Agree that he has less than 12 months remaining in his life.  Will complete paperwork for life insurance policy as requested.

## 2024-02-29 ENCOUNTER — Inpatient Hospital Stay (HOSPITAL_COMMUNITY)

## 2024-02-29 ENCOUNTER — Encounter (HOSPITAL_COMMUNITY): Payer: Self-pay | Admitting: Internal Medicine

## 2024-02-29 DIAGNOSIS — E860 Dehydration: Secondary | ICD-10-CM | POA: Diagnosis present

## 2024-02-29 DIAGNOSIS — N179 Acute kidney failure, unspecified: Secondary | ICD-10-CM

## 2024-02-29 DIAGNOSIS — E872 Acidosis, unspecified: Secondary | ICD-10-CM | POA: Diagnosis present

## 2024-02-29 DIAGNOSIS — R7401 Elevation of levels of liver transaminase levels: Secondary | ICD-10-CM | POA: Diagnosis present

## 2024-02-29 DIAGNOSIS — I2510 Atherosclerotic heart disease of native coronary artery without angina pectoris: Secondary | ICD-10-CM | POA: Diagnosis present

## 2024-02-29 DIAGNOSIS — R54 Age-related physical debility: Secondary | ICD-10-CM | POA: Diagnosis present

## 2024-02-29 DIAGNOSIS — E1165 Type 2 diabetes mellitus with hyperglycemia: Secondary | ICD-10-CM | POA: Diagnosis present

## 2024-02-29 DIAGNOSIS — R531 Weakness: Secondary | ICD-10-CM | POA: Diagnosis present

## 2024-02-29 DIAGNOSIS — Z1152 Encounter for screening for COVID-19: Secondary | ICD-10-CM | POA: Diagnosis not present

## 2024-02-29 DIAGNOSIS — E7800 Pure hypercholesterolemia, unspecified: Secondary | ICD-10-CM | POA: Diagnosis present

## 2024-02-29 DIAGNOSIS — N390 Urinary tract infection, site not specified: Secondary | ICD-10-CM | POA: Diagnosis present

## 2024-02-29 DIAGNOSIS — I5042 Chronic combined systolic (congestive) and diastolic (congestive) heart failure: Secondary | ICD-10-CM | POA: Diagnosis present

## 2024-02-29 DIAGNOSIS — E7849 Other hyperlipidemia: Secondary | ICD-10-CM

## 2024-02-29 DIAGNOSIS — R471 Dysarthria and anarthria: Secondary | ICD-10-CM | POA: Diagnosis not present

## 2024-02-29 DIAGNOSIS — Z8616 Personal history of COVID-19: Secondary | ICD-10-CM | POA: Diagnosis not present

## 2024-02-29 DIAGNOSIS — Z8679 Personal history of other diseases of the circulatory system: Secondary | ICD-10-CM

## 2024-02-29 DIAGNOSIS — R9089 Other abnormal findings on diagnostic imaging of central nervous system: Secondary | ICD-10-CM | POA: Diagnosis not present

## 2024-02-29 DIAGNOSIS — R1310 Dysphagia, unspecified: Secondary | ICD-10-CM | POA: Diagnosis present

## 2024-02-29 DIAGNOSIS — Z8673 Personal history of transient ischemic attack (TIA), and cerebral infarction without residual deficits: Secondary | ICD-10-CM

## 2024-02-29 DIAGNOSIS — R338 Other retention of urine: Secondary | ICD-10-CM | POA: Diagnosis not present

## 2024-02-29 DIAGNOSIS — L89321 Pressure ulcer of left buttock, stage 1: Secondary | ICD-10-CM | POA: Diagnosis present

## 2024-02-29 DIAGNOSIS — I611 Nontraumatic intracerebral hemorrhage in hemisphere, cortical: Secondary | ICD-10-CM | POA: Diagnosis not present

## 2024-02-29 DIAGNOSIS — E876 Hypokalemia: Secondary | ICD-10-CM | POA: Diagnosis present

## 2024-02-29 DIAGNOSIS — Z7401 Bed confinement status: Secondary | ICD-10-CM | POA: Diagnosis not present

## 2024-02-29 DIAGNOSIS — I6782 Cerebral ischemia: Secondary | ICD-10-CM | POA: Diagnosis not present

## 2024-02-29 DIAGNOSIS — A419 Sepsis, unspecified organism: Secondary | ICD-10-CM | POA: Diagnosis present

## 2024-02-29 DIAGNOSIS — Z515 Encounter for palliative care: Secondary | ICD-10-CM | POA: Diagnosis not present

## 2024-02-29 DIAGNOSIS — E119 Type 2 diabetes mellitus without complications: Secondary | ICD-10-CM | POA: Diagnosis not present

## 2024-02-29 DIAGNOSIS — I110 Hypertensive heart disease with heart failure: Secondary | ICD-10-CM | POA: Diagnosis present

## 2024-02-29 DIAGNOSIS — D72829 Elevated white blood cell count, unspecified: Secondary | ICD-10-CM | POA: Diagnosis not present

## 2024-02-29 DIAGNOSIS — Z66 Do not resuscitate: Secondary | ICD-10-CM | POA: Diagnosis present

## 2024-02-29 DIAGNOSIS — A0811 Acute gastroenteropathy due to Norwalk agent: Secondary | ICD-10-CM | POA: Diagnosis present

## 2024-02-29 DIAGNOSIS — I10 Essential (primary) hypertension: Secondary | ICD-10-CM | POA: Diagnosis not present

## 2024-02-29 DIAGNOSIS — G9341 Metabolic encephalopathy: Secondary | ICD-10-CM | POA: Diagnosis not present

## 2024-02-29 DIAGNOSIS — Z7189 Other specified counseling: Secondary | ICD-10-CM | POA: Diagnosis not present

## 2024-02-29 LAB — URINALYSIS, W/ REFLEX TO CULTURE (INFECTION SUSPECTED)
Bilirubin Urine: NEGATIVE
Glucose, UA: NEGATIVE mg/dL
Ketones, ur: NEGATIVE mg/dL
Nitrite: NEGATIVE
Protein, ur: NEGATIVE mg/dL
Specific Gravity, Urine: 1.016 (ref 1.005–1.030)
WBC, UA: >50 WBC/hpf (ref 0–5)
pH: 6 (ref 5.0–8.0)

## 2024-02-29 LAB — COMPREHENSIVE METABOLIC PANEL
ALT: 40 U/L (ref 0–44)
AST: 47 U/L — ABNORMAL HIGH (ref 15–41)
Albumin: 2.5 g/dL — ABNORMAL LOW (ref 3.5–5.0)
Alkaline Phosphatase: 271 U/L — ABNORMAL HIGH (ref 38–126)
Anion gap: 12 (ref 5–15)
BUN: 16 mg/dL (ref 8–23)
CO2: 23 mmol/L (ref 22–32)
Calcium: 7.9 mg/dL — ABNORMAL LOW (ref 8.9–10.3)
Chloride: 103 mmol/L (ref 98–111)
Creatinine, Ser: 1.23 mg/dL (ref 0.61–1.24)
GFR, Estimated: 59 mL/min — ABNORMAL LOW (ref >60)
Glucose, Bld: 138 mg/dL — ABNORMAL HIGH (ref 70–99)
Potassium: 3.5 mmol/L (ref 3.5–5.1)
Sodium: 138 mmol/L (ref 135–145)
Total Bilirubin: 1 mg/dL (ref 0.0–1.2)
Total Protein: 6.1 g/dL — ABNORMAL LOW (ref 6.5–8.1)

## 2024-02-29 LAB — HEPATIC FUNCTION PANEL
ALT: 35 U/L (ref 0–44)
AST: 41 U/L (ref 15–41)
Albumin: 2 g/dL — ABNORMAL LOW (ref 3.5–5.0)
Alkaline Phosphatase: 233 U/L — ABNORMAL HIGH (ref 38–126)
Bilirubin, Direct: 0.2 mg/dL (ref 0.0–0.2)
Indirect Bilirubin: 0.4 mg/dL (ref 0.3–0.9)
Total Bilirubin: 0.6 mg/dL (ref 0.0–1.2)
Total Protein: 5.2 g/dL — ABNORMAL LOW (ref 6.5–8.1)

## 2024-02-29 LAB — RESP PANEL BY RT-PCR (RSV, FLU A&B, COVID)  RVPGX2
Influenza A by PCR: NEGATIVE
Influenza B by PCR: NEGATIVE
Resp Syncytial Virus by PCR: NEGATIVE
SARS Coronavirus 2 by RT PCR: NEGATIVE

## 2024-02-29 LAB — CBC
HCT: 44.9 % (ref 39.0–52.0)
Hemoglobin: 14.8 g/dL (ref 13.0–17.0)
MCH: 34.3 pg — ABNORMAL HIGH (ref 26.0–34.0)
MCHC: 33 g/dL (ref 30.0–36.0)
MCV: 103.9 fL — ABNORMAL HIGH (ref 80.0–100.0)
Platelets: 351 10*3/uL (ref 150–400)
RBC: 4.32 MIL/uL (ref 4.22–5.81)
RDW: 13 % (ref 11.5–15.5)
WBC: 24.5 10*3/uL — ABNORMAL HIGH (ref 4.0–10.5)
nRBC: 0 % (ref 0.0–0.2)

## 2024-02-29 LAB — I-STAT CG4 LACTIC ACID, ED: Lactic Acid, Venous: 2.9 mmol/L (ref 0.5–1.9)

## 2024-02-29 LAB — PROTIME-INR
INR: 1.1 (ref 0.8–1.2)
Prothrombin Time: 14.8 s (ref 11.4–15.2)

## 2024-02-29 LAB — C DIFFICILE QUICK SCREEN W PCR REFLEX
C Diff antigen: NEGATIVE
C Diff interpretation: NOT DETECTED
C Diff toxin: NEGATIVE

## 2024-02-29 LAB — LACTIC ACID, PLASMA
Lactic Acid, Venous: 3.1 mmol/L (ref 0.5–1.9)
Lactic Acid, Venous: 3.7 mmol/L (ref 0.5–1.9)
Lactic Acid, Venous: 3 mmol/L (ref 0.5–1.9)

## 2024-02-29 LAB — HEPATITIS PANEL, ACUTE
HCV Ab: NONREACTIVE
Hep A IgM: NONREACTIVE
Hep B C IgM: NONREACTIVE
Hepatitis B Surface Ag: NONREACTIVE

## 2024-02-29 LAB — GLUCOSE, CAPILLARY
Glucose-Capillary: 153 mg/dL — ABNORMAL HIGH (ref 70–99)
Glucose-Capillary: 156 mg/dL — ABNORMAL HIGH (ref 70–99)
Glucose-Capillary: 157 mg/dL — ABNORMAL HIGH (ref 70–99)

## 2024-02-29 LAB — APTT: aPTT: 30 s (ref 24–36)

## 2024-02-29 MED ORDER — ONDANSETRON HCL 4 MG PO TABS: 4.0000 mg | ORAL_TABLET | Freq: Four times a day (QID) | ORAL | Status: DC | PRN

## 2024-02-29 MED ORDER — ATORVASTATIN CALCIUM 10 MG PO TABS
10.0000 mg | ORAL_TABLET | Freq: Every day | ORAL | Status: DC
  Administered 2024-03-01 – 2024-03-04 (×4): 10 mg via ORAL
  Filled 2024-02-29 (×5): qty 1

## 2024-02-29 MED ORDER — VANCOMYCIN HCL 750 MG/150ML IV SOLN
750.0000 mg | Freq: Two times a day (BID) | INTRAVENOUS | Status: DC
  Administered 2024-02-29 – 2024-03-01 (×2): 750 mg via INTRAVENOUS
  Filled 2024-02-29 (×4): qty 150

## 2024-02-29 MED ORDER — SODIUM CHLORIDE 0.9% FLUSH
3.0000 mL | Freq: Two times a day (BID) | INTRAVENOUS | Status: DC
  Administered 2024-02-29 – 2024-03-04 (×7): 3 mL via INTRAVENOUS

## 2024-02-29 MED ORDER — ACETAMINOPHEN 10 MG/ML IV SOLN
1000.0000 mg | Freq: Four times a day (QID) | INTRAVENOUS | Status: AC
  Administered 2024-02-29 – 2024-03-01 (×4): 1000 mg via INTRAVENOUS
  Filled 2024-02-29 (×4): qty 100

## 2024-02-29 MED ORDER — DEXTROSE 50 % IV SOLN: 25.0000 g | INTRAVENOUS | Status: DC | PRN

## 2024-02-29 MED ORDER — ACETAMINOPHEN 650 MG RE SUPP: 650.0000 mg | Freq: Four times a day (QID) | RECTAL | Status: DC | PRN

## 2024-02-29 MED ORDER — POTASSIUM CHLORIDE CRYS ER 20 MEQ PO TBCR
20.0000 meq | EXTENDED_RELEASE_TABLET | Freq: Once | ORAL | Status: AC
  Administered 2024-02-29: 20 meq via ORAL
  Filled 2024-02-29: qty 1

## 2024-02-29 MED ORDER — ACETAMINOPHEN 325 MG PO TABS: 650.0000 mg | ORAL_TABLET | Freq: Four times a day (QID) | ORAL | Status: DC | PRN

## 2024-02-29 MED ORDER — SODIUM CHLORIDE 0.9 % IV SOLN: 250.0000 mL | INTRAVENOUS | Status: AC | PRN

## 2024-02-29 MED ORDER — SODIUM CHLORIDE 0.9% FLUSH: 3.0000 mL | Freq: Two times a day (BID) | INTRAVENOUS | Status: DC

## 2024-02-29 MED ORDER — LACTATED RINGERS IV BOLUS
1000.0000 mL | Freq: Once | INTRAVENOUS | Status: AC
  Administered 2024-02-29: 1000 mL via INTRAVENOUS

## 2024-02-29 MED ORDER — ACETAMINOPHEN 650 MG RE SUPP
650.0000 mg | Freq: Four times a day (QID) | RECTAL | Status: DC | PRN
  Administered 2024-02-29: 650 mg via RECTAL
  Filled 2024-02-29: qty 1

## 2024-02-29 MED ORDER — ACETAMINOPHEN 325 MG PO TABS
650.0000 mg | ORAL_TABLET | Freq: Four times a day (QID) | ORAL | Status: DC | PRN
  Filled 2024-02-29: qty 2

## 2024-02-29 MED ORDER — ONDANSETRON HCL 4 MG/2ML IJ SOLN: 4.0000 mg | Freq: Four times a day (QID) | INTRAMUSCULAR | Status: DC | PRN

## 2024-02-29 MED ORDER — CLOPIDOGREL BISULFATE 75 MG PO TABS
75.0000 mg | ORAL_TABLET | Freq: Every day | ORAL | Status: DC
  Administered 2024-02-29 – 2024-03-04 (×5): 75 mg via ORAL
  Filled 2024-02-29 (×5): qty 1

## 2024-02-29 MED ORDER — SODIUM CHLORIDE 0.9 % IV SOLN
2.0000 g | Freq: Two times a day (BID) | INTRAVENOUS | Status: DC
  Administered 2024-02-29 (×2): 2 g via INTRAVENOUS
  Filled 2024-02-29 (×2): qty 12.5

## 2024-02-29 MED ORDER — LORAZEPAM 2 MG/ML IJ SOLN
0.5000 mg | Freq: Once | INTRAMUSCULAR | Status: AC
  Administered 2024-02-29: 0.5 mg via INTRAVENOUS
  Filled 2024-02-29: qty 1

## 2024-02-29 MED ORDER — ENOXAPARIN SODIUM 40 MG/0.4ML IJ SOSY
40.0000 mg | PREFILLED_SYRINGE | INTRAMUSCULAR | Status: DC
  Administered 2024-02-29 – 2024-03-04 (×5): 40 mg via SUBCUTANEOUS
  Filled 2024-02-29 (×5): qty 0.4

## 2024-02-29 MED ORDER — SODIUM CHLORIDE 0.9% FLUSH: 3.0000 mL | INTRAVENOUS | Status: DC | PRN

## 2024-02-29 NOTE — Progress Notes (Signed)
 [name] ED Room 004 [facility] Hospitalized Hospice Patient Visit   [name] is a current hospice patient with a terminal diagnosis of  Cerebrovascular Disease. Family contacted EMS to assist with getting patient from chair to bed. Upon arrival, EMS noted that patient had a low-grade fever, dark cloudy urine, tachycardic and was hypotensive. Family reported that patient had been weak, not eating or drinking all day. [facility] notified of patient condition and family requested evaluation at hospital. Patient was admitted to [facility] on 02/29/24 with diagnosis of Sepsis. Per Dr. Gordy Savers with [facility], this is a related hospital admission.   Visited patient at bedside. No family present at that time. Patient appeared to be fidgety and restless. Was not able to hold conversation with me, appeared to be confused. Patient had foley cath in place. Currently receiving IV antibiotics.   Patient is GIP appropriate for IV antibiotics needed to treat his sepsis.   Vital Signs:  98.8/76/17     106/58   96% on RA  I&O 1435.2/ none documented at this time  Abnormal labs 02/29/24 03:38 Glucose: 138 (H) Calcium: 7.9 (L) Alkaline Phosphatase: 271 (H) Albumin: 2.5 (L) AST: 47 (H) Total Protein: 6.1 (L) GFR, Estimated: 59 (L) Lactic Acid, Venous: 3.7 (HH) WBC: 24.5 (H) MCV: 103.9 (H) MCH: 34.3 (H)    Diagnostics:    2.28.25 DG CHEST PORT 1 VIEW  FINDINGS: Post sternotomy changes. Enlarged cardiomediastinal silhouette. Elevation of the right diaphragm. No acute airspace disease, pleural effusion or pneumothorax   IMPRESSION: No active disease.  Cardiomegaly    3.1.25 Blood Cultures x2 Pending  3.1.25 Urinalysis, w/ Reflex to Culture (Infection Suspected) -Urine, Clean Catch Appearance: CLOUDY ! Bilirubin Urine: NEGATIVE Color, Urine: AMBER ! Glucose, UA: NEGATIVE Hgb urine dipstick: SMALL ! Ketones, ur: NEGATIVE Leukocytes,Ua: LARGE  ! Nitrite: NEGATIVE pH: 6.0 Protein: NEGATIVE Specific Gravity, Urine: 1.016 Specimen Source: URINE, CLEAN CATCH Bacteria, UA: RARE ! Ca Oxalate Crys, UA: PRESENT Hyaline Casts, UA: PRESENT Mucus: PRESENT RBC / HPF: 0-5 Squamous Epithelial / HPF: 0-5 WBC Clumps: PRESENT WBC, UA: >50 URINE CULTURE: Rpt (IP)  MR BRAIN WO CONTRAST Date: 02/29/2024  MPRESSION: 1. Motion degraded exam. 2. No acute intracranial abnormality. 3. Age-related cerebral atrophy with moderate to advanced chronic microvascular ischemic disease, with multiple remote lacunar infarcts about the bilateral basal ganglia, thalami, and pons.  IV/PRN Meds Ativan 0.5mg  IV x1, Ofirmev 1000mg  IV x1, Cefepime 2g IV x2, Metronidazole 500mg  IV x1, Vancomycin 2000mg  IV x1  Problem list as per MD note Tereasa Coop, MD 3.01.2025 Sepsis-unknown source of infection at this time -Patient presented to emergency department with multiple complaints include generalized weakness, urinary tension, poor appetite and dysarthria for last 2 to 3 days since patient's daughter has a norovirus infection at home.  Patient's daughter reported patient had an URI 1 month ago.  Patient denies any chest pain, shortness of breath, cough, headache, nausea, vomiting, abdominal pain, and diarrhea. - Presentation to ED patient is hemodynamically stable.  However found to have significant leukocytosis 26.1 and elevated lactic acid 2.9.  Normal respiratory panel.  Blood culture results are pending. - UA positive with bacteria but no leukocyte esterase and no nitrate. - Chest x-ray no active disease process.  Cardiomegaly. - Lactic acid continue to trend up to 3.1. - In the ED patient has been given 1 L of LR bolus, vancomycin, cefepime and metronidazole. - At this point unknown source of infection.  However given patient  has significant leukocytosis and lactic acid continue to trend concern for early development of sepsis. - Giving total 2 L of LR bolus  and continue LR 150 cc/h.  Continue to trend lactic acid. -Continue empiric treatment with IV vancomycin and cefepime. - Will follow-up with blood cultures result. Addendum: Patient's lactic acid continue to trending up  3.7.  It is seems like that patient also has some decreased hepatic clearance of the lactic acid in the setting of transaminitis which is also contributing to persistently elevated lactic acid as well. Giving total 3 L of LR bolus and currently on LR 150 cc/h. Patient already has been on broad-spectrum coverage with IV Vanco and cefepime.     Acute urinary tension -Patient reported urinary tension and unable to void.  At home health patient's home and nurse unable to place the Foley catheter and condom catheter. - In the ED Foley catheter has been placed and having 300 cc of urine output immediately.  Obtained UA which did not show any evidence of infection.   Postrenal AKI -Elevated creatinine 1.38.  Baseline normal GFR and renal function. - Postrenal acute kidney injury setting of bladder outlet obstruction. - Foley catheter has been placed.  UA unremarkable for evidence of UTI. - Continue monitor renal function.  Avoid nephrotoxic agents.   Dysarthria -Patient presents emergency department as family complaining about he is having dysarthria for last 2 to 3 days.  History of previous TIA.  Currently on Plavix. - Physical exam unremarkable for any upper or lower extremity weakness, sensory deficient and focal neurological sign. -MRI no evidence of acute intracranial abnormality.  Age-related cerebral atrophy with moderate to advanced chronic microvascular ischemic disease.  Remote lacunar infraction.     Transaminitis - Elevated ALP at 233 and AST 47.  Normal bilirubin level.  Patient denies any right upper quadrant and midepigastric abdominal pain. -Transaminitis likely secondary to dehydration and underlying history of hereditary hemochromatosis. -Checking hepatitis  panel.     Non-insulin-dependent DM type II -History of non-insulin-dependent DM type II diet controlled. - Continue heart healthy carb modified diet.   Hyperlipidemia -Continue Lipitor   History of TIA -Continue Lipitor and Plavix.   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     History of chronic combined systolic diastolic heart failure -At home patient is not any blood pressure regimen given patient is on hospice care.  Patient follows outpatient cardiology.   History of CAD -Continue Lipitor and Plavix.  Discharge Planning: Ongoing.   Family contact: Spoke with daughter/HCPOA, [name] via phone (family recovering from stomach bug). No other concerns at this time.   IDT:  Updated     Goals of Care- Full Code at this time. Daughter is hopeful that he will improve enough for her to have a conversation with him to potentially change status.  Palliative care consulted.   Should patient need ambulance transfer at discharge- please use [facility]) as they contract this service for our active hospice patients.    Roe Rutherford, BSN, RN  Hospice Nurse Liaison  [phone]

## 2024-02-29 NOTE — Progress Notes (Signed)
 Pharmacy Antibiotic Note  [name] [name] is a 83 y.o. male admitted on 02/28/2024 with fatigue/urinary retention and concerns for sepsis.  Pharmacy has been consulted for cefepime/vancomycin dosing. PMH: patient is hospice care.  -Received cefepime/vanc/flagyl in ED -WBC 26, sCr 1.38, lactate 2.9, afebrile in hospital (reported fever prior to arrival) -No clear source for infection  Plan: -Cefepime 2g IV every 12 hours -Vancomycin 2g IV x1 -Vancomycin 750mg  IV every 12 hours (AUC 507, Vd 0.72, IBW) -Monitor renal function -Follow up signs of clinical improvement, LOT, de-escalation of antibiotics   Height: 6' (182.9 cm) Weight: 94.8 kg (208 lb 15.9 oz) (Wt from 11/2023) IBW/kg (Calculated) : 77.6  Temp (24hrs), Avg:98.8 F (37.1 C), Min:98.8 F (37.1 C), Max:98.8 F (37.1 C)  Recent Labs  Lab 02/28/24 2214 02/29/24 0044  WBC 26.1*  --   CREATININE 1.38*  --   LATICACIDVEN  --  2.9*    Estimated Creatinine Clearance: 51 mL/min (A) (by C-G formula based on SCr of 1.38 mg/dL (H)).    Allergies  Allergen Reactions   Niacin Other (See Comments)    intol to NIACIN w/ headaches    Ativan [Lorazepam] Other (See Comments)    agitation    Antimicrobials this admission: Cefepime 3/1 >>  Vancomycin 3/1 >>   Microbiology results: 3/1 BCx:   Thank you for allowing pharmacy to be a part of this patient's care.  Arabella Merles, PharmD. Clinical Pharmacist 02/29/2024 2:03 AM

## 2024-02-29 NOTE — Progress Notes (Signed)
 SLP Cancellation Note  Patient Details Name: DANTRELL SCHERTZER MRN: [id number] DOB: 07/04/1941   Cancelled treatment:       Reason Eval/Treat Not Completed: Patient's level of consciousness;Other (comment) (poor mentation per nursing report)   Ardyth Gal MA, CCC-SLP Acute Rehabilitation Services   02/29/2024, 1:36 PM

## 2024-02-29 NOTE — Progress Notes (Signed)
 Patient's lactic acid continue to trending up  3.7.  It is seems like that patient also has some decreased hepatic clearance of the lactic acid in the setting of transaminitis which is also contributing to persistently elevated lactic acid as well. Giving total 3 L of LR bolus and currently on LR 150 cc/h. Patient already has been on broad-spectrum coverage with IV Vanco and cefepime.   Tereasa Coop, MD [facility] 02/29/2024, 6:30 AM

## 2024-02-29 NOTE — Sepsis Progress Note (Signed)
 Blood cultures were drawn prior to antibiotic administration. Verified with bedside nurse.

## 2024-02-29 NOTE — Progress Notes (Addendum)
 Elevated lactic acid 3.1.  Giving another 1 L of LR bolus.  Inactivating code sepsis.  Continue to trend lactic acid.  Continue maintenance fluid and broad-spectrum antibiotic coverage.

## 2024-02-29 NOTE — Progress Notes (Signed)
 Progress Note   Patient: [name] [name]:[id number] DOB: [birth year] DOA: 02/28/2024     0 DOS: the patient was seen and examined on 02/29/2024   Brief hospital course: 83 year old man with PMH of HFpEF, T2DM, HLD, HTN, CAD s/p CABG, TIA, hereditary hemochromatosis, currently on hospice care for cardiac issues who presented to the ED with complaints of fatigue, urinary retention and found to be febrile and hypotensive.  Patient was admitted and started on broad-spectrum antibiotics.  Patient developed diarrhea.  Assessment and Plan:  Diarrhea Likely source of infection. Patient noted to be having large volume loose stools. Family reports history of "stomach bug" among family members. -GI pathogen panel, C. difficile testing.  Sepsis, present on admission Evidenced by lactic acidosis and transient hypotension in the setting of infection. Unclear infectious source at present, bowel diarrhea unlikely. UA negative for leukocyte esterase and nitrates, CXR with no active disease process. -Will continue to treat with broad-spectrum antibiotics pending further test results. -Follow-up cultures -Continue IV fluids  Acute urinary retention Patient reportedly unable to void on presentation. Foley catheter was placed in the ED. -Maintain Foley catheter.  Postrenal AKI Patient presented with creatinine of 1.38 from baseline of approximately 0.9. Likely due to outflow obstruction. -Will need evaluation for BPH.  Dysarthria Dysphagia History of TIA Patient with history of TIA and presenting with dysarthria, dysphagia. MRI of the brain showed no acute changes.  Patient has age-related cerebral atrophy with moderate to advanced chronic microvascular ischemic disease and evidence of remote lacunar infarction. -SLP consulted. -Keep n.p.o. until SLP evaluation -Anticipate neurological signs will improve with improvement in his medical condition.  T2DM Patient not on any diabetic meds at  home. -Will order diabetic diet once patient able to tolerate p.o. intake.  HLD -Continue home atorvastatin  Hereditary hemochromatosis -Outpatient follow-up  HFpEF CAD -Holding home Lasix.  Hospice patient Seen by hospice team, noted that patient is being cared for by [facility] (formerly known as [facility] [address]). [Please call [phone] for [place] or [phone] for [facility] to receive additional information on this patient's care wishes.] Of note, patient is full code. -Will continue with current treatment and continue to address goals of care.  Subjective: Patient lethargic and not responding verbally.   Physical Exam: Vitals:   02/29/24 1000 02/29/24 1100 02/29/24 1300 02/29/24 1400  BP: (!) 197/99 (!) 179/85 (!) 106/58 108/62  Pulse: (!) 124 (!) 124 76 74  Resp:  (!) 22 17 18   Temp:  (!) 101 F (38.3 C) 98.8 F (37.1 C) 98.5 F (36.9 C)  TempSrc:  Rectal Axillary Axillary  SpO2: 97% 97% 96% 94%  Weight:      Height:         General: Lethargic Eyes: Pupils equal, reactive  Oral cavity: moist mucous membranes  Head: Atraumatic, normocephalic  Neck: supple  Chest: clear to auscultation. No crackles, no wheezes  CVS: S1,S2 RRR. No murmurs  Abd: No distention, soft, non-tender. No masses palpable  Extr: Pedal edema   MSK: No joint deformities or swelling  Neurological: Grossly intact.    Data Reviewed:  CBC    Component Value Date/Time   WBC 24.5 (H) 02/29/2024 0338   RBC 4.32 02/29/2024 0338   HGB 14.8 02/29/2024 0338   HGB 15.7 02/07/2024 1017   HGB 14.8 10/28/2023 1300   HCT 44.9 02/29/2024 0338   HCT 44.7 10/28/2023 1300   PLT 351 02/29/2024 0338   PLT 222 02/07/2024 1017  PLT 296 10/28/2023 1300   MCV 103.9 (H) 02/29/2024 0338   MCV 106 (H) 10/28/2023 1300   MCH 34.3 (H) 02/29/2024 0338   MCHC 33.0 02/29/2024 0338   RDW 13.0 02/29/2024 0338   RDW 12.8 10/28/2023 1300   LYMPHSABS 0.9 02/28/2024  2214   LYMPHSABS 1.6 10/28/2023 1300   MONOABS 1.9 (H) 02/28/2024 2214   EOSABS 0.0 02/28/2024 2214   EOSABS 0.2 10/28/2023 1300   BASOSABS 0.1 02/28/2024 2214   BASOSABS 0.1 10/28/2023 1300      Latest Ref Rng & Units 02/29/2024    3:38 AM 02/28/2024   10:14 PM 02/07/2024   10:17 AM  BMP  Glucose 70 - 99 mg/dL 063  016  010   BUN 8 - 23 mg/dL 16  15  15    Creatinine 0.61 - 1.24 mg/dL 9.32  3.55  7.32   Sodium 135 - 145 mmol/L 138  138  140   Potassium 3.5 - 5.1 mmol/L 3.5  3.4  3.3   Chloride 98 - 111 mmol/L 103  102  103   CO2 22 - 32 mmol/L 23  22  31    Calcium 8.9 - 10.3 mg/dL 7.9  8.1  8.9    Family Communication: n/a  Disposition: Status is: Inpatient Remains inpatient appropriate because: Unable to take PO, currently NPO, sepsis with IV antibiotics.   Planned Discharge Destination: Home DVT ppx: SQ lovenox     Time spent: 50 minutes  Author: MDALA-GAUSI, Gwenette Greet, MD 02/29/2024 2:15 PM  For on call review [email or web address].

## 2024-02-29 NOTE — ED Notes (Signed)
 Patient cleansed of large amounts of watery brown stool multiple times so far this shift. Unable to give rectal tylenol due to continuous diarrhea. Provider informed. IV tylenol to be ordered and flexa seal drainage system.

## 2024-02-29 NOTE — Sepsis Progress Note (Signed)
 Following for sepsis monitoring ?

## 2024-02-29 NOTE — H&P (Addendum)
 History and Physical    [name] [name] ZOX:[id number] DOB: Feb 06, 1941 DOA: 02/28/2024  PCP: Doreene Nest, NP   Patient coming from: Home   Chief Complaint:  Chief Complaint  Patient presents with   Weakness   ED TRIAGE note:Pt BIB [facility] for generalized weakness and fatigue. Pt comes from home and is hospice pt for "cardiac history". Pt was febrile and hypotensive PTA with Ems. Pt has been given NS and Tylenol 650mg  po by EMS. PT is hard of hearing but otherwise alert and oriented x 4. Pt comes with condom cath placed by Hospice caregiver because foley was unsuccessful. Pt reports urinary retention "for awhile".   HPI:  [name] [name] is a 83 y.o. 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  Patient has been presented to emergency department complaining of fatigue and urinary retention.  Home hospice nurse unable to place the condom catheter as well as Foley catheter.  Patient is a very poor historian.  Patient's daughter reported that patient has some upper respiratory tract viral infection February 7th since then patient has been declining.  Having poor appetite.  Patient is also having some on and off dysarthria for last few days and family is concerning for a he is having any TIA like before.  Patient is also unable to void.  Denies any urinary symptoms.  Denies any fever and chill.  Denies any chest pain, shortness of breath, palpitation or extremity swelling.  Patient is Full code however at the same time he is on home hospice.  ED Course:  At presentation to ED patient is hemodynamically stable. Found to have elevated lactic acid 2.9. Blood cultures are in process Hepatic  function panel showing low albumin 2.  Elevated ALP 233. Normal APTT INR. Respiratory panel pending. UA showed dipstick hemoglobin positive and many bacteria. BMP showing low potassium 3.4, elevated blood glucose 227, elevated creatinine 1.38. CT showing leukocytosis 26 stable H&H and platelet count 270.  Pending MRI of the brain.  Chest x-ray no active disease process.  EKG showing normal sinus rhythm heart rate 90.  There is no ST anterior abnormality.  Due to elevated lactic acid in the ED patient has been given 1 L of LR bolus and currently on LR 150 cc/h.  Patient also fluty has been started treating with cefepime, vancomycin and metronidazole.  Unknown source of infection at this time.    Significant labs in the ED: Lab Orders         Resp panel by RT-PCR (RSV, Flu A&B, Covid) Anterior Nasal Swab         Blood Culture (routine x 2)         CBC with Differential         Basic metabolic panel         Urinalysis, Routine w reflex microscopic -Urine, Catheterized         Protime-INR         APTT         Urinalysis, w/ Reflex to Culture (Infection Suspected) -Urine, Clean Catch         Hepatic function panel         Comprehensive metabolic panel         CBC  Lactic acid, plasma         Hepatitis panel, acute       Review of Systems:  Review of Systems  Constitutional:  Negative for chills, fever, malaise/fatigue and weight loss.  HENT:  Positive for hearing loss. Negative for ear pain and tinnitus.   Eyes:  Negative for blurred vision.  Respiratory:  Negative for cough, sputum production and shortness of breath.   Cardiovascular:  Negative for chest pain, palpitations and orthopnea.  Gastrointestinal:  Negative for abdominal pain, diarrhea, heartburn, nausea and vomiting.  Genitourinary:  Negative for dysuria, flank pain, frequency, hematuria and urgency.  Musculoskeletal:  Negative for back pain.  Neurological:  Negative for dizziness and headaches.   Psychiatric/Behavioral:  The patient is not nervous/anxious.   All other systems reviewed and are negative.   Past Medical History:  Diagnosis Date   Aspiration pneumonia vs. CAP 02/28/2022   Basal cell carcinoma 11/09/2019   nod & infil-behind right ear-cx3 &exc   Basal cell carcinoma 03/21/2020   Residual BCC with peripheral margin involved - ST recommends MOHs   Carotid artery occlusion    Coronary artery disease    s/p CABG 2005; sees Dr Eden Emms yearly   Diabetes mellitus without complication (HCC)    Gynecomastia    History of COVID-19 01/08/2022   Hypercholesterolemia    Hypertension    Neurodermatitis    Overweight(278.02)    Personal history of colonic polyps 10/08/2006   tubular adenomas   Renal insufficiency    Stroke [facility])    TIA  April 2022   Transient ischemic attack 2010   "lasted ~ 5 seconds"   Vitamin D deficiency     Past Surgical History:  Procedure Laterality Date   CARDIAC CATHETERIZATION  02/2004   "tried to stent; couldn't"   CAROTID ENDARTERECTOMY Right 08/26/2015   COLONOSCOPY     CORONARY ANGIOPLASTY     CORONARY ARTERY BYPASS GRAFT  Feb. 2005   4 vessel   ENDARTERECTOMY Right 08/26/2015   Procedure: Right Carotid ENDARTERECTOMY with Patch Angioplasty ;  Surgeon: Larina Earthly, MD;  Location: [facility] OR;  Service: Vascular;  Laterality: Right;   ENDARTERECTOMY Left 05/18/2021   Procedure: LEFT CAROTID ARTERY ENDARTERECTOMY  with patch angioplasty;  Surgeon: Larina Earthly, MD;  Location: [facility] OR;  Service: Vascular;  Laterality: Left;   EYE SURGERY     bilateral cataract   KELOID EXCISION  04/2008   on chest scar; Dr. Stephens November   KELOID EXCISION     PILONIDAL CYST EXCISION  1989     reports that he has never smoked. He has quit using smokeless tobacco. He reports that he does not drink alcohol and does not use drugs.  Allergies  Allergen Reactions   Niacin Other (See Comments)    intol to NIACIN w/ headaches    Ativan [Lorazepam] Other (See  Comments)    agitation    Family History  Problem Relation Age of Onset   Heart disease Mother        Before age 31   Diabetes Mother    Kidney disease Mother    Heart attack Mother 15   Lung cancer Father 2   Diabetes Brother    Heart disease Brother    Heart disease Brother    Arthritis Brother    Diabetes Sister    Fibromyalgia Sister    Lung cancer Paternal Uncle        questionable as to if it was lung  ca   Healthy Daughter    Colon cancer Neg Hx    Stroke Neg Hx     Prior to Admission medications   Medication Sig Start Date End Date Taking? Authorizing Provider  atorvastatin (LIPITOR) 10 MG tablet TAKE 1 TABLET BY MOUTH EVERY DAY FOR CHOLESTEROL 08/24/23   Doreene Nest, NP  cholecalciferol (VITAMIN D3) 25 MCG (1000 UNIT) tablet Take 1,000 Units by mouth daily.    [provider]  clopidogrel (PLAVIX) 75 MG tablet TAKE 1 TABLET BY MOUTH EVERY DAY 07/26/23   Doreene Nest, NP  cyanocobalamin (VITAMIN B12) 500 MCG tablet Take 500 mcg by mouth daily.    [provider]  furosemide (LASIX) 20 MG tablet Take 20 mg by mouth as needed.    [provider]  glucose blood (ACCU-CHEK GUIDE) test strip Use as instructed 10/11/23   Doreene Nest, NP     Physical Exam: Vitals:   02/28/24 2200 02/28/24 2202 02/28/24 2300 02/29/24 0314  BP: 114/66   132/72  Pulse: 96   86  Resp: 16   19  Temp: 98.8 F (37.1 C)   97.8 F (36.6 C)  TempSrc: Oral   Oral  SpO2: 95% 96%  97%  Weight:   94.8 kg   Height:   6' (1.829 m)     Physical Exam Vitals and nursing note reviewed.  Constitutional:      Appearance: He is not ill-appearing.  HENT:     Mouth/Throat:     Mouth: Mucous membranes are moist.  Eyes:     Pupils: Pupils are equal, round, and reactive to light.  Cardiovascular:     Rate and Rhythm: Normal rate and regular rhythm.     Pulses: Normal pulses.     Heart sounds: Normal heart sounds.  Pulmonary:     Effort: Pulmonary  effort is normal.     Breath sounds: Normal breath sounds.  Abdominal:     General: Bowel sounds are normal.     Tenderness: There is no abdominal tenderness. There is no guarding.  Musculoskeletal:     Cervical back: Neck supple.     Right lower leg: No edema.     Left lower leg: No edema.  Skin:    General: Skin is dry.     Coloration: Skin is not jaundiced or pale.     Findings: No bruising, erythema, lesion or rash.  Neurological:     Mental Status: He is alert and oriented to person, place, and time.  Psychiatric:        Mood and Affect: Mood normal.        Thought Content: Thought content normal.      Labs on Admission: I have personally reviewed following labs and imaging studies  CBC: Recent Labs  Lab 02/28/24 2214 02/29/24 0338  WBC 26.1* 24.5*  NEUTROABS 22.8*  --   HGB 15.0 14.8  HCT 43.4 44.9  MCV 101.4* 103.9*  PLT 370 351   Basic Metabolic Panel: Recent Labs  Lab 02/28/24 2214 02/29/24 0338  NA 138 138  K 3.4* 3.5  CL 102 103  CO2 22 23  GLUCOSE 227* 138*  BUN 15 16  CREATININE 1.38* 1.23  CALCIUM 8.1* 7.9*   GFR: Estimated Creatinine Clearance: 57.2 mL/min (by C-G formula based on SCr of 1.23 mg/dL). Liver Function Tests: Recent Labs  Lab 02/29/24 0014 02/29/24 0338  AST 41 47*  ALT 35 40  ALKPHOS 233* 271*  BILITOT 0.6 1.0  PROT 5.2* 6.1*  ALBUMIN 2.0* 2.5*   No results for input(s): "LIPASE", "AMYLASE" in the last 168 hours. No results for input(s): "AMMONIA" in the last 168 hours. Coagulation Profile: Recent Labs  Lab 02/29/24 0014  INR 1.1   Cardiac Enzymes: No results for input(s): "CKTOTAL", "CKMB", "CKMBINDEX", "TROPONINI", "TROPONINIHS" in the last 168 hours. BNP (last 3 results) Recent Labs    10/13/23 1733  BNP 233.6*   HbA1C: No results for input(s): "HGBA1C" in the last 72 hours. CBG: No results for input(s): "GLUCAP" in the last 168 hours. Lipid Profile: No results for input(s): "CHOL", "HDL", "LDLCALC",  "TRIG", "CHOLHDL", "LDLDIRECT" in the last 72 hours. Thyroid Function Tests: No results for input(s): "TSH", "T4TOTAL", "FREET4", "T3FREE", "THYROIDAB" in the last 72 hours. Anemia Panel: No results for input(s): "VITAMINB12", "FOLATE", "FERRITIN", "TIBC", "IRON", "RETICCTPCT" in the last 72 hours. Urine analysis:    Component Value Date/Time   COLORURINE YELLOW 02/28/2024 2214   APPEARANCEUR CLEAR 02/28/2024 2214   LABSPEC 1.012 02/28/2024 2214   PHURINE 5.0 02/28/2024 2214   GLUCOSEU NEGATIVE 02/28/2024 2214   GLUCOSEU 100 mg/dL (A) 46/96/2952 8413   HGBUR SMALL (A) 02/28/2024 2214   BILIRUBINUR NEGATIVE 02/28/2024 2214   KETONESUR NEGATIVE 02/28/2024 2214   PROTEINUR NEGATIVE 02/28/2024 2214   UROBILINOGEN 0.2 08/18/2015 0905   NITRITE NEGATIVE 02/28/2024 2214   LEUKOCYTESUR NEGATIVE 02/28/2024 2214    Radiological Exams on Admission: I have personally reviewed images MR BRAIN WO CONTRAST Result Date: 02/29/2024 CLINICAL DATA:  Initial evaluation for acute TIA. EXAM: MRI HEAD WITHOUT CONTRAST TECHNIQUE: Multiplanar, multiecho pulse sequences of the brain and surrounding structures were obtained without intravenous contrast. COMPARISON:  MRI from 08/21/2022 FINDINGS: Brain: Examination moderately degraded by motion artifact. Generalized age-related cerebral atrophy. Patchy and confluent T2/FLAIR hyperintensity involving the periventricular deep white matter both cerebral hemispheres as well as the pons, consistent with chronic small vessel ischemic disease, moderate to advanced in nature. Multiple remote lacunar infarcts present about the bilateral basal ganglia, thalami, and pons. No abnormal foci of restricted diffusion to suggest acute or subacute ischemia. Gray-white matter differentiation maintained. No areas of chronic cortical infarction. No acute intracranial hemorrhage. 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 Few  small chronic micro hemorrhages noted within the right cerebral hemisphere. No mass lesion, midline shift or mass effect. Mild ventricular prominence related to global parenchymal volume loss without hydrocephalus. No extra-axial fluid collection. Pituitary gland within normal limits. Vascular: Major intracranial vascular flow voids are grossly maintained on this motion degraded exam. The right vertebral artery is not well seen. Skull and upper cervical spine: Craniocervical junction within normal limits. Bone marrow signal intensity grossly normal. No scalp soft tissue abnormality. Sinuses/Orbits: Prior bilateral ocular lens replacement. Scattered mucosal thickening noted about the maxillary sinuses. Paranasal sinuses are otherwise largely clear. No significant mastoid effusion. Other: None. IMPRESSION: 1. Motion degraded exam. 2. No acute intracranial abnormality. 3. Age-related cerebral atrophy with moderate to advanced chronic microvascular ischemic disease, with multiple remote lacunar infarcts about the bilateral basal ganglia, thalami, and pons. Electronically Signed   By: Rise Mu M.D.   On: 02/29/2024 03:42   DG Chest Port 1 View Result Date: 02/29/2024 CLINICAL DATA:  Weakness EXAM: PORTABLE CHEST 1 VIEW COMPARISON:  10/13/2023 FINDINGS: Post sternotomy changes. Enlarged cardiomediastinal silhouette. Elevation of the right diaphragm. No acute airspace disease, pleural effusion or pneumothorax IMPRESSION: No active disease.  Cardiomegaly Electronically Signed  By: Jasmine Pang M.D.   On: 02/29/2024 00:05     EKG: My personal interpretation of EKG shows: EKG showing normal sinus rhythm heart rate 90.  There is no ST and T wave  abnormality.   Assessment/Plan: Principal Problem:   Sepsis (HCC) Active Problems:   Acute urinary retention   AKI (acute kidney injury) (HCC)   Non-insulin dependent type 2 diabetes mellitus (HCC)   Hyperlipidemia   History of TIA (transient ischemic  attack)   Hereditary hemochromatosis (HCC)   Dysarthria   Chronic combined systolic and diastolic CHF, NYHA class 1 (HCC)   History of CAD (coronary artery disease)    Assessment and Plan: Sepsis-unknown source of infection at this time -Patient presented to emergency department with multiple complaints include generalized weakness, urinary tension, poor appetite and dysarthria for last 2 to 3 days since patient's daughter has a norovirus infection at home.  Patient's daughter reported patient had an URI 1 month ago.  Patient denies any chest pain, shortness of breath, cough, headache, nausea, vomiting, abdominal pain, and diarrhea. - Presentation to ED patient is hemodynamically stable.  However found to have significant leukocytosis 26.1 and elevated lactic acid 2.9.  Normal respiratory panel.  Blood culture results are pending. - UA positive with bacteria but no leukocyte esterase and no nitrate. - Chest x-ray no active disease process.  Cardiomegaly. - Lactic acid continue to trend up to 3.1. - In the ED patient has been given 1 L of LR bolus, vancomycin, cefepime and metronidazole. - At this point unknown source of infection.  However given patient has significant leukocytosis and lactic acid continue to trend concern for early development of sepsis. - Giving total 2 L of LR bolus and continue LR 150 cc/h.  Continue to trend lactic acid. -Continue empiric treatment with IV vancomycin and cefepime. - Will follow-up with blood cultures result. Addendum: Patient's lactic acid continue to trending up  3.7.  It is seems like that patient also has some decreased hepatic clearance of the lactic acid in the setting of transaminitis which is also contributing to persistently elevated lactic acid as well. Giving total 3 L of LR bolus and currently on LR 150 cc/h. Patient already has been on broad-spectrum coverage with IV Vanco and cefepime.   Acute urinary tension -Patient reported urinary  tension and unable to void.  At home health patient's home and nurse unable to place the Foley catheter and condom catheter. - In the ED Foley catheter has been placed and having 300 cc of urine output immediately.  Obtained UA which did not show any evidence of infection.  Postrenal AKI -Elevated creatinine 1.38.  Baseline normal GFR and renal function. - Postrenal acute kidney injury setting of bladder outlet obstruction. - Foley catheter has been placed.  UA unremarkable for evidence of UTI. - Continue monitor renal function.  Avoid nephrotoxic agents.  Dysarthria -Patient presents emergency department as family complaining about he is having dysarthria for last 2 to 3 days.  History of previous TIA.  Currently on Plavix. - Physical exam unremarkable for any upper or lower extremity weakness, sensory deficient and focal neurological sign. -MRI no evidence of acute intracranial abnormality.  Age-related cerebral atrophy with moderate to advanced chronic microvascular ischemic disease.  Remote lacunar infraction.   Transaminitis - Elevated ALP at 233 and AST 47.  Normal bilirubin level.  Patient denies any right upper quadrant and midepigastric abdominal pain. -Transaminitis likely secondary to dehydration and underlying history of hereditary  hemochromatosis. -Checking hepatitis panel.   Non-insulin-dependent DM type II -History of non-insulin-dependent DM type II diet controlled. - Continue heart healthy carb modified diet.  Hyperlipidemia -Continue Lipitor  History of TIA -Continue Lipitor and Plavix.  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    History of chronic combined systolic diastolic heart failure -At home patient is not any blood pressure regimen given patient is on hospice care.  Patient follows outpatient cardiology.  History of CAD -Continue Lipitor and Plavix.  Patient is full code however at the same time he is on home hospice.   Patient is on minimum home medications at home. Consulting inpatient palliative care to discuss further goal of care with patient and his family.   DVT prophylaxis:  SQ Heparin Code Status:  Full Code Diet: Heart healthy carb modified diet Family Communication: Patient's daughter available of over phone. Disposition Plan: Will follow-up with blood cultures result for appropriate antibiotic guidance. Consults: Palliative care Admission status:   Inpatient, Telemetry bed  Severity of Illness: The appropriate patient status for this patient is INPATIENT. Inpatient status is judged to be reasonable and necessary in order to provide the required intensity of service to ensure the patient's safety. The patient's presenting symptoms, physical exam findings, and initial radiographic and laboratory data in the context of their chronic comorbidities is felt to place them at high risk for further clinical deterioration. Furthermore, it is not anticipated that the patient will be medically stable for discharge from the hospital within 2 midnights of admission.   * I certify that at the point of admission it is my clinical judgment that the patient will require inpatient hospital care spanning beyond 2 midnights from the point of admission due to high intensity of service, high risk for further deterioration and high frequency of surveillance required.[name]    Tereasa Coop, MD [facility]  How to contact the [facility] Attending or Consulting provider 7A - 7P or covering provider during after hours 7P -7A, for this patient.  Check the care team in [facility] and look for a) attending/consulting [facility] provider listed and b) the [facility] team listed Log into [email or web address] and use [facility] universal password to access. If you do not have the password, please contact the hospital operator. Locate the [facility] provider you are looking for under [facility] and page to a number that you can be directly reached. If  you still have difficulty reaching the provider, please page the [facility] (Director on Call) for the Hospitalists listed on amion for assistance.  02/29/2024, 6:47 AM

## 2024-02-29 NOTE — Consult Note (Signed)
 Consultation Note Date: 02/29/2024   Patient Name: [name] [name]  DOB: 1941/11/13  MRN: [id number]  Age / Sex: 83 y.o., male  PCP: Doreene Nest, NP Referring Physician: Kirke Corin, Masiku Agat*  Reason for Consultation: Establishing goals of care  HPI/Patient Profile: 83 y.o. 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   Clinical Assessment and Goals of Care: Consult received and chart review completed. Discussed with [facility] hospice liaison. I met today with [name] [name] and no visitors at bedside. [name] [name] is somnolent. He does open his eyes briefly when I moved his legs. He does not track. He did respond with delayed "no" when asked if he was in pain. No further verbal response. He does move lips making it seem he was trying or wanted to respond to me. No distress.  I called and spoke with daughter, [name] [name]. [name] [name] shares that she is HCPOA - she will bring paperwork when she visits next. She has been trying to stay away right now as she and her mother have had a stomach bug - with [name] [name] diarrhea there is concern that he now has this as well (I have relayed this information to attending physician). [name] [name] shares that her father was able to walk a few feet with his walker to wheelchair prior to ~5 weeks ago when he had what seemed to be a respiratory infection or URI of some sort. His wife had back injury and procedure requiring [name] [name] to have emergency respite care in which he was bedbound further leading to his significant weakness and declined  functional state. [name] [name] shares hope that he can recover from current illness and possibly even consider SNF rehab stay. She acknowledges that we are not close to considering rehab and she understands the severity of his illness as well as the possibility that he will not recover.   Although [name] [name] is hopeful she is also realistic. We did discuss her father's wishes and she shares that they have not really had a direct conversation about code status and what he would want or not want. We discussed and she does not believe that her father would want to be resuscitated or intubated understanding that there would be no good path for him from there. However, she would ideally want to have the chance to discuss this with her father. She does understand that she may not have this opportunity. I prepared her that if his condition were to worsen that she may be asked this question and she will be prepared to give direction for care with likely DNR/DNI but will remain full code at this time.   [name] [name] shares that her father was on regular solid food diet with honey-thickened liquids and has history or silent aspiration (particularly with thin liquids).  He does well with chin tuck. She understands that he will not be able to have intake in his current state being so sleepy. He is hard of hearing and currently without his hearing aides.   All questions/concerns addressed. Emotional support provided. Discussed with hospice liaison, [name] [name], and updated attending physician.   Primary Decision Maker HCPOA daughter [name] [name]    SUMMARY OF RECOMMENDATIONS   - Time for outcomes - HCPOA hoping he can ultimate participate in code status discussion but seems to lean towards DNR/DNI if he were to decline further - [name] patient  Code Status/Advance Care Planning: Full code - would consider DNR if further decline   Symptom Management:  Per attending  Prognosis:  Overall prognosis poor.    Discharge Planning: To Be Determined      Primary Diagnoses: Present on Admission:  Hereditary hemochromatosis (HCC)  Hyperlipidemia   I have reviewed the medical record, interviewed the patient and family, and examined the patient. The following aspects are pertinent.  Past Medical History:  Diagnosis Date   Aspiration pneumonia vs. CAP 02/28/2022   Basal cell carcinoma 11/09/2019   nod & infil-behind right ear-cx3 &exc   Basal cell carcinoma 03/21/2020   Residual BCC with peripheral margin involved - ST recommends MOHs   Carotid artery occlusion    Coronary artery disease    s/p CABG 2005; sees Dr Eden Emms yearly   Diabetes mellitus without complication (HCC)    Gynecomastia    History of COVID-19 01/08/2022   Hypercholesterolemia    Hypertension    Neurodermatitis    Overweight(278.02)    Personal history of colonic polyps 10/08/2006   tubular adenomas   Renal insufficiency    Stroke [facility])    TIA  April 2022   Transient ischemic attack 2010   "lasted ~ 5 seconds"   Vitamin D deficiency    Social History   Socioeconomic History   Marital status: Married    Spouse name: [name]   Number of children: 1   Years of education: Not on file   Highest education level: Some college, no degree  Occupational History   Occupation: retired  Tobacco Use   Smoking status: Never   Smokeless tobacco: Former  Building services engineer status: Never Used  Substance and Sexual Activity   Alcohol use: No    Alcohol/week: 0.0 standard drinks of alcohol   Drug use: No   Sexual activity: Yes  Other Topics Concern   Not on file  Social History Narrative   Retired.   Once worked for the [employer].   Married.   Enjoys reading, spending time with family.    Left handed   Drinks caffeine   One story home   Social Drivers of Health   Financial Resource Strain: Low Risk  (06/24/2023)   Overall Financial Resource Strain (CARDIA)    Difficulty of Paying Living Expenses: Not hard at all   Food Insecurity: No Food Insecurity (02/25/2024)   Hunger Vital Sign    Worried About Running Out of Food in the Last Year: Never true    Ran Out of Food in the Last Year: Never true  Transportation Needs: No Transportation Needs (02/25/2024)   PRAPARE - Administrator, Civil Service (Medical): No    Lack of Transportation (Non-Medical): No  Physical Activity: Inactive (06/24/2023)   Exercise Vital Sign    Days of Exercise per Week: 0 days    Minutes of Exercise per Session: 0 min  Stress: No Stress Concern Present (06/24/2023)   [employer] of Occupational Health - Occupational Stress Questionnaire    Feeling of Stress : Not at all  Social Connections: Moderately Integrated (06/24/2023)   Social Connection and Isolation Panel [NHANES]    Frequency of Communication with Friends and Family: More than three times a week    Frequency of Social Gatherings with Friends and Family: More than three times a week    Attends Religious Services: More than 4 times per year    Active Member of [employer] or Organizations: No    Attends Engineer, structural: Never    Marital Status: Married   Family History  Problem Relation Age of Onset   Heart disease Mother        Before age 37   Diabetes Mother    Kidney disease Mother    Heart attack Mother 23   Lung cancer Father 15   Diabetes Brother    Heart disease Brother    Heart disease Brother    Arthritis Brother    Diabetes Sister    Fibromyalgia Sister    Lung cancer Paternal Uncle        questionable as to if it was lung ca   Healthy Daughter    Colon cancer Neg Hx    Stroke Neg Hx    Scheduled Meds:  atorvastatin  10 mg Oral Daily   clopidogrel  75 mg Oral Daily   sodium chloride flush  3 mL Intravenous Q12H   Continuous Infusions:  sodium chloride     acetaminophen     ceFEPime (MAXIPIME) IV 2 g (02/29/24 1034)   lactated ringers 150 mL/hr at 02/29/24 1031   vancomycin     PRN Meds:.sodium chloride,  acetaminophen **OR** acetaminophen, ondansetron **OR** ondansetron (ZOFRAN) IV, sodium chloride flush Allergies  Allergen Reactions   Niacin Other (See Comments)    intol to NIACIN w/ headaches    Ativan [Lorazepam] Other (See Comments)    agitation   Review of Systems  Unable to perform ROS: Acuity of condition    Physical Exam Vitals and nursing note reviewed.  Constitutional:      General: He is sleeping. He is not in acute distress.    Appearance: He is ill-appearing.  Cardiovascular:     Rate and Rhythm: Tachycardia present.  Pulmonary:     Effort: No tachypnea, accessory muscle usage or respiratory distress.  Abdominal:     Palpations: Abdomen is soft.  Neurological:     Mental Status: He is lethargic and confused.     Vital Signs: BP (!) 179/85   Pulse (!) 124   Temp (!) 101 F (38.3 C) (Rectal)   Resp (!) 22   Ht 6' (1.829 m)   Wt 94.8 kg Comment: Wt from 11/2023  SpO2 97%   BMI 28.34 kg/m  Pain Scale: Faces   Pain Score: 0-No pain   SpO2: SpO2: 97 % O2 Device:SpO2: 97 % O2 Flow Rate: .   IO: Intake/output summary:  Intake/Output Summary (Last 24 hours) at 02/29/2024 1113 Last data filed at 02/29/2024 1610 Gross per 24 hour  Intake 1313.46 ml  Output --  Net 1313.46 ml    LBM:   Baseline Weight: Weight: 94.8 kg (Wt from 11/2023) Most recent weight: Weight: 94.8 kg (Wt from 11/2023)     Palliative Assessment/Data:    Time Total: 75 min  Greater than 50%  of this time was spent counseling and coordinating care related  to the above assessment and plan.  Signed by: Yong Channel, NP Palliative Medicine Team Pager # [phone] (M-F 8a-5p) Team Phone # [phone] (Nights/Weekends)

## 2024-02-29 NOTE — Progress Notes (Signed)
 [name] [phone] [facility]    Home Medication Profile   GUAIFENESIN 100 MG/5 ML ORAL LIQUID    01/15/2024  RESPIRATORY THERAPY AGENTS  10 mL    EVERY 4 HOURS  Reason:  COUGH  HYDROXYZINE HCL 10 MG TABLET    02/27/2024  CENTRAL NERVOUS SYSTEM AGENTS  1 tablet    EVERY 4 HOURS Reason:  ITCHING  LASIX 20 MG TABLET    02/12/2024  CARDIOVASCULAR THERAPY AGENTS  1 tablet    DAILY  ORAL Reason:  EDEMA  LIPITOR 10 MG TABLET    12/19/2023  CARDIOVASCULAR THERAPY AGENTS  1 tablet    BEDTIME  Reason:  CHOLESTEROL  MECOBALAMIN (VITAMIN B12) 500 MCG CHEWABLE TABLET 12/19/2023  ELECTROLYTE BALANCE-NUTRITIONAL PR  1 tablet    DAILY   Reason:  LOW VITAMIN B12  METAMUCIL (WITH SUGAR) 3.4 GRAM/7 GRAM ORAL POWDER    12/19/2023  GASTROINTESTINAL THERAPY AGENTS   Per instructions    DAILY   Reason:  DIARRHEA Instructions:  TAKE AS DIRECTED  PLAVIX 75 MG TABLET    12/19/2023  HEMATOLOGICAL AGENTS  1 tablet    DAILY  ORAL   Reason:  BLOOD THINNER AFTER STROKE  SARNA SENSITIVE 1 % LOTION    02/27/2024  DERMATOLOGICAL   Per instructions    4 TIMES DAILY  TOPICAL Reason:  ITCHING Instructions:  APPLY TO BACK  VITAMIN D3 25 MCG (1,000 UNIT) TABLET    12/19/2023  ELECTROLYTE BALANCE-NUTRITIONAL PRODUCTS  1 tablet    DAILY  Reason:  LOW VITAMIN D  Roe Rutherford, BSN, [facility] [phone]

## 2024-03-01 DIAGNOSIS — R531 Weakness: Secondary | ICD-10-CM

## 2024-03-01 DIAGNOSIS — Z7189 Other specified counseling: Secondary | ICD-10-CM | POA: Diagnosis not present

## 2024-03-01 DIAGNOSIS — N390 Urinary tract infection, site not specified: Secondary | ICD-10-CM

## 2024-03-01 DIAGNOSIS — N179 Acute kidney failure, unspecified: Secondary | ICD-10-CM | POA: Diagnosis not present

## 2024-03-01 DIAGNOSIS — D72829 Elevated white blood cell count, unspecified: Secondary | ICD-10-CM

## 2024-03-01 DIAGNOSIS — Z515 Encounter for palliative care: Secondary | ICD-10-CM | POA: Diagnosis not present

## 2024-03-01 LAB — BASIC METABOLIC PANEL
Anion gap: 9 (ref 5–15)
BUN: 21 mg/dL (ref 8–23)
CO2: 21 mmol/L — ABNORMAL LOW (ref 22–32)
Calcium: 7.6 mg/dL — ABNORMAL LOW (ref 8.9–10.3)
Chloride: 108 mmol/L (ref 98–111)
Creatinine, Ser: 1.12 mg/dL (ref 0.61–1.24)
GFR, Estimated: >60 mL/min (ref >60)
Glucose, Bld: 135 mg/dL — ABNORMAL HIGH (ref 70–99)
Potassium: 2.8 mmol/L — ABNORMAL LOW (ref 3.5–5.1)
Sodium: 138 mmol/L (ref 135–145)

## 2024-03-01 LAB — CBC
HCT: 40.7 % (ref 39.0–52.0)
Hemoglobin: 13.5 g/dL (ref 13.0–17.0)
MCH: 33.8 pg (ref 26.0–34.0)
MCHC: 33.2 g/dL (ref 30.0–36.0)
MCV: 101.8 fL — ABNORMAL HIGH (ref 80.0–100.0)
Platelets: 241 10*3/uL (ref 150–400)
RBC: 4 MIL/uL — ABNORMAL LOW (ref 4.22–5.81)
RDW: 13.3 % (ref 11.5–15.5)
WBC: 11.1 10*3/uL — ABNORMAL HIGH (ref 4.0–10.5)
nRBC: 0 % (ref 0.0–0.2)

## 2024-03-01 LAB — MAGNESIUM: Magnesium: 1.7 mg/dL (ref 1.7–2.4)

## 2024-03-01 LAB — GASTROINTESTINAL PANEL BY PCR, STOOL (REPLACES STOOL CULTURE)

## 2024-03-01 LAB — GLUCOSE, CAPILLARY
Glucose-Capillary: 113 mg/dL — ABNORMAL HIGH (ref 70–99)
Glucose-Capillary: 120 mg/dL — ABNORMAL HIGH (ref 70–99)
Glucose-Capillary: 127 mg/dL — ABNORMAL HIGH (ref 70–99)
Glucose-Capillary: 127 mg/dL — ABNORMAL HIGH (ref 70–99)
Glucose-Capillary: 133 mg/dL — ABNORMAL HIGH (ref 70–99)
Glucose-Capillary: 137 mg/dL — ABNORMAL HIGH (ref 70–99)

## 2024-03-01 LAB — URINE CULTURE

## 2024-03-01 LAB — HEMOGLOBIN A1C
Hgb A1c MFr Bld: 6.6 % — ABNORMAL HIGH (ref 4.8–5.6)
Mean Plasma Glucose: 142.72 mg/dL

## 2024-03-01 LAB — PHOSPHORUS: Phosphorus: 3 mg/dL (ref 2.5–4.6)

## 2024-03-01 MED ORDER — INSULIN ASPART 100 UNIT/ML IJ SOLN
0.0000 [IU] | Freq: Three times a day (TID) | INTRAMUSCULAR | Status: DC
  Administered 2024-03-01: 1 [IU] via SUBCUTANEOUS
  Administered 2024-03-02: 2 [IU] via SUBCUTANEOUS
  Administered 2024-03-02 – 2024-03-04 (×3): 1 [IU] via SUBCUTANEOUS

## 2024-03-01 MED ORDER — SODIUM CHLORIDE 0.9 % IV SOLN
2.0000 g | INTRAVENOUS | Status: DC
  Administered 2024-03-01 – 2024-03-02 (×2): 2 g via INTRAVENOUS
  Filled 2024-03-01 (×2): qty 20

## 2024-03-01 MED ORDER — POTASSIUM CHLORIDE 10 MEQ/100ML IV SOLN
10.0000 meq | INTRAVENOUS | Status: AC
Start: 2024-03-01 — End: 2024-03-01
  Administered 2024-03-01 (×4): 10 meq via INTRAVENOUS
  Filled 2024-03-01 (×4): qty 100

## 2024-03-01 MED ORDER — TAMSULOSIN HCL 0.4 MG PO CAPS
0.4000 mg | ORAL_CAPSULE | Freq: Every day | ORAL | Status: DC
  Administered 2024-03-01 – 2024-03-04 (×4): 0.4 mg via ORAL
  Filled 2024-03-01 (×4): qty 1

## 2024-03-01 MED ORDER — FOOD THICKENER (SIMPLYTHICK): 1.0000 | ORAL | Status: DC | PRN

## 2024-03-01 MED ORDER — MAGNESIUM SULFATE 2 GM/50ML IV SOLN
2.0000 g | Freq: Once | INTRAVENOUS | Status: AC
  Administered 2024-03-01: 2 g via INTRAVENOUS
  Filled 2024-03-01: qty 50

## 2024-03-01 NOTE — Progress Notes (Signed)
 [name] [name] Room [phone] [facility] Hospitalized Hospice Patient Visit    [name] is a current hospice patient with a terminal diagnosis of  Cerebrovascular Disease. Family contacted EMS to assist with getting patient from chair to bed. Upon arrival, EMS noted that patient had a low-grade fever, dark cloudy urine, tachycardic and was hypotensive. Family reported that patient had been weak, not eating or drinking all day. [facility] notified of patient condition and family requested evaluation at hospital. Patient was admitted to [facility] on 02/29/24 with diagnosis of Sepsis. Per Dr. Gordy Savers with [facility], this is a related hospital admission.    Visited patient at bedside. No family present at that time. Patient  was resting with eyes closed.     Patient is GIP appropriate for IV antibiotics needed to treat his sepsis.    Vital Signs:  97.3/124/23     87/60   96% on 2L   I&O 4504.06/1799   Abnormal labs  03/01/24 06:02 Potassium: 2.8 (L) CO2: 21 (L) Glucose: 135 (H) Calcium: 7.6 (L) WBC: 11.1 (H) RBC: 4.00 (L) MCV: 101.8 (H)    Diagnostics:    None new   IV/PRN Meds Ofirmev 1000mg  IV x3, Cefepime 2g IV x1, Rocephin 2g IV x1, Magnesium 2g IV x1, Potassium 10 mEQ IV x4, Vancomycin 750mg  IV x2   Problem list as per MD note Jeoffrey Massed, MD 3.02.2025 Sepsis Unclear source Not sure if this was due to gastroenteritis Stool studies pending Suspect can stop vancomycin/cefepime and just maintain on Rocephin pending culture/stool studies.   Acute metabolic encephalopathy Likely secondary to sepsis/dehydration from diarrhea Improved with IV fluids/IV antibiotics Neuroimaging negative.   AKI Likely due to urinary retention Mild Has resolved with Foley catheter placement   Acute urinary retention Start Flomax Continue Foley Mobilize-voiding trial when a bit more mobile.   Hypokalemia Secondary to diarrhea Replete/recheck    Hypomagnesia Replete/recheck   CAD No anginal symptoms Plavix/statin   Chronic HFpEF Euvolemic on exam-some chronic leg edema Lasix on hold   HLD Statin   DM-2 (A1c 7.2 on 05/29/2023) Starting SSI   Hereditary hemochromatosis Reviewed last oncology/hematology note-no further therapeutic phlebotomy planned Has been established with home hospice   Debility/deconditioning Per daughter-significant decline in function over the past 4 weeks-before this he was able to ambulate with the help of walker PT/OT eval ordered-daughter interested in rehab on discharge   Discharge Planning: Ongoing. Per provider note, daughter interested in rehab upon discharge.   Family contact: Called daughter and message left. Awaiting call back.   IDT:  Updated      Goals of Care- Full Code at this time. Daughter is hopeful that he will improve enough for her to have a conversation with him to potentially change status.  Palliative care consulted.     Should patient need ambulance transfer at discharge- please use [facility]) as they contract this service for our active hospice patients.      Roe Rutherford, BSN, RN   Hospice Nurse Liaison   [phone]

## 2024-03-01 NOTE — Progress Notes (Signed)
 Palliative Medicine Inpatient Follow Up Note HPI:  83 y.o. 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   The Palliative care team is involved to support additional goals of care conversations.   Today's Discussion 03/01/2024  *Please note that this is a verbal dictation therefore any spelling or grammatical errors are due to the "Dragon Medical One" system interpretation.  Chart reviewed inclusive of vital signs, progress notes, laboratory results, and diagnostic images.   I met with [name] [name] at bedside this morning. He is more clear minded though responds slowly. He does not appear to be in any distress. He shares that he has no pain, shortness of breath, or nausea this morning. I shared his reason for hospitalization inclusive of sepsis though it is unclear where this may have initiated.   Created space and opportunity for patient to explore thoughts feelings and fears regarding current medical situation. We discussed his wishes if he were to go into cardiopulmonary arrest.  We discussed the potential trauma associated with chest compressions, electrical shocks, and intubation.  I shared I would be concerned putting [name] [name] through this considering his frailty and present clinical state.  He shares he does not know if he would want to go through this or not.  We reviewed the importance of ongoing discussions with his daughter, [name] [name] pertaining to his wishes moving into the future.  I called [name] [name] on the phone and we discussed patient's clinical state.  We reviewed that he is more clear minded and she does agree with this.  As far as cardiopulmonary  resuscitation status she would like for him to be as clear minded as possible to make this determination.  She feels strongly that when he does leave the hospital it would be of benefit for him to invoke a DNR.  I shared the importance of this in addition to considering all realms of treatment antibiotics, fluids, & oxygen support.  We reviewed that I would send her a MOST form by email.  I shared that I would inform hospice of this conversation so they could continue to discuss what [name] [name] would and would not want in the near future.  Emailed patient's daughter [name] [name] a "MOST" form & The "Hard Choices for [name]" booklet.   Questions and concerns addressed/Palliative Support Provided.   Objective Assessment: Vital Signs Vitals:   03/01/24 0805 03/01/24 0942  BP: (!) 87/60   Pulse: (!) 123 76  Resp: (!) 23   Temp: (!) 97.3 F (36.3 C) (!) 97.4 F (36.3 C)  SpO2: 96%     Intake/Output Summary (Last 24 hours) at 03/01/2024 1040 Last data filed at 03/01/2024 1012 Gross per 24 hour  Intake 3191.25 ml  Output 1800 ml  Net 1391.25 ml   Last Weight  Most recent update: 02/28/2024 11:56 PM    Weight  94.8 kg (208 lb 15.9 oz)            Gen:  Frail elderly Caucasian M in NAD HEENT: moist mucous membranes CV: Regular rate and rhythm  PULM:  On 2LPM [name], breathing is even and nonlabored ABD: soft/nontender  EXT: No edema  Neuro: Alert and oriented to person and place  SUMMARY OF RECOMMENDATIONS   Full code at  this time - Patients daughter would like for [name] [name] to make this determination for himself. She feels that once he leaves the hospital a DNAR is what she would like for him to fill out. I provided a MOST form via email for [name] [name] to review additionally.   I have informed [name] of the above  Plan to continue to allow time for outcomes  Ongoing PMT support  Billing based on MDM: High in the setting of decisions regarding excalation of care and code  status.  ______________________________________________________________________________________ [name] [name] [name] [name] Palliative Medicine Team Team Cell Phone: [phone] Please utilize secure chat with additional questions, if there is no response within 30 minutes please call the above phone number  Palliative Medicine Team providers are available by phone from 7am to 7pm daily and can be reached through the team cell phone.  Should this patient require assistance outside of these hours, please call the patient's attending physician.

## 2024-03-01 NOTE — Evaluation (Signed)
 Clinical/Bedside Swallow Evaluation Patient Details  Name: [name] [name] MRN: [id number] Date of Birth: April 28, 1941  Today's Date: 03/01/2024 Time: SLP Start Time (ACUTE ONLY): 0840 SLP Stop Time (ACUTE ONLY): 3557 SLP Time Calculation (min) (ACUTE ONLY): 41 min  Past Medical History:  Past Medical History:  Diagnosis Date   Aspiration pneumonia vs. CAP 02/28/2022   Basal cell carcinoma 11/09/2019   nod & infil-behind right ear-cx3 &exc   Basal cell carcinoma 03/21/2020   Residual BCC with peripheral margin involved - ST recommends [facility]   Carotid artery occlusion    Coronary artery disease    s/p CABG 2005; sees Dr Eden Emms yearly   Diabetes mellitus without complication (HCC)    Gynecomastia    History of COVID-19 01/08/2022   Hypercholesterolemia    Hypertension    Neurodermatitis    Overweight(278.02)    Personal history of colonic polyps 10/08/2006   tubular adenomas   Renal insufficiency    Stroke [facility])    TIA  April 2022   Transient ischemic attack 2010   "lasted ~ 5 seconds"   Vitamin D deficiency    Past Surgical History:  Past Surgical History:  Procedure Laterality Date   CARDIAC CATHETERIZATION  02/2004   "tried to stent; couldn't"   CAROTID ENDARTERECTOMY Right 08/26/2015   COLONOSCOPY     CORONARY ANGIOPLASTY     CORONARY ARTERY BYPASS GRAFT  Feb. 2005   4 vessel   ENDARTERECTOMY Right 08/26/2015   Procedure: Right Carotid ENDARTERECTOMY with Patch Angioplasty ;  Surgeon: Larina Earthly, MD;  Location: [facility] OR;  Service: Vascular;  Laterality: Right;   ENDARTERECTOMY Left 05/18/2021   Procedure: LEFT CAROTID ARTERY ENDARTERECTOMY  with patch angioplasty;  Surgeon: Larina Earthly, MD;  Location: [facility] OR;  Service: Vascular;  Laterality: Left;   EYE SURGERY     bilateral cataract   KELOID EXCISION  04/2008   on chest scar; Dr. Stephens November   KELOID EXCISION     PILONIDAL CYST EXCISION  1989   HPI:  83 yo male adm to [facility] with AMS. 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  Swallow eval ordered.   Most recent swallow evaluation in EPIC was 2023 with recommendatino for chin tuck and head turn left with single sips.  Wife reported at that time that the pt was not able to follow those directions.  MRI showed many remote lacunar bilteral basal ganglia, thalami, pons CVAs.  CXR negative for acute finding.  Pt reports he continues to use thickener at home - and it sounds as if it's nectar thick.  He is on precautions for potential Cdif.    Assessment / Plan / Recommendation  Clinical Impression  Pt has h/o silent dysphagia/aspiration due to prior CVAs and reports continued use of thickener at home. He states his morning coffee with thickener is what bothers him the most.  He appears with decreased movement of left lower facial movements but otherwise no overt CN deficits. Voice subjectively is marginally weak but cued cough is strong.  He brushed his teeth and was noted to cough when using water to clear.  Pt observed with near honey thick liquids, nectar thick liquids, applesauce and graham crackers.   No clinical indication of aspiration and swallow subjectively appeared mildly slow.  From discussion with pt, it sounds as if he uses nectar thick liquids at home, thus given his mild dysarthria and h/o silent aspiration - recommend dys3/nectar diet.  Advise  to allow him single ice chips.  Recommend he have a repeat MBS while here to determine if dietary restrictions are still advised/recommended. Pt reports he will "think about it".  Given he is also hospice, potential advancement in diet for his comfort may be beneficial to consider.  No family present at the time of visit.  Will follow up dysphagia management including potential for pt to have repeat MBS.  Provided swallow precaution sign - posted on window for staff and pt to see.  Thanks for this consult. SLP Visit Diagnosis: Dysphagia, unspecified (R13.10) (h/o oropharyngeal deficits)     Aspiration Risk  Mild aspiration risk    Diet Recommendation Dysphagia 3 (Mech soft);Nectar-thick liquid;Ice chips PRN after oral care    Liquid Administration via: Straw Medication Administration: Whole meds with puree Supervision: Full supervision/cueing for compensatory strategies Compensations: Slow rate;Small sips/bites;Chin tuck (head turn left) Postural Changes: Seated upright at 90 degrees;Remain upright for at least 30 minutes after po intake    Other  Recommendations Oral Care Recommendations: Oral care BID    Recommendations for follow up therapy are one component of a multi-disciplinary discharge planning process, led by the attending physician.  Recommendations may be updated based on patient status, additional functional criteria and insurance authorization.  Follow up Recommendations Follow physician's recommendations for discharge plan and follow up therapies      Assistance Recommended at Discharge    Functional Status Assessment Patient has had a recent decline in their functional status and demonstrates the ability to make significant improvements in function in a reasonable and predictable amount of time.  Frequency and Duration min 1 x/week  1 week       Prognosis Prognosis for improved oropharyngeal function: Good Barriers to Reach Goals: Time post onset      Swallow Study   General Date of Onset: 03/01/24 HPI: 83 yo male adm to [facility] with AMS. 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  Swallow eval ordered.   Most recent swallow evaluation in EPIC was 2023 with recommendatino for chin tuck and head turn left with single sips.  Wife reported at that time that the pt was not able to follow those directions.  MRI showed many remote lacunar bilteral basal ganglia, thalami, pons CVAs.  CXR negative for acute finding.  Pt reports he continues to use thickener at home - and it sounds as if it's nectar thick.  He is on precautions for  potential Cdif. Type of Study: Bedside Swallow Evaluation Previous Swallow Assessment: see HPI Diet Prior to this Study: NPO Temperature Spikes Noted: No Respiratory Status: Nasal cannula History of Recent Intubation: No Behavior/Cognition: Alert;Cooperative;Pleasant mood Oral Cavity Assessment: Within Functional Limits Oral Care Completed by SLP: Yes Oral Cavity - Dentition: Adequate natural dentition Vision: Functional for self-feeding Self-Feeding Abilities: Able to feed self Patient Positioning: Upright in bed Baseline Vocal Quality: Normal Volitional Cough: Strong Volitional Swallow: Able to elicit    Oral/Motor/Sensory Function Overall Oral Motor/Sensory Function: Mild impairment Facial ROM: Reduced left Facial Strength: Reduced left Lingual ROM: Suspected CN XII (hypoglossal) dysfunction Lingual Symmetry: Within Functional Limits Lingual Strength: Suspected CN XII (hypoglossal) dysfunction Velum: Within Functional Limits Mandible: Within Functional Limits   Ice Chips Ice chips: Within functional limits   Thin Liquid Thin Liquid: Not tested    Nectar Thick Nectar Thick Liquid: Within functional limits Presentation: Straw Other Comments: using postures   Honey Thick Honey Thick Liquid: Within functional limits Presentation: Straw Other Comments: using  head turn left and chin tuck   Puree Puree: Within functional limits Presentation: Spoon   Solid     Solid: Within functional limits      Chales Abrahams 03/01/2024,9:41 AM  Rolena Infante, MS [facility] SLP Acute Rehab Services Office [phone]

## 2024-03-01 NOTE — Progress Notes (Addendum)
 PROGRESS NOTE        PATIENT DETAILS Name: [name] [name] Age: 83 y.o. Sex: male Date of Birth: 07/06/1941 Admit Date: 02/28/2024 Admitting Physician Tereasa Coop, MD ZOX:[id number], Keane Scrape, NP  Brief Summary: Patient is a 83 y.o.  male with history of hereditary hemochromatosis, DM-2, HTN, HLD, CAD, chronic combined HFpEF/HFrEF, TIA-followed at home by hospice-presented with generalized weakness/lethargy.  Significant events: 2/28>> admit to [facility]  Significant studies: 2/28>> MRI brain: No acute intracranial abnormality. 2/28>> CXR: No PNA.  Significant microbiology data: 3/01>> COVID/influenza/RSV PCR: Negative 3/01>> stool C. difficile studies: Negative 3/01>> GI pathogen panel: Pending 3/01>> blood culture: No growth  Procedures: None  Consults: Palliative care  Subjective: Awake/alert-no family at bedside-does not remember why he is in the hospital.  Discussed with nursing staff-no diarrhea overnight.  Objective: Vitals: Blood pressure (!) 87/60, pulse 76, temperature (!) 97.4 F (36.3 C), resp. rate (!) 23, height 6' (1.829 m), weight 94.8 kg, SpO2 96%.   Exam: Gen Exam:Alert awake-not in any distress HEENT:atraumatic, normocephalic Chest: B/L clear to auscultation anteriorly CVS:S1S2 regular Abdomen:soft non tender, non distended Extremities:no edema Neurology: Non focal Skin: no rash  Pertinent Labs/Radiology:    Latest Ref Rng & Units 03/01/2024    6:02 AM 02/29/2024    3:38 AM 02/28/2024   10:14 PM  CBC  WBC 4.0 - 10.5 K/uL 11.1  24.5  26.1   Hemoglobin 13.0 - 17.0 g/dL 54.0  98.1  19.1   Hematocrit 39.0 - 52.0 % 40.7  44.9  43.4   Platelets 150 - 400 K/uL 241  351  370     Lab Results  Component Value Date   NA 138 03/01/2024   K 2.8 (L) 03/01/2024   CL 108 03/01/2024   CO2 21 (L) 03/01/2024      Assessment/Plan: Sepsis Unclear source Not sure if this was due to gastroenteritis Stool studies pending Suspect  can stop vancomycin/cefepime and just maintain on Rocephin pending culture/stool studies.  Acute metabolic encephalopathy Likely secondary to sepsis/dehydration from diarrhea Improved with IV fluids/IV antibiotics Neuroimaging negative.  AKI Likely due to urinary retention Mild Has resolved with Foley catheter placement  Acute urinary retention Start Flomax Continue Foley Mobilize-voiding trial when a bit more mobile.  Hypokalemia Secondary to diarrhea Replete/recheck  Hypomagnesia Replete/recheck  CAD No anginal symptoms Plavix/statin  Chronic HFpEF Euvolemic on exam-some chronic leg edema Lasix on hold  HLD Statin  DM-2 (A1c 7.2 on 05/29/2023) Starting SSI  Hereditary hemochromatosis Reviewed last oncology/hematology note-no further therapeutic phlebotomy planned Has been established with home hospice  Debility/deconditioning Per daughter-significant decline in function over the past 4 weeks-before this he was able to ambulate with the help of walker PT/OT eval ordered-daughter interested in rehab on discharge.  Goals of care Full code Follows with home hospice in the outpatient setting Frail/debilitated-do not think he is a good candidate for aggressive care if he were to deteriorate/decompensate Palliative care following-will await further recommendations.  Pressure Ulcer: Agree with assessment and plan as outlined below. Pressure Injury 02/29/24 Buttocks Right;Left;Mid Stage 1 -  Intact skin with non-blanchable redness of a localized area usually over a bony prominence. (Active)  02/29/24 1500  Location: Buttocks  Location Orientation: Right;Left;Mid  Staging: Stage 1 -  Intact skin with non-blanchable redness of a localized area usually over a bony prominence.  Wound Description (  Comments):   Present on Admission: Yes  Dressing Type Foam - Lift dressing to assess site every shift 02/29/24 2303    BMI: Estimated body mass index is 28.34 kg/m as  calculated from the following:   Height as of this encounter: 6' (1.829 m).   Weight as of this encounter: 94.8 kg.   Code status:   Code Status: Full Code   DVT Prophylaxis: enoxaparin (LOVENOX) injection 40 mg Start: 02/29/24 1445 SCDs Start: 02/29/24 0124 Place TED hose Start: 02/29/24 0124    Family Communication: Daugther-Michelle-[phone] updated 3/2   Disposition Plan: Status is: Inpatient Remains inpatient appropriate because: Severity of illness   Planned Discharge Destination:Hospice care   Diet: Diet Order             DIET DYS 3 Room service appropriate? Yes; Fluid consistency: Nectar Thick  Diet effective now                     Antimicrobial agents: Anti-infectives (From admission, onward)    Start     Dose/Rate Route Frequency Ordered Stop   02/29/24 1300  vancomycin (VANCOREADY) IVPB 750 mg/150 mL        750 mg 150 mL/hr over 60 Minutes Intravenous Every 12 hours 02/29/24 0216     02/29/24 1100  ceFEPIme (MAXIPIME) 2 g in sodium chloride 0.9 % 100 mL IVPB        2 g 200 mL/hr over 30 Minutes Intravenous Every 12 hours 02/29/24 0216     02/29/24 0000  vancomycin (VANCOREADY) IVPB 2000 mg/400 mL        2,000 mg 200 mL/hr over 120 Minutes Intravenous  Once 02/28/24 2357 02/29/24 0242   02/28/24 2345  ceFEPIme (MAXIPIME) 2 g in sodium chloride 0.9 % 100 mL IVPB        2 g 200 mL/hr over 30 Minutes Intravenous  Once 02/28/24 2343 02/29/24 0020   02/28/24 2345  metroNIDAZOLE (FLAGYL) IVPB 500 mg        500 mg 100 mL/hr over 60 Minutes Intravenous  Once 02/28/24 2343 02/29/24 0135   02/28/24 2345  vancomycin (VANCOCIN) IVPB 1000 mg/200 mL premix  Status:  Discontinued        1,000 mg 200 mL/hr over 60 Minutes Intravenous  Once 02/28/24 2343 02/28/24 2357        MEDICATIONS: Scheduled Meds:  atorvastatin  10 mg Oral Daily   clopidogrel  75 mg Oral Daily   enoxaparin (LOVENOX) injection  40 mg Subcutaneous Q24H   sodium chloride flush  3  mL Intravenous Q12H   Continuous Infusions:  ceFEPime (MAXIPIME) IV 2 g (02/29/24 2229)   potassium chloride     vancomycin 750 mg (03/01/24 0043)   PRN Meds:.acetaminophen **OR** acetaminophen, dextrose, food thickener, ondansetron **OR** ondansetron (ZOFRAN) IV, sodium chloride flush   I have personally reviewed following labs and imaging studies  LABORATORY DATA: CBC: Recent Labs  Lab 02/28/24 2214 02/29/24 0338 03/01/24 0602  WBC 26.1* 24.5* 11.1*  NEUTROABS 22.8*  --   --   HGB 15.0 14.8 13.5  HCT 43.4 44.9 40.7  MCV 101.4* 103.9* 101.8*  PLT 370 351 241    Basic Metabolic Panel: Recent Labs  Lab 02/28/24 2214 02/29/24 0338 03/01/24 0602  NA 138 138 138  K 3.4* 3.5 2.8*  CL 102 103 108  CO2 22 23 21*  GLUCOSE 227* 138* 135*  BUN 15 16 21   CREATININE 1.38* 1.23 1.12  CALCIUM 8.1* 7.9*  7.6*  MG  --   --  1.7  PHOS  --   --  3.0    GFR: Estimated Creatinine Clearance: 62.9 mL/min (by C-G formula based on SCr of 1.12 mg/dL).  Liver Function Tests: Recent Labs  Lab 02/29/24 0014 02/29/24 0338  AST 41 47*  ALT 35 40  ALKPHOS 233* 271*  BILITOT 0.6 1.0  PROT 5.2* 6.1*  ALBUMIN 2.0* 2.5*   No results for input(s): "LIPASE", "AMYLASE" in the last 168 hours. No results for input(s): "AMMONIA" in the last 168 hours.  Coagulation Profile: Recent Labs  Lab 02/29/24 0014  INR 1.1    Cardiac Enzymes: No results for input(s): "CKTOTAL", "CKMB", "CKMBINDEX", "TROPONINI" in the last 168 hours.  BNP (last 3 results) Recent Labs    10/28/23 1215  PROBNP 711*    Lipid Profile: No results for input(s): "CHOL", "HDL", "LDLCALC", "TRIG", "CHOLHDL", "LDLDIRECT" in the last 72 hours.  Thyroid Function Tests: No results for input(s): "TSH", "T4TOTAL", "FREET4", "T3FREE", "THYROIDAB" in the last 72 hours.  Anemia Panel: No results for input(s): "VITAMINB12", "FOLATE", "FERRITIN", "TIBC", "IRON", "RETICCTPCT" in the last 72 hours.  Urine analysis:     Component Value Date/Time   COLORURINE AMBER (A) 02/29/2024 0859   APPEARANCEUR CLOUDY (A) 02/29/2024 0859   LABSPEC 1.016 02/29/2024 0859   PHURINE 6.0 02/29/2024 0859   GLUCOSEU NEGATIVE 02/29/2024 0859   GLUCOSEU 100 mg/dL (A) 16/10/9603 5409   HGBUR SMALL (A) 02/29/2024 0859   BILIRUBINUR NEGATIVE 02/29/2024 0859   KETONESUR NEGATIVE 02/29/2024 0859   PROTEINUR NEGATIVE 02/29/2024 0859   UROBILINOGEN 0.2 08/18/2015 0905   NITRITE NEGATIVE 02/29/2024 0859   LEUKOCYTESUR LARGE (A) 02/29/2024 0859    Sepsis Labs: Lactic Acid, Venous    Component Value Date/Time   LATICACIDVEN 3.0 (HH) 02/29/2024 0859    MICROBIOLOGY: Recent Results (from the past 240 hours)  Resp panel by RT-PCR (RSV, Flu A&B, Covid) Anterior Nasal Swab     Status: None   Collection Time: 02/29/24 12:14 AM   Specimen: Anterior Nasal Swab  Result Value Ref Range Status   SARS Coronavirus 2 by RT PCR NEGATIVE NEGATIVE Final   Influenza A by PCR NEGATIVE NEGATIVE Final   Influenza B by PCR NEGATIVE NEGATIVE Final    Comment: (NOTE) The Xpert Xpress SARS-CoV-2/FLU/RSV plus assay is intended as an aid in the diagnosis of influenza from Nasopharyngeal swab specimens and should not be used as a sole basis for treatment. Nasal washings and aspirates are unacceptable for Xpert Xpress SARS-CoV-2/FLU/RSV testing.  Fact Sheet for Patients: BloggerCourse.com  Fact Sheet for Healthcare Providers: SeriousBroker.it  This test is not yet approved or cleared by the [place] FDA and has been authorized for detection and/or diagnosis of SARS-CoV-2 by FDA under an Emergency Use Authorization (EUA). This EUA will remain in effect (meaning this test can be used) for the duration of the COVID-19 declaration under Section 564(b)(1) of the Act, 21 U.S.C. section 360bbb-3(b)(1), unless the authorization is terminated or revoked.     Resp Syncytial Virus by PCR  NEGATIVE NEGATIVE Final    Comment: (NOTE) Fact Sheet for Patients: BloggerCourse.com  Fact Sheet for Healthcare Providers: SeriousBroker.it  This test is not yet approved or cleared by the [place] FDA and has been authorized for detection and/or diagnosis of SARS-CoV-2 by FDA under an Emergency Use Authorization (EUA). This EUA will remain in effect (meaning this test can be used) for the duration of the COVID-19 declaration under Section  564(b)(1) of the Act, 21 U.S.C. section 360bbb-3(b)(1), unless the authorization is terminated or revoked.  Performed at [facility], [address], [place], [place] [address]   Blood Culture (routine x 2)     Status: None (Preliminary result)   Collection Time: 02/29/24 12:14 AM   Specimen: BLOOD RIGHT HAND  Result Value Ref Range Status   Specimen Description BLOOD RIGHT HAND  Final   Special Requests   Final    BOTTLES DRAWN AEROBIC AND ANAEROBIC Blood Culture adequate volume   Culture   Final    NO GROWTH 1 DAY Performed at [facility], [address], [place], [place] [address]    Report Status PENDING  Incomplete  Blood Culture (routine x 2)     Status: None (Preliminary result)   Collection Time: 02/29/24 12:14 AM   Specimen: BLOOD  Result Value Ref Range Status   Specimen Description BLOOD RIGHT ANTECUBITAL  Final   Special Requests   Final    BOTTLES DRAWN AEROBIC AND ANAEROBIC Blood Culture results may not be optimal due to an inadequate volume of blood received in culture bottles   Culture   Final    NO GROWTH 1 DAY Performed at [facility], [address], [place], [place] [address]    Report Status PENDING  Incomplete  Urine Culture     Status: Abnormal   Collection Time: 02/29/24  8:59 AM   Specimen: Urine, Random  Result Value Ref Range Status   Specimen Description URINE, RANDOM  Final   Special Requests   Final    NONE Reflexed from  [id number] Performed at [facility], [address], [place], [place] [address]    Culture MULTIPLE SPECIES PRESENT, SUGGEST RECOLLECTION (A)  Final   Report Status 03/01/2024 FINAL  Final  C Difficile Quick Screen w PCR reflex     Status: None   Collection Time: 02/29/24  1:14 PM   Specimen: Stool  Result Value Ref Range Status   C Diff antigen NEGATIVE NEGATIVE Final   C Diff toxin NEGATIVE NEGATIVE Final   C Diff interpretation No C. difficile detected.  Final    Comment: Performed at [facility], [address], [place], [place] [address]    RADIOLOGY STUDIES/RESULTS: MR BRAIN WO CONTRAST Result Date: 02/29/2024 CLINICAL DATA:  Initial evaluation for acute TIA. EXAM: MRI HEAD WITHOUT CONTRAST TECHNIQUE: Multiplanar, multiecho pulse sequences of the brain and surrounding structures were obtained without intravenous contrast. COMPARISON:  MRI from 08/21/2022 FINDINGS: Brain: Examination moderately degraded by motion artifact. Generalized age-related cerebral atrophy. Patchy and confluent T2/FLAIR hyperintensity involving the periventricular deep white matter both cerebral hemispheres as well as the pons, consistent with chronic small vessel ischemic disease, moderate to advanced in nature. Multiple remote lacunar infarcts present about the bilateral basal ganglia, thalami, and pons. No abnormal foci of restricted diffusion to suggest acute or subacute ischemia. Gray-white matter differentiation maintained. No areas of chronic cortical infarction. No acute intracranial hemorrhage. 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 Few small chronic micro hemorrhages noted within the right cerebral hemisphere. No mass lesion, midline shift or mass effect. Mild ventricular prominence related to global parenchymal volume loss without hydrocephalus. No extra-axial fluid collection. Pituitary gland within normal limits. Vascular: Major  intracranial vascular flow voids are grossly maintained on this motion degraded exam. The right vertebral artery is not well seen. Skull and upper cervical spine:  Craniocervical junction within normal limits. Bone marrow signal intensity grossly normal. No scalp soft tissue abnormality. Sinuses/Orbits: Prior bilateral ocular lens replacement. Scattered mucosal thickening noted about the maxillary sinuses. Paranasal sinuses are otherwise largely clear. No significant mastoid effusion. Other: None. IMPRESSION: 1. Motion degraded exam. 2. No acute intracranial abnormality. 3. Age-related cerebral atrophy with moderate to advanced chronic microvascular ischemic disease, with multiple remote lacunar infarcts about the bilateral basal ganglia, thalami, and pons. Electronically Signed   By: Rise Mu M.D.   On: 02/29/2024 03:42   DG Chest Port 1 View Result Date: 02/29/2024 CLINICAL DATA:  Weakness EXAM: PORTABLE CHEST 1 VIEW COMPARISON:  10/13/2023 FINDINGS: Post sternotomy changes. Enlarged cardiomediastinal silhouette. Elevation of the right diaphragm. No acute airspace disease, pleural effusion or pneumothorax IMPRESSION: No active disease.  Cardiomegaly Electronically Signed   By: Jasmine Pang M.D.   On: 02/29/2024 00:05     LOS: 1 day   Jeoffrey Massed, MD  [facility]    To contact the attending provider between 7A-7P or the covering provider during after hours 7P-7A, please log into the web site [email or web address] and access using universal [place] password for that web site. If you do not have the password, please call the hospital operator.  03/01/2024, 10:03 AM

## 2024-03-01 NOTE — Evaluation (Signed)
 Physical Therapy Evaluation Patient Details Name: [name] [name] MRN: [id number] DOB: December 05, 1941 Today's Date: 03/01/2024  History of Present Illness  Pt is 83 year old presented to [facility] on  02/28/24 for weakness/lethargy and diarrhea. Pt with sepsis of unclear source and acue metabolic encephalopathy. 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  Clinical Impression  Pt admitted with above diagnosis and presents to PT with functional limitations due to deficits listed below (See PT problem list). Pt needs skilled PT to maximize independence and safety. Pt very weak requiring heavy assist to stand and unable to amb. Pt unable to tell me his prior level of function but I doubt he was moving this poorly. Patient will benefit from continued inpatient follow up therapy, <3 hours/day.           If plan is discharge home, recommend the following: Two people to help with walking and/or transfers;A lot of help with bathing/dressing/bathroom;Assistance with cooking/housework;Assist for transportation;Help with stairs or ramp for entrance   Can travel by private vehicle   No    Equipment Recommendations Other (comment) (to be determined)  Recommendations for Other Services       Functional Status Assessment Patient has had a recent decline in their functional status and demonstrates the ability to make significant improvements in function in a reasonable and predictable amount of time.     Precautions / Restrictions Precautions Precautions: Fall;Other (comment) Precaution/Restrictions Comments: enteric Restrictions Weight Bearing Restrictions Per Provider Order: No      Mobility  Bed Mobility Overal bed mobility: Needs Assistance Bed Mobility: Supine to Sit, Sit to Supine     Supine to sit: Mod assist, HOB elevated Sit to supine: Min assist   General bed mobility comments: Assist to bring legs off of bed, elevate trunk into sitting and  bring hips to EOB. Assist to bring legs back up into bed    Transfers Overall transfer level: Needs assistance Equipment used: Rolling walker (2 wheels) Transfers: Sit to/from Stand Sit to Stand: Max assist, From elevated surface           General transfer comment: Heavy assist to power up and stabilize.    Ambulation/Gait             Pre-gait activities: Very small shuffling side steps ~2' up side of bed    Stairs            Wheelchair Mobility     Tilt Bed    Modified Rankin (Stroke Patients Only)       Balance Overall balance assessment: Needs assistance Sitting-balance support: Bilateral upper extremity supported, Feet supported Sitting balance-Leahy Scale: Poor Sitting balance - Comments: UE support. 1 loss of balance posteriorly requiring min assist to correct   Standing balance support: Bilateral upper extremity supported, During functional activity, Reliant on assistive device for balance Standing balance-Leahy Scale: Poor Standing balance comment: Walker and mod assist for static standing                             Pertinent Vitals/Pain Pain Assessment Pain Assessment: No/denies pain    Home Living Family/patient expects to be discharged to:: Private residence Living Arrangements: Spouse/significant other                 Additional Comments: Pt unable to provide    Prior Function Prior Level of Function : Patient poor historian/Family not available  Extremity/Trunk Assessment   Upper Extremity Assessment Upper Extremity Assessment: Defer to OT evaluation    Lower Extremity Assessment Lower Extremity Assessment: Generalized weakness       Communication   Communication Communication: Impaired Factors Affecting Communication: Hearing impaired    Cognition Arousal: Alert Behavior During Therapy: WFL for tasks assessed/performed   PT - Cognitive impairments: No family/caregiver  present to determine baseline, Memory, Problem solving, Safety/Judgement, Attention, Initiation, Orientation, Awareness   Orientation impairments: Time, Situation                   PT - Cognition Comments: Slow to process. Poor awareness of deficits. Unable to provide prior level of function Following commands: Impaired Following commands impaired: Follows one step commands with increased time     Cueing Cueing Techniques: Verbal cues, Tactile cues     General Comments General comments (skin integrity, edema, etc.): VSS stable on RA    Exercises     Assessment/Plan    PT Assessment Patient needs continued PT services  PT Problem List Decreased strength;Decreased activity tolerance;Decreased balance;Decreased mobility;Decreased cognition       PT Treatment Interventions DME instruction;Gait training;Functional mobility training;Therapeutic activities;Therapeutic exercise;Balance training;Patient/family education    PT Goals (Current goals can be found in the Care Plan section)  Acute Rehab PT Goals Patient Stated Goal: Pt unable to state PT Goal Formulation: Patient unable to participate in goal setting Time For Goal Achievement: 03/15/24 Potential to Achieve Goals: Fair    Frequency Min 1X/week     Co-evaluation               AM-PAC PT "6 Clicks" Mobility  Outcome Measure Help needed turning from your back to your side while in a flat bed without using bedrails?: A Lot Help needed moving from lying on your back to sitting on the side of a flat bed without using bedrails?: A Lot Help needed moving to and from a bed to a chair (including a wheelchair)?: Total Help needed standing up from a chair using your arms (e.g., wheelchair or bedside chair)?: A Lot Help needed to walk in hospital room?: Total Help needed climbing 3-5 steps with a railing? : Total 6 Click Score: 9    End of Session   Activity Tolerance: Patient limited by fatigue Patient left: in  bed;with call bell/phone within reach;with bed alarm set;with nursing/sitter in room Nurse Communication: Mobility status;Other (comment) (left O2 off) PT Visit Diagnosis: Unsteadiness on feet (R26.81);Other abnormalities of gait and mobility (R26.89);Muscle weakness (generalized) (M62.81)    Time: 1410-1443 PT Time Calculation (min) (ACUTE ONLY): 33 min   Charges:   PT Evaluation $PT Eval Moderate Complexity: 1 Mod PT Treatments $Therapeutic Activity: 8-22 mins PT General Charges $$ ACUTE PT VISIT: 1 Visit         [facility] PT Acute Rehabilitation Services Office [phone]   Angelina Ok [facility] 03/01/2024, 4:14 PM

## 2024-03-02 DIAGNOSIS — Z515 Encounter for palliative care: Secondary | ICD-10-CM

## 2024-03-02 DIAGNOSIS — Z7189 Other specified counseling: Secondary | ICD-10-CM | POA: Diagnosis not present

## 2024-03-02 DIAGNOSIS — R531 Weakness: Secondary | ICD-10-CM | POA: Diagnosis not present

## 2024-03-02 LAB — PHOSPHORUS: Phosphorus: 1.8 mg/dL — ABNORMAL LOW (ref 2.5–4.6)

## 2024-03-02 LAB — CBC
HCT: 37.8 % — ABNORMAL LOW (ref 39.0–52.0)
Hemoglobin: 12.8 g/dL — ABNORMAL LOW (ref 13.0–17.0)
MCH: 34.6 pg — ABNORMAL HIGH (ref 26.0–34.0)
MCHC: 33.9 g/dL (ref 30.0–36.0)
MCV: 102.2 fL — ABNORMAL HIGH (ref 80.0–100.0)
Platelets: 219 10*3/uL (ref 150–400)
RBC: 3.7 MIL/uL — ABNORMAL LOW (ref 4.22–5.81)
RDW: 13.2 % (ref 11.5–15.5)
WBC: 6.5 10*3/uL (ref 4.0–10.5)
nRBC: 0 % (ref 0.0–0.2)

## 2024-03-02 LAB — GLUCOSE, CAPILLARY
Glucose-Capillary: 115 mg/dL — ABNORMAL HIGH (ref 70–99)
Glucose-Capillary: 97 mg/dL (ref 70–99)
Glucose-Capillary: 154 mg/dL — ABNORMAL HIGH (ref 70–99)
Glucose-Capillary: 134 mg/dL — ABNORMAL HIGH (ref 70–99)
Glucose-Capillary: 91 mg/dL (ref 70–99)
Glucose-Capillary: 113 mg/dL — ABNORMAL HIGH (ref 70–99)

## 2024-03-02 LAB — BASIC METABOLIC PANEL
Anion gap: 9 (ref 5–15)
BUN: 20 mg/dL (ref 8–23)
CO2: 23 mmol/L (ref 22–32)
Calcium: 7.7 mg/dL — ABNORMAL LOW (ref 8.9–10.3)
Chloride: 107 mmol/L (ref 98–111)
Creatinine, Ser: 1 mg/dL (ref 0.61–1.24)
GFR, Estimated: >60 mL/min (ref >60)
Glucose, Bld: 112 mg/dL — ABNORMAL HIGH (ref 70–99)
Potassium: 3.3 mmol/L — ABNORMAL LOW (ref 3.5–5.1)
Sodium: 139 mmol/L (ref 135–145)

## 2024-03-02 LAB — MAGNESIUM: Magnesium: 2.1 mg/dL (ref 1.7–2.4)

## 2024-03-02 MED ORDER — POTASSIUM CHLORIDE CRYS ER 20 MEQ PO TBCR
40.0000 meq | EXTENDED_RELEASE_TABLET | Freq: Once | ORAL | Status: AC
  Administered 2024-03-02: 40 meq via ORAL
  Filled 2024-03-02: qty 2

## 2024-03-02 MED ORDER — POTASSIUM PHOSPHATES 15 MMOLE/5ML IV SOLN
30.0000 mmol | Freq: Once | INTRAVENOUS | Status: AC
  Administered 2024-03-02: 30 mmol via INTRAVENOUS
  Filled 2024-03-02: qty 10

## 2024-03-02 MED ORDER — CHLORHEXIDINE GLUCONATE CLOTH 2 % EX PADS
6.0000 | MEDICATED_PAD | Freq: Every day | CUTANEOUS | Status: DC
  Administered 2024-03-02 – 2024-03-03 (×2): 6 via TOPICAL

## 2024-03-02 NOTE — Progress Notes (Signed)
 [name] [name] [facility] 1O10 [facility] Liaison Note   [name] [name] is a current hospice patient with a terminal diagnosis of Cerebrovascular Disease. Family contacted EMS to assist with getting patient from chair to bed. Upon arrival, EMS noted that patient had a low-grade fever, dark cloudy urine, tachycardic and was hypotensive. Family reported that patient had been weak, not eating or drinking all day. [facility] notified of patient condition and family requested evaluation at hospital. Patient was admitted to [facility] on 02/29/24 with diagnosis of Sepsis. Per Dr. Gordy Savers with [facility], this is a related hospital admission.    Patient resting quietly today without sign or symptom of distress. Both he and his family have discussed and desire to likely pursue rehab at discharge to attempt to regain some of his strength. He continues to receive treatment for infection and to treat electrolyte abnormalities.  He remains inpatient appropriate with continued need for IV antibiotics and electrolyte replacement   VS: 98.2/80/24   140/68     spO2 94% on room air   I/O: not recorded/300   Abnormal Labs: K+ 3.3, Ca+ 7.7, Phos 1.8, RBC 3.70, Hgb 12.8, Hct 37.8   Diagnostics: None new   IV/PRN meds: Rocephin 2g IV q24H, Potassium Phosphate in D5 IV once   Assessment as per Progress Note Dr. Jeoffrey Massed 3.3.25 Sepsis Likely due to norovirus related gastroenteritis Stop Rocephin today As needed Imodium Supportive care   Acute metabolic encephalopathy Likely secondary to sepsis/dehydration from diarrhea Improved with IV fluids/supportive care. Neuroimaging negative.   AKI Likely due to urinary retention Mild Has resolved with Foley catheter placement   Acute urinary retention Continue Flomax Continue Foley Will attempt a voiding trial 3/4.   Hypokalemia Secondary to diarrhea Replete/recheck   Hypomagnesemia Repleted    Hypophosphatemia Replete/recheck    Discharge Planning:  Ongoing, home vs revocation for rehab    Family contact: Talked with daughter by phone     IDT: updated   Goals of Care: Full code   Please call with any hospice related questions or concerns.   Thea Gist, BSN, [facility] Liaison [phone]

## 2024-03-02 NOTE — Evaluation (Signed)
 Occupational Therapy Evaluation Patient Details Name: [name] [name] MRN: [id number] DOB: 1941-09-17 Today's Date: 03/02/2024   History of Present Illness   Pt is 83 year old presented to [facility] on  02/28/24 for weakness/lethargy and diarrhea. Pt with sepsis of unclear source and acue metabolic encephalopathy. 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     Clinical Impressions Prior to this admission, patient unsure of where he was living, stating he was at a respite place till his wife got out of the hospital. Patient tangential with all speech, and poor historian. Currently, patient with cognitive impairments, weakness, and need for increased assist to complete all ADLs and functional mobility. Patient able to sit EOB with min A for a number of minutes, however unable to progress to standing due to bowel urgency (rectal tube in place throughout session). Patient mod A for ADLs, and would benefit from +2 for OOB activity. OT recommending rehab at a lesser intensive facility at discharge, OT will continue to follow acutely.      If plan is discharge home, recommend the following:   Two people to help with walking and/or transfers;A lot of help with bathing/dressing/bathroom;Assistance with cooking/housework;Direct supervision/assist for medications management;Direct supervision/assist for financial management;Assist for transportation;Help with stairs or ramp for entrance;Supervision due to cognitive status     Functional Status Assessment   Patient has had a recent decline in their functional status and demonstrates the ability to make significant improvements in function in a reasonable and predictable amount of time.     Equipment Recommendations   Other (comment) (defer to next venue)     Recommendations for Other Services         Precautions/Restrictions   Precautions Precautions: Fall;Other (comment) Recall of  Precautions/Restrictions: Impaired Precaution/Restrictions Comments: enteric Restrictions Weight Bearing Restrictions Per Provider Order: No     Mobility Bed Mobility Overal bed mobility: Needs Assistance Bed Mobility: Supine to Sit, Sit to Supine     Supine to sit: Supervision, HOB elevated, Used rails Sit to supine: Min assist   General bed mobility comments: Min A to return legs back to bed    Transfers Overall transfer level: Needs assistance                 General transfer comment: unable to assess due to bowel urgency      Balance Overall balance assessment: Needs assistance Sitting-balance support: Bilateral upper extremity supported, Feet supported Sitting balance-Leahy Scale: Fair Sitting balance - Comments: able to sit EOB for 15+ minutes with supervision                                   ADL either performed or assessed with clinical judgement   ADL Overall ADL's : Needs assistance/impaired Eating/Feeding: Set up;Sitting   Grooming: Wash/dry face;Set up;Wash/dry hands;Sitting   Upper Body Bathing: Sitting;Contact guard assist   Lower Body Bathing: Maximal assistance;Sitting/lateral leans;Sit to/from stand   Upper Body Dressing : Contact guard assist;Sitting   Lower Body Dressing: Maximal assistance;Sit to/from stand;Sitting/lateral leans     Toilet Transfer Details (indicate cue type and reason): unable to assess this session Toileting- Clothing Manipulation and Hygiene: Total assistance Toileting - Clothing Manipulation Details (indicate cue type and reason): rectal tube and foley     Functional mobility during ADLs: Moderate assistance;+2 for safety/equipment;+2 for physical assistance General ADL Comments: Prior to this admission, patient unsure  of where he was living, stating he was at a respite place till his wife got out of the hospital. Patient tangential with all speech, and poor historian. Currently, patient with  cognitive impairments, weakness, and need for increased assist to complete all ADLs and functional mobility. Patient able to sit EOB with min A for a number of minutes, however unable to progress to standing due to bowel urgency (rectal tube in place throughout session). Patient mod A for ADLs, and would benefit from +2 for OOB activity. OT recommending rehab at a lesser intensive facility at discharge, OT will continue to follow acutely.     Vision   Additional Comments: unable to assess due to decreased cognition, will continue to assess     Perception Perception: Not tested       Praxis Praxis: Not tested       Pertinent Vitals/Pain Pain Assessment Pain Assessment: Faces Faces Pain Scale: No hurt     Extremity/Trunk Assessment Upper Extremity Assessment Upper Extremity Assessment: Generalized weakness;Left hand dominant;RUE deficits/detail;LUE deficits/detail RUE Deficits / Details: decreased shoulder flexion and weakness, potential arthritis, unable to lift past 90 degrees RUE Sensation: WNL RUE Coordination: decreased gross motor LUE Deficits / Details: decreased shoulder flexion and weakness, potential arthritis, unable to lift past 90 degrees LUE Coordination: decreased gross motor   Lower Extremity Assessment Lower Extremity Assessment: Defer to PT evaluation   Cervical / Trunk Assessment Cervical / Trunk Assessment: Kyphotic   Communication Communication Communication: Impaired Factors Affecting Communication: Hearing impaired   Cognition Arousal: Alert Behavior During Therapy: WFL for tasks assessed/performed Cognition: No family/caregiver present to determine baseline, Cognition impaired     Awareness: Intellectual awareness intact, Online awareness impaired Memory impairment (select all impairments): Short-term memory, Working memory Attention impairment (select first level of impairment): Focused attention Executive functioning impairment (select all  impairments): Initiation, Organization, Sequencing, Reasoning, Problem solving OT - Cognition Comments: Patient tangential with all speech, taking an excessive numbers of minutes in order to answer a question, poor historian as to where he has been residing or what he has been able to do                 Following commands: Impaired Following commands impaired: Follows one step commands with increased time     Cueing  General Comments   Cueing Techniques: Verbal cues;Tactile cues  VSS on RA   Exercises     Shoulder Instructions      Home Living Family/patient expects to be discharged to:: Private residence Living Arrangements: Spouse/significant other Available Help at Discharge: Available PRN/intermittently Type of Home: House Home Access: Stairs to enter Secretary/administrator of Steps: 3 Entrance Stairs-Rails: Right Home Layout: One level     Bathroom Shower/Tub: Tub/shower unit             Additional Comments: Patient inconsistent with answers, unsure if information is accurate      Prior Functioning/Environment Prior Level of Function : Patient poor historian/Family not available             Mobility Comments: patient stating he used a walker, but truly inconsistent, then stating hes been bed bound      OT Problem List: Decreased strength;Decreased range of motion;Decreased activity tolerance;Impaired balance (sitting and/or standing);Decreased coordination;Decreased cognition;Decreased safety awareness;Decreased knowledge of use of DME or AE;Decreased knowledge of precautions;Impaired UE functional use   OT Treatment/Interventions: Self-care/ADL training;Therapeutic exercise;Energy conservation;DME and/or AE instruction;Manual therapy;Therapeutic activities;Cognitive remediation/compensation;Patient/family education;Balance training      OT Goals(Current  goals can be found in the care plan section)   Acute Rehab OT Goals Patient Stated Goal:  unable to state OT Goal Formulation: Patient unable to participate in goal setting Time For Goal Achievement: 03/16/24 Potential to Achieve Goals: Fair ADL Goals Pt Will Perform Lower Body Bathing: with mod assist;sitting/lateral leans;sit to/from stand Pt Will Perform Lower Body Dressing: with mod assist;sit to/from stand;sitting/lateral leans Pt Will Transfer to Toilet: with mod assist;stand pivot transfer;bedside commode Pt Will Perform Toileting - Clothing Manipulation and hygiene: with mod assist;sitting/lateral leans;sit to/from stand Additional ADL Goal #1: Patient will be able to follow 2 step commands consistently as a precursor for higher level cognitive tasks.   OT Frequency:  Min 1X/week    Co-evaluation              AM-PAC OT "6 Clicks" Daily Activity     Outcome Measure Help from another person eating meals?: A Little Help from another person taking care of personal grooming?: A Little Help from another person toileting, which includes using toliet, bedpan, or urinal?: A Lot Help from another person bathing (including washing, rinsing, drying)?: A Lot Help from another person to put on and taking off regular upper body clothing?: A Little Help from another person to put on and taking off regular lower body clothing?: A Lot 6 Click Score: 15   End of Session Nurse Communication: Mobility status  Activity Tolerance: Patient tolerated treatment well Patient left: in bed;with call bell/phone within reach;with bed alarm set  OT Visit Diagnosis: Unsteadiness on feet (R26.81);Other abnormalities of gait and mobility (R26.89);Muscle weakness (generalized) (M62.81);History of falling (Z91.81);Other symptoms and signs involving cognitive function                Time: 1410-1447 OT Time Calculation (min): 37 min Charges:  OT General Charges $OT Visit: 1 Visit OT Evaluation $OT Eval Moderate Complexity: 1 Mod OT Treatments $Self Care/Home Management : 8-22 mins  Pollyann Glen E. Claudie Brickhouse, OTR/L Acute Rehabilitation Services [phone]   Cherlyn Cushing 03/02/2024, 3:19 PM

## 2024-03-02 NOTE — [place] FL2 (Signed)
 [place] MEDICAID FL2 LEVEL OF CARE FORM     IDENTIFICATION  Patient Name: [name] [name] Birthdate: 03-24-1941 Sex: male Admission Date (Current Location): 02/28/2024  [facility] and [place] Number:  Producer, television/film/video and Address:  [facility], [address], [place], [place] [address]      Provider Number: [id number]  Attending Physician Name and Address:  Maretta Bees, MD  Relative Name and Phone Number:       Current Level of Care: Hospital Recommended Level of Care: Skilled Nursing Facility Prior Approval Number:    Date Approved/Denied:   PASRR Number: [id number]  Discharge Plan: SNF    Current Diagnoses: Patient Active Problem List   Diagnosis Date Noted   Sepsis (HCC) 02/29/2024   Acute urinary retention 02/29/2024   AKI (acute kidney injury) (HCC) 02/29/2024   Dysarthria 02/29/2024   Chronic combined systolic and diastolic CHF, NYHA class 1 (HCC) 02/29/2024   History of CAD (coronary artery disease) 02/29/2024   Generalized weakness 02/29/2024   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   Macrocytosis 05/30/2023   Chronic diarrhea 05/29/2023   Preventative health care 07/10/2022   Depression 03/22/2022   History of TIA (transient ischemic attack) 02/28/2022   Pain due to onychomycosis of toenails of both feet 02/13/2022   Blood clotting disorder (HCC) 02/13/2022   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   TIA (transient ischemic attack) 04/03/2021   Lower extremity edema 08/13/2019   Unsteady gait 06/09/2019   Trigger ring finger of right hand 06/02/2018   Ceruminosis, bilateral 09/16/2017   Hyperlipidemia 01/30/2016   Carotid stenosis 08/26/2015   Anxiety, 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   Carotid artery disease (HCC) 07/01/2012   Special screening for malignant neoplasm of prostate 05/08/2011   Vitamin D deficiency 11/20/2009   Transient cerebral ischemia 06/30/2009   Recurrent strokes (HCC) 06/30/2009   COLONIC POLYPS  01/10/2008   Essential hypertension 01/10/2008   Coronary atherosclerosis 01/10/2008   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   GYNECOMASTIA, UNILATERAL 01/10/2008   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   SHOULDER PAIN 01/10/2008   Non-insulin dependent type 2 diabetes mellitus (HCC) 01/10/2008    Orientation RESPIRATION BLADDER Height & Weight     Self, Place  Normal Continent, Indwelling catheter Weight: 208 lb 15.9 oz (94.8 kg) (Wt from 11/2023) Height:  6' (182.9 cm)  BEHAVIORAL SYMPTOMS/MOOD NEUROLOGICAL BOWEL NUTRITION STATUS      Incontinent Diet (See dc summary)  AMBULATORY STATUS COMMUNICATION OF NEEDS Skin   Extensive Assist Verbally PU Stage and Appropriate Care (Stage I on buttocks)                       Personal Care Assistance Level of Assistance  Bathing, Feeding, Dressing Bathing Assistance: Maximum assistance Feeding assistance: Maximum assistance Dressing Assistance: Maximum assistance     Functional Limitations Info             SPECIAL CARE FACTORS FREQUENCY  PT (By licensed PT), OT (By licensed OT)     PT Frequency: 5x/week OT Frequency: 5x/week            Contractures Contractures Info: Not present    Additional Factors Info  Code Status, Allergies, Insulin Sliding Scale, Isolation Precautions Code Status Info: Full Allergies Info: Niacin, Ativan (Lorazepam)   Insulin Sliding Scale Info: See dc summary Isolation Precautions Info: Norovirus     Current Medications (03/02/2024):  This is the current hospital active medication list Current Facility-Administered Medications  Medication Dose Route Frequency Provider Last Rate Last Admin   acetaminophen (TYLENOL) tablet 650 mg  650 mg Oral Q6H PRN Mdala-Gausi, Gwenette Greet, MD       Or   acetaminophen (TYLENOL) suppository 650 mg  650 mg Rectal Q6H PRN Mdala-Gausi, Gwenette Greet, MD       atorvastatin (LIPITOR) tablet 10 mg  10 mg Oral Daily Sundil, Subrina, MD   10 mg  at 03/02/24 1042   clopidogrel (PLAVIX) tablet 75 mg  75 mg Oral Daily Sundil, Subrina, MD   75 mg at 03/02/24 1045   dextrose 50 % solution 25 g  25 g Intravenous PRN Mdala-Gausi, Gwenette Greet, MD       enoxaparin (LOVENOX) injection 40 mg  40 mg Subcutaneous Q24H Mdala-Gausi, Masiku Agatha, MD   40 mg at 03/01/24 1441   food thickener (SIMPLYTHICK (NECTAR/LEVEL 2/MILDLY THICK)) 1 packet  1 packet Oral PRN Maretta Bees, MD       insulin aspart (novoLOG) injection 0-9 Units  0-9 Units Subcutaneous TID WC Maretta Bees, MD   1 Units at 03/01/24 1824   ondansetron (ZOFRAN) tablet 4 mg  4 mg Oral Q6H PRN Janalyn Shy, Subrina, MD       Or   ondansetron [facility]) injection 4 mg  4 mg Intravenous Q6H PRN Sundil, Subrina, MD       potassium PHOSPHATE 30 mmol in dextrose 5 % 250 mL infusion  30 mmol Intravenous Once Maretta Bees, MD 43 mL/hr at 03/02/24 1051 30 mmol at 03/02/24 1051   sodium chloride flush (NS) 0.9 % injection 3 mL  3 mL Intravenous Q12H Sundil, Subrina, MD   3 mL at 03/02/24 1054   sodium chloride flush (NS) 0.9 % injection 3 mL  3 mL Intravenous PRN Sundil, Subrina, MD       tamsulosin (FLOMAX) capsule 0.4 mg  0.4 mg Oral Daily Maretta Bees, MD   0.4 mg at 03/02/24 1045     Discharge Medications: Please see discharge summary for a list of discharge medications.  Relevant Imaging Results:  Relevant Lab Results:   Additional Information SSN: [id number]. Outpatient palliative to follow at SNF.   Mearl Latin, LCSW

## 2024-03-02 NOTE — Progress Notes (Signed)
 PROGRESS NOTE        PATIENT DETAILS Name: [name] [name] Age: 83 y.o. Sex: male Date of Birth: Jul 16, 1941 Admit Date: 02/28/2024 Admitting Physician Tereasa Coop, MD ZOX:[id number], Keane Scrape, NP  Brief Summary: Patient is a 83 y.o.  male with history of hereditary hemochromatosis, DM-2, HTN, HLD, CAD, chronic combined HFpEF/HFrEF, TIA-followed at home by hospice-presented with generalized weakness/lethargy.  Significant events: 2/28>> admit to [facility]  Significant studies: 2/28>> MRI brain: No acute intracranial abnormality. 2/28>> CXR: No PNA.  Significant microbiology data: 3/01>> COVID/influenza/RSV PCR: Negative 3/01>> stool C. difficile studies: Negative 3/01>> GI pathogen panel: Norovirus 3/01>> blood culture: No growth  Procedures: None  Consults: Palliative care  Subjective: No major issues overnight-lying comfortably in bed.  Diarrhea slowed down quite a bit.  Objective: Vitals: Blood pressure (!) 154/70, pulse 71, temperature 97.6 F (36.4 C), temperature source Oral, resp. rate (!) 25, height 6' (1.829 m), weight 94.8 kg, SpO2 93%.   Exam: Gen Exam:Alert awake-not in any distress HEENT:atraumatic, normocephalic Chest: B/L clear to auscultation anteriorly CVS:S1S2 regular Abdomen:soft non tender, non distended Extremities:no edema Neurology: Non focal Skin: no rash  Pertinent Labs/Radiology:    Latest Ref Rng & Units 03/02/2024    5:51 AM 03/01/2024    6:02 AM 02/29/2024    3:38 AM  CBC  WBC 4.0 - 10.5 K/uL 6.5  11.1  24.5   Hemoglobin 13.0 - 17.0 g/dL 54.0  98.1  19.1   Hematocrit 39.0 - 52.0 % 37.8  40.7  44.9   Platelets 150 - 400 K/uL 219  241  351     Lab Results  Component Value Date   NA 139 03/02/2024   K 3.3 (L) 03/02/2024   CL 107 03/02/2024   CO2 23 03/02/2024     Assessment/Plan: Sepsis Likely due to norovirus related gastroenteritis Stop Rocephin today As needed Imodium Supportive care  Acute  metabolic encephalopathy Likely secondary to sepsis/dehydration from diarrhea Improved with IV fluids/supportive care. Neuroimaging negative.  AKI Likely due to urinary retention Mild Has resolved with Foley catheter placement  Acute urinary retention Continue Flomax Continue Foley Will attempt a voiding trial 3/4.  Hypokalemia Secondary to diarrhea Replete/recheck  Hypomagnesemia Repleted  Hypophosphatemia Replete/recheck  CAD No anginal symptoms Plavix/statin  Chronic HFpEF Euvolemic on exam-some chronic leg edema Lasix on hold  HLD Statin  DM-2 (A1c 7.2 on 05/29/2023) SSI   Recent Labs    03/01/24 2334 03/02/24 0347 03/02/24 0851  GLUCAP 113* 115* 97     Hereditary hemochromatosis Reviewed last oncology/hematology note-no further therapeutic phlebotomy planned Has been established with home hospice  Debility/deconditioning Per daughter-significant decline in function over the past 4 weeks-before this he was able to ambulate with the help of walker PT/OT eval SNF on discharge.  Goals of care Full code Follows with home hospice in the outpatient setting Frail/debilitated-do not think he is a good candidate for aggressive care if he were to deteriorate/decompensate Palliative care following-will await further recommendations.  Pressure Ulcer: Agree with assessment and plan as outlined below. Pressure Injury 02/29/24 Buttocks Right;Left;Mid Stage 1 -  Intact skin with non-blanchable redness of a localized area usually over a bony prominence. (Active)  02/29/24 1500  Location: Buttocks  Location Orientation: Right;Left;Mid  Staging: Stage 1 -  Intact skin with non-blanchable redness of a localized area usually over a bony  prominence.  Wound Description (Comments):   Present on Admission: Yes  Dressing Type Foam - Lift dressing to assess site every shift 03/02/24 0835    BMI: Estimated body mass index is 28.34 kg/m as calculated from the  following:   Height as of this encounter: 6' (1.829 m).   Weight as of this encounter: 94.8 kg.   Code status:   Code Status: Full Code   DVT Prophylaxis: enoxaparin (LOVENOX) injection 40 mg Start: 02/29/24 1445 SCDs Start: 02/29/24 0124 Place TED hose Start: 02/29/24 0124    Family Communication: Daugther-[phone] updated 3/3   Disposition Plan: Status is: Inpatient Remains inpatient appropriate because: Severity of illness   Planned Discharge Destination:Hospice care   Diet: Diet Order             DIET DYS 3 Room service appropriate? Yes; Fluid consistency: Nectar Thick  Diet effective now                     Antimicrobial agents: Anti-infectives (From admission, onward)    Start     Dose/Rate Route Frequency Ordered Stop   03/01/24 1100  cefTRIAXone (ROCEPHIN) 2 g in sodium chloride 0.9 % 100 mL IVPB        2 g 200 mL/hr over 30 Minutes Intravenous Every 24 hours 03/01/24 1011     02/29/24 1300  vancomycin (VANCOREADY) IVPB 750 mg/150 mL  Status:  Discontinued        750 mg 150 mL/hr over 60 Minutes Intravenous Every 12 hours 02/29/24 0216 03/01/24 1007   02/29/24 1100  ceFEPIme (MAXIPIME) 2 g in sodium chloride 0.9 % 100 mL IVPB  Status:  Discontinued        2 g 200 mL/hr over 30 Minutes Intravenous Every 12 hours 02/29/24 0216 03/01/24 1010   02/29/24 0000  vancomycin (VANCOREADY) IVPB 2000 mg/400 mL        2,000 mg 200 mL/hr over 120 Minutes Intravenous  Once 02/28/24 2357 02/29/24 0242   02/28/24 2345  ceFEPIme (MAXIPIME) 2 g in sodium chloride 0.9 % 100 mL IVPB        2 g 200 mL/hr over 30 Minutes Intravenous  Once 02/28/24 2343 02/29/24 0020   02/28/24 2345  metroNIDAZOLE (FLAGYL) IVPB 500 mg        500 mg 100 mL/hr over 60 Minutes Intravenous  Once 02/28/24 2343 02/29/24 0135   02/28/24 2345  vancomycin (VANCOCIN) IVPB 1000 mg/200 mL premix  Status:  Discontinued        1,000 mg 200 mL/hr over 60 Minutes Intravenous  Once 02/28/24  2343 02/28/24 2357        MEDICATIONS: Scheduled Meds:  atorvastatin  10 mg Oral Daily   clopidogrel  75 mg Oral Daily   enoxaparin (LOVENOX) injection  40 mg Subcutaneous Q24H   insulin aspart  0-9 Units Subcutaneous TID WC   sodium chloride flush  3 mL Intravenous Q12H   tamsulosin  0.4 mg Oral Daily   Continuous Infusions:  cefTRIAXone (ROCEPHIN)  IV 2 g (03/02/24 1057)   potassium PHOSPHATE IVPB (in mmol) 30 mmol (03/02/24 1051)   PRN Meds:.acetaminophen **OR** acetaminophen, dextrose, food thickener, ondansetron **OR** ondansetron (ZOFRAN) IV, sodium chloride flush   I have personally reviewed following labs and imaging studies  LABORATORY DATA: CBC: Recent Labs  Lab 02/28/24 2214 02/29/24 0338 03/01/24 0602 03/02/24 0551  WBC 26.1* 24.5* 11.1* 6.5  NEUTROABS 22.8*  --   --   --  HGB 15.0 14.8 13.5 12.8*  HCT 43.4 44.9 40.7 37.8*  MCV 101.4* 103.9* 101.8* 102.2*  PLT 370 351 241 219    Basic Metabolic Panel: Recent Labs  Lab 02/28/24 2214 02/29/24 0338 03/01/24 0602 03/02/24 0551  NA 138 138 138 139  K 3.4* 3.5 2.8* 3.3*  CL 102 103 108 107  CO2 22 23 21* 23  GLUCOSE 227* 138* 135* 112*  BUN 15 16 21 20   CREATININE 1.38* 1.23 1.12 1.00  CALCIUM 8.1* 7.9* 7.6* 7.7*  MG  --   --  1.7 2.1  PHOS  --   --  3.0 1.8*    GFR: Estimated Creatinine Clearance: 70.4 mL/min (by C-G formula based on SCr of 1 mg/dL).  Liver Function Tests: Recent Labs  Lab 02/29/24 0014 02/29/24 0338  AST 41 47*  ALT 35 40  ALKPHOS 233* 271*  BILITOT 0.6 1.0  PROT 5.2* 6.1*  ALBUMIN 2.0* 2.5*   No results for input(s): "LIPASE", "AMYLASE" in the last 168 hours. No results for input(s): "AMMONIA" in the last 168 hours.  Coagulation Profile: Recent Labs  Lab 02/29/24 0014  INR 1.1    Cardiac Enzymes: No results for input(s): "CKTOTAL", "CKMB", "CKMBINDEX", "TROPONINI" in the last 168 hours.  BNP (last 3 results) Recent Labs    10/28/23 1215  PROBNP 711*     Lipid Profile: No results for input(s): "CHOL", "HDL", "LDLCALC", "TRIG", "CHOLHDL", "LDLDIRECT" in the last 72 hours.  Thyroid Function Tests: No results for input(s): "TSH", "T4TOTAL", "FREET4", "T3FREE", "THYROIDAB" in the last 72 hours.  Anemia Panel: No results for input(s): "VITAMINB12", "FOLATE", "FERRITIN", "TIBC", "IRON", "RETICCTPCT" in the last 72 hours.  Urine analysis:    Component Value Date/Time   COLORURINE AMBER (A) 02/29/2024 0859   APPEARANCEUR CLOUDY (A) 02/29/2024 0859   LABSPEC 1.016 02/29/2024 0859   PHURINE 6.0 02/29/2024 0859   GLUCOSEU NEGATIVE 02/29/2024 0859   GLUCOSEU 100 mg/dL (A) 29/56/2130 8657   HGBUR SMALL (A) 02/29/2024 0859   BILIRUBINUR NEGATIVE 02/29/2024 0859   KETONESUR NEGATIVE 02/29/2024 0859   PROTEINUR NEGATIVE 02/29/2024 0859   UROBILINOGEN 0.2 08/18/2015 0905   NITRITE NEGATIVE 02/29/2024 0859   LEUKOCYTESUR LARGE (A) 02/29/2024 0859    Sepsis Labs: Lactic Acid, Venous    Component Value Date/Time   LATICACIDVEN 3.0 (HH) 02/29/2024 0859    MICROBIOLOGY: Recent Results (from the past 240 hours)  Resp panel by RT-PCR (RSV, Flu A&B, Covid) Anterior Nasal Swab     Status: None   Collection Time: 02/29/24 12:14 AM   Specimen: Anterior Nasal Swab  Result Value Ref Range Status   SARS Coronavirus 2 by RT PCR NEGATIVE NEGATIVE Final   Influenza A by PCR NEGATIVE NEGATIVE Final   Influenza B by PCR NEGATIVE NEGATIVE Final    Comment: (NOTE) The Xpert Xpress SARS-CoV-2/FLU/RSV plus assay is intended as an aid in the diagnosis of influenza from Nasopharyngeal swab specimens and should not be used as a sole basis for treatment. Nasal washings and aspirates are unacceptable for Xpert Xpress SARS-CoV-2/FLU/RSV testing.  Fact Sheet for Patients: BloggerCourse.com  Fact Sheet for Healthcare Providers: SeriousBroker.it  This test is not yet approved or cleared by the [place] FDA and has been authorized for detection and/or diagnosis of SARS-CoV-2 by FDA under an Emergency Use Authorization (EUA). This EUA will remain in effect (meaning this test can be used) for the duration of the COVID-19 declaration under Section 564(b)(1) of the Act, 21 U.S.C. section  360bbb-3(b)(1), unless the authorization is terminated or revoked.     Resp Syncytial Virus by PCR NEGATIVE NEGATIVE Final    Comment: (NOTE) Fact Sheet for Patients: BloggerCourse.com  Fact Sheet for Healthcare Providers: SeriousBroker.it  This test is not yet approved or cleared by the [place] FDA and has been authorized for detection and/or diagnosis of SARS-CoV-2 by FDA under an Emergency Use Authorization (EUA). This EUA will remain in effect (meaning this test can be used) for the duration of the COVID-19 declaration under Section 564(b)(1) of the Act, 21 U.S.C. section 360bbb-3(b)(1), unless the authorization is terminated or revoked.  Performed at [facility], [address] [place], [place] [address]   Blood Culture (routine x 2)     Status: None (Preliminary result)   Collection Time: 02/29/24 12:14 AM   Specimen: BLOOD RIGHT HAND  Result Value Ref Range Status   Specimen Description BLOOD RIGHT HAND  Final   Special Requests   Final    BOTTLES DRAWN AEROBIC AND ANAEROBIC Blood Culture adequate volume   Culture   Final    NO GROWTH 2 DAYS Performed at [facility], [address] [place], [place] [address]    Report Status PENDING  Incomplete  Blood Culture (routine x 2)     Status: None (Preliminary result)   Collection Time: 02/29/24 12:14 AM   Specimen: BLOOD  Result Value Ref Range Status   Specimen Description BLOOD RIGHT ANTECUBITAL  Final   Special Requests   Final    BOTTLES DRAWN AEROBIC AND ANAEROBIC Blood Culture results may not be optimal due to an inadequate volume of blood received in  culture bottles   Culture   Final    NO GROWTH 2 DAYS Performed at [facility], [address], [place], [place] [address]    Report Status PENDING  Incomplete  Urine Culture     Status: Abnormal   Collection Time: 02/29/24  8:59 AM   Specimen: Urine, Random  Result Value Ref Range Status   Specimen Description URINE, RANDOM  Final   Special Requests   Final    NONE Reflexed from [id number] Performed at [facility], [address], [place], [place] [address]    Culture MULTIPLE SPECIES PRESENT, SUGGEST RECOLLECTION (A)  Final   Report Status 03/01/2024 FINAL  Final  Gastrointestinal Panel by PCR , Stool     Status: Abnormal   Collection Time: 02/29/24  1:14 PM   Specimen: Stool  Result Value Ref Range Status   Campylobacter species NOT DETECTED NOT DETECTED Final   Plesimonas shigelloides NOT DETECTED NOT DETECTED Final   Salmonella species NOT DETECTED NOT DETECTED Final   Yersinia enterocolitica NOT DETECTED NOT DETECTED Final   Vibrio species NOT DETECTED NOT DETECTED Final   Vibrio cholerae NOT DETECTED NOT DETECTED Final   Enteroaggregative E coli (EAEC) NOT DETECTED NOT DETECTED Final   Enteropathogenic E coli (EPEC) NOT DETECTED NOT DETECTED Final   Enterotoxigenic E coli (ETEC) NOT DETECTED NOT DETECTED Final   Shiga like toxin producing E coli (STEC) NOT DETECTED NOT DETECTED Final   Shigella/Enteroinvasive E coli (EIEC) NOT DETECTED NOT DETECTED Final   Cryptosporidium NOT DETECTED NOT DETECTED Final   Cyclospora cayetanensis NOT DETECTED NOT DETECTED Final   Entamoeba histolytica NOT DETECTED NOT DETECTED Final   Giardia lamblia NOT DETECTED NOT DETECTED Final   Adenovirus F40/41 NOT DETECTED NOT DETECTED Final   Astrovirus NOT DETECTED NOT DETECTED  Final   Norovirus GI/GII DETECTED (A) NOT DETECTED Final    Comment: RESULT CALLED TO, READ BACK BY AND VERIFIED WITH: [facility] MANUEL AT 1509 03/01/24.PMF    Rotavirus A NOT DETECTED NOT DETECTED Final    Sapovirus (I, II, IV, and V) NOT DETECTED NOT DETECTED Final    Comment: Performed at [facility], [address] [place], [place] [address]  C Difficile Quick Screen w PCR reflex     Status: None   Collection Time: 02/29/24  1:14 PM   Specimen: Stool  Result Value Ref Range Status   C Diff antigen NEGATIVE NEGATIVE Final   C Diff toxin NEGATIVE NEGATIVE Final   C Diff interpretation No C. difficile detected.  Final    Comment: Performed at [facility], [address], [place], [place] [address]    RADIOLOGY STUDIES/RESULTS: No results found.    LOS: 2 days   Jeoffrey Massed, MD  [facility]    To contact the attending provider between 7A-7P or the covering provider during after hours 7P-7A, please log into the web site [email or web address] and access using universal [place] password for that web site. If you do not have the password, please call the hospital operator.  03/02/2024, 11:52 AM

## 2024-03-02 NOTE — Plan of Care (Signed)
  Problem: Education: Goal: Knowledge of General Education information will improve Description: Including pain rating scale, medication(s)/side effects and non-pharmacologic comfort measures Outcome: Progressing   Problem: Health Behavior/Discharge Planning: Goal: Ability to manage health-related needs will improve Outcome: Progressing   Problem: Clinical Measurements: Goal: Ability to maintain clinical measurements within normal limits will improve Outcome: Progressing Goal: Will remain free from infection Outcome: Progressing Goal: Diagnostic test results will improve Outcome: Progressing Goal: Respiratory complications will improve Outcome: Progressing Goal: Cardiovascular complication will be avoided Outcome: Progressing   Problem: Activity: Goal: Risk for activity intolerance will decrease Outcome: Progressing   Problem: Nutrition: Goal: Adequate nutrition will be maintained Outcome: Progressing   Problem: Coping: Goal: Level of anxiety will decrease Outcome: Progressing   Problem: Pain Managment: Goal: General experience of comfort will improve and/or be controlled Outcome: Progressing   Problem: Safety: Goal: Ability to remain free from injury will improve Outcome: Progressing   Problem: Fluid Volume: Goal: Ability to maintain a balanced intake and output will improve Outcome: Progressing   Problem: Health Behavior/Discharge Planning: Goal: Ability to identify and utilize available resources and services will improve Outcome: Progressing Goal: Ability to manage health-related needs will improve Outcome: Progressing   Problem: Metabolic: Goal: Ability to maintain appropriate glucose levels will improve Outcome: Progressing   Problem: Skin Integrity: Goal: Risk for impaired skin integrity will decrease Outcome: Progressing   Problem: Tissue Perfusion: Goal: Adequacy of tissue perfusion will improve Outcome: Progressing

## 2024-03-02 NOTE — Progress Notes (Signed)
 Speech Language Pathology Treatment: Dysphagia  Patient Details Name: [name] [name] MRN: [id number] DOB: 1941/04/01 Today's Date: 03/02/2024 Time: 4098-1191 SLP Time Calculation (min) (ACUTE ONLY): 19 min  Assessment / Plan / Recommendation Clinical Impression  Pt observed with nectar thick liquids, graham cracker and needed moderate reminders to perform left head turn and chin tuck with liquids stating " I forget sometimes." Mastication of solid was functional, timely and no oral residue. There were several delayed throat clears during session unable to determine airway protection with observation. Pt has been on nectar thick since 2023. Discussed repeating MBS with pt to determine if he could upgrade to thin and pt was agreeable. MBS to be completed tomorrow.    HPI HPI: 83 yo male adm to [facility] with AMS. 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  Swallow eval ordered.   Most recent swallow evaluation in [facility] was 2023 with recommendatino for chin tuck and head turn left with single sips.  Wife reported at that time that the pt was not able to follow those directions.  MRI showed many remote lacunar bilteral basal ganglia, thalami, pons CVAs however no acute abnormality. CXR negative for acute finding.  Pt reports he continues to use thickener at home - and it sounds as if it's nectar thick.  He is on precautions for potential Cdif.      SLP Plan  MBS (tomorrow)      Recommendations for follow up therapy are one component of a multi-disciplinary discharge planning process, led by the attending physician.  Recommendations may be updated based on patient status, additional functional criteria and insurance authorization.    Recommendations  Diet recommendations: Dysphagia 3 (mechanical soft);Nectar-thick liquid Liquids provided via: Straw;Cup Medication Administration: Whole meds with puree Supervision: Patient able to self feed Compensations: Slow rate;Small  sips/bites;Chin tuck;Other (Comment) (left head turn) Postural Changes and/or Swallow Maneuvers: Seated upright 90 degrees                  Oral care BID   Frequent or constant Supervision/Assistance Dysphagia, unspecified (R13.10)     MBS (tomorrow)     Royce Macadamia  03/02/2024, 12:08 PM

## 2024-03-02 NOTE — TOC Initial Note (Signed)
 Transition of Care [facility]) - Initial/Assessment Note    Patient Details  Name: [name] [name] MRN: [id number] Date of Birth: 01/06/1941  Transition of Care [facility]) CM/SW Contact:    Mearl Latin, LCSW Phone Number: 03/02/2024, 4:38 PM  Clinical Narrative:                 CSW received consult for possible SNF placement at time of discharge. CSW spoke with patient's daughter due to patient's confusion. She expressed understanding of PT recommendation and is agreeable to SNF placement at time of discharge as she wants to give the patient the chance to regain function prior to returning home. She is aware that hospice will need to be revoked and CSW confirmed [facility] has sent her the forms to do so. CSW discussed insurance authorization process and provided Medicare SNF ratings list with current bed offers. Daughter requests to hear back from [facility] or [facility]. Both are reviewing.    Skilled Nursing Rehab Facilities-   [email or web address]   Ratings out of 5 stars (5 the highest)   Name Address  Phone # Quality Care Staffing Health Inspection Overall  [facility] [address] [phone] 3 3 4 4   [facility] [address], [place] [phone] 5 1 4 4   [facility] [address], [facility] [place] [phone] 2 2 2 2   [facility] [address], [place] [phone] 5 2 4 5   [facility]  [address], [place] [phone] 4 3 5 5   [facility] [address] [facility] [phone] 4 2 2 2   [facility] [address] [place] [phone] 5 1 2 2   [facility] [address] [place] [phone] 2 1 3 2   [address] ([facility]) [address], [place] [phone] 2 3 3 3   [facility] [address] [place], [place] [phone] 3 3 2 2   [facility] ([place]) [address], [place] [phone] 3 1 1 1   [facility] [address] [place] [phone] 2 3 4 4   [facility] [address], [place] [phone] 4 4 3 3   [address] ([facility]) [address], [place] [phone] 1 2 4 3           [facility] [address], [place] [phone] 2 1 4 3   [facility] [address], [place] [phone] 4 2 1 1   [facility]  [address], [place], [place] [address] [phone] 2 2 2 2   [facility] [address] [phone] 3 2 4 4   [facility] [address], [place] [phone] 2 1 2 1   [facility] (No [facility]) [address], [place] [phone] 5 4 5 5   [facility] [address] [facility] [phone] 3 4 2 2   [facility] [address], [place] [phone] 2 1 1 1   [facility] [address] [place] [phone] 4 2 5 5   [facility] [address], [place] [phone] 4 1 1 1   [facility] [address] [place], [place] [phone] 2 2 3 3           [facility]  [phone] 3 1 1 1   [facility] [address], [place]lyn Clines  [phone] 3 3 3 3   [facility] (No Humana) [address], [place] [phone] 2 2 4 4   [facility]) [address], [place]alita Levan [phone] 2 1 1 1   [facility] [address], [place] [phone] 4 3 5 5   [facility] [address], [place] [phone] 1 1 1 1           [facility] [address] [place], [place] [phone] 5 4 5 5   [facility])  [address] [place] [phone] 1 1 2 1   [facility] [address] [place] [phone]  2 4 4   [facility] [address], [place] [phone] 3 5 5 5   [address] [place], [place] [phone] 4 2 2 2   [facility]) [address] [place] [phone] 2 1 3 2      Expected Discharge Plan: Skilled Nursing Facility Barriers to Discharge: Continued  Medical Work up, Insurance Authorization   Patient Goals and CMS Choice Patient states their goals for this hospitalization and ongoing recovery are:: Rehab CMS Medicare.gov Compare Post Acute Care list provided to:: Patient Represenative (must comment) Choice offered to / list presented to : Adult Children  ownership interest in [facility].provided to:: Adult Children    Expected Discharge Plan and Services In-house Referral: Clinical Social Work, Hospice / Palliative Care   Post Acute Care Choice: Skilled Nursing Facility Living arrangements for the past 2 months: Single Family Home                                      Prior Living Arrangements/Services Living arrangements for the past 2 months: Single Family Home Lives with:: Spouse Patient language and need for interpreter reviewed:: Yes Do you feel safe going back to the place where you live?: Yes      Need for Family Participation in Patient Care: Yes (Comment) Care giver support system in place?: Yes (comment) Current home services: DME (shower seat/ elevated toilet/ hand rail in bathroom/ walker/ cane) Criminal Activity/Legal Involvement Pertinent to Current Situation/Hospitalization: No - Comment as needed  Activities of Daily Living      Permission Sought/Granted Permission sought to share information with : Facility Medical sales representative, Family Supports Permission granted to share information with : No  Share Information with NAME: [name]  Permission granted to share info w AGENCY: SNFs  Permission granted to share info w Relationship: Daughter  Permission granted to share info w Contact Information: [phone]  Emotional Assessment Appearance:: Appears stated age Attitude/Demeanor/Rapport: Unable to Assess Affect (typically observed): Unable to Assess Orientation: : Oriented to Self, Oriented to Place Alcohol / Substance Use: Not Applicable Psych Involvement: No  (comment)  Admission diagnosis:  Generalized weakness [R53.1] AKI (acute kidney injury) (HCC) [N17.9] Sepsis (HCC) [A41.9] Urinary tract infection without hematuria, site unspecified [N39.0] Leukocytosis, unspecified type [D72.829] Patient Active Problem List   Diagnosis Date Noted   Sepsis (HCC) 02/29/2024   Acute urinary retention 02/29/2024   AKI (acute kidney injury) (HCC) 02/29/2024   Dysarthria 02/29/2024   Chronic combined systolic and diastolic CHF, NYHA class 1 (HCC) 02/29/2024   History of CAD (coronary artery disease) 02/29/2024   Generalized weakness 02/29/2024   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   Macrocytosis 05/30/2023   Chronic diarrhea 05/29/2023   Preventative health care 07/10/2022   Depression 03/22/2022   History of TIA (transient ischemic attack) 02/28/2022   Pain due to onychomycosis of toenails of both feet 02/13/2022   Blood clotting disorder (HCC) 02/13/2022   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   TIA (  transient ischemic attack) 04/03/2021   Lower extremity edema 08/13/2019   Unsteady gait 06/09/2019   Trigger ring finger of right hand 06/02/2018   Ceruminosis, bilateral 09/16/2017   Hyperlipidemia 01/30/2016   Carotid stenosis 08/26/2015   Anxiety, 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   Carotid artery disease (HCC) 07/01/2012   Special screening for malignant neoplasm of prostate 05/08/2011   Vitamin D deficiency 11/20/2009   Transient cerebral ischemia 06/30/2009   Recurrent strokes (HCC) 06/30/2009   COLONIC POLYPS 01/10/2008   Essential hypertension 01/10/2008   Coronary atherosclerosis 01/10/2008   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 [place] [address] Phone: [phone] Fax: [phone]     Social Drivers of Health (SDOH) Social History: SDOH Screenings   Food Insecurity: Patient Unable To Answer (02/29/2024)  Housing: Patient Unable To Answer (02/29/2024)  Transportation Needs: No Transportation Needs (02/25/2024)  Utilities: Not At Risk (02/25/2024)  Alcohol Screen: Low Risk  (06/24/2023)  Depression (PHQ2-9): Medium Risk (02/28/2024)  Financial Resource Strain: Low Risk  (06/24/2023)  Physical Activity: Inactive (06/24/2023)  Social Connections: Moderately Integrated (06/24/2023)  Stress: No Stress Concern Present (06/24/2023)  Tobacco Use: Medium Risk (02/29/2024)   SDOH Interventions:     Readmission Risk Interventions     No data to display

## 2024-03-02 NOTE — Progress Notes (Signed)
 Flexiseal removed per MD order. Foley catheter to be removed at 2 am Per Dr. Reece Agar. Will let night RN aware.

## 2024-03-02 NOTE — Progress Notes (Signed)
   Palliative Medicine Inpatient Follow Up Note HPI:  83 y.o. 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   The Palliative care team is involved to support additional goals of care conversations.   Today's Discussion 03/02/2024  *Please note that this is a verbal dictation therefore any spelling or grammatical errors are due to the "Dragon Medical One" system interpretation.  Chart reviewed inclusive of vital signs, progress notes, laboratory results, and diagnostic images.   I met with Jeannett Senior at bedside this morning. He is awake and alert. He denies pain, shortness of breath, or nausea. We reviewed the plan for rehabilitation from here.   I spoke to patients daughter, [name] this morning. We reviewed that Kage's stool resulted in being positive for norovirus. I shared his ongoing mentation improvement. We again discussed the importance of completing a MOST form moving into the future. She endorses understanding.  [name] did receive my emailed copy of a "MOST" form & The "Hard Choices for [name]" booklet though she shares she has not had a chance to look over it.   Questions and concerns addressed/Palliative Support Provided.   Objective Assessment: Vital Signs Vitals:   03/02/24 0400 03/02/24 0800  BP: 139/67 (!) 154/70  Pulse: 73 71  Resp: (!) 21 (!) 25  Temp:  97.6 F (36.4 C)  SpO2: 93% 93%    Intake/Output Summary (Last 24 hours) at 03/02/2024 1028 Last data filed at 03/02/2024 0500 Gross per 24 hour  Intake 557.97 ml  Output 300 ml  Net 257.97 ml   Last Weight  Most recent update: 02/28/2024 11:56 PM    Weight  94.8 kg  (208 lb 15.9 oz)            Gen:  Frail elderly Caucasian M in NAD HEENT: moist mucous membranes CV: Regular rate and rhythm  PULM:  On RA, breathing is even and nonlabored ABD: soft/nontender  EXT: No edema  Neuro: Alert and oriented to person and place  SUMMARY OF RECOMMENDATIONS   Full code at this time - Patients daughter would like for Jerell to make this determination for himself. She feels that once he leaves the hospital a DNAR is what she would like for him to fill out. I provided a MOST form via email for [name] to review additionally.   I have informed [name] of the above  Plan to continue to allow time for outcomes  Ongoing PMT support  MDM: Moderate ______________________________________________________________________________________ [name] [place] Palliative Medicine Team Team Cell Phone: [phone] Please utilize secure chat with additional questions, if there is no response within 30 minutes please call the above phone number  Palliative Medicine Team providers are available by phone from 7am to 7pm daily and can be reached through the team cell phone.  Should this patient require assistance outside of these hours, please call the patient's attending physician.

## 2024-03-03 ENCOUNTER — Inpatient Hospital Stay (HOSPITAL_COMMUNITY)

## 2024-03-03 ENCOUNTER — Encounter: Payer: Self-pay | Admitting: Hematology and Oncology

## 2024-03-03 DIAGNOSIS — N179 Acute kidney failure, unspecified: Secondary | ICD-10-CM | POA: Diagnosis not present

## 2024-03-03 DIAGNOSIS — A0811 Acute gastroenteropathy due to Norwalk agent: Secondary | ICD-10-CM

## 2024-03-03 DIAGNOSIS — R531 Weakness: Secondary | ICD-10-CM | POA: Diagnosis not present

## 2024-03-03 DIAGNOSIS — R338 Other retention of urine: Secondary | ICD-10-CM | POA: Diagnosis not present

## 2024-03-03 DIAGNOSIS — E119 Type 2 diabetes mellitus without complications: Secondary | ICD-10-CM

## 2024-03-03 LAB — PHOSPHORUS: Phosphorus: 2.8 mg/dL (ref 2.5–4.6)

## 2024-03-03 LAB — BASIC METABOLIC PANEL
Anion gap: 7 (ref 5–15)
BUN: 16 mg/dL (ref 8–23)
CO2: 24 mmol/L (ref 22–32)
Calcium: 7.6 mg/dL — ABNORMAL LOW (ref 8.9–10.3)
Chloride: 106 mmol/L (ref 98–111)
Creatinine, Ser: 0.78 mg/dL (ref 0.61–1.24)
GFR, Estimated: >60 mL/min (ref >60)
Glucose, Bld: 116 mg/dL — ABNORMAL HIGH (ref 70–99)
Potassium: 3.4 mmol/L — ABNORMAL LOW (ref 3.5–5.1)
Sodium: 137 mmol/L (ref 135–145)

## 2024-03-03 LAB — GLUCOSE, CAPILLARY
Glucose-Capillary: 111 mg/dL — ABNORMAL HIGH (ref 70–99)
Glucose-Capillary: 139 mg/dL — ABNORMAL HIGH (ref 70–99)
Glucose-Capillary: 112 mg/dL — ABNORMAL HIGH (ref 70–99)
Glucose-Capillary: 116 mg/dL — ABNORMAL HIGH (ref 70–99)

## 2024-03-03 LAB — MAGNESIUM: Magnesium: 2 mg/dL (ref 1.7–2.4)

## 2024-03-03 MED ORDER — POTASSIUM CHLORIDE CRYS ER 20 MEQ PO TBCR
40.0000 meq | EXTENDED_RELEASE_TABLET | ORAL | Status: AC
  Administered 2024-03-03 (×2): 40 meq via ORAL
  Filled 2024-03-03 (×2): qty 2

## 2024-03-03 MED ORDER — TAMSULOSIN HCL 0.4 MG PO CAPS: 0.4000 mg | ORAL_CAPSULE | Freq: Every day | ORAL | Status: AC

## 2024-03-03 NOTE — Progress Notes (Signed)
 Physical Therapy Treatment Patient Details Name: [name] [name] MRN: [id number] DOB: 1941/07/11 Today's Date: 03/03/2024   History of Present Illness Pt is 83 year old [facility]  [facility]24 for weakness/lethargy and diarrhea. Pt with sepsis of unclear source and acue metabolic encephalopathy. 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    PT Comments  Patient alert and eager to work with PT on mobility and getting stronger. Attempted sit to stand with RW and pt barely able to clear hips off the bed. Utilized stedy lift with pt standing from elevated surface with +2 min assist. Repeated multiple sit to stands from seat of stedy with pt standing and performing pre-gait tasks for up to 1 minutes at a time. Patient will benefit from continued inpatient follow up therapy, <3 hours/day     If plan is discharge home, recommend the following: Two people to help with walking and/or transfers;A lot of help with bathing/dressing/bathroom;Assistance with cooking/housework;Assist for transportation;Help with stairs or ramp for entrance   Can travel by private vehicle     No  Equipment Recommendations  Other (comment) (to be determined)    Recommendations for Other Services       Precautions / Restrictions Precautions Precautions: Fall;Other (comment) Recall of Precautions/Restrictions: Impaired Precaution/Restrictions Comments: enteric Restrictions Weight Bearing Restrictions Per Provider Order: No     Mobility  Bed Mobility Overal bed mobility: Needs Assistance Bed Mobility: Supine to Sit     Supine to sit: HOB elevated, Used rails, Min assist     General bed mobility comments: assist to scoot out to EOB once seated    Transfers Overall transfer level: Needs assistance   Transfers: Sit to/from Stand, Bed to chair/wheelchair/BSC Sit to Stand: From elevated surface, Via lift equipment, Min assist, +2 physical assistance            General transfer comment: able to barely clear buttocks off bed with attempt to stand with RW; able to stand with stedy +2 min assist and from stedy seat with CGA; stood for 1 min x 2 reps; wt-shifting and lifting each foot in stedy Transfer via Lift Equipment: Stedy  Ambulation/Gait             Pre-gait activities: see above     Optometrist     Tilt Bed    Modified Rankin (Stroke Patients Only)       Balance Overall balance assessment: Needs assistance Sitting-balance support: Bilateral upper extremity supported, Feet supported Sitting balance-Leahy Scale: Fair     Standing balance support: Bilateral upper extremity supported, During functional activity, Reliant on assistive device for balance Standing balance-Leahy Scale: Poor                              Communication Communication Communication: Impaired Factors Affecting Communication: Hearing impaired  Cognition Arousal: Alert Behavior During Therapy: WFL for tasks assessed/performed   PT - Cognitive impairments: No family/caregiver present to determine baseline, Memory, Problem solving, Safety/Judgement, Attention, Initiation, Orientation, Awareness   Orientation impairments: Time                   PT - Cognition Comments: Slow to process. Poor awareness of deficits. Unable to provide prior level of function Following commands: Impaired Following commands impaired: Follows one step commands with increased time    Cueing  Cueing Techniques: Verbal cues, Tactile cues  Exercises      General Comments        Pertinent Vitals/Pain Pain Assessment Pain Assessment: Faces Faces Pain Scale: No hurt    Home Living                          Prior Function            PT Goals (current goals can now be found in the care plan section) Acute Rehab PT Goals Patient Stated Goal: Pt unable to state Time For Goal Achievement:  03/15/24 Potential to Achieve Goals: Fair Progress towards PT goals: Progressing toward goals    Frequency    Min 1X/week      PT Plan      Co-evaluation              AM-PAC PT "6 Clicks" Mobility   Outcome Measure  Help needed turning from your back to your side while in a flat bed without using bedrails?: A Lot Help needed moving from lying on your back to sitting on the side of a flat bed without using bedrails?: A Lot Help needed moving to and from a bed to a chair (including a wheelchair)?: Total Help needed standing up from a chair using your arms (e.g., wheelchair or bedside chair)?: Total Help needed to walk in hospital room?: Total Help needed climbing 3-5 steps with a railing? : Total 6 Click Score: 8    End of Session Equipment Utilized During Treatment: Gait belt Activity Tolerance: Patient limited by fatigue Patient left: with call bell/phone within reach;in chair;with chair alarm set Nurse Communication: Mobility status;Need for lift equipment (stedy) PT Visit Diagnosis: Unsteadiness on feet (R26.81);Other abnormalities of gait and mobility (R26.89);Muscle weakness (generalized) (M62.81)     Time: 1610-9604 PT Time Calculation (min) (ACUTE ONLY): 23 min  Charges:    $Therapeutic Activity: 23-37 mins PT General Charges $$ ACUTE PT VISIT: 1 Visit                      Jerolyn Center, PT Acute Rehabilitation Services  Office [phone]    Zena Amos 03/03/2024, 3:18 PM

## 2024-03-03 NOTE — Discharge Summary (Signed)
 PATIENT DETAILS Name: [name] [name] Age: 83 y.o. Sex: male Date of Birth: 21-Mar-1941 MRN: [id number]. Admitting Physician: Tereasa Coop, MD [id number], Keane Scrape, NP  Admit Date: 02/28/2024 Discharge date: 03/04/2024  Recommendations for Outpatient Follow-up:  Follow up with PCP in 1-2 weeks Please obtain CMP/CBC in one week  Admitted From:  Home  Disposition: Skilled nursing facility   Discharge Condition: good  CODE STATUS:   Code Status: Full Code   Diet recommendation:  Diet Order             Diet general           Diet - low sodium heart healthy           DIET DYS 3 Room service appropriate? Yes; Fluid consistency: Nectar Thick  Diet effective now                    Brief Summary: Patient is a 83 y.o.  male with history of hereditary hemochromatosis, DM-2, HTN, HLD, CAD, chronic combined HFpEF/HFrEF, TIA-followed at home by hospice-presented with generalized weakness/lethargy.  Patient was found to have sepsis secondary to norovirus gastroenteritis and acute metabolic encephalopathy.  See below for further details.   Significant events: 2/28>> admit to [facility]   Significant studies: 2/28>> MRI brain: No acute intracranial abnormality. 2/28>> CXR: No PNA.   Significant microbiology data: 3/01>> COVID/influenza/RSV PCR: Negative 3/01>> stool C. difficile studies: Negative 3/01>> GI pathogen panel: Norovirus 3/01>> blood culture: No growth   Procedures: None   Consults: Palliative care   Brief Hospital Course: Sepsis Likely due to norovirus related gastroenteritis antibiotics have been discontinued Diarrhea has essentially resolved with as needed Imodium and other supportive care.   Acute metabolic encephalopathy Likely secondary to sepsis/dehydration from diarrhea Although improved-continues to have lingering hospital delirium Neuroimaging negative Delirium precautions   AKI Likely due to urinary retention Mild Has resolved     Acute urinary retention Continue Flomax Foley removed 3/4-now voiding spontaneously.   Hypokalemia Secondary to diarrhea Adequate.   Hypomagnesemia Repleted.  Adequate.   Hypophosphatemia Repleted.  Adequate.   CAD No anginal symptoms Plavix/statin   Chronic HFpEF Euvolemic on exam-some chronic leg edema Lasix will resume on discharge.   HLD Statin   DM-2 (A1c 7.2 on 05/29/2023) SSI  Hereditary hemochromatosis Reviewed last oncology/hematology note-no further therapeutic phlebotomy planned Has been established with home hospice but currently discharged to skilled nursing facility.   Debility/deconditioning Per daughter-significant decline in function over the past 4 weeks-before this he was able to ambulate with the help of walker PT/OT eval SNF on discharge.   Goals of care Full code Follows with home hospice in the outpatient setting Frail/debilitated-do not think he is a good candidate for aggressive care if he were to deteriorate/decompensate Palliative care following.  May benefit with ongoing palliative and hospice follow-up at a SNF.   Pressure Ulcer:  Pressure Injury 02/29/24 Buttocks Right;Left;Mid Stage 1 -  Intact skin with non-blanchable redness of a localized area usually over a bony prominence. (Active)  02/29/24 1500  Location: Buttocks  Location Orientation: Right;Left;Mid  Staging: Stage 1 -  Intact skin with non-blanchable redness of a localized area usually over a bony prominence.  Wound Description (Comments):   Present on Admission: Yes  Dressing Type Foam - Lift dressing to assess site every shift 03/03/24 2030    Discharge Diagnoses:  Principal Problem:   Sepsis (HCC) Active Problems:   Acute urinary retention   AKI (acute kidney injury) (  HCC)   Non-insulin dependent type 2 diabetes mellitus (HCC)   Hyperlipidemia   History of TIA (transient ischemic attack)   Hereditary hemochromatosis (HCC)   Dysarthria   Chronic combined  systolic and diastolic CHF, NYHA class 1 (HCC)   History of CAD (coronary artery disease)   Generalized weakness   Discharge Instructions:  Activity:  As tolerated with Full fall precautions use walker/cane & assistance as needed  Discharge Instructions     Call MD for:  extreme fatigue   Complete by: As directed    Call MD for:  persistant dizziness or light-headedness   Complete by: As directed    Diet - low sodium heart healthy   Complete by: As directed    Diet general   Complete by: As directed    Discharge instructions   Complete by: As directed    Follow with Primary MD  Doreene Nest, NP in 1-2 weeks  Please get a complete blood count and chemistry panel checked by your Primary MD at your next visit, and again as instructed by your Primary MD.  Get Medicines reviewed and adjusted: Please take all your medications with you for your next visit with your Primary MD  Laboratory/radiological data: Please request your Primary MD to go over all hospital tests and procedure/radiological results at the follow up, please ask your Primary MD to get all Hospital records sent to his/her office.  In some cases, they will be blood work, cultures and biopsy results pending at the time of your discharge. Please request that your primary care M.D. follows up on these results.  Also Note the following: If you experience worsening of your admission symptoms, develop shortness of breath, life threatening emergency, suicidal or homicidal thoughts you must seek medical attention immediately by calling 911 or calling your MD immediately  if symptoms less severe.  You must read complete instructions/literature along with all the possible adverse reactions/side effects for all the Medicines you take and that have been prescribed to you. Take any new Medicines after you have completely understood and accpet all the possible adverse reactions/side effects.   Do not drive when taking Pain  medications or sleeping medications (Benzodaizepines)  Do not take more than prescribed Pain, Sleep and Anxiety Medications. It is not advisable to combine anxiety,sleep and pain medications without talking with your primary care practitioner  Special Instructions: If you have smoked or chewed Tobacco  in the last 2 yrs please stop smoking, stop any regular Alcohol  and or any Recreational drug use.  Wear Seat belts while driving.  Please note: You were cared for by a hospitalist during your hospital stay. Once you are discharged, your primary care physician will handle any further medical issues. Please note that NO REFILLS for any discharge medications will be authorized once you are discharged, as it is imperative that you return to your primary care physician (or establish a relationship with a primary care physician if you do not have one) for your post hospital discharge needs so that they can reassess your need for medications and monitor your lab values.   Discharge wound care:   Complete by: As directed    Protect pressure points   Increase activity slowly   Complete by: As directed    Increase activity slowly   Complete by: As directed    No wound care   Complete by: As directed       Allergies as of 03/04/2024  Reactions   Niacin Other (See Comments)   intol to NIACIN w/ headaches   Ativan [lorazepam] Other (See Comments)   agitation        Medication List     TAKE these medications    Accu-Chek Guide test strip Generic drug: glucose blood Use as instructed   atorvastatin 10 MG tablet Commonly known as: LIPITOR TAKE 1 TABLET BY MOUTH EVERY DAY FOR CHOLESTEROL   cholecalciferol 25 MCG (1000 UNIT) tablet Commonly known as: VITAMIN D3 Take 1,000 Units by mouth daily.   clopidogrel 75 MG tablet Commonly known as: PLAVIX TAKE 1 TABLET BY MOUTH EVERY DAY   cyanocobalamin 500 MCG tablet Commonly known as: VITAMIN B12 Take 500 mcg by mouth daily.    furosemide 20 MG tablet Commonly known as: LASIX Take 20 mg by mouth daily as needed for edema.   tamsulosin 0.4 MG Caps capsule Commonly known as: FLOMAX Take 1 capsule (0.4 mg total) by mouth daily.               Discharge Care Instructions  (From admission, onward)           Start     Ordered   03/04/24 0000  Discharge wound care:       Comments: Protect pressure points   03/04/24 1313            Follow-up Information     Doreene Nest, NP. Schedule an appointment as soon as possible for a visit in 1 week(s).   Specialty: Internal Medicine Contact information: [address] [place] [address] [phone]                Allergies  Allergen Reactions   Niacin Other (See Comments)    intol to NIACIN w/ headaches    Ativan [Lorazepam] Other (See Comments)    agitation     Other Procedures/Studies: DG Swallowing Func-Speech Pathology Result Date: 03/03/2024 Table formatting from the original result was not included. Modified Barium Swallow Study Patient Details Name: [name] [name] MRN: [id number] Date of Birth: 02-Jan-1941 Today's Date: 03/03/2024 HPI/PMH: HPI: 83 yo male adm to [facility] with AMS. 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  Swallow eval ordered.   Most recent swallow evaluation in EPIC was 2023 with recommendatino for chin tuck and head turn left with single sips.  Wife reported at that time that the pt was not able to follow those directions.  MRI showed many remote lacunar bilteral basal ganglia, thalami, pons CVAs.  CXR negative for acute finding.  Pt reports he continues to use thickener at home - and it sounds as if it's nectar thick.  He is on precautions for potential Cdif. MBS today for potential upgrade in liquid consistency. Clinical Impression: Clinical Impression: MBS performed to determine possible liquid upgrade. Pt demonstrated pharyngeal dysphagia with a DIGEST score of 2 (moderate  impairment). His pharyngeal strength and range of motion of larynx, hyoid and glottal closure were adequate. The timing of his swallow was delayed with barium reaching his valleculae with thicker consistencies and pyriform sinuses with thin. There were two  instances, one with thin and one with larger sip of nectar where barium spilled to the vocal cords prior to closure of larynx and was aspirated during the swallow (immediate cough with thin and delayed with nectar) that did not clear aspirates. Verbal cue for smaller sip of nectar was effective. There was trace-mild vallecular residue with thin and  nectar consistencies. A left head turn and chin tuck was performed multiple times and was effective in preventing penetration/aspiration in all but one instance with penetration using straw (PAS 3). This cleared when cued to produce cough. Pt's wife was called, with pt's permission and although left head turn and chin tuck was effective, his wife stated that he has been unable to remember to do the strategy in the past when recommended and she is unable to provide 100% supervision. Wife prefers pt remain on nectar thick. His mastication was functional with solid however suspect he may be impulsive and with cognitive impairments recommend he continue with Dys 3 texture and will continue nectar thick, meds whole in puree and remain upright after meals. Esophageal scan revealed what appeared to be mild stasis mid esophagus. ST will continue while on acute. Pt has had outpatient ST in the past and dysphagia appears chronic since at least 2023 and not certain outpatient ST would make a significant difference. DIGEST Swallow Severity Rating*  Safety:  Efficiency:  Overall Pharyngeal Swallow Severity: 1: mild; 2: moderate; 3: severe; 4: profound *The Dynamic Imaging Grade of Swallowing Toxicity is standardized for the head and neck cancer population, however, demonstrates promising clinical applications across populations to  standardize the clinical rating of pharyngeal swallow safety and severity. Factors that may increase risk of adverse event in presence of aspiration Rubye Oaks & Clearance Coots 2021): Factors that may increase risk of adverse event in presence of aspiration Rubye Oaks & Clearance Coots 2021): Reduced cognitive function Recommendations/Plan: Swallowing Evaluation Recommendations Swallowing Evaluation Recommendations Recommendations: PO diet PO Diet Recommendation: Dysphagia 3 (Mechanical soft); Mildly thick liquids (Level 2, nectar thick) (wife does not want pt to return to thin liquids due to decreased memory to perform strategies- see impression statement) Liquid Administration via: Cup; Straw Medication Administration: Whole meds with puree Supervision: Patient able to self-feed; Full supervision/cueing for swallowing strategies Swallowing strategies  : Slow rate; Small bites/sips Postural changes: Stay upright 30-60 min after meals; Position pt fully upright for meals Oral care recommendations: Oral care BID (2x/day) Treatment Plan Treatment Plan Treatment recommendations: Therapy as outlined in treatment plan below Follow-up recommendations: Follow physicians's recommendations for discharge plan and follow up therapies (pt has had outpatient therapy in the past) Functional status assessment: Patient has had a recent decline in their functional status and demonstrates the ability to make significant improvements in function in a reasonable and predictable amount of time. Treatment frequency: Min 2x/week Treatment duration: 2 weeks Interventions: Diet toleration management by SLP; Compensatory techniques; Patient/family education Recommendations Recommendations for follow up therapy are one component of a multi-disciplinary discharge planning process, led by the attending physician.  Recommendations may be updated based on patient status, additional functional criteria and insurance authorization. Assessment: Orofacial Exam: Orofacial  Exam Oral Cavity: Oral Hygiene: WFL Oral Cavity - Dentition: Adequate natural dentition Orofacial Anatomy: WFL Oral Motor/Sensory Function: WFL Anatomy: Anatomy: WFL Boluses Administered: Boluses Administered Boluses Administered: Thin liquids (Level 0); Mildly thick liquids (Level 2, nectar thick); Moderately thick liquids (Level 3, honey thick); Puree; Solid  Oral Impairment Domain: Oral Impairment Domain Lip Closure: No labial escape Tongue control during bolus hold: Posterior escape of greater than half of bolus Bolus preparation/mastication: Timely and efficient chewing and mashing Bolus transport/lingual motion: Brisk tongue motion Oral residue: Complete oral clearance Location of oral residue : N/A Initiation of pharyngeal swallow : Pyriform sinuses  Pharyngeal Impairment Domain: Pharyngeal Impairment Domain Soft palate elevation: No bolus between soft palate (SP)/pharyngeal wall (PW) Laryngeal  elevation: Complete superior movement of thyroid cartilage with complete approximation of arytenoids to epiglottic petiole Anterior hyoid excursion: Complete anterior movement Epiglottic movement: Complete inversion Laryngeal vestibule closure: Complete, no air/contrast in laryngeal vestibule Pharyngeal stripping wave : Present - complete Pharyngeal contraction (A/P view only): N/A Pharyngoesophageal segment opening: Complete distension and complete duration, no obstruction of flow Tongue base retraction: No contrast between tongue base and posterior pharyngeal wall (PPW) Pharyngeal residue: Collection of residue within or on pharyngeal structures Location of pharyngeal residue: Valleculae  Esophageal Impairment Domain: Esophageal Impairment Domain Esophageal clearance upright position: Esophageal retention (mild) Pill: No data recorded Penetration/Aspiration Scale Score: Penetration/Aspiration Scale Score 1.  Material does not enter airway: Solid; Puree; Moderately thick liquids (Level 3, honey thick) 7.  Material  enters airway, passes BELOW cords and not ejected out despite cough attempt by patient: Thin liquids (Level 0); Mildly thick liquids (Level 2, nectar thick) Compensatory Strategies: Compensatory Strategies Compensatory strategies: Yes Chin tuck: Effective Effective Chin Tuck: Thin liquid (Level 0) (chin tuck in combination with left head turn) Left head turn: Effective Effective Left Head Turn: Thin liquid (Level 0)   General Information: Caregiver present: No  Diet Prior to this Study: Dysphagia 3 (mechanical soft); Mildly thick liquids (Level 2, nectar thick)   Temperature : Normal   Respiratory Status: WFL   Supplemental O2: None (Room air)   History of Recent Intubation: No  Behavior/Cognition: Alert; Cooperative; Pleasant mood; Other (Comment) (has memory deficits) Self-Feeding Abilities: Able to self-feed Baseline vocal quality/speech: Normal Volitional Cough: Able to elicit Volitional Swallow: Able to elicit Exam Limitations: No limitations Goal Planning: Prognosis for improved oropharyngeal function: -- (fair-good) Barriers to Reach Goals: Cognitive deficits No data recorded No data recorded Consulted and agree with results and recommendations: Patient; Family member/caregiver; Physician; Nurse Pain: Pain Assessment Pain Assessment: Faces Faces Pain Scale: 0 End of Session: Start Time:SLP Start Time (ACUTE ONLY): 1230 Stop Time: SLP Stop Time (ACUTE ONLY): 1247 Time Calculation:SLP Time Calculation (min) (ACUTE ONLY): 17 min Charges: SLP Evaluations $ SLP Speech Visit: 1 Visit SLP Evaluations $MBS Swallow: 1 Procedure $Swallowing Treatment: 1 Procedure SLP visit diagnosis: SLP Visit Diagnosis: Dysphagia, pharyngeal phase (R13.13) Past Medical History: Past Medical History: Diagnosis Date  Aspiration pneumonia vs. CAP 02/28/2022  Basal cell carcinoma 11/09/2019  nod & infil-behind right ear-cx3 &exc  Basal cell carcinoma 03/21/2020  Residual BCC with peripheral margin involved - ST recommends MOHs  Carotid  artery occlusion   Coronary artery disease   s/p CABG 2005; sees Dr Eden Emms yearly  Diabetes mellitus without complication (HCC)   Gynecomastia   History of COVID-19 01/08/2022  Hypercholesterolemia   Hypertension   Neurodermatitis   Overweight(278.02)   Personal history of colonic polyps 10/08/2006  tubular adenomas  Renal insufficiency   Stroke [facility])   TIA  April 2022  Transient ischemic attack 2010  "lasted ~ 5 seconds"  Vitamin D deficiency  Past Surgical History: Past Surgical History: Procedure Laterality Date  CARDIAC CATHETERIZATION  02/2004  "tried to stent; couldn't"  CAROTID ENDARTERECTOMY Right 08/26/2015  COLONOSCOPY    CORONARY ANGIOPLASTY    CORONARY ARTERY BYPASS GRAFT  Feb. 2005  4 vessel  ENDARTERECTOMY Right 08/26/2015  Procedure: Right Carotid ENDARTERECTOMY with Patch Angioplasty ;  Surgeon: Larina Earthly, MD;  Location: [facility] OR;  Service: Vascular;  Laterality: Right;  ENDARTERECTOMY Left 05/18/2021  Procedure: LEFT CAROTID ARTERY ENDARTERECTOMY  with patch angioplasty;  Surgeon: Larina Earthly, MD;  Location: [facility] OR;  Service: Vascular;  Laterality: Left;  EYE SURGERY    bilateral cataract  KELOID EXCISION  04/2008  on chest scar; Dr. Stephens November  KELOID EXCISION    PILONIDAL CYST EXCISION  1989 Royce Macadamia 03/03/2024, 2:44 PM  MR BRAIN WO CONTRAST Result Date: 02/29/2024 CLINICAL DATA:  Initial evaluation for acute TIA. EXAM: MRI HEAD WITHOUT CONTRAST TECHNIQUE: Multiplanar, multiecho pulse sequences of the brain and surrounding structures were obtained without intravenous contrast. COMPARISON:  MRI from 08/21/2022 FINDINGS: Brain: Examination moderately degraded by motion artifact. Generalized age-related cerebral atrophy. Patchy and confluent T2/FLAIR hyperintensity involving the periventricular deep white matter both cerebral hemispheres as well as the pons, consistent with chronic small vessel ischemic disease, moderate to advanced in nature. Multiple remote lacunar infarcts present about  the bilateral basal ganglia, thalami, and pons. No abnormal foci of restricted diffusion to suggest acute or subacute ischemia. Gray-white matter differentiation maintained. No areas of chronic cortical infarction. No acute intracranial hemorrhage. 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 Few small chronic micro hemorrhages noted within the right cerebral hemisphere. No mass lesion, midline shift or mass effect. Mild ventricular prominence related to global parenchymal volume loss without hydrocephalus. No extra-axial fluid collection. Pituitary gland within normal limits. Vascular: Major intracranial vascular flow voids are grossly maintained on this motion degraded exam. The right vertebral artery is not well seen. Skull and upper cervical spine: Craniocervical junction within normal limits. Bone marrow signal intensity grossly normal. No scalp soft tissue abnormality. Sinuses/Orbits: Prior bilateral ocular lens replacement. Scattered mucosal thickening noted about the maxillary sinuses. Paranasal sinuses are otherwise largely clear. No significant mastoid effusion. Other: None. IMPRESSION: 1. Motion degraded exam. 2. No acute intracranial abnormality. 3. Age-related cerebral atrophy with moderate to advanced chronic microvascular ischemic disease, with multiple remote lacunar infarcts about the bilateral basal ganglia, thalami, and pons. Electronically Signed   By: Rise Mu M.D.   On: 02/29/2024 03:42   DG Chest Port 1 View Result Date: 02/29/2024 CLINICAL DATA:  Weakness EXAM: PORTABLE CHEST 1 VIEW COMPARISON:  10/13/2023 FINDINGS: Post sternotomy changes. Enlarged cardiomediastinal silhouette. Elevation of the right diaphragm. No acute airspace disease, pleural effusion or pneumothorax IMPRESSION: No active disease.  Cardiomegaly Electronically Signed   By: Jasmine Pang M.D.   On: 02/29/2024 00:05     TODAY-DAY OF  DISCHARGE:  Subjective:   Patient seen and examined.  Wife was on the FaceTime with patient.  Patient with some tangential thoughts but able to tell me he feels fine.  Denies any nausea vomiting.  He was put on chair for 2 hours and he felt tired. Wife on the phone agreeable to rehab placement.  Objective:   Blood pressure (!) 142/66, pulse 67, temperature 98.1 F (36.7 C), temperature source Oral, resp. rate (!) 24, height 6' (1.829 m), weight 94.8 kg, SpO2 92%.  Intake/Output Summary (Last 24 hours) at 03/04/2024 1314 Last data filed at 03/03/2024 1743 Gross per 24 hour  Intake 240 ml  Output --  Net 240 ml   Filed Weights   02/28/24 2300  Weight: 94.8 kg    Exam: Awake Alert, mostly oriented.  Flat affect. No new F.N deficits,  Frail and debilitated. Supple Neck,No JVD, No cervical lymphadenopathy appriciated.  Symmetrical Chest wall movement, Good air movement bilaterally, CTAB RRR,No Gallops,Rubs or new Murmurs, No Parasternal Heave +ve B.Sounds, Abd Soft, Non tender, No organomegaly appriciated, No rebound -guarding or rigidity. No Cyanosis, Clubbing or edema, No new Rash or bruise  PERTINENT RADIOLOGIC STUDIES: DG Swallowing Func-Speech Pathology Result Date: 03/03/2024 Table formatting from the original result was not included. Modified Barium Swallow Study Patient Details Name: [name] [name] MRN: [id number] Date of Birth: 05-24-1941 Today's Date: 03/03/2024 HPI/PMH: HPI: 83 yo male adm to [facility] with AMS. 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  Swallow eval ordered.   Most recent swallow evaluation in EPIC was 2023 with recommendatino for chin tuck and head turn left with single sips.  Wife reported at that time that the pt was not able to follow those directions.  MRI showed many remote lacunar bilteral basal ganglia, thalami, pons CVAs.  CXR negative for acute finding.  Pt reports he continues to use thickener at home - and it sounds as if  it's nectar thick.  He is on precautions for potential Cdif. MBS today for potential upgrade in liquid consistency. Clinical Impression: Clinical Impression: MBS performed to determine possible liquid upgrade. Pt demonstrated pharyngeal dysphagia with a DIGEST score of 2 (moderate impairment). His pharyngeal strength and range of motion of larynx, hyoid and glottal closure were adequate. The timing of his swallow was delayed with barium reaching his valleculae with thicker consistencies and pyriform sinuses with thin. There were two  instances, one with thin and one with larger sip of nectar where barium spilled to the vocal cords prior to closure of larynx and was aspirated during the swallow (immediate cough with thin and delayed with nectar) that did not clear aspirates. Verbal cue for smaller sip of nectar was effective. There was trace-mild vallecular residue with thin and nectar consistencies. A left head turn and chin tuck was performed multiple times and was effective in preventing penetration/aspiration in all but one instance with penetration using straw (PAS 3). This cleared when cued to produce cough. Pt's wife was called, with pt's permission and although left head turn and chin tuck was effective, his wife stated that he has been unable to remember to do the strategy in the past when recommended and she is unable to provide 100% supervision. Wife prefers pt remain on nectar thick. His mastication was functional with solid however suspect he may be impulsive and with cognitive impairments recommend he continue with Dys 3 texture and will continue nectar thick, meds whole in puree and remain upright after meals. Esophageal scan revealed what appeared to be mild stasis mid esophagus. ST will continue while on acute. Pt has had outpatient ST in the past and dysphagia appears chronic since at least 2023 and not certain outpatient ST would make a significant difference. DIGEST Swallow Severity Rating*   Safety:  Efficiency:  Overall Pharyngeal Swallow Severity: 1: mild; 2: moderate; 3: severe; 4: profound *The Dynamic Imaging Grade of Swallowing Toxicity is standardized for the head and neck cancer population, however, demonstrates promising clinical applications across populations to standardize the clinical rating of pharyngeal swallow safety and severity. Factors that may increase risk of adverse event in presence of aspiration Rubye Oaks & Clearance Coots 2021): Factors that may increase risk of adverse event in presence of aspiration Rubye Oaks & Clearance Coots 2021): Reduced cognitive function Recommendations/Plan: Swallowing Evaluation Recommendations Swallowing Evaluation Recommendations Recommendations: PO diet PO Diet Recommendation: Dysphagia 3 (Mechanical soft); Mildly thick liquids (Level 2, nectar thick) (wife does not want pt to return to thin liquids due to decreased memory to perform strategies- see impression statement) Liquid Administration via: Cup; Straw Medication Administration: Whole meds with puree Supervision: Patient able to self-feed; Full supervision/cueing for swallowing strategies  Swallowing strategies  : Slow rate; Small bites/sips Postural changes: Stay upright 30-60 min after meals; Position pt fully upright for meals Oral care recommendations: Oral care BID (2x/day) Treatment Plan Treatment Plan Treatment recommendations: Therapy as outlined in treatment plan below Follow-up recommendations: Follow physicians's recommendations for discharge plan and follow up therapies (pt has had outpatient therapy in the past) Functional status assessment: Patient has had a recent decline in their functional status and demonstrates the ability to make significant improvements in function in a reasonable and predictable amount of time. Treatment frequency: Min 2x/week Treatment duration: 2 weeks Interventions: Diet toleration management by SLP; Compensatory techniques; Patient/family education Recommendations  Recommendations for follow up therapy are one component of a multi-disciplinary discharge planning process, led by the attending physician.  Recommendations may be updated based on patient status, additional functional criteria and insurance authorization. Assessment: Orofacial Exam: Orofacial Exam Oral Cavity: Oral Hygiene: WFL Oral Cavity - Dentition: Adequate natural dentition Orofacial Anatomy: WFL Oral Motor/Sensory Function: WFL Anatomy: Anatomy: WFL Boluses Administered: Boluses Administered Boluses Administered: Thin liquids (Level 0); Mildly thick liquids (Level 2, nectar thick); Moderately thick liquids (Level 3, honey thick); Puree; Solid  Oral Impairment Domain: Oral Impairment Domain Lip Closure: No labial escape Tongue control during bolus hold: Posterior escape of greater than half of bolus Bolus preparation/mastication: Timely and efficient chewing and mashing Bolus transport/lingual motion: Brisk tongue motion Oral residue: Complete oral clearance Location of oral residue : N/A Initiation of pharyngeal swallow : Pyriform sinuses  Pharyngeal Impairment Domain: Pharyngeal Impairment Domain Soft palate elevation: No bolus between soft palate (SP)/pharyngeal wall (PW) Laryngeal elevation: Complete superior movement of thyroid cartilage with complete approximation of arytenoids to epiglottic petiole Anterior hyoid excursion: Complete anterior movement Epiglottic movement: Complete inversion Laryngeal vestibule closure: Complete, no air/contrast in laryngeal vestibule Pharyngeal stripping wave : Present - complete Pharyngeal contraction (A/P view only): N/A Pharyngoesophageal segment opening: Complete distension and complete duration, no obstruction of flow Tongue base retraction: No contrast between tongue base and posterior pharyngeal wall (PPW) Pharyngeal residue: Collection of residue within or on pharyngeal structures Location of pharyngeal residue: Valleculae  Esophageal Impairment Domain:  Esophageal Impairment Domain Esophageal clearance upright position: Esophageal retention (mild) Pill: No data recorded Penetration/Aspiration Scale Score: Penetration/Aspiration Scale Score 1.  Material does not enter airway: Solid; Puree; Moderately thick liquids (Level 3, honey thick) 7.  Material enters airway, passes BELOW cords and not ejected out despite cough attempt by patient: Thin liquids (Level 0); Mildly thick liquids (Level 2, nectar thick) Compensatory Strategies: Compensatory Strategies Compensatory strategies: Yes Chin tuck: Effective Effective Chin Tuck: Thin liquid (Level 0) (chin tuck in combination with left head turn) Left head turn: Effective Effective Left Head Turn: Thin liquid (Level 0)   General Information: Caregiver present: No  Diet Prior to this Study: Dysphagia 3 (mechanical soft); Mildly thick liquids (Level 2, nectar thick)   Temperature : Normal   Respiratory Status: WFL   Supplemental O2: None (Room air)   History of Recent Intubation: No  Behavior/Cognition: Alert; Cooperative; Pleasant mood; Other (Comment) (has memory deficits) Self-Feeding Abilities: Able to self-feed Baseline vocal quality/speech: Normal Volitional Cough: Able to elicit Volitional Swallow: Able to elicit Exam Limitations: No limitations Goal Planning: Prognosis for improved oropharyngeal function: -- (fair-good) Barriers to Reach Goals: Cognitive deficits No data recorded No data recorded Consulted and agree with results and recommendations: Patient; Family member/caregiver; Physician; Nurse Pain: Pain Assessment Pain Assessment: Faces Faces Pain Scale: 0 End of Session: Start Time:SLP Start  Time (ACUTE ONLY): 1230 Stop Time: SLP Stop Time (ACUTE ONLY): 1247 Time Calculation:SLP Time Calculation (min) (ACUTE ONLY): 17 min Charges: SLP Evaluations $ SLP Speech Visit: 1 Visit SLP Evaluations $MBS Swallow: 1 Procedure $Swallowing Treatment: 1 Procedure SLP visit diagnosis: SLP Visit Diagnosis: Dysphagia,  pharyngeal phase (R13.13) Past Medical History: Past Medical History: Diagnosis Date  Aspiration pneumonia vs. CAP 02/28/2022  Basal cell carcinoma 11/09/2019  nod & infil-behind right ear-cx3 &exc  Basal cell carcinoma 03/21/2020  Residual BCC with peripheral margin involved - ST recommends MOHs  Carotid artery occlusion   Coronary artery disease   s/p CABG 2005; sees Dr Eden Emms yearly  Diabetes mellitus without complication (HCC)   Gynecomastia   History of COVID-19 01/08/2022  Hypercholesterolemia   Hypertension   Neurodermatitis   Overweight(278.02)   Personal history of colonic polyps 10/08/2006  tubular adenomas  Renal insufficiency   Stroke [facility])   TIA  April 2022  Transient ischemic attack 2010  "lasted ~ 5 seconds"  Vitamin D deficiency  Past Surgical History: Past Surgical History: Procedure Laterality Date  CARDIAC CATHETERIZATION  02/2004  "tried to stent; couldn't"  CAROTID ENDARTERECTOMY Right 08/26/2015  COLONOSCOPY    CORONARY ANGIOPLASTY    CORONARY ARTERY BYPASS GRAFT  Feb. 2005  4 vessel  ENDARTERECTOMY Right 08/26/2015  Procedure: Right Carotid ENDARTERECTOMY with Patch Angioplasty ;  Surgeon: Larina Earthly, MD;  Location: [facility];  Service: Vascular;  Laterality: Right;  ENDARTERECTOMY Left 05/18/2021  Procedure: LEFT CAROTID ARTERY ENDARTERECTOMY  with patch angioplasty;  Surgeon: Larina Earthly, MD;  Location: [facility] OR;  Service: Vascular;  Laterality: Left;  EYE SURGERY    bilateral cataract  KELOID EXCISION  04/2008  on chest scar; Dr. Stephens November  KELOID EXCISION    PILONIDAL CYST EXCISION  1989 Royce Macadamia 03/03/2024, 2:44 PM    PERTINENT LAB RESULTS: CBC: Recent Labs    03/02/24 0551  WBC 6.5  HGB 12.8*  HCT 37.8*  PLT 219   CMET CMP     Component Value Date/Time   NA 139 03/04/2024 0517   NA 143 10/28/2023 1215   K 4.0 03/04/2024 0517   CL 107 03/04/2024 0517   CO2 25 03/04/2024 0517   GLUCOSE 118 (H) 03/04/2024 0517   BUN 11 03/04/2024 0517   BUN 11 10/28/2023 1215    CREATININE 0.77 03/04/2024 0517   CREATININE 0.99 02/07/2024 1017   CALCIUM 8.0 (L) 03/04/2024 0517   PROT 6.1 (L) 02/29/2024 0338   PROT 5.9 (L) 10/28/2023 1215   ALBUMIN 2.5 (L) 02/29/2024 0338   ALBUMIN 3.6 (L) 10/28/2023 1215   AST 47 (H) 02/29/2024 0338   AST 16 02/07/2024 1017   ALT 40 02/29/2024 0338   ALT 12 02/07/2024 1017   ALKPHOS 271 (H) 02/29/2024 0338   BILITOT 1.0 02/29/2024 0338   BILITOT 0.5 02/07/2024 1017   GFR 65.66 07/10/2022 1332   EGFR 88 10/28/2023 1215   GFRNONAA >60 03/04/2024 0517   GFRNONAA >60 02/07/2024 1017    GFR Estimated Creatinine Clearance: 88 mL/min (by C-G formula based on SCr of 0.77 mg/dL). No results for input(s): "LIPASE", "AMYLASE" in the last 72 hours. No results for input(s): "CKTOTAL", "CKMB", "CKMBINDEX", "TROPONINI" in the last 72 hours. Invalid input(s): "POCBNP" No results for input(s): "DDIMER" in the last 72 hours. Recent Labs    03/01/24 1336  HGBA1C 6.6*   No results for input(s): "CHOL", "HDL", "LDLCALC", "TRIG", "CHOLHDL", "LDLDIRECT" in the last 72 hours. No  results for input(s): "TSH", "T4TOTAL", "T3FREE", "THYROIDAB" in the last 72 hours.  Invalid input(s): "FREET3" No results for input(s): "VITAMINB12", "FOLATE", "FERRITIN", "TIBC", "IRON", "RETICCTPCT" in the last 72 hours. Coags: No results for input(s): "INR" in the last 72 hours.  Invalid input(s): "PT" Microbiology: Recent Results (from the past 240 hours)  Resp panel by RT-PCR (RSV, Flu A&B, Covid) Anterior Nasal Swab     Status: None   Collection Time: 02/29/24 12:14 AM   Specimen: Anterior Nasal Swab  Result Value Ref Range Status   SARS Coronavirus 2 by RT PCR NEGATIVE NEGATIVE Final   Influenza A by PCR NEGATIVE NEGATIVE Final   Influenza B by PCR NEGATIVE NEGATIVE Final    Comment: (NOTE) The Xpert Xpress SARS-CoV-2/FLU/RSV plus assay is intended as an aid in the diagnosis of influenza from Nasopharyngeal swab specimens and should not be used as  a sole basis for treatment. Nasal washings and aspirates are unacceptable for Xpert Xpress SARS-CoV-2/FLU/RSV testing.  Fact Sheet for Patients: BloggerCourse.com  Fact Sheet for Healthcare Providers: SeriousBroker.it  This test is not yet approved or cleared by the [place] FDA and has been authorized for detection and/or diagnosis of SARS-CoV-2 by FDA under an Emergency Use Authorization (EUA). This EUA will remain in effect (meaning this test can be used) for the duration of the COVID-19 declaration under Section 564(b)(1) of the Act, 21 U.S.C. section 360bbb-3(b)(1), unless the authorization is terminated or revoked.     Resp Syncytial Virus by PCR NEGATIVE NEGATIVE Final    Comment: (NOTE) Fact Sheet for Patients: BloggerCourse.com  Fact Sheet for Healthcare Providers: SeriousBroker.it  This test is not yet approved or cleared by the [place] FDA and has been authorized for detection and/or diagnosis of SARS-CoV-2 by FDA under an Emergency Use Authorization (EUA). This EUA will remain in effect (meaning this test can be used) for the duration of the COVID-19 declaration under Section 564(b)(1) of the Act, 21 U.S.C. section 360bbb-3(b)(1), unless the authorization is terminated or revoked.  Performed at [facility], [address] [place], [place] [address]   Blood Culture (routine x 2)     Status: None (Preliminary result)   Collection Time: 02/29/24 12:14 AM   Specimen: BLOOD RIGHT HAND  Result Value Ref Range Status   Specimen Description BLOOD RIGHT HAND  Final   Special Requests   Final    BOTTLES DRAWN AEROBIC AND ANAEROBIC Blood Culture adequate volume   Culture   Final    NO GROWTH 4 DAYS Performed at [facility], [address] [place], [place] [address]    Report Status PENDING  Incomplete  Blood Culture (routine x 2)     Status:  None (Preliminary result)   Collection Time: 02/29/24 12:14 AM   Specimen: BLOOD  Result Value Ref Range Status   Specimen Description BLOOD RIGHT ANTECUBITAL  Final   Special Requests   Final    BOTTLES DRAWN AEROBIC AND ANAEROBIC Blood Culture results may not be optimal due to an inadequate volume of blood received in culture bottles   Culture   Final    NO GROWTH 4 DAYS Performed at [facility], [address], [place], [place] [address]    Report Status PENDING  Incomplete  Urine Culture     Status: Abnormal   Collection Time: 02/29/24  8:59 AM   Specimen: Urine, Random  Result Value Ref Range Status   Specimen Description URINE, RANDOM  Final  Special Requests   Final    NONE Reflexed from [id number] Performed at [facility], [address], [place], [place] [address]    Culture MULTIPLE SPECIES PRESENT, SUGGEST RECOLLECTION (A)  Final   Report Status 03/01/2024 FINAL  Final  Gastrointestinal Panel by PCR , Stool     Status: Abnormal   Collection Time: 02/29/24  1:14 PM   Specimen: Stool  Result Value Ref Range Status   Campylobacter species NOT DETECTED NOT DETECTED Final   Plesimonas shigelloides NOT DETECTED NOT DETECTED Final   Salmonella species NOT DETECTED NOT DETECTED Final   Yersinia enterocolitica NOT DETECTED NOT DETECTED Final   Vibrio species NOT DETECTED NOT DETECTED Final   Vibrio cholerae NOT DETECTED NOT DETECTED Final   Enteroaggregative E coli (EAEC) NOT DETECTED NOT DETECTED Final   Enteropathogenic E coli (EPEC) NOT DETECTED NOT DETECTED Final   Enterotoxigenic E coli (ETEC) NOT DETECTED NOT DETECTED Final   Shiga like toxin producing E coli (STEC) NOT DETECTED NOT DETECTED Final   Shigella/Enteroinvasive E coli (EIEC) NOT DETECTED NOT DETECTED Final   Cryptosporidium NOT DETECTED NOT DETECTED Final   Cyclospora cayetanensis NOT DETECTED NOT DETECTED Final   Entamoeba histolytica NOT DETECTED NOT DETECTED Final   Giardia lamblia NOT  DETECTED NOT DETECTED Final   Adenovirus F40/41 NOT DETECTED NOT DETECTED Final   Astrovirus NOT DETECTED NOT DETECTED Final   Norovirus GI/GII DETECTED (A) NOT DETECTED Final    Comment: RESULT CALLED TO, READ BACK BY AND VERIFIED WITH: DELICIA MANUEL AT 1509 03/01/24.PMF    Rotavirus A NOT DETECTED NOT DETECTED Final   Sapovirus (I, II, IV, and V) NOT DETECTED NOT DETECTED Final    Comment: Performed at [facility], [address] [place], [place] [address]  C Difficile Quick Screen w PCR reflex     Status: None   Collection Time: 02/29/24  1:14 PM   Specimen: Stool  Result Value Ref Range Status   C Diff antigen NEGATIVE NEGATIVE Final   C Diff toxin NEGATIVE NEGATIVE Final   C Diff interpretation No C. difficile detected.  Final    Comment: Performed at [facility], [address], [place], [place] [address]    FURTHER DISCHARGE INSTRUCTIONS:  Get Medicines reviewed and adjusted: Please take all your medications with you for your next visit with your Primary MD  Laboratory/radiological data: Please request your Primary MD to go over all hospital tests and procedure/radiological results at the follow up, please ask your Primary MD to get all Hospital records sent to his/her office.  In some cases, they will be blood work, cultures and biopsy results pending at the time of your discharge. Please request that your primary care M.D. goes through all the records of your hospital data and follows up on these results.  Also Note the following: If you experience worsening of your admission symptoms, develop shortness of breath, life threatening emergency, suicidal or homicidal thoughts you must seek medical attention immediately by calling 911 or calling your MD immediately  if symptoms less severe.  You must read complete instructions/literature along with all the possible adverse reactions/side effects for all the Medicines you take and that have been prescribed to  you. Take any new Medicines after you have completely understood and accpet all the possible adverse reactions/side effects.   Do not drive when taking Pain medications or sleeping medications (Benzodaizepines)  Do not take more than prescribed Pain, Sleep and Anxiety Medications.  It is not advisable to combine anxiety,sleep and pain medications without talking with your primary care practitioner  Special Instructions: If you have smoked or chewed Tobacco  in the last 2 yrs please stop smoking, stop any regular Alcohol  and or any Recreational drug use.  Wear Seat belts while driving.  Please note: You were cared for by a hospitalist during your hospital stay. Once you are discharged, your primary care physician will handle any further medical issues. Please note that NO REFILLS for any discharge medications will be authorized once you are discharged, as it is imperative that you return to your primary care physician (or establish a relationship with a primary care physician if you do not have one) for your post hospital discharge needs so that they can reassess your need for medications and monitor your lab values.  Total Time spent coordinating discharge including counseling, education and face to face time equals greater than 30 minutes.  SignedDorcas Carrow 03/04/2024 1:14 PM

## 2024-03-03 NOTE — Progress Notes (Signed)
 Modified Barium Swallow Study  Patient Details  Name: [name] [name] MRN: [id number] Date of Birth: 01/06/1941  Today's Date: 03/03/2024  Modified Barium Swallow completed.  Full report located under Chart Review in the Imaging Section.  History of Present Illness 83 yo male adm to [facility] with AMS. 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  Swallow eval ordered.   Most recent swallow evaluation in [facility] was 2023 with recommendatino for chin tuck and head turn left with single sips.  Wife reported at that time that the pt was not able to follow those directions.  MRI showed many remote lacunar bilteral basal ganglia, thalami, pons CVAs.  CXR negative for acute finding.  Pt reports he continues to use thickener at home - and it sounds as if it's nectar thick.  He is on precautions for potential Cdif. MBS today for potential upgrade in liquid consistency.   Clinical Impression MBS performed to determine possible liquid upgrade. Pt demonstrated pharyngeal dysphagia with a DIGEST score of 2 (moderate impairment). His pharyngeal strength and range of motion of larynx, hyoid and glottal closure were adequate. The timing of his swallow was delayed with barium reaching his valleculae with thicker consistencies and pyriform sinuses with thin. There were two  instances, one with thin and one with larger sip of nectar where barium spilled to the vocal cords prior to closure of larynx and was aspirated during the swallow (immediate cough with thin and delayed with nectar) that did not clear aspirates. Verbal cue for smaller sip of nectar was effective. There was trace-mild vallecular residue with thin and nectar consistencies. A left head turn and chin tuck was performed multiple times and was effective in preventing penetration/aspiration in all but one instance with penetration using straw (PAS 3). This cleared when cued to produce cough. Pt's wife was called, with pt's permission and  although left head turn and chin tuck was effective, his wife stated that he has been unable to remember to do the strategy in the past when recommended and she is unable to provide 100% supervision. Wife prefers pt remain on nectar thick. His mastication was functional with solid however suspect he may be impulsive and with cognitive impairments recommend he continue with Dys 3 texture and will continue nectar thick, meds whole in puree and remain upright after meals. Esophageal scan revealed what appeared to be mild stasis mid esophagus. ST will continue while on acute. Pt has had outpatient ST in the past and dysphagia appears chronic since at least 2023 and not certain outpatient ST would make a significant difference. Factors that may increase risk of adverse event in presence of aspiration Rubye Oaks & Clearance Coots 2021): Reduced cognitive function  DIGEST Swallow Severity Rating*  Safety:   Efficiency:  Overall Pharyngeal Swallow Severity:  1: mild; 2: moderate; 3: severe; 4: profound  *The Dynamic Imaging Grade of Swallowing Toxicity is standardized for the head and neck cancer population, however, demonstrates promising clinical applications across populations to standardize the clinical rating of pharyngeal swallow safety and severity.   Swallow Evaluation Recommendations Recommendations: PO diet PO Diet Recommendation: Dysphagia 3 (Mechanical soft);Mildly thick liquids (Level 2, nectar thick) (wife does not want pt to return to thin liquids due to decreased memory to perform strategies- see impression statement) Liquid Administration via: Cup;Straw Medication Administration: Whole meds with puree Supervision: Patient able to self-feed;Full supervision/cueing for swallowing strategies Swallowing strategies  : Slow rate;Small bites/sips Postural changes: Stay upright 30-60 min after meals;Position  pt fully upright for meals Oral care recommendations: Oral care BID (2x/day)      Royce Macadamia 03/03/2024,2:45 PM

## 2024-03-03 NOTE — Progress Notes (Addendum)
 PROGRESS NOTE        PATIENT DETAILS Name: [name] [name] Age: 83 y.o. Sex: male Date of Birth: December 10, 1941 Admit Date: 02/28/2024 Admitting Physician Tereasa Coop, MD LKG:[id number], Keane Scrape, NP  Brief Summary: Patient is a 83 y.o.  male with history of hereditary hemochromatosis, DM-2, HTN, HLD, CAD, chronic combined HFpEF/HFrEF, TIA-followed at home by hospice-presented with generalized weakness/lethargy.  Patient was found to have sepsis secondary to norovirus gastroenteritis and acute metabolic encephalopathy.  See below for further details.  Significant events: 2/28>> admit to [facility]  Significant studies: 2/28>> MRI brain: No acute intracranial abnormality. 2/28>> CXR: No PNA.  Significant microbiology data: 3/01>> COVID/influenza/RSV PCR: Negative 3/01>> stool C. difficile studies: Negative 3/01>> GI pathogen panel: Norovirus 3/01>> blood culture: No growth  Procedures: None  Consults: Palliative care  Subjective: No major issues overnight-Foley was discontinued earlier this morning-he is voiding spontaneously.  No diarrhea.  Objective: Vitals: Blood pressure 132/67, pulse 70, temperature 98.6 F (37 C), temperature source Axillary, resp. rate 20, height 6' (1.829 m), weight 94.8 kg, SpO2 92%.   Exam: Gen Exam:Alert awake-not in any distress HEENT:atraumatic, normocephalic Chest: B/L clear to auscultation anteriorly CVS:S1S2 regular Abdomen:soft non tender, non distended Extremities:no edema Neurology: Non focal Skin: no rash  Pertinent Labs/Radiology:    Latest Ref Rng & Units 03/02/2024    5:51 AM 03/01/2024    6:02 AM 02/29/2024    3:38 AM  CBC  WBC 4.0 - 10.5 K/uL 6.5  11.1  24.5   Hemoglobin 13.0 - 17.0 g/dL 72.5  36.6  44.0   Hematocrit 39.0 - 52.0 % 37.8  40.7  44.9   Platelets 150 - 400 K/uL 219  241  351     Lab Results  Component Value Date   NA 137 03/03/2024   K 3.4 (L) 03/03/2024   CL 106 03/03/2024   CO2 24  03/03/2024     Assessment/Plan: Sepsis Likely due to norovirus related gastroenteritis On antibiotics have been discontinued Diarrhea has essentially resolved with as needed Imodium and other supportive care.  Acute metabolic encephalopathy Likely secondary to sepsis/dehydration from diarrhea Although improved-continues to have lingering hospital delirium Neuroimaging negative Delirium precautions  AKI Likely due to urinary retention Mild Has resolved with Foley catheter placement  Acute urinary retention Continue Flomax Foley removed 3/4-now voiding spontaneously.  Hypokalemia Secondary to diarrhea Continue to replete-recheck in AM.  Hypomagnesemia Repleted  Hypophosphatemia Repleted  CAD No anginal symptoms Plavix/statin  Chronic HFpEF Euvolemic on exam-some chronic leg edema Lasix on hold  HLD Statin  DM-2 (A1c 7.2 on 05/29/2023) SSI   Recent Labs    03/02/24 2005 03/02/24 2304 03/03/24 0836  GLUCAP 91 113* 111*     Hereditary hemochromatosis Reviewed last oncology/hematology note-no further therapeutic phlebotomy planned Has been established with home hospice  Debility/deconditioning Per daughter-significant decline in function over the past 4 weeks-before this he was able to ambulate with the help of walker PT/OT eval SNF on discharge.  Goals of care Full code Follows with home hospice in the outpatient setting Frail/debilitated-do not think he is a good candidate for aggressive care if he were to deteriorate/decompensate Palliative care following  Pressure Ulcer: Agree with assessment and plan as outlined below. Pressure Injury 02/29/24 Buttocks Right;Left;Mid Stage 1 -  Intact skin with non-blanchable redness of a localized area usually over a bony  prominence. (Active)  02/29/24 1500  Location: Buttocks  Location Orientation: Right;Left;Mid  Staging: Stage 1 -  Intact skin with non-blanchable redness of a localized area usually over a  bony prominence.  Wound Description (Comments):   Present on Admission: Yes  Dressing Type Foam - Lift dressing to assess site every shift 03/02/24 2000    BMI: Estimated body mass index is 28.34 kg/m as calculated from the following:   Height as of this encounter: 6' (1.829 m).   Weight as of this encounter: 94.8 kg.   Code status:   Code Status: Full Code   DVT Prophylaxis: enoxaparin (LOVENOX) injection 40 mg Start: 02/29/24 1445 SCDs Start: 02/29/24 0124 Place TED hose Start: 02/29/24 0124    Family Communication: Daugther-Michelle-[phone] updated 3/3   Disposition Plan: Status is: Inpatient Remains inpatient appropriate because: Severity of illness   Planned Discharge Destination: SNF-medically stable-awaiting insurance authorization.   Diet: Diet Order             DIET DYS 3 Room service appropriate? Yes; Fluid consistency: Nectar Thick  Diet effective now                     Antimicrobial agents: Anti-infectives (From admission, onward)    Start     Dose/Rate Route Frequency Ordered Stop   03/01/24 1100  cefTRIAXone (ROCEPHIN) 2 g in sodium chloride 0.9 % 100 mL IVPB  Status:  Discontinued        2 g 200 mL/hr over 30 Minutes Intravenous Every 24 hours 03/01/24 1011 03/02/24 1155   02/29/24 1300  vancomycin (VANCOREADY) IVPB 750 mg/150 mL  Status:  Discontinued        750 mg 150 mL/hr over 60 Minutes Intravenous Every 12 hours 02/29/24 0216 03/01/24 1007   02/29/24 1100  ceFEPIme (MAXIPIME) 2 g in sodium chloride 0.9 % 100 mL IVPB  Status:  Discontinued        2 g 200 mL/hr over 30 Minutes Intravenous Every 12 hours 02/29/24 0216 03/01/24 1010   02/29/24 0000  vancomycin (VANCOREADY) IVPB 2000 mg/400 mL        2,000 mg 200 mL/hr over 120 Minutes Intravenous  Once 02/28/24 2357 02/29/24 0242   02/28/24 2345  ceFEPIme (MAXIPIME) 2 g in sodium chloride 0.9 % 100 mL IVPB        2 g 200 mL/hr over 30 Minutes Intravenous  Once 02/28/24 2343  02/29/24 0020   02/28/24 2345  metroNIDAZOLE (FLAGYL) IVPB 500 mg        500 mg 100 mL/hr over 60 Minutes Intravenous  Once 02/28/24 2343 02/29/24 0135   02/28/24 2345  vancomycin (VANCOCIN) IVPB 1000 mg/200 mL premix  Status:  Discontinued        1,000 mg 200 mL/hr over 60 Minutes Intravenous  Once 02/28/24 2343 02/28/24 2357        MEDICATIONS: Scheduled Meds:  atorvastatin  10 mg Oral Daily   Chlorhexidine Gluconate Cloth  6 each Topical Daily   clopidogrel  75 mg Oral Daily   enoxaparin (LOVENOX) injection  40 mg Subcutaneous Q24H   insulin aspart  0-9 Units Subcutaneous TID WC   potassium chloride  40 mEq Oral Q4H   sodium chloride flush  3 mL Intravenous Q12H   tamsulosin  0.4 mg Oral Daily   Continuous Infusions:   PRN Meds:.acetaminophen **OR** acetaminophen, dextrose, food thickener, ondansetron **OR** ondansetron (ZOFRAN) IV, sodium chloride flush   I have personally reviewed following labs and  imaging studies  LABORATORY DATA: CBC: Recent Labs  Lab 02/28/24 2214 02/29/24 0338 03/01/24 0602 03/02/24 0551  WBC 26.1* 24.5* 11.1* 6.5  NEUTROABS 22.8*  --   --   --   HGB 15.0 14.8 13.5 12.8*  HCT 43.4 44.9 40.7 37.8*  MCV 101.4* 103.9* 101.8* 102.2*  PLT 370 351 241 219    Basic Metabolic Panel: Recent Labs  Lab 02/28/24 2214 02/29/24 0338 03/01/24 0602 03/02/24 0551 03/03/24 0458  NA 138 138 138 139 137  K 3.4* 3.5 2.8* 3.3* 3.4*  CL 102 103 108 107 106  CO2 22 23 21* 23 24  GLUCOSE 227* 138* 135* 112* 116*  BUN 15 16 21 20 16   CREATININE 1.38* 1.23 1.12 1.00 0.78  CALCIUM 8.1* 7.9* 7.6* 7.7* 7.6*  MG  --   --  1.7 2.1 2.0  PHOS  --   --  3.0 1.8* 2.8    GFR: Estimated Creatinine Clearance: 88 mL/min (by C-G formula based on SCr of 0.78 mg/dL).  Liver Function Tests: Recent Labs  Lab 02/29/24 0014 02/29/24 0338  AST 41 47*  ALT 35 40  ALKPHOS 233* 271*  BILITOT 0.6 1.0  PROT 5.2* 6.1*  ALBUMIN 2.0* 2.5*   No results for  input(s): "LIPASE", "AMYLASE" in the last 168 hours. No results for input(s): "AMMONIA" in the last 168 hours.  Coagulation Profile: Recent Labs  Lab 02/29/24 0014  INR 1.1    Cardiac Enzymes: No results for input(s): "CKTOTAL", "CKMB", "CKMBINDEX", "TROPONINI" in the last 168 hours.  BNP (last 3 results) Recent Labs    10/28/23 1215  PROBNP 711*    Lipid Profile: No results for input(s): "CHOL", "HDL", "LDLCALC", "TRIG", "CHOLHDL", "LDLDIRECT" in the last 72 hours.  Thyroid Function Tests: No results for input(s): "TSH", "T4TOTAL", "FREET4", "T3FREE", "THYROIDAB" in the last 72 hours.  Anemia Panel: No results for input(s): "VITAMINB12", "FOLATE", "FERRITIN", "TIBC", "IRON", "RETICCTPCT" in the last 72 hours.  Urine analysis:    Component Value Date/Time   COLORURINE AMBER (A) 02/29/2024 0859   APPEARANCEUR CLOUDY (A) 02/29/2024 0859   LABSPEC 1.016 02/29/2024 0859   PHURINE 6.0 02/29/2024 0859   GLUCOSEU NEGATIVE 02/29/2024 0859   GLUCOSEU 100 mg/dL (A) 16/10/9603 5409   HGBUR SMALL (A) 02/29/2024 0859   BILIRUBINUR NEGATIVE 02/29/2024 0859   KETONESUR NEGATIVE 02/29/2024 0859   PROTEINUR NEGATIVE 02/29/2024 0859   UROBILINOGEN 0.2 08/18/2015 0905   NITRITE NEGATIVE 02/29/2024 0859   LEUKOCYTESUR LARGE (A) 02/29/2024 0859    Sepsis Labs: Lactic Acid, Venous    Component Value Date/Time   LATICACIDVEN 3.0 (HH) 02/29/2024 0859    MICROBIOLOGY: Recent Results (from the past 240 hours)  Resp panel by RT-PCR (RSV, Flu A&B, Covid) Anterior Nasal Swab     Status: None   Collection Time: 02/29/24 12:14 AM   Specimen: Anterior Nasal Swab  Result Value Ref Range Status   SARS Coronavirus 2 by RT PCR NEGATIVE NEGATIVE Final   Influenza A by PCR NEGATIVE NEGATIVE Final   Influenza B by PCR NEGATIVE NEGATIVE Final    Comment: (NOTE) The Xpert Xpress SARS-CoV-2/FLU/RSV plus assay is intended as an aid in the diagnosis of influenza from Nasopharyngeal swab  specimens and should not be used as a sole basis for treatment. Nasal washings and aspirates are unacceptable for Xpert Xpress SARS-CoV-2/FLU/RSV testing.  Fact Sheet for Patients: BloggerCourse.com  Fact Sheet for Healthcare Providers: SeriousBroker.it  This test is not yet approved or cleared by the  [place] [employer] and has been authorized for detection and/or diagnosis of SARS-CoV-2 by FDA under an TEFL teacher (EUA). This EUA will remain in effect (meaning this test can be used) for the duration of the COVID-19 declaration under Section 564(b)(1) of the Act, 21 U.S.C. section 360bbb-3(b)(1), unless the authorization is terminated or revoked.     Resp Syncytial Virus by PCR NEGATIVE NEGATIVE Final    Comment: (NOTE) Fact Sheet for Patients: BloggerCourse.com  Fact Sheet for Healthcare Providers: SeriousBroker.it  This test is not yet approved or cleared by the [place] FDA and has been authorized for detection and/or diagnosis of SARS-CoV-2 by FDA under an Emergency Use Authorization (EUA). This EUA will remain in effect (meaning this test can be used) for the duration of the COVID-19 declaration under Section 564(b)(1) of the Act, 21 U.S.C. section 360bbb-3(b)(1), unless the authorization is terminated or revoked.  Performed at [facility], [address], [place], [place] [address]   Blood Culture (routine x 2)     Status: None (Preliminary result)   Collection Time: 02/29/24 12:14 AM   Specimen: BLOOD RIGHT HAND  Result Value Ref Range Status   Specimen Description BLOOD RIGHT HAND  Final   Special Requests   Final    BOTTLES DRAWN AEROBIC AND ANAEROBIC Blood Culture adequate volume   Culture   Final    NO GROWTH 3 DAYS Performed at [facility], [address] [place], [place] [address]    Report Status PENDING  Incomplete   Blood Culture (routine x 2)     Status: None (Preliminary result)   Collection Time: 02/29/24 12:14 AM   Specimen: BLOOD  Result Value Ref Range Status   Specimen Description BLOOD RIGHT ANTECUBITAL  Final   Special Requests   Final    BOTTLES DRAWN AEROBIC AND ANAEROBIC Blood Culture results may not be optimal due to an inadequate volume of blood received in culture bottles   Culture   Final    NO GROWTH 3 DAYS Performed at [facility], [address] [place], [place] [address]    Report Status PENDING  Incomplete  Urine Culture     Status: Abnormal   Collection Time: 02/29/24  8:59 AM   Specimen: Urine, Random  Result Value Ref Range Status   Specimen Description URINE, RANDOM  Final   Special Requests   Final    NONE Reflexed from [id number] Performed at [facility], [address] [place], [place] [address]    Culture MULTIPLE SPECIES PRESENT, SUGGEST RECOLLECTION (A)  Final   Report Status 03/01/2024 FINAL  Final  Gastrointestinal Panel by PCR , Stool     Status: Abnormal   Collection Time: 02/29/24  1:14 PM   Specimen: Stool  Result Value Ref Range Status   Campylobacter species NOT DETECTED NOT DETECTED Final   Plesimonas shigelloides NOT DETECTED NOT DETECTED Final   Salmonella species NOT DETECTED NOT DETECTED Final   Yersinia enterocolitica NOT DETECTED NOT DETECTED Final   Vibrio species NOT DETECTED NOT DETECTED Final   Vibrio cholerae NOT DETECTED NOT DETECTED Final   Enteroaggregative E coli (EAEC) NOT DETECTED NOT DETECTED Final   Enteropathogenic E coli (EPEC) NOT DETECTED NOT DETECTED Final   Enterotoxigenic E coli (ETEC) NOT DETECTED NOT DETECTED Final   Shiga like toxin producing E coli (STEC) NOT DETECTED NOT DETECTED Final   Shigella/Enteroinvasive E coli (EIEC) NOT DETECTED NOT DETECTED Final  Cryptosporidium NOT DETECTED NOT DETECTED Final   Cyclospora cayetanensis NOT DETECTED NOT DETECTED Final   Entamoeba histolytica NOT DETECTED NOT  DETECTED Final   Giardia lamblia NOT DETECTED NOT DETECTED Final   Adenovirus F40/41 NOT DETECTED NOT DETECTED Final   Astrovirus NOT DETECTED NOT DETECTED Final   Norovirus GI/GII DETECTED (A) NOT DETECTED Final    Comment: RESULT CALLED TO, READ BACK BY AND VERIFIED WITH: DELICIA MANUEL AT 1509 03/01/24.PMF    Rotavirus A NOT DETECTED NOT DETECTED Final   Sapovirus (I, II, IV, and V) NOT DETECTED NOT DETECTED Final    Comment: Performed at [facility], [address] [place], [place] [address]  C Difficile Quick Screen w PCR reflex     Status: None   Collection Time: 02/29/24  1:14 PM   Specimen: Stool  Result Value Ref Range Status   C Diff antigen NEGATIVE NEGATIVE Final   C Diff toxin NEGATIVE NEGATIVE Final   C Diff interpretation No C. difficile detected.  Final    Comment: Performed at [facility], [address], [place], [place] [address]    RADIOLOGY STUDIES/RESULTS: No results found.    LOS: 3 days   Jeoffrey Massed, MD  [facility]    To contact the attending provider between 7A-7P or the covering provider during after hours 7P-7A, please log into the web site [email or web address] and access using universal [place] password for that web site. If you do not have the password, please call the hospital operator.  03/03/2024, 10:53 AM

## 2024-03-03 NOTE — TOC Progression Note (Signed)
 Transition of Care [facility]) - Progression Note    Patient Details  Name: [name] [name] MRN: [id number] Date of Birth: 12-05-1941  Transition of [facility]) CM/SW Contact  Mearl Latin, LCSW Phone Number: 03/03/2024, 10:14 AM  Clinical Narrative:    CSW updated patient's daughter on SNF options. She would like to hear back from [facility] and [facility]. Awaiting response.    Expected Discharge Plan: Skilled Nursing Facility Barriers to Discharge: Continued Medical Work up, English as a second language teacher  Expected Discharge Plan and Services In-house Referral: Clinical Social Work, Hospice / Palliative Care   Post Acute Care Choice: Skilled Nursing Facility Living arrangements for the past 2 months: Single Family Home                                       Social Determinants of Health (SDOH) Interventions SDOH Screenings   Food Insecurity: Patient Unable To Answer (02/29/2024)  Housing: Patient Unable To Answer (02/29/2024)  Transportation Needs: No Transportation Needs (02/25/2024)  Utilities: Not At Risk (02/25/2024)  Alcohol Screen: Low Risk  (06/24/2023)  Depression (PHQ2-9): Medium Risk (02/28/2024)  Financial Resource Strain: Low Risk  (06/24/2023)  Physical Activity: Inactive (06/24/2023)  Social Connections: Moderately Integrated (06/24/2023)  Stress: No Stress Concern Present (06/24/2023)  Tobacco Use: Medium Risk (02/29/2024)    Readmission Risk Interventions     No data to display

## 2024-03-03 NOTE — Plan of Care (Signed)
  Problem: Education: Goal: Knowledge of General Education information will improve Description: Including pain rating scale, medication(s)/side effects and non-pharmacologic comfort measures Outcome: Progressing   Problem: Clinical Measurements: Goal: Ability to maintain clinical measurements within normal limits will improve Outcome: Progressing Goal: Will remain free from infection Outcome: Progressing Goal: Diagnostic test results will improve Outcome: Progressing   Problem: Activity: Goal: Risk for activity intolerance will decrease Outcome: Progressing   Problem: Coping: Goal: Level of anxiety will decrease Outcome: Progressing   Problem: Pain Managment: Goal: General experience of comfort will improve and/or be controlled Outcome: Progressing

## 2024-03-03 NOTE — Progress Notes (Signed)
 [facility] [phone] [facility] liaison note:  Patient revoked hospice benefit effective 3.4.25 in order to seek skilled rehab.   An outpatient palliative referral will be initiated per request of family.   Thank you for the opportunity to participate in this patient's plan of care. Please don't hesitate to reach out for any questions or concerns. Thea Gist, BSN, [employer] [phone]

## 2024-03-03 NOTE — Plan of Care (Signed)

## 2024-03-04 DIAGNOSIS — E119 Type 2 diabetes mellitus without complications: Secondary | ICD-10-CM | POA: Diagnosis not present

## 2024-03-04 DIAGNOSIS — A0811 Acute gastroenteropathy due to Norwalk agent: Secondary | ICD-10-CM | POA: Diagnosis not present

## 2024-03-04 DIAGNOSIS — L8990 Pressure ulcer of unspecified site, unspecified stage: Secondary | ICD-10-CM | POA: Insufficient documentation

## 2024-03-04 LAB — BASIC METABOLIC PANEL
Anion gap: 7 (ref 5–15)
BUN: 11 mg/dL (ref 8–23)
CO2: 25 mmol/L (ref 22–32)
Calcium: 8 mg/dL — ABNORMAL LOW (ref 8.9–10.3)
Chloride: 107 mmol/L (ref 98–111)
Creatinine, Ser: 0.77 mg/dL (ref 0.61–1.24)
GFR, Estimated: >60 mL/min (ref >60)
Glucose, Bld: 118 mg/dL — ABNORMAL HIGH (ref 70–99)
Potassium: 4 mmol/L (ref 3.5–5.1)
Sodium: 139 mmol/L (ref 135–145)

## 2024-03-04 LAB — GLUCOSE, CAPILLARY
Glucose-Capillary: 112 mg/dL — ABNORMAL HIGH (ref 70–99)
Glucose-Capillary: 123 mg/dL — ABNORMAL HIGH (ref 70–99)
Glucose-Capillary: 117 mg/dL — ABNORMAL HIGH (ref 70–99)

## 2024-03-04 NOTE — Plan of Care (Signed)

## 2024-03-04 NOTE — TOC Progression Note (Addendum)
 Transition of Care [facility]) - Progression Note    Patient Details  Name: [name] [name] MRN: [id number] Date of Birth: 02-23-1941  Transition of Care [facility]) CM/SW Contact  Mearl Latin, LCSW Phone Number: 03/04/2024, 8:43 AM  Clinical Narrative:    8:43am-Awaiting response from [facility]; they are checking on their Norovirus protocol. Hospice has revoked their benefit so patient's insurance should revert to Medicare A.   1:45 PM-[facility] able to accept patient today pending receiving a copy of the Hospice revocation form. CSW updated daughter and she will bring clothes by 3:30p. Requesting [facility] for transport.    Expected Discharge Plan: Skilled Nursing Facility Barriers to Discharge: Continued Medical Work up, English as a second language teacher  Expected Discharge Plan and Services In-house Referral: Clinical Social Work, Hospice / Palliative Care   Post Acute Care Choice: Skilled Nursing Facility Living arrangements for the past 2 months: Single Family Home                                       Social Determinants of Health (SDOH) Interventions SDOH Screenings   Food Insecurity: Patient Unable To Answer (02/29/2024)  Housing: Patient Unable To Answer (02/29/2024)  Transportation Needs: No Transportation Needs (02/25/2024)  Utilities: Not At Risk (02/25/2024)  Alcohol Screen: Low Risk  (06/24/2023)  Depression (PHQ2-9): Medium Risk (02/28/2024)  Financial Resource Strain: Low Risk  (06/24/2023)  Physical Activity: Inactive (06/24/2023)  Social Connections: Moderately Integrated (06/24/2023)  Stress: No Stress Concern Present (06/24/2023)  Tobacco Use: Medium Risk (02/29/2024)    Readmission Risk Interventions     No data to display

## 2024-03-04 NOTE — Plan of Care (Signed)
  Problem: Education: Goal: Knowledge of General Education information will improve Description: Including pain rating scale, medication(s)/side effects and non-pharmacologic comfort measures Outcome: Progressing   Problem: Clinical Measurements: Goal: Ability to maintain clinical measurements within normal limits will improve Outcome: Progressing Goal: Will remain free from infection Outcome: Progressing Goal: Diagnostic test results will improve Outcome: Progressing   Problem: Activity: Goal: Risk for activity intolerance will decrease Outcome: Progressing   Problem: Nutrition: Goal: Adequate nutrition will be maintained Outcome: Progressing   Problem: Coping: Goal: Level of anxiety will decrease Outcome: Progressing   

## 2024-03-04 NOTE — Discharge Summary (Addendum)
 PATIENT DETAILS Name: [name] [name] Age: 83 y.o. Sex: male Date of Birth: Dec 17, 1941 MRN: [id number]. Admitting Physician: Tereasa Coop, MD NWG:[id number], Keane Scrape, NP  Admit Date: 02/28/2024 Discharge date: 03/04/2024  Recommendations for Outpatient Follow-up:  Follow up with PCP in 1-2 weeks Please obtain CMP/CBC in one week  Admitted From:  Home  Disposition: Skilled nursing facility   Discharge Condition: good  CODE STATUS:   Code Status: Full Code   Diet recommendation:  Diet Order             Diet general           Diet - low sodium heart healthy           DIET DYS 3 Room service appropriate? Yes; Fluid consistency: Nectar Thick  Diet effective now                    Brief Summary: Patient is a 83 y.o.  male with history of hereditary hemochromatosis, DM-2, HTN, HLD, CAD, chronic combined HFpEF/HFrEF, TIA-followed at home by hospice-presented with generalized weakness/lethargy.  Patient was found to have sepsis secondary to norovirus gastroenteritis and acute metabolic encephalopathy.  See below for further details.   Significant events: 2/28>> admit to [facility]   Significant studies: 2/28>> MRI brain: No acute intracranial abnormality. 2/28>> CXR: No PNA.   Significant microbiology data: 3/01>> COVID/influenza/RSV PCR: Negative 3/01>> stool C. difficile studies: Negative 3/01>> GI pathogen panel: Norovirus 3/01>> blood culture: No growth   Procedures: None   Consults: Palliative care   Brief Hospital Course: Sepsis Likely due to norovirus related gastroenteritis antibiotics have been discontinued Diarrhea has essentially resolved with as needed Imodium and other supportive care.   Acute metabolic encephalopathy Likely secondary to sepsis/dehydration from diarrhea Although improved-continues to have lingering hospital delirium Neuroimaging negative Delirium precautions   AKI Likely due to urinary retention Mild Has resolved     Acute urinary retention Continue Flomax Foley removed 3/4-now voiding spontaneously.   Hypokalemia Secondary to diarrhea Adequate.   Hypomagnesemia Repleted.  Adequate.   Hypophosphatemia Repleted.  Adequate.   CAD No anginal symptoms Plavix/statin   Chronic HFpEF Euvolemic on exam-some chronic leg edema Lasix will resume on discharge.   HLD Statin   DM-2 (A1c 7.2 on 05/29/2023) SSI in the hospital. No insulin needed on discharge .  Diet controlled.  Encourage supplemental nutrition.  Hereditary hemochromatosis Reviewed last oncology/hematology note-no further therapeutic phlebotomy planned Has been established with home hospice but currently discharged to skilled nursing facility.   Debility/deconditioning Per daughter-significant decline in function over the past 4 weeks-before this he was able to ambulate with the help of walker PT/OT eval SNF on discharge.   Goals of care Full code Follows with home hospice in the outpatient setting Frail/debilitated-do not think he is a good candidate for aggressive care if he were to deteriorate/decompensate Palliative care following.  May benefit with ongoing palliative and hospice follow-up at a SNF.   Pressure Ulcer:  Pressure Injury 02/29/24 Buttocks Right;Left;Mid Stage 1 -  Intact skin with non-blanchable redness of a localized area usually over a bony prominence. (Active)  02/29/24 1500  Location: Buttocks  Location Orientation: Right;Left;Mid  Staging: Stage 1 -  Intact skin with non-blanchable redness of a localized area usually over a bony prominence.  Wound Description (Comments):   Present on Admission: Yes  Dressing Type Foam - Lift dressing to assess site every shift 03/03/24 2030    Discharge Diagnoses:  Principal Problem:  Sepsis (HCC) Active Problems:   Acute urinary retention   AKI (acute kidney injury) (HCC)   Non-insulin dependent type 2 diabetes mellitus (HCC)   Hyperlipidemia   History of TIA  (transient ischemic attack)   Hereditary hemochromatosis (HCC)   Dysarthria   Chronic combined systolic and diastolic CHF, NYHA class 1 (HCC)   History of CAD (coronary artery disease)   Generalized weakness   Discharge Instructions:  Activity:  As tolerated with Full fall precautions use walker/cane & assistance as needed  Discharge Instructions     Call MD for:  extreme fatigue   Complete by: As directed    Call MD for:  persistant dizziness or light-headedness   Complete by: As directed    Diet - low sodium heart healthy   Complete by: As directed    Diet general   Complete by: As directed    Discharge instructions   Complete by: As directed    Follow with Primary MD  Doreene Nest, NP in 1-2 weeks  Please get a complete blood count and chemistry panel checked by your Primary MD at your next visit, and again as instructed by your Primary MD.  Get Medicines reviewed and adjusted: Please take all your medications with you for your next visit with your Primary MD  Laboratory/radiological data: Please request your Primary MD to go over all hospital tests and procedure/radiological results at the follow up, please ask your Primary MD to get all Hospital records sent to his/her office.  In some cases, they will be blood work, cultures and biopsy results pending at the time of your discharge. Please request that your primary care M.D. follows up on these results.  Also Note the following: If you experience worsening of your admission symptoms, develop shortness of breath, life threatening emergency, suicidal or homicidal thoughts you must seek medical attention immediately by calling 911 or calling your MD immediately  if symptoms less severe.  You must read complete instructions/literature along with all the possible adverse reactions/side effects for all the Medicines you take and that have been prescribed to you. Take any new Medicines after you have completely understood  and accpet all the possible adverse reactions/side effects.   Do not drive when taking Pain medications or sleeping medications (Benzodaizepines)  Do not take more than prescribed Pain, Sleep and Anxiety Medications. It is not advisable to combine anxiety,sleep and pain medications without talking with your primary care practitioner  Special Instructions: If you have smoked or chewed Tobacco  in the last 2 yrs please stop smoking, stop any regular Alcohol  and or any Recreational drug use.  Wear Seat belts while driving.  Please note: You were cared for by a hospitalist during your hospital stay. Once you are discharged, your primary care physician will handle any further medical issues. Please note that NO REFILLS for any discharge medications will be authorized once you are discharged, as it is imperative that you return to your primary care physician (or establish a relationship with a primary care physician if you do not have one) for your post hospital discharge needs so that they can reassess your need for medications and monitor your lab values.   Discharge wound care:   Complete by: As directed    Protect pressure points   Increase activity slowly   Complete by: As directed    Increase activity slowly   Complete by: As directed    No wound care   Complete by: As directed  Allergies as of 03/04/2024       Reactions   Niacin Other (See Comments)   intol to NIACIN w/ headaches   Ativan [lorazepam] Other (See Comments)   agitation        Medication List     TAKE these medications    Accu-Chek Guide test strip Generic drug: glucose blood Use as instructed   atorvastatin 10 MG tablet Commonly known as: LIPITOR TAKE 1 TABLET BY MOUTH EVERY DAY FOR CHOLESTEROL   cholecalciferol 25 MCG (1000 UNIT) tablet Commonly known as: VITAMIN D3 Take 1,000 Units by mouth daily.   clopidogrel 75 MG tablet Commonly known as: PLAVIX TAKE 1 TABLET BY MOUTH EVERY DAY    cyanocobalamin 500 MCG tablet Commonly known as: VITAMIN B12 Take 500 mcg by mouth daily.   furosemide 20 MG tablet Commonly known as: LASIX Take 20 mg by mouth daily as needed for edema.   tamsulosin 0.4 MG Caps capsule Commonly known as: FLOMAX Take 1 capsule (0.4 mg total) by mouth daily.               Discharge Care Instructions  (From admission, onward)           Start     Ordered   03/04/24 0000  Discharge wound care:       Comments: Protect pressure points   03/04/24 1313            Follow-up Information     Doreene Nest, NP. Schedule an appointment as soon as possible for a visit in 1 week(s).   Specialty: Internal Medicine Contact information: [address] [place] [address] [phone]                Allergies  Allergen Reactions   Niacin Other (See Comments)    intol to NIACIN w/ headaches    Ativan [Lorazepam] Other (See Comments)    agitation     Other Procedures/Studies: DG Swallowing Func-Speech Pathology Result Date: 03/03/2024 Table formatting from the original result was not included. Modified Barium Swallow Study Patient Details Name: [name] [name] MRN: [id number] Date of Birth: 1941/01/28 Today's Date: 03/03/2024 HPI/PMH: HPI: 83 yo male adm to [facility] with AMS. 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  Swallow eval ordered.   Most recent swallow evaluation in [facility] was 2023 with recommendatino for chin tuck and head turn left with single sips.  Wife reported at that time that the pt was not able to follow those directions.  MRI showed many remote lacunar bilteral basal ganglia, thalami, pons CVAs.  CXR negative for acute finding.  Pt reports he continues to use thickener at home - and it sounds as if it's nectar thick.  He is on precautions for potential Cdif. MBS today for potential upgrade in liquid consistency. Clinical Impression: Clinical Impression: MBS performed to determine  possible liquid upgrade. Pt demonstrated pharyngeal dysphagia with a DIGEST score of 2 (moderate impairment). His pharyngeal strength and range of motion of larynx, hyoid and glottal closure were adequate. The timing of his swallow was delayed with barium reaching his valleculae with thicker consistencies and pyriform sinuses with thin. There were two  instances, one with thin and one with larger sip of nectar where barium spilled to the vocal cords prior to closure of larynx and was aspirated during the swallow (immediate cough with thin and delayed with nectar) that did not clear aspirates. Verbal cue for smaller sip of nectar  was effective. There was trace-mild vallecular residue with thin and nectar consistencies. A left head turn and chin tuck was performed multiple times and was effective in preventing penetration/aspiration in all but one instance with penetration using straw (PAS 3). This cleared when cued to produce cough. Pt's wife was called, with pt's permission and although left head turn and chin tuck was effective, his wife stated that he has been unable to remember to do the strategy in the past when recommended and she is unable to provide 100% supervision. Wife prefers pt remain on nectar thick. His mastication was functional with solid however suspect he may be impulsive and with cognitive impairments recommend he continue with Dys 3 texture and will continue nectar thick, meds whole in puree and remain upright after meals. Esophageal scan revealed what appeared to be mild stasis mid esophagus. ST will continue while on acute. Pt has had outpatient ST in the past and dysphagia appears chronic since at least 2023 and not certain outpatient ST would make a significant difference. DIGEST Swallow Severity Rating*  Safety:  Efficiency:  Overall Pharyngeal Swallow Severity: 1: mild; 2: moderate; 3: severe; 4: profound *The Dynamic Imaging Grade of Swallowing Toxicity is standardized for the head and  neck cancer population, however, demonstrates promising clinical applications across populations to standardize the clinical rating of pharyngeal swallow safety and severity. Factors that may increase risk of adverse event in presence of aspiration Rubye Oaks & Clearance Coots 2021): Factors that may increase risk of adverse event in presence of aspiration Rubye Oaks & Clearance Coots 2021): Reduced cognitive function Recommendations/Plan: Swallowing Evaluation Recommendations Swallowing Evaluation Recommendations Recommendations: PO diet PO Diet Recommendation: Dysphagia 3 (Mechanical soft); Mildly thick liquids (Level 2, nectar thick) (wife does not want pt to return to thin liquids due to decreased memory to perform strategies- see impression statement) Liquid Administration via: Cup; Straw Medication Administration: Whole meds with puree Supervision: Patient able to self-feed; Full supervision/cueing for swallowing strategies Swallowing strategies  : Slow rate; Small bites/sips Postural changes: Stay upright 30-60 min after meals; Position pt fully upright for meals Oral care recommendations: Oral care BID (2x/day) Treatment Plan Treatment Plan Treatment recommendations: Therapy as outlined in treatment plan below Follow-up recommendations: Follow physicians's recommendations for discharge plan and follow up therapies (pt has had outpatient therapy in the past) Functional status assessment: Patient has had a recent decline in their functional status and demonstrates the ability to make significant improvements in function in a reasonable and predictable amount of time. Treatment frequency: Min 2x/week Treatment duration: 2 weeks Interventions: Diet toleration management by SLP; Compensatory techniques; Patient/family education Recommendations Recommendations for follow up therapy are one component of a multi-disciplinary discharge planning process, led by the attending physician.  Recommendations may be updated based on patient  status, additional functional criteria and insurance authorization. Assessment: Orofacial Exam: Orofacial Exam Oral Cavity: Oral Hygiene: WFL Oral Cavity - Dentition: Adequate natural dentition Orofacial Anatomy: WFL Oral Motor/Sensory Function: WFL Anatomy: Anatomy: WFL Boluses Administered: Boluses Administered Boluses Administered: Thin liquids (Level 0); Mildly thick liquids (Level 2, nectar thick); Moderately thick liquids (Level 3, honey thick); Puree; Solid  Oral Impairment Domain: Oral Impairment Domain Lip Closure: No labial escape Tongue control during bolus hold: Posterior escape of greater than half of bolus Bolus preparation/mastication: Timely and efficient chewing and mashing Bolus transport/lingual motion: Brisk tongue motion Oral residue: Complete oral clearance Location of oral residue : N/A Initiation of pharyngeal swallow : Pyriform sinuses  Pharyngeal Impairment Domain: Pharyngeal Impairment Domain Soft palate  elevation: No bolus between soft palate (SP)/pharyngeal wall (PW) Laryngeal elevation: Complete superior movement of thyroid cartilage with complete approximation of arytenoids to epiglottic petiole Anterior hyoid excursion: Complete anterior movement Epiglottic movement: Complete inversion Laryngeal vestibule closure: Complete, no air/contrast in laryngeal vestibule Pharyngeal stripping wave : Present - complete Pharyngeal contraction (A/P view only): N/A Pharyngoesophageal segment opening: Complete distension and complete duration, no obstruction of flow Tongue base retraction: No contrast between tongue base and posterior pharyngeal wall (PPW) Pharyngeal residue: Collection of residue within or on pharyngeal structures Location of pharyngeal residue: Valleculae  Esophageal Impairment Domain: Esophageal Impairment Domain Esophageal clearance upright position: Esophageal retention (mild) Pill: No data recorded Penetration/Aspiration Scale Score: Penetration/Aspiration Scale Score 1.   Material does not enter airway: Solid; Puree; Moderately thick liquids (Level 3, honey thick) 7.  Material enters airway, passes BELOW cords and not ejected out despite cough attempt by patient: Thin liquids (Level 0); Mildly thick liquids (Level 2, nectar thick) Compensatory Strategies: Compensatory Strategies Compensatory strategies: Yes Chin tuck: Effective Effective Chin Tuck: Thin liquid (Level 0) (chin tuck in combination with left head turn) Left head turn: Effective Effective Left Head Turn: Thin liquid (Level 0)   General Information: Caregiver present: No  Diet Prior to this Study: Dysphagia 3 (mechanical soft); Mildly thick liquids (Level 2, nectar thick)   Temperature : Normal   Respiratory Status: WFL   Supplemental O2: None (Room air)   History of Recent Intubation: No  Behavior/Cognition: Alert; Cooperative; Pleasant mood; Other (Comment) (has memory deficits) Self-Feeding Abilities: Able to self-feed Baseline vocal quality/speech: Normal Volitional Cough: Able to elicit Volitional Swallow: Able to elicit Exam Limitations: No limitations Goal Planning: Prognosis for improved oropharyngeal function: -- (fair-good) Barriers to Reach Goals: Cognitive deficits No data recorded No data recorded Consulted and agree with results and recommendations: Patient; Family member/caregiver; Physician; Nurse Pain: Pain Assessment Pain Assessment: Faces Faces Pain Scale: 0 End of Session: Start Time:SLP Start Time (ACUTE ONLY): 1230 Stop Time: SLP Stop Time (ACUTE ONLY): 1247 Time Calculation:SLP Time Calculation (min) (ACUTE ONLY): 17 min Charges: SLP Evaluations $ SLP Speech Visit: 1 Visit SLP Evaluations $MBS Swallow: 1 Procedure $Swallowing Treatment: 1 Procedure SLP visit diagnosis: SLP Visit Diagnosis: Dysphagia, pharyngeal phase (R13.13) Past Medical History: Past Medical History: Diagnosis Date  Aspiration pneumonia vs. CAP 02/28/2022  Basal cell carcinoma 11/09/2019  nod & infil-behind right ear-cx3 &exc   Basal cell carcinoma 03/21/2020  Residual BCC with peripheral margin involved - ST recommends MOHs  Carotid artery occlusion   Coronary artery disease   s/p CABG 2005; sees Dr Eden Emms yearly  Diabetes mellitus without complication (HCC)   Gynecomastia   History of COVID-19 01/08/2022  Hypercholesterolemia   Hypertension   Neurodermatitis   Overweight(278.02)   Personal history of colonic polyps 10/08/2006  tubular adenomas  Renal insufficiency   Stroke [facility])   TIA  April 2022  Transient ischemic attack 2010  "lasted ~ 5 seconds"  Vitamin D deficiency  Past Surgical History: Past Surgical History: Procedure Laterality Date  CARDIAC CATHETERIZATION  02/2004  "tried to stent; couldn't"  CAROTID ENDARTERECTOMY Right 08/26/2015  COLONOSCOPY    CORONARY ANGIOPLASTY    CORONARY ARTERY BYPASS GRAFT  Feb. 2005  4 vessel  ENDARTERECTOMY Right 08/26/2015  Procedure: Right Carotid ENDARTERECTOMY with Patch Angioplasty ;  Surgeon: Larina Earthly, MD;  Location: [facility] OR;  Service: Vascular;  Laterality: Right;  ENDARTERECTOMY Left 05/18/2021  Procedure: LEFT CAROTID ARTERY ENDARTERECTOMY  with patch angioplasty;  Surgeon: Arbie Cookey,  Kristen Loader, MD;  Location: [facility] OR;  Service: Vascular;  Laterality: Left;  EYE SURGERY    bilateral cataract  KELOID EXCISION  04/2008  on chest scar; Dr. Stephens November  KELOID EXCISION    PILONIDAL CYST EXCISION  1989 Royce Macadamia 03/03/2024, 2:44 PM  MR BRAIN WO CONTRAST Result Date: 02/29/2024 CLINICAL DATA:  Initial evaluation for acute TIA. EXAM: MRI HEAD WITHOUT CONTRAST TECHNIQUE: Multiplanar, multiecho pulse sequences of the brain and surrounding structures were obtained without intravenous contrast. COMPARISON:  MRI from 08/21/2022 FINDINGS: Brain: Examination moderately degraded by motion artifact. Generalized age-related cerebral atrophy. Patchy and confluent T2/FLAIR hyperintensity involving the periventricular deep white matter both cerebral hemispheres as well as the pons, consistent with chronic  small vessel ischemic disease, moderate to advanced in nature. Multiple remote lacunar infarcts present about the bilateral basal ganglia, thalami, and pons. No abnormal foci of restricted diffusion to suggest acute or subacute ischemia. Gray-white matter differentiation maintained. No areas of chronic cortical infarction. No acute intracranial hemorrhage. 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 Few small chronic micro hemorrhages noted within the right cerebral hemisphere. No mass lesion, midline shift or mass effect. Mild ventricular prominence related to global parenchymal volume loss without hydrocephalus. No extra-axial fluid collection. Pituitary gland within normal limits. Vascular: Major intracranial vascular flow voids are grossly maintained on this motion degraded exam. The right vertebral artery is not well seen. Skull and upper cervical spine: Craniocervical junction within normal limits. Bone marrow signal intensity grossly normal. No scalp soft tissue abnormality. Sinuses/Orbits: Prior bilateral ocular lens replacement. Scattered mucosal thickening noted about the maxillary sinuses. Paranasal sinuses are otherwise largely clear. No significant mastoid effusion. Other: None. IMPRESSION: 1. Motion degraded exam. 2. No acute intracranial abnormality. 3. Age-related cerebral atrophy with moderate to advanced chronic microvascular ischemic disease, with multiple remote lacunar infarcts about the bilateral basal ganglia, thalami, and pons. Electronically Signed   By: Rise Mu M.D.   On: 02/29/2024 03:42   DG Chest Port 1 View Result Date: 02/29/2024 CLINICAL DATA:  Weakness EXAM: PORTABLE CHEST 1 VIEW COMPARISON:  10/13/2023 FINDINGS: Post sternotomy changes. Enlarged cardiomediastinal silhouette. Elevation of the right diaphragm. No acute airspace disease, pleural effusion or pneumothorax IMPRESSION: No active disease.   Cardiomegaly Electronically Signed   By: Jasmine Pang M.D.   On: 02/29/2024 00:05     TODAY-DAY OF DISCHARGE:  Subjective:   Patient seen and examined.  Wife was on the FaceTime with patient.  Patient with some tangential thoughts but able to tell me he feels fine.  Denies any nausea vomiting.  He was put on chair for 2 hours and he felt tired. Wife on the phone agreeable to rehab placement.  Objective:   Blood pressure (!) 142/66, pulse 67, temperature 98.1 F (36.7 C), temperature source Oral, resp. rate (!) 24, height 6' (1.829 m), weight 94.8 kg, SpO2 92%.  Intake/Output Summary (Last 24 hours) at 03/04/2024 1319 Last data filed at 03/03/2024 1743 Gross per 24 hour  Intake 240 ml  Output --  Net 240 ml   Filed Weights   02/28/24 2300  Weight: 94.8 kg    Exam: Awake Alert, mostly oriented.  Flat affect. No new F.N deficits,  Frail and debilitated. Supple Neck,No JVD, No cervical lymphadenopathy appriciated.  Symmetrical Chest wall movement, Good air movement bilaterally, CTAB RRR,No Gallops,Rubs or new Murmurs, No Parasternal Heave +ve B.Sounds, Abd Soft, Non tender, No organomegaly appriciated, No rebound -guarding or  rigidity. No Cyanosis, Clubbing or edema, No new Rash or bruise   PERTINENT RADIOLOGIC STUDIES: DG Swallowing Func-Speech Pathology Result Date: 03/03/2024 Table formatting from the original result was not included. Modified Barium Swallow Study Patient Details Name: [name] [name] MRN: [id number] Date of Birth: [birth year] Today's Date: 03/03/2024 HPI/PMH: HPI: 83 yo male adm to [facility] with AMS. 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  Swallow eval ordered.   Most recent swallow evaluation in [facility] was 2023 with recommendatino for chin tuck and head turn left with single sips.  Wife reported at that time that the pt was not able to follow those directions.  MRI showed many remote lacunar bilteral basal ganglia, thalami, pons CVAs.  CXR  negative for acute finding.  Pt reports he continues to use thickener at home - and it sounds as if it's nectar thick.  He is on precautions for potential Cdif. MBS today for potential upgrade in liquid consistency. Clinical Impression: Clinical Impression: MBS performed to determine possible liquid upgrade. Pt demonstrated pharyngeal dysphagia with a DIGEST score of 2 (moderate impairment). His pharyngeal strength and range of motion of larynx, hyoid and glottal closure were adequate. The timing of his swallow was delayed with barium reaching his valleculae with thicker consistencies and pyriform sinuses with thin. There were two  instances, one with thin and one with larger sip of nectar where barium spilled to the vocal cords prior to closure of larynx and was aspirated during the swallow (immediate cough with thin and delayed with nectar) that did not clear aspirates. Verbal cue for smaller sip of nectar was effective. There was trace-mild vallecular residue with thin and nectar consistencies. A left head turn and chin tuck was performed multiple times and was effective in preventing penetration/aspiration in all but one instance with penetration using straw (PAS 3). This cleared when cued to produce cough. Pt's wife was called, with pt's permission and although left head turn and chin tuck was effective, his wife stated that he has been unable to remember to do the strategy in the past when recommended and she is unable to provide 100% supervision. Wife prefers pt remain on nectar thick. His mastication was functional with solid however suspect he may be impulsive and with cognitive impairments recommend he continue with Dys 3 texture and will continue nectar thick, meds whole in puree and remain upright after meals. Esophageal scan revealed what appeared to be mild stasis mid esophagus. ST will continue while on acute. Pt has had outpatient ST in the past and dysphagia appears chronic since at least 2023 and  not certain outpatient ST would make a significant difference. DIGEST Swallow Severity Rating*  Safety:  Efficiency:  Overall Pharyngeal Swallow Severity: 1: mild; 2: moderate; 3: severe; 4: profound *The Dynamic Imaging Grade of Swallowing Toxicity is standardized for the head and neck cancer population, however, demonstrates promising clinical applications across populations to standardize the clinical rating of pharyngeal swallow safety and severity. Factors that may increase risk of adverse event in presence of aspiration Rubye Oaks & Clearance Coots 2021): Factors that may increase risk of adverse event in presence of aspiration Rubye Oaks & Clearance Coots 2021): Reduced cognitive function Recommendations/Plan: Swallowing Evaluation Recommendations Swallowing Evaluation Recommendations Recommendations: PO diet PO Diet Recommendation: Dysphagia 3 (Mechanical soft); Mildly thick liquids (Level 2, nectar thick) (wife does not want pt to return to thin liquids due to decreased memory to perform strategies- see impression statement) Liquid Administration via: Cup; Straw Medication Administration: Whole  meds with puree Supervision: Patient able to self-feed; Full supervision/cueing for swallowing strategies Swallowing strategies  : Slow rate; Small bites/sips Postural changes: Stay upright 30-60 min after meals; Position pt fully upright for meals Oral care recommendations: Oral care BID (2x/day) Treatment Plan Treatment Plan Treatment recommendations: Therapy as outlined in treatment plan below Follow-up recommendations: Follow physicians's recommendations for discharge plan and follow up therapies (pt has had outpatient therapy in the past) Functional status assessment: Patient has had a recent decline in their functional status and demonstrates the ability to make significant improvements in function in a reasonable and predictable amount of time. Treatment frequency: Min 2x/week Treatment duration: 2 weeks Interventions: Diet  toleration management by SLP; Compensatory techniques; Patient/family education Recommendations Recommendations for follow up therapy are one component of a multi-disciplinary discharge planning process, led by the attending physician.  Recommendations may be updated based on patient status, additional functional criteria and insurance authorization. Assessment: Orofacial Exam: Orofacial Exam Oral Cavity: Oral Hygiene: WFL Oral Cavity - Dentition: Adequate natural dentition Orofacial Anatomy: WFL Oral Motor/Sensory Function: WFL Anatomy: Anatomy: WFL Boluses Administered: Boluses Administered Boluses Administered: Thin liquids (Level 0); Mildly thick liquids (Level 2, nectar thick); Moderately thick liquids (Level 3, honey thick); Puree; Solid  Oral Impairment Domain: Oral Impairment Domain Lip Closure: No labial escape Tongue control during bolus hold: Posterior escape of greater than half of bolus Bolus preparation/mastication: Timely and efficient chewing and mashing Bolus transport/lingual motion: Brisk tongue motion Oral residue: Complete oral clearance Location of oral residue : N/A Initiation of pharyngeal swallow : Pyriform sinuses  Pharyngeal Impairment Domain: Pharyngeal Impairment Domain Soft palate elevation: No bolus between soft palate (SP)/pharyngeal wall (PW) Laryngeal elevation: Complete superior movement of thyroid cartilage with complete approximation of arytenoids to epiglottic petiole Anterior hyoid excursion: Complete anterior movement Epiglottic movement: Complete inversion Laryngeal vestibule closure: Complete, no air/contrast in laryngeal vestibule Pharyngeal stripping wave : Present - complete Pharyngeal contraction (A/P view only): N/A Pharyngoesophageal segment opening: Complete distension and complete duration, no obstruction of flow Tongue base retraction: No contrast between tongue base and posterior pharyngeal wall (PPW) Pharyngeal residue: Collection of residue within or on  pharyngeal structures Location of pharyngeal residue: Valleculae  Esophageal Impairment Domain: Esophageal Impairment Domain Esophageal clearance upright position: Esophageal retention (mild) Pill: No data recorded Penetration/Aspiration Scale Score: Penetration/Aspiration Scale Score 1.  Material does not enter airway: Solid; Puree; Moderately thick liquids (Level 3, honey thick) 7.  Material enters airway, passes BELOW cords and not ejected out despite cough attempt by patient: Thin liquids (Level 0); Mildly thick liquids (Level 2, nectar thick) Compensatory Strategies: Compensatory Strategies Compensatory strategies: Yes Chin tuck: Effective Effective Chin Tuck: Thin liquid (Level 0) (chin tuck in combination with left head turn) Left head turn: Effective Effective Left Head Turn: Thin liquid (Level 0)   General Information: Caregiver present: No  Diet Prior to this Study: Dysphagia 3 (mechanical soft); Mildly thick liquids (Level 2, nectar thick)   Temperature : Normal   Respiratory Status: WFL   Supplemental O2: None (Room air)   History of Recent Intubation: No  Behavior/Cognition: Alert; Cooperative; Pleasant mood; Other (Comment) (has memory deficits) Self-Feeding Abilities: Able to self-feed Baseline vocal quality/speech: Normal Volitional Cough: Able to elicit Volitional Swallow: Able to elicit Exam Limitations: No limitations Goal Planning: Prognosis for improved oropharyngeal function: -- (fair-good) Barriers to Reach Goals: Cognitive deficits No data recorded No data recorded Consulted and agree with results and recommendations: Patient; Family member/caregiver; Physician; Nurse Pain: Pain Assessment  Pain Assessment: Faces Faces Pain Scale: 0 End of Session: Start Time:SLP Start Time (ACUTE ONLY): 1230 Stop Time: SLP Stop Time (ACUTE ONLY): 1247 Time Calculation:SLP Time Calculation (min) (ACUTE ONLY): 17 min Charges: SLP Evaluations $ SLP Speech Visit: 1 Visit SLP Evaluations $MBS Swallow: 1 Procedure  $Swallowing Treatment: 1 Procedure SLP visit diagnosis: SLP Visit Diagnosis: Dysphagia, pharyngeal phase (R13.13) Past Medical History: Past Medical History: Diagnosis Date  Aspiration pneumonia vs. CAP 02/28/2022  Basal cell carcinoma 11/09/2019  nod & infil-behind right ear-cx3 &exc  Basal cell carcinoma 03/21/2020  Residual BCC with peripheral margin involved - ST recommends MOHs  Carotid artery occlusion   Coronary artery disease   s/p CABG 2005; sees Dr Eden Emms yearly  Diabetes mellitus without complication (HCC)   Gynecomastia   History of COVID-19 01/08/2022  Hypercholesterolemia   Hypertension   Neurodermatitis   Overweight(278.02)   Personal history of colonic polyps 10/08/2006  tubular adenomas  Renal insufficiency   Stroke [facility])   TIA  April 2022  Transient ischemic attack 2010  "lasted ~ 5 seconds"  Vitamin D deficiency  Past Surgical History: Past Surgical History: Procedure Laterality Date  CARDIAC CATHETERIZATION  02/2004  "tried to stent; couldn't"  CAROTID ENDARTERECTOMY Right 08/26/2015  COLONOSCOPY    CORONARY ANGIOPLASTY    CORONARY ARTERY BYPASS GRAFT  Feb. 2005  4 vessel  ENDARTERECTOMY Right 08/26/2015  Procedure: Right Carotid ENDARTERECTOMY with Patch Angioplasty ;  Surgeon: Larina Earthly, MD;  Location: [facility] OR;  Service: Vascular;  Laterality: Right;  ENDARTERECTOMY Left 05/18/2021  Procedure: LEFT CAROTID ARTERY ENDARTERECTOMY  with patch angioplasty;  Surgeon: Larina Earthly, MD;  Location: [facility] OR;  Service: Vascular;  Laterality: Left;  EYE SURGERY    bilateral cataract  KELOID EXCISION  04/2008  on chest scar; Dr. Stephens November  KELOID EXCISION    PILONIDAL CYST EXCISION  1989 Royce Macadamia 03/03/2024, 2:44 PM    PERTINENT LAB RESULTS: CBC: Recent Labs    03/02/24 0551  WBC 6.5  HGB 12.8*  HCT 37.8*  PLT 219   CMET CMP     Component Value Date/Time   NA 139 03/04/2024 0517   NA 143 10/28/2023 1215   K 4.0 03/04/2024 0517   CL 107 03/04/2024 0517   CO2 25 03/04/2024 0517    GLUCOSE 118 (H) 03/04/2024 0517   BUN 11 03/04/2024 0517   BUN 11 10/28/2023 1215   CREATININE 0.77 03/04/2024 0517   CREATININE 0.99 02/07/2024 1017   CALCIUM 8.0 (L) 03/04/2024 0517   PROT 6.1 (L) 02/29/2024 0338   PROT 5.9 (L) 10/28/2023 1215   ALBUMIN 2.5 (L) 02/29/2024 0338   ALBUMIN 3.6 (L) 10/28/2023 1215   AST 47 (H) 02/29/2024 0338   AST 16 02/07/2024 1017   ALT 40 02/29/2024 0338   ALT 12 02/07/2024 1017   ALKPHOS 271 (H) 02/29/2024 0338   BILITOT 1.0 02/29/2024 0338   BILITOT 0.5 02/07/2024 1017   GFR 65.66 07/10/2022 1332   EGFR 88 10/28/2023 1215   GFRNONAA >60 03/04/2024 0517   GFRNONAA >60 02/07/2024 1017    GFR Estimated Creatinine Clearance: 88 mL/min (by C-G formula based on SCr of 0.77 mg/dL). No results for input(s): "LIPASE", "AMYLASE" in the last 72 hours. No results for input(s): "CKTOTAL", "CKMB", "CKMBINDEX", "TROPONINI" in the last 72 hours. Invalid input(s): "POCBNP" No results for input(s): "DDIMER" in the last 72 hours. Recent Labs    03/01/24 1336  HGBA1C 6.6*   No results for  input(s): "CHOL", "HDL", "LDLCALC", "TRIG", "CHOLHDL", "LDLDIRECT" in the last 72 hours. No results for input(s): "TSH", "T4TOTAL", "T3FREE", "THYROIDAB" in the last 72 hours.  Invalid input(s): "FREET3" No results for input(s): "VITAMINB12", "FOLATE", "FERRITIN", "TIBC", "IRON", "RETICCTPCT" in the last 72 hours. Coags: No results for input(s): "INR" in the last 72 hours.  Invalid input(s): "PT" Microbiology: Recent Results (from the past 240 hours)  Resp panel by RT-PCR (RSV, Flu A&B, Covid) Anterior Nasal Swab     Status: None   Collection Time: 02/29/24 12:14 AM   Specimen: Anterior Nasal Swab  Result Value Ref Range Status   SARS Coronavirus 2 by RT PCR NEGATIVE NEGATIVE Final   Influenza A by PCR NEGATIVE NEGATIVE Final   Influenza B by PCR NEGATIVE NEGATIVE Final    Comment: (NOTE) The Xpert Xpress SARS-CoV-2/FLU/RSV plus assay is intended as an aid in  the diagnosis of influenza from Nasopharyngeal swab specimens and should not be used as a sole basis for treatment. Nasal washings and aspirates are unacceptable for Xpert Xpress SARS-CoV-2/FLU/RSV testing.  Fact Sheet for Patients: BloggerCourse.com  Fact Sheet for Healthcare Providers: SeriousBroker.it  This test is not yet approved or cleared by the [place] FDA and has been authorized for detection and/or diagnosis of SARS-CoV-2 by FDA under an Emergency Use Authorization (EUA). This EUA will remain in effect (meaning this test can be used) for the duration of the COVID-19 declaration under Section 564(b)(1) of the Act, 21 U.S.C. section 360bbb-3(b)(1), unless the authorization is terminated or revoked.     Resp Syncytial Virus by PCR NEGATIVE NEGATIVE Final    Comment: (NOTE) Fact Sheet for Patients: BloggerCourse.com  Fact Sheet for Healthcare Providers: SeriousBroker.it  This test is not yet approved or cleared by the [place] FDA and has been authorized for detection and/or diagnosis of SARS-CoV-2 by FDA under an Emergency Use Authorization (EUA). This EUA will remain in effect (meaning this test can be used) for the duration of the COVID-19 declaration under Section 564(b)(1) of the Act, 21 U.S.C. section 360bbb-3(b)(1), unless the authorization is terminated or revoked.  Performed at [facility], [address] [place], [place] [address]   Blood Culture (routine x 2)     Status: None (Preliminary result)   Collection Time: 02/29/24 12:14 AM   Specimen: BLOOD RIGHT HAND  Result Value Ref Range Status   Specimen Description BLOOD RIGHT HAND  Final   Special Requests   Final    BOTTLES DRAWN AEROBIC AND ANAEROBIC Blood Culture adequate volume   Culture   Final    NO GROWTH 4 DAYS Performed at [facility], [address] [place], [place]  [address]    Report Status PENDING  Incomplete  Blood Culture (routine x 2)     Status: None (Preliminary result)   Collection Time: 02/29/24 12:14 AM   Specimen: BLOOD  Result Value Ref Range Status   Specimen Description BLOOD RIGHT ANTECUBITAL  Final   Special Requests   Final    BOTTLES DRAWN AEROBIC AND ANAEROBIC Blood Culture results may not be optimal due to an inadequate volume of blood received in culture bottles   Culture   Final    NO GROWTH 4 DAYS Performed at [facility], [address], [place], [place] [address]    Report Status PENDING  Incomplete  Urine Culture     Status: Abnormal   Collection Time: 02/29/24  8:59 AM   Specimen: Urine, Random  Result Value Ref Range Status   Specimen Description URINE, RANDOM  Final   Special Requests   Final    NONE Reflexed from [id number] Performed at [facility], [address] [place], [place] [address]    Culture MULTIPLE SPECIES PRESENT, SUGGEST RECOLLECTION (A)  Final   Report Status 03/01/2024 FINAL  Final  Gastrointestinal Panel by PCR , Stool     Status: Abnormal   Collection Time: 02/29/24  1:14 PM   Specimen: Stool  Result Value Ref Range Status   Campylobacter species NOT DETECTED NOT DETECTED Final   Plesimonas shigelloides NOT DETECTED NOT DETECTED Final   Salmonella species NOT DETECTED NOT DETECTED Final   Yersinia enterocolitica NOT DETECTED NOT DETECTED Final   Vibrio species NOT DETECTED NOT DETECTED Final   Vibrio cholerae NOT DETECTED NOT DETECTED Final   Enteroaggregative E coli (EAEC) NOT DETECTED NOT DETECTED Final   Enteropathogenic E coli (EPEC) NOT DETECTED NOT DETECTED Final   Enterotoxigenic E coli (ETEC) NOT DETECTED NOT DETECTED Final   Shiga like toxin producing E coli (STEC) NOT DETECTED NOT DETECTED Final   Shigella/Enteroinvasive E coli (EIEC) NOT DETECTED NOT DETECTED Final   Cryptosporidium NOT DETECTED NOT DETECTED Final   Cyclospora cayetanensis NOT DETECTED NOT DETECTED Final    Entamoeba histolytica NOT DETECTED NOT DETECTED Final   Giardia lamblia NOT DETECTED NOT DETECTED Final   Adenovirus F40/41 NOT DETECTED NOT DETECTED Final   Astrovirus NOT DETECTED NOT DETECTED Final   Norovirus GI/GII DETECTED (A) NOT DETECTED Final    Comment: RESULT CALLED TO, READ BACK BY AND VERIFIED WITH: DELICIA MANUEL AT 1509 03/01/24.PMF    Rotavirus A NOT DETECTED NOT DETECTED Final   Sapovirus (I, II, IV, and V) NOT DETECTED NOT DETECTED Final    Comment: Performed at [facility], [address] [place], [place] [address]  C Difficile Quick Screen w PCR reflex     Status: None   Collection Time: 02/29/24  1:14 PM   Specimen: Stool  Result Value Ref Range Status   C Diff antigen NEGATIVE NEGATIVE Final   C Diff toxin NEGATIVE NEGATIVE Final   C Diff interpretation No C. difficile detected.  Final    Comment: Performed at [facility], [address] [place], [place] [address]    FURTHER DISCHARGE INSTRUCTIONS:  Get Medicines reviewed and adjusted: Please take all your medications with you for your next visit with your Primary MD  Laboratory/radiological data: Please request your Primary MD to go over all hospital tests and procedure/radiological results at the follow up, please ask your Primary MD to get all Hospital records sent to his/her office.  In some cases, they will be blood work, cultures and biopsy results pending at the time of your discharge. Please request that your primary care M.D. goes through all the records of your hospital data and follows up on these results.  Also Note the following: If you experience worsening of your admission symptoms, develop shortness of breath, life threatening emergency, suicidal or homicidal thoughts you must seek medical attention immediately by calling 911 or calling your MD immediately  if symptoms less severe.  You must read complete instructions/literature along with all the possible adverse  reactions/side effects for all the Medicines you take and that have been prescribed to you. Take any new Medicines after you have completely understood and accpet all the possible adverse reactions/side effects.   Do not drive when taking Pain medications or  sleeping medications (Benzodaizepines)  Do not take more than prescribed Pain, Sleep and Anxiety Medications. It is not advisable to combine anxiety,sleep and pain medications without talking with your primary care practitioner  Special Instructions: If you have smoked or chewed Tobacco  in the last 2 yrs please stop smoking, stop any regular Alcohol  and or any Recreational drug use.  Wear Seat belts while driving.  Please note: You were cared for by a hospitalist during your hospital stay. Once you are discharged, your primary care physician will handle any further medical issues. Please note that NO REFILLS for any discharge medications will be authorized once you are discharged, as it is imperative that you return to your primary care physician (or establish a relationship with a primary care physician if you do not have one) for your post hospital discharge needs so that they can reassess your need for medications and monitor your lab values.  Total Time spent coordinating discharge including counseling, education and face to face time equals greater than 30 minutes.  SignedDorcas Carrow 03/04/2024 1:19 PM

## 2024-03-04 NOTE — TOC Transition Note (Signed)
 Transition of Care [facility]) - Discharge Note   Patient Details  Name: [name] [name] MRN: [id number] Date of Birth: 1941/12/05  [facility] of [facility]) CM/SW Contact:  Mearl Latin, LCSW Phone Number: 03/04/2024, 3:54 PM   Clinical Narrative:    Patient will DC to: [facility] Anticipated DC date: 03/04/24 Family notified: Daughter Transport by: Sharin Mons   Per MD patient ready for DC to [facility]. RN to call report prior to discharge [phone] room [phone]). RN, patient, patient's family, and facility notified of DC. Discharge Summary and FL2 sent to facility. DC packet on chart. Ambulance transport requested for patient.   CSW will sign off for now as social work intervention is no longer needed. Please consult [place] again if new needs arise.     Final next level of care: Skilled Nursing Facility Barriers to Discharge: Barriers Resolved   Patient Goals and CMS Choice Patient states their goals for this hospitalization and ongoing recovery are:: Rehab CMS Medicare.gov Compare Post Acute Care list provided to:: Patient Represenative (must comment) Choice offered to / list presented to : Adult Children [place] ownership interest in [facility].provided to:: Adult Children    Discharge Placement   Existing PASRR number confirmed : 03/04/24          Patient chooses bed at: [facility] Patient to be transferred to facility by: [facility] Name of family member notified: Daughter Patient and family notified of of transfer: 03/04/24  Discharge Plan and Services Additional resources added to the After Visit Summary for   In-house Referral: Clinical Social Work, Hospice / Palliative Care   Post Acute Care Choice: Skilled Nursing Facility                               Social Drivers of Health (SDOH) Interventions SDOH Screenings   Food Insecurity: Patient Unable To Answer (02/29/2024)  Housing: Patient Unable To Answer (02/29/2024)   Transportation Needs: No Transportation Needs (02/25/2024)  Utilities: Not At Risk (02/25/2024)  Alcohol Screen: Low Risk  (06/24/2023)  Depression (PHQ2-9): Medium Risk (02/28/2024)  Financial Resource Strain: Low Risk  (06/24/2023)  Physical Activity: Inactive (06/24/2023)  Social Connections: Moderately Integrated (06/24/2023)  Stress: No Stress Concern Present (06/24/2023)  Tobacco Use: Medium Risk (02/29/2024)     Readmission Risk Interventions     No data to display

## 2024-03-04 NOTE — Progress Notes (Signed)
 Speech Language Pathology Treatment: Dysphagia  Patient Details Name: [name] [name] MRN: [id number] DOB: [birth year] Today's Date: 03/04/2024 Time: 9811-9147 SLP Time Calculation (min) (ACUTE ONLY): 17 min  Assessment / Plan / Recommendation Clinical Impression  Pt reclined with tray in front of him when SLP arrived. He stated he called to be sat up, no one came and proceeded to eat reclined. SLP educated on importance of upright positioning while eating/drinking. SLP showed him how to raise the bed and he was able to demonstrate. Encouraged him to sit himself up or if difficulty wait for RN to assist. MBS yesterday where pt was safe thin thin liquids while performing a left head turn and chin tuck however when wife called she preferred him to remain on nectar thick due to inability to remember and perform strategy in the past. SLP reiterated small sips as he as pirated with nectar (sensate) with larger sip during MBS. Pt observed with nectar thick from straw in which he demonstrated taking small sips. Masticated eggs and sausage timely with min-mild lingual residue and pt requested liquid that assisted in oral cavity clearance. Needed cue to take smaller bite of egg. Pt states his wife cuts his meats up at home therefore will continue Dys 3 in addition to tendency to take larger bites intermittently. Continue nectar thick liquids, pills whole in puree and lingual sweep/liquid wash to clear oral cavity. ST will plan on seeing once more while hospitalized to reiterate strategies.    HPI HPI: 83 yo male adm to [facility] with AMS. 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  Swallow eval ordered.   Most recent swallow evaluation in [facility] was 2023 with recommendatino for chin tuck and head turn left with single sips.  Wife reported at that time that the pt was not able to follow those directions.  MRI showed many remote lacunar bilteral basal ganglia, thalami, pons CVAs.  CXR negative  for acute finding.  Pt reports he continues to use thickener at home - and it sounds as if it's nectar thick.  He is on precautions for potential Cdif. MBS today for potential upgrade in liquid consistency.      SLP Plan  Continue with current plan of care      Recommendations for follow up therapy are one component of a multi-disciplinary discharge planning process, led by the attending physician.  Recommendations may be updated based on patient status, additional functional criteria and insurance authorization.    Recommendations  Diet recommendations: Dysphagia 3 (mechanical soft);Nectar-thick liquid Liquids provided via: Straw;Cup Medication Administration: Whole meds with puree Supervision: Patient able to self feed;Intermittent supervision to cue for compensatory strategies Compensations: Slow rate;Small sips/bites;Lingual sweep for clearance of pocketing Postural Changes and/or Swallow Maneuvers: Seated upright 90 degrees                  Oral care BID   Intermittent Supervision/Assistance Dysphagia, unspecified (R13.10)     Continue with current plan of care     Royce Macadamia  03/04/2024, 10:08 AM

## 2024-03-04 NOTE — TOC Progression Note (Signed)
 Transition of Care [facility]) - Progression Note    Patient Details  Name: [name] [name] MRN: [id number] Date of Birth: 08-07-1941  Transition [facility]) CM/SW Contact  Jessie Foot, RN Phone Number: 03/04/2024, 2:05 PM  Clinical Narrative:    Patient inform that he will discharged today to [facility] via [facility].   Expected Discharge Plan: Skilled Nursing Facility Barriers to Discharge: Continued Medical Work up, English as a second language teacher  Expected Discharge Plan and Services In-house Referral: Clinical Social Work, Hospice / Palliative Care   Post Acute Care Choice: Skilled Nursing Facility Living arrangements for the past 2 months: Single Family Home Expected Discharge Date: 03/04/24                                     Social Determinants of Health (SDOH) Interventions SDOH Screenings   Food Insecurity: Patient Unable To Answer (02/29/2024)  Housing: Patient Unable To Answer (02/29/2024)  Transportation Needs: No Transportation Needs (02/25/2024)  Utilities: Not At Risk (02/25/2024)  Alcohol Screen: Low Risk  (06/24/2023)  Depression (PHQ2-9): Medium Risk (02/28/2024)  Financial Resource Strain: Low Risk  (06/24/2023)  Physical Activity: Inactive (06/24/2023)  Social Connections: Moderately Integrated (06/24/2023)  Stress: No Stress Concern Present (06/24/2023)  Tobacco Use: Medium Risk (02/29/2024)    Readmission Risk Interventions     No data to display

## 2024-03-05 LAB — CULTURE, BLOOD (ROUTINE X 2)
Culture: NO GROWTH
Culture: NO GROWTH
Special Requests: ADEQUATE

## 2024-03-06 DIAGNOSIS — I509 Heart failure, unspecified: Secondary | ICD-10-CM | POA: Diagnosis not present

## 2024-03-06 DIAGNOSIS — E118 Type 2 diabetes mellitus with unspecified complications: Secondary | ICD-10-CM | POA: Diagnosis not present

## 2024-03-06 DIAGNOSIS — A0811 Acute gastroenteropathy due to Norwalk agent: Secondary | ICD-10-CM | POA: Diagnosis not present

## 2024-03-06 DIAGNOSIS — G9341 Metabolic encephalopathy: Secondary | ICD-10-CM | POA: Diagnosis not present

## 2024-03-06 DIAGNOSIS — E7800 Pure hypercholesterolemia, unspecified: Secondary | ICD-10-CM | POA: Diagnosis not present

## 2024-03-06 DIAGNOSIS — R5381 Other malaise: Secondary | ICD-10-CM | POA: Diagnosis not present

## 2024-03-06 DIAGNOSIS — R6520 Severe sepsis without septic shock: Secondary | ICD-10-CM | POA: Diagnosis not present

## 2024-03-06 DIAGNOSIS — I2510 Atherosclerotic heart disease of native coronary artery without angina pectoris: Secondary | ICD-10-CM | POA: Diagnosis not present

## 2024-03-10 DIAGNOSIS — I2510 Atherosclerotic heart disease of native coronary artery without angina pectoris: Secondary | ICD-10-CM | POA: Diagnosis not present

## 2024-03-10 DIAGNOSIS — E119 Type 2 diabetes mellitus without complications: Secondary | ICD-10-CM | POA: Diagnosis not present

## 2024-03-10 DIAGNOSIS — N179 Acute kidney failure, unspecified: Secondary | ICD-10-CM | POA: Diagnosis not present

## 2024-03-10 DIAGNOSIS — I509 Heart failure, unspecified: Secondary | ICD-10-CM | POA: Diagnosis not present

## 2024-03-10 DIAGNOSIS — E7800 Pure hypercholesterolemia, unspecified: Secondary | ICD-10-CM | POA: Diagnosis not present

## 2024-03-10 DIAGNOSIS — G9341 Metabolic encephalopathy: Secondary | ICD-10-CM | POA: Diagnosis not present

## 2024-03-10 DIAGNOSIS — L89151 Pressure ulcer of sacral region, stage 1: Secondary | ICD-10-CM | POA: Diagnosis not present

## 2024-03-11 DIAGNOSIS — I509 Heart failure, unspecified: Secondary | ICD-10-CM | POA: Diagnosis not present

## 2024-03-11 DIAGNOSIS — R6520 Severe sepsis without septic shock: Secondary | ICD-10-CM | POA: Diagnosis not present

## 2024-03-11 DIAGNOSIS — I2510 Atherosclerotic heart disease of native coronary artery without angina pectoris: Secondary | ICD-10-CM | POA: Diagnosis not present

## 2024-03-11 DIAGNOSIS — E118 Type 2 diabetes mellitus with unspecified complications: Secondary | ICD-10-CM | POA: Diagnosis not present

## 2024-03-11 DIAGNOSIS — N179 Acute kidney failure, unspecified: Secondary | ICD-10-CM | POA: Diagnosis not present

## 2024-03-11 DIAGNOSIS — G9341 Metabolic encephalopathy: Secondary | ICD-10-CM | POA: Diagnosis not present

## 2024-03-11 DIAGNOSIS — A0811 Acute gastroenteropathy due to Norwalk agent: Secondary | ICD-10-CM | POA: Diagnosis not present

## 2024-03-11 DIAGNOSIS — E7800 Pure hypercholesterolemia, unspecified: Secondary | ICD-10-CM | POA: Diagnosis not present

## 2024-03-11 DIAGNOSIS — L89151 Pressure ulcer of sacral region, stage 1: Secondary | ICD-10-CM | POA: Diagnosis not present

## 2024-03-13 DIAGNOSIS — E118 Type 2 diabetes mellitus with unspecified complications: Secondary | ICD-10-CM | POA: Diagnosis not present

## 2024-03-13 DIAGNOSIS — I509 Heart failure, unspecified: Secondary | ICD-10-CM | POA: Diagnosis not present

## 2024-03-13 DIAGNOSIS — L89151 Pressure ulcer of sacral region, stage 1: Secondary | ICD-10-CM | POA: Diagnosis not present

## 2024-03-13 DIAGNOSIS — R6520 Severe sepsis without septic shock: Secondary | ICD-10-CM | POA: Diagnosis not present

## 2024-03-13 DIAGNOSIS — G9341 Metabolic encephalopathy: Secondary | ICD-10-CM | POA: Diagnosis not present

## 2024-03-13 DIAGNOSIS — I2510 Atherosclerotic heart disease of native coronary artery without angina pectoris: Secondary | ICD-10-CM | POA: Diagnosis not present

## 2024-03-13 DIAGNOSIS — A0811 Acute gastroenteropathy due to Norwalk agent: Secondary | ICD-10-CM | POA: Diagnosis not present

## 2024-03-13 DIAGNOSIS — E7800 Pure hypercholesterolemia, unspecified: Secondary | ICD-10-CM | POA: Diagnosis not present

## 2024-03-16 DIAGNOSIS — L89151 Pressure ulcer of sacral region, stage 1: Secondary | ICD-10-CM | POA: Diagnosis not present

## 2024-03-16 DIAGNOSIS — I509 Heart failure, unspecified: Secondary | ICD-10-CM | POA: Diagnosis not present

## 2024-03-16 DIAGNOSIS — A0811 Acute gastroenteropathy due to Norwalk agent: Secondary | ICD-10-CM | POA: Diagnosis not present

## 2024-03-16 DIAGNOSIS — E118 Type 2 diabetes mellitus with unspecified complications: Secondary | ICD-10-CM | POA: Diagnosis not present

## 2024-03-16 DIAGNOSIS — G9341 Metabolic encephalopathy: Secondary | ICD-10-CM | POA: Diagnosis not present

## 2024-03-16 DIAGNOSIS — E7800 Pure hypercholesterolemia, unspecified: Secondary | ICD-10-CM | POA: Diagnosis not present

## 2024-03-16 DIAGNOSIS — I2510 Atherosclerotic heart disease of native coronary artery without angina pectoris: Secondary | ICD-10-CM | POA: Diagnosis not present

## 2024-03-18 DIAGNOSIS — E118 Type 2 diabetes mellitus with unspecified complications: Secondary | ICD-10-CM | POA: Diagnosis not present

## 2024-03-18 DIAGNOSIS — I2510 Atherosclerotic heart disease of native coronary artery without angina pectoris: Secondary | ICD-10-CM | POA: Diagnosis not present

## 2024-03-18 DIAGNOSIS — A0811 Acute gastroenteropathy due to Norwalk agent: Secondary | ICD-10-CM | POA: Diagnosis not present

## 2024-03-18 DIAGNOSIS — E7800 Pure hypercholesterolemia, unspecified: Secondary | ICD-10-CM | POA: Diagnosis not present

## 2024-03-18 DIAGNOSIS — L89151 Pressure ulcer of sacral region, stage 1: Secondary | ICD-10-CM | POA: Diagnosis not present

## 2024-03-18 DIAGNOSIS — G9341 Metabolic encephalopathy: Secondary | ICD-10-CM | POA: Diagnosis not present

## 2024-03-18 DIAGNOSIS — I509 Heart failure, unspecified: Secondary | ICD-10-CM | POA: Diagnosis not present

## 2024-03-20 DIAGNOSIS — I119 Hypertensive heart disease without heart failure: Secondary | ICD-10-CM | POA: Diagnosis not present

## 2024-03-20 DIAGNOSIS — E7800 Pure hypercholesterolemia, unspecified: Secondary | ICD-10-CM | POA: Diagnosis not present

## 2024-03-20 DIAGNOSIS — I2510 Atherosclerotic heart disease of native coronary artery without angina pectoris: Secondary | ICD-10-CM | POA: Diagnosis not present

## 2024-03-20 DIAGNOSIS — E118 Type 2 diabetes mellitus with unspecified complications: Secondary | ICD-10-CM | POA: Diagnosis not present

## 2024-03-20 DIAGNOSIS — A0811 Acute gastroenteropathy due to Norwalk agent: Secondary | ICD-10-CM | POA: Diagnosis not present

## 2024-03-20 DIAGNOSIS — G9341 Metabolic encephalopathy: Secondary | ICD-10-CM | POA: Diagnosis not present

## 2024-03-20 DIAGNOSIS — I509 Heart failure, unspecified: Secondary | ICD-10-CM | POA: Diagnosis not present

## 2024-03-20 DIAGNOSIS — R600 Localized edema: Secondary | ICD-10-CM | POA: Diagnosis not present

## 2024-03-20 DIAGNOSIS — L89151 Pressure ulcer of sacral region, stage 1: Secondary | ICD-10-CM | POA: Diagnosis not present

## 2024-03-24 DIAGNOSIS — Z743 Need for continuous supervision: Secondary | ICD-10-CM | POA: Diagnosis not present

## 2024-03-24 DIAGNOSIS — I10 Essential (primary) hypertension: Secondary | ICD-10-CM | POA: Diagnosis not present

## 2024-03-25 ENCOUNTER — Telehealth: Payer: Self-pay | Admitting: Primary Care

## 2024-03-25 ENCOUNTER — Telehealth: Payer: Self-pay

## 2024-03-25 NOTE — Transitions of Care (Post Inpatient/ED Visit) (Signed)
   03/25/2024  Name: [name] [name] MRN: [id number] DOB: 10/14/1941  Today's TOC FU Call Status: Today's TOC FU Call Status:: Successful TOC FU Call Completed TOC FU Call Complete Date: 03/25/24 Patient's Name and Date of Birth confirmed.  Transition Care Management Follow-up Telephone Call Date of Discharge: 03/24/24 Discharge Facility: Other ([facility]) Name of Other ([facility]) Discharge Facility: [facility] Type of Discharge: Inpatient Admission Primary Inpatient Discharge Diagnosis:: gastroenteropathy How have you been since you were released from the hospital?: Better Any questions or concerns?: No  Items Reviewed: Did you receive and understand the discharge instructions provided?: Yes Medications obtained,verified, and reconciled?: Yes (Medications Reviewed) Any new allergies since your discharge?: No Dietary orders reviewed?: Yes Do you have support at home?: Yes People in Home: spouse  Medications Reviewed Today: Medications Reviewed Today     Reviewed by Karena Addison, LPN (Licensed Practical Nurse) on 03/25/24 at [phone]  Med List Status: <None>   Medication Order Taking? Sig Documenting Provider Last Dose Status Informant  atorvastatin (LIPITOR) 10 MG tablet [id number] Yes TAKE 1 TABLET BY MOUTH EVERY DAY FOR CHOLESTEROL Doreene Nest, NP Taking Active Family Member  cholecalciferol (VITAMIN D3) 25 MCG (1000 UNIT) tablet [id number] Yes Take 1,000 Units by mouth daily. [provider] Taking Active Family Member  clopidogrel (PLAVIX) 75 MG tablet [id number] Yes TAKE 1 TABLET BY MOUTH EVERY DAY Doreene Nest, NP Taking Active Family Member  cyanocobalamin (VITAMIN B12) 500 MCG tablet [id number] Yes Take 500 mcg by mouth daily. [provider] Taking Active Family Member  furosemide (LASIX) 20 MG tablet [id number] Yes Take 20 mg by mouth daily as needed for edema. [provider] Taking Active Family Member  glucose blood  (ACCU-CHEK GUIDE) test strip [id number] Yes Use as instructed Doreene Nest, NP Taking Active Family Member  losartan (COZAAR) 25 MG tablet [id number] Yes Take 25 mg by mouth daily. [provider] Taking Active   tamsulosin (FLOMAX) 0.4 MG CAPS capsule [id number] Yes Take 1 capsule (0.4 mg total) by mouth daily. Maretta Bees, MD Taking Active             Home Care and Equipment/Supplies: Were Home Health Services Ordered?: Yes Name of Home Health Agency:: [name] Has Agency set up a time to come to your home?: No Any new equipment or medical supplies ordered?: NA  Functional Questionnaire: Do you need assistance with bathing/showering or dressing?: Yes Do you need assistance with meal preparation?: Yes Do you need assistance with eating?: No Do you have difficulty maintaining continence: Yes Do you need assistance with getting out of bed/getting out of a chair/moving?: Yes Do you have difficulty managing or taking your medications?: Yes  Follow up appointments reviewed: PCP Follow-up appointment confirmed?: No (Hospice) MD Provider Line Number:[phone] Given: No Specialist Hospital Follow-up appointment confirmed?: NA Do you need transportation to your follow-up appointment?: No Do you understand care options if your condition(s) worsen?: Yes-patient verbalized understanding    SIGNATURE Karena Addison, LPN [facility] Nurse Health Advisor Direct Dial [phone]

## 2024-03-25 NOTE — Telephone Encounter (Signed)
 Copied from CRM [id number]. Topic: General - Other >> Mar 25, 2024 11:21 AM Pascal Lux wrote: Reason for CRM: Star calling from [facility]  - Patient was discharged recently and now is requesting to be back on hospice services. He is now requesting his provider Vernona Rieger to be his provider while in Hospice care. They just need a yes or no from the provider.  Fax: [phone] - Phone: [phone]

## 2024-03-26 NOTE — Telephone Encounter (Signed)
 Unable to reach patient. Left voicemail to return call to our office.

## 2024-03-26 NOTE — Telephone Encounter (Signed)
 Please call patient:  Let them know that it's best for the hospice doctor to assume care while he's on hospice as they can get to his needs much faster than I can. If he's okay with that then call [facility] and let them know that hospice provider should assume care.

## 2024-03-27 DIAGNOSIS — I639 Cerebral infarction, unspecified: Secondary | ICD-10-CM | POA: Diagnosis not present

## 2024-03-27 DIAGNOSIS — R2681 Unsteadiness on feet: Secondary | ICD-10-CM | POA: Diagnosis not present

## 2024-03-27 DIAGNOSIS — R296 Repeated falls: Secondary | ICD-10-CM | POA: Diagnosis not present

## 2024-03-27 NOTE — Telephone Encounter (Signed)
 Called and spoke with patients wife, she is agreeable to [facility] hospice provider being the attending. Patients wife wanted to let Jae Dire know that there may be certain things she wants Isabella Stalling opinion on even though the hospice provider is assuming his care.  Called and advised [facility].

## 2024-03-27 NOTE — Telephone Encounter (Signed)
 That is perfectly reasonable.

## 2024-03-27 NOTE — Telephone Encounter (Signed)
 Unable to reach patient. Left voicemail to return call to our office.

## 2024-03-30 ENCOUNTER — Telehealth: Payer: Self-pay

## 2024-03-30 DIAGNOSIS — I10 Essential (primary) hypertension: Secondary | ICD-10-CM

## 2024-03-30 NOTE — Progress Notes (Signed)
 Complex Care Management Note Care Guide Note  03/30/2024 Name: [name] [name] MRN: [id number] DOB: [birth year]   Complex Care Management Outreach Attempts: An unsuccessful telephone outreach was attempted today to offer the patient information about available complex care management services.  Follow Up Plan:  Additional outreach attempts will be made to offer the patient complex care management information and services.   Encounter Outcome:  No Answer  Penne Lash , RMA     [place]  [facility] [employer], [facility] Guide  Direct Dial: [phone]  Website: [email or web address]

## 2024-04-02 NOTE — Progress Notes (Signed)
 Complex Care Management Note Care Guide Note  04/02/2024 Name: [name] [name] MRN: [id number] DOB: November 14, 1941   Complex Care Management Outreach Attempts: A second unsuccessful outreach was attempted today to offer the patient with information about available complex care management services.  Follow Up Plan:  Additional outreach attempts will be made to offer the patient complex care management information and services.   Encounter Outcome:  No Answer  Penne Lash , RMA     [place]  [facility], [facility] Guide  Direct Dial: [phone]  Website: [email or web address]

## 2024-04-06 NOTE — Progress Notes (Signed)
 Complex Care Management Note  Care Guide Note 04/06/2024 Name: [name] [name] MRN: [id number] DOB: 09-13-1941  [name] [name] is a 83 y.o. year old male who sees Doreene Nest, NP for primary care. I reached out to Alexia Freestone by phone today to offer complex care management services.  [name] was given information about Complex Care Management services today including:   The Complex Care Management services include support from the care team which includes your Nurse Care Manager, Clinical Social Worker, or Pharmacist.  The Complex Care Management team is here to help remove barriers to the health concerns and goals most important to you. Complex Care Management services are voluntary, and the patient may decline or stop services at any time by request to their care team member.   Complex Care Management Consent Status: Patient did not agree to participate in complex care management services at this time.   Encounter Outcome:  Patient Refused  Penne Lash , RMA     [facility]  [facility], [facility] Guide  Direct Dial: [phone]  Website: [email or web address]

## 2024-04-27 DIAGNOSIS — R2681 Unsteadiness on feet: Secondary | ICD-10-CM | POA: Diagnosis not present

## 2024-04-27 DIAGNOSIS — R296 Repeated falls: Secondary | ICD-10-CM | POA: Diagnosis not present

## 2024-04-27 DIAGNOSIS — I639 Cerebral infarction, unspecified: Secondary | ICD-10-CM | POA: Diagnosis not present

## 2024-05-05 ENCOUNTER — Encounter: Payer: Self-pay | Admitting: Hematology and Oncology

## 2024-05-06 ENCOUNTER — Encounter: Payer: Self-pay | Admitting: Hematology and Oncology

## 2024-05-06 ENCOUNTER — Inpatient Hospital Stay: Payer: Medicare PPO

## 2024-05-06 ENCOUNTER — Telehealth: Payer: Self-pay | Admitting: Hematology and Oncology

## 2024-05-06 NOTE — Telephone Encounter (Signed)
 [name] rescheduled Deigo's lab appointment.

## 2024-05-11 ENCOUNTER — Telehealth: Payer: Self-pay | Admitting: Hematology and Oncology

## 2024-05-12 ENCOUNTER — Inpatient Hospital Stay

## 2024-05-23 ENCOUNTER — Other Ambulatory Visit: Payer: Self-pay | Admitting: Primary Care

## 2024-05-23 DIAGNOSIS — E7849 Other hyperlipidemia: Secondary | ICD-10-CM

## 2024-06-10 ENCOUNTER — Encounter: Payer: Self-pay | Admitting: Hematology and Oncology

## 2024-06-17 ENCOUNTER — Inpatient Hospital Stay: Attending: Hematology and Oncology

## 2024-06-25 ENCOUNTER — Encounter: Payer: Self-pay | Admitting: Hematology and Oncology

## 2024-07-05 ENCOUNTER — Other Ambulatory Visit: Payer: Self-pay

## 2024-07-05 ENCOUNTER — Emergency Department (HOSPITAL_COMMUNITY)
Admission: EM | Admit: 2024-07-05 | Discharge: 2024-07-05 | Disposition: A | Discharge status: Home / Self Care | Source: Intra-hospital | Attending: Emergency Medicine | Admitting: Emergency Medicine

## 2024-07-05 ENCOUNTER — Emergency Department (HOSPITAL_COMMUNITY)

## 2024-07-05 ENCOUNTER — Encounter (HOSPITAL_COMMUNITY): Payer: Self-pay | Admitting: Emergency Medicine

## 2024-07-05 DIAGNOSIS — Z79899 Other long term (current) drug therapy: Secondary | ICD-10-CM | POA: Insufficient documentation

## 2024-07-05 DIAGNOSIS — E119 Type 2 diabetes mellitus without complications: Secondary | ICD-10-CM | POA: Diagnosis not present

## 2024-07-05 DIAGNOSIS — Z8673 Personal history of transient ischemic attack (TIA), and cerebral infarction without residual deficits: Secondary | ICD-10-CM | POA: Insufficient documentation

## 2024-07-05 DIAGNOSIS — F0390 Unspecified dementia without behavioral disturbance: Secondary | ICD-10-CM | POA: Insufficient documentation

## 2024-07-05 DIAGNOSIS — R410 Disorientation, unspecified: Secondary | ICD-10-CM | POA: Diagnosis not present

## 2024-07-05 DIAGNOSIS — I10 Essential (primary) hypertension: Secondary | ICD-10-CM | POA: Insufficient documentation

## 2024-07-05 DIAGNOSIS — R0989 Other specified symptoms and signs involving the circulatory and respiratory systems: Secondary | ICD-10-CM | POA: Diagnosis not present

## 2024-07-05 DIAGNOSIS — R9082 White matter disease, unspecified: Secondary | ICD-10-CM | POA: Diagnosis not present

## 2024-07-05 DIAGNOSIS — J9811 Atelectasis: Secondary | ICD-10-CM | POA: Diagnosis not present

## 2024-07-05 DIAGNOSIS — Z7902 Long term (current) use of antithrombotics/antiplatelets: Secondary | ICD-10-CM | POA: Diagnosis not present

## 2024-07-05 DIAGNOSIS — I6381 Other cerebral infarction due to occlusion or stenosis of small artery: Secondary | ICD-10-CM | POA: Diagnosis not present

## 2024-07-05 DIAGNOSIS — R4182 Altered mental status, unspecified: Secondary | ICD-10-CM | POA: Diagnosis not present

## 2024-07-05 LAB — URINALYSIS, ROUTINE W REFLEX MICROSCOPIC
Bilirubin Urine: NEGATIVE
Glucose, UA: NEGATIVE mg/dL
Hgb urine dipstick: NEGATIVE
Ketones, ur: NEGATIVE mg/dL
Leukocytes,Ua: NEGATIVE
Nitrite: NEGATIVE
Protein, ur: NEGATIVE mg/dL
Specific Gravity, Urine: 1.017 (ref 1.005–1.030)
pH: 5 (ref 5.0–8.0)

## 2024-07-05 LAB — CBC WITH DIFFERENTIAL/PLATELET
Abs Immature Granulocytes: 0.09 K/uL — ABNORMAL HIGH (ref 0.00–0.07)
Basophils Absolute: 0 K/uL (ref 0.0–0.1)
Basophils Relative: 0 %
Eosinophils Absolute: 0.2 K/uL (ref 0.0–0.5)
Eosinophils Relative: 2 %
HCT: 45 % (ref 39.0–52.0)
Hemoglobin: 15.8 g/dL (ref 13.0–17.0)
Immature Granulocytes: 1 %
Lymphocytes Relative: 17 %
Lymphs Abs: 1.4 K/uL (ref 0.7–4.0)
MCH: 35.2 pg — ABNORMAL HIGH (ref 26.0–34.0)
MCHC: 35.1 g/dL (ref 30.0–36.0)
MCV: 100.2 fL — ABNORMAL HIGH (ref 80.0–100.0)
Monocytes Absolute: 0.8 K/uL (ref 0.1–1.0)
Monocytes Relative: 10 %
Neutro Abs: 5.9 K/uL (ref 1.7–7.7)
Neutrophils Relative %: 70 %
Platelets: 224 K/uL (ref 150–400)
RBC: 4.49 MIL/uL (ref 4.22–5.81)
RDW: 13.6 % (ref 11.5–15.5)
WBC: 8.5 K/uL (ref 4.0–10.5)
nRBC: 0 % (ref 0.0–0.2)

## 2024-07-05 LAB — COMPREHENSIVE METABOLIC PANEL WITH GFR
ALT: 19 U/L (ref 0–44)
AST: 27 U/L (ref 15–41)
Albumin: 2.9 g/dL — ABNORMAL LOW (ref 3.5–5.0)
Alkaline Phosphatase: 108 U/L (ref 38–126)
Anion gap: 8 (ref 5–15)
BUN: 16 mg/dL (ref 8–23)
CO2: 24 mmol/L (ref 22–32)
Calcium: 8.7 mg/dL — ABNORMAL LOW (ref 8.9–10.3)
Chloride: 105 mmol/L (ref 98–111)
Creatinine, Ser: 0.97 mg/dL (ref 0.61–1.24)
GFR, Estimated: >60 mL/min (ref >60)
Glucose, Bld: 145 mg/dL — ABNORMAL HIGH (ref 70–99)
Potassium: 3.7 mmol/L (ref 3.5–5.1)
Sodium: 137 mmol/L (ref 135–145)
Total Bilirubin: 0.9 mg/dL (ref 0.0–1.2)
Total Protein: 6.5 g/dL (ref 6.5–8.1)

## 2024-07-05 LAB — AMMONIA: Ammonia: 27 umol/L (ref 9–35)

## 2024-07-05 LAB — CBG MONITORING, ED: Glucose-Capillary: 145 mg/dL — ABNORMAL HIGH (ref 70–99)

## 2024-07-05 LAB — ETHANOL: Alcohol, Ethyl (B): <15 mg/dL (ref <15)

## 2024-07-05 MED ORDER — CEFUROXIME AXETIL 250 MG PO TABS: 250.0000 mg | ORAL_TABLET | Freq: Two times a day (BID) | ORAL | 0 refills | Status: AC

## 2024-07-05 MED ORDER — SODIUM CHLORIDE 0.9 % IV BOLUS
500.0000 mL | Freq: Once | INTRAVENOUS | Status: AC
  Administered 2024-07-05: 500 mL via INTRAVENOUS

## 2024-07-05 NOTE — ED Triage Notes (Signed)
 Per [facility] patient coming from home- family called out for increased confusion. Home health nurse reports intermittent confusion due to dementia however family states he is alert and orientated at baseline. Patient on cipro x 4 days for UTI. Family reports decreased oral intake. [facility] states patient is hospice pt and a full code.

## 2024-07-05 NOTE — ED Notes (Signed)
 CCMD called and notified

## 2024-07-05 NOTE — ED Provider Notes (Signed)
 [place] EMERGENCY DEPARTMENT AT [facility] Provider Note   CSN: [id number] Arrival date & time: 07/05/24  1126     Patient presents with: Altered Mental Status   [name] [name] is a 83 y.o. male.  {Add pertinent medical, surgical, social history, OB history to YEP:67052}  Altered Mental Status    Patient has a history of hypertension renal insufficiency hypercholesterolemia basal cell carcinoma, stroke, diabetes, metabolic encephalopathy.  Patient presented to the emergency room reportedly for increased confusion.  Patient does have underlying dementia however family reports patient is usually alert and oriented.  Patient recently started on Cipro 4 days for UTI.  Family reported decreased oral intake.  He was sent to the ED for further evaluation.  Patient himself denies any complaints.  He denies having any headache.  No fevers or chills.  No chest pain.  Patient is not sure why he is here in the ED  Prior to Admission medications   Medication Sig Start Date End Date Taking? Authorizing Provider  atorvastatin  (LIPITOR) 10 MG tablet TAKE 1 TABLET BY MOUTH EVERY DAY FOR CHOLESTEROL 05/24/24   Clark, Katherine K, NP  cholecalciferol (VITAMIN D3) 25 MCG (1000 UNIT) tablet Take 1,000 Units by mouth daily.    [provider]  clopidogrel  (PLAVIX ) 75 MG tablet TAKE 1 TABLET BY MOUTH EVERY DAY 07/26/23   Clark, Katherine K, NP  cyanocobalamin  (VITAMIN B12) 500 MCG tablet Take 500 mcg by mouth daily.    [provider]  furosemide  (LASIX ) 20 MG tablet Take 20 mg by mouth daily as needed for edema.    [provider]  glucose blood (ACCU-CHEK GUIDE) test strip Use as instructed 10/11/23   Clark, Katherine K, NP  losartan  (COZAAR ) 25 MG tablet Take 25 mg by mouth daily.    [provider]  tamsulosin  (FLOMAX ) 0.4 MG CAPS capsule Take 1 capsule (0.4 mg total) by mouth daily. 03/04/24   Ghimire, Donalda CHRISTELLA, MD    Allergies: Niacin and Ativan   Caesar.Bye ]    Review of Systems  Updated Vital Signs BP 126/76   Pulse 72   Temp (!) 97.4 F (36.3 C) (Axillary)   Resp 18   Ht 1.829 m (6')   Wt 94.3 kg   SpO2 99%   BMI 28.21 kg/m   Physical Exam Vitals and nursing note reviewed.  Constitutional:      Appearance: He is well-developed. He is not diaphoretic.  HENT:     Head: Normocephalic and atraumatic.     Right Ear: External ear normal.     Left Ear: External ear normal.  Eyes:     General: No scleral icterus.       Right eye: No discharge.        Left eye: No discharge.     Conjunctiva/sclera: Conjunctivae normal.  Neck:     Trachea: No tracheal deviation.  Cardiovascular:     Rate and Rhythm: Normal rate and regular rhythm.  Pulmonary:     Effort: Pulmonary effort is normal. No respiratory distress.     Breath sounds: Normal breath sounds. No stridor. No wheezing or rales.  Abdominal:     General: Bowel sounds are normal. There is no distension.     Palpations: Abdomen is soft.     Tenderness: There is no abdominal tenderness. There is no guarding or rebound.  Musculoskeletal:        General: No tenderness or deformity.     Cervical back: Neck supple.  Skin:    General: Skin is warm and dry.     Findings: No rash.  Neurological:     General: No focal deficit present.     Mental Status: He is alert.     GCS: GCS eye subscore is 4. GCS verbal subscore is 4. GCS motor subscore is 6.     Cranial Nerves: No cranial nerve deficit, dysarthria or facial asymmetry.     Sensory: No sensory deficit.     Motor: No abnormal muscle tone or seizure activity.     Coordination: Coordination normal.     Comments: Confused responses at times although patient does answer questions, he does follow commands  Psychiatric:        Mood and Affect: Mood normal.     (all labs ordered are listed, but only abnormal results are displayed) Labs Reviewed  COMPREHENSIVE METABOLIC PANEL WITH GFR - Abnormal; Notable for the  following components:      Result Value   Glucose, Bld 145 (*)    Calcium  8.7 (*)    Albumin 2.9 (*)    All other components within normal limits  CBC WITH DIFFERENTIAL/PLATELET - Abnormal; Notable for the following components:   MCV 100.2 (*)    MCH 35.2 (*)    Abs Immature Granulocytes 0.09 (*)    All other components within normal limits  CBG MONITORING, ED - Abnormal; Notable for the following components:   Glucose-Capillary 145 (*)    All other components within normal limits  URINALYSIS, ROUTINE W REFLEX MICROSCOPIC  ETHANOL  AMMONIA  CBG MONITORING, ED    EKG: EKG Interpretation Date/Time:  Sunday July 05 2024 12:07:14 EDT Ventricular Rate:  66 PR Interval:  177 QRS Duration:  101 QT Interval:  388 QTC Calculation: 407 R Axis:   74  Text Interpretation: Sinus rhythm Confirmed by Randol Simmonds [phone]) on 07/05/2024 1:38:42 PM  Radiology: CT HEAD WO CONTRAST Result Date: 07/05/2024 CLINICAL DATA:  Mental status change. EXAM: CT HEAD WITHOUT CONTRAST TECHNIQUE: Contiguous axial images were obtained from the base of the skull through the vertex without intravenous contrast. RADIATION DOSE REDUCTION: This exam was performed according to the departmental dose-optimization program which includes automated exposure control, adjustment of the mA and/or kV according to patient size and/or use of iterative reconstruction technique. COMPARISON:  None Available. FINDINGS: Brain: No acute intracranial hemorrhage. No focal mass lesion. No CT evidence of acute infarction. No midline shift or mass effect. No hydrocephalus. Basilar cisterns are patent. There are periventricular and subcortical white matter hypodensities. Deep white matter lacunar infarctions. Generalized cortical atrophy. Vascular: No hyperdense vessel or unexpected calcification. Skull: Normal. Negative for fracture or focal lesion. Sinuses/Orbits: Paranasal sinuses and mastoid air cells are clear. Orbits are clear. Other: None.  IMPRESSION: 1. No acute intracranial findings. 2. Atrophy and white matter microvascular disease. Electronically Signed   By: Jackquline Boxer M.D.   On: 07/05/2024 12:53   DG Chest 1 View Result Date: 07/05/2024 CLINICAL DATA:  Altered mental status. EXAM: CHEST  1 VIEW COMPARISON:  Radiographs 02/28/2024 and 10/13/2023. FINDINGS: 1213 hours. Persistent low lung volumes with chronic bibasilar atelectasis, greater on the right. No confluent airspace disease, pneumothorax or significant pleural effusion. 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 No acute osseous findings are evident. Telemetry leads overlie the chest. IMPRESSION: Radiographically stable appearance of the chest with low lung volumes and chronic bibasilar atelectasis. No acute findings. Electronically Signed   By: Elsie  Gertrude M.D.   On: 07/05/2024 12:32    {Document cardiac monitor, telemetry assessment procedure when appropriate:32947} Procedures   Medications Ordered in the ED  sodium chloride  0.9 % bolus 500 mL (500 mLs Intravenous New Bag/Given 07/05/24 1204)    Clinical Course as of 07/05/24 1355  Sun Jul 05, 2024  1331 CBC with Differential/Platelet(!) CBC unremarkable.  Metabolic panel without significant abnormalities.  Urinalysis negative. [JK]  1332 Comprehensive metabolic panel(!) [JK]  1332 Head CT without acute abnormalities [JK]  1332 Chest x-ray without acute abnormalities [JK]    Clinical Course User Index [JK] Randol Simmonds, MD   {Click here for ABCD2, HEART and other calculators REFRESH Note before signing:1}                              Medical Decision Making Problems Addressed: Confusion and disorientation: acute illness or injury that poses a threat to life or bodily functions  Amount and/or Complexity of Data Reviewed Labs: ordered. Decision-making details documented in ED Course. Radiology: ordered and independent interpretation performed.   Patient  presented to the ED for evaluation of increasing confusion.  Patient does have history of underlying mild dementia.  Family states usually he is more alert and less confused.  Patient was recently started on antibiotics for UTI.  He has been taking Cipro.  Patient's ED workup does not show any signs of severe dehydration.  No acute kidney injury.  No significant metabolic abnormalities.  Patient does not have a leukocytosis.  His urinalysis does not suggest persistent infection.  CT scan does not show any signs of hemorrhage or other acute abnormality.  Chest x-ray without pneumonia.  It is possible that the Cipro could be contributing to confusion associated with his underlying dementia.  Patient is currently followed by hospice.  Patient otherwise appears medically stable.  Discussed these findings with patient's daughter.  Will plan on changing his antibiotic regimen.  Otherwise appears appropriate for outpatient follow-up. {Document critical care time when appropriate  Document review of labs and clinical decision tools ie CHADS2VASC2, etc  Document your independent review of radiology images and any outside records  Document your discussion with family members, caretakers and with consultants  Document social determinants of health affecting pt's care  Document your decision making why or why not admission, treatments were needed:32947:::1}   Final diagnoses:  Confusion and disorientation    ED Discharge Orders     None

## 2024-07-05 NOTE — Discharge Instructions (Signed)
 Stop taking the Cipro antibiotic.  It could be contributing to the confusion.  Start taking the cephalosporin medication and said.  Follow-up with your doctor to be rechecked.  Return to the ED for worsening symptoms

## 2024-07-07 ENCOUNTER — Ambulatory Visit

## 2024-07-07 ENCOUNTER — Encounter

## 2024-07-07 NOTE — Progress Notes (Unsigned)
 Subjective:   [name] [name] is a 83 y.o. who presents for a Medicare Wellness preventive visit.  As a reminder, Annual Wellness Visits don't include a physical exam, and some assessments may be limited, especially if this visit is performed virtually. We may recommend an in-person follow-up visit with your provider if needed.  Visit Complete: {VISITMETHODVS:[phone]}  {AWVVIDEO:32072}  Persons Participating in Visit: {Persons Participating in Visit:32444}  AWV Questionnaire: {AWVQuestionnaire:32338}        Objective:    There were no vitals filed for this visit. There is no height or weight on file to calculate BMI.     07/05/2024   11:33 AM 03/02/2024    5:00 PM 02/28/2024   10:01 PM 06/24/2023    2:27 PM 02/04/2023   10:39 AM 09/06/2022   10:12 AM 08/21/2022    8:57 AM  Advanced Directives  Does Patient Have a Medical Advance Directive? Unable to assess, patient is non-responsive or altered mental status No No No No No No  Would patient like information on creating a medical advance directive?  Yes (Inpatient - patient requests chaplain consult to create a medical advance directive)  No - Patient declined       Current Medications (verified) Outpatient Encounter Medications as of 07/07/2024  Medication Sig   atorvastatin  (LIPITOR) 10 MG tablet TAKE 1 TABLET BY MOUTH EVERY DAY FOR CHOLESTEROL   cefUROXime  (CEFTIN ) 250 MG tablet Take 1 tablet (250 mg total) by mouth 2 (two) times daily with a meal for 5 days.   cholecalciferol (VITAMIN D3) 25 MCG (1000 UNIT) tablet Take 1,000 Units by mouth daily.   clopidogrel  (PLAVIX ) 75 MG tablet TAKE 1 TABLET BY MOUTH EVERY DAY   cyanocobalamin  (VITAMIN B12) 500 MCG tablet Take 500 mcg by mouth daily.   furosemide  (LASIX ) 20 MG tablet Take 20 mg by mouth daily as needed for edema.   glucose blood (ACCU-CHEK GUIDE) test strip Use as instructed   losartan  (COZAAR ) 25 MG tablet Take 25 mg by mouth daily.   tamsulosin  (FLOMAX ) 0.4 MG CAPS  capsule Take 1 capsule (0.4 mg total) by mouth daily.   No facility-administered encounter medications on file as of 07/07/2024.    Allergies (verified) Niacin and Ativan  [lorazepam ]   History: Past Medical History:  Diagnosis Date   Aspiration pneumonia vs. CAP 02/28/2022   Basal cell carcinoma 11/09/2019   nod & infil-behind right ear-cx3 &exc   Basal cell carcinoma 03/21/2020   Residual BCC with peripheral margin involved - ST recommends MOHs   Carotid artery occlusion    Coronary artery disease    s/p CABG 2005; sees Dr Delford yearly   Diabetes mellitus without complication (HCC)    Gynecomastia    History of COVID-19 01/08/2022   Hypercholesterolemia    Hypertension    Neurodermatitis    Overweight(278.02)    Personal history of colonic polyps 10/08/2006   tubular adenomas   Renal insufficiency    Stroke [facility])    TIA  April 2022   Transient ischemic attack 2010   lasted ~ 5 seconds   Vitamin D  deficiency    Past Surgical History:  Procedure Laterality Date   CARDIAC CATHETERIZATION  02/2004   tried to stent; couldn't   CAROTID ENDARTERECTOMY Right 08/26/2015   COLONOSCOPY     CORONARY ANGIOPLASTY     CORONARY ARTERY BYPASS GRAFT  Feb. 2005   4 vessel   ENDARTERECTOMY Right 08/26/2015   Procedure: Right Carotid ENDARTERECTOMY with Patch Angioplasty ;  Surgeon: Krystal JULIANNA Doing, MD;  Location: [facility] OR;  Service: Vascular;  Laterality: Right;   ENDARTERECTOMY Left 05/18/2021   Procedure: LEFT CAROTID ARTERY ENDARTERECTOMY  with patch angioplasty;  Surgeon: Doing Krystal JULIANNA, MD;  Location: [facility] OR;  Service: Vascular;  Laterality: Left;   EYE SURGERY     bilateral cataract   KELOID EXCISION  04/2008   on chest scar; Dr. Mercer   KELOID EXCISION     PILONIDAL CYST EXCISION  1989   Family History  Problem Relation Age of Onset   Heart disease Mother        Before age 32   Diabetes Mother    Kidney disease Mother    Heart attack Mother 81   Lung cancer Father 55    Diabetes Brother    Heart disease Brother    Heart disease Brother    Arthritis Brother    Diabetes Sister    Fibromyalgia Sister    Lung cancer Paternal Uncle        questionable as to if it was lung ca   Healthy Daughter    Colon cancer Neg Hx    Stroke Neg Hx    Social History   Socioeconomic History   Marital status: Married    Spouse name: [name]   Number of children: 1   Years of education: Not on file   Highest education level: Some college, no degree  Occupational History   Occupation: retired  Tobacco Use   Smoking status: Never   Smokeless tobacco: Former  Building services engineer status: Never Used  Substance and Sexual Activity   Alcohol use: No    Alcohol/week: 0.0 standard drinks of alcohol   Drug use: No   Sexual activity: Yes  Other Topics Concern   Not on file  Social History Narrative   Retired.   Once worked for the [employer].   Married.   Enjoys reading, spending time with family.    Left handed   Drinks caffeine   One story home   Social Drivers of Health   Financial Resource Strain: Low Risk  (06/24/2023)   Overall Financial Resource Strain (CARDIA)    Difficulty of Paying Living Expenses: Not hard at all  Food Insecurity: Patient Unable To Answer (02/29/2024)   Hunger Vital Sign    Worried About Running Out of Food in the Last Year: Patient unable to answer    Ran Out of Food in the Last Year: Patient unable to answer  Transportation Needs: No Transportation Needs (02/25/2024)   PRAPARE - Administrator, Civil Service (Medical): No    Lack of Transportation (Non-Medical): No  Physical Activity: Inactive (06/24/2023)   Exercise Vital Sign    Days of Exercise per Week: 0 days    Minutes of Exercise per Session: 0 min  Stress: No Stress Concern Present (06/24/2023)   [employer] of Occupational Health - Occupational Stress Questionnaire    Feeling of Stress : Not at all  Social Connections: Moderately Integrated (06/24/2023)   Social  Connection and Isolation Panel    Frequency of Communication with Friends and Family: More than three times a week    Frequency of Social Gatherings with Friends and Family: More than three times a week    Attends Religious Services: More than 4 times per year    Active Member of [employer] or Organizations: No    Attends Banker Meetings: Never    Marital Status: Married  Tobacco Counseling Counseling given: Not Answered    Clinical Intake:              Lab Results  Component Value Date   HGBA1C 6.6 (H) 03/01/2024   HGBA1C 7.2 (H) 05/29/2023   HGBA1C 6.4 07/10/2022               Activities of Daily Living ***    03/02/2024    4:00 PM  In your present state of health, do you have any difficulty performing the following activities:  Hearing? 1  Vision? 0  Difficulty concentrating or making decisions? 0  Doing errands, shopping? 0    Patient Care Team: Gretta Comer POUR, NP as PCP - General (Internal Medicine) Delford Maude BROCKS, MD as PCP - Cardiology (Cardiology) Delford Maude BROCKS, MD as Attending Physician (Cardiology) Skeet Juliene SAUNDERS, DO as Consulting Physician (Neurology) Ermalinda Lenn HERO, LCSW as [facility], [place], [place] *** I have updated your Care Teams any recent Medical Services you may have received from other providers in the past year.     Assessment:   This is a routine wellness examination for Avanish.  Hearing/Vision screen No results found.   Goals Addressed   None    Depression Screen ***    02/28/2024    3:30 PM 07/12/2023   10:39 AM 06/24/2023    2:24 PM 02/12/2023   12:04 PM 06/14/2022    8:58 AM 04/20/2022    4:07 PM 03/22/2022    3:42 PM  PHQ 2/9 Scores  PHQ - 2 Score 1 5 0 0 0 0 2  PHQ- 9 Score 10 21  3  0 0 6  Exception Documentation  Other- indicate reason in comment box       Not completed  Wife filled out         Fall Risk ***    02/28/2024    3:30 PM 07/12/2023   10:41 AM 06/24/2023    2:19  PM 02/12/2023   12:03 PM 02/04/2023   10:29 AM  Fall Risk   Falls in the past year? 1 1 0 0 0  Number falls in past yr: 0 1 1 0 0  Comment   fell going to bathroom    Injury with Fall? 0 0 0 0 0  Risk for fall due to : No Fall Risks History of fall(s);Other (Comment) Impaired balance/gait;Impaired mobility No Fall Risks   Risk for fall due to: Comment  in a wheelchair     Follow up Falls evaluation completed Falls evaluation completed Falls prevention discussed;Falls evaluation completed Falls evaluation completed     MEDICARE RISK AT HOME: ***    TIMED UP AND GO:  Was the test performed?  {AMBTIMEDUPGO:[phone]}  Cognitive Function: {CognitiveScreening:32337}    06/13/2021    9:12 AM  MMSE - Mini Mental State Exam  Orientation to time 5  Orientation to Place 5  Registration 3  Attention/ Calculation 5  Recall 3  Language- repeat 1        06/24/2023    2:30 PM  6CIT Screen  What Year? 0 points  What month? 0 points  What time? 0 points  Count back from 20 0 points  Months in reverse 0 points  Repeat phrase 4 points  Total Score 4 points    Immunizations Immunization History  Administered Date(s) Administered   Fluad Quad(high Dose 65+) 03/10/2021   H1N1 02/03/2009   Influenza Split 10/30/2011, 10/02/2012, 11/30/2013, 11/30/2017  Influenza Whole 10/18/2009   Influenza, High Dose Seasonal PF 11/30/2016, 11/12/2018, 10/03/2019   Influenza,inj,Quad PF,6+ Mos 01/28/2015, 01/30/2016   PFIZER(Purple Top)SARS-COV-2 Vaccination 02/14/2020, 03/08/2020, 11/18/2020   PNEUMOCOCCAL CONJUGATE-20 07/12/2023   Pneumococcal Conjugate-13 08/13/2017   Pneumococcal Polysaccharide-23 10/01/1999, 11/17/2009   Td 10/01/1999, 05/16/2010    Screening Tests Health Maintenance  Topic Date Due   COVID-19 Vaccine (4 - 2024-25 season) 09/01/2023   OPHTHALMOLOGY EXAM  11/06/2023   FOOT EXAM  05/07/2024   DTaP/Tdap/Td (3 - Tdap) 02/27/2025 (Originally 05/16/2020)   INFLUENZA VACCINE   07/31/2024   HEMOGLOBIN A1C  09/01/2024   Pneumococcal Vaccine: 50+ Years  Completed   Hepatitis B Vaccines  Aged Out   HPV VACCINES  Aged Out   Meningococcal B Vaccine  Aged Out   Colonoscopy  Discontinued   Hepatitis C Screening  Discontinued   Zoster Vaccines- Shingrix  Discontinued    Health Maintenance  Health Maintenance Due  Topic Date Due   COVID-19 Vaccine (4 - 2024-25 season) 09/01/2023   OPHTHALMOLOGY EXAM  11/06/2023   FOOT EXAM  05/07/2024   Health Maintenance Items Addressed: {HMMCR (Optional):30011}  Additional Screening:  Vision Screening: Recommended annual ophthalmology exams for early detection of glaucoma and other disorders of the eye. Would you like a referral to an eye doctor? {YES/NO:21197}   Dental Screening: Recommended annual dental exams for proper oral hygiene  Community Resource Referral / Chronic Care Management: CRR required this visit?  {YES/NO:21197}  CCM required this visit?  {CCM Required [phone]}   Plan:    I have personally reviewed and noted the following in the patient's chart:   Medical and social history Use of alcohol, tobacco or illicit drugs  Current medications and supplements including opioid prescriptions. {Opioid Prescriptions:[phone]} Functional ability and status Nutritional status Physical activity Advanced directives List of other physicians Hospitalizations, surgeries, and ER visits in previous 12 months Vitals Screenings to include cognitive, depression, and falls Referrals and appointments  In addition, I have reviewed and discussed with patient certain preventive protocols, quality metrics, and best practice recommendations. A written personalized care plan for preventive services as well as general preventive health recommendations were provided to patient.   Erminio LITTIE Saris, LPN   01/31/7973   After Visit Summary: {CHL AMB AWV After Visit [phone]}  Notes: {Nurse  Notes:32343}

## 2024-07-31 ENCOUNTER — Encounter: Payer: Self-pay | Admitting: Hematology and Oncology

## 2024-08-03 ENCOUNTER — Encounter: Payer: Self-pay | Admitting: Hematology and Oncology

## 2024-08-04 ENCOUNTER — Encounter: Admitting: Primary Care

## 2024-08-04 ENCOUNTER — Encounter: Payer: Self-pay | Admitting: Hematology and Oncology

## 2024-08-05 ENCOUNTER — Telehealth: Payer: Self-pay | Admitting: Hematology and Oncology

## 2024-08-06 ENCOUNTER — Encounter: Payer: Self-pay | Admitting: Hematology and Oncology

## 2024-08-13 ENCOUNTER — Inpatient Hospital Stay: Attending: Hematology and Oncology

## 2024-08-13 LAB — CMP (CANCER CENTER ONLY)
ALT: 14 U/L (ref 0–44)
AST: 19 U/L (ref 15–41)
Albumin: 3.6 g/dL (ref 3.5–5.0)
Alkaline Phosphatase: 113 U/L (ref 38–126)
Anion gap: 5 (ref 5–15)
BUN: 19 mg/dL (ref 8–23)
CO2: 32 mmol/L (ref 22–32)
Calcium: 8.7 mg/dL — ABNORMAL LOW (ref 8.9–10.3)
Chloride: 104 mmol/L (ref 98–111)
Creatinine: 1.01 mg/dL (ref 0.61–1.24)
GFR, Estimated: >60 mL/min (ref >60)
Glucose, Bld: 141 mg/dL — ABNORMAL HIGH (ref 70–99)
Potassium: 3.8 mmol/L (ref 3.5–5.1)
Sodium: 141 mmol/L (ref 135–145)
Total Bilirubin: 0.6 mg/dL (ref 0.0–1.2)
Total Protein: 6.6 g/dL (ref 6.5–8.1)

## 2024-08-13 LAB — CBC WITH DIFFERENTIAL (CANCER CENTER ONLY)
Abs Immature Granulocytes: 0.03 K/uL (ref 0.00–0.07)
Basophils Absolute: 0.1 K/uL (ref 0.0–0.1)
Basophils Relative: 1 %
Eosinophils Absolute: 0.2 K/uL (ref 0.0–0.5)
Eosinophils Relative: 3 %
HCT: 46.2 % (ref 39.0–52.0)
Hemoglobin: 16 g/dL (ref 13.0–17.0)
Immature Granulocytes: 0 %
Lymphocytes Relative: 19 %
Lymphs Abs: 1.4 K/uL (ref 0.7–4.0)
MCH: 35 pg — ABNORMAL HIGH (ref 26.0–34.0)
MCHC: 34.6 g/dL (ref 30.0–36.0)
MCV: 101.1 fL — ABNORMAL HIGH (ref 80.0–100.0)
Monocytes Absolute: 0.5 K/uL (ref 0.1–1.0)
Monocytes Relative: 7 %
Neutro Abs: 5.2 K/uL (ref 1.7–7.7)
Neutrophils Relative %: 70 %
Platelet Count: 248 K/uL (ref 150–400)
RBC: 4.57 MIL/uL (ref 4.22–5.81)
RDW: 13.7 % (ref 11.5–15.5)
WBC Count: 7.4 K/uL (ref 4.0–10.5)
nRBC: 0 % (ref 0.0–0.2)

## 2024-08-13 LAB — FERRITIN: Ferritin: 479 ng/mL — ABNORMAL HIGH (ref 24–336)

## 2024-08-17 ENCOUNTER — Ambulatory Visit (INDEPENDENT_AMBULATORY_CARE_PROVIDER_SITE_OTHER)

## 2024-08-17 VITALS — Ht 72.0 in | Wt 180.0 lb

## 2024-08-17 DIAGNOSIS — Z0000 Encounter for general adult medical examination without abnormal findings: Secondary | ICD-10-CM | POA: Diagnosis not present

## 2024-08-17 NOTE — Progress Notes (Signed)
 Because this visit was a virtual/telehealth visit,  certain criteria was not obtained, such a blood pressure, CBG if applicable, and timed get up and go. Any medications not marked as taking were not mentioned during the medication reconciliation part of the visit. Any vitals not documented were not able to be obtained due to this being a telehealth visit or patient was unable to self-report a recent blood pressure reading due to a lack of equipment at home via telehealth. Vitals that have been documented are verbally provided by the patient.  This visit was performed by a medical professional under my direct supervision. I was immediately available for consultation/collaboration. I have reviewed and agree with the Annual Wellness Visit documentation.  Subjective:   [name] [name] is a 83 y.o. who presents for a Medicare Wellness preventive visit.  As a reminder, Annual Wellness Visits don't include a physical exam, and some assessments may be limited, especially if this visit is performed virtually. We may recommend an in-person follow-up visit with your provider if needed.  Visit Complete: Virtual I connected with  [name] [name] on 08/17/24 by a audio enabled telemedicine application and verified that I am speaking with the correct person using two identifiers.  Patient Location: Home  Provider Location: Home Office  I discussed the limitations of evaluation and management by telemedicine. The patient expressed understanding and agreed to proceed.  Vital Signs: Because this visit was a virtual/telehealth visit, some criteria may be missing or patient reported. Any vitals not documented were not able to be obtained and vitals that have been documented are patient reported.  VideoDeclined- This patient declined Librarian, academic. Therefore the visit was completed with audio only.  Persons Participating in Visit: Patient.  AWV Questionnaire: No: Patient  Medicare AWV questionnaire was not completed prior to this visit.  Cardiac Risk Factors include: advanced age (>51men, >90 women);male gender;hypertension     Objective:    Today's Vitals   08/17/24 1502  Weight: 180 lb (81.6 kg)  Height: 6' (1.829 m)   Body mass index is 24.41 kg/m.     08/17/2024    3:10 PM 07/05/2024   11:33 AM 03/02/2024    5:00 PM 02/28/2024   10:01 PM 06/24/2023    2:27 PM 02/04/2023   10:39 AM 09/06/2022   10:12 AM  Advanced Directives  Does Patient Have a Medical Advance Directive? No Unable to assess, patient is non-responsive or altered mental status No No No No No  Would patient like information on creating a medical advance directive? No - Patient declined  Yes (Inpatient - patient requests chaplain consult to create a medical advance directive)  No - Patient declined      Current Medications (verified) Outpatient Encounter Medications as of 08/17/2024  Medication Sig   atorvastatin  (LIPITOR) 10 MG tablet TAKE 1 TABLET BY MOUTH EVERY DAY FOR CHOLESTEROL   cholecalciferol (VITAMIN D3) 25 MCG (1000 UNIT) tablet Take 1,000 Units by mouth daily.   clopidogrel  (PLAVIX ) 75 MG tablet TAKE 1 TABLET BY MOUTH EVERY DAY   cyanocobalamin  (VITAMIN B12) 500 MCG tablet Take 500 mcg by mouth daily.   furosemide  (LASIX ) 20 MG tablet Take 20 mg by mouth daily as needed for edema.   glucose blood (ACCU-CHEK GUIDE) test strip Use as instructed   losartan  (COZAAR ) 25 MG tablet Take 25 mg by mouth daily.   tamsulosin  (FLOMAX ) 0.4 MG CAPS capsule Take 1 capsule (0.4 mg total) by mouth daily.   No  facility-administered encounter medications on file as of 08/17/2024.    Allergies (verified) Niacin and Ativan  [lorazepam ]   History: Past Medical History:  Diagnosis Date   Aspiration pneumonia vs. CAP 02/28/2022   Basal cell carcinoma 11/09/2019   nod & infil-behind right ear-cx3 &exc   Basal cell carcinoma 03/21/2020   Residual BCC with peripheral margin involved - ST  recommends MOHs   Carotid artery occlusion    Coronary artery disease    s/p CABG 2005; sees Dr Delford yearly   Diabetes mellitus without complication (HCC)    Gynecomastia    History of COVID-19 01/08/2022   Hypercholesterolemia    Hypertension    Neurodermatitis    Overweight(278.02)    Personal history of colonic polyps 10/08/2006   tubular adenomas   Renal insufficiency    Stroke [facility])    TIA  April 2022   Transient ischemic attack 2010   lasted ~ 5 seconds   Vitamin D  deficiency    Past Surgical History:  Procedure Laterality Date   CARDIAC CATHETERIZATION  02/2004   tried to stent; couldn't   CAROTID ENDARTERECTOMY Right 08/26/2015   COLONOSCOPY     CORONARY ANGIOPLASTY     CORONARY ARTERY BYPASS GRAFT  Feb. 2005   4 vessel   ENDARTERECTOMY Right 08/26/2015   Procedure: Right Carotid ENDARTERECTOMY with Patch Angioplasty ;  Surgeon: Krystal JULIANNA Doing, MD;  Location: [facility] OR;  Service: Vascular;  Laterality: Right;   ENDARTERECTOMY Left 05/18/2021   Procedure: LEFT CAROTID ARTERY ENDARTERECTOMY  with patch angioplasty;  Surgeon: Doing Krystal JULIANNA, MD;  Location: [facility] OR;  Service: Vascular;  Laterality: Left;   EYE SURGERY     bilateral cataract   KELOID EXCISION  04/2008   on chest scar; Dr. Mercer   KELOID EXCISION     PILONIDAL CYST EXCISION  1989   Family History  Problem Relation Age of Onset   Heart disease Mother        Before age 35   Diabetes Mother    Kidney disease Mother    Heart attack Mother 16   Lung cancer Father 34   Diabetes Brother    Heart disease Brother    Heart disease Brother    Arthritis Brother    Diabetes Sister    Fibromyalgia Sister    Lung cancer Paternal Uncle        questionable as to if it was lung ca   Healthy Daughter    Colon cancer Neg Hx    Stroke Neg Hx    Social History   Socioeconomic History   Marital status: Married    Spouse name: [name]   Number of children: 1   Years of education: Not on file   Highest education  level: Some college, no degree  Occupational History   Occupation: retired  Tobacco Use   Smoking status: Never   Smokeless tobacco: Former  Building services engineer status: Never Used  Substance and Sexual Activity   Alcohol use: No    Alcohol/week: 0.0 standard drinks of alcohol   Drug use: No   Sexual activity: Yes  Other Topics Concern   Not on file  Social History Narrative   Retired.   Once worked for the [employer].   Married.   Enjoys reading, spending time with family.    Left handed   Drinks caffeine   One story home   Social Drivers of Health   Financial Resource Strain: Low Risk  (08/17/2024)  Overall Financial Resource Strain (CARDIA)    Difficulty of Paying Living Expenses: Not hard at all  Food Insecurity: Patient Unable To Answer (08/17/2024)   Hunger Vital Sign    Worried About Running Out of Food in the Last Year: Patient unable to answer    Ran Out of Food in the Last Year: Patient unable to answer  Transportation Needs: No Transportation Needs (08/17/2024)   PRAPARE - Administrator, Civil Service (Medical): No    Lack of Transportation (Non-Medical): No  Physical Activity: Inactive (08/17/2024)   Exercise Vital Sign    Days of Exercise per Week: 0 days    Minutes of Exercise per Session: 0 min  Stress: No Stress Concern Present (08/17/2024)   [employer] of Occupational Health - Occupational Stress Questionnaire    Feeling of Stress: Not at all  Social Connections: Moderately Isolated (08/17/2024)   Social Connection and Isolation Panel    Frequency of Communication with Friends and Family: More than three times a week    Frequency of Social Gatherings with Friends and Family: Never    Attends Religious Services: Never    Database administrator or Organizations: No    Attends Engineer, structural: Never    Marital Status: Married    Tobacco Counseling Counseling given: Not Answered    Clinical Intake:  Pre-visit preparation  completed: Yes  Pain : No/denies pain     BMI - recorded: 24.41 Nutritional Status: BMI of 19-24  Normal Nutritional Risks: None Diabetes: No  Lab Results  Component Value Date   HGBA1C 6.6 (H) 03/01/2024   HGBA1C 7.2 (H) 05/29/2023   HGBA1C 6.4 07/10/2022     How often do you need to have someone help you when you read instructions, pamphlets, or other written materials from your doctor or pharmacy?: 1 - Never  Interpreter Needed?: No  Information entered by :: Pravin Perezperez whitfields,CMA   Activities of Daily Living     08/17/2024    3:07 PM 03/02/2024    4:00 PM  In your present state of health, do you have any difficulty performing the following activities:  Hearing? 0 1  Vision? 0 0  Difficulty concentrating or making decisions? 0 0  Walking or climbing stairs? 1   Dressing or bathing? 0   Doing errands, shopping? 0 0  Preparing Food and eating ? N   Using the Toilet? N   In the past six months, have you accidently leaked urine? N   Do you have problems with loss of bowel control? N   Managing your Medications? N   Managing your Finances? N   Housekeeping or managing your Housekeeping? N     Patient Care Team: Gretta Comer POUR, NP as PCP - General (Internal Medicine) Delford Maude BROCKS, MD as PCP - Cardiology (Cardiology) Delford Maude BROCKS, MD as Attending Physician (Cardiology) Skeet Juliene SAUNDERS, DO as Consulting Physician (Neurology) Ermalinda Lenn HERO, LCSW as [facility] [place], [place], [place]  I have updated your Care Teams any recent Medical Services you may have received from other providers in the past year.     Assessment:   This is a routine wellness examination for Brendon.  Hearing/Vision screen Hearing Screening - Comments:: Patient wears hearing aids  Vision Screening - Comments:: Patient wears readers   Goals Addressed             This Visit's Progress    Patient Stated   On track  No new goals       Depression Screen      08/17/2024    3:11 PM 02/28/2024    3:30 PM 07/12/2023   10:39 AM 06/24/2023    2:24 PM 02/12/2023   12:04 PM 06/14/2022    8:58 AM 04/20/2022    4:07 PM  PHQ 2/9 Scores  PHQ - 2 Score 1 1 5  0 0 0 0  PHQ- 9 Score 1 10 21  3  0 0  Exception Documentation   Other- indicate reason in comment box      Not completed   Wife filled out        Fall Risk     08/17/2024    3:09 PM 02/28/2024    3:30 PM 07/12/2023   10:41 AM 06/24/2023    2:19 PM 02/12/2023   12:03 PM  Fall Risk   Falls in the past year? 1 1 1  0 0  Number falls in past yr: 0 0 1 1 0  Comment    fell going to bathroom   Injury with Fall? 0 0 0 0 0  Risk for fall due to : History of fall(s) No Fall Risks History of fall(s);Other (Comment) Impaired balance/gait;Impaired mobility No Fall Risks  Risk for fall due to: Comment   in a wheelchair    Follow up Falls evaluation completed;Education provided;Falls prevention discussed Falls evaluation completed Falls evaluation completed Falls prevention discussed;Falls evaluation completed Falls evaluation completed    MEDICARE RISK AT HOME:  Medicare Risk at Home Any stairs in or around the home?: Yes If so, are there any without handrails?: No Home free of loose throw rugs in walkways, pet beds, electrical cords, etc?: Yes Adequate lighting in your home to reduce risk of falls?: Yes Life alert?: No Use of a cane, walker or w/c?: No Grab bars in the bathroom?: Yes Shower chair or bench in shower?: Yes Elevated toilet seat or a handicapped toilet?: Yes  TIMED UP AND GO:  Was the test performed?  No  Cognitive Function: 6CIT completed    06/13/2021    9:12 AM  MMSE - Mini Mental State Exam  Orientation to time 5  Orientation to Place 5  Registration 3  Attention/ Calculation 5  Recall 3  Language- repeat 1        08/17/2024    3:05 PM 06/24/2023    2:30 PM  6CIT Screen  What Year? 0 points 0 points  What month? 0 points 0 points  What time? 0 points 0 points  Count back  from 20 0 points 0 points  Months in reverse 0 points 0 points  Repeat phrase 0 points 4 points  Total Score 0 points 4 points    Immunizations Immunization History  Administered Date(s) Administered   Fluad Quad(high Dose 65+) 03/10/2021   H1N1 02/03/2009   Influenza Split 10/30/2011, 10/02/2012, 11/30/2013, 11/30/2017   Influenza Whole 10/18/2009   Influenza, High Dose Seasonal PF 11/30/2016, 11/12/2018, 10/03/2019   Influenza,inj,Quad PF,6+ Mos 01/28/2015, 01/30/2016   PFIZER(Purple Top)SARS-COV-2 Vaccination 02/14/2020, 03/08/2020, 11/18/2020   PNEUMOCOCCAL CONJUGATE-20 07/12/2023   Pneumococcal Conjugate-13 08/13/2017   Pneumococcal Polysaccharide-23 10/01/1999, 11/17/2009   Td 10/01/1999, 05/16/2010    Screening Tests Health Maintenance  Topic Date Due   COVID-19 Vaccine (4 - 2024-25 season) 09/01/2023   OPHTHALMOLOGY EXAM  11/06/2023   FOOT EXAM  05/07/2024   INFLUENZA VACCINE  07/31/2024   DTaP/Tdap/Td (3 - Tdap) 02/27/2025 (Originally 05/16/2020)   HEMOGLOBIN A1C  09/01/2024   Pneumococcal Vaccine: 50+ Years  Completed   HPV VACCINES  Aged Out   Meningococcal B Vaccine  Aged Out   Pneumococcal Vaccine  Discontinued   Colonoscopy  Discontinued   Hepatitis C Screening  Discontinued   Zoster Vaccines- Shingrix  Discontinued    Health Maintenance  Health Maintenance Due  Topic Date Due   COVID-19 Vaccine (4 - 2024-25 season) 09/01/2023   OPHTHALMOLOGY EXAM  11/06/2023   FOOT EXAM  05/07/2024   INFLUENZA VACCINE  07/31/2024   Health Maintenance Items Addressed:   Additional Screening:  Vision Screening: Recommended annual ophthalmology exams for early detection of glaucoma and other disorders of the eye. Would you like a referral to an eye doctor? No    Dental Screening: Recommended annual dental exams for proper oral hygiene  Community Resource Referral / Chronic Care Management: CRR required this visit?  No   CCM required this visit?  No   Plan:     I have personally reviewed and noted the following in the patient's chart:   Medical and social history Use of alcohol, tobacco or illicit drugs  Current medications and supplements including opioid prescriptions. Patient is not currently taking opioid prescriptions. Functional ability and status Nutritional status Physical activity Advanced directives List of other physicians Hospitalizations, surgeries, and ER visits in previous 12 months Vitals Screenings to include cognitive, depression, and falls Referrals and appointments  In addition, I have reviewed and discussed with patient certain preventive protocols, quality metrics, and best practice recommendations. A written personalized care plan for preventive services as well as general preventive health recommendations were provided to patient.   Lyle MARLA Right, [place]   08/17/2024   After Visit Summary: (MyChart) Due to this being a telephonic visit, the after visit summary with patients personalized plan was offered to patient via MyChart   Notes: Nothing significant to report at this time.

## 2024-08-17 NOTE — Patient Instructions (Signed)
 [name] , Thank you for taking time out of your busy schedule to complete your Annual Wellness Visit with me. I enjoyed our conversation and look forward to speaking with you again next year. I, as well as your care team,  appreciate your ongoing commitment to your health goals. Please review the following plan we discussed and let me know if I can assist you in the future. Your Game plan/ To Do List    Referrals: If you haven't heard from the office you've been referred to, please reach out to them at the phone provided.   Follow up Visits: We will see or speak with you next year for your Next Medicare AWV with our clinical staff Have you seen your provider in the last 6 months (3 months if uncontrolled diabetes)? Yes  Clinician Recommendations:  Aim for 30 minutes of exercise or brisk walking, 6-8 glasses of water, and 5 servings of fruits and vegetables each day.       This is a list of the screenings recommended for you:  Health Maintenance  Topic Date Due   COVID-19 Vaccine (4 - 2024-25 season) 09/01/2023   Eye exam for diabetics  11/06/2023   Complete foot exam   05/07/2024   Flu Shot  07/31/2024   DTaP/Tdap/Td vaccine (3 - Tdap) 02/27/2025*   Hemoglobin A1C  09/01/2024   Pneumococcal Vaccine for age over 33  Completed   HPV Vaccine  Aged Out   Meningitis B Vaccine  Aged Out   Pneumococcal Vaccine  Discontinued   Colon Cancer Screening  Discontinued   Hepatitis C Screening  Discontinued   Zoster (Shingles) Vaccine  Discontinued  *Topic was postponed. The date shown is not the original due date.    Advanced directives: (Declined) Advance directive discussed with you today. Even though you declined this today, please call our office should you change your mind, and we can give you the proper paperwork for you to fill out. Advance Care Planning is important because it:  [x]  Makes sure you receive the medical care that is consistent with your values, goals, and  preferences  [x]  It provides guidance to your family and loved ones and reduces their decisional burden about whether or not they are making the right decisions based on your wishes.  Follow the link provided in your after visit summary or read over the paperwork we have mailed to you to help you started getting your Advance Directives in place. If you need assistance in completing these, please reach out to us  so that we can help you!  See attachments for Preventive Care and Fall Prevention Tips.

## 2024-08-26 ENCOUNTER — Encounter: Payer: Self-pay | Admitting: Hematology and Oncology

## 2024-08-28 ENCOUNTER — Telehealth: Payer: Self-pay | Admitting: *Deleted

## 2024-08-28 NOTE — Telephone Encounter (Signed)
 Received call from pt's wife. She is concerned that pt's Ferritin is now over 400. She is asking about what the next steps are. Message sent to Dr. Federico and [place] police, PA-C

## 2024-09-03 ENCOUNTER — Telehealth: Payer: Self-pay | Admitting: *Deleted

## 2024-09-03 NOTE — Telephone Encounter (Signed)
 Received vm message form pt's wife about pt's lab results and phlebotomies. TCT her and spoke with her.  She is asking about phlebotomies for her husband as his Ferritin is somewhat elevated. However, she states he is under Hospice Care and has gotten quite weak, cannot walk anymore and she is concerned that the phlebotomies would make him weaker. At the same time, she is wondering how serious this elevated iron problem is.  Advised that that his elevated Ferritin is a non-life threatening medical issue and any effects from iron build up would only occur over years not weeks or months. Advised that Dr. Federico  does not believe it is of any benefit to patient at this time, considering he is with Hospice. Pt's Hospice diagnosis is not related to to his elevated iron issues. Dr. Federico would also be concerned that these phlebotomies may make him feel weaker and not necessarily improve his overall comfort but may worsen how he feels. [name] [name] is in agreement to not do the labs or the phlebotomies and just do the things that will make [name] more comfortable.  Appointments cancelled

## 2024-09-07 ENCOUNTER — Telehealth: Payer: Self-pay

## 2024-09-07 DIAGNOSIS — R2681 Unsteadiness on feet: Secondary | ICD-10-CM

## 2024-09-07 DIAGNOSIS — R296 Repeated falls: Secondary | ICD-10-CM

## 2024-09-07 DIAGNOSIS — I639 Cerebral infarction, unspecified: Secondary | ICD-10-CM

## 2024-09-07 DIAGNOSIS — R531 Weakness: Secondary | ICD-10-CM

## 2024-09-07 NOTE — Telephone Encounter (Signed)
 Copied from CRM [id number]. Topic: Clinical - Order For Equipment >> Sep 07, 2024 10:13 AM Franky GRADE wrote: Reason for CRM: Patient's spouse is calling to request a Vive stand assist lift to be sent to [facility]. She is the care giver for the patient and it would help a lot if they were able to get this. Fax number for [facility] is [phone].

## 2024-09-08 NOTE — Telephone Encounter (Signed)
Unable to reach patients spouse. Left voicemail to return call to our office.

## 2024-09-08 NOTE — Telephone Encounter (Signed)
 I am happy to help but since he is on hospice they may be able to get the lift  much sooner.  Has she reached out to hospice?

## 2024-09-09 NOTE — Addendum Note (Signed)
 Addended by: Taneya Conkel K on: 09/09/2024 05:50 PM   Modules accepted: Orders

## 2024-09-09 NOTE — Telephone Encounter (Signed)
 DME order placed. Form signed and placed in Kelli's inbox.

## 2024-09-09 NOTE — Telephone Encounter (Signed)
 Spoke with patients spouse, she has asked hospice they do not have stand assist lifts only hoyer lifts.  She is requesting order be sent to [facility] for the stand assist lift.

## 2024-09-10 NOTE — Telephone Encounter (Signed)
 Community message sent to [facility] notifying of DME order.

## 2024-09-14 DIAGNOSIS — M7552 Bursitis of left shoulder: Secondary | ICD-10-CM | POA: Diagnosis not present

## 2024-09-18 ENCOUNTER — Other Ambulatory Visit

## 2024-09-18 ENCOUNTER — Ambulatory Visit: Admitting: Physician Assistant

## 2024-09-18 ENCOUNTER — Encounter

## 2024-10-01 ENCOUNTER — Telehealth: Payer: Self-pay

## 2024-10-01 NOTE — Telephone Encounter (Signed)
 Copied from CRM [id number]. Topic: Clinical - Order For Equipment >> Oct 01, 2024  2:57 PM Rea C wrote: Reason for CRM: Patient's wife [name] called [employer] [facility] and they stated that there's a pause on the patient receiving the lift because they are in need of more information in reagrds to patients condition/diagnosis/need for the lift. Patient's wife concern is safety for patient and her as his caregiver. [facility] is in need of more documentation for that. Patient's wife thinks it is probably more so having to do with insurance and them asking for his need for it.   For NP [name] Gaskins,  [facility] requests the need for more documentation in regards to request for Vive stand assist lift.  [phone] Fax number for [facility]  [phone] (Patient's wife [name] [name].)

## 2024-10-01 NOTE — Telephone Encounter (Signed)
 Community message sent to [facility] team to find out the issue with the order.

## 2024-10-01 NOTE — Telephone Encounter (Signed)
 [name] [name] [name] [name], CMA; [name] Avelina; Joylene Cain; [name], [name] A narrative in face to face/ OV notes is what's needed. The only thing we seen was a phone encounter with the patients wife asking for one and insurance would not cover it, that way.

## 2024-10-01 NOTE — Telephone Encounter (Signed)
 Called and advised patient wife of the message received from [facility] team. Scheduled patient virtual face to face visit on 10/09 with [name].

## 2024-10-07 ENCOUNTER — Encounter: Payer: Self-pay | Admitting: Hematology and Oncology

## 2024-10-08 ENCOUNTER — Encounter: Payer: Self-pay | Admitting: Primary Care

## 2024-10-08 ENCOUNTER — Telehealth: Admitting: Primary Care

## 2024-10-08 DIAGNOSIS — R531 Weakness: Secondary | ICD-10-CM

## 2024-10-08 DIAGNOSIS — I639 Cerebral infarction, unspecified: Secondary | ICD-10-CM

## 2024-10-08 NOTE — Assessment & Plan Note (Signed)
 Agree that he qualifies for a stand-up lift.  The left will help to support family who has been lifting him thus far. The lift will also prevent falls and help with circulation and prevent pressure ulcers.  Will fax office notes today.

## 2024-10-08 NOTE — Progress Notes (Signed)
 Patient ID: [name] [name], male    DOB: 1941-08-20, 83 y.o.   MRN: [id number]  Virtual visit completed through [employer], a video enabled telemedicine application. Due to national recommendations of social distancing due to COVID-19, a virtual visit is felt to be most appropriate for this patient at this time. Reviewed limitations, risks, security and privacy concerns of performing a virtual visit and the availability of in person appointments. I also reviewed that there may be a patient responsible charge related to this service. The patient agreed to proceed.   Patient location: home Provider location: [place] at [facility], office Persons participating in this virtual visit: patient, provider   If any vitals were documented, they were collected by patient at home unless specified below.    There were no vitals taken for this visit.   CC: Face to Face visit for Lift Subjective:   HPI: [name] [name] is a 83 y.o. male with a history of recurrent strokes, hypertension, CAD, CHF, type 2 diabetes, generalized weakness, unsteady gait presenting on 10/08/2024 for Medical Management of Chronic Issues (Pt needed face to face visit in order for medicare to cover Lift that was placed through DME order. )  An order was placed for a stand-up lift several weeks ago.  His insurance is requiring a face-to-face visit for approval.  He is needing a stand up lift due to chronic and progressive weakness to his lower extremities from his multiple strokes. He is chair bound, can only pivot when standing. He's had several near falls. His wife has multiple thoracic compression fractures and cannot lift or help move him. The lift would help to move him to and from different locations.        Relevant past medical, surgical, family and social history reviewed and updated as indicated. Interim medical history since our last visit reviewed. Allergies and medications reviewed and updated. Outpatient  Medications Prior to Visit  Medication Sig Dispense Refill   cholecalciferol (VITAMIN D3) 25 MCG (1000 UNIT) tablet Take 1,000 Units by mouth daily.     clopidogrel  (PLAVIX ) 75 MG tablet TAKE 1 TABLET BY MOUTH EVERY DAY 90 tablet 3   cyanocobalamin  (VITAMIN B12) 500 MCG tablet Take 500 mcg by mouth daily.     atorvastatin  (LIPITOR) 10 MG tablet TAKE 1 TABLET BY MOUTH EVERY DAY FOR CHOLESTEROL (Patient not taking: Reported on 10/08/2024) 90 tablet 0   furosemide  (LASIX ) 20 MG tablet Take 20 mg by mouth daily as needed for edema. (Patient not taking: Reported on 10/08/2024)     glucose blood (ACCU-CHEK GUIDE) test strip Use as instructed (Patient not taking: Reported on 10/08/2024) 100 strip 3   losartan  (COZAAR ) 25 MG tablet Take 25 mg by mouth daily. (Patient not taking: Reported on 10/08/2024)     tamsulosin  (FLOMAX ) 0.4 MG CAPS capsule Take 1 capsule (0.4 mg total) by mouth daily. (Patient not taking: Reported on 10/08/2024)     No facility-administered medications prior to visit.     Per HPI unless specifically indicated in ROS section below Review of Systems  Respiratory:  Negative for shortness of breath.   Cardiovascular:  Negative for chest pain.  Neurological:  Positive for weakness.   Objective:  There were no vitals taken for this visit.  Wt Readings from Last 3 Encounters:  08/17/24 180 lb (81.6 kg)  07/05/24 208 lb (94.3 kg)  02/28/24 208 lb 15.9 oz (94.8 kg)       Physical exam: General: Alert and  oriented x 3, no distress, does not appear sickly  Pulmonary: Speaks in complete sentences without increased work of breathing, no cough during visit.  Psychiatric: Normal mood, thought content, and behavior.     Results for orders placed or performed in visit on 08/13/24  CMP (Cancer Center only)   Collection Time: 08/13/24 10:12 AM  Result Value Ref Range   Sodium 141 135 - 145 mmol/L   Potassium 3.8 3.5 - 5.1 mmol/L   Chloride 104 98 - 111 mmol/L   CO2 32 22 - 32  mmol/L   Glucose, Bld 141 (H) 70 - 99 mg/dL   BUN 19 8 - 23 mg/dL   Creatinine 8.98 9.38 - 1.24 mg/dL   Calcium  8.7 (L) 8.9 - 10.3 mg/dL   Total Protein 6.6 6.5 - 8.1 g/dL   Albumin 3.6 3.5 - 5.0 g/dL   AST 19 15 - 41 U/L   ALT 14 0 - 44 U/L   Alkaline Phosphatase 113 38 - 126 U/L   Total Bilirubin 0.6 0.0 - 1.2 mg/dL   GFR, Estimated >39 >39 mL/min   Anion gap 5 5 - 15  CBC with Differential (Cancer Center Only)   Collection Time: 08/13/24 10:12 AM  Result Value Ref Range   WBC Count 7.4 4.0 - 10.5 K/uL   RBC 4.57 4.22 - 5.81 MIL/uL   Hemoglobin 16.0 13.0 - 17.0 g/dL   HCT 53.7 60.9 - 47.9 %   MCV 101.1 (H) 80.0 - 100.0 fL   MCH 35.0 (H) 26.0 - 34.0 pg   MCHC 34.6 30.0 - 36.0 g/dL   RDW 86.2 88.4 - 84.4 %   Platelet Count 248 150 - 400 K/uL   nRBC 0.0 0.0 - 0.2 %   Neutrophils Relative % 70 %   Neutro Abs 5.2 1.7 - 7.7 K/uL   Lymphocytes Relative 19 %   Lymphs Abs 1.4 0.7 - 4.0 K/uL   Monocytes Relative 7 %   Monocytes Absolute 0.5 0.1 - 1.0 K/uL   Eosinophils Relative 3 %   Eosinophils Absolute 0.2 0.0 - 0.5 K/uL   Basophils Relative 1 %   Basophils Absolute 0.1 0.0 - 0.1 K/uL   Immature Granulocytes 0 %   Abs Immature Granulocytes 0.03 0.00 - 0.07 K/uL  Ferritin   Collection Time: 08/13/24 10:13 AM  Result Value Ref Range   Ferritin 479 (H) 24 - 336 ng/mL   Assessment & Plan:   Problem List Items Addressed This Visit       Cardiovascular and Mediastinum   Recurrent strokes (HCC) - Primary   Agree that he qualifies for a stand-up lift for reasons stated in HPI. Orders have already been received.  We will fax the office notes today.        Other   Generalized weakness   Agree that he qualifies for a stand-up lift.  The left will help to support family who has been lifting him thus far. The lift will also prevent falls and help with circulation and prevent pressure ulcers.  Will fax office notes today.        No orders of the defined types were  placed in this encounter.  No orders of the defined types were placed in this encounter.   I discussed the assessment and treatment plan with the patient. The patient was provided an opportunity to ask questions and all were answered. The patient agreed with the plan and demonstrated an understanding of the instructions. The patient  was advised to call back or seek an in-person evaluation if the symptoms worsen or if the condition fails to improve as anticipated.  Follow up plan:  We will fax the office notes today.  It was a pleasure to see you today!   Gergory Biello K Suesan Mohrmann, NP

## 2024-10-08 NOTE — Patient Instructions (Signed)
 We will fax the office notes today.  It was a pleasure to see you today!

## 2024-10-08 NOTE — Assessment & Plan Note (Signed)
 Agree that he qualifies for a stand-up lift for reasons stated in HPI. Orders have already been received.  We will fax the office notes today.

## 2024-10-20 NOTE — Telephone Encounter (Signed)
 Community message sent to [employer] [facility] team to inquire about issue.

## 2024-10-20 NOTE — Telephone Encounter (Signed)
 Copied from CRM #[id number]. Topic: Clinical - Order For Equipment >> Oct 19, 2024  4:59 PM Delon DASEN wrote: Reason for CRM: [employer] [facility] told Olena they need additional information, nothing is happening- possible issue with insurance- please call to follow up- [phone]

## 2024-10-20 NOTE — Telephone Encounter (Signed)
 Called and spoke with patients wife advised insurance will not cover the sit to stand assist lift. She declined having order placed for standard lift at this time.

## 2024-10-20 NOTE — Telephone Encounter (Signed)
Unable to reach patients wife. Left voicemail to return call to our office.

## 2024-10-20 NOTE — Telephone Encounter (Signed)
 [name] [name] [name] [name], [name] [name]; [name] [name]; [name], [name]; [name], [name] Per the notes on his account with us  it looks like insurance will not pay for the sit to stand. Order may need to be placed for a standard lift with lift sling.

## 2024-10-20 NOTE — Telephone Encounter (Unsigned)
 Copied from CRM #[id number]. Topic: General - Other >> Oct 20, 2024 10:32 AM Thersia BROCKS wrote: Reason for CRM: Patient wife, Olena called in regarding a missed call from [facility], would like a callback

## 2024-11-03 ENCOUNTER — Encounter: Payer: Self-pay | Admitting: Hematology and Oncology

## 2025-01-15 ENCOUNTER — Telehealth: Payer: Self-pay

## 2025-01-15 NOTE — Telephone Encounter (Signed)
 Copied from CRM [id number]. Topic: General - Other >> Jan 15, 2025 12:02 PM Deaijah H wrote: Reason for CRM: Patients wife called in wanting bloodwork done on patient for PSA but hospice will not pay for blood work on OV, but would like to know how much it would be or if medicare could still be billed. Cost for PSA alone and then all of the blood work that's done with the yearly physical. Please call [phone]

## 2025-01-18 NOTE — Telephone Encounter (Signed)
 Not sure whom can help with this since he's asking about billing/insurance stuff?

## 2025-01-18 NOTE — Telephone Encounter (Signed)
 He can call his [facility] Medicare plan to find out if anything is covered. Typically, we stop measuring PSA levels after the age of 75. Is there a reason he is wanting one done now?

## 2025-01-18 NOTE — Telephone Encounter (Signed)
 LMTCB; please relay message when call is returned.

## 2025-01-19 ENCOUNTER — Telehealth: Payer: Self-pay | Admitting: Primary Care

## 2025-01-19 NOTE — Telephone Encounter (Signed)
 Called patient wife states that patient is not having any symptoms and she will reach out to the insurance to get cost of lab. She will give us  a call if any further questions.

## 2025-01-19 NOTE — Telephone Encounter (Unsigned)
 Copied from CRM [id number]. Topic: General - Other >> Jan 15, 2025 12:02 PM [name] [name] wrote: Reason for CRM: Patients wife called in wanting bloodwork done on patient for PSA but hospice will not pay for blood work on OV, but would like to know how much it would be or if medicare could still be billed. Cost for PSA alone and then all of the blood work that's done with the yearly physical. Please call [phone]
